# Patient Record
Sex: Female | Born: 1945
Health system: Southern US, Community
[De-identification: ages and names within clinical notes are randomized; demographics above are authoritative.]

## PROBLEM LIST (undated history)

## (undated) DIAGNOSIS — E785 Hyperlipidemia, unspecified: Secondary | ICD-10-CM

## (undated) DIAGNOSIS — Z972 Presence of dental prosthetic device (complete) (partial): Secondary | ICD-10-CM

## (undated) DIAGNOSIS — J449 Chronic obstructive pulmonary disease, unspecified: Secondary | ICD-10-CM

## (undated) DIAGNOSIS — S12400A Unspecified displaced fracture of fifth cervical vertebra, initial encounter for closed fracture: Secondary | ICD-10-CM

## (undated) DIAGNOSIS — F419 Anxiety disorder, unspecified: Secondary | ICD-10-CM

## (undated) DIAGNOSIS — Z9289 Personal history of other medical treatment: Secondary | ICD-10-CM

## (undated) DIAGNOSIS — S22000A Wedge compression fracture of unspecified thoracic vertebra, initial encounter for closed fracture: Secondary | ICD-10-CM

## (undated) DIAGNOSIS — Z789 Other specified health status: Secondary | ICD-10-CM

## (undated) DIAGNOSIS — Z8619 Personal history of other infectious and parasitic diseases: Secondary | ICD-10-CM

## (undated) DIAGNOSIS — W19XXXA Unspecified fall, initial encounter: Secondary | ICD-10-CM

## (undated) DIAGNOSIS — F329 Major depressive disorder, single episode, unspecified: Secondary | ICD-10-CM

## (undated) DIAGNOSIS — I639 Cerebral infarction, unspecified: Secondary | ICD-10-CM

## (undated) DIAGNOSIS — F17200 Nicotine dependence, unspecified, uncomplicated: Secondary | ICD-10-CM

## (undated) DIAGNOSIS — M545 Low back pain, unspecified: Secondary | ICD-10-CM

## (undated) DIAGNOSIS — I729 Aneurysm of unspecified site: Secondary | ICD-10-CM

## (undated) DIAGNOSIS — I5030 Unspecified diastolic (congestive) heart failure: Secondary | ICD-10-CM

## (undated) DIAGNOSIS — I6529 Occlusion and stenosis of unspecified carotid artery: Secondary | ICD-10-CM

## (undated) DIAGNOSIS — G629 Polyneuropathy, unspecified: Secondary | ICD-10-CM

## (undated) DIAGNOSIS — M810 Age-related osteoporosis without current pathological fracture: Secondary | ICD-10-CM

## (undated) DIAGNOSIS — F32A Depression, unspecified: Secondary | ICD-10-CM

## (undated) DIAGNOSIS — I509 Heart failure, unspecified: Secondary | ICD-10-CM

## (undated) DIAGNOSIS — M1990 Unspecified osteoarthritis, unspecified site: Secondary | ICD-10-CM

## (undated) DIAGNOSIS — I350 Nonrheumatic aortic (valve) stenosis: Secondary | ICD-10-CM

## (undated) DIAGNOSIS — I10 Essential (primary) hypertension: Secondary | ICD-10-CM

## (undated) DIAGNOSIS — E039 Hypothyroidism, unspecified: Secondary | ICD-10-CM

## (undated) DIAGNOSIS — S060X9A Concussion with loss of consciousness of unspecified duration, initial encounter: Secondary | ICD-10-CM

## (undated) DIAGNOSIS — I739 Peripheral vascular disease, unspecified: Secondary | ICD-10-CM

## (undated) HISTORY — DX: Anxiety disorder, unspecified: F41.9

## (undated) HISTORY — DX: Age-related osteoporosis without current pathological fracture: M81.0

## (undated) HISTORY — DX: Depression, unspecified: F32.A

## (undated) HISTORY — DX: Occlusion and stenosis of unspecified carotid artery: I65.29

## (undated) HISTORY — DX: Nicotine dependence, unspecified, uncomplicated: F17.200

## (undated) HISTORY — DX: Concussion with loss of consciousness of unspecified duration, initial encounter: S06.0X9A

## (undated) HISTORY — DX: Personal history of other infectious and parasitic diseases: Z86.19

## (undated) HISTORY — PX: CATARACT EXTRACTION: SUR2

## (undated) HISTORY — DX: Unspecified diastolic (congestive) heart failure: I50.30

## (undated) HISTORY — DX: Low back pain: M54.5

## (undated) HISTORY — DX: Low back pain, unspecified: M54.50

## (undated) HISTORY — DX: Personal history of other medical treatment: Z92.89

## (undated) HISTORY — DX: Unspecified displaced fracture of fifth cervical vertebra, initial encounter for closed fracture: S12.400A

## (undated) HISTORY — DX: Major depressive disorder, single episode, unspecified: F32.9

## (undated) HISTORY — DX: Unspecified fall, initial encounter: W19.XXXA

## (undated) HISTORY — DX: Unspecified osteoarthritis, unspecified site: M19.90

---
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
## 1968-09-26 HISTORY — PX: CHOLECYSTECTOMY: SHX55

## 2001-03-08 ENCOUNTER — Encounter: Payer: Self-pay | Admitting: Emergency Medicine

## 2001-03-08 ENCOUNTER — Inpatient Hospital Stay (HOSPITAL_COMMUNITY): Admission: EM | Admit: 2001-03-08 | Discharge: 2001-03-10 | Payer: Self-pay | Admitting: Emergency Medicine

## 2001-04-16 ENCOUNTER — Emergency Department (HOSPITAL_COMMUNITY): Admission: EM | Admit: 2001-04-16 | Discharge: 2001-04-16 | Payer: Self-pay | Admitting: Emergency Medicine

## 2001-04-16 ENCOUNTER — Encounter: Payer: Self-pay | Admitting: Emergency Medicine

## 2001-12-02 ENCOUNTER — Emergency Department (HOSPITAL_COMMUNITY): Admission: EM | Admit: 2001-12-02 | Discharge: 2001-12-02 | Payer: Self-pay | Admitting: Emergency Medicine

## 2001-12-02 ENCOUNTER — Encounter: Payer: Self-pay | Admitting: Emergency Medicine

## 2001-12-18 ENCOUNTER — Encounter: Admission: RE | Admit: 2001-12-18 | Discharge: 2002-03-18 | Payer: Self-pay | Admitting: Orthopedic Surgery

## 2002-09-26 HISTORY — PX: COLONOSCOPY: SHX174

## 2002-12-18 ENCOUNTER — Encounter: Admission: RE | Admit: 2002-12-18 | Discharge: 2002-12-18 | Payer: Self-pay | Admitting: Family Medicine

## 2002-12-18 ENCOUNTER — Encounter: Payer: Self-pay | Admitting: Family Medicine

## 2004-02-18 ENCOUNTER — Emergency Department (HOSPITAL_COMMUNITY): Admission: EM | Admit: 2004-02-18 | Discharge: 2004-02-18 | Payer: Self-pay | Admitting: Emergency Medicine

## 2004-06-16 ENCOUNTER — Emergency Department (HOSPITAL_COMMUNITY): Admission: EM | Admit: 2004-06-16 | Discharge: 2004-06-16 | Payer: Self-pay | Admitting: *Deleted

## 2004-06-30 ENCOUNTER — Encounter: Admission: RE | Admit: 2004-06-30 | Discharge: 2004-06-30 | Payer: Self-pay | Admitting: Family Medicine

## 2004-09-26 HISTORY — PX: HIP SURGERY: SHX245

## 2005-08-08 ENCOUNTER — Encounter: Admission: RE | Admit: 2005-08-08 | Discharge: 2005-08-08 | Payer: Self-pay | Admitting: Orthopedic Surgery

## 2005-08-23 ENCOUNTER — Ambulatory Visit (HOSPITAL_COMMUNITY): Admission: RE | Admit: 2005-08-23 | Discharge: 2005-08-24 | Payer: Self-pay | Admitting: Orthopedic Surgery

## 2005-12-14 ENCOUNTER — Encounter: Admission: RE | Admit: 2005-12-14 | Discharge: 2005-12-14 | Payer: Self-pay | Admitting: Orthopedic Surgery

## 2005-12-29 ENCOUNTER — Encounter: Admission: RE | Admit: 2005-12-29 | Discharge: 2005-12-29 | Payer: Self-pay | Admitting: Orthopedic Surgery

## 2006-01-13 ENCOUNTER — Encounter: Admission: RE | Admit: 2006-01-13 | Discharge: 2006-01-13 | Payer: Self-pay | Admitting: Orthopedic Surgery

## 2006-02-18 ENCOUNTER — Ambulatory Visit: Payer: Self-pay | Admitting: Family Medicine

## 2006-02-18 ENCOUNTER — Inpatient Hospital Stay (HOSPITAL_COMMUNITY): Admission: EM | Admit: 2006-02-18 | Discharge: 2006-02-21 | Payer: Self-pay | Admitting: Emergency Medicine

## 2006-02-19 ENCOUNTER — Encounter (INDEPENDENT_AMBULATORY_CARE_PROVIDER_SITE_OTHER): Payer: Self-pay | Admitting: Cardiology

## 2006-02-24 ENCOUNTER — Ambulatory Visit (HOSPITAL_COMMUNITY): Admission: RE | Admit: 2006-02-24 | Discharge: 2006-02-24 | Payer: Self-pay | Admitting: Cardiology

## 2006-02-24 ENCOUNTER — Ambulatory Visit: Payer: Self-pay | Admitting: Cardiology

## 2006-02-24 ENCOUNTER — Encounter: Payer: Self-pay | Admitting: Cardiology

## 2007-02-25 ENCOUNTER — Inpatient Hospital Stay (HOSPITAL_COMMUNITY): Admission: EM | Admit: 2007-02-25 | Discharge: 2007-03-01 | Payer: Self-pay | Admitting: Emergency Medicine

## 2007-02-25 ENCOUNTER — Ambulatory Visit: Payer: Self-pay | Admitting: Cardiology

## 2007-02-27 ENCOUNTER — Encounter (INDEPENDENT_AMBULATORY_CARE_PROVIDER_SITE_OTHER): Payer: Self-pay | Admitting: *Deleted

## 2007-02-27 ENCOUNTER — Ambulatory Visit: Payer: Self-pay | Admitting: Vascular Surgery

## 2007-11-24 ENCOUNTER — Emergency Department (HOSPITAL_COMMUNITY): Admission: EM | Admit: 2007-11-24 | Discharge: 2007-11-24 | Payer: Self-pay | Admitting: Family Medicine

## 2008-01-29 ENCOUNTER — Emergency Department (HOSPITAL_COMMUNITY): Admission: EM | Admit: 2008-01-29 | Discharge: 2008-01-29 | Payer: Self-pay | Admitting: Family Medicine

## 2008-09-26 DIAGNOSIS — I639 Cerebral infarction, unspecified: Secondary | ICD-10-CM

## 2008-09-26 HISTORY — DX: Cerebral infarction, unspecified: I63.9

## 2008-09-26 LAB — HM COLONOSCOPY: HM Colonoscopy: NORMAL

## 2008-09-30 ENCOUNTER — Ambulatory Visit: Payer: Self-pay | Admitting: Cardiovascular Disease

## 2008-09-30 ENCOUNTER — Inpatient Hospital Stay (HOSPITAL_COMMUNITY): Admission: EM | Admit: 2008-09-30 | Discharge: 2008-10-03 | Payer: Self-pay | Admitting: Emergency Medicine

## 2008-10-01 ENCOUNTER — Encounter (INDEPENDENT_AMBULATORY_CARE_PROVIDER_SITE_OTHER): Payer: Self-pay | Admitting: Internal Medicine

## 2008-10-03 ENCOUNTER — Ambulatory Visit: Payer: Self-pay | Admitting: Vascular Surgery

## 2008-10-09 ENCOUNTER — Encounter: Payer: Self-pay | Admitting: Interventional Radiology

## 2008-10-15 ENCOUNTER — Ambulatory Visit: Payer: Self-pay | Admitting: Cardiology

## 2008-10-16 ENCOUNTER — Ambulatory Visit: Payer: Self-pay

## 2008-11-05 ENCOUNTER — Inpatient Hospital Stay (HOSPITAL_COMMUNITY): Admission: RE | Admit: 2008-11-05 | Discharge: 2008-11-06 | Payer: Self-pay | Admitting: Interventional Radiology

## 2008-11-25 ENCOUNTER — Encounter: Payer: Self-pay | Admitting: Interventional Radiology

## 2009-05-07 ENCOUNTER — Ambulatory Visit (HOSPITAL_COMMUNITY): Admission: RE | Admit: 2009-05-07 | Discharge: 2009-05-07 | Payer: Self-pay | Admitting: Interventional Radiology

## 2009-06-05 ENCOUNTER — Emergency Department (HOSPITAL_COMMUNITY): Admission: EM | Admit: 2009-06-05 | Discharge: 2009-06-05 | Payer: Self-pay | Admitting: Emergency Medicine

## 2009-07-28 ENCOUNTER — Encounter (INDEPENDENT_AMBULATORY_CARE_PROVIDER_SITE_OTHER): Payer: Self-pay | Admitting: *Deleted

## 2009-07-29 ENCOUNTER — Emergency Department (HOSPITAL_COMMUNITY): Admission: EM | Admit: 2009-07-29 | Discharge: 2009-07-29 | Payer: Self-pay | Admitting: Emergency Medicine

## 2009-12-23 ENCOUNTER — Ambulatory Visit (HOSPITAL_COMMUNITY): Admission: RE | Admit: 2009-12-23 | Discharge: 2009-12-23 | Payer: Self-pay | Admitting: Interventional Radiology

## 2010-10-18 ENCOUNTER — Encounter: Payer: Self-pay | Admitting: Interventional Radiology

## 2010-11-25 DIAGNOSIS — M810 Age-related osteoporosis without current pathological fracture: Secondary | ICD-10-CM

## 2010-11-25 HISTORY — DX: Age-related osteoporosis without current pathological fracture: M81.0

## 2010-11-25 HISTORY — PX: OTHER SURGICAL HISTORY: SHX169

## 2010-12-17 LAB — BASIC METABOLIC PANEL
BUN: 18 mg/dL (ref 6–23)
CO2: 28 mEq/L (ref 19–32)
Calcium: 9.3 mg/dL (ref 8.4–10.5)
Chloride: 109 mEq/L (ref 96–112)
Creatinine, Ser: 0.88 mg/dL (ref 0.4–1.2)
GFR calc Af Amer: >60 mL/min (ref >60)
GFR calc non Af Amer: >60 mL/min (ref >60)
Glucose, Bld: 112 mg/dL — ABNORMAL HIGH (ref 70–99)
Potassium: 4.6 mEq/L (ref 3.5–5.1)
Sodium: 140 mEq/L (ref 135–145)

## 2010-12-17 LAB — CBC
HCT: 43.9 % (ref 36.0–46.0)
Hemoglobin: 14.9 g/dL (ref 12.0–15.0)
MCHC: 34 g/dL (ref 30.0–36.0)
MCV: 89.7 fL (ref 78.0–100.0)
Platelets: 159 10*3/uL (ref 150–400)
RBC: 4.89 MIL/uL (ref 3.87–5.11)
RDW: 13.4 % (ref 11.5–15.5)
WBC: 9.2 10*3/uL (ref 4.0–10.5)

## 2010-12-17 LAB — APTT: aPTT: 28 seconds (ref 24–37)

## 2010-12-17 LAB — PROTIME-INR
INR: 0.93 (ref 0.00–1.49)
Prothrombin Time: 12.4 seconds (ref 11.6–15.2)

## 2010-12-31 LAB — DIFFERENTIAL
Basophils Absolute: 0 10*3/uL (ref 0.0–0.1)
Basophils Relative: 1 % (ref 0–1)
Eosinophils Absolute: 0.2 10*3/uL (ref 0.0–0.7)
Eosinophils Relative: 2 % (ref 0–5)
Lymphocytes Relative: 33 % (ref 12–46)
Lymphs Abs: 2.5 10*3/uL (ref 0.7–4.0)
Monocytes Absolute: 0.6 10*3/uL (ref 0.1–1.0)
Monocytes Relative: 9 % (ref 3–12)
Neutro Abs: 4.2 10*3/uL (ref 1.7–7.7)
Neutrophils Relative %: 56 % (ref 43–77)

## 2010-12-31 LAB — CBC
HCT: 39.3 % (ref 36.0–46.0)
Hemoglobin: 13.4 g/dL (ref 12.0–15.0)
MCHC: 34.2 g/dL (ref 30.0–36.0)
MCV: 90.3 fL (ref 78.0–100.0)
Platelets: 130 10*3/uL — ABNORMAL LOW (ref 150–400)
RBC: 4.36 MIL/uL (ref 3.87–5.11)
RDW: 13.4 % (ref 11.5–15.5)
WBC: 7.5 10*3/uL (ref 4.0–10.5)

## 2010-12-31 LAB — BASIC METABOLIC PANEL
BUN: 15 mg/dL (ref 6–23)
CO2: 27 mEq/L (ref 19–32)
Calcium: 9.5 mg/dL (ref 8.4–10.5)
Chloride: 107 mEq/L (ref 96–112)
Creatinine, Ser: 0.85 mg/dL (ref 0.4–1.2)
GFR calc Af Amer: >60 mL/min (ref >60)
GFR calc non Af Amer: >60 mL/min (ref >60)
Glucose, Bld: 99 mg/dL (ref 70–99)
Potassium: 3.9 mEq/L (ref 3.5–5.1)
Sodium: 141 mEq/L (ref 135–145)

## 2011-01-01 LAB — BASIC METABOLIC PANEL
BUN: 22 mg/dL (ref 6–23)
CO2: 27 mEq/L (ref 19–32)
Calcium: 9 mg/dL (ref 8.4–10.5)
Chloride: 107 mEq/L (ref 96–112)
Creatinine, Ser: 0.89 mg/dL (ref 0.4–1.2)
GFR calc Af Amer: >60 mL/min (ref >60)
GFR calc non Af Amer: >60 mL/min (ref >60)
Glucose, Bld: 95 mg/dL (ref 70–99)
Potassium: 4.7 mEq/L (ref 3.5–5.1)
Sodium: 139 mEq/L (ref 135–145)

## 2011-01-01 LAB — CBC
HCT: 40.5 % (ref 36.0–46.0)
Hemoglobin: 14.1 g/dL (ref 12.0–15.0)
MCHC: 34.9 g/dL (ref 30.0–36.0)
MCV: 88 fL (ref 78.0–100.0)
Platelets: DECREASED 10*3/uL (ref 150–400)
RBC: 4.6 MIL/uL (ref 3.87–5.11)
RDW: 13 % (ref 11.5–15.5)
WBC: 8.9 10*3/uL (ref 4.0–10.5)

## 2011-01-01 LAB — APTT: aPTT: 22 seconds — ABNORMAL LOW (ref 24–37)

## 2011-01-01 LAB — PROTIME-INR
INR: 0.9 (ref 0.00–1.49)
Prothrombin Time: 12.4 seconds (ref 11.6–15.2)

## 2011-01-10 LAB — TROPONIN I
Troponin I: 0.01 ng/mL (ref 0.00–0.06)
Troponin I: 0.01 ng/mL (ref 0.00–0.06)
Troponin I: 0.01 ng/mL (ref 0.00–0.06)
Troponin I: 0.01 ng/mL (ref 0.00–0.06)

## 2011-01-10 LAB — PROTIME-INR
INR: 1 (ref 0.00–1.49)
Prothrombin Time: 13.8 seconds (ref 11.6–15.2)

## 2011-01-10 LAB — CK TOTAL AND CKMB (NOT AT ARMC)
CK, MB: 0.7 ng/mL (ref 0.3–4.0)
CK, MB: 0.9 ng/mL (ref 0.3–4.0)
CK, MB: 1 ng/mL (ref 0.3–4.0)
CK, MB: 1.1 ng/mL (ref 0.3–4.0)
Relative Index: INVALID (ref 0.0–2.5)
Relative Index: INVALID (ref 0.0–2.5)
Relative Index: INVALID (ref 0.0–2.5)
Relative Index: INVALID (ref 0.0–2.5)
Total CK: 39 U/L (ref 7–177)
Total CK: 47 U/L (ref 7–177)
Total CK: 48 U/L (ref 7–177)
Total CK: 78 U/L (ref 7–177)

## 2011-01-10 LAB — BASIC METABOLIC PANEL
BUN: 14 mg/dL (ref 6–23)
BUN: 22 mg/dL (ref 6–23)
CO2: 25 mEq/L (ref 19–32)
CO2: 28 mEq/L (ref 19–32)
Calcium: 8.6 mg/dL (ref 8.4–10.5)
Calcium: 9 mg/dL (ref 8.4–10.5)
Chloride: 103 mEq/L (ref 96–112)
Chloride: 107 mEq/L (ref 96–112)
Creatinine, Ser: 0.87 mg/dL (ref 0.4–1.2)
Creatinine, Ser: 0.88 mg/dL (ref 0.4–1.2)
GFR calc Af Amer: >60 mL/min (ref >60)
GFR calc Af Amer: >60 mL/min (ref >60)
GFR calc non Af Amer: >60 mL/min (ref >60)
GFR calc non Af Amer: >60 mL/min (ref >60)
Glucose, Bld: 115 mg/dL — ABNORMAL HIGH (ref 70–99)
Glucose, Bld: 67 mg/dL — ABNORMAL LOW (ref 70–99)
Potassium: 4.1 mEq/L (ref 3.5–5.1)
Potassium: 6.1 mEq/L — ABNORMAL HIGH (ref 3.5–5.1)
Sodium: 137 mEq/L (ref 135–145)
Sodium: 138 mEq/L (ref 135–145)

## 2011-01-10 LAB — VITAMIN B12: Vitamin B-12: 733 pg/mL (ref 211–911)

## 2011-01-10 LAB — DIFFERENTIAL
Band Neutrophils: 0 % (ref 0–10)
Basophils Absolute: 0.1 10*3/uL (ref 0.0–0.1)
Basophils Relative: 1 % (ref 0–1)
Blasts: 0 %
Eosinophils Absolute: 1.1 10*3/uL — ABNORMAL HIGH (ref 0.0–0.7)
Eosinophils Relative: 8 % — ABNORMAL HIGH (ref 0–5)
Lymphocytes Relative: 40 % (ref 12–46)
Lymphs Abs: 5.5 10*3/uL — ABNORMAL HIGH (ref 0.7–4.0)
Metamyelocytes Relative: 0 %
Monocytes Absolute: 0.3 10*3/uL (ref 0.1–1.0)
Monocytes Relative: 2 % — ABNORMAL LOW (ref 3–12)
Myelocytes: 0 %
Neutro Abs: 6.8 10*3/uL (ref 1.7–7.7)
Neutrophils Relative %: 49 % (ref 43–77)
Promyelocytes Absolute: 0 %
nRBC: 0 /100 WBC

## 2011-01-10 LAB — BRAIN NATRIURETIC PEPTIDE: Pro B Natriuretic peptide (BNP): 35 pg/mL (ref 0.0–100.0)

## 2011-01-10 LAB — HOMOCYSTEINE: Homocysteine: 11 umol/L (ref 4.0–15.4)

## 2011-01-10 LAB — TSH: TSH: 5.945 u[IU]/mL — ABNORMAL HIGH (ref 0.350–4.500)

## 2011-01-10 LAB — CBC
HCT: 45 % (ref 36.0–46.0)
Hemoglobin: 15.1 g/dL — ABNORMAL HIGH (ref 12.0–15.0)
MCHC: 33.5 g/dL (ref 30.0–36.0)
MCV: 90.5 fL (ref 78.0–100.0)
Platelets: 153 10*3/uL (ref 150–400)
RBC: 4.98 MIL/uL (ref 3.87–5.11)
RDW: 13.2 % (ref 11.5–15.5)
WBC: 13.8 10*3/uL — ABNORMAL HIGH (ref 4.0–10.5)

## 2011-01-10 LAB — LIPID PANEL
Cholesterol: 234 mg/dL — ABNORMAL HIGH (ref 0–200)
HDL: 41 mg/dL (ref >39)
LDL Cholesterol: 174 mg/dL — ABNORMAL HIGH (ref 0–99)
Total CHOL/HDL Ratio: 5.7 RATIO
Triglycerides: 94 mg/dL (ref <150)
VLDL: 19 mg/dL (ref 0–40)

## 2011-01-10 LAB — T3, FREE: T3, Free: 2.2 pg/mL — ABNORMAL LOW (ref 2.3–4.2)

## 2011-01-10 LAB — HEMOGLOBIN A1C
Hgb A1c MFr Bld: 5.6 % (ref 4.6–6.1)
Mean Plasma Glucose: 114 mg/dL

## 2011-01-10 LAB — APTT: aPTT: 32 seconds (ref 24–37)

## 2011-01-10 LAB — RPR: RPR Ser Ql: NONREACTIVE

## 2011-01-10 LAB — POTASSIUM: Potassium: 4 mEq/L (ref 3.5–5.1)

## 2011-01-11 LAB — HEPARIN LEVEL (UNFRACTIONATED)
Heparin Unfractionated: 0.37 IU/mL (ref 0.30–0.70)
Heparin Unfractionated: <0.1 IU/mL — ABNORMAL LOW (ref 0.30–0.70)

## 2011-01-11 LAB — BASIC METABOLIC PANEL
BUN: 10 mg/dL (ref 6–23)
BUN: 18 mg/dL (ref 6–23)
BUN: 20 mg/dL (ref 6–23)
CO2: 25 mEq/L (ref 19–32)
CO2: 26 mEq/L (ref 19–32)
CO2: 27 mEq/L (ref 19–32)
Calcium: 8.3 mg/dL — ABNORMAL LOW (ref 8.4–10.5)
Calcium: 8.8 mg/dL (ref 8.4–10.5)
Calcium: 9.2 mg/dL (ref 8.4–10.5)
Chloride: 105 mEq/L (ref 96–112)
Chloride: 107 mEq/L (ref 96–112)
Chloride: 107 mEq/L (ref 96–112)
Creatinine, Ser: 0.58 mg/dL (ref 0.4–1.2)
Creatinine, Ser: 0.77 mg/dL (ref 0.4–1.2)
Creatinine, Ser: 0.81 mg/dL (ref 0.4–1.2)
GFR calc Af Amer: >60 mL/min (ref >60)
GFR calc Af Amer: >60 mL/min (ref >60)
GFR calc Af Amer: >60 mL/min (ref >60)
GFR calc non Af Amer: >60 mL/min (ref >60)
GFR calc non Af Amer: >60 mL/min (ref >60)
GFR calc non Af Amer: >60 mL/min (ref >60)
Glucose, Bld: 118 mg/dL — ABNORMAL HIGH (ref 70–99)
Glucose, Bld: 97 mg/dL (ref 70–99)
Glucose, Bld: 98 mg/dL (ref 70–99)
Potassium: 3.6 mEq/L (ref 3.5–5.1)
Potassium: 4.2 mEq/L (ref 3.5–5.1)
Potassium: 5.2 mEq/L — ABNORMAL HIGH (ref 3.5–5.1)
Sodium: 136 mEq/L (ref 135–145)
Sodium: 137 mEq/L (ref 135–145)
Sodium: 138 mEq/L (ref 135–145)

## 2011-01-11 LAB — CBC
HCT: 33 % — ABNORMAL LOW (ref 36.0–46.0)
HCT: 39.7 % (ref 36.0–46.0)
Hemoglobin: 11.6 g/dL — ABNORMAL LOW (ref 12.0–15.0)
Hemoglobin: 13.6 g/dL (ref 12.0–15.0)
MCHC: 34.3 g/dL (ref 30.0–36.0)
MCHC: 35.2 g/dL (ref 30.0–36.0)
MCV: 90.7 fL (ref 78.0–100.0)
MCV: 90.8 fL (ref 78.0–100.0)
Platelets: 133 10*3/uL — ABNORMAL LOW (ref 150–400)
Platelets: 137 10*3/uL — ABNORMAL LOW (ref 150–400)
RBC: 3.64 MIL/uL — ABNORMAL LOW (ref 3.87–5.11)
RBC: 4.37 MIL/uL (ref 3.87–5.11)
RDW: 13.6 % (ref 11.5–15.5)
RDW: 14 % (ref 11.5–15.5)
WBC: 7.8 10*3/uL (ref 4.0–10.5)
WBC: 7.9 10*3/uL (ref 4.0–10.5)

## 2011-01-11 LAB — DIFFERENTIAL
Basophils Absolute: 0.1 10*3/uL (ref 0.0–0.1)
Basophils Relative: 2 % — ABNORMAL HIGH (ref 0–1)
Eosinophils Absolute: 0.3 10*3/uL (ref 0.0–0.7)
Eosinophils Relative: 4 % (ref 0–5)
Lymphocytes Relative: 30 % (ref 12–46)
Lymphs Abs: 2.3 10*3/uL (ref 0.7–4.0)
Monocytes Absolute: 0.6 10*3/uL (ref 0.1–1.0)
Monocytes Relative: 7 % (ref 3–12)
Neutro Abs: 4.4 10*3/uL (ref 1.7–7.7)
Neutrophils Relative %: 57 % (ref 43–77)

## 2011-01-11 LAB — APTT
aPTT: 122 seconds — ABNORMAL HIGH (ref 24–37)
aPTT: 36 seconds (ref 24–37)
aPTT: 46 seconds — ABNORMAL HIGH (ref 24–37)

## 2011-01-11 LAB — PROTIME-INR
INR: 1 (ref 0.00–1.49)
INR: 1.1 (ref 0.00–1.49)
Prothrombin Time: 13.2 seconds (ref 11.6–15.2)
Prothrombin Time: 14.3 seconds (ref 11.6–15.2)

## 2011-02-08 NOTE — Assessment & Plan Note (Signed)
[facility]                            CARDIOLOGY OFFICE NOTE   NAME:[name], [name] ZAUCHA                    MRN:          [id number]  DATE:10/15/2008                            DOB:          [birth year]    [name] [name] comes in today in consultation from Dr. Corliss Skains of  Interventional Radiology for preoperative clearance for a stent to be  placed in the right middle cerebral artery.   HISTORY OF PRESENT ILLNESS:  She is 65 years of age and has had a series  of strokes.  Her most recent stroke involved right upper extremity  weakness.  She had cerebral angiogram on October 02, 2008, which showed a  90-95% stenosis of the right middle cerebral artery.  She also had a 65%  stenosis of left internal carotid artery.  There was a 50% right  vertebral artery stenosis.   She was evaluated by Dr. Cari Caraway who felt she was not a surgical  candidate because of no significant extracranial stenosis.  She saw Dr.  Julieanne Cotton on October 09, 2008.  She has totally agreed to a  stent to the right middle cerebral artery, but she needs preoperative  clearance.   She denies any symptoms of angina, though she is fairly inactive.  She  has had no major problems of orthopnea, PND, or peripheral edema.  She  gives no previous cardiac history.  A 2-D echocardiogram performed in  the hospital showed EF of 60-65% with no significant wall motion  abnormality.  She also had a TEE, which showed no intracardiac source of  clot or shunting.  She does have mild aortic valve stenosis.  There was  no obvious vegetation.   PAST MEDICAL HISTORY:  Positive for hyperlipidemia, COPD, tobacco use,  which she does quit and medical noncompliance.   She has also had a history of anxiety and depression.  She had a stroke  in 2008 and also in 2007.   SURGICAL HISTORY:  Cholecystectomy, appendectomy, C-section, and left  hip surgery.   ALLERGIES:  No known drug allergies.   CURRENT MEDICATIONS:  1. Aggrenox 1 twice a day.  2. Nicotine patch.  3. Albuterol inhaler.  4. Prilosec p.r.n.  5. Aspirin 325 mg per day.  6. Simvastatin 80 mg per day.   SOCIAL HISTORY:  She is divorced.  She has 3 children.  She lives with  her son in [place].  She smoked 1-pack of cigarettes since she was  age 26.  She does not use alcohol.  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   FAMILY HISTORY:  Positive for coronary disease in the sister at a  premature age.  Her sister died following coronary artery bypass graft  surgery.   REVIEW OF SYSTEMS:  Other than HPI is negative.  She denies any bleeding  such as hematemesis, melena, or hematochezia.   PHYSICAL EXAMINATION:  GENERAL:  She is pleasant but somewhat drawn.  She is oriented x3.  VITAL SIGNS:  She is 170 pounds, her blood pressure is 156/87, pulse is  84 and regular.  HEENT:  Normocephalic, atraumatic.  PERRL.  Extraocular is intact.  Sclerae are clear.  Facial symmetry is normal.  Dentition need a repair.  NECK:  Supple.  Carotid upstrokes were equal bilaterally with a soft  left carotid bruit.  Thyroid is not enlarged.  Trachea is midline.  LUNGS:  Clear to auscultation and percussion.  HEART:  Nondisplaced PMI.  Normal S1 and S2.  No gallop.  ABDOMEN:  Soft, good bowel sounds.  No midline bruit.  No hepatomegaly.  EXTREMITIES:  No cyanosis, clubbing, or edema.  Pulses are intact, but  reduced.  NEURO:  Remarkable for some right-sided weakness.  Her gait is somewhat  impaired.  SKIN:  Few ecchymoses, otherwise benign.   EKG shows sinus rhythm with possible left atrial enlargement.  She has  some left ventricular hypertrophy.  Her rate is 85 beats per minute.   ASSESSMENT:  I suspect that [name] [name] has some degree of coronary  atherosclerosis.  Because of her limitations, it is difficult to assess  her for any symptomatic ischemia.   With her multiple risk factors and the need for an  intercerebral stent  procedure requiring general anesthesia, I think, it is wise to rule out  obstructive coronary artery disease.  In addition, her resting heart  rate is somewhat fast and I think she needs to be on beta blocker.   PLAN:  1. Begin metoprolol 25 mg per day as a start.  This may need to be up      titrated to a resting rate of 60 beats per minute.  2. Aggressive treatment of her other risk factors.  3. No smoking.  4. Adenosine rest stress Myoview.  If this is negative for ischemia,      we will clear for surgery.     Thomas C. Daleen Squibb, MD, [facility]  Electronically Signed   TCW/MedQ  DD: 10/15/2008  DT: 10/16/2008  Job #: [id number]   cc:   Sanjeev K. Corliss Skains, M.D.  Pramod P. Pearlean Brownie, MD

## 2011-02-08 NOTE — Consult Note (Signed)
NAME:  [name] [name], [name] [name]             ACCOUNT NO.:  [id number]   MEDICAL RECORD NO.:  [id number]          PATIENT TYPE:  OUT   LOCATION:  XRAY                         FACILITY:  [facility]   PHYSICIAN:  Sanjeev K. Deveshwar, M.D.DATE OF BIRTH:  [birth year]   DATE OF CONSULTATION:  DATE OF DISCHARGE:  10/09/2008                                 CONSULTATION   DATE OF EVALUATION:  October 09, 2008.   CHIEF COMPLAINT:  Left parietal cerebrovascular accident with  cerebrovascular disease.   HISTORY OF PRESENT ILLNESS:  This is a very pleasant 65 year old female  who was admitted to [facility] from September 30, 2008, to October 03, 2008, with a left CVA and associated right upper extremity weakness.  The patient had a cerebral angiogram performed on October 02, 2008, that  revealed a 90-95% stenosis of the right middle cerebral artery.  She had  a 65% stenosis of the left internal carotid artery at the cavernous  segment.  There was also a 50% right vertebral artery stenosis.  The  patient was evaluated for the SAMMPRIS trial; however, she was not felt  to be a candidate due to the degree of stenosis.  She was also evaluated  by Dr. Cari Caraway and was not felt to be a surgical candidate, as she  had no significant extracranial stenosis.  The patient presents today  accompanied by her granddaughter to be evaluated by Dr. Julieanne Cotton and to discuss possible treatment options.   Past medical history is significant for:  1. Hyperlipidemia.  2. Previous CVAs.  3. A history of COPD.  4. She has a history of medical noncompliance.  5. She has a history of tobacco use.  She did quit smoking at the time      of her stroke.  6. She had a 2-D echo performed recently that revealed an ejection      fraction of 60-65%.  7. She has a history of mild aortic valve stenosis.  8. Apparently, she has had a previous TEE.  We do not have the results      of that study.  9. She had a CVA in June  2008 as well as a CVA in 2007.  10.She has a history of anxiety and depression.  11.Fortunately, the patient has a little residual weakness from her      previous strokes.   Surgical history is significant for:  1. Cholecystectomy.  2. Appendectomy.  3. C-section.  4. Left hip surgery.  She denies any previous problems with anesthesia.   ALLERGIES:  She has no known drug allergies.   Medications include Aggrenox 1 twice daily, nicotine patches, albuterol  metered-dose inhaler, Prilosec, Zocor, and aspirin 81 mg daily.  The  patient had been on benazepril in the past; however, she is no longer on  this medication.   SOCIAL HISTORY:  The patient is divorced.  She has 3 children.  She  lives in [place] with her son.  She smoked 1 pack of cigarettes per  day since age 32.  She quit at the time of her recent  stroke.  She does  not use alcohol.  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   FAMILY HISTORY:  Her mother is alive at age 19.  She has heart disease,  COPD, and dementia.  Her father died at age 58 from COPD.  She has a  brother who died in a motor vehicle accident.  She has a sister who died  following coronary artery bypass graft surgery.   IMPRESSION AND PLAN:  As noted, this patient recently was admitted to  [facility] on September 30, 2008, to October 03, 2008, with a left  cerebrovascular accident and right upper extremity weakness.  She had a  cerebral angiogram revealing diffuse cerebrovascular disease as  documented above.  She was not a candidate for a SAMMPRIS trial.  She  was not felt to be a candidate for carotid endarterectomy.   The patient has been treated with Aggrenox; however, she did initially  have nausea and vomiting as well as a severe headache possibly  associated with this medication.  She does, however, continue with the  Aggrenox and aspirin at this time.  She states that her headaches have  been improving.   Dr.  Corliss Skains reviewed the results of her recent cerebral angiogram with  the patient and her granddaughter.  He pointed out the areas of  stenosis.  Stent-assisted angioplasty was discussed in detail including  the risks and benefits of the procedure.  The patient is very interested  in proceeding in order to prevent future strokes.  It was emphasized  that this intervention will not improve any deficits which the patient  has already experienced.   Due to the patient's cerebrovascular disease as well as her COPD, it was  recommended that she undergo cardiac clearance for general anesthesia  prior to proceeding with endovascular stent-assisted angioplasty.  The  patient has previously been seen by the [facility], and we  will make arrangements for a followup visit.  The patient was also given  a prescription for Plavix.  She was instructed to stop her Aggrenox 3  days prior to the intervention and to start Plavix in its place.  She  was to continue her aspirin on a daily basis.  We will try to schedule  her intervention sometime in February provided we can obtain cardiology  clearance.   Greater than 45 minutes was spent on this evaluation.      Delton See, P.A.    ______________________________  Grandville Silos. Corliss Skains, M.D.    DR/MEDQ  D:  10/10/2008  T:  10/11/2008  Job:  [id number]   cc:   Pramod P. Pearlean Brownie, MD  Di Kindle. Edilia Bo, M.D.  [facility]

## 2011-02-08 NOTE — Discharge Summary (Signed)
NAME:  [name] [name], [name] [name]             ACCOUNT NO.:  [id number]   MEDICAL RECORD NO.:  [id number]          PATIENT TYPE:  OIB   LOCATION:  3105                         FACILITY:  [facility]   PHYSICIAN:  Sanjeev K. Deveshwar, M.D.DATE OF BIRTH:  30-Jan-1946   DATE OF ADMISSION:  11/05/2008  DATE OF DISCHARGE:  11/06/2008                               DISCHARGE SUMMARY   CHIEF COMPLAINT:  Cerebrovascular disease.   HISTORY OF PRESENT ILLNESS:  This is a pleasant 65 year old female, who  was admitted to [facility] from September 30, 2008 to October 03, 2008, with a left CVA and associated right-sided weakness.  The patient  had a cerebral angiogram performed by Dr. Corliss Skains on October 02, 2008  that showed a 90-95% stenosis of the right middle cerebral artery.  She  also had a 65% stenosis of the left internal carotid artery at the  cavernous segment.  There was a 50% right vertebral artery stenosis as  well.   The patient was evaluated for the SAMMPRIS trial, however, she was not  felt to be a candidate due to the degree of stenosis.  She was also  evaluated by Dr. Waverly Ferrari, and was not felt to be a surgical  candidate as she had no significant extracranial stenosis.  The patient  was seen in consultation by Dr. Corliss Skains on October 09, 2008, stent-  assisted angioplasty was discussed along with the option of further  monitoring and a decision was made to proceed with intervention.  The  patient was admitted to [facility] on November 05, 2008, for  intervention.   PAST MEDICAL HISTORY:  The patient has no known coronary artery disease,  although Dr. Corliss Skains did recommend a surgical clearance from  Cardiology prior to intervening.  The patient was seen recently by Dr.  Daleen Squibb from [facility] for that clearance.  I believe she had a  stress test and she was started on a beta-blocker secondary to an  elevated heart rate.   The patient also has a history of  hyperlipidemia.  She has had previous  CVAs.  She has COPD.  She has a history of medical noncompliance.  There  was a previous history of tobacco use, although she did quit smoking at  the time of her stroke.  She had a 2-D echo performed during her stroke  admission that showed an ejection fraction of 60-65%.  She was noted to  have mild aortic valve stenosis.  I believe she also had a TEE, although  we do not have results of that study.  She had a CVA in June 2008, as  well as a CVA in 2007.  She has a history of anxiety and depression.  The patient has very little residual weakness from her previous strokes.   SURGICAL HISTORY:  Significant for cholecystectomy, appendectomy, C-  section, and left hip surgery.  The patient denies any previous problems  with anesthesia.   ALLERGIES:  The patient has no known drug allergies.   MEDICATIONS AT THE TIME OF ADMISSION:  1. Aspirin 325 mg daily.  2. Plavix  75 mg daily, which was started in anticipation of stent      placement.  3. The patient had previously been on Aggrenox.  4. She is also on Ventolin.  5. Toprol-XL 25 mg daily.  6. Zocor 80 mg daily.  7. She had been on Lotensin in the past, but was not on this      medication at the time of admission to the hospital.   SOCIAL HISTORY:  The patient is divorced.  She has 3 children.  She  lives in [place] with her son.  She smoked a pack of cigarettes per  day since age 89.  She quit at the time of her recent stroke.  She does  not use alcohol.  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   FAMILY HISTORY:  Her mother is alive at age 67.  She has heart disease,  COPD, and dementia.  Her father died at age 62 from COPD.  She has a  brother who died in a motor vehicle accident.  She has a sister who died  following coronary artery bypass graft surgery.   HOSPITAL COURSE:  The patient was admitted to [facility] on  November 05, 2008, for further treatment  of cerebrovascular disease  following a recent left CVA.  The patient did undergo angioplasty of the  left internal carotid artery in the cavernous segment under general  anesthesia on the day of admission performed by Dr. Corliss Skains.  There  were no known or immediate complications.   Following the procedure, the patient was admitted to the Neuro Intensive  Care Unit per protocol.  She was maintained on IV heparin overnight.  The heparin was discontinued the following day and the right femoral  groin sheath was removed.  The patient had some bradycardia and her  metoprolol was held.  She also had some elevated blood pressures and  received Apresoline for her blood pressure.  Her pressure continued to  be elevated.  She had been on Lotensin in the past and she was given  Lotensin 10 mg p.o. on the day of discharge with a prescription for  Lotensin as well.   The patient is currently on bedrest following removal of her right  femoral groin sheath.  At the end of 6 hours of bedrest, she will be  ambulated if she remains stable, she will be discharged in improved  condition.   LABORATORY DATA ON THE DAY OF DISCHARGE:  A basic metabolic panel  revealed a BUN of 10, creatinine 0.58, her GFR was greater than 60,  potassium was 3.6, and her glucose was 118.  A CBC on the day of  discharge revealed hemoglobin 11.6, hematocrit 33, WBCs 7900, and  platelets of 133,000.  On October 31, 2008, BUN had been 18, creatinine  0.81, GFR was greater than 60, and potassium was 5.2.  A repeat  potassium level on November 05, 2008, was 4.2.  A CBC on October 31, 2008, revealed hemoglobin 13.6, hematocrit 39.7, WBCs 7800, and  platelets 137,000.   DISCHARGE INSTRUCTIONS:  The patient was told to resume her previous  medications.  She was to remain on Plavix 75 mg daily.  She was to take  aspirin 81 mg daily.  She was told not to restart her Aggrenox.  She was  to decrease her metoprolol dose to one-half  tablet daily.  She was to  resume Lotensin 10 mg daily.   The  patient was told to stay on a low-fat, low-cholesterol diet.  She  was given instructions regarding wound care.  She was told not to do  anything strenuous including driving for at least 2 weeks.   A followup angiogram was recommended in 3 months.  The patient will see  Dr. Corliss Skains on November 25, 2008, at 2 p.m. for a followup visit.  She was  also instructed to follow up with the [facility] in 1-2 weeks for further evaluation of her hypertension, heart  rate, and to have a basic metabolic panel after resuming Lotensin.  She  had previously had some hyperkalemia, which may or may not have been  related to the Lotensin.  The patient was told to see Dr. Pearlean Brownie as  scheduled or as instructed.   DISCHARGE DIAGNOSES:  1. Status post angioplasty of the left internal carotid artery      cavernous portion performed on November 05, 2008, under general      anesthesia by Dr. Corliss Skains.  2. History of multiple cerebrovascular accidents with a left      cerebrovascular accident in early January 2010.  3. Mild anemia following her intervention.  4. Residual cerebrovascular disease as documented by a cerebral      angiogram performed on October 02, 2008.  5. Hyperlipidemia.  6. History of chronic obstructive pulmonary disease.  7. History of medical noncompliance.  8. History of tobacco use.  9. A 2-D echo revealing an ejection fraction of 60-65% with mild      aortic stenosis.  10.History of anxiety and depression.  11.Status post multiple surgeries.  12.Mild bradycardia, which was asymptomatic during this admission, but      prompted a decrease in her metoprolol dose.  13.Mild hypertension this admission with resumption of Lotensin with a      followup basic metabolic panel and blood pressure check recommended      in 1-2 weeks.  14.Mild anemia postprocedurally felt to be related to fluid  hydration.      Delton See, P.A.    ______________________________  Grandville Silos. Corliss Skains, M.D.    DR/MEDQ  D:  11/06/2008  T:  11/07/2008  Job:  [id number]   cc:   Pramod P. Pearlean Brownie, MD  [facility]  Jesse Sans. Daleen Squibb, MD, [facility]  Di Kindle. Edilia Bo, M.D.

## 2011-02-08 NOTE — H&P (Signed)
NAME:  [name] [name], [name] [name]             ACCOUNT NO.:  [id number]   MEDICAL RECORD NO.:  [id number]          PATIENT TYPE:  EMS   LOCATION:  MAJO                         FACILITY:  [facility]   PHYSICIAN:  Hettie Holstein, D.O.    DATE OF BIRTH:  1946-09-08   DATE OF ADMISSION:  02/25/2007  DATE OF DISCHARGE:                              HISTORY & PHYSICAL   PRIMARY CARE PHYSICIAN:  [employer].   CHIEF COMPLAINT:  Numbness and dizziness.   HISTORY OF PRESENT ILLNESS:  [name] [name] is a pleasant 65 year old  female with a known history of cerebrovascular disease, sustaining a  stroke back in Jan 31, 2006 when she was seen in consultation by Dr.  Porfirio Mylar Dohmeier.  This stroke involved her left side as well and by MRI  at that time it appeared to be multifocal and this appeared to be  multifocal with areas of cortical and some cortical involvement with  there largest area of involvement in the right posterior frontal Gyrus  and adjacent subcortical white matter. At that time she had some speech  deficit that she completely has rehabilitated and carries no residual  deficits.  She had undergone transesophageal echocardiogram that did not  reveal an embolic source at that time.  She had been placed on aspirin,  underwent physical therapy and has at baseline been ambulatory without a  walker and no residual deficits.   Yesterday around 2 p.m. she was standing in the kitchen speaking with  her son when she developed some left arm numbness and as well facial  numbness.  She described a para oral numbness.  This was not confined to  only one side. She rested on the couch.  This  persisted. She notified  her niece that she was having hot flash and did not seek medical help.  In any event she presented to the emergency department today at the  prompting of her daughter at which time she continued to complain of  slight numbness.  There was no weakness.  She said that at one point  she  did have some heaviness but this has resolved per Dr. Alden Server exam  as well as my exam.  She exhibited no focal neurologic deficits in the  emergency department and was alert and oriented.  Her initial CT scan  was unremarkable and her rhythm on telemetry was sinus.  She is being  admitted for further evaluation.   PAST MEDICAL HISTORY:  Significant for:  1. Coronary artery disease.  She states that she underwent cardiac      catheterization approximately 6 years ago by Dr. Willa Rough.  The      transcription on E-chart is not completely clear at it has Dr.      Caryl Never as performing a cardiac catheterization.  In any event,      this report reveals only noncritical disease, 20% in the main as      well as other segments and in this regard she has been      asymptomatic. Her echocardiogram revealed preserved LV function as  well.  2. She is status post a hip pinning in November 2006 by Dr. Chaney Malling      due to hip fracture.  3. She is status post cholecystectomy and appendectomy.  4. History of hyperlipidemia.  5. Tobacco abuse which at some point she was abstinent.  However, she      has resumed.  6. History of depression.  7. Chronic headache.   MEDICATIONS:  Medications that she can recall is provided in the  emergency department:  1. Zocor 40 mg daily.  2. Restoril 15 mg at bedtime p.r.n.  3. Benazepril 20 mg p.o. daily.  4. Baby aspirin 81 mg daily.  5. Lexapro.  She does not recall the dose.   She uses [facility] in [place] who can be contacted for further  medical reconciliation in the a.m.   ALLERGIES:  No known drug allergies.   REVIEW OF SYSTEMS:  She has been under a great deal of stress due to  financial issues.  She has chronic shortness of breath.  She states that  she ascends stairs with difficulty.  However, this has been for quite  sometime.  She has no swelling of her lower extremities.  No GI  symptomatology including nausea,  vomiting, diarrhea, hematemesis, or  hematochezia.  No coffee-ground emesis.  No vaginal discharge. Full  review of systems is unremarkable otherwise.,   PHYSICAL EXAMINATION:  VITAL SIGNS:  Blood pressure 167/93, heart rate  87, respirations 18, O2 saturation 96%.  GENERAL:  In the emergency department she was alert and oriented, in no  acute distress, quite pleasant, following commands.  HEENT:  Revealed  head to be normocephalic, atraumatic.  Extraocular muscles were intact.  Her peripheral vision was fully intact on  gross examination of her  visual fields.  NECK:  Supple and nontender.  CARDIOVASCULAR:  Exam revealed normal S1 and  S2 without appreciable  murmur.  LUNGS:  Clear.  She exhibited normal effort.  No dullness to percussion.  ABDOMEN:  Soft and nontender without rebound or guarding.  EXTREMITIES:  no edema.  NEUROLOGIC:  Her strength proximally and distally was 5/5 and cerebellar  examination revealed no deficit.  She only had a minor difficulty with  tracking of her left.  Otherwise her neurologic exam was intact.   LABORATORY DATA:  Her INR was 1.  WBC 9, hemoglobin 49.7, platelet count  143.  Sodium 139, potassium 42, BUN 80, creatinine 0.9, glucose 97.  EKG  revealed normal sinus rhythm.  INR is 1.   ASSESSMENT:  1. Stroke versus transient ischemic attack.  2. Coronary artery disease stable.  3. Hypercholesterolemia.  4. Tobacco abuse.  5. History of cerebrovascular disease.   PLAN:  At this time we will watch her on telemetry, rule out acute event  via MRI and follow clinical course.  Implement stroke workup.      Hettie Holstein, D.O.  Electronically Signed     ESS/MEDQ  D:  02/25/2007  T:  02/26/2007  Job:  [id number]   cc:   Olena Leatherwood [facility]

## 2011-02-08 NOTE — Discharge Summary (Signed)
NAME:  [name] [name], [name] [name]             ACCOUNT NO.:  [id number]   MEDICAL RECORD NO.:  [id number]          PATIENT TYPE:  INP   LOCATION:  3705                         FACILITY:  [facility]   PHYSICIAN:  Lonia Blood, M.D.      DATE OF BIRTH:  10/20/1945   DATE OF ADMISSION:  02/25/2007  DATE OF DISCHARGE:  03/01/2007                               DISCHARGE SUMMARY   PRIMARY CARE PHYSICIAN:  [employer].   DISCHARGE DIAGNOSES:  1. Cerebrovascular accident involving the right side of the brain,      probably embolic, mainly distal.  Also, possibly distal internal      carotid artery stenosis.  2. Hypertension.  3  Coronary artery disease.  1. Dyslipidemia.  2. Tobacco abuse.   DISCHARGE MEDICATIONS:  1. Aggrenox 1 capsule twice a day.  2. Lotensin 20 mg daily.  3. Lopressor 25 mg p.o. b.i.d.  4. Zocor 80 mg p.o. nightly.  5. Baby aspirin 81 mg daily for 2 weeks.  6. Lexapro as previously taken at home.   DISPOSITION:  Patient will be discharged home.  She will need a bubble  study at [facility].  She has been given the number  to call and that is [phone].  She will call that number for  appointment.   PROCEDURES PERFORMED:  1. Head CT without contrast on June 1 showed stable appearance of the      brain with subcortical white matter disease bilaterally, but no CT      evidence of acute stroke or hemorrhage.  Acute stroke may be occult      during the first 24 hours obviously.  So,  MRI of the brain was      performed on June 2 that showed 2 small subcentimeter focal areas      of acute ischemia involving the left posterior temporoparietal      subcortical white matter and the left parieto-occipital subcortical      white matter.  Also, simple-appearing small vessel-type changes,      diffuse atrophy, minimal probable inflammatory mucosal thickening      in the ethmoids, frontal sinuses and maxillary sinuses; possible      inflammatory  thickening versus retained secretions in the mastoids      bilaterally.  MRA on June 3 showed focal area of caliber      irregularity with significant decreased caliber along the left      internal carotid artery  in the cavernous and distal segments.      Possible scattered arteriosclerotic changes involving the anterior      circulation.  2. Carotid Doppler performed on June 3 showed no ICA stenosis.  3. TTE performed on June 1 showed normal left ventricular function.      No visible atheroma.  Mild aortic valvular regurg.  4. A TEE performed on June 3 showed no obvious thrombus with an EF 60-      65%, mildly calcified aortic valve.  Moderately extensive, moderate      sessile atheroma of the ascending aorta with no ulceration, no  associated thrombosis, extensive moderate sessile atheroma of the      descending aorta, also with no ulceration and no associated      thrombosis.  No intracardiac shunt was also detected.   CONSULTATIONS:  [facility] and stroke  center.   BRIEF HISTORY AND PHYSICAL:  Please refer to dictated history and  physical by Dr. Hannah Beat.  In short, however, this is a 65 year old  female that presented with left-sided numbness and dizziness on the day  of admission.  She has had been previous history of cerebrovascular  accident and was being seen by Dr. Vickey Huger.  But, on the day of  admission, she experienced symptoms while speaking and standing in the  kitchen.  She also had perioral numbness associated with that.  But,  most of the symptoms have resolved by the time she was seen in the  emergency room.  She was subsequently admitted with a diagnosis of  possible TIA.   HOSPITAL COURSE:  1. CVA:  As indicated above, patient's workup later turned out to be      an acute stroke.  She was followed by neurology.  She was initially      on aspirin and heparin.  Currently, she has been switched to oral      Aggrenox.  So far,  preliminary studies did not indicate any source      of emboli, but her MRI is consistent with what appears to be shower      emboli.  She will have outpatient bubble study to further      characterize her symptoms and see if she has any patent foramen      ovale.  Patient has, also, PT/OT and we will continue with that as      an outpatient.  She will follow with Dr.  Pearlean Brownie in his office.  2. Hypertension:  Blood pressure is controlled on her current      medications.  3. Coronary artery disease:  This is also stable.  No chest pain and      no elevated cardiac enzymes.  4. Dyslipidemia:  Patient was maintained on statin, but the dose of      her Zocor was increased during this admission.  5. Tobacco abuse:  Again, patient has been counseled and she plans to      quit.  Support was given to patient in this regard.      Lonia Blood, M.D.  Electronically Signed     LG/MEDQ  D:  03/01/2007  T:  03/01/2007  Job:  [id number]

## 2011-02-08 NOTE — H&P (Signed)
NAME:  [name] [name], [name] [name]             ACCOUNT NO.:  [id number]   MEDICAL RECORD NO.:  [id number]          PATIENT TYPE:  OIB   LOCATION:  3105                         FACILITY:  [facility]   PHYSICIAN:  Sanjeev K. Deveshwar, M.D.DATE OF BIRTH:  [birth year]   DATE OF ADMISSION:  11/05/2008  DATE OF DISCHARGE:                              HISTORY & PHYSICAL   [name] is a 65 year old female who has had a recent left CVA.  She  had a consultation with Dr. Corliss Skains on October 09, 2008 and an  angiogram on October 02, 2008 which showed a left internal carotid artery  stenosis.  The CVA has left the patient with right extremity weakness,  although this has been resolving at this point.  She is scheduled today  for an angiogram and probable angioplasty and/or stent placement with  Dr. Corliss Skains.  The patient and daughter understand the procedure,  benefits, and risks and are agreeable to proceed.  Her labs have been  checked.  She has no symptoms today that she is concerned about nor  complaining of.  She is a nonsmoker and she quit 1 month ago.   ALLERGIES:  None.   MEDICATIONS:  1. Plavix.  2. Simvastatin.  3. Aspirin.  4. Metoprolol.  5. Pro-Air.   PAST MEDICAL HISTORY:  1. High lipids.  2. CVA x3.  3. COPD.  4. Aortic valve stenosis.   SURGICAL HISTORY:  1. Cholecystectomy.  2. Appendectomy.  3. C-section.  4. Left hip replacement.   OBJECTIVE:  GENERAL:  The patient has EOMI and alert and oriented.  Her  face is symmetrical.  She puffed her cheeks equally bilaterally.  EXTREMITIES:  Full range of motion and her gait is steady but slow.  Her  right side is still slightly weak on both upper and lower extremities,  but very slight.  HEART:  Regular rate and rhythm without murmur.  LUNGS:  Clear to auscultation.   ASSESSMENT:  Left internal carotid artery stenosis.   PLAN:  Arteriogram with probable angioplasty and/or stent placement with  Dr. Corliss Skains as it is scheduled  for today.      [place] Cellar Carleene Mains, P.A.    ______________________________  [place] Silos Corliss Skains, M.D.    PAT/MEDQ  D:  11/05/2008  T:  11/05/2008  Job:  [id number]

## 2011-02-08 NOTE — Consult Note (Signed)
NAME:  MAKAIAH, TERWILLIGER             ACCOUNT NO.:  [id number]   MEDICAL RECORD NO.:  [id number]          PATIENT TYPE:  INP   LOCATION:  3736                         FACILITY:  [facility]   PHYSICIAN:  Marlan Palau, M.D.  DATE OF BIRTH:  June 19, 1946   DATE OF CONSULTATION:  09/30/2008  DATE OF DISCHARGE:                                 CONSULTATION   HISTORY OF PRESENT ILLNESS:  [name] [name] is a 65 year old right-  handed white female born 01-12-1946 with a history of  hypertension, cerebrovascular disease, coronary artery disease and  ongoing tobacco abuse.  This patient has been seen previously in June  2008 with stroke event was placed on Aggrenox.  The patient claims that  she has stopped the Aggrenox at some point but does not remember exactly  when but knows has been more than a month ago.  This patient continues  to smoke a pack of cigarettes daily.  Two days prior to this admission,  the patient woke up with some problems with clumsiness of the right  hand.  Upon description, it appears that the patient had apraxia with  use of the right hand, could not figure out how to use scissors or tie  her shoes with right hand.  The patient did note some mild gait  instability with no falls, some mild nausea, headache, dizziness and  blurred vision.  The patient has had some gradual worsening of the  deficit since its onset and comes to the emergency room for evaluation.  CT scan of the brain done shows evidence of a subacute infarct to the  left frontal area.  Neurology was asked to see this patient for further  evaluation.  NIH Stroke Scale score is 2.   PAST MEDICAL HISTORY:  Significant for:  1. New onset of left frontal stroke 2 days prior to admission, onset      last seen normal the evening 3 days prior to admission.  2. Medical noncompliance.  The patient has stopped her Aggrenox.  3. Hypertension.  4. Dyslipidemia.  5. Left hip fracture with pinning procedure in the  past.  6. Gallbladder surgery.  7. Appendectomy.  8. Stroke in June 2008.  9. Coronary artery disease.  10.History of depression.  11.COPD.   MEDICATIONS:  1. Benazepril 20 mg daily.  2. Simvastatin 80 mg daily.  3. Aggrenox 1 twice daily.  4. The patient was not taking metoprolol 25 mg twice daily.   The patient smokes a pack of cigarettes daily.  Does not drink alcohol.   Has no known allergies.   SOCIAL HISTORY:  This patient is divorced, [place], has four children, one son died with an MI in his  late 65s, at least one daughter has significant obesity issues.  The  patient is on disability is not working.   FAMILY MEDICAL HISTORY:  Notable that father died with an MI at young  age.  Mother is still living has heart disease MI in her 25s.  The  patient has one brother died following motor vehicle accident, one  sister died with heart disease.   REVIEW OF SYSTEMS:  Notable for no recent fevers or chills.  The patient  does note headache, does note some left-sided neck pain, does note some  chronic shortness of breath, bronchitis.  Denies chest pains, has had  some nausea for 2 days.  Denies any problems controlling the bowels or  bladder, has not had any blackout episodes.  Does complain of some  dizziness.   PHYSICAL EXAMINATION:  VITALS:  Blood pressure is 142/82, heart rate 81,  respiratory rate 16, temperature afebrile.  GENERAL:  This patient is a fairly well-developed white female who is  alert and cooperative at the time of examination.  HEENT:  Head is atraumatic.  Eyes:  Pupils are equal, round and reactive  to light.  Disks are flat bilaterally.  NECK:  Supple.  No carotid bruits noted.  RESPIRATORY:  Clear.  CARDIOVASCULAR:  Distant heart sounds with no obvious murmurs, rubs  noted.  EXTREMITIES:  Without significant edema.  NEUROLOGIC:  Cranial nerves as above.  Facial symmetry is present.  The  patient has a good  sensation of face to pinprick, soft touch  bilaterally.  Has good strength of facial muscles and the muscles of  head turn and shoulder shrug bilaterally.  Speech is well enunciated not  aphasic.  Extraocular movements are full.  Visual fields are full.  Motor testing reveals good strength direct testing in all four  extremities, good symmetric motor tone is noted throughout.  Mild drift  is noted with the right lower extremity not present on the left.  No  drift seen in the upper extremities.  The patient has good finger-nose-  finger, heel-to-shin.  Gait was not tested.  Deep tendon reflexes  symmetric, normal toes downgoing bilaterally.  There is some extinction  with double simultaneous stimulation with the left leg as compared to  the right, decreased pinprick sensation on the left arm and legs.  The  right vibratory sensation is relatively intact on all fours.   Once again NIH Stroke Scale score is 2.   LABORATORY VALUES:  Notable for white count of 13.8, hemoglobin 15.1,  hematocrit 45.0, MCV of 90.5, platelets of 153.  The patient has 8%  eosinophils.  Sodium 138, potassium initially was 6.1, repeat was 4.0,  chloride of 103, CO2 28, glucose of 67, BUN of 22, creatinine 0.8,  calcium 9.0.   CT of the head is as above.   IMPRESSION:  1. History of cerebrovascular disease with recurrent stroke involving      the left frontal area by CT.  2. Medical noncompliance.  The patient has stopped Aggrenox.  3. Tobacco abuse.  4. Hypertension.  5. Dyslipidemia.   This patient has had modifiable risk factors for stroke.  The patient  has gone off for Aggrenox has continued to smoke.  The patient will need  education regarding these factors.  We will pursue a bit further workup  at this point. Patient is not a TPA candidate due to minimal deficit and  duration of symptoms.   PLAN:  1. MRI scan of the brain.  2. MRI angiogram of the intracranial vessels.  3. Carotid Doppler study.   4. Two-D echocardiogram.  5. Restart Aggrenox 1 tablet twice daily.  6. Smoking cessation.  We will follow the patient's clinical course      while in-house.   Thank you very much.      Marlan Palau, M.D.  Electronically Signed  CKW/MEDQ  D:  09/30/2008  T:  10/01/2008  Job:  [id number]   cc:   [facility]  Lonia Blood, M.D.

## 2011-02-08 NOTE — Consult Note (Signed)
NAME:  [name] [name], [name] [name]             ACCOUNT NO.:  [id number]   MEDICAL RECORD NO.:  [id number]          [name] [name] TYPE:  INP   LOCATION:  3705                         FACILITY:  [facility]   PHYSICIAN:  Bevelyn Buckles. Champey, M.D.DATE OF BIRTH:  10-May-1946   DATE OF CONSULTATION:  DATE OF DISCHARGE:                                 CONSULTATION   .   REASON FOR CONSULTATION:  Stroke.   HISTORY OF PRESENT ILLNESS:  [name] [name] is a 65 year old Caucasian  female with multiple medical problems who presented yesterday after one  day history of left face greater than the arm, greater than leg  numbness, and her left side feeling heavy.  The [name] [name] states her  symptoms have gradually improved and resolved.  However, she is still  having some slight left facial numbness.  She denies any other symptoms  of headaches, vision changes, speech changes, swallowing problems,  tremor problems, vertigo, falls or loss of consciousness.  The [name] [name]  did state she felt slightly lightheaded yesterday and faint on Saturday  with her symptoms.  She is prior history of stroke in 2007 with left-  sided symptoms and acute right infarct on MRI for which she was started  on aspirin.   PAST MEDICAL HISTORY:  1. CAD.  2. Stroke.  3. Hyperlipidemia.  4. Depression.  5. Headache.   CURRENT MEDICATIONS:  1. Lovenox.  2. Aspirin.  3. Zocor.  4. Lotensin.  5. Pressor.   ALLERGIES:  The [name] [name] has no known drug allergies   SOCIAL HISTORY:  The [name] [name] smokes a half-pack to one pack per day.  Denies alcohol use.  Lives with her mother.   FAMILY HISTORY:  Positive for heart disease.   REVIEW OF SYSTEMS:  Positive as per HPI.  Review of systems negative as  per HPI and greater than seven other organ systems.   PHYSICAL EXAMINATION:  VITALS:  Temperature is 97.3, pulse 63,  respirations 20, blood pressure 130/78, O2 sat 97%.  HEENT:  Normocephalic, atraumatic.  Extraocular muscles intact.  Visual  fields are  intact.  NECK:  Supple.  HEART:  Regular.  LUNGS:  Clear.  ABDOMEN:  Soft.  EXTREMITIES:  Show good pulses.  NEUROLOGICAL:  The [name] [name] is awake, alert.  Language is fluent.  The  [name] [name] is following commands appropriately.  Cranial nerves:  The  [name] [name] has decreased sensation in the left side of her face.  The rest  of her cranial nerves II-XII grossly intact.  Motor examination shows  good strength in all four extremities.  No drift is noted.  Sensory  examination:  The [name] [name] has decreased sensation on the left to light  touch when compared to the right.  Reflexes are 1-2+ throughout and  symmetric.  Toes are neutral to downgoing bilaterally.  Cerebellar  function is within normal limits finger-to-nose.  Gait was not assessed  secondary to safety.   LABORATORY DATA:  CBC and basic metabolic panel were normal.  Coags were  normal.  Cholesterol 273, LDL of 193, hemoglobin A1c is 97.  TSH  13.4145.  CT head showed stable small  vessel disease, nothing acute.  MRI of the brain showed small left parietal and left temporal infarcts.  TEE done on February 24, 2006, showed mild systolic function and mild aortic  atheroma.  In the descending aorta there is no left atrial thrombus  noted.   IMPRESSION:  This is a 65 year old Caucasian female with multiple  medical problems who presents with left-sided numbness and new infarcts  on her left.  I have reviewed the past and recent MRI scan.  The new  infarct does not explain the [name] [name]'s current symptoms.  [name] [name] is  with history of bilateral infarcts.  Concerned with bilateral infarcts  and TEE one year ago with mild to moderate atheroma, that could be a  possible embolic source.  I will start the [name] [name] on IV heparin stroke  protocol.  Include stroke workup with an MRA of the brain, carotid  Dopplers, have cold cardiology to repeat her TEE, and also check  homocysteine level.  If her workup is negative for an embolic source  would  recommend changing her aspirin to Plavix.  I will discontinue her  aspirin for now secondary to being on heparin.  Will get PT/OT consults  as well.  Will follow the [name] [name] while she is in the hospital.      Bevelyn Buckles. Nash Shearer, M.D.  Electronically Signed     DRC/MEDQ  D:  02/26/2007  T:  02/26/2007  Job:  [id number]

## 2011-02-08 NOTE — Consult Note (Signed)
NAME:  [name] [name], [name] [name]             ACCOUNT NO.:  [id number]   MEDICAL RECORD NO.:  [id number]          PATIENT TYPE:  OUT   LOCATION:  XRAY                         FACILITY:  [facility]   PHYSICIAN:  Sanjeev K. Deveshwar, M.D.DATE OF BIRTH:  December 31, 1945   DATE OF CONSULTATION:  DATE OF DISCHARGE:                                 CONSULTATION   CHIEF COMPLAINT:  Cerebrovascular disease.   HISTORY OF PRESENT ILLNESS:  This is a pleasant 65 year old female who  was admitted to [facility] on September 30, 2008, with a left CVA  associated with right-sided weakness.  The patient had a cerebral  angiogram, performed by Dr. Corliss Skains on October 02, 2008, that showed a  90-95% stenosis of the right middle cerebral artery.  She also had a 65%  stenosis of the left internal carotid artery at the cavernous segment.  There was a 50% right vertebral artery stenosis as well.   The patient was evaluated for the Sampras trial; however, she was not  felt to be a candidate due to the degree of stenosis.  She was evaluated  by Dr. Cari Caraway who did not feel that the patient was a surgical  candidate as she had no significant extracranial stenosis.  The patient  was also seen in consultation by Dr. Corliss Skains on October 09, 2008.  Stent-assisted angioplasty was discussed at that time including the  risks and benefits of the procedure, other treatment options, such as,  continued medical therapy was also discussed.  The patient decided to  proceed with endovascular treatment and was admitted to [facility] on November 05, 2008.  On that day, she underwent angioplasty  of the left internal carotid artery stenosis, performed under general  anesthesia by Dr. Corliss Skains.  There were no immediate or known  complications associated with the procedure.  A stent was not placed.   During the patient's hospital stay, she had some bradycardia and her  Toprol dose was decreased.  She also had some high  blood pressure, and  she was placed on Lotensin, which she had taken in the past.  The  patient was to follow up with her primary care physicians for blood  pressure and heart rate monitoring, as well as for a basic metabolic  panel to monitor her renal status on the Lotensin.  Apparently, she has  not yet followed up with them.  She presents today to be seen in  followup by Dr. Corliss Skains.   PAST MEDICAL HISTORY:  Significant for the above-noted CVA.  She has  COPD with a previous history of tobacco use.  She quit at the time of  her stroke.  She has hyperlipidemia.  She has anxiety and depression.  She had a 2-D echo that revealed an ejection fraction of 60-65%.  She  has a history of hyperkalemia.  She has mild aortic stenosis.  She was  cleared for stenting and general anesthesia by Dr. Valera Castle.   SURGICAL HISTORY:  Significant for an appendectomy, hip surgery,  cholecystectomy, and a C-section.  She denies any previous problems with  anesthesia.  ALLERGIES:  No known drug allergies.   CURRENT MEDICATIONS:  1. Aspirin 81 mg daily.  2. Plavix 75 mg daily.  3. Ventolin p.r.n.  4. Toprol-XL 25 mg one-half tablet daily.  5. Zocor daily.  6. Lotensin 10 mg daily.   SOCIAL HISTORY:  The patient lives with her son in [place].  She  previously smoked a pack of cigarettes per day since age 61.  She quit  at the time of her stroke.  She does not use alcohol.  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   FAMILY HISTORY:  Her mother is alive at age 70.  She has heart disease,  COPD, and dementia.  Her father died at age 19 from complications of  COPD.  She had a brother who died in a motor vehicle accident.  She had  a sister who died following coronary artery bypass graft surgery.   IMPRESSION AND PLAN:  As noted, the patient presents today following  angioplasty of the left internal carotid artery stenosis, which led to a  stroke in January 2010.  The  angioplasty was performed on November 05, 2008.  She also has a residual 90-95% right middle cerebral artery  stenosis.   The patient reports that she has been feeling well since her admission.  She feels that she is thinking more clearly.  She has more energy, and  her memory seems to be improved.  As noted, she has not followed up with  her primary care physicians, although she has checked her blood pressure  and pulse at home and states that it is within a normal range.  She does  have occasional headaches; however, they are not severe.  We recommended  that she try Tylenol for relief of her headache pain.   Dr. Corliss Skains reviewed the before and after images of her angioplasty.  He also pointed out the stenosis of the right middle cerebral artery.  He felt that this might require treatment at some point in the future;  however, she has been asymptomatic from this lesion.  The patient was  told to contact us immediately if she had any stroke-like symptoms or  call 911 and come to the emergency department.   Dr. Corliss Skains recommended a repeat cerebral angiogram in 3-4 months.  In  the meantime, she is to continue on her aspirin and Plavix, as well as  her other medications.  It has been recommended that she follow up with  her primary care physicians, as well as Dr. Pearlean Brownie.   Greater than 10 minutes was spent on this visit.      Delton See, P.A.    ______________________________  Grand[place] Silos. Corliss Skains, M.D.    DR/MEDQ  D:  11/25/2008  T:  11/26/2008  Job:  [id number]   cc:   Pramod P. Pearlean Brownie, MD  Jesse Sans Wall, MD, [facility]  [facility]

## 2011-02-08 NOTE — Discharge Summary (Signed)
NAME:  [name] [name], [name] [name]             ACCOUNT NO.:  [id number]   MEDICAL RECORD NO.:  [id number]          PATIENT TYPE:  INP   LOCATION:  3736                         FACILITY:  [facility]   PHYSICIAN:  [name] Overlie, MD       DATE OF BIRTH:  [birth year]   DATE OF ADMISSION:  09/30/2008  DATE OF DISCHARGE:  10/03/2008                               DISCHARGE SUMMARY   DISCHARGE DIAGNOSIS:  1. Cerebrovascular infarct with recurrent stroke involving the left      frontal area.  2. Medical noncompliance.  3. Hypertension.  4. Dyslipidemia.  5. Significant cerebrovascular disease.   PROCEDURES DONE:  MRI, MRA of the brain that showed 50-70% stenosis of  the right middle cerebral artery of the M1 segment and significantly  decreased caliber of the cavernous portion of the left internal carotid  artery.   CT scan of the head without contrast that showed hypotension in the  subcortical white matter, extending into the cortex of the posterior  left frontal lobe.  Findings are worrisome for acute subacute infarct.   CONSULTATIONS:  Dr. Delia Heady, [facility].  Dr. Durwin Nora.   SUBJECTIVE:  This is a 65 year old female with a history of  hypertension, cerebrovascular disease, coronary artery disease and  ongoing tobacco abuse.  The patient had a stroke in June 2008 and was  placed on Aggrenox.  However, the patient had to stop Aggrenox because  of being unable to afford her medications, but continues to smoke a pack  of cigarettes a day.  Two days prior to admission she noticed numbness  of the right hand and a mild gait instability, associated with headache  and dizziness and blurry vision.  However, the patient had an NIH stroke  scale score of 2.  She was found to have a CT scan that showed a  subacute CVA.  EKG at the time of admission showed normal sinus rhythm.   She was placed in a telemetry bed.  Serial troponins were negative and  she was ruled out for acute coronary syndrome.   She was evaluated by  [facility], Dr. Lesia Sago, who  recommended an MRI scan of  the brain and MRA, carotid duplex ultrasound, and 2-D echo.  The  patient's cardiac echo showed an ejection fraction of 60-65%, mild  aortic valve stenosis, no obvious vegetation.  She has had previous  transesophageal echocardiograms without any evidence of any intracardiac  shunting.  Therefore, the study was not repeated.   The patient had a cerebral angiogram done, based on neurology  recommendations.  Risks and benefits of the stent procedure were  discussed with the patient.  The patient was not considered to be a  candidate for the Sampras trial as her stenosis was 67% and not greater  than 70%.  The patient was treated aggressively for risk factor  modification.  Her cholesterol panel showed a total cholesterol of 234,  LDL of 174 and HDL of 41.  Her Zocor was increased to 80 mg p.o. daily.  The patient was also assessed by PT, OT and was found to be  stable for  discharge with home health and home PT, which is being arranged at this  time.  The patient was not found to have any significant extracranial  carotid disease and follow-up with interventional radiology for possible  stenting was recommended, based on the patient's decision.  The patient  still has not made up her mind and therefore she will discuss this  further with Dr. Pearlean Brownie.  I called Dr. Marlis Edelson office to set her up an  appointment and I was notified that the patient needs to call.  Phone  number [phone], extension [phone], to set up an appointment for herself.  The patient should also follow up with the [facility]  in 5-7 days.   DISCHARGE MEDICATIONS:  1. Aggrenox 1 tablet p.o. q. 12.  2. Nicotine patch 21 mg per day x6 weeks and 40 mg per day x2 weeks      and 10 mg a day x2 weeks.  3. Albuterol MDI 2 puffs q.4 h p.r.n.  4. Prilosec 20 mg p.o. daily.  5. Z-Pak for 5 days.  6. Zocor 80 mg p.o. daily.   7. Benazepril 20 mg p.o. daily.  8. Aspirin 81 mg p.o. daily.      [name] Overlie, MD  Electronically Signed     NA/MEDQ  D:  10/03/2008  T:  10/03/2008  Job:  [id number]

## 2011-02-08 NOTE — Consult Note (Signed)
NAME:  [name] [name], [name] [name]             ACCOUNT NO.:  [id number]   MEDICAL RECORD NO.:  [id number]          PATIENT TYPE:  INP   LOCATION:  3736                         FACILITY:  [facility]   PHYSICIAN:  Di Kindle. Edilia Bo, M.D.DATE OF BIRTH:  [birth year]   DATE OF CONSULTATION:  10/03/2008  DATE OF DISCHARGE:                                 CONSULTATION   REASON FOR CONSULTATION:  Left brain stroke with bilateral intracranial  disease.   HISTORY:  This is a pleasant 65 year old right-handed woman who was  admitted on September 30, 2008, with right-sided weakness.  Approximately 4  days prior to admission, she had noted clumsiness in her right upper  extremity.  These symptoms persisted.  She failed to mention that to her  family for several days and then once the family became aware, she was  brought to the emergency department.  Her symptoms have gradually  improved.  She denies any significant weakness or paresthesias in the  right lower extremity.  She had paresthesias and clumsiness in the right  upper extremity over several days.  She states that she has had 2  previous strokes, one in 2007 and one in 2008 and both of these were  associated with left-sided weakness according to the patient.  She also  had some history of amaurosis fugax in the left eye in the remote past  but none recently.  She did not have any slurred speech associated with  the current episode, she had had slurred speech in the past.   PAST MEDICAL HISTORY:  1. Hypertension.  2. Dyslipidemia.  3. Coronary artery disease.  4. Depression.  5. Anxiety.   She denies any history of diabetes, history of congestive heart failure.  She does according to her daughter have COPD.   ALLERGIES:  No known drug allergies.   MEDICATIONS ON ADMISSION:  Benazepril, simvastatin, aspirin, Aggrenox,  which she had not been taking but is currently receiving.   SOCIAL HISTORY:  She is divorced.  She has 3 children.  She lives  in  [place] with her daughter currently.  She tells me that she had  smoked a pack per day of cigarettes since she was 65 years old.  She  states that she has quit this admission.   REVIEW OF SYSTEMS:  GENERAL:  She has had no recent weight loss, weight  gain, or fever.  CARDIAC:  She has had no chest pain, chest pressure,  palpitations, or arrhythmias.  She does note that when she takes her  blood pressure, she sometimes is tachycardic.  PULMONARY:  She does have  a history of bronchitis.  She has had no asthma or wheezing recently.  GI:  She has had no recent change in her bowel habits and has no history  of peptic ulcer disease.  GU:  She has had no dysuria or frequency.  NEUROLOGIC:  She has an occasional headache.  She has had no seizures in  the past.  She has had the previous strokes as described above.  VASCULAR:  She does describe some pain in her legs associated with  ambulation.  She has had no significant rest pain, nonhealing ulcers, or  history of DVT.  HEMATOLOGIC:  She has had no bleeding problems or  clotting disorders.  ENT:  She has had no recent change in her eyesight  or hearing.   PHYSICAL EXAMINATION:  GENERAL:  This is a pleasant 65 year old woman  who appears her stated age.  VITAL SIGNS:  Her temperature is 98.1, heart rate is 82, and blood  pressure 101/69.  HEENT:  Unremarkable.  NECK:  Supple.  There is no cervical lymphadenopathy.  I do not detect  any carotid bruits.  LUNGS:  Clear bilaterally to auscultation.  CARDIAC:  She has a regular rate and rhythm.  She has a soft systolic  murmur.  ABDOMEN:  Soft and nontender.  I cannot palpate any aneurysm.  She has  normal pitched bowel sounds.  She has palpable femoral, popliteal, and  dorsalis pedis pulses bilaterally.  She has no significant lower  extremity swelling.  NEUROLOGIC:  Good strength in her upper extremities and lower  extremities bilaterally except for some very minimal grip weakness on   the right side.  Sensory function is intact.   LABORATORY DATA:  Her carotid duplex scan showed a 40-60% right internal  carotid artery stenosis by velocity criteria and there is a less than  39% left internal carotid artery stenosis.  Her 2-D echo showed mild  aortic stenosis.  Initial CT of the head showed hypoattenuation in the  subcortical white matter extending to the cortex of the posterior left  frontal lobe.  Subsequent MR of the brain showed a moderate-sized left  parietal cortical-subcortical acute ischemia.  She also had a cerebral  arteriogram and this showed a 90-95% right middle cerebral artery  stenosis in the M1 segment.  There was a 70-75% left internal carotid  artery stenosis in the cavernous segment.  There was no significant  carotid bifurcation disease on either side seen on her cerebral  arteriogram.   IMPRESSION:  This patient presents with a left parietal stroke which  could potentially be related to her intracranial disease.  She has no  significant extracranial disease and for this reason there is really  nothing to add further from a vascular standpoint.  She could probably  benefit from yearly duplex scan of the carotids given her risk factors.  With respect to her intracranial disease, Dr. Pearlean Brownie has reviewed her  study and discussed this with the patient.  It was felt that the  stenosis was not tight enough to meet the criteria to proceed with  stenting at this time, but consideration could be given to this in the  future.  She is on Aggrenox currently.  We have had a long discussion  about the points on tobacco cessation.  I will be happy to see her back  at any time, but from my standpoint she could be discharged.      Di Kindle. Edilia Bo, M.D.  Electronically Signed     CSD/MEDQ  D:  10/03/2008  T:  10/04/2008  Job:  [id number]   cc:   Pramod P. Pearlean Brownie, MD  [facility]

## 2011-02-08 NOTE — H&P (Signed)
NAME:  [name] [name], [name] [name]             ACCOUNT NO.:  [id number]   MEDICAL RECORD NO.:  [id number]          PATIENT TYPE:  INP   LOCATION:  1828                         FACILITY:  [facility]   PHYSICIAN:  Lonia Blood, M.D.      DATE OF BIRTH:  27-Dec-1945   DATE OF ADMISSION:  09/30/2008  DATE OF DISCHARGE:                              HISTORY & PHYSICAL   PRIMARY CARE PHYSICIAN:  Previously Production assistant, radio, but the  patient is currently unassigned.  She was scheduled with Dr. Jacky Kindle.   PRESENTING COMPLAINT:  Clumsiness and right-sided weakness.   HISTORY OF PRESENT ILLNESS:  The patient is a 65 year old white female  with prior history of CVA in 2008 who apparently has been doing alright  since discharge.  The patient was discharged then on Aggrenox among  other things.  Per patient it was expensive and she has not been taking  it consistently.  She has therefore not been protected in the last year  or so.  She stopped taking Aggrenox because of the headaches.  She came  in now having disorientation initially, but then weakness and clumsiness  of the right side for the past 4 days.  She denied any falls.  Denied  any sensory losses.  She is, however, noted that her gait is weak.  Her  previous CVA was on the left side and now she is getting weak also on  the right side.  Denied any choking sensation.  No problem with  swallowing.  No nausea, vomiting, diarrhea, or constipation.   PAST MEDICAL HISTORY:  Significant for:  1. Prior CVA involving the left frontal lobe.  2. Hypertension.  3. Dyslipidemia.  4. Coronary artery disease.  5. Depression.  6. Anxiety.  7. Tobacco use.   ALLERGIES:  She has no known drug allergies.   MEDICATIONS:  1. Benazepril 20 mg daily.  2. Simvastatin 10 mg daily.  3. Aspirin 81 mg daily.  4. Aggrenox that she is not taking.   SOCIAL HISTORY:  The patient lives in [place] with her daughter.  She  smokes about one to two packs per day.   Denied any alcohol or IV drug  use.   FAMILY HISTORY:  Significant for coronary artery disease.   REVIEW OF SYSTEMS:  A 14-point review of systems is negative except for  HPI.   PHYSICAL EXAMINATION:  VITAL SIGNS:  Temperature 98, blood pressure  140/80, pulse 100, respiratory rate 16, sats 96% on room air.  GENERAL:  She is awake, alert, oriented, in no acute distress,  communicating well.  No slurred speech.  HEENT:  PERRL.  EOMI.  NECK:  Supple.  No JVD.  No lymphadenopathy.  RESPIRATORY:  She has good air entry bilaterally.  No wheezes or rales.  CARDIOVASCULAR:  The patient has S1 and S2.  No murmurs.  ABDOMEN:  Soft and nontender with positive bowel sounds.  EXTREMITIES:  No edema, cyanosis, or clubbing.  NEUROLOGIC:  Power is 5/5 upper and lower extremities respectively.  She  has mild coordination problem and some pronator drift on the right.  Reflexes are  2+ upper and lower extremities respectively.   LABORATORY DATA:  Sodium 138, potassium 6.1, chloride 103, CO2 of 28,  glucose 67, BUN 22, creatinine 0.88.  Her white count is 13.8,  hemoglobin 15.1, and platelet count 153.  Repeat potassium was 4.0  indicating that the first sample was hemolyzed.  Head CT without  contrast showed new acute to subacute CVA involving the subcortical  white matter extending into the cortex of the posterior left frontal  lobe.  Findings were consistent with new CVA.   ASSESSMENT:  Therefore, this is a 65 year old female with prior history  of cerebrovascular accident who was been on Aggrenox, but has not been  taking her medications consistently presenting with a new left frontal  lobe CVA.   PLAN:  Therefore will be:  1. Acute CVA.  We will admit the patient.  Restart her on Aggrenox      once she passes swallow eval.  Give an aspirin at this point.  Do      more workup including MRI and MRA.  Check 2D echo.  Carotid      Dopplers as well as B12 folate and homocysteine levels.  The       patient has been counseled extensively regarding medication use.      We will make sure she has been plugged not into the followup system      after discharge.  2. Hypertension.  Again, the patient's medication will be streamlined.      Her blood pressure is acceptable at this point.  We will continue      to monitor it closely.  3. Dyslipidemia.  I will check fasting lipid panel and the patient is      continued on simvastatin once she is able to take orally.  4. Coronary artery disease.  The patient has no chest pain.  We will      continue to cycle her enzymes and continue current medications.  5. Tobacco use disorder.  I will put the patient on nicotine patch and      I have counseled her extensively.  6. Hypoglycemia.  This is transient, but the patient is not a diabetic      and no history of taking insulin.  Otherwise, we will get Neurology      consult and further treatment will depend on how the patient does      in the hospital.      Lonia Blood, M.D.  Electronically Signed     LG/MEDQ  D:  09/30/2008  T:  10/01/2008  Job:  [id number]

## 2011-02-11 NOTE — Op Note (Signed)
NAME:  [name] [name], [name] [name]             ACCOUNT NO.:  [id number]   MEDICAL RECORD NO.:  [id number]          PATIENT TYPE:  AMB   LOCATION:  DAY                          [facility]ACILITY[facility]   PHYSICIAN:  Rodney A. Mortenson, M.D.DATE OF BIRTH:  1945/11/25   DATE OF PROCEDURE:  08/23/2005  DATE OF DISCHARGE:                                 OPERATIVE REPORT   PREOPERATIVE DIAGNOSIS:  Presumptive diagnosis, stress fracture, left  femoral neck.   POSTOPERATIVE DIAGNOSIS:  Presumptive diagnosis, stress fracture, left  femoral neck.   OPERATION:  Three cannulated 6.5 mm screws inserted into the left hip under  fluoroscopic control, good.   SURGEON:  Lenard Galloway. Chaney Malling, M.D.   Threasa HeadsChestine Spore.   ANESTHESIA:  Spinal.   PROCEDURE:  After satisfactory spinal anesthesia, the patient was placed on  the fracture table in the supine position.  The left hip and thigh was then  prepped with DuraPrep and draped in the usual manner.  A Berry drape was  then applied to the thigh and placed over with curtain holders.  The C-arm  was then put in position.  A guidepin is then placed over the anterior  aspect of the thigh showing appropriate angle and site of incision about the  lateral portion of the thigh.  A 2 inch incision was made laterally.  A  guidepin is then passed over the femoral neck and positioned for AP at the  appropriate angle.  A second guidepin is then placed over the lateral cortex  of the femur and drilled up through the femoral neck into the head under  power.  The C-arm is used throughout.  The first pin was put in excellent  position in both planes.  A second pin was inserted in a similar manner  using the C-arm for guidance, and a third pin was placed in a similar manner  using C-arm for guidance.  Each pin was then measured for the appropriate  length and appropriately 6.5 mm cannulated screws were passed over the  guidepins.  The cannulated screws were passed out the end of the  femoral  head.  The position of the pins was confirmed with C-arm in two planes.  The  guidepins were then removed.  A stainless steel staple was then used to  close the skin, and sterile dressings were applied.  The patient was then  returned to the recovery room in excellent condition.  The technical aspect  of this procedure went extremely well.   DRAINS:  None.   COMPLICATIONS:  None.           ______________________________  Lenard Galloway. Chaney Malling, M.D.     RAM/MEDQ  D:  08/23/2005  T:  08/23/2005  Job:  [id number]

## 2011-02-11 NOTE — H&P (Signed)
[facility]  Patient:    [name] [name], [name] [name]                      MRN: [id number] Adm. Date:  [id number] Attending:  Trinna Post                         History and Physical  No dictation. DD:  03/09/01 TD:  03/09/01 Job: [id number]

## 2011-02-11 NOTE — H&P (Signed)
[place]  Patient:    [name] [name], [name] [name]                      MRN: [id number] Adm. Date:  [id number] Attending:  Lorre Nick                         History and Physical  CHIEF COMPLAINT:  "Chest pain and shortness of breath."  HISTORY OF PRESENT ILLNESS:  This is a 65 year old white female with a history of hypertension, now presenting to the [facility] Emergency Department with onset of intermittent chest pain starting around 5 p.m. today while driving.  She relates substernal chest pressure radiating to the left shoulder and occasionally left neck with associated symptoms of diaphoresis, dyspnea and occasional nausea.  Her episodes have lasted generally about 5 to 15 minutes. She has not had any recent exertional chest pains.  She initially presented here with chest pain that was 8/10 and this promptly went to 0/10 with two sublingual nitroglycerin.  Her EKG here shows no acute changes.  She has had no prior history of cardiac workup or cardiac problems.  She has multiple risk factors for coronary disease including smoking history, positive family history of premature coronary artery disease, hypertension and possible hyperlipidemia.  PAST MEDICAL HISTORY:  Chronic problems with hypertension.  MEDICATIONS: 1. Lotensin 10 mg a day. 2. Aspirin one a day.  SURGERIES:  Appendectomy and cholecystectomy.  ALLERGIES:  No known drug allergies.  FAMILY HISTORY:  Father died at age 46 -- alcoholic cirrhosis.  Mother is alive at age 64 and no significant medical problems.  She had a sister who died at age 37 of MI.  Uncle died at age 30 of MI.  SOCIAL HISTORY:  She has been divorced x 3.  She had three children; one died at age 75 possibly of hydrocephalus complications.  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  Smokes one-half pack of cigarettes per day.  No alcohol use.  REVIEW OF SYSTEMS:  As per HPI.  She denies any recent headaches,  abdominal pain, dysuria, stool changes or weight changes.  PHYSICAL EXAMINATION:  VITAL SIGNS:  She is afebrile.  Blood pressure 118/70.  Pulse 70s to 80s and regular.  Respirations 20.  O2 saturations 98% on room air.  HEENT:  Pupils are equal, round and reactive to light.  Oropharynx:  No lesions.  NECK:  No adenopathy, JVD or bruits.  CHEST:  Clear to auscultation throughout.  Chest wall is nontender.  HEART:  Regular rhythm and rate with no murmur.  ABDOMEN:  Normal bowel sounds.  Soft and nontender, without hepatosplenomegaly.  BREASTS:  Deferred at this time.  PELVIC:  Deferred at this time.  EXTREMITIES:  Trace edema in both feet and lower legs with 2+ dorsalis pedis pulses bilaterally.  LABORATORY DATA:  Chest x-ray shows no apparent disease.  EKG:  Normal sinus rhythm with no acute ST-T segment changes.  IMPRESSION:  This is a 65 year old white female with no prior history of coronary artery disease, now presenting with chest pain worrisome for unstable angina.  She has no acute electrocardiographic changes but her symptoms are compatible with angina and she has multiple risk factors.  She has also had total relief of her chest pain with nitroglycerin and oxygen.  PLAN:  Will admit and rule out for MI.  Will continue with O2, aspirin and start nitroglycerin drip at three drops  per minute.  Will also start low-dose beta blocker as tolerated by her heart rate and blood pressure.  Cardiology is to see in the morning and I have already spoken with Dr. Clovis Pu. Brackbill by phone and they will see her sooner as needed if her condition should worsen.  She will be admitted to a monitored bed. DD:  03/08/01 TD:  03/09/01 Job: [id number]

## 2011-02-11 NOTE — Consult Note (Signed)
[place]  Patient:    [name] [name], [name] [name]                      MRN: [id number] Adm. Date:  [id number] Attending:  Trinna Post Dictator:   [id number] CC:         Kristian Covey, M.D.  Gloriajean Dell. Andrey Campanile, M.D.   Consultation Report  CHIEF COMPLAINT:  Chest pain.  HISTORY OF PRESENT ILLNESS:  This is a 65 year old woman admitted with recurrent left chest and left arm pain which occurred while driving home from work today.  Patient came to the emergency room where her chest pain was relieved with two nitroglycerin.  She has no history of diaphoresis.  The patient was dyspneic, however.  She does not give any history to suggest typical exertional angina.  She does have multiple risk factors for coronary disease including smoking a pack a day or more and a strong family history of coronary artery disease.  Her sister died at age 24 after having bypass surgery.  The patient herself has hypertension.  She does not know her cholesterol status.  PRESENT MEDICATIONS: 1. Lotensin 10 mg daily. 2. Aspirin once a day.  SOCIAL HISTORY:  She does not drink alcohol but does smoke cigarettes as noted.  She works for an [employer].  REVIEW OF SYSTEMS:  Unremarkable except for the present illness.  She denies any cough or sputum production.  She has had no change in bowel habits. Remainder of review of systems unremarkable.  PAST MEDICAL HISTORY:  History of cholecystectomy and appendectomy.  ALLERGIES:  No known drug allergies.  PHYSICAL EXAMINATION:  VITAL SIGNS:  Blood pressure is 132/76, pulse 80 and regular, respirations normal.  HEAD/NECK:  Exam reveals that the pupils are equal.  The sclerae are clear. Carotids with no bruits.  Jugular venous pressure is normal.  CHEST:  Clear.  The patient does have some tenderness along the left costochondral margin but states that that pain is different from the pain that brought her to the  hospital.  HEART:  No murmur, gallop, or pericardial rub.  ABDOMEN:  Soft and nontender.  EXTREMITIES:  Good peripheral pulses.  No phlebitis or edema.  LABORATORY DATA:  Electrocardiogram shows no acute change.  Chest x-ray is normal.  IMPRESSION: 1. Possible unstable angina, rule out myocardial infarction. 2. Cigarette abuse. 3. Hypertensive cardiovascular disease. 4. Unknown cholesterol status.  DISPOSITION:  Agree with Dr. Mar Daring admission orders.  Will get serial enzymes.  She will be treated with intravenous nitroglycerin, aspirin, and beta blocker.  I will check her lipids on the morning.  Will hold her breakfast Friday morning and try to get her added on schedule for cardiac catheterization. DD:  03/10/99 TD:  03/09/01 Job: [id number]

## 2011-02-11 NOTE — H&P (Signed)
NAME:  [name] [name], [name] [name]             ACCOUNT NO.:  [id number]   MEDICAL RECORD NO.:  [id number]          PATIENT TYPE:  INP   LOCATION:  3008                         FACILITY:  [facility]   PHYSICIAN:  Adrian Blackwater, MDDATE OF BIRTH:  1946/09/23   DATE OF ADMISSION:  02/18/2006  DATE OF DISCHARGE:  02/21/2006                                HISTORY & PHYSICAL   CHIEF COMPLAINT:  Face numbness and lip deviation.   HISTORY OF PRESENT ILLNESS:  A 65 year old white female patient brought by  family from [place], [place], with complaint of left face  numbness, lip deviation to the right.  Episode started around 7:30 p.m.  while patient was sitting prone at the computer.  Had headache for several  minutes, then experienced numbness of the left lower face, unable to  pronounce any sounds, speech recovered slowly back to normal when evaluated  at 10:30 p.m.  Similar episode two weeks ago that resolved in one hour.  Patient did not tell family about it.   MEDICATIONS:  1.  Lopressor 25 mg quarter pill daily.  2.  Lipitor 40 mg daily.  3.  Advil three tablets b.i.d.  4.  Multivitamins with B12.  5.  Xanax 0.5 mg p.o. nightly.  6.  Lotensin 10 mg but was discontinued in the past.   ALLERGIES:  NO KNOWN DRUG ALLERGIES.   REVIEW OF SYSTEMS:  CARDIORESPIRATORY:  Dyspnea on exertion, severe.  No  palpitations, no chest pain.  History of frequent bronchitis.  No COPD  diagnosis.  Pneumonia November 2006.  GENITOURINARY:  Menopause.  GASTROINTESTINAL:  Bowel movements every other day.  Good voiding, no  dysuria.  NEUROLOGIC:  __________, paresthesia relieved by ibuprofen and  rest.  Frequent dizziness.  MUSCULOSKELETAL:  Osteoarthritis.   PAST MEDICAL HISTORY:  1.  Hypertension.  2.  Coronary artery disease, status post catheterization by Evelena Peat,      M.D., on March 09, 2001, with left ventricular ejection fraction of 55 to      60% and diffuse mild coronary arterial  sclerosis.  3.  Hyperlipidemia.  4.  Osteoarthritis at the level of L4-L5 questionable.  5.  Depression.  6.  Patient had ER visit September 2005 with left leg weakness.  MRI showed      atrophy and small vessel disease.   PAST SURGICAL HISTORY:  1.  Left hip pinning (femoral neck fracture August 15, 2005, by Dr.      Chaney Malling).  2.  Cholecystectomy and appendectomy in 1976.   SOCIAL HISTORY:  Living in [place], [place], with daughter, son-in-  law and grandkids.  Smokes x21 years of one and a half pack per day, quit  once after cardiac catheterization, __________No alcohol, no drugs.   FAMILY HISTORY:  Father alcoholic, died at 59 years old with diabetes  mellitus.  Sister 32 years old, had MI and CABG.  Mother 73 years old, has  MI in her 66s.   LABORATORY DATA:  Calcium 9.1, sodium 145, potassium 4, chloride 111, CO2  31, BUN 19, creatinine 0.9, glucose 102, AST 24, ALT 22, alkaline  phosphatase 95, bilirubin 0.9, total protein 6.5, albumin 3.7.  White blood  cells 9.4, hemoglobin 13.8, hematocrit  40.4, platelets 152.  PT 12.5, INR  0.9, PTT 32.   CT scan of the head shows atrophy with chronic small vessel ischemic  changes, no acute intracranial findings.  Chest x-ray pending.  MRI/MRA of  the brain pending.   PHYSICAL EXAMINATION:  GENERAL APPEARANCE:  No acute distress,  VITAL SIGNS:  Temperature 97.8, heart rate 90, respiratory rate 16, blood  pressure 118/61, O2 95% on room air.  HEENT:  Normocephalic and atraumatic.  Extraocular movements intact.  Pupils  are equal, round and reactive to light.  Fundus  normal.  Oropharynx normal.  Upper denture.  NECK:  Supple.  No adenopathy, no JVD, carotid bruits right more than left.  LUNGS:  Pleurally decreased breath sounds.  No wheezing, rhonchi or rales.  CARDIOVASCULAR:  S1 and S2 normal.  A 1 to 2/6 systolic ejection murmur  right upper sternal border.  Peripheral pulses decreased bilaterally.  ABDOMEN:   Positive bowel sounds, nontender, nondistended, negative  hepatosplenomegaly, right femoral bruit.  EXTREMITIES:  No edema, no cyanosis.  Left hip limited range of motion  secondary to surgery.  NEUROLOGIC:  No signs of paralysis.  Appearance of nasal labial fold with  mild down to the right, mild right deviation of protruding tongue, forehead  muscle fair, no deficits.  Muscle tone 5/5 all extremities.  Deep tendon  reflexes 2+ except left femoral, left patellar. Patient alert and oriented  x3.  Normal gait.  No tremors. No plantar drift.  Romberg negative.   ASSESSMENT:  Patient 65 year old white female with transient ischemic attack  episode, recurrent, hypertension, hyperlipidemia, coronary artery disease.  1.  Transient ischemic attack:  Patient with increased risk, age, coronary      artery disease, previous transient ischemic attack, smoker, possible      peripheral artery disease, recurrent cerebrovascular accident ischemic.      We ordered MRI/MRA of the brain for __________ evaluation, Dopplers and      2-D echo as work-up.  Continue aspirin as secondary prevention.  2.  Differential diagnosis would be Bell's Palsy.  Peripheral sclerosis      onset with resolution, also seizure, __________  infection, syncope,      migraines, intracoronary hemorrhage.  Will also order PT and OT.  3.  Headache, tension versus migraine.  Trial of __________.  Recommend he      has outpatient.  Patient asymptomatic now.  4.  Coronary artery disease, mild.  Repeat echo.  5.  __________ .  No claudication but decreased peripheral pulses with      possible femoral bruits.  Defer evaluation as outpatient.  6.  Shortness of breath.  Report of possible chronic obstructive pulmonary      disease on diagnosis.  __________  Patient has performed no therapy      __________  p.r.n. Advair 25/50.  7.  Hypertension.  Well controlled.  8.  Hyperlipidemia.  Will check lipid profile in the morning.  Continue       Lipitor. 9.  Deep venous thrombosis prophylaxis.  STDs bilaterally.  10. Nicotine abuse.  Not ready to quit, will address tomorrow.  11. Physical therapy and occupational therapy for rehab.      Adrian Blackwater, MD     IM/MEDQ  D:  02/23/2006  T:  02/23/2006  Job:  [id number]

## 2011-02-11 NOTE — Consult Note (Signed)
NAME:  [name] [name], [name] [name]             ACCOUNT NO.:  [id number]   MEDICAL RECORD NO.:  [id number]          PATIENT TYPE:  INP   LOCATION:  3008                         FACILITY:  [facility]   PHYSICIAN:  Melvyn Novas, M.D.  DATE OF BIRTH:  1946-02-15   DATE OF CONSULTATION:  02/20/2006  DATE OF DISCHARGE:                                   CONSULTATION   We are consulted today on Feb 20, 2006 to evaluate this patient for the  family practice teaching service under Dr. Pearlean Brownie.  The patient is presenting with a left-sided facial droop corresponding with  right-sided patchy ischemic MRI lesions that are consistent with an acute  stroke event.   The patient presented on Saturday night to the ER at [facility]  with a feeling of left-sided facialpuffiness and ness but the ER physician  did not document a facial droop in his note.    There was no extremity abnormalities seen and aspirin was given.  The  patient had an NIH stroke scale deficit less than 3 and was therefore not  involved in any clot buster therapy or studies.   PAST MEDICAL HISTORY:  The patient has hypertension, cholesterol elevation,  possible COPD, coronary artery disease, peripheral vascular disease, TIA in  the past at least one time.  She is postmenopausal and she is status post pin repair of a left-sided hip  fracture.   MEDICATIONS:  Lipitor, aspirin nicotine patch, oxycodone.   SOCIAL HISTORY:  She is a one and a half pack per day smoker.    Lives in [place] with her daughter and her son-in-law as well as  watching the grandkids.  She denies any alcohol or drug use.   FAMILY HISTORY:  Positive for father who had complications of alcoholism.  A  sister has coronary artery disease, MI, and CABG.   The patient's CT of the head showed atrophy and chronic small vessel  disease.  The patient's MRI of the head shows right frontal multifocal  patchy lesions that are acute in nature and could  demonstrate embolic  disease.   The patient states that in her review of systems that she still feels  clumsy.    Her left face appears to her puffy and she has headaches at the very  nape of the neck radiating outwards.  Her vital signs are stable.  She is afebrile.  Heart rate is 62, temperature 97, blood pressure 120/65  bilaterally.  The patient is right-handed and can use knife and fork here in an unimpaired  fashion.  She stated, that her handwriting had not changed and she .[name]  ROS:  gave me a trial signature. Denies any vertigo, tremor, febrile preceding  illness, and had no diarrhea, pain or mental status changes   Mental status:  Alert and oriented x3.  There is mild dysarthria with a left  facial droop.  Cranial nerve examination further shows tingling/numbness  over the left face.  No forehead involvement , no myersons sign.  Neck is  supple.  The patient has occipital headaches.  She has equal pupils, intact  visual fields, and extraocular movements.  Tongue movement is intact.  Motor  examination shows increased tone in the right arm with a tremor when  extended.  Deep tendon reflexes were equally brisk bilaterally.  No pronator  drift on finger-nose test.  Finger-nose test itself was intact.  Lower  extremities show equal deep tendon reflexes and downgoing toes.  Sensory:  Except for facial numbness, no deficit on the body.   ASSESSMENT:  Embolic strokes, ?MRI supports basoembolic versus cardioembolic  in nature.  Patchy multifocal lesion definitely speaks more for an embolic event.  Unfortunately, due to the holiday weekend a 2-D echocardiogram has not been  read yet and I suggest that if the 2-D echocardiogram is not clear at about  the possibility of a cardiac embolic source to consider then a  transesophageal echocardiogram.  The patient started a daily aspirin here.  I would also add to check a homocysteine, continue the cholesterol treatment  as already  initiated by the primary service and if a cardiac thrombus is  seen the patient needs to be coumadinized.      Melvyn Novas, M.D.  Electronically Signed     CD/MEDQ  D:  02/20/2006  T:  02/20/2006  Job:  [id number]   cc:   Pramod P. Pearlean Brownie, MD  Fax: [phone]

## 2011-02-11 NOTE — Cardiovascular Report (Signed)
[place]  Patient:    [name] [name], [name] [name]                      MRN: [id number] Proc. Date: 03/09/01 Adm. Date:  [id number] Attending:  Trinna Post CC:         Kristian Covey, M.D.   Cardiac Catheterization  PROCEDURE:  Left heart catheterization with selective coronary angiography, left ventricular angiography.  TYPE AND SITE OF ENTRY:  Percutaneous right femoral artery with Perclose.  CATHETERS:  A 6 French 4 curved Judkins right and left coronary catheters, 6 French pigtail ventriculographic catheter.  CONTRAST MATERIAL:  Omnipaque.  MEDICATIONS GIVEN PRIOR TO THE PROCEDURE:  Valium 10 mg p.o.  MEDICATIONS GIVEN DURING THE PROCEDURE:  Versed 2 mg IV.  COMMENTS:  The patient tolerated the procedure well.  HEMODYNAMIC DATA:  The aortic pressure was 129/88, LV was 130/15.  There was no aortic valve gradient noted on pullback.  ANGIOGRAPHIC DATA: 1. Left main coronary artery:  The left main coronary artery had 20%    distal narrowing. 2. Left circumflex:  The left circumflex had a mild 20% narrowing at the    level of the first obtuse marginal.  It was certainly not an obstructive    lesion. 3. Left anterior descending:  The left anterior descending had irregularities.    Approximately two thirds the way down the vessel there is a 20-30%    narrowing and then just prior to crossing the apex another 20-30%    narrowing. 4. Right coronary artery:  The right coronary artery is a large dominant    vessel.  There is scattered diffuse irregularities of approximately 20-30%    narrowing.  There is a 30% narrowing at the origin of the posterior    descending coronary.  There is no significant focal obstructive disease.  LEFT VENTRICULOGRAPHY:  Left ventricular angiogram was performed in the RAO position.  Overall cardiac size and silhouette are normal.  Left ventricular function is normal.  Global ejection fraction is  55-60%.  OVERALL IMPRESSION: 1. Normal left ventricular function. 2. Mild diffuse coronary atherosclerosis.  DISCUSSION:  It is felt that the patient should be stable at this point in time.  She will need to modify cardiovascular risk factors. DD:  03/09/01 TD:  03/09/01 Job: [id number]

## 2011-02-11 NOTE — Discharge Summary (Signed)
NAME:  [name] [name], [name] [name]             ACCOUNT NO.:  [id number]   MEDICAL RECORD NO.:  [id number]          PATIENT TYPE:  INP   LOCATION:  3008                         FACILITY:  [facility]   PHYSICIAN:  Pearlean Brownie, M.D.DATE OF BIRTH:  1946/03/25   DATE OF ADMISSION:  02/18/2006  DATE OF DISCHARGE:  02/21/2006                                 DISCHARGE SUMMARY   DISCHARGE DIAGNOSES:  1.  Transient ischemic attack verus subacute stroke.  2.  Hyperlipidemia.  3.  Hypertension.  4.  Coronary artery disease.  5.  Tobacco abuse.  6.  Depression.  7.  Chronic headache.  8.  Chronic deconditioning.   DISCHARGE MEDICATIONS:  1.  Aspirin 325 mg daily.  2.  Lipitor 80 mg daily.  3.  Nicotine patch 21 mg daily.   PRIMARY CARE PHYSICIAN:  [facility].   CONSULTATIONS:  Neurology, Melvyn Novas, M.D.   CARDIOLOGIST:  [facility], new patient.   HOSPITAL COURSE:  PROBLEM #1 -  TRANSIENT ISCHEMIC ATTACK:  The patient is a  65 year old Caucasian lady who presented with left-sided facial droop  corresponding with a right-sided patchy ischemic MRI lesion that is  consistent with a subacute stroke event.  The patient's CT of the head was  also significant for atrophy and chronic small vessel disease with a right  frontal multi-focal patchy lesion that is suspected to be secondary to an  embolic event.  Per neurology, MRI supported basoembolic versus  cardioembolic stroke etiology.  A 2-D echocardiogram was performed with  unclear etiology for stroke, specifically, normal LV function, LV ejection  fraction 60%, no regional wall motion abnormalities, and no notable embolic  source.  Recommendation was made for transesophageal echocardiogram.  However, first available date for this test was several days past day of  discharge.  Given no clear embolic focus identified, it was explained to the  patient that the risk of secondary side effects from anticoagulation versus  risk of re-bleed  in the absence of significant clot-binding, made the risk  of anticoagulation too great.  The patient was discharged with aspirin  alone.  The patient was scheduled for TEE two days after discharge with  followup with [facility].  The patient was also scheduled for close  followup with [facility] with the instruction that should the TEE find an  embolic source that anticoagulation be started at that time.  Homocystine  level was pending at time of discharge.  For overall risk stratification,  the patient had lipid panel performed during her hospital stay.  Cholesterol  was 212, triglycerides 97, HDL 41, and LDL 152.  The patient was started on  Lipitor 80 mg daily.   PROBLEM #2 -  HYPERLIPIDEMIA:  As above, in #1.   PROBLEM #3 -  HYPERTENSION:  The patient's anti-hypertensive medications  were held on admission.  The patient had blood pressures that ranged in the  low 100s/50s to 80s during her hospital stay.  Anti-hypertensive medications  were held at discharge.   PROBLEM #4 -  CORONARY ARTERY DISEASE:  The patient on aspirin and statin.  Echo as noted  above.   PROBLEM #5 -  TOBACCO ABUSE:  The patient was placed on continuous nicotine  patch.  The patient was instructed as to the risks and benefits of smoking  and scheduled for smoking cessation outpatient followup.   PROBLEM #6 -  DEPRESSION:  The patient admitted to being depressed during  hospital stay.  Would recommend that primary MD follow up on this complaint  as an outpatient.  The patient was not actively suicidal nor was she in  danger to herself or others.  This was not an acute event.   PROBLEM #7 -  HEADACHE:  The patient was admitted with a prior history of  chronic headaches.  The exact etiology of the headaches was unclear, but  most likely tension headaches.  The patient was headache-free during her  hospital stay and recommendation was made for outpatient followup should the  headaches recur.    PROBLEM #8 -  DECONDITIONING:  The patient was physically deconditioned on  admission.  PT and OT saw the patient during stay, and the patient was  scheduled for followup with rehab.   FOLLOWUP APPOINTMENTS:  1.  [facility] on Thursday, Feb 23, 2006, at 11 a.m.  Telephone number [phone].  2.  [facility].  Arrangements were made for them      to call the patient at discharge.  3.  [facility], TEE, for February 24, 2006, at 12:30.      Towana Badger, M.D.    ______________________________  Pearlean Brownie, M.D.    JP/MEDQ  D:  02/21/2006  T:  02/21/2006  Job:  [id number]

## 2011-06-17 LAB — POCT URINALYSIS DIP (DEVICE)
Bilirubin Urine: NEGATIVE
Glucose, UA: NEGATIVE
Ketones, ur: NEGATIVE
Nitrite: NEGATIVE
Operator id: 239701
Protein, ur: 30 — AB
Specific Gravity, Urine: 1.02
Urobilinogen, UA: 0.2
pH: 5.5

## 2011-07-14 LAB — CBC
HCT: 37
HCT: 38.3
HCT: 39.8
HCT: 44.1
Hemoglobin: 12.7
Hemoglobin: 13.1
Hemoglobin: 13.5
Hemoglobin: 14.9
MCHC: 33.8
MCHC: 33.9
MCHC: 34.2
MCHC: 34.3
MCV: 88.7
MCV: 89.1
MCV: 89.3
MCV: 90.5
Platelets: 112 — ABNORMAL LOW
Platelets: 123 — ABNORMAL LOW
Platelets: 143 — ABNORMAL LOW
Platelets: 99 — ABNORMAL LOW
RBC: 4.15
RBC: 4.32
RBC: 4.4
RBC: 4.94
RDW: 13.4
RDW: 13.5
RDW: 13.6
RDW: 13.7
WBC: 6.1
WBC: 6.9
WBC: 7.5
WBC: 9

## 2011-07-14 LAB — HEPARIN LEVEL (UNFRACTIONATED)
Heparin Unfractionated: 0.23 — ABNORMAL LOW
Heparin Unfractionated: 0.37
Heparin Unfractionated: 0.44
Heparin Unfractionated: 0.44
Heparin Unfractionated: 0.63
Heparin Unfractionated: <0.1 — ABNORMAL LOW

## 2011-07-14 LAB — I-STAT 8, (EC8 V) (CONVERTED LAB)
BUN: 18
Bicarbonate: 25.7 — ABNORMAL HIGH
Chloride: 109
Glucose, Bld: 97
HCT: 46
Hemoglobin: 15.6 — ABNORMAL HIGH
Operator id: 277751
Potassium: 4.2
Sodium: 139
TCO2: 27
pCO2, Ven: 43.2 — ABNORMAL LOW
pH, Ven: 7.383 — ABNORMAL HIGH

## 2011-07-14 LAB — LIPID PANEL
Cholesterol: 273 — ABNORMAL HIGH
HDL: 48
LDL Cholesterol: 197 — ABNORMAL HIGH
Total CHOL/HDL Ratio: 5.7
Triglycerides: 142
VLDL: 28

## 2011-07-14 LAB — BASIC METABOLIC PANEL
BUN: 14
CO2: 28
Calcium: 8.9
Chloride: 110
Creatinine, Ser: 0.75
GFR calc Af Amer: >60
GFR calc non Af Amer: >60
Glucose, Bld: 86
Potassium: 4.2
Sodium: 142

## 2011-07-14 LAB — VITAMIN B12: Vitamin B-12: 812 (ref 211–911)

## 2011-07-14 LAB — DIFFERENTIAL
Basophils Absolute: 0.1
Basophils Relative: 1
Eosinophils Absolute: 0.2
Eosinophils Relative: 3
Lymphocytes Relative: 33
Lymphs Abs: 3
Monocytes Absolute: 0.6
Monocytes Relative: 7
Neutro Abs: 5.1
Neutrophils Relative %: 57

## 2011-07-14 LAB — POCT I-STAT CREATININE
Creatinine, Ser: 0.9
Operator id: 277751

## 2011-07-14 LAB — HOMOCYSTEINE
Homocysteine: 11.6
Homocysteine: 9.6

## 2011-07-14 LAB — PROTIME-INR
INR: 1
Prothrombin Time: 12.9

## 2011-07-14 LAB — TSH: TSH: 13.145 — ABNORMAL HIGH

## 2011-07-14 LAB — HEMOGLOBIN A1C: Hgb A1c MFr Bld: 5.7

## 2011-07-14 LAB — APTT: aPTT: 31

## 2011-07-14 LAB — FOLATE: Folate: 17.2

## 2012-06-13 ENCOUNTER — Telehealth (HOSPITAL_COMMUNITY): Payer: Self-pay

## 2012-06-13 NOTE — Telephone Encounter (Signed)
I returned [name] with [facility] call but was placed on hold.  I held until my other line started ringing.  I will call back.

## 2012-06-15 ENCOUNTER — Other Ambulatory Visit (HOSPITAL_COMMUNITY): Payer: Self-pay | Admitting: Nurse Practitioner

## 2012-06-15 DIAGNOSIS — I639 Cerebral infarction, unspecified: Secondary | ICD-10-CM

## 2012-06-15 DIAGNOSIS — Z09 Encounter for follow-up examination after completed treatment for conditions other than malignant neoplasm: Secondary | ICD-10-CM

## 2012-06-21 ENCOUNTER — Other Ambulatory Visit: Payer: Self-pay | Admitting: Physician Assistant

## 2012-06-21 ENCOUNTER — Other Ambulatory Visit: Payer: Self-pay | Admitting: Radiology

## 2012-06-21 ENCOUNTER — Encounter (HOSPITAL_COMMUNITY): Payer: Self-pay | Admitting: Pharmacy Technician

## 2012-06-26 ENCOUNTER — Other Ambulatory Visit (HOSPITAL_COMMUNITY): Payer: Self-pay | Admitting: Nurse Practitioner

## 2012-06-26 ENCOUNTER — Encounter (HOSPITAL_COMMUNITY): Payer: Self-pay

## 2012-06-26 ENCOUNTER — Ambulatory Visit (HOSPITAL_COMMUNITY)
Admission: RE | Admit: 2012-06-26 | Discharge: 2012-06-26 | Disposition: A | Discharge status: Home / Self Care | Payer: MEDICARE | Source: Ambulatory Visit | Attending: Nurse Practitioner | Admitting: Nurse Practitioner

## 2012-06-26 DIAGNOSIS — I639 Cerebral infarction, unspecified: Secondary | ICD-10-CM

## 2012-06-26 DIAGNOSIS — I10 Essential (primary) hypertension: Secondary | ICD-10-CM | POA: Insufficient documentation

## 2012-06-26 DIAGNOSIS — Z09 Encounter for follow-up examination after completed treatment for conditions other than malignant neoplasm: Secondary | ICD-10-CM

## 2012-06-26 DIAGNOSIS — I658 Occlusion and stenosis of other precerebral arteries: Secondary | ICD-10-CM | POA: Insufficient documentation

## 2012-06-26 DIAGNOSIS — I6529 Occlusion and stenosis of unspecified carotid artery: Secondary | ICD-10-CM | POA: Insufficient documentation

## 2012-06-26 DIAGNOSIS — I671 Cerebral aneurysm, nonruptured: Secondary | ICD-10-CM | POA: Insufficient documentation

## 2012-06-26 DIAGNOSIS — J449 Chronic obstructive pulmonary disease, unspecified: Secondary | ICD-10-CM | POA: Insufficient documentation

## 2012-06-26 DIAGNOSIS — J4489 Other specified chronic obstructive pulmonary disease: Secondary | ICD-10-CM | POA: Insufficient documentation

## 2012-06-26 HISTORY — DX: Peripheral vascular disease, unspecified: I73.9

## 2012-06-26 HISTORY — DX: Cerebral infarction, unspecified: I63.9

## 2012-06-26 HISTORY — DX: Chronic obstructive pulmonary disease, unspecified: J44.9

## 2012-06-26 HISTORY — DX: Hyperlipidemia, unspecified: E78.5

## 2012-06-26 HISTORY — DX: Essential (primary) hypertension: I10

## 2012-06-26 LAB — BASIC METABOLIC PANEL
BUN: 18 mg/dL (ref 6–23)
CO2: 24 mEq/L (ref 19–32)
Calcium: 9.5 mg/dL (ref 8.4–10.5)
Chloride: 102 mEq/L (ref 96–112)
Creatinine, Ser: 0.77 mg/dL (ref 0.50–1.10)
GFR calc Af Amer: >90 mL/min (ref >90)
GFR calc non Af Amer: 86 mL/min — ABNORMAL LOW (ref >90)
Glucose, Bld: 118 mg/dL — ABNORMAL HIGH (ref 70–99)
Potassium: 4 mEq/L (ref 3.5–5.1)
Sodium: 136 mEq/L (ref 135–145)

## 2012-06-26 LAB — PROTIME-INR
INR: 0.95 (ref 0.00–1.49)
Prothrombin Time: 12.6 seconds (ref 11.6–15.2)

## 2012-06-26 LAB — CBC WITH DIFFERENTIAL/PLATELET
Basophils Absolute: 0.1 10*3/uL (ref 0.0–0.1)
Basophils Relative: 2 % — ABNORMAL HIGH (ref 0–1)
Eosinophils Absolute: 0.1 10*3/uL (ref 0.0–0.7)
Eosinophils Relative: 2 % (ref 0–5)
HCT: 43.4 % (ref 36.0–46.0)
Hemoglobin: 14.6 g/dL (ref 12.0–15.0)
Lymphocytes Relative: 30 % (ref 12–46)
Lymphs Abs: 2 10*3/uL (ref 0.7–4.0)
MCH: 29.9 pg (ref 26.0–34.0)
MCHC: 33.6 g/dL (ref 30.0–36.0)
MCV: 88.9 fL (ref 78.0–100.0)
Monocytes Absolute: 0.5 10*3/uL (ref 0.1–1.0)
Monocytes Relative: 8 % (ref 3–12)
Neutro Abs: 3.8 10*3/uL (ref 1.7–7.7)
Neutrophils Relative %: 58 % (ref 43–77)
Platelets: 150 10*3/uL (ref 150–400)
RBC: 4.88 MIL/uL (ref 3.87–5.11)
RDW: 13.1 % (ref 11.5–15.5)
WBC: 6.5 10*3/uL (ref 4.0–10.5)

## 2012-06-26 LAB — APTT: aPTT: 35 seconds (ref 24–37)

## 2012-06-26 MED ORDER — FENTANYL CITRATE 0.05 MG/ML IJ SOLN
INTRAMUSCULAR | Status: AC | PRN
  Administered 2012-06-26: 25 ug via INTRAVENOUS
  Administered 2012-06-26: 12.5 ug via INTRAVENOUS

## 2012-06-26 MED ORDER — HYDRALAZINE HCL 20 MG/ML IJ SOLN
INTRAMUSCULAR | Status: AC
  Filled 2012-06-26: qty 1

## 2012-06-26 MED ORDER — IOHEXOL 300 MG/ML  SOLN
150.0000 mL | Freq: Once | INTRAMUSCULAR | Status: AC | PRN
  Administered 2012-06-26: 70 mL via INTRA_ARTERIAL

## 2012-06-26 MED ORDER — FENTANYL CITRATE 0.05 MG/ML IJ SOLN
INTRAMUSCULAR | Status: AC
  Filled 2012-06-26: qty 4

## 2012-06-26 MED ORDER — HEPARIN SODIUM (PORCINE) 1000 UNIT/ML IJ SOLN
INTRAMUSCULAR | Status: AC | PRN
  Administered 2012-06-26 (×2): 500 [IU] via INTRAVENOUS

## 2012-06-26 MED ORDER — MIDAZOLAM HCL 5 MG/5ML IJ SOLN
INTRAMUSCULAR | Status: AC | PRN
  Administered 2012-06-26: 0.5 mg via INTRAVENOUS
  Administered 2012-06-26: 1 mg via INTRAVENOUS

## 2012-06-26 MED ORDER — MIDAZOLAM HCL 2 MG/2ML IJ SOLN
INTRAMUSCULAR | Status: AC
  Filled 2012-06-26: qty 4

## 2012-06-26 MED ORDER — SODIUM CHLORIDE 0.9 % IV SOLN: INTRAVENOUS | Status: AC

## 2012-06-26 MED ORDER — SODIUM CHLORIDE 0.9 % IV SOLN: Freq: Once | INTRAVENOUS | Status: DC

## 2012-06-26 NOTE — Progress Notes (Signed)
UP AND WALKED AND TOLERATED WELL; RIGHT GROIN STABLE;NO BLEEDING OR HEMATOMA 

## 2012-06-26 NOTE — Procedures (Signed)
S/P bilateral common carotid arteriogram and lt vertebral arteriogram . Rt CFA approach  Preliminary findings .1.60 -70 % stenosis Lt CCA mid seg. 2.60 - 65 % stenosis Lt ICA cavernous seg.. 3. 4.14mmx 3mm RT ICA petrous cav junctn aneurysm

## 2012-06-26 NOTE — H&P (Signed)
Chief Complaint: Hx CVA HPI: [name] [name] is an 66 y.o. female with hx of prior CVA and has had multiple cerebral angiograms. This began in 2010 with an angiogram and Rt MCA balloon angioplasty and she has had follow up angiograms since then. She denies any new medical hx. Denies any new CVA,TIA sxs. She continues to take her ASA and Plavix daily. She also does continue to smoke. She denies any recent illnesses, fevers, diarrhea, dysuria  Past Medical History:  Past Medical History  Diagnosis Date  . Hypertension   . Stroke   . Peripheral vascular disease   . COPD (chronic obstructive pulmonary disease)   . Hyperlipidemia     Past Surgical History:  Past Surgical History  Procedure Date  . Cholecystectomy   . Appendectomy   . Cesarean section     Family History: History reviewed. No pertinent family history.  Social History:  reports that she has been smoking.  She does not have any smokeless tobacco history on file. She reports that she does not drink alcohol or use illicit drugs.  Allergies: No Known Allergies  Medications: aspirin EC 81 MG tablet (Taking) Sig - Route: Take 81 mg by mouth daily. - Oral Class: Historical Med Number of times this order has been changed since signing: 1 Order [address] benazepril (LOTENSIN) 10 MG tablet (Taking) Sig - Route: Take 10 mg by mouth daily. - Oral Class: Historical Med Number of times this order has been changed since signing: 1 Order [address] clopidogrel (PLAVIX) 75 MG tablet (Taking) Sig - Route: Take 75 mg by mouth daily. - Oral Class: Historical Med Number of times this order has been changed since signing: 1 Order [address] cyclobenzaprine (FLEXERIL) 5 MG tablet (Taking) Sig - Route: Take 5 mg by mouth at bedtime as needed. For muscle spasms - Oral Class: Historical Med Number of times this order has been changed since signing: 2 Order [address] HYDROcodone-acetaminophen (NORCO/VICODIN) 5-325 MG per tablet (Taking) Sig -  Route: Take 1 tablet by mouth every 8 (eight) hours as needed. For pain - Oral Class: Historical Med  Please HPI for pertinent positives, otherwise complete 10 system ROS negative.  Physical Exam: Blood pressure 168/93, pulse 94, temperature 97.9 F (36.6 C), temperature source Oral, resp. rate 18, height 5' (1.524 m), weight 160 lb (72.576 kg), SpO2 98.00%. Body mass index is 31.25 kg/(m^2).   General Appearance:  Alert, cooperative, no distress, appears stated age  Head:  Normocephalic, without obvious abnormality, atraumatic  ENT: Unremarkable  Neck: Supple, symmetrical, trachea midline, no adenopathy, thyroid: not enlarged, symmetric, no tenderness/mass/nodules  Lungs:   Clear to auscultation bilaterally, no w/r/r, respirations unlabored without use of accessory muscles.  Heart:  Regular rate and rhythm, S1, S2 normal, no murmur, rub or gallop. Carotids 2+ without bruit.  Abdomen:   Soft, non-tender, non distended. Bowel sounds active all four quadrants,  no masses, no organomegaly.  Extremities: Extremities normal, atraumatic, no cyanosis or edema  Pulses: 2+ femoral pulses, pedal pulses faint but palpable.  Neurologic: Normal affect, no gross deficits.   No results found for this or any previous visit (from the past 48 hour(s)). No results found.  Assessment/Plan HX CVA and known (L)ICA stenosis and Rt MCA stenosis For follow up angiogram today Reviewed procedure and risks. Labs pending Consent signed in chart. Advised pt to STOP SMOKING  Brayton El PA-C 06/26/2012, 8:47 AM

## 2012-06-27 ENCOUNTER — Telehealth (HOSPITAL_COMMUNITY): Payer: Self-pay | Admitting: *Deleted

## 2012-07-23 ENCOUNTER — Other Ambulatory Visit (HOSPITAL_COMMUNITY): Payer: Self-pay | Admitting: Nurse Practitioner

## 2012-07-23 DIAGNOSIS — Z09 Encounter for follow-up examination after completed treatment for conditions other than malignant neoplasm: Secondary | ICD-10-CM

## 2012-07-23 DIAGNOSIS — I639 Cerebral infarction, unspecified: Secondary | ICD-10-CM

## 2012-07-23 NOTE — Addendum Note (Signed)
Addended by: Scharlene Corn B on: 07/23/2012 09:34 AM   Modules accepted: Orders

## 2012-11-13 ENCOUNTER — Encounter: Payer: Self-pay | Admitting: Family Medicine

## 2012-11-13 ENCOUNTER — Ambulatory Visit (INDEPENDENT_AMBULATORY_CARE_PROVIDER_SITE_OTHER): Payer: Medicare Other | Admitting: Family Medicine

## 2012-11-13 VITALS — BP 142/94 | HR 88 | Temp 98.4°F | Ht 61.0 in | Wt 162.5 lb

## 2012-11-13 DIAGNOSIS — F3289 Other specified depressive episodes: Secondary | ICD-10-CM

## 2012-11-13 DIAGNOSIS — I639 Cerebral infarction, unspecified: Secondary | ICD-10-CM

## 2012-11-13 DIAGNOSIS — I10 Essential (primary) hypertension: Secondary | ICD-10-CM

## 2012-11-13 DIAGNOSIS — I739 Peripheral vascular disease, unspecified: Secondary | ICD-10-CM

## 2012-11-13 DIAGNOSIS — I6350 Cerebral infarction due to unspecified occlusion or stenosis of unspecified cerebral artery: Secondary | ICD-10-CM

## 2012-11-13 DIAGNOSIS — E785 Hyperlipidemia, unspecified: Secondary | ICD-10-CM

## 2012-11-13 DIAGNOSIS — F329 Major depressive disorder, single episode, unspecified: Secondary | ICD-10-CM

## 2012-11-13 DIAGNOSIS — M1990 Unspecified osteoarthritis, unspecified site: Secondary | ICD-10-CM

## 2012-11-13 DIAGNOSIS — F32A Depression, unspecified: Secondary | ICD-10-CM

## 2012-11-13 DIAGNOSIS — F17200 Nicotine dependence, unspecified, uncomplicated: Secondary | ICD-10-CM

## 2012-11-13 DIAGNOSIS — J449 Chronic obstructive pulmonary disease, unspecified: Secondary | ICD-10-CM

## 2012-11-13 DIAGNOSIS — J4489 Other specified chronic obstructive pulmonary disease: Secondary | ICD-10-CM

## 2012-11-13 MED ORDER — ATORVASTATIN CALCIUM 40 MG PO TABS: 40.0000 mg | ORAL_TABLET | Freq: Every day | ORAL | Status: DC

## 2012-11-13 NOTE — Progress Notes (Signed)
Subjective:    Patient ID: [name] [name], female    DOB: [birth year], 67 y.o.   MRN: [id number]  HPI CC: new pt to establish  [place] [place]  H/o CVA x3.  Trouble doing this she used to be able to do.  Endorses residual R weakness and tremors worse since stroke.  Has seen Dr. Corliss Skains IR with cerebral angioplasty?  Requests handicap placard for recent stroke. Not on statin - was removed by prior PCP.  Some concern for elevated liver functions.  Has seen Dr. Daleen Squibb in past (cards).  Unsure why she saw him.  COPD - longstanding dyspnea and cough/wheezing.   Smoker - currently about 1 pack per week.  Planning on quitting.  Bought patches today.    H/o broken hip 2006, residual unsteady gait.  Does not use cane regularly. + falls recently.  Preventative:  Last CPE >1 yr ago Well woman exam - prior PCP. Pap smear 2013 - normal.  Always normal. Mammogram 2013 - normal. Colonoscopy 2010 - normal per pt ([place])  Lives with granddaughter Occupation: retired, was CNA  Medications and allergies reviewed and updated in chart.  Past histories reviewed and updated if relevant as below. There is no problem list on file for this patient.  Past Medical History  Diagnosis Date  . Hypertension   . Stroke     x3 residual R hemiparesis  . Peripheral vascular disease   . COPD (chronic obstructive pulmonary disease)   . Hyperlipidemia   . Osteoarthritis     h/o ruptured disc s/p ESI  . Depression   . Smoker   . History of chicken pox    Past Surgical History  Procedure Laterality Date  . Cholecystectomy  1970's  . Appendectomy  1960's  . Cesarean section    . Cataract extraction      bilaterl   History  Substance Use Topics  . Smoking status: Current Every Day Smoker -- 0.25 packs/day    Types: Cigarettes  . Smokeless tobacco: Never Used  . Alcohol Use: No   Family History  Problem Relation Age of Onset  . CAD Mother     MI  . CAD Maternal Aunt      MI  . CAD Sister     MI  . Sudden death Father 63    died in his sleep, chain smoker  . Cirrhosis Father   . Cancer Mother     cervical  . Cancer Mother     cervical  . Hypertension Daughter   . Diabetes Maternal Aunt    No Known Allergies Current Outpatient Prescriptions on File Prior to Visit  Medication Sig Dispense Refill  . aspirin EC 81 MG tablet Take 81 mg by mouth daily.      . benazepril (LOTENSIN) 10 MG tablet Take 10 mg by mouth daily.      . clopidogrel (PLAVIX) 75 MG tablet Take 75 mg by mouth daily.       No current facility-administered medications on file prior to visit.      Review of Systems  Constitutional: Negative for fever, chills, activity change, appetite change, fatigue and unexpected weight change.  HENT: Negative for hearing loss and neck pain.   Eyes: Negative for visual disturbance.  Respiratory: Positive for cough, shortness of breath and wheezing. Negative for chest tightness.   Cardiovascular: Negative for chest pain, palpitations and leg swelling.  Gastrointestinal: Negative for nausea, vomiting, abdominal pain, diarrhea, constipation,  blood in stool and abdominal distention.  Genitourinary: Negative for hematuria and difficulty urinating.  Musculoskeletal: Positive for myalgias. Negative for arthralgias.  Skin: Negative for rash.  Neurological: Negative for dizziness, seizures, syncope and headaches.  Psychiatric/Behavioral: Positive for dysphoric mood. The patient is not nervous/anxious.        Objective:   Physical Exam  Nursing note and vitals reviewed. Constitutional: She is oriented to person, place, and time. She appears well-developed and well-nourished. No distress.  HENT:  Head: Normocephalic and atraumatic.  Right Ear: Hearing, tympanic membrane, external ear and ear canal normal.  Left Ear: Hearing, tympanic membrane, external ear and ear canal normal.  Nose: Nose normal.  Mouth/Throat: Oropharynx is clear and moist. No  oropharyngeal exudate.  Eyes: Conjunctivae and EOM are normal. Pupils are equal, round, and reactive to light. No scleral icterus.  Neck: Normal range of motion. Neck supple. Carotid bruit is present (L sided).  Cardiovascular: Normal rate, regular rhythm and intact distal pulses.   Murmur (faint systolic) heard. Pulses:      Radial pulses are 2+ on the right side, and 2+ on the left side.  Pulmonary/Chest: Effort normal and breath sounds normal. No respiratory distress. She has no wheezes. She has no rales.  Abdominal: Soft. Bowel sounds are normal. She exhibits no distension and no mass. There is no tenderness. There is no rebound and no guarding.  Musculoskeletal: Normal range of motion. She exhibits no edema.  Lymphadenopathy:    She has no cervical adenopathy.  Neurological: She is alert and oriented to person, place, and time.  CN grossly intact, station and gait intact  Skin: Skin is warm and dry. No rash noted.  Psychiatric: She has a normal mood and affect. Her behavior is normal. Judgment and thought content normal.       Assessment & Plan:

## 2012-11-13 NOTE — Patient Instructions (Signed)
Let's restart lipitor (atorvastatin) at 40mg  daily.  I've sent this into pharmacy. I've requested records from prior PCP urgent care. Return at your convenience fasting for blood work - we will check cholesterol levels then.  Afterwards return for medicare wellness visit Also return at your convenience for spirometry to check on COPD.

## 2012-11-14 ENCOUNTER — Encounter: Payer: Self-pay | Admitting: Family Medicine

## 2012-11-14 DIAGNOSIS — Z87891 Personal history of nicotine dependence: Secondary | ICD-10-CM | POA: Insufficient documentation

## 2012-11-14 DIAGNOSIS — I739 Peripheral vascular disease, unspecified: Secondary | ICD-10-CM | POA: Insufficient documentation

## 2012-11-14 DIAGNOSIS — J449 Chronic obstructive pulmonary disease, unspecified: Secondary | ICD-10-CM | POA: Insufficient documentation

## 2012-11-14 DIAGNOSIS — E785 Hyperlipidemia, unspecified: Secondary | ICD-10-CM | POA: Insufficient documentation

## 2012-11-14 DIAGNOSIS — I10 Essential (primary) hypertension: Secondary | ICD-10-CM | POA: Insufficient documentation

## 2012-11-14 DIAGNOSIS — F331 Major depressive disorder, recurrent, moderate: Secondary | ICD-10-CM | POA: Insufficient documentation

## 2012-11-14 DIAGNOSIS — Z8673 Personal history of transient ischemic attack (TIA), and cerebral infarction without residual deficits: Secondary | ICD-10-CM | POA: Insufficient documentation

## 2012-11-14 DIAGNOSIS — M1990 Unspecified osteoarthritis, unspecified site: Secondary | ICD-10-CM | POA: Insufficient documentation

## 2012-11-14 NOTE — Assessment & Plan Note (Signed)
Continue to encourage cessation. Action phase. Has bought nicotine patches and planning on starting to use. Currently 1 pack per week.

## 2012-11-14 NOTE — Assessment & Plan Note (Signed)
Recommended restart lipitor 40mg  daily (prior on 80mg  daily) given h/o CVA. Question of transaminitis - will check when returns fasting. We have requested recent blood work and records from [facility].

## 2012-11-14 NOTE — Assessment & Plan Note (Addendum)
?   Of L carotid bruit today - states she recently had imaging done showing carotid blockages (by IR in hospital) -  Latest angiogram 06/2012: IMPRESSION  1. Interval progression of left common carotid artery stenosis proximally to 67-70% from the previously less than 20%.  2. Approximately 65% stenosis of the left internal carotid artery in the caval segment, and of 50% in the left ICA supraclinoid segment. These remain stable.  3. Stable right middle cerebral artery stenosis of 70%.  4. Approximately 4.5 mm x 3.5 mm saccular aneurysm arising in the right internal carotid artery petrous cavernous segment.  5. 50% stenosis of the right internal carotid artery at the bulb.  Rec: rpt [place] 6 mo and angio 1 yr

## 2012-11-14 NOTE — Assessment & Plan Note (Signed)
Situational after CVA.  Prior on antidepressant, no longer.

## 2012-11-14 NOTE — Assessment & Plan Note (Signed)
Not currently on medications. Has not had spirometry recently. I recommended she return for in office spirometry for severity of COPD. Continued to encourage smoking cessation.

## 2012-11-14 NOTE — Assessment & Plan Note (Signed)
Chronic.  Slightly elevated today, likely attributable to new office and doctor.  No changes - recheck next visit. BP Readings from Last 3 Encounters:  11/13/12 142/94  06/26/12 164/94

## 2012-11-18 ENCOUNTER — Other Ambulatory Visit: Payer: Self-pay | Admitting: Family Medicine

## 2012-11-18 DIAGNOSIS — I10 Essential (primary) hypertension: Secondary | ICD-10-CM

## 2012-11-18 DIAGNOSIS — E785 Hyperlipidemia, unspecified: Secondary | ICD-10-CM

## 2012-11-19 ENCOUNTER — Other Ambulatory Visit (INDEPENDENT_AMBULATORY_CARE_PROVIDER_SITE_OTHER): Payer: Medicare Other

## 2012-11-19 DIAGNOSIS — I10 Essential (primary) hypertension: Secondary | ICD-10-CM

## 2012-11-19 DIAGNOSIS — E785 Hyperlipidemia, unspecified: Secondary | ICD-10-CM

## 2012-11-19 LAB — COMPREHENSIVE METABOLIC PANEL
ALT: 13 U/L (ref 0–35)
AST: 17 U/L (ref 0–37)
Albumin: 3.9 g/dL (ref 3.5–5.2)
Alkaline Phosphatase: 82 U/L (ref 39–117)
BUN: 24 mg/dL — ABNORMAL HIGH (ref 6–23)
CO2: 27 mEq/L (ref 19–32)
Calcium: 9.4 mg/dL (ref 8.4–10.5)
Chloride: 104 mEq/L (ref 96–112)
Creatinine, Ser: 0.9 mg/dL (ref 0.4–1.2)
GFR: 64.88 mL/min (ref >60.00)
Glucose, Bld: 95 mg/dL (ref 70–99)
Potassium: 4.7 mEq/L (ref 3.5–5.1)
Sodium: 138 mEq/L (ref 135–145)
Total Bilirubin: 0.4 mg/dL (ref 0.3–1.2)
Total Protein: 7.5 g/dL (ref 6.0–8.3)

## 2012-11-19 LAB — LIPID PANEL
Cholesterol: 253 mg/dL — ABNORMAL HIGH (ref 0–200)
HDL: 48.1 mg/dL (ref >39.00)
Total CHOL/HDL Ratio: 5
Triglycerides: 131 mg/dL (ref 0.0–149.0)
VLDL: 26.2 mg/dL (ref 0.0–40.0)

## 2012-11-19 LAB — TSH: TSH: 2.16 u[IU]/mL (ref 0.35–5.50)

## 2012-11-19 LAB — LDL CHOLESTEROL, DIRECT: Direct LDL: 163.9 mg/dL

## 2012-11-25 ENCOUNTER — Encounter: Payer: Self-pay | Admitting: Family Medicine

## 2012-11-26 ENCOUNTER — Ambulatory Visit (INDEPENDENT_AMBULATORY_CARE_PROVIDER_SITE_OTHER): Payer: Medicare Other | Admitting: Family Medicine

## 2012-11-26 ENCOUNTER — Encounter: Payer: Self-pay | Admitting: Family Medicine

## 2012-11-26 ENCOUNTER — Other Ambulatory Visit (HOSPITAL_COMMUNITY)
Admission: RE | Admit: 2012-11-26 | Discharge: 2012-11-26 | Disposition: A | Discharge status: Home / Self Care | Payer: Medicare Other | Source: Ambulatory Visit | Attending: Family Medicine | Admitting: Family Medicine

## 2012-11-26 VITALS — BP 136/84 | HR 78 | Temp 98.1°F | Ht 61.0 in | Wt 164.5 lb

## 2012-11-26 DIAGNOSIS — N889 Noninflammatory disorder of cervix uteri, unspecified: Secondary | ICD-10-CM

## 2012-11-26 DIAGNOSIS — Z0000 Encounter for general adult medical examination without abnormal findings: Secondary | ICD-10-CM

## 2012-11-26 DIAGNOSIS — J449 Chronic obstructive pulmonary disease, unspecified: Secondary | ICD-10-CM

## 2012-11-26 DIAGNOSIS — Z1151 Encounter for screening for human papillomavirus (HPV): Secondary | ICD-10-CM | POA: Insufficient documentation

## 2012-11-26 DIAGNOSIS — Z23 Encounter for immunization: Secondary | ICD-10-CM

## 2012-11-26 DIAGNOSIS — F329 Major depressive disorder, single episode, unspecified: Secondary | ICD-10-CM

## 2012-11-26 DIAGNOSIS — F32A Depression, unspecified: Secondary | ICD-10-CM

## 2012-11-26 DIAGNOSIS — Z87891 Personal history of nicotine dependence: Secondary | ICD-10-CM

## 2012-11-26 DIAGNOSIS — Z01419 Encounter for gynecological examination (general) (routine) without abnormal findings: Secondary | ICD-10-CM | POA: Insufficient documentation

## 2012-11-26 DIAGNOSIS — E785 Hyperlipidemia, unspecified: Secondary | ICD-10-CM

## 2012-11-26 DIAGNOSIS — I10 Essential (primary) hypertension: Secondary | ICD-10-CM

## 2012-11-26 MED ORDER — TRAZODONE HCL 50 MG PO TABS: 25.0000 mg | ORAL_TABLET | Freq: Every evening | ORAL | Status: DC | PRN

## 2012-11-26 NOTE — Assessment & Plan Note (Addendum)
I have personally reviewed the Medicare Annual Wellness questionnaire and have noted 1. The patient's medical and social history 2. Their use of alcohol, tobacco or illicit drugs 3. Their current medications and supplements 4. The patient's functional ability including ADL's, fall risks, home safety risks and hearing or visual impairment. 5. Diet and physical activity 6. Evidence for depression or mood disorders The patients weight, height, BMI have been recorded in the chart.  Hearing and vision has been addressed. I have made referrals, counseling and provided education to the patient based review of the above and I have provided the pt with a written personalized care plan for preventive services. See scanned questionairre. Advanced directives discussed: packet provided.  Pt interested in setting this up.  Would want daughter Alexia Freestone to be HCPOA.  Reviewed preventative protocols and updated unless pt declined. PHQ9 = 15/27.  See below. UTD colonoscopy, to schedule mammogram this year at [facility] center as due (last 2012). Pap/breast exam today. At risk of falls 2/2 h/o CVA with residual hemiparesis

## 2012-11-26 NOTE — Progress Notes (Signed)
Subjective:    Patient ID: [name] [name], female    DOB: November 12, 1945, 67 y.o.   MRN: [id number]  HPI CC: medicare wellness visit, initial  Presents with daughter today.  H/o mult CVAs, compliant with plavix and aspirin.  Quit smoking about 2 wks ago and on patch currently.  Has quit once in past.  Preventative:  Well woman exam - prior PCP.  Pap smear 2013 - normal. Abnormal once - saw GYN (10+ yrs ago).  Strong fmhx cervical cancer. Mammogram 2013 - normal. Colonoscopy 2010 - normal per pt ([place]) dexa - last done 11/2010 - received letter saying due for repeat from [facility].  Unsure what last study showed.  Will request today. Pneumovax - last done >5 yrs ago.  Due will receive today. Flu shot - declines. Tetanus - 2006 zostavax - interested. Advanced directives discussed - interested in setting up.  Provided with packet.  Would want daughter Alexia Freestone to be HCPOA.  Hearing screen passed Vision screen failed.  S/p bilat cataract surgery.  Due for recheck, will schedule.  2+ falls in last year.  Loses balance.  Trouble with steps without rails.  R residual hemiparesis.  Tends to drag R foot Denies depression/anxiethy, anhedonia.  Medications and allergies reviewed and updated in chart.  Past histories reviewed and updated if relevant as below. Patient Active Problem List  Diagnosis  . Hypertension  . Stroke  . Peripheral vascular disease  . COPD (chronic obstructive pulmonary disease)  . Hyperlipidemia  . Osteoarthritis  . Depression  . Smoker  . Medicare annual wellness visit, initial   Past Medical History  Diagnosis Date  . Hypertension   . Stroke 2010    x3 with residual R hemiparesis, s/p R MCA balloon angioplasty (2010)  . Peripheral vascular disease   . COPD (chronic obstructive pulmonary disease)   . Hyperlipidemia   . Osteoarthritis     h/o ruptured disc s/p ESI  . Depression   . Smoker     quit 10/2012  . History of chicken pox   . Left carotid  stenosis   . Lower back pain     h/o HNP s/p surgery   Past Surgical History  Procedure Laterality Date  . Cholecystectomy  1970's  . Appendectomy  1960's  . Cesarean section    . Cataract extraction      bilateral  . L hip surgery  2006    fractured - screws placed   History  Substance Use Topics  . Smoking status: Current Every Day Smoker -- 0.25 packs/day    Types: Cigarettes  . Smokeless tobacco: Never Used  . Alcohol Use: No   Family History  Problem Relation Age of Onset  . CAD Mother     MI  . CAD Maternal Aunt     MI  . CAD Sister     MI  . Sudden death Father 48    died in his sleep, chain smoker  . Cirrhosis Father   . Cancer Mother     cervical  . Cancer Mother     cervical  . Hypertension Daughter   . Diabetes Maternal Aunt    No Known Allergies Current Outpatient Prescriptions on File Prior to Visit  Medication Sig Dispense Refill  . albuterol (PROVENTIL HFA;VENTOLIN HFA) 108 (90 BASE) MCG/ACT inhaler Inhale 2 puffs into the lungs every 6 (six) hours as needed for wheezing.      [name] aspirin EC 81 MG tablet Take 81 mg  by mouth daily.      [name] atorvastatin (LIPITOR) 40 MG tablet Take 1 tablet (40 mg total) by mouth daily.  90 tablet  3  . benazepril (LOTENSIN) 10 MG tablet Take 10 mg by mouth daily.      . clopidogrel (PLAVIX) 75 MG tablet Take 75 mg by mouth daily.       No current facility-administered medications on file prior to visit.     Review of Systems  Constitutional: Negative for fever, chills, activity change, appetite change, fatigue and unexpected weight change.  HENT: Negative for hearing loss and neck pain.   Eyes: Negative for visual disturbance.  Respiratory: Positive for shortness of breath and wheezing. Negative for cough (improved - quit smoking 2 wks ago!) and chest tightness.   Cardiovascular: Negative for chest pain, palpitations and leg swelling.  Gastrointestinal: Negative for nausea, vomiting, abdominal pain, diarrhea,  constipation, blood in stool and abdominal distention.  Genitourinary: Negative for hematuria and difficulty urinating.  Musculoskeletal: Positive for myalgias. Negative for arthralgias.  Skin: Negative for rash.  Neurological: Negative for dizziness, seizures, syncope and headaches.  Hematological: Does not bruise/bleed easily.  Psychiatric/Behavioral: Positive for dysphoric mood. The patient is not nervous/anxious.        Objective:   Physical Exam  Nursing note and vitals reviewed. Constitutional: She is oriented to person, place, and time. She appears well-developed and well-nourished. No distress.  HENT:  Head: Normocephalic and atraumatic.  Right Ear: External ear normal.  Left Ear: External ear normal.  Nose: Nose normal.  Mouth/Throat: Oropharynx is clear and moist. No oropharyngeal exudate.  Eyes: Conjunctivae and EOM are normal. Pupils are equal, round, and reactive to light. No scleral icterus.  Neck: Normal range of motion. Neck supple. Carotid bruit is present (known left). No thyromegaly present.  Cardiovascular: Normal rate, regular rhythm, normal heart sounds and intact distal pulses.   No murmur heard. Pulses:      Radial pulses are 2+ on the right side, and 2+ on the left side.  Pulmonary/Chest: Effort normal and breath sounds normal. No respiratory distress. She has no wheezes. She has no rales. Right breast exhibits no inverted nipple, no mass, no nipple discharge, no skin change and no tenderness. Left breast exhibits no inverted nipple, no mass, no nipple discharge, no skin change and no tenderness. Breasts are symmetrical.  Abdominal: Soft. Bowel sounds are normal. She exhibits no distension and no mass. There is no tenderness. There is no rebound and no guarding.  Genitourinary: Vagina normal and uterus normal. Pelvic exam was performed with patient supine. No labial fusion. There is no rash, tenderness, lesion or injury on the right labia. There is no rash,  tenderness, lesion or injury on the left labia. Cervix exhibits no motion tenderness and no friability. Right adnexum displays no mass and no tenderness. Left adnexum displays no mass and no tenderness. No erythema, tenderness or bleeding around the vagina. No foreign body around the vagina. No signs of injury around the vagina. No vaginal discharge found.  Erythematous patches on cervix but not friable  Musculoskeletal: Normal range of motion. She exhibits no edema.  Lymphadenopathy:    She has no cervical adenopathy.    She has no axillary adenopathy.       Right axillary: No lateral adenopathy present.       Left axillary: No lateral adenopathy present.      Right: No supraclavicular adenopathy present.       Left: No supraclavicular adenopathy  present.  Neurological: She is alert and oriented to person, place, and time.  CN grossly intact, station and gait intact  Skin: Skin is warm and dry. No rash noted.  Psychiatric: She has a normal mood and affect. Her behavior is normal. Judgment and thought content normal.       Assessment & Plan:

## 2012-11-26 NOTE — Patient Instructions (Addendum)
Have [facility] fax over last bone density scan - our fax number is [phone]. Call your insurance about the shingles shot to see if it is covered or how much it would cost and where is cheaper (here or pharmacy).  If you want to receive here, call for nurse visit.  Pneumonia shot today. Schedule eye exam. We will call you with results of pap smear - if you haven't heard from [place] within 2 weeks then give [place] a call. Schedule mammogram when next due. May do trial of trazodone for both sleep and mood at night. 1/2 to 1 pill nightly.

## 2012-11-27 ENCOUNTER — Encounter: Payer: Self-pay | Admitting: Family Medicine

## 2012-11-27 DIAGNOSIS — N889 Noninflammatory disorder of cervix uteri, unspecified: Secondary | ICD-10-CM | POA: Insufficient documentation

## 2012-11-27 NOTE — Assessment & Plan Note (Signed)
On lipitor 40mg  daily. Reviewed #s.  No h/o transaminitis per prior PCP records.

## 2012-11-27 NOTE — Assessment & Plan Note (Signed)
Chronic, stable. BP Readings from Last 3 Encounters:  11/26/12 136/84  11/13/12 142/94  06/26/12 164/94

## 2012-11-27 NOTE — Assessment & Plan Note (Signed)
Await pap smear results.

## 2012-11-27 NOTE — Assessment & Plan Note (Signed)
Planning to return for spirometry.

## 2012-11-27 NOTE — Assessment & Plan Note (Signed)
PHQ9 = 15. Also endorsed trouble with insomnia. Interested in antidepressant. Will start trazodone low dose at night.

## 2012-11-27 NOTE — Assessment & Plan Note (Signed)
Congratulated and encouraged continued abstinence. Quit 10/2012

## 2012-11-29 ENCOUNTER — Encounter: Payer: Self-pay | Admitting: Family Medicine

## 2012-11-29 ENCOUNTER — Other Ambulatory Visit: Payer: Self-pay | Admitting: Family Medicine

## 2012-11-29 MED ORDER — DOXYCYCLINE HYCLATE 100 MG PO CAPS: 100.0000 mg | ORAL_CAPSULE | Freq: Two times a day (BID) | ORAL | Status: DC

## 2012-12-04 ENCOUNTER — Ambulatory Visit (INDEPENDENT_AMBULATORY_CARE_PROVIDER_SITE_OTHER): Payer: Medicare Other | Admitting: Family Medicine

## 2012-12-04 ENCOUNTER — Encounter: Payer: Self-pay | Admitting: Family Medicine

## 2012-12-04 ENCOUNTER — Telehealth: Payer: Self-pay | Admitting: Family Medicine

## 2012-12-04 VITALS — BP 104/82 | HR 68 | Temp 98.4°F

## 2012-12-04 DIAGNOSIS — F3289 Other specified depressive episodes: Secondary | ICD-10-CM

## 2012-12-04 DIAGNOSIS — F32A Depression, unspecified: Secondary | ICD-10-CM

## 2012-12-04 DIAGNOSIS — R197 Diarrhea, unspecified: Secondary | ICD-10-CM

## 2012-12-04 DIAGNOSIS — F329 Major depressive disorder, single episode, unspecified: Secondary | ICD-10-CM

## 2012-12-04 MED ORDER — AZITHROMYCIN 250 MG PO TABS: ORAL_TABLET | ORAL | Status: DC

## 2012-12-04 NOTE — Patient Instructions (Addendum)
Stop the doxycycline.  Don't take it again.  The abdominal pain should gradually improve.  If it doesn't improve or if it gets worse over the next few days, then notify us.  When you are feeling better, start the azithromycin.  Take care.  Get some rest and drink plenty of fluids.

## 2012-12-04 NOTE — Telephone Encounter (Signed)
Patient Information:  Caller Name: [name] [name]  Phone: [phone]  Patient: [name] [name], [name] [name]  Gender: Female  DOB: Apr 10, 1946  Age: 67 Years  PCP: [name] [facility])  Office Follow Up:  Does the office need to follow up with this patient?: No  Instructions For The Office: N/A  RN Note:  Continues to have diarrhea on the antibiotic.  Wonders if should continue to take the antibiotic.  States she is taking doxycycline 100mg  BID since 11/29/12.  c/o constant upper abdominal pain > 2 hours 12/04/12.  Per protocol, advised appt within 4 hours; appt scheduled first patient availability 1400 12/04/12 with Dr. Para March.  krs/can  Symptoms  Reason For Call & Symptoms: Rx for antibiotic given after PAP smear came back "bad."  States having severe diarrhea on this antibiotic, having accidents.  Reviewed Health History In EMR: Yes  Reviewed Medications In EMR: Yes  Reviewed Allergies In EMR: Yes  Reviewed Surgeries / Procedures: Yes  Date of Onset of Symptoms: 12/01/2012  Guideline(s) Used:  Diarrhea  Disposition Per Guideline:   Go to Office Now  Reason For Disposition Reached:   Constant abdominal pain lasting > 2 hours  Advice Given:  N/A  Appointment Scheduled:  12/04/2012 14:00:00 Appointment Scheduled Provider:  Crawford Givens Clelia Croft) [facility])

## 2012-12-04 NOTE — Telephone Encounter (Signed)
Noted. Appreciated Dr. Algis Downs seeing today.  Spoke with him re pt

## 2012-12-04 NOTE — Progress Notes (Signed)
Prev documented with likely actinomyces on pap.  Started on doxy.  Abd pain and watery diarrhea started about 1 day after starting doxycycline (diarrhea started on 11/30/12).  Some L sided abd pain.  Present all the time.  Last dose of doxy was yesterday AM. Diarrhea stopped in meantime.  No fevers.  Pain is noticeable but not severe.  Vomiting prev, day before yesterday, but has been able to eat in meantime.  She is SOB at baseline.  She is tired, has been for the last few days.  No blood in stool.  Dark stools happened only after taking pepto.    Meds, vitals, and allergies reviewed.   ROS: See HPI.  Otherwise, noncontributory.  nad ncat Mmm rrr ctab abd soft, minimally sore on L abd but not worse with palpation No rebound, normal BS

## 2012-12-04 NOTE — Assessment & Plan Note (Signed)
She looks well.  Mild abd pain.  Would follow clinically.  If resolved over the next few days, would attempt trial of azithro.  Stop doxy now.  She agrees. Will route to PCP as FYI.

## 2012-12-20 ENCOUNTER — Other Ambulatory Visit: Payer: Self-pay | Admitting: Radiology

## 2012-12-20 DIAGNOSIS — I6529 Occlusion and stenosis of unspecified carotid artery: Secondary | ICD-10-CM

## 2012-12-25 DIAGNOSIS — J449 Chronic obstructive pulmonary disease, unspecified: Secondary | ICD-10-CM

## 2012-12-25 HISTORY — DX: Chronic obstructive pulmonary disease, unspecified: J44.9

## 2012-12-27 ENCOUNTER — Ambulatory Visit (HOSPITAL_COMMUNITY)
Admission: RE | Admit: 2012-12-27 | Discharge: 2012-12-27 | Disposition: A | Discharge status: Home / Self Care | Payer: Medicare Other | Source: Ambulatory Visit | Attending: Interventional Radiology | Admitting: Interventional Radiology

## 2012-12-27 DIAGNOSIS — I6529 Occlusion and stenosis of unspecified carotid artery: Secondary | ICD-10-CM

## 2012-12-27 NOTE — Progress Notes (Signed)
VASCULAR LAB PRELIMINARY  PRELIMINARY  PRELIMINARY  PRELIMINARY  Carotid Doppler completed.    Preliminary report:  40-59% Right ICA stenosis.  No significant left ICA stenosis.  Bilateral vertebral artery flow is antegrade.  Keirstan Iannello, RVT 12/27/2012, 11:24 AM

## 2013-01-01 ENCOUNTER — Telehealth (HOSPITAL_COMMUNITY): Payer: Self-pay | Admitting: Interventional Radiology

## 2013-01-03 ENCOUNTER — Other Ambulatory Visit: Payer: Self-pay | Admitting: *Deleted

## 2013-01-03 MED ORDER — BENAZEPRIL HCL 10 MG PO TABS: 10.0000 mg | ORAL_TABLET | Freq: Every day | ORAL | Status: DC

## 2013-01-10 ENCOUNTER — Ambulatory Visit (INDEPENDENT_AMBULATORY_CARE_PROVIDER_SITE_OTHER): Payer: Medicare Other | Admitting: Family Medicine

## 2013-01-10 ENCOUNTER — Encounter: Payer: Self-pay | Admitting: Family Medicine

## 2013-01-10 VITALS — BP 124/74 | HR 84 | Temp 98.0°F | Ht 61.5 in | Wt 165.5 lb

## 2013-01-10 DIAGNOSIS — J449 Chronic obstructive pulmonary disease, unspecified: Secondary | ICD-10-CM

## 2013-01-10 DIAGNOSIS — J441 Chronic obstructive pulmonary disease with (acute) exacerbation: Secondary | ICD-10-CM

## 2013-01-10 DIAGNOSIS — G4700 Insomnia, unspecified: Secondary | ICD-10-CM

## 2013-01-10 DIAGNOSIS — Z87891 Personal history of nicotine dependence: Secondary | ICD-10-CM

## 2013-01-10 MED ORDER — FORMOTEROL FUMARATE 12 MCG IN CAPS: 12.0000 ug | ORAL_CAPSULE | Freq: Two times a day (BID) | RESPIRATORY_TRACT | Status: DC

## 2013-01-10 NOTE — Assessment & Plan Note (Addendum)
Spirometry today: Pre: FVC 84%, FEV1 69%, ratio 0.64 consistent with moderate obstruction. Post: FVC 88%, FEV1 71%, ratio 0.63, overall unchanged I think best thing she did was quit smoking However, pt notices dyspnea at baseline and endorses frequent respiratory infections. Will start foradil - sent to pharmacy.  Discussed use.

## 2013-01-10 NOTE — Assessment & Plan Note (Signed)
congratulated on smoking cessation! Encouraged perseverence.

## 2013-01-10 NOTE — Patient Instructions (Addendum)
Ok to increase trazodone to 100mg  nightly. Spirometry today - testing consistent with moderate COPD.  Smoking cessation is best thing for this. As noticing worsening shortness of breath, would recommend starting long acting bronchodilator (see below).   Formoterol inhalation powder What is this medicine? FORMOTEROL (for MOH te rol) is a slow-acting bronchodilator. It helps to open up the airways of your lungs. This medicine is used to treat COPD and to prevent exercise-induced bronchospasm. It is also used to treat asthma in patients taking other asthma control medicines. This medicine should not be used alone for asthma. Do NOT use for an acute asthma attack. Do NOT use for a COPD attack. This medicine may be used for other purposes; ask your health care provider or pharmacist if you have questions. What should I tell my health care provider before I take this medicine? They need to know if you have any of the following conditions: -diabetes -have asthma and are not taking any other asthma medicine -heart disease or irregular heartbeat -high blood pressure -pheochromocytoma -seizures -thyroid disease -worsening asthma -an unusual or allergic reaction to formoterol, milk, other medicines, food, dyes, or preservatives -pregnant or trying to get pregnant -breast-feeding How should I use this medicine? The capsules are only for inhalation through an inhaler device. Do NOT swallow the capsules. Follow the directions on your prescription label. Do not use a spacer device. Do not use more often than directed. Make sure that you are using your inhaler correctly. Ask you doctor or health care provider if you have any questions. A special MedGuide will be given to you by the pharmacist with each prescription and refill. Be sure to read this information carefully each time. Talk to your pediatrician regarding the use of this medicine in children. While this drug may be prescribed for children as young  as 64 years old for selected conditions, precautions do apply. Overdosage: If you think you have taken too much of this medicine contact a poison control center or emergency room at once. NOTE: This medicine is only for you. Do not share this medicine with others. What if I miss a dose? If you miss a dose, use it as soon as you can. If it is almost time for your next dose, use only that dose. Do not use double or extra doses. What may interact with this medicine? Do not take this medicine with any of the following medications: -MAOIs like Carbex, Eldepryl, Marplan, Nardil, and Parnate This medicine may also interact with the following medications: -caffeine -cisapride -diuretics -furazolidone -medicines for blood pressure -medicines for depression, anxiety, or psychotic disturbances -other medicines for breathing problems -pimozide -procarbazine -risperidone -sertindole -some antibiotics like clarithromycin, erythromycin, levofloxacin, and linezolid -some heart medicines -steroid hormones like dexamethasone, cortisone, hydrocortisone -stimulant medicines for attention disorders, weight loss, or to stay awake This list may not describe all possible interactions. Give your health care provider a list of all the medicines, herbs, non-prescription drugs, or dietary supplements you use. Also tell them if you smoke, drink alcohol, or use illegal drugs. Some items may interact with your medicine. What should I watch for while using this medicine? Visit your doctor for regular check ups. Tell your doctor or health care professional if your symptoms do not get better. If your symptoms get worse or if you need your short-acting inhalers more often, call your doctor right away. Do not use this medicine more than every 12 hours. If you have asthma, be aware that using this medicine  may increase your risk of dying from asthma related problems. Talk to your doctor about the risks and benefits of taking  this medicine. NEVER use this medicine for an acute asthma attack. If you are going to have surgery tell your doctor or health care professional that you are using this medicine. What side effects may I notice from receiving this medicine? Side effects that you should report to your doctor or health care professional as soon as possible: -allergic reactions such as skin rash or itching, hives, swelling of the face, lips or tongue -chest pain -difficulty breathing or wheezing that increases or does not go away -dizziness or fainting -fever -irregular heartbeat -nervousness -tremors Side effects that usually do not require medical attention (report to your doctor or health care professional if they continue or are bothersome): -cough -headache -sore throat -stuffy nose -stomach upset This list may not describe all possible side effects. Call your doctor for medical advice about side effects. You may report side effects to FDA at 1-800-FDA-1088. Where should I keep my medicine? Keep out of the reach of children. Store at room temperature between 20 to 25 degrees C (68 and 77 degrees F). Throw away any unused medicine after the expiration date. Protect from heat and moisture. Remove capsules from the blister pack immediately before using them. Special rules describing how to store and how long you should keep your medicine may apply. Be sure to read the MedGuide that came with your prescription carefully and follow the directions for storing and using your medicine. NOTE: This sheet is a summary. It may not cover all possible information. If you have questions about this medicine, talk to your doctor, pharmacist, or health care provider.  2013, [employer]vier/Gold Standard. (04/16/2009 9:57:23 PM)

## 2013-01-10 NOTE — Progress Notes (Signed)
  Subjective:    Patient ID: [name] [name], female    DOB: April 21, 1946, 67 y.o.   MRN: [id number]  HPI CC: 3 mo f/u  Had 6 mo f/u carotid imaging by Dr. Corliss Skains - rec continued plavix and aspirin.  Insomnia - tried trazodone 75mg  nightly which has helped.  Noticing effect running out. Asks about increasing to 100mg  nightly.  Quit smoking about 1.5 mo ago.  Off patches.  Using lozenges prn nicotine craving.  H/o COPD - no recent spirometry.  Will recheck today.  Noticing staying somewhat dyspneic but overall improved after smoking cessation.  Occasional wheezing.  No longer coughing.  Rarely uses albuterol.  Past Medical History  Diagnosis Date  . Hypertension   . Stroke 2010    x3 with residual R hemiparesis, s/p R MCA balloon angioplasty (2010)  . Peripheral vascular disease   . COPD (chronic obstructive pulmonary disease)   . Hyperlipidemia   . Osteoarthritis     h/o ruptured disc s/p ESI  . Depression   . Smoker     quit 10/2012  . History of chicken pox   . Left carotid stenosis   . Lower back pain     h/o HNP s/p surgery  . Osteoporosis 11/2010    DEXA -2.7 spine     Review of Systems Per HPI    Objective:   Physical Exam  Nursing note and vitals reviewed. Constitutional: She appears well-developed and well-nourished. No distress.  HENT:  Head: Normocephalic and atraumatic.  Mouth/Throat: Oropharynx is clear and moist. No oropharyngeal exudate.  Eyes: Conjunctivae are normal. Pupils are equal, round, and reactive to light. No scleral icterus.  Cardiovascular: Normal rate, regular rhythm, normal heart sounds and intact distal pulses.   No murmur heard. Pulmonary/Chest: Effort normal and breath sounds normal. No respiratory distress. She has no wheezes. She has no rales.  Musculoskeletal: She exhibits no edema.  Skin: Skin is warm and dry. No rash noted.       Assessment & Plan:

## 2013-03-04 ENCOUNTER — Other Ambulatory Visit: Payer: Self-pay | Admitting: Family Medicine

## 2013-03-04 NOTE — Telephone Encounter (Signed)
Ok to refill 

## 2013-04-17 ENCOUNTER — Emergency Department (HOSPITAL_COMMUNITY)
Admission: EM | Admit: 2013-04-17 | Discharge: 2013-04-17 | Disposition: A | Discharge status: Home / Self Care | Payer: Medicare Other | Attending: Emergency Medicine | Admitting: Emergency Medicine

## 2013-04-17 ENCOUNTER — Encounter (HOSPITAL_COMMUNITY): Payer: Self-pay | Admitting: Emergency Medicine

## 2013-04-17 DIAGNOSIS — Z8673 Personal history of transient ischemic attack (TIA), and cerebral infarction without residual deficits: Secondary | ICD-10-CM | POA: Insufficient documentation

## 2013-04-17 DIAGNOSIS — Z8619 Personal history of other infectious and parasitic diseases: Secondary | ICD-10-CM | POA: Insufficient documentation

## 2013-04-17 DIAGNOSIS — X58XXXA Exposure to other specified factors, initial encounter: Secondary | ICD-10-CM

## 2013-04-17 DIAGNOSIS — Z7982 Long term (current) use of aspirin: Secondary | ICD-10-CM | POA: Insufficient documentation

## 2013-04-17 DIAGNOSIS — I10 Essential (primary) hypertension: Secondary | ICD-10-CM | POA: Insufficient documentation

## 2013-04-17 DIAGNOSIS — J449 Chronic obstructive pulmonary disease, unspecified: Secondary | ICD-10-CM | POA: Insufficient documentation

## 2013-04-17 DIAGNOSIS — M1990 Unspecified osteoarthritis, unspecified site: Secondary | ICD-10-CM | POA: Insufficient documentation

## 2013-04-17 DIAGNOSIS — Z7721 Contact with and (suspected) exposure to potentially hazardous body fluids: Secondary | ICD-10-CM | POA: Insufficient documentation

## 2013-04-17 DIAGNOSIS — Z87891 Personal history of nicotine dependence: Secondary | ICD-10-CM | POA: Insufficient documentation

## 2013-04-17 DIAGNOSIS — I739 Peripheral vascular disease, unspecified: Secondary | ICD-10-CM | POA: Insufficient documentation

## 2013-04-17 DIAGNOSIS — F3289 Other specified depressive episodes: Secondary | ICD-10-CM | POA: Insufficient documentation

## 2013-04-17 DIAGNOSIS — F329 Major depressive disorder, single episode, unspecified: Secondary | ICD-10-CM | POA: Insufficient documentation

## 2013-04-17 DIAGNOSIS — J4489 Other specified chronic obstructive pulmonary disease: Secondary | ICD-10-CM | POA: Insufficient documentation

## 2013-04-17 DIAGNOSIS — Z79899 Other long term (current) drug therapy: Secondary | ICD-10-CM | POA: Insufficient documentation

## 2013-04-17 DIAGNOSIS — Z8679 Personal history of other diseases of the circulatory system: Secondary | ICD-10-CM | POA: Insufficient documentation

## 2013-04-17 DIAGNOSIS — Z7902 Long term (current) use of antithrombotics/antiplatelets: Secondary | ICD-10-CM | POA: Insufficient documentation

## 2013-04-17 DIAGNOSIS — E785 Hyperlipidemia, unspecified: Secondary | ICD-10-CM | POA: Insufficient documentation

## 2013-04-17 DIAGNOSIS — Z8739 Personal history of other diseases of the musculoskeletal system and connective tissue: Secondary | ICD-10-CM | POA: Insufficient documentation

## 2013-04-17 NOTE — ED Provider Notes (Signed)
History    This chart was scribed for a non-physician practitioner working with [name], MD by Jiles Prows, ED scribe. This patient was seen in room TR05C/TR05C and the patient's care was started at 10:59 PM.  CSN: [id number] Arrival date & time 04/17/13  2238   No chief complaint on file.  The history is provided by the patient and medical records. No language interpreter was used.   HPI Comments: [name] [name] is a 67 y.o. female who presents to the Emergency Department complaining of her son in law  finding a capped needle.  Pt reports that a needle was found in a couch that they just got from a cousin.  Son in law reached into the couch to grab his phone and found a capped needle.  He reports that he put on rubber gloves and also found a bloody cotton ball.  Pt never touched capped needle nor was poked.  Daughter was concerned and insisted the whole family come into ED and get checked.  Pt denies headache, diaphoresis, fever, chills, nausea, vomiting, diarrhea, weakness, cough, SOB and any other pain.   Past Medical History  Diagnosis Date  . Hypertension   . Stroke 2010    x3 with residual R hemiparesis, s/p R MCA balloon angioplasty (2010)  . Peripheral vascular disease   . COPD (chronic obstructive pulmonary disease)   . Hyperlipidemia   . Osteoarthritis     h/o ruptured disc s/p ESI  . Depression   . Smoker     quit 10/2012  . History of chicken pox   . Left carotid stenosis   . Lower back pain     h/o HNP s/p surgery  . Osteoporosis 11/2010    DEXA -2.7 spine   Past Surgical History  Procedure Laterality Date  . Cholecystectomy  1970's  . Appendectomy  1960's  . Cesarean section    . Cataract extraction      bilateral  . L hip surgery  2006    fractured - screws placed  . Dexa  11/2010    T -2.7 spine, -1.9 hip   Family History  Problem Relation Age of Onset  . CAD Mother     MI  . CAD Maternal Aunt     MI  . CAD Sister     MI  . Sudden death  Father 57    died in his sleep, chain smoker  . Cirrhosis Father   . Cancer Mother     cervical  . Hypertension Daughter   . Diabetes Maternal Aunt   . Cancer Maternal Grandmother     cervical   History  Substance Use Topics  . Smoking status: Former Smoker -- 1.00 packs/day for 45 years    Types: Cigarettes  . Smokeless tobacco: Never Used  . Alcohol Use: No   OB History   Grav Para Term Preterm Abortions TAB SAB Ect Mult Living                 Review of Systems  All other systems reviewed and are negative.    Allergies  Doxycycline  Home Medications   Current Outpatient Rx  Name  Route  Sig  Dispense  Refill  . albuterol (PROVENTIL HFA;VENTOLIN HFA) 108 (90 BASE) MCG/ACT inhaler   Inhalation   Inhale 2 puffs into the lungs every 6 (six) hours as needed for wheezing or shortness of breath.          [name] aspirin  EC 81 MG tablet   Oral   Take 81 mg by mouth daily.         [name] atorvastatin (LIPITOR) 40 MG tablet   Oral   Take 1 tablet (40 mg total) by mouth daily.   90 tablet   3   . benazepril (LOTENSIN) 10 MG tablet   Oral   Take 1 tablet (10 mg total) by mouth daily.   90 tablet   2   . clopidogrel (PLAVIX) 75 MG tablet   Oral   Take 75 mg by mouth daily.         . formoterol (FORADIL) 12 MCG capsule for inhaler   Inhalation   Place 1 capsule (12 mcg total) into inhaler and inhale 2 (two) times daily.   60 capsule   12   . traZODone (DESYREL) 50 MG tablet   Oral   Take 50 mg by mouth at bedtime as needed for sleep.          BP 164/88  Pulse 90  Temp(Src) 98.3 F (36.8 C) (Oral)  Resp 14  SpO2 98% Physical Exam  Nursing note and vitals reviewed. Constitutional: She is oriented to person, place, and time. She appears well-developed and well-nourished. No distress.  HENT:  Head: Normocephalic and atraumatic.  Eyes: EOM are normal.  Neck: Neck supple. No tracheal deviation present.  Cardiovascular: Normal rate.   Pulmonary/Chest:  Effort normal. No respiratory distress.  Musculoskeletal: Normal range of motion.  Neurological: She is alert and oriented to person, place, and time.  Skin: Skin is warm and dry.  Psychiatric: She has a normal mood and affect. Her behavior is normal.    ED Course  Procedures (including critical care time) DIAGNOSTIC STUDIES: Filed Vitals:   04/17/13 2248  BP: 164/88  Pulse: 90  Temp: 98.3 F (36.8 C)  TempSrc: Oral  Resp: 14  SpO2: 98%   COORDINATION OF CARE: 11:02 PM - Discussed ED treatment with pt at bedside and pt agrees.   Labs Reviewed - No data to display No results found. 1. Exposure to needle, initial encounter     MDM  No needle poke or contact with syringe. Labs not needed.  67 y.o.[name] [name]'s evaluation in the Emergency Department is complete. It has been determined that no acute conditions requiring further emergency intervention are present at this time. The patient/guardian have been advised of the diagnosis and plan. We have discussed signs and symptoms that warrant return to the ED, such as changes or worsening in symptoms.  Vital signs are stable at discharge. Filed Vitals:   04/17/13 2248  BP: 164/88  Pulse: 90  Temp: 98.3 F (36.8 C)  Resp: 14    Patient/guardian has voiced understanding and agreed to follow-up with the PCP or specialist.  I personally performed the services described in this documentation, which was scribed in my presence. The recorded information has been reviewed and is accurate.   Dorthula Matas, PA-C 04/17/13 2308

## 2013-04-17 NOTE — ED Notes (Signed)
No changes from triage assessment 

## 2013-04-17 NOTE — ED Notes (Signed)
PT. REPORTED THAT THEY FOUND A SYRINGE NEEDLE AT THEIR COUCH THIS EVENING , PT. DENIES BEING STUCK BY NEEDLE , PT. STATED SHE JUST WANT TO BE SEEN BY AN MD .

## 2013-04-18 ENCOUNTER — Encounter: Payer: Self-pay | Admitting: Family Medicine

## 2013-04-18 NOTE — ED Provider Notes (Signed)
Medical screening examination/treatment/procedure(s) were performed by non-physician practitioner and as supervising physician I was immediately available for consultation/collaboration.   [name], MD 04/18/13 1230

## 2013-05-09 ENCOUNTER — Other Ambulatory Visit: Payer: Self-pay

## 2013-05-09 MED ORDER — CLOPIDOGREL BISULFATE 75 MG PO TABS: 75.0000 mg | ORAL_TABLET | Freq: Every day | ORAL | Status: DC

## 2013-05-09 MED ORDER — TRAZODONE HCL 50 MG PO TABS: 100.0000 mg | ORAL_TABLET | Freq: Every evening | ORAL | Status: DC | PRN

## 2013-05-09 NOTE — Telephone Encounter (Signed)
Patient notified

## 2013-05-09 NOTE — Telephone Encounter (Signed)
Pt takes Plavix 75 mg daily and needs refill to [facility] [address]; Dr Reece Agar has never filled before. Pt thought at last OV Dr Reece Agar told her to increase Trazodone 50 mg to 2 tablets at hs. Pt has been taking 2 tabs and is sleeping well. Pt request refill with change of instructions to 2 tabs at hs. (med list still has one at hs prn.)Please advise.

## 2013-05-09 NOTE — Telephone Encounter (Signed)
Will send in refill plavix and higher dose trazodone. plz ntoify pt.

## 2013-07-04 ENCOUNTER — Ambulatory Visit (INDEPENDENT_AMBULATORY_CARE_PROVIDER_SITE_OTHER): Payer: Medicare Other | Admitting: Family Medicine

## 2013-07-04 ENCOUNTER — Ambulatory Visit: Payer: Medicare Other | Admitting: Family Medicine

## 2013-07-04 ENCOUNTER — Encounter: Payer: Self-pay | Admitting: Family Medicine

## 2013-07-04 VITALS — BP 118/78 | HR 72 | Temp 98.2°F | Wt 170.8 lb

## 2013-07-04 DIAGNOSIS — I10 Essential (primary) hypertension: Secondary | ICD-10-CM

## 2013-07-04 DIAGNOSIS — E785 Hyperlipidemia, unspecified: Secondary | ICD-10-CM

## 2013-07-04 DIAGNOSIS — Z87891 Personal history of nicotine dependence: Secondary | ICD-10-CM

## 2013-07-04 DIAGNOSIS — Z23 Encounter for immunization: Secondary | ICD-10-CM

## 2013-07-04 DIAGNOSIS — M810 Age-related osteoporosis without current pathological fracture: Secondary | ICD-10-CM

## 2013-07-04 DIAGNOSIS — I639 Cerebral infarction, unspecified: Secondary | ICD-10-CM

## 2013-07-04 DIAGNOSIS — I6350 Cerebral infarction due to unspecified occlusion or stenosis of unspecified cerebral artery: Secondary | ICD-10-CM

## 2013-07-04 DIAGNOSIS — G4700 Insomnia, unspecified: Secondary | ICD-10-CM

## 2013-07-04 MED ORDER — TRAZODONE HCL 100 MG PO TABS: 100.0000 mg | ORAL_TABLET | Freq: Every evening | ORAL | Status: DC | PRN

## 2013-07-04 NOTE — Assessment & Plan Note (Signed)
Chronic, stable. Continue meds. 

## 2013-07-04 NOTE — Assessment & Plan Note (Signed)
Continue aspirin/plavix.  

## 2013-07-04 NOTE — Addendum Note (Signed)
Addended by: Josph Macho A on: 07/04/2013 12:51 PM   Modules accepted: Orders

## 2013-07-04 NOTE — Assessment & Plan Note (Signed)
Reviewed treatment options. Pt declines bisphosphonate for now. Discussed recommendation for daily calcium/vit D amount. Info provided in writing as well. Reassess bisphosphonate use at wellness exam.

## 2013-07-04 NOTE — Progress Notes (Signed)
  Subjective:    Patient ID: [name] [name], female    DOB: 11-17-1945, 67 y.o.   MRN: [id number]  HPI CC: 6 mo f/u  Insomnia - sleep initiation.  Trazodone only occaisonally helps.  Currently with runny nose - great grandchildren also sick.  COPD - doing well with albuterol as needed.  HTN - compliant with 2 meds.  No HA, vision changes, CP/tightness, SOB, leg swelling. Some leg cramping improved with OTC potassium pill. HLD - compliant with lipitor. H/o CVA - Taking aspirin and plavix. carotid stenosis - last ultrasound 12/2012.  ~50% on right  Osteoporosis - dexa 2012 -2.7 in spine.  Has never been on OP meds. Body mass index is 31.74 kg/(m^2).  Past Medical History  Diagnosis Date  . Hypertension   . Stroke 2010    x3 with residual R hemiparesis, s/p R MCA balloon angioplasty (2010)  . Peripheral vascular disease   . COPD (chronic obstructive pulmonary disease) 12/2012    spirometry: Pre: FVC 84%, FEV1 69%, ratio 0.64 consistent with moderate obstruction.  . Hyperlipidemia   . Osteoarthritis     h/o ruptured disc s/p ESI  . Depression   . Smoker     quit 10/2012  . History of chicken pox   . Carotid stenosis     R 50% (12/2012)  . Lower back pain     h/o HNP s/p surgery  . Osteoporosis 11/2010    DEXA -2.7 spine    Past Surgical History  Procedure Laterality Date  . Cholecystectomy  1970's  . Appendectomy  1960's  . Cesarean section    . Cataract extraction      bilateral  . L hip surgery  2006    fractured - screws placed  . Dexa  11/2010    T -2.7 spine, -1.9 hip    Review of Systems Per HPI    Objective:   Physical Exam  Nursing note and vitals reviewed. Constitutional: She appears well-developed and well-nourished. No distress.  HENT:  Head: Normocephalic and atraumatic.  Mouth/Throat: Oropharynx is clear and moist. No oropharyngeal exudate.  Eyes: Conjunctivae and EOM are normal. Pupils are equal, round, and reactive to light. No scleral icterus.   Cardiovascular: Normal rate, regular rhythm, normal heart sounds and intact distal pulses.   No murmur heard. Pulmonary/Chest: Effort normal and breath sounds normal. No respiratory distress. She has no wheezes. She has no rales.  Musculoskeletal: She exhibits no edema.  Skin: Skin is warm and dry. No rash noted.  Psychiatric: She has a normal mood and affect.       Assessment & Plan:

## 2013-07-04 NOTE — Assessment & Plan Note (Signed)
Continue lipitor. Will need FLP next visit to ensure LDL <100.

## 2013-07-04 NOTE — Assessment & Plan Note (Signed)
Continue trazodone 100mg  for now. Provided with handout.

## 2013-07-04 NOTE — Assessment & Plan Note (Signed)
Congratulated ,encouraged continued abstinence. 

## 2013-07-04 NOTE — Patient Instructions (Addendum)
I've sent in 100mg  dose of trazodone - take only one nightly.  Look below for strategies to help sleep at night. Flu shot today. Call your insurance about the shingles shot to see if it is covered or how much it would cost and where is cheaper (here or pharmacy).  If you want to receive here, call for nurse visit.  Try to get most or all of your calcium from your food-- 1200 mg/day for women over 50 and men over 70. To figure out dietary calcium: 300 mg/day from all non dairy foods plus 300 mg per cup of milk, other dairy, or fortified juice. Non dairy foods that contain calcium: Kale, oranges, sardines, oatmeal, soy milk/soybeans, salmon, white beans, dried figs, turnip greens, almonds, broccoli, tofu.  Increase dietary vitamin D intake.  That means more fish - mackerel, tuna, salmon, herring, sardines, catfish, cod liver oil, and eggs.  And small doses of sunshine throughout the day.  If you're not getting recommended amount in diet, I recommend starting calcium and vit D 600mg /400unit supplement one to two daily (depending on how much you get from your diet). Also start plain vitamin D 1000 units daily.  Insomnia Insomnia is frequent trouble falling and/or staying asleep. Insomnia can be a long term problem or a short term problem. Both are common. Insomnia can be a short term problem when the wakefulness is related to a certain stress or worry. Long term insomnia is often related to ongoing stress during waking hours and/or poor sleeping habits. Overtime, sleep deprivation itself can make the problem worse. Every little thing feels more severe because you are overtired and your ability to cope is decreased. CAUSES   Stress, anxiety, and depression.  Poor sleeping habits.  Distractions such as TV in the bedroom.  Naps close to bedtime.  Engaging in emotionally charged conversations before bed.  Technical reading before sleep.  Alcohol and other sedatives. They may make the problem  worse. They can hurt normal sleep patterns and normal dream activity.  Stimulants such as caffeine for several hours prior to bedtime.  Pain syndromes and shortness of breath can cause insomnia.  Exercise late at night.  Changing time zones may cause sleeping problems (jet lag). It is sometimes helpful to have someone observe your sleeping patterns. They should look for periods of not breathing during the night (sleep apnea). They should also look to see how long those periods last. If you live alone or observers are uncertain, you can also be observed at a sleep clinic where your sleep patterns will be professionally monitored. Sleep apnea requires a checkup and treatment. Give your caregivers your medical history. Give your caregivers observations your family has made about your sleep.  SYMPTOMS   Not feeling rested in the morning.  Anxiety and restlessness at bedtime.  Difficulty falling and staying asleep. TREATMENT   Your caregiver may prescribe treatment for an underlying medical disorders. Your caregiver can give advice or help if you are using alcohol or other drugs for self-medication. Treatment of underlying problems will usually eliminate insomnia problems.  Medications can be prescribed for short time use. They are generally not recommended for lengthy use.  Over-the-counter sleep medicines are not recommended for lengthy use. They can be habit forming.  You can promote easier sleeping by making lifestyle changes such as:  Using relaxation techniques that help with breathing and reduce muscle tension.  Exercising earlier in the day.  Changing your diet and the time of your last meal.  No night time snacks.  Establish a regular time to go to bed.  Counseling can help with stressful problems and worry.  Soothing music and white noise may be helpful if there are background noises you cannot remove.  Stop tedious detailed work at least one hour before bedtime. HOME CARE  INSTRUCTIONS   Keep a diary. Inform your caregiver about your progress. This includes any medication side effects. See your caregiver regularly. Take note of:  Times when you are asleep.  Times when you are awake during the night.  The quality of your sleep.  How you feel the next day. This information will help your caregiver care for you.  Get out of bed if you are still awake after 15 minutes. Read or do some quiet activity. Keep the lights down. Wait until you feel sleepy and go back to bed.  Keep regular sleeping and waking hours. Avoid naps.  Exercise regularly.  Avoid distractions at bedtime. Distractions include watching television or engaging in any intense or detailed activity like attempting to balance the household checkbook.  Develop a bedtime ritual. Keep a familiar routine of bathing, brushing your teeth, climbing into bed at the same time each night, listening to soothing music. Routines increase the success of falling to sleep faster.  Use relaxation techniques. This can be using breathing and muscle tension release routines. It can also include visualizing peaceful scenes. You can also help control troubling or intruding thoughts by keeping your mind occupied with boring or repetitive thoughts like the old concept of counting sheep. You can make it more creative like imagining planting one beautiful flower after another in your backyard garden.  During your day, work to eliminate stress. When this is not possible use some of the previous suggestions to help reduce the anxiety that accompanies stressful situations. MAKE SURE YOU:   Understand these instructions.  Will watch your condition.  Will get help right away if you are not doing well or get worse. Document Released: 09/09/2000 Document Revised: 12/05/2011 Document Reviewed: 10/10/2007 [facility] Patient Information 2014 [place], [place].

## 2013-07-24 ENCOUNTER — Other Ambulatory Visit (HOSPITAL_COMMUNITY): Payer: Self-pay | Admitting: Interventional Radiology

## 2013-07-24 ENCOUNTER — Telehealth (HOSPITAL_COMMUNITY): Payer: Self-pay | Admitting: Interventional Radiology

## 2013-07-24 DIAGNOSIS — I639 Cerebral infarction, unspecified: Secondary | ICD-10-CM

## 2013-07-24 NOTE — Telephone Encounter (Signed)
Called pt left VM for her to call to schedule 1 yr f/u appt JM

## 2013-08-02 ENCOUNTER — Other Ambulatory Visit: Payer: Self-pay | Admitting: Radiology

## 2013-08-05 ENCOUNTER — Encounter (HOSPITAL_COMMUNITY): Payer: Self-pay | Admitting: Pharmacy Technician

## 2013-08-06 ENCOUNTER — Encounter (HOSPITAL_COMMUNITY): Payer: Self-pay

## 2013-08-06 ENCOUNTER — Other Ambulatory Visit (HOSPITAL_COMMUNITY): Payer: Self-pay | Admitting: Interventional Radiology

## 2013-08-06 ENCOUNTER — Ambulatory Visit (HOSPITAL_COMMUNITY)
Admission: RE | Admit: 2013-08-06 | Discharge: 2013-08-06 | Disposition: A | Discharge status: Home / Self Care | Payer: Medicare Other | Source: Ambulatory Visit | Attending: Interventional Radiology | Admitting: Interventional Radiology

## 2013-08-06 DIAGNOSIS — I639 Cerebral infarction, unspecified: Secondary | ICD-10-CM

## 2013-08-06 DIAGNOSIS — Z8673 Personal history of transient ischemic attack (TIA), and cerebral infarction without residual deficits: Secondary | ICD-10-CM | POA: Insufficient documentation

## 2013-08-06 DIAGNOSIS — R209 Unspecified disturbances of skin sensation: Secondary | ICD-10-CM | POA: Diagnosis present

## 2013-08-06 DIAGNOSIS — I6509 Occlusion and stenosis of unspecified vertebral artery: Secondary | ICD-10-CM | POA: Diagnosis not present

## 2013-08-06 DIAGNOSIS — Z7902 Long term (current) use of antithrombotics/antiplatelets: Secondary | ICD-10-CM | POA: Insufficient documentation

## 2013-08-06 DIAGNOSIS — Z7982 Long term (current) use of aspirin: Secondary | ICD-10-CM | POA: Diagnosis not present

## 2013-08-06 DIAGNOSIS — I6529 Occlusion and stenosis of unspecified carotid artery: Secondary | ICD-10-CM | POA: Insufficient documentation

## 2013-08-06 DIAGNOSIS — I672 Cerebral atherosclerosis: Secondary | ICD-10-CM | POA: Diagnosis not present

## 2013-08-06 DIAGNOSIS — I658 Occlusion and stenosis of other precerebral arteries: Secondary | ICD-10-CM | POA: Diagnosis not present

## 2013-08-06 DIAGNOSIS — I671 Cerebral aneurysm, nonruptured: Secondary | ICD-10-CM | POA: Diagnosis not present

## 2013-08-06 LAB — CBC WITH DIFFERENTIAL/PLATELET
Basophils Absolute: 0.1 10*3/uL (ref 0.0–0.1)
Basophils Relative: 2 % — ABNORMAL HIGH (ref 0–1)
Eosinophils Absolute: 0.3 10*3/uL (ref 0.0–0.7)
Eosinophils Relative: 4 % (ref 0–5)
HCT: 40.2 % (ref 36.0–46.0)
Hemoglobin: 13.5 g/dL (ref 12.0–15.0)
Lymphocytes Relative: 31 % (ref 12–46)
Lymphs Abs: 1.9 10*3/uL (ref 0.7–4.0)
MCH: 30.8 pg (ref 26.0–34.0)
MCHC: 33.6 g/dL (ref 30.0–36.0)
MCV: 91.8 fL (ref 78.0–100.0)
Monocytes Absolute: 0.5 10*3/uL (ref 0.1–1.0)
Monocytes Relative: 8 % (ref 3–12)
Neutro Abs: 3.4 10*3/uL (ref 1.7–7.7)
Neutrophils Relative %: 55 % (ref 43–77)
Platelets: 134 10*3/uL — ABNORMAL LOW (ref 150–400)
RBC: 4.38 MIL/uL (ref 3.87–5.11)
RDW: 12.6 % (ref 11.5–15.5)
WBC: 6.1 10*3/uL (ref 4.0–10.5)

## 2013-08-06 LAB — BASIC METABOLIC PANEL
BUN: 25 mg/dL — ABNORMAL HIGH (ref 6–23)
CO2: 28 mEq/L (ref 19–32)
Calcium: 9.5 mg/dL (ref 8.4–10.5)
Chloride: 103 mEq/L (ref 96–112)
Creatinine, Ser: 1.01 mg/dL (ref 0.50–1.10)
GFR calc Af Amer: 66 mL/min — ABNORMAL LOW (ref >90)
GFR calc non Af Amer: 57 mL/min — ABNORMAL LOW (ref >90)
Glucose, Bld: 92 mg/dL (ref 70–99)
Potassium: 4.2 mEq/L (ref 3.5–5.1)
Sodium: 139 mEq/L (ref 135–145)

## 2013-08-06 LAB — APTT: aPTT: 32 seconds (ref 24–37)

## 2013-08-06 LAB — PROTIME-INR
INR: 0.95 (ref 0.00–1.49)
Prothrombin Time: 12.5 seconds (ref 11.6–15.2)

## 2013-08-06 MED ORDER — SODIUM CHLORIDE 0.9 % IV SOLN
INTRAVENOUS | Status: AC | PRN
  Administered 2013-08-06: 75 mL/h via INTRAVENOUS

## 2013-08-06 MED ORDER — MIDAZOLAM HCL 2 MG/2ML IJ SOLN
INTRAMUSCULAR | Status: AC
  Filled 2013-08-06: qty 2

## 2013-08-06 MED ORDER — HEPARIN SOD (PORK) LOCK FLUSH 100 UNIT/ML IV SOLN
INTRAVENOUS | Status: AC | PRN
  Administered 2013-08-06: 500 [IU] via INTRAVENOUS

## 2013-08-06 MED ORDER — SODIUM CHLORIDE 0.9 % IV SOLN
Freq: Once | INTRAVENOUS | Status: AC
  Administered 2013-08-06: 07:00:00 via INTRAVENOUS

## 2013-08-06 MED ORDER — SODIUM CHLORIDE 0.9 % IV SOLN: INTRAVENOUS | Status: AC

## 2013-08-06 MED ORDER — MIDAZOLAM HCL 2 MG/2ML IJ SOLN
INTRAMUSCULAR | Status: AC | PRN
  Administered 2013-08-06: 1 mg via INTRAVENOUS

## 2013-08-06 MED ORDER — FENTANYL CITRATE 0.05 MG/ML IJ SOLN
INTRAMUSCULAR | Status: AC | PRN
  Administered 2013-08-06: 25 ug via INTRAVENOUS

## 2013-08-06 MED ORDER — IOHEXOL 300 MG/ML  SOLN
150.0000 mL | Freq: Once | INTRAMUSCULAR | Status: AC | PRN
  Administered 2013-08-06: 80 mL via INTRA_ARTERIAL

## 2013-08-06 MED ORDER — FENTANYL CITRATE 0.05 MG/ML IJ SOLN
INTRAMUSCULAR | Status: AC
  Filled 2013-08-06: qty 2

## 2013-08-06 NOTE — ED Notes (Signed)
Posterior tib by doppler.  Very faint by palpation.

## 2013-08-06 NOTE — Procedures (Signed)
S/P 4 vessel cerebral arteriogram. RT CFA approach. Findings.. Distal RT CCA 50 % stenosis. RT MCA  75 % stenosis. 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 Rt VA 60 %stenosis at origin. LT CCA prox 60 % stenosis. LT CCA distally approx 70 % stenosis . LT ICA 50 % stenosis cav seg

## 2013-08-06 NOTE — H&P (Signed)
Chief Complaint: "I'm here for an angiogram" HPI: [name] [name] is an 67 y.o. female with hx of prior CVA. She is here for follow up cerebral angiogram for known carotid and MCA stenosis as well as aneurysm. She has quit smoking for the past 8-9 months. She continues to take ASA 81mg  and Plavix 75mg  daily. She denies recent illness, fevers, chills. PMHx and meds reviewed.  Past Medical History:  Past Medical History  Diagnosis Date  . Hypertension   . Stroke 2010    x3 with residual R hemiparesis, s/p R MCA balloon angioplasty (2010)  . Peripheral vascular disease   . COPD (chronic obstructive pulmonary disease) 12/2012    spirometry: Pre: FVC 84%, FEV1 69%, ratio 0.64 consistent with moderate obstruction.  . Hyperlipidemia   . Osteoarthritis     h/o ruptured disc s/p ESI  . Depression   . Smoker     quit 10/2012  . History of chicken pox   . Carotid stenosis     R 50% (12/2012)  . Lower back pain     h/o HNP s/p surgery  . Osteoporosis 11/2010    DEXA -2.7 spine    Past Surgical History:  Past Surgical History  Procedure Laterality Date  . Cholecystectomy  1970's  . Appendectomy  1960's  . Cesarean section    . Cataract extraction      bilateral  . L hip surgery  2006    fractured - screws placed  . Dexa  11/2010    T -2.7 spine, -1.9 hip    Family History:  Family History  Problem Relation Age of Onset  . CAD Mother     MI  . CAD Maternal Aunt     MI  . CAD Sister     MI  . Sudden death Father 12    died in his sleep, chain smoker  . Cirrhosis Father   . Cancer Mother     cervical  . Hypertension Daughter   . Diabetes Maternal Aunt   . Cancer Maternal Grandmother     cervical    Social History:  reports that she has quit smoking. Her smoking use included Cigarettes. She has a 45 pack-year smoking history. She has never used smokeless tobacco. She reports that she does not drink alcohol or use illicit drugs.  Allergies:  Allergies  Allergen  Reactions  . Doxycycline     Sig GI upset    Medications:   Medication List    ASK your doctor about these medications       albuterol 108 (90 BASE) MCG/ACT inhaler  Commonly known as:  PROVENTIL HFA;VENTOLIN HFA  Inhale 2 puffs into the lungs every 6 (six) hours as needed for wheezing or shortness of breath.     aspirin EC 81 MG tablet  Take 81 mg by mouth daily.     atorvastatin 40 MG tablet  Commonly known as:  LIPITOR  Take 1 tablet (40 mg total) by mouth daily.     benazepril 10 MG tablet  Commonly known as:  LOTENSIN  Take 1 tablet (10 mg total) by mouth daily.     Calcium-Vitamin D 600-400 MG-UNIT Tabs  Take 1 tablet by mouth daily.     cholecalciferol 1000 UNITS tablet  Commonly known as:  VITAMIN D  Take 1,000 Units by mouth daily.     clopidogrel 75 MG tablet  Commonly known as:  PLAVIX  Take 1 tablet (75 mg total) by mouth  daily.     traZODone 100 MG tablet  Commonly known as:  DESYREL  Take 1 tablet (100 mg total) by mouth at bedtime as needed for sleep.        Please HPI for pertinent positives, otherwise complete 10 system ROS negative.  Physical Exam: BP 145/71  Pulse 62  Temp(Src) 97.8 F (36.6 C) (Oral)  Resp 18  Ht 5' (1.524 m)  Wt 165 lb (74.844 kg)  BMI 32.22 kg/m2  SpO2 100% Body mass index is 32.22 kg/(m^2).   General Appearance:  Alert, cooperative, no distress, appears stated age  Head:  Normocephalic, without obvious abnormality, atraumatic  ENT: Unremarkable  Neck: Supple, symmetrical, trachea midline  Lungs:   Clear to auscultation bilaterally, no w/r/r.  Chest Wall:  No tenderness or deformity  Heart:  Regular rate and rhythm, S1, S2 normal, no murmur, rub or gallop.  Abdomen:   Soft, non-tender, non distended.  Extremities: Extremities normal, atraumatic, no cyanosis or edema  Pulses: 2+ and symmetric  Neurologic: Normal affect, no gross deficits.   Results for orders placed during the hospital encounter of 08/06/13  (from the past 48 hour(s))  APTT     Status: None   Collection Time    08/06/13  6:50 AM      Result Value Range   aPTT 32  24 - 37 seconds  BASIC METABOLIC PANEL     Status: Abnormal   Collection Time    08/06/13  6:50 AM      Result Value Range   Sodium 139  135 - 145 mEq/L   Potassium 4.2  3.5 - 5.1 mEq/L   Chloride 103  96 - 112 mEq/L   CO2 28  19 - 32 mEq/L   Glucose, Bld 92  70 - 99 mg/dL   BUN 25 (*) 6 - 23 mg/dL   Creatinine, Ser 1.61  0.50 - 1.10 mg/dL   Calcium 9.5  8.4 - 09.6 mg/dL   GFR calc non Af Amer 57 (*) >90 mL/min   GFR calc Af Amer 66 (*) >90 mL/min   Comment: (NOTE)     The eGFR has been calculated using the CKD EPI equation.     This calculation has not been validated in all clinical situations.     eGFR's persistently <90 mL/min signify possible Chronic Kidney     Disease.  CBC WITH DIFFERENTIAL     Status: Abnormal   Collection Time    08/06/13  6:50 AM      Result Value Range   WBC 6.1  4.0 - 10.5 K/uL   RBC 4.38  3.87 - 5.11 MIL/uL   Hemoglobin 13.5  12.0 - 15.0 g/dL   HCT 04.5  40.9 - 81.1 %   MCV 91.8  78.0 - 100.0 fL   MCH 30.8  26.0 - 34.0 pg   MCHC 33.6  30.0 - 36.0 g/dL   RDW 91.4  78.2 - 95.6 %   Platelets 134 (*) 150 - 400 K/uL   Neutrophils Relative % 55  43 - 77 %   Neutro Abs 3.4  1.7 - 7.7 K/uL   Lymphocytes Relative 31  12 - 46 %   Lymphs Abs 1.9  0.7 - 4.0 K/uL   Monocytes Relative 8  3 - 12 %   Monocytes Absolute 0.5  0.1 - 1.0 K/uL   Eosinophils Relative 4  0 - 5 %   Eosinophils Absolute 0.3  0.0 - 0.7 K/uL  Basophils Relative 2 (*) 0 - 1 %   Basophils Absolute 0.1  0.0 - 0.1 K/uL  PROTIME-INR     Status: None   Collection Time    08/06/13  6:50 AM      Result Value Range   Prothrombin Time 12.5  11.6 - 15.2 seconds   INR 0.95  0.00 - 1.49   No results found.  Assessment/Plan HX of prior CVA Known Carotid, MCA stenosis and intracranial aneurysm. For Cerebral arteriogram Discussed procedure, risks, complications,  use of sedation. Labs reviewed, ok Consent signed in chart  Brayton El PA-C 08/06/2013, 7:47 AM

## 2013-08-06 NOTE — Progress Notes (Signed)
UP AND WALKED AND TOL WELL; RIGHT GROIN STABLE; NO BLEEDING OR HEMATOMA 

## 2013-08-06 NOTE — ED Notes (Signed)
+  1 pedal pulses per Leandra Kern, RT.

## 2013-08-06 NOTE — ED Notes (Signed)
O2 d/c'd.  Exoseal closure by [facility], RT.

## 2013-08-19 ENCOUNTER — Encounter: Payer: Self-pay | Admitting: Family Medicine

## 2013-08-19 LAB — HM MAMMOGRAPHY: HM Mammogram: NEGATIVE

## 2013-08-20 ENCOUNTER — Encounter: Payer: Self-pay | Admitting: *Deleted

## 2013-09-11 ENCOUNTER — Other Ambulatory Visit: Payer: Self-pay | Admitting: Family Medicine

## 2013-10-14 ENCOUNTER — Other Ambulatory Visit: Payer: Self-pay | Admitting: *Deleted

## 2013-10-14 MED ORDER — TRAZODONE HCL 100 MG PO TABS: 100.0000 mg | ORAL_TABLET | Freq: Every evening | ORAL | Status: DC | PRN

## 2013-10-14 MED ORDER — CLOPIDOGREL BISULFATE 75 MG PO TABS: 75.0000 mg | ORAL_TABLET | Freq: Every day | ORAL | Status: DC

## 2013-10-14 MED ORDER — ATORVASTATIN CALCIUM 40 MG PO TABS: 40.0000 mg | ORAL_TABLET | Freq: Every day | ORAL | Status: DC

## 2013-10-24 ENCOUNTER — Encounter: Payer: Self-pay | Admitting: Internal Medicine

## 2013-10-24 ENCOUNTER — Ambulatory Visit (INDEPENDENT_AMBULATORY_CARE_PROVIDER_SITE_OTHER): Payer: Medicare HMO | Admitting: Internal Medicine

## 2013-10-24 ENCOUNTER — Ambulatory Visit (INDEPENDENT_AMBULATORY_CARE_PROVIDER_SITE_OTHER)
Admission: RE | Admit: 2013-10-24 | Discharge: 2013-10-24 | Disposition: A | Discharge status: Home / Self Care | Payer: Medicare HMO | Source: Ambulatory Visit | Attending: Internal Medicine | Admitting: Internal Medicine

## 2013-10-24 VITALS — BP 136/94 | HR 86 | Temp 98.4°F | Wt 168.8 lb

## 2013-10-24 DIAGNOSIS — R05 Cough: Secondary | ICD-10-CM

## 2013-10-24 DIAGNOSIS — R531 Weakness: Secondary | ICD-10-CM

## 2013-10-24 DIAGNOSIS — R059 Cough, unspecified: Secondary | ICD-10-CM

## 2013-10-24 DIAGNOSIS — R6883 Chills (without fever): Secondary | ICD-10-CM

## 2013-10-24 DIAGNOSIS — Z20828 Contact with and (suspected) exposure to other viral communicable diseases: Secondary | ICD-10-CM

## 2013-10-24 DIAGNOSIS — R5381 Other malaise: Secondary | ICD-10-CM

## 2013-10-24 DIAGNOSIS — R5383 Other fatigue: Secondary | ICD-10-CM

## 2013-10-24 LAB — POCT INFLUENZA A/B
Influenza A, POC: NEGATIVE
Influenza B, POC: NEGATIVE

## 2013-10-24 MED ORDER — OSELTAMIVIR PHOSPHATE 75 MG PO CAPS: 75.0000 mg | ORAL_CAPSULE | Freq: Two times a day (BID) | ORAL | Status: DC

## 2013-10-24 NOTE — Addendum Note (Signed)
Addended by: Roena MaladyEVONTENNO, Lamichael Youkhana Y on: 10/24/2013 12:55 PM   Modules accepted: Orders

## 2013-10-24 NOTE — Progress Notes (Signed)
HPI  Pt presents to the clinic today with c/o cough, chills and body aches. This started yesterday. She does reports that she has had a runny nose for a few days. The cough is unproductive. She is unsure if she is running fevers. She does have a history of COPD. She has been exposed to the flu-her granddaughter was diagnosed yesterday. She did get her flu shot.  Review of Systems      Past Medical History  Diagnosis Date  . Hypertension   . Stroke 2010    x3 with residual R hemiparesis, s/p R MCA balloon angioplasty (2010)  . Peripheral vascular disease   . COPD (chronic obstructive pulmonary disease) 12/2012    spirometry: Pre: FVC 84%, FEV1 69%, ratio 0.64 consistent with moderate obstruction.  . Hyperlipidemia   . Osteoarthritis     h/o ruptured disc s/p ESI  . Depression   . Smoker     quit 10/2012  . History of chicken pox   . Carotid stenosis     R 50% (12/2012)  . Lower back pain     h/o HNP s/p surgery  . Osteoporosis 11/2010    DEXA -2.7 spine    Family History  Problem Relation Age of Onset  . CAD Mother     MI  . CAD Maternal Aunt     MI  . CAD Sister     MI  . Sudden death Father [id number]    died in his sleep, chain smoker  . Cirrhosis Father   . Cancer Mother     cervical  . Hypertension Daughter   . Diabetes Maternal Aunt   . Cancer Maternal Grandmother     cervical    History   Social History  . Marital Status: Divorced    Spouse Name: N/A    Number of Children: N/A  . Years of Education: N/A   Occupational History  . Not on file.   Social History Main Topics  . Smoking status: Former Smoker -- 1.00 packs/day for 45 years    Types: Cigarettes  . Smokeless tobacco: Never Used  . Alcohol Use: No  . Drug Use: No  . Sexual Activity: Not Currently   Other Topics Concern  . Not on file   Social History Narrative   Lives with granddaughter   Occupation: retired, was CNA    Allergies  Allergen Reactions  . Doxycycline     Sig GI upset      Constitutional: Positive fatigue. Denies fever, headache or abrupt weight changes.  HEENT: Positive runny nose. Denies eye redness, eye pain, pressure behind the eyes, facial pain, nasal congestion, ear pain, ringing in the ears, wax buildup, or bloody nose. Respiratory: Positive cough. Denies difficulty breathing or shortness of breath.  Cardiovascular: Denies chest pain, chest tightness, palpitations or swelling in the hands or feet.   No other specific complaints in a complete review of systems (except as listed in HPI above).  Objective:   BP 136/94  Pulse 86  Temp(Src) 98.4 F (36.9 C) (Oral)  Wt 168 lb 12 oz (76.544 kg) Wt Readings from Last 3 Encounters:  10/24/13 168 lb 12 oz (76.544 kg)  08/06/13 165 lb (74.844 kg)  07/04/13 170 lb 12 oz (77.452 kg)     General: Appears her stated age, ill appearing in NAD. HEENT: Head: normal shape and size; Eyes: sclera white, no icterus, conjunctiva pink, PERRLA and EOMs intact; Ears: Tm's gray and intact, normal light reflex; Nose:  mucosa pink and moist, septum midline; Throat/Mouth: + PND. Teeth present, mucosa pink and moist, no exudate noted, no lesions or ulcerations noted.  Neck: Mild cervical lymphadenopathy. Neck supple, trachea midline. No massses, lumps or thyromegaly present.  Cardiovascular: Normal rate and rhythm. S1,S2 noted.  No murmur, rubs or gallops noted. No JVD or BLE edema. No carotid bruits noted. Pulmonary/Chest: Normal effort and decreased in the LUL. No respiratory distress. No wheezes, rales or ronchi noted.      Assessment & Plan:   Cough, chills, weakness:  Rapid Flu: negative Get some rest and drink plenty of water She appears ill, with diminished lung sounds, will check chest xray to r/o pneumonia Will go ahead and call in tamiflu due to exposure May need abx if chest xray positive  If any worse, go to the ER immediately  RTC as needed or if symptoms persist.

## 2013-10-24 NOTE — Patient Instructions (Signed)
Influenza Tests These are tests ordered by your caregiver to determine if you likely have an infection caused by an influenza virus. Influenza is also called "the flu". Test results may help your caregiver make rapid treatment decisions. The test also helps them determine whether or not the flu has come to your community. Influenza testing relies on detecting a virus that is shed in the respiratory secretions of the person infected. Detectable flu virus is usually only shed for the first few days that a person is ill. Therefore, testing must be done during this early time period.  The sample to be tested depends on the testing method being used and the age of the patient. The methods used are usually a:   Nasopharyngeal (NP) swab.  Nasal aspirate.  Nasal wash.  Throat swab under approved circumstances. The flu is a common respiratory illness that typically affects many people each winter season. There are two types of flu virus: A and B. Usually, a single strain of flu virus A will predominate during a particular flu season. However, there may be a mixture of A and B strains causing outbreaks in the community at the same time. The usual symptoms are:  Headache.  Exhaustion.  Fever.  Stuffy nose.  Chills.  Sore throat.  Muscle pains.  Cough. Anti-viral medications have been developed to treat either flu A alone, or both A and B. These medications, if given within 48 hours of the onset of symptoms, can reduce the severity of symptoms. The medications can also reduce the time that a patient is sick by about a day and can be helpful in limiting infection spread to other family or household members. The medications will not help if given after 48 hours of the onset of symptoms. They also do not work against infections caused by other viruses or infections caused by any bacteria.  Rapid flu tests are available and have become the most frequently used test for flu.   Depending on the method,  it may be completed in the caregiver's office in less than 30 minutes. It can also be sent to a lab, with the results available the same day.  Depending on the particular type of test used, it can identify flu A, a mixture of A and B, or differentiate between A and B.  Rapid flu testing can be used to help diagnose this infection, determine treatment options for a patient, and if negative can suggest that an alternative illness may be present. These are not perfect tests because up to 30% of flu infected persons will test negative. Test results will also occasionally be positive when the patient does not have a flu infection. Still, it is a quick way to determine whether someone is more likely than not to have flu. Another test that your caregiver may order is a viral culture.   In this test, the flu virus is actually grown and identified in the lab.  It has the advantage of identifying which viruses (A, B, or even some other virus) and which strains of flu virus are present. It is useful for documenting that flu (A and/or B) has reached a community. It is also useful for identifying outbreaks in particular populations. These populations could be nursing homes, schools, or neighborhoods. Identifying these outbreaks can assist healthcare workers in the prevention and treatment of the flu throughout a community.  Its main disadvantage is that it takes one to several days to get a result. This is less than ideal   for the caregiver who would like to prescribe treatment while the patient is still in the office. Rapid flu tests are typically used within the first 48 hours of symptoms. This helps diagnose flu and determine if anti-viral medications may be of benefit. They can also be ordered within the first week to help identify outbreaks. Viral cultures are usually used to track flu outbreaks. They also identify the particular strain that is causing them. Sometimes an unexpected flu strain will show up. Viral  cultures are also used to identify other viral infections that cause clinical symptoms similar to flu. MEANING OF TEST  Your caregiver will go over your test results with you. They will also discuss the importance of this test. In general, a positive rapid flu test means you most likely have the flu. It does not tell your caregiver how severe your symptoms are likely to be or whether or not you may experience any secondary complications from this infection.  Positive flu cultures can give your caregiver additional information about the strain infecting you. This is often not in time to benefit your recovery. It is, however, useful information for your caregivers to share with other caregivers as they treat your family, friends, and neighbors, and to share with public health agencies in your community who help establish preventive interventions in your community.  Negative flu tests may mean that you have something other than the flu. It could also mean that there is not sufficient virus in the specimen to allow it to be detected.  OBTAINING THE TEST RESULTS  Make sure you receive the test results. Ask as to how these results are to be obtained if you have not been informed. It is your responsibility to obtain your test results.  Your caregiver will provide further instructions or treatment options if necessary. Document Released: 06/22/2005 Document Revised: 12/05/2011 Document Reviewed: 06/18/2009 [employer] Patient Information 2014 [employer].  

## 2013-10-24 NOTE — Progress Notes (Signed)
Pre-visit discussion using our clinic review tool. No additional management support is needed unless otherwise documented below in the visit note.  

## 2013-10-28 ENCOUNTER — Ambulatory Visit (INDEPENDENT_AMBULATORY_CARE_PROVIDER_SITE_OTHER): Payer: Medicare HMO | Admitting: Family Medicine

## 2013-10-28 ENCOUNTER — Encounter: Payer: Self-pay | Admitting: Family Medicine

## 2013-10-28 VITALS — BP 130/80 | HR 72 | Temp 98.3°F | Wt 170.8 lb

## 2013-10-28 DIAGNOSIS — M810 Age-related osteoporosis without current pathological fracture: Secondary | ICD-10-CM

## 2013-10-28 MED ORDER — ALENDRONATE SODIUM 70 MG PO TABS: 70.0000 mg | ORAL_TABLET | ORAL | Status: DC

## 2013-10-28 MED ORDER — TRAMADOL HCL 50 MG PO TABS: 50.0000 mg | ORAL_TABLET | Freq: Two times a day (BID) | ORAL | Status: DC | PRN

## 2013-10-28 NOTE — Assessment & Plan Note (Signed)
With compression fracture of lower thoracic spine. Again reviewed options - want to avoid hormonal 2/2 h/o CVA. Again reviewed recommended cal/vit D daily. Check vit D level today. Will start fosamax today - reviewed common side effects, adverse effects, and how to administer med. A total of 20 minutes were spent face-to-face with the patient during this encounter and over half of that time was spent on counseling and coordination of care

## 2013-10-28 NOTE — Progress Notes (Signed)
Pre-visit discussion using our clinic review tool. No additional management support is needed unless otherwise documented below in the visit note.  

## 2013-10-28 NOTE — Progress Notes (Signed)
   Subjective:    Patient ID: [name] [name], female    DOB: 02-Mar-1946, 68 y.o.   MRN: [id number]  HPI CC: discuss compression fracture  Recently seen by Rene Kocheregina last week with concern for flu exposure, started on tamiflu.  Since then, cough has developed.  CXR obtained given diminished breath sounds and dyspnea, CXR clear but lower thoracic compression fracture was found (new since 2010).  Pt states she's been told had slipped disc of lumbar spine.  DEXA 2012 - osteoporosis.  Taking cal/vit D 600/400 units once daily.  Taking calcium fortified OJ once daily and some milk.  Describes L flank pain that travels to lumbar spine midline and worsens with standing.  No pain with sitting or laying down.  Worse with prolonged standing.  Takes advil for pain, which doesn't always help. Lab Results  Component Value Date   CREATININE 1.01 08/06/2013    Ex smoker - quit 10/2012 (for 1 year). H/o stroke x 3 with residual aneurysm on left. Strong fmhx CAD.  Past Medical History  Diagnosis Date  . Hypertension   . Stroke 2010    x3 with residual R hemiparesis, s/p R MCA balloon angioplasty (2010)  . Peripheral vascular disease   . COPD (chronic obstructive pulmonary disease) 12/2012    spirometry: Pre: FVC 84%, FEV1 69%, ratio 0.64 consistent with moderate obstruction.  . Hyperlipidemia   . Osteoarthritis     h/o ruptured disc s/p ESI  . Depression   . Smoker     quit 10/2012  . History of chicken pox   . Carotid stenosis     R 50% (12/2012)  . Lower back pain     h/o HNP s/p surgery  . Osteoporosis 11/2010    DEXA -2.7 spine, thoracic compression fracture    Past Surgical History  Procedure Laterality Date  . Cholecystectomy  1970's  . Appendectomy  1960's  . Cesarean section    . Cataract extraction      bilateral  . L hip surgery  2006    fractured - screws placed  . Dexa  11/2010    T -2.7 spine, -1.9 hip   Family History  Problem Relation Age of Onset  . CAD Mother     MI   . CAD Maternal Aunt     MI  . CAD Sister     MI  . Sudden death Father [id number]    died in his sleep, chain smoker  . Cirrhosis Father   . Cancer Mother     cervical  . Hypertension Daughter   . Diabetes Maternal Aunt   . Cancer Maternal Grandmother     cervical    Review of Systems Per HPI    Objective:   Physical Exam  Nursing note and vitals reviewed. Constitutional: She appears well-developed and well-nourished. No distress.  Musculoskeletal: She exhibits no edema.  Midline spine tenderness from lower thoracic to mid lumbar spine No paraspinous mm tenderness.       Assessment & Plan:

## 2013-10-28 NOTE — Patient Instructions (Addendum)
Vitamin D level today. Start fosamax for osteoporosis and history of compression fracture. Let me know how back pain is doing. Start with advil or tylenol regularly for pain control, may add tramadol if pain uncontrolled.  Back, Compression Fracture A compression fracture happens when a force is put upon the length of your spine. Slipping and falling on your bottom are examples of such a force. When this happens, sometimes the force is great enough to compress the building blocks (vertebral bodies) of your spine. Although this causes a lot of pain, this can usually be treated at home, unless your caregiver feels hospitalization is needed for pain control. Your backbone (spinal column) is made up of 24 main vertebral bodies in addition to the sacrum and coccyx (see illustration). These are held together by tough fibrous tissues (ligaments) and by support of your muscles. Nerve roots pass through the openings between the vertebrae. A sudden wrenching move, injury, or a fall may cause a compression fracture of one of the vertebral bodies. This may result in back pain or spread of pain into the belly (abdomen), the buttocks, and down the leg into the foot. Pain may also be created by muscle spasm alone. Large studies have been undertaken to determine the best possible course of action to help your back following injury and also to prevent future problems. The recommendations are as follows. FOLLOWING A COMPRESSION FRACTURE: Do the following only if advised by your caregiver.   If a back brace has been suggested or provided, wear it as directed.  DO NOT stop wearing the back brace unless instructed by your caregiver.  When allowed to return to regular activities, avoid a sedentary life style. Actively exercise. Sporadic weekend binges of tennis, racquetball, water skiing, may actually aggravate or create problems, especially if you are not in condition for that activity.  Avoid sports requiring sudden  body movements until you are in condition for them. Swimming and walking are safer activities.  Maintain good posture.  Avoid obesity.  If not already done, you should have a DEXA scan. Based on the results, be treated for osteoporosis. FOLLOWING ACUTE (SUDDEN) INJURY:  Only take over-the-counter or prescription medicines for pain, discomfort, or fever as directed by your caregiver.  Use bed rest for only the most extreme acute episode. Prolonged bed rest may aggravate your condition. Ice used for acute conditions is effective. Use a large plastic bag filled with ice. Wrap it in a towel. This also provides excellent pain relief. This may be continuous. Or use it for 30 minutes every 2 hours during acute phase, then as needed. Heat for 30 minutes prior to activities is helpful.  As soon as the acute phase (the time when your back is too painful for you to do normal activities) is over, it is important to resume normal activities and work Arboriculturisthardening programs. Back injuries can cause potentially marked changes in lifestyle. So it is important to attack these problems aggressively.  See your caregiver for continued problems. He or she can help or refer you for appropriate exercises, physical therapy and work hardening if needed.  If you are given narcotic medications for your condition, for the next 24 hours DO NOT:  Drive  Operate machinery or power tools.  Sign legal documents.  DO NOT drink alcohol, take sleeping pills or other medications that may interfere with treatment. If your caregiver has given you a follow-up appointment, it is very important to keep that appointment. Not keeping the appointment could  result in a chronic or permanent injury, pain, and disability. If there is any problem keeping the appointment, you must call back to this facility for assistance.  SEEK IMMEDIATE MEDICAL CARE IF:  You develop numbness, tingling, weakness, or problems with the use of your arms or  legs.  You develop severe back pain not relieved with medications.  You have changes in bowel or bladder control.  You have increasing pain in any areas of the body. Document Released: 09/12/2005 Document Revised: 12/05/2011 Document Reviewed: 04/16/2008 [facility] Patient Information 2014 [place], [place].

## 2013-10-29 ENCOUNTER — Encounter: Payer: Self-pay | Admitting: *Deleted

## 2013-10-29 LAB — VITAMIN D 25 HYDROXY (VIT D DEFICIENCY, FRACTURES): Vit D, 25-Hydroxy: 30 ng/mL (ref 30–89)

## 2013-11-29 ENCOUNTER — Other Ambulatory Visit: Payer: Self-pay | Admitting: Family Medicine

## 2013-11-29 NOTE — Telephone Encounter (Signed)
plz phone in. 

## 2013-11-29 NOTE — Telephone Encounter (Signed)
Rx called in as directed.   

## 2013-12-06 ENCOUNTER — Other Ambulatory Visit: Payer: Self-pay | Admitting: *Deleted

## 2013-12-06 MED ORDER — ALENDRONATE SODIUM 70 MG PO TABS: 70.0000 mg | ORAL_TABLET | ORAL | Status: DC

## 2013-12-16 ENCOUNTER — Other Ambulatory Visit: Payer: Self-pay | Admitting: Family Medicine

## 2013-12-16 NOTE — Telephone Encounter (Signed)
Pt requesting medication refill. Last ov 06/2013 with f/u appt sched for 12/2013. Pls advise

## 2013-12-24 ENCOUNTER — Other Ambulatory Visit: Payer: Self-pay | Admitting: Family Medicine

## 2013-12-24 DIAGNOSIS — I10 Essential (primary) hypertension: Secondary | ICD-10-CM

## 2013-12-24 DIAGNOSIS — E785 Hyperlipidemia, unspecified: Secondary | ICD-10-CM

## 2013-12-26 ENCOUNTER — Other Ambulatory Visit (INDEPENDENT_AMBULATORY_CARE_PROVIDER_SITE_OTHER): Payer: Medicare HMO

## 2013-12-26 DIAGNOSIS — I10 Essential (primary) hypertension: Secondary | ICD-10-CM

## 2013-12-26 DIAGNOSIS — E785 Hyperlipidemia, unspecified: Secondary | ICD-10-CM

## 2013-12-26 LAB — BASIC METABOLIC PANEL
BUN: 17 mg/dL (ref 6–23)
CO2: 28 mEq/L (ref 19–32)
Calcium: 9.2 mg/dL (ref 8.4–10.5)
Chloride: 102 mEq/L (ref 96–112)
Creatinine, Ser: 1 mg/dL (ref 0.4–1.2)
GFR: 58.73 mL/min — ABNORMAL LOW (ref >60.00)
Glucose, Bld: 98 mg/dL (ref 70–99)
Potassium: 4.5 mEq/L (ref 3.5–5.1)
Sodium: 137 mEq/L (ref 135–145)

## 2013-12-26 LAB — LIPID PANEL
Cholesterol: 205 mg/dL — ABNORMAL HIGH (ref 0–200)
HDL: 59.9 mg/dL (ref >39.00)
LDL Cholesterol: 120 mg/dL — ABNORMAL HIGH (ref 0–99)
Total CHOL/HDL Ratio: 3
Triglycerides: 124 mg/dL (ref 0.0–149.0)
VLDL: 24.8 mg/dL (ref 0.0–40.0)

## 2013-12-30 ENCOUNTER — Other Ambulatory Visit: Payer: Self-pay

## 2013-12-30 MED ORDER — BENAZEPRIL HCL 10 MG PO TABS: ORAL_TABLET | ORAL | Status: DC

## 2013-12-30 NOTE — Telephone Encounter (Signed)
Pt request refill benazepril to [facility]. Pt has cpx on 01/02/14. Advised pt done.

## 2014-01-02 ENCOUNTER — Encounter: Payer: Self-pay | Admitting: Family Medicine

## 2014-01-02 ENCOUNTER — Telehealth: Payer: Self-pay | Admitting: Family Medicine

## 2014-01-02 ENCOUNTER — Ambulatory Visit (INDEPENDENT_AMBULATORY_CARE_PROVIDER_SITE_OTHER): Payer: Medicare HMO | Admitting: Family Medicine

## 2014-01-02 ENCOUNTER — Telehealth: Payer: Self-pay | Admitting: *Deleted

## 2014-01-02 VITALS — BP 128/70 | HR 76 | Temp 98.0°F | Ht 60.5 in | Wt 171.2 lb

## 2014-01-02 DIAGNOSIS — E785 Hyperlipidemia, unspecified: Secondary | ICD-10-CM

## 2014-01-02 DIAGNOSIS — F3289 Other specified depressive episodes: Secondary | ICD-10-CM

## 2014-01-02 DIAGNOSIS — Z23 Encounter for immunization: Secondary | ICD-10-CM

## 2014-01-02 DIAGNOSIS — I69959 Hemiplegia and hemiparesis following unspecified cerebrovascular disease affecting unspecified side: Secondary | ICD-10-CM

## 2014-01-02 DIAGNOSIS — F32A Depression, unspecified: Secondary | ICD-10-CM

## 2014-01-02 DIAGNOSIS — R296 Repeated falls: Secondary | ICD-10-CM

## 2014-01-02 DIAGNOSIS — Z87891 Personal history of nicotine dependence: Secondary | ICD-10-CM

## 2014-01-02 DIAGNOSIS — M810 Age-related osteoporosis without current pathological fracture: Secondary | ICD-10-CM

## 2014-01-02 DIAGNOSIS — I69351 Hemiplegia and hemiparesis following cerebral infarction affecting right dominant side: Secondary | ICD-10-CM

## 2014-01-02 DIAGNOSIS — F329 Major depressive disorder, single episode, unspecified: Secondary | ICD-10-CM

## 2014-01-02 DIAGNOSIS — Z0000 Encounter for general adult medical examination without abnormal findings: Secondary | ICD-10-CM

## 2014-01-02 DIAGNOSIS — I10 Essential (primary) hypertension: Secondary | ICD-10-CM

## 2014-01-02 DIAGNOSIS — Z8673 Personal history of transient ischemic attack (TIA), and cerebral infarction without residual deficits: Secondary | ICD-10-CM

## 2014-01-02 DIAGNOSIS — Z9181 History of falling: Secondary | ICD-10-CM

## 2014-01-02 MED ORDER — SERTRALINE HCL 25 MG PO TABS: 25.0000 mg | ORAL_TABLET | Freq: Every day | ORAL | Status: DC

## 2014-01-02 MED ORDER — ZOSTER VACCINE LIVE 19400 UNT/0.65ML ~~LOC~~ SOLR: 0.6500 mL | Freq: Once | SUBCUTANEOUS | Status: DC

## 2014-01-02 NOTE — Assessment & Plan Note (Signed)
Chronic, stable. Continue benazepril 10mg  daily.

## 2014-01-02 NOTE — Assessment & Plan Note (Signed)
PHQ9 from 15 to 9 on latest check.  Trazodone has helped, but pt continues to experience depressed mood. ?pseudodementia with calculation questions although pt endorses difficulty since after stroke. Discussed treatment options - and will start zoloft 25mg  daily along with trazodone 100mg  nightly. Pt agrees with plan.

## 2014-01-02 NOTE — Telephone Encounter (Signed)
Relevant patient education assigned to patient using Emmi. ° °

## 2014-01-02 NOTE — Assessment & Plan Note (Signed)
I have personally reviewed the Medicare Annual Wellness questionnaire and have noted 1. The patient's medical and social history 2. Their use of alcohol, tobacco or illicit drugs 3. Their current medications and supplements 4. The patient's functional ability including ADL's, fall risks, home safety risks and hearing or visual impairment. 5. Diet and physical activity 6. Evidence for depression or mood disorders The patients weight, height, BMI have been recorded in the chart.  Hearing and vision has been addressed. I have made referrals, counseling and provided education to the patient based review of the above and I have provided the pt with a written personalized care plan for preventive services. See scanned questionairre. Advanced directives discussed: pt has not set up.  Requests new packet.  Would want daughter Alexia Freestoneatty to be HCPOA.  Reviewed preventative protocols and updated unless pt declined. prevnar today. Consider pap in 2017

## 2014-01-02 NOTE — Patient Instructions (Signed)
Start zoloft for the mood - 25mg  daily. prevnar today (2nd pneumonia shot). Pass by Marion's office to schedule appointment with physical therapy for fall prevention and balance training. Call your insurance about the shingles shot to see if it is covered or how much it would cost and where is cheaper (here or pharmacy).  If you want to receive here, call for nurse visit. Check into setting up living will.  Packet provided today. [place] - call [facility] for copy of last colonoscopy ~2010.

## 2014-01-02 NOTE — Assessment & Plan Note (Signed)
Continue aspirin/plavix.  

## 2014-01-02 NOTE — Progress Notes (Signed)
Pre visit review using our clinic review tool, if applicable. No additional management support is needed unless otherwise documented below in the visit note. 

## 2014-01-02 NOTE — Assessment & Plan Note (Signed)
Continue lipitor 40mg  daily.  Marked improvement noted in LDL but still not at goal of <100.  Consider increase to 80mg  daily at next office visit. Lab Results  Component Value Date   LDLCALC 120* 12/26/2013

## 2014-01-02 NOTE — Telephone Encounter (Signed)
Will need to discuss at next office visit.  ?[facility] vs Dr. Loreta AveMann

## 2014-01-02 NOTE — Assessment & Plan Note (Signed)
Compliant with fosamax and calcium/vit D.

## 2014-01-02 NOTE — Telephone Encounter (Signed)
Pt was seen today in office and Dr. Sharen HonesGutierrez wanted a copy of her last colonoscopy. Pt thought she went to [facility]. Called [facility] per Dr. Timoteo ExposeG's request and pt has never been seen there, and has not had a colonoscopy there.

## 2014-01-02 NOTE — Progress Notes (Addendum)
BP 128/70  Pulse 76  Temp(Src) 98 F (36.7 C) (Oral)  Ht 5' 0.5" (1.537 m)  Wt 171 lb 4 oz (77.678 kg)  BMI 32.88 kg/m2  SpO2 97%   CC: medicare wellness visit  Subjective:    Patient ID: [name] [name], female    DOB: [birth year], 68 y.o.   MRN: [id number]  HPI: [name] [name] is a 68 y.o. female presenting on 01/02/2014 for Annual Exam   Presents with daughter today. h/o mult CVAs, compliant with plavix and aspirin.  Sees Dr. Corliss Skains regularly - told has residual aneurysm.  Sees every 6 months. Quit smoking 2014 - remains abstinent.  Has quit once in past.   Hearing screen failed on left, passed on right.  Denies trouble with hearing. Vision screen failed on right.  S/p bilat cataract surgery.  No recent vision screen 2+ falls in last year. Latest last week - loses balance.  Fell outside on porch bending over to pick up charcoal.  Had trips in past as well.  Lives with granddaughter.  Denies dizziness, vertigo, presyncope.  Trouble with steps without rails. R residual hemiparesis from remote CVA. Tends to drag R foot. PHQ9 = 9. No SI/HI. Endorses depressed mood. No anhedonia. No anhedonia. Energy level down, appetite fair, sleeping fair, concentration down.  Does snore.  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 Pap smear normal 2014, actinomyces species treated with doxy course. Abnormal once - saw GYN (10+ yrs ago). Strong fmhx cervical cancer.  Mammogram normal 07/2013 Colonoscopy 2010 - normal per pt ([place])  DEXA Date: 11/2010 T -2.7 spine, -1.9 hip Osteoporosis Pneumovax - 11/2012. Flu shot - 06/2013 Tetanus - 2006  zostavax - interested - provided with script to receive shot at pharmacy but >1 mo after today's prevnar.  Advanced directives discussed - interested in setting up. Provided with  packet. Would want daughter [name] [name] to be HCPOA.    Relevant past medical, surgical, family and social history reviewed and updated as indicated.  Allergies and medications reviewed and updated. Current Outpatient Prescriptions on File Prior to Visit  Medication Sig  . albuterol (PROVENTIL HFA;VENTOLIN HFA) 108 (90 BASE) MCG/ACT inhaler Inhale 2 puffs into the lungs every 6 (six) hours as needed for wheezing or shortness of breath.   [name] alendronate (FOSAMAX) 70 MG tablet Take 1 tablet (70 mg total) by mouth once a week. Take with a full glass of water on an empty stomach.  [name] aspirin EC 81 MG tablet Take 81 mg by mouth daily.  . benazepril (LOTENSIN) 10 MG tablet TAKE ONE TABLET BY MOUTH ONCE DAILY  . Calcium Carb-Cholecalciferol (CALCIUM-VITAMIN D) 600-400 MG-UNIT TABS Take 1 tablet by mouth daily.  . cholecalciferol (VITAMIN D) 1000 UNITS tablet Take 1,000 Units by mouth daily.  . traMADol (ULTRAM) 50 MG tablet TAKE ONE TABLET BY MOUTH TWICE DAILY AS NEEDED FOR  MODERATE  PAIN  . traZODone (DESYREL) 100 MG tablet TAKE 1 TABLET AT BEDTIME AS NEEDED  FOR  SLEEP   No current facility-administered medications on file prior to visit.    Review of Systems Per HPI unless specifically indicated above    Objective:    BP 128/70  Pulse 76  Temp(Src) 98 F (36.7 C) (Oral)  Ht 5' 0.5" (1.537 m)  Wt 171 lb 4 oz (77.678 kg)  BMI 32.88 kg/m2  SpO2 97%  Physical Exam  Nursing note and vitals reviewed. Constitutional: She is oriented to person, place, and time. She appears well-developed and well-nourished. No distress.  HENT:  Head: Normocephalic and atraumatic.  Right Ear: Hearing, tympanic membrane, external ear and ear canal normal.  Left Ear: Hearing, external ear and ear canal normal.  Nose: Nose normal.  Mouth/Throat: Uvula is midline, oropharynx is clear and moist and mucous membranes are normal. No oropharyngeal exudate, posterior oropharyngeal edema or posterior oropharyngeal erythema.   Cerumen impaction on left  Eyes: Conjunctivae and EOM are normal. Pupils are equal, round, and reactive to light. No scleral icterus.  Neck: Normal range of motion. Neck supple. Carotid bruit is present (bilat). No thyromegaly present.  Cardiovascular: Normal rate, regular rhythm, normal heart sounds and intact distal pulses.   No murmur heard. Pulses:      Radial pulses are 2+ on the right side, and 2+ on the left side.  Pulmonary/Chest: Effort normal and breath sounds normal. No respiratory distress. She has no wheezes. She has no rales.  Abdominal: Soft. Bowel sounds are normal. She exhibits no distension and no mass. There is no tenderness. There is no rebound and no guarding.  Musculoskeletal: Normal range of motion. She exhibits no edema.  Lymphadenopathy:    She has no cervical adenopathy.  Neurological: She is alert and oriented to person, place, and time.  CN grossly intact, station intact slowed gait, no foot drop noted. 5-/5 strength RUE and R grip strength compared to 5/5 LUE  3/3 recall 0/5 calculation (serial 3s and WORLD - no effort). Trouble with calculation since stroke 2010 8th grade education  Skin: Skin is warm and dry. No rash noted.  Psychiatric: She has a normal mood and affect. Her behavior is normal. Judgment and thought content normal.   Results for orders placed in visit on 12/26/13  LIPID PANEL      Result Value Ref Range   Cholesterol 205 (*) 0 - 200 mg/dL   Triglycerides 846.9124.0  0.0 - 149.0 mg/dL   HDL 62.9559.90  >28.41>39.00 mg/dL   VLDL 32.424.8  0.0 - 40.140.0 mg/dL   LDL Cholesterol 027120 (*) 0 - 99 mg/dL   Total CHOL/HDL Ratio 3    BASIC METABOLIC PANEL      Result Value Ref Range   Sodium 137  135 - 145 mEq/L   Potassium 4.5  3.5 - 5.1 mEq/L   Chloride 102  96 - 112 mEq/L   CO2 28  19 - 32 mEq/L   Glucose, Bld 98  70 - 99 mg/dL   BUN 17  6 - 23 mg/dL   Creatinine, Ser 1.0  0.4 - 1.2 mg/dL   Calcium 9.2  8.4 - 25.310.5 mg/dL   GFR 66.4458.73 (*) >03.47>60.00 mL/min        Assessment & Plan:   Problem List Items Addressed This Visit   Hypertension     Chronic, stable. Continue benazepril 10mg  daily.    History of stroke     Continue aspirin/plavix.    Hyperlipidemia      Continue lipitor 40mg  daily.  Marked improvement noted in LDL but still not at goal of <100.  Consider increase to 80mg  daily at next office visit. Lab Results  Component Value Date   LDLCALC 120* 12/26/2013      Depression     PHQ9 from 15 to 9 on latest check.  Trazodone has helped, but pt continues to experience depressed mood. ?pseudodementia with  calculation questions although pt endorses difficulty since after stroke. Discussed treatment options - and will start zoloft 25mg  daily along with trazodone 100mg  nightly. Pt agrees with plan.    Relevant Medications      sertraline (ZOLOFT) tablet   Ex-smoker     Congratulated on continued abstinence.    Medicare annual wellness visit, subsequent - Primary     I have personally reviewed the Medicare Annual Wellness questionnaire and have noted 1. The patient's medical and social history 2. Their use of alcohol, tobacco or illicit drugs 3. Their current medications and supplements 4. The patient's functional ability including ADL's, fall risks, home safety risks and hearing or visual impairment. 5. Diet and physical activity 6. Evidence for depression or mood disorders The patients weight, height, BMI have been recorded in the chart.  Hearing and vision has been addressed. I have made referrals, counseling and provided education to the patient based review of the above and I have provided the pt with a written personalized care plan for preventive services. See scanned questionairre. Advanced directives discussed: pt has not set up.  Requests new packet.  Would want daughter [name] [name] to be HCPOA.  Reviewed preventative protocols and updated unless pt declined. prevnar today. Consider pap in 2017    Osteoporosis     Compliant  with fosamax and calcium/vit D.    Recurrent falls     Anticipate due to multifactorial dysequilibrium - including known R hemiparesis and possible mild instability post CVA.  Slowed gait today but no foot drop appreciated, doubt R foot brace would decrease fall risk. I did recommend referral to PT for fall prevention program and balance training and will appreciate their assistance.  ADDENDUM==> when we called to schedule PT referral, pt declined stating she didn't think she needed this.    Relevant Orders      Ambulatory referral to Physical Therapy   Hemiparesis affecting right side as late effect of stroke   Relevant Orders      Ambulatory referral to Physical Therapy    Other Visit Diagnoses   Need for vaccination with 13-polyvalent pneumococcal conjugate vaccine        Relevant Orders       Pneumococcal conjugate vaccine 13-valent (Completed)        Follow up plan: Return in about 3 months (around 04/03/2014), or as needed, for follow up mood.

## 2014-01-02 NOTE — Assessment & Plan Note (Signed)
Congratulated on continued abstinence 

## 2014-01-02 NOTE — Assessment & Plan Note (Addendum)
Anticipate due to multifactorial dysequilibrium - including known R hemiparesis and possible mild instability post CVA.  Slowed gait today but no foot drop appreciated, doubt R foot brace would decrease fall risk. I did recommend referral to PT for fall prevention program and balance training and will appreciate their assistance.  ADDENDUM==> when we called to schedule PT referral, pt declined stating she didn't think she needed this.

## 2014-01-07 ENCOUNTER — Other Ambulatory Visit: Payer: Self-pay | Admitting: Family Medicine

## 2014-02-16 ENCOUNTER — Other Ambulatory Visit: Payer: Self-pay | Admitting: Family Medicine

## 2014-02-17 NOTE — Telephone Encounter (Signed)
plz phone in. 

## 2014-02-18 NOTE — Telephone Encounter (Signed)
Rx called to pharmacy as instructed. 

## 2014-02-28 ENCOUNTER — Other Ambulatory Visit (HOSPITAL_COMMUNITY): Payer: Self-pay | Admitting: Interventional Radiology

## 2014-02-28 DIAGNOSIS — I639 Cerebral infarction, unspecified: Secondary | ICD-10-CM

## 2014-03-18 ENCOUNTER — Ambulatory Visit (HOSPITAL_COMMUNITY)
Admission: RE | Admit: 2014-03-18 | Discharge: 2014-03-18 | Disposition: A | Discharge status: Home / Self Care | Payer: Medicare HMO | Source: Ambulatory Visit | Attending: Interventional Radiology | Admitting: Interventional Radiology

## 2014-03-18 DIAGNOSIS — Z09 Encounter for follow-up examination after completed treatment for conditions other than malignant neoplasm: Secondary | ICD-10-CM | POA: Insufficient documentation

## 2014-03-18 DIAGNOSIS — Z8673 Personal history of transient ischemic attack (TIA), and cerebral infarction without residual deficits: Secondary | ICD-10-CM | POA: Insufficient documentation

## 2014-03-18 DIAGNOSIS — R5381 Other malaise: Secondary | ICD-10-CM | POA: Insufficient documentation

## 2014-03-18 DIAGNOSIS — R269 Unspecified abnormalities of gait and mobility: Secondary | ICD-10-CM | POA: Insufficient documentation

## 2014-03-18 DIAGNOSIS — F29 Unspecified psychosis not due to a substance or known physiological condition: Secondary | ICD-10-CM | POA: Insufficient documentation

## 2014-03-18 DIAGNOSIS — I639 Cerebral infarction, unspecified: Secondary | ICD-10-CM

## 2014-03-18 DIAGNOSIS — R5383 Other fatigue: Secondary | ICD-10-CM

## 2014-03-18 LAB — CREATININE, SERUM
Creatinine, Ser: 0.89 mg/dL (ref 0.50–1.10)
GFR calc Af Amer: 76 mL/min — ABNORMAL LOW (ref >90)
GFR calc non Af Amer: 66 mL/min — ABNORMAL LOW (ref >90)

## 2014-03-18 MED ORDER — GADOBENATE DIMEGLUMINE 529 MG/ML IV SOLN
15.0000 mL | Freq: Once | INTRAVENOUS | Status: AC
  Administered 2014-03-18: 15 mL via INTRAVENOUS

## 2014-03-25 ENCOUNTER — Telehealth (HOSPITAL_COMMUNITY): Payer: Self-pay | Admitting: Interventional Radiology

## 2014-03-25 NOTE — Telephone Encounter (Signed)
Called pt, told her that Deveshwar recommends a follow-up CT perfusion study in 3 month's time. He also wanted to verify that she is taking her Aspirin 81mg  1 qd and Plavix 75mg  1 qd, which she is taking. She states understanding and is in agreement with this plan. JMichaux

## 2014-03-27 ENCOUNTER — Other Ambulatory Visit: Payer: Self-pay | Admitting: Family Medicine

## 2014-04-03 ENCOUNTER — Encounter: Payer: Self-pay | Admitting: Family Medicine

## 2014-04-03 ENCOUNTER — Ambulatory Visit (INDEPENDENT_AMBULATORY_CARE_PROVIDER_SITE_OTHER): Payer: Commercial Managed Care - HMO | Admitting: Family Medicine

## 2014-04-03 VITALS — BP 130/74 | HR 77 | Temp 98.1°F | Wt 171.2 lb

## 2014-04-03 DIAGNOSIS — I10 Essential (primary) hypertension: Secondary | ICD-10-CM

## 2014-04-03 DIAGNOSIS — Z87891 Personal history of nicotine dependence: Secondary | ICD-10-CM

## 2014-04-03 DIAGNOSIS — I6523 Occlusion and stenosis of bilateral carotid arteries: Secondary | ICD-10-CM

## 2014-04-03 DIAGNOSIS — F3289 Other specified depressive episodes: Secondary | ICD-10-CM

## 2014-04-03 DIAGNOSIS — Z9181 History of falling: Secondary | ICD-10-CM

## 2014-04-03 DIAGNOSIS — I720 Aneurysm of carotid artery: Secondary | ICD-10-CM | POA: Insufficient documentation

## 2014-04-03 DIAGNOSIS — F329 Major depressive disorder, single episode, unspecified: Secondary | ICD-10-CM

## 2014-04-03 DIAGNOSIS — F32A Depression, unspecified: Secondary | ICD-10-CM

## 2014-04-03 DIAGNOSIS — I6529 Occlusion and stenosis of unspecified carotid artery: Secondary | ICD-10-CM | POA: Insufficient documentation

## 2014-04-03 DIAGNOSIS — R296 Repeated falls: Secondary | ICD-10-CM

## 2014-04-03 DIAGNOSIS — I658 Occlusion and stenosis of other precerebral arteries: Secondary | ICD-10-CM

## 2014-04-03 MED ORDER — SERTRALINE HCL 50 MG PO TABS: 50.0000 mg | ORAL_TABLET | Freq: Every day | ORAL | Status: DC

## 2014-04-03 NOTE — Assessment & Plan Note (Signed)
Chronic, stable 

## 2014-04-03 NOTE — Assessment & Plan Note (Signed)
Feels sertraline somewhat helpful - will increase to 50mg  daily. Pt agrees with plan.

## 2014-04-03 NOTE — Assessment & Plan Note (Signed)
Encouraged continued abstinence. 

## 2014-04-03 NOTE — Assessment & Plan Note (Signed)
Followed by IR.  

## 2014-04-03 NOTE — Patient Instructions (Addendum)
I encourage you to regularly use your cane to help prevent falls. Let's increase sertraline to 2 pills until you run out then new dose will be 50mg  daily (at the pharmacy). [facility] to ask who did your colonoscopy then we can request records from that doctor Good to see you today, call us with questions. Return to see me in 4-5 months for follow up , sooner if needed.

## 2014-04-03 NOTE — Progress Notes (Signed)
Pre visit review using our clinic review tool, if applicable. No additional management support is needed unless otherwise documented below in the visit note. 

## 2014-04-03 NOTE — Progress Notes (Signed)
BP 130/74  Pulse 77  Temp(Src) 98.1 F (36.7 C) (Oral)  Wt 171 lb 4 oz (77.678 kg)   CC: 3 mo f/u  Subjective:    Patient ID: [name] [name], female    DOB: 12-Dec-1945, 68 y.o.   MRN: [id number]  HPI: [name] McgregorHattie S Gadsden is a 68 y.o. female presenting on 04/03/2014 for Follow-up   Last visit found to have depression - possibly contributing to pseudodementia although pat also endorsed memory troubles since strokes. Zoloft 25mg  daily was started, trazodone 100mg  nightly was continued. Thinks it may have helped - better able to deal with stressors, less irritable.  Fall risk - remains unsteady with gait. I recommended PT referral but when we called to schedule pt declined. Pt continues to decline. Denies recent falls. Doesn't use ambulatory assistive device "I just don't".   H/o mult CVAs, compliant with plavix and aspirin. Sees Dr. Corliss Skainseveshwar regularly - told has residual aneurysm. Sees every 6 months. R residual hemiparesis from remote CVA. Tends to drag R foot.  Quit smoking 2014 - remains abstinent. Has quit once in past.   Colonoscopy - unsure where, but no records at [facility].  Has not received shingles shot yet.  MRI head MRA brain: IMPRESSION:  1. No evidence of acute intracranial abnormality. Remote bilateral parietal infarcts.  2. Intracranial atherosclerosis with moderate stenosis of the left cavernous carotid and proximal right MCA, likely not significantly changed.  3. Unchanged 5 mm aneurysm at the right petrous-cavernous carotid junction. Planned f/u in 3 months (Dr. Corliss Skainseveshwar)  Relevant past medical, surgical, family and social history reviewed and updated as indicated.  Allergies and medications reviewed and updated. Current Outpatient Prescriptions on File Prior to Visit  Medication Sig  . albuterol (PROVENTIL HFA;VENTOLIN HFA) 108 (90 BASE) MCG/ACT inhaler Inhale 2 puffs into the lungs every 6 (six) hours as needed for wheezing or shortness of breath.   [name].  alendronate (FOSAMAX) 70 MG tablet Take 1 tablet (70 mg total) by mouth once a week. Take with a full glass of water on an empty stomach.  [name]. aspirin EC 81 MG tablet Take 81 mg by mouth daily.  [name]. atorvastatin (LIPITOR) 40 MG tablet Take 1 tablet (40 mg total) by mouth daily.  . benazepril (LOTENSIN) 10 MG tablet Take 1 tablet (10 mg total) by mouth daily.  . Calcium Carb-Cholecalciferol (CALCIUM-VITAMIN D) 600-400 MG-UNIT TABS Take 1 tablet by mouth daily.  . cholecalciferol (VITAMIN D) 1000 UNITS tablet Take 1,000 Units by mouth daily.  . clopidogrel (PLAVIX) 75 MG tablet Take 1 tablet (75 mg total) by mouth daily.  . traMADol (ULTRAM) 50 MG tablet TAKE ONE TABLET BY MOUTH TWICE DAILY AS NEEDED FOR  MODERATE  PAIN  . traZODone (DESYREL) 100 MG tablet TAKE 1 TABLET AT BEDTIME AS NEEDED  FOR  SLEEP  . zoster vaccine live, PF, (ZOSTAVAX) [id number] UNT/0.65ML injection Inject 19,400 Units into the skin once.   No current facility-administered medications on file prior to visit.    Review of Systems Per HPI unless specifically indicated above    Objective:    BP 130/74  Pulse 77  Temp(Src) 98.1 F (36.7 C) (Oral)  Wt 171 lb 4 oz (77.678 kg)  Physical Exam  Nursing note and vitals reviewed. Constitutional: She appears well-developed and well-nourished. No distress.  HENT:  Mouth/Throat: Oropharynx is clear and moist. No oropharyngeal exudate.  Neck: Carotid bruit is present (bilateral). No thyromegaly present.  Cardiovascular: Normal rate, regular rhythm, normal  heart sounds and intact distal pulses.   No murmur heard. Pulmonary/Chest: Effort normal and breath sounds normal. No respiratory distress. She has no wheezes. She has no rales.  Musculoskeletal: She exhibits no edema.   Results for orders placed in visit on 04/03/14  HM COLONOSCOPY      Result Value Ref Range   [facility] Colonoscopy per pt recollection normal        Assessment & Plan:   Problem List Items Addressed This Visit    Recurrent falls     Declined PT for fall prevention and balance training. Multifactorial - known R residual hemiparesis. Encouraged regular cane use.    Hypertension     Chronic, stable.    Ex-smoker     Encouraged continued abstinence    Depression - Primary     Feels sertraline somewhat helpful - will increase to 50mg  daily. Pt agrees with plan.    Relevant Medications      sertraline (ZOLOFT) tablet   Carotid stenosis   Carotid artery aneurysm     Followed by IR.        Follow up plan: Return in about 5 months (around 09/03/2014), or if symptoms worsen or fail to improve, for follow up visit.

## 2014-04-03 NOTE — Assessment & Plan Note (Signed)
Declined PT for fall prevention and balance training. Multifactorial - known R residual hemiparesis. Encouraged regular cane use.

## 2014-04-15 ENCOUNTER — Other Ambulatory Visit: Payer: Self-pay | Admitting: Family Medicine

## 2014-07-08 ENCOUNTER — Other Ambulatory Visit: Payer: Self-pay | Admitting: Family Medicine

## 2014-07-08 NOTE — Telephone Encounter (Signed)
Ok to refill 

## 2014-07-09 NOTE — Telephone Encounter (Signed)
Rx called in as directed.   

## 2014-07-09 NOTE — Telephone Encounter (Signed)
plz phone in. 

## 2014-08-08 ENCOUNTER — Other Ambulatory Visit: Payer: Self-pay | Admitting: Family Medicine

## 2014-08-13 ENCOUNTER — Other Ambulatory Visit: Payer: Self-pay | Admitting: Family Medicine

## 2014-09-13 ENCOUNTER — Other Ambulatory Visit: Payer: Self-pay | Admitting: Family Medicine

## 2014-09-14 NOTE — Telephone Encounter (Signed)
plz phone in. 

## 2014-09-15 NOTE — Telephone Encounter (Signed)
Rx called in as directed.   

## 2014-09-28 ENCOUNTER — Other Ambulatory Visit: Payer: Self-pay | Admitting: Family Medicine

## 2014-10-02 ENCOUNTER — Other Ambulatory Visit: Payer: Self-pay | Admitting: Family Medicine

## 2014-10-02 NOTE — Telephone Encounter (Signed)
Ok to refill 

## 2014-11-17 ENCOUNTER — Other Ambulatory Visit: Payer: Self-pay | Admitting: Family Medicine

## 2014-12-09 DIAGNOSIS — Z1231 Encounter for screening mammogram for malignant neoplasm of breast: Secondary | ICD-10-CM | POA: Diagnosis not present

## 2014-12-10 ENCOUNTER — Encounter: Payer: Self-pay | Admitting: Family Medicine

## 2014-12-10 LAB — HM MAMMOGRAPHY: HM Mammogram: NORMAL

## 2014-12-11 ENCOUNTER — Encounter: Payer: Self-pay | Admitting: *Deleted

## 2014-12-12 ENCOUNTER — Other Ambulatory Visit: Payer: Self-pay | Admitting: Family Medicine

## 2014-12-29 ENCOUNTER — Encounter (HOSPITAL_COMMUNITY): Payer: Self-pay | Admitting: Emergency Medicine

## 2014-12-29 ENCOUNTER — Emergency Department (HOSPITAL_COMMUNITY)
Admission: EM | Admit: 2014-12-29 | Discharge: 2014-12-30 | Disposition: A | Discharge status: Home / Self Care | Payer: Commercial Managed Care - HMO | Attending: Emergency Medicine | Admitting: Emergency Medicine

## 2014-12-29 DIAGNOSIS — N3090 Cystitis, unspecified without hematuria: Secondary | ICD-10-CM | POA: Insufficient documentation

## 2014-12-29 DIAGNOSIS — Z7901 Long term (current) use of anticoagulants: Secondary | ICD-10-CM | POA: Diagnosis not present

## 2014-12-29 DIAGNOSIS — R519 Headache, unspecified: Secondary | ICD-10-CM

## 2014-12-29 DIAGNOSIS — Z8719 Personal history of other diseases of the digestive system: Secondary | ICD-10-CM | POA: Diagnosis not present

## 2014-12-29 DIAGNOSIS — Z79899 Other long term (current) drug therapy: Secondary | ICD-10-CM | POA: Insufficient documentation

## 2014-12-29 DIAGNOSIS — J449 Chronic obstructive pulmonary disease, unspecified: Secondary | ICD-10-CM | POA: Insufficient documentation

## 2014-12-29 DIAGNOSIS — M1990 Unspecified osteoarthritis, unspecified site: Secondary | ICD-10-CM | POA: Insufficient documentation

## 2014-12-29 DIAGNOSIS — R51 Headache: Secondary | ICD-10-CM | POA: Insufficient documentation

## 2014-12-29 DIAGNOSIS — I739 Peripheral vascular disease, unspecified: Secondary | ICD-10-CM | POA: Diagnosis not present

## 2014-12-29 DIAGNOSIS — Z9889 Other specified postprocedural states: Secondary | ICD-10-CM | POA: Insufficient documentation

## 2014-12-29 DIAGNOSIS — F329 Major depressive disorder, single episode, unspecified: Secondary | ICD-10-CM | POA: Diagnosis not present

## 2014-12-29 DIAGNOSIS — N3000 Acute cystitis without hematuria: Secondary | ICD-10-CM | POA: Diagnosis not present

## 2014-12-29 DIAGNOSIS — R531 Weakness: Secondary | ICD-10-CM | POA: Diagnosis not present

## 2014-12-29 DIAGNOSIS — E785 Hyperlipidemia, unspecified: Secondary | ICD-10-CM | POA: Insufficient documentation

## 2014-12-29 DIAGNOSIS — Z9049 Acquired absence of other specified parts of digestive tract: Secondary | ICD-10-CM | POA: Insufficient documentation

## 2014-12-29 DIAGNOSIS — Z87891 Personal history of nicotine dependence: Secondary | ICD-10-CM | POA: Diagnosis not present

## 2014-12-29 DIAGNOSIS — I10 Essential (primary) hypertension: Secondary | ICD-10-CM | POA: Diagnosis not present

## 2014-12-29 DIAGNOSIS — Z792 Long term (current) use of antibiotics: Secondary | ICD-10-CM | POA: Insufficient documentation

## 2014-12-29 DIAGNOSIS — Z8673 Personal history of transient ischemic attack (TIA), and cerebral infarction without residual deficits: Secondary | ICD-10-CM | POA: Diagnosis not present

## 2014-12-29 DIAGNOSIS — M810 Age-related osteoporosis without current pathological fracture: Secondary | ICD-10-CM | POA: Insufficient documentation

## 2014-12-29 DIAGNOSIS — Z7982 Long term (current) use of aspirin: Secondary | ICD-10-CM | POA: Insufficient documentation

## 2014-12-29 HISTORY — DX: Aneurysm of unspecified site: I72.9

## 2014-12-29 LAB — I-STAT CHEM 8, ED
BUN: 31 mg/dL — ABNORMAL HIGH (ref 6–23)
Calcium, Ion: 1.12 mmol/L — ABNORMAL LOW (ref 1.13–1.30)
Chloride: 105 mmol/L (ref 96–112)
Creatinine, Ser: 1.1 mg/dL (ref 0.50–1.10)
Glucose, Bld: 102 mg/dL — ABNORMAL HIGH (ref 70–99)
HCT: 43 % (ref 36.0–46.0)
Hemoglobin: 14.6 g/dL (ref 12.0–15.0)
Potassium: 4.6 mmol/L (ref 3.5–5.1)
Sodium: 139 mmol/L (ref 135–145)
TCO2: 22 mmol/L (ref 0–100)

## 2014-12-29 LAB — CBC WITH DIFFERENTIAL/PLATELET
Basophils Absolute: 0.1 10*3/uL (ref 0.0–0.1)
Basophils Relative: 1 % (ref 0–1)
Eosinophils Absolute: 0.1 10*3/uL (ref 0.0–0.7)
Eosinophils Relative: 2 % (ref 0–5)
HCT: 40.8 % (ref 36.0–46.0)
Hemoglobin: 13.2 g/dL (ref 12.0–15.0)
Lymphocytes Relative: 27 % (ref 12–46)
Lymphs Abs: 2 10*3/uL (ref 0.7–4.0)
MCH: 29.9 pg (ref 26.0–34.0)
MCHC: 32.4 g/dL (ref 30.0–36.0)
MCV: 92.3 fL (ref 78.0–100.0)
Monocytes Absolute: 0.5 10*3/uL (ref 0.1–1.0)
Monocytes Relative: 7 % (ref 3–12)
Neutro Abs: 4.6 10*3/uL (ref 1.7–7.7)
Neutrophils Relative %: 63 % (ref 43–77)
Platelets: 154 10*3/uL (ref 150–400)
RBC: 4.42 MIL/uL (ref 3.87–5.11)
RDW: 12.9 % (ref 11.5–15.5)
WBC: 7.3 10*3/uL (ref 4.0–10.5)

## 2014-12-29 LAB — URINALYSIS, ROUTINE W REFLEX MICROSCOPIC
Bilirubin Urine: NEGATIVE
Glucose, UA: NEGATIVE mg/dL
Hgb urine dipstick: NEGATIVE
Ketones, ur: NEGATIVE mg/dL
Nitrite: POSITIVE — AB
Protein, ur: NEGATIVE mg/dL
Specific Gravity, Urine: 1.022 (ref 1.005–1.030)
Urobilinogen, UA: 1 mg/dL (ref 0.0–1.0)
pH: 6 (ref 5.0–8.0)

## 2014-12-29 LAB — URINE MICROSCOPIC-ADD ON

## 2014-12-29 MED ORDER — SODIUM CHLORIDE 0.9 % IV BOLUS (SEPSIS)
1000.0000 mL | Freq: Once | INTRAVENOUS | Status: AC
  Administered 2014-12-29: 1000 mL via INTRAVENOUS

## 2014-12-29 MED ORDER — METOCLOPRAMIDE HCL 5 MG/ML IJ SOLN
5.0000 mg | Freq: Once | INTRAMUSCULAR | Status: AC
  Administered 2014-12-29: 5 mg via INTRAVENOUS
  Filled 2014-12-29: qty 2

## 2014-12-29 MED ORDER — DEXTROSE 5 % IV SOLN
1.0000 g | Freq: Once | INTRAVENOUS | Status: AC
  Administered 2014-12-29: 1 g via INTRAVENOUS
  Filled 2014-12-29: qty 10

## 2014-12-29 MED ORDER — ACETAMINOPHEN 500 MG PO TABS
1000.0000 mg | ORAL_TABLET | Freq: Once | ORAL | Status: AC
  Administered 2014-12-29: 1000 mg via ORAL
  Filled 2014-12-29: qty 2

## 2014-12-29 NOTE — ED Notes (Signed)
Dorna LeitzAlex Nickle, MD at bedside

## 2014-12-29 NOTE — ED Notes (Signed)
Pt has hx of brain aneurysm.  St's for past week she has been unsteady on her feet.  Also c/o pressure type pain in her head.  Was told by her MD to come to ED if she had these type symptoms

## 2014-12-29 NOTE — ED Provider Notes (Signed)
CSN: [id number]     Arrival date & time 12/29/14  1755 History   First MD Initiated Contact with Patient 12/29/14 2132     Chief Complaint  Patient presents with  . Headache     (Consider location/radiation/quality/duration/timing/severity/associated sxs/prior Treatment) Patient is a 69 y.o. female presenting with weakness. The history is provided by the patient.  Weakness This is a new problem. The current episode started 1 to 4 weeks ago. The problem occurs constantly. The problem has been gradually worsening. Associated symptoms include headaches and weakness. Pertinent negatives include no fever, nausea or visual change. Nothing aggravates the symptoms. She has tried nothing for the symptoms. The treatment provided no relief.    Past Medical History  Diagnosis Date  . Hypertension   . Stroke 2010    x3 with residual R hemiparesis, s/p R MCA balloon angioplasty (2010)  . Peripheral vascular disease   . COPD (chronic obstructive pulmonary disease) 12/2012    spirometry: Pre: FVC 84%, FEV1 69%, ratio 0.64 consistent with moderate obstruction.  . Hyperlipidemia   . Osteoarthritis     h/o ruptured disc s/p ESI  . Depression   . Smoker     quit 10/2012  . History of chicken pox   . Carotid stenosis     R 50% (12/2012)  . Lower back pain     h/o HNP s/p surgery  . Osteoporosis 11/2010    DEXA -2.7 spine, thoracic compression fracture  . Aneurysm    Past Surgical History  Procedure Laterality Date  . Cholecystectomy  1970's  . Appendectomy  1960's  . Cesarean section    . Cataract extraction      bilateral  . L hip surgery  2006    fractured - screws placed  . Dexa  11/2010    T -2.7 spine, -1.9 hip   Family History  Problem Relation Age of Onset  . CAD Mother     MI  . CAD Maternal Aunt     MI  . CAD Sister     MI  . Sudden death Father 4    died in his sleep, chain smoker  . Cirrhosis Father   . Cancer Mother     cervical  . Hypertension Daughter   . Diabetes  Maternal Aunt   . Cancer Maternal Grandmother     cervical   History  Substance Use Topics  . Smoking status: Former Smoker -- 1.00 packs/day for 45 years    Types: Cigarettes  . Smokeless tobacco: Never Used  . Alcohol Use: No   OB History    No data available     Review of Systems  Constitutional: Negative for fever.  Gastrointestinal: Negative for nausea.  Neurological: Positive for weakness and headaches.  All other systems reviewed and are negative.     Allergies  Doxycycline  Home Medications   Prior to Admission medications   Medication Sig Start Date End Date Taking? Authorizing Provider  alendronate (FOSAMAX) 70 MG tablet TAKE 1 TABLET WEEKLY. TAKE WITH A FULL GLASS OF WATER ON AN EMPTY STOMACH. 12/12/14  Yes [name], MD  aspirin EC 81 MG tablet Take 81 mg by mouth daily.   Yes Historical Provider, MD  atorvastatin (LIPITOR) 40 MG tablet TAKE 1 TABLET EVERY DAY 08/14/14  Yes [name], MD  benazepril (LOTENSIN) 10 MG tablet TAKE ONE TABLET BY MOUTH ONCE DAILY 09/29/14  Yes [name], MD  Calcium Carb-Cholecalciferol (CALCIUM-VITAMIN D) 600-400 MG-UNIT TABS Take  1 tablet by mouth daily.   Yes Historical Provider, MD  clopidogrel (PLAVIX) 75 MG tablet TAKE 1 TABLET EVERY DAY 08/14/14  Yes Eustaquio BoydenJavier Gutierrez, MD  sertraline (ZOLOFT) 50 MG tablet TAKE ONE TABLET BY MOUTH ONCE DAILY 11/17/14  Yes Eustaquio BoydenJavier Gutierrez, MD  traMADol (ULTRAM) 50 MG tablet TAKE ONE TABLET BY MOUTH TWICE DAILY AS NEEDED 09/14/14  Yes Eustaquio BoydenJavier Gutierrez, MD  traZODone (DESYREL) 100 MG tablet TAKE 1 TABLET AT BEDTIME AS NEEDED FOR SLEEP 10/03/14  Yes Eustaquio BoydenJavier Gutierrez, MD  albuterol (PROVENTIL HFA;VENTOLIN HFA) 108 (90 BASE) MCG/ACT inhaler Inhale 2 puffs into the lungs every 6 (six) hours as needed for wheezing or shortness of breath.     Historical Provider, MD  cephALEXin (KEFLEX) 500 MG capsule Take 1 capsule (500 mg total) by mouth 2 (two) times daily. 12/30/14 01/05/15  Dorna LeitzAlex Capricia Serda,  MD  zoster vaccine live, PF, (ZOSTAVAX) [id number] UNT/0.65ML injection Inject 19,400 Units into the skin once. 01/02/14   Eustaquio BoydenJavier Gutierrez, MD   BP 107/49 mmHg  Pulse 73  Temp(Src) 97.9 F (36.6 C)  Resp 17  Ht 5' (1.524 m)  Wt 170 lb (77.111 kg)  BMI 33.20 kg/m2  SpO2 95% Physical Exam  Constitutional: She is oriented to person, place, and time. She appears well-developed and well-nourished. No distress.  HENT:  Head: Normocephalic and atraumatic.  Mouth/Throat: No oropharyngeal exudate.  Mucous membranes dry  Eyes: Conjunctivae are normal. Pupils are equal, round, and reactive to light.  Cardiovascular: Normal rate, regular rhythm and normal heart sounds.  Exam reveals no gallop and no friction rub.   No murmur heard. Pulmonary/Chest: Effort normal. No respiratory distress. She has no wheezes.  Abdominal: Soft. She exhibits no distension. There is no tenderness.  Musculoskeletal: Normal range of motion. She exhibits no edema.  Neurological: She is alert and oriented to person, place, and time. She has normal strength. No cranial nerve deficit or sensory deficit. Coordination normal.  5/5 strength in BUE and BLE and symmetric. Mild generalized weakness without focality. No dysmetria on finger-nose-finger.  Skin: She is not diaphoretic.  Nursing note and vitals reviewed.   ED Course  Procedures (including critical care time) Labs Review Labs Reviewed  URINALYSIS, ROUTINE W REFLEX MICROSCOPIC - Abnormal; Notable for the following:    APPearance CLOUDY (*)    Nitrite POSITIVE (*)    Leukocytes, UA LARGE (*)    All other components within normal limits  URINE MICROSCOPIC-ADD ON - Abnormal; Notable for the following:    Bacteria, UA MANY (*)    All other components within normal limits  I-STAT CHEM 8, ED - Abnormal; Notable for the following:    BUN 31 (*)    Glucose, Bld 102 (*)    Calcium, Ion 1.12 (*)    All other components within normal limits  URINE CULTURE  CBC WITH  DIFFERENTIAL/PLATELET    Imaging Review No results found.   EKG Interpretation None      MDM   Final diagnoses:  Acute cystitis without hematuria  Acute nonintractable headache, unspecified headache type    69 year old female with history of  5 mm aneurysm as well as 2 previous strokes with residual right-sided deficits and balance issues presents with generalized weakness, headache. Symptoms worsening over the last couple of weeks with a weakness but headache present just today.  Afebrile and vital signs stable. Blood pressure 110s systolic.  She does appear slightly dehydrated with dry mucous membranes on exam. Otherwise, nonfocal neurologic exam. Able  to ambulate with assistance. Given history of aneurysm with headache with pressure as well as weakness, CTA ordered for evaluation. Laboratory workup including urinalysis also sent. Fluid hydration.   Urinalysis returns with UTI. Laboratory workup reassuring with normal renal function. CTA pending. Headache is improved after Reglan and Tylenol and the patient is requesting to eat. Rocephin given for UTI. Headache is likely benign and possibly related to UTI.  If CTA is negative and the patient is tolerating by mouth (which she is eating a sandwich on my repeat exam) and remains at baseline and she has now, she should be stable for outpatient treatment.  Dorna Leitz, MD 12/30/14 1610  Tilden Fossa, MD 12/30/14 [phone]

## 2014-12-30 ENCOUNTER — Emergency Department (HOSPITAL_COMMUNITY): Payer: Commercial Managed Care - HMO

## 2014-12-30 ENCOUNTER — Encounter (HOSPITAL_COMMUNITY): Payer: Self-pay | Admitting: Radiology

## 2014-12-30 DIAGNOSIS — J449 Chronic obstructive pulmonary disease, unspecified: Secondary | ICD-10-CM | POA: Diagnosis not present

## 2014-12-30 DIAGNOSIS — I10 Essential (primary) hypertension: Secondary | ICD-10-CM | POA: Diagnosis not present

## 2014-12-30 DIAGNOSIS — I6601 Occlusion and stenosis of right middle cerebral artery: Secondary | ICD-10-CM | POA: Diagnosis not present

## 2014-12-30 DIAGNOSIS — M1990 Unspecified osteoarthritis, unspecified site: Secondary | ICD-10-CM | POA: Diagnosis not present

## 2014-12-30 DIAGNOSIS — R531 Weakness: Secondary | ICD-10-CM | POA: Diagnosis not present

## 2014-12-30 DIAGNOSIS — N3090 Cystitis, unspecified without hematuria: Secondary | ICD-10-CM | POA: Diagnosis not present

## 2014-12-30 DIAGNOSIS — E785 Hyperlipidemia, unspecified: Secondary | ICD-10-CM | POA: Diagnosis not present

## 2014-12-30 DIAGNOSIS — I739 Peripheral vascular disease, unspecified: Secondary | ICD-10-CM | POA: Diagnosis not present

## 2014-12-30 DIAGNOSIS — M810 Age-related osteoporosis without current pathological fracture: Secondary | ICD-10-CM | POA: Diagnosis not present

## 2014-12-30 DIAGNOSIS — R51 Headache: Secondary | ICD-10-CM | POA: Diagnosis not present

## 2014-12-30 MED ORDER — SODIUM CHLORIDE 0.9 % IV BOLUS (SEPSIS)
1000.0000 mL | Freq: Once | INTRAVENOUS | Status: AC
  Administered 2014-12-30: 1000 mL via INTRAVENOUS

## 2014-12-30 MED ORDER — IOHEXOL 350 MG/ML SOLN
80.0000 mL | Freq: Once | INTRAVENOUS | Status: AC | PRN
  Administered 2014-12-30: 80 mL via INTRAVENOUS

## 2014-12-30 MED ORDER — CEPHALEXIN 500 MG PO CAPS: 500.0000 mg | ORAL_CAPSULE | Freq: Two times a day (BID) | ORAL | Status: AC

## 2014-12-30 NOTE — Discharge Instructions (Signed)

## 2014-12-30 NOTE — ED Notes (Signed)
Patient returned from CT

## 2014-12-30 NOTE — ED Notes (Signed)
Patient transported to CT 

## 2014-12-30 NOTE — ED Provider Notes (Signed)
Patient initially seen and evaluated by Dr. Durward FortesNickle/ Dr. Madilyn Hookees with headache and incidental finding of urinary tract infection. Headache has improved. She has a history of cerebral aneurysm so she was sent for CT angiogram which she shows no evidence of aneurysm. She is discharged. Prescription had already been written for cephalexin and she is to follow-up with her PCP to ensure clearing of her urinary tract infection.  [name] Boozeavid Perel Hauschild, MD 12/30/14 [id number]

## 2014-12-31 ENCOUNTER — Other Ambulatory Visit: Payer: Self-pay | Admitting: Family Medicine

## 2015-01-01 LAB — URINE CULTURE: Colony Count: >=100000

## 2015-01-02 ENCOUNTER — Telehealth (HOSPITAL_BASED_OUTPATIENT_CLINIC_OR_DEPARTMENT_OTHER): Payer: Self-pay | Admitting: Emergency Medicine

## 2015-01-02 ENCOUNTER — Encounter: Payer: Self-pay | Admitting: Family Medicine

## 2015-01-02 ENCOUNTER — Ambulatory Visit (INDEPENDENT_AMBULATORY_CARE_PROVIDER_SITE_OTHER): Payer: Commercial Managed Care - HMO | Admitting: Family Medicine

## 2015-01-02 VITALS — BP 118/68 | HR 84 | Temp 98.4°F | Wt 172.8 lb

## 2015-01-02 DIAGNOSIS — I69851 Hemiplegia and hemiparesis following other cerebrovascular disease affecting right dominant side: Secondary | ICD-10-CM

## 2015-01-02 DIAGNOSIS — R51 Headache: Secondary | ICD-10-CM

## 2015-01-02 DIAGNOSIS — F32A Depression, unspecified: Secondary | ICD-10-CM

## 2015-01-02 DIAGNOSIS — R519 Headache, unspecified: Secondary | ICD-10-CM

## 2015-01-02 DIAGNOSIS — R296 Repeated falls: Secondary | ICD-10-CM | POA: Diagnosis not present

## 2015-01-02 DIAGNOSIS — I6523 Occlusion and stenosis of bilateral carotid arteries: Secondary | ICD-10-CM

## 2015-01-02 DIAGNOSIS — B9620 Unspecified Escherichia coli [E. coli] as the cause of diseases classified elsewhere: Secondary | ICD-10-CM

## 2015-01-02 DIAGNOSIS — I69351 Hemiplegia and hemiparesis following cerebral infarction affecting right dominant side: Secondary | ICD-10-CM

## 2015-01-02 DIAGNOSIS — I10 Essential (primary) hypertension: Secondary | ICD-10-CM

## 2015-01-02 DIAGNOSIS — F329 Major depressive disorder, single episode, unspecified: Secondary | ICD-10-CM

## 2015-01-02 DIAGNOSIS — N390 Urinary tract infection, site not specified: Secondary | ICD-10-CM | POA: Insufficient documentation

## 2015-01-02 MED ORDER — BENAZEPRIL HCL 10 MG PO TABS: 10.0000 mg | ORAL_TABLET | Freq: Every day | ORAL | Status: DC

## 2015-01-02 MED ORDER — SERTRALINE HCL 50 MG PO TABS: 75.0000 mg | ORAL_TABLET | Freq: Every day | ORAL | Status: DC

## 2015-01-02 MED ORDER — TRAMADOL HCL 50 MG PO TABS: 50.0000 mg | ORAL_TABLET | Freq: Two times a day (BID) | ORAL | Status: DC | PRN

## 2015-01-02 NOTE — Assessment & Plan Note (Signed)
CT angio unrevealing at ER recently. ?sinus congestion/allergies contributing. rec start allegra 180mg  daily. Update with effect.

## 2015-01-02 NOTE — Assessment & Plan Note (Signed)
Deterioration in mood endorsed - increase sertraline to 75mg  daily as well as continue trazodone 100mg  nightly.

## 2015-01-02 NOTE — Assessment & Plan Note (Signed)
Finishing keflex course. asxs.

## 2015-01-02 NOTE — Assessment & Plan Note (Signed)
bp stable. Continue current regimen. 

## 2015-01-02 NOTE — Progress Notes (Addendum)
BP 118/68 mmHg  Pulse 84  Temp(Src) 98.4 F (36.9 C) (Oral)  Wt 172 lb 12 oz (78.359 kg)   CC: ER f/u visit  Subjective:    Patient ID: [name] [name], female    DOB: [birth year], 69 y.o.   MRN: [id number]  HPI: [name] [name] is a 69 y.o. female presenting on 01/02/2015 for Follow-up   Recent ER visit for headache and Ecoli UTI s/p treatment with keflex. CT angiogram showed no evidence of aneurysm. Describes several week h/o "excruciating" headache frontal head and pressure behind eyes. No nausea. + photo/phonophobia. Remote h/o migraines.   Never with urinary sxs except for mild dysuria for 1 day.   Last visit found to have depression possibly contributing to pseudodementia although pat also endorsed memory troubles since strokes. Zoloft  was increased to  daily, trazodone  nightly was continued. Feels mood worsening. Requests increased dose.   H/o mult CVAs, compliant with plavix and aspirin. Sees Dr. Corliss Skains regularly Q6 mo - overdue. R residual hemiparesis from remote CVA. Tends to drag R foot.   Relevant past medical, surgical, family and social history reviewed and updated as indicated. Interim medical history since our last visit reviewed. Allergies and medications reviewed and updated. Current Outpatient Prescriptions on File Prior to Visit  Medication Sig  . albuterol (PROVENTIL HFA;VENTOLIN HFA) 108 (90 BASE) MCG/ACT inhaler Inhale 2 puffs into the lungs every 6 (six) hours as needed for wheezing or shortness of breath.   [name] alendronate (FOSAMAX) 70 MG tablet TAKE 1 TABLET WEEKLY. TAKE WITH A FULL GLASS OF WATER ON AN EMPTY STOMACH.  [name] aspirin EC 81 MG tablet Take 81 mg by mouth daily.  [name] atorvastatin (LIPITOR) 40 MG tablet TAKE 1 TABLET EVERY DAY  . Calcium Carb-Cholecalciferol (CALCIUM-VITAMIN D) 600-400 MG-UNIT TABS Take 1 tablet by mouth daily.  . cephALEXin (KEFLEX) 500 MG capsule Take 1 capsule (500 mg total) by mouth 2 (two) times daily.  .  clopidogrel (PLAVIX) 75 MG tablet TAKE 1 TABLET EVERY DAY  . traZODone (DESYREL) 100 MG tablet TAKE 1 TABLET AT BEDTIME AS NEEDED FOR SLEEP  . zoster vaccine live, PF, (ZOSTAVAX) 40981 UNT/0.65ML injection Inject 19,400 Units into the skin once. (Patient not taking: Reported on 01/02/2015)   No current facility-administered medications on file prior to visit.   Past Medical History  Diagnosis Date  . Hypertension   . Stroke 2010    x3 with residual R hemiparesis, s/p R MCA balloon angioplasty (2010)  . Peripheral vascular disease   . COPD (chronic obstructive pulmonary disease) 12/2012    spirometry: Pre: FVC 84%, FEV1 69%, ratio 0.64 consistent with moderate obstruction.  . Hyperlipidemia   . Osteoarthritis     h/o ruptured disc s/p ESI  . Depression   . Smoker     quit 10/2012  . History of chicken pox   . Carotid stenosis     R 50% (12/2012)  . Lower back pain     h/o HNP s/p surgery  . Osteoporosis 11/2010    DEXA -2.7 spine, thoracic compression fracture  . Aneurysm     Past Surgical History  Procedure Laterality Date  . Cholecystectomy  1970's  . Appendectomy  1960's  . Cesarean section    . Cataract extraction      bilateral  . L hip surgery  2006    fractured - screws placed  . Dexa  11/2010    T -2.7 spine, -1.9 hip  Review of Systems Per HPI unless specifically indicated above     Objective:    BP 118/68 mmHg  Pulse 84  Temp(Src) 98.4 F (36.9 C) (Oral)  Wt 172 lb 12 oz (78.359 kg)  Wt Readings from Last 3 Encounters:  01/02/15 172 lb 12 oz (78.359 kg)  12/29/14 170 lb (77.111 kg)  04/03/14 171 lb 4 oz (77.678 kg)    Physical Exam  Constitutional: She appears well-developed and well-nourished. No distress.  HENT:  Mouth/Throat: Oropharynx is clear and moist. No oropharyngeal exudate.  Neck: Carotid bruit is present (L>R). No thyromegaly present.  Cardiovascular: Normal rate, regular rhythm, normal heart sounds and intact distal pulses.   No murmur  heard. Pulmonary/Chest: Effort normal and breath sounds normal. No respiratory distress. She has no wheezes. She has no rales.  Abdominal: Soft. Normal appearance and bowel sounds are normal. She exhibits no distension and no mass. There is no hepatosplenomegaly. There is no tenderness. There is no rigidity, no rebound, no guarding, no CVA tenderness and negative Murphy's sign.  Musculoskeletal: She exhibits no edema.  Neurological:  Needs assistance getting on exam table.  Skin: Skin is warm and dry. No rash noted.  Nursing note and vitals reviewed.  Results for orders placed or performed during the hospital encounter of 12/29/14  Urine culture  Result Value Ref Range   Specimen Description URINE, CLEAN CATCH    Special Requests NONE    Colony Count      >=100,000 COLONIES/ML Performed at [facility]    Culture      ESCHERICHIA COLI Performed at [facility]    Report Status 01/01/2015 FINAL    Organism ID, Bacteria ESCHERICHIA COLI       Susceptibility   Escherichia coli - MIC*    AMPICILLIN RESISTANT      CEFAZOLIN <=4 SENSITIVE Sensitive     CEFTRIAXONE <=1 SENSITIVE Sensitive     CIPROFLOXACIN >=4 RESISTANT Resistant     GENTAMICIN <=1 SENSITIVE Sensitive     LEVOFLOXACIN >=8 RESISTANT Resistant     NITROFURANTOIN <=16 SENSITIVE Sensitive     TOBRAMYCIN <=1 SENSITIVE Sensitive     TRIMETH/SULFA <=20 SENSITIVE Sensitive     PIP/TAZO <=4 SENSITIVE Sensitive     * ESCHERICHIA COLI  CBC with Differential  Result Value Ref Range   WBC 7.3 4.0 - 10.5 K/uL   RBC 4.42 3.87 - 5.11 MIL/uL   Hemoglobin 13.2 12.0 - 15.0 g/dL   HCT 16.1 09.6 - 04.5 %   MCV 92.3 78.0 - 100.0 fL   MCH 29.9 26.0 - 34.0 pg   MCHC 32.4 30.0 - 36.0 g/dL   RDW 40.9 81.1 - 91.4 %   Platelets 154 150 - 400 K/uL   Neutrophils Relative % 63 43 - 77 %   Neutro Abs 4.6 1.7 - 7.7 K/uL   Lymphocytes Relative 27 12 - 46 %   Lymphs Abs 2.0 0.7 - 4.0 K/uL   Monocytes Relative 7 3 - 12 %    Monocytes Absolute 0.5 0.1 - 1.0 K/uL   Eosinophils Relative 2 0 - 5 %   Eosinophils Absolute 0.1 0.0 - 0.7 K/uL   Basophils Relative 1 0 - 1 %   Basophils Absolute 0.1 0.0 - 0.1 K/uL  Urinalysis, Routine w reflex microscopic  Result Value Ref Range   Color, Urine YELLOW YELLOW   APPearance CLOUDY (A) CLEAR   Specific Gravity, Urine 1.022 1.005 - 1.030   pH 6.0  5.0 - 8.0   Glucose, UA NEGATIVE NEGATIVE mg/dL   Hgb urine dipstick NEGATIVE NEGATIVE   Bilirubin Urine NEGATIVE NEGATIVE   Ketones, ur NEGATIVE NEGATIVE mg/dL   Protein, ur NEGATIVE NEGATIVE mg/dL   Urobilinogen, UA 1.0 0.0 - 1.0 mg/dL   Nitrite POSITIVE (A) NEGATIVE   Leukocytes, UA LARGE (A) NEGATIVE  Urine microscopic-add on  Result Value Ref Range   Squamous Epithelial / LPF RARE RARE   WBC, UA 21-50 <3 WBC/hpf   RBC / HPF 0-2 <3 RBC/hpf   Bacteria, UA MANY (A) RARE  I-Stat Chem 8, ED  Result Value Ref Range   Sodium 139 135 - 145 mmol/L   Potassium 4.6 3.5 - 5.1 mmol/L   Chloride 105 96 - 112 mmol/L   BUN 31 (H) 6 - 23 mg/dL   Creatinine, Ser 1.611.10 0.50 - 1.10 mg/dL   Glucose, Bld 096102 (H) 70 - 99 mg/dL   Calcium, Ion 0.451.12 (L) 1.13 - 1.30 mmol/L   TCO2 22 0 - 100 mmol/L   Hemoglobin 14.6 12.0 - 15.0 g/dL   HCT 40.943.0 81.136.0 - 91.446.0 %   MRI head MRA brain (02/2014): IMPRESSION:  1. No evidence of acute intracranial abnormality. Remote bilateral parietal infarcts.  2. Intracranial atherosclerosis with moderate stenosis of the left cavernous carotid and proximal right MCA, likely not significantly changed.  3. Unchanged 5 mm aneurysm at the right petrous-cavernous carotid junction.  CT angio head (12/2014): IMPRESSION: 1. No acute intracranial process. 2. Moderate long segment stenosis of the right M1 segment, similar relative to previous studies. 3. Multi focal atheromatous irregularity within the cavernous ICAs bilaterally, right greater than left. There is superimposed moderate stenosis within the proximal  cavernous right ICA. Additional diffuse atherosclerotic irregularity. 4. Nonvisualization of previously identified aneurysm at the junction of the cavernous and petrous right ICA. No aneurysm identified. Electronically Signed  By: Rise MuBenjamin McClintock M.D.  On: 12/30/2014 02:09    Assessment & Plan:   Problem List Items Addressed This Visit    Recurrent falls    Encouraged PT referral - pt continues to decline. Encouraged aquatic exercise - daughter thinks pt will agree to this.      Hypertension    bp stable. Continue current regimen.      Relevant Medications   benazepril (LOTENSIN) tablet   Hemiparesis affecting right side as late effect of stroke    Pt declines PT. May agree to aquatic exercising at local gym.      Headache    CT angio unrevealing at ER recently. ?sinus congestion/allergies contributing. rec start allegra 180mg  daily. Update with effect.      Relevant Medications   traMADol (ULTRAM) 50 MG tablet   sertraline (ZOLOFT) tablet   E. coli UTI    Finishing keflex course. asxs.      Depression    Deterioration in mood endorsed - increase sertraline to 75mg  daily as well as continue trazodone 100mg  nightly.      Relevant Medications   sertraline (ZOLOFT) tablet   Carotid stenosis - Primary   Relevant Medications   benazepril (LOTENSIN) tablet       Follow up plan: Return if symptoms worsen or fail to improve.

## 2015-01-02 NOTE — Progress Notes (Signed)
Pre visit review using our clinic review tool, if applicable. No additional management support is needed unless otherwise documented below in the visit note. 

## 2015-01-02 NOTE — Assessment & Plan Note (Signed)
Pt declines PT. May agree to aquatic exercising at local gym.

## 2015-01-02 NOTE — Patient Instructions (Addendum)
Call to schedule appointment with Dr Corliss Skainseveshwar.  Increase sertraline to 75mg  daily (1.5 tablets daily).  Trial allegra for allergies - and runny nose. Update me with how you're doing.  Keep next appointment I think pool exercise is a great idea!

## 2015-01-02 NOTE — Assessment & Plan Note (Signed)
Encouraged PT referral - pt continues to decline. Encouraged aquatic exercise - daughter thinks pt will agree to this.

## 2015-01-02 NOTE — Telephone Encounter (Signed)
Post ED Visit - Positive Culture Follow-up  Culture report reviewed by antimicrobial stewardship pharmacist: []  Wes Dulaney, Pharm.D., BCPS [x]  Celedonio MiyamotoJeremy Frens, Pharm.D., BCPS []  Georgina PillionElizabeth Martin, Pharm.D., BCPS []  [place] City[place]h Pham, [address]Ph[address]D., BCPS, AAHIVP []  Estella HuskMichelle Turner, Pharm.D., BCPS, AAHIVP []  Elder CyphersLorie Poole, [address]Pharm[address]D., BCPS  Positive urine culture E. Coli Treated with cephalexin, organism sensitive to the same and no further patient follow-up is required at this time.  Berle MullMiller, Hailey Miles 01/02/2015, 10:33 AM

## 2015-01-06 ENCOUNTER — Emergency Department (HOSPITAL_COMMUNITY)
Admission: EM | Admit: 2015-01-06 | Discharge: 2015-01-06 | Disposition: A | Discharge status: Home / Self Care | Payer: Commercial Managed Care - HMO | Attending: Emergency Medicine | Admitting: Emergency Medicine

## 2015-01-06 ENCOUNTER — Telehealth: Payer: Self-pay | Admitting: Family Medicine

## 2015-01-06 ENCOUNTER — Emergency Department (HOSPITAL_COMMUNITY): Payer: Commercial Managed Care - HMO

## 2015-01-06 ENCOUNTER — Encounter (HOSPITAL_COMMUNITY): Payer: Self-pay

## 2015-01-06 ENCOUNTER — Telehealth: Payer: Self-pay | Admitting: *Deleted

## 2015-01-06 DIAGNOSIS — F329 Major depressive disorder, single episode, unspecified: Secondary | ICD-10-CM | POA: Insufficient documentation

## 2015-01-06 DIAGNOSIS — I10 Essential (primary) hypertension: Secondary | ICD-10-CM | POA: Diagnosis not present

## 2015-01-06 DIAGNOSIS — E785 Hyperlipidemia, unspecified: Secondary | ICD-10-CM | POA: Diagnosis not present

## 2015-01-06 DIAGNOSIS — R519 Headache, unspecified: Secondary | ICD-10-CM

## 2015-01-06 DIAGNOSIS — Z79899 Other long term (current) drug therapy: Secondary | ICD-10-CM | POA: Diagnosis not present

## 2015-01-06 DIAGNOSIS — M810 Age-related osteoporosis without current pathological fracture: Secondary | ICD-10-CM | POA: Insufficient documentation

## 2015-01-06 DIAGNOSIS — R51 Headache: Secondary | ICD-10-CM | POA: Diagnosis not present

## 2015-01-06 DIAGNOSIS — I6523 Occlusion and stenosis of bilateral carotid arteries: Secondary | ICD-10-CM

## 2015-01-06 DIAGNOSIS — I729 Aneurysm of unspecified site: Secondary | ICD-10-CM | POA: Diagnosis not present

## 2015-01-06 DIAGNOSIS — Z87891 Personal history of nicotine dependence: Secondary | ICD-10-CM | POA: Diagnosis not present

## 2015-01-06 DIAGNOSIS — J449 Chronic obstructive pulmonary disease, unspecified: Secondary | ICD-10-CM | POA: Diagnosis not present

## 2015-01-06 DIAGNOSIS — Z7982 Long term (current) use of aspirin: Secondary | ICD-10-CM | POA: Diagnosis not present

## 2015-01-06 DIAGNOSIS — Z7902 Long term (current) use of antithrombotics/antiplatelets: Secondary | ICD-10-CM | POA: Diagnosis not present

## 2015-01-06 DIAGNOSIS — Z8673 Personal history of transient ischemic attack (TIA), and cerebral infarction without residual deficits: Secondary | ICD-10-CM | POA: Diagnosis not present

## 2015-01-06 DIAGNOSIS — I720 Aneurysm of carotid artery: Secondary | ICD-10-CM

## 2015-01-06 DIAGNOSIS — H538 Other visual disturbances: Secondary | ICD-10-CM | POA: Diagnosis not present

## 2015-01-06 DIAGNOSIS — M1990 Unspecified osteoarthritis, unspecified site: Secondary | ICD-10-CM | POA: Diagnosis not present

## 2015-01-06 DIAGNOSIS — Z8619 Personal history of other infectious and parasitic diseases: Secondary | ICD-10-CM | POA: Insufficient documentation

## 2015-01-06 LAB — CBC WITH DIFFERENTIAL/PLATELET
Basophils Absolute: 0.1 10*3/uL (ref 0.0–0.1)
Basophils Relative: 1 % (ref 0–1)
Eosinophils Absolute: 0.2 10*3/uL (ref 0.0–0.7)
Eosinophils Relative: 3 % (ref 0–5)
HCT: 41.4 % (ref 36.0–46.0)
Hemoglobin: 13.3 g/dL (ref 12.0–15.0)
Lymphocytes Relative: 23 % (ref 12–46)
Lymphs Abs: 1.7 10*3/uL (ref 0.7–4.0)
MCH: 29.7 pg (ref 26.0–34.0)
MCHC: 32.1 g/dL (ref 30.0–36.0)
MCV: 92.4 fL (ref 78.0–100.0)
Monocytes Absolute: 0.6 10*3/uL (ref 0.1–1.0)
Monocytes Relative: 8 % (ref 3–12)
Neutro Abs: 4.7 10*3/uL (ref 1.7–7.7)
Neutrophils Relative %: 65 % (ref 43–77)
Platelets: 148 10*3/uL — ABNORMAL LOW (ref 150–400)
RBC: 4.48 MIL/uL (ref 3.87–5.11)
RDW: 13.2 % (ref 11.5–15.5)
WBC: 7.2 10*3/uL (ref 4.0–10.5)

## 2015-01-06 LAB — COMPREHENSIVE METABOLIC PANEL
ALT: 27 U/L (ref 0–35)
AST: 26 U/L (ref 0–37)
Albumin: 3.9 g/dL (ref 3.5–5.2)
Alkaline Phosphatase: 74 U/L (ref 39–117)
Anion gap: 9 (ref 5–15)
BUN: 19 mg/dL (ref 6–23)
CO2: 27 mmol/L (ref 19–32)
Calcium: 9.8 mg/dL (ref 8.4–10.5)
Chloride: 103 mmol/L (ref 96–112)
Creatinine, Ser: 1.05 mg/dL (ref 0.50–1.10)
GFR calc Af Amer: 62 mL/min — ABNORMAL LOW (ref >90)
GFR calc non Af Amer: 53 mL/min — ABNORMAL LOW (ref >90)
Glucose, Bld: 86 mg/dL (ref 70–99)
Potassium: 4.3 mmol/L (ref 3.5–5.1)
Sodium: 139 mmol/L (ref 135–145)
Total Bilirubin: 0.5 mg/dL (ref 0.3–1.2)
Total Protein: 7.1 g/dL (ref 6.0–8.3)

## 2015-01-06 LAB — URINALYSIS, ROUTINE W REFLEX MICROSCOPIC
Bilirubin Urine: NEGATIVE
Glucose, UA: NEGATIVE mg/dL
Hgb urine dipstick: NEGATIVE
Ketones, ur: NEGATIVE mg/dL
Leukocytes, UA: NEGATIVE
Nitrite: NEGATIVE
Protein, ur: NEGATIVE mg/dL
Specific Gravity, Urine: 1.022 (ref 1.005–1.030)
Urobilinogen, UA: 0.2 mg/dL (ref 0.0–1.0)
pH: 5 (ref 5.0–8.0)

## 2015-01-06 MED ORDER — HYDROCODONE-ACETAMINOPHEN 5-325 MG PO TABS: 1.0000 | ORAL_TABLET | Freq: Four times a day (QID) | ORAL | Status: DC | PRN

## 2015-01-06 MED ORDER — ONDANSETRON HCL 4 MG/2ML IJ SOLN
4.0000 mg | Freq: Once | INTRAMUSCULAR | Status: AC
  Administered 2015-01-06: 4 mg via INTRAVENOUS
  Filled 2015-01-06: qty 2

## 2015-01-06 MED ORDER — MORPHINE SULFATE 4 MG/ML IJ SOLN
4.0000 mg | Freq: Once | INTRAMUSCULAR | Status: AC
  Administered 2015-01-06: 4 mg via INTRAVENOUS
  Filled 2015-01-06: qty 1

## 2015-01-06 NOTE — ED Notes (Signed)
Patient in MRI 

## 2015-01-06 NOTE — Telephone Encounter (Signed)
Attempted to call pts daughter.  Left message on Vm to return call.

## 2015-01-06 NOTE — ED Notes (Signed)
EDP talking with patient's family. Patient in MRI. Family did want to have urine tested.

## 2015-01-06 NOTE — Telephone Encounter (Signed)
Pts daughter is calling to ask someone to give her a call back. Her mother is still having a lot of pressure in her head and is still off balance. Pts daughter has called Dr Corliss Skainseveshwar Friday and yesterday to try to get the dye test done but she is not getting a return call from them and is really worried about her mother. Pt is requesting a call back as soon as possible.

## 2015-01-06 NOTE — ED Notes (Signed)
MRI called patient stated she is nauseated. Provider notified.

## 2015-01-06 NOTE — Telephone Encounter (Signed)
Order placed for IR referral to Dr Loni Beckwitheveswhar. Pt having difficulty getting through.

## 2015-01-06 NOTE — Discharge Instructions (Signed)
Hydrocodone as prescribed as needed for pain.  Follow-up with your primary Dr. if not improving in the next few days.  Return to the ER if your symptoms significantly worsen or change.   General Headache Without Cause A general headache is pain or discomfort felt around the head or neck area. The cause may not be found.  HOME CARE   Keep all doctor visits.  Only take medicines as told by your doctor.  Lie down in a dark, quiet room when you have a headache.  Keep a journal to find out if certain things bring on headaches. For example, write down:  What you eat and drink.  How much sleep you get.  Any change to your diet or medicines.  Relax by getting a massage or doing other relaxing activities.  Put ice or heat packs on the head and neck area as told by your doctor.  Lessen stress.  Sit up straight. Do not tighten (tense) your muscles.  Quit smoking if you smoke.  Lessen how much alcohol you drink.  Lessen how much caffeine you drink, or stop drinking caffeine.  Eat and sleep on a regular schedule.  Get 7 to 9 hours of sleep, or as told by your doctor.  Keep lights dim if bright lights bother you or make your headaches worse. GET HELP RIGHT AWAY IF:   Your headache becomes really bad.  You have a fever.  You have a stiff neck.  You have trouble seeing.  Your muscles are weak, or you lose muscle control.  You lose your balance or have trouble walking.  You feel like you will pass out (faint), or you pass out.  You have really bad symptoms that are different than your first symptoms.  You have problems with the medicines given to you by your doctor.  Your medicines do not work.  Your headache feels different than the other headaches.  You feel sick to your stomach (nauseous) or throw up (vomit). MAKE SURE YOU:   Understand these instructions.  Will watch your condition.  Will get help right away if you are not doing well or get  worse. Document Released: 06/21/2008 Document Revised: 12/05/2011 Document Reviewed: 09/02/2011 [facility] Patient Information 2015 [employer] [place], [place]. This information is not intended to replace advice given to you by your health care provider. Make sure you discuss any questions you have with your health care provider.

## 2015-01-06 NOTE — Telephone Encounter (Signed)
Can we schedule patient at 4pm today for re evaluation?

## 2015-01-06 NOTE — Telephone Encounter (Signed)
[name]  Pt daughter is asking if we can order a dye test while she is there? Pt says she can barely see and daughter says it seems like there is a film on her eyes.  Please return call to [phone]. Pt says that she may not get service while in the hospital.

## 2015-01-06 NOTE — Telephone Encounter (Signed)
Pts daughter called in to give update, pt was just taken back for MRI. She will call back once she knows more.

## 2015-01-06 NOTE — Telephone Encounter (Signed)
pts daughter called states mother was seen in the ER, had the MRI and it was negative.  Pt is still complaining about her head.  Archie Pattenonya is requesting [facility] to call and schedule the dye test.  Please advise

## 2015-01-06 NOTE — ED Notes (Signed)
Zofran given at MRI states developed some nausea.

## 2015-01-06 NOTE — ED Provider Notes (Signed)
CSN: [id number]     Arrival date & time 01/06/15  1157 History   First MD Initiated Contact with Patient 01/06/15 1315     Chief Complaint  Patient presents with  . Headache     (Consider location/radiation/quality/duration/timing/severity/associated sxs/prior Treatment) HPI Comments: Patient is a 69 year old female with past medical history of hypertension, CVA, COPD. She presents for evaluation of headache for the past 2 weeks. She describes it as a pressure throughout her head and reports blurry vision and nausea. She was seen here last week for similar complaints and had a normal head CT and urinalysis. She is not feeling any better and returns to be reevaluated.  Patient is a 69 y.o. female presenting with headaches. The history is provided by the patient.  Headache Pain location:  Generalized Quality:  Dull Onset quality:  Gradual Duration:  2 weeks Timing:  Constant Progression:  Worsening Chronicity:  New Similar to prior headaches: no   Context: activity   Relieved by:  Nothing Worsened by:  Activity and light Ineffective treatments:  None tried Associated symptoms: blurred vision   Associated symptoms: no fever, no neck pain and no URI     Past Medical History  Diagnosis Date  . Hypertension   . Stroke 2010    x3 with residual R hemiparesis, s/p R MCA balloon angioplasty (2010)  . Peripheral vascular disease   . COPD (chronic obstructive pulmonary disease) 12/2012    spirometry: Pre: FVC 84%, FEV1 69%, ratio 0.64 consistent with moderate obstruction.  . Hyperlipidemia   . Osteoarthritis     h/o ruptured disc s/p ESI  . Depression   . Smoker     quit 10/2012  . History of chicken pox   . Carotid stenosis     R 50% (12/2012)  . Lower back pain     h/o HNP s/p surgery  . Osteoporosis 11/2010    DEXA -2.7 spine, thoracic compression fracture  . Aneurysm    Past Surgical History  Procedure Laterality Date  . Cholecystectomy  1970's  . Appendectomy  1960's  .  Cesarean section    . Cataract extraction      bilateral  . L hip surgery  2006    fractured - screws placed  . Dexa  11/2010    T -2.7 spine, -1.9 hip   Family History  Problem Relation Age of Onset  . CAD Mother     MI  . CAD Maternal Aunt     MI  . CAD Sister     MI  . Sudden death Father 4    died in his sleep, chain smoker  . Cirrhosis Father   . Cancer Mother     cervical  . Hypertension Daughter   . Diabetes Maternal Aunt   . Cancer Maternal Grandmother     cervical   History  Substance Use Topics  . Smoking status: Former Smoker -- 1.00 packs/day for 45 years    Types: Cigarettes  . Smokeless tobacco: Never Used  . Alcohol Use: No   OB History    No data available     Review of Systems  Constitutional: Negative for fever.  Eyes: Positive for blurred vision.  Musculoskeletal: Negative for neck pain.  Neurological: Positive for headaches.  All other systems reviewed and are negative.     Allergies  Doxycycline  Home Medications   Prior to Admission medications   Medication Sig Start Date End Date Taking? Authorizing Provider  albuterol (PROVENTIL  HFA;VENTOLIN HFA) 108 (90 BASE) MCG/ACT inhaler Inhale 2 puffs into the lungs every 6 (six) hours as needed for wheezing or shortness of breath.     Historical Provider, MD  alendronate (FOSAMAX) 70 MG tablet TAKE 1 TABLET WEEKLY. TAKE WITH A FULL GLASS OF WATER ON AN EMPTY STOMACH. 12/12/14   Eustaquio BoydenJavier Gutierrez, MD  aspirin EC 81 MG tablet Take 81 mg by mouth daily.    Historical Provider, MD  atorvastatin (LIPITOR) 40 MG tablet TAKE 1 TABLET EVERY DAY 01/01/15   Eustaquio BoydenJavier Gutierrez, MD  benazepril (LOTENSIN) 10 MG tablet Take 1 tablet (10 mg total) by mouth daily. 01/02/15   Eustaquio BoydenJavier Gutierrez, MD  Calcium Carb-Cholecalciferol (CALCIUM-VITAMIN D) 600-400 MG-UNIT TABS Take 1 tablet by mouth daily.    Historical Provider, MD  clopidogrel (PLAVIX) 75 MG tablet TAKE 1 TABLET EVERY DAY 01/01/15   Eustaquio BoydenJavier Gutierrez, MD   fexofenadine (ALLEGRA) 180 MG tablet Take 180 mg by mouth daily.    Historical Provider, MD  sertraline (ZOLOFT) 50 MG tablet Take 1.5 tablets (75 mg total) by mouth daily. 01/02/15   Eustaquio BoydenJavier Gutierrez, MD  traMADol (ULTRAM) 50 MG tablet Take 1 tablet (50 mg total) by mouth 2 (two) times daily as needed. 01/02/15   Eustaquio BoydenJavier Gutierrez, MD  traZODone (DESYREL) 100 MG tablet TAKE 1 TABLET AT BEDTIME AS NEEDED FOR SLEEP 10/03/14   Eustaquio BoydenJavier Gutierrez, MD  zoster vaccine live, PF, (ZOSTAVAX) [id number] UNT/0.65ML injection Inject 19,400 Units into the skin once. Patient not taking: Reported on 01/02/2015 01/02/14   Eustaquio BoydenJavier Gutierrez, MD   BP 118/58 mmHg  Pulse 79  Temp(Src) 98.7 F (37.1 C) (Oral)  Resp 11  Ht 5' (1.524 m)  Wt 170 lb (77.111 kg)  BMI 33.20 kg/m2  SpO2 97% Physical Exam  Constitutional: She is oriented to person, place, and time. She appears well-developed and well-nourished. No distress.  HENT:  Head: Normocephalic and atraumatic.  Eyes: EOM are normal. Pupils are equal, round, and reactive to light.  There is no papilledema  Neck: Normal range of motion. Neck supple.  Cardiovascular: Normal rate and regular rhythm.  Exam reveals no gallop and no friction rub.   No murmur heard. Pulmonary/Chest: Effort normal and breath sounds normal. No respiratory distress. She has no wheezes.  Abdominal: Soft. Bowel sounds are normal. She exhibits no distension. There is no tenderness.  Musculoskeletal: Normal range of motion.  Neurological: She is alert and oriented to person, place, and time. No cranial nerve deficit. She exhibits normal muscle tone. Coordination normal.  Skin: Skin is warm and dry. She is not diaphoretic.  Nursing note and vitals reviewed.   ED Course  Procedures (including critical care time) Labs Review Labs Reviewed  CBC WITH DIFFERENTIAL/PLATELET - Abnormal; Notable for the following:    Platelets 148 (*)    All other components within normal limits  COMPREHENSIVE METABOLIC  PANEL - Abnormal; Notable for the following:    GFR calc non Af Amer 53 (*)    GFR calc Af Amer 62 (*)    All other components within normal limits  URINALYSIS, ROUTINE W REFLEX MICROSCOPIC    Imaging Review No results found.   EKG Interpretation None      MDM   Final diagnoses:  None    Patient is a 69 year old female with history of CVA who presents with complaints of headache. She has a known aneurysm. She had a CT scan done last week which was unremarkable and she now returns with ongoing  discomfort. An MRI with and without contrast was obtained of the brain which revealed no evidence for acute process. There was no bleeding and the patient is neurologically intact. She is feeling somewhat better after pain medication in the ER. I found nothing emergent in the workup and see no indication for hospitalization or further testing at this time. I will prescribe pain medication and feel as though she is appropriate for discharge.  Patient had a urinary tract infection diagnosed last week and the family is requesting a repeat urinalysis to be sure that this has cleared up. This will be obtained and if negative the patient will be discharged.    Geoffery Lyons, MD 01/06/15 [phone]

## 2015-01-06 NOTE — ED Notes (Signed)
Pt. Has head pressure since March 28, th. Was seen by us last week, but pt. Is not feeling any better.  Daughter reports eye sight has a film over the eyes.  G[facility] 14,  Pt. Having nausea

## 2015-01-09 ENCOUNTER — Other Ambulatory Visit (HOSPITAL_COMMUNITY): Payer: Self-pay | Admitting: Interventional Radiology

## 2015-01-09 DIAGNOSIS — I771 Stricture of artery: Secondary | ICD-10-CM

## 2015-01-09 DIAGNOSIS — I671 Cerebral aneurysm, nonruptured: Secondary | ICD-10-CM

## 2015-01-09 NOTE — Telephone Encounter (Signed)
Called IR Dept and spoke with tech about the patient, she asked that [facility] Dr Corliss Skainse[facility]hwar. Faxed referral and [facility] Waiting to hear back and also called daughter to let her know we were working on it.

## 2015-01-12 DIAGNOSIS — H5213 Myopia, bilateral: Secondary | ICD-10-CM | POA: Diagnosis not present

## 2015-01-12 DIAGNOSIS — H40013 Open angle with borderline findings, low risk, bilateral: Secondary | ICD-10-CM | POA: Diagnosis not present

## 2015-01-12 DIAGNOSIS — Z961 Presence of intraocular lens: Secondary | ICD-10-CM | POA: Diagnosis not present

## 2015-01-12 DIAGNOSIS — H26492 Other secondary cataract, left eye: Secondary | ICD-10-CM | POA: Diagnosis not present

## 2015-01-12 DIAGNOSIS — H35033 Hypertensive retinopathy, bilateral: Secondary | ICD-10-CM | POA: Diagnosis not present

## 2015-01-14 ENCOUNTER — Other Ambulatory Visit: Payer: Self-pay | Admitting: Radiology

## 2015-01-14 ENCOUNTER — Encounter: Payer: Medicare HMO | Admitting: Family Medicine

## 2015-01-16 ENCOUNTER — Ambulatory Visit (HOSPITAL_COMMUNITY): Admission: RE | Admit: 2015-01-16 | Payer: Commercial Managed Care - HMO | Source: Ambulatory Visit

## 2015-01-19 DIAGNOSIS — H26491 Other secondary cataract, right eye: Secondary | ICD-10-CM | POA: Diagnosis not present

## 2015-01-29 ENCOUNTER — Ambulatory Visit (INDEPENDENT_AMBULATORY_CARE_PROVIDER_SITE_OTHER): Payer: Commercial Managed Care - HMO | Admitting: Family Medicine

## 2015-01-29 ENCOUNTER — Encounter: Payer: Self-pay | Admitting: Family Medicine

## 2015-01-29 VITALS — BP 134/82 | HR 64 | Temp 98.3°F | Ht 60.5 in | Wt 174.8 lb

## 2015-01-29 DIAGNOSIS — M810 Age-related osteoporosis without current pathological fracture: Secondary | ICD-10-CM

## 2015-01-29 DIAGNOSIS — Z0000 Encounter for general adult medical examination without abnormal findings: Secondary | ICD-10-CM

## 2015-01-29 DIAGNOSIS — I6523 Occlusion and stenosis of bilateral carotid arteries: Secondary | ICD-10-CM

## 2015-01-29 DIAGNOSIS — F331 Major depressive disorder, recurrent, moderate: Secondary | ICD-10-CM | POA: Diagnosis not present

## 2015-01-29 DIAGNOSIS — R296 Repeated falls: Secondary | ICD-10-CM

## 2015-01-29 DIAGNOSIS — E785 Hyperlipidemia, unspecified: Secondary | ICD-10-CM | POA: Diagnosis not present

## 2015-01-29 DIAGNOSIS — Z1211 Encounter for screening for malignant neoplasm of colon: Secondary | ICD-10-CM

## 2015-01-29 DIAGNOSIS — I69351 Hemiplegia and hemiparesis following cerebral infarction affecting right dominant side: Secondary | ICD-10-CM

## 2015-01-29 DIAGNOSIS — Z0001 Encounter for general adult medical examination with abnormal findings: Secondary | ICD-10-CM | POA: Insufficient documentation

## 2015-01-29 DIAGNOSIS — I10 Essential (primary) hypertension: Secondary | ICD-10-CM

## 2015-01-29 DIAGNOSIS — Z7189 Other specified counseling: Secondary | ICD-10-CM | POA: Insufficient documentation

## 2015-01-29 LAB — LIPID PANEL
Cholesterol: 177 mg/dL (ref 0–200)
HDL: 58.7 mg/dL (ref >39.00)
LDL Cholesterol: 96 mg/dL (ref 0–99)
NonHDL: 118.3
Total CHOL/HDL Ratio: 3
Triglycerides: 110 mg/dL (ref 0.0–149.0)
VLDL: 22 mg/dL (ref 0.0–40.0)

## 2015-01-29 MED ORDER — SERTRALINE HCL 100 MG PO TABS: 100.0000 mg | ORAL_TABLET | Freq: Every day | ORAL | Status: DC

## 2015-01-29 MED ORDER — ZOSTER VACCINE LIVE 19400 UNT/0.65ML ~~LOC~~ SOLR: 0.6500 mL | Freq: Once | SUBCUTANEOUS | Status: DC

## 2015-01-29 NOTE — Assessment & Plan Note (Signed)
Persistent depression with evidence of pseudodementia on testing today and PHQ9 = 16 today. Increase sertraline to 100mg  daily. Continue trazodone 100mg  nightly.

## 2015-01-29 NOTE — Assessment & Plan Note (Signed)
Declines PT referral. Encouraged aquatic exercise.

## 2015-01-29 NOTE — Progress Notes (Signed)
Pre visit review using our clinic review tool, if applicable. No additional management support is needed unless otherwise documented below in the visit note. 

## 2015-01-29 NOTE — Assessment & Plan Note (Signed)
Overdue for FLP - check today.

## 2015-01-29 NOTE — Assessment & Plan Note (Signed)
Advanced directives discussed - interested in setting up. Provided with packet. Would want daughter Alexia Freestoneatty to be HCPOA.

## 2015-01-29 NOTE — Assessment & Plan Note (Signed)

## 2015-01-29 NOTE — Assessment & Plan Note (Signed)
Continue fosamax, cal/vit D. Consider rpt dexa.

## 2015-01-29 NOTE — Assessment & Plan Note (Addendum)
Continue f/u with IR

## 2015-01-29 NOTE — Assessment & Plan Note (Signed)
Preventative protocols reviewed and updated unless pt declined. Discussed healthy diet and lifestyle.  

## 2015-01-29 NOTE — Patient Instructions (Addendum)
Let's increase sertraline to 116m daily. New dose sent to pharmacy. Sign release for records from last colonoscopy.  Pass by lab to pick up stool kit. And for blood work Advanced directive packet provided today. I [facility] with [facility]  Good to see you today, return as needed or in 6 months for follow up.

## 2015-01-29 NOTE — Assessment & Plan Note (Signed)
Stable. Continue aspirin/plavix.

## 2015-01-29 NOTE — Progress Notes (Signed)
BP 134/82 mmHg  Pulse 64  Temp(Src) 98.3 F (36.8 C) (Oral)  Ht 5' 0.5" (1.537 m)  Wt 174 lb 12 oz (79.266 kg)  BMI 33.55 kg/m2   CC: medicare wellness visit  Subjective:    Patient ID: [name] [name], female    DOB: 01/09/1946, 69 y.o.   MRN: [id number]  HPI: [name] [name] is a 69 y.o. female presenting on 01/29/2015 for Annual Exam   Recent ER visit with HA and Ecoli UTI s/p keflex treatment. Followed by Dr Corliss Skainseveshwar for   ?pseudodementia - zoloft 75mg  daily, trazodone 100mg  nightly.   Carotid stesnosis - followed by Dr Corliss Skainseveshwar.  Urinary incontinence - wears pads for years. Urge incontinence, no stress sxs. Denies UTI sxs currently.  h/o mult CVAs, compliant with plavix and aspirin. Sees Dr. Corliss Skainseveshwar regularly - told has residual aneurysm. Sees every 6 months. R residual hemiparesis from remote CVA. Tends to drag R foot. Quit smoking 2014 - remains abstinent.   Hearing screen passed Vision screen per eye doctor - saw ophtho recently s/p R laser surgery, pending L side treatment.  1 fall in last year without injury. H/o recurrent falls. Declined PT, considering aquatic exercise. PHQ9 = 16. No SI/HI. Recent increase in sertraline to 75mg  daily.  Preventative: Pap smear normal 11/2012 - strong fmhx cervical cancer. Will continue screening Q631yrs (2017 will be due). Mammogram normal 11/2014 Colonoscopy 2010 - normal per pt ([place])  DEXA Date: 11/2010 T -2.7 spine, -1.9 hip Osteoporosis Pneumovax - 11/2012. prevnar 12/2013. Flu shot - 06/2013 Tetanus - 2006  zostavax - interested - has not received yet.  Advanced directives discussed - interested in setting up. Provided with packet. Would want daughter Alexia Freestoneatty to be HCPOA.  Seat belt use discussed Sunscreen use discussed. No suspicious moles.  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 Interim medical history since our last visit reviewed. Allergies and medications reviewed and updated. Current Outpatient Prescriptions on File Prior to Visit  Medication Sig  . albuterol (PROVENTIL HFA;VENTOLIN HFA) 108 (90 BASE) MCG/ACT inhaler Inhale 2 puffs into the lungs every 6 (six) hours as needed for wheezing or shortness of breath.   [name]. alendronate (FOSAMAX) 70 MG tablet TAKE 1 TABLET WEEKLY. TAKE WITH A FULL GLASS OF WATER ON AN EMPTY STOMACH.  [name]. aspirin EC 81 MG tablet Take 81 mg by mouth daily.  [name]. atorvastatin (LIPITOR) 40 MG tablet TAKE 1 TABLET EVERY DAY  . benazepril (LOTENSIN) 10 MG tablet Take 1 tablet (10 mg total) by mouth daily.  . Calcium Carb-Cholecalciferol (CALCIUM-VITAMIN D) 600-400 MG-UNIT TABS Take 1 tablet by mouth daily.  . clopidogrel (PLAVIX) 75 MG tablet TAKE 1 TABLET EVERY DAY  . fexofenadine (ALLEGRA) 180 MG tablet Take 180 mg by mouth daily.  [name]. HYDROcodone-acetaminophen (NORCO) 5-325 MG per tablet Take 1-2 tablets by mouth every 6 (six) hours as needed.  . traMADol (ULTRAM) 50 MG tablet Take 1 tablet (50 mg total) by mouth 2 (two) times daily as needed.  . traZODone (DESYREL) 100 MG tablet TAKE 1 TABLET AT BEDTIME AS NEEDED FOR SLEEP   No current facility-administered medications on file prior to visit.    Review of Systems  Constitutional: Negative for fever, chills, activity change, appetite change, fatigue and unexpected weight change.  HENT: Negative for hearing loss.  Eyes: Negative for visual disturbance.  Respiratory: Positive for shortness of breath. Negative for cough, chest tightness and wheezing.   Cardiovascular: Negative for chest pain, palpitations and leg swelling.  Gastrointestinal: Negative for nausea, vomiting, abdominal pain, diarrhea, constipation, blood in stool and abdominal distention.  Genitourinary: Negative for hematuria and  difficulty urinating.  Musculoskeletal: Negative for myalgias, arthralgias and neck pain.  Skin: Negative for rash.  Neurological: Positive for headaches. Negative for dizziness, seizures and syncope.  Hematological: Negative for adenopathy. Bruises/bleeds easily.  Psychiatric/Behavioral: Positive for dysphoric mood. The patient is nervous/anxious.    Per HPI unless specifically indicated above     Objective:    BP 134/82 mmHg  Pulse 64  Temp(Src) 98.3 F (36.8 C) (Oral)  Ht 5' 0.5" (1.537 m)  Wt 174 lb 12 oz (79.266 kg)  BMI 33.55 kg/m2  Wt Readings from Last 3 Encounters:  01/29/15 174 lb 12 oz (79.266 kg)  01/06/15 170 lb (77.111 kg)  01/02/15 172 lb 12 oz (78.359 kg)    Physical Exam  Constitutional: She is oriented to person, place, and time. She appears well-developed and well-nourished. No distress.  HENT:  Head: Normocephalic and atraumatic.  Right Ear: Hearing, tympanic membrane, external ear and ear canal normal.  Left Ear: Hearing, tympanic membrane, external ear and ear canal normal.  Nose: Nose normal.  Mouth/Throat: Uvula is midline, oropharynx is clear and moist and mucous membranes are normal. No oropharyngeal exudate, posterior oropharyngeal edema or posterior oropharyngeal erythema.  Eyes: Conjunctivae and EOM are normal. Pupils are equal, round, and reactive to light. No scleral icterus.  Neck: Normal range of motion. Neck supple. Carotid bruit is present (bilateral). No thyromegaly present.  Cardiovascular: Normal rate, regular rhythm, normal heart sounds and intact distal pulses.   No murmur heard. Pulses:      Radial pulses are 2+ on the right side, and 2+ on the left side.  Pulmonary/Chest: Effort normal and breath sounds normal. No respiratory distress. She has no wheezes. She has no rales.  Abdominal: Soft. Bowel sounds are normal. She exhibits no distension and no mass. There is no tenderness. There is no rebound and no guarding.  Genitourinary:  GYN  - deferred until next year  Musculoskeletal: Normal range of motion. She exhibits no edema.  Lymphadenopathy:    She has no cervical adenopathy.  Neurological: She is alert and oriented to person, place, and time.  CN grossly intact, station and gait intact Recall 2/3, 2/3 with cue Calculation - does not want to attempt math or spelling  Skin: Skin is warm and dry. No rash noted.  Psychiatric: She has a normal mood and affect. Her behavior is normal. Judgment and thought content normal.  Nursing note and vitals reviewed.  Results for orders placed or performed during the hospital encounter of 01/06/15  CBC with Differential  Result Value Ref Range   WBC 7.2 4.0 - 10.5 K/uL   RBC 4.48 3.87 - 5.11 MIL/uL   Hemoglobin 13.3 12.0 - 15.0 g/dL   HCT 16.141.4 09.636.0 - 04.546.0 %   MCV 92.4 78.0 - 100.0 fL   MCH 29.7 26.0 - 34.0 pg   MCHC 32.1 30.0 - 36.0 g/dL   RDW 40.913.2 81.111.5 - 91.415.5 %   Platelets 148 (L) 150 - 400 K/uL   Neutrophils Relative % 65 43 - 77 %   Neutro Abs 4.7 1.7 - 7.7 K/uL   Lymphocytes Relative 23 12 - 46 %   Lymphs Abs 1.7 0.7 -  4.0 K/uL   Monocytes Relative 8 3 - 12 %   Monocytes Absolute 0.6 0.1 - 1.0 K/uL   Eosinophils Relative 3 0 - 5 %   Eosinophils Absolute 0.2 0.0 - 0.7 K/uL   Basophils Relative 1 0 - 1 %   Basophils Absolute 0.1 0.0 - 0.1 K/uL  Comprehensive metabolic panel  Result Value Ref Range   Sodium 139 135 - 145 mmol/L   Potassium 4.3 3.5 - 5.1 mmol/L   Chloride 103 96 - 112 mmol/L   CO2 27 19 - 32 mmol/L   Glucose, Bld 86 70 - 99 mg/dL   BUN 19 6 - 23 mg/dL   Creatinine, Ser 4.09 0.50 - 1.10 mg/dL   Calcium 9.8 8.4 - 81.1 mg/dL   Total Protein 7.1 6.0 - 8.3 g/dL   Albumin 3.9 3.5 - 5.2 g/dL   AST 26 0 - 37 U/L   ALT 27 0 - 35 U/L   Alkaline Phosphatase 74 39 - 117 U/L   Total Bilirubin 0.5 0.3 - 1.2 mg/dL   GFR calc non Af Amer 53 (L) >90 mL/min   GFR calc Af Amer 62 (L) >90 mL/min   Anion gap 9 5 - 15  Urinalysis, Routine w reflex microscopic   Result Value Ref Range   Color, Urine YELLOW YELLOW   APPearance CLOUDY (A) CLEAR   Specific Gravity, Urine 1.022 1.005 - 1.030   pH 5.0 5.0 - 8.0   Glucose, UA NEGATIVE NEGATIVE mg/dL   Hgb urine dipstick NEGATIVE NEGATIVE   Bilirubin Urine NEGATIVE NEGATIVE   Ketones, ur NEGATIVE NEGATIVE mg/dL   Protein, ur NEGATIVE NEGATIVE mg/dL   Urobilinogen, UA 0.2 0.0 - 1.0 mg/dL   Nitrite NEGATIVE NEGATIVE   Leukocytes, UA NEGATIVE NEGATIVE      Assessment & Plan:   Problem List Items Addressed This Visit    Recurrent falls    Declines PT referral. Encouraged aquatic exercise.      Osteoporosis    Continue fosamax, cal/vit D. Consider rpt dexa.      Medicare annual wellness visit, subsequent - Primary    I have personally reviewed the Medicare Annual Wellness questionnaire and have noted 1. The patient's medical and social history 2. Their use of alcohol, tobacco or illicit drugs 3. Their current medications and supplements 4. The patient's functional ability including ADL's, fall risks, home safety risks and hearing or visual impairment. 5. Diet and physical activity 6. Evidence for depression or mood disorders The patients weight, height, BMI have been recorded in the chart.  Hearing and vision has been addressed. I have made referrals, counseling and provided education to the patient based review of the above and I have provided the pt with a written personalized care plan for preventive services. Provider list updated - see scanned questionairre. Reviewed preventative protocols and updated unless pt declined.       MDD (major depressive disorder), recurrent episode, moderate    Persistent depression with evidence of pseudodementia on testing today and PHQ9 = 16 today. Increase sertraline to  daily. Continue trazodone  nightly.      Relevant Medications   sertraline (ZOLOFT) 100 MG tablet   Hypertension    Chronic, stable. Continue regimen.       Hyperlipidemia    Overdue for FLP - check today.      Relevant Orders   Lipid panel   Hemiparesis affecting right side as late effect of stroke    Stable. Continue aspirin/plavix.  Health maintenance examination    Preventative protocols reviewed and updated unless pt declined. Discussed healthy diet and lifestyle.       Carotid stenosis    Continue f/u with IR      Advanced care planning/counseling discussion    Advanced directives discussed - interested in setting up. Provided with packet. Would want daughter Alexia Freestone to be HCPOA.        Other Visit Diagnoses    Special screening for malignant neoplasms, colon        Relevant Orders    Fecal occult blood, imunochemical        Follow up plan: Return in about 6 months (around 08/01/2015), or as needed, for follow up visit.

## 2015-01-29 NOTE — Assessment & Plan Note (Signed)
Chronic, stable. Continue regimen. 

## 2015-01-30 ENCOUNTER — Encounter: Payer: Self-pay | Admitting: Family Medicine

## 2015-01-30 ENCOUNTER — Encounter: Payer: Self-pay | Admitting: Gastroenterology

## 2015-02-04 ENCOUNTER — Other Ambulatory Visit: Payer: Commercial Managed Care - HMO

## 2015-02-04 DIAGNOSIS — Z1211 Encounter for screening for malignant neoplasm of colon: Secondary | ICD-10-CM

## 2015-02-04 LAB — FECAL OCCULT BLOOD, IMMUNOCHEMICAL: Fecal Occult Bld: NEGATIVE

## 2015-02-04 LAB — FECAL OCCULT BLOOD, GUAIAC: Fecal Occult Blood: NEGATIVE

## 2015-02-05 ENCOUNTER — Encounter: Payer: Self-pay | Admitting: *Deleted

## 2015-02-05 ENCOUNTER — Other Ambulatory Visit: Payer: Self-pay | Admitting: Radiology

## 2015-02-06 ENCOUNTER — Other Ambulatory Visit (HOSPITAL_COMMUNITY): Payer: Self-pay | Admitting: Interventional Radiology

## 2015-02-06 ENCOUNTER — Ambulatory Visit (HOSPITAL_COMMUNITY)
Admission: RE | Admit: 2015-02-06 | Discharge: 2015-02-06 | Disposition: A | Discharge status: Home / Self Care | Payer: Commercial Managed Care - HMO | Source: Ambulatory Visit | Attending: Interventional Radiology | Admitting: Interventional Radiology

## 2015-02-06 ENCOUNTER — Encounter (HOSPITAL_COMMUNITY): Payer: Self-pay

## 2015-02-06 DIAGNOSIS — Z87891 Personal history of nicotine dependence: Secondary | ICD-10-CM | POA: Insufficient documentation

## 2015-02-06 DIAGNOSIS — M810 Age-related osteoporosis without current pathological fracture: Secondary | ICD-10-CM | POA: Insufficient documentation

## 2015-02-06 DIAGNOSIS — M1990 Unspecified osteoarthritis, unspecified site: Secondary | ICD-10-CM | POA: Diagnosis not present

## 2015-02-06 DIAGNOSIS — I6523 Occlusion and stenosis of bilateral carotid arteries: Secondary | ICD-10-CM | POA: Insufficient documentation

## 2015-02-06 DIAGNOSIS — Z79899 Other long term (current) drug therapy: Secondary | ICD-10-CM | POA: Diagnosis not present

## 2015-02-06 DIAGNOSIS — Z79891 Long term (current) use of opiate analgesic: Secondary | ICD-10-CM | POA: Insufficient documentation

## 2015-02-06 DIAGNOSIS — Z7982 Long term (current) use of aspirin: Secondary | ICD-10-CM | POA: Insufficient documentation

## 2015-02-06 DIAGNOSIS — I739 Peripheral vascular disease, unspecified: Secondary | ICD-10-CM | POA: Insufficient documentation

## 2015-02-06 DIAGNOSIS — I6601 Occlusion and stenosis of right middle cerebral artery: Secondary | ICD-10-CM | POA: Insufficient documentation

## 2015-02-06 DIAGNOSIS — I10 Essential (primary) hypertension: Secondary | ICD-10-CM | POA: Insufficient documentation

## 2015-02-06 DIAGNOSIS — E785 Hyperlipidemia, unspecified: Secondary | ICD-10-CM | POA: Diagnosis not present

## 2015-02-06 DIAGNOSIS — I771 Stricture of artery: Secondary | ICD-10-CM

## 2015-02-06 DIAGNOSIS — Z7902 Long term (current) use of antithrombotics/antiplatelets: Secondary | ICD-10-CM | POA: Diagnosis not present

## 2015-02-06 DIAGNOSIS — I671 Cerebral aneurysm, nonruptured: Secondary | ICD-10-CM

## 2015-02-06 DIAGNOSIS — I663 Occlusion and stenosis of cerebellar arteries: Secondary | ICD-10-CM | POA: Diagnosis not present

## 2015-02-06 DIAGNOSIS — I69351 Hemiplegia and hemiparesis following cerebral infarction affecting right dominant side: Secondary | ICD-10-CM | POA: Diagnosis not present

## 2015-02-06 DIAGNOSIS — I6501 Occlusion and stenosis of right vertebral artery: Secondary | ICD-10-CM | POA: Insufficient documentation

## 2015-02-06 DIAGNOSIS — J449 Chronic obstructive pulmonary disease, unspecified: Secondary | ICD-10-CM | POA: Diagnosis not present

## 2015-02-06 DIAGNOSIS — R42 Dizziness and giddiness: Secondary | ICD-10-CM | POA: Diagnosis not present

## 2015-02-06 DIAGNOSIS — F329 Major depressive disorder, single episode, unspecified: Secondary | ICD-10-CM | POA: Insufficient documentation

## 2015-02-06 LAB — PROTIME-INR
INR: 1.02 (ref 0.00–1.49)
Prothrombin Time: 13.5 seconds (ref 11.6–15.2)

## 2015-02-06 LAB — BASIC METABOLIC PANEL
Anion gap: 8 (ref 5–15)
BUN: 25 mg/dL — ABNORMAL HIGH (ref 6–20)
CO2: 27 mmol/L (ref 22–32)
Calcium: 9.3 mg/dL (ref 8.9–10.3)
Chloride: 105 mmol/L (ref 101–111)
Creatinine, Ser: 0.91 mg/dL (ref 0.44–1.00)
GFR calc Af Amer: >60 mL/min (ref >60)
GFR calc non Af Amer: >60 mL/min (ref >60)
Glucose, Bld: 100 mg/dL — ABNORMAL HIGH (ref 65–99)
Potassium: 4.4 mmol/L (ref 3.5–5.1)
Sodium: 140 mmol/L (ref 135–145)

## 2015-02-06 LAB — CBC WITH DIFFERENTIAL/PLATELET
Basophils Absolute: 0.1 10*3/uL (ref 0.0–0.1)
Basophils Relative: 1 % (ref 0–1)
Eosinophils Absolute: 0.2 10*3/uL (ref 0.0–0.7)
Eosinophils Relative: 3 % (ref 0–5)
HCT: 39.7 % (ref 36.0–46.0)
Hemoglobin: 12.8 g/dL (ref 12.0–15.0)
Lymphocytes Relative: 24 % (ref 12–46)
Lymphs Abs: 1.5 10*3/uL (ref 0.7–4.0)
MCH: 29.8 pg (ref 26.0–34.0)
MCHC: 32.2 g/dL (ref 30.0–36.0)
MCV: 92.5 fL (ref 78.0–100.0)
Monocytes Absolute: 0.7 10*3/uL (ref 0.1–1.0)
Monocytes Relative: 11 % (ref 3–12)
Neutro Abs: 3.7 10*3/uL (ref 1.7–7.7)
Neutrophils Relative %: 61 % (ref 43–77)
Platelets: 143 10*3/uL — ABNORMAL LOW (ref 150–400)
RBC: 4.29 MIL/uL (ref 3.87–5.11)
RDW: 13.4 % (ref 11.5–15.5)
WBC: 6.1 10*3/uL (ref 4.0–10.5)

## 2015-02-06 LAB — APTT: aPTT: 30 seconds (ref 24–37)

## 2015-02-06 MED ORDER — SODIUM CHLORIDE 0.9 % IV SOLN
INTRAVENOUS | Status: AC | PRN
  Administered 2015-02-06: 75 mL/h via INTRAVENOUS

## 2015-02-06 MED ORDER — SODIUM CHLORIDE 0.9 % IV SOLN: INTRAVENOUS | Status: AC

## 2015-02-06 MED ORDER — HEPARIN SODIUM (PORCINE) 1000 UNIT/ML IJ SOLN
INTRAMUSCULAR | Status: AC | PRN
  Administered 2015-02-06: 1000 [IU] via INTRAVENOUS

## 2015-02-06 MED ORDER — FENTANYL CITRATE (PF) 100 MCG/2ML IJ SOLN
INTRAMUSCULAR | Status: AC
  Filled 2015-02-06: qty 2

## 2015-02-06 MED ORDER — SODIUM CHLORIDE 0.9 % IV SOLN
INTRAVENOUS | Status: DC
  Administered 2015-02-06: 08:00:00 via INTRAVENOUS

## 2015-02-06 MED ORDER — IOHEXOL 300 MG/ML  SOLN
200.0000 mL | Freq: Once | INTRAMUSCULAR | Status: AC | PRN
  Administered 2015-02-06: 80 mL via INTRAVENOUS

## 2015-02-06 MED ORDER — HEPARIN SOD (PORK) LOCK FLUSH 100 UNIT/ML IV SOLN
INTRAVENOUS | Status: AC
  Filled 2015-02-06: qty 20

## 2015-02-06 MED ORDER — LIDOCAINE HCL 1 % IJ SOLN
INTRAMUSCULAR | Status: AC
  Filled 2015-02-06: qty 20

## 2015-02-06 MED ORDER — MIDAZOLAM HCL 2 MG/2ML IJ SOLN
INTRAMUSCULAR | Status: AC | PRN
  Administered 2015-02-06: 1 mg via INTRAVENOUS

## 2015-02-06 MED ORDER — MIDAZOLAM HCL 2 MG/2ML IJ SOLN
INTRAMUSCULAR | Status: AC
  Filled 2015-02-06: qty 2

## 2015-02-06 MED ORDER — FENTANYL CITRATE (PF) 100 MCG/2ML IJ SOLN
INTRAMUSCULAR | Status: AC | PRN
  Administered 2015-02-06: 25 ug via INTRAVENOUS

## 2015-02-06 MED ORDER — HYDRALAZINE HCL 20 MG/ML IJ SOLN
INTRAMUSCULAR | Status: AC
  Filled 2015-02-06: qty 1

## 2015-02-06 MED ORDER — HYDRALAZINE HCL 20 MG/ML IJ SOLN
INTRAMUSCULAR | Status: AC | PRN
  Administered 2015-02-06: 5 mg via INTRAVENOUS

## 2015-02-06 NOTE — Discharge Instructions (Signed)

## 2015-02-06 NOTE — H&P (Signed)
Chief Complaint: Cerebral artery stenosis Headaches   Referring Physician(s): Dr Denton MeekJ Gutierrez  History of Present Illness: [name] McgregorHattie S [name] is a 69 y.o. female  Known to NIR L Internal carotid artery angioplasty 10/2008 Most recent follow up angio 07/2013 reveals R Middle cerebral artery stenosis; R common carotid artery stenosis; L common carotid artery stenosis and mild L internal carotid artery stenosis; small L ICA saccular aneurysm Pt asymptomatic and on Plavix and ASA No intervention needed  Pt has had daily headache top of head x 2 months Denies N/V or visual changes Taking Tylenol which does help Does say she has had eye examination and did have "film" over implants in eyes Was examined in ED 12/2014 - MRI and CT head all neg Also complains that she feels gait is slightly unsteady Denies falls  Pt scheduled for follow up cerebral arteriogram  Past Medical History  Diagnosis Date  . Hypertension   . Stroke 2010    x3 with residual R hemiparesis, s/p R MCA balloon angioplasty (2010)  . Peripheral vascular disease   . COPD (chronic obstructive pulmonary disease) 12/2012    spirometry: Pre: FVC 84%, FEV1 69%, ratio 0.64 consistent with moderate obstruction.  . Hyperlipidemia   . Osteoarthritis     h/o ruptured disc s/p ESI  . Depression   . Smoker     quit 10/2012  . History of chicken pox   . Carotid stenosis     R 50% (12/2012)  . Lower back pain     h/o HNP s/p surgery  . Osteoporosis 11/2010    DEXA -2.7 spine, thoracic compression fracture  . Aneurysm     Past Surgical History  Procedure Laterality Date  . Cholecystectomy  1970's  . Appendectomy  1960's  . Cesarean section    . Cataract extraction      bilateral  . L hip surgery  2006    fractured - screws placed  . Dexa  11/2010    T -2.7 spine, -1.9 hip    Allergies: Doxycycline  Medications: Prior to Admission medications   Medication Sig Start Date End Date Taking? Authorizing Provider    alendronate (FOSAMAX) 70 MG tablet TAKE 1 TABLET WEEKLY. TAKE WITH A FULL GLASS OF WATER ON AN EMPTY STOMACH. 12/12/14  Yes Eustaquio BoydenJavier Gutierrez, MD  aspirin EC 81 MG tablet Take 81 mg by mouth daily.   Yes Historical Provider, MD  atorvastatin (LIPITOR) 40 MG tablet TAKE 1 TABLET EVERY DAY 01/01/15  Yes Eustaquio BoydenJavier Gutierrez, MD  benazepril (LOTENSIN) 10 MG tablet Take 1 tablet (10 mg total) by mouth daily. 01/02/15  Yes Eustaquio BoydenJavier Gutierrez, MD  Calcium Carb-Cholecalciferol (CALCIUM-VITAMIN D) 600-400 MG-UNIT TABS Take 1 tablet by mouth daily.   Yes Historical Provider, MD  clopidogrel (PLAVIX) 75 MG tablet TAKE 1 TABLET EVERY DAY 01/01/15  Yes Eustaquio BoydenJavier Gutierrez, MD  fexofenadine (ALLEGRA) 180 MG tablet Take 180 mg by mouth daily.   Yes Historical Provider, MD  sertraline (ZOLOFT) 100 MG tablet Take 1 tablet (100 mg total) by mouth daily. 01/29/15  Yes Eustaquio BoydenJavier Gutierrez, MD  traMADol (ULTRAM) 50 MG tablet Take 1 tablet (50 mg total) by mouth 2 (two) times daily as needed. 01/02/15  Yes Eustaquio BoydenJavier Gutierrez, MD  traZODone (DESYREL) 100 MG tablet TAKE 1 TABLET AT BEDTIME AS NEEDED FOR SLEEP 10/03/14  Yes Eustaquio BoydenJavier Gutierrez, MD  zoster vaccine live, PF, (ZOSTAVAX) [id number] UNT/0.65ML injection Inject 19,400 Units into the skin once. 01/29/15  Yes Eustaquio BoydenJavier Gutierrez, MD  albuterol (PROVENTIL HFA;VENTOLIN HFA) 108 (90 BASE) MCG/ACT inhaler Inhale 2 puffs into the lungs every 6 (six) hours as needed for wheezing or shortness of breath.     Historical Provider, MD  HYDROcodone-acetaminophen (NORCO) 5-325 MG per tablet Take 1-2 tablets by mouth every 6 (six) hours as needed. Patient not taking: Reported on 02/02/2015 01/06/15   Geoffery Lyons, MD     Family History  Problem Relation Age of Onset  . CAD Mother     MI  . CAD Maternal Aunt     MI  . CAD Sister     MI  . Sudden death Father 81    died in his sleep, chain smoker  . Cirrhosis Father   . Cancer Mother     cervical  . Hypertension Daughter   . Diabetes Maternal Aunt   .  Cancer Maternal Grandmother     cervical    History   Social History  . Marital Status: Divorced    Spouse Name: N/A  . Number of Children: N/A  . Years of Education: N/A   Social History Main Topics  . Smoking status: Former Smoker -- 1.00 packs/day for 45 years    Types: Cigarettes  . Smokeless tobacco: Never Used  . Alcohol Use: No  . Drug Use: No  . Sexual Activity: Not Currently   Other Topics Concern  . None   Social History Narrative   Lives with granddaughter and great grandchildren   Occupation: retired, was Lawyer   Edu: 8th grade   Activity: no regular activity:   Diet: some water, fruits/vegetables daily     Review of Systems: A 12 point ROS discussed and pertinent positives are indicated in the HPI above.  All other systems are negative.  Review of Systems  Constitutional: Negative for fever, activity change, appetite change and unexpected weight change.  Eyes: Positive for visual disturbance. Negative for photophobia and redness.  Respiratory: Negative for shortness of breath.   Cardiovascular: Negative for chest pain.  Gastrointestinal: Positive for abdominal pain.  Musculoskeletal: Negative for back pain.  Neurological: Positive for weakness and headaches. Negative for dizziness, tremors, seizures, syncope, facial asymmetry, speech difficulty, light-headedness and numbness.  Psychiatric/Behavioral: Negative for behavioral problems and confusion.    Vital Signs: BP 142/60 mmHg  Pulse 75  Temp(Src) 97.9 F (36.6 C) (Oral)  Resp 18  Ht 5' (1.524 m)  Wt 77.111 kg (170 lb)  BMI 33.20 kg/m2  SpO2 97%  Physical Exam  Constitutional: She is oriented to person, place, and time. She appears well-nourished.  Eyes: EOM are normal.  Neck: Neck supple.  Cardiovascular: Normal rate, regular rhythm and normal heart sounds.   No murmur heard. Pulmonary/Chest: Effort normal and breath sounds normal. She has no wheezes.  Abdominal: Soft. Bowel sounds are  normal. There is no tenderness.  Musculoskeletal: Normal range of motion.  Neurological: She is alert and oriented to person, place, and time.  Skin: Skin is warm and dry.  Psychiatric: She has a normal mood and affect. Her behavior is normal. Judgment and thought content normal.  Nursing note and vitals reviewed.   Mallampati Score:  MD Evaluation Airway: WNL Heart: WNL Abdomen: WNL Chest/ Lungs: WNL ASA  Classification: 3 Mallampati/Airway Score: One  Imaging: No results found.  Labs:  CBC:  Recent Labs  12/29/14 1807 12/29/14 1821 01/06/15 1222 02/06/15 0510  WBC 7.3  --  7.2 6.1  HGB 13.2 14.6 13.3 12.8  HCT 40.8 43.0 41.4 39.7  PLT 154  --  148* 143*    COAGS: No results for input(s): INR, APTT in the last 8760 hours.  BMP:  Recent Labs  03/18/14 1110 12/29/14 1821 01/06/15 1222  NA  --  139 139  K  --  4.6 4.3  CL  --  105 103  CO2  --   --  27  GLUCOSE  --  102* 86  BUN  --  31* 19  CALCIUM  --   --  9.8  CREATININE 0.89 1.10 1.05  GFRNONAA 66*  --  53*  GFRAA 76*  --  62*    LIVER FUNCTION TESTS:  Recent Labs  01/06/15 1222  BILITOT 0.5  AST 26  ALT 27  ALKPHOS 74  PROT 7.1  ALBUMIN 3.9    TUMOR MARKERS: No results for input(s): AFPTM, CEA, CA199, CHROMGRNA in the last 8760 hours.  Assessment and Plan:  Hx L ICA angioplasty 10/2008 RMCA; RCCA; LCCA;LICA stenosis Small saccular aneurysm L ICA--seen angio 07/2013 Complains of symptoms of headaches and mild gait unsteadiness Scheduled for cerebral arteriogram Risks and Benefits discussed with the patient including, but not limited to bleeding, infection, vascular injury, contrast induced renal failure, stroke or even death. All of the patient's questions were answered, patient is agreeable to proceed. Consent signed and in chart.  Thank you for this interesting consult.  I greatly enjoyed meeting [name] [name] and look forward to participating in their  care.  Signed: Cheyne Bungert A 02/06/2015, 8:34 AM   I spent a total of  20 Minutes   in face to face in clinical consultation, greater than 50% of which was counseling/coordinating care for cerebral arteriogram

## 2015-02-06 NOTE — Procedures (Signed)
S/P 4 vessle cerebrakl arteriograms. Rt CFa approach. Findings. 1.Stable 70 to 75% stenosis of RT MCA prox 2.approx 50 % stenosis of RT CCA . 3.Approx 6.704mm x 3.3 mm RT ICA pet cav aneurysm 4.aprrox 50 to 60 % stenosis of RT VA origin 5.. 50 % stenosis of prox Lt CCA and 70 % stenosis of LT CCA distally. 6.Approx 70 % stenosis of LT ICA cav seg. .[name]

## 2015-02-09 ENCOUNTER — Other Ambulatory Visit: Payer: Self-pay | Admitting: Family Medicine

## 2015-02-09 NOTE — Telephone Encounter (Signed)
plz phone in. 

## 2015-02-09 NOTE — Telephone Encounter (Signed)
Requesting Tramadol 50mg -Take 1 tablet by mouth twice daily as needed. Last refill:01/02/15-#40,0 Last OV:01/29/15 Please advise.//AB/CMA

## 2015-02-10 ENCOUNTER — Other Ambulatory Visit: Payer: Self-pay | Admitting: Family Medicine

## 2015-02-10 NOTE — Telephone Encounter (Signed)
Rx called to pharmacy as instructed. 

## 2015-03-18 ENCOUNTER — Telehealth: Payer: Self-pay | Admitting: Family Medicine

## 2015-03-18 NOTE — Telephone Encounter (Signed)
LVM for [name] [name] to call back with more details

## 2015-03-18 NOTE — Telephone Encounter (Signed)
[name] [name] has been trying to get a hold of Dr [name] [name] to make an appointment with him to discuss test results.  She cannot get anyone to call her back  Can you call to get an appointment for her.  She said she had to do this the last time she made an appointment

## 2015-03-18 NOTE — Telephone Encounter (Signed)
Spoke to [place]. Her mom needs an appt with Dr. Kerby Nora in interventional radiology.  I sent Victorino Dike at Dr. Fatima Sanger office a staff message to make her aware that [name] had called several times and if she could look into patients case.  LVM for Tonya that I have sent message to [facility] and if she had any additional questions she can call me back.

## 2015-03-19 ENCOUNTER — Encounter: Payer: Self-pay | Admitting: Family Medicine

## 2015-03-19 ENCOUNTER — Ambulatory Visit (INDEPENDENT_AMBULATORY_CARE_PROVIDER_SITE_OTHER): Payer: Commercial Managed Care - HMO | Admitting: Family Medicine

## 2015-03-19 VITALS — BP 140/74 | HR 72 | Temp 98.4°F | Wt 173.2 lb

## 2015-03-19 DIAGNOSIS — F331 Major depressive disorder, recurrent, moderate: Secondary | ICD-10-CM | POA: Diagnosis not present

## 2015-03-19 DIAGNOSIS — R1310 Dysphagia, unspecified: Secondary | ICD-10-CM

## 2015-03-19 NOTE — Progress Notes (Signed)
BP 140/74 mmHg  Pulse 72  Temp(Src) 98.4 F (36.9 C) (Oral)  Wt 173 lb 4 oz (78.586 kg)   CC: "i got choked twice last week"  Subjective:    Patient ID: [name] [name], female    DOB: Oct 21, 1945, 69 y.o.   MRN: [id number]  HPI: [name] [name] is a 69 y.o. female presenting on 03/19/2015 for Choking and Memory Loss   Presents with daughter today.   2 episodes of choking, needed to have heimlich done. Once to watermelon and again to chicken liver yesterday. Both times couldn't breathe, talk, cough. No trouble with liquids. No pain. No odynophagia. No recent fevers/chills, coughing or wheezing. No recent CVA sxs. She does occasionally get GERD sxs to spaghetti and spicy foods.   She does have history of stroke 2010 with residual R hemiparesis.   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 At that time, PHQ9 = 16. Daughter thinks more anxiety than depression but pt thinks more depression.  Relevant past medical, surgical, family and social history reviewed and updated as indicated. Interim medical history since our last visit reviewed. Allergies and medications reviewed and updated. Current Outpatient Prescriptions on File Prior to Visit  Medication Sig  . albuterol (PROVENTIL HFA;VENTOLIN HFA) 108 (90 BASE) MCG/ACT inhaler Inhale 2 puffs into the lungs every 6 (six) hours as needed for wheezing or shortness of breath.   [name] alendronate (FOSAMAX) 70 MG tablet TAKE 1 TABLET WEEKLY. TAKE WITH A FULL GLASS OF WATER ON AN EMPTY STOMACH.  [name] aspirin EC 81 MG tablet Take 81 mg by mouth daily.  [name] atorvastatin (LIPITOR) 40 MG tablet TAKE 1 TABLET EVERY DAY  . benazepril (LOTENSIN) 10 MG tablet Take 1 tablet (10 mg total) by mouth daily.  . Calcium Carb-Cholecalciferol (CALCIUM-VITAMIN D) 600-400 MG-UNIT TABS Take 1 tablet by mouth daily.  . clopidogrel (PLAVIX) 75 MG tablet  TAKE 1 TABLET EVERY DAY  . fexofenadine (ALLEGRA) 180 MG tablet Take 180 mg by mouth daily.  . sertraline (ZOLOFT) 100 MG tablet Take 1 tablet (100 mg total) by mouth daily.  . traMADol (ULTRAM) 50 MG tablet TAKE ONE TABLET BY MOUTH TWICE DAILY AS NEEDED  . traZODone (DESYREL) 100 MG tablet TAKE 1 TABLET AT BEDTIME AS NEEDED FOR SLEEP   No current facility-administered medications on file prior to visit.    Review of Systems Per HPI unless specifically indicated above     Objective:    BP 140/74 mmHg  Pulse 72  Temp(Src) 98.4 F (36.9 C) (Oral)  Wt 173 lb 4 oz (78.586 kg)  Wt Readings from Last 3 Encounters:  03/19/15 173 lb 4 oz (78.586 kg)  02/06/15 187 lb (84.823 kg)  01/29/15 174 lb 12 oz (79.266 kg)    Physical Exam  Constitutional: She appears well-developed and well-nourished. No distress.  HENT:  Mouth/Throat: Oropharynx is clear and moist. No oropharyngeal exudate.  Eyes: Conjunctivae and EOM are normal. Pupils are equal, round, and reactive to light. No scleral icterus.  Neck: Normal range of motion. Neck supple. No thyromegaly present.  Cardiovascular: Normal rate, regular rhythm, normal heart sounds and intact distal pulses.   No murmur heard. Pulmonary/Chest: Effort normal and breath sounds normal. No respiratory distress. She has no wheezes. She has no rales.  Abdominal: Soft. Normal appearance and bowel sounds are normal. She exhibits no distension and no mass. There is no  hepatosplenomegaly. There is no tenderness. There is no rigidity, no rebound, no guarding, no CVA tenderness and negative Murphy's sign.  Musculoskeletal: She exhibits no edema.  Lymphadenopathy:    She has no cervical adenopathy.  Skin: Skin is warm and dry. No erythema.  Psychiatric: Her speech is normal. Thought content normal. Her affect is blunt. She is slowed.  Nursing note and vitals reviewed.      Assessment & Plan:   Problem List Items Addressed This Visit    Dysphagia -  Primary    2 episodes this past week of choking on food. Difficult to make distinction between esophageal and oropharyngeal phase. Start with barium swallow to r/o anatomic esophageal issue. If unrevealing, would consider MBS. Discussed with pt and family who agree with plan. H/o stroke increases risk of oropharyngeal dysphagia. Denies new stroke sxs.      Relevant Orders   DG Esophagus   MDD (major depressive disorder), recurrent episode, moderate    Pt feels she is doing better with increased sertraline dose. Continue to monitor. PHQ9 = 16 --> 21 GAD7 = 12. Actually questionairre shows worsening depression. Pt denies this. Discussed options. Daughter thinks situational from inactivity and not enough socialization. Discussed day care for elderly. Discussed counseling. Pt will consider this but currently not interested in further treatment. Declines change in med.          Follow up plan: Return if symptoms worsen or fail to improve.

## 2015-03-19 NOTE — Progress Notes (Signed)
Pre visit review using our clinic review tool, if applicable. No additional management support is needed unless otherwise documented below in the visit note. 

## 2015-03-19 NOTE — Assessment & Plan Note (Signed)
Pt feels she is doing better with increased sertraline dose. Continue to monitor. PHQ9 = 16 --> 21 GAD7 = 12. Actually questionairre shows worsening depression. Pt denies this. Discussed options. Daughter thinks situational from inactivity and not enough socialization. Discussed day care for elderly. Discussed counseling. Pt will consider this but currently not interested in further treatment. Declines change in med.

## 2015-03-19 NOTE — Patient Instructions (Signed)
Let's start with barium swallow - looks at ability of esophagus to carry food down to stomach. If normal, we may send you to speech therapist to evaluate your ability to initiate swallowing. Let me know if any recurrent swallowing troubles in the meantime.  Good to see you today, call us with questions.

## 2015-03-19 NOTE — Assessment & Plan Note (Signed)
2 episodes this past week of choking on food. Difficult to make distinction between esophageal and oropharyngeal phase. Start with barium swallow to r/o anatomic esophageal issue. If unrevealing, would consider MBS. Discussed with pt and family who agree with plan. H/o stroke increases risk of oropharyngeal dysphagia. Denies new stroke sxs.

## 2015-03-25 ENCOUNTER — Other Ambulatory Visit (HOSPITAL_COMMUNITY): Payer: Self-pay | Admitting: Interventional Radiology

## 2015-03-25 DIAGNOSIS — I671 Cerebral aneurysm, nonruptured: Secondary | ICD-10-CM

## 2015-03-25 DIAGNOSIS — I6529 Occlusion and stenosis of unspecified carotid artery: Secondary | ICD-10-CM

## 2015-03-26 ENCOUNTER — Ambulatory Visit (HOSPITAL_COMMUNITY)
Admission: RE | Admit: 2015-03-26 | Discharge: 2015-03-26 | Disposition: A | Discharge status: Home / Self Care | Payer: Commercial Managed Care - HMO | Source: Ambulatory Visit | Attending: Interventional Radiology | Admitting: Interventional Radiology

## 2015-03-26 ENCOUNTER — Ambulatory Visit (HOSPITAL_COMMUNITY)
Admission: RE | Admit: 2015-03-26 | Discharge: 2015-03-26 | Disposition: A | Discharge status: Home / Self Care | Payer: Commercial Managed Care - HMO | Source: Ambulatory Visit | Attending: Family Medicine | Admitting: Family Medicine

## 2015-03-26 DIAGNOSIS — R1310 Dysphagia, unspecified: Secondary | ICD-10-CM

## 2015-03-26 DIAGNOSIS — K228 Other specified diseases of esophagus: Secondary | ICD-10-CM | POA: Diagnosis not present

## 2015-03-26 DIAGNOSIS — I6529 Occlusion and stenosis of unspecified carotid artery: Secondary | ICD-10-CM

## 2015-03-26 DIAGNOSIS — F328 Other depressive episodes: Secondary | ICD-10-CM | POA: Diagnosis not present

## 2015-03-26 DIAGNOSIS — I663 Occlusion and stenosis of cerebellar arteries: Secondary | ICD-10-CM | POA: Diagnosis not present

## 2015-03-26 DIAGNOSIS — I671 Cerebral aneurysm, nonruptured: Secondary | ICD-10-CM

## 2015-03-26 DIAGNOSIS — K449 Diaphragmatic hernia without obstruction or gangrene: Secondary | ICD-10-CM | POA: Insufficient documentation

## 2015-03-26 DIAGNOSIS — I6523 Occlusion and stenosis of bilateral carotid arteries: Secondary | ICD-10-CM | POA: Diagnosis not present

## 2015-03-26 DIAGNOSIS — R2681 Unsteadiness on feet: Secondary | ICD-10-CM | POA: Diagnosis not present

## 2015-04-01 ENCOUNTER — Encounter: Payer: Self-pay | Admitting: *Deleted

## 2015-05-06 ENCOUNTER — Other Ambulatory Visit: Payer: Self-pay | Admitting: Family Medicine

## 2015-07-31 ENCOUNTER — Emergency Department (HOSPITAL_COMMUNITY): Payer: Commercial Managed Care - HMO

## 2015-07-31 ENCOUNTER — Encounter (HOSPITAL_COMMUNITY): Payer: Self-pay | Admitting: Family Medicine

## 2015-07-31 ENCOUNTER — Emergency Department (HOSPITAL_COMMUNITY)
Admission: EM | Admit: 2015-07-31 | Discharge: 2015-07-31 | Disposition: A | Discharge status: Home / Self Care | Payer: Commercial Managed Care - HMO | Source: Home / Self Care | Attending: Emergency Medicine | Admitting: Emergency Medicine

## 2015-07-31 DIAGNOSIS — I69351 Hemiplegia and hemiparesis following cerebral infarction affecting right dominant side: Secondary | ICD-10-CM | POA: Diagnosis not present

## 2015-07-31 DIAGNOSIS — Z9841 Cataract extraction status, right eye: Secondary | ICD-10-CM | POA: Diagnosis not present

## 2015-07-31 DIAGNOSIS — M1990 Unspecified osteoarthritis, unspecified site: Secondary | ICD-10-CM | POA: Diagnosis present

## 2015-07-31 DIAGNOSIS — S12400A Unspecified displaced fracture of fifth cervical vertebra, initial encounter for closed fracture: Secondary | ICD-10-CM | POA: Diagnosis not present

## 2015-07-31 DIAGNOSIS — I10 Essential (primary) hypertension: Secondary | ICD-10-CM | POA: Diagnosis not present

## 2015-07-31 DIAGNOSIS — I739 Peripheral vascular disease, unspecified: Secondary | ICD-10-CM | POA: Diagnosis present

## 2015-07-31 DIAGNOSIS — G44309 Post-traumatic headache, unspecified, not intractable: Secondary | ICD-10-CM | POA: Diagnosis present

## 2015-07-31 DIAGNOSIS — W19XXXA Unspecified fall, initial encounter: Secondary | ICD-10-CM

## 2015-07-31 DIAGNOSIS — R221 Localized swelling, mass and lump, neck: Secondary | ICD-10-CM | POA: Diagnosis not present

## 2015-07-31 DIAGNOSIS — S0003XA Contusion of scalp, initial encounter: Secondary | ICD-10-CM | POA: Diagnosis not present

## 2015-07-31 DIAGNOSIS — R4182 Altered mental status, unspecified: Secondary | ICD-10-CM | POA: Diagnosis not present

## 2015-07-31 DIAGNOSIS — S3991XA Unspecified injury of abdomen, initial encounter: Secondary | ICD-10-CM | POA: Diagnosis not present

## 2015-07-31 DIAGNOSIS — Y93K1 Activity, walking an animal: Secondary | ICD-10-CM | POA: Diagnosis not present

## 2015-07-31 DIAGNOSIS — M5022 Other cervical disc displacement, mid-cervical region, unspecified level: Secondary | ICD-10-CM | POA: Diagnosis not present

## 2015-07-31 DIAGNOSIS — M4312 Spondylolisthesis, cervical region: Secondary | ICD-10-CM | POA: Diagnosis not present

## 2015-07-31 DIAGNOSIS — S060X9A Concussion with loss of consciousness of unspecified duration, initial encounter: Secondary | ICD-10-CM | POA: Diagnosis not present

## 2015-07-31 DIAGNOSIS — R51 Headache: Secondary | ICD-10-CM | POA: Diagnosis not present

## 2015-07-31 DIAGNOSIS — S0990XA Unspecified injury of head, initial encounter: Secondary | ICD-10-CM | POA: Diagnosis not present

## 2015-07-31 DIAGNOSIS — S0083XA Contusion of other part of head, initial encounter: Secondary | ICD-10-CM | POA: Diagnosis not present

## 2015-07-31 DIAGNOSIS — E785 Hyperlipidemia, unspecified: Secondary | ICD-10-CM | POA: Diagnosis not present

## 2015-07-31 DIAGNOSIS — W1830XA Fall on same level, unspecified, initial encounter: Secondary | ICD-10-CM | POA: Diagnosis present

## 2015-07-31 DIAGNOSIS — Z9842 Cataract extraction status, left eye: Secondary | ICD-10-CM | POA: Diagnosis not present

## 2015-07-31 DIAGNOSIS — S41111A Laceration without foreign body of right upper arm, initial encounter: Secondary | ICD-10-CM | POA: Diagnosis not present

## 2015-07-31 DIAGNOSIS — Z8249 Family history of ischemic heart disease and other diseases of the circulatory system: Secondary | ICD-10-CM | POA: Diagnosis not present

## 2015-07-31 DIAGNOSIS — M7989 Other specified soft tissue disorders: Secondary | ICD-10-CM | POA: Diagnosis not present

## 2015-07-31 DIAGNOSIS — J449 Chronic obstructive pulmonary disease, unspecified: Secondary | ICD-10-CM | POA: Diagnosis present

## 2015-07-31 DIAGNOSIS — R220 Localized swelling, mass and lump, head: Secondary | ICD-10-CM | POA: Diagnosis not present

## 2015-07-31 DIAGNOSIS — M7981 Nontraumatic hematoma of soft tissue: Secondary | ICD-10-CM | POA: Diagnosis not present

## 2015-07-31 DIAGNOSIS — S06899A Other specified intracranial injury with loss of consciousness of unspecified duration, initial encounter: Secondary | ICD-10-CM | POA: Diagnosis not present

## 2015-07-31 DIAGNOSIS — Z79899 Other long term (current) drug therapy: Secondary | ICD-10-CM | POA: Diagnosis not present

## 2015-07-31 DIAGNOSIS — Z7983 Long term (current) use of bisphosphonates: Secondary | ICD-10-CM | POA: Diagnosis not present

## 2015-07-31 DIAGNOSIS — Z7982 Long term (current) use of aspirin: Secondary | ICD-10-CM | POA: Diagnosis not present

## 2015-07-31 DIAGNOSIS — S298XXA Other specified injuries of thorax, initial encounter: Secondary | ICD-10-CM | POA: Diagnosis not present

## 2015-07-31 DIAGNOSIS — M50321 Other cervical disc degeneration at C4-C5 level: Secondary | ICD-10-CM | POA: Diagnosis not present

## 2015-07-31 DIAGNOSIS — R030 Elevated blood-pressure reading, without diagnosis of hypertension: Secondary | ICD-10-CM | POA: Diagnosis not present

## 2015-07-31 DIAGNOSIS — R627 Adult failure to thrive: Secondary | ICD-10-CM | POA: Diagnosis not present

## 2015-07-31 DIAGNOSIS — T148 Other injury of unspecified body region: Secondary | ICD-10-CM | POA: Diagnosis not present

## 2015-07-31 DIAGNOSIS — S14109A Unspecified injury at unspecified level of cervical spinal cord, initial encounter: Secondary | ICD-10-CM | POA: Diagnosis not present

## 2015-07-31 DIAGNOSIS — M542 Cervicalgia: Secondary | ICD-10-CM | POA: Diagnosis not present

## 2015-07-31 DIAGNOSIS — M810 Age-related osteoporosis without current pathological fracture: Secondary | ICD-10-CM | POA: Diagnosis present

## 2015-07-31 DIAGNOSIS — S12490A Other displaced fracture of fifth cervical vertebra, initial encounter for closed fracture: Secondary | ICD-10-CM | POA: Diagnosis not present

## 2015-07-31 DIAGNOSIS — D62 Acute posthemorrhagic anemia: Secondary | ICD-10-CM | POA: Diagnosis not present

## 2015-07-31 DIAGNOSIS — Z87891 Personal history of nicotine dependence: Secondary | ICD-10-CM | POA: Diagnosis not present

## 2015-07-31 DIAGNOSIS — N390 Urinary tract infection, site not specified: Secondary | ICD-10-CM | POA: Diagnosis not present

## 2015-07-31 DIAGNOSIS — Z7902 Long term (current) use of antithrombotics/antiplatelets: Secondary | ICD-10-CM | POA: Diagnosis not present

## 2015-07-31 DIAGNOSIS — S0093XA Contusion of unspecified part of head, initial encounter: Secondary | ICD-10-CM | POA: Diagnosis not present

## 2015-07-31 DIAGNOSIS — R109 Unspecified abdominal pain: Secondary | ICD-10-CM | POA: Diagnosis not present

## 2015-07-31 DIAGNOSIS — Z881 Allergy status to other antibiotic agents status: Secondary | ICD-10-CM | POA: Diagnosis not present

## 2015-07-31 DIAGNOSIS — S61412A Laceration without foreign body of left hand, initial encounter: Secondary | ICD-10-CM | POA: Diagnosis not present

## 2015-07-31 LAB — COMPREHENSIVE METABOLIC PANEL
ALT: 17 U/L (ref 14–54)
AST: 24 U/L (ref 15–41)
Albumin: 3.9 g/dL (ref 3.5–5.0)
Alkaline Phosphatase: 69 U/L (ref 38–126)
Anion gap: 10 (ref 5–15)
BUN: 20 mg/dL (ref 6–20)
CO2: 21 mmol/L — ABNORMAL LOW (ref 22–32)
Calcium: 9.2 mg/dL (ref 8.9–10.3)
Chloride: 105 mmol/L (ref 101–111)
Creatinine, Ser: 1.08 mg/dL — ABNORMAL HIGH (ref 0.44–1.00)
GFR calc Af Amer: 60 mL/min — ABNORMAL LOW (ref >60)
GFR calc non Af Amer: 52 mL/min — ABNORMAL LOW (ref >60)
Glucose, Bld: 165 mg/dL — ABNORMAL HIGH (ref 65–99)
Potassium: 4.2 mmol/L (ref 3.5–5.1)
Sodium: 136 mmol/L (ref 135–145)
Total Bilirubin: 0.5 mg/dL (ref 0.3–1.2)
Total Protein: 7.1 g/dL (ref 6.5–8.1)

## 2015-07-31 LAB — CBC WITH DIFFERENTIAL/PLATELET
Basophils Absolute: 0.1 10*3/uL (ref 0.0–0.1)
Basophils Relative: 1 %
Eosinophils Absolute: 0.2 10*3/uL (ref 0.0–0.7)
Eosinophils Relative: 2 %
HCT: 40.7 % (ref 36.0–46.0)
Hemoglobin: 13.7 g/dL (ref 12.0–15.0)
Lymphocytes Relative: 21 %
Lymphs Abs: 2.1 10*3/uL (ref 0.7–4.0)
MCH: 30.5 pg (ref 26.0–34.0)
MCHC: 33.7 g/dL (ref 30.0–36.0)
MCV: 90.6 fL (ref 78.0–100.0)
Monocytes Absolute: 0.7 10*3/uL (ref 0.1–1.0)
Monocytes Relative: 7 %
Neutro Abs: 6.8 10*3/uL (ref 1.7–7.7)
Neutrophils Relative %: 69 %
Platelets: 154 10*3/uL (ref 150–400)
RBC: 4.49 MIL/uL (ref 3.87–5.11)
RDW: 13.1 % (ref 11.5–15.5)
WBC: 9.8 10*3/uL (ref 4.0–10.5)

## 2015-07-31 LAB — I-STAT TROPONIN, ED: Troponin i, poc: 0 ng/mL (ref 0.00–0.08)

## 2015-07-31 MED ORDER — FENTANYL CITRATE (PF) 100 MCG/2ML IJ SOLN
50.0000 ug | Freq: Once | INTRAMUSCULAR | Status: AC
  Administered 2015-07-31: 50 ug via INTRAVENOUS
  Filled 2015-07-31: qty 2

## 2015-07-31 MED ORDER — SODIUM CHLORIDE 0.9 % IV BOLUS (SEPSIS)
500.0000 mL | Freq: Once | INTRAVENOUS | Status: AC
  Administered 2015-07-31: 500 mL via INTRAVENOUS

## 2015-07-31 MED ORDER — ONDANSETRON HCL 4 MG/2ML IJ SOLN
INTRAMUSCULAR | Status: AC
Start: 2015-07-31 — End: 2015-07-31
  Administered 2015-07-31: 4 mg
  Filled 2015-07-31: qty 2

## 2015-07-31 MED ORDER — ONDANSETRON HCL 4 MG/2ML IJ SOLN
4.0000 mg | Freq: Once | INTRAMUSCULAR | Status: AC
  Administered 2015-07-31: 4 mg via INTRAVENOUS
  Filled 2015-07-31: qty 2

## 2015-07-31 MED ORDER — HYDROCODONE-ACETAMINOPHEN 5-325 MG PO TABS
1.0000 | ORAL_TABLET | Freq: Once | ORAL | Status: AC
  Administered 2015-07-31: 1 via ORAL
  Filled 2015-07-31: qty 1

## 2015-07-31 MED ORDER — OXYCODONE-ACETAMINOPHEN 5-325 MG PO TABS: 1.0000 | ORAL_TABLET | ORAL | Status: DC | PRN

## 2015-07-31 NOTE — ED Notes (Signed)
Pt and family aware of urine sample needed, pt unable to go at this time. 

## 2015-07-31 NOTE — ED Notes (Signed)
Patient transported to X-ray 

## 2015-07-31 NOTE — ED Provider Notes (Signed)
CSN: [id number]     Arrival date & time 07/31/15  6222 History   First MD Initiated Contact with Patient 07/31/15 [phone]     Chief Complaint  Patient presents with  . Fall    HPI   MICHAELANN GUNNOE is a 69 yo F PMH HTN, CVA x 3, brain aneurysm pw face, neck, arm pain s/p fall this morning. She states she landed on her face, feels that this is a mechanical fall because she is usually unstable on her feet. Ms. Wenner called her daughter around 9:15 am stating she fell and she is unsure of how long she was down. She denies AMS, dizziness, CP, SOB, abdominal pain, N/V/D, hematochezia, loss of bowel/bladder control. She takes Plavix.   Past Medical History  Diagnosis Date  . Hypertension   . Stroke [facility]) 2010    x3 with residual R hemiparesis, s/p R MCA balloon angioplasty (2010)  . Peripheral vascular disease ([place])   . COPD (chronic obstructive pulmonary disease) ([place]) 12/2012    spirometry: Pre: FVC 84%, FEV1 69%, ratio 0.64 consistent with moderate obstruction.  . Hyperlipidemia   . Osteoarthritis     h/o ruptured disc s/p ESI  . Depression   . Smoker     quit 10/2012  . History of chicken pox   . Carotid stenosis     R 50% (12/2012)  . Lower back pain     h/o HNP s/p surgery  . Osteoporosis 11/2010    DEXA -2.7 spine, thoracic compression fracture  . Aneurysm [facility])    Past Surgical History  Procedure Laterality Date  . Cholecystectomy  1970's  . Appendectomy  1960's  . Cesarean section    . Cataract extraction      bilateral  . L hip surgery  2006    fractured - screws placed  . Dexa  11/2010    T -2.7 spine, -1.9 hip   Family History  Problem Relation Age of Onset  . CAD Mother     MI  . CAD Maternal Aunt     MI  . CAD Sister     MI  . Sudden death Father 52    died in his sleep, chain smoker  . Cirrhosis Father   . Cancer Mother     cervical  . Hypertension Daughter   . Diabetes Maternal Aunt   . Cancer Maternal Grandmother     cervical   Social History    Substance Use Topics  . Smoking status: Former Smoker -- 1.00 packs/day for 45 years    Types: Cigarettes  . Smokeless tobacco: Never Used  . Alcohol Use: No   OB History    No data available     Review of Systems  Ten systems are reviewed and are negative for acute change except as noted in the HPI   Allergies  Doxycycline  Home Medications   Prior to Admission medications   Medication Sig Start Date End Date Taking? Authorizing Provider  albuterol (PROVENTIL HFA;VENTOLIN HFA) 108 (90 BASE) MCG/ACT inhaler Inhale 2 puffs into the lungs every 6 (six) hours as needed for wheezing or shortness of breath.     Historical Provider, MD  alendronate (FOSAMAX) 70 MG tablet TAKE 1 TABLET WEEKLY. TAKE WITH A FULL GLASS OF WATER ON AN EMPTY STOMACH. 12/12/14   Ria Bush, MD  aspirin EC 81 MG tablet Take 81 mg by mouth daily.    Historical Provider, MD  atorvastatin (LIPITOR) 40 MG tablet  TAKE 1 TABLET EVERY DAY 05/06/15   Ria Bush, MD  benazepril (LOTENSIN) 10 MG tablet Take 1 tablet (10 mg total) by mouth daily. 01/02/15   Ria Bush, MD  Calcium Carb-Cholecalciferol (CALCIUM-VITAMIN D) 600-400 MG-UNIT TABS Take 1 tablet by mouth daily.    Historical Provider, MD  clopidogrel (PLAVIX) 75 MG tablet TAKE 1 TABLET EVERY DAY 05/06/15   Ria Bush, MD  fexofenadine (ALLEGRA) 180 MG tablet Take 180 mg by mouth daily.    Historical Provider, MD  sertraline (ZOLOFT) 100 MG tablet Take 1 tablet (100 mg total) by mouth daily. 01/29/15   Ria Bush, MD  traMADol (ULTRAM) 50 MG tablet TAKE ONE TABLET BY MOUTH TWICE DAILY AS NEEDED 02/09/15   Ria Bush, MD  traZODone (DESYREL) 100 MG tablet TAKE 1 TABLET AT BEDTIME AS NEEDED FOR SLEEP 10/03/14   Ria Bush, MD   ED Triage Vitals  Enc Vitals Group     BP 07/31/15 1015 158/58 mmHg     Pulse Rate 07/31/15 1015 73     Resp 07/31/15 1101 18     Temp --      Temp src --      SpO2 07/31/15 1015 91 %     Weight --       Height --      Head Cir --      Peak Flow --      Pain Score --      Pain Loc --      Pain Edu? --      Excl. in [place]? --     Physical Exam  Constitutional: She is oriented to person, place, and time. She appears well-developed and well-nourished. No distress.  HENT:  Head: Normocephalic.  Right Ear: External ear normal.  Left Ear: External ear normal.  Nose: Nose normal.  Mouth/Throat: Oropharynx is clear and moist. No oropharyngeal exudate.  Hematoma and ecchymosis right periorbital area.   Eyes: Conjunctivae are normal. Pupils are equal, round, and reactive to light. Right eye exhibits no discharge. Left eye exhibits no discharge. No scleral icterus.  Neck: No tracheal deviation present.  Neck tenderness around C4- patient unsure if more midline or lateral.   Cardiovascular: Normal rate, regular rhythm, normal heart sounds and intact distal pulses.  Exam reveals no gallop and no friction rub.   No murmur heard. Pulmonary/Chest: Effort normal and breath sounds normal. No respiratory distress. She has no wheezes. She has no rales. She exhibits no tenderness.  Abdominal: Soft. Bowel sounds are normal. She exhibits no distension and no mass. There is no tenderness. There is no rebound and no guarding.  Musculoskeletal: Normal range of motion. She exhibits no edema or tenderness.  Lymphadenopathy:    She has no cervical adenopathy.  Neurological: She is alert and oriented to person, place, and time. No cranial nerve deficit. Coordination normal.  Skin: Skin is warm and dry. No rash noted. She is not diaphoretic. No erythema.  Skin tearing at right upper extremity and left hand (posterior)  Psychiatric: She has a normal mood and affect. Her behavior is normal.  Nursing note and vitals reviewed.   ED Course  Procedures (including critical care time) Labs Review Lab Results       I-Stat Troponin, ED (not at [facility]) (Final result) Component (Lab Inquiry)    Collection Time Result  Time Troponin i, poc Comment 3   07/31/15 10:27:00 07/31/15 10:36:34 0.00      (Comment)    Due  to the release kinetics of cTnI,  a negative result within the first hours  of the onset of symptoms does not rule out  myocardial infarction with certainty.  If myocardial infarction is still suspected,  repeat the test at appropriate intervals.                   CBC with Differential (Final result) Component (Lab Inquiry)    Collection Time Result Time WBC RBC HGB HCT MCV   07/31/15 10:13:00 07/31/15 10:50:49 9.8 4.49 13.7 40.7 90.6      Collection Time Result Time MCH MCHC RDW PLT NEUTRO PCT   07/31/15 10:13:00 07/31/15 10:50:49 30.5 33.7 13.1 154 69      Collection Time Result Time AB NEUTRO LYMPHO PCT AB LYM MONO PCT MONO ABS   07/31/15 10:13:00 07/31/15 10:50:49 6.8 21 2.1 7 0.7      Collection Time Result Time EOS PCT EOSINO ABS BASOS PCT BASOS ABS   07/31/15 10:13:00 07/31/15 10:50:49 2 0.2 1 0.1             Comprehensive metabolic panel (Final result)Abnormal Component (Lab Inquiry)    Collection Time Result Time NA K CL CO2 GLUCOSE   07/31/15 10:13:00 07/31/15 11:04:42 136 4.2 HEMOLYSIS AT THIS LEVEL MAY AFFECT RESULT 105 21 (L) 165 (H)      Collection Time Result Time BUN Creatinine, Ser CALCIUM PROTEIN Albumin   07/31/15 10:13:00 07/31/15 11:04:42 20 1.08 (H) 9.2 7.1 3.9      Collection Time Result Time AST ALT ALK PHOS BILI TOTL GFR calc non Af Amer   07/31/15 10:13:00 07/31/15 11:04:42 24 17 69 0.5 52 (L)      Collection Time Result Time GFR calc Af Amer Anion gap   07/31/15 10:13:00 07/31/15 11:04:42  60 (L)    (Comment)    (NOTE)  The eGFR has been calculated using the CKD EPI equation.  This calculation has not been validated in all clinical situations.  eGFR's persistently <60 mL/min signify possible Chronic Kidney  Disease.       10      Imaging Review CT Head Wo Contrast (Edited Result -  FINAL) Result time: 07/31/15 11:50:02  Addendum 1 of 1 by Rad Results In Interface (07/31/15 11:50:02)  ADDENDUM REPORT: 07/31/2015 11:50 ADDENDUM: Upon further review, there is noted a mildly displaced small fracture involving the anterior and inferior corner of C5 vertebral body, which may be acute or chronic. Critical Value/emergent results were called by telephone at the time of interpretation on 07/31/2015 at 11:49 am to [facility] , who verbally acknowledged these results. Electronically Signed  By: Marijo Conception, M.D.  On: 07/31/2015 11:50    Final result by Rad Results In Interface (07/31/15 11:07:44)  Narrative:  CLINICAL DATA: Posterior neck pain, right eye swelling and bruising and posttraumatic headache after fall walking dog this morning. Probable loss of consciousness.  EXAM: CT HEAD WITHOUT CONTRAST  CT MAXILLOFACIAL WITHOUT CONTRAST  CT CERVICAL SPINE WITHOUT CONTRAST  TECHNIQUE: Multidetector CT imaging of the head, cervical spine, and maxillofacial structures were performed using the standard protocol without intravenous contrast. Multiplanar CT image reconstructions of the cervical spine and maxillofacial structures were also generated.  COMPARISON: CT scan of head of December 30, 2014.  FINDINGS: CT HEAD FINDINGS  Bony calvarium appears intact. Large right frontal scalp hematoma is noted. Moderate diffuse cortical atrophy is noted. Mild chronic ischemic white matter disease is noted. No mass effect or midline shift is  noted. Ventricular size is within normal limits. There is no evidence of mass lesion, hemorrhage or acute infarction.  CT MAXILLOFACIAL FINDINGS  No fracture or other bony abnormality is noted. Paranasal sinuses appear normal. Pterygoid plates appear normal. Soft tissue hematoma is seen overlying the right orbit which extends into right maxillary soft tissues. Globes and posterior orbits appear normal.  CT CERVICAL SPINE  FINDINGS  No fracture or spondylolisthesis is noted. Moderate degenerative disc disease is noted at C5-6 and C6-7 with anterior and posterior osteophyte formation. Posterior facet joints appear normal. Visualized lung apices appear normal.  IMPRESSION: Large soft tissue hematoma that extends from the right frontal scalp region, over the right orbital region and into right maxillary soft tissues anteriorly.  Moderate diffuse cortical atrophy is noted. Mild chronic ischemic white matter disease is noted. No acute intracranial abnormality seen.  No evidence of fracture or other bony abnormality seen involving the maxillofacial region.  Moderate degenerative disc disease is noted at C5-6 and C6-7. No acute abnormality seen in the cervical spine.   Electronically Signed By: Marijo Conception, M.D. On: 07/31/2015 11:07  I have personally reviewed and evaluated these images and lab results as part of my medical decision-making.  MDM   Final diagnoses:  Fall   Due to possible syncopal event, will work-up for syncope and trauma.  Significant ecchymosis and edema right orbit. RUE skin tears. Cervical tenderness- pt unsure if midline or lateral. Patient denies pain elsewhere. Neuro exam unremarkable.   C-collar.   Ordered CT head, c-spine, CT face.  Family concerned about possible right arm fracture. Dr. Venora Maples saw patient as well, and feels additional imaging is not needed at this time.  Spoke with Dr. Nyoka Cowden regarding cortical defect at C5 on sagittal view. He states there is a fracture at anterior aspect of C5, unsure if acute vs. Chronic.   Will discuss with Dr. Venora Maples regarding plan. Dr. Venora Maples advised ambulating patient and patient following up with neuro as OP for C5 fracture.   Patient placed in [place] collar.   Irrigated skin tears, applied bacitracin ointment and steri-strips, then gauze.  Ambulated patient. She has no gait changes from baseline. Gave percocet for home.  Discussed reasons for return with family and patient. Advised neuro and PCP follow-up. Patient can be safely discharged home.       Porum Lions, PA-C 08/03/15 [place], MD 08/06/15 [phone]

## 2015-07-31 NOTE — ED Notes (Signed)
PA at bedside cleaning skin tears and steri stripping wound.

## 2015-07-31 NOTE — ED Notes (Signed)
Patient transported to CT 

## 2015-07-31 NOTE — Discharge Instructions (Signed)
[name] [name],  Nice meeting you! You have a fracture on your 5th cervical vertebrae. Please follow-up with neurosurgery regarding this. Call and make an appointment when you leave (information is attached for [facility]). Please follow-up with your primary care provider within one week. Return to the emergency department if you develop fevers, wound drainage, worsening pain, loss of bowel/bladder control. Take care.   [name] KickS. Nicole Kemani Heidel, PA-C   Fall Prevention in Hospitals, Adult As a hospital patient, your condition and the treatments you receive can increase your risk for falls. Some additional risk factors for falls in a hospital include:  Being in an unfamiliar environment.  Being on bed rest.  Your surgery.  Taking certain medicines.  Your tubing requirements, such as intravenous (IV) therapy or catheters. It is important that you learn how to decrease fall risks while at the hospital. Below are important tips that can help prevent falls. SAFETY TIPS FOR PREVENTING FALLS Talk about your risk of falling.  Ask your health care provider why you are at risk for falling. Is it your medicine, illness, tubing placement, or something else?  Make a plan with your health care provider to keep you safe from falls.  Ask your health care provider or pharmacist about side effects of your medicines. Some medicines can make you dizzy or affect your coordination. Ask for help.  Ask for help before getting out of bed. You may need to press your call button.  Ask for assistance in getting safely to the toilet.  Ask for a walker or cane to be put at your bedside. Ask that most of the side rails on your bed be placed up before your health care provider leaves the room.  Ask family or friends to sit with you.  Ask for things that are out of your reach, such as your glasses, hearing aids, telephone, bedside table, or call button. Follow these tips to avoid falling:  Stay lying or seated,  rather than standing, while waiting for help.  Wear rubber-soled slippers or shoes whenever you walk in the hospital.  Avoid quick, sudden movements.  Change positions slowly.  Sit on the side of your bed before standing.  Stand up slowly and wait before you start to walk.  Let your health care provider know if there is a spill on the floor.  Pay careful attention to the medical equipment, electrical cords, and tubes around you.  When you need help, use your call button by your bed or in the bathroom. Wait for one of your health care providers to help you.  If you feel dizzy or unsure of your footing, return to bed and wait for assistance.  Avoid being distracted by the TV, telephone, or another person in your room.  Do not lean or support yourself on rolling objects, such as IV poles or bedside tables.   This information is not intended to replace advice given to you by your health care provider. Make sure you discuss any questions you have with your health care provider.   Document Released: 09/09/2000 Document Revised: 10/03/2014 Document Reviewed: 05/20/2012 Elsevier Interactive Patient Education [employer]2016 [employer]

## 2015-07-31 NOTE — ED Notes (Signed)
Pt ambulated in the hall with assistance, denied dizziness.

## 2015-07-31 NOTE — ED Notes (Signed)
Pt here for fall. sts this morning she was letting her dog out and she pulled the chair to help hold her up and she fell. Pt struck face on the concrete. Pt sig swelling to right eye and skin tears on right arm.

## 2015-08-01 ENCOUNTER — Inpatient Hospital Stay (HOSPITAL_COMMUNITY): Payer: Commercial Managed Care - HMO

## 2015-08-01 ENCOUNTER — Encounter (HOSPITAL_COMMUNITY): Payer: Self-pay

## 2015-08-01 ENCOUNTER — Emergency Department (HOSPITAL_COMMUNITY): Payer: Commercial Managed Care - HMO

## 2015-08-01 ENCOUNTER — Inpatient Hospital Stay (HOSPITAL_COMMUNITY)
Admission: EM | Admit: 2015-08-01 | Discharge: 2015-08-04 | DRG: 089 | Disposition: A | Discharge status: Home Health | Payer: Commercial Managed Care - HMO | Attending: Orthopedic Surgery | Admitting: Orthopedic Surgery

## 2015-08-01 DIAGNOSIS — Z881 Allergy status to other antibiotic agents status: Secondary | ICD-10-CM

## 2015-08-01 DIAGNOSIS — Z7902 Long term (current) use of antithrombotics/antiplatelets: Secondary | ICD-10-CM | POA: Diagnosis not present

## 2015-08-01 DIAGNOSIS — E785 Hyperlipidemia, unspecified: Secondary | ICD-10-CM | POA: Diagnosis present

## 2015-08-01 DIAGNOSIS — D62 Acute posthemorrhagic anemia: Secondary | ICD-10-CM | POA: Diagnosis not present

## 2015-08-01 DIAGNOSIS — I69351 Hemiplegia and hemiparesis following cerebral infarction affecting right dominant side: Secondary | ICD-10-CM | POA: Diagnosis not present

## 2015-08-01 DIAGNOSIS — S12490A Other displaced fracture of fifth cervical vertebra, initial encounter for closed fracture: Secondary | ICD-10-CM | POA: Diagnosis not present

## 2015-08-01 DIAGNOSIS — T148 Other injury of unspecified body region: Secondary | ICD-10-CM | POA: Diagnosis not present

## 2015-08-01 DIAGNOSIS — R220 Localized swelling, mass and lump, head: Secondary | ICD-10-CM | POA: Diagnosis not present

## 2015-08-01 DIAGNOSIS — Z8249 Family history of ischemic heart disease and other diseases of the circulatory system: Secondary | ICD-10-CM | POA: Diagnosis not present

## 2015-08-01 DIAGNOSIS — Z9841 Cataract extraction status, right eye: Secondary | ICD-10-CM

## 2015-08-01 DIAGNOSIS — M810 Age-related osteoporosis without current pathological fracture: Secondary | ICD-10-CM | POA: Diagnosis present

## 2015-08-01 DIAGNOSIS — S0083XA Contusion of other part of head, initial encounter: Secondary | ICD-10-CM | POA: Diagnosis not present

## 2015-08-01 DIAGNOSIS — M1990 Unspecified osteoarthritis, unspecified site: Secondary | ICD-10-CM | POA: Diagnosis not present

## 2015-08-01 DIAGNOSIS — S060XAA Concussion with loss of consciousness status unknown, initial encounter: Secondary | ICD-10-CM | POA: Diagnosis present

## 2015-08-01 DIAGNOSIS — S0093XA Contusion of unspecified part of head, initial encounter: Secondary | ICD-10-CM | POA: Diagnosis not present

## 2015-08-01 DIAGNOSIS — M5022 Other cervical disc displacement, mid-cervical region, unspecified level: Secondary | ICD-10-CM | POA: Diagnosis not present

## 2015-08-01 DIAGNOSIS — M542 Cervicalgia: Secondary | ICD-10-CM | POA: Diagnosis not present

## 2015-08-01 DIAGNOSIS — N390 Urinary tract infection, site not specified: Secondary | ICD-10-CM | POA: Diagnosis not present

## 2015-08-01 DIAGNOSIS — R51 Headache: Secondary | ICD-10-CM | POA: Diagnosis not present

## 2015-08-01 DIAGNOSIS — S3991XA Unspecified injury of abdomen, initial encounter: Secondary | ICD-10-CM | POA: Diagnosis not present

## 2015-08-01 DIAGNOSIS — R030 Elevated blood-pressure reading, without diagnosis of hypertension: Secondary | ICD-10-CM | POA: Diagnosis not present

## 2015-08-01 DIAGNOSIS — Z7982 Long term (current) use of aspirin: Secondary | ICD-10-CM | POA: Diagnosis not present

## 2015-08-01 DIAGNOSIS — R221 Localized swelling, mass and lump, neck: Secondary | ICD-10-CM | POA: Diagnosis not present

## 2015-08-01 DIAGNOSIS — R109 Unspecified abdominal pain: Secondary | ICD-10-CM | POA: Diagnosis not present

## 2015-08-01 DIAGNOSIS — R627 Adult failure to thrive: Secondary | ICD-10-CM | POA: Diagnosis not present

## 2015-08-01 DIAGNOSIS — S298XXA Other specified injuries of thorax, initial encounter: Secondary | ICD-10-CM | POA: Diagnosis not present

## 2015-08-01 DIAGNOSIS — J449 Chronic obstructive pulmonary disease, unspecified: Secondary | ICD-10-CM | POA: Diagnosis present

## 2015-08-01 DIAGNOSIS — M50321 Other cervical disc degeneration at C4-C5 level: Secondary | ICD-10-CM | POA: Diagnosis not present

## 2015-08-01 DIAGNOSIS — Y93K1 Activity, walking an animal: Secondary | ICD-10-CM | POA: Diagnosis not present

## 2015-08-01 DIAGNOSIS — S060X9A Concussion with loss of consciousness of unspecified duration, initial encounter: Secondary | ICD-10-CM | POA: Diagnosis not present

## 2015-08-01 DIAGNOSIS — Z87891 Personal history of nicotine dependence: Secondary | ICD-10-CM | POA: Diagnosis not present

## 2015-08-01 DIAGNOSIS — G44309 Post-traumatic headache, unspecified, not intractable: Secondary | ICD-10-CM | POA: Diagnosis present

## 2015-08-01 DIAGNOSIS — R4182 Altered mental status, unspecified: Secondary | ICD-10-CM | POA: Diagnosis present

## 2015-08-01 DIAGNOSIS — W19XXXA Unspecified fall, initial encounter: Secondary | ICD-10-CM | POA: Diagnosis present

## 2015-08-01 DIAGNOSIS — Z79899 Other long term (current) drug therapy: Secondary | ICD-10-CM | POA: Diagnosis not present

## 2015-08-01 DIAGNOSIS — S12400A Unspecified displaced fracture of fifth cervical vertebra, initial encounter for closed fracture: Secondary | ICD-10-CM | POA: Diagnosis not present

## 2015-08-01 DIAGNOSIS — S14109A Unspecified injury at unspecified level of cervical spinal cord, initial encounter: Secondary | ICD-10-CM | POA: Diagnosis not present

## 2015-08-01 DIAGNOSIS — M4312 Spondylolisthesis, cervical region: Secondary | ICD-10-CM | POA: Diagnosis not present

## 2015-08-01 DIAGNOSIS — Z9842 Cataract extraction status, left eye: Secondary | ICD-10-CM

## 2015-08-01 DIAGNOSIS — S0003XA Contusion of scalp, initial encounter: Secondary | ICD-10-CM | POA: Diagnosis present

## 2015-08-01 DIAGNOSIS — I739 Peripheral vascular disease, unspecified: Secondary | ICD-10-CM | POA: Diagnosis present

## 2015-08-01 DIAGNOSIS — S06899A Other specified intracranial injury with loss of consciousness of unspecified duration, initial encounter: Secondary | ICD-10-CM | POA: Diagnosis not present

## 2015-08-01 DIAGNOSIS — I10 Essential (primary) hypertension: Secondary | ICD-10-CM | POA: Diagnosis present

## 2015-08-01 DIAGNOSIS — W1830XA Fall on same level, unspecified, initial encounter: Secondary | ICD-10-CM | POA: Diagnosis present

## 2015-08-01 DIAGNOSIS — Z7983 Long term (current) use of bisphosphonates: Secondary | ICD-10-CM | POA: Diagnosis not present

## 2015-08-01 DIAGNOSIS — S0990XA Unspecified injury of head, initial encounter: Secondary | ICD-10-CM | POA: Diagnosis not present

## 2015-08-01 LAB — COMPREHENSIVE METABOLIC PANEL
ALT: 180 U/L — ABNORMAL HIGH (ref 14–54)
AST: 171 U/L — ABNORMAL HIGH (ref 15–41)
Albumin: 3.5 g/dL (ref 3.5–5.0)
Alkaline Phosphatase: 80 U/L (ref 38–126)
Anion gap: 4 — ABNORMAL LOW (ref 5–15)
BUN: 19 mg/dL (ref 6–20)
CO2: 28 mmol/L (ref 22–32)
Calcium: 8.8 mg/dL — ABNORMAL LOW (ref 8.9–10.3)
Chloride: 102 mmol/L (ref 101–111)
Creatinine, Ser: 1.25 mg/dL — ABNORMAL HIGH (ref 0.44–1.00)
GFR calc Af Amer: 50 mL/min — ABNORMAL LOW (ref >60)
GFR calc non Af Amer: 43 mL/min — ABNORMAL LOW (ref >60)
Glucose, Bld: 136 mg/dL — ABNORMAL HIGH (ref 65–99)
Potassium: 5 mmol/L (ref 3.5–5.1)
Sodium: 134 mmol/L — ABNORMAL LOW (ref 135–145)
Total Bilirubin: 0.4 mg/dL (ref 0.3–1.2)
Total Protein: 6.5 g/dL (ref 6.5–8.1)

## 2015-08-01 LAB — CBC
HCT: 39.1 % (ref 36.0–46.0)
Hemoglobin: 12.3 g/dL (ref 12.0–15.0)
MCH: 29.4 pg (ref 26.0–34.0)
MCHC: 31.5 g/dL (ref 30.0–36.0)
MCV: 93.3 fL (ref 78.0–100.0)
Platelets: 169 10*3/uL (ref 150–400)
RBC: 4.19 MIL/uL (ref 3.87–5.11)
RDW: 13.4 % (ref 11.5–15.5)
WBC: 8.2 10*3/uL (ref 4.0–10.5)

## 2015-08-01 LAB — TYPE AND SCREEN
ABO/RH(D): B POS
Antibody Screen: NEGATIVE

## 2015-08-01 LAB — PROTIME-INR
INR: 1.04 (ref 0.00–1.49)
Prothrombin Time: 13.8 seconds (ref 11.6–15.2)

## 2015-08-01 LAB — TROPONIN I: Troponin I: 0.15 ng/mL — ABNORMAL HIGH (ref <0.031)

## 2015-08-01 LAB — ABO/RH: ABO/RH(D): B POS

## 2015-08-01 MED ORDER — ALBUTEROL SULFATE (2.5 MG/3ML) 0.083% IN NEBU: 2.5000 mg | INHALATION_SOLUTION | Freq: Four times a day (QID) | RESPIRATORY_TRACT | Status: DC | PRN

## 2015-08-01 MED ORDER — OXYCODONE-ACETAMINOPHEN 5-325 MG PO TABS
1.0000 | ORAL_TABLET | ORAL | Status: DC | PRN
  Administered 2015-08-01 – 2015-08-02 (×2): 1 via ORAL
  Filled 2015-08-01 (×2): qty 1

## 2015-08-01 MED ORDER — ATORVASTATIN CALCIUM 40 MG PO TABS
40.0000 mg | ORAL_TABLET | Freq: Every day | ORAL | Status: DC
  Administered 2015-08-01 – 2015-08-04 (×4): 40 mg via ORAL
  Filled 2015-08-01 (×4): qty 1

## 2015-08-01 MED ORDER — SODIUM CHLORIDE 0.9 % IV BOLUS (SEPSIS)
500.0000 mL | Freq: Once | INTRAVENOUS | Status: AC
  Administered 2015-08-02: 500 mL via INTRAVENOUS

## 2015-08-01 MED ORDER — DEXTROSE-NACL 5-0.9 % IV SOLN
INTRAVENOUS | Status: DC
  Administered 2015-08-01: 18:00:00 via INTRAVENOUS
  Administered 2015-08-03: 50 mL/h via INTRAVENOUS
  Administered 2015-08-03: 03:00:00 via INTRAVENOUS

## 2015-08-01 MED ORDER — ALUM & MAG HYDROXIDE-SIMETH 200-200-20 MG/5ML PO SUSP
30.0000 mL | ORAL | Status: DC | PRN
  Administered 2015-08-01 – 2015-08-02 (×2): 30 mL via ORAL
  Filled 2015-08-01 (×2): qty 30

## 2015-08-01 MED ORDER — ONDANSETRON HCL 4 MG PO TABS: 4.0000 mg | ORAL_TABLET | Freq: Four times a day (QID) | ORAL | Status: DC | PRN

## 2015-08-01 MED ORDER — TRAZODONE HCL 100 MG PO TABS
100.0000 mg | ORAL_TABLET | Freq: Every evening | ORAL | Status: DC | PRN
  Filled 2015-08-01: qty 1

## 2015-08-01 MED ORDER — IOHEXOL 300 MG/ML  SOLN
100.0000 mL | Freq: Once | INTRAMUSCULAR | Status: AC | PRN
  Administered 2015-08-01: 80 mL via INTRAVENOUS

## 2015-08-01 MED ORDER — ALBUTEROL SULFATE HFA 108 (90 BASE) MCG/ACT IN AERS: 2.0000 | INHALATION_SPRAY | Freq: Four times a day (QID) | RESPIRATORY_TRACT | Status: DC | PRN

## 2015-08-01 MED ORDER — SODIUM CHLORIDE 0.9 % IV SOLN
INTRAVENOUS | Status: DC
  Administered 2015-08-01: 17:00:00 via INTRAVENOUS

## 2015-08-01 MED ORDER — LORATADINE 10 MG PO TABS
10.0000 mg | ORAL_TABLET | Freq: Every day | ORAL | Status: DC
  Administered 2015-08-02 – 2015-08-04 (×3): 10 mg via ORAL
  Filled 2015-08-01 (×3): qty 1

## 2015-08-01 MED ORDER — HYDROMORPHONE HCL 1 MG/ML IJ SOLN
1.0000 mg | INTRAMUSCULAR | Status: DC | PRN
  Administered 2015-08-01: 1 mg via INTRAVENOUS
  Filled 2015-08-01: qty 1

## 2015-08-01 MED ORDER — SODIUM CHLORIDE 0.9 % IV BOLUS (SEPSIS)
500.0000 mL | Freq: Once | INTRAVENOUS | Status: AC
  Administered 2015-08-01: 500 mL via INTRAVENOUS

## 2015-08-01 MED ORDER — ONDANSETRON HCL 4 MG/2ML IJ SOLN
4.0000 mg | Freq: Four times a day (QID) | INTRAMUSCULAR | Status: DC | PRN
  Administered 2015-08-01: 4 mg via INTRAVENOUS
  Filled 2015-08-01 (×2): qty 2

## 2015-08-01 MED ORDER — BENAZEPRIL HCL 20 MG PO TABS
10.0000 mg | ORAL_TABLET | Freq: Every day | ORAL | Status: DC
  Administered 2015-08-02 – 2015-08-04 (×3): 10 mg via ORAL
  Filled 2015-08-01 (×3): qty 1

## 2015-08-01 MED ORDER — SERTRALINE HCL 100 MG PO TABS
100.0000 mg | ORAL_TABLET | Freq: Every day | ORAL | Status: DC
  Administered 2015-08-02 – 2015-08-04 (×3): 100 mg via ORAL
  Filled 2015-08-01 (×3): qty 1

## 2015-08-01 NOTE — ED Notes (Signed)
Contacted CT about wait time. [facility]

## 2015-08-01 NOTE — ED Notes (Signed)
Per family, pt lethargic this AM. Pt had fall yesterday, seen here for same. Found to have C5 fx. Currently takes Plavix.  Initial BP 70/palp. Pt now AO x4. Reports 8/10 posterior cervical pain, Aspen collar in place.

## 2015-08-01 NOTE — H&P (Signed)
[name] [name] is an 69 y.o. female.   Chief Complaint: fall/  Failure to thrive HPI: Asked to see patient at the request of the emergency room physician due to mental status changes after a fall yesterday. The patient was walking her dog and fell on her right side of her face. She is on Plavix. She was able to get up and call her daughters regarding the hospital. It took her in our though to get back in the house according to her daughter. The patient was evaluated with this head CT, face CT and a CT of her cervical spine since she was complaining of neck pain. Initially the cervical CT was read as normal but then the radiologist created an addendum and stated the patient had a C5 fracture. The patient was placed in a hard collar. She was angulated was able to walk around and sent home according to her daughters. Her head CT was normal. The patient's daughter stated that her mother has been more somnolent today. She states she's not been able to chew her food very easily and is more sluggish than her baseline. Walking is been more difficult as well today and yesterday. She does have some mild baseline right-sided hemiparesis from previous stroke. She has no nausea vomiting. She is also complaining of abdominal pain left greater than right which is new today. She had some transient mild hypotension in the emergency room that responded to normal saline bolus. Her hemoglobin is 12.  Past Medical History  Diagnosis Date  . Hypertension   . Stroke [facility]) 2010    x3 with residual R hemiparesis, s/p R MCA balloon angioplasty (2010)  . Peripheral vascular disease ([place])   . COPD (chronic obstructive pulmonary disease) ([place]) 12/2012    spirometry: Pre: FVC 84%, FEV1 69%, ratio 0.64 consistent with moderate obstruction.  . Hyperlipidemia   . Osteoarthritis     h/o ruptured disc s/p ESI  . Depression   . Smoker     quit 10/2012  . History of chicken pox   . Carotid stenosis     R 50% (12/2012)  . Lower back  pain     h/o HNP s/p surgery  . Osteoporosis 11/2010    DEXA -2.7 spine, thoracic compression fracture  . Aneurysm [facility])     Past Surgical History  Procedure Laterality Date  . Cholecystectomy  1970's  . Appendectomy  1960's  . Cesarean section    . Cataract extraction      bilateral  . L hip surgery  2006    fractured - screws placed  . Dexa  11/2010    T -2.7 spine, -1.9 hip    Family History  Problem Relation Age of Onset  . CAD Mother     MI  . CAD Maternal Aunt     MI  . CAD Sister     MI  . Sudden death Father 35    died in his sleep, chain smoker  . Cirrhosis Father   . Cancer Mother     cervical  . Hypertension Daughter   . Diabetes Maternal Aunt   . Cancer Maternal Grandmother     cervical   Social History:  reports that she has quit smoking. Her smoking use included Cigarettes. She has a 45 pack-year smoking history. She has never used smokeless tobacco. She reports that she does not drink alcohol or use illicit drugs.  Allergies:  Allergies  Allergen Reactions  . Doxycycline Other (See Comments)  Sig GI upset     (Not in a hospital admission)  Results for orders placed or performed during the hospital encounter of 08/01/15 (from the past 48 hour(s))  Comprehensive metabolic panel     Status: Abnormal   Collection Time: 08/01/15  3:26 PM  Result Value Ref Range   Sodium 134 (L) 135 - 145 mmol/L   Potassium 5.0 3.5 - 5.1 mmol/L   Chloride 102 101 - 111 mmol/L   CO2 28 22 - 32 mmol/L   Glucose, Bld 136 (H) 65 - 99 mg/dL   BUN 19 6 - 20 mg/dL   Creatinine, Ser 1.25 (H) 0.44 - 1.00 mg/dL   Calcium 8.8 (L) 8.9 - 10.3 mg/dL   Total Protein 6.5 6.5 - 8.1 g/dL   Albumin 3.5 3.5 - 5.0 g/dL   AST 171 (H) 15 - 41 U/L   ALT 180 (H) 14 - 54 U/L   Alkaline Phosphatase 80 38 - 126 U/L   Total Bilirubin 0.4 0.3 - 1.2 mg/dL   GFR calc non Af Amer 43 (L) >60 mL/min   GFR calc Af Amer 50 (L) >60 mL/min    Comment: (NOTE) The eGFR has been calculated using  the CKD EPI equation. This calculation has not been validated in all clinical situations. eGFR's persistently <60 mL/min signify possible Chronic Kidney Disease.    Anion gap 4 (L) 5 - 15  CBC     Status: None   Collection Time: 08/01/15  3:26 PM  Result Value Ref Range   WBC 8.2 4.0 - 10.5 K/uL   RBC 4.19 3.87 - 5.11 MIL/uL   Hemoglobin 12.3 12.0 - 15.0 g/dL   HCT 39.1 36.0 - 46.0 %   MCV 93.3 78.0 - 100.0 fL   MCH 29.4 26.0 - 34.0 pg   MCHC 31.5 30.0 - 36.0 g/dL   RDW 13.4 11.5 - 15.5 %   Platelets 169 150 - 400 K/uL  Troponin I     Status: Abnormal   Collection Time: 08/01/15  3:26 PM  Result Value Ref Range   Troponin I 0.15 (H) <0.031 ng/mL    Comment:        PERSISTENTLY INCREASED TROPONIN VALUES IN THE RANGE OF 0.04-0.49 ng/mL CAN BE SEEN IN:       -UNSTABLE ANGINA       -CONGESTIVE HEART FAILURE       -MYOCARDITIS       -CHEST TRAUMA       -ARRYHTHMIAS       -LATE PRESENTING MYOCARDIAL INFARCTION       -COPD   CLINICAL FOLLOW-UP RECOMMENDED.   Protime-INR     Status: None   Collection Time: 08/01/15  3:26 PM  Result Value Ref Range   Prothrombin Time 13.8 11.6 - 15.2 seconds   INR 1.04 0.00 - 1.49   Ct Head Wo Contrast  07/31/2015  ADDENDUM REPORT: 07/31/2015 11:50 ADDENDUM: Upon further review, there is noted a mildly displaced small fracture involving the anterior and inferior corner of C5 vertebral body, which may be acute or chronic. Critical Value/emergent results were called by telephone at the time of interpretation on 07/31/2015 at 11:49 am to [facility] , who verbally acknowledged these results. Electronically Signed   By: Marijo Conception, M.D.   On: 07/31/2015 11:50  07/31/2015  CLINICAL DATA:  Posterior neck pain, right eye swelling and bruising and posttraumatic headache after fall walking dog this morning. Probable loss of consciousness. EXAM: CT  HEAD WITHOUT CONTRAST CT MAXILLOFACIAL WITHOUT CONTRAST CT CERVICAL SPINE WITHOUT CONTRAST TECHNIQUE:  Multidetector CT imaging of the head, cervical spine, and maxillofacial structures were performed using the standard protocol without intravenous contrast. Multiplanar CT image reconstructions of the cervical spine and maxillofacial structures were also generated. COMPARISON:  CT scan of head of December 30, 2014. FINDINGS: CT HEAD FINDINGS Bony calvarium appears intact. Large right frontal scalp hematoma is noted. Moderate diffuse cortical atrophy is noted. Mild chronic ischemic white matter disease is noted. No mass effect or midline shift is noted. Ventricular size is within normal limits. There is no evidence of mass lesion, hemorrhage or acute infarction. CT MAXILLOFACIAL FINDINGS No fracture or other bony abnormality is noted. Paranasal sinuses appear normal. Pterygoid plates appear normal. Soft tissue hematoma is seen overlying the right orbit which extends into right maxillary soft tissues. Globes and posterior orbits appear normal. CT CERVICAL SPINE FINDINGS No fracture or spondylolisthesis is noted. Moderate degenerative disc disease is noted at C5-6 and C6-7 with anterior and posterior osteophyte formation. Posterior facet joints appear normal. Visualized lung apices appear normal. IMPRESSION: Large soft tissue hematoma that extends from the right frontal scalp region, over the right orbital region and into right maxillary soft tissues anteriorly. Moderate diffuse cortical atrophy is noted. Mild chronic ischemic white matter disease is noted. No acute intracranial abnormality seen. No evidence of fracture or other bony abnormality seen involving the maxillofacial region. Moderate degenerative disc disease is noted at C5-6 and C6-7. No acute abnormality seen in the cervical spine. Electronically Signed: By: Marijo Conception, M.D. On: 07/31/2015 11:07   Ct Cervical Spine Wo Contrast  07/31/2015  ADDENDUM REPORT: 07/31/2015 11:50 ADDENDUM: Upon further review, there is noted a mildly displaced small fracture  involving the anterior and inferior corner of C5 vertebral body, which may be acute or chronic. Critical Value/emergent results were called by telephone at the time of interpretation on 07/31/2015 at 11:49 am to [facility]  , who verbally acknowledged these results. Electronically Signed   By: Marijo Conception, M.D.   On: 07/31/2015 11:50  07/31/2015  CLINICAL DATA:  Posterior neck pain, right eye swelling and bruising and posttraumatic headache after fall walking dog this morning. Probable loss of consciousness. EXAM: CT HEAD WITHOUT CONTRAST CT MAXILLOFACIAL WITHOUT CONTRAST CT CERVICAL SPINE WITHOUT CONTRAST TECHNIQUE: Multidetector CT imaging of the head, cervical spine, and maxillofacial structures were performed using the standard protocol without intravenous contrast. Multiplanar CT image reconstructions of the cervical spine and maxillofacial structures were also generated. COMPARISON:  CT scan of head of December 30, 2014. FINDINGS: CT HEAD FINDINGS Bony calvarium appears intact. Large right frontal scalp hematoma is noted. Moderate diffuse cortical atrophy is noted. Mild chronic ischemic white matter disease is noted. No mass effect or midline shift is noted. Ventricular size is within normal limits. There is no evidence of mass lesion, hemorrhage or acute infarction. CT MAXILLOFACIAL FINDINGS No fracture or other bony abnormality is noted. Paranasal sinuses appear normal. Pterygoid plates appear normal. Soft tissue hematoma is seen overlying the right orbit which extends into right maxillary soft tissues. Globes and posterior orbits appear normal. CT CERVICAL SPINE FINDINGS No fracture or spondylolisthesis is noted. Moderate degenerative disc disease is noted at C5-6 and C6-7 with anterior and posterior osteophyte formation. Posterior facet joints appear normal. Visualized lung apices appear normal. IMPRESSION: Large soft tissue hematoma that extends from the right frontal scalp region, over the right  orbital region and into right maxillary soft  tissues anteriorly. Moderate diffuse cortical atrophy is noted. Mild chronic ischemic white matter disease is noted. No acute intracranial abnormality seen. No evidence of fracture or other bony abnormality seen involving the maxillofacial region. Moderate degenerative disc disease is noted at C5-6 and C6-7. No acute abnormality seen in the cervical spine. Electronically Signed: By: Lupita Raider, M.D. On: 07/31/2015 11:07   Dg Chest Port 1 View  08/01/2015  CLINICAL DATA:  Pt reports falling yesterday and was seen here for the same; family brought pt back in today because she seemed lethargic and blood pressure abnormal; Pt has h/o HTN and 3 strokes per pt; former smoker EXAM: PORTABLE CHEST 1 VIEW COMPARISON:  10/24/2013 FINDINGS: The heart size and mediastinal contours are within normal limits. Both lungs are clear. The visualized skeletal structures are unremarkable. IMPRESSION: No active disease. Electronically Signed   By: Elige Ko   On: 08/01/2015 17:09   Ct Maxillofacial Wo Cm  07/31/2015  ADDENDUM REPORT: 07/31/2015 11:50 ADDENDUM: Upon further review, there is noted a mildly displaced small fracture involving the anterior and inferior corner of C5 vertebral body, which may be acute or chronic. Critical Value/emergent results were called by telephone at the time of interpretation on 07/31/2015 at 11:49 am to [facility] , who verbally acknowledged these results. Electronically Signed   By: Lupita Raider, M.D.   On: 07/31/2015 11:50  07/31/2015  CLINICAL DATA:  Posterior neck pain, right eye swelling and bruising and posttraumatic headache after fall walking dog this morning. Probable loss of consciousness. EXAM: CT HEAD WITHOUT CONTRAST CT MAXILLOFACIAL WITHOUT CONTRAST CT CERVICAL SPINE WITHOUT CONTRAST TECHNIQUE: Multidetector CT imaging of the head, cervical spine, and maxillofacial structures were performed using the standard protocol without  intravenous contrast. Multiplanar CT image reconstructions of the cervical spine and maxillofacial structures were also generated. COMPARISON:  CT scan of head of December 30, 2014. FINDINGS: CT HEAD FINDINGS Bony calvarium appears intact. Large right frontal scalp hematoma is noted. Moderate diffuse cortical atrophy is noted. Mild chronic ischemic white matter disease is noted. No mass effect or midline shift is noted. Ventricular size is within normal limits. There is no evidence of mass lesion, hemorrhage or acute infarction. CT MAXILLOFACIAL FINDINGS No fracture or other bony abnormality is noted. Paranasal sinuses appear normal. Pterygoid plates appear normal. Soft tissue hematoma is seen overlying the right orbit which extends into right maxillary soft tissues. Globes and posterior orbits appear normal. CT CERVICAL SPINE FINDINGS No fracture or spondylolisthesis is noted. Moderate degenerative disc disease is noted at C5-6 and C6-7 with anterior and posterior osteophyte formation. Posterior facet joints appear normal. Visualized lung apices appear normal. IMPRESSION: Large soft tissue hematoma that extends from the right frontal scalp region, over the right orbital region and into right maxillary soft tissues anteriorly. Moderate diffuse cortical atrophy is noted. Mild chronic ischemic white matter disease is noted. No acute intracranial abnormality seen. No evidence of fracture or other bony abnormality seen involving the maxillofacial region. Moderate degenerative disc disease is noted at C5-6 and C6-7. No acute abnormality seen in the cervical spine. Electronically Signed: By: Lupita Raider, M.D. On: 07/31/2015 11:07    Review of Systems  Constitutional: Positive for malaise/fatigue.  Eyes: Negative for blurred vision.  Respiratory: Negative for shortness of breath.   Cardiovascular: Negative for chest pain.  Gastrointestinal: Positive for abdominal pain.  Genitourinary: Negative.   Musculoskeletal:  Positive for myalgias, falls and neck pain.  Skin: Negative.  Neurological: Positive for dizziness, weakness and headaches. Negative for tingling, tremors, speech change, focal weakness, seizures and loss of consciousness.  Endo/Heme/Allergies: Bruises/bleeds easily.  Psychiatric/Behavioral: Positive for depression and memory loss.    Blood pressure 100/44, pulse 83, temperature 98.7 F (37.1 C), temperature source Oral, resp. rate 12, SpO2 96 %. Physical Exam  Constitutional: She appears lethargic. No distress.  HENT:  Head: Head is with contusion.    Neck: Spinous process tenderness present.    Cardiovascular: Normal rate and regular rhythm.   Respiratory: Effort normal and breath sounds normal.  GI: Soft. Bowel sounds are normal. She exhibits no mass. There is tenderness. There is no rebound and no guarding.  Musculoskeletal: Normal range of motion.  Neurological: She appears lethargic. No cranial nerve deficit or sensory deficit. GCS eye subscore is 4. GCS verbal subscore is 5. GCS motor subscore is 6.  Mild right-sided hemiparesis with strength a 4 out of 5 on the right inner upper and lower extremities and a 5 out of 5 on her left inner upper and lower extremities. This is her baseline according to her family.  Skin: Skin is warm and dry.  Psychiatric: Her affect is blunt. Her speech is delayed. She is slowed and withdrawn. She exhibits abnormal recent memory.     Assessment/Plan Status post fall on 07/31/2015  Right scalp hematoma  Mental status changes  Abdominal pain  C5 fracture currently in hard collar  History of CVA with mild right sided hemiparesis  We'll repeat head CT, cervical spine CT and obtain an abdomen and pelvis CT since she is on Plavix. The patient is awake and alert and is moving all 4 tremors. He does have some mild right-sided weakness from her stroke but her motor and sensory function appear intact. I've asked the emergency room physician to  contact neurosurgery to see if any further imaging of her cervical spine is necessary specifically MRI. We'll repeat her head CT since she's had mental status changes and cervical spine CT. She has been more somnolent today according to her daughters and she was walking better yesterday and today she feels more globally weak. She does have amnesia to the event. She was seen yesterday in the emergency room evaluated in discharge. Will admit to stepdown given mental status changes, 2 neuro checks for the next 12 hours and obtained the above tension studies. Will hold Plavix for now.  Jedaiah Rathbun A. 08/01/2015, 5:44 PM

## 2015-08-01 NOTE — ED Notes (Signed)
Md Pfeiffer at bedside.  

## 2015-08-01 NOTE — ED Provider Notes (Signed)
CSN: [id number]     Arrival date & time 08/01/15  1514 History   First MD Initiated Contact with Patient 08/01/15 1559     Chief Complaint  Patient presents with  . Altered Mental Status  . Hypotension     (Consider location/radiation/quality/duration/timing/severity/associated sxs/prior Treatment) HPI Patient fell in her home yesterday. She was on a second step and leaned forward to pick up her dog's leash. She fell forward onto the concrete floor. She reports she hit her face first. No one was home. She is not sure if she had temporary loss of consciousness or not. She estimates over the period of an hour, she was able to crawl and get herself back up off of the ground and then call her daughter. She reports most of her pain is in her neck. She was identified to have a stable cervical fracture yesterday and has been placed in an Aspen collar. She does have extensive facial swelling and pain as well. The patient and her daughters report she has gotten increasingly weak since yesterday evening. At time of discharge her daughters report that she was ambulatory and continued to be so at home up until after dinner time. After dinner she seemed to be increasingly weak and had difficulty getting out of bed. They do not advise that she is specifically confused as to person or place, however today as they were trying to have her stand to walk, she seemed to stand in one place and try to move her feet but be unable to move forward. Her daughter was trying to assist her in the hallway and the patient reported she had to sit down and sank to the floor. The patient is now identifying some discomfort in her left lower chest or upper abdomen. She does have multiple sites of discomfort but none is as pronounced as her neck. She denies she's had one extremity that she's been unable to move, however her legs feel weak to her. She has not developed vomiting, photophobia or focal numbness. The patient had been on Plavix,  she has not taken her regular medications today due to the symptoms. Past Medical History  Diagnosis Date  . Hypertension   . Stroke [facility]) 2010    x3 with residual R hemiparesis, s/p R MCA balloon angioplasty (2010)  . Peripheral vascular disease (HCC)   . COPD (chronic obstructive pulmonary disease) (HCC) 12/2012    spirometry: Pre: FVC 84%, FEV1 69%, ratio 0.64 consistent with moderate obstruction.  . Hyperlipidemia   . Osteoarthritis     h/o ruptured disc s/p ESI  . Depression   . Smoker     quit 10/2012  . History of chicken pox   . Carotid stenosis     R 50% (12/2012)  . Lower back pain     h/o HNP s/p surgery  . Osteoporosis 11/2010    DEXA -2.7 spine, thoracic compression fracture  . Aneurysm [facility])    Past Surgical History  Procedure Laterality Date  . Cholecystectomy  1970's  . Appendectomy  1960's  . Cesarean section    . Cataract extraction      bilateral  . L hip surgery  2006    fractured - screws placed  . Dexa  11/2010    T -2.7 spine, -1.9 hip   Family History  Problem Relation Age of Onset  . CAD Mother     MI  . CAD Maternal Aunt     MI  . CAD Sister  MI  . Sudden death Father 33    died in his sleep, chain smoker  . Cirrhosis Father   . Cancer Mother     cervical  . Hypertension Daughter   . Diabetes Maternal Aunt   . Cancer Maternal Grandmother     cervical   Social History  Substance Use Topics  . Smoking status: Former Smoker -- 1.00 packs/day for 45 years    Types: Cigarettes  . Smokeless tobacco: Never Used  . Alcohol Use: No   OB History    No data available     Review of Systems 10 Systems reviewed and are negative for acute change except as noted in the HPI.    Allergies  Doxycycline  Home Medications   Prior to Admission medications   Medication Sig Start Date End Date Taking? Authorizing Provider  albuterol (PROVENTIL HFA;VENTOLIN HFA) 108 (90 BASE) MCG/ACT inhaler Inhale 2 puffs into the lungs every 6 (six) hours  as needed for wheezing or shortness of breath.    Yes Historical Provider, MD  alendronate (FOSAMAX) 70 MG tablet TAKE 1 TABLET WEEKLY. TAKE WITH A FULL GLASS OF WATER ON AN EMPTY STOMACH. Patient taking differently: TAKE 1 TABLET WEEKLY ON TUESDAYS -TAKE WITH A FULL GLASS OF WATER ON AN EMPTY STOMACH. 12/12/14  Yes [name], MD  aspirin EC 81 MG tablet Take 81 mg by mouth daily.   Yes Historical Provider, MD  atorvastatin (LIPITOR) 40 MG tablet TAKE 1 TABLET EVERY DAY 05/06/15  Yes [name], MD  benazepril (LOTENSIN) 10 MG tablet Take 1 tablet (10 mg total) by mouth daily. 01/02/15  Yes [name], MD  Calcium Carb-Cholecalciferol (CALCIUM-VITAMIN D) 600-400 MG-UNIT TABS Take 1 tablet by mouth daily.   Yes Historical Provider, MD  clopidogrel (PLAVIX) 75 MG tablet TAKE 1 TABLET EVERY DAY 05/06/15  Yes [name], MD  fexofenadine (ALLEGRA) 180 MG tablet Take 180 mg by mouth daily.   Yes Historical Provider, MD  oxyCODONE-acetaminophen (PERCOCET/ROXICET) 5-325 MG tablet Take 1-2 tablets by mouth every 4 (four) hours as needed for severe pain. 07/31/15  Yes Melton Krebs, PA-C  sertraline (ZOLOFT) 100 MG tablet Take 1 tablet (100 mg total) by mouth daily. 01/29/15  Yes [name], MD  traZODone (DESYREL) 100 MG tablet TAKE 1 TABLET AT BEDTIME AS NEEDED FOR SLEEP 10/03/14  Yes [name], MD  traMADol (ULTRAM) 50 MG tablet TAKE ONE TABLET BY MOUTH TWICE DAILY AS NEEDED Patient not taking: Reported on 07/31/2015 02/09/15   [name], MD   BP 89/47 mmHg  Pulse 88  Temp(Src) 98.7 F (37.1 C) (Oral)  Resp 13  SpO2 96% Physical Exam  Constitutional: She is oriented to person, place, and time.  Patient is in an Aspen collar and has significant facial swelling. She is alert and nontoxic. She does not have acute respiratory distress. She does appear somewhat fatigued.  HENT:  Extensive facial ecchymosis and swelling on the right side of the face. Oral cavity  is patent. Posterior oropharynx is patent without difficulty handling secretions.  Eyes: EOM are normal. Pupils are equal, round, and reactive to light.  Extraocular motions are intact. Patient does have a conjunctival hematoma on the right.  Neck:  Cervical collar is maintained in position.  Cardiovascular: Normal rate, regular rhythm, normal heart sounds and intact distal pulses.   Pulmonary/Chest: Effort normal and breath sounds normal.  No significant chest wall contusion or abrasion is identified. Patient does not have crepitus. She does  endorse some tenderness to palpation of the left lower rib cage.  Abdominal: Soft. Bowel sounds are normal. She exhibits no distension.  Patient endorsing mild discomfort in the upper abdomen, there is no guarding. There is no ecchymosis or hematoma on the abdominal wall or the flanks.  Musculoskeletal:  Visual examination of the back does not show contusion or abrasion. She does not localize vertebral body pain to the thoracic or lumbar spine, she does however endorse discomfort with palpation at multiple levels. Patient did have significant discomfort with rolling on her side.  Right upper extremity has a dressing in place on the forearm. Patient has abrasions and skin tears. The hand is neurovascularly intact. Lower extremities have minor contusions to the knees. There is no joint effusion or deformity. Patient can elevate each extremity independently off of the bed. She does not have ankle or foot deformity. Patient is not endorsing significant pain with pelvic compression or palpation over the trochanters.  Neurological: She is alert and oriented to person, place, and time. No cranial nerve deficit. She exhibits normal muscle tone. Coordination normal.  Skin: Skin is warm and dry. There is pallor.  Psychiatric: She has a normal mood and affect.    ED Course  Procedures (including critical care time) Labs Review Labs Reviewed  CBC  COMPREHENSIVE  METABOLIC PANEL  TROPONIN I  PROTIME-INR  TYPE AND SCREEN    Imaging Review Ct Head Wo Contrast  07/31/2015  ADDENDUM REPORT: 07/31/2015 11:50 ADDENDUM: Upon further review, there is noted a mildly displaced small fracture involving the anterior and inferior corner of C5 vertebral body, which may be acute or chronic. Critical Value/emergent results were called by telephone at the time of interpretation on 07/31/2015 at 11:49 am to [facility] , who verbally acknowledged these results. Electronically Signed   By: Lupita RaiderJames  Green Jr, M.D.   On: 07/31/2015 11:50  07/31/2015  CLINICAL DATA:  Posterior neck pain, right eye swelling and bruising and posttraumatic headache after fall walking dog this morning. Probable loss of consciousness. EXAM: CT HEAD WITHOUT CONTRAST CT MAXILLOFACIAL WITHOUT CONTRAST CT CERVICAL SPINE WITHOUT CONTRAST TECHNIQUE: Multidetector CT imaging of the head, cervical spine, and maxillofacial structures were performed using the standard protocol without intravenous contrast. Multiplanar CT image reconstructions of the cervical spine and maxillofacial structures were also generated. COMPARISON:  CT scan of head of December 30, 2014. FINDINGS: CT HEAD FINDINGS Bony calvarium appears intact. Large right frontal scalp hematoma is noted. Moderate diffuse cortical atrophy is noted. Mild chronic ischemic white matter disease is noted. No mass effect or midline shift is noted. Ventricular size is within normal limits. There is no evidence of mass lesion, hemorrhage or acute infarction. CT MAXILLOFACIAL FINDINGS No fracture or other bony abnormality is noted. Paranasal sinuses appear normal. Pterygoid plates appear normal. Soft tissue hematoma is seen overlying the right orbit which extends into right maxillary soft tissues. Globes and posterior orbits appear normal. CT CERVICAL SPINE FINDINGS No fracture or spondylolisthesis is noted. Moderate degenerative disc disease is noted at C5-6 and C6-7 with  anterior and posterior osteophyte formation. Posterior facet joints appear normal. Visualized lung apices appear normal. IMPRESSION: Large soft tissue hematoma that extends from the right frontal scalp region, over the right orbital region and into right maxillary soft tissues anteriorly. Moderate diffuse cortical atrophy is noted. Mild chronic ischemic white matter disease is noted. No acute intracranial abnormality seen. No evidence of fracture or other bony abnormality seen involving the maxillofacial region. Moderate degenerative  disc disease is noted at C5-6 and C6-7. No acute abnormality seen in the cervical spine. Electronically Signed: By: Lupita Raider, M.D. On: 07/31/2015 11:07   Ct Cervical Spine Wo Contrast  07/31/2015  ADDENDUM REPORT: 07/31/2015 11:50 ADDENDUM: Upon further review, there is noted a mildly displaced small fracture involving the anterior and inferior corner of C5 vertebral body, which may be acute or chronic. Critical Value/emergent results were called by telephone at the time of interpretation on 07/31/2015 at 11:49 am to [facility] , who verbally acknowledged these results. Electronically Signed   By: Lupita Raider, M.D.   On: 07/31/2015 11:50  07/31/2015  CLINICAL DATA:  Posterior neck pain, right eye swelling and bruising and posttraumatic headache after fall walking dog this morning. Probable loss of consciousness. EXAM: CT HEAD WITHOUT CONTRAST CT MAXILLOFACIAL WITHOUT CONTRAST CT CERVICAL SPINE WITHOUT CONTRAST TECHNIQUE: Multidetector CT imaging of the head, cervical spine, and maxillofacial structures were performed using the standard protocol without intravenous contrast. Multiplanar CT image reconstructions of the cervical spine and maxillofacial structures were also generated. COMPARISON:  CT scan of head of December 30, 2014. FINDINGS: CT HEAD FINDINGS Bony calvarium appears intact. Large right frontal scalp hematoma is noted. Moderate diffuse cortical atrophy is  noted. Mild chronic ischemic white matter disease is noted. No mass effect or midline shift is noted. Ventricular size is within normal limits. There is no evidence of mass lesion, hemorrhage or acute infarction. CT MAXILLOFACIAL FINDINGS No fracture or other bony abnormality is noted. Paranasal sinuses appear normal. Pterygoid plates appear normal. Soft tissue hematoma is seen overlying the right orbit which extends into right maxillary soft tissues. Globes and posterior orbits appear normal. CT CERVICAL SPINE FINDINGS No fracture or spondylolisthesis is noted. Moderate degenerative disc disease is noted at C5-6 and C6-7 with anterior and posterior osteophyte formation. Posterior facet joints appear normal. Visualized lung apices appear normal. IMPRESSION: Large soft tissue hematoma that extends from the right frontal scalp region, over the right orbital region and into right maxillary soft tissues anteriorly. Moderate diffuse cortical atrophy is noted. Mild chronic ischemic white matter disease is noted. No acute intracranial abnormality seen. No evidence of fracture or other bony abnormality seen involving the maxillofacial region. Moderate degenerative disc disease is noted at C5-6 and C6-7. No acute abnormality seen in the cervical spine. Electronically Signed: By: Lupita Raider, M.D. On: 07/31/2015 11:07   Ct Maxillofacial Wo Cm  07/31/2015  ADDENDUM REPORT: 07/31/2015 11:50 ADDENDUM: Upon further review, there is noted a mildly displaced small fracture involving the anterior and inferior corner of C5 vertebral body, which may be acute or chronic. Critical Value/emergent results were called by telephone at the time of interpretation on 07/31/2015 at 11:49 am to [facility] , who verbally acknowledged these results. Electronically Signed   By: Lupita Raider, M.D.   On: 07/31/2015 11:50  07/31/2015  CLINICAL DATA:  Posterior neck pain, right eye swelling and bruising and posttraumatic headache after  fall walking dog this morning. Probable loss of consciousness. EXAM: CT HEAD WITHOUT CONTRAST CT MAXILLOFACIAL WITHOUT CONTRAST CT CERVICAL SPINE WITHOUT CONTRAST TECHNIQUE: Multidetector CT imaging of the head, cervical spine, and maxillofacial structures were performed using the standard protocol without intravenous contrast. Multiplanar CT image reconstructions of the cervical spine and maxillofacial structures were also generated. COMPARISON:  CT scan of head of December 30, 2014. FINDINGS: CT HEAD FINDINGS Bony calvarium appears intact. Large right frontal scalp hematoma is noted. Moderate diffuse  cortical atrophy is noted. Mild chronic ischemic white matter disease is noted. No mass effect or midline shift is noted. Ventricular size is within normal limits. There is no evidence of mass lesion, hemorrhage or acute infarction. CT MAXILLOFACIAL FINDINGS No fracture or other bony abnormality is noted. Paranasal sinuses appear normal. Pterygoid plates appear normal. Soft tissue hematoma is seen overlying the right orbit which extends into right maxillary soft tissues. Globes and posterior orbits appear normal. CT CERVICAL SPINE FINDINGS No fracture or spondylolisthesis is noted. Moderate degenerative disc disease is noted at C5-6 and C6-7 with anterior and posterior osteophyte formation. Posterior facet joints appear normal. Visualized lung apices appear normal. IMPRESSION: Large soft tissue hematoma that extends from the right frontal scalp region, over the right orbital region and into right maxillary soft tissues anteriorly. Moderate diffuse cortical atrophy is noted. Mild chronic ischemic white matter disease is noted. No acute intracranial abnormality seen. No evidence of fracture or other bony abnormality seen involving the maxillofacial region. Moderate degenerative disc disease is noted at C5-6 and C6-7. No acute abnormality seen in the cervical spine. Electronically Signed: By: Lupita Raider, M.D. On:  07/31/2015 11:07   I have personally reviewed and evaluated these images and lab results as part of my medical decision-making.   EKG Interpretation None     Trauma surgery consult (17:00) advises to contact neurosurgery regarding further diagnostic imaging of the patient's neck. They do advise for CT abdomen and will evaluate the patient in the emergency department.  Reviewed with neurosurgery, Dr. Nita Sells (17:30) advises patient should have MRI of the cervical spine however this can wait until other diagnostic CT scans are done as needed. MDM   Final diagnoses:  None   Patient is admitted after increasing weakness and pain after a fall yesterday. She did present with some hypotension however at this time there does not appear to be an acute bleeding source. At this time there is no local focus of infection to suggest initiation of antibiotics. The patient will be admitted to trauma service for further diagnostic evaluation and monitoring.    Arby Barrette, MD 08/06/15 [phone]

## 2015-08-01 NOTE — ED Notes (Signed)
Attempted report x1. 

## 2015-08-02 ENCOUNTER — Inpatient Hospital Stay (HOSPITAL_COMMUNITY): Payer: Commercial Managed Care - HMO

## 2015-08-02 ENCOUNTER — Encounter (HOSPITAL_COMMUNITY): Payer: Self-pay | Admitting: Family Medicine

## 2015-08-02 LAB — CBC
HCT: 33.8 % — ABNORMAL LOW (ref 36.0–46.0)
Hemoglobin: 10.4 g/dL — ABNORMAL LOW (ref 12.0–15.0)
MCH: 29.2 pg (ref 26.0–34.0)
MCHC: 30.8 g/dL (ref 30.0–36.0)
MCV: 94.9 fL (ref 78.0–100.0)
Platelets: 123 10*3/uL — ABNORMAL LOW (ref 150–400)
RBC: 3.56 MIL/uL — ABNORMAL LOW (ref 3.87–5.11)
RDW: 13.5 % (ref 11.5–15.5)
WBC: 7.1 10*3/uL (ref 4.0–10.5)

## 2015-08-02 LAB — URINALYSIS, ROUTINE W REFLEX MICROSCOPIC
Bilirubin Urine: NEGATIVE
Glucose, UA: NEGATIVE mg/dL
Ketones, ur: NEGATIVE mg/dL
Nitrite: NEGATIVE
Protein, ur: NEGATIVE mg/dL
Specific Gravity, Urine: 1.013 (ref 1.005–1.030)
Urobilinogen, UA: 0.2 mg/dL (ref 0.0–1.0)
pH: 5.5 (ref 5.0–8.0)

## 2015-08-02 LAB — COMPREHENSIVE METABOLIC PANEL
ALT: 115 U/L — ABNORMAL HIGH (ref 14–54)
AST: 92 U/L — ABNORMAL HIGH (ref 15–41)
Albumin: 2.8 g/dL — ABNORMAL LOW (ref 3.5–5.0)
Alkaline Phosphatase: 59 U/L (ref 38–126)
Anion gap: 5 (ref 5–15)
BUN: 12 mg/dL (ref 6–20)
CO2: 26 mmol/L (ref 22–32)
Calcium: 7.7 mg/dL — ABNORMAL LOW (ref 8.9–10.3)
Chloride: 103 mmol/L (ref 101–111)
Creatinine, Ser: 0.82 mg/dL (ref 0.44–1.00)
GFR calc Af Amer: >60 mL/min (ref >60)
GFR calc non Af Amer: >60 mL/min (ref >60)
Glucose, Bld: 125 mg/dL — ABNORMAL HIGH (ref 65–99)
Potassium: 4.3 mmol/L (ref 3.5–5.1)
Sodium: 134 mmol/L — ABNORMAL LOW (ref 135–145)
Total Bilirubin: 0.5 mg/dL (ref 0.3–1.2)
Total Protein: 5.3 g/dL — ABNORMAL LOW (ref 6.5–8.1)

## 2015-08-02 LAB — URINE MICROSCOPIC-ADD ON

## 2015-08-02 LAB — MRSA PCR SCREENING: MRSA by PCR: NEGATIVE

## 2015-08-02 MED ORDER — HYDROMORPHONE HCL 1 MG/ML IJ SOLN
0.5000 mg | INTRAMUSCULAR | Status: DC | PRN
Start: 2015-08-02 — End: 2015-08-04
  Administered 2015-08-02: 0.5 mg via INTRAVENOUS
  Filled 2015-08-02: qty 1

## 2015-08-02 MED ORDER — ENOXAPARIN SODIUM 40 MG/0.4ML ~~LOC~~ SOLN
40.0000 mg | SUBCUTANEOUS | Status: DC
  Administered 2015-08-02 – 2015-08-04 (×3): 40 mg via SUBCUTANEOUS
  Filled 2015-08-02 (×3): qty 0.4

## 2015-08-02 MED ORDER — TRAMADOL HCL 50 MG PO TABS
50.0000 mg | ORAL_TABLET | Freq: Four times a day (QID) | ORAL | Status: DC | PRN
  Administered 2015-08-02: 100 mg via ORAL
  Administered 2015-08-03 (×2): 50 mg via ORAL
  Filled 2015-08-02 (×2): qty 1
  Filled 2015-08-02: qty 2

## 2015-08-02 MED ORDER — POLYETHYLENE GLYCOL 3350 17 G PO PACK
17.0000 g | PACK | Freq: Every day | ORAL | Status: DC
  Administered 2015-08-02 – 2015-08-04 (×3): 17 g via ORAL
  Filled 2015-08-02 (×3): qty 1

## 2015-08-02 MED ORDER — SULFAMETHOXAZOLE-TRIMETHOPRIM 800-160 MG PO TABS
1.0000 | ORAL_TABLET | Freq: Two times a day (BID) | ORAL | Status: DC
  Administered 2015-08-02 – 2015-08-04 (×4): 1 via ORAL
  Filled 2015-08-02 (×4): qty 1

## 2015-08-02 MED ORDER — DOCUSATE SODIUM 100 MG PO CAPS
100.0000 mg | ORAL_CAPSULE | Freq: Two times a day (BID) | ORAL | Status: DC
  Administered 2015-08-02 – 2015-08-04 (×5): 100 mg via ORAL
  Filled 2015-08-02 (×5): qty 1

## 2015-08-02 MED ORDER — INFLUENZA VAC SPLIT QUAD 0.5 ML IM SUSY
0.5000 mL | PREFILLED_SYRINGE | INTRAMUSCULAR | Status: AC
  Administered 2015-08-03: 0.5 mL via INTRAMUSCULAR
  Filled 2015-08-02: qty 0.5

## 2015-08-02 MED ORDER — NAPROXEN 250 MG PO TABS
500.0000 mg | ORAL_TABLET | Freq: Two times a day (BID) | ORAL | Status: DC
  Administered 2015-08-02 – 2015-08-04 (×5): 500 mg via ORAL
  Filled 2015-08-02 (×5): qty 2

## 2015-08-02 NOTE — Progress Notes (Signed)
Patient ID: Elray McgregorHattie S Rinkenberger, female   DOB: [birth year], 69 y.o.   MRN: [id number]   LOS: 1 day   Subjective: Better this morning but still c/o face and neck pain. Confusion yesterday and last night but no insight into that.   Objective: Vital signs in last 24 hours: Temp:  [97.1 F (36.2 C)-98.7 F (37.1 C)] 98.2 F (36.8 C) (11/06 0734) Pulse Rate:  [81-97] 82 (11/06 0600) Resp:  [11-28] 11 (11/06 0600) BP: (76-116)/(38-61) 112/55 mmHg (11/06 0734) SpO2:  [86 %-100 %] 95 % (11/06 0600) FiO2 (%):  [31 %] 31 % (11/06 0734) Weight:  [79.969 kg (176 lb 4.8 oz)] 79.969 kg (176 lb 4.8 oz) (11/05 1900)    Laboratory  CBC  Recent Labs  08/01/15 1526 08/02/15 0140  WBC 8.2 7.1  HGB 12.3 10.4*  HCT 39.1 33.8*  PLT 169 123*   BMET  Recent Labs  08/01/15 1526 08/02/15 0140  NA 134* 134*  K 5.0 4.3  CL 102 103  CO2 28 26  GLUCOSE 136* 125*  BUN 19 12  CREATININE 1.25* 0.82  CALCIUM 8.8* 7.7*    Physical Exam General appearance: alert and no distress Resp: clear to auscultation bilaterally Cardio: regular rate and rhythm GI: normal findings: bowel sounds normal and soft, non-tender   Assessment/Plan: Fall Questionable C5 fx -- MR of c-spine per Dr. Bevely Palmeritty AMS -- Will check MRI of brain ABL anemia -- Mild, monitor Mutliple medical problems -- Home meds FEN -- Advance diet, try tramadol for pain as narcotics caused some BP issues VTE -- SCD's, start Lovenox Dispo -- TBI team, transfer to floor    Freeman CaldronMichael J. Donnesha Karg, PA-C Pager: [phone] General Trauma PA Pager: [phone]  08/02/2015

## 2015-08-02 NOTE — Consult Note (Signed)
CC:  Chief Complaint  Patient presents with  . Altered Mental Status  . Hypotension    HPI: [name] [name] is a 69 y.o. female returned to the ER with somnolence after a fall day before yesterday.  She was found to have a C5 fracture and placed in a collar.  She was complaining of weakness and due to her mental status it's unclear if she has focal weakness or if this is more generalized.  An MRI of the cervical spine has been ordered to eval.  PMH: Past Medical History  Diagnosis Date  . Hypertension   . Stroke [facility](HCC) 2010    x3 with residual R hemiparesis, s/p R MCA balloon angioplasty (2010)  . Peripheral vascular disease (HCC)   . COPD (chronic obstructive pulmonary disease) (HCC) 12/2012    spirometry: Pre: FVC 84%, FEV1 69%, ratio 0.64 consistent with moderate obstruction.  . Hyperlipidemia   . Osteoarthritis     h/o ruptured disc s/p ESI  . Depression   . Smoker     quit 10/2012  . History of chicken pox   . Carotid stenosis     R 50% (12/2012)  . Lower back pain     h/o HNP s/p surgery  . Osteoporosis 11/2010    DEXA -2.7 spine, thoracic compression fracture  . Aneurysm (HCC)     PSH: Past Surgical History  Procedure Laterality Date  . Cholecystectomy  1970's  . Appendectomy  1960's  . Cesarean section    . Cataract extraction      bilateral  . L hip surgery  2006    fractured - screws placed  . Dexa  11/2010    T -2.7 spine, -1.9 hip    SH: Social History  Substance Use Topics  . Smoking status: Former Smoker -- 1.00 packs/day for 45 years    Types: Cigarettes  . Smokeless tobacco: Never Used  . Alcohol Use: No    MEDS: Prior to Admission medications   Medication Sig Start Date End Date Taking? Authorizing Provider  albuterol (PROVENTIL HFA;VENTOLIN HFA) 108 (90 BASE) MCG/ACT inhaler Inhale 2 puffs into the lungs every 6 (six) hours as needed for wheezing or shortness of breath.    Yes Historical Provider, MD  alendronate (FOSAMAX) 70 MG tablet TAKE  1 TABLET WEEKLY. TAKE WITH A FULL GLASS OF WATER ON AN EMPTY STOMACH. Patient taking differently: TAKE 1 TABLET WEEKLY ON TUESDAYS -TAKE WITH A FULL GLASS OF WATER ON AN EMPTY STOMACH. 12/12/14  Yes Eustaquio BoydenJavier Gutierrez, MD  aspirin EC 81 MG tablet Take 81 mg by mouth daily.   Yes Historical Provider, MD  atorvastatin (LIPITOR) 40 MG tablet TAKE 1 TABLET EVERY DAY 05/06/15  Yes Eustaquio BoydenJavier Gutierrez, MD  benazepril (LOTENSIN) 10 MG tablet Take 1 tablet (10 mg total) by mouth daily. 01/02/15  Yes Eustaquio BoydenJavier Gutierrez, MD  Calcium Carb-Cholecalciferol (CALCIUM-VITAMIN D) 600-400 MG-UNIT TABS Take 1 tablet by mouth daily.   Yes Historical Provider, MD  clopidogrel (PLAVIX) 75 MG tablet TAKE 1 TABLET EVERY DAY 05/06/15  Yes Eustaquio BoydenJavier Gutierrez, MD  fexofenadine (ALLEGRA) 180 MG tablet Take 180 mg by mouth daily.   Yes Historical Provider, MD  oxyCODONE-acetaminophen (PERCOCET/ROXICET) 5-325 MG tablet Take 1-2 tablets by mouth every 4 (four) hours as needed for severe pain. 07/31/15  Yes Melton KrebsSamantha Nicole Riley, PA-C  sertraline (ZOLOFT) 100 MG tablet Take 1 tablet (100 mg total) by mouth daily. 01/29/15  Yes Eustaquio BoydenJavier Gutierrez, MD  traZODone (DESYREL) 100 MG tablet  TAKE 1 TABLET AT BEDTIME AS NEEDED FOR SLEEP 10/03/14  Yes [name], MD  traMADol (ULTRAM) 50 MG tablet TAKE ONE TABLET BY MOUTH TWICE DAILY AS NEEDED Patient not taking: Reported on 07/31/2015 02/09/15   [name], MD    ALLERGY: Allergies  Allergen Reactions  . Doxycycline Other (See Comments)    Sig GI upset    ROS: ROS  NEUROLOGIC EXAM: Somnolent, blunt affect, confusion about recent events Speech slow, appropriate CN grossly intact Subtle right hemiparesis (baseline per family), left side full strength Sensation grossly intact to LT                      IMGAING: Ct Cervical spine: Multilevel spondylosis, no significant stenosis.  Anterior inferior endplate fracture of C5.  No subluxation. CT Head: No acute injury MRI brain: Atrophy  with ex vacuo hydrocephalus.  No masses or hemorrhage.  No acute infarcts.  Remote infarcts. MRI cervical spine: Spondylosis, no meaningful stenosis.  No hematoma.  IMPRESSION: - 69 y.o. female with post-concussive syndrome and stable C5 anterior inferior endplate fracture.  No obvious focal deficit.  MRI unrevealing.  Stable fracture, collar for comfort only.  PLAN: - Aspen collar for comfort only Will sign off at this time

## 2015-08-02 NOTE — Progress Notes (Signed)
Utilization Review Completed.  

## 2015-08-02 NOTE — Progress Notes (Signed)
Pt arrived to 5M05 @ 1210, Pt A&Ox 4, c/o pain 0/10. Pt VS taken. Pt SL.  Pt without distress. Family at the bedside. Diet ordered, will monitor.

## 2015-08-02 NOTE — Progress Notes (Signed)
Report called to 25M-RN. Pt in MRI currently will transfer once back to unit.

## 2015-08-02 NOTE — Progress Notes (Signed)
Pt SBP 85/43 (53). MD made aware and gave orders for a 500cc bolus. Will continue to monitor pt.

## 2015-08-03 ENCOUNTER — Ambulatory Visit: Payer: Commercial Managed Care - HMO | Admitting: Family Medicine

## 2015-08-03 DIAGNOSIS — N390 Urinary tract infection, site not specified: Secondary | ICD-10-CM | POA: Diagnosis present

## 2015-08-03 DIAGNOSIS — S060X9A Concussion with loss of consciousness of unspecified duration, initial encounter: Secondary | ICD-10-CM | POA: Diagnosis present

## 2015-08-03 DIAGNOSIS — E785 Hyperlipidemia, unspecified: Secondary | ICD-10-CM | POA: Diagnosis not present

## 2015-08-03 DIAGNOSIS — S0083XA Contusion of other part of head, initial encounter: Secondary | ICD-10-CM | POA: Diagnosis present

## 2015-08-03 DIAGNOSIS — S12400A Unspecified displaced fracture of fifth cervical vertebra, initial encounter for closed fracture: Secondary | ICD-10-CM | POA: Diagnosis not present

## 2015-08-03 DIAGNOSIS — S060XAA Concussion with loss of consciousness status unknown, initial encounter: Secondary | ICD-10-CM

## 2015-08-03 DIAGNOSIS — D62 Acute posthemorrhagic anemia: Secondary | ICD-10-CM | POA: Diagnosis present

## 2015-08-03 DIAGNOSIS — M542 Cervicalgia: Secondary | ICD-10-CM | POA: Diagnosis not present

## 2015-08-03 DIAGNOSIS — W19XXXA Unspecified fall, initial encounter: Secondary | ICD-10-CM

## 2015-08-03 DIAGNOSIS — R627 Adult failure to thrive: Secondary | ICD-10-CM | POA: Diagnosis not present

## 2015-08-03 DIAGNOSIS — I69351 Hemiplegia and hemiparesis following cerebral infarction affecting right dominant side: Secondary | ICD-10-CM | POA: Diagnosis not present

## 2015-08-03 DIAGNOSIS — I10 Essential (primary) hypertension: Secondary | ICD-10-CM | POA: Diagnosis not present

## 2015-08-03 HISTORY — DX: Unspecified fall, initial encounter: W19.XXXA

## 2015-08-03 HISTORY — DX: Concussion with loss of consciousness status unknown, initial encounter: S06.0XAA

## 2015-08-03 HISTORY — DX: Concussion with loss of consciousness of unspecified duration, initial encounter: S06.0X9A

## 2015-08-03 LAB — CBC
HCT: 33.7 % — ABNORMAL LOW (ref 36.0–46.0)
Hemoglobin: 10.3 g/dL — ABNORMAL LOW (ref 12.0–15.0)
MCH: 29.2 pg (ref 26.0–34.0)
MCHC: 30.6 g/dL (ref 30.0–36.0)
MCV: 95.5 fL (ref 78.0–100.0)
Platelets: DECREASED 10*3/uL (ref 150–400)
RBC: 3.53 MIL/uL — ABNORMAL LOW (ref 3.87–5.11)
RDW: 13.2 % (ref 11.5–15.5)
WBC: 6.2 10*3/uL (ref 4.0–10.5)

## 2015-08-03 NOTE — Progress Notes (Signed)
Patient ID: [name] [name], female   DOB: 10/12/1945, 69 y.o.   MRN: [name] [id number]   LOS: 2 days   Subjective: Doing better. Some c/o blurry vision. A&Ox3.   Objective: Vital signs in last 24 hours: Temp:  [97.5 F (36.4 C)-98.4 F (36.9 C)] 97.5 F (36.4 C) (11/07 0603) Pulse Rate:  [69-98] 75 (11/07 0603) Resp:  [16] 16 (11/07 0603) BP: (97-138)/(44-56) 127/44 mmHg (11/07 0603) SpO2:  [90 %-99 %] 98 % (11/07 0603)    Laboratory  CBC  Recent Labs  08/02/15 0140 08/03/15 0540  WBC 7.1 6.2  HGB 10.4* 10.3*  HCT 33.8* 33.7*  PLT 123* PLATELET CLUMPS NOTED ON SMEAR, COUNT APPEARS DECREASED    Physical Exam General appearance: alert and no distress Eyes: Blurred vision in right visual field OD Resp: clear to auscultation bilaterally Cardio: regular rate and rhythm GI: normal findings: bowel sounds normal and soft, non-tender   Assessment/Plan: Fall -- Blurred vision likely 2/2 facial swelling, will refer to OP ophthalmology if it doesn't resolve by discharge C5 fx -- Collar for comfort per Dr. Bevely Palmeritty AMS -- MRI of brain stable, MS 2/2 concussion vs UTI ABL anemia -- Mild, stable ID -- UA c/w UTI, on Septra Mutliple medical problems -- Home meds FEN -- No issues VTE -- SCD's, Lovenox Dispo -- TBI team eval    Freeman CaldronMichael J. Jamareon Shimel, PA-C Pager: [phone] General Trauma PA Pager: [phone]  08/03/2015

## 2015-08-03 NOTE — Evaluation (Addendum)
Physical Therapy Evaluation Patient Details Name: [name] [name] MRN: [id number] DOB: 08/07/1946 Today's Date: 08/03/2015   History of Present Illness  Adm 11/5 s/p fall (bending over to pick up dog's leash she had dropped); developed AMS and brought to ED; +C5 endplate fx (c-collar for comfort only); MRI brain negative acute changes; +UTI  PMHx-CVAs with residual Rt weakness, COPD, OA, back surgery    Clinical Impression  Pt admitted with above diagnosis. Will have friend to stay with her 24/7 on discharge and several family members live very close. Pt currently with functional limitations due to the deficits listed below (see PT Problem List).  Pt will benefit from skilled PT to increase their independence and safety with mobility to allow discharge to the venue listed below.       Follow Up Recommendations Home health PT    Equipment Recommendations  None recommended by PT    Recommendations for Other Services OT consult;Speech consult     Precautions / Restrictions Precautions Precautions: Fall Precaution Comments: pt/daughter report all falls have occurred when bending over to reach low item Restrictions Weight Bearing Restrictions: No      Mobility  Bed Mobility                  Transfers Overall transfer level: Needs assistance Equipment used: 1 person hand held assist Transfers: Sit to/from Stand Sit to Stand: Min assist;Min guard         General transfer comment: min assist from regular height toilet; minguard from EOB x 2  Ambulation/Gait Ambulation/Gait assistance: Min guard Ambulation Distance (Feet): 25 Feet (rest; 80) Assistive device: None Gait Pattern/deviations: Step-through pattern;Decreased stride length;Wide base of support Gait velocity: very slow, could incr when requesting   General Gait Details: very slow with no UE swing or trunk rotation  Stairs            Wheelchair Mobility    Modified Rankin (Stroke Patients  Only)       Balance Overall balance assessment: History of Falls                                           Pertinent Vitals/Pain On RA at rest 95% On RA after ambulation 87% Resumed 3L O2 with incr to 90% in 60 seconds  Pain Assessment: Faces Faces Pain Scale: Hurts little more Pain Location: back Pain Descriptors / Indicators: Aching Pain Intervention(s): Limited activity within patient's tolerance;Monitored during session;Repositioned    Home Living Family/patient expects to be discharged to:: Private residence Living Arrangements: Alone Available Help at Discharge: Family;Friend(s);Available 24 hours/day (daughter lives next door; friend coming to stay with her) Type of Home: Mobile home Home Access: Stairs to enter (dtr reports working on ramp) Entrance Stairs-Rails: None Secretary/administrator of Steps: 2 Home Layout: One level Home Equipment: Other (comment) (life alert( but doesn't always wear it)) Additional Comments: daughter present to provide some details    Prior Function Level of Independence: Needs assistance   Gait / Transfers Assistance Needed: used cane at times for balance (thinks it's lost--recently moved)  ADL's / Homemaking Assistance Needed: can't tie shoes, opening cans, jars, etc, sometimes assist for bra, sometimes assist for shower        Hand Dominance   Dominant Hand: Right    Extremity/Trunk Assessment   Upper Extremity Assessment: Defer to OT evaluation  Lower Extremity Assessment: Generalized weakness (bil knee bruises)      Cervical / Trunk Assessment: Normal  Communication   Communication: No difficulties  Cognition Arousal/Alertness: Awake/alert Behavior During Therapy: Flat affect Overall Cognitive Status: Within Functional Limits for tasks assessed (oriented x 4)                      General Comments General comments (skin integrity, edema, etc.): Reinforced need to wear life  alert button; discussed obtaining reacher (pt had one previously which broke)    Exercises General Exercises - Lower Extremity Ankle Circles/Pumps: AROM;Both;10 reps Long Arc Quad: AROM;Both;10 reps      Assessment/Plan    PT Assessment Patient needs continued PT services  PT Diagnosis Difficulty walking   PT Problem List Decreased activity tolerance;Decreased balance;Decreased mobility;Decreased knowledge of use of DME;Pain;Obesity  PT Treatment Interventions DME instruction;Gait training;Stair training;Functional mobility training;Therapeutic activities;Therapeutic exercise;Balance training;Patient/family education   PT Goals (Current goals can be found in the Care Plan section) Acute Rehab PT Goals Patient Stated Goal: return home and be as independent as able  PT Goal Formulation: With patient Time For Goal Achievement: 08/07/15 Potential to Achieve Goals: Good    Frequency Min 3X/week   Barriers to discharge        Co-evaluation               End of Session Equipment Utilized During Treatment: Gait belt Activity Tolerance: Patient tolerated treatment well Patient left: in chair;with call bell/phone within reach;with chair alarm set;with family/visitor present Nurse Communication: Mobility status         Time: 1610-9604 PT Time Calculation (min) (ACUTE ONLY): 41 min   Charges:   PT Evaluation $Initial PT Evaluation Tier I: 1 Procedure PT Treatments $Gait Training: 23-37 mins   PT G Codes:        Artez Regis 2015/08/25, 9:28 AM Pager [phone]

## 2015-08-03 NOTE — Evaluation (Addendum)
Occupational Therapy Evaluation Patient Details Name: [name] [name] MRN: [id number] DOB: 1946-05-07 Today's Date: 08/03/2015    History of Present Illness Adm 11/5 s/p fall (bending over to pick up dog's leash she had dropped); developed AMS and brought to ED; +C5 endplate fx (c-collar for comfort only); MRI brain negative acute changes; +UTI  PMHx-CVAs with residual Rt weakness, COPD, OA, back surgery   Clinical Impression   Patient presenting with decreased independence with functional mobility and ADLs. Patient independent PTA. Patient currently functioning at an overall supervision to min assist level (without use of AD). Patient will benefit from acute OT to increase overall independence in the areas of ADLs, functional mobility, and overall safety in order to safely discharge home with 24/7 supervision/assistance. Pt reports that she has a family friend who can come stay with her    Follow Up Recommendations  No OT follow up;Supervision/Assistance - 24 hour    Equipment Recommendations  3 in 1 bedside comode    Recommendations for Other Services  None at this time  Precautions / Restrictions Precautions Precautions: Fall Precaution Comments: pt/daughter report all falls have occurred when bending over to reach low item Restrictions Weight Bearing Restrictions: No    Mobility Bed Mobility General bed mobility comments: Pt found seated in recliner upon entering/exiting room  Transfers Overall transfer level: Needs assistance Equipment used: 1 person hand held assist Transfers: Sit to/from Stand Sit to Stand: Min guard General transfer comment: Supervision to min guard. Pt took extra time to complete tasks and transfers. Pt had difficult time powering up from elevated toilet seat, needing to use grab bars and min guard assist from therapist for safety.     Balance Overall balance assessment: Needs assistance;History of Falls Sitting-balance support: No upper extremity  supported;Feet supported Sitting balance-Leahy Scale: Good     Standing balance support: No upper extremity supported;During functional activity Standing balance-Leahy Scale: Fair    ADL Overall ADL's : Needs assistance/impaired Eating/Feeding: Set up;Sitting   Grooming: Supervision/safety;Standing   Upper Body Bathing: Set up;Sitting   Lower Body Bathing: Min guard;Sit to/from stand Lower Body Bathing Details (indicate cue type and reason): pt may benefit from use of AE for overall safety with LB bathing Upper Body Dressing : Sitting;Set up   Lower Body Dressing: Min guard;Sit to/from stand Lower Body Dressing Details (indicate cue type and reason): pt may benefit from use of AE for overall safety with LB dressing Toilet Transfer: Min guard;Grab bars;BSC   Toileting- Clothing Manipulation and Hygiene: Supervision/safety;Sit to/from stand       Functional mobility during ADLs: Min guard;Cueing for safety General ADL Comments: Pt takes increased time to complete tasks. Pt used no AD for functional ambulation. Pt able to cross BLEs for LB ADLs. Pt with history of falls and reports most falls occur when she is bending over to do something (how this most recent fall occured). Encouraged pt to use reacher to pick items up off floor and encouraged pt to wear life alert all the time.      Vision Additional Comments: Pt reports blurring of vision when looking to right visiual field. Will continue to assess this in functional context/setting. May bring in eye patch if needed.           Pertinent Vitals/Pain Pain Assessment: No/denies pain Faces Pain Scale: Hurts little more Pain Location: back Pain Descriptors / Indicators: Aching Pain Intervention(s): Limited activity within patient's tolerance;Monitored during session;Repositioned    Hand Dominance Right   Extremity/Trunk  Assessment Upper Extremity Assessment Upper Extremity Assessment: Overall WFL for tasks assessed   Lower  Extremity Assessment Lower Extremity Assessment: Defer to PT evaluation   Cervical / Trunk Assessment Cervical / Trunk Assessment: Normal   Communication Communication Communication: No difficulties   Cognition Arousal/Alertness: Awake/alert Behavior During Therapy: Flat affect Overall Cognitive Status: Within Functional Limits for tasks assessed (oriented X4, however some higher level cognition deficts noted)      Exercises Exercises: General Lower Extremity          Home Living Family/patient expects to be discharged to:: Private residence Living Arrangements: Alone Available Help at Discharge: Family;Friend(s);Available 24 hours/day (daughter lives next door, friend coming over to stay with her "for as long as needed") Type of Home: Mobile home Home Access: Stairs to enter (according to PT note, daughter reports they are working on ramp) Secretary/administrator of Steps: 2 Entrance Stairs-Rails: None Home Layout: One level     Bathroom Shower/Tub: Walk-in Pensions consultant: Pharmacist, community: Yes   Home Equipment: Other (comment) (life alert, but doesn't always wear it)   Additional Comments: daughter present to provide some details      Prior Functioning/Environment Level of Independence: Needs assistance  Gait / Transfers Assistance Needed: used cane at times for balance (thinks it's lost--recently moved) ADL's / Homemaking Assistance Needed: can't tie shoes, opening cans, jars, etc, sometimes assist for bra, sometimes assist for shower    OT Diagnosis: Generalized weakness;Disturbance of vision   OT Problem List: Decreased strength;Decreased activity tolerance;Impaired balance (sitting and/or standing);Impaired vision/perception;Decreased safety awareness;Decreased knowledge of use of DME or AE   OT Treatment/Interventions: Self-care/ADL training;Therapeutic exercise;Energy conservation;DME and/or AE instruction;Therapeutic  activities;Patient/family education;Balance training    OT Goals(Current goals can be found in the care plan section) Acute Rehab OT Goals Patient Stated Goal: return home and be as independent as able  OT Goal Formulation: With patient Time For Goal Achievement: 08/17/15 Potential to Achieve Goals: Good ADL Goals Pt Will Perform Grooming: Independently;standing Pt Will Perform Lower Body Bathing: with modified independence;sit to/from stand (using AE prn for safety) Pt Will Perform Lower Body Dressing: with modified independence;sit to/from stand (using AE prn for safety) Pt Will Perform Tub/Shower Transfer: Shower transfer;3 in 1;with modified independence;ambulating Additional ADL Goal #1: Pt will be educated on vision exercises or use of patch prn to prevent/decrease blurring of vision during functional tasks  OT Frequency: Min 2X/week   Barriers to D/C: None known at this time   End of Session Equipment Utilized During Treatment: Oxygen (at beginning and end of session)  Activity Tolerance: Patient tolerated treatment well Patient left: in chair;with call bell/phone within reach;with chair alarm set   Time: 1610-9604 OT Time Calculation (min): 18 min Charges:  OT General Charges $OT Visit: 1 Procedure OT Evaluation $Initial OT Evaluation Tier I: 1 Procedure  Acy Orsak , MS, OTR/L, CLT 08/03/2015, 11:08 AM

## 2015-08-03 NOTE — Evaluation (Signed)
Speech Language Pathology Evaluation Patient Details Name: [name] [name] MRN: [id number] DOB: 1946-04-03 Today's Date: 08/03/2015 Time: 2841-3244 SLP Time Calculation (min) (ACUTE ONLY): 20 min  Problem List:  Patient Active Problem List   Diagnosis Date Noted  . Fall 08/03/2015  . Concussion 08/03/2015  . Facial contusion 08/03/2015  . UTI (urinary tract infection) 08/03/2015  . Acute blood loss anemia 08/03/2015  . Mental status change 08/01/2015  . Dysphagia 03/19/2015  . Advanced care planning/counseling discussion 01/29/2015  . Health maintenance examination 01/29/2015  . Headache 01/02/2015  . Carotid stenosis 04/03/2014  . Carotid artery aneurysm (HCC) 04/03/2014  . Recurrent falls 01/02/2014  . Hemiparesis affecting right side as late effect of stroke (HCC) 01/02/2014  . Insomnia 01/10/2013  . Medicare annual wellness visit, subsequent 11/26/2012  . Hypertension   . History of stroke   . Peripheral vascular disease (HCC)   . COPD (chronic obstructive pulmonary disease) (HCC)   . Hyperlipidemia   . Osteoarthritis   . MDD (major depressive disorder), recurrent episode, moderate (HCC)   . Ex-smoker   . Osteoporosis 11/25/2010   Past Medical History:  Past Medical History  Diagnosis Date  . Hypertension   . Stroke [facility]) 2010    x3 with residual R hemiparesis, s/p R MCA balloon angioplasty (2010)  . Peripheral vascular disease (HCC)   . COPD (chronic obstructive pulmonary disease) (HCC) 12/2012    spirometry: Pre: FVC 84%, FEV1 69%, ratio 0.64 consistent with moderate obstruction.  . Hyperlipidemia   . Osteoarthritis     h/o ruptured disc s/p ESI  . Depression   . Smoker     quit 10/2012  . History of chicken pox   . Carotid stenosis     R 50% (12/2012)  . Lower back pain     h/o HNP s/p surgery  . Osteoporosis 11/2010    DEXA -2.7 spine, thoracic compression fracture  . Aneurysm [facility])    Past Surgical History:  Past Surgical History  Procedure  Laterality Date  . Cholecystectomy  1970's  . Appendectomy  1960's  . Cesarean section    . Cataract extraction      bilateral  . L hip surgery  2006    fractured - screws placed  . Dexa  11/2010    T -2.7 spine, -1.9 hip   HPI:  Patient admitted 11/5 s/p fall due to bending over to pick up dog's leash that she had dropped. She developed AMS and was brought to the ED; +C5 endplate fx (c-collar for comfort only); MRI brain negative acute changes; +UTI  PMHx if CVAs with residual Rt weakness, COPD, OA, and back surgery.     Assessment / Plan / Recommendation Clinical Impression  Cognitive-linguistic evaluation complete. Patient reports some memory slips at baseline and she had decided not to drive anymore; however, she was managing medications and finances independently.  Patient demonstrates mild higher level cognitive deficits that are characterized by difficulty with selective attention, storage and retrieval of new information, reasoning and math calculations.  Given that there was no family present to confirm baseline abilities, patient would benefit from ongoing diagnostic treatment while here to rule out new deficits and ensure safety upon discharge home with 24/7 supervision; patient in agreement.     SLP Assessment  Patient needs continued Speech Lanaguage Pathology Services    Follow Up Recommendations  24 hour supervision/assistance    Frequency and Duration min 2x/week  1 week   Pertinent Vitals/Pain Pain Assessment:  No/denies pain Faces Pain Scale: Hurts little more Pain Location: back Pain Descriptors / Indicators: Aching Pain Intervention(s): Limited activity within patient's tolerance;Monitored during session;Repositioned   SLP Goals  Progression toward goals:  (Eval ) Patient/Family Stated Goal: none stated Potential to Achieve Goals (ACUTE ONLY): Good Potential Considerations (ACUTE ONLY): Ability to learn/carryover information  SLP Evaluation Prior  Functioning  Cognitive/Linguistic Baseline: Baseline deficits Baseline deficit details: reports some memory slips and stated that she no longer drove Type of Home: Mobile home  Lives With: Alone Available Help at Discharge: Family;Friend(s);Available 24 hours/day Vocation: Retired   IT consultant  Overall Cognitive Status: No family/caregiver present to determine baseline cognitive functioning Arousal/Alertness: Awake/alert Orientation Level: Oriented X4 (with extra time) Attention: Selective;Sustained Sustained Attention: Appears intact Selective Attention: Impaired Selective Attention Impairment: Verbal basic;Functional basic Memory: Impaired Memory Impairment: Storage deficit;Retrieval deficit;Decreased recall of new information Awareness: Appears intact Problem Solving: Impaired Problem Solving Impairment: Verbal complex;Functional complex Executive Function: Reasoning;Self Monitoring;Self Correcting Reasoning: Impaired Reasoning Impairment: Verbal complex;Functional complex Self Monitoring: Impaired Self Monitoring Impairment: Functional complex Self Correcting: Impaired Self Correcting Impairment: Functional complex Safety/Judgment: Appears intact [employer] Scales of Cognitive Functioning:  (will continue to monitor )    Comprehension  Auditory Comprehension Overall Auditory Comprehension: Impaired Yes/No Questions: Within Functional Limits Commands: Impaired Multistep Basic Commands: 50-74% accurate Conversation: Simple Interfering Components: Working Radio broadcast assistant: Technical brewer Discrimination: Within Function Limits Reading Comprehension Reading Status: Within funtional limits (with basic info)    Expression Expression Primary Mode of Expression: Verbal Verbal Expression Overall Verbal Expression: Appears within functional limits for tasks assessed Written  Expression Dominant Hand: Right Written Expression: Not tested   Oral / Motor Oral Motor/Sensory Function Overall Oral Motor/Sensory Function: Appears within functional limits for tasks assessed Motor Speech Overall Motor Speech: Appears within functional limits for tasks assessed   GO    Charlane Ferretti., CCC-SLP [phone]  Cameo Schmiesing 08/03/2015, 11:21 AM

## 2015-08-04 LAB — URINE CULTURE: Culture: >=100000

## 2015-08-04 MED ORDER — NAPROXEN 500 MG PO TABS
500.0000 mg | ORAL_TABLET | Freq: Two times a day (BID) | ORAL | Status: DC
Start: 2015-08-04 — End: 2015-08-06

## 2015-08-04 MED ORDER — TRAMADOL HCL 50 MG PO TABS: 50.0000 mg | ORAL_TABLET | Freq: Four times a day (QID) | ORAL | Status: DC | PRN

## 2015-08-04 MED ORDER — SULFAMETHOXAZOLE-TRIMETHOPRIM 800-160 MG PO TABS: 1.0000 | ORAL_TABLET | Freq: Two times a day (BID) | ORAL | Status: DC

## 2015-08-04 NOTE — Care Management Important Message (Signed)
Important Message  Patient Details  Name: [name] McgregorHattie S Krizan MRN: [id number] Date of Birth: 1946-07-05   Medicare Important Message Given:  Yes-second notification given    Orson AloeMegan P Nashla Althoff 08/04/2015, 1:41 PM

## 2015-08-04 NOTE — Progress Notes (Signed)
Pt discharged home with daughter and home health. IV removed and discharge instructions given. Left via wheelchair with volunteer services at 1215. Lawson RadarHeather M Jacquette Canales

## 2015-08-04 NOTE — Care Management Note (Signed)
Case Management Note  Patient Details  Name: Elray McgregorHattie S Maggio MRN: [id number] Date of Birth: August 05, 1946  Subjective/Objective:    Pt admitted on 08/01/15 s/p fall with concussion, C5 fx, facial bruising.  PTA, pt independent, lives alone.  Daughter lives next door to her.  PT recommending HH follow up with 24h supervision initially.                 Action/Plan: Pt has arranged for friend to stay with her at dc.  Arranged for HHPT follow up at home with [facility], per pt choice.  Pt requests rollator, BSC, and shower seat for home.  [facility]    Expected Discharge Date:   08/04/15               Expected Discharge Plan:  Home w Home Health Services  In-House Referral:     Discharge planning Services  CM Consult  Post Acute Care Choice:    Choice offered to:  Patient  DME Arranged:  3-N-1, Walker rolling with seat, Shower stool DME Agency:  [facility]  HH Arranged:  PT HH Agency:  [facility]  Status of Service:  Completed, signed off  Medicare Important Message Given:    Date Medicare IM Given:    Medicare IM give by:    Date Additional Medicare IM Given:    Additional Medicare Important Message give by:     If discussed at Long Length of Stay Meetings, dates discussed:    Additional Comments:  Quintella BatonJulie W. Jonavon Trieu, RN, BSN  Trauma/Neuro ICU Case Manager [phone]

## 2015-08-04 NOTE — Progress Notes (Signed)
Patient ID: [name] McgregorHattie S [name], female   DOB: 05/05/1946, 69 y.o.   MRN: [id number]   LOS: 3 days   Subjective: Doing well, ready to go home. Vision improved.   Objective: Vital signs in last 24 hours: Temp:  [97.3 F (36.3 C)-98.7 F (37.1 C)] 98.4 F (36.9 C) (11/08 0954) Pulse Rate:  [64-78] 78 (11/08 0954) Resp:  [15-18] 15 (11/08 0954) BP: (109-155)/(46-66) 136/66 mmHg (11/08 0954) SpO2:  [94 %-99 %] 99 % (11/08 0954)    Physical Exam General appearance: alert and no distress Resp: clear to auscultation bilaterally Cardio: regular rate and rhythm GI: normal findings: bowel sounds normal and soft, non-tender   Assessment/Plan: Fall -- Blurred vision likely 2/2 facial swelling, will see ophthalmology if it doesn't resolve completely C5 fx -- Collar for comfort per Dr. Bevely Palmeritty AMS -- MRI of brain stable, MS 2/2 concussion vs UTI ABL anemia -- Mild, stable ID -- UA c/w UTI, on Septra Mutliple medical problems -- Home meds Dispo -- D/C home    Freeman CaldronMichael J. Shanyiah Conde, PA-C Pager: [phone] General Trauma PA Pager: [phone]  08/04/2015

## 2015-08-04 NOTE — Discharge Summary (Signed)
Physician Discharge Summary  Patient ID: [name] [name] MRN: [id number] DOB/AGE: 1946-07-12 69 y.o.  Admit date: 08/01/2015 Discharge date: 08/04/2015  Discharge Diagnoses Patient Active Problem List   Diagnosis Date Noted  . Fall 08/03/2015  . Concussion 08/03/2015  . Facial contusion 08/03/2015  . UTI (urinary tract infection) 08/03/2015  . Acute blood loss anemia 08/03/2015  . Mental status change 08/01/2015  . Dysphagia 03/19/2015  . Advanced care planning/counseling discussion 01/29/2015  . Health maintenance examination 01/29/2015  . Headache 01/02/2015  . Carotid stenosis 04/03/2014  . Carotid artery aneurysm (HCC) 04/03/2014  . Recurrent falls 01/02/2014  . Hemiparesis affecting right side as late effect of stroke (HCC) 01/02/2014  . Insomnia 01/10/2013  . Medicare annual wellness visit, subsequent 11/26/2012  . Hypertension   . History of stroke   . Peripheral vascular disease (HCC)   . COPD (chronic obstructive pulmonary disease) (HCC)   . Hyperlipidemia   . Osteoarthritis   . MDD (major depressive disorder), recurrent episode, moderate (HCC)   . Ex-smoker   . Osteoporosis 11/25/2010    Consultants Dr. Romeo AppleBen Ditty for neurosurgery   Procedures None   HPI: Trauma was asked to see [name] [name] at the request of the emergency room physician due to mental status changes after a fall the day prior. The patient was walking her dog and fell on her right side of her face. She was on Plavix. She was able to get up and call her daughters to take her to the hospital. It took over an hour for her to get back in the house according to her daughter. The patient was evaluated with a head CT, face CT and a CT of her cervical spine since she was complaining of neck pain. Initially the cervical CT was read as normal but then the radiologist created an addendum and stated the patient had a C5 fracture. The patient was placed in a hard collar. She was able to walk around and was sent  home from the ED. Her head CT was normal. The patient's daughter stated that her mother had been more somnolent. She stated that she had not been able to chew her food very easily and was more sluggish than her baseline. Walking had been more difficult as well. She did have some mild baseline right-sided hemiparesis from previous stroke. She was also complaining of abdominal pain left greater than right which was new today. She had some transient mild hypotension in the emergency room that responded to normal saline bolus. Repeat CT scans were still negative including a new abdominal and pelvic CT. She was admitted to the trauma service.   Hospital Course: A urinalysis was suggestive of a UTI and she was treated empirically for that. She was evaluated by the TBI therapy team who recommended home with 24-hour assistance. She did better over her short stay here. It was thought that her failure to thrive was likely secondary to her concussion or the UTI or some combination of the two. She was discharged home in good condition.     Medication List    STOP taking these medications        oxyCODONE-acetaminophen 5-325 MG tablet  Commonly known as:  PERCOCET/ROXICET      TAKE these medications        albuterol 108 (90 BASE) MCG/ACT inhaler  Commonly known as:  PROVENTIL HFA;VENTOLIN HFA  Inhale 2 puffs into the lungs every 6 (six) hours as needed for wheezing or shortness of breath.  alendronate 70 MG tablet  Commonly known as:  FOSAMAX  TAKE 1 TABLET WEEKLY. TAKE WITH A FULL GLASS OF WATER ON AN EMPTY STOMACH.     aspirin EC 81 MG tablet  Take 81 mg by mouth daily.     atorvastatin 40 MG tablet  Commonly known as:  LIPITOR  TAKE 1 TABLET EVERY DAY     benazepril 10 MG tablet  Commonly known as:  LOTENSIN  Take 1 tablet (10 mg total) by mouth daily.     Calcium-Vitamin D 600-400 MG-UNIT Tabs  Take 1 tablet by mouth daily.     clopidogrel 75 MG tablet  Commonly known as:  PLAVIX   TAKE 1 TABLET EVERY DAY     fexofenadine 180 MG tablet  Commonly known as:  ALLEGRA  Take 180 mg by mouth daily.     naproxen 500 MG tablet  Commonly known as:  NAPROSYN  Take 1 tablet (500 mg total) by mouth 2 (two) times daily with a meal.     sertraline 100 MG tablet  Commonly known as:  ZOLOFT  Take 1 tablet (100 mg total) by mouth daily.     sulfamethoxazole-trimethoprim 800-160 MG tablet  Commonly known as:  BACTRIM DS,SEPTRA DS  Take 1 tablet by mouth every 12 (twelve) hours.     traMADol 50 MG tablet  Commonly known as:  ULTRAM  Take 1-2 tablets (50-100 mg total) by mouth every 6 (six) hours as needed (Pain).     traZODone 100 MG tablet  Commonly known as:  DESYREL  TAKE 1 TABLET AT BEDTIME AS NEEDED FOR SLEEP            Follow-up Information    Schedule an appointment as soon as possible for a visit with [name], MD.   Specialty:  Family Medicine   Contact information:   [address] [place] [address] [phone]       Call [facility] TRAUMA SERVICE.   Why:  As needed   Contact information:   [address] [id number] mc [place] [address] [phone]       Signed: Freeman Caldron, PA-C Pager: [phone] General Trauma PA Pager: [phone] 08/04/2015, 10:20 AM

## 2015-08-05 ENCOUNTER — Other Ambulatory Visit: Payer: Self-pay | Admitting: *Deleted

## 2015-08-05 ENCOUNTER — Telehealth: Payer: Self-pay | Admitting: *Deleted

## 2015-08-05 DIAGNOSIS — N390 Urinary tract infection, site not specified: Secondary | ICD-10-CM | POA: Diagnosis not present

## 2015-08-05 DIAGNOSIS — F329 Major depressive disorder, single episode, unspecified: Secondary | ICD-10-CM | POA: Diagnosis not present

## 2015-08-05 DIAGNOSIS — S32059D Unspecified fracture of fifth lumbar vertebra, subsequent encounter for fracture with routine healing: Secondary | ICD-10-CM | POA: Diagnosis not present

## 2015-08-05 DIAGNOSIS — J449 Chronic obstructive pulmonary disease, unspecified: Secondary | ICD-10-CM | POA: Diagnosis not present

## 2015-08-05 DIAGNOSIS — M810 Age-related osteoporosis without current pathological fracture: Secondary | ICD-10-CM | POA: Diagnosis not present

## 2015-08-05 DIAGNOSIS — S060X0D Concussion without loss of consciousness, subsequent encounter: Secondary | ICD-10-CM | POA: Diagnosis not present

## 2015-08-05 DIAGNOSIS — I739 Peripheral vascular disease, unspecified: Secondary | ICD-10-CM | POA: Diagnosis not present

## 2015-08-05 DIAGNOSIS — I10 Essential (primary) hypertension: Secondary | ICD-10-CM | POA: Diagnosis not present

## 2015-08-05 DIAGNOSIS — S0083XD Contusion of other part of head, subsequent encounter: Secondary | ICD-10-CM | POA: Diagnosis not present

## 2015-08-05 MED ORDER — SULFAMETHOXAZOLE-TRIMETHOPRIM 800-160 MG PO TABS: 1.0000 | ORAL_TABLET | Freq: Two times a day (BID) | ORAL | Status: DC

## 2015-08-05 NOTE — Telephone Encounter (Signed)
Transition Care Management Follow-up Telephone Call   Date discharged? 08/04/15   How have you been since you were released from the hospital? Slight improvement, feeling well   Do you understand why you were in the hospital? yes   Do you understand the discharge instructions? yes   Where were you discharged to? Home   Items Reviewed:  Medications reviewed: yes  Allergies reviewed: yes  Dietary changes reviewed: no  Referrals reviewed: yes, in home PT   Functional Questionnaire:   Activities of Daily Living (ADLs):   She states they are independent in the following: ambulation, feeding, continence, grooming, toileting and dressing States they require assistance with the following: bathing and hygiene   Any transportation issues/concerns?: no   Any patient concerns? no   Confirmed importance and date/time of follow-up visits scheduled yes, 08/06/15 @ 1215   Provider Appointment booked with Eustaquio BoydenJavier Gutierrez, MD  Confirmed with patient if condition begins to worsen call PCP or go to the ER.  Patient was given the office number and encouraged to call back with question or concerns.  : yes

## 2015-08-05 NOTE — Telephone Encounter (Signed)
Patient's daughter called requesting antibiotic that was prescribed in the hospital [facility] [address]  This was sent to mail order in error.

## 2015-08-06 ENCOUNTER — Encounter: Payer: Self-pay | Admitting: Family Medicine

## 2015-08-06 ENCOUNTER — Ambulatory Visit (INDEPENDENT_AMBULATORY_CARE_PROVIDER_SITE_OTHER): Payer: Commercial Managed Care - HMO | Admitting: Family Medicine

## 2015-08-06 VITALS — BP 112/70 | HR 87 | Temp 98.4°F | Wt 172.8 lb

## 2015-08-06 DIAGNOSIS — Z23 Encounter for immunization: Secondary | ICD-10-CM

## 2015-08-06 DIAGNOSIS — S12401D Unspecified nondisplaced fracture of fifth cervical vertebra, subsequent encounter for fracture with routine healing: Secondary | ICD-10-CM | POA: Diagnosis not present

## 2015-08-06 DIAGNOSIS — S12400A Unspecified displaced fracture of fifth cervical vertebra, initial encounter for closed fracture: Secondary | ICD-10-CM | POA: Insufficient documentation

## 2015-08-06 DIAGNOSIS — S0083XD Contusion of other part of head, subsequent encounter: Secondary | ICD-10-CM

## 2015-08-06 DIAGNOSIS — S060X0D Concussion without loss of consciousness, subsequent encounter: Secondary | ICD-10-CM

## 2015-08-06 DIAGNOSIS — N3000 Acute cystitis without hematuria: Secondary | ICD-10-CM

## 2015-08-06 DIAGNOSIS — D62 Acute posthemorrhagic anemia: Secondary | ICD-10-CM

## 2015-08-06 DIAGNOSIS — R7989 Other specified abnormal findings of blood chemistry: Secondary | ICD-10-CM

## 2015-08-06 DIAGNOSIS — N179 Acute kidney failure, unspecified: Secondary | ICD-10-CM

## 2015-08-06 DIAGNOSIS — R110 Nausea: Secondary | ICD-10-CM | POA: Insufficient documentation

## 2015-08-06 DIAGNOSIS — W19XXXD Unspecified fall, subsequent encounter: Secondary | ICD-10-CM

## 2015-08-06 DIAGNOSIS — R112 Nausea with vomiting, unspecified: Secondary | ICD-10-CM

## 2015-08-06 DIAGNOSIS — R778 Other specified abnormalities of plasma proteins: Secondary | ICD-10-CM

## 2015-08-06 HISTORY — DX: Unspecified displaced fracture of fifth cervical vertebra, initial encounter for closed fracture: S12.400A

## 2015-08-06 NOTE — Assessment & Plan Note (Signed)
No significant headache or persistent altered cognition.

## 2015-08-06 NOTE — Patient Instructions (Addendum)
Tdap today (tetanus and pertussis). Hold fosamax for next several weeks. Return in 2 weeks for repeat labwork. Let us know if any chest pain or worsening shortness of breath develops. For pain/soreness - start tylenol 500mg  twice daily with meals as needed. May use tramadol for breakthrough pain. Look into taking 1/2 - 1 can ensure or boost daily.  Return to see me in 1-2 months for follow up

## 2015-08-06 NOTE — Assessment & Plan Note (Addendum)
Suffered fall with C5 fracture, facial contusion without fracture, R arm abrasions, found to have ARF (now resolved), transaminitis, anemia, hypocalcemia and hypoalbuminemia. Discussed importance of good protein intake - rec ensure daily. Recheck labs in 2 wks. Bump in TnI attributed to ARF - recheck at f/u labs.

## 2015-08-06 NOTE — Progress Notes (Signed)
BP 112/70 mmHg  Pulse 87  Temp(Src) 98.4 F (36.9 C) (Oral)  Wt 172 lb 12 oz (78.359 kg)  SpO2 98%   CC: TCM visit Subjective:    Patient ID: [name] [name], female    DOB: 1946/03/15, 69 y.o.   MRN: [id number]  HPI: [name] [name] is a 69 y.o. female presenting on 08/06/2015 for Hospitalization Follow-up   Presents with daughter who is neighbor.  Recent hospitalization after fall suffered at home with C5 vertebral fracture, concussion and facial bruising. Fell down 2 steps on front porch when putting leash on puppy. Hit forehead. Discharged after initial ER evaluation, returned and admitted next day due to weakness, AMS, somnolence, confusion, hypotension. ?percocet vs dehydration related. Known baseline R sided hemiparesis from prior CVA. Found to have >100k pansens Ecoli UTI - treated with bactrim. rec cervical collar for comfort, not needing currently. Now mainly staying sore throughout her body but no localizing pain. Not really using tramadol.   Also found to have anemia, transaminitis, mild ARF which resolved prior to discharge, hypocalcemia and hypoalbuminemia, and TnI elevation to 0.15 (thought to be related to ARF). Pt denies chest pain, dyspnea. Staying nauseated with meals. Heartburn in hospital treated with GI cocktail.   Discharged with [facility] services (PT with [facility])and recommendation for 24 hr supervision. Has daughter, niece, and friend who stay with her.   Some R blurry vision initially. This is improving per patient.   F/u phone call: 08/05/2015 Admit date: 08/01/2015 Discharge date: 08/04/2015  Discharge Diagnoses Patient Active Problem List   Diagnosis Date Noted  . Fall 08/03/2015  . Concussion 08/03/2015  . Facial contusion 08/03/2015  . UTI (urinary tract infection) 08/03/2015  . Acute blood loss anemia 08/03/2015  . Mental status change 08/01/2015  . Dysphagia 03/19/2015  . Advanced care planning/counseling  discussion 01/29/2015  . Health maintenance examination 01/29/2015  . Headache 01/02/2015  . Carotid stenosis 04/03/2014  . Carotid artery aneurysm (HCC) 04/03/2014  . Recurrent falls 01/02/2014  . Hemiparesis affecting right side as late effect of stroke (HCC) 01/02/2014  . Insomnia 01/10/2013  . Medicare annual wellness visit, subsequent 11/26/2012  . Hypertension   . History of stroke   . Peripheral vascular disease (HCC)   . COPD (chronic obstructive pulmonary disease) (HCC)   . Hyperlipidemia   . Osteoarthritis   . MDD (major depressive disorder), recurrent episode, moderate (HCC)   . Ex-smoker   . Osteoporosis 11/25/2010    Consultants Dr. Romeo Apple Ditty for neurosurgery     Relevant past medical, surgical, family and social history reviewed and updated as indicated. Interim medical history since our last visit reviewed. Allergies and medications reviewed and updated. Current Outpatient Prescriptions on File Prior to Visit  Medication Sig  . albuterol (PROVENTIL HFA;VENTOLIN HFA) 108 (90 BASE) MCG/ACT inhaler Inhale 2 puffs into the lungs every 6 (six) hours as needed for wheezing or shortness of breath.   [name] alendronate (FOSAMAX) 70 MG tablet TAKE 1 TABLET WEEKLY. TAKE WITH A FULL GLASS OF WATER ON AN EMPTY STOMACH. (Patient taking differently: TAKE 1 TABLET WEEKLY ON TUESDAYS -TAKE WITH A FULL GLASS OF WATER ON AN EMPTY STOMACH.)  . aspirin EC 81 MG tablet Take 81 mg by mouth daily.  [name] atorvastatin (LIPITOR) 40 MG tablet TAKE 1 TABLET EVERY DAY  . benazepril (LOTENSIN) 10 MG tablet Take 1 tablet (10 mg total) by mouth daily.  . Calcium Carb-Cholecalciferol (CALCIUM-VITAMIN D) 600-400 MG-UNIT TABS Take 1 tablet  by mouth daily.  . clopidogrel (PLAVIX) 75 MG tablet TAKE 1 TABLET EVERY DAY  . fexofenadine (ALLEGRA) 180 MG tablet Take 180 mg by mouth daily.  . sertraline (ZOLOFT) 100 MG tablet Take 1 tablet (100 mg total) by mouth  daily.  [name]. sulfamethoxazole-trimethoprim (BACTRIM DS,SEPTRA DS) 800-160 MG tablet Take 1 tablet by mouth every 12 (twelve) hours.  . traMADol (ULTRAM) 50 MG tablet Take 1-2 tablets (50-100 mg total) by mouth every 6 (six) hours as needed (Pain). (Patient taking differently: Take 50 mg by mouth 3 (three) times daily as needed (Pain). )  . traZODone (DESYREL) 100 MG tablet TAKE 1 TABLET AT BEDTIME AS NEEDED FOR SLEEP   No current facility-administered medications on file prior to visit.    Review of Systems Per HPI unless specifically indicated in ROS section     Objective:    BP 112/70 mmHg  Pulse 87  Temp(Src) 98.4 F (36.9 C) (Oral)  Wt 172 lb 12 oz (78.359 kg)  SpO2 98%  Wt Readings from Last 3 Encounters:  08/06/15 172 lb 12 oz (78.359 kg)  08/01/15 176 lb 4.8 oz (79.969 kg)  03/19/15 173 lb 4 oz (78.586 kg)    Physical Exam  Constitutional: She appears well-developed and well-nourished. No distress.  HENT:  Head: Head is with contusion.    Mouth/Throat: Oropharynx is clear and moist. No oropharyngeal exudate.  Bruising R side of face. No point tenderness  Neck:  Tender to palpation midline spine at C5  Cardiovascular: Normal rate, regular rhythm, normal heart sounds and intact distal pulses.   No murmur heard. Pulmonary/Chest: Effort normal and breath sounds normal. No respiratory distress. She has no wheezes. She has no rales.  Musculoskeletal: She exhibits no edema.  Lymphadenopathy:    She has no cervical adenopathy.  Skin: Skin is warm and dry. Abrasion, bruising and ecchymosis noted.     Bruising throughout all extremities Several abrasions/wounds R forearm and upper arm - dressed with abx ointment and nonstick gauze and kerlex. Wound care discussed.  Psychiatric:  Flattened affect  Nursing note and vitals reviewed.       Assessment & Plan:   Problem List Items Addressed This Visit    UTI (urinary tract infection)    Finishing bactrim abx. Asxs.         Nausea without vomiting    Now with some nausea - likely due to combination of bactrim and tramadol. Monitor for improvement as she completes bactrim course. Update if persistent for further evaluation.      Relevant Orders   Comprehensive metabolic panel   Fracture of cervical vertebra, C5 (HCC) - Primary    Stable C5 anterior inferior endplate fracture, evaluated by neurosurgery, rec cervical collar for comfort. Pt no longer wearing. Discussed start with tylenol analgesia and use tramadol for breakthrough pain only. Update if not improving with treatment.      Fall    Suffered fall with C5 fracture, facial contusion without fracture, R arm abrasions, found to have ARF (now resolved), transaminitis, anemia, hypocalcemia and hypoalbuminemia. Discussed importance of good protein intake - rec ensure daily. Recheck labs in 2 wks. Bump in TnI attributed to ARF - recheck at f/u labs.      Facial contusion    On blood thinner, no persistent bruising/bleeding apparent.      Concussion    No significant headache or persistent altered cognition.      Acute blood loss anemia    Recheck CBC in  2 wks.       Relevant Orders   CBC with Differential/Platelet    Other Visit Diagnoses    Acute renal failure, unspecified acute renal failure type [facility])        Relevant Orders    Comprehensive metabolic panel    Elevated troponin        Relevant Orders    Troponin I        Follow up plan: Return in about 2 months (around 10/06/2015), or as needed, for follow up visit.

## 2015-08-06 NOTE — Assessment & Plan Note (Signed)
Recheck CBC in 2 wks.

## 2015-08-06 NOTE — Assessment & Plan Note (Signed)
Stable C5 anterior inferior endplate fracture, evaluated by neurosurgery, rec cervical collar for comfort. Pt no longer wearing. Discussed start with tylenol analgesia and use tramadol for breakthrough pain only. Update if not improving with treatment.

## 2015-08-06 NOTE — Addendum Note (Signed)
Addended by: Shon MilletWATLINGTON, Gautham Hewins M on: 08/06/2015 01:18 PM   Modules accepted: Orders

## 2015-08-06 NOTE — Progress Notes (Signed)
Pre visit review using our clinic review tool, if applicable. No additional management support is needed unless otherwise documented below in the visit note. 

## 2015-08-06 NOTE — Assessment & Plan Note (Addendum)
Now with some nausea - likely due to combination of bactrim and tramadol. Monitor for improvement as she completes bactrim course. Update if persistent for further evaluation.

## 2015-08-06 NOTE — Assessment & Plan Note (Signed)
On blood thinner, no persistent bruising/bleeding apparent.

## 2015-08-06 NOTE — Assessment & Plan Note (Signed)
Finishing bactrim abx. Asxs.

## 2015-08-10 DIAGNOSIS — R269 Unspecified abnormalities of gait and mobility: Secondary | ICD-10-CM | POA: Diagnosis not present

## 2015-08-11 DIAGNOSIS — M810 Age-related osteoporosis without current pathological fracture: Secondary | ICD-10-CM | POA: Diagnosis not present

## 2015-08-11 DIAGNOSIS — S060X0D Concussion without loss of consciousness, subsequent encounter: Secondary | ICD-10-CM | POA: Diagnosis not present

## 2015-08-11 DIAGNOSIS — I739 Peripheral vascular disease, unspecified: Secondary | ICD-10-CM | POA: Diagnosis not present

## 2015-08-11 DIAGNOSIS — I10 Essential (primary) hypertension: Secondary | ICD-10-CM | POA: Diagnosis not present

## 2015-08-11 DIAGNOSIS — J449 Chronic obstructive pulmonary disease, unspecified: Secondary | ICD-10-CM | POA: Diagnosis not present

## 2015-08-11 DIAGNOSIS — S32059D Unspecified fracture of fifth lumbar vertebra, subsequent encounter for fracture with routine healing: Secondary | ICD-10-CM | POA: Diagnosis not present

## 2015-08-11 DIAGNOSIS — S0083XD Contusion of other part of head, subsequent encounter: Secondary | ICD-10-CM | POA: Diagnosis not present

## 2015-08-11 DIAGNOSIS — N390 Urinary tract infection, site not specified: Secondary | ICD-10-CM | POA: Diagnosis not present

## 2015-08-11 DIAGNOSIS — F329 Major depressive disorder, single episode, unspecified: Secondary | ICD-10-CM | POA: Diagnosis not present

## 2015-08-13 DIAGNOSIS — N390 Urinary tract infection, site not specified: Secondary | ICD-10-CM | POA: Diagnosis not present

## 2015-08-13 DIAGNOSIS — S0083XD Contusion of other part of head, subsequent encounter: Secondary | ICD-10-CM | POA: Diagnosis not present

## 2015-08-13 DIAGNOSIS — S060X0D Concussion without loss of consciousness, subsequent encounter: Secondary | ICD-10-CM | POA: Diagnosis not present

## 2015-08-13 DIAGNOSIS — I10 Essential (primary) hypertension: Secondary | ICD-10-CM | POA: Diagnosis not present

## 2015-08-13 DIAGNOSIS — I739 Peripheral vascular disease, unspecified: Secondary | ICD-10-CM | POA: Diagnosis not present

## 2015-08-13 DIAGNOSIS — M810 Age-related osteoporosis without current pathological fracture: Secondary | ICD-10-CM | POA: Diagnosis not present

## 2015-08-13 DIAGNOSIS — S32059D Unspecified fracture of fifth lumbar vertebra, subsequent encounter for fracture with routine healing: Secondary | ICD-10-CM | POA: Diagnosis not present

## 2015-08-13 DIAGNOSIS — F329 Major depressive disorder, single episode, unspecified: Secondary | ICD-10-CM | POA: Diagnosis not present

## 2015-08-13 DIAGNOSIS — J449 Chronic obstructive pulmonary disease, unspecified: Secondary | ICD-10-CM | POA: Diagnosis not present

## 2015-08-17 ENCOUNTER — Other Ambulatory Visit: Payer: Self-pay | Admitting: Family Medicine

## 2015-08-17 NOTE — Telephone Encounter (Signed)
Ok to refill 

## 2015-08-18 DIAGNOSIS — S060X0D Concussion without loss of consciousness, subsequent encounter: Secondary | ICD-10-CM | POA: Diagnosis not present

## 2015-08-18 DIAGNOSIS — J449 Chronic obstructive pulmonary disease, unspecified: Secondary | ICD-10-CM | POA: Diagnosis not present

## 2015-08-18 DIAGNOSIS — I10 Essential (primary) hypertension: Secondary | ICD-10-CM | POA: Diagnosis not present

## 2015-08-18 DIAGNOSIS — N390 Urinary tract infection, site not specified: Secondary | ICD-10-CM | POA: Diagnosis not present

## 2015-08-18 DIAGNOSIS — I739 Peripheral vascular disease, unspecified: Secondary | ICD-10-CM | POA: Diagnosis not present

## 2015-08-18 DIAGNOSIS — M810 Age-related osteoporosis without current pathological fracture: Secondary | ICD-10-CM | POA: Diagnosis not present

## 2015-08-18 DIAGNOSIS — F329 Major depressive disorder, single episode, unspecified: Secondary | ICD-10-CM | POA: Diagnosis not present

## 2015-08-18 DIAGNOSIS — S0083XD Contusion of other part of head, subsequent encounter: Secondary | ICD-10-CM | POA: Diagnosis not present

## 2015-08-18 DIAGNOSIS — S32059D Unspecified fracture of fifth lumbar vertebra, subsequent encounter for fracture with routine healing: Secondary | ICD-10-CM | POA: Diagnosis not present

## 2015-08-19 DIAGNOSIS — S060X0D Concussion without loss of consciousness, subsequent encounter: Secondary | ICD-10-CM | POA: Diagnosis not present

## 2015-08-19 DIAGNOSIS — J449 Chronic obstructive pulmonary disease, unspecified: Secondary | ICD-10-CM | POA: Diagnosis not present

## 2015-08-19 DIAGNOSIS — S0083XD Contusion of other part of head, subsequent encounter: Secondary | ICD-10-CM | POA: Diagnosis not present

## 2015-08-19 DIAGNOSIS — F329 Major depressive disorder, single episode, unspecified: Secondary | ICD-10-CM | POA: Diagnosis not present

## 2015-08-19 DIAGNOSIS — S32059D Unspecified fracture of fifth lumbar vertebra, subsequent encounter for fracture with routine healing: Secondary | ICD-10-CM | POA: Diagnosis not present

## 2015-08-19 DIAGNOSIS — I10 Essential (primary) hypertension: Secondary | ICD-10-CM | POA: Diagnosis not present

## 2015-08-19 DIAGNOSIS — M810 Age-related osteoporosis without current pathological fracture: Secondary | ICD-10-CM | POA: Diagnosis not present

## 2015-08-19 DIAGNOSIS — N390 Urinary tract infection, site not specified: Secondary | ICD-10-CM | POA: Diagnosis not present

## 2015-08-19 DIAGNOSIS — I739 Peripheral vascular disease, unspecified: Secondary | ICD-10-CM | POA: Diagnosis not present

## 2015-08-24 ENCOUNTER — Other Ambulatory Visit (INDEPENDENT_AMBULATORY_CARE_PROVIDER_SITE_OTHER): Payer: Commercial Managed Care - HMO

## 2015-08-24 DIAGNOSIS — R778 Other specified abnormalities of plasma proteins: Secondary | ICD-10-CM

## 2015-08-24 DIAGNOSIS — R110 Nausea: Secondary | ICD-10-CM

## 2015-08-24 DIAGNOSIS — D62 Acute posthemorrhagic anemia: Secondary | ICD-10-CM | POA: Diagnosis not present

## 2015-08-24 DIAGNOSIS — R7989 Other specified abnormal findings of blood chemistry: Secondary | ICD-10-CM

## 2015-08-24 DIAGNOSIS — N179 Acute kidney failure, unspecified: Secondary | ICD-10-CM | POA: Diagnosis not present

## 2015-08-24 LAB — COMPREHENSIVE METABOLIC PANEL
ALT: 11 U/L (ref 0–35)
AST: 15 U/L (ref 0–37)
Albumin: 3.9 g/dL (ref 3.5–5.2)
Alkaline Phosphatase: 105 U/L (ref 39–117)
BUN: 23 mg/dL (ref 6–23)
CO2: 29 mEq/L (ref 19–32)
Calcium: 9.2 mg/dL (ref 8.4–10.5)
Chloride: 107 mEq/L (ref 96–112)
Creatinine, Ser: 1.09 mg/dL (ref 0.40–1.20)
GFR: 52.91 mL/min — ABNORMAL LOW (ref >60.00)
Glucose, Bld: 106 mg/dL — ABNORMAL HIGH (ref 70–99)
Potassium: 4.9 mEq/L (ref 3.5–5.1)
Sodium: 140 mEq/L (ref 135–145)
Total Bilirubin: 0.3 mg/dL (ref 0.2–1.2)
Total Protein: 6.9 g/dL (ref 6.0–8.3)

## 2015-08-24 LAB — CBC WITH DIFFERENTIAL/PLATELET
Basophils Absolute: 0.1 10*3/uL (ref 0.0–0.1)
Basophils Relative: 0.8 % (ref 0.0–3.0)
Eosinophils Absolute: 0.2 10*3/uL (ref 0.0–0.7)
Eosinophils Relative: 3.1 % (ref 0.0–5.0)
HCT: 37.8 % (ref 36.0–46.0)
Hemoglobin: 12.5 g/dL (ref 12.0–15.0)
Lymphocytes Relative: 29.9 % (ref 12.0–46.0)
Lymphs Abs: 2.2 10*3/uL (ref 0.7–4.0)
MCHC: 33 g/dL (ref 30.0–36.0)
MCV: 90 fl (ref 78.0–100.0)
Monocytes Absolute: 0.6 10*3/uL (ref 0.1–1.0)
Monocytes Relative: 8.2 % (ref 3.0–12.0)
Neutro Abs: 4.3 10*3/uL (ref 1.4–7.7)
Neutrophils Relative %: 58 % (ref 43.0–77.0)
Platelets: 185 10*3/uL (ref 150.0–400.0)
RBC: 4.21 Mil/uL (ref 3.87–5.11)
RDW: 13.9 % (ref 11.5–15.5)
WBC: 7.4 10*3/uL (ref 4.0–10.5)

## 2015-08-24 LAB — TROPONIN I: TNIDX: 0.01 ug/l (ref 0.00–0.06)

## 2015-08-25 DIAGNOSIS — I10 Essential (primary) hypertension: Secondary | ICD-10-CM | POA: Diagnosis not present

## 2015-08-25 DIAGNOSIS — M810 Age-related osteoporosis without current pathological fracture: Secondary | ICD-10-CM | POA: Diagnosis not present

## 2015-08-25 DIAGNOSIS — N390 Urinary tract infection, site not specified: Secondary | ICD-10-CM | POA: Diagnosis not present

## 2015-08-25 DIAGNOSIS — S32059D Unspecified fracture of fifth lumbar vertebra, subsequent encounter for fracture with routine healing: Secondary | ICD-10-CM | POA: Diagnosis not present

## 2015-08-25 DIAGNOSIS — I739 Peripheral vascular disease, unspecified: Secondary | ICD-10-CM | POA: Diagnosis not present

## 2015-08-25 DIAGNOSIS — F329 Major depressive disorder, single episode, unspecified: Secondary | ICD-10-CM | POA: Diagnosis not present

## 2015-08-25 DIAGNOSIS — S0083XD Contusion of other part of head, subsequent encounter: Secondary | ICD-10-CM | POA: Diagnosis not present

## 2015-08-25 DIAGNOSIS — S060X0D Concussion without loss of consciousness, subsequent encounter: Secondary | ICD-10-CM | POA: Diagnosis not present

## 2015-08-25 DIAGNOSIS — J449 Chronic obstructive pulmonary disease, unspecified: Secondary | ICD-10-CM | POA: Diagnosis not present

## 2015-08-26 DIAGNOSIS — I739 Peripheral vascular disease, unspecified: Secondary | ICD-10-CM | POA: Diagnosis not present

## 2015-08-26 DIAGNOSIS — M810 Age-related osteoporosis without current pathological fracture: Secondary | ICD-10-CM | POA: Diagnosis not present

## 2015-08-26 DIAGNOSIS — S060X0D Concussion without loss of consciousness, subsequent encounter: Secondary | ICD-10-CM | POA: Diagnosis not present

## 2015-08-26 DIAGNOSIS — J449 Chronic obstructive pulmonary disease, unspecified: Secondary | ICD-10-CM | POA: Diagnosis not present

## 2015-08-26 DIAGNOSIS — F329 Major depressive disorder, single episode, unspecified: Secondary | ICD-10-CM | POA: Diagnosis not present

## 2015-08-26 DIAGNOSIS — S0083XD Contusion of other part of head, subsequent encounter: Secondary | ICD-10-CM | POA: Diagnosis not present

## 2015-08-26 DIAGNOSIS — I10 Essential (primary) hypertension: Secondary | ICD-10-CM | POA: Diagnosis not present

## 2015-08-26 DIAGNOSIS — N390 Urinary tract infection, site not specified: Secondary | ICD-10-CM | POA: Diagnosis not present

## 2015-08-26 DIAGNOSIS — S32059D Unspecified fracture of fifth lumbar vertebra, subsequent encounter for fracture with routine healing: Secondary | ICD-10-CM | POA: Diagnosis not present

## 2015-08-31 DIAGNOSIS — S0083XD Contusion of other part of head, subsequent encounter: Secondary | ICD-10-CM | POA: Diagnosis not present

## 2015-08-31 DIAGNOSIS — J449 Chronic obstructive pulmonary disease, unspecified: Secondary | ICD-10-CM | POA: Diagnosis not present

## 2015-08-31 DIAGNOSIS — I739 Peripheral vascular disease, unspecified: Secondary | ICD-10-CM | POA: Diagnosis not present

## 2015-08-31 DIAGNOSIS — F329 Major depressive disorder, single episode, unspecified: Secondary | ICD-10-CM | POA: Diagnosis not present

## 2015-08-31 DIAGNOSIS — S060X0D Concussion without loss of consciousness, subsequent encounter: Secondary | ICD-10-CM | POA: Diagnosis not present

## 2015-08-31 DIAGNOSIS — I10 Essential (primary) hypertension: Secondary | ICD-10-CM | POA: Diagnosis not present

## 2015-08-31 DIAGNOSIS — N390 Urinary tract infection, site not specified: Secondary | ICD-10-CM | POA: Diagnosis not present

## 2015-08-31 DIAGNOSIS — M810 Age-related osteoporosis without current pathological fracture: Secondary | ICD-10-CM | POA: Diagnosis not present

## 2015-08-31 DIAGNOSIS — S32059D Unspecified fracture of fifth lumbar vertebra, subsequent encounter for fracture with routine healing: Secondary | ICD-10-CM | POA: Diagnosis not present

## 2015-09-02 DIAGNOSIS — S0083XD Contusion of other part of head, subsequent encounter: Secondary | ICD-10-CM | POA: Diagnosis not present

## 2015-09-02 DIAGNOSIS — S060X0D Concussion without loss of consciousness, subsequent encounter: Secondary | ICD-10-CM | POA: Diagnosis not present

## 2015-09-02 DIAGNOSIS — I739 Peripheral vascular disease, unspecified: Secondary | ICD-10-CM | POA: Diagnosis not present

## 2015-09-02 DIAGNOSIS — F329 Major depressive disorder, single episode, unspecified: Secondary | ICD-10-CM | POA: Diagnosis not present

## 2015-09-02 DIAGNOSIS — N390 Urinary tract infection, site not specified: Secondary | ICD-10-CM | POA: Diagnosis not present

## 2015-09-02 DIAGNOSIS — M810 Age-related osteoporosis without current pathological fracture: Secondary | ICD-10-CM | POA: Diagnosis not present

## 2015-09-02 DIAGNOSIS — S32059D Unspecified fracture of fifth lumbar vertebra, subsequent encounter for fracture with routine healing: Secondary | ICD-10-CM | POA: Diagnosis not present

## 2015-09-02 DIAGNOSIS — J449 Chronic obstructive pulmonary disease, unspecified: Secondary | ICD-10-CM | POA: Diagnosis not present

## 2015-09-02 DIAGNOSIS — I10 Essential (primary) hypertension: Secondary | ICD-10-CM | POA: Diagnosis not present

## 2015-09-11 ENCOUNTER — Encounter: Payer: Self-pay | Admitting: Family Medicine

## 2015-09-11 ENCOUNTER — Ambulatory Visit (INDEPENDENT_AMBULATORY_CARE_PROVIDER_SITE_OTHER): Payer: Commercial Managed Care - HMO | Admitting: Family Medicine

## 2015-09-11 VITALS — BP 122/82 | HR 72 | Temp 98.0°F | Wt 174.5 lb

## 2015-09-11 DIAGNOSIS — R110 Nausea: Secondary | ICD-10-CM

## 2015-09-11 DIAGNOSIS — M1990 Unspecified osteoarthritis, unspecified site: Secondary | ICD-10-CM | POA: Diagnosis not present

## 2015-09-11 DIAGNOSIS — I10 Essential (primary) hypertension: Secondary | ICD-10-CM

## 2015-09-11 DIAGNOSIS — W19XXXD Unspecified fall, subsequent encounter: Secondary | ICD-10-CM

## 2015-09-11 DIAGNOSIS — M810 Age-related osteoporosis without current pathological fracture: Secondary | ICD-10-CM | POA: Diagnosis not present

## 2015-09-11 DIAGNOSIS — S12401D Unspecified nondisplaced fracture of fifth cervical vertebra, subsequent encounter for fracture with routine healing: Secondary | ICD-10-CM

## 2015-09-11 NOTE — Assessment & Plan Note (Signed)
Improved. Restart fosamax.

## 2015-09-11 NOTE — Progress Notes (Signed)
BP 122/82 mmHg  Pulse 72  Temp(Src) 98 F (36.7 C) (Oral)  Wt 174 lb 8 oz (79.153 kg)   CC: f/u visit  Subjective:    Patient ID: [name] [name], female    DOB: 1945/12/10, 69 y.o.   MRN: [id number]  HPI: [name] [name] is a 69 y.o. female presenting on 09/11/2015 for Follow-up   Hospitalized last month with C5 vertebral fracture, concussion and facial bruising after fall down front porch. Baseline R sided hemiparesis from prior CVA. Completed HH therapy. Has quad cane at home but not really using.   naproxyn working better than tylenol - requests refill. Using intermittently for aches mainly R temple and R jaw (after fall).   Osteoporosis - off fosamax for last few months due to nausea. Compliant with cal/vit D. Will restart.  Relevant past medical, surgical, family and social history reviewed and updated as indicated. Interim medical history since our last visit reviewed. Allergies and medications reviewed and updated. Current Outpatient Prescriptions on File Prior to Visit  Medication Sig  . acetaminophen (TYLENOL) 500 MG tablet Take 500 mg by mouth 2 (two) times daily as needed.  [name] albuterol (PROVENTIL HFA;VENTOLIN HFA) 108 (90 BASE) MCG/ACT inhaler Inhale 2 puffs into the lungs every 6 (six) hours as needed for wheezing or shortness of breath.   [name] aspirin EC 81 MG tablet Take 81 mg by mouth daily.  [name] atorvastatin (LIPITOR) 40 MG tablet TAKE 1 TABLET EVERY DAY  . benazepril (LOTENSIN) 10 MG tablet Take 1 tablet (10 mg total) by mouth daily.  . Calcium Carb-Cholecalciferol (CALCIUM-VITAMIN D) 600-400 MG-UNIT TABS Take 1 tablet by mouth daily.  . clopidogrel (PLAVIX) 75 MG tablet TAKE 1 TABLET EVERY DAY  . fexofenadine (ALLEGRA) 180 MG tablet Take 180 mg by mouth daily.  . sertraline (ZOLOFT) 100 MG tablet Take 1 tablet (100 mg total) by mouth daily.  . traMADol (ULTRAM) 50 MG tablet Take 1-2 tablets (50-100 mg total) by mouth every 6 (six) hours as needed (Pain).  (Patient taking differently: Take 50 mg by mouth 3 (three) times daily as needed (Pain). )  . alendronate (FOSAMAX) 70 MG tablet TAKE 1 TABLET WEEKLY. TAKE WITH A FULL GLASS OF WATER ON AN EMPTY STOMACH. (Patient not taking: Reported on 09/11/2015)   No current facility-administered medications on file prior to visit.    Review of Systems Per HPI unless specifically indicated in ROS section     Objective:    BP 122/82 mmHg  Pulse 72  Temp(Src) 98 F (36.7 C) (Oral)  Wt 174 lb 8 oz (79.153 kg)  Wt Readings from Last 3 Encounters:  09/11/15 174 lb 8 oz (79.153 kg)  08/06/15 172 lb 12 oz (78.359 kg)  08/01/15 176 lb 4.8 oz (79.969 kg)    Physical Exam  Constitutional: She appears well-developed and well-nourished. No distress.  HENT:  Head: Normocephalic and atraumatic.  Mouth/Throat: Oropharynx is clear and moist. No oropharyngeal exudate.  Eyes: Conjunctivae and EOM are normal. Pupils are equal, round, and reactive to light. No scleral icterus.  Cardiovascular: Normal rate, regular rhythm, normal heart sounds and intact distal pulses.   No murmur heard. Pulmonary/Chest: Effort normal and breath sounds normal. No respiratory distress. She has no wheezes. She has no rales.  Musculoskeletal: She exhibits no edema.  Skin: Skin is warm and dry. No rash noted.  Psychiatric: She has a normal mood and affect.  Nursing note and vitals reviewed.  Results for orders placed or  performed in visit on 08/24/15  Comprehensive metabolic panel  Result Value Ref Range   Sodium 140 135 - 145 mEq/L   Potassium 4.9 3.5 - 5.1 mEq/L   Chloride 107 96 - 112 mEq/L   CO2 29 19 - 32 mEq/L   Glucose, Bld 106 (H) 70 - 99 mg/dL   BUN 23 6 - 23 mg/dL   Creatinine, Ser 1.611.09 0.40 - 1.20 mg/dL   Total Bilirubin 0.3 0.2 - 1.2 mg/dL   Alkaline Phosphatase 105 39 - 117 U/L   AST 15 0 - 37 U/L   ALT 11 0 - 35 U/L   Total Protein 6.9 6.0 - 8.3 g/dL   Albumin 3.9 3.5 - 5.2 g/dL   Calcium 9.2 8.4 - 09.610.5  mg/dL   GFR 04.5452.91 (L) >09.81>60.00 mL/min  CBC with Differential/Platelet  Result Value Ref Range   WBC 7.4 4.0 - 10.5 K/uL   RBC 4.21 3.87 - 5.11 Mil/uL   Hemoglobin 12.5 12.0 - 15.0 g/dL   HCT 19.137.8 47.836.0 - 29.546.0 %   MCV 90.0 78.0 - 100.0 fl   MCHC 33.0 30.0 - 36.0 g/dL   RDW 62.113.9 30.811.5 - 65.715.5 %   Platelets 185.0 150.0 - 400.0 K/uL   Neutrophils Relative % 58.0 43.0 - 77.0 %   Lymphocytes Relative 29.9 12.0 - 46.0 %   Monocytes Relative 8.2 3.0 - 12.0 %   Eosinophils Relative 3.1 0.0 - 5.0 %   Basophils Relative 0.8 0.0 - 3.0 %   Neutro Abs 4.3 1.4 - 7.7 K/uL   Lymphs Abs 2.2 0.7 - 4.0 K/uL   Monocytes Absolute 0.6 0.1 - 1.0 K/uL   Eosinophils Absolute 0.2 0.0 - 0.7 K/uL   Basophils Absolute 0.1 0.0 - 0.1 K/uL  Troponin I  Result Value Ref Range   TNIDX 0.01 0.00 - 0.06 ug/l      Assessment & Plan:   Problem List Items Addressed This Visit    Osteoporosis - Primary    Fosamax held due to nausea. Will restart. If recurrent intolerance, low threshold to change - consider IV bisphosphonate.      Osteoarthritis    rec against NSAID given plavix/aspirin use. rec tylenol with tramadol for breakthrough pain      RESOLVED: Nausea without vomiting    Improved. Restart fosamax.      Hypertension    Chronic, stable. Continue current regimen.      Fracture of cervical vertebra, C5 (HCC)    Has healed well.      Fall    Has completed HH PT.           Follow up plan: Return in about 6 months (around 03/11/2016), or as needed, for medicare wellness visit.

## 2015-09-11 NOTE — Assessment & Plan Note (Signed)
rec against NSAID given plavix/aspirin use. rec tylenol with tramadol for breakthrough pain

## 2015-09-11 NOTE — Assessment & Plan Note (Signed)
Has completed HH PT.

## 2015-09-11 NOTE — Assessment & Plan Note (Signed)
Has healed well. 

## 2015-09-11 NOTE — Patient Instructions (Addendum)
Lets stop naproxyn. Ok to continue tylenol and tramadol Let's restart fosamax - watch for GI upset or nausea and if that happens again let us know for a different medication for osteoporosis. Return as needed or in 6 months for medicare wellness visit.

## 2015-09-11 NOTE — Assessment & Plan Note (Signed)
Chronic, stable. Continue current regimen. 

## 2015-09-11 NOTE — Assessment & Plan Note (Signed)
Fosamax held due to nausea. Will restart. If recurrent intolerance, low threshold to change - consider IV bisphosphonate.

## 2015-09-11 NOTE — Progress Notes (Signed)
Pre visit review using our clinic review tool, if applicable. No additional management support is needed unless otherwise documented below in the visit note. 

## 2015-09-14 ENCOUNTER — Encounter: Payer: Self-pay | Admitting: Family Medicine

## 2015-09-24 ENCOUNTER — Other Ambulatory Visit (HOSPITAL_COMMUNITY): Payer: Self-pay | Admitting: Interventional Radiology

## 2015-09-24 DIAGNOSIS — I729 Aneurysm of unspecified site: Secondary | ICD-10-CM

## 2015-09-24 DIAGNOSIS — I639 Cerebral infarction, unspecified: Secondary | ICD-10-CM

## 2015-09-24 DIAGNOSIS — I771 Stricture of artery: Secondary | ICD-10-CM

## 2015-10-13 ENCOUNTER — Ambulatory Visit (HOSPITAL_COMMUNITY): Payer: Commercial Managed Care - HMO

## 2015-10-13 ENCOUNTER — Ambulatory Visit (HOSPITAL_COMMUNITY)
Admission: RE | Admit: 2015-10-13 | Discharge: 2015-10-13 | Disposition: A | Discharge status: Home / Self Care | Payer: Commercial Managed Care - HMO | Source: Ambulatory Visit | Attending: Interventional Radiology | Admitting: Interventional Radiology

## 2015-10-13 DIAGNOSIS — I729 Aneurysm of unspecified site: Secondary | ICD-10-CM

## 2015-10-13 DIAGNOSIS — I639 Cerebral infarction, unspecified: Secondary | ICD-10-CM

## 2015-10-13 DIAGNOSIS — I6609 Occlusion and stenosis of unspecified middle cerebral artery: Secondary | ICD-10-CM | POA: Insufficient documentation

## 2015-10-13 DIAGNOSIS — Z8673 Personal history of transient ischemic attack (TIA), and cerebral infarction without residual deficits: Secondary | ICD-10-CM | POA: Diagnosis not present

## 2015-10-13 DIAGNOSIS — I771 Stricture of artery: Secondary | ICD-10-CM

## 2015-10-13 DIAGNOSIS — I671 Cerebral aneurysm, nonruptured: Secondary | ICD-10-CM | POA: Diagnosis not present

## 2015-10-13 DIAGNOSIS — I669 Occlusion and stenosis of unspecified cerebral artery: Secondary | ICD-10-CM | POA: Diagnosis not present

## 2015-10-13 MED ORDER — GADOBENATE DIMEGLUMINE 529 MG/ML IV SOLN
15.0000 mL | Freq: Once | INTRAVENOUS | Status: AC
  Administered 2015-10-13: 15 mL via INTRAVENOUS

## 2015-10-14 LAB — POCT I-STAT CREATININE: Creatinine, Ser: 1 mg/dL (ref 0.44–1.00)

## 2015-10-20 ENCOUNTER — Other Ambulatory Visit (HOSPITAL_COMMUNITY): Payer: Self-pay | Admitting: Interventional Radiology

## 2015-10-20 DIAGNOSIS — I771 Stricture of artery: Secondary | ICD-10-CM

## 2015-10-20 DIAGNOSIS — I729 Aneurysm of unspecified site: Secondary | ICD-10-CM

## 2015-10-29 ENCOUNTER — Ambulatory Visit (HOSPITAL_COMMUNITY)
Admission: RE | Admit: 2015-10-29 | Discharge: 2015-10-29 | Disposition: A | Discharge status: Home / Self Care | Payer: Commercial Managed Care - HMO | Source: Ambulatory Visit | Attending: Interventional Radiology | Admitting: Interventional Radiology

## 2015-10-29 DIAGNOSIS — I771 Stricture of artery: Secondary | ICD-10-CM

## 2015-10-29 DIAGNOSIS — I6522 Occlusion and stenosis of left carotid artery: Secondary | ICD-10-CM | POA: Diagnosis not present

## 2015-10-29 DIAGNOSIS — I729 Aneurysm of unspecified site: Secondary | ICD-10-CM

## 2015-11-03 ENCOUNTER — Other Ambulatory Visit (HOSPITAL_COMMUNITY): Payer: Self-pay | Admitting: Interventional Radiology

## 2015-11-03 DIAGNOSIS — I6389 Other cerebral infarction: Secondary | ICD-10-CM

## 2015-11-03 DIAGNOSIS — I771 Stricture of artery: Secondary | ICD-10-CM

## 2015-11-04 ENCOUNTER — Other Ambulatory Visit: Payer: Self-pay | Admitting: Radiology

## 2015-11-04 LAB — PLATELET INHIBITION P2Y12: Platelet Function  P2Y12: 186 [PRU] — ABNORMAL LOW (ref 194–418)

## 2015-11-05 ENCOUNTER — Other Ambulatory Visit: Payer: Self-pay | Admitting: Radiology

## 2015-11-09 ENCOUNTER — Encounter (HOSPITAL_COMMUNITY): Payer: Self-pay | Admitting: *Deleted

## 2015-11-09 ENCOUNTER — Inpatient Hospital Stay (HOSPITAL_COMMUNITY)
Admission: RE | Admit: 2015-11-09 | Discharge: 2015-11-09 | Disposition: A | Discharge status: Home / Self Care | Payer: Commercial Managed Care - HMO | Source: Ambulatory Visit

## 2015-11-09 NOTE — Pre-Procedure Instructions (Addendum)
[name] [name]  11/09/2015      [facility] - [place], [place] - [address] [place] [address] Phone: [phone] Fax: [phone]  [facility] - [address] [place]), [place] - [address] [place] ([place]) [place] [address] Phone: [phone] Fax: [phone]    Your procedure is scheduled on 11/11/15.  Report to [facility] Admitting at 700 A.M.  Call this number if you have problems the morning of surgery:  [phone]   Remember:  Do not eat food or drink liquids after midnight.  Take these medicines the morning of surgery with A SIP OF WATER inhaler if needed, zoloft,plavix, pain med if needed  STOP all herbel meds, nsaids (aleve,naproxen,advil,ibuprofen) starting TODAY including vitamins   Do not wear jewelry, make-up or nail polish.  Do not wear lotions, powders, or perfumes.  You may wear deodorant.  Do not shave 48 hours prior to surgery.  Men may shave face and neck.  Do not bring valuables to the hospital.  [facility] is not responsible for any belongings or valuables.  Contacts, dentures or bridgework may not be worn into surgery.  Leave your suitcase in the car.  After surgery it may be brought to your room.  For patients admitted to the hospital, discharge time will be determined by your treatment team.  Patients discharged the day of surgery will not be allowed to drive home.   Name and phone number of your driver:    Special instructions:  Special Instructions: [place] - Preparing for Surgery  Before surgery, you can play an important role.  Because skin is not sterile, your skin needs to be as free of germs as possible.  You can reduce the number of germs on you skin by washing with CHG (chlorahexidine gluconate) soap before surgery.  CHG is an antiseptic cleaner which kills germs and bonds with the skin to continue killing germs even after washing.  Please DO NOT  use if you have an allergy to CHG or antibacterial soaps.  If your skin becomes reddened/irritated stop using the CHG and inform your nurse when you arrive at Short Stay.  Do not shave (including legs and underarms) for at least 48 hours prior to the first CHG shower.  You may shave your face.  Please follow these instructions carefully:   1.  Shower with CHG Soap the night before surgery and the morning of Surgery.  2.  If you choose to wash your hair, wash your hair first as usual with your normal shampoo.  3.  After you shampoo, rinse your hair and body thoroughly to remove the Shampoo.  4.  Use CHG as you would any other liquid soap.  You can apply chg directly  to the skin and wash gently with scrungie or a clean washcloth.  5.  Apply the CHG Soap to your body ONLY FROM THE NECK DOWN.  Do not use on open wounds or open sores.  Avoid contact with your eyes ears, mouth and genitals (private parts).  Wash genitals (private parts)       with your normal soap.  6.  Wash thoroughly, paying special attention to the area where your surgery will be performed.  7.  Thoroughly rinse your body with warm water from the neck down.  8.  DO NOT shower/wash with your normal soap after using and rinsing off the CHG Soap.  9.  Pat yourself dry with a clean towel.            10.  Wear clean pajamas.            11.  Place clean sheets on your bed the night of your first shower and do not sleep with pets.  Day of Surgery  Do not apply any lotions/deodorants the morning of surgery.  Please wear clean clothes to the hospital/surgery center.  Please read over the following fact sheets that you were given. Pain Booklet, Coughing and Deep Breathing and Surgical Site Infection Prevention

## 2015-11-09 NOTE — Pre-Procedure Instructions (Signed)
JENISA MONTY  11/09/2015      [facility] - [place], [place] - [address] [place] [address] Phone: [phone] Fax: [phone]  [facility] - [address] [place]), [place] - [address] [place] ([place]) [place] [address] Phone: [phone] Fax: [phone]    Your procedure is scheduled on 11/11/15.  Report to [facility] Admitting at [phone].M. Labs to be done 11/10/15 at 1000am  Call this number if you have problems the morning of surgery:  [phone]   Remember:  Do not eat food or drink liquids after midnight.  Take these medicines the morning of surgery with A SIP OF WATER inhaler if needed, zoloft,plavix, pain med if needed  STOP all herbel meds, nsaids (aleve,naproxen,advil,ibuprofen) starting TODAY including vitamins   Do not wear jewelry, make-up or nail polish.  Do not wear lotions, powders, or perfumes.  You may wear deodorant.  Do not shave 48 hours prior to surgery.  Men may shave face and neck.  Do not bring valuables to the hospital.  [facility] is not responsible for any belongings or valuables.  Contacts, dentures or bridgework may not be worn into surgery.  Leave your suitcase in the car.  After surgery it may be brought to your room.  For patients admitted to the hospital, discharge time will be determined by your treatment team.  Patients discharged the day of surgery will not be allowed to drive home.   Name and phone number of your driver:    Special instructions:  Special Instructions: [place] - Preparing for Surgery  Before surgery, you can play an important role.  Because skin is not sterile, your skin needs to be as free of germs as possible.  You can reduce the number of germs on you skin by washing with CHG (chlorahexidine gluconate) soap before surgery.  CHG is an antiseptic cleaner which kills germs and bonds with the skin to continue killing germs even  after washing.  Please DO NOT use if you have an allergy to CHG or antibacterial soaps.  If your skin becomes reddened/irritated stop using the CHG and inform your nurse when you arrive at Short Stay.  Do not shave (including legs and underarms) for at least 48 hours prior to the first CHG shower.  You may shave your face.  Please follow these instructions carefully:   1.  Shower with CHG Soap the night before surgery and the morning of Surgery.  2.  If you choose to wash your hair, wash your hair first as usual with your normal shampoo.  3.  After you shampoo, rinse your hair and body thoroughly to remove the Shampoo.  4.  Use CHG as you would any other liquid soap.  You can apply chg directly  to the skin and wash gently with scrungie or a clean washcloth.  5.  Apply the CHG Soap to your body ONLY FROM THE NECK DOWN.  Do not use on open wounds or open sores.  Avoid contact with your eyes ears, mouth and genitals (private parts).  Wash genitals (private parts)       with your normal soap.  6.  Wash thoroughly, paying special attention to the area where your surgery will be performed.  7.  Thoroughly rinse your body with warm water from the neck down.  8.  DO NOT shower/wash with your normal soap after using and rinsing off  the CHG Soap.  9.  Pat yourself dry with a clean towel.            10.  Wear clean pajamas.            11.  Place clean sheets on your bed the night of your first shower and do not sleep with pets.  Day of Surgery  Do not apply any lotions/deodorants the morning of surgery.  Please wear clean clothes to the hospital/surgery center.  Please read over the following fact sheets that you were given. Pain Booklet, Coughing and Deep Breathing and Surgical Site Infection Prevention

## 2015-11-10 ENCOUNTER — Encounter (HOSPITAL_COMMUNITY)
Admission: RE | Admit: 2015-11-10 | Discharge: 2015-11-10 | Disposition: A | Discharge status: Home / Self Care | Payer: Commercial Managed Care - HMO | Source: Ambulatory Visit | Attending: Interventional Radiology | Admitting: Interventional Radiology

## 2015-11-10 DIAGNOSIS — J449 Chronic obstructive pulmonary disease, unspecified: Secondary | ICD-10-CM | POA: Diagnosis not present

## 2015-11-10 DIAGNOSIS — E669 Obesity, unspecified: Secondary | ICD-10-CM | POA: Diagnosis not present

## 2015-11-10 DIAGNOSIS — I10 Essential (primary) hypertension: Secondary | ICD-10-CM | POA: Diagnosis not present

## 2015-11-10 DIAGNOSIS — I6522 Occlusion and stenosis of left carotid artery: Secondary | ICD-10-CM | POA: Diagnosis not present

## 2015-11-10 DIAGNOSIS — Z87891 Personal history of nicotine dependence: Secondary | ICD-10-CM | POA: Diagnosis not present

## 2015-11-10 LAB — PLATELET INHIBITION P2Y12: Platelet Function  P2Y12: 198 [PRU] (ref 194–418)

## 2015-11-10 LAB — COMPREHENSIVE METABOLIC PANEL
ALT: 16 U/L (ref 14–54)
AST: 20 U/L (ref 15–41)
Albumin: 4.2 g/dL (ref 3.5–5.0)
Alkaline Phosphatase: 75 U/L (ref 38–126)
Anion gap: 12 (ref 5–15)
BUN: 21 mg/dL — ABNORMAL HIGH (ref 6–20)
CO2: 23 mmol/L (ref 22–32)
Calcium: 10.3 mg/dL (ref 8.9–10.3)
Chloride: 103 mmol/L (ref 101–111)
Creatinine, Ser: 1.03 mg/dL — ABNORMAL HIGH (ref 0.44–1.00)
GFR calc Af Amer: >60 mL/min (ref >60)
GFR calc non Af Amer: 54 mL/min — ABNORMAL LOW (ref >60)
Glucose, Bld: 102 mg/dL — ABNORMAL HIGH (ref 65–99)
Potassium: 4.3 mmol/L (ref 3.5–5.1)
Sodium: 138 mmol/L (ref 135–145)
Total Bilirubin: 0.6 mg/dL (ref 0.3–1.2)
Total Protein: 7.6 g/dL (ref 6.5–8.1)

## 2015-11-10 LAB — CBC WITH DIFFERENTIAL/PLATELET
Basophils Absolute: 0.1 10*3/uL (ref 0.0–0.1)
Basophils Relative: 1 %
Eosinophils Absolute: 0.1 10*3/uL (ref 0.0–0.7)
Eosinophils Relative: 2 %
HCT: 42.7 % (ref 36.0–46.0)
Hemoglobin: 13.7 g/dL (ref 12.0–15.0)
Lymphocytes Relative: 16 %
Lymphs Abs: 1.2 10*3/uL (ref 0.7–4.0)
MCH: 29.1 pg (ref 26.0–34.0)
MCHC: 32.1 g/dL (ref 30.0–36.0)
MCV: 90.7 fL (ref 78.0–100.0)
Monocytes Absolute: 0.6 10*3/uL (ref 0.1–1.0)
Monocytes Relative: 8 %
Neutro Abs: 5.4 10*3/uL (ref 1.7–7.7)
Neutrophils Relative %: 73 %
Platelets: 160 10*3/uL (ref 150–400)
RBC: 4.71 MIL/uL (ref 3.87–5.11)
RDW: 12.9 % (ref 11.5–15.5)
WBC: 7.3 10*3/uL (ref 4.0–10.5)

## 2015-11-10 LAB — APTT: aPTT: 30 seconds (ref 24–37)

## 2015-11-10 LAB — PROTIME-INR
INR: 1.06 (ref 0.00–1.49)
Prothrombin Time: 14 seconds (ref 11.6–15.2)

## 2015-11-11 ENCOUNTER — Ambulatory Visit (HOSPITAL_COMMUNITY): Payer: Commercial Managed Care - HMO | Admitting: Anesthesiology

## 2015-11-11 ENCOUNTER — Encounter (HOSPITAL_COMMUNITY): Payer: Self-pay | Admitting: *Deleted

## 2015-11-11 ENCOUNTER — Encounter (HOSPITAL_COMMUNITY)
Admission: RE | Disposition: A | Discharge status: Home / Self Care | Payer: Self-pay | Source: Ambulatory Visit | Attending: Interventional Radiology

## 2015-11-11 ENCOUNTER — Encounter (HOSPITAL_COMMUNITY): Payer: Self-pay

## 2015-11-11 ENCOUNTER — Ambulatory Visit (HOSPITAL_COMMUNITY)
Admission: RE | Admit: 2015-11-11 | Discharge: 2015-11-11 | Disposition: A | Discharge status: Home / Self Care | Payer: Commercial Managed Care - HMO | Source: Ambulatory Visit | Attending: Interventional Radiology | Admitting: Interventional Radiology

## 2015-11-11 DIAGNOSIS — I6523 Occlusion and stenosis of bilateral carotid arteries: Secondary | ICD-10-CM | POA: Diagnosis not present

## 2015-11-11 DIAGNOSIS — Z7982 Long term (current) use of aspirin: Secondary | ICD-10-CM | POA: Insufficient documentation

## 2015-11-11 DIAGNOSIS — I110 Hypertensive heart disease with heart failure: Secondary | ICD-10-CM | POA: Insufficient documentation

## 2015-11-11 DIAGNOSIS — E785 Hyperlipidemia, unspecified: Secondary | ICD-10-CM | POA: Diagnosis not present

## 2015-11-11 DIAGNOSIS — Z87891 Personal history of nicotine dependence: Secondary | ICD-10-CM | POA: Insufficient documentation

## 2015-11-11 DIAGNOSIS — Z8249 Family history of ischemic heart disease and other diseases of the circulatory system: Secondary | ICD-10-CM | POA: Insufficient documentation

## 2015-11-11 DIAGNOSIS — J449 Chronic obstructive pulmonary disease, unspecified: Secondary | ICD-10-CM | POA: Insufficient documentation

## 2015-11-11 DIAGNOSIS — I671 Cerebral aneurysm, nonruptured: Secondary | ICD-10-CM | POA: Diagnosis not present

## 2015-11-11 DIAGNOSIS — I10 Essential (primary) hypertension: Secondary | ICD-10-CM | POA: Diagnosis not present

## 2015-11-11 DIAGNOSIS — I6522 Occlusion and stenosis of left carotid artery: Secondary | ICD-10-CM | POA: Diagnosis not present

## 2015-11-11 DIAGNOSIS — Z7902 Long term (current) use of antithrombotics/antiplatelets: Secondary | ICD-10-CM | POA: Diagnosis not present

## 2015-11-11 DIAGNOSIS — I739 Peripheral vascular disease, unspecified: Secondary | ICD-10-CM | POA: Diagnosis not present

## 2015-11-11 DIAGNOSIS — F329 Major depressive disorder, single episode, unspecified: Secondary | ICD-10-CM | POA: Insufficient documentation

## 2015-11-11 DIAGNOSIS — I69351 Hemiplegia and hemiparesis following cerebral infarction affecting right dominant side: Secondary | ICD-10-CM | POA: Diagnosis not present

## 2015-11-11 DIAGNOSIS — E669 Obesity, unspecified: Secondary | ICD-10-CM | POA: Diagnosis not present

## 2015-11-11 DIAGNOSIS — M810 Age-related osteoporosis without current pathological fracture: Secondary | ICD-10-CM | POA: Diagnosis not present

## 2015-11-11 DIAGNOSIS — I771 Stricture of artery: Secondary | ICD-10-CM | POA: Diagnosis not present

## 2015-11-11 DIAGNOSIS — I6389 Other cerebral infarction: Secondary | ICD-10-CM

## 2015-11-11 HISTORY — PX: RADIOLOGY WITH ANESTHESIA: SHX6223

## 2015-11-11 SURGERY — RADIOLOGY WITH ANESTHESIA
Anesthesia: Monitor Anesthesia Care

## 2015-11-11 MED ORDER — LACTATED RINGERS IV SOLN
INTRAVENOUS | Status: DC
  Administered 2015-11-11: 08:00:00 via INTRAVENOUS

## 2015-11-11 MED ORDER — ASPIRIN EC 325 MG PO TBEC
325.0000 mg | DELAYED_RELEASE_TABLET | ORAL | Status: AC
  Administered 2015-11-11: 325 mg via ORAL
  Filled 2015-11-11: qty 1

## 2015-11-11 MED ORDER — SODIUM CHLORIDE 0.9 % IV SOLN: Freq: Once | INTRAVENOUS | Status: DC

## 2015-11-11 MED ORDER — LIDOCAINE HCL 1 % IJ SOLN
INTRAMUSCULAR | Status: AC
  Filled 2015-11-11: qty 20

## 2015-11-11 MED ORDER — FENTANYL CITRATE (PF) 100 MCG/2ML IJ SOLN
INTRAMUSCULAR | Status: DC | PRN
  Administered 2015-11-11: 50 ug via INTRAVENOUS

## 2015-11-11 MED ORDER — MIDAZOLAM HCL 5 MG/5ML IJ SOLN
INTRAMUSCULAR | Status: DC | PRN
  Administered 2015-11-11: 0.5 mg via INTRAVENOUS

## 2015-11-11 MED ORDER — SODIUM CHLORIDE 0.9 % IV SOLN: INTRAVENOUS | Status: DC

## 2015-11-11 MED ORDER — CEFAZOLIN SODIUM-DEXTROSE 2-3 GM-% IV SOLR
2.0000 g | INTRAVENOUS | Status: DC
  Filled 2015-11-11: qty 50

## 2015-11-11 MED ORDER — CLOPIDOGREL BISULFATE 75 MG PO TABS: 75.0000 mg | ORAL_TABLET | ORAL | Status: DC

## 2015-11-11 MED ORDER — IOHEXOL 300 MG/ML  SOLN
150.0000 mL | Freq: Once | INTRAMUSCULAR | Status: AC | PRN
  Administered 2015-11-11: 60 mL via INTRA_ARTERIAL

## 2015-11-11 MED ORDER — HEPARIN SODIUM (PORCINE) 1000 UNIT/ML IJ SOLN
INTRAMUSCULAR | Status: DC | PRN
  Administered 2015-11-11: 1000 [IU] via INTRAVENOUS

## 2015-11-11 MED ORDER — PHENYLEPHRINE HCL 10 MG/ML IJ SOLN
INTRAMUSCULAR | Status: DC | PRN
  Administered 2015-11-11: 40 ug via INTRAVENOUS

## 2015-11-11 MED ORDER — NIMODIPINE 30 MG PO CAPS: 0.0000 mg | ORAL_CAPSULE | ORAL | Status: DC

## 2015-11-11 NOTE — Progress Notes (Signed)
Plavix and nimotop not given in preop per Beckey Downing, PA. Will be given after arteriogram if they proceed. Okay to give  PO aspirin.

## 2015-11-11 NOTE — H&P (Signed)
Chief Complaint: Patient was seen in consultation today for cerebral arteriogram with possible Left internal carotid stenosis angioplasty/stent placement at the request of Dr [name]  Referring Physician(s): Dr Sharen Hones  History of Present Illness: [name] [name] is a 70 y.o. female   Pt known to [facility] Hx L Internal carotid artery pta/stent 09/2008 Has been followed with imaging with Dr Corliss Skains No new symptoms of complaint Family relates pt has had frontal headache off and on x weeks (Tylenol helps)  MR Brain 10/13/15:  IMPRESSION: 1. No acute intracranial abnormality. 2. New, chronic microhemorrhage in the right frontal lobe. 3. Moderate chronic small vessel ischemic disease. Chronic bilateral parietal infarcts. 4. High-grade stenosis of the left cavernous ICA, slightly progressed from the prior MRA. 5. Unchanged, moderate right M1 MCA stenosis. 6. Unchanged 5 mm proximal right cavernous ICA aneurysm.  Now scheduled for cerebral arteriogram with possible angioplasty/stent of L ICA stenosis   Past Medical History  Diagnosis Date  . Hypertension   . Stroke [facility]) 2010    x3 with residual R hemiparesis, s/p R MCA balloon angioplasty (2010)  . Peripheral vascular disease (HCC)   . COPD (chronic obstructive pulmonary disease) (HCC) 12/2012    spirometry: Pre: FVC 84%, FEV1 69%, ratio 0.64 consistent with moderate obstruction.  . Hyperlipidemia   . Osteoarthritis     h/o ruptured disc s/p ESI  . Depression   . Smoker     quit 10/2012  . History of chicken pox   . Carotid stenosis     R 50% (12/2012)  . Lower back pain     h/o HNP s/p surgery  . Osteoporosis 11/2010    DEXA -2.7 spine, thoracic compression fracture  . Aneurysm (HCC)   . Fracture of cervical vertebra, C5 (HCC) 08/06/2015  . Concussion 08/03/2015  . Fall 08/03/2015    d/c home health 08/2015    Past Surgical History  Procedure Laterality Date  . Cholecystectomy  1970's  . Appendectomy   1960's  . Cesarean section    . Cataract extraction      bilateral  . L hip surgery  2006    fractured - screws placed  . Dexa  11/2010    T -2.7 spine, -1.9 hip    Allergies: Doxycycline  Medications: Prior to Admission medications   Medication Sig Start Date End Date Taking? Authorizing Provider  acetaminophen (TYLENOL) 500 MG tablet Take 500 mg by mouth 2 (two) times daily as needed for mild pain.     Historical Provider, MD  albuterol (PROVENTIL HFA;VENTOLIN HFA) 108 (90 BASE) MCG/ACT inhaler Inhale 2 puffs into the lungs every 6 (six) hours as needed for wheezing or shortness of breath.     Historical Provider, MD  alendronate (FOSAMAX) 70 MG tablet TAKE 1 TABLET WEEKLY. TAKE WITH A FULL GLASS OF WATER ON AN EMPTY STOMACH. 12/12/14   [name], MD  aspirin EC 81 MG tablet Take 81 mg by mouth daily.    Historical Provider, MD  atorvastatin (LIPITOR) 40 MG tablet TAKE 1 TABLET EVERY DAY 05/06/15   [name], MD  benazepril (LOTENSIN) 10 MG tablet Take 1 tablet (10 mg total) by mouth daily. 01/02/15   [name], MD  Calcium Carb-Cholecalciferol (CALCIUM-VITAMIN D) 600-400 MG-UNIT TABS Take 1 tablet by mouth daily.    Historical Provider, MD  clopidogrel (PLAVIX) 75 MG tablet TAKE 1 TABLET EVERY DAY 05/06/15   [name], MD  fexofenadine (ALLEGRA) 180 MG tablet Take 180  mg by mouth daily.    Historical Provider, MD  sertraline (ZOLOFT) 100 MG tablet Take 1 tablet (100 mg total) by mouth daily. 01/29/15   [name], MD  traMADol (ULTRAM) 50 MG tablet Take 1-2 tablets (50-100 mg total) by mouth every 6 (six) hours as needed (Pain). Patient taking differently: Take 50 mg by mouth 3 (three) times daily as needed (Pain).  08/04/15   Freeman Caldron, PA-C  traZODone (DESYREL) 100 MG tablet Take 150 mg by mouth at bedtime as needed for sleep. for sleep 09/11/15   [name], MD     Family History  Problem Relation Age of Onset  . CAD Mother     MI  . CAD  Maternal Aunt     MI  . CAD Sister     MI  . Sudden death Father 79    died in his sleep, chain smoker  . Cirrhosis Father   . Cancer Mother     cervical  . Hypertension Daughter   . Diabetes Maternal Aunt   . Cancer Maternal Grandmother     cervical    Social History   Social History  . Marital Status: Divorced    Spouse Name: N/A  . Number of Children: N/A  . Years of Education: N/A   Social History Main Topics  . Smoking status: Former Smoker -- 1.00 packs/day for 45 years    Types: Cigarettes    Quit date: 11/08/2012  . Smokeless tobacco: Never Used  . Alcohol Use: No  . Drug Use: No  . Sexual Activity: Not Currently   Other Topics Concern  . None   Social History Narrative   Lives with granddaughter and great grandchildren   Occupation: retired, was Lawyer   Edu: 8th grade   Activity: no regular activity:   Diet: some water, fruits/vegetables daily     Review of Systems: A 12 point ROS discussed and pertinent positives are indicated in the HPI above.  All other systems are negative.  Review of Systems  Constitutional: Positive for activity change and fatigue. Negative for fever, appetite change and unexpected weight change.  HENT: Negative for tinnitus and trouble swallowing.   Eyes: Negative for visual disturbance.  Respiratory: Positive for shortness of breath. Negative for cough.   Gastrointestinal: Negative for nausea, vomiting and abdominal pain.  Musculoskeletal: Negative for back pain.       Cannot walk too far secondary "tired feeling in legs" Occasional short of breath  Neurological: Positive for weakness and headaches. Negative for dizziness, tremors, seizures, syncope, facial asymmetry, speech difficulty, light-headedness and numbness.  Psychiatric/Behavioral: Negative for behavioral problems, confusion and decreased concentration.    Vital Signs: There were no vitals taken for this visit.  Physical Exam  Constitutional: She is oriented to  person, place, and time.  HENT:  Head: Atraumatic.  Eyes: EOM are normal.  Neck: Neck supple.  Cardiovascular: Normal rate, regular rhythm and normal heart sounds.   No murmur heard. Pulmonary/Chest: Effort normal and breath sounds normal. She has no wheezes.  Abdominal: Soft. Bowel sounds are normal. There is no tenderness.  Musculoskeletal: Normal range of motion. She exhibits no tenderness.  Neurological: She is alert and oriented to person, place, and time.  Skin: Skin is warm and dry.  Psychiatric: She has a normal mood and affect. Her behavior is normal. Judgment and thought content normal.  Nursing note and vitals reviewed.   Mallampati Score:  MD Evaluation Airway: WNL Heart: WNL Abdomen:  WNL Chest/ Lungs: WNL ASA  Classification: 3 Mallampati/Airway Score: One  Imaging: Mr Shirlee Latch Wo Contrast  10/13/2015  CLINICAL DATA:  Intracranial arterial stenosis. Prior strokes. Aneurysm. Follow-up. No new symptoms. EXAM: MRI HEAD WITHOUT AND WITH CONTRAST MRA HEAD WITHOUT CONTRAST TECHNIQUE: Multiplanar, multiecho pulse sequences of the brain and surrounding structures were obtained without and with intravenous contrast. Angiographic images of the head were obtained using MRA technique without contrast. CONTRAST:  15mL MULTIHANCE GADOBENATE DIMEGLUMINE 529 MG/ML IV SOLN COMPARISON:  Brain MRI 08/02/2015. Head CTA 12/30/2014. Head MRA 03/18/2014. Cerebral angiogram 02/06/2015. FINDINGS: MRI HEAD FINDINGS There is no evidence of acute infarct, mass, midline shift, or extra-axial fluid collection. There is a new chronic microhemorrhage in the posterior right frontal lobe near the vertex age advanced cerebral atrophy is unchanged. Chronic bilateral parietal infarcts are again noted as well as moderate chronic small vessel ischemic disease in the cerebral white matter. No abnormal enhancement is identified. Prior bilateral cataract extraction is noted. There is prominent mucosal thickening in  the left maxillary sinus with a small amount of secretions noted. Small bilateral mastoid effusions are present. Major intracranial vascular flow voids are preserved. MRA HEAD FINDINGS The visualized distal vertebral arteries are patent with the left being dominant. The distal right vertebral artery is hypoplastic and mildly irregular. Left PICA origin is patent. Bilateral AICA and SCA origins are patent. Basilar artery is patent without stenosis. PCAs are patent with moderate distal branch vessel irregularity bilaterally. Irregularity and mild narrowing of the mid left P2 segment is similar to the prior CTA. Internal carotid arteries are patent from skullbase to carotid termini. 5 mm aneurysm of the proximal right cavernous ICA is unchanged. Mild narrowing is again noted of the distal right petrous segment just proximal to this aneurysm. High-grade stenosis of the left cavernous ICA appear slightly greater compared to the prior MRA, estimated at greater than 75% stenosis. Moderate stenosis of the right M1 segment does not appear significantly changed from the prior MRA. Anterior communicating artery is patent. A1 segments are patent without stenosis, with the right being mildly dominant. Moderate anterior circulation branch vessel irregularity is noted. IMPRESSION: 1. No acute intracranial abnormality. 2. New, chronic microhemorrhage in the right frontal lobe. 3. Moderate chronic small vessel ischemic disease. Chronic bilateral parietal infarcts. 4. High-grade stenosis of the left cavernous ICA, slightly progressed from the prior MRA. 5. Unchanged, moderate right M1 MCA stenosis. 6. Unchanged 5 mm proximal right cavernous ICA aneurysm. Electronically Signed   By: Sebastian Ache M.D.   On: 10/13/2015 17:37   Mr Laqueta Jean ZO Contrast  10/13/2015  CLINICAL DATA:  Intracranial arterial stenosis. Prior strokes. Aneurysm. Follow-up. No new symptoms. EXAM: MRI HEAD WITHOUT AND WITH CONTRAST MRA HEAD WITHOUT CONTRAST  TECHNIQUE: Multiplanar, multiecho pulse sequences of the brain and surrounding structures were obtained without and with intravenous contrast. Angiographic images of the head were obtained using MRA technique without contrast. CONTRAST:  15mL MULTIHANCE GADOBENATE DIMEGLUMINE 529 MG/ML IV SOLN COMPARISON:  Brain MRI 08/02/2015. Head CTA 12/30/2014. Head MRA 03/18/2014. Cerebral angiogram 02/06/2015. FINDINGS: MRI HEAD FINDINGS There is no evidence of acute infarct, mass, midline shift, or extra-axial fluid collection. There is a new chronic microhemorrhage in the posterior right frontal lobe near the vertex age advanced cerebral atrophy is unchanged. Chronic bilateral parietal infarcts are again noted as well as moderate chronic small vessel ischemic disease in the cerebral white matter. No abnormal enhancement is identified. Prior bilateral cataract extraction is noted. There  is prominent mucosal thickening in the left maxillary sinus with a small amount of secretions noted. Small bilateral mastoid effusions are present. Major intracranial vascular flow voids are preserved. MRA HEAD FINDINGS The visualized distal vertebral arteries are patent with the left being dominant. The distal right vertebral artery is hypoplastic and mildly irregular. Left PICA origin is patent. Bilateral AICA and SCA origins are patent. Basilar artery is patent without stenosis. PCAs are patent with moderate distal branch vessel irregularity bilaterally. Irregularity and mild narrowing of the mid left P2 segment is similar to the prior CTA. Internal carotid arteries are patent from skullbase to carotid termini. 5 mm aneurysm of the proximal right cavernous ICA is unchanged. Mild narrowing is again noted of the distal right petrous segment just proximal to this aneurysm. High-grade stenosis of the left cavernous ICA appear slightly greater compared to the prior MRA, estimated at greater than 75% stenosis. Moderate stenosis of the right M1  segment does not appear significantly changed from the prior MRA. Anterior communicating artery is patent. A1 segments are patent without stenosis, with the right being mildly dominant. Moderate anterior circulation branch vessel irregularity is noted. IMPRESSION: 1. No acute intracranial abnormality. 2. New, chronic microhemorrhage in the right frontal lobe. 3. Moderate chronic small vessel ischemic disease. Chronic bilateral parietal infarcts. 4. High-grade stenosis of the left cavernous ICA, slightly progressed from the prior MRA. 5. Unchanged, moderate right M1 MCA stenosis. 6. Unchanged 5 mm proximal right cavernous ICA aneurysm. Electronically Signed   By: Sebastian Ache M.D.   On: 10/13/2015 17:37   Ir Radiologist Eval & Mgmt  11/02/2015  EXAM: ESTABLISHED PATIENT OFFICE VISIT CHIEF COMPLAINT: Occasional dizziness.  Forgetfulness.  Slow gait. Current Pain Level: 1-10 HISTORY OF PRESENT ILLNESS: The patient is a 70 year old, right-handed lady who presents in follow-up accompanied by her daughter. Patient had a recent MRI MRA of the brain. They are both here to discuss the findings of the MRI/MRA. From a clinical standpoint the patient reports no significant change in her neurological symptoms. According to her and her daughter, the patient continues to have occasional dizziness with forgetfulness and tends to be slow in terms of walking. These symptoms have remained stable over the past year. She denies any visual blindness, diplopia or loss of vision, like a curtain in the left eye. No history of either limp, weakness, paresthesias, incoordination, seizures, loss of consciousness. Patient reports no recent symptoms of autonomic dysfunction of her bowel or bladder. She does have a mild intention tremor in both her hands, right greater than left. These apparently are also unchanged though become more prominent with excitement or nervousness. Past medical history, past surgical history as per previously. The  patient at the present time takes Tylenol, albuterol inhaler, alendronate, aspirin, atorvastatin, benazepril, calcium cholecalciferol tablets, clopidogrel, Allegra, Zoloft, tramadol and trazodone. Patient is allergic to doxycycline. Social History: Single has 3 children alive and well. She does not smoke at this time previously used to. Consumes one cup of coffee every day. Denies any alcohol or other recreational drugs. Family history of asthma, aneurysm, cancer, seizures, headaches, heart problems, high blood pressure, psychiatric disease and migraine. Review of systems is essentially negative for pathologic symptomatology more specifically, patient denies any chest pain, shortness of breath, coughing with productive cough. No recent chills or fever or rigors. PHYSICAL EXAMINATION: In no acute distress.  Affect normal. Neurologically grossly intact. ASSESSMENT AND PLAN: Patient's recent MRI scan and MRA of the brain from 10/13/2015 was reviewed with the  previous MRI scan also catheter angiograms in the past. The most recent MRA examination appears to suggest worsening of the left internal carotid artery cavernous region severe stenosis. However the distal vasculature appears unchanged. Also no new parenchymal signal abnormalities are identified on the current MRI scan of the brain compared to MRI scan of 08/12/2015. Options discussed with the patient were those of continued medical management with her antiplatelets and antihypertensive with a repeat MRI/MRA of the brain in six months time versus consideration of a catheter arteriogram with an intent to treat endovascularly the tight severe stenoses in the cavernous segment of the left internal carotid artery. The procedure, the risks, the benefits were all reviewed with the patient and her daughter. Patient is to call should she have new signs or symptoms especially due to severe left internal carotid artery cavernous region stenoses, right-sided weakness  right-sided facial droop, numbness, difficulty swallowing, visual blurring, or right-sided weakness or right-sided incoordination or sensory abnormalities. Also the patient and her daughter were notified to be aware of speech difficulties such as word-finding difficulties or comprehension difficulties. At this time they would like to think over the 2 options. In the meantime she has been asked to continue taking her medications. Also she has been asked to continue to maintain mobility with a walker and without a walker as much as possible. They will let us know in the next few days regarding the option. Electronically Signed   By: Julieanne Cotton M.D.   On: 10/30/2015 11:15    Labs:  CBC:  Recent Labs  08/02/15 0140 08/03/15 0540 08/24/15 1159 11/10/15 1021  WBC 7.1 6.2 7.4 7.3  HGB 10.4* 10.3* 12.5 13.7  HCT 33.8* 33.7* 37.8 42.7  PLT 123* PLATELET CLUMPS NOTED ON SMEAR, COUNT APPEARS DECREASED 185.0 160    COAGS:  Recent Labs  02/06/15 0510 08/01/15 1526 11/10/15 1021  INR 1.02 1.04 1.06  APTT 30  --  30    BMP:  Recent Labs  07/31/15 1013 08/01/15 1526 08/02/15 0140 08/24/15 1159 10/13/15 1408 11/10/15 1021  NA 136 134* 134* 140  --  138  K 4.2 5.0 4.3 4.9  --  4.3  CL 105 102 103 107  --  103  CO2 21* 28 26 29   --  23  GLUCOSE 165* 136* 125* 106*  --  102*  BUN 20 19 12 23   --  21*  CALCIUM 9.2 8.8* 7.7* 9.2  --  10.3  CREATININE 1.08* 1.25* 0.82 1.09 1.00 1.03*  GFRNONAA 52* 43* >60  --   --  54*  GFRAA 60* 50* >60  --   --  >60    LIVER FUNCTION TESTS:  Recent Labs  08/01/15 1526 08/02/15 0140 08/24/15 1159 11/10/15 1021  BILITOT 0.4 0.5 0.3 0.6  AST 171* 92* 15 20  ALT 180* 115* 11 16  ALKPHOS 80 59 105 75  PROT 6.5 5.3* 6.9 7.6  ALBUMIN 3.5 2.8* 3.9 4.2    TUMOR MARKERS: No results for input(s): AFPTM, CEA, CA199, CHROMGRNA in the last 8760 hours.  Assessment and Plan:  Hx L ICA pta/stent 09/2008 Followed with imaging and Dr  Corliss Skains Most recent MR Brain does reveal new stenosis L ICA Now scheduled for cerebral arteriogram with possible L ICA angioplasty/stent placement Risks and Benefits discussed with the patient including, but not limited to bleeding, infection, vascular injury, contrast induced renal failure, stroke or even death. All of the patient's questions were answered, patient is agreeable  to proceed. Consent signed and in chart.  Pt and family aware if intervention is performed she will be admitted overnight to Neuro ICU. Plan for discharge in am They are agreeable to proceed  Thank you for this interesting consult.  I greatly enjoyed meeting Darlis S Faught and look forward to participating in their care.  A copy of this report was sent to the requesting provider on this date.  Electronically Signed: Maily Debarge A 11/11/2015, 7:37 AM   I spent a total of  30 Minutes   in face to face in clinical consultation, greater than 50% of which was counseling/coordinating care for L ICA pta/stent

## 2015-11-11 NOTE — Discharge Instructions (Signed)
Angiogram, Care After °Refer to this sheet in the next few weeks. These instructions provide you with information about caring for yourself after your procedure. Your health care provider may also give you more specific instructions. Your treatment has been planned according to current medical practices, but problems sometimes occur. Call your health care provider if you have any problems or questions after your procedure. °WHAT TO EXPECT AFTER THE PROCEDURE °After your procedure, it is typical to have the following: °· Bruising at the catheter insertion site that usually fades within 1-2 weeks. °· Blood collecting in the tissue (hematoma) that may be painful to the touch. It should usually decrease in size and tenderness within 1-2 weeks. °HOME CARE INSTRUCTIONS °· Take medicines only as directed by your health care provider. °· You may shower 24-48 hours after the procedure or as directed by your health care provider. Remove the bandage (dressing) and gently wash the site with plain soap and water. Pat the area dry with a clean towel. Do not rub the site, because this may cause bleeding. °· Do not take baths, swim, or use a hot tub until your health care provider approves. °· Check your insertion site every day for redness, swelling, or drainage. °· Do not apply powder or lotion to the site. °· Do not lift over 10 lb (4.5 kg) for 5 days after your procedure or as directed by your health care provider. °· Ask your health care provider when it is okay to: °¨ Return to work or school. °¨ Resume usual physical activities or sports. °¨ Resume sexual activity. °· Do not drive home if you are discharged the same day as the procedure. Have someone else drive you. °· You may drive 24 hours after the procedure unless otherwise instructed by your health care provider. °· Do not operate machinery or power tools for 24 hours after the procedure or as directed by your health care provider. °· If your procedure was done as an  outpatient procedure, which means that you went home the same day as your procedure, a responsible adult should be with you for the first 24 hours after you arrive home. °· Keep all follow-up visits as directed by your health care provider. This is important. °SEEK MEDICAL CARE IF: °· You have a fever. °· You have chills. °· You have increased bleeding from the catheter insertion site. Hold pressure on the site.  CALL 911 °SEEK IMMEDIATE MEDICAL CARE IF: °· You have unusual pain at the catheter insertion site. °· You have redness, warmth, or swelling at the catheter insertion site. °· You have drainage (other than a small amount of blood on the dressing) from the catheter insertion site. °· The catheter insertion site is bleeding, and the bleeding does not stop after 30 minutes of holding steady pressure on the site. °· The area near or just beyond the catheter insertion site becomes pale, cool, tingly, or numb. °  °This information is not intended to replace advice given to you by your health care provider. Make sure you discuss any questions you have with your health care provider. °  °Document Released: 03/31/2005 Document Revised: 10/03/2014 Document Reviewed: 02/13/2013 °Elsevier Interactive Patient Education ©2016 [employer] ° °

## 2015-11-11 NOTE — Anesthesia Postprocedure Evaluation (Signed)
Anesthesia Post Note  Patient: [name] [name]  Procedure(s) Performed: Procedure(s) (LRB): RADIOLOGY WITH ANESTHESIA (N/A)  Patient location during evaluation: PACU Anesthesia Type: MAC Level of consciousness: awake and alert Pain management: pain level controlled Vital Signs Assessment: post-procedure vital signs reviewed and stable Respiratory status: spontaneous breathing, nonlabored ventilation, respiratory function stable and patient connected to nasal cannula oxygen Cardiovascular status: stable and blood pressure returned to baseline Anesthetic complications: no    Last Vitals:  Filed Vitals:   11/11/15 1139 11/11/15 1209  BP: 111/64 116/46  Pulse:  71  Temp:    Resp:      Last Pain: There were no vitals filed for this visit.               Cecile Hearing

## 2015-11-11 NOTE — Procedures (Signed)
S/P bilateral common carotid arteriograms and RT Vert artery angiogram. RT CFA apptoach . 1.Approx 60 to 70 % stenosis of t lt CCA mid seg and Approx 60 % stenosis of LT ICA cavernous seg.[name] 2.Approx 70 % stenosis of RT MCA M1 seg. 3.Approx  50 to 60 % stenosis of RT ICA prox. 4.Sable  Rt ICA petrous cav aneurysm

## 2015-11-11 NOTE — Transfer of Care (Signed)
Immediate Anesthesia Transfer of Care Note  Patient: [name] [name]  Procedure(s) Performed: Procedure(s): RADIOLOGY WITH ANESTHESIA (N/A)  Patient Location: PACU and Short Stay  Anesthesia Type:MAC  Level of Consciousness: awake  Airway & Oxygen Therapy: Patient Spontanous Breathing and Patient connected to nasal cannula oxygen  Post-op Assessment: Report given to RN and Post -op Vital signs reviewed and stable  Post vital signs: Reviewed and stable  Last Vitals:  Filed Vitals:   11/11/15 0719  BP: 106/58  Pulse: 73  Temp: 36.7 C  Resp: 20    Complications: No apparent anesthesia complications

## 2015-11-11 NOTE — Anesthesia Preprocedure Evaluation (Addendum)
Anesthesia Evaluation  Patient identified by MRN, date of birth, ID band Patient awake    Reviewed: Allergy & Precautions, NPO status , Patient's Chart, lab work & pertinent test results  History of Anesthesia Complications Negative for: history of anesthetic complications  Airway Mallampati: II  TM Distance: >3 FB Neck ROM: Full    Dental  (+) Dental Advisory Given, Edentulous Upper, Upper Dentures   Pulmonary COPD,  COPD inhaler, former smoker,    Pulmonary exam normal breath sounds clear to auscultation       Cardiovascular hypertension, Pt. on medications + Peripheral Vascular Disease  Normal cardiovascular exam Rhythm:Regular Rate:Normal     Neuro/Psych  Headaches, PSYCHIATRIC DISORDERS Depression 10/13/15 MRI Head: IMPRESSION: 1. No acute intracranial abnormality. 2. New, chronic microhemorrhage in the right frontal lobe. 3. Moderate chronic small vessel ischemic disease. Chronic bilateral parietal infarcts. 4. High-grade stenosis of the left cavernous ICA, slightly progressed from the prior MRA. 5. Unchanged, moderate right M1 MCA stenosis. 6. Unchanged 5 mm proximal right cavernous ICA aneurysm. CVA, Residual Symptoms    GI/Hepatic negative GI ROS, Neg liver ROS,   Endo/Other  Obesity   Renal/GU negative Renal ROS     Musculoskeletal  (+) Arthritis , Osteoarthritis,    Abdominal   Peds  Hematology negative hematology ROS (+)   Anesthesia Other Findings Day of surgery medications reviewed with the patient.  Reproductive/Obstetrics                          Anesthesia Physical Anesthesia Plan  ASA: IV  Anesthesia Plan: MAC   Post-op Pain Management:    Induction: Intravenous  Airway Management Planned: Nasal Cannula  Additional Equipment:   Intra-op Plan:   Post-operative Plan:   Informed Consent: I have reviewed the patients History and Physical, chart, labs and  discussed the procedure including the risks, benefits and alternatives for the proposed anesthesia with the patient or authorized representative who has indicated his/her understanding and acceptance.   Dental advisory given  Plan Discussed with: CRNA  Anesthesia Plan Comments: (Start out MAC sedation, may convert to GA depending on angiography findings.  Both types of anesthesia discussed with patient.)       Anesthesia Quick Evaluation

## 2015-11-11 NOTE — Progress Notes (Signed)
5 Fr. Exoseal to right groin 

## 2015-11-12 ENCOUNTER — Encounter (HOSPITAL_COMMUNITY): Payer: Self-pay | Admitting: Interventional Radiology

## 2016-01-04 ENCOUNTER — Other Ambulatory Visit: Payer: Self-pay | Admitting: Family Medicine

## 2016-01-04 MED ORDER — CLOPIDOGREL BISULFATE 75 MG PO TABS: 75.0000 mg | ORAL_TABLET | Freq: Every day | ORAL | Status: DC

## 2016-01-04 NOTE — Telephone Encounter (Signed)
[name] [name] pts granddaughter request refill # 10 of plavix to [facility] until mail order med comes. Advised done.

## 2016-01-23 ENCOUNTER — Other Ambulatory Visit: Payer: Self-pay | Admitting: Family Medicine

## 2016-01-23 DIAGNOSIS — I10 Essential (primary) hypertension: Secondary | ICD-10-CM

## 2016-01-23 DIAGNOSIS — E785 Hyperlipidemia, unspecified: Secondary | ICD-10-CM

## 2016-01-23 DIAGNOSIS — M810 Age-related osteoporosis without current pathological fracture: Secondary | ICD-10-CM

## 2016-01-23 DIAGNOSIS — I69351 Hemiplegia and hemiparesis following cerebral infarction affecting right dominant side: Secondary | ICD-10-CM

## 2016-01-23 DIAGNOSIS — Z1159 Encounter for screening for other viral diseases: Secondary | ICD-10-CM

## 2016-01-25 ENCOUNTER — Other Ambulatory Visit (INDEPENDENT_AMBULATORY_CARE_PROVIDER_SITE_OTHER): Payer: Commercial Managed Care - HMO

## 2016-01-25 ENCOUNTER — Other Ambulatory Visit: Payer: Self-pay | Admitting: Family Medicine

## 2016-01-25 DIAGNOSIS — M810 Age-related osteoporosis without current pathological fracture: Secondary | ICD-10-CM | POA: Diagnosis not present

## 2016-01-25 DIAGNOSIS — I10 Essential (primary) hypertension: Secondary | ICD-10-CM | POA: Diagnosis not present

## 2016-01-25 DIAGNOSIS — E785 Hyperlipidemia, unspecified: Secondary | ICD-10-CM

## 2016-01-25 DIAGNOSIS — Z1159 Encounter for screening for other viral diseases: Secondary | ICD-10-CM | POA: Diagnosis not present

## 2016-01-25 LAB — BASIC METABOLIC PANEL
BUN: 15 mg/dL (ref 6–23)
CO2: 28 mEq/L (ref 19–32)
Calcium: 9.5 mg/dL (ref 8.4–10.5)
Chloride: 105 mEq/L (ref 96–112)
Creatinine, Ser: 0.98 mg/dL (ref 0.40–1.20)
GFR: 59.75 mL/min — ABNORMAL LOW (ref >60.00)
Glucose, Bld: 99 mg/dL (ref 70–99)
Potassium: 4.7 mEq/L (ref 3.5–5.1)
Sodium: 140 mEq/L (ref 135–145)

## 2016-01-25 LAB — VITAMIN D 25 HYDROXY (VIT D DEFICIENCY, FRACTURES): VITD: 19.8 ng/mL — ABNORMAL LOW (ref 30.00–100.00)

## 2016-01-25 LAB — MICROALBUMIN / CREATININE URINE RATIO
Creatinine,U: 98.5 mg/dL
Microalb Creat Ratio: 1.8 mg/g (ref 0.0–30.0)
Microalb, Ur: 1.8 mg/dL (ref 0.0–1.9)

## 2016-01-25 LAB — LIPID PANEL
Cholesterol: 167 mg/dL (ref 0–200)
HDL: 58.4 mg/dL (ref >39.00)
LDL Cholesterol: 91 mg/dL (ref 0–99)
NonHDL: 108.18
Total CHOL/HDL Ratio: 3
Triglycerides: 86 mg/dL (ref 0.0–149.0)
VLDL: 17.2 mg/dL (ref 0.0–40.0)

## 2016-01-25 LAB — HEPATITIS C ANTIBODY: HCV Ab: NEGATIVE

## 2016-02-01 ENCOUNTER — Encounter: Payer: Self-pay | Admitting: Family Medicine

## 2016-02-01 ENCOUNTER — Ambulatory Visit (INDEPENDENT_AMBULATORY_CARE_PROVIDER_SITE_OTHER): Payer: Commercial Managed Care - HMO | Admitting: Family Medicine

## 2016-02-01 VITALS — BP 132/78 | HR 60 | Temp 97.9°F | Ht 60.5 in | Wt 169.5 lb

## 2016-02-01 DIAGNOSIS — I69351 Hemiplegia and hemiparesis following cerebral infarction affecting right dominant side: Secondary | ICD-10-CM

## 2016-02-01 DIAGNOSIS — F331 Major depressive disorder, recurrent, moderate: Secondary | ICD-10-CM

## 2016-02-01 DIAGNOSIS — R296 Repeated falls: Secondary | ICD-10-CM

## 2016-02-01 DIAGNOSIS — Z1211 Encounter for screening for malignant neoplasm of colon: Secondary | ICD-10-CM | POA: Diagnosis not present

## 2016-02-01 DIAGNOSIS — Z7189 Other specified counseling: Secondary | ICD-10-CM

## 2016-02-01 DIAGNOSIS — Z0000 Encounter for general adult medical examination without abnormal findings: Secondary | ICD-10-CM | POA: Diagnosis not present

## 2016-02-01 DIAGNOSIS — I739 Peripheral vascular disease, unspecified: Secondary | ICD-10-CM

## 2016-02-01 DIAGNOSIS — I10 Essential (primary) hypertension: Secondary | ICD-10-CM

## 2016-02-01 DIAGNOSIS — Z87891 Personal history of nicotine dependence: Secondary | ICD-10-CM

## 2016-02-01 DIAGNOSIS — S12401D Unspecified nondisplaced fracture of fifth cervical vertebra, subsequent encounter for fracture with routine healing: Secondary | ICD-10-CM

## 2016-02-01 DIAGNOSIS — G4700 Insomnia, unspecified: Secondary | ICD-10-CM

## 2016-02-01 DIAGNOSIS — M810 Age-related osteoporosis without current pathological fracture: Secondary | ICD-10-CM

## 2016-02-01 DIAGNOSIS — I720 Aneurysm of carotid artery: Secondary | ICD-10-CM

## 2016-02-01 DIAGNOSIS — E785 Hyperlipidemia, unspecified: Secondary | ICD-10-CM

## 2016-02-01 NOTE — Assessment & Plan Note (Signed)
Chronic, stable. Continue sertraline + trazodone.

## 2016-02-01 NOTE — Assessment & Plan Note (Signed)
Seems fully healed. 

## 2016-02-01 NOTE — Assessment & Plan Note (Signed)
Advanced directives discussed - working on this - needs to get notarized. Would want daughter Alexia Freestoneatty to be HCPOA.

## 2016-02-01 NOTE — Progress Notes (Signed)
BP 132/78 mmHg  Pulse 60  Temp(Src) 97.9 F (36.6 C) (Oral)  Ht 5' 0.5" (1.537 m)  Wt 169 lb 8 oz (76.885 kg)  BMI 32.55 kg/m2   CC: medicare wellness visit  Subjective:    Patient ID: [name] [name], female    DOB: 10-23-1945, 70 y.o.   MRN: [id number]  HPI: [name] [name] is a 70 y.o. female presenting on 02/01/2016 for Annual Exam   Presents with caregiver today. Has life alert system.   Bad cough present for last month. Clear mucous and drainage down throat. No fevers, sore throat. Not improving with allegra.   Feels mood doing well on current regimen of sertraline 169m + trazodone 1580mnightly.  h/o mult CVAs, compliant with plavix and aspirin. Sees Dr. DeEstanislado Pandyegularly - told has residual aneurysm. Sees every 6 months. R residual hemiparesis from remote CVA. Tends to drag R foot. Known carotid stenosis.  Quit smoking 2014 - remains abstinent.   Hearing screen passed Vision screen per eye doctor - 09/2015 1 fall in last year without injury. H/o recurrent falls. Declined PT, considering aquatic exercise. Depression screen - passed  Preventative: Colonoscopy 2004 - WNL (PSharlett Iles Requests stool kit. Pap smear normal 11/2012 - strong fmhx cervical cancer. Had desired continued screening - due today 2017. Declines rpt today. Denies pelvic pressure/pain/bleed.  Mammogram normal 11/2014  Lung cancer screen - will discuss next visit DEXA Date: 11/2010 T -2.7 spine, -1.9 hip Osteoporosis. Compliant with cal/vit D and fosamax weekly (started 2013). Declines rpt  Flu shot yearly Pneumovax - 11/2012. Prevnar 12/2013. Tdap 2016 zostavax - interested - has not received yet.  Advanced directives discussed - working on this - needs to get notarized. Would want daughter [name] [name] be HCPOA.  Seat belt use discussed Sunscreen use discussed. No suspicious moles.  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 Interim medical history since our last visit reviewed. Allergies and medications reviewed and updated. Current Outpatient Prescriptions on File Prior to Visit  Medication Sig  . acetaminophen (TYLENOL) 500 MG tablet Take 500 mg by mouth 2 (two) times daily as needed for mild pain.   . Marland Kitchenlbuterol (PROVENTIL HFA;VENTOLIN HFA) 108 (90 BASE) MCG/ACT inhaler Inhale 2 puffs into the lungs every 6 (six) hours as needed for wheezing or shortness of breath.   . Marland Kitchenlendronate (FOSAMAX) 70 MG tablet TAKE 1 TABLET WEEKLY. TAKE WITH A FULL GLASS OF WATER ON AN EMPTY STOMACH.  . Marland Kitchenspirin EC 81 MG tablet Take 81 mg by mouth daily.  . Marland Kitchentorvastatin (LIPITOR) 40 MG tablet TAKE 1 TABLET EVERY DAY  . benazepril (LOTENSIN) 10 MG tablet Take 1 tablet (10 mg total) by mouth daily.  . Calcium Carb-Cholecalciferol (CALCIUM-VITAMIN D) 600-400 MG-UNIT TABS Take 1 tablet by mouth daily.  . clopidogrel (PLAVIX) 75 MG tablet Take 1 tablet (75 mg total) by mouth daily.  . fexofenadine (ALLEGRA) 180 MG tablet Take 180 mg by mouth daily.  . sertraline (ZOLOFT) 100 MG tablet Take 1 tablet (100 mg total) by mouth daily.  . traMADol (ULTRAM) 50 MG tablet Take 1-2 tablets (50-100 mg total) by mouth every 6 (six) hours as needed (Pain). (Patient taking differently: Take 50 mg by mouth 3 (three) times daily as needed (Pain). )  . traZODone (DESYREL) 100 MG tablet Take 150 mg  by mouth at bedtime as needed for sleep. for sleep   No current facility-administered medications on file prior to visit.    Review of Systems  Constitutional: Negative for fever, chills, activity change, appetite change, fatigue and unexpected weight change.  HENT: Negative for hearing loss.   Eyes: Negative for visual disturbance.  Respiratory: Positive for cough. Negative for chest tightness, shortness of  breath and wheezing.   Cardiovascular: Positive for leg swelling (mild). Negative for chest pain and palpitations.  Gastrointestinal: Negative for nausea, vomiting, abdominal pain, diarrhea, constipation, blood in stool and abdominal distention.  Genitourinary: Negative for hematuria and difficulty urinating.  Musculoskeletal: Negative for myalgias, arthralgias and neck pain.  Skin: Negative for rash.  Neurological: Positive for headaches. Negative for dizziness, seizures and syncope.  Hematological: Negative for adenopathy. Bruises/bleeds easily.  Psychiatric/Behavioral: Negative for dysphoric mood. The patient is not nervous/anxious.    Per HPI unless specifically indicated in ROS section     Objective:    BP 132/78 mmHg  Pulse 60  Temp(Src) 97.9 F (36.6 C) (Oral)  Ht 5' 0.5" (1.537 m)  Wt 169 lb 8 oz (76.885 kg)  BMI 32.55 kg/m2  Wt Readings from Last 3 Encounters:  02/01/16 169 lb 8 oz (76.885 kg)  11/11/15 171 lb (77.565 kg)  11/10/15 171 lb 3.2 oz (77.656 kg)    Physical Exam  Constitutional: She is oriented to person, place, and time. She appears well-developed and well-nourished. No distress.  HENT:  Head: Normocephalic and atraumatic.  Right Ear: Hearing, tympanic membrane, external ear and ear canal normal.  Left Ear: Hearing, tympanic membrane, external ear and ear canal normal.  Nose: Nose normal.  Mouth/Throat: Uvula is midline, oropharynx is clear and moist and mucous membranes are normal. No oropharyngeal exudate, posterior oropharyngeal edema or posterior oropharyngeal erythema.  Eyes: Conjunctivae and EOM are normal. Pupils are equal, round, and reactive to light. No scleral icterus.  Neck: Normal range of motion. Neck supple. Carotid bruit is present (bilat). No thyromegaly present.  Cardiovascular: Normal rate, regular rhythm, normal heart sounds and intact distal pulses.   No murmur heard. Pulses:      Radial pulses are 2+ on the right side, and 2+ on the  left side.  Pulmonary/Chest: Effort normal and breath sounds normal. No respiratory distress. She has no wheezes. She has no rales.  Abdominal: Soft. Bowel sounds are normal. She exhibits no distension and no mass. There is no tenderness. There is no rebound and no guarding.  Musculoskeletal: Normal range of motion. She exhibits no edema.  Lymphadenopathy:    She has no cervical adenopathy.  Neurological: She is alert and oriented to person, place, and time.  CN grossly intact, station and gait intact Recall 3/3 Calculation - unable to do, does not attempt  Skin: Skin is warm and dry. No rash noted.  Psychiatric: She has a normal mood and affect. Her behavior is normal. Judgment and thought content normal.  Nursing note and vitals reviewed.  Results for orders placed or performed in visit on 01/25/16  Hepatitis C antibody  Result Value Ref Range   HCV Ab NEGATIVE NEGATIVE      Assessment & Plan:   Problem List Items Addressed This Visit    Hypertension    Chronic, stable. Continue current regimen.      Peripheral vascular disease ([place])    Known carotid disease followed by IR      Hyperlipidemia    Chronic, stable. Continue lipitor.  MDD (major depressive disorder), recurrent episode, moderate (HCC)    Chronic, stable. Continue sertraline + trazodone.      Ex-smoker    Consider lung cancer screening CT      Medicare annual wellness visit, subsequent - Primary    I have personally reviewed the Medicare Annual Wellness questionnaire and have noted 1. The patient's medical and social history 2. Their use of alcohol, tobacco or illicit drugs 3. Their current medications and supplements 4. The patient's functional ability including ADL's, fall risks, home safety risks and hearing or visual impairment. Cognitive function has been assessed and addressed as indicated.  5. Diet and physical activity 6. Evidence for depression or mood disorders The patients weight, height,  BMI have been recorded in the chart. I have made referrals, counseling and provided education to the patient based on review of the above and I have provided the pt with a written personalized care plan for preventive services. Provider list updated.. See scanned questionairre as needed for further documentation. Reviewed preventative protocols and updated unless pt declined.       Osteoporosis    Last DEXA 2012, declines repeat at this time. On fosamax for last 2-3 yrs.       Insomnia    Continue trazodone 142m nightly      Recurrent falls   Hemiparesis affecting right side as late effect of stroke (HCC)    Improving strength.       [place]otid artery aneurysm ([place]    Continue f/u with IR      Advanced care planning/counseling discussion    Advanced directives discussed - working on this - needs to get notarized. Would want daughter PChong Sicilianto be HCPOA.       Health maintenance examination    Preventative protocols reviewed and updated unless pt declined. Discussed healthy diet and lifestyle.       RESOLVED: Fracture of cervical vertebra, C5 (HCC)    Seems fully healed       Other Visit Diagnoses    Special screening for malignant neoplasms, colon        Relevant Orders    Fecal occult blood, imunochemical        Follow up plan: Return in about 6 months (around 08/03/2016), or as needed, for follow up visit.  JRia Bush MD

## 2016-02-01 NOTE — Progress Notes (Signed)
Pre visit review using our clinic review tool, if applicable. No additional management support is needed unless otherwise documented below in the visit note. 

## 2016-02-01 NOTE — Assessment & Plan Note (Signed)
Continue f/u with IR 

## 2016-02-01 NOTE — Patient Instructions (Addendum)
Call your insurance about the shingles shot to see if it is covered or how much it would cost and where is cheaper (here or pharmacy).  If you want to receive here, call for nurse visit.  Pass by lab to pick up stool kit.  Good to see you today, call us with questions Return as needed or in 6 months for follow up visit.  Menopause is a normal process in which your reproductive ability comes to an end. This process happens gradually over a span of months to years, usually between the ages of 68 and 20. Menopause is complete when you have missed 12 consecutive menstrual periods. It is important to talk with your health care provider about some of the most common conditions that affect postmenopausal women, such as heart disease, cancer, and bone loss (osteoporosis). Adopting a healthy lifestyle and getting preventive care can help to promote your health and wellness. Those actions can also lower your chances of developing some of these common conditions. WHAT SHOULD I KNOW ABOUT MENOPAUSE? During menopause, you may experience a number of symptoms, such as:  Moderate-to-severe hot flashes.  Night sweats.  Decrease in sex drive.  Mood swings.  Headaches.  Tiredness.  Irritability.  Memory problems.  Insomnia. Choosing to treat or not to treat menopausal changes is an individual decision that you make with your health care provider. WHAT SHOULD I KNOW ABOUT HORMONE REPLACEMENT THERAPY AND SUPPLEMENTS? Hormone therapy products are effective for treating symptoms that are associated with menopause, such as hot flashes and night sweats. Hormone replacement carries certain risks, especially as you become older. If you are thinking about using estrogen or estrogen with progestin treatments, discuss the benefits and risks with your health care provider. WHAT SHOULD I KNOW ABOUT HEART DISEASE AND STROKE? Heart disease, heart attack, and stroke become more likely as you age. This may be due, in  part, to the hormonal changes that your body experiences during menopause. These can affect how your body processes dietary fats, triglycerides, and cholesterol. Heart attack and stroke are both medical emergencies. There are many things that you can do to help prevent heart disease and stroke:  Have your blood pressure checked at least every 1-2 years. High blood pressure causes heart disease and increases the risk of stroke.  If you are 19-84 years old, ask your health care provider if you should take aspirin to prevent a heart attack or a stroke.  Do not use any tobacco products, including cigarettes, chewing tobacco, or electronic cigarettes. If you need help quitting, ask your health care provider.  It is important to eat a healthy diet and maintain a healthy weight.  Be sure to include plenty of vegetables, fruits, low-fat dairy products, and lean protein.  Avoid eating foods that are high in solid fats, added sugars, or salt (sodium).  Get regular exercise. This is one of the most important things that you can do for your health.  Try to exercise for at least 150 minutes each week. The type of exercise that you do should increase your heart rate and make you sweat. This is known as moderate-intensity exercise.  Try to do strengthening exercises at least twice each week. Do these in addition to the moderate-intensity exercise.  Know your numbers.Ask your health care provider to check your cholesterol and your blood glucose. Continue to have your blood tested as directed by your health care provider. WHAT SHOULD I KNOW ABOUT CANCER SCREENING? There are several types of cancer.  Take the following steps to reduce your risk and to catch any cancer development as early as possible. Breast Cancer  Practice breast self-awareness.  This means understanding how your breasts normally appear and feel.  It also means doing regular breast self-exams. Let your health care provider know about  any changes, no matter how small.  If you are 57 or older, have a clinician do a breast exam (clinical breast exam or CBE) every year. Depending on your age, family history, and medical history, it may be recommended that you also have a yearly breast X-ray (mammogram).  If you have a family history of breast cancer, talk with your health care provider about genetic screening.  If you are at high risk for breast cancer, talk with your health care provider about having an MRI and a mammogram every year.  Breast cancer (BRCA) gene test is recommended for women who have family members with BRCA-related cancers. Results of the assessment will determine the need for genetic counseling and BRCA1 and for BRCA2 testing. BRCA-related cancers include these types:  Breast. This occurs in males or females.  Ovarian.  Tubal. This may also be called fallopian tube cancer.  Cancer of the abdominal or pelvic lining (peritoneal cancer).  Prostate.  Pancreatic. Cervical, Uterine, and Ovarian Cancer Your health care provider may recommend that you be screened regularly for cancer of the pelvic organs. These include your ovaries, uterus, and vagina. This screening involves a pelvic exam, which includes checking for microscopic changes to the surface of your cervix (Pap test).  For women ages 21-65, health care providers may recommend a pelvic exam and a Pap test every three years. For women ages 72-65, they may recommend the Pap test and pelvic exam, combined with testing for human papilloma virus (HPV), every five years. Some types of HPV increase your risk of cervical cancer. Testing for HPV may also be done on women of any age who have unclear Pap test results.  Other health care providers may not recommend any screening for nonpregnant women who are considered low risk for pelvic cancer and have no symptoms. Ask your health care provider if a screening pelvic exam is right for you.  If you have had past  treatment for cervical cancer or a condition that could lead to cancer, you need Pap tests and screening for cancer for at least 20 years after your treatment. If Pap tests have been discontinued for you, your risk factors (such as having a new sexual partner) need to be reassessed to determine if you should start having screenings again. Some women have medical problems that increase the chance of getting cervical cancer. In these cases, your health care provider may recommend that you have screening and Pap tests more often.  If you have a family history of uterine cancer or ovarian cancer, talk with your health care provider about genetic screening.  If you have vaginal bleeding after reaching menopause, tell your health care provider.  There are currently no reliable tests available to screen for ovarian cancer. Lung Cancer Lung cancer screening is recommended for adults 92-28 years old who are at high risk for lung cancer because of a history of smoking. A yearly low-dose CT scan of the lungs is recommended if you:  Currently smoke.  Have a history of at least 30 pack-years of smoking and you currently smoke or have quit within the past 15 years. A pack-year is smoking an average of one pack of cigarettes per day  for one year. Yearly screening should:  Continue until it has been 15 years since you quit.  Stop if you develop a health problem that would prevent you from having lung cancer treatment. Colorectal Cancer  This type of cancer can be detected and can often be prevented.  Routine colorectal cancer screening usually begins at age 99 and continues through age 1.  If you have risk factors for colon cancer, your health care provider may recommend that you be screened at an earlier age.  If you have a family history of colorectal cancer, talk with your health care provider about genetic screening.  Your health care provider may also recommend using home test kits to check for  hidden blood in your stool.  A small camera at the end of a tube can be used to examine your colon directly (sigmoidoscopy or colonoscopy). This is done to check for the earliest forms of colorectal cancer.  Direct examination of the colon should be repeated every 5-10 years until age 86. However, if early forms of precancerous polyps or small growths are found or if you have a family history or genetic risk for colorectal cancer, you may need to be screened more often. Skin Cancer  Check your skin from head to toe regularly.  Monitor any moles. Be sure to tell your health care provider:  About any new moles or changes in moles, especially if there is a change in a mole's shape or color.  If you have a mole that is larger than the size of a pencil eraser.  If any of your family members has a history of skin cancer, especially at a young age, talk with your health care provider about genetic screening.  Always use sunscreen. Apply sunscreen liberally and repeatedly throughout the day.  Whenever you are outside, protect yourself by wearing long sleeves, pants, a wide-brimmed hat, and sunglasses. WHAT SHOULD I KNOW ABOUT OSTEOPOROSIS? Osteoporosis is a condition in which bone destruction happens more quickly than new bone creation. After menopause, you may be at an increased risk for osteoporosis. To help prevent osteoporosis or the bone fractures that can happen because of osteoporosis, the following is recommended:  If you are 55-37 years old, get at least 1,000 mg of calcium and at least 600 mg of vitamin D per day.  If you are older than age 59 but younger than age 70, get at least 1,200 mg of calcium and at least 600 mg of vitamin D per day.  If you are older than age 50, get at least 1,200 mg of calcium and at least 800 mg of vitamin D per day. Smoking and excessive alcohol intake increase the risk of osteoporosis. Eat foods that are rich in calcium and vitamin D, and do weight-bearing  exercises several times each week as directed by your health care provider. WHAT SHOULD I KNOW ABOUT HOW MENOPAUSE AFFECTS [place]? Depression may occur at any age, but it is more common as you become older. Common symptoms of depression include:  Low or sad mood.  Changes in sleep patterns.  Changes in appetite or eating patterns.  Feeling an overall lack of motivation or enjoyment of activities that you previously enjoyed.  Frequent crying spells. Talk with your health care provider if you think that you are experiencing depression. WHAT SHOULD I KNOW ABOUT IMMUNIZATIONS? It is important that you get and maintain your immunizations. These include:  Tetanus, diphtheria, and pertussis (Tdap) booster vaccine.  Influenza every year  before the flu season begins.  Pneumonia vaccine.  Shingles vaccine. Your health care provider may also recommend other immunizations.   This information is not intended to replace advice given to you by your health care provider. Make sure you discuss any questions you have with your health care provider.   Document Released: 11/04/2005 Document Revised: 10/03/2014 Document Reviewed: 05/15/2014 Elsevier Interactive Patient Education [employer].

## 2016-02-01 NOTE — Assessment & Plan Note (Signed)
Preventative protocols reviewed and updated unless pt declined. Discussed healthy diet and lifestyle.  

## 2016-02-01 NOTE — Assessment & Plan Note (Signed)

## 2016-02-01 NOTE — Assessment & Plan Note (Signed)
Continue trazodone 150 mg nightly

## 2016-02-01 NOTE — Assessment & Plan Note (Signed)
Chronic, stable. Continue lipitor.  

## 2016-02-01 NOTE — Assessment & Plan Note (Signed)
Consider lung cancer screening CT

## 2016-02-01 NOTE — Assessment & Plan Note (Signed)
Chronic, stable. Continue current regimen. 

## 2016-02-01 NOTE — Assessment & Plan Note (Signed)
Improving strength.

## 2016-02-01 NOTE — Assessment & Plan Note (Signed)
Last DEXA 2012, declines repeat at this time. On fosamax for last 2-3 yrs.

## 2016-02-01 NOTE — Assessment & Plan Note (Signed)
Known carotid disease followed by IR

## 2016-02-05 ENCOUNTER — Other Ambulatory Visit: Payer: Self-pay | Admitting: Family Medicine

## 2016-02-09 ENCOUNTER — Other Ambulatory Visit: Payer: Self-pay | Admitting: Family Medicine

## 2016-02-10 ENCOUNTER — Other Ambulatory Visit: Payer: Self-pay | Admitting: Family Medicine

## 2016-02-19 NOTE — Telephone Encounter (Signed)
Archie Pattenonya said pharmacy notified pt that her meds had been denied; Archie Pattenonya does not know names of meds; Tonya to ck for names of meds in question and will cb or have pharmacy contact our office.

## 2016-02-23 ENCOUNTER — Other Ambulatory Visit: Payer: Self-pay | Admitting: Family Medicine

## 2016-02-23 ENCOUNTER — Other Ambulatory Visit: Payer: Commercial Managed Care - HMO

## 2016-02-23 DIAGNOSIS — Z1211 Encounter for screening for malignant neoplasm of colon: Secondary | ICD-10-CM

## 2016-02-23 LAB — FECAL OCCULT BLOOD, IMMUNOCHEMICAL: Fecal Occult Bld: POSITIVE — AB

## 2016-02-24 ENCOUNTER — Other Ambulatory Visit: Payer: Self-pay | Admitting: Family Medicine

## 2016-03-06 DIAGNOSIS — Z1211 Encounter for screening for malignant neoplasm of colon: Secondary | ICD-10-CM | POA: Diagnosis not present

## 2016-03-06 DIAGNOSIS — Z1212 Encounter for screening for malignant neoplasm of rectum: Secondary | ICD-10-CM | POA: Diagnosis not present

## 2016-03-20 LAB — COLOGUARD: Cologuard: POSITIVE

## 2016-03-22 ENCOUNTER — Telehealth: Payer: Self-pay | Admitting: Family Medicine

## 2016-03-22 DIAGNOSIS — R195 Other fecal abnormalities: Secondary | ICD-10-CM

## 2016-03-22 NOTE — Telephone Encounter (Signed)
Danella DeisShay called to make sure Dr.Gutierrez received stool sample results for patient.  It was positive.  If results weren't received,please call Shay back.

## 2016-03-24 NOTE — Telephone Encounter (Signed)
Pt was notified she would like to proceed with GI. Pt aware that referral has been placed and some will call her with that appt.

## 2016-03-24 NOTE — Telephone Encounter (Signed)
Noted. plz notify patient cologuard returned positive - would offer referral to GI to discuss colonoscopy. Referral placed.

## 2016-03-24 NOTE — Addendum Note (Signed)
Addended by: Eustaquio BoydenGUTIERREZ, Rolland Steinert on: 03/24/2016 08:53 AM   Modules accepted: Orders

## 2016-03-25 ENCOUNTER — Encounter: Payer: Self-pay | Admitting: Gastroenterology

## 2016-03-25 ENCOUNTER — Telehealth: Payer: Self-pay | Admitting: Gastroenterology

## 2016-03-25 NOTE — Telephone Encounter (Signed)
Pt states she does not wish to be seen sooner.  She says she was constipated when she completed the test and will keep the 05/31/16 appt.  She will call if she sees any bleeding or develops any further symptoms.

## 2016-03-28 ENCOUNTER — Other Ambulatory Visit: Payer: Self-pay | Admitting: Family Medicine

## 2016-03-30 ENCOUNTER — Encounter: Payer: Self-pay | Admitting: Family Medicine

## 2016-04-04 ENCOUNTER — Other Ambulatory Visit: Payer: Self-pay | Admitting: Family Medicine

## 2016-04-04 ENCOUNTER — Other Ambulatory Visit: Payer: Self-pay | Admitting: *Deleted

## 2016-04-04 MED ORDER — ATORVASTATIN CALCIUM 40 MG PO TABS: 40.0000 mg | ORAL_TABLET | Freq: Every day | ORAL | Status: DC

## 2016-04-08 ENCOUNTER — Other Ambulatory Visit: Payer: Self-pay | Admitting: Family Medicine

## 2016-04-18 ENCOUNTER — Other Ambulatory Visit: Payer: Self-pay | Admitting: Family Medicine

## 2016-04-18 NOTE — Telephone Encounter (Signed)
Ok to refill 

## 2016-05-03 ENCOUNTER — Other Ambulatory Visit (HOSPITAL_COMMUNITY): Payer: Self-pay | Admitting: Interventional Radiology

## 2016-05-03 DIAGNOSIS — I771 Stricture of artery: Secondary | ICD-10-CM

## 2016-05-11 ENCOUNTER — Ambulatory Visit (HOSPITAL_COMMUNITY): Payer: Commercial Managed Care - HMO

## 2016-05-11 ENCOUNTER — Ambulatory Visit (HOSPITAL_COMMUNITY)
Admission: RE | Admit: 2016-05-11 | Discharge: 2016-05-11 | Disposition: A | Discharge status: Home / Self Care | Payer: Commercial Managed Care - HMO | Source: Ambulatory Visit | Attending: Interventional Radiology | Admitting: Interventional Radiology

## 2016-05-11 DIAGNOSIS — I771 Stricture of artery: Secondary | ICD-10-CM | POA: Diagnosis not present

## 2016-05-11 DIAGNOSIS — I671 Cerebral aneurysm, nonruptured: Secondary | ICD-10-CM | POA: Insufficient documentation

## 2016-05-11 DIAGNOSIS — I672 Cerebral atherosclerosis: Secondary | ICD-10-CM | POA: Insufficient documentation

## 2016-05-11 DIAGNOSIS — I708 Atherosclerosis of other arteries: Secondary | ICD-10-CM | POA: Diagnosis not present

## 2016-05-11 DIAGNOSIS — I669 Occlusion and stenosis of unspecified cerebral artery: Secondary | ICD-10-CM | POA: Diagnosis not present

## 2016-05-11 LAB — CREATININE, SERUM
Creatinine, Ser: 1.05 mg/dL — ABNORMAL HIGH (ref 0.44–1.00)
GFR calc Af Amer: >60 mL/min (ref >60)
GFR calc non Af Amer: 53 mL/min — ABNORMAL LOW (ref >60)

## 2016-05-11 MED ORDER — GADOBENATE DIMEGLUMINE 529 MG/ML IV SOLN
15.0000 mL | Freq: Once | INTRAVENOUS | Status: AC | PRN
  Administered 2016-05-11: 15 mL via INTRAVENOUS

## 2016-05-26 ENCOUNTER — Telehealth (HOSPITAL_COMMUNITY): Payer: Self-pay

## 2016-05-26 NOTE — Telephone Encounter (Signed)
Pt agreed to f/u in 6 months with MRI. AW 

## 2016-05-27 HISTORY — PX: COLONOSCOPY: SHX174

## 2016-05-31 ENCOUNTER — Encounter: Payer: Self-pay | Admitting: Gastroenterology

## 2016-05-31 ENCOUNTER — Ambulatory Visit (INDEPENDENT_AMBULATORY_CARE_PROVIDER_SITE_OTHER): Payer: Commercial Managed Care - HMO | Admitting: Gastroenterology

## 2016-05-31 VITALS — BP 110/70 | HR 76 | Ht 65.0 in | Wt 165.2 lb

## 2016-05-31 DIAGNOSIS — R195 Other fecal abnormalities: Secondary | ICD-10-CM | POA: Diagnosis not present

## 2016-05-31 DIAGNOSIS — K5901 Slow transit constipation: Secondary | ICD-10-CM

## 2016-05-31 DIAGNOSIS — J432 Centrilobular emphysema: Secondary | ICD-10-CM | POA: Diagnosis not present

## 2016-05-31 DIAGNOSIS — I6523 Occlusion and stenosis of bilateral carotid arteries: Secondary | ICD-10-CM

## 2016-05-31 DIAGNOSIS — K5730 Diverticulosis of large intestine without perforation or abscess without bleeding: Secondary | ICD-10-CM | POA: Diagnosis not present

## 2016-05-31 MED ORDER — NA SULFATE-K SULFATE-MG SULF 17.5-3.13-1.6 GM/177ML PO SOLN: 1.0000 | Freq: Once | ORAL | 0 refills | Status: AC

## 2016-05-31 NOTE — Patient Instructions (Signed)
If you are age 70 or older, your body mass index should be between 23-30. Your Body mass index is 27.49 kg/m. If this is out of the aforementioned range listed, please consider follow up with your Primary Care Provider.  If you are age 70 or younger, your body mass index should be between 19-25. Your Body mass index is 27.49 kg/m. If this is out of the aformentioned range listed, please consider follow up with your Primary Care Provider.   You have been scheduled for a colonoscopy. Please follow written instructions given to you at your visit today.  Please pick up your prep supplies at the pharmacy within the next 1-3 days. If you use inhalers (even only as needed), please bring them with you on the day of your procedure. Your physician has requested that you go to www.startemmi.com and enter the access code given to you at your visit today. This web site gives a general overview about your procedure. However, you should still follow specific instructions given to you by our office regarding your preparation for the procedure.  Thank you for choosing [facility]  Dr Amada JupiterHenry Danis III

## 2016-05-31 NOTE — Progress Notes (Signed)
[facility] Consult Note:  History: [name] [name] 05/31/2016  Referring physician: Ria Bush, MD  Reason for consult/chief complaint: Blood In Stools (Positive hemocults) and Constipation (chronic, bloating and gas)   Subjective  HPI:  [name] [name] was referred by primary care for heme positive stool. Her last colonoscopy with Dr. Sharlett Iles in 2004 revealed left-sided diverticulosis and internal hemorrhoids. She tends toward constipation with bloating and gas and a BM every other day and sometimes the need to strain. She has noticed no frank rectal bleeding, and she does check every day. Stool cards were recently positive. She denies any upper GI symptoms.  ROS:  Review of Systems  Constitutional: Positive for fatigue. Negative for appetite change and unexpected weight change.  HENT: Negative for mouth sores and voice change.   Eyes: Negative for pain and redness.  Respiratory: Positive for shortness of breath. Negative for cough.        Dyspnea with exertion, chronic and unchanged  Cardiovascular: Negative for chest pain and palpitations.  Genitourinary: Negative for dysuria and hematuria.  Musculoskeletal: Negative for arthralgias and myalgias.  Skin: Negative for pallor and rash.  Neurological: Positive for tremors and weakness. Negative for headaches.       Left hand and leg weakness after prior CVA  Hematological: Negative for adenopathy.     Past Medical History: Past Medical History:  Diagnosis Date  . Aneurysm ([place])   . Anxiety and depression   . Carotid stenosis    R 50% (12/2012)  . Concussion 08/03/2015  . COPD (chronic obstructive pulmonary disease) ([place]) 12/2012   spirometry: Pre: FVC 84%, FEV1 69%, ratio 0.64 consistent with moderate obstruction.  . Depression   . Fall 08/03/2015   d/c home health 08/2015  . Fracture of cervical vertebra, C5 ([facility]) 08/06/2015  . History of chicken pox   . Hyperlipidemia   . Hypertension   . Lower back pain     h/o HNP s/p surgery  . Osteoarthritis    h/o ruptured disc s/p ESI  . Osteoporosis 11/2010   DEXA -2.7 spine, thoracic compression fracture  . Peripheral vascular disease ([place])   . Smoker    quit 10/2012  . Stroke [facility]) 2010   x3 with residual R hemiparesis, s/p R MCA balloon angioplasty (2010)     Past Surgical History: Past Surgical History:  Procedure Laterality Date  . APPENDECTOMY  1960's  . CATARACT EXTRACTION     bilateral  . CESAREAN SECTION    . CHOLECYSTECTOMY  1970's  . COLONOSCOPY  2004   diverticulosis, no polyps Sharlett Iles)  . DEXA  11/2010   T -2.7 spine, -1.9 hip  . L hip surgery  2006   fractured - screws placed  . RADIOLOGY WITH ANESTHESIA N/A 11/11/2015   Procedure: RADIOLOGY WITH ANESTHESIA;  Surgeon: Luanne Bras, MD;  Location: [facility];  Service: Radiology;  Laterality: N/A;     Family History: Family History  Problem Relation Age of Onset  . CAD Mother     MI  . Cancer Mother     cervical  . CAD Maternal Aunt     MI  . CAD Sister     MI  . Sudden death Father 33    died in his sleep, chain smoker  . Cirrhosis Father   . Hypertension Daughter   . Diabetes Maternal Aunt   . Cancer Maternal Grandmother     cervical    Social History: Social History   Social History  . Marital  status: Divorced    Spouse name: N/A  . Number of children: N/A  . Years of education: N/A   Social History Main Topics  . Smoking status: Former Smoker    Packs/day: 1.00    Years: 45.00    Types: Cigarettes    Quit date: 11/08/2012  . Smokeless tobacco: Never Used  . Alcohol use No  . Drug use: No  . Sexual activity: Not Currently   Other Topics Concern  . None   Social History Narrative   Lives with granddaughter and great grandchildren   Occupation: retired, was Quarry manager   Edu: 8th grade   Activity: no regular activity:   Diet: some water, fruits/vegetables daily   Her daughter lives in the house behind her, and accompanies her to the visit  today.  Allergies: Allergies  Allergen Reactions  . Doxycycline Other (See Comments)    Sig GI upset    Outpatient Meds: Current Outpatient Prescriptions  Medication Sig Dispense Refill  . acetaminophen (TYLENOL) 500 MG tablet Take 500 mg by mouth 2 (two) times daily as needed for mild pain.     [name] albuterol (PROVENTIL HFA;VENTOLIN HFA) 108 (90 BASE) MCG/ACT inhaler Inhale 2 puffs into the lungs every 6 (six) hours as needed for wheezing or shortness of breath.     [name] alendronate (FOSAMAX) 70 MG tablet TAKE 1 TABLET WEEKLY. TAKE WITH A FULL GLASS OF WATER ON AN EMPTY STOMACH. 12 tablet 3  . aspirin EC 81 MG tablet Take 81 mg by mouth daily.    [name] atorvastatin (LIPITOR) 40 MG tablet Take 1 tablet (40 mg total) by mouth daily. 90 tablet 2  . benazepril (LOTENSIN) 10 MG tablet TAKE ONE TABLET BY MOUTH ONCE DAILY 90 tablet 3  . Calcium Carb-Cholecalciferol (CALCIUM-VITAMIN D) 600-400 MG-UNIT TABS Take 1 tablet by mouth daily.    . clopidogrel (PLAVIX) 75 MG tablet TAKE 1 TABLET EVERY DAY 90 tablet 1  . fexofenadine (ALLEGRA) 180 MG tablet Take 180 mg by mouth daily.    . sertraline (ZOLOFT) 100 MG tablet TAKE 1 TABLET EVERY DAY 90 tablet 3  . traMADol (ULTRAM) 50 MG tablet Take 1-2 tablets (50-100 mg total) by mouth every 6 (six) hours as needed (Pain). (Patient taking differently: Take 50 mg by mouth 3 (three) times daily as needed (Pain). ) 50 tablet 0  . traZODone (DESYREL) 100 MG tablet TAKE 1 TABLET AT BEDTIME AS NEEDED FOR SLEEP 90 tablet 3  . Na Sulfate-K Sulfate-Mg Sulf 17.5-3.13-1.6 GM/180ML SOLN Take 1 kit by mouth once. 354 mL 0   No current facility-administered medications for this visit.       ___________________________________________________________________ Objective   Exam:  BP 110/70 (BP Location: Left Arm, Patient Position: Sitting, Cuff Size: Normal)   Pulse 76   Ht 5' 5" (1.651 m)   Wt 165 lb 3.2 oz (74.9 kg)   BMI 27.49 kg/m    General: this is a(n) Elderly  woman with good muscle mass   Eyes: sclera anicteric, no redness  ENT: oral mucosa moist without lesions, no cervical or supraclavicular lymphadenopathy, good dentition  CV: RRR without murmur, S1/S2, no JVD, no peripheral edema  Resp: clear to auscultation bilaterally, normal RR and effort noted  GI: soft, no tenderness, with active bowel sounds. No guarding or palpable organomegaly noted.  Skin; warm and dry, no rash or jaundice noted  Neuro: awake, alert and oriented x 3. Slow gait, gets on the exam table with a little assistance.  Fine resting tremor both hands mildly decreased muscle strength left hand and arm  Labs:  CBC    Component Value Date/Time   WBC 7.3 11/10/2015 1021   RBC 4.71 11/10/2015 1021   HGB 13.7 11/10/2015 1021   HCT 42.7 11/10/2015 1021   PLT 160 11/10/2015 1021   MCV 90.7 11/10/2015 1021   MCH 29.1 11/10/2015 1021   MCHC 32.1 11/10/2015 1021   RDW 12.9 11/10/2015 1021   LYMPHSABS 1.2 11/10/2015 1021   MONOABS 0.6 11/10/2015 1021   EOSABS 0.1 11/10/2015 1021   BASOSABS 0.1 11/10/2015 1021   Stool cards positive 3   Assessment: Encounter Diagnoses  Name Primary?  . Heme positive stool Yes  . Slow transit constipation   . Diverticulosis of colon without hemorrhage   . Centrilobular emphysema ([employer])   . Carotid stenosis, bilateral     Significance of heme-positive stool uncertain, only symptom is chronic constipation. I advised her to have a colonoscopy to rule out neoplasia. She and her daughter feel that it would be difficult for her to undergo the preparation, but with the daughter's assistance to believe it is possible.  Plan:  Colonoscopy Stop Plavix 5 days prior, stay on aspirin  The benefits and risks of the planned procedure were described in detail with the patient or (when appropriate) their health care proxy.  Risks were outlined as including, but not limited to, bleeding, infection, perforation, adverse medication reaction  leading to cardiac or pulmonary decompensation, or pancreatitis (if ERCP).  The limitation of incomplete mucosal visualization was also discussed.  No guarantees or warranties were given.  Continue current bowel regimen  Thank you for the courtesy of this consult.  Please call me with any questions or concerns.  Nelida Meuse III  CC: Ria Bush, MD

## 2016-06-23 ENCOUNTER — Encounter: Payer: Self-pay | Admitting: Gastroenterology

## 2016-06-23 ENCOUNTER — Ambulatory Visit (AMBULATORY_SURGERY_CENTER): Payer: Commercial Managed Care - HMO | Admitting: Gastroenterology

## 2016-06-23 VITALS — BP 112/52 | HR 78 | Temp 97.3°F | Resp 16 | Ht 65.0 in | Wt 165.0 lb

## 2016-06-23 DIAGNOSIS — I6529 Occlusion and stenosis of unspecified carotid artery: Secondary | ICD-10-CM | POA: Diagnosis not present

## 2016-06-23 DIAGNOSIS — I739 Peripheral vascular disease, unspecified: Secondary | ICD-10-CM | POA: Diagnosis not present

## 2016-06-23 DIAGNOSIS — K5900 Constipation, unspecified: Secondary | ICD-10-CM | POA: Diagnosis not present

## 2016-06-23 DIAGNOSIS — R195 Other fecal abnormalities: Secondary | ICD-10-CM

## 2016-06-23 DIAGNOSIS — J449 Chronic obstructive pulmonary disease, unspecified: Secondary | ICD-10-CM | POA: Diagnosis not present

## 2016-06-23 DIAGNOSIS — Z8673 Personal history of transient ischemic attack (TIA), and cerebral infarction without residual deficits: Secondary | ICD-10-CM | POA: Diagnosis not present

## 2016-06-23 DIAGNOSIS — F341 Dysthymic disorder: Secondary | ICD-10-CM | POA: Diagnosis not present

## 2016-06-23 DIAGNOSIS — I10 Essential (primary) hypertension: Secondary | ICD-10-CM | POA: Diagnosis not present

## 2016-06-23 MED ORDER — SODIUM CHLORIDE 0.9 % IV SOLN: 500.0000 mL | INTRAVENOUS | Status: DC

## 2016-06-23 NOTE — Op Note (Signed)
[facility] Patient Name: [name] [name] Procedure Date: 06/23/2016 1:13 PM MRN: [id number] Endoscopist: Sherilyn Cooter L. Myrtie Neither , MD Age: 70 Referring MD:  Date of Birth: 1945-12-16 Gender: Female Account #: [id number] Procedure:                Colonoscopy Indications:              Heme positive stool with normal hemoglobin Medicines:                Monitored Anesthesia Care Procedure:                Pre-Anesthesia Assessment:                           - Prior to the procedure, a History and Physical                            was performed, and patient medications and                            allergies were reviewed. The patient's tolerance of                            previous anesthesia was also reviewed. The risks                            and benefits of the procedure and the sedation                            options and risks were discussed with the patient.                            All questions were answered, and informed consent                            was obtained. Prior Anticoagulants: The patient has                            taken Plavix (clopidogrel), last dose was 5 days                            prior to procedure. ASA Grade Assessment: III - A                            patient with severe systemic disease. After                            reviewing the risks and benefits, the patient was                            deemed in satisfactory condition to undergo the                            procedure.  After obtaining informed consent, the colonoscope                            was passed under direct vision. Throughout the                            procedure, the patient's blood pressure, pulse, and                            oxygen saturations were monitored continuously. The                            Model CF-HQ190L [id number]) scope was introduced                            through the anus and advanced to the the cecum,                             identified by appendiceal orifice and ileocecal                            valve. The colonoscopy was performed without                            difficulty. The patient tolerated the procedure                            well. The quality of the bowel preparation was                            good. The ileocecal valve, appendiceal orifice, and                            rectum were photographed. The quality of the bowel                            preparation was evaluated using the BBPS [facility]                            Bowel Preparation Scale) with scores of: Right                            Colon = 2, Transverse Colon = 2 and Left Colon = 2.                            The total BBPS score equals 6. The bowel                            preparation used was SUPREP. Scope In: 2:10:20 PM Scope Out: 2:23:19 PM Scope Withdrawal Time: 0 hours 8 minutes 40 seconds  Total Procedure Duration: 0 hours 12 minutes 59 seconds  Findings:                 The digital  rectal exam findings include decreased                            sphincter tone.                           Many large-mouthed diverticula were found in the                            left colon.                           The exam was otherwise without abnormality on                            direct and retroflexion views. Complications:            No immediate complications. Estimated Blood Loss:     Estimated blood loss: none. Impression:               - Decreased sphincter tone found on digital rectal                            exam.                           - Diverticulosis in the left colon.                           - The examination was otherwise normal on direct                            and retroflexion views.                           - No specimens collected. Recommendation:           - Patient has a contact number available for                            emergencies. The signs and symptoms of  potential                            delayed complications were discussed with the                            patient. Return to normal activities tomorrow.                            Written discharge instructions were provided to the                            patient.                           - Resume previous diet.                           -  Resume aspirin today and Plavix (clopidogrel)                            today at prior doses.                           - No recommendation at this time regarding repeat                            colonoscopy due to age.                           - Return to primary care physician as previously                            scheduled.                           If patient develops iron deficiency anemia, please                            refer back to GI. Lashane Whelpley L. Myrtie Neither, MD 06/23/2016 2:28:10 PM This report has been signed electronically.

## 2016-06-23 NOTE — Progress Notes (Signed)
Pt. Noted  Abrasions on left forearm,she stated that her dog scratched her arm.

## 2016-06-23 NOTE — Progress Notes (Signed)
To PACU  Pt awakea and alert.  Report to RN

## 2016-06-23 NOTE — Patient Instructions (Signed)
YOU HAD AN ENDOSCOPIC PROCEDURE TODAY AT THE [facility]:   Refer to the procedure report that was given to you for any specific questions about what was found during the examination.  If the procedure report does not answer your questions, please call your gastroenterologist to clarify.  If you requested that your care partner not be given the details of your procedure findings, then the procedure report has been included in a sealed envelope for you to review at your convenience later.  YOU SHOULD EXPECT: Some feelings of bloating in the abdomen. Passage of more gas than usual.  Walking can help get rid of the air that was put into your GI tract during the procedure and reduce the bloating. If you had a lower endoscopy (such as a colonoscopy or flexible sigmoidoscopy) you may notice spotting of blood in your stool or on the toilet paper. If you underwent a bowel prep for your procedure, you may not have a normal bowel movement for a few days.  Please Note:  You might notice some irritation and congestion in your nose or some drainage.  This is from the oxygen used during your procedure.  There is no need for concern and it should clear up in a day or so.  SYMPTOMS TO REPORT IMMEDIATELY:   Following lower endoscopy (colonoscopy or flexible sigmoidoscopy):  Excessive amounts of blood in the stool  Significant tenderness or worsening of abdominal pains  Swelling of the abdomen that is new, acute  Fever of 100F or higher  For urgent or emergent issues, a gastroenterologist can be reached at any hour by calling [phone].   DIET:  We do recommend a small meal at first, but then you may proceed to your regular diet.  Drink plenty of fluids but you should avoid alcoholic beverages for 24 hours.  May resume ASA, Plavix and ibuprofen today.  ACTIVITY:  You should plan to take it easy for the rest of today and you should NOT DRIVE or use heavy machinery until tomorrow (because of the  sedation medicines used during the test).    FOLLOW UP: Our staff will call the number listed on your records the next business day following your procedure to check on you and address any questions or concerns that you may have regarding the information given to you following your procedure. If we do not reach you, we will leave a message.  However, if you are feeling well and you are not experiencing any problems, there is no need to return our call.  We will assume that you have returned to your regular daily activities without incident.  If any biopsies were taken you will be contacted by phone or by letter within the next 1-3 weeks.  Please call us at [phone] if you have not heard about the biopsies in 3 weeks.    SIGNATURES/CONFIDENTIALITY: You and/or your care partner have signed paperwork which will be entered into your electronic medical record.  These signatures attest to the fact that that the information above on your After Visit Summary has been reviewed and is understood.  Full responsibility of the confidentiality of this discharge information lies with you and/or your care-partner.  Thank you for letting us take care of your healthcare needs.

## 2016-06-24 ENCOUNTER — Telehealth: Payer: Self-pay | Admitting: *Deleted

## 2016-06-24 ENCOUNTER — Encounter: Payer: Self-pay | Admitting: Family Medicine

## 2016-06-24 NOTE — Telephone Encounter (Signed)
  Follow up Call-  Call back number 06/23/2016  Post procedure Call Back phone  # [phone]  Permission to leave phone message Yes  Some recent data might be hidden     Patient questions:  Do you have a fever, pain , or abdominal swelling? No. Pain Score  0 *  Have you tolerated food without any problems? Yes.    Have you been able to return to your normal activities? Yes.    Do you have any questions about your discharge instructions: Diet   No. Medications  No. Follow up visit  No.  Do you have questions or concerns about your Care? No.  Actions: * If pain score is 4 or above: No action needed, pain <4.

## 2016-07-06 ENCOUNTER — Encounter: Payer: Self-pay | Admitting: Family Medicine

## 2016-10-11 ENCOUNTER — Other Ambulatory Visit: Payer: Self-pay | Admitting: Family Medicine

## 2016-12-19 ENCOUNTER — Other Ambulatory Visit: Payer: Self-pay | Admitting: Family Medicine

## 2016-12-19 NOTE — Telephone Encounter (Deleted)
Ok to refill? Last filled 04/18/16 #90 3RF

## 2017-01-10 ENCOUNTER — Other Ambulatory Visit: Payer: Self-pay | Admitting: *Deleted

## 2017-01-10 MED ORDER — TRAZODONE HCL 100 MG PO TABS: 100.0000 mg | ORAL_TABLET | Freq: Every evening | ORAL | 3 refills | Status: DC | PRN

## 2017-01-10 NOTE — Telephone Encounter (Signed)
Ok to refill? Last filled 04/18/16 #90 3RF

## 2017-02-22 ENCOUNTER — Other Ambulatory Visit: Payer: Self-pay | Admitting: Family Medicine

## 2017-03-01 ENCOUNTER — Other Ambulatory Visit: Payer: Self-pay | Admitting: Family Medicine

## 2017-04-15 ENCOUNTER — Other Ambulatory Visit: Payer: Self-pay | Admitting: Family Medicine

## 2017-06-20 ENCOUNTER — Other Ambulatory Visit: Payer: Self-pay | Admitting: Internal Medicine

## 2017-06-20 ENCOUNTER — Other Ambulatory Visit: Payer: Self-pay | Admitting: Family Medicine

## 2017-06-25 ENCOUNTER — Other Ambulatory Visit: Payer: Self-pay | Admitting: Family Medicine

## 2017-06-25 DIAGNOSIS — E559 Vitamin D deficiency, unspecified: Secondary | ICD-10-CM

## 2017-06-25 DIAGNOSIS — E785 Hyperlipidemia, unspecified: Secondary | ICD-10-CM

## 2017-06-25 DIAGNOSIS — I10 Essential (primary) hypertension: Secondary | ICD-10-CM

## 2017-06-26 ENCOUNTER — Ambulatory Visit (INDEPENDENT_AMBULATORY_CARE_PROVIDER_SITE_OTHER): Payer: Commercial Managed Care - HMO

## 2017-06-26 VITALS — BP 124/70 | HR 66 | Temp 97.5°F | Ht 60.0 in | Wt 159.0 lb

## 2017-06-26 DIAGNOSIS — I10 Essential (primary) hypertension: Secondary | ICD-10-CM

## 2017-06-26 DIAGNOSIS — Z23 Encounter for immunization: Secondary | ICD-10-CM | POA: Diagnosis not present

## 2017-06-26 DIAGNOSIS — Z0000 Encounter for general adult medical examination without abnormal findings: Secondary | ICD-10-CM | POA: Diagnosis not present

## 2017-06-26 DIAGNOSIS — E559 Vitamin D deficiency, unspecified: Secondary | ICD-10-CM

## 2017-06-26 DIAGNOSIS — E785 Hyperlipidemia, unspecified: Secondary | ICD-10-CM

## 2017-06-26 LAB — BASIC METABOLIC PANEL
BUN: 21 mg/dL (ref 6–23)
CO2: 30 mEq/L (ref 19–32)
Calcium: 9.9 mg/dL (ref 8.4–10.5)
Chloride: 102 mEq/L (ref 96–112)
Creatinine, Ser: 0.84 mg/dL (ref 0.40–1.20)
GFR: 71.09 mL/min (ref >60.00)
Glucose, Bld: 90 mg/dL (ref 70–99)
Potassium: 4.5 mEq/L (ref 3.5–5.1)
Sodium: 139 mEq/L (ref 135–145)

## 2017-06-26 LAB — LIPID PANEL
Cholesterol: 188 mg/dL (ref 0–200)
HDL: 65.8 mg/dL (ref >39.00)
LDL Cholesterol: 107 mg/dL — ABNORMAL HIGH (ref 0–99)
NonHDL: 122.46
Total CHOL/HDL Ratio: 3
Triglycerides: 78 mg/dL (ref 0.0–149.0)
VLDL: 15.6 mg/dL (ref 0.0–40.0)

## 2017-06-26 LAB — VITAMIN D 25 HYDROXY (VIT D DEFICIENCY, FRACTURES): VITD: 25.88 ng/mL — ABNORMAL LOW (ref 30.00–100.00)

## 2017-06-26 LAB — TSH: TSH: 6.93 u[IU]/mL — ABNORMAL HIGH (ref 0.35–4.50)

## 2017-06-26 NOTE — Progress Notes (Signed)
Pre visit review using our clinic review tool, if applicable. No additional management support is needed unless otherwise documented below in the visit note. 

## 2017-06-26 NOTE — Progress Notes (Signed)
Subjective:   [name] [name] is a 71 y.o. female who presents for Medicare Annual (Subsequent) preventive examination.  Review of Systems:  N/A Cardiac Risk Factors include: advanced age (>12men, >41 women);obesity (BMI >30kg/m2);dyslipidemia;hypertension     Objective:     Vitals: BP 124/70 (BP Location: Right Arm, Patient Position: Sitting, Cuff Size: Normal)   Pulse 66   Temp (!) 97.5 F (36.4 C) (Oral)   Ht 5' (1.524 m) Comment: no shoes  Wt 159 lb (72.1 kg)   SpO2 97%   BMI 31.05 kg/m   Body mass index is 31.05 kg/m.   Tobacco History  Smoking Status  . Former Smoker  . Packs/day: 1.00  . Years: 45.00  . Types: Cigarettes  . Quit date: 11/08/2012  Smokeless Tobacco  . Never Used     Counseling given: No   Past Medical History:  Diagnosis Date  . Aneurysm (HCC)   . Anxiety   . Anxiety and depression   . Carotid stenosis    R 50% (12/2012)  . Concussion 08/03/2015  . COPD (chronic obstructive pulmonary disease) (HCC) 12/2012   spirometry: Pre: FVC 84%, FEV1 69%, ratio 0.64 consistent with moderate obstruction.  . Depression   . Fall 08/03/2015   d/c home health 08/2015  . Fracture of cervical vertebra, C5 (HCC) 08/06/2015  . History of chicken pox   . Hyperlipidemia   . Hypertension   . Lower back pain    h/o HNP s/p surgery  . Osteoarthritis    h/o ruptured disc s/p ESI  . Osteoporosis 11/2010   DEXA -2.7 spine, thoracic compression fracture  . Peripheral vascular disease (HCC)   . Smoker    quit 10/2012  . Stroke [facility]) 2010   x3 with residual R hemiparesis, s/p R MCA balloon angioplasty (2010)   Past Surgical History:  Procedure Laterality Date  . APPENDECTOMY  1960  . CATARACT EXTRACTION     bilateral  . CESAREAN SECTION    . CHOLECYSTECTOMY  1970  . COLONOSCOPY  2004   diverticulosis, no polyps Jarold Motto)  . COLONOSCOPY  05/2016   decreased sphincter tone, diverticulosis, no f/u recommended ([facility])  . DEXA  11/2010   T -2.7 spine,  -1.9 hip  . HIP SURGERY Left 2006   fractured - screws placed  . RADIOLOGY WITH ANESTHESIA N/A 11/11/2015   Procedure: RADIOLOGY WITH ANESTHESIA;  Surgeon: Julieanne Cotton, MD;  Location: [facility] OR;  Service: Radiology;  Laterality: N/A;   Family History  Problem Relation Age of Onset  . CAD Mother        MI  . Cancer Mother        cervical  . CAD Maternal Aunt        MI  . CAD Sister        MI  . Sudden death Father 62       died in his sleep, chain smoker  . Cirrhosis Father   . Hypertension Daughter   . Diabetes Maternal Aunt   . Cancer Maternal Grandmother        cervical   History  Sexual Activity  . Sexual activity: Not Currently    Outpatient Encounter Prescriptions as of 06/26/2017  Medication Sig  . acetaminophen (TYLENOL) 500 MG tablet Take 500 mg by mouth 2 (two) times daily as needed for mild pain.   [name] albuterol (PROVENTIL HFA;VENTOLIN HFA) 108 (90 BASE) MCG/ACT inhaler Inhale 2 puffs into the lungs every 6 (six) hours as needed  for wheezing or shortness of breath.   [name] alendronate (FOSAMAX) 70 MG tablet TAKE 1 TABLET WEEKLY  WITH A FULL GLASS OF WATER ON AN EMPTY STOMACH  . aspirin EC 81 MG tablet Take 81 mg by mouth daily.  [name] atorvastatin (LIPITOR) 40 MG tablet Take 1 tablet (40 mg total) by mouth daily.  . benazepril (LOTENSIN) 10 MG tablet Take 1 tablet (10 mg total) by mouth daily. PLEASE SCHEDULE MEDICARE WELLNESS EXAM  . Calcium Carb-Cholecalciferol (CALCIUM-VITAMIN D) 600-400 MG-UNIT TABS Take 1 tablet by mouth daily.  . clopidogrel (PLAVIX) 75 MG tablet TAKE 1 TABLET EVERY DAY  . fexofenadine (ALLEGRA) 180 MG tablet Take 180 mg by mouth daily.  . sertraline (ZOLOFT) 100 MG tablet Take 1 tablet (100 mg total) by mouth daily. MUST SCHEDULE OFFICE VISIT FOR MORE REFILLS  . traMADol (ULTRAM) 50 MG tablet Take 1-2 tablets (50-100 mg total) by mouth every 6 (six) hours as needed (Pain). (Patient taking differently: Take 50 mg by mouth 3 (three) times daily as needed  (Pain). )  . traZODone (DESYREL) 100 MG tablet Take 1 tablet (100 mg total) by mouth at bedtime as needed. for sleep   Facility-Administered Encounter Medications as of 06/26/2017  Medication  . 0.9 %  sodium chloride infusion    Activities of Daily Living In your present state of health, do you have any difficulty performing the following activities: 06/26/2017  Hearing? N  Vision? N  Difficulty concentrating or making decisions? N  Walking or climbing stairs? Y  Dressing or bathing? N  Doing errands, shopping? Y  Preparing Food and eating ? N  Using the Toilet? N  In the past six months, have you accidently leaked urine? Y  Do you have problems with loss of bowel control? N  Managing your Medications? N  Managing your Finances? N  Housekeeping or managing your Housekeeping? N  Some recent data might be hidden    Patient Care Team: [name], MD as PCP - General (Family Medicine)    Assessment:     Hearing Screening             Right ear:   0 40 40  0    Left ear:   40 0 40  0    Vision Screening Comments: Last vision exam in Oct 2017    Exercise Activities and Dietary recommendations Current Exercise Habits: The patient does not participate in regular exercise at present, Exercise limited by: None identified  Goals    . health management          Starting 06/26/2017, I will continue to take medications as prescribed.       Fall Risk Fall Risk  06/26/2017 02/01/2016 01/29/2015 01/02/2014 01/02/2014  Falls in the past year? No Yes Yes Yes Yes  Number falls in past yr: - or more 2 or more  Injury with Fall? - Yes No - -  Risk Factor Category  - - - High Fall Risk High Fall Risk  Risk for fall due to : - - - Impaired balance/gait Impaired vision   Depression Screen PHQ 2/9 Scores 06/26/2017 02/01/2016 01/29/2015 01/02/2014  PHQ - 2 Score 1 0 3 2  PHQ- 9 Score 2 - 16 9     Cognitive Function MMSE - Mini Mental  State Exam 06/26/2017  Orientation to time 5  Orientation to Place 5  Registration 3  Attention/ Calculation 0  Recall 2  Recall-comments pt was unable  to recall 1 of 3 words  Language- name 2 objects 0  Language- repeat 1  Language- follow 3 step command 0  Language- follow 3 step command-comments pt was unable to follow 3 steps of 3 steps command  Language- read & follow direction 0  Write a sentence 0  Copy design 0  Total score 16     PLEASE NOTE: A Mini-Cog screen was completed. Maximum score is 20. A value of 0 denotes this part of Folstein MMSE was not completed or the patient failed this part of the Mini-Cog screening.   Mini-Cog Screening Orientation to Time - Max 5 pts Orientation to Place - Max 5 pts Registration - Max 3 pts Recall - Max 3 pts Language Repeat - Max 1 pts Language Follow 3 Step Command - Max 3 pts     Immunization History  Administered Date(s) Administered  . Influenza,inj,Quad PF,6+ Mos 07/04/2013, 08/03/2015, 06/26/2017  . Pneumococcal Conjugate-13 01/02/2014  . Pneumococcal Polysaccharide-23 11/26/2012  . Td 09/26/2004  . Tdap 08/06/2015   Screening Tests Health Maintenance  Topic Date Due  . MAMMOGRAM  12/25/2018  . DTaP/Tdap/Td (2 - Td) 08/05/2025  . TETANUS/TDAP  08/05/2025  . INFLUENZA VACCINE  Completed  . DEXA SCAN  Completed  . Hepatitis C Screening  Completed  . PNA vac Low Risk Adult  Completed      Plan:     I have personally reviewed and addressed the Medicare Annual Wellness questionnaire and have noted the following in the patient's chart:  A. Medical and social history B. Use of alcohol, tobacco or illicit drugs  C. Current medications and supplements D. Functional ability and status E.  Nutritional status F.  Physical activity G. Advance directives H. List of other physicians I.  Hospitalizations, surgeries, and ER visits in previous 12 months J.  Vitals K. Screenings to include hearing, vision, cognitive,  depression L. Referrals and appointments - none  In addition, I have reviewed and discussed with patient certain preventive protocols, quality metrics, and best practice recommendations. A written personalized care plan for preventive services as well as general preventive health recommendations were provided to patient.  See attached scanned questionnaire for additional information.   Signed,   Randa Evens, MHA, BS, LPN Health Coach

## 2017-06-26 NOTE — Progress Notes (Signed)
I reviewed health advisor's note, was available for consultation, and agree with documentation and plan.  

## 2017-06-26 NOTE — Patient Instructions (Signed)
Preventive Care for Adults  A healthy lifestyle and preventive care can promote health and wellness. Preventive health guidelines for adults include the following key practices.  . A routine yearly physical is a good way to check with your health care provider about your health and preventive screening. It is a chance to share any concerns and updates on your health and to receive a thorough exam.  . Visit your dentist for a routine exam and preventive care every 6 months. Brush your teeth twice a day and floss once a day. Good oral hygiene prevents tooth decay and gum disease.  . The frequency of eye exams is based on your age, health, family medical history, use  of contact lenses, and other factors. Follow your health care provider's ecommendations for frequency of eye exams.  . Eat a healthy diet. Foods like vegetables, fruits, whole grains, low-fat dairy products, and lean protein foods contain the nutrients you need without too many calories. Decrease your intake of foods high in solid fats, added sugars, and salt. Eat the right amount of calories for you. Get information about a proper diet from your health care provider, if necessary.  . Regular physical exercise is one of the most important things you can do for your health. Most adults should get at least 150 minutes of moderate-intensity exercise (any activity that increases your heart rate and causes you to sweat) each week. In addition, most adults need muscle-strengthening exercises on 2 or more days a week.  Silver Sneakers may be a benefit available to you. To determine eligibility, you may visit the website: www.silversneakers.com or contact program at 1-866-584-7389 Mon-Fri between 8AM-8PM.   . Maintain a healthy weight. The body mass index (BMI) is a screening tool to identify possible weight problems. It provides an estimate of body fat based on height and weight. Your health care provider can find your BMI and can help you  achieve or maintain a healthy weight.   For adults 20 years and older: ? A BMI below 18.5 is considered underweight. ? A BMI of 18.5 to 24.9 is normal. ? A BMI of 25 to 29.9 is considered overweight. ? A BMI of 30 and above is considered obese.   . Maintain normal blood lipids and cholesterol levels by exercising and minimizing your intake of saturated fat. Eat a balanced diet with plenty of fruit and vegetables. Blood tests for lipids and cholesterol should begin at age 20 and be repeated every 5 years. If your lipid or cholesterol levels are high, you are over 50, or you are at high risk for heart disease, you may need your cholesterol levels checked more frequently. Ongoing high lipid and cholesterol levels should be treated with medicines if diet and exercise are not working.  . If you smoke, find out from your health care provider how to quit. If you do not use tobacco, please do not start.  . If you choose to drink alcohol, please do not consume more than 2 drinks per day. One drink is considered to be 12 ounces (355 mL) of beer, 5 ounces (148 mL) of wine, or 1.5 ounces (44 mL) of liquor.  . If you are 55-79 years old, ask your health care provider if you should take aspirin to prevent strokes.  . Use sunscreen. Apply sunscreen liberally and repeatedly throughout the day. You should seek shade when your shadow is shorter than you. Protect yourself by wearing long sleeves, pants, a wide-brimmed hat, and sunglasses year   round, whenever you are outdoors.  . Once a month, do a whole body skin exam, using a mirror to look at the skin on your back. Tell your health care provider of new moles, moles that have irregular borders, moles that are larger than a pencil eraser, or moles that have changed in shape or color.     

## 2017-06-26 NOTE — Progress Notes (Signed)
PCP notes:   Health maintenance:  Mammogram - per pt, screening at [facility] in March 2018 Flu vaccine - administered  Abnormal screenings:   Mini-Cog score: 16 Depression score: 2 Hearing - failed  Hearing Screening             Right ear:   0 40 40  0    Left ear:   40 0 40  0     Patient concerns:   None  Nurse concerns:  None  Next PCP appt:   06/28/17 @ 1230

## 2017-06-27 ENCOUNTER — Other Ambulatory Visit (INDEPENDENT_AMBULATORY_CARE_PROVIDER_SITE_OTHER): Payer: Commercial Managed Care - HMO

## 2017-06-27 DIAGNOSIS — R7989 Other specified abnormal findings of blood chemistry: Secondary | ICD-10-CM

## 2017-06-27 LAB — T4, FREE: Free T4: 0.87 ng/dL (ref 0.60–1.60)

## 2017-06-28 ENCOUNTER — Encounter: Payer: Self-pay | Admitting: Family Medicine

## 2017-06-28 ENCOUNTER — Telehealth: Payer: Self-pay | Admitting: Family Medicine

## 2017-06-28 ENCOUNTER — Ambulatory Visit (INDEPENDENT_AMBULATORY_CARE_PROVIDER_SITE_OTHER): Payer: Medicare HMO | Admitting: Family Medicine

## 2017-06-28 VITALS — BP 118/60 | HR 70 | Temp 98.2°F | Ht 60.5 in | Wt 158.5 lb

## 2017-06-28 DIAGNOSIS — I10 Essential (primary) hypertension: Secondary | ICD-10-CM

## 2017-06-28 DIAGNOSIS — I720 Aneurysm of carotid artery: Secondary | ICD-10-CM

## 2017-06-28 DIAGNOSIS — I739 Peripheral vascular disease, unspecified: Secondary | ICD-10-CM | POA: Diagnosis not present

## 2017-06-28 DIAGNOSIS — E039 Hypothyroidism, unspecified: Secondary | ICD-10-CM

## 2017-06-28 DIAGNOSIS — F331 Major depressive disorder, recurrent, moderate: Secondary | ICD-10-CM

## 2017-06-28 DIAGNOSIS — G4700 Insomnia, unspecified: Secondary | ICD-10-CM

## 2017-06-28 DIAGNOSIS — I6523 Occlusion and stenosis of bilateral carotid arteries: Secondary | ICD-10-CM

## 2017-06-28 DIAGNOSIS — J432 Centrilobular emphysema: Secondary | ICD-10-CM | POA: Diagnosis not present

## 2017-06-28 DIAGNOSIS — I69351 Hemiplegia and hemiparesis following cerebral infarction affecting right dominant side: Secondary | ICD-10-CM

## 2017-06-28 DIAGNOSIS — M1990 Unspecified osteoarthritis, unspecified site: Secondary | ICD-10-CM

## 2017-06-28 DIAGNOSIS — Z8673 Personal history of transient ischemic attack (TIA), and cerebral infarction without residual deficits: Secondary | ICD-10-CM | POA: Diagnosis not present

## 2017-06-28 DIAGNOSIS — Z87891 Personal history of nicotine dependence: Secondary | ICD-10-CM | POA: Diagnosis not present

## 2017-06-28 DIAGNOSIS — Z7189 Other specified counseling: Secondary | ICD-10-CM

## 2017-06-28 DIAGNOSIS — Z0001 Encounter for general adult medical examination with abnormal findings: Secondary | ICD-10-CM | POA: Diagnosis not present

## 2017-06-28 DIAGNOSIS — M810 Age-related osteoporosis without current pathological fracture: Secondary | ICD-10-CM

## 2017-06-28 DIAGNOSIS — Z0000 Encounter for general adult medical examination without abnormal findings: Secondary | ICD-10-CM

## 2017-06-28 DIAGNOSIS — E785 Hyperlipidemia, unspecified: Secondary | ICD-10-CM | POA: Diagnosis not present

## 2017-06-28 MED ORDER — SERTRALINE HCL 100 MG PO TABS: 100.0000 mg | ORAL_TABLET | Freq: Every day | ORAL | 3 refills | Status: DC

## 2017-06-28 MED ORDER — SERTRALINE HCL 100 MG PO TABS: 150.0000 mg | ORAL_TABLET | Freq: Every day | ORAL | 3 refills | Status: DC

## 2017-06-28 MED ORDER — BENAZEPRIL HCL 10 MG PO TABS: 10.0000 mg | ORAL_TABLET | Freq: Every day | ORAL | 3 refills | Status: DC

## 2017-06-28 MED ORDER — TRAZODONE HCL 100 MG PO TABS: 150.0000 mg | ORAL_TABLET | Freq: Every day | ORAL | 3 refills | Status: DC

## 2017-06-28 MED ORDER — LEVOTHYROXINE SODIUM 25 MCG PO TABS: 25.0000 ug | ORAL_TABLET | Freq: Every day | ORAL | 3 refills | Status: DC

## 2017-06-28 MED ORDER — TRAMADOL HCL 50 MG PO TABS: 50.0000 mg | ORAL_TABLET | Freq: Two times a day (BID) | ORAL | 0 refills | Status: DC | PRN

## 2017-06-28 NOTE — Assessment & Plan Note (Signed)
Ongoing

## 2017-06-28 NOTE — Assessment & Plan Note (Signed)
Interested in lung cancer screening - will refer.

## 2017-06-28 NOTE — Patient Instructions (Addendum)
Call [facility] to schedule mammogram.  We will schedule repeat bone density scan.  We will refer you to lung cancer screening nurse to discuss pros/cons [facility]). We have been on fosamax for 5 years - we will check into prolia insurance coverage for you and let you know.  If interested, check with pharmacy about new 2 shot shingles series (shingrix).  Continue current medicine doses.  Add on low dose thyroid medicine - take first thing in the morning with glass of water, separate from calcium and other medicines by at least 1 hour. This was sent in to [facility] locally.  Call Dr Corliss Skains for follow up visit.  Return in 2-3 months for labs and office visit.   Health Maintenance, Female Adopting a healthy lifestyle and getting preventive care can go a long way to promote health and wellness. Talk with your health care provider about what schedule of regular examinations is right for you. This is a good chance for you to check in with your provider about disease prevention and staying healthy. In between checkups, there are plenty of things you can do on your own. Experts have done a lot of research about which lifestyle changes and preventive measures are most likely to keep you healthy. Ask your health care provider for more information. Weight and diet Eat a healthy diet  Be sure to include plenty of vegetables, fruits, low-fat dairy products, and lean protein.  Do not eat a lot of foods high in solid fats, added sugars, or salt.  Get regular exercise. This is one of the most important things you can do for your health. ? Most adults should exercise for at least 150 minutes each week. The exercise should increase your heart rate and make you sweat (moderate-intensity exercise). ? Most adults should also do strengthening exercises at least twice a week. This is in addition to the moderate-intensity exercise.  Maintain a healthy weight  Body mass index (BMI) is a measurement that can be used  to identify possible weight problems. It estimates body fat based on height and weight. Your health care provider can help determine your BMI and help you achieve or maintain a healthy weight.  For females 76 years of age and older: ? A BMI below 18.5 is considered underweight. ? A BMI of 18.5 to 24.9 is normal. ? A BMI of 25 to 29.9 is considered overweight. ? A BMI of 30 and above is considered obese.  Watch levels of cholesterol and blood lipids  You should start having your blood tested for lipids and cholesterol at 71 years of age, then have this test every 5 years.  You may need to have your cholesterol levels checked more often if: ? Your lipid or cholesterol levels are high. ? You are older than 71 years of age. ? You are at high risk for heart disease.  Cancer screening Lung Cancer  Lung cancer screening is recommended for adults 57-39 years old who are at high risk for lung cancer because of a history of smoking.  A yearly low-dose CT scan of the lungs is recommended for people who: ? Currently smoke. ? Have quit within the past 15 years. ? Have at least a 30-pack-year history of smoking. A pack year is smoking an average of one pack of cigarettes a day for 1 year.  Yearly screening should continue until it has been 15 years since you quit.  Yearly screening should stop if you develop a health problem that would prevent  you from having lung cancer treatment.  Breast Cancer  Practice breast self-awareness. This means understanding how your breasts normally appear and feel.  It also means doing regular breast self-exams. Let your health care provider know about any changes, no matter how small.  If you are in your 20s or 30s, you should have a clinical breast exam (CBE) by a health care provider every 1-3 years as part of a regular health exam.  If you are 62 or older, have a CBE every year. Also consider having a breast X-ray (mammogram) every year.  If you have a  family history of breast cancer, talk to your health care provider about genetic screening.  If you are at high risk for breast cancer, talk to your health care provider about having an MRI and a mammogram every year.  Breast cancer gene (BRCA) assessment is recommended for women who have family members with BRCA-related cancers. BRCA-related cancers include: ? Breast. ? Ovarian. ? Tubal. ? Peritoneal cancers.  Results of the assessment will determine the need for genetic counseling and BRCA1 and BRCA2 testing.  Cervical Cancer Your health care provider may recommend that you be screened regularly for cancer of the pelvic organs (ovaries, uterus, and vagina). This screening involves a pelvic examination, including checking for microscopic changes to the surface of your cervix (Pap test). You may be encouraged to have this screening done every 3 years, beginning at age 41.  For women ages 75-65, health care providers may recommend pelvic exams and Pap testing every 3 years, or they may recommend the Pap and pelvic exam, combined with testing for human papilloma virus (HPV), every 5 years. Some types of HPV increase your risk of cervical cancer. Testing for HPV may also be done on women of any age with unclear Pap test results.  Other health care providers may not recommend any screening for nonpregnant women who are considered low risk for pelvic cancer and who do not have symptoms. Ask your health care provider if a screening pelvic exam is right for you.  If you have had past treatment for cervical cancer or a condition that could lead to cancer, you need Pap tests and screening for cancer for at least 20 years after your treatment. If Pap tests have been discontinued, your risk factors (such as having a new sexual partner) need to be reassessed to determine if screening should resume. Some women have medical problems that increase the chance of getting cervical cancer. In these cases, your  health care provider may recommend more frequent screening and Pap tests.  Colorectal Cancer  This type of cancer can be detected and often prevented.  Routine colorectal cancer screening usually begins at 71 years of age and continues through 71 years of age.  Your health care provider may recommend screening at an earlier age if you have risk factors for colon cancer.  Your health care provider may also recommend using home test kits to check for hidden blood in the stool.  A small camera at the end of a tube can be used to examine your colon directly (sigmoidoscopy or colonoscopy). This is done to check for the earliest forms of colorectal cancer.  Routine screening usually begins at age 29.  Direct examination of the colon should be repeated every 5-10 years through 71 years of age. However, you may need to be screened more often if early forms of precancerous polyps or small growths are found.  Skin Cancer  Check your skin  from head to toe regularly.  Tell your health care provider about any new moles or changes in moles, especially if there is a change in a mole's shape or color.  Also tell your health care provider if you have a mole that is larger than the size of a pencil eraser.  Always use sunscreen. Apply sunscreen liberally and repeatedly throughout the day.  Protect yourself by wearing long sleeves, pants, a wide-brimmed hat, and sunglasses whenever you are outside.  Heart disease, diabetes, and high blood pressure  High blood pressure causes heart disease and increases the risk of stroke. High blood pressure is more likely to develop in: ? People who have blood pressure in the high end of the normal range (130-139/85-89 mm Hg). ? People who are overweight or obese. ? People who are African American.  If you are 87-58 years of age, have your blood pressure checked every 3-5 years. If you are 68 years of age or older, have your blood pressure checked every year. You  should have your blood pressure measured twice-once when you are at a hospital or clinic, and once when you are not at a hospital or clinic. Record the average of the two measurements. To check your blood pressure when you are not at a hospital or clinic, you can use: ? An automated blood pressure machine at a pharmacy. ? A home blood pressure monitor.  If you are between 30 years and 95 years old, ask your health care provider if you should take aspirin to prevent strokes.  Have regular diabetes screenings. This involves taking a blood sample to check your fasting blood sugar level. ? If you are at a normal weight and have a low risk for diabetes, have this test once every three years after 71 years of age. ? If you are overweight and have a high risk for diabetes, consider being tested at a younger age or more often. Preventing infection Hepatitis B  If you have a higher risk for hepatitis B, you should be screened for this virus. You are considered at high risk for hepatitis B if: ? You were born in a country where hepatitis B is common. Ask your health care provider which countries are considered high risk. ? Your parents were born in a high-risk country, and you have not been immunized against hepatitis B (hepatitis B vaccine). ? You have HIV or AIDS. ? You use needles to inject street drugs. ? You live with someone who has hepatitis B. ? You have had sex with someone who has hepatitis B. ? You get hemodialysis treatment. ? You take certain medicines for conditions, including cancer, organ transplantation, and autoimmune conditions.  Hepatitis C  Blood testing is recommended for: ? Everyone born from [birth year] through 1965. ? Anyone with known risk factors for hepatitis C.  Sexually transmitted infections (STIs)  You should be screened for sexually transmitted infections (STIs) including gonorrhea and chlamydia if: ? You are sexually active and are younger than 71 years of age. ? You  are older than 71 years of age and your health care provider tells you that you are at risk for this type of infection. ? Your sexual activity has changed since you were last screened and you are at an increased risk for chlamydia or gonorrhea. Ask your health care provider if you are at risk.  If you do not have HIV, but are at risk, it may be recommended that you take a prescription medicine daily to  prevent HIV infection. This is called pre-exposure prophylaxis (PrEP). You are considered at risk if: ? You are sexually active and do not regularly use condoms or know the HIV status of your partner(s). ? You take drugs by injection. ? You are sexually active with a partner who has HIV.  Talk with your health care provider about whether you are at high risk of being infected with HIV. If you choose to begin PrEP, you should first be tested for HIV. You should then be tested every 3 months for as long as you are taking PrEP. Pregnancy  If you are premenopausal and you may become pregnant, ask your health care provider about preconception counseling.  If you may become pregnant, take 400 to 800 micrograms (mcg) of folic acid every day.  If you want to prevent pregnancy, talk to your health care provider about birth control (contraception). Osteoporosis and menopause  Osteoporosis is a disease in which the bones lose minerals and strength with aging. This can result in serious bone fractures. Your risk for osteoporosis can be identified using a bone density scan.  If you are 23 years of age or older, or if you are at risk for osteoporosis and fractures, ask your health care provider if you should be screened.  Ask your health care provider whether you should take a calcium or vitamin D supplement to lower your risk for osteoporosis.  Menopause may have certain physical symptoms and risks.  Hormone replacement therapy may reduce some of these symptoms and risks. Talk to your health care  provider about whether hormone replacement therapy is right for you. Follow these instructions at home:  Schedule regular health, dental, and eye exams.  Stay current with your immunizations.  Do not use any tobacco products including cigarettes, chewing tobacco, or electronic cigarettes.  If you are pregnant, do not drink alcohol.  If you are breastfeeding, limit how much and how often you drink alcohol.  Limit alcohol intake to no more than 1 drink per day for nonpregnant women. One drink equals 12 ounces of beer, 5 ounces of wine, or 1 ounces of hard liquor.  Do not use street drugs.  Do not share needles.  Ask your health care provider for help if you need support or information about quitting drugs.  Tell your health care provider if you often feel depressed.  Tell your health care provider if you have ever been abused or do not feel safe at home. This information is not intended to replace advice given to you by your health care provider. Make sure you discuss any questions you have with your health care provider. Document Released: 03/28/2011 Document Revised: 02/18/2016 Document Reviewed: 06/16/2015 Elsevier Interactive Patient Education  [employer].

## 2017-06-28 NOTE — Assessment & Plan Note (Addendum)
Refilled tramadol to use PRN Reviewed possible interactions with SSRI and trazodone, encouraged sparing use.

## 2017-06-28 NOTE — Progress Notes (Signed)
BP 118/60 (BP Location: Left Arm, Patient Position: Sitting, Cuff Size: Normal)   Pulse 70   Temp 98.2 F (36.8 C) (Oral)   Ht 5' 0.5" (1.537 m)   Wt 158 lb 8 oz (71.9 kg)   BMI 30.45 kg/m    CC: CPE Subjective:    Patient ID: [name] [name], female    DOB: Dec 22, 1945, 71 y.o.   MRN: [id number]  HPI: [name] [name] is a 71 y.o. female presenting on 06/28/2017 for Annual Exam (Pt 2. Wants to discuss increasing Zoloft)   Here with daughter. Has life alert necklace on.  Not using ambulatory assistive device today.  Saw Lesia Monday for medicare wellness visit. Note reviewed. Minicog 16. Interested in higher zoloft dose. She has received full custody of 2 great grandchildren (18 and 62 yo). Increased stress level with this. Asks about higher zoloft dose. Regularly taking trazodone  nightly.   H/o multiple CVAs followed by Dr Corliss Skains IR, told has residual aneurysm. Compliant with plavix and aspirin. R residual hemiparesis. Known carotid stenosis. Has not seen IR recently.   Preventative: Colonoscopy 05/2016 for abnormal cologuard - decreased sphincter tone, diverticulosis, no f/u recommended (Danis).  Pap smear normal 11/2012 - strong fmhx cervical cancer. Had desired continued screening - due today 2017. Decided to age out last year.  Mammogram normal 11/2014 [facility]. Overdue for f/u. She does check breasts at home.  Lung cancer screen - 334 PY hx. Eligible for this. Interested.  DEXA Date: 11/2010 T -2.7 spine, -1.9 hip Osteoporosis. Compliant with cal/vit D and fosamax weekly (started 2013). Agrees to rpt DEXA.  Flu shot yearly Pneumovax - 11/2012. Prevnar 12/2013. Tdap 2016 shingrix - discussed Advanced directives discussed - working on this - needs to get notarized. Would want daughter Alexia Freestone to be HCPOA.  Seat belt use discussed Sunscreen use discussed. No suspicious moles.  Ex smoker quit 2014.  Alcohol - none  Lives with granddaughter and great  grandchildren Occupation: retired, was Lawyer Edu: 8th grade Activity: no regular activity: Diet: some water, fruits/vegetables daily  Relevant past medical, surgical, family and social history reviewed and updated as indicated. Interim medical history since our last visit reviewed. Allergies and medications reviewed and updated. Outpatient Medications Prior to Visit  Medication Sig Dispense Refill  . acetaminophen (TYLENOL) 500 MG tablet Take 500 mg by mouth 2 (two) times daily as needed for mild pain.     [name] albuterol (PROVENTIL HFA;VENTOLIN HFA) 108 (90 BASE) MCG/ACT inhaler Inhale 2 puffs into the lungs every 6 (six) hours as needed for wheezing or shortness of breath.     [name] alendronate (FOSAMAX) 70 MG tablet TAKE 1 TABLET WEEKLY  WITH A FULL GLASS OF WATER ON AN EMPTY STOMACH 12 tablet 3  . aspirin EC 81 MG tablet Take 81 mg by mouth daily.    [name] atorvastatin (LIPITOR) 40 MG tablet Take 1 tablet (40 mg total) by mouth daily. 90 tablet 0  . Calcium Carb-Cholecalciferol (CALCIUM-VITAMIN D) 600-400 MG-UNIT TABS Take 1 tablet by mouth daily.    . clopidogrel (PLAVIX) 75 MG tablet TAKE 1 TABLET EVERY DAY 90 tablet 0  . fexofenadine (ALLEGRA) 180 MG tablet Take 180 mg by mouth daily.    . benazepril (LOTENSIN) 10 MG tablet Take 1 tablet (10 mg total) by mouth daily. PLEASE SCHEDULE MEDICARE WELLNESS EXAM 90 tablet 0  . sertraline (ZOLOFT) 100 MG tablet Take 1 tablet (100 mg total) by mouth daily. MUST SCHEDULE OFFICE VISIT FOR MORE  REFILLS 90 tablet 0  . traMADol (ULTRAM) 50 MG tablet Take 1-2 tablets (50-100 mg total) by mouth every 6 (six) hours as needed (Pain). (Patient taking differently: Take 50 mg by mouth 3 (three) times daily as needed (Pain). ) 50 tablet 0  . traZODone (DESYREL) 100 MG tablet Take 1 tablet (100 mg total) by mouth at bedtime as needed. for sleep 90 tablet 3   Facility-Administered Medications Prior to Visit  Medication Dose Route Frequency Provider Last Rate Last Dose  .  0.9 %  sodium chloride infusion  500 mL Intravenous Continuous Danis, Starr Lake III, MD         Per HPI unless specifically indicated in ROS section below Review of Systems  Constitutional: Negative for activity change, appetite change, chills, fatigue, fever and unexpected weight change.  HENT: Negative for hearing loss.   Eyes: Negative for visual disturbance.  Respiratory: Positive for shortness of breath (with exertion). Negative for cough, chest tightness and wheezing.   Cardiovascular: Positive for leg swelling (occasional ankle swelling). Negative for chest pain and palpitations.  Gastrointestinal: Positive for constipation (occasional). Negative for abdominal distention, abdominal pain, blood in stool, diarrhea, nausea and vomiting.  Endocrine: Positive for cold intolerance.       Dry skin  Genitourinary: Negative for difficulty urinating and hematuria.  Musculoskeletal: Negative for arthralgias, myalgias and neck pain.  Skin: Negative for rash.  Neurological: Negative for dizziness, seizures, syncope and headaches.  Hematological: Negative for adenopathy. Does not bruise/bleed easily.  Psychiatric/Behavioral: Positive for dysphoric mood. The patient is not nervous/anxious.        Objective:    BP 118/60 (BP Location: Left Arm, Patient Position: Sitting, Cuff Size: Normal)   Pulse 70   Temp 98.2 F (36.8 C) (Oral)   Ht 5' 0.5" (1.537 m)   Wt 158 lb 8 oz (71.9 kg)   BMI 30.45 kg/m   Wt Readings from Last 3 Encounters:  06/28/17 158 lb 8 oz (71.9 kg)  06/26/17 159 lb (72.1 kg)  06/23/16 165 lb (74.8 kg)    Physical Exam  Constitutional: She is oriented to person, place, and time. She appears well-developed and well-nourished. No distress.  HENT:  Head: Normocephalic and atraumatic.  Right Ear: Hearing, tympanic membrane, external ear and ear canal normal.  Left Ear: Hearing, tympanic membrane, external ear and ear canal normal.  Nose: Nose normal.  Mouth/Throat: Uvula  is midline, oropharynx is clear and moist and mucous membranes are normal. No oropharyngeal exudate, posterior oropharyngeal edema or posterior oropharyngeal erythema.  Eyes: Pupils are equal, round, and reactive to light. Conjunctivae and EOM are normal. No scleral icterus.  Neck: Normal range of motion. Neck supple. Carotid bruit is present (faint). No thyromegaly present.  Cardiovascular: Normal rate, regular rhythm, normal heart sounds and intact distal pulses.   No murmur heard. Pulses:      Radial pulses are 2+ on the right side, and 2+ on the left side.  Pulmonary/Chest: Effort normal and breath sounds normal. No respiratory distress. She has no wheezes. She has no rales.  Abdominal: Soft. Bowel sounds are normal. She exhibits no distension and no mass. There is no tenderness. There is no rebound and no guarding.  Musculoskeletal: Normal range of motion. She exhibits no edema.  Thoracic kyphosis  Lymphadenopathy:    She has no cervical adenopathy.  Neurological: She is alert and oriented to person, place, and time.  CN grossly intact, station and gait intact  Skin: Skin is warm  and dry. No rash noted.  Psychiatric: She has a normal mood and affect. Her behavior is normal. Judgment and thought content normal.  Nursing note and vitals reviewed.  Results for orders placed or performed in visit on 06/27/17  T4, free  Result Value Ref Range   Free T4 0.87 0.60 - 1.60 ng/dL      Assessment & Plan:   Problem List Items Addressed This Visit    Advanced care planning/counseling discussion    Advanced directives discussed - working on this - needs to get notarized. Would want daughter Alexia Freestone to be HCPOA.       Carotid artery aneurysm (HCC)    rec schedule f/u with IR.       Relevant Medications   benazepril (LOTENSIN) 10 MG tablet   Carotid stenosis   Relevant Medications   benazepril (LOTENSIN) 10 MG tablet   COPD (chronic obstructive pulmonary disease) (HCC)    Denies sxs. On  PRN albuterol. Did not start foradil.       Ex-smoker    Interested in lung cancer screening - will refer.       Relevant Orders   Ambulatory Referral for Lung Cancer Scre   Health maintenance examination - Primary    Preventative protocols reviewed and updated unless pt declined. Discussed healthy diet and lifestyle.       Hemiparesis affecting right side as late effect of stroke (HCC)    Ongoing.       History of stroke    Followed by IR. Encouraged she schedule f/u as due.  Continue aspirin, plavix, statin.       Hyperlipidemia    Chronic, deteriorated with LDL >100. Continues atorvastatin  daily. Reviewed diet changes to improve LDL cholesterol levels. Discussed if remaining elevated next FLP, will increase lipitor dosing       Relevant Medications   benazepril (LOTENSIN) 10 MG tablet   Hypertension    Chronic, stable. Continue current regimen.       Relevant Medications   benazepril (LOTENSIN) 10 MG tablet   Hypothyroidism (acquired)    TSH elevated, free T4 normal however endorsing hypothyroid symptoms.  Will trial low dose levothyroxine daily.  RTC 2 mo f/u OV and labs.       Relevant Medications   levothyroxine (SYNTHROID, LEVOTHROID) 25 MCG tablet   Insomnia    Continue trazodone  nightly.       MDD (major depressive disorder), recurrent episode, moderate (HCC)    Chronic. Compliant with sertraline and trazodone. Continue. Increased stress at home caring for great grandchildren (full custody)  She asks about increasing zoloft dose. Found to have hypothyroidism - will first treat this and monitor effect on depression. Pt agrees with plan.       Relevant Medications   traZODone (DESYREL) 100 MG tablet   sertraline (ZOLOFT) 100 MG tablet   Osteoarthritis    Refilled tramadol to use PRN Reviewed possible interactions with SSRI and trazodone, encouraged sparing use.       Relevant Medications   traMADol (ULTRAM) 50 MG tablet    Osteoporosis    Update DEXA. She has completed 5 yrs bisphosphonate treatment.  Will see if we can get her set up for prolia injections.       Relevant Orders   DG Bone Density   Peripheral vascular disease (HCC)    Known carotid disease.       Relevant Medications   benazepril (LOTENSIN) 10 MG tablet  Follow up plan: Return in about 3 months (around 09/28/2017) for follow up visit.  [name], MD

## 2017-06-28 NOTE — Assessment & Plan Note (Addendum)
Denies sxs. On PRN albuterol. Did not start foradil.

## 2017-06-28 NOTE — Assessment & Plan Note (Signed)
Chronic, deteriorated with LDL >100. Continues atorvastatin  daily. Reviewed diet changes to improve LDL cholesterol levels. Discussed if remaining elevated next FLP, will increase lipitor dosing

## 2017-06-28 NOTE — Telephone Encounter (Signed)
Can we set patient up for prolia injections for osteoporosis? She has completed 5 years of oral bisphosphonate treatment (fosamax). Thanks. Pending update DEXA.

## 2017-06-28 NOTE — Assessment & Plan Note (Signed)
Chronic, stable. Continue current regimen. 

## 2017-06-28 NOTE — Assessment & Plan Note (Signed)
Continue trazodone 150 mg nightly

## 2017-06-28 NOTE — Assessment & Plan Note (Signed)
Chronic. Compliant with sertraline and trazodone. Continue. Increased stress at home caring for great grandchildren (full custody)  She asks about increasing zoloft dose. Found to have hypothyroidism - will first treat this and monitor effect on depression. Pt agrees with plan.

## 2017-06-28 NOTE — Assessment & Plan Note (Signed)
Followed by IR. Encouraged she schedule f/u as due.  Continue aspirin, plavix, statin.

## 2017-06-28 NOTE — Assessment & Plan Note (Signed)
Advanced directives discussed - working on this - needs to get notarized. Would want daughter Alexia Freestone to be HCPOA.

## 2017-06-28 NOTE — Assessment & Plan Note (Signed)
Known carotid disease.

## 2017-06-28 NOTE — Assessment & Plan Note (Signed)
TSH elevated, free T4 normal however endorsing hypothyroid symptoms.  Will trial low dose levothyroxine daily.  RTC 2 mo f/u OV and labs.

## 2017-06-28 NOTE — Assessment & Plan Note (Signed)
Preventative protocols reviewed and updated unless pt declined. Discussed healthy diet and lifestyle.  

## 2017-06-28 NOTE — Assessment & Plan Note (Signed)
Update DEXA. She has completed 5 yrs bisphosphonate treatment.  Will see if we can get her set up for prolia injections.

## 2017-06-28 NOTE — Assessment & Plan Note (Signed)
rec schedule f/u with IR.

## 2017-06-30 ENCOUNTER — Other Ambulatory Visit: Payer: Self-pay | Admitting: Acute Care

## 2017-06-30 DIAGNOSIS — Z87891 Personal history of nicotine dependence: Secondary | ICD-10-CM

## 2017-06-30 DIAGNOSIS — Z122 Encounter for screening for malignant neoplasm of respiratory organs: Secondary | ICD-10-CM

## 2017-07-03 ENCOUNTER — Encounter: Payer: Commercial Managed Care - HMO | Admitting: Family Medicine

## 2017-07-05 NOTE — Telephone Encounter (Signed)
Information has been submitted to pts insurance for verification of benefits. Awaiting response for coverage  

## 2017-07-19 ENCOUNTER — Encounter: Payer: Self-pay | Admitting: Acute Care

## 2017-07-19 ENCOUNTER — Ambulatory Visit (INDEPENDENT_AMBULATORY_CARE_PROVIDER_SITE_OTHER)
Admission: RE | Admit: 2017-07-19 | Discharge: 2017-07-19 | Disposition: A | Discharge status: Home / Self Care | Payer: Medicare HMO | Source: Ambulatory Visit | Attending: Acute Care | Admitting: Acute Care

## 2017-07-19 ENCOUNTER — Ambulatory Visit (INDEPENDENT_AMBULATORY_CARE_PROVIDER_SITE_OTHER): Payer: Medicare HMO | Admitting: Acute Care

## 2017-07-19 DIAGNOSIS — Z87891 Personal history of nicotine dependence: Secondary | ICD-10-CM

## 2017-07-19 DIAGNOSIS — Z122 Encounter for screening for malignant neoplasm of respiratory organs: Secondary | ICD-10-CM

## 2017-07-19 NOTE — Progress Notes (Signed)
Shared Decision Making Visit Lung Cancer Screening Program [phone]([id number])   Eligibility:  Age 71 y.o.  Pack Years Smoking History Calculation 46-pack-year smoking history (# packs/per year x # years smoked)  Recent History of coughing up blood  no  Unexplained weight loss? no ( >Than 15 pounds within the last 6 months )  Prior History Lung / other cancer no (Diagnosis within the last 5 years already requiring surveillance chest CT Scans).  Smoking Status Former Smoker  Former Smokers: Years since quit: 4 years  Quit Date:11/08/2012   Visit Components:  Discussion included one or more decision making aids. yes  Discussion included risk/benefits of screening. yes  Discussion included potential follow up diagnostic testing for abnormal scans. yes  Discussion included meaning and risk of over diagnosis. yes  Discussion included meaning and risk of False Positives. yes  Discussion included meaning of total radiation exposure. yes  Counseling Included:  Importance of adherence to annual lung cancer LDCT screening. yes  Impact of comorbidities on ability to participate in the program. yes  Ability and willingness to under diagnostic treatment. yes  Smoking Cessation Counseling:  Current Smokers:   Discussed importance of smoking cessation. Not applicable, former smoker  Information about tobacco cessation classes and interventions provided to patient. yes  Patient provided with "ticket" for LDCT Scan. yes  Symptomatic Patient. no  Counseling  Diagnosis Code: Tobacco Use Z72.0  Asymptomatic Patient yes  Counseling (Intermediate counseling: > three minutes counseling) [id number]  Former Smokers:   Discussed the importance of maintaining cigarette abstinence. yes  Diagnosis Code: Personal History of Nicotine Dependence. U98.119Z87.891  Information about tobacco cessation classes and interventions provided to patient. Yes  Patient provided with "ticket" for LDCT Scan.  yes  Written Order for Lung Cancer Screening with LDCT placed in Epic. Yes (CT Chest Lung Cancer Screening Low Dose W/O CM) [id number] Z12.2-Screening of respiratory organs Z87.891-Personal history of nicotine dependence  I spent 25 minutes of face to face time with [name] [name] and her granddaughter discussing the risks and benefits of lung cancer screening. We viewed a power point together that explained in detail the above noted topics. We took the time to pause the power point at intervals to allow for questions to be asked and answered to ensure understanding. We discussed that she had taken the single most powerful action possible to decrease her risk of developing lung cancer when she quit smoking 11/08/2012. I counseled her to remain smoke free, and to contact me if she ever had the desire to smoke again so that I can provide resources and tools to help support the effort to remain smoke free. We discussed the time and location of the scan, and that either  Abigail Miyamotoenise Phelps RN or I will call with the results within  24-48 hours of receiving them. [name] [name] has my card and contact information in the event she needs to speak with me, in addition to a copy of the power point we reviewed as a resource. She verbalized understanding of all of the above and had no further questions upon leaving the office.     I explained to the patient that there has been a high incidence of coronary artery disease noted on these exams. I explained that this is a non-gated exam therefore degree or severity cannot be determined. This patient is currently on statin therapy. I have asked the patient to follow-up with their PCP regarding any incidental finding of coronary artery disease and management with diet or  medication as they feel is clinically indicated. The patient verbalized understanding of the above and had no further questions.     Bevelyn Ngo, NP 07/19/2017

## 2017-07-21 DIAGNOSIS — M810 Age-related osteoporosis without current pathological fracture: Secondary | ICD-10-CM | POA: Diagnosis not present

## 2017-07-21 DIAGNOSIS — M8589 Other specified disorders of bone density and structure, multiple sites: Secondary | ICD-10-CM | POA: Diagnosis not present

## 2017-07-21 DIAGNOSIS — Z1231 Encounter for screening mammogram for malignant neoplasm of breast: Secondary | ICD-10-CM | POA: Diagnosis not present

## 2017-07-21 LAB — HM MAMMOGRAPHY

## 2017-07-22 NOTE — Telephone Encounter (Signed)
[facility].

## 2017-07-26 NOTE — Telephone Encounter (Signed)
PA form completed and awaiting response

## 2017-07-28 ENCOUNTER — Encounter: Payer: Self-pay | Admitting: Family Medicine

## 2017-08-01 ENCOUNTER — Other Ambulatory Visit: Payer: Self-pay | Admitting: Acute Care

## 2017-08-01 DIAGNOSIS — Z122 Encounter for screening for malignant neoplasm of respiratory organs: Secondary | ICD-10-CM

## 2017-08-01 DIAGNOSIS — Z87891 Personal history of nicotine dependence: Secondary | ICD-10-CM

## 2017-08-08 NOTE — Telephone Encounter (Signed)
Verification of benefits have been processed and an approval has been received for pts prolia injection. Pts estimated cost are appx $220. This is only an estimate and cannot be confirmed until benefits are paid. Please advise pt and schedule if needed. If scheduled, once the injection is received, pls contact me back with the date it was received so that I am able to update prolia folder. thanks  

## 2017-08-08 NOTE — Telephone Encounter (Signed)
Spoke to pt and advised. States she is unable to afford $220 and is wanting to know if there is an alternate for a more effective cost.

## 2017-08-08 NOTE — Telephone Encounter (Signed)
PA completed and per Sherilyn CooterHenry, as of Sep 19,2018 a PA is no longer required for medicare. Ref# [id number]

## 2017-08-11 NOTE — Telephone Encounter (Signed)
Unfortunately no good alternatives.  Just encourage regular weight bearing exercises and compliance with calcium and vitamin D.

## 2017-08-11 NOTE — Telephone Encounter (Signed)
Spoke with pt relaying message per Dr. G.  Says ok. 

## 2017-08-23 ENCOUNTER — Other Ambulatory Visit: Payer: Self-pay | Admitting: Family Medicine

## 2017-09-11 ENCOUNTER — Other Ambulatory Visit: Payer: Self-pay | Admitting: Family Medicine

## 2017-09-11 ENCOUNTER — Other Ambulatory Visit (INDEPENDENT_AMBULATORY_CARE_PROVIDER_SITE_OTHER): Payer: Medicare HMO

## 2017-09-11 DIAGNOSIS — E559 Vitamin D deficiency, unspecified: Secondary | ICD-10-CM

## 2017-09-11 DIAGNOSIS — E785 Hyperlipidemia, unspecified: Secondary | ICD-10-CM | POA: Diagnosis not present

## 2017-09-11 DIAGNOSIS — E039 Hypothyroidism, unspecified: Secondary | ICD-10-CM

## 2017-09-11 LAB — T4, FREE: Free T4: 0.72 ng/dL (ref 0.60–1.60)

## 2017-09-11 LAB — VITAMIN D 25 HYDROXY (VIT D DEFICIENCY, FRACTURES): VITD: 26.9 ng/mL — ABNORMAL LOW (ref 30.00–100.00)

## 2017-09-11 LAB — TSH: TSH: 3.91 u[IU]/mL (ref 0.35–4.50)

## 2017-09-11 LAB — LDL CHOLESTEROL, DIRECT: Direct LDL: 84 mg/dL

## 2017-09-12 ENCOUNTER — Other Ambulatory Visit: Payer: Medicare HMO

## 2017-09-14 ENCOUNTER — Ambulatory Visit: Payer: Medicare HMO | Admitting: Family Medicine

## 2017-09-22 ENCOUNTER — Encounter: Payer: Self-pay | Admitting: Nurse Practitioner

## 2017-09-22 ENCOUNTER — Ambulatory Visit (INDEPENDENT_AMBULATORY_CARE_PROVIDER_SITE_OTHER): Payer: Medicare HMO | Admitting: Nurse Practitioner

## 2017-09-22 ENCOUNTER — Ambulatory Visit (INDEPENDENT_AMBULATORY_CARE_PROVIDER_SITE_OTHER): Payer: Medicare HMO

## 2017-09-22 VITALS — BP 122/70 | HR 80 | Temp 98.2°F | Ht 60.5 in | Wt 168.0 lb

## 2017-09-22 DIAGNOSIS — M5441 Lumbago with sciatica, right side: Secondary | ICD-10-CM | POA: Diagnosis not present

## 2017-09-22 DIAGNOSIS — W19XXXA Unspecified fall, initial encounter: Secondary | ICD-10-CM

## 2017-09-22 DIAGNOSIS — S79911A Unspecified injury of right hip, initial encounter: Secondary | ICD-10-CM | POA: Diagnosis not present

## 2017-09-22 DIAGNOSIS — M25551 Pain in right hip: Secondary | ICD-10-CM | POA: Diagnosis not present

## 2017-09-22 DIAGNOSIS — G8929 Other chronic pain: Secondary | ICD-10-CM

## 2017-09-22 NOTE — Patient Instructions (Addendum)
No fracture  Advised to use tramadol as prescribed. May alternate with tylenol 500mg  as directed on package. Use warm compress as needed. Go to ED if pain worsens

## 2017-09-22 NOTE — Progress Notes (Signed)
Subjective:  Patient ID: [name] [name], female    DOB: 03-05-1946  Age: 71 y.o. MRN: [id number]  CC: Back Pain (back pain going down toward legs, hard time moving her legs/ sled down her bed last night/ hx of fracture before due to osteroprorosis. fall alot)   Back Pain  This is a recurrent problem. The problem occurs constantly. The problem has been gradually worsening since onset. The pain is present in the lumbar spine. The quality of the pain is described as aching and cramping. The pain radiates to the right thigh. The pain is severe. The symptoms are aggravated by lying down, twisting and bending. Associated symptoms include pelvic pain. Pertinent negatives include no bladder incontinence, bowel incontinence, dysuria, numbness, paresthesias, perianal numbness or weakness. Risk factors include recent trauma, poor posture, lack of exercise, menopause, obesity, history of osteoporosis and sedentary lifestyle. She has tried analgesics for the symptoms. The treatment provided mild relief.  worsening back pain after fall at home. Fall on buttocks while trying to get out of bed.  Outpatient Medications Prior to Visit  Medication Sig Dispense Refill  . acetaminophen (TYLENOL) 500 MG tablet Take 500 mg by mouth 2 (two) times daily as needed for mild pain.     [name] albuterol (PROVENTIL HFA;VENTOLIN HFA) 108 (90 BASE) MCG/ACT inhaler Inhale 2 puffs into the lungs every 6 (six) hours as needed for wheezing or shortness of breath.     [name] alendronate (FOSAMAX) 70 MG tablet TAKE 1 TABLET WEEKLY  WITH A FULL GLASS OF WATER ON AN EMPTY STOMACH 12 tablet 3  . aspirin EC 81 MG tablet Take 81 mg by mouth daily.    [name] atorvastatin (LIPITOR) 40 MG tablet TAKE 1 TABLET (40 MG TOTAL) BY MOUTH DAILY. 90 tablet 0  . benazepril (LOTENSIN) 10 MG tablet Take 1 tablet (10 mg total) by mouth daily. 90 tablet 3  . Calcium Carb-Cholecalciferol (CALCIUM-VITAMIN D) 600-400 MG-UNIT TABS Take 1 tablet by mouth daily.    .  clopidogrel (PLAVIX) 75 MG tablet TAKE 1 TABLET EVERY DAY 90 tablet 0  . fexofenadine (ALLEGRA) 180 MG tablet Take 180 mg by mouth daily.    [name] levothyroxine (SYNTHROID, LEVOTHROID) 25 MCG tablet Take 1 tablet (25 mcg total) by mouth daily before breakfast. 30 tablet 3  . sertraline (ZOLOFT) 100 MG tablet Take 1 tablet (100 mg total) by mouth daily. 90 tablet 3  . traMADol (ULTRAM) 50 MG tablet Take 1 tablet (50 mg total) by mouth 2 (two) times daily as needed (Pain). 50 tablet 0  . traZODone (DESYREL) 100 MG tablet Take 1.5 tablets (150 mg total) by mouth at bedtime. for sleep 135 tablet 3   Facility-Administered Medications Prior to Visit  Medication Dose Route Frequency Provider Last Rate Last Dose  . 0.9 %  sodium chloride infusion  500 mL Intravenous Continuous Charlie Pitter III, MD        ROS See HPI  Objective:  BP 122/70   Pulse 80   Temp 98.2 F (36.8 C)   Ht 5' 0.5" (1.537 m)   Wt 168 lb (76.2 kg)   SpO2 97%   BMI 32.27 kg/m   BP Readings from Last 3 Encounters:  09/22/17 122/70  06/28/17 118/60  06/26/17 124/70    Wt Readings from Last 3 Encounters:  09/22/17 168 lb (76.2 kg)  06/28/17 158 lb 8 oz (71.9 kg)  06/26/17 159 lb (72.1 kg)    Physical Exam  Musculoskeletal:  Right hip: She exhibits tenderness. She exhibits normal range of motion and no bony tenderness.       Left hip: She exhibits tenderness. She exhibits normal range of motion and no bony tenderness.       Lumbar back: She exhibits tenderness and pain. She exhibits normal range of motion, no bony tenderness and no swelling.  Lumbar paraspinal muscle tenderness. No coccyx tenderness. Ambulates with cane    Lab Results  Component Value Date   WBC 7.3 11/10/2015   HGB 13.7 11/10/2015   HCT 42.7 11/10/2015   PLT 160 11/10/2015   GLUCOSE 90 06/26/2017   CHOL 188 06/26/2017   TRIG 78.0 06/26/2017   HDL 65.80 06/26/2017   LDLDIRECT 84.0 09/11/2017   LDLCALC 107 (H) 06/26/2017   ALT 16  11/10/2015   AST 20 11/10/2015   NA 139 06/26/2017   K 4.5 06/26/2017   CL 102 06/26/2017   CREATININE 0.84 06/26/2017   BUN 21 06/26/2017   CO2 30 06/26/2017   TSH 3.91 09/11/2017   INR 1.06 11/10/2015   HGBA1C  09/30/2008    5.6 (NOTE)   The ADA recommends the following therapeutic goal for glycemic   control related to Hgb A1C measurement:   Goal of Therapy:   < 7.0% Hgb A1C   Reference: American Diabetes Association: Clinical Practice   Recommendations 2008, Diabetes Care,  2008, 31:(Suppl 1).   MICROALBUR 1.8 01/25/2016    Ct Chest Lung Ca Screen Low Dose W/o Cm  Result Date: 07/19/2017 CLINICAL DATA:  Lung cancer screening. Former smoker. Asymptomatic. Forty-six pack-year history. EXAM: CT CHEST WITHOUT CONTRAST LOW-DOSE FOR LUNG CANCER SCREENING TECHNIQUE: Multidetector CT imaging of the chest was performed following the standard protocol without IV contrast. COMPARISON:  None FINDINGS: Cardiovascular: The heart size is normal. There is aortic atherosclerosis. Calcifications within the left main coronary artery, LAD, left circumflex and RCA noted. No pericardial effusion. Mediastinum/Nodes: No enlarged mediastinal, hilar, or axillary lymph nodes. Thyroid gland, trachea, and esophagus demonstrate no significant findings. Lungs/Pleura: No pleural effusion. Moderate changes of centrilobular emphysema. No pulmonary nodules identified. Upper Abdomen: Previous cholecystectomy. Calcifications within the upper pole of right kidney noted. No acute abnormality visualized within the upper abdomen. Musculoskeletal: No chest wall mass or suspicious bone lesions identified. Chronic superior endplate compression deformity involving the T11 vertebra is again noted. Unchanged from previous exam. IMPRESSION: 1. Lung-RADS 1, negative. Continue annual screening with low-dose chest CT without contrast in 12 months. 2. Aortic Atherosclerosis (ICD10-I70.0) and Emphysema (ICD10-J43.9). 3. Three vessel coronary  artery calcifications and left main coronary artery calcification. Electronically Signed   By: Signa Kell M.D.   On: 07/19/2017 13:53    Assessment & Plan:   Alanee was seen today for back pain.  Diagnoses and all orders for this visit:  Fall, initial encounter -     DG HIP UNILAT WITH PELVIS 2-3 VIEWS RIGHT  Hip pain, acute, right -     DG HIP UNILAT WITH PELVIS 2-3 VIEWS RIGHT  Chronic midline low back pain with right-sided sciatica -     DG HIP UNILAT WITH PELVIS 2-3 VIEWS RIGHT   I am having Zannah S. Weinberg maintain her aspirin EC, albuterol, Calcium-Vitamin D, fexofenadine, acetaminophen, alendronate, benazepril, traZODone, traMADol, sertraline, levothyroxine, atorvastatin, and clopidogrel. We will continue to administer sodium chloride.  No orders of the defined types were placed in this encounter.   Follow-up: Return if symptoms worsen or fail to improve.  Alysia Penna, NP

## 2017-09-25 ENCOUNTER — Encounter: Payer: Self-pay | Admitting: Nurse Practitioner

## 2017-09-25 DIAGNOSIS — M25551 Pain in right hip: Secondary | ICD-10-CM | POA: Diagnosis not present

## 2017-09-25 DIAGNOSIS — M4808 Spinal stenosis, sacral and sacrococcygeal region: Secondary | ICD-10-CM | POA: Diagnosis not present

## 2017-09-25 DIAGNOSIS — M545 Low back pain: Secondary | ICD-10-CM | POA: Diagnosis not present

## 2017-09-29 ENCOUNTER — Other Ambulatory Visit: Payer: Self-pay | Admitting: Family Medicine

## 2017-09-29 NOTE — Telephone Encounter (Signed)
Last filled:  07/01/17, #50 Last OV:  09/22/17 Next OV:  07/02/18

## 2017-09-29 NOTE — Telephone Encounter (Signed)
Sent electronically 

## 2017-09-30 ENCOUNTER — Observation Stay (HOSPITAL_COMMUNITY)
Admission: EM | Admit: 2017-09-30 | Discharge: 2017-10-04 | Disposition: A | Discharge status: Home / Self Care | Payer: Medicare HMO | Attending: Family Medicine | Admitting: Family Medicine

## 2017-09-30 ENCOUNTER — Other Ambulatory Visit: Payer: Self-pay

## 2017-09-30 ENCOUNTER — Emergency Department (HOSPITAL_COMMUNITY): Payer: Medicare HMO

## 2017-09-30 ENCOUNTER — Encounter (HOSPITAL_COMMUNITY): Payer: Self-pay | Admitting: *Deleted

## 2017-09-30 DIAGNOSIS — R262 Difficulty in walking, not elsewhere classified: Secondary | ICD-10-CM | POA: Insufficient documentation

## 2017-09-30 DIAGNOSIS — Z79890 Hormone replacement therapy (postmenopausal): Secondary | ICD-10-CM | POA: Insufficient documentation

## 2017-09-30 DIAGNOSIS — M48061 Spinal stenosis, lumbar region without neurogenic claudication: Secondary | ICD-10-CM | POA: Insufficient documentation

## 2017-09-30 DIAGNOSIS — Z8673 Personal history of transient ischemic attack (TIA), and cerebral infarction without residual deficits: Secondary | ICD-10-CM | POA: Insufficient documentation

## 2017-09-30 DIAGNOSIS — Z7902 Long term (current) use of antithrombotics/antiplatelets: Secondary | ICD-10-CM | POA: Diagnosis not present

## 2017-09-30 DIAGNOSIS — M5125 Other intervertebral disc displacement, thoracolumbar region: Secondary | ICD-10-CM | POA: Diagnosis not present

## 2017-09-30 DIAGNOSIS — Z419 Encounter for procedure for purposes other than remedying health state, unspecified: Secondary | ICD-10-CM

## 2017-09-30 DIAGNOSIS — I10 Essential (primary) hypertension: Secondary | ICD-10-CM | POA: Insufficient documentation

## 2017-09-30 DIAGNOSIS — R1310 Dysphagia, unspecified: Secondary | ICD-10-CM | POA: Diagnosis not present

## 2017-09-30 DIAGNOSIS — M5124 Other intervertebral disc displacement, thoracic region: Secondary | ICD-10-CM | POA: Insufficient documentation

## 2017-09-30 DIAGNOSIS — M5126 Other intervertebral disc displacement, lumbar region: Secondary | ICD-10-CM | POA: Insufficient documentation

## 2017-09-30 DIAGNOSIS — Z87891 Personal history of nicotine dependence: Secondary | ICD-10-CM | POA: Diagnosis not present

## 2017-09-30 DIAGNOSIS — J449 Chronic obstructive pulmonary disease, unspecified: Secondary | ICD-10-CM | POA: Diagnosis not present

## 2017-09-30 DIAGNOSIS — S22000A Wedge compression fracture of unspecified thoracic vertebra, initial encounter for closed fracture: Secondary | ICD-10-CM | POA: Diagnosis present

## 2017-09-30 DIAGNOSIS — G8191 Hemiplegia, unspecified affecting right dominant side: Secondary | ICD-10-CM | POA: Insufficient documentation

## 2017-09-30 DIAGNOSIS — I739 Peripheral vascular disease, unspecified: Secondary | ICD-10-CM | POA: Diagnosis not present

## 2017-09-30 DIAGNOSIS — Z8249 Family history of ischemic heart disease and other diseases of the circulatory system: Secondary | ICD-10-CM | POA: Insufficient documentation

## 2017-09-30 DIAGNOSIS — M545 Low back pain: Secondary | ICD-10-CM | POA: Diagnosis not present

## 2017-09-30 DIAGNOSIS — E559 Vitamin D deficiency, unspecified: Secondary | ICD-10-CM | POA: Insufficient documentation

## 2017-09-30 DIAGNOSIS — W06XXXA Fall from bed, initial encounter: Secondary | ICD-10-CM | POA: Diagnosis not present

## 2017-09-30 DIAGNOSIS — M5136 Other intervertebral disc degeneration, lumbar region: Secondary | ICD-10-CM | POA: Diagnosis not present

## 2017-09-30 DIAGNOSIS — E039 Hypothyroidism, unspecified: Secondary | ICD-10-CM | POA: Insufficient documentation

## 2017-09-30 DIAGNOSIS — S32049A Unspecified fracture of fourth lumbar vertebra, initial encounter for closed fracture: Secondary | ICD-10-CM | POA: Diagnosis not present

## 2017-09-30 DIAGNOSIS — Z8049 Family history of malignant neoplasm of other genital organs: Secondary | ICD-10-CM | POA: Insufficient documentation

## 2017-09-30 DIAGNOSIS — F419 Anxiety disorder, unspecified: Secondary | ICD-10-CM | POA: Diagnosis not present

## 2017-09-30 DIAGNOSIS — M1990 Unspecified osteoarthritis, unspecified site: Secondary | ICD-10-CM | POA: Diagnosis not present

## 2017-09-30 DIAGNOSIS — Z7982 Long term (current) use of aspirin: Secondary | ICD-10-CM | POA: Diagnosis not present

## 2017-09-30 DIAGNOSIS — Z79899 Other long term (current) drug therapy: Secondary | ICD-10-CM | POA: Diagnosis not present

## 2017-09-30 DIAGNOSIS — M810 Age-related osteoporosis without current pathological fracture: Secondary | ICD-10-CM | POA: Insufficient documentation

## 2017-09-30 DIAGNOSIS — R531 Weakness: Secondary | ICD-10-CM | POA: Diagnosis not present

## 2017-09-30 DIAGNOSIS — S3992XA Unspecified injury of lower back, initial encounter: Secondary | ICD-10-CM | POA: Diagnosis not present

## 2017-09-30 DIAGNOSIS — S32040A Wedge compression fracture of fourth lumbar vertebra, initial encounter for closed fracture: Principal | ICD-10-CM | POA: Insufficient documentation

## 2017-09-30 DIAGNOSIS — E785 Hyperlipidemia, unspecified: Secondary | ICD-10-CM | POA: Diagnosis not present

## 2017-09-30 DIAGNOSIS — Z881 Allergy status to other antibiotic agents status: Secondary | ICD-10-CM | POA: Insufficient documentation

## 2017-09-30 DIAGNOSIS — Z8241 Family history of sudden cardiac death: Secondary | ICD-10-CM | POA: Insufficient documentation

## 2017-09-30 DIAGNOSIS — F329 Major depressive disorder, single episode, unspecified: Secondary | ICD-10-CM | POA: Diagnosis not present

## 2017-09-30 DIAGNOSIS — Z8379 Family history of other diseases of the digestive system: Secondary | ICD-10-CM | POA: Insufficient documentation

## 2017-09-30 LAB — URINALYSIS, ROUTINE W REFLEX MICROSCOPIC
Bilirubin Urine: NEGATIVE
Glucose, UA: NEGATIVE mg/dL
Hgb urine dipstick: NEGATIVE
Ketones, ur: NEGATIVE mg/dL
Nitrite: NEGATIVE
Protein, ur: NEGATIVE mg/dL
Specific Gravity, Urine: 1.012 (ref 1.005–1.030)
pH: 6 (ref 5.0–8.0)

## 2017-09-30 LAB — CBC
HCT: 39.8 % (ref 36.0–46.0)
HCT: 39.5 % (ref 36.0–46.0)
Hemoglobin: 12.6 g/dL (ref 12.0–15.0)
Hemoglobin: 12.4 g/dL (ref 12.0–15.0)
MCH: 29.4 pg (ref 26.0–34.0)
MCH: 29.1 pg (ref 26.0–34.0)
MCHC: 31.7 g/dL (ref 30.0–36.0)
MCHC: 31.4 g/dL (ref 30.0–36.0)
MCV: 92.8 fL (ref 78.0–100.0)
MCV: 92.7 fL (ref 78.0–100.0)
Platelets: 161 10*3/uL (ref 150–400)
Platelets: 151 10*3/uL (ref 150–400)
RBC: 4.29 MIL/uL (ref 3.87–5.11)
RBC: 4.26 MIL/uL (ref 3.87–5.11)
RDW: 12.9 % (ref 11.5–15.5)
RDW: 12.8 % (ref 11.5–15.5)
WBC: 5.8 10*3/uL (ref 4.0–10.5)
WBC: 4.9 10*3/uL (ref 4.0–10.5)

## 2017-09-30 LAB — CREATININE, SERUM
Creatinine, Ser: 0.94 mg/dL (ref 0.44–1.00)
GFR calc Af Amer: >60 mL/min (ref >60)
GFR calc non Af Amer: 60 mL/min — ABNORMAL LOW (ref >60)

## 2017-09-30 LAB — BASIC METABOLIC PANEL
Anion gap: 11 (ref 5–15)
BUN: 14 mg/dL (ref 6–20)
CO2: 27 mmol/L (ref 22–32)
Calcium: 9.3 mg/dL (ref 8.9–10.3)
Chloride: 101 mmol/L (ref 101–111)
Creatinine, Ser: 0.99 mg/dL (ref 0.44–1.00)
GFR calc Af Amer: >60 mL/min (ref >60)
GFR calc non Af Amer: 56 mL/min — ABNORMAL LOW (ref >60)
Glucose, Bld: 91 mg/dL (ref 65–99)
Potassium: 4.5 mmol/L (ref 3.5–5.1)
Sodium: 139 mmol/L (ref 135–145)

## 2017-09-30 MED ORDER — MORPHINE SULFATE (PF) 4 MG/ML IV SOLN
4.0000 mg | Freq: Once | INTRAVENOUS | Status: AC
  Administered 2017-09-30: 4 mg via INTRAVENOUS
  Filled 2017-09-30: qty 1

## 2017-09-30 MED ORDER — ATORVASTATIN CALCIUM 40 MG PO TABS
40.0000 mg | ORAL_TABLET | Freq: Every day | ORAL | Status: DC
  Administered 2017-10-01 – 2017-10-04 (×4): 40 mg via ORAL
  Filled 2017-09-30 (×4): qty 1

## 2017-09-30 MED ORDER — ALBUTEROL SULFATE (2.5 MG/3ML) 0.083% IN NEBU
3.0000 mL | INHALATION_SOLUTION | Freq: Four times a day (QID) | RESPIRATORY_TRACT | Status: DC | PRN
Start: 2017-09-30 — End: 2017-10-04

## 2017-09-30 MED ORDER — BENAZEPRIL HCL 5 MG PO TABS
10.0000 mg | ORAL_TABLET | Freq: Every day | ORAL | Status: DC
  Administered 2017-10-01 – 2017-10-04 (×4): 10 mg via ORAL
  Filled 2017-09-30 (×4): qty 2

## 2017-09-30 MED ORDER — SERTRALINE HCL 100 MG PO TABS
100.0000 mg | ORAL_TABLET | Freq: Every day | ORAL | Status: DC
  Administered 2017-10-01 – 2017-10-04 (×4): 100 mg via ORAL
  Filled 2017-09-30 (×4): qty 1

## 2017-09-30 MED ORDER — CALCIUM CARBONATE-VITAMIN D 500-200 MG-UNIT PO TABS
1.0000 | ORAL_TABLET | Freq: Every day | ORAL | Status: DC
  Administered 2017-10-01 – 2017-10-04 (×4): 1 via ORAL
  Filled 2017-09-30 (×5): qty 1

## 2017-09-30 MED ORDER — LEVOTHYROXINE SODIUM 25 MCG PO TABS
25.0000 ug | ORAL_TABLET | Freq: Every day | ORAL | Status: DC
  Administered 2017-10-01 – 2017-10-04 (×4): 25 ug via ORAL
  Filled 2017-09-30 (×4): qty 1

## 2017-09-30 MED ORDER — KETOROLAC TROMETHAMINE 15 MG/ML IJ SOLN: 15.0000 mg | Freq: Once | INTRAMUSCULAR | Status: DC

## 2017-09-30 MED ORDER — KETOROLAC TROMETHAMINE 15 MG/ML IJ SOLN
15.0000 mg | Freq: Four times a day (QID) | INTRAMUSCULAR | Status: DC | PRN
  Administered 2017-10-04: 15 mg via INTRAVENOUS
  Filled 2017-09-30: qty 1

## 2017-09-30 MED ORDER — TRAZODONE HCL 50 MG PO TABS
150.0000 mg | ORAL_TABLET | Freq: Every day | ORAL | Status: DC
  Administered 2017-09-30 – 2017-10-03 (×4): 150 mg via ORAL
  Filled 2017-09-30 (×4): qty 1

## 2017-09-30 MED ORDER — ASPIRIN EC 81 MG PO TBEC
81.0000 mg | DELAYED_RELEASE_TABLET | Freq: Every day | ORAL | Status: DC
  Administered 2017-10-01 – 2017-10-04 (×4): 81 mg via ORAL
  Filled 2017-09-30 (×4): qty 1

## 2017-09-30 MED ORDER — SODIUM CHLORIDE 0.9% FLUSH
3.0000 mL | Freq: Two times a day (BID) | INTRAVENOUS | Status: DC
  Administered 2017-09-30 – 2017-10-04 (×6): 3 mL via INTRAVENOUS

## 2017-09-30 MED ORDER — SODIUM CHLORIDE 0.9% FLUSH: 3.0000 mL | INTRAVENOUS | Status: DC | PRN

## 2017-09-30 MED ORDER — DEXAMETHASONE SODIUM PHOSPHATE 10 MG/ML IJ SOLN
10.0000 mg | Freq: Once | INTRAMUSCULAR | Status: AC
  Administered 2017-09-30: 10 mg via INTRAVENOUS
  Filled 2017-09-30: qty 1

## 2017-09-30 MED ORDER — SODIUM CHLORIDE 0.9 % IV SOLN: 250.0000 mL | INTRAVENOUS | Status: DC | PRN

## 2017-09-30 MED ORDER — HEPARIN SODIUM (PORCINE) 5000 UNIT/ML IJ SOLN
5000.0000 [IU] | Freq: Three times a day (TID) | INTRAMUSCULAR | Status: DC
  Administered 2017-09-30 – 2017-10-03 (×9): 5000 [IU] via SUBCUTANEOUS
  Filled 2017-09-30 (×10): qty 1

## 2017-09-30 MED ORDER — MORPHINE SULFATE (PF) 2 MG/ML IV SOLN
1.0000 mg | INTRAVENOUS | Status: DC | PRN
  Administered 2017-10-04: 1 mg via INTRAVENOUS
  Filled 2017-09-30: qty 1

## 2017-09-30 MED ORDER — OXYCODONE HCL 5 MG PO TABS
5.0000 mg | ORAL_TABLET | ORAL | Status: DC | PRN
  Administered 2017-10-01 – 2017-10-04 (×11): 5 mg via ORAL
  Filled 2017-09-30 (×12): qty 1

## 2017-09-30 NOTE — ED Notes (Signed)
ED Provider at bedside. 

## 2017-09-30 NOTE — ED Notes (Signed)
Patient transported to MRI 

## 2017-09-30 NOTE — ED Provider Notes (Signed)
[facility] EMERGENCY DEPARTMENT Provider Note   CSN: [id number] Arrival date & time: 09/30/17  4098     History   Chief Complaint Chief Complaint  Patient presents with  . Fall  . Weakness    HPI [name] [name] is a 72 y.o. female.  HPI   71 year old female with past medical history as below who presents with ongoing back pain.  The patient states that approximately 1 week ago, she developed progressively worsening aching, gnawing, throbbing lower back pain.  The pain is worse with any kind of movement or palpation.  Over the last several days, she has had 2-3 falls due to this back pain in her legs giving out.  She is now having difficulty moving her right leg.  She denies any loss of bowel or bladder function.  She was seen by her PCP and was sent for an MRI today, which reportedly was denied due to her insurance.  However, she was unable to walk or get out of the car so she was brought here by EMS.  Currently, the patient complains of 10 out of 10 aching, throbbing lower back pain.  The pain is exquisite and worse with any movement.  Of note, the family also notes that the patient has had slightly more slurred speech throughout the day today.  The patient denies any difficulty speaking.  Denies of difficulty swallowing.  She has not taken any new pain medications.  No other medical complaints.  Past Medical History:  Diagnosis Date  . Aneurysm (HCC)   . Anxiety   . Anxiety and depression   . Carotid stenosis    R 50% (12/2012)  . Concussion 08/03/2015  . COPD (chronic obstructive pulmonary disease) (HCC) 12/2012   spirometry: Pre: FVC 84%, FEV1 69%, ratio 0.64 consistent with moderate obstruction.  . Depression   . Fall 08/03/2015   d/c home health 08/2015  . Fracture of cervical vertebra, C5 (HCC) 08/06/2015  . History of chicken pox   . Hyperlipidemia   . Hypertension   . Lower back pain    h/o HNP s/p surgery  . Osteoarthritis    h/o ruptured disc  s/p ESI  . Osteoporosis 11/2010   DEXA -2.7 spine, thoracic compression fracture  . Peripheral vascular disease (HCC)   . Smoker    quit 10/2012  . Stroke [facility]) 2010   x3 with residual R hemiparesis, s/p R MCA balloon angioplasty (2010)    Patient Active Problem List   Diagnosis Date Noted  . Hypothyroidism (acquired) 06/28/2017  . Vitamin D deficiency 06/25/2017  . Dysphagia 03/19/2015  . Advanced care planning/counseling discussion 01/29/2015  . Health maintenance examination 01/29/2015  . Carotid stenosis 04/03/2014  . Carotid artery aneurysm (HCC) 04/03/2014  . Recurrent falls 01/02/2014  . Hemiparesis affecting right side as late effect of stroke (HCC) 01/02/2014  . Insomnia 01/10/2013  . Medicare annual wellness visit, subsequent 11/26/2012  . Hypertension   . History of stroke   . Peripheral vascular disease (HCC)   . COPD (chronic obstructive pulmonary disease) (HCC)   . Hyperlipidemia   . Osteoarthritis   . MDD (major depressive disorder), recurrent episode, moderate (HCC)   . Ex-smoker   . Osteoporosis 11/25/2010    Past Surgical History:  Procedure Laterality Date  . APPENDECTOMY  1960  . CATARACT EXTRACTION     bilateral  . CESAREAN SECTION    . CHOLECYSTECTOMY  1970  . COLONOSCOPY  2004   diverticulosis, no  polyps Jarold Motto)  . COLONOSCOPY  05/2016   decreased sphincter tone, diverticulosis, no f/u recommended (Danis)  . DEXA  11/2010   T -2.7 spine, -1.9 hip  . HIP SURGERY Left 2006   fractured - screws placed  . RADIOLOGY WITH ANESTHESIA N/A 11/11/2015   Procedure: RADIOLOGY WITH ANESTHESIA;  Surgeon: Julieanne Cotton, MD;  Location: [facility] OR;  Service: Radiology;  Laterality: N/A;    OB History    No data available       Home Medications    Prior to Admission medications   Medication Sig Start Date End Date Taking? Authorizing Provider  acetaminophen (TYLENOL) 500 MG tablet Take 500 mg by mouth 2 (two) times daily as needed for mild pain.     Yes [provider]  albuterol (PROVENTIL HFA;VENTOLIN HFA) 108 (90 BASE) MCG/ACT inhaler Inhale 2 puffs into the lungs every 6 (six) hours as needed for wheezing or shortness of breath.    Yes [provider]  alendronate (FOSAMAX) 70 MG tablet TAKE 1 TABLET WEEKLY  WITH A FULL GLASS OF WATER ON AN EMPTY STOMACH 10/11/16  Yes [name], MD  aspirin EC 81 MG tablet Take 81 mg by mouth daily.   Yes [provider]  atorvastatin (LIPITOR) 40 MG tablet TAKE 1 TABLET (40 MG TOTAL) BY MOUTH DAILY. 08/23/17  Yes [name], MD  benazepril (LOTENSIN) 10 MG tablet Take 1 tablet (10 mg total) by mouth daily. 06/28/17  Yes [name], MD  Calcium Carb-Cholecalciferol (CALCIUM-VITAMIN D) 600-400 MG-UNIT TABS Take 1 tablet by mouth daily.   Yes [provider]  Cholecalciferol (VITAMIN D PO) Take 1 capsule by mouth daily.   Yes [provider]  clopidogrel (PLAVIX) 75 MG tablet TAKE 1 TABLET EVERY DAY 08/23/17  Yes [name], MD  diazepam (VALIUM) 5 MG tablet Take 5 mg by mouth once as needed. In preparation for MRI as directed 09/28/17  Yes [provider]  fexofenadine (ALLEGRA) 180 MG tablet Take 180 mg by mouth daily as needed for allergies.    Yes [provider]  levothyroxine (SYNTHROID, LEVOTHROID) 25 MCG tablet Take 1 tablet (25 mcg total) by mouth daily before breakfast. 06/28/17  Yes [name], MD  sertraline (ZOLOFT) 100 MG tablet Take 1 tablet (100 mg total) by mouth daily. 06/28/17  Yes [name], MD  traMADol (ULTRAM) 50 MG tablet TAKE 1 TABLET BY MOUTH TWICE DAILY AS NEEDED FOR PAIN 09/29/17  Yes [name], MD  traZODone (DESYREL) 100 MG tablet Take 1.5 tablets (150 mg total) by mouth at bedtime. for sleep 06/28/17  Yes [name], MD    Family History Family History  Problem Relation Age of Onset  . CAD Mother        MI  . Cancer Mother        cervical  . CAD Maternal Aunt          MI  . CAD Sister        MI  . Sudden death Father 74       died in his sleep, chain smoker  . Cirrhosis Father   . Hypertension Daughter   . Diabetes Maternal Aunt   . Cancer Maternal Grandmother        cervical    Social History Social History   Tobacco Use  . Smoking status: Former Smoker    Packs/day: 1.00    Years: 46.00    Pack years: 46.00    Types: Cigarettes  Last attempt to quit: 11/08/2012    Years since quitting: 4.8  . Smokeless tobacco: Never Used  Substance Use Topics  . Alcohol use: No    Alcohol/week: 0.0 oz  . Drug use: No     Allergies   Doxycycline   Review of Systems Review of Systems  Musculoskeletal: Positive for arthralgias, back pain and gait problem.  All other systems reviewed and are negative.    Physical Exam Updated Vital Signs BP (!) 145/83   Pulse 67   Temp 99.2 F (37.3 C) (Oral)   Resp 13   SpO2 92%   Physical Exam  Constitutional: She is oriented to person, place, and time. She appears well-developed and well-nourished. She appears distressed (appears in pain).  HENT:  Head: Normocephalic and atraumatic.  Eyes: Conjunctivae are normal.  Neck: Neck supple.  Cardiovascular: Normal rate, regular rhythm and normal heart sounds. Exam reveals no friction rub.  No murmur heard. Pulmonary/Chest: Effort normal and breath sounds normal. No respiratory distress. She has no wheezes. She has no rales.  Abdominal: She exhibits no distension.  Musculoskeletal: She exhibits no edema.  Neurological: She is alert and oriented to person, place, and time. She exhibits normal muscle tone.  Skin: Skin is warm. Capillary refill takes less than 2 seconds.  Psychiatric: She has a normal mood and affect.  Nursing note and vitals reviewed.   Spine Exam: Inspection/Palpation: Exquisite tenderness throughout lower midline lumbar spine.  No bruising or deformity.  No step-offs. Strength: Strength is at least 4+ out of 5 in bilateral  upper extremities.  Flexion and extension at the right hip is markedly limited due to severe pain.  Extension and flexion at the knee is also limited due to pain. Sensation: Intact to light touch in proximal and distal LE bilaterally Reflexes: 1+ quadriceps and achilles reflexes  ED Treatments / Results  Labs (all labs ordered are listed, but only abnormal results are displayed) Labs Reviewed  BASIC METABOLIC PANEL - Abnormal; Notable for the following components:      Result Value   GFR calc non Af Amer 56 (*)    All other components within normal limits  URINALYSIS, ROUTINE W REFLEX MICROSCOPIC - Abnormal; Notable for the following components:   Leukocytes, UA SMALL (*)    Bacteria, UA RARE (*)    Squamous Epithelial / LPF 6-30 (*)    All other components within normal limits  CBC    EKG  EKG Interpretation  Date/Time:  Saturday September 30 2017 09:07:27 EST Ventricular Rate:  78 PR Interval:  176 QRS Duration: 70 QT Interval:  368 QTC Calculation: 419 R Axis:   -8 Text Interpretation:  Normal sinus rhythm Normal ECG No significant change since last tracing Confirmed by Shaune Pollack [phone]) on 09/30/2017 11:58:30 AM       Radiology Dg Lumbar Spine Complete  Result Date: 09/30/2017 CLINICAL DATA:  Severe back pain, history of fractured EXAM: LUMBAR SPINE - COMPLETE 4+ VIEW COMPARISON:  CT abdomen and pelvis 08/01/2015 FINDINGS: Diffuse osseous demineralization. Six non-rib-bearing lumbar type vertebra, though prior CT exam is labeled with 5 lumbar vertebra, current exam labeled to match for consistency. Compression fractures are identified at superior endplate L4, inferior endplate L1, and superior endplate T11, appear grossly stable. Mild endplate concavity at superior L5 and inferior L4 noted. Facet degenerative changes lower lumbar spine with mild disc space narrowing at L1-L2. No new fracture, subluxation or bone destruction. No spondylolysis. SI joints preserved. Scattered  atherosclerotic  calcifications aorta and iliac arteries. IMPRESSION: Osseous demineralization with mild scattered degenerative disc and facet disease changes. Chronic compression fractures of T11, L1, and L4. No acute abnormalities. Electronically Signed   By: Ulyses SouthwardMark  Boles M.D.   On: 09/30/2017 12:47   Mr Brain Wo Contrast  Result Date: 09/30/2017 CLINICAL DATA:  Back and RIGHT leg pain since before Christmas. Now unable to walk due to weakness, and frequent falls. Family reports slurred speech. Possible stroke. EXAM: MRI HEAD WITHOUT CONTRAST TECHNIQUE: Multiplanar, multiecho pulse sequences of the brain and surrounding structures were obtained without intravenous contrast. COMPARISON:  MRI brain 05/11/2016. MRI lumbar spine reported separately. FINDINGS: Brain: No evidence for acute infarction, acute hemorrhage, mass lesion, hydrocephalus, or extra-axial fluid. Generalized atrophy. Chronic microvascular ischemic change. Partial empty sella. Vascular: Flow voids are maintained. Small cavernous carotid aneurysm on the RIGHT better seen on MRA. Tiny microbleeds in the cerebral hemispheres, RIGHT greater than LEFT stable from priors, likely sequelae of hypertensive cerebrovascular disease. Skull and upper cervical spine: Normal marrow signal. Sinuses/Orbits: Negative. Other: None. IMPRESSION: No acute intracranial findings. Atrophy and small vessel disease. Likely sequelae of hypertensive cerebrovascular disease, stable from priors. Lumbar spine MRI reported separately. Electronically Signed   By: Elsie StainJohn T Curnes M.D.   On: 09/30/2017 15:47   Mr Lumbar Spine Wo Contrast  Result Date: 09/30/2017 CLINICAL DATA:  Low back pain radiating into the right leg since 09/18/2017. Inability to walk due to weakness. Frequent falls. EXAM: MRI LUMBAR SPINE WITHOUT CONTRAST TECHNIQUE: Multiplanar, multisequence MR imaging of the lumbar spine was performed. No intravenous contrast was administered. COMPARISON:  Lumbar spine  radiographs 09/30/2017. CT abdomen and pelvis 08/01/2015. Lumbar spine MRI 03/31/2005. Chest CT 07/19/2017. FINDINGS: Segmentation: Comparison chest CT demonstrates absent ribs at T12. The lowest fully formed intervertebral disc space is L5-S1, and the right L5 transverse process is enlarged and articulates with the sacrum. Alignment:  Trace retrolisthesis of L1 on L2. Vertebrae: Chronic T11 superior and L1 inferior endplate compression fractures with mild-to-moderate vertebral body height loss. An L4 compression fracture has progressed from the prior abdominal CT and now involves the inferior endplate as well. There is moderate marrow edema in the L4 vertebral body, and there is 75% vertebral body height loss centrally. A fluid-filled cleft is noted along the L4 inferior endplate. There is no L4 retropulsion. A left-sided L5 superior endplate compression deformity is unchanged from the prior abdominal CT. No suspicious osseous lesion is identified. Conus medullaris and cauda equina: Conus extends to the T12-L1 level. Conus and cauda equina appear normal. Paraspinal and other soft tissues: T2 hyperintense lesions in both kidneys measuring up to 2.6 cm in size, likely cysts but incompletely evaluated. Abdominal aortic atherosclerosis with similar appearance of slight infrarenal ectasia. Disc levels: Disc desiccation throughout the lumbar and included lower thoracic spine. Mild disc space narrowing at T11-12. Preserved lumbar disc space heights. T11-12: Small central disc protrusion is stable to minimally larger than on the prior MRI. No stenosis or spinal cord mass effect. T12-L1: Mild disc bulging and new or larger shallow central disc protrusion without stenosis. L1-2:  New mild disc bulging without stenosis. L2-3:  Negative. L3-4:  New minimal disc bulging without stenosis. L4-5: New minimal disc bulging and mild mildly progressive facet hypertrophy without stenosis. L5-S1:  Mild facet arthrosis without disc  herniation or stenosis. IMPRESSION: 1. L4 compression fracture with progressive, now severe central height loss since 08/01/2015 including new inferior endplate involvement. Moderate marrow edema. No retropulsion. 2. Multiple chronic  lumbar and lower thoracic compression fractures. 3. Mild lumbar spondylosis and facet arthrosis without stenosis. Electronically Signed   By: Sebastian Ache M.D.   On: 09/30/2017 15:59    Procedures Procedures (including critical care time)  Medications Ordered in ED Medications  morphine 4 MG/ML injection 4 mg (not administered)  morphine 4 MG/ML injection 4 mg (4 mg Intravenous Given 09/30/17 1312)  dexamethasone (DECADRON) injection 10 mg (10 mg Intravenous Given 09/30/17 1344)     Initial Impression / Assessment and Plan / ED Course  I have reviewed the triage vital signs and the nursing notes.  Pertinent labs & imaging results that were available during my care of the patient were reviewed by me and considered in my medical decision making (see chart for details).    72 year old female here with worsening lower back pain and difficulty ambulating in the setting of multiple recent falls.  Plain films show multiple old compression fractures.  However, given her degree of pain and reported radicular symptoms, MRI obtained which shows no acute on chronic severe lumbar compression fracture with marrow edema.  She has no signs of cauda equina.  Discussed with Dr. Franky Macho who will see the patient.  Will order a brace and obtain CT scan per his discussion.  Will admit to medicine for pain control, PT OT, and further management.  Final Clinical Impressions(s) / ED Diagnoses   Final diagnoses:  Closed compression fracture of fourth lumbar vertebra, initial encounter [facility])    ED Discharge Orders    None       Shaune Pollack, MD 09/30/17 1646

## 2017-09-30 NOTE — ED Triage Notes (Signed)
Pt and family reports pt having back pain into right leg since 12/24. Had appt today for MRI but it was canceled. Family reports pt unable to walk due to weakness, having frequent falls. Today noticed that speech is slurred, unsure of onset.

## 2017-09-30 NOTE — ED Notes (Signed)
Attempted report x 2 

## 2017-09-30 NOTE — H&P (Signed)
History and Physical    [name] McgregorHattie S [name] ION:[id number] DOB: 11-Nov-1945 DOA: 09/30/2017  PCP: Eustaquio BoydenGutierrez, Javier, MD  Patient coming from: home  I have personally briefly reviewed patient's [facility] medical [facility]  Chief Complaint: pain and difficulty walking  HPI: [name] [name] is a 72 y.o. female with medical history significant of presented with 2 or 3 days and worsening discomfort back pain and inability to walk secondary to pain and some reported weakness. This is been persistent. Bearing weight on her legs makes it worse. Patient reports that roughly a week ago she fell getting out of bed.  ED Course: MRI which showed compression fracture and neurosurgery and internal medicine were consulted for further evaluation recommendations MRI of lumbar spine reports: L4 compression fracture with progressive, now severe central height loss since 08/01/2015 including new inferior endplate involvement. Moderate marrow edema. No retropulsion.  Review of Systems: As per HPI otherwise 10 point review of systems negative.   Past Medical History:  Diagnosis Date  . Aneurysm (HCC)   . Anxiety   . Anxiety and depression   . Carotid stenosis    R 50% (12/2012)  . Concussion 08/03/2015  . COPD (chronic obstructive pulmonary disease) (HCC) 12/2012   spirometry: Pre: FVC 84%, FEV1 69%, ratio 0.64 consistent with moderate obstruction.  . Depression   . Fall 08/03/2015   d/c home health 08/2015  . Fracture of cervical vertebra, C5 (HCC) 08/06/2015  . History of chicken pox   . Hyperlipidemia   . Hypertension   . Lower back pain    h/o HNP s/p surgery  . Osteoarthritis    h/o ruptured disc s/p ESI  . Osteoporosis 11/2010   DEXA -2.7 spine, thoracic compression fracture  . Peripheral vascular disease (HCC)   . Smoker    quit 10/2012  . Stroke [facility](HCC) 2010   x3 with residual R hemiparesis, s/p R MCA balloon angioplasty (2010)    Past Surgical History:  Procedure Laterality  Date  . APPENDECTOMY  1960  . CATARACT EXTRACTION     bilateral  . CESAREAN SECTION    . CHOLECYSTECTOMY  1970  . COLONOSCOPY  2004   diverticulosis, no polyps Jarold Motto([place])  . COLONOSCOPY  05/2016   decreased sphincter tone, diverticulosis, no f/u recommended ([place])  . DEXA  11/2010   T -2.7 spine, -1.9 hip  . HIP SURGERY Left 2006   fractured - screws placed  . RADIOLOGY WITH ANESTHESIA N/A 11/11/2015   Procedure: RADIOLOGY WITH ANESTHESIA;  Surgeon: Julieanne CottonSanjeev Deveshwar, MD;  Location: [facility] OR;  Service: Radiology;  Laterality: N/A;     reports that she quit smoking about 4 years ago. Her smoking use included cigarettes. She has a 46.00 pack-year smoking history. she has never used smokeless tobacco. She reports that she does not drink alcohol or use drugs.  Allergies  Allergen Reactions  . Doxycycline Other (See Comments)    Sig GI upset    Family History  Problem Relation Age of Onset  . CAD Mother        MI  . Cancer Mother        cervical  . CAD Maternal Aunt        MI  . CAD Sister        MI  . Sudden death Father [id number]       died in his sleep, chain smoker  . Cirrhosis Father   . Hypertension Daughter   . Diabetes Maternal Aunt   .  Cancer Maternal Grandmother        cervical    Prior to Admission medications   Medication Sig Start Date End Date Taking? Authorizing Provider  acetaminophen (TYLENOL) 500 MG tablet Take 500 mg by mouth 2 (two) times daily as needed for mild pain.    Yes [provider]  albuterol (PROVENTIL HFA;VENTOLIN HFA) 108 (90 BASE) MCG/ACT inhaler Inhale 2 puffs into the lungs every 6 (six) hours as needed for wheezing or shortness of breath.    Yes [provider]  alendronate (FOSAMAX) 70 MG tablet TAKE 1 TABLET WEEKLY  WITH A FULL GLASS OF WATER ON AN EMPTY STOMACH 10/11/16  Yes [name], MD  aspirin EC 81 MG tablet Take 81 mg by mouth daily.   Yes [provider]  atorvastatin (LIPITOR) 40 MG tablet TAKE 1  TABLET (40 MG TOTAL) BY MOUTH DAILY. 08/23/17  Yes [name], MD  benazepril (LOTENSIN) 10 MG tablet Take 1 tablet (10 mg total) by mouth daily. 06/28/17  Yes [name], MD  Calcium Carb-Cholecalciferol (CALCIUM-VITAMIN D) 600-400 MG-UNIT TABS Take 1 tablet by mouth daily.   Yes [provider]  Cholecalciferol (VITAMIN D PO) Take 1 capsule by mouth daily.   Yes [provider]  clopidogrel (PLAVIX) 75 MG tablet TAKE 1 TABLET EVERY DAY 08/23/17  Yes [name], MD  diazepam (VALIUM) 5 MG tablet Take 5 mg by mouth once as needed. In preparation for MRI as directed 09/28/17  Yes [provider]  fexofenadine (ALLEGRA) 180 MG tablet Take 180 mg by mouth daily as needed for allergies.    Yes [provider]  levothyroxine (SYNTHROID, LEVOTHROID) 25 MCG tablet Take 1 tablet (25 mcg total) by mouth daily before breakfast. 06/28/17  Yes [name], MD  sertraline (ZOLOFT) 100 MG tablet Take 1 tablet (100 mg total) by mouth daily. 06/28/17  Yes [name], MD  traMADol (ULTRAM) 50 MG tablet TAKE 1 TABLET BY MOUTH TWICE DAILY AS NEEDED FOR PAIN 09/29/17  Yes [name], MD  traZODone (DESYREL) 100 MG tablet Take 1.5 tablets (150 mg total) by mouth at bedtime. for sleep 06/28/17  Yes [name], MD    Physical Exam: Vitals:   09/30/17 1715 09/30/17 1730 09/30/17 1745 09/30/17 1800  BP: (!) 111/58 (!) 130/50 (!) 134/57 (!) 133/57  Pulse: 73 (!) 56 71 68  Resp: 14  18 10   Temp:      TempSrc:      SpO2: 93% 97% 98% 99%    Constitutional: NAD, calm, comfortable Vitals:   09/30/17 1715 09/30/17 1730 09/30/17 1745 09/30/17 1800  BP: (!) 111/58 (!) 130/50 (!) 134/57 (!) 133/57  Pulse: 73 (!) 56 71 68  Resp: 14  18 10   Temp:      TempSrc:      SpO2: 93% 97% 98% 99%   Eyes: PERRL, lids and conjunctivae normal ENMT: Mucous membranes are moist. Posterior pharynx clear of any exudate or lesions. Normal dentition.  Neck:  normal, supple, no masses, no thyromegaly Respiratory: clear to auscultation bilaterally, no wheezing, no crackles. Normal respiratory effort. No accessory muscle use.  Cardiovascular: Regular rate and rhythm, no murmurs / rubs / gallops.   Abdomen: no tenderness, no masses palpated. No hepatosplenomegaly. Bowel sounds positive.  Musculoskeletal: no clubbing / cyanosis. No joint deformity upper and lower extremities. Good ROM, no contractures. Normal muscle tone.  Skin: no rashes, lesions, ulcers. No induration, on limited exam. Neurologic: No facial asymmetry, moves extremities,  sensation to light touch intact  Psychiatry: Normal judgment and insight. Alert and oriented x 3.   Labs on Admission: I have personally reviewed following labs and imaging studies  CBC: Recent Labs  Lab 09/30/17 0901  WBC 5.8  HGB 12.6  HCT 39.8  MCV 92.8  PLT 161   Basic Metabolic Panel: Recent Labs  Lab 09/30/17 0901  NA 139  K 4.5  CL 101  CO2 27  GLUCOSE 91  BUN 14  CREATININE 0.99  CALCIUM 9.3   GFR: Estimated Creatinine Clearance: 48.1 mL/min (by C-G formula based on SCr of 0.99 mg/dL). Liver Function Tests: No results for input(s): AST, ALT, ALKPHOS, BILITOT, PROT, ALBUMIN in the last 168 hours. No results for input(s): LIPASE, AMYLASE in the last 168 hours. No results for input(s): AMMONIA in the last 168 hours. Coagulation Profile: No results for input(s): INR, PROTIME in the last 168 hours. Cardiac Enzymes: No results for input(s): CKTOTAL, CKMB, CKMBINDEX, TROPONINI in the last 168 hours. BNP (last 3 results) No results for input(s): PROBNP in the last 8760 hours. HbA1C: No results for input(s): HGBA1C in the last 72 hours. CBG: No results for input(s): GLUCAP in the last 168 hours. Lipid Profile: No results for input(s): CHOL, HDL, LDLCALC, TRIG, CHOLHDL, LDLDIRECT in the last 72 hours. Thyroid Function Tests: No results for input(s): TSH, T4TOTAL, FREET4, T3FREE, THYROIDAB  in the last 72 hours. Anemia Panel: No results for input(s): VITAMINB12, FOLATE, FERRITIN, TIBC, IRON, RETICCTPCT in the last 72 hours. Urine analysis:    Component Value Date/Time   COLORURINE YELLOW 09/30/2017 1616   APPEARANCEUR CLEAR 09/30/2017 1616   LABSPEC 1.012 09/30/2017 1616   PHURINE 6.0 09/30/2017 1616   GLUCOSEU NEGATIVE 09/30/2017 1616   HGBUR NEGATIVE 09/30/2017 1616   BILIRUBINUR NEGATIVE 09/30/2017 1616   KETONESUR NEGATIVE 09/30/2017 1616   PROTEINUR NEGATIVE 09/30/2017 1616   UROBILINOGEN 0.2 08/02/2015 1200   NITRITE NEGATIVE 09/30/2017 1616   LEUKOCYTESUR SMALL (A) 09/30/2017 1616    Radiological Exams on Admission: Dg Lumbar Spine Complete  Result Date: 09/30/2017 CLINICAL DATA:  Severe back pain, history of fractured EXAM: LUMBAR SPINE - COMPLETE 4+ VIEW COMPARISON:  CT abdomen and pelvis 08/01/2015 FINDINGS: Diffuse osseous demineralization. Six non-rib-bearing lumbar type vertebra, though prior CT exam is labeled with 5 lumbar vertebra, current exam labeled to match for consistency. Compression fractures are identified at superior endplate L4, inferior endplate L1, and superior endplate T11, appear grossly stable. Mild endplate concavity at superior L5 and inferior L4 noted. Facet degenerative changes lower lumbar spine with mild disc space narrowing at L1-L2. No new fracture, subluxation or bone destruction. No spondylolysis. SI joints preserved. Scattered atherosclerotic calcifications aorta and iliac arteries. IMPRESSION: Osseous demineralization with mild scattered degenerative disc and facet disease changes. Chronic compression fractures of T11, L1, and L4. No acute abnormalities. Electronically Signed   By: Ulyses Southward M.D.   On: 09/30/2017 12:47   Ct Lumbar Spine Wo Contrast  Result Date: 09/30/2017 CLINICAL DATA:  Status post fall. Has low back pain with RLE radiculopathy. Patient was not getting better so came to ED. EXAM: CT LUMBAR SPINE WITHOUT CONTRAST  TECHNIQUE: Multidetector CT imaging of the lumbar spine was performed without intravenous contrast administration. Multiplanar CT image reconstructions were also generated. COMPARISON:  MRI lumbar spine performed same day CT abdomen 08/01/2015 FINDINGS: Segmentation: 5 lumbar type vertebrae. Alignment: Normal. Vertebrae: L4 vertebral body compression fracture with approximately 60% central height loss and cortical discontinuity along the inferior  endplate. Chronic L1 vertebral body compression fracture with 3 mm of retropulsion of the posterior inferior margin of the L1 vertebral body. Mild chronic L5 vertebral body compression fracture with approximately 10% height loss. Chronic partially visualized T11 vertebral body compression fracture with approximately 50% height loss. Generalized osteopenia. Mild osteoarthritis of bilateral sacroiliac joints. Remainder the vertebral body heights are maintained. No aggressive osseous lesion. No discitis or osteomyelitis. Paraspinal and other soft tissues: No acute paraspinal abnormality. Abdominal aortic atherosclerosis. Infrarenal abdominal aortic ectasia. Bilateral renovascular atherosclerosis. Disc levels: Disc spaces: Disc spaces are maintained. T12-L1: Mild broad-based disc bulge with a small shallow central disc protrusion. No foraminal or central canal stenosis. L1-L2: Mild broad-based disc bulge. No foraminal or central canal stenosis. L2-L3: No significant disc protrusion, foraminal stenosis or central canal stenosis. Mild bilateral facet arthropathy. L3-L4: Mild broad-based disc bulge. Mild bilateral facet arthropathy. No foraminal or central canal stenosis. L4-L5: Mild broad-based disc bulge. Moderate left and mild right facet arthropathy. No foraminal stenosis. L5-S1: No significant disc protrusion. Moderate bilateral facet arthropathy. No significant foraminal stenosis. IMPRESSION: 1. L4 vertebral body compression fracture with approximately 60% central height  loss which has progressed compared with prior CT abdomen dated 08/01/2015. This likely reflects acute fracture superimposed on a chronic compression fracture. 2. Chronic thoracolumbar compression fractures as described above. 3. Mild lumbar spine spondylosis as described above. 4.  Aortic Atherosclerosis (ICD10-I70.0). Electronically Signed   By: Elige Ko   On: 09/30/2017 17:30   Mr Brain Wo Contrast  Result Date: 09/30/2017 CLINICAL DATA:  Back and RIGHT leg pain since before Christmas. Now unable to walk due to weakness, and frequent falls. Family reports slurred speech. Possible stroke. EXAM: MRI HEAD WITHOUT CONTRAST TECHNIQUE: Multiplanar, multiecho pulse sequences of the brain and surrounding structures were obtained without intravenous contrast. COMPARISON:  MRI brain 05/11/2016. MRI lumbar spine reported separately. FINDINGS: Brain: No evidence for acute infarction, acute hemorrhage, mass lesion, hydrocephalus, or extra-axial fluid. Generalized atrophy. Chronic microvascular ischemic change. Partial empty sella. Vascular: Flow voids are maintained. Small cavernous carotid aneurysm on the RIGHT better seen on MRA. Tiny microbleeds in the cerebral hemispheres, RIGHT greater than LEFT stable from priors, likely sequelae of hypertensive cerebrovascular disease. Skull and upper cervical spine: Normal marrow signal. Sinuses/Orbits: Negative. Other: None. IMPRESSION: No acute intracranial findings. Atrophy and small vessel disease. Likely sequelae of hypertensive cerebrovascular disease, stable from priors. Lumbar spine MRI reported separately. Electronically Signed   By: Elsie Stain M.D.   On: 09/30/2017 15:47   Mr Lumbar Spine Wo Contrast  Result Date: 09/30/2017 CLINICAL DATA:  Low back pain radiating into the right leg since 09/18/2017. Inability to walk due to weakness. Frequent falls. EXAM: MRI LUMBAR SPINE WITHOUT CONTRAST TECHNIQUE: Multiplanar, multisequence MR imaging of the lumbar spine was  performed. No intravenous contrast was administered. COMPARISON:  Lumbar spine radiographs 09/30/2017. CT abdomen and pelvis 08/01/2015. Lumbar spine MRI 03/31/2005. Chest CT 07/19/2017. FINDINGS: Segmentation: Comparison chest CT demonstrates absent ribs at T12. The lowest fully formed intervertebral disc space is L5-S1, and the right L5 transverse process is enlarged and articulates with the sacrum. Alignment:  Trace retrolisthesis of L1 on L2. Vertebrae: Chronic T11 superior and L1 inferior endplate compression fractures with mild-to-moderate vertebral body height loss. An L4 compression fracture has progressed from the prior abdominal CT and now involves the inferior endplate as well. There is moderate marrow edema in the L4 vertebral body, and there is 75% vertebral body height loss centrally. A fluid-filled cleft  is noted along the L4 inferior endplate. There is no L4 retropulsion. A left-sided L5 superior endplate compression deformity is unchanged from the prior abdominal CT. No suspicious osseous lesion is identified. Conus medullaris and cauda equina: Conus extends to the T12-L1 level. Conus and cauda equina appear normal. Paraspinal and other soft tissues: T2 hyperintense lesions in both kidneys measuring up to 2.6 cm in size, likely cysts but incompletely evaluated. Abdominal aortic atherosclerosis with similar appearance of slight infrarenal ectasia. Disc levels: Disc desiccation throughout the lumbar and included lower thoracic spine. Mild disc space narrowing at T11-12. Preserved lumbar disc space heights. T11-12: Small central disc protrusion is stable to minimally larger than on the prior MRI. No stenosis or spinal cord mass effect. T12-L1: Mild disc bulging and new or larger shallow central disc protrusion without stenosis. L1-2:  New mild disc bulging without stenosis. L2-3:  Negative. L3-4:  New minimal disc bulging without stenosis. L4-5: New minimal disc bulging and mild mildly progressive  facet hypertrophy without stenosis. L5-S1:  Mild facet arthrosis without disc herniation or stenosis. IMPRESSION: 1. L4 compression fracture with progressive, now severe central height loss since 08/01/2015 including new inferior endplate involvement. Moderate marrow edema. No retropulsion. 2. Multiple chronic lumbar and lower thoracic compression fractures. 3. Mild lumbar spondylosis and facet arthrosis without stenosis. Electronically Signed   By: Sebastian Ache M.D.   On: 09/30/2017 15:59    EKG: Independently reviewed. Sinus rhythm with no St elevations or depressions  Assessment/Plan    Compression fracture of body of thoracic vertebra [facility]) - Neurosurgery consulted and will evaluate - continue supportive therapy  Active Problems:   Hypertension - Continue prior to admission medications regimen    Peripheral vascular disease (HCC) - pt on statin    COPD (chronic obstructive pulmonary disease) (HCC) - compensated currently - continue albuterol    Hypothyroidism (acquired) - continue pt on synthroid    DVT prophylaxis: heparin Code Status: full Family Communication: d/c patient and family member at bedside Disposition Plan: med surg Consults called: Neurosurgeon: [place] Admission status: observation   Penny Pia MD [facility] Pager [phone]  If 7PM-7AM, please contact night-coverage [email or web address] Password [email or web address]  09/30/2017, 6:19 PM

## 2017-09-30 NOTE — ED Notes (Signed)
Attempted report x1. 

## 2017-09-30 NOTE — Progress Notes (Signed)
Patient arrived in room from ED.  NAD.  Instructed patient on how to order dinner.  Stated she has no pain at this time.

## 2017-09-30 NOTE — Progress Notes (Signed)
Orthopedic Tech Progress Note Patient Details:  [name] McgregorHattie S [name] May 27, 1946 [id number] Called [employer] for brace order. Patient ID: [name] McgregorHattie S [name], female   DOB: May 27, 1946, 72 y.o.   MRN: [id number]   [name] [name], [name] [name] 09/30/2017, 7:31 PM

## 2017-09-30 NOTE — Consult Note (Signed)
Reason for Consult:Back pain, L4 compression fracture Referring Physician: [name] is an 71 y.o. female.  HPI: whom fell on Christmas eve and again yesterday. [name] [name] states [name] [name] back pain has been fairly severe since Christmas morning 2018, and has progressed. In the [facility] ED today an MRI revealed an acute on chronic compression fracture at L4. [name] [name] has no new neurologic deficits. I was asked to see [name] [name] for the compression fracture.   Past Medical History:  Diagnosis Date  . Aneurysm ([place])   . Anxiety   . Anxiety and depression   . Carotid stenosis    R 50% (12/2012)  . Concussion 08/03/2015  . COPD (chronic obstructive pulmonary disease) ([place]) 12/2012   spirometry: Pre: FVC 84%, FEV1 69%, ratio 0.64 consistent with moderate obstruction.  . Depression   . Fall 08/03/2015   d/c home health 08/2015  . Fracture of cervical vertebra, C5 ([facility]) 08/06/2015  . History of chicken pox   . Hyperlipidemia   . Hypertension   . Lower back pain    h/o HNP s/p surgery  . Osteoarthritis    h/o ruptured disc s/p ESI  . Osteoporosis 11/2010   DEXA -2.7 spine, thoracic compression fracture  . Peripheral vascular disease ([place])   . Smoker    quit 10/2012  . Stroke [facility]) 2010   x3 with residual R hemiparesis, s/p R MCA balloon angioplasty (2010)    Past Surgical History:  Procedure Laterality Date  . APPENDECTOMY  1960  . CATARACT EXTRACTION     bilateral  . CESAREAN SECTION    . CHOLECYSTECTOMY  1970  . COLONOSCOPY  2004   diverticulosis, no polyps [name] [name])  . COLONOSCOPY  05/2016   decreased sphincter tone, diverticulosis, no f/u recommended ([name] [name])  . DEXA  11/2010   T -2.7 spine, -1.9 hip  . HIP SURGERY Left 2006   fractured - screws placed  . RADIOLOGY WITH ANESTHESIA N/A 11/11/2015   Procedure: RADIOLOGY WITH ANESTHESIA;  Surgeon: [name] Bras, MD;  Location: [place];  Service: Radiology;  Laterality: N/A;    Family History  Problem Relation Age of Onset  .  CAD Mother        MI  . Cancer Mother        cervical  . CAD Maternal Aunt        MI  . CAD Sister        MI  . Sudden death Father 45       died in his sleep, chain smoker  . Cirrhosis Father   . Hypertension Daughter   . Diabetes Maternal Aunt   . Cancer Maternal Grandmother        cervical    Social History:  reports that [name] [name] quit smoking about 4 years ago. [name] [name] smoking use included cigarettes. [name] [name] has a 46.00 pack-year smoking history. [name] [name] has never used smokeless tobacco. [name] [name] reports that [name] [name] does not drink alcohol or use drugs.  Allergies:  Allergies  Allergen Reactions  . Doxycycline Other (See Comments)    Sig GI upset    Medications: I have reviewed the patient's current medications.  Results for orders placed or performed during the hospital encounter of 09/30/17 (from the past 48 hour(s))  Basic metabolic panel     Status: Abnormal   Collection Time: 09/30/17  9:01 AM  Result Value Ref Range   Sodium 139 135 - 145 mmol/L   Potassium 4.5 3.5 - 5.1 mmol/L   Chloride 101  101 - 111 mmol/L   CO2 27 22 - 32 mmol/L   Glucose, Bld 91 65 - 99 mg/dL   BUN 14 6 - 20 mg/dL   Creatinine, Ser 0.99 0.44 - 1.00 mg/dL   Calcium 9.3 8.9 - 10.3 mg/dL   GFR calc non Af Amer 56 (L) >60 mL/min   GFR calc Af Amer >60 >60 mL/min    Comment: (NOTE) The eGFR has been calculated using the CKD EPI equation. This calculation has not been validated in all clinical situations. eGFR's persistently <60 mL/min signify possible Chronic Kidney Disease.    Anion gap 11 5 - 15  CBC     Status: None   Collection Time: 09/30/17  9:01 AM  Result Value Ref Range   WBC 5.8 4.0 - 10.5 K/uL   RBC 4.29 3.87 - 5.11 MIL/uL   Hemoglobin 12.6 12.0 - 15.0 g/dL   HCT 39.8 36.0 - 46.0 %   MCV 92.8 78.0 - 100.0 fL   MCH 29.4 26.0 - 34.0 pg   MCHC 31.7 30.0 - 36.0 g/dL   RDW 12.9 11.5 - 15.5 %   Platelets 161 150 - 400 K/uL  Urinalysis, Routine w reflex microscopic     Status: Abnormal   Collection  Time: 09/30/17  4:16 PM  Result Value Ref Range   Color, Urine YELLOW YELLOW   APPearance CLEAR CLEAR   Specific Gravity, Urine 1.012 1.005 - 1.030   pH 6.0 5.0 - 8.0   Glucose, UA NEGATIVE NEGATIVE mg/dL   Hgb urine dipstick NEGATIVE NEGATIVE   Bilirubin Urine NEGATIVE NEGATIVE   Ketones, ur NEGATIVE NEGATIVE mg/dL   Protein, ur NEGATIVE NEGATIVE mg/dL   Nitrite NEGATIVE NEGATIVE   Leukocytes, UA SMALL (A) NEGATIVE   RBC / HPF 0-5 0 - 5 RBC/hpf   WBC, UA 6-30 0 - 5 WBC/hpf   Bacteria, UA RARE (A) NONE SEEN   Squamous Epithelial / LPF 6-30 (A) NONE SEEN  CBC     Status: None   Collection Time: 09/30/17  6:59 PM  Result Value Ref Range   WBC 4.9 4.0 - 10.5 K/uL   RBC 4.26 3.87 - 5.11 MIL/uL   Hemoglobin 12.4 12.0 - 15.0 g/dL   HCT 39.5 36.0 - 46.0 %   MCV 92.7 78.0 - 100.0 fL   MCH 29.1 26.0 - 34.0 pg   MCHC 31.4 30.0 - 36.0 g/dL   RDW 12.8 11.5 - 15.5 %   Platelets 151 150 - 400 K/uL    Dg Lumbar Spine Complete  Result Date: 09/30/2017 CLINICAL DATA:  Severe back pain, history of fractured EXAM: LUMBAR SPINE - COMPLETE 4+ VIEW COMPARISON:  CT abdomen and pelvis 08/01/2015 FINDINGS: Diffuse osseous demineralization. Six non-rib-bearing lumbar type vertebra, though prior CT exam is labeled with 5 lumbar vertebra, current exam labeled to match for consistency. Compression fractures are identified at superior endplate L4, inferior endplate L1, and superior endplate T11, appear grossly stable. Mild endplate concavity at superior L5 and inferior L4 noted. Facet degenerative changes lower lumbar spine with mild disc space narrowing at L1-L2. No new fracture, subluxation or bone destruction. No spondylolysis. SI joints preserved. Scattered atherosclerotic calcifications aorta and iliac arteries. IMPRESSION: Osseous demineralization with mild scattered degenerative disc and facet disease changes. Chronic compression fractures of T11, L1, and L4. No acute abnormalities. Electronically Signed    By: [name] [name] M.D.   On: 09/30/2017 12:47   Ct Lumbar Spine Wo Contrast  Result Date: 09/30/2017  CLINICAL DATA:  Status post fall. Has low back pain with RLE radiculopathy. Patient was not getting better so came to ED. EXAM: CT LUMBAR SPINE WITHOUT CONTRAST TECHNIQUE: Multidetector CT imaging of the lumbar spine was performed without intravenous contrast administration. Multiplanar CT image reconstructions were also generated. COMPARISON:  MRI lumbar spine performed same day CT abdomen 08/01/2015 FINDINGS: Segmentation: 5 lumbar type vertebrae. Alignment: Normal. Vertebrae: L4 vertebral body compression fracture with approximately 60% central height loss and cortical discontinuity along the inferior endplate. Chronic L1 vertebral body compression fracture with 3 mm of retropulsion of the posterior inferior margin of the L1 vertebral body. Mild chronic L5 vertebral body compression fracture with approximately 10% height loss. Chronic partially visualized T11 vertebral body compression fracture with approximately 50% height loss. Generalized osteopenia. Mild osteoarthritis of bilateral sacroiliac joints. Remainder the vertebral body heights are maintained. No aggressive osseous lesion. No discitis or osteomyelitis. Paraspinal and other soft tissues: No acute paraspinal abnormality. Abdominal aortic atherosclerosis. Infrarenal abdominal aortic ectasia. Bilateral renovascular atherosclerosis. Disc levels: Disc spaces: Disc spaces are maintained. T12-L1: Mild broad-based disc bulge with a small shallow central disc protrusion. No foraminal or central canal stenosis. L1-L2: Mild broad-based disc bulge. No foraminal or central canal stenosis. L2-L3: No significant disc protrusion, foraminal stenosis or central canal stenosis. Mild bilateral facet arthropathy. L3-L4: Mild broad-based disc bulge. Mild bilateral facet arthropathy. No foraminal or central canal stenosis. L4-L5: Mild broad-based disc bulge. Moderate left  and mild right facet arthropathy. No foraminal stenosis. L5-S1: No significant disc protrusion. Moderate bilateral facet arthropathy. No significant foraminal stenosis. IMPRESSION: 1. L4 vertebral body compression fracture with approximately 60% central height loss which has progressed compared with prior CT abdomen dated 08/01/2015. This likely reflects acute fracture superimposed on a chronic compression fracture. 2. Chronic thoracolumbar compression fractures as described above. 3. Mild lumbar spine spondylosis as described above. 4.  Aortic Atherosclerosis (ICD10-I70.0). Electronically Signed   By: Kathreen Devoid   On: 09/30/2017 17:30   Mr Brain Wo Contrast  Result Date: 09/30/2017 CLINICAL DATA:  Back and RIGHT leg pain since before Christmas. Now unable to walk due to weakness, and frequent falls. Family reports slurred speech. Possible stroke. EXAM: MRI HEAD WITHOUT CONTRAST TECHNIQUE: Multiplanar, multiecho pulse sequences of the brain and surrounding structures were obtained without intravenous contrast. COMPARISON:  MRI brain 05/11/2016. MRI lumbar spine reported separately. FINDINGS: Brain: No evidence for acute infarction, acute hemorrhage, mass lesion, hydrocephalus, or extra-axial fluid. Generalized atrophy. Chronic microvascular ischemic change. Partial empty sella. Vascular: Flow voids are maintained. Small cavernous carotid aneurysm on the RIGHT better seen on MRA. Tiny microbleeds in the cerebral hemispheres, RIGHT greater than LEFT stable from priors, likely sequelae of hypertensive cerebrovascular disease. Skull and upper cervical spine: Normal marrow signal. Sinuses/Orbits: Negative. Other: None. IMPRESSION: No acute intracranial findings. Atrophy and small vessel disease. Likely sequelae of hypertensive cerebrovascular disease, stable from priors. Lumbar spine MRI reported separately. Electronically Signed   By: Staci Righter M.D.   On: 09/30/2017 15:47   Mr Lumbar Spine Wo  Contrast  Result Date: 09/30/2017 CLINICAL DATA:  Low back pain radiating into the right leg since 09/18/2017. Inability to walk due to weakness. Frequent falls. EXAM: MRI LUMBAR SPINE WITHOUT CONTRAST TECHNIQUE: Multiplanar, multisequence MR imaging of the lumbar spine was performed. No intravenous contrast was administered. COMPARISON:  Lumbar spine radiographs 09/30/2017. CT abdomen and pelvis 08/01/2015. Lumbar spine MRI 03/31/2005. Chest CT 07/19/2017. FINDINGS: Segmentation: Comparison chest CT demonstrates absent ribs at T12. The  lowest fully formed intervertebral disc space is L5-S1, and the right L5 transverse process is enlarged and articulates with the sacrum. Alignment:  Trace retrolisthesis of L1 on L2. Vertebrae: Chronic T11 superior and L1 inferior endplate compression fractures with mild-to-moderate vertebral body height loss. An L4 compression fracture has progressed from the prior abdominal CT and now involves the inferior endplate as well. There is moderate marrow edema in the L4 vertebral body, and there is 75% vertebral body height loss centrally. A fluid-filled cleft is noted along the L4 inferior endplate. There is no L4 retropulsion. A left-sided L5 superior endplate compression deformity is unchanged from the prior abdominal CT. No suspicious osseous lesion is identified. Conus medullaris and cauda equina: Conus extends to the T12-L1 level. Conus and cauda equina appear normal. Paraspinal and other soft tissues: T2 hyperintense lesions in both kidneys measuring up to 2.6 cm in size, likely cysts but incompletely evaluated. Abdominal aortic atherosclerosis with similar appearance of slight infrarenal ectasia. Disc levels: Disc desiccation throughout the lumbar and included lower thoracic spine. Mild disc space narrowing at T11-12. Preserved lumbar disc space heights. T11-12: Small central disc protrusion is stable to minimally larger than on the prior MRI. No stenosis or spinal cord mass  effect. T12-L1: Mild disc bulging and new or larger shallow central disc protrusion without stenosis. L1-2:  New mild disc bulging without stenosis. L2-3:  Negative. L3-4:  New minimal disc bulging without stenosis. L4-5: New minimal disc bulging and mild mildly progressive facet hypertrophy without stenosis. L5-S1:  Mild facet arthrosis without disc herniation or stenosis. IMPRESSION: 1. L4 compression fracture with progressive, now severe central height loss since 08/01/2015 including new inferior endplate involvement. Moderate marrow edema. No retropulsion. 2. Multiple chronic lumbar and lower thoracic compression fractures. 3. Mild lumbar spondylosis and facet arthrosis without stenosis. Electronically Signed   By: Logan Bores M.D.   On: 09/30/2017 15:59    Review of Systems  HENT: Negative.   Eyes: Negative.   Respiratory: Negative.   Cardiovascular: Negative.   Gastrointestinal: Negative.   Genitourinary: Negative.   Musculoskeletal: Positive for back pain and falls.  Skin: Negative.   Neurological: Positive for weakness.  Endo/Heme/Allergies: Negative.   Psychiatric/Behavioral: Negative.    Blood pressure (!) 132/49, pulse 70, temperature 98.3 F (36.8 C), temperature source Oral, resp. rate 14, SpO2 97 %. Physical Exam  Constitutional: [name] [name] is oriented to person, place, and time. [name] [name] appears well-developed and well-nourished. [name] [name] appears distressed.  HENT:  Head: Normocephalic and atraumatic.  Right Ear: External ear normal.  Left Ear: External ear normal.  Nose: Nose normal.  Mouth/Throat: Oropharynx is clear and moist.  Eyes: Conjunctivae and EOM are normal. Pupils are equal, round, and reactive to light.  Neck: Normal range of motion. Neck supple.  Cardiovascular: Normal rate and regular rhythm.  Respiratory: Effort normal and breath sounds normal.  GI: Soft.  Neurological: [name] [name] is alert and oriented to person, place, and time. [name] [name] has normal strength and normal reflexes. [name] [name]  displays normal reflexes. No cranial nerve deficit or sensory deficit. [name] [name] exhibits normal muscle tone. Coordination normal. [name] [name] displays no Babinski's sign on the right side. [name] [name] displays no Babinski's sign on the left side.  Reflex Scores:      Tricep reflexes are 2+ on the right side and 2+ on the left side.      Bicep reflexes are 2+ on the right side and 2+ on the left side.      Brachioradialis reflexes are 2+  on the right side and 2+ on the left side.      Patellar reflexes are 2+ on the right side and 2+ on the left side.      Achilles reflexes are 2+ on the right side and 2+ on the left side. Proprioception is intact.    Assessment/Plan: [name] [name] with an L4 compression fracture, with no canal compromise. I do believe [name] [name] is a good candidate for a Kyphoplasty at L4. I discussed this with [name] [name] and [name] [name] daughter. Risks and benefits including but not limited to bleeding, infection, cement emboli, nerve damage, and no pain relief were discussed. They understand and wish to proceed. I will try to schedule early in the next week, hoping for Monday. Would leave on bedrest, may use heparin, scd's would refrain from lovenox.  Jakyra Kenealy L 09/30/2017, 7:46 PM

## 2017-10-01 DIAGNOSIS — S32040A Wedge compression fracture of fourth lumbar vertebra, initial encounter for closed fracture: Secondary | ICD-10-CM | POA: Diagnosis not present

## 2017-10-01 LAB — BASIC METABOLIC PANEL
Anion gap: 6 (ref 5–15)
BUN: 21 mg/dL — ABNORMAL HIGH (ref 6–20)
CO2: 25 mmol/L (ref 22–32)
Calcium: 8.7 mg/dL — ABNORMAL LOW (ref 8.9–10.3)
Chloride: 103 mmol/L (ref 101–111)
Creatinine, Ser: 1 mg/dL (ref 0.44–1.00)
GFR calc Af Amer: >60 mL/min (ref >60)
GFR calc non Af Amer: 55 mL/min — ABNORMAL LOW (ref >60)
Glucose, Bld: 122 mg/dL — ABNORMAL HIGH (ref 65–99)
Potassium: 4.3 mmol/L (ref 3.5–5.1)
Sodium: 134 mmol/L — ABNORMAL LOW (ref 135–145)

## 2017-10-01 LAB — CBC
HCT: 36.7 % (ref 36.0–46.0)
Hemoglobin: 11.5 g/dL — ABNORMAL LOW (ref 12.0–15.0)
MCH: 28.8 pg (ref 26.0–34.0)
MCHC: 31.3 g/dL (ref 30.0–36.0)
MCV: 91.8 fL (ref 78.0–100.0)
Platelets: 174 10*3/uL (ref 150–400)
RBC: 4 MIL/uL (ref 3.87–5.11)
RDW: 12.7 % (ref 11.5–15.5)
WBC: 5.9 10*3/uL (ref 4.0–10.5)

## 2017-10-01 NOTE — Progress Notes (Signed)
PROGRESS NOTE    FRIDAY BEGNOCHE  ZOX:[id number] DOB: August 12, 1946 DOA: 09/30/2017 PCP: [name], MD    Brief Narrative:   72 y.o. female with medical history significant of presented with 2 or 3 days and worsening discomfort back pain and inability to walk secondary to pain and some reported weakness  Assessment & Plan:      Compression fracture of body of thoracic vertebra [facility]) - Neurosurgery consulted and plan is for kyphoplasty - continue supportive therapy - brace recommended by neurosurgery  Active Problems:   Hypertension - Continue prior to admission medications regimen    Peripheral vascular disease (HCC) - pt on statin    COPD (chronic obstructive pulmonary disease) (HCC) - compensated currently - continue albuterol    Hypothyroidism (acquired) - continue pt on synthroid    DVT prophylaxis: Heparin Code Status: Full Family Communication: None at bedside. Disposition Plan: pending improvement in condition and plans by neurosurgery   Consultants:   Neurosurgery   Procedures: None   Antimicrobials: None   Subjective: The patient reports no new problems. Still having discomfort at back  Objective: Vitals:   09/30/17 1800 09/30/17 1838 10/01/17 0549 10/01/17 1110  BP: (!) 133/57 (!) 132/49 (!) 115/50 138/65  Pulse: 68 70 64 64  Resp: 10 14 16 16   Temp:  98.3 F (36.8 C) 98 F (36.7 C)   TempSrc:  Oral Oral   SpO2: 99% 97% 98% 97%   No intake or output data in the 24 hours ending 10/01/17 1528 There were no vitals filed for this visit.  Examination:  General exam: Appears calm and comfortable, in nad. Respiratory system: Clear to auscultation. Respiratory effort normal. Equal chest rise.  Cardiovascular system: S1 & S2 heard, RRR. No JVD, murmurs, rubs, gallops or clicks. Gastrointestinal system: Abdomen is nondistended, soft and nontender. No organomegaly or masses felt. Normal bowel sounds heard. Central nervous system:  Alert and oriented. No facial asymmetry Extremities: warm, no deformities. Skin: No rashes, lesions or ulcers, on limited exam. Psychiatry:  Mood & affect appropriate.     Data Reviewed: I have personally reviewed following labs and imaging studies  CBC: Recent Labs  Lab 09/30/17 0901 09/30/17 1859 10/01/17 0411  WBC 5.8 4.9 5.9  HGB 12.6 12.4 11.5*  HCT 39.8 39.5 36.7  MCV 92.8 92.7 91.8  PLT 161 151 174   Basic Metabolic Panel: Recent Labs  Lab 09/30/17 0901 09/30/17 1859 10/01/17 0411  NA 139  --  134*  K 4.5  --  4.3  CL 101  --  103  CO2 27  --  25  GLUCOSE 91  --  122*  BUN 14  --  21*  CREATININE 0.99 0.94 1.00  CALCIUM 9.3  --  8.7*   GFR: Estimated Creatinine Clearance: 47.7 mL/min (by C-G formula based on SCr of 1 mg/dL). Liver Function Tests: No results for input(s): AST, ALT, ALKPHOS, BILITOT, PROT, ALBUMIN in the last 168 hours. No results for input(s): LIPASE, AMYLASE in the last 168 hours. No results for input(s): AMMONIA in the last 168 hours. Coagulation Profile: No results for input(s): INR, PROTIME in the last 168 hours. Cardiac Enzymes: No results for input(s): CKTOTAL, CKMB, CKMBINDEX, TROPONINI in the last 168 hours. BNP (last 3 results) No results for input(s): PROBNP in the last 8760 hours. HbA1C: No results for input(s): HGBA1C in the last 72 hours. CBG: No results for input(s): GLUCAP in the last 168 hours. Lipid Profile: No results for input(s):  CHOL, HDL, LDLCALC, TRIG, CHOLHDL, LDLDIRECT in the last 72 hours. Thyroid Function Tests: No results for input(s): TSH, T4TOTAL, FREET4, T3FREE, THYROIDAB in the last 72 hours. Anemia Panel: No results for input(s): VITAMINB12, FOLATE, FERRITIN, TIBC, IRON, RETICCTPCT in the last 72 hours. Sepsis Labs: No results for input(s): PROCALCITON, LATICACIDVEN in the last 168 hours.  No results found for this or any previous visit (from the past 240 hour(s)).       Radiology Studies: Dg  Lumbar Spine Complete  Result Date: 09/30/2017 CLINICAL DATA:  Severe back pain, history of fractured EXAM: LUMBAR SPINE - COMPLETE 4+ VIEW COMPARISON:  CT abdomen and pelvis 08/01/2015 FINDINGS: Diffuse osseous demineralization. Six non-rib-bearing lumbar type vertebra, though prior CT exam is labeled with 5 lumbar vertebra, current exam labeled to match for consistency. Compression fractures are identified at superior endplate L4, inferior endplate L1, and superior endplate T11, appear grossly stable. Mild endplate concavity at superior L5 and inferior L4 noted. Facet degenerative changes lower lumbar spine with mild disc space narrowing at L1-L2. No new fracture, subluxation or bone destruction. No spondylolysis. SI joints preserved. Scattered atherosclerotic calcifications aorta and iliac arteries. IMPRESSION: Osseous demineralization with mild scattered degenerative disc and facet disease changes. Chronic compression fractures of T11, L1, and L4. No acute abnormalities. Electronically Signed   By: Ulyses Southward M.D.   On: 09/30/2017 12:47   Ct Lumbar Spine Wo Contrast  Result Date: 09/30/2017 CLINICAL DATA:  Status post fall. Has low back pain with RLE radiculopathy. Patient was not getting better so came to ED. EXAM: CT LUMBAR SPINE WITHOUT CONTRAST TECHNIQUE: Multidetector CT imaging of the lumbar spine was performed without intravenous contrast administration. Multiplanar CT image reconstructions were also generated. COMPARISON:  MRI lumbar spine performed same day CT abdomen 08/01/2015 FINDINGS: Segmentation: 5 lumbar type vertebrae. Alignment: Normal. Vertebrae: L4 vertebral body compression fracture with approximately 60% central height loss and cortical discontinuity along the inferior endplate. Chronic L1 vertebral body compression fracture with 3 mm of retropulsion of the posterior inferior margin of the L1 vertebral body. Mild chronic L5 vertebral body compression fracture with approximately 10%  height loss. Chronic partially visualized T11 vertebral body compression fracture with approximately 50% height loss. Generalized osteopenia. Mild osteoarthritis of bilateral sacroiliac joints. Remainder the vertebral body heights are maintained. No aggressive osseous lesion. No discitis or osteomyelitis. Paraspinal and other soft tissues: No acute paraspinal abnormality. Abdominal aortic atherosclerosis. Infrarenal abdominal aortic ectasia. Bilateral renovascular atherosclerosis. Disc levels: Disc spaces: Disc spaces are maintained. T12-L1: Mild broad-based disc bulge with a small shallow central disc protrusion. No foraminal or central canal stenosis. L1-L2: Mild broad-based disc bulge. No foraminal or central canal stenosis. L2-L3: No significant disc protrusion, foraminal stenosis or central canal stenosis. Mild bilateral facet arthropathy. L3-L4: Mild broad-based disc bulge. Mild bilateral facet arthropathy. No foraminal or central canal stenosis. L4-L5: Mild broad-based disc bulge. Moderate left and mild right facet arthropathy. No foraminal stenosis. L5-S1: No significant disc protrusion. Moderate bilateral facet arthropathy. No significant foraminal stenosis. IMPRESSION: 1. L4 vertebral body compression fracture with approximately 60% central height loss which has progressed compared with prior CT abdomen dated 08/01/2015. This likely reflects acute fracture superimposed on a chronic compression fracture. 2. Chronic thoracolumbar compression fractures as described above. 3. Mild lumbar spine spondylosis as described above. 4.  Aortic Atherosclerosis (ICD10-I70.0). Electronically Signed   By: Elige Ko   On: 09/30/2017 17:30   Mr Brain Wo Contrast  Result Date: 09/30/2017 CLINICAL DATA:  Back  and RIGHT leg pain since before Christmas. Now unable to walk due to weakness, and frequent falls. Family reports slurred speech. Possible stroke. EXAM: MRI HEAD WITHOUT CONTRAST TECHNIQUE: Multiplanar, multiecho  pulse sequences of the brain and surrounding structures were obtained without intravenous contrast. COMPARISON:  MRI brain 05/11/2016. MRI lumbar spine reported separately. FINDINGS: Brain: No evidence for acute infarction, acute hemorrhage, mass lesion, hydrocephalus, or extra-axial fluid. Generalized atrophy. Chronic microvascular ischemic change. Partial empty sella. Vascular: Flow voids are maintained. Small cavernous carotid aneurysm on the RIGHT better seen on MRA. Tiny microbleeds in the cerebral hemispheres, RIGHT greater than LEFT stable from priors, likely sequelae of hypertensive cerebrovascular disease. Skull and upper cervical spine: Normal marrow signal. Sinuses/Orbits: Negative. Other: None. IMPRESSION: No acute intracranial findings. Atrophy and small vessel disease. Likely sequelae of hypertensive cerebrovascular disease, stable from priors. Lumbar spine MRI reported separately. Electronically Signed   By: Elsie Stain M.D.   On: 09/30/2017 15:47   Mr Lumbar Spine Wo Contrast  Result Date: 09/30/2017 CLINICAL DATA:  Low back pain radiating into the right leg since 09/18/2017. Inability to walk due to weakness. Frequent falls. EXAM: MRI LUMBAR SPINE WITHOUT CONTRAST TECHNIQUE: Multiplanar, multisequence MR imaging of the lumbar spine was performed. No intravenous contrast was administered. COMPARISON:  Lumbar spine radiographs 09/30/2017. CT abdomen and pelvis 08/01/2015. Lumbar spine MRI 03/31/2005. Chest CT 07/19/2017. FINDINGS: Segmentation: Comparison chest CT demonstrates absent ribs at T12. The lowest fully formed intervertebral disc space is L5-S1, and the right L5 transverse process is enlarged and articulates with the sacrum. Alignment:  Trace retrolisthesis of L1 on L2. Vertebrae: Chronic T11 superior and L1 inferior endplate compression fractures with mild-to-moderate vertebral body height loss. An L4 compression fracture has progressed from the prior abdominal CT and now involves the  inferior endplate as well. There is moderate marrow edema in the L4 vertebral body, and there is 75% vertebral body height loss centrally. A fluid-filled cleft is noted along the L4 inferior endplate. There is no L4 retropulsion. A left-sided L5 superior endplate compression deformity is unchanged from the prior abdominal CT. No suspicious osseous lesion is identified. Conus medullaris and cauda equina: Conus extends to the T12-L1 level. Conus and cauda equina appear normal. Paraspinal and other soft tissues: T2 hyperintense lesions in both kidneys measuring up to 2.6 cm in size, likely cysts but incompletely evaluated. Abdominal aortic atherosclerosis with similar appearance of slight infrarenal ectasia. Disc levels: Disc desiccation throughout the lumbar and included lower thoracic spine. Mild disc space narrowing at T11-12. Preserved lumbar disc space heights. T11-12: Small central disc protrusion is stable to minimally larger than on the prior MRI. No stenosis or spinal cord mass effect. T12-L1: Mild disc bulging and new or larger shallow central disc protrusion without stenosis. L1-2:  New mild disc bulging without stenosis. L2-3:  Negative. L3-4:  New minimal disc bulging without stenosis. L4-5: New minimal disc bulging and mild mildly progressive facet hypertrophy without stenosis. L5-S1:  Mild facet arthrosis without disc herniation or stenosis. IMPRESSION: 1. L4 compression fracture with progressive, now severe central height loss since 08/01/2015 including new inferior endplate involvement. Moderate marrow edema. No retropulsion. 2. Multiple chronic lumbar and lower thoracic compression fractures. 3. Mild lumbar spondylosis and facet arthrosis without stenosis. Electronically Signed   By: Sebastian Ache M.D.   On: 09/30/2017 15:59    Scheduled Meds: . aspirin EC  81 mg Oral Daily  . atorvastatin  40 mg Oral Daily  . benazepril  10 mg  Oral Daily  . calcium-vitamin D  1 tablet Oral Daily  . heparin   5,000 Units Subcutaneous Q8H  . levothyroxine  25 mcg Oral QAC breakfast  . sertraline  100 mg Oral Daily  . sodium chloride flush  3 mL Intravenous Q12H  . traZODone  150 mg Oral QHS   Continuous Infusions: . sodium chloride       LOS: 0 days    Time spent: > 35 minutes  Penny Pia, MD [facility] Pager [phone]  If 7PM-7AM, please contact night-coverage [email or web address] Password [email or web address] 10/01/2017, 3:28 PM

## 2017-10-02 ENCOUNTER — Observation Stay (HOSPITAL_COMMUNITY): Payer: Medicare HMO | Admitting: Certified Registered"

## 2017-10-02 ENCOUNTER — Encounter (HOSPITAL_COMMUNITY)
Admission: EM | Disposition: A | Discharge status: Home / Self Care | Payer: Self-pay | Source: Home / Self Care | Attending: Emergency Medicine

## 2017-10-02 ENCOUNTER — Observation Stay (HOSPITAL_COMMUNITY): Payer: Medicare HMO

## 2017-10-02 ENCOUNTER — Other Ambulatory Visit: Payer: Self-pay | Admitting: Neurosurgery

## 2017-10-02 DIAGNOSIS — Z981 Arthrodesis status: Secondary | ICD-10-CM | POA: Diagnosis not present

## 2017-10-02 DIAGNOSIS — I739 Peripheral vascular disease, unspecified: Secondary | ICD-10-CM | POA: Diagnosis not present

## 2017-10-02 DIAGNOSIS — M5126 Other intervertebral disc displacement, lumbar region: Secondary | ICD-10-CM | POA: Diagnosis not present

## 2017-10-02 DIAGNOSIS — I10 Essential (primary) hypertension: Secondary | ICD-10-CM | POA: Diagnosis not present

## 2017-10-02 DIAGNOSIS — M5124 Other intervertebral disc displacement, thoracic region: Secondary | ICD-10-CM | POA: Diagnosis not present

## 2017-10-02 DIAGNOSIS — J449 Chronic obstructive pulmonary disease, unspecified: Secondary | ICD-10-CM | POA: Diagnosis not present

## 2017-10-02 DIAGNOSIS — R262 Difficulty in walking, not elsewhere classified: Secondary | ICD-10-CM | POA: Diagnosis not present

## 2017-10-02 DIAGNOSIS — M5125 Other intervertebral disc displacement, thoracolumbar region: Secondary | ICD-10-CM | POA: Diagnosis not present

## 2017-10-02 DIAGNOSIS — M5136 Other intervertebral disc degeneration, lumbar region: Secondary | ICD-10-CM | POA: Diagnosis not present

## 2017-10-02 DIAGNOSIS — M48061 Spinal stenosis, lumbar region without neurogenic claudication: Secondary | ICD-10-CM | POA: Diagnosis not present

## 2017-10-02 DIAGNOSIS — S32040A Wedge compression fracture of fourth lumbar vertebra, initial encounter for closed fracture: Secondary | ICD-10-CM | POA: Diagnosis present

## 2017-10-02 HISTORY — PX: KYPHOPLASTY: SHX5884

## 2017-10-02 SURGERY — KYPHOPLASTY
Anesthesia: General | Site: Spine Lumbar

## 2017-10-02 MED ORDER — ONDANSETRON HCL 4 MG/2ML IJ SOLN
INTRAMUSCULAR | Status: DC | PRN
  Administered 2017-10-02: 4 mg via INTRAVENOUS

## 2017-10-02 MED ORDER — ROCURONIUM BROMIDE 10 MG/ML (PF) SYRINGE
PREFILLED_SYRINGE | INTRAVENOUS | Status: AC
  Filled 2017-10-02: qty 5

## 2017-10-02 MED ORDER — DEXAMETHASONE SODIUM PHOSPHATE 10 MG/ML IJ SOLN
INTRAMUSCULAR | Status: DC | PRN
  Administered 2017-10-02: 5 mg via INTRAVENOUS

## 2017-10-02 MED ORDER — LACTATED RINGERS IV SOLN
INTRAVENOUS | Status: DC
  Administered 2017-10-02: 19:00:00 via INTRAVENOUS

## 2017-10-02 MED ORDER — MEPERIDINE HCL 25 MG/ML IJ SOLN: 6.2500 mg | INTRAMUSCULAR | Status: DC | PRN

## 2017-10-02 MED ORDER — PROPOFOL 10 MG/ML IV BOLUS
INTRAVENOUS | Status: AC
  Filled 2017-10-02: qty 20

## 2017-10-02 MED ORDER — FENTANYL CITRATE (PF) 100 MCG/2ML IJ SOLN
INTRAMUSCULAR | Status: DC | PRN
  Administered 2017-10-02: 100 ug via INTRAVENOUS

## 2017-10-02 MED ORDER — HYDROMORPHONE HCL 1 MG/ML IJ SOLN
INTRAMUSCULAR | Status: AC
  Filled 2017-10-02: qty 1

## 2017-10-02 MED ORDER — CEFAZOLIN SODIUM-DEXTROSE 2-4 GM/100ML-% IV SOLN
2.0000 g | INTRAVENOUS | Status: AC
  Administered 2017-10-02: 2 g via INTRAVENOUS

## 2017-10-02 MED ORDER — CHLORHEXIDINE GLUCONATE CLOTH 2 % EX PADS: 6.0000 | MEDICATED_PAD | Freq: Once | CUTANEOUS | Status: DC

## 2017-10-02 MED ORDER — ONDANSETRON HCL 4 MG/2ML IJ SOLN: 4.0000 mg | Freq: Once | INTRAMUSCULAR | Status: DC | PRN

## 2017-10-02 MED ORDER — LIDOCAINE-EPINEPHRINE 0.5 %-1:200000 IJ SOLN
INTRAMUSCULAR | Status: AC
  Filled 2017-10-02: qty 1

## 2017-10-02 MED ORDER — EPHEDRINE SULFATE 50 MG/ML IJ SOLN
INTRAMUSCULAR | Status: DC | PRN
  Administered 2017-10-02: 10 mg via INTRAVENOUS

## 2017-10-02 MED ORDER — FENTANYL CITRATE (PF) 250 MCG/5ML IJ SOLN
INTRAMUSCULAR | Status: AC
  Filled 2017-10-02: qty 5

## 2017-10-02 MED ORDER — PROPOFOL 10 MG/ML IV BOLUS
INTRAVENOUS | Status: DC | PRN
  Administered 2017-10-02: 120 mg via INTRAVENOUS

## 2017-10-02 MED ORDER — ONDANSETRON HCL 4 MG/2ML IJ SOLN
INTRAMUSCULAR | Status: AC
  Filled 2017-10-02: qty 2

## 2017-10-02 MED ORDER — CEFAZOLIN SODIUM-DEXTROSE 2-4 GM/100ML-% IV SOLN
INTRAVENOUS | Status: AC
  Filled 2017-10-02: qty 100

## 2017-10-02 MED ORDER — SUGAMMADEX SODIUM 200 MG/2ML IV SOLN
INTRAVENOUS | Status: DC | PRN
  Administered 2017-10-02: 150 mg via INTRAVENOUS

## 2017-10-02 MED ORDER — 0.9 % SODIUM CHLORIDE (POUR BTL) OPTIME
TOPICAL | Status: DC | PRN
  Administered 2017-10-02: 1000 mL

## 2017-10-02 MED ORDER — PHENYLEPHRINE HCL 10 MG/ML IJ SOLN
INTRAVENOUS | Status: DC | PRN
  Administered 2017-10-02: 25 ug/min via INTRAVENOUS

## 2017-10-02 MED ORDER — LIDOCAINE 2% (20 MG/ML) 5 ML SYRINGE
INTRAMUSCULAR | Status: DC | PRN
  Administered 2017-10-02: 100 mg via INTRAVENOUS

## 2017-10-02 MED ORDER — IOPAMIDOL (ISOVUE-300) INJECTION 61%
INTRAVENOUS | Status: AC
  Filled 2017-10-02: qty 50

## 2017-10-02 MED ORDER — IOPAMIDOL (ISOVUE-300) INJECTION 61%
INTRAVENOUS | Status: DC | PRN
  Administered 2017-10-02: 50 mL

## 2017-10-02 MED ORDER — ROCURONIUM BROMIDE 10 MG/ML (PF) SYRINGE
PREFILLED_SYRINGE | INTRAVENOUS | Status: DC | PRN
  Administered 2017-10-02: 50 mg via INTRAVENOUS

## 2017-10-02 MED ORDER — LIDOCAINE-EPINEPHRINE 0.5 %-1:200000 IJ SOLN
INTRAMUSCULAR | Status: DC | PRN
  Administered 2017-10-02: 6 mL

## 2017-10-02 MED ORDER — PHENYLEPHRINE 40 MCG/ML (10ML) SYRINGE FOR IV PUSH (FOR BLOOD PRESSURE SUPPORT)
PREFILLED_SYRINGE | INTRAVENOUS | Status: DC | PRN
  Administered 2017-10-02: 40 ug via INTRAVENOUS

## 2017-10-02 MED ORDER — HYDROMORPHONE HCL 1 MG/ML IJ SOLN
0.2500 mg | INTRAMUSCULAR | Status: DC | PRN
  Administered 2017-10-02 (×2): 0.5 mg via INTRAVENOUS

## 2017-10-02 MED ORDER — LIDOCAINE 2% (20 MG/ML) 5 ML SYRINGE
INTRAMUSCULAR | Status: AC
  Filled 2017-10-02: qty 5

## 2017-10-02 MED ORDER — SODIUM CHLORIDE 0.9 % IV SOLN
Freq: Once | INTRAVENOUS | Status: AC
  Administered 2017-10-02: 250 mL via INTRAVENOUS

## 2017-10-02 MED ORDER — POTASSIUM CHLORIDE IN NACL 20-0.9 MEQ/L-% IV SOLN
INTRAVENOUS | Status: DC
  Administered 2017-10-02: 23:00:00 via INTRAVENOUS
  Filled 2017-10-02: qty 1000

## 2017-10-02 SURGICAL SUPPLY — 43 items
ADH SKN CLS APL DERMABOND .7 (GAUZE/BANDAGES/DRESSINGS) ×1
BLADE CLIPPER SURG (BLADE) IMPLANT
BLADE SURG 15 STRL LF DISP TIS (BLADE) ×1 IMPLANT
BLADE SURG 15 STRL SS (BLADE) ×3
CARTRIDGE OIL MAESTRO DRILL (MISCELLANEOUS) ×1 IMPLANT
CEMENT KYPHON C01A KIT/MIXER (Cement) ×2 IMPLANT
DERMABOND ADVANCED (GAUZE/BANDAGES/DRESSINGS) ×2
DERMABOND ADVANCED .7 DNX12 (GAUZE/BANDAGES/DRESSINGS) ×1 IMPLANT
DIFFUSER DRILL AIR PNEUMATIC (MISCELLANEOUS) ×3 IMPLANT
DRAPE C-ARM 42X72 X-RAY (DRAPES) ×3 IMPLANT
DRAPE HALF SHEET 40X57 (DRAPES) ×3 IMPLANT
DRAPE LAPAROTOMY 100X72X124 (DRAPES) ×3 IMPLANT
DRAPE SURG 17X23 STRL (DRAPES) ×3 IMPLANT
DRAPE WARM FLUID 44X44 (DRAPE) ×3 IMPLANT
DURAPREP 26ML APPLICATOR (WOUND CARE) ×3 IMPLANT
GAUZE SPONGE 4X4 16PLY XRAY LF (GAUZE/BANDAGES/DRESSINGS) ×3 IMPLANT
GLOVE BIOGEL PI IND STRL 7.5 (GLOVE) IMPLANT
GLOVE BIOGEL PI INDICATOR 7.5 (GLOVE) ×2
GLOVE ECLIPSE 6.5 STRL STRAW (GLOVE) ×3 IMPLANT
GLOVE EXAM NITRILE LRG STRL (GLOVE) IMPLANT
GLOVE EXAM NITRILE XL STR (GLOVE) IMPLANT
GLOVE EXAM NITRILE XS STR PU (GLOVE) IMPLANT
GLOVE SURG SS PI 7.5 STRL IVOR (GLOVE) ×2 IMPLANT
GOWN STRL REUS W/ TWL LRG LVL3 (GOWN DISPOSABLE) ×2 IMPLANT
GOWN STRL REUS W/ TWL XL LVL3 (GOWN DISPOSABLE) IMPLANT
GOWN STRL REUS W/TWL 2XL LVL3 (GOWN DISPOSABLE) IMPLANT
GOWN STRL REUS W/TWL LRG LVL3 (GOWN DISPOSABLE) ×6
GOWN STRL REUS W/TWL XL LVL3 (GOWN DISPOSABLE)
KIT BASIN OR (CUSTOM PROCEDURE TRAY) ×3 IMPLANT
KIT ROOM TURNOVER OR (KITS) ×3 IMPLANT
NDL HYPO 25X1 1.5 SAFETY (NEEDLE) ×1 IMPLANT
NEEDLE HYPO 25X1 1.5 SAFETY (NEEDLE) ×3 IMPLANT
NS IRRIG 1000ML POUR BTL (IV SOLUTION) ×3 IMPLANT
OIL CARTRIDGE MAESTRO DRILL (MISCELLANEOUS) ×3
PACK SURGICAL SETUP 50X90 (CUSTOM PROCEDURE TRAY) ×3 IMPLANT
PAD ARMBOARD 7.5X6 YLW CONV (MISCELLANEOUS) ×9 IMPLANT
SPECIMEN JAR SMALL (MISCELLANEOUS) ×2 IMPLANT
STAPLER SKIN PROX WIDE 3.9 (STAPLE) ×3 IMPLANT
SUT VIC AB 3-0 SH 8-18 (SUTURE) ×3 IMPLANT
SYR CONTROL 10ML LL (SYRINGE) ×6 IMPLANT
TOWEL GREEN STERILE (TOWEL DISPOSABLE) ×3 IMPLANT
TOWEL GREEN STERILE FF (TOWEL DISPOSABLE) ×3 IMPLANT
TRAY KYPHOPAK 20/3 ONESTEP 1ST (MISCELLANEOUS) ×2 IMPLANT

## 2017-10-02 NOTE — Anesthesia Procedure Notes (Signed)
Procedure Name: Intubation Date/Time: 10/02/2017 7:34 PM Performed by: Oletta Lamas, CRNA Pre-anesthesia Checklist: Patient identified, Emergency Drugs available, Suction available and Patient being monitored Patient Re-evaluated:Patient Re-evaluated prior to induction Oxygen Delivery Method: Circle System Utilized Preoxygenation: Pre-oxygenation with 100% oxygen Induction Type: IV induction Ventilation: Mask ventilation without difficulty Laryngoscope Size: Mac and 3 Grade View: Grade I Tube type: Oral Number of attempts: 1 Airway Equipment and Method: Stylet Placement Confirmation: ETT inserted through vocal cords under direct vision,  positive ETCO2 and breath sounds checked- equal and bilateral Secured at: 22 cm Tube secured with: Tape Dental Injury: Teeth and Oropharynx as per pre-operative assessment

## 2017-10-02 NOTE — Progress Notes (Signed)
Pt transported to OR via bed by OR staff.  Report called to Sam, RN in holding area. Pt's VSS stable.  No increase in pain.  NPO since 1pm except for sip of water with meds.

## 2017-10-02 NOTE — Anesthesia Preprocedure Evaluation (Addendum)
Anesthesia Evaluation  Patient identified by MRN, date of birth, ID band Patient awake    Reviewed: Allergy & Precautions, NPO status , Patient's Chart, lab work & pertinent test results  History of Anesthesia Complications Negative for: history of anesthetic complications  Airway Mallampati: II  TM Distance: >3 FB Neck ROM: Full    Dental  (+) Dental Advisory Given, Edentulous Upper, Upper Dentures   Pulmonary COPD,  COPD inhaler, former smoker,    Pulmonary exam normal breath sounds clear to auscultation       Cardiovascular hypertension, Pt. on medications + Peripheral Vascular Disease  Normal cardiovascular exam Rhythm:Regular Rate:Normal     Neuro/Psych  Headaches, PSYCHIATRIC DISORDERS Depression 10/13/15 MRI Head: IMPRESSION: 1. No acute intracranial abnormality. 2. New, chronic microhemorrhage in the right frontal lobe. 3. Moderate chronic small vessel ischemic disease. Chronic bilateral parietal infarcts. 4. High-grade stenosis of the left cavernous ICA, slightly progressed from the prior MRA. 5. Unchanged, moderate right M1 MCA stenosis. 6. Unchanged 5 mm proximal right cavernous ICA aneurysm. CVA, Residual Symptoms    GI/Hepatic negative GI ROS, Neg liver ROS,   Endo/Other  Obesity   Renal/GU negative Renal ROS     Musculoskeletal  (+) Arthritis , Osteoarthritis,    Abdominal   Peds  Hematology negative hematology ROS (+)   Anesthesia Other Findings Day of surgery medications reviewed with the patient.  Reproductive/Obstetrics                             Anesthesia Physical  Anesthesia Plan  ASA: III  Anesthesia Plan: General   Post-op Pain Management:    Induction: Intravenous, Rapid sequence and Cricoid pressure planned  PONV Risk Score and Plan: 3 and Ondansetron and Treatment may vary due to age or medical condition  Airway Management Planned: Oral  ETT  Additional Equipment:   Intra-op Plan:   Post-operative Plan: Extubation in OR  Informed Consent: I have reviewed the patients History and Physical, chart, labs and discussed the procedure including the risks, benefits and alternatives for the proposed anesthesia with the patient or authorized representative who has indicated his/her understanding and acceptance.   Dental advisory given  Plan Discussed with: CRNA  Anesthesia Plan Comments:        Anesthesia Quick Evaluation

## 2017-10-02 NOTE — Op Note (Signed)
10/02/2017  8:36 PM  PATIENT:  [name] [name]  72 y.o. female with severe back pain and an L4 compression fracture  PRE-OPERATIVE DIAGNOSIS:  LUMBAR 4 COMPRESSION FRACTURE  POST-OPERATIVE DIAGNOSIS:  LUMBAR 4 COMPRESSION FRACTURE  PROCEDURE:  Procedure(s): LUMBAR FOUR KYPHOPLASTY  SURGEON:  Surgeon(s): Coletta Memosabbell, Dahlton Hinde, MD  ANESTHESIA:   general  EBL:  No intake/output data recorded.  BLOOD ADMINISTERED:none  COUNT:per nursing  SPECIMEN:  No Specimen  DICTATION: [name] was taken to the operating room, intubated and placed under a general anesthetic without difficulty. She was positioned prone on the operating room table with all pressure points properly padded. Her back was prepped and draped in a sterile manner. With fluoroscopy I localized the L4 pedicles bilaterally. I injected lidocaine into the entry sites on both the left and right sides. I started by making a stab incision on the Left, then the right side and entering the L4 pedicles with fluoroscopic guidance. Once good position was obtained, I drilled into the  L4 vertebral body. I then placed the kyphoplasty balloon into the L4 vertebra and inflated the balloons. I then inserted 9cc of methylmethacrylate into the vertebral body under fluoroscopic guidance. I achieved a very good fill of the cavities which did communicate, staying within the confines of the vertebral body. I removed the instrumentation from the vertebral body, and the final films looked good. I closed the stab incision with vicryl suture and used Dermabond for a sterile dressing. [name].    PLAN OF CARE: Admit to inpatient   PATIENT DISPOSITION:  PACU - hemodynamically stable.   Delay start of Pharmacological VTE agent (>24hrs) due to surgical blood loss or risk of bleeding:  yes

## 2017-10-02 NOTE — Progress Notes (Signed)
PROGRESS NOTE    [name] [name]  YHC:[id number] DOB: [birth year] DOA: 09/30/2017 PCP: [name], MD    Brief Narrative:   72 y.o. female with medical history significant of presented with 2 or 3 days and worsening discomfort back pain and inability to walk secondary to pain and some reported weakness  Assessment & Plan:      Compression fracture of body of thoracic vertebra [facility]) - Neurosurgery consulted and plan is for kyphoplasty - At this point focusing on pain control - Continue brace recommended by neurosurgery  Active Problems:   Hypertension -  stable continue current regimen    Peripheral vascular disease (HCC) - pt on statin    COPD (chronic obstructive pulmonary disease) (HCC) - compensated currently - continue albuterol    Hypothyroidism (acquired) - Stable continue patient on Synthroid    DVT prophylaxis: Heparin Code Status: Full Family Communication: Discussed directly with patient. Disposition Plan: pending improvement in condition and plans by neurosurgery   Consultants:   Neurosurgery   Procedures: None   Antimicrobials: None   Subjective: No new complaints reported. Still having discomfort but pain medication and ameliorating discomfort  Objective: Vitals:   10/01/17 1731 10/01/17 2017 10/02/17 0448 10/02/17 1300  BP:  (!) 136/52 126/62 (!) 126/58  Pulse:  71 69 68  Resp:  14 16 16   Temp:  99.3 F (37.4 C) 99 F (37.2 C) 98.9 F (37.2 C)  TempSrc:  Oral Oral Oral  SpO2:  96% 99% 98%  Weight: 76 kg (167 lb 8.8 oz)     Height: 5' (1.524 m)       Intake/Output Summary (Last 24 hours) at 10/02/2017 1646 Last data filed at 10/02/2017 1300 Gross per 24 hour  Intake 360 ml  Output 750 ml  Net -390 ml   Filed Weights   10/01/17 1731  Weight: 76 kg (167 lb 8.8 oz)    Examination:  General exam: Appears calm and comfortable, in nad. Respiratory system: Clear to auscultation. Respiratory effort normal. Equal chest  rise.  Cardiovascular system: S1 & S2 heard, RRR. No JVD, murmurs, rubs, gallops or clicks. Gastrointestinal system: Abdomen is nondistended, soft and nontender. No organomegaly or masses felt. Normal bowel sounds heard. Central nervous system: Alert and oriented. No facial asymmetry Extremities: warm, no deformities. Skin: No rashes, lesions or ulcers, on limited exam. Psychiatry:  Mood & affect appropriate.   Data Reviewed: I have personally reviewed following labs and imaging studies  CBC: Recent Labs  Lab 09/30/17 0901 09/30/17 1859 10/01/17 0411  WBC 5.8 4.9 5.9  HGB 12.6 12.4 11.5*  HCT 39.8 39.5 36.7  MCV 92.8 92.7 91.8  PLT 161 151 174   Basic Metabolic Panel: Recent Labs  Lab 09/30/17 0901 09/30/17 1859 10/01/17 0411  NA 139  --  134*  K 4.5  --  4.3  CL 101  --  103  CO2 27  --  25  GLUCOSE 91  --  122*  BUN 14  --  21*  CREATININE 0.99 0.94 1.00  CALCIUM 9.3  --  8.7*   GFR: Estimated Creatinine Clearance: 47 mL/min (by C-G formula based on SCr of 1 mg/dL). Liver Function Tests: No results for input(s): AST, ALT, ALKPHOS, BILITOT, PROT, ALBUMIN in the last 168 hours. No results for input(s): LIPASE, AMYLASE in the last 168 hours. No results for input(s): AMMONIA in the last 168 hours. Coagulation Profile: No results for input(s): INR, PROTIME in the last 168 hours. Cardiac  Enzymes: No results for input(s): CKTOTAL, CKMB, CKMBINDEX, TROPONINI in the last 168 hours. BNP (last 3 results) No results for input(s): PROBNP in the last 8760 hours. HbA1C: No results for input(s): HGBA1C in the last 72 hours. CBG: No results for input(s): GLUCAP in the last 168 hours. Lipid Profile: No results for input(s): CHOL, HDL, LDLCALC, TRIG, CHOLHDL, LDLDIRECT in the last 72 hours. Thyroid Function Tests: No results for input(s): TSH, T4TOTAL, FREET4, T3FREE, THYROIDAB in the last 72 hours. Anemia Panel: No results for input(s): VITAMINB12, FOLATE, FERRITIN, TIBC,  IRON, RETICCTPCT in the last 72 hours. Sepsis Labs: No results for input(s): PROCALCITON, LATICACIDVEN in the last 168 hours.  No results found for this or any previous visit (from the past 240 hour(s)).       Radiology Studies: Ct Lumbar Spine Wo Contrast  Result Date: 09/30/2017 CLINICAL DATA:  Status post fall. Has low back pain with RLE radiculopathy. Patient was not getting better so came to ED. EXAM: CT LUMBAR SPINE WITHOUT CONTRAST TECHNIQUE: Multidetector CT imaging of the lumbar spine was performed without intravenous contrast administration. Multiplanar CT image reconstructions were also generated. COMPARISON:  MRI lumbar spine performed same day CT abdomen 08/01/2015 FINDINGS: Segmentation: 5 lumbar type vertebrae. Alignment: Normal. Vertebrae: L4 vertebral body compression fracture with approximately 60% central height loss and cortical discontinuity along the inferior endplate. Chronic L1 vertebral body compression fracture with 3 mm of retropulsion of the posterior inferior margin of the L1 vertebral body. Mild chronic L5 vertebral body compression fracture with approximately 10% height loss. Chronic partially visualized T11 vertebral body compression fracture with approximately 50% height loss. Generalized osteopenia. Mild osteoarthritis of bilateral sacroiliac joints. Remainder the vertebral body heights are maintained. No aggressive osseous lesion. No discitis or osteomyelitis. Paraspinal and other soft tissues: No acute paraspinal abnormality. Abdominal aortic atherosclerosis. Infrarenal abdominal aortic ectasia. Bilateral renovascular atherosclerosis. Disc levels: Disc spaces: Disc spaces are maintained. T12-L1: Mild broad-based disc bulge with a small shallow central disc protrusion. No foraminal or central canal stenosis. L1-L2: Mild broad-based disc bulge. No foraminal or central canal stenosis. L2-L3: No significant disc protrusion, foraminal stenosis or central canal stenosis.  Mild bilateral facet arthropathy. L3-L4: Mild broad-based disc bulge. Mild bilateral facet arthropathy. No foraminal or central canal stenosis. L4-L5: Mild broad-based disc bulge. Moderate left and mild right facet arthropathy. No foraminal stenosis. L5-S1: No significant disc protrusion. Moderate bilateral facet arthropathy. No significant foraminal stenosis. IMPRESSION: 1. L4 vertebral body compression fracture with approximately 60% central height loss which has progressed compared with prior CT abdomen dated 08/01/2015. This likely reflects acute fracture superimposed on a chronic compression fracture. 2. Chronic thoracolumbar compression fractures as described above. 3. Mild lumbar spine spondylosis as described above. 4.  Aortic Atherosclerosis (ICD10-I70.0). Electronically Signed   By: Elige Ko   On: 09/30/2017 17:30    Scheduled Meds: . aspirin EC  81 mg Oral Daily  . atorvastatin  40 mg Oral Daily  . benazepril  10 mg Oral Daily  . calcium-vitamin D  1 tablet Oral Daily  . heparin  5,000 Units Subcutaneous Q8H  . levothyroxine  25 mcg Oral QAC breakfast  . sertraline  100 mg Oral Daily  . sodium chloride flush  3 mL Intravenous Q12H  . traZODone  150 mg Oral QHS   Continuous Infusions: . sodium chloride       LOS: 0 days    Time spent: > 35 minutes  Penny Pia, MD [facility] Pager [phone]  If 7PM-7AM, please contact night-coverage [email or web address] Password [facility] 10/02/2017, 4:46 PM

## 2017-10-03 ENCOUNTER — Encounter (HOSPITAL_COMMUNITY): Payer: Self-pay | Admitting: Neurosurgery

## 2017-10-03 DIAGNOSIS — S32040A Wedge compression fracture of fourth lumbar vertebra, initial encounter for closed fracture: Secondary | ICD-10-CM | POA: Diagnosis not present

## 2017-10-03 MED ORDER — OXYCODONE HCL 5 MG PO TABS: ORAL_TABLET | ORAL | 0 refills | Status: DC

## 2017-10-03 NOTE — Progress Notes (Signed)
Spoke to Dr Franky Machoabbell, anticipates DC today after patient cleared by PT. CM changed PT order to imminent DC.

## 2017-10-03 NOTE — Care Management Note (Signed)
Case Management Note  Patient Details  Name: [name] McgregorHattie S [name] MRN: [id number] Date of Birth: 1945-11-26  Subjective/Objective:  72 yr old female admitted with a compression fracture, s/p Kyphoplasty.                 Action/Plan: Case manager spoke with patient concerning discharge plan and DME. Choice for Home Health agency was offered, referral called to Sh[facility] Milletan Phillips, [facility] Liaison. Patient says she has all necessary DME, will have family support at discharge.     Expected Discharge Date:   10/03/17               Expected Discharge Plan:  Home w Home Health Services  In-House Referral:     Discharge planning Services  CM Consult  Post Acute Care Choice:  Home Health Choice offered to:  Patient  DME Arranged:  N/A(Has RW,3in1 and a cane) DME Agency:  NA  HH Arranged:  PT HH Agency:  [facility]  Status of Service:  Completed, signed off  If discussed at Long Length of Stay Meetings, dates discussed:    Additional Comments:  Durenda GuthrieBrady, Kaylia Winborne Naomi, RN 10/03/2017, 2:58 PM

## 2017-10-03 NOTE — Progress Notes (Signed)
Patient ID: Elray McgregorHattie S [name], female   DOB: Nov 22, 1945, 72 y.o.   MRN: [id number] BP (!) 107/57 (BP Location: Right Arm)   Pulse 88   Temp 98.1 F (36.7 C) (Oral)   Resp 14   Ht 5' (1.524 m)   Wt 76 kg (167 lb 8.8 oz)   SpO2 97%   BMI 32.72 kg/m  Alert and oriented x4 speech is clear and fluent Moving all extremities well Ok for discharge Pain prescription given to family

## 2017-10-03 NOTE — Telephone Encounter (Signed)
Left message on vm for pt to call back.  Need to notify her [name] [address]

## 2017-10-03 NOTE — Care Management Obs Status (Signed)
MEDICARE OBSERVATION STATUS NOTIFICATION   Patient Details  Name: [name] McgregorHattie S [name] MRN: [id number] Date of Birth: [birth year]   Medicare Observation Status Notification Given:  Yes    Lawerance Sabalebbie Lovis More, RN 10/03/2017, 10:39 AM

## 2017-10-03 NOTE — Transfer of Care (Addendum)
Immediate Anesthesia Transfer of Care Note  Patient: [name] [name]  Procedure(s) Performed: LUMBAR FOUR KYPHOPLASTY (Spine Lumbar)  Patient Location: PACU  Anesthesia Type:General  Level of Consciousness: awake  Airway & Oxygen Therapy: Patient Spontanous Breathing  Post-op Assessment: Report given to RN  Post vital signs: stable  Last Vitals:  Vitals:   10/02/17 2218 10/03/17 0101  BP: (!) 102/52 (!) 104/53  Pulse: 84 83  Resp: 13 14  Temp: (!) 36.3 C 36.5 C  SpO2: 97% 96%    Last Pain:  Vitals:   10/03/17 0101  TempSrc: Oral  PainSc:       Patients Stated Pain Goal: 2 (10/02/17 1137)  Complications: No apparent anesthesia complications

## 2017-10-03 NOTE — Progress Notes (Signed)
PROGRESS NOTE    [name] McgregorHattie S [name]  MVH:[id number]:[id number] DOB: 1945/10/28 DOA: 09/30/2017 PCP: Eustaquio BoydenGutierrez, Javier, MD    Brief Narrative:   72 y.o. female with medical history significant of presented with 2 or 3 days and worsening discomfort back pain and inability to walk secondary to pain and some reported weakness  Pt underwent Kyphoplasty by Neurosurgery  Assessment & Plan:      Compression fracture of body of thoracic vertebra [facility](HCC) - Neurosurgery consulted and pt is s/p kyphoplasty - Will continue pain control - Continue brace recommended by neurosurgery - Once cleared for d/c by neurosurgery will plan on starting discharge process  Active Problems:   Hypertension -  stable continue current regimen    Peripheral vascular disease (HCC) - pt on statin    COPD (chronic obstructive pulmonary disease) (HCC) - compensated currently - continue albuterol    Hypothyroidism (acquired) - Stable continue patient on Synthroid    DVT prophylaxis: Heparin Code Status: Full Family Communication: Discussed directly with patient. Disposition Plan: Once neurosurgery clears for discharge given patient had recent operation.   Consultants:   Neurosurgery   Procedures: None   Antimicrobials: None   Subjective: No complaints reported to me on visit.  Objective: Vitals:   10/02/17 2200 10/02/17 2218 10/03/17 0101 10/03/17 0649  BP: (!) 93/35 (!) 102/52 (!) 104/53 (!) 107/57  Pulse: 78 84 83 88  Resp: 13 13 14 14   Temp: 97.8 F (36.6 C) (!) 97.4 F (36.3 C) 97.7 F (36.5 C) 98.1 F (36.7 C)  TempSrc:  Oral Oral Oral  SpO2: 97% 97% 96% 97%  Weight:      Height:        Intake/Output Summary (Last 24 hours) at 10/03/2017 1818 Last data filed at 10/03/2017 1241 Gross per 24 hour  Intake 1240 ml  Output 3500 ml  Net -2260 ml   Filed Weights   10/01/17 1731  Weight: 76 kg (167 lb 8.8 oz)    Examination: Exam unchanged when compared to 10/02/2017  General exam:  Appears calm and comfortable, in nad. Respiratory system: Clear to auscultation. Respiratory effort normal. Equal chest rise.  Cardiovascular system: S1 & S2 heard, RRR. No JVD, murmurs, rubs, gallops or clicks. Gastrointestinal system: Abdomen is nondistended, soft and nontender. No organomegaly or masses felt. Normal bowel sounds heard. Central nervous system: Alert and oriented. No facial asymmetry Extremities: warm, no deformities. Skin: No rashes, lesions or ulcers, on limited exam. Psychiatry:  Mood & affect appropriate.   Data Reviewed: I have personally reviewed following labs and imaging studies  CBC: Recent Labs  Lab 09/30/17 0901 09/30/17 1859 10/01/17 0411  WBC 5.8 4.9 5.9  HGB 12.6 12.4 11.5*  HCT 39.8 39.5 36.7  MCV 92.8 92.7 91.8  PLT 161 151 174   Basic Metabolic Panel: Recent Labs  Lab 09/30/17 0901 09/30/17 1859 10/01/17 0411  NA 139  --  134*  K 4.5  --  4.3  CL 101  --  103  CO2 27  --  25  GLUCOSE 91  --  122*  BUN 14  --  21*  CREATININE 0.99 0.94 1.00  CALCIUM 9.3  --  8.7*   GFR: Estimated Creatinine Clearance: 47 mL/min (by C-G formula based on SCr of 1 mg/dL). Liver Function Tests: No results for input(s): AST, ALT, ALKPHOS, BILITOT, PROT, ALBUMIN in the last 168 hours. No results for input(s): LIPASE, AMYLASE in the last 168 hours. No results for input(s): AMMONIA in the  last 168 hours. Coagulation Profile: No results for input(s): INR, PROTIME in the last 168 hours. Cardiac Enzymes: No results for input(s): CKTOTAL, CKMB, CKMBINDEX, TROPONINI in the last 168 hours. BNP (last 3 results) No results for input(s): PROBNP in the last 8760 hours. HbA1C: No results for input(s): HGBA1C in the last 72 hours. CBG: No results for input(s): GLUCAP in the last 168 hours. Lipid Profile: No results for input(s): CHOL, HDL, LDLCALC, TRIG, CHOLHDL, LDLDIRECT in the last 72 hours. Thyroid Function Tests: No results for input(s): TSH, T4TOTAL,  FREET4, T3FREE, THYROIDAB in the last 72 hours. Anemia Panel: No results for input(s): VITAMINB12, FOLATE, FERRITIN, TIBC, IRON, RETICCTPCT in the last 72 hours. Sepsis Labs: No results for input(s): PROCALCITON, LATICACIDVEN in the last 168 hours.  No results found for this or any previous visit (from the past 240 hour(s)).       Radiology Studies: Dg Lumbar Spine 2-3 Views  Result Date: 10/02/2017 CLINICAL DATA:  L4 kyphoplasty EXAM: DG C-ARM 61-120 MIN; LUMBAR SPINE - 2-3 VIEW COMPARISON:  MRI from 2 days ago FINDINGS: AP and lateral fluoroscopic views of the compressed L4 vertebra showing bilateral cement. The lateral view is oblique, no visualized cement over the canal. IMPRESSION: Fluoroscopy for L4 kyphoplasty. Electronically Signed   By: Marnee Spring M.D.   On: 10/02/2017 22:35   Dg C-arm 1-60 Min  Result Date: 10/02/2017 CLINICAL DATA:  L4 kyphoplasty EXAM: DG C-ARM 61-120 MIN; LUMBAR SPINE - 2-3 VIEW COMPARISON:  MRI from 2 days ago FINDINGS: AP and lateral fluoroscopic views of the compressed L4 vertebra showing bilateral cement. The lateral view is oblique, no visualized cement over the canal. IMPRESSION: Fluoroscopy for L4 kyphoplasty. Electronically Signed   By: Marnee Spring M.D.   On: 10/02/2017 22:35    Scheduled Meds: . aspirin EC  81 mg Oral Daily  . atorvastatin  40 mg Oral Daily  . benazepril  10 mg Oral Daily  . calcium-vitamin D  1 tablet Oral Daily  . heparin  5,000 Units Subcutaneous Q8H  . levothyroxine  25 mcg Oral QAC breakfast  . sertraline  100 mg Oral Daily  . sodium chloride flush  3 mL Intravenous Q12H  . traZODone  150 mg Oral QHS   Continuous Infusions: . sodium chloride    . lactated ringers 10 mL/hr at 10/02/17 1832     LOS: 0 days    Time spent: > 35 minutes  Penny Pia, MD [facility] Pager [phone]  If 7PM-7AM, please contact night-coverage [email or web address] Password [email or web address] 10/03/2017, 6:18 PM

## 2017-10-03 NOTE — Anesthesia Postprocedure Evaluation (Signed)
Anesthesia Post Note  Patient: [name] [name]  Procedure(s) Performed: LUMBAR FOUR KYPHOPLASTY (Spine Lumbar)     Patient location during evaluation: PACU Anesthesia Type: General Level of consciousness: awake and alert Pain management: pain level controlled Vital Signs Assessment: post-procedure vital signs reviewed and stable Respiratory status: spontaneous breathing, nonlabored ventilation, respiratory function stable and patient connected to nasal cannula oxygen Cardiovascular status: blood pressure returned to baseline and stable Postop Assessment: no apparent nausea or vomiting Anesthetic complications: no    Last Vitals:  Vitals:   10/02/17 2218 10/03/17 0101  BP: (!) 102/52 (!) 104/53  Pulse: 84 83  Resp: 13 14  Temp: (!) 36.3 C 36.5 C  SpO2: 97% 96%    Last Pain:  Vitals:   10/03/17 0101  TempSrc: Oral  PainSc:                  [name] DAVID

## 2017-10-03 NOTE — Discharge Instructions (Signed)
Percutaneous Vertebroplasty, Care After These instructions give you information on caring for yourself after your procedure. Your doctor may also give you more specific instructions. Call your doctor if you have any problems or questions after your procedure. Follow these instructions at home:  Take medicine as told by your doctor.  Keep your wound dry and covered for 24 hours or as told by your doctor.  Ask your doctor when you can bathe or shower.  Put an ice pack on your wound. ? Put ice in a plastic bag. ? Place a towel between your skin and the bag. ? Leave the ice on for 15-20 minutes, 3-4 times a day.  Rest in your bed for 24 hours or as told by your doctor.  Return to normal activities as told by your doctor.  Ask your doctor what stretches and exercises you can do.  Do not bend or lift anything heavy as told by your doctor. Contact a doctor if:  Your wound becomes red, puffy (swollen), or tender to the touch.  You are bleeding or leaking fluid from the wound.  You are sick to your stomach (nauseous) or throw up (vomit) for more than 24 hours after the procedure.  Your back pain does not get better.  You have a fever. Get help right away if:  You have bad back pain that comes on suddenly.  You cannot control when you pee (urinate) or poop (bowel movement).  You lose feeling (numbness) or have tingling in your legs or feet, or they become weak.  You have sudden weakness in your arms or legs.  You have shooting pain down your legs.  You have chest pain or a hard time breathing.  You feel dizzy or pass out (faint).  Your vision changes or you cannot talk as you normally do. This information is not intended to replace advice given to you by your health care provider. Make sure you discuss any questions you have with your health care provider. Document Released: 12/07/2009 Document Revised: 02/18/2016 Document Reviewed: 05/21/2013 Elsevier Interactive Patient  Education  [employer]2018 [employer]

## 2017-10-03 NOTE — Evaluation (Signed)
Physical Therapy Evaluation Patient Details Name: [name] [name] MRN: [id number] DOB: 30-Oct-1945 Today's Date: 10/03/2017   History of Present Illness  Pt is a 72 y/o female s/p L4 kyphoplasty secondary to L4 compression fx. PMH includes anxiety, COPD, HTN, PVD, OA, osteoporosis, CVA with residual R hemi paresis.   Clinical Impression  Pt is s/p surgery above with deficits below. PTA, pt was using RW for ambulation and needed assist with ADLs. Upon eval, pt presenting with post op pain, weakness, and slightly decreased balance. Required min to min guard assist with functional mobility with RW, however, tolerated ambulation well. Reports she has assist at home and they normally assist with mobility and ADLs. Pt reports feeling comfortable with mobility for d/c home. Recommending HHOT and HHPT at d/c to increase independence and safety with functional mobility. Will continue to follow acutely to maximize functional mobility independence and safety.     Follow Up Recommendations Home health PT;Supervision/Assistance - 24 hour(HHOT )    Equipment Recommendations  None recommended by PT    Recommendations for Other Services OT consult     Precautions / Restrictions Precautions Precautions: Back Precaution Booklet Issued: Yes (comment) Precaution Comments: Reviewed back precautions  Required Braces or Orthoses: Spinal Brace Spinal Brace: Lumbar corset;Applied in sitting position Restrictions Weight Bearing Restrictions: No      Mobility  Bed Mobility Overal bed mobility: Needs Assistance Bed Mobility: Rolling;Sidelying to Sit;Sit to Sidelying Rolling: Min guard Sidelying to sit: Min guard     Sit to sidelying: Min guard General bed mobility comments: Min guard to ensure log roll technique, however, no external assist required. Required cues for sequencing. Also required extended time and cues for appropriate UE position to maintain precautions.   Transfers Overall transfer  level: Needs assistance Equipment used: Rolling walker (2 wheeled) Transfers: Sit to/from Stand Sit to Stand: Min assist         General transfer comment: Min A for lift assist and steadying assist. Verbal cues to power through LEs. Educated about assist that is needed at home and pt reports family can assist.   Ambulation/Gait Ambulation/Gait assistance: Min guard Ambulation Distance (Feet): 40 Feet Assistive device: Rolling walker (2 wheeled) Gait Pattern/deviations: Step-through pattern;Decreased stride length;Trunk flexed Gait velocity: Decreased Gait velocity interpretation: Below normal speed for age/gender General Gait Details: Slow, overall steady gait with use of RW. Min guard for safety. Educated about generalized walking program to perform at home and importance of having someone with her when she is moving. Pt reports family can assist. Verbal cues for upright posture. Pt reports she only walks household distances at baseline, so is likely close to baseline.   Stairs            Wheelchair Mobility    Modified Rankin (Stroke Patients Only)       Balance Overall balance assessment: Needs assistance Sitting-balance support: No upper extremity supported;Feet supported Sitting balance-Leahy Scale: Good Sitting balance - Comments: Reviewed how to apply brace in sitting.    Standing balance support: Bilateral upper extremity supported;During functional activity Standing balance-Leahy Scale: Poor Standing balance comment: Reliant on BUE support.                              Pertinent Vitals/Pain Pain Assessment: 0-10 Pain Score: 5  Pain Location: back  Pain Descriptors / Indicators: Aching;Operative site guarding Pain Intervention(s): Limited activity within patient's tolerance;Monitored during session;Repositioned    Home Living Family/patient  expects to be discharged to:: Private residence Living Arrangements: Other  relatives(grandchildren) Available Help at Discharge: Family;Available 24 hours/day Type of Home: House Home Access: Stairs to enter Entrance Stairs-Rails: Right Entrance Stairs-Number of Steps: 3 Home Layout: One level Home Equipment: Walker - 2 wheels;Bedside commode;Shower seat Additional Comments: Children will be able to help as well     Prior Function Level of Independence: Needs assistance   Gait / Transfers Assistance Needed: Used RW for mobility but reports she had supervision.   ADL's / Homemaking Assistance Needed: Needed assist with bathing and dressing        Hand Dominance   Dominant Hand: Right    Extremity/Trunk Assessment   Upper Extremity Assessment Upper Extremity Assessment: Generalized weakness    Lower Extremity Assessment Lower Extremity Assessment: Generalized weakness    Cervical / Trunk Assessment Cervical / Trunk Assessment: Other exceptions;Kyphotic Cervical / Trunk Exceptions: s/p kyphoplasty  Communication   Communication: No difficulties  Cognition Arousal/Alertness: Awake/alert Behavior During Therapy: WFL for tasks assessed/performed Overall Cognitive Status: Within Functional Limits for tasks assessed                                        General Comments General comments (skin integrity, edema, etc.): Verbally reviewed stair navigation. Reports family had to assist with stair navigation at baseline. Able to demonstrate good foot clearance when practicing marching.     Exercises     Assessment/Plan    PT Assessment Patient needs continued PT services  PT Problem List Decreased strength;Decreased mobility;Decreased balance;Decreased knowledge of use of DME;Decreased knowledge of precautions;Pain       PT Treatment Interventions DME instruction;Gait training;Stair training;Functional mobility training;Therapeutic activities;Therapeutic exercise;Balance training;Neuromuscular re-education;Patient/family education     PT Goals (Current goals can be found in the Care Plan section)  Acute Rehab PT Goals Patient Stated Goal: to go home today  PT Goal Formulation: With patient Time For Goal Achievement: 10/10/17 Potential to Achieve Goals: Good    Frequency 7X/week   Barriers to discharge        Co-evaluation               AM-PAC PT "6 Clicks" Daily Activity  Outcome Measure Difficulty turning over in bed (including adjusting bedclothes, sheets and blankets)?: A Little Difficulty moving from lying on back to sitting on the side of the bed? : A Little Difficulty sitting down on and standing up from a chair with arms (e.g., wheelchair, bedside commode, etc,.)?: Unable Help needed moving to and from a bed to chair (including a wheelchair)?: A Little Help needed walking in hospital room?: A Little Help needed climbing 3-5 steps with a railing? : A Lot 6 Click Score: 15    End of Session Equipment Utilized During Treatment: Gait belt;Back brace Activity Tolerance: Patient tolerated treatment well Patient left: in bed;with call bell/phone within reach Nurse Communication: Mobility status PT Visit Diagnosis: Other abnormalities of gait and mobility (R26.89);Muscle weakness (generalized) (M62.81);Pain Pain - part of body: (back )    Time: 5956-3875 PT Time Calculation (min) (ACUTE ONLY): 28 min   Charges:   PT Evaluation $PT Eval Low Complexity: 1 Low PT Treatments $Gait Training: 8-22 mins   PT G Codes:        Gladys Damme, PT, DPT  Acute Rehabilitation Services  Pager: [phone]   Lehman Prom 10/03/2017, 4:40 PM

## 2017-10-04 DIAGNOSIS — J432 Centrilobular emphysema: Secondary | ICD-10-CM | POA: Diagnosis not present

## 2017-10-04 DIAGNOSIS — S32040A Wedge compression fracture of fourth lumbar vertebra, initial encounter for closed fracture: Secondary | ICD-10-CM | POA: Diagnosis not present

## 2017-10-04 DIAGNOSIS — I10 Essential (primary) hypertension: Secondary | ICD-10-CM

## 2017-10-04 NOTE — Evaluation (Signed)
Occupational Therapy Evaluation Patient Details Name: [name] [name] MRN: [id number] DOB: January 07, 1946 Today's Date: 10/04/2017    History of Present Illness Pt is a 72 y/o female s/p L4 kyphoplasty secondary to L4 compression fx. PMH includes anxiety, COPD, HTN, PVD, OA, osteoporosis, CVA with residual R hemi paresis.    Clinical Impression   Pt is min A - min guard A with ADL mobility and mod - min A with ADLs. Pt will have assist from family at home and daughter purchased ADL A/E kit for home use. All education completed and no further acute OT is indicated at this time    Follow Up Recommendations  No OT follow up;Supervision - Intermittent    Equipment Recommendations  Other (comment);3 in 1 bedside commode(ADL A/E)    Recommendations for Other Services       Precautions / Restrictions Precautions Precautions: Back Precaution Comments: Reviewed back precautions  Required Braces or Orthoses: Spinal Brace Spinal Brace: Lumbar corset;Applied in sitting position Restrictions Weight Bearing Restrictions: No      Mobility Bed Mobility               General bed mobility comments: pt up in recliner upon arrival  Transfers Overall transfer level: Needs assistance Equipment used: Rolling walker (2 wheeled) Transfers: Sit to/from Stand Sit to Stand: Min assist;Min guard              Balance Overall balance assessment: Needs assistance Sitting-balance support: No upper extremity supported;Feet supported Sitting balance-Leahy Scale: Good     Standing balance support: Bilateral upper extremity supported;During functional activity Standing balance-Leahy Scale: Poor                             ADL either performed or assessed with clinical judgement   ADL Overall ADL's : Needs assistance/impaired Eating/Feeding: Independent;Sitting   Grooming: Wash/dry hands;Wash/dry face;Supervision/safety;Set up;Sitting   Upper Body Bathing: Supervision/  safety;Set up;Sitting;With caregiver independent assisting   Lower Body Bathing: Moderate assistance;With caregiver independent assisting;Sit to/from stand   Upper Body Dressing : Set up;Supervision/safety;With caregiver independent assisting;Sitting   Lower Body Dressing: Moderate assistance;Sit to/from stand;With caregiver independent assisting   Toilet Transfer: Minimal assistance;Min guard;Ambulation;RW;Comfort height toilet;With caregiver independent assisting   Toileting- Clothing Manipulation and Hygiene: Minimal assistance;With caregiver independent assisting;Sit to/from stand   Tub/ Shower Transfer: Minimal assistance;Min guard;Ambulation;Rolling walker;3 in 1;With caregiver independent assisting   Functional mobility during ADLs: Minimal assistance;Min guard;Rolling walker General ADL Comments: pt and her daughter edcuated on ADL A/E for home use with handout provided     Vision Baseline Vision/History: Wears glasses Wears Glasses: Reading only Patient Visual Report: No change from baseline       Perception     Praxis      Pertinent Vitals/Pain Pain Assessment: 0-10 Pain Score: 3  Pain Location: back  Pain Descriptors / Indicators: Aching;Operative site guarding Pain Intervention(s): Limited activity within patient's tolerance;Monitored during session;Premedicated before session;Repositioned     Hand Dominance Right   Extremity/Trunk Assessment Upper Extremity Assessment Upper Extremity Assessment: Generalized weakness   Lower Extremity Assessment Lower Extremity Assessment: Defer to PT evaluation   Cervical / Trunk Assessment Cervical / Trunk Assessment: Other exceptions;Kyphotic Cervical / Trunk Exceptions: s/p kyphoplasty   Communication Communication Communication: No difficulties   Cognition Arousal/Alertness: Awake/alert Behavior During Therapy: WFL for tasks assessed/performed Overall Cognitive Status: Within Functional Limits for tasks  assessed  General Comments       Exercises     Shoulder Instructions      Home Living Family/patient expects to be discharged to:: Private residence Living Arrangements: Other relatives Available Help at Discharge: Family;Available 24 hours/day Type of Home: House Home Access: Stairs to enter [employer] of Steps: 3 Entrance Stairs-Rails: Right Home Layout: One level     Bathroom Shower/Tub: Occupational psychologist: Standard     Home Equipment: Environmental consultant - 2 wheels;Bedside commode;Shower seat   Additional Comments: Children will be able to help as well       Prior Functioning/Environment Level of Independence: Needs assistance  Gait / Transfers Assistance Needed: Used RW for mobility but reports she had supervision.  ADL's / Homemaking Assistance Needed: Needed assist with bathing and dressing            OT Problem List: Decreased activity tolerance;Impaired balance (sitting and/or standing);Decreased knowledge of precautions;Pain;Decreased knowledge of use of DME or AE      OT Treatment/Interventions:      OT Goals(Current goals can be found in the care plan section) Acute Rehab OT Goals Patient Stated Goal: to go home today  OT Goal Formulation: With patient/family  OT Frequency:     Barriers to D/C:    no barriers       Co-evaluation              AM-PAC PT "6 Clicks" Daily Activity     Outcome Measure Help from another person eating meals?: None Help from another person taking care of personal grooming?: None Help from another person toileting, which includes using toliet, bedpan, or urinal?: A Little Help from another person bathing (including washing, rinsing, drying)?: A Lot Help from another person to put on and taking off regular upper body clothing?: None Help from another person to put on and taking off regular lower body clothing?: A Lot 6 Click Score: 19   End of  Session Equipment Utilized During Treatment: Gait belt;Rolling walker;Other (comment)(back brace)  Activity Tolerance: Patient tolerated treatment well Patient left: in chair;with call bell/phone within reach;with family/visitor present  OT Visit Diagnosis: Unsteadiness on feet (R26.81);Muscle weakness (generalized) (M62.81);Pain Pain - part of body: (back)                Time: 1594-7076 OT Time Calculation (min): 26 min Charges:  OT General Charges $OT Visit: 1 Visit OT Evaluation $OT Eval Low Complexity: 1 Low OT Treatments $Self Care/Home Management : 8-22 mins G-Codes: OT G-codes **NOT FOR INPATIENT CLASS** Functional Assessment Tool Used: AM-PAC 6 Clicks Daily Activity     Britt Bottom 10/04/2017, 1:32 PM

## 2017-10-04 NOTE — Discharge Summary (Signed)
Physician Discharge Summary  [name] [name]  WUJ:[id number]  DOB: December 13, 1945  DOA: 09/30/2017 PCP: [name], MD  Admit date: 09/30/2017 Discharge date: 10/04/2017  Admitted From: Home Disposition:  Home   Recommendations for Outpatient Follow-up:  1. Follow up with PCP in 1-2 weeks 2. Please obtain BMP/CBC in one week 3. Follow up with Neurosurgery in 3 weeks   Home Health: PT/ Aide   Discharge Condition: Stable  CODE STATUS: Full  Diet recommendation: Heart Healthy  Brief/Interim Summary: For full details see H&P/progress note but in brief, [name] [name] is a 72 year old female with significant history of hypertension, peripheral vascular disease and COPD who presented to the emergency department with severe back pain and inability to walk upon ED evaluation was found to have a compression fracture of the thoracic vertebra.  Patient underwent kyphoplasty by neurosurgery which alleviate pain.  Patient was evaluated by physical therapy recommended home health PT.  During hospital stay chronic issues remained stable.  Patient was deemed to be stable for discharge and follow-up as an outpatient.  Subjective: Patient seen and examined, report her pain is well controlled much better after surgery.  Denies chest pain, shortness of breath and weakness.  Patient remains afebrile, tolerating diet and ambulating with physical therapy without any issues.  Discharge Diagnoses/Hospital Course:  Active Problems:   Hypertension   Peripheral vascular disease (HCC)   COPD (chronic obstructive pulmonary disease) (HCC)   Dysphagia   Hypothyroidism (acquired)   Compression fracture of body of thoracic vertebra (HCC)   Closed compression fracture of fourth lumbar vertebra (HCC)  Compression fracture of thoracic vertebra Status post kyphoplasty on 10/02/2017  Ambulating with physical therapy, recommending home health PT.  Hypertension BP remained stable during hospital stay Continue  home medications without any changes  Peripheral vascular disease On statin Continue current medication  COPD No no wheezing or shortness of breath Continue home medications  Hypothyroidism Stable continue Synthroid  All other chronic medical condition were stable during the hospitalization.  Patient was seen by physical therapy, recommending home health PT On the day of the discharge the patient's vitals were stable, and no other acute medical condition were reported by patient. the patient was felt safe to be discharge to home  Discharge Instructions  You were cared for by a hospitalist during your hospital stay. If you have any questions about your discharge medications or the care you received while you were in the hospital after you are discharged, you can call the unit and asked to speak with the hospitalist on call if the hospitalist that took care of you is not available. Once you are discharged, your primary care physician will handle any further medical issues. Please note that NO REFILLS for any discharge medications will be authorized once you are discharged, as it is imperative that you return to your primary care physician (or establish a relationship with a primary care physician if you do not have one) for your aftercare needs so that they can reassess your need for medications and monitor your lab values.   Allergies as of 10/04/2017      Reactions   Doxycycline Other (See Comments)   Sig GI upset      Medication List    STOP taking these medications   traMADol 50 MG tablet Commonly known as:  ULTRAM     TAKE these medications   acetaminophen 500 MG tablet Commonly known as:  TYLENOL Take 500 mg by mouth 2 (two) times daily  as needed for mild pain.   albuterol 108 (90 Base) MCG/ACT inhaler Commonly known as:  PROVENTIL HFA;VENTOLIN HFA Inhale 2 puffs into the lungs every 6 (six) hours as needed for wheezing or shortness of breath.   alendronate 70 MG  tablet Commonly known as:  FOSAMAX TAKE 1 TABLET WEEKLY  WITH A FULL GLASS OF WATER ON AN EMPTY STOMACH   aspirin EC 81 MG tablet Take 81 mg by mouth daily.   atorvastatin 40 MG tablet Commonly known as:  LIPITOR TAKE 1 TABLET (40 MG TOTAL) BY MOUTH DAILY.   benazepril 10 MG tablet Commonly known as:  LOTENSIN Take 1 tablet (10 mg total) by mouth daily.   Calcium-Vitamin D 600-400 MG-UNIT Tabs Take 1 tablet by mouth daily.   clopidogrel 75 MG tablet Commonly known as:  PLAVIX TAKE 1 TABLET EVERY DAY   diazepam 5 MG tablet Commonly known as:  VALIUM Take 5 mg by mouth once as needed. In preparation for MRI as directed   fexofenadine 180 MG tablet Commonly known as:  ALLEGRA Take 180 mg by mouth daily as needed for allergies.   levothyroxine 25 MCG tablet Commonly known as:  SYNTHROID, LEVOTHROID Take 1 tablet (25 mcg total) by mouth daily before breakfast.   oxyCODONE 5 MG immediate release tablet Commonly known as:  Oxy IR/ROXICODONE Take one to two tablets as needed for pain control every 6 hours.   sertraline 100 MG tablet Commonly known as:  ZOLOFT Take 1 tablet (100 mg total) by mouth daily.   traZODone 100 MG tablet Commonly known as:  DESYREL Take 1.5 tablets (150 mg total) by mouth at bedtime. for sleep   VITAMIN D PO Take 1 capsule by mouth daily.      Follow-up Information    [facility]-Home Follow up.   Specialty:  Home Health Services Why:  A representative from [facility] will contact you to arrange start date and time for your therapy. Contact information: [address] [place] [address] [phone]        Coletta Memos, MD Follow up in 3 week(s).   Specialty:  Neurosurgery Why:  please call the office to make an appointment Contact information: [address] [place] [address] [phone]          Allergies  Allergen Reactions  . Doxycycline Other (See Comments)    Sig  GI upset    Consultations: Neurosurgery  Procedures/Studies: Dg Lumbar Spine 2-3 Views  Result Date: 10/02/2017 CLINICAL DATA:  L4 kyphoplasty EXAM: DG C-ARM 61-120 MIN; LUMBAR SPINE - 2-3 VIEW COMPARISON:  MRI from 2 days ago FINDINGS: AP and lateral fluoroscopic views of the compressed L4 vertebra showing bilateral cement. The lateral view is oblique, no visualized cement over the canal. IMPRESSION: Fluoroscopy for L4 kyphoplasty. Electronically Signed   By: Marnee Spring M.D.   On: 10/02/2017 22:35   Dg Lumbar Spine Complete  Result Date: 09/30/2017 CLINICAL DATA:  Severe back pain, history of fractured EXAM: LUMBAR SPINE - COMPLETE 4+ VIEW COMPARISON:  CT abdomen and pelvis 08/01/2015 FINDINGS: Diffuse osseous demineralization. Six non-rib-bearing lumbar type vertebra, though prior CT exam is labeled with 5 lumbar vertebra, current exam labeled to match for consistency. Compression fractures are identified at superior endplate L4, inferior endplate L1, and superior endplate T11, appear grossly stable. Mild endplate concavity at superior L5 and inferior L4 noted. Facet degenerative changes lower lumbar spine with mild disc space narrowing at L1-L2.  No new fracture, subluxation or bone destruction. No spondylolysis. SI joints preserved. Scattered atherosclerotic calcifications aorta and iliac arteries. IMPRESSION: Osseous demineralization with mild scattered degenerative disc and facet disease changes. Chronic compression fractures of T11, L1, and L4. No acute abnormalities. Electronically Signed   By: Ulyses Southward M.D.   On: 09/30/2017 12:47   Ct Lumbar Spine Wo Contrast  Result Date: 09/30/2017 CLINICAL DATA:  Status post fall. Has low back pain with RLE radiculopathy. Patient was not getting better so came to ED. EXAM: CT LUMBAR SPINE WITHOUT CONTRAST TECHNIQUE: Multidetector CT imaging of the lumbar spine was performed without intravenous contrast administration. Multiplanar CT image  reconstructions were also generated. COMPARISON:  MRI lumbar spine performed same day CT abdomen 08/01/2015 FINDINGS: Segmentation: 5 lumbar type vertebrae. Alignment: Normal. Vertebrae: L4 vertebral body compression fracture with approximately 60% central height loss and cortical discontinuity along the inferior endplate. Chronic L1 vertebral body compression fracture with 3 mm of retropulsion of the posterior inferior margin of the L1 vertebral body. Mild chronic L5 vertebral body compression fracture with approximately 10% height loss. Chronic partially visualized T11 vertebral body compression fracture with approximately 50% height loss. Generalized osteopenia. Mild osteoarthritis of bilateral sacroiliac joints. Remainder the vertebral body heights are maintained. No aggressive osseous lesion. No discitis or osteomyelitis. Paraspinal and other soft tissues: No acute paraspinal abnormality. Abdominal aortic atherosclerosis. Infrarenal abdominal aortic ectasia. Bilateral renovascular atherosclerosis. Disc levels: Disc spaces: Disc spaces are maintained. T12-L1: Mild broad-based disc bulge with a small shallow central disc protrusion. No foraminal or central canal stenosis. L1-L2: Mild broad-based disc bulge. No foraminal or central canal stenosis. L2-L3: No significant disc protrusion, foraminal stenosis or central canal stenosis. Mild bilateral facet arthropathy. L3-L4: Mild broad-based disc bulge. Mild bilateral facet arthropathy. No foraminal or central canal stenosis. L4-L5: Mild broad-based disc bulge. Moderate left and mild right facet arthropathy. No foraminal stenosis. L5-S1: No significant disc protrusion. Moderate bilateral facet arthropathy. No significant foraminal stenosis. IMPRESSION: 1. L4 vertebral body compression fracture with approximately 60% central height loss which has progressed compared with prior CT abdomen dated 08/01/2015. This likely reflects acute fracture superimposed on a chronic  compression fracture. 2. Chronic thoracolumbar compression fractures as described above. 3. Mild lumbar spine spondylosis as described above. 4.  Aortic Atherosclerosis (ICD10-I70.0). Electronically Signed   By: Elige Ko   On: 09/30/2017 17:30   Mr Brain Wo Contrast  Result Date: 09/30/2017 CLINICAL DATA:  Back and RIGHT leg pain since before Christmas. Now unable to walk due to weakness, and frequent falls. Family reports slurred speech. Possible stroke. EXAM: MRI HEAD WITHOUT CONTRAST TECHNIQUE: Multiplanar, multiecho pulse sequences of the brain and surrounding structures were obtained without intravenous contrast. COMPARISON:  MRI brain 05/11/2016. MRI lumbar spine reported separately. FINDINGS: Brain: No evidence for acute infarction, acute hemorrhage, mass lesion, hydrocephalus, or extra-axial fluid. Generalized atrophy. Chronic microvascular ischemic change. Partial empty sella. Vascular: Flow voids are maintained. Small cavernous carotid aneurysm on the RIGHT better seen on MRA. Tiny microbleeds in the cerebral hemispheres, RIGHT greater than LEFT stable from priors, likely sequelae of hypertensive cerebrovascular disease. Skull and upper cervical spine: Normal marrow signal. Sinuses/Orbits: Negative. Other: None. IMPRESSION: No acute intracranial findings. Atrophy and small vessel disease. Likely sequelae of hypertensive cerebrovascular disease, stable from priors. Lumbar spine MRI reported separately. Electronically Signed   By: Elsie Stain M.D.   On: 09/30/2017 15:47   Mr Lumbar Spine Wo Contrast  Result Date: 09/30/2017 CLINICAL DATA:  Low back  pain radiating into the right leg since 09/18/2017. Inability to walk due to weakness. Frequent falls. EXAM: MRI LUMBAR SPINE WITHOUT CONTRAST TECHNIQUE: Multiplanar, multisequence MR imaging of the lumbar spine was performed. No intravenous contrast was administered. COMPARISON:  Lumbar spine radiographs 09/30/2017. CT abdomen and pelvis 08/01/2015.  Lumbar spine MRI 03/31/2005. Chest CT 07/19/2017. FINDINGS: Segmentation: Comparison chest CT demonstrates absent ribs at T12. The lowest fully formed intervertebral disc space is L5-S1, and the right L5 transverse process is enlarged and articulates with the sacrum. Alignment:  Trace retrolisthesis of L1 on L2. Vertebrae: Chronic T11 superior and L1 inferior endplate compression fractures with mild-to-moderate vertebral body height loss. An L4 compression fracture has progressed from the prior abdominal CT and now involves the inferior endplate as well. There is moderate marrow edema in the L4 vertebral body, and there is 75% vertebral body height loss centrally. A fluid-filled cleft is noted along the L4 inferior endplate. There is no L4 retropulsion. A left-sided L5 superior endplate compression deformity is unchanged from the prior abdominal CT. No suspicious osseous lesion is identified. Conus medullaris and cauda equina: Conus extends to the T12-L1 level. Conus and cauda equina appear normal. Paraspinal and other soft tissues: T2 hyperintense lesions in both kidneys measuring up to 2.6 cm in size, likely cysts but incompletely evaluated. Abdominal aortic atherosclerosis with similar appearance of slight infrarenal ectasia. Disc levels: Disc desiccation throughout the lumbar and included lower thoracic spine. Mild disc space narrowing at T11-12. Preserved lumbar disc space heights. T11-12: Small central disc protrusion is stable to minimally larger than on the prior MRI. No stenosis or spinal cord mass effect. T12-L1: Mild disc bulging and new or larger shallow central disc protrusion without stenosis. L1-2:  New mild disc bulging without stenosis. L2-3:  Negative. L3-4:  New minimal disc bulging without stenosis. L4-5: New minimal disc bulging and mild mildly progressive facet hypertrophy without stenosis. L5-S1:  Mild facet arthrosis without disc herniation or stenosis. IMPRESSION: 1. L4 compression fracture  with progressive, now severe central height loss since 08/01/2015 including new inferior endplate involvement. Moderate marrow edema. No retropulsion. 2. Multiple chronic lumbar and lower thoracic compression fractures. 3. Mild lumbar spondylosis and facet arthrosis without stenosis. Electronically Signed   By: Sebastian AcheAllen  Grady M.D.   On: 09/30/2017 15:59   Dg C-arm 1-60 Min  Result Date: 10/02/2017 CLINICAL DATA:  L4 kyphoplasty EXAM: DG C-ARM 61-120 MIN; LUMBAR SPINE - 2-3 VIEW COMPARISON:  MRI from 2 days ago FINDINGS: AP and lateral fluoroscopic views of the compressed L4 vertebra showing bilateral cement. The lateral view is oblique, no visualized cement over the canal. IMPRESSION: Fluoroscopy for L4 kyphoplasty. Electronically Signed   By: Marnee SpringJonathon  Watts M.D.   On: 10/02/2017 22:35   Dg Hip Unilat With Pelvis 2-3 Views Right  Result Date: 09/22/2017 CLINICAL DATA:  Fall with pain EXAM: DG HIP (WITH OR WITHOUT PELVIS) 2-3V RIGHT COMPARISON:  None. FINDINGS: Pubic symphysis and rami appear intact. No fracture or dislocation. Fixating screws in the proximal left femur. SI joints arthritis. Vascular calcifications. IMPRESSION: 1. No acute osseous abnormality 2. Prior surgical fixation of the left femur. Electronically Signed   By: Jasmine PangKim  Fujinaga M.D.   On: 09/22/2017 17:20    Discharge Exam: Vitals:   10/04/17 0419 10/04/17 0820  BP: (!) 156/58 (!) 161/61  Pulse: 79   Resp:    Temp: 98.5 F (36.9 C)   SpO2: 98%    Vitals:   10/03/17 2303 10/04/17 0417 10/04/17  0419 10/04/17 0820  BP: (!) 96/58 (!) 155/61 (!) 156/58 (!) 161/61  Pulse:  75 79   Resp:      Temp:   98.5 F (36.9 C)   TempSrc:   Oral   SpO2:   98%   Weight:      Height:        General: Pt is alert, awake, not in acute distress Cardiovascular: RRR, S1/S2 +, no rubs, no gallops Respiratory: Anterior auscultation, CTA bilaterally, no wheezing, no rhonchi  The results of significant diagnostics from this hospitalization  (including imaging, microbiology, ancillary and laboratory) are listed below for reference.    Microbiology: No results found for this or any previous visit (from the past 240 hour(s)).   Labs: BNP (last 3 results) No results for input(s): BNP in the last 8760 hours. Basic Metabolic Panel: Recent Labs  Lab 09/30/17 0901 09/30/17 1859 10/01/17 0411  NA 139  --  134*  K 4.5  --  4.3  CL 101  --  103  CO2 27  --  25  GLUCOSE 91  --  122*  BUN 14  --  21*  CREATININE 0.99 0.94 1.00  CALCIUM 9.3  --  8.7*   Liver Function Tests: No results for input(s): AST, ALT, ALKPHOS, BILITOT, PROT, ALBUMIN in the last 168 hours. No results for input(s): LIPASE, AMYLASE in the last 168 hours. No results for input(s): AMMONIA in the last 168 hours. CBC: Recent Labs  Lab 09/30/17 0901 09/30/17 1859 10/01/17 0411  WBC 5.8 4.9 5.9  HGB 12.6 12.4 11.5*  HCT 39.8 39.5 36.7  MCV 92.8 92.7 91.8  PLT 161 151 174   Cardiac Enzymes: No results for input(s): CKTOTAL, CKMB, CKMBINDEX, TROPONINI in the last 168 hours. BNP: Invalid input(s): POCBNP CBG: No results for input(s): GLUCAP in the last 168 hours. D-Dimer No results for input(s): DDIMER in the last 72 hours. Hgb A1c No results for input(s): HGBA1C in the last 72 hours. Lipid Profile No results for input(s): CHOL, HDL, LDLCALC, TRIG, CHOLHDL, LDLDIRECT in the last 72 hours. Thyroid function studies No results for input(s): TSH, T4TOTAL, T3FREE, THYROIDAB in the last 72 hours.  Invalid input(s): FREET3 Anemia work up No results for input(s): VITAMINB12, FOLATE, FERRITIN, TIBC, IRON, RETICCTPCT in the last 72 hours. Urinalysis    Component Value Date/Time   COLORURINE YELLOW 09/30/2017 1616   APPEARANCEUR CLEAR 09/30/2017 1616   LABSPEC 1.012 09/30/2017 1616   PHURINE 6.0 09/30/2017 1616   GLUCOSEU NEGATIVE 09/30/2017 1616   HGBUR NEGATIVE 09/30/2017 1616   BILIRUBINUR NEGATIVE 09/30/2017 1616   KETONESUR NEGATIVE 09/30/2017  1616   PROTEINUR NEGATIVE 09/30/2017 1616   UROBILINOGEN 0.2 08/02/2015 1200   NITRITE NEGATIVE 09/30/2017 1616   LEUKOCYTESUR SMALL (A) 09/30/2017 1616   Sepsis Labs Invalid input(s): PROCALCITONIN,  WBC,  LACTICIDVEN Microbiology No results found for this or any previous visit (from the past 240 hour(s)).   Time coordinating discharge: 32 minutes  SIGNED:  Latrelle Dodrill, MD  [facility] 10/04/2017, 10:59 AM  Pager please text page via  [email or web address] Password [email or web address]

## 2017-10-04 NOTE — Progress Notes (Signed)
Physical Therapy Treatment Patient Details Name: [name] [name] MRN: [id number] DOB: Feb 23, 1946 Today's Date: 10/04/2017    History of Present Illness Pt is a 72 y/o female s/p L4 kyphoplasty secondary to L4 compression fx. PMH includes anxiety, COPD, HTN, PVD, OA, osteoporosis, CVA with residual R hemi paresis.     PT Comments    Pt is up to walk from bed with PT and noted her balance with RW is fairly good but clearly debilitated.  Her family lives with her and she is scheduled to go home and will need 24/7 care to succeed.  Her family may not be able to do this but it is the plan.  Continue acute care as pt is in hospital for strengthening and balance.   Follow Up Recommendations  Home health PT;Supervision/Assistance - 24 hour     Equipment Recommendations  None recommended by PT    Recommendations for Other Services OT consult     Precautions / Restrictions Precautions Precautions: Back Precaution Booklet Issued: Yes (comment) Precaution Comments: Reviewed back precautions  Required Braces or Orthoses: Spinal Brace Spinal Brace: Applied in sitting position;Lumbar corset Restrictions Weight Bearing Restrictions: No    Mobility  Bed Mobility Overal bed mobility: Needs Assistance Bed Mobility: Rolling;Sidelying to Sit Rolling: Min guard Sidelying to sit: Min assist       General bed mobility comments: assisted to sit and steady with no bedrail used  Transfers Overall transfer level: Needs assistance Equipment used: Rolling walker (2 wheeled) Transfers: Sit to/from Stand Sit to Stand: Min guard;Min assist         General transfer comment: minor help to power up and steady on walker  Ambulation/Gait Ambulation/Gait assistance: Min guard Ambulation Distance (Feet): 75 Feet Assistive device: Rolling walker (2 wheeled) Gait Pattern/deviations: Step-through pattern;Decreased stride length;Wide base of support Gait velocity: Decreased Gait velocity  interpretation: Below normal speed for age/gender General Gait Details: controlled pace and walked with cues for direction of walker, assisted with belt   Stairs            Wheelchair Mobility    Modified Rankin (Stroke Patients Only)       Balance Overall balance assessment: Needs assistance Sitting-balance support: Feet supported Sitting balance-Leahy Scale: Good Sitting balance - Comments: applied brace hand over hand but daughter was there   Standing balance support: Bilateral upper extremity supported;During functional activity Standing balance-Leahy Scale: Fair Standing balance comment: needs RW for mobility and very good upright standing                            Cognition Arousal/Alertness: Awake/alert Behavior During Therapy: WFL for tasks assessed/performed Overall Cognitive Status: Within Functional Limits for tasks assessed                                        Exercises      General Comments General comments (skin integrity, edema, etc.): reviewed back precautions and pt only knows bending      Pertinent Vitals/Pain Pain Assessment: Faces Pain Score: 3  Faces Pain Scale: Hurts little more Pain Location: back  Pain Descriptors / Indicators: Operative site guarding Pain Intervention(s): Limited activity within patient's tolerance;Monitored during session;Premedicated before session;Repositioned    Home Living Family/patient expects to be discharged to:: Private residence Living Arrangements: Other relatives Available Help at Discharge: Family;Available 24 hours/day Type of  Home: House Home Access: Stairs to enter Entrance Stairs-Rails: Right Home Layout: One level Home Equipment: Environmental consultant - 2 wheels;Bedside commode;Shower seat Additional Comments: Children will be able to help as well     Prior Function Level of Independence: Needs assistance  Gait / Transfers Assistance Needed: Used RW for mobility but reports she  had supervision.  ADL's / Homemaking Assistance Needed: Needed assist with bathing and dressing     PT Goals (current goals can now be found in the care plan section) Acute Rehab PT Goals Patient Stated Goal: home today PT Goal Formulation: With patient/family Progress towards PT goals: Progressing toward goals    Frequency    7X/week      PT Plan Current plan remains appropriate    Co-evaluation              AM-PAC PT "6 Clicks" Daily Activity  Outcome Measure  Difficulty turning over in bed (including adjusting bedclothes, sheets and blankets)?: Unable Difficulty moving from lying on back to sitting on the side of the bed? : Unable Difficulty sitting down on and standing up from a chair with arms (e.g., wheelchair, bedside commode, etc,.)?: Unable Help needed moving to and from a bed to chair (including a wheelchair)?: A Little Help needed walking in hospital room?: A Little Help needed climbing 3-5 steps with a railing? : A Little 6 Click Score: 12    End of Session Equipment Utilized During Treatment: Gait belt;Back brace Activity Tolerance: Patient tolerated treatment well Patient left: in chair;with call bell/phone within reach;with family/visitor present(pillow to maintain upright sitting posture) Nurse Communication: Mobility status PT Visit Diagnosis: Other abnormalities of gait and mobility (R26.89);Muscle weakness (generalized) (M62.81);Pain Pain - part of body: (back)     Time: 1029-1101 PT Time Calculation (min) (ACUTE ONLY): 32 min  Charges:  $Gait Training: 8-22 mins $Therapeutic Activity: 8-22 mins                    G Codes:  Functional Assessment Tool Used: AM-PAC 6 Clicks Basic Mobility     Ivar Drape 10/04/2017, 2:32 PM   Samul Dada, PT MS Acute Rehab Dept. Number: [facility] [id number] and [facility] [phone]

## 2017-10-04 NOTE — Progress Notes (Signed)
Removed IV, provided discharge education/instructions, all questions and concerns addressed, Pt not in distress, discharged home with belongings. 

## 2017-10-05 ENCOUNTER — Telehealth: Payer: Self-pay | Admitting: *Deleted

## 2017-10-05 DIAGNOSIS — J449 Chronic obstructive pulmonary disease, unspecified: Secondary | ICD-10-CM | POA: Diagnosis not present

## 2017-10-05 DIAGNOSIS — I10 Essential (primary) hypertension: Secondary | ICD-10-CM | POA: Diagnosis not present

## 2017-10-05 DIAGNOSIS — M1990 Unspecified osteoarthritis, unspecified site: Secondary | ICD-10-CM | POA: Diagnosis not present

## 2017-10-05 DIAGNOSIS — M810 Age-related osteoporosis without current pathological fracture: Secondary | ICD-10-CM | POA: Diagnosis not present

## 2017-10-05 DIAGNOSIS — S32040D Wedge compression fracture of fourth lumbar vertebra, subsequent encounter for fracture with routine healing: Secondary | ICD-10-CM | POA: Diagnosis not present

## 2017-10-05 DIAGNOSIS — S22000D Wedge compression fracture of unspecified thoracic vertebra, subsequent encounter for fracture with routine healing: Secondary | ICD-10-CM | POA: Diagnosis not present

## 2017-10-05 DIAGNOSIS — I69351 Hemiplegia and hemiparesis following cerebral infarction affecting right dominant side: Secondary | ICD-10-CM | POA: Diagnosis not present

## 2017-10-05 DIAGNOSIS — I739 Peripheral vascular disease, unspecified: Secondary | ICD-10-CM | POA: Diagnosis not present

## 2017-10-05 DIAGNOSIS — R1310 Dysphagia, unspecified: Secondary | ICD-10-CM | POA: Diagnosis not present

## 2017-10-05 NOTE — Telephone Encounter (Signed)
Transition Care Management Follow-up Telephone Call   Date discharged? 01/09/ 2019   How have you been since you were released from the hospital? "doing pretty good."   Do you understand why you were in the hospital? yes   Do you understand the discharge instructions? yes   Where were you discharged to? home   Items Reviewed:  Medications reviewed: yes  Allergies reviewed: yes  Dietary changes reviewed: yes  Referrals reviewed: yes   Functional Questionnaire:  Activities of Daily Living (ADLs):   She states they are independent in the following: pt is independent in all areas  Any transportation issues/concerns?: no; brought by granddaughter   Any patient concerns? no   Confirmed importance and date/time of follow-up visits scheduled yes Provider Appointment booked with Dr Sharen HonesGutierrez 10/11/2017 @ 1600  Confirmed with patient if condition begins to worsen call PCP or go to the ER.  Patient was given the office number and encouraged to call back with question or concerns.  : yes

## 2017-10-10 DIAGNOSIS — I69351 Hemiplegia and hemiparesis following cerebral infarction affecting right dominant side: Secondary | ICD-10-CM | POA: Diagnosis not present

## 2017-10-10 DIAGNOSIS — J449 Chronic obstructive pulmonary disease, unspecified: Secondary | ICD-10-CM | POA: Diagnosis not present

## 2017-10-10 DIAGNOSIS — M1990 Unspecified osteoarthritis, unspecified site: Secondary | ICD-10-CM | POA: Diagnosis not present

## 2017-10-10 DIAGNOSIS — R1310 Dysphagia, unspecified: Secondary | ICD-10-CM | POA: Diagnosis not present

## 2017-10-10 DIAGNOSIS — S22000D Wedge compression fracture of unspecified thoracic vertebra, subsequent encounter for fracture with routine healing: Secondary | ICD-10-CM | POA: Diagnosis not present

## 2017-10-10 DIAGNOSIS — S32040D Wedge compression fracture of fourth lumbar vertebra, subsequent encounter for fracture with routine healing: Secondary | ICD-10-CM | POA: Diagnosis not present

## 2017-10-10 DIAGNOSIS — I739 Peripheral vascular disease, unspecified: Secondary | ICD-10-CM | POA: Diagnosis not present

## 2017-10-10 DIAGNOSIS — M810 Age-related osteoporosis without current pathological fracture: Secondary | ICD-10-CM | POA: Diagnosis not present

## 2017-10-10 DIAGNOSIS — I10 Essential (primary) hypertension: Secondary | ICD-10-CM | POA: Diagnosis not present

## 2017-10-11 ENCOUNTER — Ambulatory Visit (INDEPENDENT_AMBULATORY_CARE_PROVIDER_SITE_OTHER): Payer: Medicare HMO | Admitting: Family Medicine

## 2017-10-11 ENCOUNTER — Encounter: Payer: Self-pay | Admitting: Family Medicine

## 2017-10-11 VITALS — BP 116/64 | HR 60 | Temp 98.3°F | Wt 161.0 lb

## 2017-10-11 DIAGNOSIS — F331 Major depressive disorder, recurrent, moderate: Secondary | ICD-10-CM

## 2017-10-11 DIAGNOSIS — S32040D Wedge compression fracture of fourth lumbar vertebra, subsequent encounter for fracture with routine healing: Secondary | ICD-10-CM

## 2017-10-11 DIAGNOSIS — E039 Hypothyroidism, unspecified: Secondary | ICD-10-CM

## 2017-10-11 DIAGNOSIS — S22000A Wedge compression fracture of unspecified thoracic vertebra, initial encounter for closed fracture: Secondary | ICD-10-CM

## 2017-10-11 DIAGNOSIS — I720 Aneurysm of carotid artery: Secondary | ICD-10-CM | POA: Diagnosis not present

## 2017-10-11 DIAGNOSIS — I10 Essential (primary) hypertension: Secondary | ICD-10-CM

## 2017-10-11 DIAGNOSIS — G4700 Insomnia, unspecified: Secondary | ICD-10-CM | POA: Diagnosis not present

## 2017-10-11 DIAGNOSIS — M8000XD Age-related osteoporosis with current pathological fracture, unspecified site, subsequent encounter for fracture with routine healing: Secondary | ICD-10-CM | POA: Diagnosis not present

## 2017-10-11 DIAGNOSIS — R296 Repeated falls: Secondary | ICD-10-CM | POA: Diagnosis not present

## 2017-10-11 DIAGNOSIS — I69351 Hemiplegia and hemiparesis following cerebral infarction affecting right dominant side: Secondary | ICD-10-CM

## 2017-10-11 MED ORDER — LEVOTHYROXINE SODIUM 25 MCG PO TABS: 25.0000 ug | ORAL_TABLET | Freq: Every day | ORAL | 1 refills | Status: DC

## 2017-10-11 MED ORDER — TRAZODONE HCL 100 MG PO TABS: 100.0000 mg | ORAL_TABLET | Freq: Every day | ORAL | 1 refills | Status: DC

## 2017-10-11 NOTE — Patient Instructions (Addendum)
Continue current medicines but decrease trazodone to 100mg  nightly.  Labs today. We will be in touch with results.   Sleep hygiene checklist: 1. Avoid naps during the day 2. Avoid stimulants such as caffeine and nicotine. Avoid bedtime alcohol (it can speed onset of sleep but the body's metabolism can cause awakenings). 3. All forms of exercise help ensure sound sleep - limit vigorous exercise to morning or late afternoon 4. Avoid food too close to bedtime including chocolate (which contains caffeine) 5. Soak up natural light 6. Establish regular bedtime routine. 7. Associate bed with sleep - avoid TV, computer or phone, reading while in bed. 8. Ensure pleasant, relaxing sleep environment - quiet, dark, cool room.

## 2017-10-11 NOTE — Progress Notes (Signed)
BP 116/64 (BP Location: Left Arm, Patient Position: Sitting, Cuff Size: Normal)   Pulse 60   Temp 98.3 F (36.8 C) (Oral)   Wt 161 lb (73 kg)   SpO2 95%   BMI 31.44 kg/m    CC: hosp f/u visit Subjective:    Patient ID: [name] [name], female    DOB: [birth year], 72 y.o.   MRN: [id number]  HPI: [name] [name] is a 72 y.o. female presenting on 10/11/2017 for Hospitalization Follow-up (Admitted to [facility] on 09/30/17 due to lumbar fx. States she has has problems with low O2 and BP sometimes low. Provided note from PT.)   Here with family member.  Has life alert - doesn't use it.  2 falls around Christmas with worsening back pain. First time was while getting out of bed - leg weakness. Seen at [facility] with pelvic xray - bad experience with this. Noticing progressive R sided weakness which is new.   Recent hospitalization 1/5 for worsening back pain - found to have new L4 compression fracture along with chronic older lumbar and thoracic fractures - s/p kyphoplasty by neurosurgery with good improvement. HHPT recommended - [facility] coming to house. Tramadol was changed to oxycodone for pain control - takes 1 a day, as well as tylenol. She continues alendronate 70mg  weekly.   PT has been worried about ability to collect reliable BP and pulse and O2 sat - low readings recently.  Noting increased leg swelling. No blood loss noted.   Takes trazodone 150mg  nightly for sleep - daughter worried this is overly sedating.  Admit date: 09/30/2017 Discharge date: 10/04/2017 TCM f/u phone call done 10/05/2017  Admitted From: Home Disposition:  Home   Recommendations for Outpatient Follow-up:  1. Follow up with PCP in 1-2 weeks 2. Please obtain BMP/CBC in one week 3. Follow up with Neurosurgery in 3 weeks   Home Health: PT/ Aide   Discharge Condition: Stable  CODE STATUS: Full  Diet recommendation: Heart Healthy  Discharge Diagnoses/Hospital Course:  Active Problems:    Hypertension   Peripheral vascular disease (HCC)   COPD (chronic obstructive pulmonary disease) (HCC)   Dysphagia   Hypothyroidism (acquired)   Compression fracture of body of thoracic vertebra (HCC)   Closed compression fracture of fourth lumbar vertebra (HCC)  Relevant past medical, surgical, family and social history reviewed and updated as indicated. Interim medical history since our last visit reviewed. Allergies and medications reviewed and updated. Outpatient Medications Prior to Visit  Medication Sig Dispense Refill  . acetaminophen (TYLENOL) 500 MG tablet Take 500 mg by mouth 2 (two) times daily as needed for mild pain.     [name] alendronate (FOSAMAX) 70 MG tablet TAKE 1 TABLET WEEKLY  WITH A FULL GLASS OF WATER ON AN EMPTY STOMACH 12 tablet 3  . aspirin EC 81 MG tablet Take 81 mg by mouth daily.    [name] atorvastatin (LIPITOR) 40 MG tablet TAKE 1 TABLET (40 MG TOTAL) BY MOUTH DAILY. 90 tablet 0  . benazepril (LOTENSIN) 10 MG tablet Take 1 tablet (10 mg total) by mouth daily. 90 tablet 3  . Calcium Carb-Cholecalciferol (CALCIUM-VITAMIN D) 600-400 MG-UNIT TABS Take 1 tablet by mouth daily.    . Cholecalciferol (VITAMIN D PO) Take 1 capsule by mouth daily.    . clopidogrel (PLAVIX) 75 MG tablet TAKE 1 TABLET EVERY DAY 90 tablet 0  . fexofenadine (ALLEGRA) 180 MG tablet Take 180 mg by mouth daily as needed for allergies.     [name]  oxyCODONE (OXY IR/ROXICODONE) 5 MG immediate release tablet Take one to two tablets as needed for pain control every 6 hours. 56 tablet 0  . sertraline (ZOLOFT) 100 MG tablet Take 1 tablet (100 mg total) by mouth daily. 90 tablet 3  . diazepam (VALIUM) 5 MG tablet Take 5 mg by mouth once as needed. In preparation for MRI as directed    . levothyroxine (SYNTHROID, LEVOTHROID) 25 MCG tablet Take 1 tablet (25 mcg total) by mouth daily before breakfast. 30 tablet 3  . traZODone (DESYREL) 100 MG tablet Take 1.5 tablets (150 mg total) by mouth at bedtime. for sleep 135 tablet  3  . albuterol (PROVENTIL HFA;VENTOLIN HFA) 108 (90 BASE) MCG/ACT inhaler Inhale 2 puffs into the lungs every 6 (six) hours as needed for wheezing or shortness of breath.      Facility-Administered Medications Prior to Visit  Medication Dose Route Frequency Provider Last Rate Last Dose  . 0.9 %  sodium chloride infusion  500 mL Intravenous Continuous Danis, Starr LakeHenry L III, MD         Per HPI unless specifically indicated in ROS section below Review of Systems     Objective:    BP 116/64 (BP Location: Left Arm, Patient Position: Sitting, Cuff Size: Normal)   Pulse 60   Temp 98.3 F (36.8 C) (Oral)   Wt 161 lb (73 kg)   SpO2 95%   BMI 31.44 kg/m   Wt Readings from Last 3 Encounters:  10/11/17 161 lb (73 kg)  10/01/17 167 lb 8.8 oz (76 kg)  09/22/17 168 lb (76.2 kg)    Physical Exam  Constitutional: She appears well-developed and well-nourished. No distress.  HENT:  Mouth/Throat: Oropharynx is clear and moist. No oropharyngeal exudate.  Eyes: Conjunctivae and EOM are normal. Pupils are equal, round, and reactive to light. No scleral icterus.  Conjunctival pallor  Neck: Normal range of motion. Neck supple.  Cardiovascular: Normal rate, regular rhythm, normal heart sounds and intact distal pulses.  No murmur heard. Pulmonary/Chest: Effort normal and breath sounds normal. No respiratory distress. She has no wheezes. She has no rales.  Musculoskeletal: She exhibits no edema.  Diminished but present pulses DP  Neurological: She is alert.  5/5 strength BLE x mild diminished strength testing hip flexors on right Grip strength intact  Skin: Skin is warm and dry. No rash noted. There is pallor.  Psychiatric: She has a normal mood and affect.  Nursing note and vitals reviewed.  Lab Results  Component Value Date   WBC 5.9 10/01/2017   HGB 11.5 (L) 10/01/2017   HCT 36.7 10/01/2017   MCV 91.8 10/01/2017   PLT 174 10/01/2017    Lab Results  Component Value Date   CREATININE 1.00  10/01/2017   BUN 21 (H) 10/01/2017   NA 134 (L) 10/01/2017   K 4.3 10/01/2017   CL 103 10/01/2017   CO2 25 10/01/2017    Lab Results  Component Value Date   TSH 3.91 09/11/2017       Assessment & Plan:   Problem List Items Addressed This Visit    Carotid artery aneurysm (HCC)    Will refer back to IR per pt request.       Relevant Orders   Ambulatory referral to Interventional Radiology   Closed compression fracture of fourth lumbar vertebra (HCC) - Primary    New - s/p kyphoplasty. Pain seems well managed.       Relevant Orders   CBC with Differential/Platelet  Basic metabolic panel   Compression fracture of body of thoracic vertebra [facility])    old      Hemiparesis affecting right side as late effect of stroke (HCC)    Known h/o this, no worsening deficit noted on clinical exam. Recent MRI reassuringly didn't show new stroke.       Relevant Orders   Ambulatory referral to Interventional Radiology   Hypertension   Relevant Orders   CBC with Differential/Platelet   Basic metabolic panel   Hypothyroidism (acquired)    TSH stable on recheck. Continue low dose levothyroxine.       Relevant Medications   levothyroxine (SYNTHROID, LEVOTHROID) 25 MCG tablet   Insomnia    Chronic, will decrease trazodone to 100mg  daily due to endorsed sedation by family members. Provided with sleep hygiene handout      MDD (major depressive disorder), recurrent episode, moderate (HCC)    Chronic, stable. Continue trazodone, sertraline.       Relevant Medications   traZODone (DESYREL) 100 MG tablet   Osteoporosis    Completed 5 yrs bisphosphonate therapy (2018). prolia was too expensive. bisphosphonate restarted. Notwithstanding, new L4 compression fracture s/p recent kyphoplasty.       Recurrent falls    Will decrease trazodone to 100mg  nightly - daughter endorses oversedation on higher dose.          Follow up plan: Return in about 2 months (around 12/09/2017) for follow  up visit.  [name], MD

## 2017-10-12 DIAGNOSIS — S22000D Wedge compression fracture of unspecified thoracic vertebra, subsequent encounter for fracture with routine healing: Secondary | ICD-10-CM | POA: Diagnosis not present

## 2017-10-12 DIAGNOSIS — J449 Chronic obstructive pulmonary disease, unspecified: Secondary | ICD-10-CM | POA: Diagnosis not present

## 2017-10-12 DIAGNOSIS — I10 Essential (primary) hypertension: Secondary | ICD-10-CM | POA: Diagnosis not present

## 2017-10-12 DIAGNOSIS — M1990 Unspecified osteoarthritis, unspecified site: Secondary | ICD-10-CM | POA: Diagnosis not present

## 2017-10-12 DIAGNOSIS — R1310 Dysphagia, unspecified: Secondary | ICD-10-CM | POA: Diagnosis not present

## 2017-10-12 DIAGNOSIS — M810 Age-related osteoporosis without current pathological fracture: Secondary | ICD-10-CM | POA: Diagnosis not present

## 2017-10-12 DIAGNOSIS — S32040D Wedge compression fracture of fourth lumbar vertebra, subsequent encounter for fracture with routine healing: Secondary | ICD-10-CM | POA: Diagnosis not present

## 2017-10-12 DIAGNOSIS — I739 Peripheral vascular disease, unspecified: Secondary | ICD-10-CM | POA: Diagnosis not present

## 2017-10-12 DIAGNOSIS — I69351 Hemiplegia and hemiparesis following cerebral infarction affecting right dominant side: Secondary | ICD-10-CM | POA: Diagnosis not present

## 2017-10-12 LAB — BASIC METABOLIC PANEL
BUN: 25 mg/dL — ABNORMAL HIGH (ref 6–23)
CO2: 27 mEq/L (ref 19–32)
Calcium: 9.2 mg/dL (ref 8.4–10.5)
Chloride: 101 mEq/L (ref 96–112)
Creatinine, Ser: 1.02 mg/dL (ref 0.40–1.20)
GFR: 56.77 mL/min — ABNORMAL LOW (ref >60.00)
Glucose, Bld: 91 mg/dL (ref 70–99)
Potassium: 4.8 mEq/L (ref 3.5–5.1)
Sodium: 135 mEq/L (ref 135–145)

## 2017-10-12 LAB — CBC WITH DIFFERENTIAL/PLATELET
Basophils Absolute: 0.2 10*3/uL — ABNORMAL HIGH (ref 0.0–0.1)
Basophils Relative: 2.4 % (ref 0.0–3.0)
Eosinophils Absolute: 0.3 10*3/uL (ref 0.0–0.7)
Eosinophils Relative: 3 % (ref 0.0–5.0)
HCT: 37.1 % (ref 36.0–46.0)
Hemoglobin: 12.2 g/dL (ref 12.0–15.0)
Lymphocytes Relative: 24 % (ref 12.0–46.0)
Lymphs Abs: 2.2 10*3/uL (ref 0.7–4.0)
MCHC: 32.8 g/dL (ref 30.0–36.0)
MCV: 91.7 fl (ref 78.0–100.0)
Monocytes Absolute: 1.1 10*3/uL — ABNORMAL HIGH (ref 0.1–1.0)
Monocytes Relative: 12 % (ref 3.0–12.0)
Neutro Abs: 5.4 10*3/uL (ref 1.4–7.7)
Neutrophils Relative %: 58.6 % (ref 43.0–77.0)
Platelets: 226 10*3/uL (ref 150.0–400.0)
RBC: 4.04 Mil/uL (ref 3.87–5.11)
RDW: 13.6 % (ref 11.5–15.5)
WBC: 9.3 10*3/uL (ref 4.0–10.5)

## 2017-10-12 NOTE — Assessment & Plan Note (Signed)
New - s/p kyphoplasty. Pain seems well managed.

## 2017-10-12 NOTE — Assessment & Plan Note (Signed)
Chronic, stable. Continue trazodone, sertraline.

## 2017-10-12 NOTE — Assessment & Plan Note (Signed)
Will refer back to IR per pt request.

## 2017-10-12 NOTE — Assessment & Plan Note (Addendum)
Known h/o this, no worsening deficit noted on clinical exam. Recent MRI reassuringly didn't show new stroke.

## 2017-10-12 NOTE — Assessment & Plan Note (Signed)
TSH stable on recheck. Continue low dose levothyroxine.

## 2017-10-12 NOTE — Assessment & Plan Note (Signed)
.  old

## 2017-10-12 NOTE — Assessment & Plan Note (Signed)
Will decrease trazodone to 100mg  nightly - daughter endorses oversedation on higher dose.

## 2017-10-12 NOTE — Assessment & Plan Note (Addendum)
Completed 5 yrs bisphosphonate therapy (2018). prolia was too expensive. bisphosphonate restarted. Notwithstanding, new L4 compression fracture s/p recent kyphoplasty.

## 2017-10-12 NOTE — Assessment & Plan Note (Signed)
Chronic, will decrease trazodone to 100mg  daily due to endorsed sedation by family members. Provided with sleep hygiene handout

## 2017-10-16 ENCOUNTER — Telehealth (HOSPITAL_COMMUNITY): Payer: Self-pay | Admitting: Radiology

## 2017-10-16 NOTE — Telephone Encounter (Signed)
Called pt, left VM for her to call to schedule appointment with Deveshwar for treatment of her carotid artery aneurysm. JM

## 2017-10-16 NOTE — Addendum Note (Signed)
Addended by: Eustaquio BoydenGUTIERREZ, Zander Ingham on: 10/16/2017 10:32 AM   Modules accepted: Orders

## 2017-10-17 DIAGNOSIS — I739 Peripheral vascular disease, unspecified: Secondary | ICD-10-CM | POA: Diagnosis not present

## 2017-10-17 DIAGNOSIS — I69351 Hemiplegia and hemiparesis following cerebral infarction affecting right dominant side: Secondary | ICD-10-CM | POA: Diagnosis not present

## 2017-10-17 DIAGNOSIS — I10 Essential (primary) hypertension: Secondary | ICD-10-CM | POA: Diagnosis not present

## 2017-10-17 DIAGNOSIS — S22000D Wedge compression fracture of unspecified thoracic vertebra, subsequent encounter for fracture with routine healing: Secondary | ICD-10-CM | POA: Diagnosis not present

## 2017-10-17 DIAGNOSIS — R1310 Dysphagia, unspecified: Secondary | ICD-10-CM | POA: Diagnosis not present

## 2017-10-17 DIAGNOSIS — S32040D Wedge compression fracture of fourth lumbar vertebra, subsequent encounter for fracture with routine healing: Secondary | ICD-10-CM | POA: Diagnosis not present

## 2017-10-17 DIAGNOSIS — M810 Age-related osteoporosis without current pathological fracture: Secondary | ICD-10-CM | POA: Diagnosis not present

## 2017-10-17 DIAGNOSIS — M1990 Unspecified osteoarthritis, unspecified site: Secondary | ICD-10-CM | POA: Diagnosis not present

## 2017-10-17 DIAGNOSIS — J449 Chronic obstructive pulmonary disease, unspecified: Secondary | ICD-10-CM | POA: Diagnosis not present

## 2017-10-19 DIAGNOSIS — I69351 Hemiplegia and hemiparesis following cerebral infarction affecting right dominant side: Secondary | ICD-10-CM | POA: Diagnosis not present

## 2017-10-19 DIAGNOSIS — R1310 Dysphagia, unspecified: Secondary | ICD-10-CM | POA: Diagnosis not present

## 2017-10-19 DIAGNOSIS — M1990 Unspecified osteoarthritis, unspecified site: Secondary | ICD-10-CM | POA: Diagnosis not present

## 2017-10-19 DIAGNOSIS — S22000D Wedge compression fracture of unspecified thoracic vertebra, subsequent encounter for fracture with routine healing: Secondary | ICD-10-CM | POA: Diagnosis not present

## 2017-10-19 DIAGNOSIS — I739 Peripheral vascular disease, unspecified: Secondary | ICD-10-CM | POA: Diagnosis not present

## 2017-10-19 DIAGNOSIS — S32040D Wedge compression fracture of fourth lumbar vertebra, subsequent encounter for fracture with routine healing: Secondary | ICD-10-CM | POA: Diagnosis not present

## 2017-10-19 DIAGNOSIS — J449 Chronic obstructive pulmonary disease, unspecified: Secondary | ICD-10-CM | POA: Diagnosis not present

## 2017-10-19 DIAGNOSIS — M810 Age-related osteoporosis without current pathological fracture: Secondary | ICD-10-CM | POA: Diagnosis not present

## 2017-10-19 DIAGNOSIS — I10 Essential (primary) hypertension: Secondary | ICD-10-CM | POA: Diagnosis not present

## 2017-10-23 DIAGNOSIS — J449 Chronic obstructive pulmonary disease, unspecified: Secondary | ICD-10-CM | POA: Diagnosis not present

## 2017-10-23 DIAGNOSIS — M1990 Unspecified osteoarthritis, unspecified site: Secondary | ICD-10-CM | POA: Diagnosis not present

## 2017-10-23 DIAGNOSIS — R1310 Dysphagia, unspecified: Secondary | ICD-10-CM | POA: Diagnosis not present

## 2017-10-23 DIAGNOSIS — M810 Age-related osteoporosis without current pathological fracture: Secondary | ICD-10-CM | POA: Diagnosis not present

## 2017-10-23 DIAGNOSIS — I739 Peripheral vascular disease, unspecified: Secondary | ICD-10-CM | POA: Diagnosis not present

## 2017-10-23 DIAGNOSIS — I10 Essential (primary) hypertension: Secondary | ICD-10-CM | POA: Diagnosis not present

## 2017-10-23 DIAGNOSIS — I69351 Hemiplegia and hemiparesis following cerebral infarction affecting right dominant side: Secondary | ICD-10-CM | POA: Diagnosis not present

## 2017-10-23 DIAGNOSIS — S22000D Wedge compression fracture of unspecified thoracic vertebra, subsequent encounter for fracture with routine healing: Secondary | ICD-10-CM | POA: Diagnosis not present

## 2017-10-23 DIAGNOSIS — S32040D Wedge compression fracture of fourth lumbar vertebra, subsequent encounter for fracture with routine healing: Secondary | ICD-10-CM | POA: Diagnosis not present

## 2017-10-25 ENCOUNTER — Other Ambulatory Visit: Payer: Self-pay | Admitting: Family Medicine

## 2017-10-25 DIAGNOSIS — I739 Peripheral vascular disease, unspecified: Secondary | ICD-10-CM | POA: Diagnosis not present

## 2017-10-25 DIAGNOSIS — I69351 Hemiplegia and hemiparesis following cerebral infarction affecting right dominant side: Secondary | ICD-10-CM | POA: Diagnosis not present

## 2017-10-25 DIAGNOSIS — R1310 Dysphagia, unspecified: Secondary | ICD-10-CM | POA: Diagnosis not present

## 2017-10-25 DIAGNOSIS — M1990 Unspecified osteoarthritis, unspecified site: Secondary | ICD-10-CM | POA: Diagnosis not present

## 2017-10-25 DIAGNOSIS — S22000D Wedge compression fracture of unspecified thoracic vertebra, subsequent encounter for fracture with routine healing: Secondary | ICD-10-CM | POA: Diagnosis not present

## 2017-10-25 DIAGNOSIS — J449 Chronic obstructive pulmonary disease, unspecified: Secondary | ICD-10-CM | POA: Diagnosis not present

## 2017-10-25 DIAGNOSIS — M810 Age-related osteoporosis without current pathological fracture: Secondary | ICD-10-CM | POA: Diagnosis not present

## 2017-10-25 DIAGNOSIS — I10 Essential (primary) hypertension: Secondary | ICD-10-CM | POA: Diagnosis not present

## 2017-10-25 DIAGNOSIS — S32040D Wedge compression fracture of fourth lumbar vertebra, subsequent encounter for fracture with routine healing: Secondary | ICD-10-CM | POA: Diagnosis not present

## 2017-10-30 DIAGNOSIS — S32040A Wedge compression fracture of fourth lumbar vertebra, initial encounter for closed fracture: Secondary | ICD-10-CM | POA: Diagnosis not present

## 2017-10-30 DIAGNOSIS — Z6830 Body mass index (BMI) 30.0-30.9, adult: Secondary | ICD-10-CM | POA: Diagnosis not present

## 2017-10-30 DIAGNOSIS — M549 Dorsalgia, unspecified: Secondary | ICD-10-CM | POA: Diagnosis not present

## 2017-10-31 DIAGNOSIS — M810 Age-related osteoporosis without current pathological fracture: Secondary | ICD-10-CM | POA: Diagnosis not present

## 2017-10-31 DIAGNOSIS — S22000D Wedge compression fracture of unspecified thoracic vertebra, subsequent encounter for fracture with routine healing: Secondary | ICD-10-CM | POA: Diagnosis not present

## 2017-10-31 DIAGNOSIS — I739 Peripheral vascular disease, unspecified: Secondary | ICD-10-CM | POA: Diagnosis not present

## 2017-10-31 DIAGNOSIS — R1310 Dysphagia, unspecified: Secondary | ICD-10-CM | POA: Diagnosis not present

## 2017-10-31 DIAGNOSIS — M1990 Unspecified osteoarthritis, unspecified site: Secondary | ICD-10-CM | POA: Diagnosis not present

## 2017-10-31 DIAGNOSIS — S32040D Wedge compression fracture of fourth lumbar vertebra, subsequent encounter for fracture with routine healing: Secondary | ICD-10-CM | POA: Diagnosis not present

## 2017-10-31 DIAGNOSIS — I69351 Hemiplegia and hemiparesis following cerebral infarction affecting right dominant side: Secondary | ICD-10-CM | POA: Diagnosis not present

## 2017-10-31 DIAGNOSIS — I10 Essential (primary) hypertension: Secondary | ICD-10-CM | POA: Diagnosis not present

## 2017-10-31 DIAGNOSIS — J449 Chronic obstructive pulmonary disease, unspecified: Secondary | ICD-10-CM | POA: Diagnosis not present

## 2017-11-02 DIAGNOSIS — I69351 Hemiplegia and hemiparesis following cerebral infarction affecting right dominant side: Secondary | ICD-10-CM | POA: Diagnosis not present

## 2017-11-02 DIAGNOSIS — S32040D Wedge compression fracture of fourth lumbar vertebra, subsequent encounter for fracture with routine healing: Secondary | ICD-10-CM | POA: Diagnosis not present

## 2017-11-02 DIAGNOSIS — J449 Chronic obstructive pulmonary disease, unspecified: Secondary | ICD-10-CM | POA: Diagnosis not present

## 2017-11-02 DIAGNOSIS — I739 Peripheral vascular disease, unspecified: Secondary | ICD-10-CM | POA: Diagnosis not present

## 2017-11-02 DIAGNOSIS — S22000D Wedge compression fracture of unspecified thoracic vertebra, subsequent encounter for fracture with routine healing: Secondary | ICD-10-CM | POA: Diagnosis not present

## 2017-11-02 DIAGNOSIS — M1990 Unspecified osteoarthritis, unspecified site: Secondary | ICD-10-CM | POA: Diagnosis not present

## 2017-11-02 DIAGNOSIS — R1310 Dysphagia, unspecified: Secondary | ICD-10-CM | POA: Diagnosis not present

## 2017-11-02 DIAGNOSIS — M810 Age-related osteoporosis without current pathological fracture: Secondary | ICD-10-CM | POA: Diagnosis not present

## 2017-11-02 DIAGNOSIS — I10 Essential (primary) hypertension: Secondary | ICD-10-CM | POA: Diagnosis not present

## 2017-11-07 ENCOUNTER — Telehealth (HOSPITAL_COMMUNITY): Payer: Self-pay | Admitting: Radiology

## 2017-11-07 NOTE — Telephone Encounter (Signed)
Called pt about recent referral back to Dr. Corliss Skainseveshwar. She stated that she got a new referral since she had not been seen since 2017. Will give info to Deveshwar and call pt back. She agrees with this plan. JM

## 2017-11-09 ENCOUNTER — Telehealth: Payer: Self-pay | Admitting: Family Medicine

## 2017-11-09 NOTE — Telephone Encounter (Signed)
Placed form in Dr. G's box.  

## 2017-11-09 NOTE — Telephone Encounter (Signed)
Daughter dropped off parking placard form to be filled out. Placed in RX tower.

## 2017-11-10 NOTE — Telephone Encounter (Signed)
Spoke with pt's daughter, Clayborne Danaatti (on dpr), notifying her pt's form is ready to pick up.    [Made copy of form to be scanned. Placed form at front office.]

## 2017-11-10 NOTE — Telephone Encounter (Signed)
Filled and in Lisa's box 

## 2017-11-13 ENCOUNTER — Other Ambulatory Visit: Payer: Self-pay

## 2017-11-13 ENCOUNTER — Emergency Department (HOSPITAL_COMMUNITY): Payer: Medicare HMO

## 2017-11-13 ENCOUNTER — Encounter (HOSPITAL_COMMUNITY): Payer: Self-pay | Admitting: Emergency Medicine

## 2017-11-13 DIAGNOSIS — E559 Vitamin D deficiency, unspecified: Secondary | ICD-10-CM | POA: Insufficient documentation

## 2017-11-13 DIAGNOSIS — Z8673 Personal history of transient ischemic attack (TIA), and cerebral infarction without residual deficits: Secondary | ICD-10-CM | POA: Diagnosis not present

## 2017-11-13 DIAGNOSIS — R2681 Unsteadiness on feet: Secondary | ICD-10-CM | POA: Diagnosis not present

## 2017-11-13 DIAGNOSIS — Z79899 Other long term (current) drug therapy: Secondary | ICD-10-CM | POA: Insufficient documentation

## 2017-11-13 DIAGNOSIS — I700 Atherosclerosis of aorta: Secondary | ICD-10-CM | POA: Diagnosis not present

## 2017-11-13 DIAGNOSIS — F329 Major depressive disorder, single episode, unspecified: Secondary | ICD-10-CM | POA: Insufficient documentation

## 2017-11-13 DIAGNOSIS — G8191 Hemiplegia, unspecified affecting right dominant side: Secondary | ICD-10-CM | POA: Insufficient documentation

## 2017-11-13 DIAGNOSIS — J9601 Acute respiratory failure with hypoxia: Secondary | ICD-10-CM | POA: Diagnosis not present

## 2017-11-13 DIAGNOSIS — Z87891 Personal history of nicotine dependence: Secondary | ICD-10-CM | POA: Insufficient documentation

## 2017-11-13 DIAGNOSIS — I10 Essential (primary) hypertension: Secondary | ICD-10-CM | POA: Diagnosis not present

## 2017-11-13 DIAGNOSIS — J449 Chronic obstructive pulmonary disease, unspecified: Secondary | ICD-10-CM | POA: Diagnosis not present

## 2017-11-13 DIAGNOSIS — E039 Hypothyroidism, unspecified: Secondary | ICD-10-CM | POA: Insufficient documentation

## 2017-11-13 DIAGNOSIS — Z881 Allergy status to other antibiotic agents status: Secondary | ICD-10-CM | POA: Insufficient documentation

## 2017-11-13 DIAGNOSIS — I739 Peripheral vascular disease, unspecified: Secondary | ICD-10-CM | POA: Diagnosis not present

## 2017-11-13 DIAGNOSIS — R0602 Shortness of breath: Secondary | ICD-10-CM | POA: Diagnosis not present

## 2017-11-13 DIAGNOSIS — R0902 Hypoxemia: Secondary | ICD-10-CM | POA: Diagnosis not present

## 2017-11-13 DIAGNOSIS — F419 Anxiety disorder, unspecified: Secondary | ICD-10-CM | POA: Insufficient documentation

## 2017-11-13 DIAGNOSIS — R0600 Dyspnea, unspecified: Secondary | ICD-10-CM | POA: Diagnosis not present

## 2017-11-13 DIAGNOSIS — E785 Hyperlipidemia, unspecified: Secondary | ICD-10-CM | POA: Diagnosis not present

## 2017-11-13 DIAGNOSIS — Z7982 Long term (current) use of aspirin: Secondary | ICD-10-CM | POA: Insufficient documentation

## 2017-11-13 LAB — BASIC METABOLIC PANEL
Anion gap: 11 (ref 5–15)
BUN: 20 mg/dL (ref 6–20)
CO2: 21 mmol/L — ABNORMAL LOW (ref 22–32)
Calcium: 9.5 mg/dL (ref 8.9–10.3)
Chloride: 104 mmol/L (ref 101–111)
Creatinine, Ser: 1.12 mg/dL — ABNORMAL HIGH (ref 0.44–1.00)
GFR calc Af Amer: 56 mL/min — ABNORMAL LOW (ref >60)
GFR calc non Af Amer: 48 mL/min — ABNORMAL LOW (ref >60)
Glucose, Bld: 100 mg/dL — ABNORMAL HIGH (ref 65–99)
Potassium: 4.1 mmol/L (ref 3.5–5.1)
Sodium: 136 mmol/L (ref 135–145)

## 2017-11-13 LAB — CBC
HCT: 40 % (ref 36.0–46.0)
Hemoglobin: 12.6 g/dL (ref 12.0–15.0)
MCH: 29.3 pg (ref 26.0–34.0)
MCHC: 31.5 g/dL (ref 30.0–36.0)
MCV: 93 fL (ref 78.0–100.0)
Platelets: 203 10*3/uL (ref 150–400)
RBC: 4.3 MIL/uL (ref 3.87–5.11)
RDW: 13.6 % (ref 11.5–15.5)
WBC: 6.9 10*3/uL (ref 4.0–10.5)

## 2017-11-13 LAB — I-STAT TROPONIN, ED: Troponin i, poc: 0 ng/mL (ref 0.00–0.08)

## 2017-11-13 NOTE — ED Notes (Signed)
Family asking about wait time. Explained acuity levels, apologized for delay.

## 2017-11-13 NOTE — ED Triage Notes (Signed)
Pt to ED c/o intermittent SOB and L sided neck pain "for a while" (months) but worse over the last 2 days. Patient has hx carotid stenosis and states this pain has been gradual, but lately she can't do things around the house before getting winded. Resp e/u at rest, skin warm/dry. Denies CP or dizziness.

## 2017-11-13 NOTE — ED Notes (Signed)
Pt. Put on 2 L. Of Oxygen in the lobby

## 2017-11-14 ENCOUNTER — Observation Stay (HOSPITAL_COMMUNITY)
Admission: EM | Admit: 2017-11-14 | Discharge: 2017-11-16 | Disposition: A | Discharge status: Home / Self Care | Payer: Medicare HMO | Attending: Internal Medicine | Admitting: Internal Medicine

## 2017-11-14 ENCOUNTER — Other Ambulatory Visit: Payer: Self-pay

## 2017-11-14 ENCOUNTER — Other Ambulatory Visit (HOSPITAL_COMMUNITY): Payer: Medicare HMO

## 2017-11-14 ENCOUNTER — Encounter (HOSPITAL_COMMUNITY): Payer: Self-pay | Admitting: Internal Medicine

## 2017-11-14 ENCOUNTER — Emergency Department (HOSPITAL_COMMUNITY): Payer: Medicare HMO

## 2017-11-14 ENCOUNTER — Observation Stay (HOSPITAL_BASED_OUTPATIENT_CLINIC_OR_DEPARTMENT_OTHER): Payer: Medicare HMO

## 2017-11-14 DIAGNOSIS — S32040A Wedge compression fracture of fourth lumbar vertebra, initial encounter for closed fracture: Secondary | ICD-10-CM | POA: Diagnosis present

## 2017-11-14 DIAGNOSIS — I10 Essential (primary) hypertension: Secondary | ICD-10-CM

## 2017-11-14 DIAGNOSIS — I739 Peripheral vascular disease, unspecified: Secondary | ICD-10-CM | POA: Diagnosis present

## 2017-11-14 DIAGNOSIS — J9601 Acute respiratory failure with hypoxia: Secondary | ICD-10-CM

## 2017-11-14 DIAGNOSIS — J9611 Chronic respiratory failure with hypoxia: Secondary | ICD-10-CM | POA: Diagnosis present

## 2017-11-14 DIAGNOSIS — I351 Nonrheumatic aortic (valve) insufficiency: Secondary | ICD-10-CM | POA: Diagnosis not present

## 2017-11-14 DIAGNOSIS — R0602 Shortness of breath: Secondary | ICD-10-CM

## 2017-11-14 DIAGNOSIS — R0902 Hypoxemia: Secondary | ICD-10-CM

## 2017-11-14 DIAGNOSIS — J449 Chronic obstructive pulmonary disease, unspecified: Secondary | ICD-10-CM | POA: Diagnosis present

## 2017-11-14 DIAGNOSIS — E039 Hypothyroidism, unspecified: Secondary | ICD-10-CM | POA: Diagnosis present

## 2017-11-14 DIAGNOSIS — I69351 Hemiplegia and hemiparesis following cerebral infarction affecting right dominant side: Secondary | ICD-10-CM

## 2017-11-14 LAB — CBC
HCT: 35.2 % — ABNORMAL LOW (ref 36.0–46.0)
Hemoglobin: 11.1 g/dL — ABNORMAL LOW (ref 12.0–15.0)
MCH: 29.2 pg (ref 26.0–34.0)
MCHC: 31.5 g/dL (ref 30.0–36.0)
MCV: 92.6 fL (ref 78.0–100.0)
Platelets: 172 10*3/uL (ref 150–400)
RBC: 3.8 MIL/uL — ABNORMAL LOW (ref 3.87–5.11)
RDW: 13.5 % (ref 11.5–15.5)
WBC: 6.2 10*3/uL (ref 4.0–10.5)

## 2017-11-14 LAB — ECHOCARDIOGRAM COMPLETE

## 2017-11-14 LAB — LACTIC ACID, PLASMA: Lactic Acid, Venous: 0.5 mmol/L (ref 0.5–1.9)

## 2017-11-14 LAB — TROPONIN I: Troponin I: <0.03 ng/mL (ref <0.03)

## 2017-11-14 LAB — CREATININE, SERUM
Creatinine, Ser: 1 mg/dL (ref 0.44–1.00)
GFR calc Af Amer: >60 mL/min (ref >60)
GFR calc non Af Amer: 55 mL/min — ABNORMAL LOW (ref >60)

## 2017-11-14 LAB — TSH: TSH: 6.191 u[IU]/mL — ABNORMAL HIGH (ref 0.350–4.500)

## 2017-11-14 LAB — T4, FREE: Free T4: 0.87 ng/dL (ref 0.61–1.12)

## 2017-11-14 MED ORDER — ACETAMINOPHEN 650 MG RE SUPP: 650.0000 mg | Freq: Four times a day (QID) | RECTAL | Status: DC | PRN

## 2017-11-14 MED ORDER — ALBUTEROL SULFATE HFA 108 (90 BASE) MCG/ACT IN AERS
2.0000 | INHALATION_SPRAY | Freq: Four times a day (QID) | RESPIRATORY_TRACT | Status: DC | PRN
  Administered 2017-11-14: 2 via RESPIRATORY_TRACT
  Filled 2017-11-14: qty 6.7

## 2017-11-14 MED ORDER — ENOXAPARIN SODIUM 40 MG/0.4ML ~~LOC~~ SOLN
40.0000 mg | Freq: Every day | SUBCUTANEOUS | Status: DC
  Administered 2017-11-14 – 2017-11-16 (×3): 40 mg via SUBCUTANEOUS
  Filled 2017-11-14 (×3): qty 0.4

## 2017-11-14 MED ORDER — TRAZODONE HCL 100 MG PO TABS
100.0000 mg | ORAL_TABLET | Freq: Every day | ORAL | Status: DC
  Administered 2017-11-14 – 2017-11-15 (×2): 100 mg via ORAL
  Filled 2017-11-14 (×2): qty 1

## 2017-11-14 MED ORDER — IOPAMIDOL (ISOVUE-370) INJECTION 76%
INTRAVENOUS | Status: AC
  Administered 2017-11-14: 100 mL
  Filled 2017-11-14: qty 100

## 2017-11-14 MED ORDER — ACETAMINOPHEN 325 MG PO TABS
650.0000 mg | ORAL_TABLET | Freq: Four times a day (QID) | ORAL | Status: DC | PRN
  Administered 2017-11-16: 650 mg via ORAL
  Filled 2017-11-14: qty 2

## 2017-11-14 MED ORDER — SERTRALINE HCL 100 MG PO TABS
100.0000 mg | ORAL_TABLET | Freq: Every day | ORAL | Status: DC
  Administered 2017-11-14 – 2017-11-16 (×3): 100 mg via ORAL
  Filled 2017-11-14 (×3): qty 1

## 2017-11-14 MED ORDER — ASPIRIN EC 81 MG PO TBEC
81.0000 mg | DELAYED_RELEASE_TABLET | Freq: Every day | ORAL | Status: DC
  Administered 2017-11-14 – 2017-11-16 (×3): 81 mg via ORAL
  Filled 2017-11-14 (×3): qty 1

## 2017-11-14 MED ORDER — ONDANSETRON HCL 4 MG/2ML IJ SOLN: 4.0000 mg | Freq: Four times a day (QID) | INTRAMUSCULAR | Status: DC | PRN

## 2017-11-14 MED ORDER — BENAZEPRIL HCL 10 MG PO TABS: 10.0000 mg | ORAL_TABLET | Freq: Every day | ORAL | Status: DC

## 2017-11-14 MED ORDER — HYDRALAZINE HCL 20 MG/ML IJ SOLN: 10.0000 mg | INTRAMUSCULAR | Status: DC | PRN

## 2017-11-14 MED ORDER — ATORVASTATIN CALCIUM 40 MG PO TABS
40.0000 mg | ORAL_TABLET | Freq: Every day | ORAL | Status: DC
  Administered 2017-11-14 – 2017-11-16 (×3): 40 mg via ORAL
  Filled 2017-11-14 (×3): qty 1

## 2017-11-14 MED ORDER — CLOPIDOGREL BISULFATE 75 MG PO TABS
75.0000 mg | ORAL_TABLET | Freq: Every day | ORAL | Status: DC
  Administered 2017-11-14 – 2017-11-16 (×3): 75 mg via ORAL
  Filled 2017-11-14 (×3): qty 1

## 2017-11-14 MED ORDER — AEROCHAMBER PLUS FLO-VU LARGE MISC
Freq: Once | Status: AC
  Administered 2017-11-14: 04:00:00

## 2017-11-14 MED ORDER — FUROSEMIDE 10 MG/ML IJ SOLN
20.0000 mg | Freq: Once | INTRAMUSCULAR | Status: AC
  Administered 2017-11-14: 20 mg via INTRAVENOUS
  Filled 2017-11-14: qty 2

## 2017-11-14 MED ORDER — ONDANSETRON HCL 4 MG PO TABS: 4.0000 mg | ORAL_TABLET | Freq: Four times a day (QID) | ORAL | Status: DC | PRN

## 2017-11-14 MED ORDER — OXYCODONE HCL 5 MG PO TABS
5.0000 mg | ORAL_TABLET | Freq: Four times a day (QID) | ORAL | Status: DC | PRN
  Administered 2017-11-14 – 2017-11-15 (×2): 5 mg via ORAL
  Filled 2017-11-14 (×2): qty 1

## 2017-11-14 MED ORDER — ENSURE ENLIVE PO LIQD
237.0000 mL | Freq: Two times a day (BID) | ORAL | Status: DC
  Administered 2017-11-14 – 2017-11-15 (×2): 237 mL via ORAL

## 2017-11-14 MED ORDER — LEVOTHYROXINE SODIUM 25 MCG PO TABS
25.0000 ug | ORAL_TABLET | Freq: Every day | ORAL | Status: DC
  Administered 2017-11-14 – 2017-11-16 (×3): 25 ug via ORAL
  Filled 2017-11-14 (×3): qty 1

## 2017-11-14 NOTE — H&P (Signed)
History and Physical    [name] [name] DOB: 1945-12-31 DOA: 11/14/2017  PCP: [name], MD  Patient coming from: Home.  Chief Complaint: Shortness of breath.  HPI: [name] [name] is a 72 y.o. female with history of COPD, hypertension, peripheral vascular disease, hypothyroidism and anxiety who had recent kyphoplasty presents to the ER with complaints of worsening shortness of breath on exertion.  Last evening patient also had a brief episode of left shoulder dull aching pain.  Which lasted for only 1 minute.  Patient states since her kyphoplasty patient has been having exertional shortness of breath.  Denies any productive cough fever or chills.  Patient states her blood pressure has been running low normal.  ED Course: In the ER patient was found to easily desaturate on walking.  CT angiogram of the chest shows features concerning for elevated right heart pressure and emphysema.  Given the patient easily desaturates patient has been admitted for further observation.  Review of Systems: As per HPI, rest all negative.   Past Medical History:  Diagnosis Date  . Aneurysm (HCC)   . Anxiety   . Anxiety and depression   . Carotid stenosis    R 50% (12/2012)  . Concussion 08/03/2015  . COPD (chronic obstructive pulmonary disease) (HCC) 12/2012   spirometry: Pre: FVC 84%, FEV1 69%, ratio 0.64 consistent with moderate obstruction.  . Depression   . Fall 08/03/2015   d/c home health 08/2015  . Fracture of cervical vertebra, C5 (HCC) 08/06/2015  . History of chicken pox   . Hyperlipidemia   . Hypertension   . Lower back pain    h/o HNP s/p surgery  . Osteoarthritis    h/o ruptured disc s/p ESI  . Osteoporosis 11/2010   DEXA -2.7 spine, thoracic compression fracture  . Peripheral vascular disease (HCC)   . Smoker    quit 10/2012  . Stroke [facility]) 2010   x3 with residual R hemiparesis, s/p R MCA balloon angioplasty (2010)    Past Surgical History:  Procedure  Laterality Date  . APPENDECTOMY  1960  . CATARACT EXTRACTION     bilateral  . CESAREAN SECTION    . CHOLECYSTECTOMY  1970  . COLONOSCOPY  2004   diverticulosis, no polyps Jarold Motto)  . COLONOSCOPY  05/2016   decreased sphincter tone, diverticulosis, no f/u recommended (Danis)  . DEXA  11/2010   T -2.7 spine, -1.9 hip  . HIP SURGERY Left 2006   fractured - screws placed  . KYPHOPLASTY  10/02/2017   Procedure: LUMBAR FOUR KYPHOPLASTY;  Surgeon: Coletta Memos, MD;  Location: [facility] OR;  Service: Neurosurgery;;  . RADIOLOGY WITH ANESTHESIA N/A 11/11/2015   Procedure: RADIOLOGY WITH ANESTHESIA;  Surgeon: Julieanne Cotton, MD;  Location: [facility] OR;  Service: Radiology;  Laterality: N/A;     reports that she quit smoking about 5 years ago. Her smoking use included cigarettes. She has a 46.00 pack-year smoking history. she has never used smokeless tobacco. She reports that she does not drink alcohol or use drugs.  Allergies  Allergen Reactions  . Doxycycline Other (See Comments)    Sig GI upset    Family History  Problem Relation Age of Onset  . CAD Mother        MI  . Cancer Mother        cervical  . CAD Maternal Aunt        MI  . CAD Sister        MI  .  Sudden death Father 36       died in his sleep, chain smoker  . Cirrhosis Father   . Hypertension Daughter   . Diabetes Maternal Aunt   . Cancer Maternal Grandmother        cervical    Prior to Admission medications   Medication Sig Start Date End Date Taking? Authorizing Provider  acetaminophen (TYLENOL) 500 MG tablet Take 500 mg by mouth 2 (two) times daily as needed for mild pain.     [provider]  alendronate (FOSAMAX) 70 MG tablet TAKE 1 TABLET WEEKLY  WITH A FULL GLASS OF WATER ON AN EMPTY STOMACH 10/11/16   [name], MD  aspirin EC 81 MG tablet Take 81 mg by mouth daily.    [provider]  atorvastatin (LIPITOR) 40 MG tablet TAKE 1 TABLET (40 MG TOTAL) BY MOUTH DAILY. 10/25/17   [name], MD  benazepril (LOTENSIN) 10 MG tablet Take 1 tablet (10 mg total) by mouth daily. 06/28/17   [name], MD  Calcium Carb-Cholecalciferol (CALCIUM-VITAMIN D) 600-400 MG-UNIT TABS Take 1 tablet by mouth daily.    [provider]  Cholecalciferol (VITAMIN D PO) Take 1 capsule by mouth daily.    [provider]  clopidogrel (PLAVIX) 75 MG tablet TAKE 1 TABLET EVERY DAY 10/25/17   [name], MD  fexofenadine (ALLEGRA) 180 MG tablet Take 180 mg by mouth daily as needed for allergies.     [provider]  levothyroxine (SYNTHROID, LEVOTHROID) 25 MCG tablet Take 1 tablet (25 mcg total) by mouth daily before breakfast. 10/11/17   [name], MD  oxyCODONE (OXY IR/ROXICODONE) 5 MG immediate release tablet Take one to two tablets as needed for pain control every 6 hours. 10/03/17   Coletta Memos, MD  sertraline (ZOLOFT) 100 MG tablet Take 1 tablet (100 mg total) by mouth daily. 06/28/17   [name], MD  traZODone (DESYREL) 100 MG tablet Take 1 tablet (100 mg total) by mouth at bedtime. for sleep 10/11/17   [name], MD    Physical Exam: Vitals:   11/13/17 2302 11/14/17 0330 11/14/17 0345 11/14/17 0351  BP: (!) 110/57 (!) 104/41 (!) 114/44   Pulse: 78 (!) 59 65   Resp: 18     Temp: 98.1 F (36.7 C)     TempSrc: Oral     SpO2: 97% 97% 98% 98%      Constitutional: Moderately built and nourished. Vitals:   11/13/17 2302 11/14/17 0330 11/14/17 0345 11/14/17 0351  BP: (!) 110/57 (!) 104/41 (!) 114/44   Pulse: 78 (!) 59 65   Resp: 18     Temp: 98.1 F (36.7 C)     TempSrc: Oral     SpO2: 97% 97% 98% 98%   Eyes: Anicteric no pallor. ENMT: No discharge from the ears eyes nose or mouth. Neck: No mass felt.  No neck rigidity. Respiratory: No rhonchi or crepitations. Cardiovascular: S1-S2 heard no murmurs appreciated. Abdomen: Soft nontender bowel sounds present. Musculoskeletal: No edema.  No joint effusion. Skin: No  rash. Neurologic: Alert awake oriented to time place and person.  Moves all extremities. Psychiatric: Appears normal.  Normal affect.   Labs on Admission: I have personally reviewed following labs and imaging studies  CBC: Recent Labs  Lab 11/13/17 1657  WBC 6.9  HGB 12.6  HCT 40.0  MCV 93.0  PLT 203   Basic Metabolic Panel: Recent Labs  Lab 11/13/17 1657  NA 136  K 4.1  CL 104  CO2 21*  GLUCOSE 100*  BUN 20  CREATININE 1.12*  CALCIUM 9.5   GFR: CrCl cannot be calculated (Unknown ideal weight.). Liver Function Tests: No results for input(s): AST, ALT, ALKPHOS, BILITOT, PROT, ALBUMIN in the last 168 hours. No results for input(s): LIPASE, AMYLASE in the last 168 hours. No results for input(s): AMMONIA in the last 168 hours. Coagulation Profile: No results for input(s): INR, PROTIME in the last 168 hours. Cardiac Enzymes: No results for input(s): CKTOTAL, CKMB, CKMBINDEX, TROPONINI in the last 168 hours. BNP (last 3 results) No results for input(s): PROBNP in the last 8760 hours. HbA1C: No results for input(s): HGBA1C in the last 72 hours. CBG: No results for input(s): GLUCAP in the last 168 hours. Lipid Profile: No results for input(s): CHOL, HDL, LDLCALC, TRIG, CHOLHDL, LDLDIRECT in the last 72 hours. Thyroid Function Tests: No results for input(s): TSH, T4TOTAL, FREET4, T3FREE, THYROIDAB in the last 72 hours. Anemia Panel: No results for input(s): VITAMINB12, FOLATE, FERRITIN, TIBC, IRON, RETICCTPCT in the last 72 hours. Urine analysis:    Component Value Date/Time   COLORURINE YELLOW 09/30/2017 1616   APPEARANCEUR CLEAR 09/30/2017 1616   LABSPEC 1.012 09/30/2017 1616   PHURINE 6.0 09/30/2017 1616   GLUCOSEU NEGATIVE 09/30/2017 1616   HGBUR NEGATIVE 09/30/2017 1616   BILIRUBINUR NEGATIVE 09/30/2017 1616   KETONESUR NEGATIVE 09/30/2017 1616   PROTEINUR NEGATIVE 09/30/2017 1616   UROBILINOGEN 0.2 08/02/2015 1200   NITRITE NEGATIVE 09/30/2017 1616    LEUKOCYTESUR SMALL (A) 09/30/2017 1616   Sepsis Labs: @LABRCNTIP (procalcitonin:4,lacticidven:4) )No results found for this or any previous visit (from the past 240 hour(s)).   Radiological Exams on Admission: Dg Chest 2 View  Result Date: 11/13/2017 CLINICAL DATA:  Intermittent dyspnea left-sided neck pain. EXAM: CHEST  2 VIEW COMPARISON:  Chest CT 07/19/2017, lumbar spine radiographs from 10/30/2017 FINDINGS: The heart size and mediastinal contours are within normal limits. Both lungs are clear. Bibasilar subsegmental atelectasis. No effusion or pneumothorax. Chronic right posterior rib fracture. No acute osseous abnormality. Chronic stable anterior compression of T11 IMPRESSION: No active cardiopulmonary disease. Chronic stable moderate compression of T11 without retropulsion. Electronically Signed   By: Tollie Ethavid  Kwon M.D.   On: 11/13/2017 18:00   Ct Angio Chest Pe W/cm &/or Wo Cm  Result Date: 11/14/2017 CLINICAL DATA:  Intermittent shortness of breath and left-sided neck pain EXAM: CT ANGIOGRAPHY CHEST WITH CONTRAST TECHNIQUE: Multidetector CT imaging of the chest was performed using the standard protocol during bolus administration of intravenous contrast. Multiplanar CT image reconstructions and MIPs were obtained to evaluate the vascular anatomy. CONTRAST:  60 mL Isovue 370 intravenous COMPARISON:  Radiograph 11/13/2017, CT chest 07/19/2017 FINDINGS: Cardiovascular: Satisfactory opacification of the pulmonary arteries to the segmental level. No evidence of pulmonary embolism. Nonaneurysmal aorta. Moderate aortic atherosclerosis. Normal heart size. Coronary artery calcification. No pericardial effusion. Reflux of contrast into the hepatic veins suggesting elevated right heart pressure Mediastinum/Nodes: Midline trachea. No thyroid mass. No significant adenopathy. Esophagus within normal limits. Lungs/Pleura: Mild emphysema. No acute consolidation, pleural effusion or pneumothorax. Upper Abdomen: No  acute abnormality Musculoskeletal: Chronic compression deformity at T11 Review of the MIP images confirms the above findings. IMPRESSION: 1. Negative for acute pulmonary embolus. 2. Minimal emphysema 3. Reflux of contrast into the hepatic veins suggesting elevated right heart pressure. Aortic Atherosclerosis (ICD10-I70.0) and Emphysema (ICD10-J43.9). Electronically Signed   By: Jasmine PangKim  Fujinaga M.D.   On: 11/14/2017 02:43    EKG: Independently reviewed.  Normal sinus rhythm.  Assessment/Plan Principal Problem:   Acute respiratory failure with hypoxia (HCC) Active Problems:   Hypertension   Peripheral vascular disease (HCC)   COPD (chronic obstructive pulmonary disease) (HCC)   Hemiparesis affecting right side as late effect of stroke (HCC)   Hypothyroidism (acquired)   Closed compression fracture of fourth lumbar vertebra (HCC)   SOB (shortness of breath)    1. Exertional dyspnea/acute respiratory failure with hypoxia -given the CAT scan shows elevated right heart pressure will check 2D echo.  Check troponins. 2. Hypertension -since patient's blood pressure is in the low normal and holding of patient's ACE inhibitor and keeping patient on PRN IV hydralazine for now.  Closely follow blood pressure trends. 3. COPD not actively wheezing continue inhalers. 4. Peripheral vascular disease on statins. 5. Hypothyroidism on Synthroid.  Check TSH. 6. History of stroke affecting the right sided hemiparesis. 7. Recent kyphoplasty.   DVT prophylaxis: Lovenox. Code Status: Full code. Family Communication: Discussed with patient. Disposition Plan: Home. Consults called: None. Admission status: Observation.   Eduard Clos MD [facility] Pager [phone].  If 7PM-7AM, please contact night-coverage [email or web address] Password [email or web address]  11/14/2017, 5:25 AM

## 2017-11-14 NOTE — Progress Notes (Signed)
  Echocardiogram 2D Echocardiogram has been performed.  [name] [name] [name] 11/14/2017, 1:50 PM

## 2017-11-14 NOTE — ED Notes (Signed)
Pt asleep and resting at this time. 

## 2017-11-14 NOTE — ED Provider Notes (Signed)
[facility] EMERGENCY DEPARTMENT Provider Note   CSN: [id number] Arrival date & time: 11/13/17  1629  Time seen 12:25 AM    History   Chief Complaint Chief Complaint  Patient presents with  . Shortness of Breath  . Neck Pain    HPI [name] [name] is a 72 y.o. female.  HPI patient is here with her daughter.  Patient was admitted on January 7 for acute compression fracture of L4 and underwent kyphoplasty by Dr. Franky Macho, neurosurgeon.  She they report while she was in the hospital she had low oxygen's, I do not see that commented on in her discharge summary.  She states that she had a follow-up about 2 weeks ago by her primary care doctor.  Evidently her physical therapist was concerned about her low oxygen and sent a letter to him.  However he does not documented in his note how low her oxygen was getting and he does not address it in his final plan.  He saw her on January 16 and documents low pulse ox readings but no number was documented.  Patient states she started getting short of breath yesterday and states she feels short of breath all the time.  She denies cough or fever.  She relates she did have swelling in her legs but that is better now.  The patient does not have a history of heart problems and she denies chest pain.  She complains of pain in her left neck off and on that lasts for a few minutes that she does not relate to any of her activity.  Patient has a history of stroke x3 and has seen Dr. Allayne Stack, interventional radiologist and has carotid blockages and a aneurysm in the brain that he is following.  She also reports strong family history of heart disease in her mother's family including the mother who had a heart attack, and her maternal uncle who had open heart surgery and a maternal aunt who had open heart surgery.  She has a daughter who is had 5 MIs.  Patient has a diagnosis of COPD in her chart and states she was smoking 2 packs a day but she  quit 4-5 years ago.  PCP [name], MD Neurosurgery Dr Franky Macho IR Dr Corliss Skains  Past Medical History:  Diagnosis Date  . Aneurysm (HCC)   . Anxiety   . Anxiety and depression   . Carotid stenosis    R 50% (12/2012)  . Concussion 08/03/2015  . COPD (chronic obstructive pulmonary disease) (HCC) 12/2012   spirometry: Pre: FVC 84%, FEV1 69%, ratio 0.64 consistent with moderate obstruction.  . Depression   . Fall 08/03/2015   d/c home health 08/2015  . Fracture of cervical vertebra, C5 (HCC) 08/06/2015  . History of chicken pox   . Hyperlipidemia   . Hypertension   . Lower back pain    h/o HNP s/p surgery  . Osteoarthritis    h/o ruptured disc s/p ESI  . Osteoporosis 11/2010   DEXA -2.7 spine, thoracic compression fracture  . Peripheral vascular disease (HCC)   . Smoker    quit 10/2012  . Stroke [facility]) 2010   x3 with residual R hemiparesis, s/p R MCA balloon angioplasty (2010)    Patient Active Problem List   Diagnosis Date Noted  . Closed compression fracture of fourth lumbar vertebra (HCC) 10/02/2017  . Compression fracture of body of thoracic vertebra (HCC) 09/30/2017  . Hypothyroidism (acquired) 06/28/2017  . Vitamin D deficiency  06/25/2017  . Dysphagia 03/19/2015  . Advanced care planning/counseling discussion 01/29/2015  . Health maintenance examination 01/29/2015  . Carotid stenosis 04/03/2014  . Carotid artery aneurysm (HCC) 04/03/2014  . Recurrent falls 01/02/2014  . Hemiparesis affecting right side as late effect of stroke (HCC) 01/02/2014  . Insomnia 01/10/2013  . Medicare annual wellness visit, subsequent 11/26/2012  . Hypertension   . History of stroke   . Peripheral vascular disease (HCC)   . COPD (chronic obstructive pulmonary disease) (HCC)   . Hyperlipidemia   . Osteoarthritis   . MDD (major depressive disorder), recurrent episode, moderate (HCC)   . Ex-smoker   . Osteoporosis 11/25/2010    Past Surgical History:  Procedure Laterality Date    . APPENDECTOMY  1960  . CATARACT EXTRACTION     bilateral  . CESAREAN SECTION    . CHOLECYSTECTOMY  1970  . COLONOSCOPY  2004   diverticulosis, no polyps Jarold Motto([place])  . COLONOSCOPY  05/2016   decreased sphincter tone, diverticulosis, no f/u recommended ([place])  . DEXA  11/2010   T -2.7 spine, -1.9 hip  . HIP SURGERY Left 2006   fractured - screws placed  . KYPHOPLASTY  10/02/2017   Procedure: LUMBAR FOUR KYPHOPLASTY;  Surgeon: Coletta Memosabbell, Kyle, MD;  [facility];  Service: Neurosurgery;;  . RADIOLOGY WITH ANESTHESIA N/A 11/11/2015   Procedure: RADIOLOGY WITH ANESTHESIA;  Surgeon: Julieanne CottonSanjeev Deveshwar, MD;  Location: [facility] OR;  Service: Radiology;  Laterality: N/A;    OB History    No data available       Home Medications    Prior to Admission medications   Medication Sig Start Date End Date Taking? Authorizing Provider  acetaminophen (TYLENOL) 500 MG tablet Take 500 mg by mouth 2 (two) times daily as needed for mild pain.     [provider]  alendronate (FOSAMAX) 70 MG tablet TAKE 1 TABLET WEEKLY  WITH A FULL GLASS OF WATER ON AN EMPTY STOMACH 10/11/16   Eustaquio BoydenGutierrez, Javier, MD  aspirin EC 81 MG tablet Take 81 mg by mouth daily.    [provider]  atorvastatin (LIPITOR) 40 MG tablet TAKE 1 TABLET (40 MG TOTAL) BY MOUTH DAILY. 10/25/17   Eustaquio BoydenGutierrez, Javier, MD  benazepril (LOTENSIN) 10 MG tablet Take 1 tablet (10 mg total) by mouth daily. 06/28/17   Eustaquio BoydenGutierrez, Javier, MD  Calcium Carb-Cholecalciferol (CALCIUM-VITAMIN D) 600-400 MG-UNIT TABS Take 1 tablet by mouth daily.    [provider]  Cholecalciferol (VITAMIN D PO) Take 1 capsule by mouth daily.    [provider]  clopidogrel (PLAVIX) 75 MG tablet TAKE 1 TABLET EVERY DAY 10/25/17   Eustaquio BoydenGutierrez, Javier, MD  fexofenadine (ALLEGRA) 180 MG tablet Take 180 mg by mouth daily as needed for allergies.     [provider]  levothyroxine (SYNTHROID, LEVOTHROID) 25 MCG tablet Take 1 tablet (25 mcg total)  by mouth daily before breakfast. 10/11/17   Eustaquio BoydenGutierrez, Javier, MD  oxyCODONE (OXY IR/ROXICODONE) 5 MG immediate release tablet Take one to two tablets as needed for pain control every 6 hours. 10/03/17   Coletta Memosabbell, Kyle, MD  sertraline (ZOLOFT) 100 MG tablet Take 1 tablet (100 mg total) by mouth daily. 06/28/17   Eustaquio BoydenGutierrez, Javier, MD  traZODone (DESYREL) 100 MG tablet Take 1 tablet (100 mg total) by mouth at bedtime. for sleep 10/11/17   Eustaquio BoydenGutierrez, Javier, MD    Family History Family History  Problem Relation Age of Onset  . CAD Mother  MI  . Cancer Mother        cervical  . CAD Maternal Aunt        MI  . CAD Sister        MI  . Sudden death Father 28       died in his sleep, chain smoker  . Cirrhosis Father   . Hypertension Daughter   . Diabetes Maternal Aunt   . Cancer Maternal Grandmother        cervical    Social History Social History   Tobacco Use  . Smoking status: Former Smoker    Packs/day: 1.00    Years: 46.00    Pack years: 46.00    Types: Cigarettes    Last attempt to quit: 11/08/2012    Years since quitting: 5.0  . Smokeless tobacco: Never Used  Substance Use Topics  . Alcohol use: No    Alcohol/week: 0.0 oz  . Drug use: No  lives at home Lies with great great grand children Was smoking 2 ppd, quit 4-5 years ago   Allergies   Doxycycline   Review of Systems Review of Systems  All other systems reviewed and are negative.    Physical Exam Updated Vital Signs BP (!) 110/57 (BP Location: Left Arm)   Pulse 78   Temp 98.1 F (36.7 C) (Oral)   Resp 18   SpO2 97% Comment: 1L [place]  Vital signs normal   Patient was placed in oxygen in triage but no low oxygen reading is documented.  It appears they did it for patient comfort.   Physical Exam  Constitutional: She is oriented to person, place, and time. She appears well-developed and well-nourished.  Non-toxic appearance. She does not appear ill. No distress.  HENT:  Head: Normocephalic and  atraumatic.  Right Ear: External ear normal.  Left Ear: External ear normal.  Nose: Nose normal. No mucosal edema or rhinorrhea.  Mouth/Throat: Oropharynx is clear and moist and mucous membranes are normal. No dental abscesses or uvula swelling.  Eyes: Conjunctivae and EOM are normal. Pupils are equal, round, and reactive to light.  Neck: Normal range of motion and full passive range of motion without pain. Neck supple.  Cardiovascular: Normal rate, regular rhythm and normal heart sounds. Exam reveals no gallop and no friction rub.  No murmur heard. Pulmonary/Chest: Effort normal. No respiratory distress. She has decreased breath sounds. She has no wheezes. She has no rhonchi. She has no rales. She exhibits no tenderness and no crepitus.  Patient does not appear to be short of breath, she is able to speak to me clearly.  Abdominal: Soft. Normal appearance and bowel sounds are normal. She exhibits no distension. There is no tenderness. There is no rebound and no guarding.  Musculoskeletal: Normal range of motion. She exhibits no edema or tenderness.  Neurological: She is alert and oriented to person, place, and time. She has normal strength. No cranial nerve deficit.  Patient has right-sided weakness from prior strokes  Skin: Skin is warm, dry and intact. No rash noted. No erythema. No pallor.  Psychiatric: She has a normal mood and affect. Her speech is normal and behavior is normal. Her mood appears not anxious.  Nursing note and vitals reviewed.    ED Treatments / Results  Labs (all labs ordered are listed, but only abnormal results are displayed) Results for orders placed or performed during the hospital encounter of 11/14/17  Basic metabolic panel  Result Value Ref Range   Sodium  136 135 - 145 mmol/L   Potassium 4.1 3.5 - 5.1 mmol/L   Chloride 104 101 - 111 mmol/L   CO2 21 (L) 22 - 32 mmol/L   Glucose, Bld 100 (H) 65 - 99 mg/dL   BUN 20 6 - 20 mg/dL   Creatinine, Ser 1.61 (H)  0.44 - 1.00 mg/dL   Calcium 9.5 8.9 - 09.6 mg/dL   GFR calc non Af Amer 48 (L) >60 mL/min   GFR calc Af Amer 56 (L) >60 mL/min   Anion gap 11 5 - 15  CBC  Result Value Ref Range   WBC 6.9 4.0 - 10.5 K/uL   RBC 4.30 3.87 - 5.11 MIL/uL   Hemoglobin 12.6 12.0 - 15.0 g/dL   HCT 04.5 40.9 - 81.1 %   MCV 93.0 78.0 - 100.0 fL   MCH 29.3 26.0 - 34.0 pg   MCHC 31.5 30.0 - 36.0 g/dL   RDW 91.4 78.2 - 95.6 %   Platelets 203 150 - 400 K/uL  I-stat troponin, ED  Result Value Ref Range   Troponin i, poc 0.00 0.00 - 0.08 ng/mL   Comment 3           Laboratory interpretation all normal except mild renal insufficiency, family states she had a GI bug last week.    EKG  EKG Interpretation  Date/Time:  Monday November 13 2017 16:44:25 EST Ventricular Rate:  83 PR Interval:  172 QRS Duration: 68 QT Interval:  356 QTC Calculation: 418 R Axis:   24 Text Interpretation:  Normal sinus rhythm Possible Left atrial enlargement No significant change since 30 Sep 2017 Confirmed by Devoria Albe ([id number]) on 11/14/2017 12:52:33 AM       Radiology Dg Chest 2 View  Result Date: 11/13/2017 CLINICAL DATA:  Intermittent dyspnea left-sided neck pain. EXAM: CHEST  2 VIEW COMPARISON:  Chest CT 07/19/2017, lumbar spine radiographs from 10/30/2017 FINDINGS: The heart size and mediastinal contours are within normal limits. Both lungs are clear. Bibasilar subsegmental atelectasis. No effusion or pneumothorax. Chronic right posterior rib fracture. No acute osseous abnormality. Chronic stable anterior compression of T11 IMPRESSION: No active cardiopulmonary disease. Chronic stable moderate compression of T11 without retropulsion. Electronically Signed   By: Tollie Eth M.D.   On: 11/13/2017 18:00   Ct Angio Chest Pe W/cm &/or Wo Cm  Result Date: 11/14/2017 CLINICAL DATA:  Intermittent shortness of breath and left-sided neck pain EXAM: CT ANGIOGRAPHY CHEST WITH CONTRAST TECHNIQUE: Multidetector CT imaging of the chest  was performed using the standard protocol during bolus administration of intravenous contrast. Multiplanar CT image reconstructions and MIPs were obtained to evaluate the vascular anatomy. CONTRAST:  60 mL Isovue 370 intravenous COMPARISON:  Radiograph 11/13/2017, CT chest 07/19/2017 FINDINGS: Cardiovascular: Satisfactory opacification of the pulmonary arteries to the segmental level. No evidence of pulmonary embolism. Nonaneurysmal aorta. Moderate aortic atherosclerosis. Normal heart size. Coronary artery calcification. No pericardial effusion. Reflux of contrast into the hepatic veins suggesting elevated right heart pressure Mediastinum/Nodes: Midline trachea. No thyroid mass. No significant adenopathy. Esophagus within normal limits. Lungs/Pleura: Mild emphysema. No acute consolidation, pleural effusion or pneumothorax. Upper Abdomen: No acute abnormality Musculoskeletal: Chronic compression deformity at T11 Review of the MIP images confirms the above findings. IMPRESSION: 1. Negative for acute pulmonary embolus. 2. Minimal emphysema 3. Reflux of contrast into the hepatic veins suggesting elevated right heart pressure. Aortic Atherosclerosis (ICD10-I70.0) and Emphysema (ICD10-J43.9). Electronically Signed   By: Jasmine Pang M.D.   On: 11/14/2017 02:43  Procedures Procedures (including critical care time)  Medications Ordered in ED Medications  albuterol (PROVENTIL HFA;VENTOLIN HFA) 108 (90 Base) MCG/ACT inhaler 2 puff (not administered)  AEROCHAMBER PLUS FLO-VU LARGE MISC (not administered)  iopamidol (ISOVUE-370) 76 % injection (100 mLs  Contrast Given 11/14/17 0202)     Initial Impression / Assessment and Plan / ED Course  I have reviewed the triage vital signs and the nursing notes.  Pertinent labs & imaging results that were available during my care of the patient were reviewed by me and considered in my medical decision making (see chart for details).     Family seems most concerned  about this pain in her neck, explained to her that that would not make her short of breath or have low oxygen levels.  CT angiogram of the chest was done to look for underlying PE with her recent history of swelling of her legs, patient has had several strokes.  She is taking a baby aspirin a day.  Her troponin was reassuring.  Recheck at 2:50 AM patient states she does ambulate however since her recent kyphoplasty she is using a walker.  She states she does feel better on the oxygen although her oxygen level was normal when the nursing staff put it on.  We discussed her CT result.  The only thing it shows is mild emphysema.  She has been on her inhaler several years ago but not recently.  I am going to have respiratory show patient how to use an inhaler again with a spacer and have nursing staff ambulate patient to make sure she does not get hypoxic, and if not she will be discharged home to follow-up with her PCP.  They may need to see a pulmonologist to further evaluate her shortness of breath or even cardiology in follow-up for her aortic stenosis and the increased pressures in her right heart which was evident on CT today.  3:40 AM nursing staff reports her pulse ox dropped to 85% with ambulation and she got very short of breath.  She was placed back in her room and placed back on oxygen.  I am going to talk to the hospitalist about the patient, she may need a echocardiogram and pulmonary function test to try to clarify why she is dyspneic and getting hypoxic.  She will most likely also need to be on oxygen at home.  At this point the etiology of her hypoxia and shortness of breath is unclear with only mild emphysema seen on her CT of her chest.  03:52 AM Dr Toniann Fail, hospitalist, will admit.   Patient and family informed of the results of her walking. Respiratory therapy is showing patient how to use an inhaler.    Final Clinical Impressions(s) / ED Diagnoses   Final diagnoses:  Shortness of  breath  Hypoxia    Plan admission  Devoria Albe, MD, Concha Pyo, MD 11/14/17 [phone]

## 2017-11-14 NOTE — ED Notes (Signed)
Patient transported to CT 

## 2017-11-14 NOTE — Progress Notes (Addendum)
PROGRESS NOTE    EUA LUNT  XLK:[id number] DOB: May 29, 1946 DOA: 11/14/2017 PCP: [name], MD  Brief Narrative:[name] [name] is a 72 y.o. female with history of COPD, hypertension, peripheral vascular disease, hypothyroidism and anxiety who had recent kyphoplasty presents to the ER with complaints of worsening shortness of breath on exertion, patient reports that this has worsened in the last few weeks but ongoing for close to a year now. -CT angiogram of the chest showed emphysema and features for possible elevated right heart pressure  Assessment & Plan:   1. Acute respiratory failure with hypoxia (HCC) -O2 saturation dropped below 90s with activity yesterday in the emergency room -CT angiogram of the chest : Notes emphysema, no acute findings and possible elevated right heart pressures  -Check ambulatory oxygen saturation  -Check 2-D echocardiogram to evaluate PA pressures and EF -Suspect her underlying COPD is probably the primary culprit here  -She has trace edema will give her low-dose of IV Lasix 1 for now   2. Essential hypertension  -BP in low-normal range, hold ACE inhibitor   3. COPD  -Stable, no wheezing, continue nebs when necessary   4. Peripheral vascular disease  -Continue aspirin,plavix and statins   5. History of CVA with mild residual hemiparesis  -Continue aspirin,plavix, statin   6. Recent kyphoplasty  -Stable   7. Hypothyroidism - continue Synthroid   DVT prophylaxis: Lovenox Code Status: Full code Family Communication: No family at bedside Disposition Plan:  home pending above workup   Procedures:   Antimicrobials:    Subjective: -Reports ongoing dyspnea on exertion for weeks to months  Objective: Vitals:   11/14/17 1000 11/14/17 1030 11/14/17 1100 11/14/17 1130  BP: 102/77  (!) 106/45   Pulse:  66 65 65  Resp: 20 19 16 16   Temp:      TempSrc:      SpO2:  100% 99% 100%   No intake or output data in the 24 hours  ending 11/14/17 1238 There were no vitals filed for this visit.  Examination:  General exam: Appears calm and comfortable, no distress Respiratory system: Clear to auscultation. Respiratory effort normal. Cardiovascular system: S1 & S2 heard, RRR. Gastrointestinal system: Abdomen is nondistended, soft and nontender.Normal bowel sounds heard. Central nervous system: Alert and oriented. No focal neurological deficits. Extremities: Symmetric 5 x 5 power. Skin: No rashes, lesions or ulcers Psychiatry: Judgement and insight appear normal. Mood & affect appropriate.     Data Reviewed:   CBC: Recent Labs  Lab 11/13/17 1657 11/14/17 0532  WBC 6.9 6.2  HGB 12.6 11.1*  HCT 40.0 35.2*  MCV 93.0 92.6  PLT 203 172   Basic Metabolic Panel: Recent Labs  Lab 11/13/17 1657 11/14/17 0532  NA 136  --   K 4.1  --   CL 104  --   CO2 21*  --   GLUCOSE 100*  --   BUN 20  --   CREATININE 1.12* 1.00  CALCIUM 9.5  --    GFR: CrCl cannot be calculated (Unknown ideal weight.). Liver Function Tests: No results for input(s): AST, ALT, ALKPHOS, BILITOT, PROT, ALBUMIN in the last 168 hours. No results for input(s): LIPASE, AMYLASE in the last 168 hours. No results for input(s): AMMONIA in the last 168 hours. Coagulation Profile: No results for input(s): INR, PROTIME in the last 168 hours. Cardiac Enzymes: No results for input(s): CKTOTAL, CKMB, CKMBINDEX, TROPONINI in the last 168 hours. BNP (last 3 results) No results for input(s):  PROBNP in the last 8760 hours. HbA1C: No results for input(s): HGBA1C in the last 72 hours. CBG: No results for input(s): GLUCAP in the last 168 hours. Lipid Profile: No results for input(s): CHOL, HDL, LDLCALC, TRIG, CHOLHDL, LDLDIRECT in the last 72 hours. Thyroid Function Tests: Recent Labs    11/14/17 0532  TSH 6.191*   Anemia Panel: No results for input(s): VITAMINB12, FOLATE, FERRITIN, TIBC, IRON, RETICCTPCT in the last 72 hours. Urine  analysis:    Component Value Date/Time   COLORURINE YELLOW 09/30/2017 1616   APPEARANCEUR CLEAR 09/30/2017 1616   LABSPEC 1.012 09/30/2017 1616   PHURINE 6.0 09/30/2017 1616   GLUCOSEU NEGATIVE 09/30/2017 1616   HGBUR NEGATIVE 09/30/2017 1616   BILIRUBINUR NEGATIVE 09/30/2017 1616   KETONESUR NEGATIVE 09/30/2017 1616   PROTEINUR NEGATIVE 09/30/2017 1616   UROBILINOGEN 0.2 08/02/2015 1200   NITRITE NEGATIVE 09/30/2017 1616   LEUKOCYTESUR SMALL (A) 09/30/2017 1616   Sepsis Labs: @LABRCNTIP (procalcitonin:4,lacticidven:4)  )No results found for this or any previous visit (from the past 240 hour(s)).       Radiology Studies: Dg Chest 2 View  Result Date: 11/13/2017 CLINICAL DATA:  Intermittent dyspnea left-sided neck pain. EXAM: CHEST  2 VIEW COMPARISON:  Chest CT 07/19/2017, lumbar spine radiographs from 10/30/2017 FINDINGS: The heart size and mediastinal contours are within normal limits. Both lungs are clear. Bibasilar subsegmental atelectasis. No effusion or pneumothorax. Chronic right posterior rib fracture. No acute osseous abnormality. Chronic stable anterior compression of T11 IMPRESSION: No active cardiopulmonary disease. Chronic stable moderate compression of T11 without retropulsion. Electronically Signed   By: Tollie Eth M.D.   On: 11/13/2017 18:00   Ct Angio Chest Pe W/cm &/or Wo Cm  Result Date: 11/14/2017 CLINICAL DATA:  Intermittent shortness of breath and left-sided neck pain EXAM: CT ANGIOGRAPHY CHEST WITH CONTRAST TECHNIQUE: Multidetector CT imaging of the chest was performed using the standard protocol during bolus administration of intravenous contrast. Multiplanar CT image reconstructions and MIPs were obtained to evaluate the vascular anatomy. CONTRAST:  60 mL Isovue 370 intravenous COMPARISON:  Radiograph 11/13/2017, CT chest 07/19/2017 FINDINGS: Cardiovascular: Satisfactory opacification of the pulmonary arteries to the segmental level. No evidence of pulmonary  embolism. Nonaneurysmal aorta. Moderate aortic atherosclerosis. Normal heart size. Coronary artery calcification. No pericardial effusion. Reflux of contrast into the hepatic veins suggesting elevated right heart pressure Mediastinum/Nodes: Midline trachea. No thyroid mass. No significant adenopathy. Esophagus within normal limits. Lungs/Pleura: Mild emphysema. No acute consolidation, pleural effusion or pneumothorax. Upper Abdomen: No acute abnormality Musculoskeletal: Chronic compression deformity at T11 Review of the MIP images confirms the above findings. IMPRESSION: 1. Negative for acute pulmonary embolus. 2. Minimal emphysema 3. Reflux of contrast into the hepatic veins suggesting elevated right heart pressure. Aortic Atherosclerosis (ICD10-I70.0) and Emphysema (ICD10-J43.9). Electronically Signed   By: Jasmine Pang M.D.   On: 11/14/2017 02:43        Scheduled Meds: . aspirin EC  81 mg Oral Daily  . atorvastatin  40 mg Oral Daily  . clopidogrel  75 mg Oral Daily  . enoxaparin (LOVENOX) injection  40 mg Subcutaneous Daily  . levothyroxine  25 mcg Oral QAC breakfast  . sertraline  100 mg Oral Daily  . traZODone  100 mg Oral QHS   Continuous Infusions:   LOS: 0 days    Time spent:    Zannie Cove, MD [facility] Page via [email or web address], password [email or web address] After 7PM please contact night-coverage  11/14/2017, 12:38 PM

## 2017-11-14 NOTE — ED Notes (Signed)
Patient ambulated on room air. O2 sats dropped between 85%and 88% while walking. Patient returned to stretcher, placed on 2L [place]. SpO2 was 98% on 2 L. Patient resting in bed comfortably. NAD. VSS. Call light within reach.

## 2017-11-14 NOTE — ED Notes (Signed)
RT at bedside for spacer and MDI teaching

## 2017-11-14 NOTE — Progress Notes (Signed)
RT instructed pt. On how to use albuterol inhaler and aerochamber. Pt. Will need assistance for future use of the inhaler.

## 2017-11-14 NOTE — Evaluation (Signed)
Physical Therapy Evaluation Patient Details Name: [name] [name] MRN: [id number] DOB: 03-Jan-1946 Today's Date: 11/14/2017   History of Present Illness  72yo female with recent kyphoplasty presenting with increased SOB on exertion. Easily desaturates with walking, CT concern for emphysema and increased R heart pressure. Diagnosed with exertional dyspnea/acute respiratory failure with hypoxia. PMH aneurysm, anxiety and depression, hx concussion, COPD, depression, hx falls, hx C5 fracture, HTN, LBP, OA, osteoporosis, PVD, CVA, hx hip surgery, kyphoplasty 10/02/17  Clinical Impression   Patient received in bed, pleasant and willing to participate in PT this afternoon. She requires min guard for all functional mobility but does require cues for safety throughout today's evaluation; note that evaluation was limited as patient was about to leave for transfer to 3 [facility] unit. SpO2 did drop from 96% on 2LPM O2 to 83% then 75% during gait of 94ft, very rapid recovery to 98% with sitting rest and possibly due to poor signal however patient does have a history of rapid O2 desaturation per attending RN.  She was left up in the transport chair with RN present and attending, preparing to leave for new unit. Moving forward, she will continue to benefit from skilled PT services in the acute setting, and will likely benefit from resumption of skilled HHPT services moving forward.     Follow Up Recommendations Home health PT    Equipment Recommendations  Other (comment)(shower seat )    Recommendations for Other Services       Precautions / Restrictions Precautions Precautions: Fall;Other (comment) Precaution Comments: kyphoplasty 10/02/17, watch SpO2  Restrictions Weight Bearing Restrictions: No      Mobility  Bed Mobility Overal bed mobility: Needs Assistance Bed Mobility: Supine to Sit     Supine to sit: Min guard     General bed mobility comments: Min guard for safety, increased time and effort    Transfers Overall transfer level: Needs assistance Equipment used: Rolling walker (2 wheeled) Transfers: Sit to/from Stand Sit to Stand: Min guard         General transfer comment: cues for safety   Ambulation/Gait Ambulation/Gait assistance: Min guard Ambulation Distance (Feet): 6 Feet Assistive device: Rolling walker (2 wheeled) Gait Pattern/deviations: Step-through pattern;Decreased step length - right;Decreased step length - left;Trunk flexed     General Gait Details: SpO2 drop to 83% then 74% on 2LPM, rapid recovery with seated rest with pursed lip breathing; gait distance limited as patient about to be transported to 3 [employer]    Modified Rankin (Stroke Patients Only)       Balance Overall balance assessment: Mild deficits observed, not formally tested                                           Pertinent Vitals/Pain Pain Assessment: No/denies pain    Home Living Family/patient expects to be discharged to:: Private residence Living Arrangements: Other relatives Available Help at Discharge: Family;Available 24 hours/day Type of Home: House Home Access: Stairs to enter Entrance Stairs-Rails: Right Entrance Stairs-Number of Steps: 3 Home Layout: One level Home Equipment: Walker - 2 wheels;Bedside commode;Shower seat Additional Comments: Children will be able to help as well     Prior Function Level of Independence: Needs assistance   Gait / Transfers Assistance Needed: Used RW for mobility but reports she had  supervision.   ADL's / Homemaking Assistance Needed: Needed assist with bathing and dressing        Hand Dominance        Extremity/Trunk Assessment        Lower Extremity Assessment Lower Extremity Assessment: Generalized weakness    Cervical / Trunk Assessment Cervical / Trunk Assessment: Kyphotic  Communication   Communication: No difficulties  Cognition  Arousal/Alertness: Awake/alert Behavior During Therapy: WFL for tasks assessed/performed Overall Cognitive Status: Within Functional Limits for tasks assessed                                        General Comments      Exercises     Assessment/Plan    PT Assessment Patient needs continued PT services  PT Problem List Decreased strength;Decreased mobility;Decreased safety awareness;Decreased coordination;Decreased knowledge of precautions;Decreased activity tolerance;Cardiopulmonary status limiting activity;Decreased balance       PT Treatment Interventions DME instruction;Therapeutic activities;Gait training;Therapeutic exercise;Patient/family education;Stair training;Balance training;Functional mobility training;Neuromuscular re-education    PT Goals (Current goals can be found in the Care Plan section)  Acute Rehab PT Goals Patient Stated Goal: to go home  PT Goal Formulation: With patient Time For Goal Achievement: 11/28/17 Potential to Achieve Goals: Good    Frequency Min 3X/week   Barriers to discharge        Co-evaluation               AM-PAC PT "6 Clicks" Daily Activity  Outcome Measure Difficulty turning over in bed (including adjusting bedclothes, sheets and blankets)?: None Difficulty moving from lying on back to sitting on the side of the bed? : None Difficulty sitting down on and standing up from a chair with arms (e.g., wheelchair, bedside commode, etc,.)?: None Help needed moving to and from a bed to chair (including a wheelchair)?: A Little Help needed walking in hospital room?: A Little Help needed climbing 3-5 steps with a railing? : A Lot 6 Click Score: 20    End of Session Equipment Utilized During Treatment: Gait belt;Oxygen Activity Tolerance: Patient tolerated treatment well Patient left: Other (comment)(in transport chair with RN attending )   PT Visit Diagnosis: Unsteadiness on feet (R26.81);Muscle weakness  (generalized) (M62.81);History of falling (Z91.81)    Time: 1350-1407 PT Time Calculation (min) (ACUTE ONLY): 17 min   Charges:   PT Evaluation $PT Eval Low Complexity: 1 Low     PT G Codes:        Nedra Hai PT, DPT, CBIS  Supplemental Physical Therapist [facility]   Pager [phone]

## 2017-11-14 NOTE — ED Notes (Signed)
Pt ambulated to restroom with steady gait. Stand by assist for safety. Pt back in bed resting comfortably. Family at bedside.

## 2017-11-15 DIAGNOSIS — I69351 Hemiplegia and hemiparesis following cerebral infarction affecting right dominant side: Secondary | ICD-10-CM | POA: Diagnosis not present

## 2017-11-15 DIAGNOSIS — E039 Hypothyroidism, unspecified: Secondary | ICD-10-CM | POA: Diagnosis not present

## 2017-11-15 DIAGNOSIS — S32040D Wedge compression fracture of fourth lumbar vertebra, subsequent encounter for fracture with routine healing: Secondary | ICD-10-CM | POA: Diagnosis not present

## 2017-11-15 DIAGNOSIS — J432 Centrilobular emphysema: Secondary | ICD-10-CM

## 2017-11-15 DIAGNOSIS — J9601 Acute respiratory failure with hypoxia: Secondary | ICD-10-CM | POA: Diagnosis not present

## 2017-11-15 LAB — BASIC METABOLIC PANEL
Anion gap: 11 (ref 5–15)
BUN: 18 mg/dL (ref 6–20)
CO2: 26 mmol/L (ref 22–32)
Calcium: 9.2 mg/dL (ref 8.9–10.3)
Chloride: 102 mmol/L (ref 101–111)
Creatinine, Ser: 0.89 mg/dL (ref 0.44–1.00)
GFR calc Af Amer: >60 mL/min (ref >60)
GFR calc non Af Amer: >60 mL/min (ref >60)
Glucose, Bld: 110 mg/dL — ABNORMAL HIGH (ref 65–99)
Potassium: 4.3 mmol/L (ref 3.5–5.1)
Sodium: 139 mmol/L (ref 135–145)

## 2017-11-15 LAB — CBC
HCT: 38.6 % (ref 36.0–46.0)
Hemoglobin: 12.1 g/dL (ref 12.0–15.0)
MCH: 29.4 pg (ref 26.0–34.0)
MCHC: 31.3 g/dL (ref 30.0–36.0)
MCV: 93.9 fL (ref 78.0–100.0)
Platelets: 185 10*3/uL (ref 150–400)
RBC: 4.11 MIL/uL (ref 3.87–5.11)
RDW: 13.7 % (ref 11.5–15.5)
WBC: 5.7 10*3/uL (ref 4.0–10.5)

## 2017-11-15 MED ORDER — POTASSIUM CHLORIDE ER 20 MEQ PO TBCR: 40.0000 meq | EXTENDED_RELEASE_TABLET | ORAL | 0 refills | Status: DC | PRN

## 2017-11-15 MED ORDER — FUROSEMIDE 20 MG PO TABS: 20.0000 mg | ORAL_TABLET | ORAL | 0 refills | Status: DC | PRN

## 2017-11-15 NOTE — Plan of Care (Signed)
Pt. Continues to remain free from injury. Call light placed within reach. Pt. Calls for assistance when needed.

## 2017-11-15 NOTE — Progress Notes (Signed)
PROGRESS NOTE    [name] [name]  ONG:[id number] DOB: Mar 29, 1946 DOA: 11/14/2017 PCP: [name], MD  Brief Narrative:[name] [name] is a 72 y.o. female with history of COPD, hypertension, peripheral vascular disease, hypothyroidism and anxiety who had recent kyphoplasty presents to the ER with complaints of worsening shortness of breath on exertion, patient reports that this has worsened in the last few weeks but ongoing for close to a year now. -CT angiogram of the chest showed emphysema and features for possible elevated right heart pressure  Assessment & Plan:   1. Acute respiratory failure with hypoxia (HCC) -O2 saturation dropped below 90s with activity in the emergency room -CT angiogram of the chest : Notes emphysema, no acute findings and possible elevated right heart pressures  -Ambulatory oxygen saturation dropped to 77%off O2 with activity hence qualifies for Home O2 -2-D echocardiogram with normal EF, PA pressures and grade 1DD -Suspect her underlying COPD is probably the primary culprit here  -due to trace edema and dyspnea, was given 20mg  IV lasix 2/19, but today BP is 90s and she is dizzy, encouraged PO fluid intake, if no improvement will give fluid bolus  2. Essential hypertension now Hypotension -BP in low range, hold ACE inhibitor  -hold off on further diuresis -FLuid bolus if no improvement  3. COPD  -Stable, no wheezing, continue nebs when necessary   4. Peripheral vascular disease  -Continue aspirin,plavix and statins   5. History of CVA with mild residual hemiparesis  -Continue aspirin,plavix, statin   6. Recent kyphoplasty  -Stable   7. Hypothyroidism - continue Synthroid   DVT prophylaxis: Lovenox Code Status: Full code Family Communication: No family at bedside Disposition Plan:  home tomorrow if BP imporved, and symptoms stable   Procedures:   Antimicrobials:    Subjective: -breathing better, felt dizzy last  night  Objective: Vitals:   11/15/17 0850 11/15/17 0852 11/15/17 1100 11/15/17 1219  BP: (!) 89/50 (!) 90/50 (!) 124/54 123/61  Pulse: 92 90 89 81  Resp:  18  20  Temp:    98.6 F (37 C)  TempSrc:    Oral  SpO2:  99%  99%  Weight:      Height:        Intake/Output Summary (Last 24 hours) at 11/15/2017 1412 Last data filed at 11/15/2017 1100 Gross per 24 hour  Intake 480 ml  Output 600 ml  Net -120 ml   Filed Weights   11/14/17 1417 11/15/17 0603  Weight: 68.6 kg (151 lb 3.2 oz) 67.9 kg (149 lb 12.8 oz)    Examination:  Gen: Awake, Alert, Oriented X 3,  HEENT: PERRLA, Neck supple, no JVD Lungs: improved air movement , CTAB CVS: RRR,No Gallops,Rubs or new Murmurs Abd: soft, Non tender, non distended, BS present Extremities: trace edema Skin: no new rashes Psychiatry: Judgement and insight appear normal. Mood & affect appropriate.     Data Reviewed:   CBC: Recent Labs  Lab 11/13/17 1657 11/14/17 0532 11/15/17 0633  WBC 6.9 6.2 5.7  HGB 12.6 11.1* 12.1  HCT 40.0 35.2* 38.6  MCV 93.0 92.6 93.9  PLT 203 172 185   Basic Metabolic Panel: Recent Labs  Lab 11/13/17 1657 11/14/17 0532 11/15/17 0633  NA 136  --  139  K 4.1  --  4.3  CL 104  --  102  CO2 21*  --  26  GLUCOSE 100*  --  110*  BUN 20  --  18  CREATININE 1.12* 1.00  0.89  CALCIUM 9.5  --  9.2   GFR: Estimated Creatinine Clearance: 49.9 mL/min (by C-G formula based on SCr of 0.89 mg/dL). Liver Function Tests: No results for input(s): AST, ALT, ALKPHOS, BILITOT, PROT, ALBUMIN in the last 168 hours. No results for input(s): LIPASE, AMYLASE in the last 168 hours. No results for input(s): AMMONIA in the last 168 hours. Coagulation Profile: No results for input(s): INR, PROTIME in the last 168 hours. Cardiac Enzymes: Recent Labs  Lab 11/14/17 1339  TROPONINI <0.03   BNP (last 3 results) No results for input(s): PROBNP in the last 8760 hours. HbA1C: No results for input(s): HGBA1C in the  last 72 hours. CBG: No results for input(s): GLUCAP in the last 168 hours. Lipid Profile: No results for input(s): CHOL, HDL, LDLCALC, TRIG, CHOLHDL, LDLDIRECT in the last 72 hours. Thyroid Function Tests: Recent Labs    11/14/17 0532 11/14/17 1339  TSH 6.191*  --   FREET4  --  0.87   Anemia Panel: No results for input(s): VITAMINB12, FOLATE, FERRITIN, TIBC, IRON, RETICCTPCT in the last 72 hours. Urine analysis:    Component Value Date/Time   COLORURINE YELLOW 09/30/2017 1616   APPEARANCEUR CLEAR 09/30/2017 1616   LABSPEC 1.012 09/30/2017 1616   PHURINE 6.0 09/30/2017 1616   GLUCOSEU NEGATIVE 09/30/2017 1616   HGBUR NEGATIVE 09/30/2017 1616   BILIRUBINUR NEGATIVE 09/30/2017 1616   KETONESUR NEGATIVE 09/30/2017 1616   PROTEINUR NEGATIVE 09/30/2017 1616   UROBILINOGEN 0.2 08/02/2015 1200   NITRITE NEGATIVE 09/30/2017 1616   LEUKOCYTESUR SMALL (A) 09/30/2017 1616   Sepsis Labs: @LABRCNTIP (procalcitonin:4,lacticidven:4)  )No results found for this or any previous visit (from the past 240 hour(s)).       Radiology Studies: Dg Chest 2 View  Result Date: 11/13/2017 CLINICAL DATA:  Intermittent dyspnea left-sided neck pain. EXAM: CHEST  2 VIEW COMPARISON:  Chest CT 07/19/2017, lumbar spine radiographs from 10/30/2017 FINDINGS: The heart size and mediastinal contours are within normal limits. Both lungs are clear. Bibasilar subsegmental atelectasis. No effusion or pneumothorax. Chronic right posterior rib fracture. No acute osseous abnormality. Chronic stable anterior compression of T11 IMPRESSION: No active cardiopulmonary disease. Chronic stable moderate compression of T11 without retropulsion. Electronically Signed   By: Tollie Eth M.D.   On: 11/13/2017 18:00   Ct Angio Chest Pe W/cm &/or Wo Cm  Result Date: 11/14/2017 CLINICAL DATA:  Intermittent shortness of breath and left-sided neck pain EXAM: CT ANGIOGRAPHY CHEST WITH CONTRAST TECHNIQUE: Multidetector CT imaging of the  chest was performed using the standard protocol during bolus administration of intravenous contrast. Multiplanar CT image reconstructions and MIPs were obtained to evaluate the vascular anatomy. CONTRAST:  60 mL Isovue 370 intravenous COMPARISON:  Radiograph 11/13/2017, CT chest 07/19/2017 FINDINGS: Cardiovascular: Satisfactory opacification of the pulmonary arteries to the segmental level. No evidence of pulmonary embolism. Nonaneurysmal aorta. Moderate aortic atherosclerosis. Normal heart size. Coronary artery calcification. No pericardial effusion. Reflux of contrast into the hepatic veins suggesting elevated right heart pressure Mediastinum/Nodes: Midline trachea. No thyroid mass. No significant adenopathy. Esophagus within normal limits. Lungs/Pleura: Mild emphysema. No acute consolidation, pleural effusion or pneumothorax. Upper Abdomen: No acute abnormality Musculoskeletal: Chronic compression deformity at T11 Review of the MIP images confirms the above findings. IMPRESSION: 1. Negative for acute pulmonary embolus. 2. Minimal emphysema 3. Reflux of contrast into the hepatic veins suggesting elevated right heart pressure. Aortic Atherosclerosis (ICD10-I70.0) and Emphysema (ICD10-J43.9). Electronically Signed   By: Jasmine Pang M.D.   On: 11/14/2017 02:43  Scheduled Meds: . aspirin EC  81 mg Oral Daily  . atorvastatin  40 mg Oral Daily  . clopidogrel  75 mg Oral Daily  . enoxaparin (LOVENOX) injection  40 mg Subcutaneous Daily  . feeding supplement (ENSURE ENLIVE)  237 mL Oral BID BM  . levothyroxine  25 mcg Oral QAC breakfast  . sertraline  100 mg Oral Daily  . traZODone  100 mg Oral QHS   Continuous Infusions:   LOS: 0 days    Time spent:    Zannie Cove, MD [facility] Page via [email or web address], password [email or web address] After 7PM please contact night-coverage  11/15/2017, 2:12 PM

## 2017-11-15 NOTE — Progress Notes (Signed)
SATURATION QUALIFICATIONS: (This note is used to comply with regulatory documentation for home oxygen)  Patient Saturations on Room Air at Rest = 90%  Patient Saturations on Room Air while Ambulating = 77%  Patient Saturations on  3 Liters of oxygen while Ambulating = 90%  Please briefly explain why patient needs home oxygen:

## 2017-11-15 NOTE — Care Management Obs Status (Signed)
MEDICARE OBSERVATION STATUS NOTIFICATION   Patient Details  Name: [name] [name] MRN: [id number] Date of Birth: Feb 07, 1946   Medicare Observation Status Notification Given:  Yes    Lawerance Sabalebbie Jerre Diguglielmo, RN 11/15/2017, 10:09 AM

## 2017-11-16 DIAGNOSIS — I69351 Hemiplegia and hemiparesis following cerebral infarction affecting right dominant side: Secondary | ICD-10-CM | POA: Diagnosis not present

## 2017-11-16 DIAGNOSIS — E039 Hypothyroidism, unspecified: Secondary | ICD-10-CM | POA: Diagnosis not present

## 2017-11-16 DIAGNOSIS — J9601 Acute respiratory failure with hypoxia: Secondary | ICD-10-CM | POA: Diagnosis not present

## 2017-11-16 DIAGNOSIS — R269 Unspecified abnormalities of gait and mobility: Secondary | ICD-10-CM | POA: Diagnosis not present

## 2017-11-16 DIAGNOSIS — S32040D Wedge compression fracture of fourth lumbar vertebra, subsequent encounter for fracture with routine healing: Secondary | ICD-10-CM | POA: Diagnosis not present

## 2017-11-16 DIAGNOSIS — J449 Chronic obstructive pulmonary disease, unspecified: Secondary | ICD-10-CM | POA: Diagnosis not present

## 2017-11-16 DIAGNOSIS — J432 Centrilobular emphysema: Secondary | ICD-10-CM | POA: Diagnosis not present

## 2017-11-16 LAB — BASIC METABOLIC PANEL
Anion gap: 11 (ref 5–15)
BUN: 17 mg/dL (ref 6–20)
CO2: 24 mmol/L (ref 22–32)
Calcium: 9.1 mg/dL (ref 8.9–10.3)
Chloride: 103 mmol/L (ref 101–111)
Creatinine, Ser: 0.87 mg/dL (ref 0.44–1.00)
GFR calc Af Amer: >60 mL/min (ref >60)
GFR calc non Af Amer: >60 mL/min (ref >60)
Glucose, Bld: 103 mg/dL — ABNORMAL HIGH (ref 65–99)
Potassium: 4.3 mmol/L (ref 3.5–5.1)
Sodium: 138 mmol/L (ref 135–145)

## 2017-11-16 LAB — CBC
HCT: 38.3 % (ref 36.0–46.0)
Hemoglobin: 12.1 g/dL (ref 12.0–15.0)
MCH: 29.5 pg (ref 26.0–34.0)
MCHC: 31.6 g/dL (ref 30.0–36.0)
MCV: 93.4 fL (ref 78.0–100.0)
Platelets: 183 10*3/uL (ref 150–400)
RBC: 4.1 MIL/uL (ref 3.87–5.11)
RDW: 13.5 % (ref 11.5–15.5)
WBC: 5.8 10*3/uL (ref 4.0–10.5)

## 2017-11-16 NOTE — Plan of Care (Addendum)
Pt. Continues to remain free from injury. Call light within reach for safety. Pt. Encouraged to call for assistance when needed.

## 2017-11-16 NOTE — Progress Notes (Signed)
Discharge to home daughter at bedside. D/c instructions and follow up appointments discussed with patient verbalized understanding.  PIV removed no s/s of infiltration or swelling noted.Discharge on home O2.

## 2017-11-16 NOTE — Consult Note (Signed)
   [facility] CM Inpatient Consult   11/16/2017  [name] [name] 04/17/1946 [id number]  Patient screened for potential [employer] services for COPD exacerbation. Met with the patient and her daughter [name] [name] at the bedside regarding benefits of [facility] Care Management in disease and care management.  They are currently waiting on her home oxygen [new].  Patient and denies any issues for getting to medical appointments, medications, or needs. Patient endorses Dr. Ria Bush as her primary care provider.  Patient is in the [place] of the [employer] [facility] services under patient's [facility] plan.  Patient and daughter agreeable to COPD EMMI calls.  They accepted a brochure with 24 hour nurse advise line magnet with contact information.  Encouraged to call at anytime.   For questions contact:   Natividad Brood, RN BSN [facility] Liaison  [phone] business mobile phone Toll free office [phone]

## 2017-11-16 NOTE — Discharge Summary (Signed)
Physician Discharge Summary  QADRIYYAH BERGSMA GUY:[id number] DOB: 1946/02/21 DOA: 11/14/2017  PCP: [name], MD  Admit date: 11/14/2017 Discharge date: 11/16/2017  Time spent: 35 minutes  Recommendations for Outpatient Follow-up:  1. PCP in 1 week, set up with Home O2 for COPD   Discharge Diagnoses:  Principal Problem:   Acute respiratory failure with hypoxia (HCC) Active Problems:   Hypertension   Peripheral vascular disease (HCC)   COPD (chronic obstructive pulmonary disease) (HCC)   Hemiparesis affecting right side as late effect of stroke (HCC)   Hypothyroidism (acquired)   Closed compression fracture of fourth lumbar vertebra (HCC)   SOB (shortness of breath)   Discharge Condition: stable  Diet recommendation: low sodium  Filed Weights   11/14/17 1417 11/15/17 0603 11/16/17 0542  Weight: 68.6 kg (151 lb 3.2 oz) 67.9 kg (149 lb 12.8 oz) 68.1 kg (150 lb 1.6 oz)    History of present illness:  Candid S Hutchinsis a 72 y.o.femalewithhistory of COPD, hypertension, peripheral vascular disease, hypothyroidism and anxiety who had recent kyphoplasty presents to the ER with complaints of worsening shortness of breath on exertion, patient reports that this has worsened in the last few weeks but ongoing for close to a year now. -CT angiogram of the chest showed emphysema and features for possible elevated right heart pressure  Hospital Course:  1. Acute respiratory failure with hypoxia (HCC) -Pt reported ongoing Dyspnea with exertion for a year -O2 saturation dropped below 90s with activity in the emergency room -CT angiogram of the chest : Notes emphysema, no acute findings and possible elevated right heart pressures  -Ambulatory oxygen saturation dropped to 80% off O2 with activity hence qualifies for Home O2 -2-D echocardiogram noted normal EF, normal PA pressures and grade 1DD -Suspect her underlying COPD is probably the primary culprit here  -due to trace edema  and dyspnea, was given 20mg  IV lasix 2/19 Bp soft after that and hence do not think she needs a standing diuretic, given a script for lasix 20mg  with KCL to take as needed for edema/fluid weight gain  2. Essential hypertension  -BP in low normal range, stopped ACE inhibitor  -lasix PRN as needed for edema at discharge  3. COPD  -Stable, no wheezing, continue nebs when necessary  -discharged home on 2L Home O2  4. Peripheral vascular disease  -Continue aspirin,plavix and statins   5. History of CVA with mild residual hemiparesis  -Continue aspirin,plavix, statin   6. Recent kyphoplasty  -Stable   7. Hypothyroidism - continue Synthroid     Discharge Exam: Vitals:   11/15/17 2025 11/16/17 0542  BP: (!) 119/52 (!) 137/59  Pulse: 72 83  Resp: 20 18  Temp: 98.2 F (36.8 C) 98 F (36.7 C)  SpO2: 99% 99%    General: AAOx3 Cardiovascular: S1S2/RRR Respiratory: CTAB  Discharge Instructions   Discharge Instructions    Diet - low sodium heart healthy   Complete by:  As directed    Increase activity slowly   Complete by:  As directed      Allergies as of 11/16/2017      Reactions   Doxycycline Other (See Comments)   Sig GI upset      Medication List    TAKE these medications   acetaminophen 500 MG tablet Commonly known as:  TYLENOL Take 500 mg by mouth 2 (two) times daily as needed for mild pain.   alendronate 70 MG tablet Commonly known as:  FOSAMAX TAKE 1 TABLET WEEKLY  WITH A FULL GLASS OF WATER ON AN EMPTY STOMACH   aspirin EC 81 MG tablet Take 81 mg by mouth daily.   atorvastatin 40 MG tablet Commonly known as:  LIPITOR TAKE 1 TABLET (40 MG TOTAL) BY MOUTH DAILY.   benazepril 10 MG tablet Commonly known as:  LOTENSIN Take 1 tablet (10 mg total) by mouth daily.   Calcium-Vitamin D 600-400 MG-UNIT Tabs Take 1 tablet by mouth daily.   clopidogrel 75 MG tablet Commonly known as:  PLAVIX TAKE 1 TABLET EVERY DAY   fexofenadine 180 MG  tablet Commonly known as:  ALLEGRA Take 180 mg by mouth daily as needed for allergies.   furosemide 20 MG tablet Commonly known as:  LASIX Take 1 tablet (20 mg total) by mouth as needed for fluid or edema. May take once or twice a week for swelling, need to take Potassium whenever you take this   levothyroxine 25 MCG tablet Commonly known as:  SYNTHROID, LEVOTHROID Take 1 tablet (25 mcg total) by mouth daily before breakfast.   oxyCODONE 5 MG immediate release tablet Commonly known as:  Oxy IR/ROXICODONE Take one to two tablets as needed for pain control every 6 hours. What changed:    how much to take  how to take this  when to take this  reasons to take this  additional instructions   Potassium Chloride ER 20 MEQ Tbcr Take 40 mEq by mouth as needed (whenever you take lasix). To be taken with lasix   sertraline 100 MG tablet Commonly known as:  ZOLOFT Take 1 tablet (100 mg total) by mouth daily.   traMADol 50 MG tablet Commonly known as:  ULTRAM Take 50-100 mg by mouth every 6 (six) hours as needed for moderate pain.   traZODone 100 MG tablet Commonly known as:  DESYREL Take 1 tablet (100 mg total) by mouth at bedtime. for sleep   VITAMIN D PO Take 1 capsule by mouth daily.            Durable Medical Equipment  (From admission, onward)        Start     Ordered   11/16/17 1100  For home use only DME oxygen  Once    Question Answer Comment  Mode or (Route) Nasal cannula   Liters per Minute 2   Frequency Continuous (stationary and portable oxygen unit needed)   Oxygen conserving device Yes   Oxygen delivery system Gas      11/16/17 1100     Allergies  Allergen Reactions  . Doxycycline Other (See Comments)    Sig GI upset   Follow-up Information    [name], MD. Schedule an appointment as soon as possible for a visit on 11/20/2017.   Specialty:  Family Medicine Why:  At 11:30 AM. Contact information: [address] [place]  [place] [address] [phone]        [facility]-Home Follow up.   Specialty:  Home Health Services Why:  They will do your home health care at your home Contact information: [address] [place] [address] [phone]            The results of significant diagnostics from this hospitalization (including imaging, microbiology, ancillary and laboratory) are listed below for reference.    Significant Diagnostic Studies: Dg Chest 2 View  Result Date: 11/13/2017 CLINICAL DATA:  Intermittent dyspnea left-sided neck pain. EXAM: CHEST  2 VIEW COMPARISON:  Chest CT 07/19/2017, lumbar spine radiographs from  10/30/2017 FINDINGS: The heart size and mediastinal contours are within normal limits. Both lungs are clear. Bibasilar subsegmental atelectasis. No effusion or pneumothorax. Chronic right posterior rib fracture. No acute osseous abnormality. Chronic stable anterior compression of T11 IMPRESSION: No active cardiopulmonary disease. Chronic stable moderate compression of T11 without retropulsion. Electronically Signed   By: Tollie Eth M.D.   On: 11/13/2017 18:00   Ct Angio Chest Pe W/cm &/or Wo Cm  Result Date: 11/14/2017 CLINICAL DATA:  Intermittent shortness of breath and left-sided neck pain EXAM: CT ANGIOGRAPHY CHEST WITH CONTRAST TECHNIQUE: Multidetector CT imaging of the chest was performed using the standard protocol during bolus administration of intravenous contrast. Multiplanar CT image reconstructions and MIPs were obtained to evaluate the vascular anatomy. CONTRAST:  60 mL Isovue 370 intravenous COMPARISON:  Radiograph 11/13/2017, CT chest 07/19/2017 FINDINGS: Cardiovascular: Satisfactory opacification of the pulmonary arteries to the segmental level. No evidence of pulmonary embolism. Nonaneurysmal aorta. Moderate aortic atherosclerosis. Normal heart size. Coronary artery calcification. No pericardial effusion. Reflux of contrast into the hepatic veins suggesting  elevated right heart pressure Mediastinum/Nodes: Midline trachea. No thyroid mass. No significant adenopathy. Esophagus within normal limits. Lungs/Pleura: Mild emphysema. No acute consolidation, pleural effusion or pneumothorax. Upper Abdomen: No acute abnormality Musculoskeletal: Chronic compression deformity at T11 Review of the MIP images confirms the above findings. IMPRESSION: 1. Negative for acute pulmonary embolus. 2. Minimal emphysema 3. Reflux of contrast into the hepatic veins suggesting elevated right heart pressure. Aortic Atherosclerosis (ICD10-I70.0) and Emphysema (ICD10-J43.9). Electronically Signed   By: Jasmine Pang M.D.   On: 11/14/2017 02:43    Microbiology: No results found for this or any previous visit (from the past 240 hour(s)).   Labs: Basic Metabolic Panel: Recent Labs  Lab 11/13/17 1657 11/14/17 0532 11/15/17 6433 11/16/17 0450  NA 136  --  139 138  K 4.1  --  4.3 4.3  CL 104  --  102 103  CO2 21*  --  26 24  GLUCOSE 100*  --  110* 103*  BUN 20  --  18 17  CREATININE 1.12* 1.00 0.89 0.87  CALCIUM 9.5  --  9.2 9.1   Liver Function Tests: No results for input(s): AST, ALT, ALKPHOS, BILITOT, PROT, ALBUMIN in the last 168 hours. No results for input(s): LIPASE, AMYLASE in the last 168 hours. No results for input(s): AMMONIA in the last 168 hours. CBC: Recent Labs  Lab 11/13/17 1657 11/14/17 0532 11/15/17 0633 11/16/17 0450  WBC 6.9 6.2 5.7 5.8  HGB 12.6 11.1* 12.1 12.1  HCT 40.0 35.2* 38.6 38.3  MCV 93.0 92.6 93.9 93.4  PLT 203 172 185 183   Cardiac Enzymes: Recent Labs  Lab 11/14/17 1339  TROPONINI <0.03   BNP: BNP (last 3 results) No results for input(s): BNP in the last 8760 hours.  ProBNP (last 3 results) No results for input(s): PROBNP in the last 8760 hours.  CBG: No results for input(s): GLUCAP in the last 168 hours.     Signed:  Zannie Cove MD.  [facility] 11/16/2017, 1:36 PM

## 2017-11-16 NOTE — Care Management Note (Signed)
Case Management Note  Patient Details  Name: [name] [name] MRN: [id number] Date of Birth: [birth year]  Subjective/Objective:    Acute Resp Failure               Action/Plan: [facility] called for home oxygen and patient is active with [facility] as prior to admission for HHPT; [facility].  Expected Discharge Date:  11/16/17               Expected Discharge Plan:   Home with HHC  DME Arranged:   Home Oxygen DME Agency:   [facility]  HH Arr[facility]   [facility]   [facility]  Status of Service:   Completed  Reola MosherChandler, Amyr Sluder L, RN,MHA,BSN [phone] 11/16/2017, 10:13 AM

## 2017-11-16 NOTE — Progress Notes (Signed)
Physical Therapy Treatment Patient Details Name: [name] [name] MRN: [id number] DOB: September 09, 1946 Today's Date: 11/16/2017    History of Present Illness 72yo female with recent kyphoplasty presenting with increased SOB on exertion. Easily desaturates with walking, CT concern for emphysema and increased R heart pressure. Diagnosed with exertional dyspnea/acute respiratory failure with hypoxia. PMH aneurysm, anxiety and depression, hx concussion, COPD, depression, hx falls, hx C5 fracture, HTN, LBP, OA, osteoporosis, PVD, CVA, hx hip surgery, kyphoplasty 10/02/17    PT Comments    Pt resting in bed on arrival.  Instructed patient on plan to perform stair training to ensure safe entry into home.  Pt tolerated stair training well and remains appropriate to return home with support from her daughter.  Plan for d/c later this am.    Follow Up Recommendations  Home health PT     Equipment Recommendations  Other (comment)(shower seat)    Recommendations for Other Services       Precautions / Restrictions Precautions Precautions: Fall;Other (comment) Precaution Comments: kyphoplasty 10/02/17, watch SpO2  Restrictions Weight Bearing Restrictions: No    Mobility  Bed Mobility Overal bed mobility: Needs Assistance Bed Mobility: Supine to Sit     Supine to sit: Min guard     General bed mobility comments: Min guard for safety, increased time and effort   Transfers Overall transfer level: Needs assistance Equipment used: Rolling walker (2 wheeled) Transfers: Sit to/from Stand Sit to Stand: Min guard         General transfer comment: cues for safety and hand placement to and from seated surface.    Ambulation/Gait Ambulation/Gait assistance: Min guard Ambulation Distance (Feet): 80 Feet Assistive device: Rolling walker (2 wheeled) Gait Pattern/deviations: Step-through pattern;Decreased step length - right;Decreased step length - left;Trunk flexed   Gait velocity  interpretation: Below normal speed for age/gender General Gait Details: Pt required cues for RW safety and close proximity to RW.  SPO2 remains above 90% on 3LNC.  Pt 98% post gait training.  HR elevated to 124bpm.     Stairs Stairs: Yes   Stair Management: One rail Right;Forwards Number of Stairs: 2 General stair comments: Cues for sequencing and getting close to the stair before attempting to negotiate.  Pt tolerated well.  Min guard for safety.    Wheelchair Mobility    Modified Rankin (Stroke Patients Only)       Balance                                            Cognition Arousal/Alertness: Awake/alert Behavior During Therapy: WFL for tasks assessed/performed Overall Cognitive Status: Within Functional Limits for tasks assessed                                        Exercises      General Comments        Pertinent Vitals/Pain Pain Assessment: No/denies pain    Home Living                      Prior Function            PT Goals (current goals can now be found in the care plan section) Acute Rehab PT Goals Patient Stated Goal: to go home  Potential to Achieve Goals: Good  Progress towards PT goals: Progressing toward goals    Frequency    Min 3X/week      PT Plan Current plan remains appropriate    Co-evaluation              AM-PAC PT "6 Clicks" Daily Activity  Outcome Measure  Difficulty turning over in bed (including adjusting bedclothes, sheets and blankets)?: None Difficulty moving from lying on back to sitting on the side of the bed? : None Difficulty sitting down on and standing up from a chair with arms (e.g., wheelchair, bedside commode, etc,.)?: A Little Help needed moving to and from a bed to chair (including a wheelchair)?: A Little Help needed walking in hospital room?: A Little Help needed climbing 3-5 steps with a railing? : A Little 6 Click Score: 20    End of Session Equipment  Utilized During Treatment: Gait belt;Oxygen Activity Tolerance: Patient tolerated treatment well Patient left: (in bathroom to have BM, instructed patient to use call light in bathroom when finished.  ) Nurse Communication: Mobility status PT Visit Diagnosis: Unsteadiness on feet (R26.81);Muscle weakness (generalized) (M62.81);History of falling (Z91.81)     Time: 1610-9604 PT Time Calculation (min) (ACUTE ONLY): 23 min  Charges:  $Gait Training: 8-22 mins $Therapeutic Activity: 8-22 mins                    G Codes:       Joycelyn Rua, PTA pager [phone]    Florestine Avers 11/16/2017, 10:07 AM

## 2017-11-17 ENCOUNTER — Other Ambulatory Visit: Payer: Self-pay

## 2017-11-17 DIAGNOSIS — J441 Chronic obstructive pulmonary disease with (acute) exacerbation: Secondary | ICD-10-CM

## 2017-11-17 DIAGNOSIS — R269 Unspecified abnormalities of gait and mobility: Secondary | ICD-10-CM | POA: Diagnosis not present

## 2017-11-17 DIAGNOSIS — J449 Chronic obstructive pulmonary disease, unspecified: Secondary | ICD-10-CM | POA: Diagnosis not present

## 2017-11-18 NOTE — Progress Notes (Signed)
CM called to verify that home oxygen was delivered and to answer any questions that she might have; [place], oxygen was delivered yesterday, no questions at this time; Alexis GoodellB Raidyn Breiner RN,MHA,BSN [phone]

## 2017-11-20 ENCOUNTER — Encounter: Payer: Self-pay | Admitting: Family Medicine

## 2017-11-20 ENCOUNTER — Ambulatory Visit (INDEPENDENT_AMBULATORY_CARE_PROVIDER_SITE_OTHER): Payer: Medicare HMO | Admitting: Family Medicine

## 2017-11-20 VITALS — BP 116/70 | HR 69 | Temp 98.0°F | Wt 154.5 lb

## 2017-11-20 DIAGNOSIS — J432 Centrilobular emphysema: Secondary | ICD-10-CM | POA: Diagnosis not present

## 2017-11-20 DIAGNOSIS — R0602 Shortness of breath: Secondary | ICD-10-CM | POA: Diagnosis not present

## 2017-11-20 DIAGNOSIS — I10 Essential (primary) hypertension: Secondary | ICD-10-CM | POA: Diagnosis not present

## 2017-11-20 DIAGNOSIS — J9601 Acute respiratory failure with hypoxia: Secondary | ICD-10-CM | POA: Diagnosis not present

## 2017-11-20 DIAGNOSIS — G4700 Insomnia, unspecified: Secondary | ICD-10-CM | POA: Diagnosis not present

## 2017-11-20 MED ORDER — FUROSEMIDE 20 MG PO TABS: 20.0000 mg | ORAL_TABLET | ORAL | 0 refills | Status: DC | PRN

## 2017-11-20 MED ORDER — POTASSIUM CHLORIDE ER 20 MEQ PO TBCR: 40.0000 meq | EXTENDED_RELEASE_TABLET | ORAL | 0 refills | Status: DC | PRN

## 2017-11-20 MED ORDER — BENAZEPRIL HCL 5 MG PO TABS: 5.0000 mg | ORAL_TABLET | Freq: Every day | ORAL | 3 refills | Status: DC

## 2017-11-20 NOTE — Progress Notes (Signed)
BP 116/70 (BP Location: Right Arm, Patient Position: Sitting, Cuff Size: Normal)   Pulse 69   Temp 98 F (36.7 C) (Oral)   Wt 154 lb 8 oz (70.1 kg)   SpO2 (!) 89%   BMI 30.17 kg/m    CC: hosp f/u visit Subjective:    Patient ID: [name] [name], female    DOB: December 21, 1945, 72 y.o.   MRN: [id number]  HPI: [name] [name] is a 72 y.o. female presenting on 11/20/2017 for Hospitalization Follow-up ([facility], dx SOB. Needs 30 day rx for potassium and Lasix sent to [facility] [address] and [facility]  Pt accomapanied by daughter, Gerome Apleyattie.)   Recent hospitalization for acute respiratory failure with hypoxia in setting of COPD. Records reviewed. CT angio negative for PE but showed emphysema and possible elevated R heart pressures. Echocardiogram with normal EF (mod AR). Ambulatory pulse ox dropped to 80% so patient was set up with home oxygen at 2L. ACEI stopped due to low blood pressures (she now only takes PRN). ?CHF exacerbation. Discharged with lasix PRN. Describes progressive dyspnea since kyphoplasty.   Denies fevers/chills, cough, wheezing, chest pain. dyspnea has improved - as long as she uses her oxygen. Sleeps with 2 pillows. Leg swelling is improved.   Last seen here 1 month ago, trazodone dose was decreased due to ongoing sedation endorsed by family members. She is doing well on lower dose.   [facility]  PT for lower back pain ended last week.   Ex smoker - quit 5 yrs ago. 46 PY hx Daughter endorses chronic snoring but no witnessed apneic episodes. Pt has more restful sleep when sleeping in recliner, more difficulty with orthopnea.   Admit date: 11/14/2017 Discharge date: 11/16/2017 F/u phone call not performed, however seen within 2 business days of discharge  Recommendations for Outpatient Follow-up:  1. PCP in 1 week, set up with Home O2 for COPD  Discharge Diagnoses:  Principal Problem:   Acute  respiratory failure with hypoxia (HCC) Active Problems:   Hypertension   Peripheral vascular disease (HCC)   COPD (chronic obstructive pulmonary disease) (HCC)   Hemiparesis affecting right side as late effect of stroke (HCC)   Hypothyroidism (acquired)   Closed compression fracture of fourth lumbar vertebra (HCC)   SOB (shortness of breath)  Discharge Condition: stable Diet recommendation: low sodium  Relevant past medical, surgical, family and social history reviewed and updated as indicated. Interim medical history since our last visit reviewed. Allergies and medications reviewed and updated. Outpatient Medications Prior to Visit  Medication Sig Dispense Refill  . acetaminophen (TYLENOL) 500 MG tablet Take 500 mg by mouth 2 (two) times daily as needed for mild pain.     [name]. alendronate (FOSAMAX) 70 MG tablet TAKE 1 TABLET WEEKLY  WITH A FULL GLASS OF WATER ON AN EMPTY STOMACH 12 tablet 3  . aspirin EC 81 MG tablet Take 81 mg by mouth daily.    [name]. atorvastatin (LIPITOR) 40 MG tablet TAKE 1 TABLET (40 MG TOTAL) BY MOUTH DAILY. 90 tablet 0  . Calcium Carb-Cholecalciferol (CALCIUM-VITAMIN D) 600-400 MG-UNIT TABS Take 1 tablet by mouth daily.    . Cholecalciferol (VITAMIN D PO) Take 1 capsule by mouth daily.    . clopidogrel (PLAVIX) 75 MG tablet TAKE 1 TABLET EVERY DAY 90 tablet 0  . fexofenadine (ALLEGRA) 180 MG tablet Take 180 mg by mouth daily as needed for  allergies.     [name] levothyroxine (SYNTHROID, LEVOTHROID) 25 MCG tablet Take 1 tablet (25 mcg total) by mouth daily before breakfast. 90 tablet 1  . sertraline (ZOLOFT) 100 MG tablet Take 1 tablet (100 mg total) by mouth daily. 90 tablet 3  . traMADol (ULTRAM) 50 MG tablet Take 50-100 mg by mouth every 6 (six) hours as needed for moderate pain.     . traZODone (DESYREL) 100 MG tablet Take 1 tablet (100 mg total) by mouth at bedtime. for sleep 90 tablet 1  . benazepril (LOTENSIN) 10 MG tablet Take 1 tablet (10 mg total) by mouth daily.  (Patient taking differently: Take 10 mg by mouth daily. Takes as needed when BP is elevated) 90 tablet 3  . furosemide (LASIX) 20 MG tablet Take 1 tablet (20 mg total) by mouth as needed for fluid or edema. May take once or twice a week for swelling, need to take Potassium whenever you take this 20 tablet 0  . potassium chloride 20 MEQ TBCR Take 40 mEq by mouth as needed (whenever you take lasix). To be taken with lasix 40 tablet 0  . oxyCODONE (OXY IR/ROXICODONE) 5 MG immediate release tablet Take one to two tablets as needed for pain control every 6 hours. (Patient taking differently: Take 5-10 mg by mouth every 6 (six) hours as needed for moderate pain. ) 56 tablet 0   Facility-Administered Medications Prior to Visit  Medication Dose Route Frequency Provider Last Rate Last Dose  . 0.9 %  sodium chloride infusion  500 mL Intravenous Continuous Danis, Starr Lake III, MD         Per HPI unless specifically indicated in ROS section below Review of Systems     Objective:    BP 116/70 (BP Location: Right Arm, Patient Position: Sitting, Cuff Size: Normal)   Pulse 69   Temp 98 F (36.7 C) (Oral)   Wt 154 lb 8 oz (70.1 kg)   SpO2 (!) 89%   BMI 30.17 kg/m   Wt Readings from Last 3 Encounters:  11/20/17 154 lb 8 oz (70.1 kg)  11/16/17 150 lb 1.6 oz (68.1 kg)  10/11/17 161 lb (73 kg)    Physical Exam  Constitutional: She is oriented to person, place, and time. She appears well-developed and well-nourished. No distress.  HENT:  Mouth/Throat: Oropharynx is clear and moist. No oropharyngeal exudate.  Eyes: Conjunctivae and EOM are normal. Pupils are equal, round, and reactive to light.  Cardiovascular: Normal rate, regular rhythm, normal heart sounds and intact distal pulses.  No murmur heard. Pulmonary/Chest: Effort normal and breath sounds normal. No respiratory distress. She has no wheezes. She has no rales.  Musculoskeletal: She exhibits edema (tr).  Neurological: She is alert and oriented  to person, place, and time.  Skin: Skin is warm and dry. No rash noted.  Psychiatric: She has a normal mood and affect.  Nursing note and vitals reviewed.      Assessment & Plan:   Problem List Items Addressed This Visit    Acute respiratory failure with hypoxia (HCC)    New oxygen requirement. Reviewed recent hospitalization. Cardiac evaluation reassuring (cycled troponins, EKG, echocardiogram). CT showed possibly elevated right heart pressures. Presumed COPD related - will refer to pulm for further eval, consideration for PFTs.       Relevant Orders   Ambulatory referral to Pulmonology   COPD (chronic obstructive pulmonary disease) (HCC)    Spirometry from 2014 consistent with moderate obstruction, CT from 06/2017 showing  moderate centrilobular emphysema. Pt never started respiratory controller medication. Will refer to pulm for further evaluation of new onset O2 requirement.       Relevant Orders   Ambulatory referral to Pulmonology   Hypertension    Chronic. Low readings recently - will decrease benazepril to 5mg  daily, hold for hypotension. Continue lasix PRN.       Relevant Medications   furosemide (LASIX) 20 MG tablet   benazepril (LOTENSIN) 5 MG tablet   Insomnia    Doing well on lower trazodone dose - continue.       SOB (shortness of breath) - Primary    Ongoing - see above. Will refer to pulm. Not quite consistent with OSA.       Relevant Orders   Ambulatory referral to Pulmonology       Meds ordered this encounter  Medications  . furosemide (LASIX) 20 MG tablet    Sig: Take 1 tablet (20 mg total) by mouth as needed for fluid or edema. May take once or twice a week for swelling    Dispense:  40 tablet    Refill:  0  . Potassium Chloride ER 20 MEQ TBCR    Sig: Take 40 mEq by mouth as needed (whenever you take lasix). To be taken with lasix    Dispense:  40 tablet    Refill:  0  . benazepril (LOTENSIN) 5 MG tablet    Sig: Take 1 tablet (5 mg total) by  mouth daily.    Dispense:  30 tablet    Refill:  3   Orders Placed This Encounter  Procedures  . Ambulatory referral to Pulmonology    Referral Priority:   Routine    Referral Type:   Consultation    Referral Reason:   Specialty Services Required    Requested Specialty:   Pulmonary Disease    Number of Visits Requested:   1    Follow up plan: Return if symptoms worsen or fail to improve.  [name], MD

## 2017-11-20 NOTE — Assessment & Plan Note (Signed)
Doing well on lower trazodone dose - continue.

## 2017-11-20 NOTE — Assessment & Plan Note (Addendum)
Spirometry from 2014 consistent with moderate obstruction, CT from 06/2017 showing moderate centrilobular emphysema. Pt never started respiratory controller medication. Will refer to pulm for further evaluation of new onset O2 requirement.

## 2017-11-20 NOTE — Patient Instructions (Addendum)
Decrease benazepril to 5mg  daily - hold if low blood pressures. Continue lasix as prescribed - to take as needed for leg swelling.  We will refer you to pulmonologist for evaluation of new oxygen requirement and possible elevated right heart pressures in history of COPD - see our referral coordinator today.

## 2017-11-20 NOTE — Assessment & Plan Note (Signed)
New oxygen requirement. Reviewed recent hospitalization. Cardiac evaluation reassuring (cycled troponins, EKG, echocardiogram). CT showed possibly elevated right heart pressures. Presumed COPD related - will refer to pulm for further eval, consideration for PFTs.

## 2017-11-20 NOTE — Assessment & Plan Note (Signed)
Chronic. Low readings recently - will decrease benazepril to 5mg  daily, hold for hypotension. Continue lasix PRN.

## 2017-11-20 NOTE — Assessment & Plan Note (Addendum)
Ongoing - see above. Will refer to pulm. Not quite consistent with OSA.

## 2017-11-21 ENCOUNTER — Ambulatory Visit: Payer: Medicare HMO | Admitting: Internal Medicine

## 2017-11-21 ENCOUNTER — Encounter: Payer: Self-pay | Admitting: Internal Medicine

## 2017-11-21 VITALS — BP 112/68 | HR 90 | Resp 16 | Ht 60.0 in | Wt 155.0 lb

## 2017-11-21 DIAGNOSIS — J449 Chronic obstructive pulmonary disease, unspecified: Secondary | ICD-10-CM

## 2017-11-21 DIAGNOSIS — M549 Dorsalgia, unspecified: Secondary | ICD-10-CM

## 2017-11-21 DIAGNOSIS — G8929 Other chronic pain: Secondary | ICD-10-CM | POA: Insufficient documentation

## 2017-11-21 DIAGNOSIS — J439 Emphysema, unspecified: Secondary | ICD-10-CM

## 2017-11-21 MED ORDER — ALBUTEROL SULFATE HFA 108 (90 BASE) MCG/ACT IN AERS: 2.0000 | INHALATION_SPRAY | Freq: Four times a day (QID) | RESPIRATORY_TRACT | 2 refills | Status: DC | PRN

## 2017-11-21 MED ORDER — FLUTICASONE-SALMETEROL 115-21 MCG/ACT IN AERO
2.0000 | INHALATION_SPRAY | Freq: Two times a day (BID) | RESPIRATORY_TRACT | 12 refills | Status: DC
Start: 2017-11-21 — End: 2020-01-22

## 2017-11-21 NOTE — Progress Notes (Signed)
[facility] Consultation      Assessment and Plan:  COPD/emphysema-group D with recent exacerbation and dyspnea on exertion. -We will start Advair inhaler. -Encouraged increased physical activity.  Acute on chronic hypoxic respiratory failure, deconditioning - Patient was ambulated today in the clinic, oxygen saturations remained adequate on room air, therefore [name] [name] ambulatory oxygen was discontinued.  I will check an overnight oximetry on room air to see if she still requires nocturnal oxygen, I suspect that she will -We discussed the importance of increasing [name] [name] physical activity, I am referring [name] [name] to pulmonary rehab after performing a pulmonary function test.  Orders Placed This Encounter  Procedures  . AMB referral to pulmonary rehabilitation  . Pulse oximetry, overnight  . Pulmonary Function Test [facility] Only   Meds ordered this encounter  Medications  . fluticasone-salmeterol (ADVAIR HFA) 115-21 MCG/ACT inhaler    Sig: Inhale 2 puffs into the lungs 2 (two) times daily. Rinse mouth after use.    Dispense:  1 Inhaler    Refill:  12  . albuterol (PROVENTIL HFA;VENTOLIN HFA) 108 (90 Base) MCG/ACT inhaler    Sig: Inhale 2 puffs into the lungs every 6 (six) hours as needed for wheezing or shortness of breath.    Dispense:  1 Inhaler    Refill:  2   Return in about 3 months (around 02/18/2018).    Date: 11/21/2017  MRN# [id number] [name] [name] 1946-02-09    [name] [name] is a 72 y.o. old female seen in consultation for chief complaint of:    Chief Complaint  Patient presents with  . Advice Only    Referred by Dr. Karma Ganja for SOB  . Shortness of Breath    [facility]  and uses 2 L close to 24 hrs daily    HPI:   Patient is a 72 year old female smoker with COPD.  She was recently discharged from the hospital on 11/16/17 after 2-day admission for acute hypoxic respiratory failure, likely COPD exacerbation complicating [name] [name] recent  kyphoplasty.  She was ambulated in the hospital and found [name] [name] oxygen saturation dropped to 80% on room air, therefore discharged on ambulatory oxygen.   Since getting out she has continued to use [name] [name] oxygen at 2L at all times including sleep. She feels that [name] [name] breathing is doing ok. She feels that [name] [name] breathing is better since being on the oxygen. She stopped smoking 5 yrs ago, up to 2 ppd.  She is currently on no inhalers, she has not been on any recently. She used to work in a nursing home.  She has a dog all over house, sleeps in [name] [name] bed.  She has sinus drainage which has been worse with the oxygen.  Denies reflux.   **Desat walk at rest on RA, sat is 96% and HR 91walked 180 feet slow pace, mild dyspnea, sat was 89%, HR 111. After sitting for 30 sec, sat came back up to 94%.  **Imaging personally reviewed, CT chest 11/14/17; moderate emphysema strandy bibasilar atelectasis, obesity. **Enrolled in CT lung cancer screening at [facility].   PMHX:   Past Medical History:  Diagnosis Date  . Aneurysm (HCC)   . Anxiety   . Anxiety and depression   . Carotid stenosis    R 50% (12/2012)  . Concussion 08/03/2015  . COPD (chronic obstructive pulmonary disease) (HCC) 12/2012   spirometry: Pre: FVC 84%, FEV1 69%, ratio 0.64 consistent with moderate obstruction.  . Depression   . Fall 08/03/2015  d/c home health 08/2015  . Fracture of cervical vertebra, C5 (HCC) 08/06/2015  . History of chicken pox   . Hyperlipidemia   . Hypertension   . Lower back pain    h/o HNP s/p surgery  . Osteoarthritis    h/o ruptured disc s/p ESI  . Osteoporosis 11/2010   DEXA -2.7 spine, thoracic compression fracture  . Peripheral vascular disease (HCC)   . Smoker    quit 10/2012  . Stroke [facility](HCC) 2010   x3 with residual R hemiparesis, s/p R MCA balloon angioplasty (2010)   Surgical Hx:  Past Surgical History:  Procedure Laterality Date  . APPENDECTOMY  1960  . CATARACT EXTRACTION     bilateral  . CESAREAN SECTION      . CHOLECYSTECTOMY  1970  . COLONOSCOPY  2004   diverticulosis, no polyps Jarold Motto([place])  . COLONOSCOPY  05/2016   decreased sphincter tone, diverticulosis, no f/u recommended ([place])  . DEXA  11/2010   T -2.7 spine, -1.9 hip  . HIP SURGERY Left 2006   fractured - screws placed  . KYPHOPLASTY  10/02/2017   Procedure: LUMBAR FOUR KYPHOPLASTY;  Surgeon: Coletta Memosabbell, Kyle, MD;  [facility];  Service: Neurosurgery;;  . RADIOLOGY WITH ANESTHESIA N/A 11/11/2015   Procedure: RADIOLOGY WITH ANESTHESIA;  Surgeon: Julieanne CottonSanjeev Deveshwar, MD;  Location: [facility] OR;  Service: Radiology;  Laterality: N/A;   Family Hx:  Family History  Problem Relation Age of Onset  . CAD Mother        MI  . Cancer Mother        cervical  . CAD Maternal Aunt        MI  . CAD Sister        MI  . Sudden death Father [id number]       died in his sleep, chain smoker  . Cirrhosis Father   . Hypertension Daughter   . Diabetes Maternal Aunt   . Cancer Maternal Grandmother        cervical   Social Hx:   Social History   Tobacco Use  . Smoking status: Former Smoker    Packs/day: 1.00    Years: 46.00    Pack years: 46.00    Types: Cigarettes    Last attempt to quit: 11/08/2012    Years since quitting: 5.0  . Smokeless tobacco: Never Used  Substance Use Topics  . Alcohol use: No    Alcohol/week: 0.0 oz  . Drug use: No   Medication:    Current Outpatient Medications:  .  acetaminophen (TYLENOL) 500 MG tablet, Take 500 mg by mouth 2 (two) times daily as needed for mild pain. , Disp: , Rfl:  .  alendronate (FOSAMAX) 70 MG tablet, TAKE 1 TABLET WEEKLY  WITH A FULL GLASS OF WATER ON AN EMPTY STOMACH, Disp: 12 tablet, Rfl: 3 .  aspirin EC 81 MG tablet, Take 81 mg by mouth daily., Disp: , Rfl:  .  atorvastatin (LIPITOR) 40 MG tablet, TAKE 1 TABLET (40 MG TOTAL) BY MOUTH DAILY., Disp: 90 tablet, Rfl: 0 .  benazepril (LOTENSIN) 5 MG tablet, Take 1 tablet (5 mg total) by mouth daily., Disp: 30 tablet, Rfl: 3 .  Calcium  Carb-Cholecalciferol (CALCIUM-VITAMIN D) 600-400 MG-UNIT TABS, Take 1 tablet by mouth daily., Disp: , Rfl:  .  Cholecalciferol (VITAMIN D PO), Take 1 capsule by mouth daily., Disp: , Rfl:  .  clopidogrel (PLAVIX) 75 MG tablet, TAKE 1 TABLET EVERY DAY, Disp: 90 tablet, Rfl: 0 .  fexofenadine (ALLEGRA) 180 MG tablet, Take 180 mg by mouth daily as needed for allergies. , Disp: , Rfl:  .  furosemide (LASIX) 20 MG tablet, Take 1 tablet (20 mg total) by mouth as needed for fluid or edema. May take once or twice a week for swelling, Disp: 40 tablet, Rfl: 0 .  levothyroxine (SYNTHROID, LEVOTHROID) 25 MCG tablet, Take 1 tablet (25 mcg total) by mouth daily before breakfast., Disp: 90 tablet, Rfl: 1 .  Potassium Chloride ER 20 MEQ TBCR, Take 40 mEq by mouth as needed (whenever you take lasix). To be taken with lasix, Disp: 40 tablet, Rfl: 0 .  sertraline (ZOLOFT) 100 MG tablet, Take 1 tablet (100 mg total) by mouth daily., Disp: 90 tablet, Rfl: 3 .  traMADol (ULTRAM) 50 MG tablet, Take 50-100 mg by mouth every 6 (six) hours as needed for moderate pain. , Disp: , Rfl:  .  traZODone (DESYREL) 100 MG tablet, Take 1 tablet (100 mg total) by mouth at bedtime. for sleep, Disp: 90 tablet, Rfl: 1  Current Facility-Administered Medications:  .  0.9 %  sodium chloride infusion, 500 mL, Intravenous, Continuous, Danis, Starr Lake III, MD   Allergies:  Doxycycline  Review of Systems: Gen:  Denies  fever, sweats, chills HEENT: Denies blurred vision, double vision. bleeds, sore throat Cvc:  No dizziness, chest pain. Resp:   Denies cough or sputum production, shortness of breath Gi: Denies swallowing difficulty, stomach pain. Gu:  Denies bladder incontinence, burning urine Ext:   No Joint pain, stiffness. Skin: No skin rash,  hives  Endoc:  No polyuria, polydipsia. Psych: No depression, insomnia. Other:  All other systems were reviewed with the patient and were negative other that what is mentioned in the HPI.    Physical Examination:   VS: BP 112/68 (BP Location: Left Arm, Cuff Size: Normal)   Pulse 90   Resp 16   Ht 5' (1.524 m)   Wt 155 lb (70.3 kg)   SpO2 100%   BMI 30.27 kg/m   General Appearance: No distress  Neuro:without focal findings,  speech normal,  HEENT: PERRLA, EOM intact.   Pulmonary: normal breath sounds, No wheezing.  CardiovascularNormal S1,S2.  No m/r/g.   Abdomen: Benign, Soft, non-tender. Renal:  No costovertebral tenderness  GU:  No performed at this time. Endoc: No evident thyromegaly, no signs of acromegaly. Skin:   warm, no rashes, no ecchymosis  Extremities: normal, no cyanosis, clubbing.  Other findings:    LABORATORY PANEL:   CBC Recent Labs  Lab 11/16/17 0450  WBC 5.8  HGB 12.1  HCT 38.3  PLT 183   ------------------------------------------------------------------------------------------------------------------  Chemistries  Recent Labs  Lab 11/16/17 0450  NA 138  K 4.3  CL 103  CO2 24  GLUCOSE 103*  BUN 17  CREATININE 0.87  CALCIUM 9.1   ------------------------------------------------------------------------------------------------------------------  Cardiac Enzymes No results for input(s): TROPONINI in the last 168 hours. ------------------------------------------------------------  RADIOLOGY:  No results found.     Thank  you for the consultation and for allowing [facility] to assist in the care of your patient. Our recommendations are noted above.  Please contact us if we can be of further service.   Wells Guiles, MD.  Board Certified in Internal Medicine, Pulmonary Medicine, Critical Care Medicine, and Sleep Medicine.  [facility] Office Number: [phone]  Santiago Glad, M.D.  Billy Fischer, M.D  11/21/2017

## 2017-11-21 NOTE — Patient Instructions (Signed)
May stop daytime oxygen.  Continue oxygen at night for now, will check overnight oxygen test.  Will check a pulmonary function test, then refer to pulmonary rehab.

## 2017-11-23 ENCOUNTER — Telehealth: Payer: Self-pay | Admitting: Family Medicine

## 2017-11-23 NOTE — Telephone Encounter (Signed)
Copied from CRM [id number]. Topic: Quick Communication - See Telephone Encounter >> Nov 23, 2017 10:01 AM Raquel SarnaHayes, Teresa G wrote: [facility], PT [phone]- [phone]  Order for PT since she was atmitted last week in hospital COPD -  Pt had a PT order, but pt refused PT at home. Pt doesn't want Pt, but  pt is ok w/ skilled nursing to help her.  Needing an order for skilled nursing to see pt for care.  Please call [facility] to confirm order for pt asap.

## 2017-11-23 NOTE — Telephone Encounter (Signed)
Spoke with [name] [name] informing her Dr[facility]G [facility]  Says ok.

## 2017-11-23 NOTE — Telephone Encounter (Signed)
Agree with SN order. Thanks.

## 2017-11-24 ENCOUNTER — Telehealth: Payer: Self-pay

## 2017-11-24 NOTE — Telephone Encounter (Signed)
Copied from CRM [id number]. Topic: Inquiry >> Nov 24, 2017  8:46 AM Everardo PacificMoton, Kelly, NT wrote: Reason for CRM: Celine [facility]Sonja [facility] let Dr.Gutierrez know that the patient has refused skilled nursing, Pt, and home health care. If he has any further questions she can be reached at [phone]

## 2017-11-25 NOTE — Telephone Encounter (Signed)
Noted refusal.

## 2017-11-29 ENCOUNTER — Encounter: Payer: Self-pay | Admitting: Internal Medicine

## 2017-11-29 DIAGNOSIS — J449 Chronic obstructive pulmonary disease, unspecified: Secondary | ICD-10-CM | POA: Diagnosis not present

## 2017-11-29 DIAGNOSIS — J439 Emphysema, unspecified: Secondary | ICD-10-CM

## 2017-11-29 DIAGNOSIS — R0902 Hypoxemia: Secondary | ICD-10-CM | POA: Diagnosis not present

## 2017-11-30 ENCOUNTER — Ambulatory Visit: Payer: Medicare HMO | Attending: Internal Medicine

## 2017-12-01 ENCOUNTER — Telehealth: Payer: Self-pay | Admitting: *Deleted

## 2017-12-01 DIAGNOSIS — J449 Chronic obstructive pulmonary disease, unspecified: Secondary | ICD-10-CM

## 2017-12-01 NOTE — Telephone Encounter (Signed)
Patient aware of results. She is currently on 2L of 02 with sleep. She is aware only 1L needed with sleep. Orders placed Nothing further needed.

## 2017-12-05 ENCOUNTER — Other Ambulatory Visit: Payer: Self-pay

## 2017-12-05 ENCOUNTER — Encounter: Payer: Medicare HMO | Attending: Internal Medicine

## 2017-12-05 VITALS — Ht 61.0 in | Wt 153.8 lb

## 2017-12-05 DIAGNOSIS — J449 Chronic obstructive pulmonary disease, unspecified: Secondary | ICD-10-CM

## 2017-12-05 DIAGNOSIS — J439 Emphysema, unspecified: Secondary | ICD-10-CM | POA: Insufficient documentation

## 2017-12-05 NOTE — Progress Notes (Signed)
Pulmonary Individual Treatment Plan  Patient Details  Name: [name] [name] MRN: [id number] Date of Birth: 08-30-1946 Referring Provider:     Pulmonary Rehab from 12/05/2017 in [facility] Cardiac and Pulmonary Rehab  Referring Provider  Ramachandran      Initial Encounter Date:    Pulmonary Rehab from 12/05/2017 in [facility] Cardiac and Pulmonary Rehab  Date  12/05/17  Referring Provider  Ashby Dawes      Visit Diagnosis: Chronic obstructive pulmonary disease, unspecified COPD type ([place])  Patient's Home Medications on Admission:  Current Outpatient Medications:  .  acetaminophen (TYLENOL) 500 MG tablet, Take 500 mg by mouth 2 (two) times daily as needed for mild pain. , Disp: , Rfl:  .  albuterol (PROVENTIL HFA;VENTOLIN HFA) 108 (90 Base) MCG/ACT inhaler, Inhale 2 puffs into the lungs every 6 (six) hours as needed for wheezing or shortness of breath., Disp: 1 Inhaler, Rfl: 2 .  alendronate (FOSAMAX) 70 MG tablet, TAKE 1 TABLET WEEKLY  WITH A FULL GLASS OF WATER ON AN EMPTY STOMACH, Disp: 12 tablet, Rfl: 3 .  aspirin EC 81 MG tablet, Take 81 mg by mouth daily., Disp: , Rfl:  .  atorvastatin (LIPITOR) 40 MG tablet, TAKE 1 TABLET (40 MG TOTAL) BY MOUTH DAILY., Disp: 90 tablet, Rfl: 0 .  benazepril (LOTENSIN) 5 MG tablet, Take 1 tablet (5 mg total) by mouth daily., Disp: 30 tablet, Rfl: 3 .  Calcium Carb-Cholecalciferol (CALCIUM-VITAMIN D) 600-400 MG-UNIT TABS, Take 1 tablet by mouth daily., Disp: , Rfl:  .  Cholecalciferol (VITAMIN D PO), Take 1 capsule by mouth daily., Disp: , Rfl:  .  clopidogrel (PLAVIX) 75 MG tablet, TAKE 1 TABLET EVERY DAY, Disp: 90 tablet, Rfl: 0 .  fexofenadine (ALLEGRA) 180 MG tablet, Take 180 mg by mouth daily as needed for allergies. , Disp: , Rfl:  .  fluticasone-salmeterol (ADVAIR HFA) 115-21 MCG/ACT inhaler, Inhale 2 puffs into the lungs 2 (two) times daily. Rinse mouth after use., Disp: 1 Inhaler, Rfl: 12 .  furosemide (LASIX) 20 MG tablet, Take 1 tablet (20 mg  total) by mouth as needed for fluid or edema. May take once or twice a week for swelling, Disp: 40 tablet, Rfl: 0 .  levothyroxine (SYNTHROID, LEVOTHROID) 25 MCG tablet, Take 1 tablet (25 mcg total) by mouth daily before breakfast., Disp: 90 tablet, Rfl: 1 .  Potassium Chloride ER 20 MEQ TBCR, Take 40 mEq by mouth as needed (whenever you take lasix). To be taken with lasix, Disp: 40 tablet, Rfl: 0 .  sertraline (ZOLOFT) 100 MG tablet, Take 1 tablet (100 mg total) by mouth daily., Disp: 90 tablet, Rfl: 3 .  traMADol (ULTRAM) 50 MG tablet, Take 50-100 mg by mouth every 6 (six) hours as needed for moderate pain. , Disp: , Rfl:  .  traZODone (DESYREL) 100 MG tablet, Take 1 tablet (100 mg total) by mouth at bedtime. for sleep, Disp: 90 tablet, Rfl: 1  Current Facility-Administered Medications:  .  0.9 %  sodium chloride infusion, 500 mL, Intravenous, Continuous, Danis, Kirke Corin, MD  Past Medical History: Past Medical History:  Diagnosis Date  . Aneurysm ([place])   . Anxiety   . Anxiety and depression   . Carotid stenosis    R 50% (12/2012)  . Concussion 08/03/2015  . COPD (chronic obstructive pulmonary disease) ([place]) 12/2012   spirometry: Pre: FVC 84%, FEV1 69%, ratio 0.64 consistent with moderate obstruction.  . Depression   . Fall 08/03/2015   d/c home health 08/2015  .  Fracture of cervical vertebra, C5 ([facility]) 08/06/2015  . History of chicken pox   . Hyperlipidemia   . Hypertension   . Lower back pain    h/o HNP s/p surgery  . Osteoarthritis    h/o ruptured disc s/p ESI  . Osteoporosis 11/2010   DEXA -2.7 spine, thoracic compression fracture  . Peripheral vascular disease ([place])   . Smoker    quit 10/2012  . Stroke [facility]) 2010   x3 with residual R hemiparesis, s/p R MCA balloon angioplasty (2010)    Tobacco Use: Social History   Tobacco Use  Smoking Status Former Smoker  . Packs/day: 2.00  . Years: 46.00  . Pack years: 92.00  . Types: Cigarettes  . Last attempt to quit:  11/08/2012  . Years since quitting: 5.0  Smokeless Tobacco Never Used    Labs: Recent Review Flowsheet Data    Labs for ITP Cardiac and Pulmonary Rehab Latest Ref Rng & Units 12/29/2014 01/29/2015 01/25/2016 06/26/2017 09/11/2017   Cholestrol 0 - 200 mg/dL - 177 167 188 -   LDLCALC 0 - 99 mg/dL - 96 91 107(H) -   LDLDIRECT mg/dL - - - - 84.0   HDL >39.00 mg/dL - 58.70 58.40 65.80 -   Trlycerides 0.0 - 149.0 mg/dL - 110.0 86.0 78.0 -   Hemoglobin A1c 4.6 - 6.1 % - - - - -   TCO2 0 - 100 mmol/L 22 - - - -       Pulmonary Assessment Scores: Pulmonary Assessment Scores    Row Name 12/05/17 1112         ADL UCSD   ADL Phase  Entry     SOB Score total  99     Rest  0     Walk  3     Stairs  5     Bath  5     Dress  5     Shop  5       CAT Score   CAT Score  25       mMRC Score   mMRC Score  2        Pulmonary Function Assessment: Pulmonary Function Assessment - 12/05/17 1116      Initial Spirometry Results   FVC%  64 %    FEV1%  63 %    FEV1/FVC Ratio  73.77    Comments  good patient effort      Post Bronchodilator Spirometry Results   FVC%  62.08 %    FEV1%  66.15 %    FEV1/FVC Ratio  80.48    Comments  good patient effort      Breath   Bilateral Breath Sounds  Clear    Shortness of Breath  Limiting activity;Yes       Exercise Target Goals: Date: 12/05/17  Exercise Program Goal: Individual exercise prescription set using results from initial 6 min walk test and THRR while considering  patient's activity barriers and safety.    Exercise Prescription Goal: Initial exercise prescription builds to 30-45 minutes a day of aerobic activity, 2-3 days per week.  Home exercise guidelines will be given to patient during program as part of exercise prescription that the participant will acknowledge.  Activity Barriers & Risk Stratification:   6 Minute Walk: 6 Minute Walk    Row Name 12/05/17 1219         6 Minute Walk   Phase  Initial     Distance  765 feet  Walk Time  6 minutes     # of Rest Breaks  0     MPH  1.45     METS  1.97     RPE  13     Perceived Dyspnea   3     Symptoms  Yes (comment)     Comments  pain outside of R leg - pt states due to stroke     Resting HR  95 bpm     Resting BP  120/68     Resting Oxygen Saturation   93 %     Exercise Oxygen Saturation  during 6 min walk  98 %     Max Ex. HR  126 bpm     Max Ex. BP  132/84     2 Minute Post BP  106/70       Oxygen Initial Assessment: Oxygen Initial Assessment - 12/05/17 1114      Home Oxygen   Home Oxygen Device  Home Concentrator;E-Tanks    Sleep Oxygen Prescription  Continuous    Liters per minute  1    Home Exercise Oxygen Prescription  None    Home at Rest Exercise Oxygen Prescription  None    Compliance with Home Oxygen Use  Yes      Initial 6 min Walk   Oxygen Used  None      Program Oxygen Prescription   Program Oxygen Prescription  None      Intervention   Short Term Goals  To learn and exhibit compliance with exercise, home and travel O2 prescription;To learn and understand importance of maintaining oxygen saturations>88%;To learn and demonstrate proper use of respiratory medications;To learn and demonstrate proper pursed lip breathing techniques or other breathing techniques.;To learn and understand importance of monitoring SPO2 with pulse oximeter and demonstrate accurate use of the pulse oximeter.    Long  Term Goals  Exhibits compliance with exercise, home and travel O2 prescription;Verbalizes importance of monitoring SPO2 with pulse oximeter and return demonstration;Maintenance of O2 saturations>88%;Exhibits proper breathing techniques, such as pursed lip breathing or other method taught during program session;Compliance with respiratory medication;Demonstrates proper use of MDI's       Oxygen Re-Evaluation:   Oxygen Discharge (Final Oxygen Re-Evaluation):   Initial Exercise Prescription: Initial Exercise Prescription - 12/05/17 1200       Date of Initial Exercise RX and Referring Provider   Date  12/05/17    Referring Provider  Ashby Dawes      Treadmill   MPH  1    Grade  0    Minutes  15    METs  1.77      NuStep   Level  1    SPM  80    Minutes  15    METs  1.7      Biostep-RELP   Level  1    SPM  50    Minutes  15    METs  1.7      Track   Laps  17    Minutes  15    METs  1.7      Prescription Details   Frequency (times per week)  3    Duration  Progress to 45 minutes of aerobic exercise without signs/symptoms of physical distress      Intensity   THRR 40-80% of Max Heartrate  116-138    Ratings of Perceived Exertion  11-15    Perceived Dyspnea  0-4  Resistance Training   Training Prescription  Yes    Weight  2 lb    Reps  10-15       Perform Capillary Blood Glucose checks as needed.  Exercise Prescription Changes:   Exercise Comments:   Exercise Goals and Review:   Exercise Goals Re-Evaluation :   Discharge Exercise Prescription (Final Exercise Prescription Changes):   Nutrition:  Target Goals: Understanding of nutrition guidelines, daily intake of sodium '1500mg'$ , cholesterol '200mg'$ , calories 30% from fat and 7% or less from saturated fats, daily to have 5 or more servings of fruits and vegetables.  Biometrics: Pre Biometrics - 12/05/17 1217      Pre Biometrics   Height  '5\' 1"'$  (1.549 m)    Weight  153 lb 12.8 oz (69.8 kg)    Waist Circumference  35.5 inches    Hip Circumference  46.5 inches    Waist to Hip Ratio  0.76 %    BMI (Calculated)  29.08        Nutrition Therapy Plan and Nutrition Goals: Nutrition Therapy & Goals - 12/05/17 1112      Personal Nutrition Goals   Comments  Lose weight, Learn healthier eating habits.      Intervention Plan   Intervention  Prescribe, educate and counsel regarding individualized specific dietary modifications aiming towards targeted core components such as weight, hypertension, lipid management, diabetes, heart failure  and other comorbidities.;Nutrition handout(s) given to patient.    Expected Outcomes  Short Term Goal: Understand basic principles of dietary content, such as calories, fat, sodium, cholesterol and nutrients.;Long Term Goal: Adherence to prescribed nutrition plan.       Nutrition Assessments:   Nutrition Goals Re-Evaluation:   Nutrition Goals Discharge (Final Nutrition Goals Re-Evaluation):   Psychosocial: Target Goals: Acknowledge presence or absence of significant depression and/or stress, maximize coping skills, provide positive support system. Participant is able to verbalize types and ability to use techniques and skills needed for reducing stress and depression.   Initial Review & Psychosocial Screening: Initial Psych Review & Screening - 12/05/17 1110      Initial Review   Current issues with  Current Depression;History of Depression;Current Psychotropic Meds;Current Sleep Concerns;Current Stress Concerns    Source of Stress Concerns  Chronic Illness;Unable to perform yard/household activities;Unable to participate in former interests or hobbies    Comments  She takes Zoloft to help her depression.      Family Dynamics   Good Support System?  Yes    Comments  Looks to her daughters and grandaughters for support      Barriers   Psychosocial barriers to participate in program  The patient should benefit from training in stress management and relaxation.      Screening Interventions   Interventions  Encouraged to exercise;Provide feedback about the scores to participant;Program counselor consult;To provide support and resources with identified psychosocial needs    Expected Outcomes  Short Term goal: Utilizing psychosocial counselor, staff and physician to assist with identification of specific Stressors or current issues interfering with healing process. Setting desired goal for each stressor or current issue identified.;Long Term Goal: Stressors or current issues are  controlled or eliminated.;Short Term goal: Identification and review with participant of any Quality of Life or Depression concerns found by scoring the questionnaire.;Long Term goal: The participant improves quality of Life and PHQ9 Scores as seen by post scores and/or verbalization of changes       Quality of Life Scores:  Scores of 19 and below  usually indicate a poorer quality of life in these areas.  A difference of  2-3 points is a clinically meaningful difference.  A difference of 2-3 points in the total score of the Quality of Life Index has been associated with significant improvement in overall quality of life, self-image, physical symptoms, and general health in studies assessing change in quality of life.  PHQ-9: Recent Review Flowsheet Data    Depression screen [facility] 2/9 12/05/2017 06/26/2017 02/01/2016 01/29/2015 01/02/2014   Decreased Interest 1 0 0 0 1   Down, Depressed, Hopeless 2 1 0 3 1   PHQ - 2 Score 3 1 0 3 2   Altered sleeping 2 1 - 3  1   Tired, decreased energy 3 0 - 3 1   Change in appetite 2 0 - 0  1   Feeling bad or failure about yourself  2 0 - 3  0   Trouble concentrating 1 0 - 1 1   Moving slowly or fidgety/restless 2 0 - 3  0   Suicidal thoughts 0 0 - 0 3   PHQ-9 Score 15 2 - 16 9   Difficult doing work/chores Somewhat difficult Not difficult at all - Somewhat difficult -     Interpretation of Total Score  Total Score Depression Severity:  1-4 = Minimal depression, 5-9 = Mild depression, 10-14 = Moderate depression, 15-19 = Moderately severe depression, 20-27 = Severe depression   Psychosocial Evaluation and Intervention:   Psychosocial Re-Evaluation:   Psychosocial Discharge (Final Psychosocial Re-Evaluation):   Education: Education Goals: Education classes will be provided on a weekly basis, covering required topics. Participant will state understanding/return demonstration of topics presented.  Learning Barriers/Preferences: Learning  Barriers/Preferences - 12/05/17 1114      Learning Barriers/Preferences   Learning Barriers  Sight wears reading glasses    Learning Preferences  None       Education Topics:  Initial Evaluation Education: - Verbal, written and demonstration of respiratory meds, oximetry and breathing techniques. Instruction on use of nebulizers and MDIs and importance of monitoring MDI activations.   Pulmonary Rehab from 12/05/2017 in [facility] Cardiac and Pulmonary Rehab  Date  12/05/17  Educator  [facility]  Instruction Review Code  1- Verbalizes Understanding      General Nutrition Guidelines/Fats and Fiber: -Group instruction provided by verbal, written material, models and posters to present the general guidelines for heart healthy nutrition. Gives an explanation and review of dietary fats and fiber.   Controlling Sodium/Reading Food Labels: -Group verbal and written material supporting the discussion of sodium use in heart healthy nutrition. Review and explanation with models, verbal and written materials for utilization of the food label.   Exercise Physiology & General Exercise Guidelines: - Group verbal and written instruction with models to review the exercise physiology of the cardiovascular system and associated critical values. Provides general exercise guidelines with specific guidelines to those with heart or lung disease.    Aerobic Exercise & Resistance Training: - Gives group verbal and written instruction on the various components of exercise. Focuses on aerobic and resistive training programs and the benefits of this training and how to safely progress through these programs.   Flexibility, Balance, Mind/Body Relaxation: Provides group verbal/written instruction on the benefits of flexibility and balance training, including mind/body exercise modes such as yoga, pilates and tai chi.  Demonstration and skill practice provided.   Stress and Anxiety: - Provides group verbal and written  instruction about the health risks of elevated stress  and causes of high stress.  Discuss the correlation between heart/lung disease and anxiety and treatment options. Review healthy ways to manage with stress and anxiety.   Depression: - Provides group verbal and written instruction on the correlation between heart/lung disease and depressed mood, treatment options, and the stigmas associated with seeking treatment.   Exercise & Equipment Safety: - Individual verbal instruction and demonstration of equipment use and safety with use of the equipment.   Pulmonary Rehab from 12/05/2017 in [facility] Cardiac and Pulmonary Rehab  Date  12/05/17  Educator  [facility]  Instruction Review Code  1- Verbalizes Understanding      Infection Prevention: - Provides verbal and written material to individual with discussion of infection control including proper hand washing and proper equipment cleaning during exercise session.   Pulmonary Rehab from 12/05/2017 in [facility] Cardiac and Pulmonary Rehab  Date  12/05/17  Educator  [facility]  Instruction Review Code  1- Verbalizes Understanding      Falls Prevention: - Provides verbal and written material to individual with discussion of falls prevention and safety.   Pulmonary Rehab from 12/05/2017 in [facility] Cardiac and Pulmonary Rehab  Date  12/05/17  Educator  [facility]  Instruction Review Code  1- Verbalizes Understanding      Diabetes: - Individual verbal and written instruction to review signs/symptoms of diabetes, desired ranges of glucose level fasting, after meals and with exercise. Advice that pre and post exercise glucose checks will be done for 3 sessions at entry of program.   Chronic Lung Diseases: - Group verbal and written instruction to review updates, respiratory medications, advancements in procedures and treatments. Discuss use of supplemental oxygen including available portable oxygen systems, continuous and intermittent flow rates, concentrators, personal use  and safety guidelines. Review proper use of inhaler and spacers. Provide informative websites for self-education.    Energy Conservation: - Provide group verbal and written instruction for methods to conserve energy, plan and organize activities. Instruct on pacing techniques, use of adaptive equipment and posture/positioning to relieve shortness of breath.   Triggers and Exacerbations: - Group verbal and written instruction to review types of environmental triggers and ways to prevent exacerbations. Discuss weather changes, air quality and the benefits of nasal washing. Review warning signs and symptoms to help prevent infections. Discuss techniques for effective airway clearance, coughing, and vibrations.   AED/CPR: - Group verbal and written instruction with the use of models to demonstrate the basic use of the AED with the basic ABC's of resuscitation.   Anatomy and Physiology of the Lungs: - Group verbal and written instruction with the use of models to provide basic lung anatomy and physiology related to function, structure and complications of lung disease.   Anatomy & Physiology of the Heart: - Group verbal and written instruction and models provide basic cardiac anatomy and physiology, with the coronary electrical and arterial systems. Review of Valvular disease and Heart Failure   Cardiac Medications: - Group verbal and written instruction to review commonly prescribed medications for heart disease. Reviews the medication, class of the drug, and side effects.   Know Your Numbers and Risk Factors: -Group verbal and written instruction about important numbers in your health.  Discussion of what are risk factors and how they play a role in the disease process.  Review of Cholesterol, Blood Pressure, Diabetes, and BMI and the role they play in your overall health.   Sleep Hygiene: -Provides group verbal and written instruction about how sleep can affect your health.  Define  sleep hygiene, discuss sleep cycles and impact of sleep habits. Review good sleep hygiene tips.    Other: -Provides group and verbal instruction on various topics (see comments)    Knowledge Questionnaire Score: Knowledge Questionnaire Score - 12/05/17 1114      Knowledge Questionnaire Score   Pre Score  15/18 Reviewed with patient        Core Components/Risk Factors/Patient Goals at Admission: Personal Goals and Risk Factors at Admission - 12/05/17 1115      Core Components/Risk Factors/Patient Goals on Admission    Weight Management  Yes;Weight Maintenance;Weight Loss    Intervention  Weight Management: Develop a combined nutrition and exercise program designed to reach desired caloric intake, while maintaining appropriate intake of nutrient and fiber, sodium and fats, and appropriate energy expenditure required for the weight goal.;Weight Management: Provide education and appropriate resources to help participant work on and attain dietary goals.;Weight Management/Obesity: Establish reasonable short term and long term weight goals.    Admit Weight  153 lb 12.8 oz (69.8 kg)    Goal Weight: Short Term  148 lb (67.1 kg)    Goal Weight: Long Term  130 lb (59 kg)    Expected Outcomes  Short Term: Continue to assess and modify interventions until short term weight is achieved;Long Term: Adherence to nutrition and physical activity/exercise program aimed toward attainment of established weight goal;Weight Maintenance: Understanding of the daily nutrition guidelines, which includes 25-35% calories from fat, 7% or less cal from saturated fats, less than '200mg'$  cholesterol, less than 1.5gm of sodium, & 5 or more servings of fruits and vegetables daily;Weight Loss: Understanding of general recommendations for a balanced deficit meal plan, which promotes 1-2 lb weight loss per week and includes a negative energy balance of [phone] kcal/d;Understanding recommendations for meals to include 15-35%  energy as protein, 25-35% energy from fat, 35-60% energy from carbohydrates, less than '200mg'$  of dietary cholesterol, 20-35 gm of total fiber daily;Understanding of distribution of calorie intake throughout the day with the consumption of 4-5 meals/snacks    Improve shortness of breath with ADL's  Yes    Intervention  Provide education, individualized exercise plan and daily activity instruction to help decrease symptoms of SOB with activities of daily living.    Expected Outcomes  Short Term: Improve cardiorespiratory fitness to achieve a reduction of symptoms when performing ADLs;Long Term: Be able to perform more ADLs without symptoms or delay the onset of symptoms    Heart Failure  Yes    Intervention  Provide a combined exercise and nutrition program that is supplemented with education, support and counseling about heart failure. Directed toward relieving symptoms such as shortness of breath, decreased exercise tolerance, and extremity edema.    Expected Outcomes  Improve functional capacity of life;Short term: Attendance in program 2-3 days a week with increased exercise capacity. Reported lower sodium intake. Reported increased fruit and vegetable intake. Reports medication compliance.;Short term: Daily weights obtained and reported for increase. Utilizing diuretic protocols set by physician.;Long term: Adoption of self-care skills and reduction of barriers for early signs and symptoms recognition and intervention leading to self-care maintenance.    Hypertension  Yes    Intervention  Provide education on lifestyle modifcations including regular physical activity/exercise, weight management, moderate sodium restriction and increased consumption of fresh fruit, vegetables, and low fat dairy, alcohol moderation, and smoking cessation.;Monitor prescription use compliance.    Expected Outcomes  Short Term: Continued assessment and intervention until BP is < 140/42m HG in  hypertensive participants. <  130/15m HG in hypertensive participants with diabetes, heart failure or chronic kidney disease.;Long Term: Maintenance of blood pressure at goal levels.       Core Components/Risk Factors/Patient Goals Review:    Core Components/Risk Factors/Patient Goals at Discharge (Final Review):    ITP Comments: ITP Comments    Row Name 12/05/17 1045           ITP Comments  Medical Evaluation completed. Chart sent for review and changes to Dr. MEmily FilbertDirector of [facility]ake [facility] can be found in [facility]          Comments: Initial ITP

## 2017-12-05 NOTE — Patient Outreach (Signed)
[facility] Care Management  12/05/2017  [name] McgregorHattie S [name] 20-Apr-1946 [id number]   EMMI: COPD Red alert Referral date: 12/05/17 Referral reason: Questions/ problems with medications: YES Day # 11  Telephone call to patient regarding EMMI COPD red alert. HIPAA verified with patient. Discussed EMMI COPD program with patient.  Patient states she is not having any problems with her medications. Patient states she has all of her medications and is taking them as prescribed. Patient states she is using her oxygen only at night as recommended by her doctor at 1 L.  Patient reports she has seen her primary MD and pulmonologist.  Patient states she started pulmonary rehabilitation today.  Patient denies having any increase shortness of breath or congestion.  RNCM discussed COPD actions plan/ zones with patient.  Patient states her daughter provides her transportation to her doctors appointments.  Patient states she has good family support and has life line access if needed.  Patient denies having any needs or concerns at this time.  RNCM advised patient to notify MD of any changes in condition prior to scheduled appointment. RNCM provided contact name and number for 24 hour nurse advise line [phone].  RNCM verified patient aware of 911 services for urgent/ emergent needs.   PLAN:  RNCM will refer patient to care management assistant to close due to patient being assessed and having no further needs.   George InaDavina Jeromiah Ohalloran RN,BSN,CCM [facility] Telephonic  [phone]

## 2017-12-05 NOTE — Patient Outreach (Signed)
Patient triggered Red on EMMI COPD Dashboard, notification sent to George Inaavina Green, RN

## 2017-12-05 NOTE — Patient Instructions (Signed)
Patient Instructions  Patient Details  Name: [name] [name] MRN: [id number] Date of Birth: 1946/05/14 Referring Provider:  Shane Crutch, *  Below are your personal goals for exercise, nutrition, and risk factors. Our goal is to help you stay on track towards obtaining and maintaining these goals. We will be discussing your progress on these goals with you throughout the program.  Initial Exercise Prescription: Initial Exercise Prescription - 12/05/17 1200      Date of Initial Exercise RX and Referring Provider   Date  12/05/17    Referring Provider  Nicholos Johns      Treadmill   MPH  1    Grade  0    Minutes  15    METs  1.77      NuStep   Level  1    SPM  80    Minutes  15    METs  1.7      Biostep-RELP   Level  1    SPM  50    Minutes  15    METs  1.7      Track   Laps  17    Minutes  15    METs  1.7      Prescription Details   Frequency (times per week)  3    Duration  Progress to 45 minutes of aerobic exercise without signs/symptoms of physical distress      Intensity   THRR 40-80% of Max Heartrate  116-138    Ratings of Perceived Exertion  11-15    Perceived Dyspnea  0-4      Resistance Training   Training Prescription  Yes    Weight  2 lb    Reps  10-15       Exercise Goals: Frequency: Be able to perform aerobic exercise two to three times per week in program working toward 2-5 days per week of home exercise.  Intensity: Work with a perceived exertion of 11 (fairly light) - 15 (hard) while following your exercise prescription.  We will make changes to your prescription with you as you progress through the program.   Duration: Be able to do 30 to 45 minutes of continuous aerobic exercise in addition to a 5 minute warm-up and a 5 minute cool-down routine.   Nutrition Goals: Your personal nutrition goals will be established when you do your nutrition analysis with the dietician.  The following are general nutrition guidelines to  follow: Cholesterol < 200mg /day Sodium < 1500mg /day Fiber: Women over 50 yrs - 21 grams per day  Personal Goals: Personal Goals and Risk Factors at Admission - 12/05/17 1115      Core Components/Risk Factors/Patient Goals on Admission    Weight Management  Yes;Weight Maintenance;Weight Loss    Intervention  Weight Management: Develop a combined nutrition and exercise program designed to reach desired caloric intake, while maintaining appropriate intake of nutrient and fiber, sodium and fats, and appropriate energy expenditure required for the weight goal.;Weight Management: Provide education and appropriate resources to help participant work on and attain dietary goals.;Weight Management/Obesity: Establish reasonable short term and long term weight goals.    Admit Weight  153 lb 12.8 oz (69.8 kg)    Goal Weight: Short Term  148 lb (67.1 kg)    Goal Weight: Long Term  130 lb (59 kg)    Expected Outcomes  Short Term: Continue to assess and modify interventions until short term weight is achieved;Long Term: Adherence to nutrition and physical activity/exercise program  aimed toward attainment of established weight goal;Weight Maintenance: Understanding of the daily nutrition guidelines, which includes 25-35% calories from fat, 7% or less cal from saturated fats, less than 200mg  cholesterol, less than 1.5gm of sodium, & 5 or more servings of fruits and vegetables daily;Weight Loss: Understanding of general recommendations for a balanced deficit meal plan, which promotes 1-2 lb weight loss per week and includes a negative energy balance of [phone] kcal/d;Understanding recommendations for meals to include 15-35% energy as protein, 25-35% energy from fat, 35-60% energy from carbohydrates, less than 200mg  of dietary cholesterol, 20-35 gm of total fiber daily;Understanding of distribution of calorie intake throughout the day with the consumption of 4-5 meals/snacks    Improve shortness of breath with ADL's   Yes    Intervention  Provide education, individualized exercise plan and daily activity instruction to help decrease symptoms of SOB with activities of daily living.    Expected Outcomes  Short Term: Improve cardiorespiratory fitness to achieve a reduction of symptoms when performing ADLs;Long Term: Be able to perform more ADLs without symptoms or delay the onset of symptoms    Heart Failure  Yes    Intervention  Provide a combined exercise and nutrition program that is supplemented with education, support and counseling about heart failure. Directed toward relieving symptoms such as shortness of breath, decreased exercise tolerance, and extremity edema.    Expected Outcomes  Improve functional capacity of life;Short term: Attendance in program 2-3 days a week with increased exercise capacity. Reported lower sodium intake. Reported increased fruit and vegetable intake. Reports medication compliance.;Short term: Daily weights obtained and reported for increase. Utilizing diuretic protocols set by physician.;Long term: Adoption of self-care skills and reduction of barriers for early signs and symptoms recognition and intervention leading to self-care maintenance.    Hypertension  Yes    Intervention  Provide education on lifestyle modifcations including regular physical activity/exercise, weight management, moderate sodium restriction and increased consumption of fresh fruit, vegetables, and low fat dairy, alcohol moderation, and smoking cessation.;Monitor prescription use compliance.    Expected Outcomes  Short Term: Continued assessment and intervention until BP is < 140/8590mm HG in hypertensive participants. < 130/3280mm HG in hypertensive participants with diabetes, heart failure or chronic kidney disease.;Long Term: Maintenance of blood pressure at goal levels.       Tobacco Use Initial Evaluation: Social History   Tobacco Use  Smoking Status Former Smoker  . Packs/day: 2.00  . Years: 46.00  .  Pack years: 92.00  . Types: Cigarettes  . Last attempt to quit: 11/08/2012  . Years since quitting: 5.0  Smokeless Tobacco Never Used    Exercise Goals and Review:   Copy of goals given to participant.

## 2017-12-13 ENCOUNTER — Other Ambulatory Visit (HOSPITAL_COMMUNITY): Payer: Self-pay | Admitting: Interventional Radiology

## 2017-12-13 DIAGNOSIS — J439 Emphysema, unspecified: Secondary | ICD-10-CM | POA: Diagnosis not present

## 2017-12-13 DIAGNOSIS — J449 Chronic obstructive pulmonary disease, unspecified: Secondary | ICD-10-CM

## 2017-12-13 NOTE — Progress Notes (Signed)
Daily Session Note  Patient Details  Name: [name] [name] MRN: [id number] Date of Birth: January 23, 1946 Referring Provider:     Pulmonary Rehab from 12/05/2017 in [facility] Cardiac and Pulmonary Rehab  Referring Provider  Ramachandran      Encounter Date: 12/13/2017  Check In: Session Check In - 12/13/17 0945      Check-In   Location  [facility]-Cardiac & Pulmonary Rehab    Staff Present  Nada Maclachlan, BA, ACSM CEP, Exercise Physiologist;Jessica Luan Pulling, MA, RCEP, CCRP, Exercise Physiologist;Ellowyn Rieves Flavia Shipper    Supervising physician immediately available to respond to emergencies  [facility] immediately available ER MD    Physician(s)  Dr. Joni Fears and Jimmye Norman    Medication changes reported      No    Fall or balance concerns reported     No    Tobacco Cessation  No Change    Warm-up and Cool-down  Performed as group-led instruction    Resistance Training Performed  Yes    VAD Patient?  No      Pain Assessment   Currently in Pain?  No/denies          Social History   Tobacco Use  Smoking Status Former Smoker  . Packs/day: 2.00  . Years: 46.00  . Pack years: 92.00  . Types: Cigarettes  . Last attempt to quit: 11/08/2012  . Years since quitting: 5.0  Smokeless Tobacco Never Used    Goals Met:  Exercise tolerated well Personal goals reviewed Queuing for purse lip breathing No report of cardiac concerns or symptoms Strength training completed today  Goals Unmet:  Not Applicable  Comments: First full day of exercise!  Patient was oriented to gym and equipment including functions, settings, policies, and procedures.  Patient's individual exercise prescription and treatment plan were reviewed.  All starting workloads were established based on the results of the 6 minute walk test done at initial orientation visit.  The plan for exercise progression was also introduced and progression will be customized based on patient's performance and goals.   Dr. Emily Filbert is  Medical Director for [place] and [facility].

## 2017-12-15 ENCOUNTER — Encounter: Payer: Medicare HMO | Admitting: *Deleted

## 2017-12-15 DIAGNOSIS — R269 Unspecified abnormalities of gait and mobility: Secondary | ICD-10-CM | POA: Diagnosis not present

## 2017-12-15 DIAGNOSIS — J439 Emphysema, unspecified: Secondary | ICD-10-CM | POA: Diagnosis not present

## 2017-12-15 DIAGNOSIS — J449 Chronic obstructive pulmonary disease, unspecified: Secondary | ICD-10-CM

## 2017-12-15 NOTE — Progress Notes (Signed)
Daily Session Note  Patient Details  Name: [name] [name] MRN: [id number] Date of Birth: 15-Jan-1946 Referring Provider:     Pulmonary Rehab from 12/05/2017 in [facility] Cardiac and Pulmonary Rehab  Referring Provider  Ramachandran      Encounter Date: 12/15/2017  Check In: Session Check In - 12/15/17 1010      Check-In   Location  [facility]-Cardiac & Pulmonary Rehab    Staff Present  Renita Papa, RN Vickki Hearing, BA, ACSM CEP, Exercise Physiologist;Joseph Flavia Shipper    Supervising physician immediately available to respond to emergencies  [facility] immediately available ER MD    Physician(s)  Drs. Joni Fears and [place]    Medication changes reported      No    Fall or balance concerns reported     No    Warm-up and Cool-down  Performed as group-led Higher education careers adviser Performed  Yes    VAD Patient?  No      Pain Assessment   Currently in Pain?  No/denies          Social History   Tobacco Use  Smoking Status Former Smoker  . Packs/day: 2.00  . Years: 46.00  . Pack years: 92.00  . Types: Cigarettes  . Last attempt to quit: 11/08/2012  . Years since quitting: 5.1  Smokeless Tobacco Never Used    Goals Met:  Proper associated with RPD/PD & O2 Sat Independence with exercise equipment Using PLB without cueing & demonstrates good technique Exercise tolerated well No report of cardiac concerns or symptoms Strength training completed today  Goals Unmet:  Not Applicable  Comments: Pt able to follow exercise prescription today without complaint.  Will continue to monitor for progression.    Dr. Emily Filbert is Medical Director for [place] and [facility].

## 2017-12-18 ENCOUNTER — Other Ambulatory Visit (HOSPITAL_COMMUNITY): Payer: Self-pay | Admitting: Interventional Radiology

## 2017-12-18 DIAGNOSIS — R42 Dizziness and giddiness: Secondary | ICD-10-CM

## 2017-12-18 DIAGNOSIS — R531 Weakness: Secondary | ICD-10-CM

## 2017-12-18 DIAGNOSIS — R4781 Slurred speech: Secondary | ICD-10-CM

## 2017-12-18 DIAGNOSIS — I771 Stricture of artery: Secondary | ICD-10-CM

## 2017-12-18 DIAGNOSIS — I639 Cerebral infarction, unspecified: Secondary | ICD-10-CM

## 2017-12-20 DIAGNOSIS — J439 Emphysema, unspecified: Secondary | ICD-10-CM | POA: Diagnosis not present

## 2017-12-20 DIAGNOSIS — J449 Chronic obstructive pulmonary disease, unspecified: Secondary | ICD-10-CM

## 2017-12-20 NOTE — Progress Notes (Signed)
Daily Session Note  Patient Details  Name: [name] [name] MRN: [id number] Date of Birth: 04-09-1946 Referring Provider:     Pulmonary Rehab from 12/05/2017 in [facility] Cardiac and Pulmonary Rehab  Referring Provider  Ramachandran      Encounter Date: 12/20/2017  Check In: Session Check In - 12/20/17 1013      Check-In   Location  [facility]-Cardiac & Pulmonary Rehab    Staff Present  Justin Mend RCP,RRT,BSRT;Amanda Oletta Darter, BA, ACSM CEP, Exercise Physiologist;Jessica Luan Pulling, [place], RCEP, CCRP, Exercise Physiologist    Supervising physician immediately available to respond to emergencies  [facility] immediately available ER MD    Physician(s)  Dr. Mariea Clonts and Jimmye Norman    Medication changes reported      No    Fall or balance concerns reported     No    Tobacco Cessation  No Change    Warm-up and Cool-down  Performed as group-led instruction    Resistance Training Performed  Yes    VAD Patient?  No      Pain Assessment   Currently in Pain?  No/denies          Social History   Tobacco Use  Smoking Status Former Smoker  . Packs/day: 2.00  . Years: 46.00  . Pack years: 92.00  . Types: Cigarettes  . Last attempt to quit: 11/08/2012  . Years since quitting: 5.1  Smokeless Tobacco Never Used    Goals Met:  Independence with exercise equipment Exercise tolerated well No report of cardiac concerns or symptoms Strength training completed today  Goals Unmet:  Not Applicable  Comments: Pt able to follow exercise prescription today without complaint.  Will continue to monitor for progression.   Dr. Emily Filbert is Medical Director for [place] and [facility].

## 2017-12-21 ENCOUNTER — Ambulatory Visit (HOSPITAL_COMMUNITY): Payer: Medicare HMO

## 2017-12-22 ENCOUNTER — Encounter: Payer: Medicare HMO | Admitting: *Deleted

## 2017-12-22 DIAGNOSIS — J449 Chronic obstructive pulmonary disease, unspecified: Secondary | ICD-10-CM

## 2017-12-22 DIAGNOSIS — J439 Emphysema, unspecified: Secondary | ICD-10-CM | POA: Diagnosis not present

## 2017-12-22 NOTE — Progress Notes (Signed)
Daily Session Note  Patient Details  Name: [name] [name] MRN: [id number] Date of Birth: 04-Mar-1946 Referring Provider:     Pulmonary Rehab from 12/05/2017 in [facility] Cardiac and Pulmonary Rehab  Referring Provider  Ramachandran      Encounter Date: 12/22/2017  Check In: Session Check In - 12/22/17 1021      Check-In   Location  [facility]-Cardiac & Pulmonary Rehab    Staff Present  Nyoka Cowden, RN, BSN, MA;Makayla Lanter Sherryll Burger, RN Vickki Hearing, BA, ACSM CEP, Exercise Physiologist    Supervising physician immediately available to respond to emergencies  [facility] immediately available ER MD    Physician(s)  Dr. Jacqualine Code and Reita Cliche    Medication changes reported      No    Fall or balance concerns reported     No    Warm-up and Cool-down  Performed as group-led instruction    Resistance Training Performed  Yes    VAD Patient?  No          Social History   Tobacco Use  Smoking Status Former Smoker  . Packs/day: 2.00  . Years: 46.00  . Pack years: 92.00  . Types: Cigarettes  . Last attempt to quit: 11/08/2012  . Years since quitting: 5.1  Smokeless Tobacco Never Used    Goals Met:  Proper associated with RPD/PD & O2 Sat Independence with exercise equipment Using PLB without cueing & demonstrates good technique Exercise tolerated well Strength training completed today  Goals Unmet:  Not Applicable  Comments: Pt able to follow exercise prescription today without complaint.  Will continue to monitor for progression.    Dr. Emily Filbert is Medical Director for [place] and [facility].

## 2017-12-25 ENCOUNTER — Encounter: Payer: Medicare HMO | Attending: Internal Medicine | Admitting: *Deleted

## 2017-12-25 DIAGNOSIS — J439 Emphysema, unspecified: Secondary | ICD-10-CM | POA: Diagnosis not present

## 2017-12-25 DIAGNOSIS — J449 Chronic obstructive pulmonary disease, unspecified: Secondary | ICD-10-CM

## 2017-12-25 NOTE — Progress Notes (Signed)
Daily Session Note  Patient Details  Name: [name] [name] MRN: [id number] Date of Birth: 01-Apr-1946 Referring Provider:     Pulmonary Rehab from 12/05/2017 in [facility] Cardiac and Pulmonary Rehab  Referring Provider  Ramachandran      Encounter Date: 12/25/2017  Check In: Session Check In - 12/25/17 1021      Check-In   Location  [facility]-Cardiac & Pulmonary Rehab    Staff Present  Earlean Shawl, BS, ACSM CEP, Exercise Physiologist;Amanda Oletta Darter, BA, ACSM CEP, Exercise Physiologist;Joseph Flavia Shipper    Supervising physician immediately available to respond to emergencies  [facility] immediately available ER MD    Physician(s)  Drs. [place] and [place]     Medication changes reported      No    Fall or balance concerns reported     No    Warm-up and Cool-down  Performed as group-led Higher education careers adviser Performed  Yes    VAD Patient?  No      Pain Assessment   Currently in Pain?  No/denies    Multiple Pain Sites  No          Social History   Tobacco Use  Smoking Status Former Smoker  . Packs/day: 2.00  . Years: 46.00  . Pack years: 92.00  . Types: Cigarettes  . Last attempt to quit: 11/08/2012  . Years since quitting: 5.1  Smokeless Tobacco Never Used    Goals Met:  Proper associated with RPD/PD & O2 Sat Independence with exercise equipment Exercise tolerated well No report of cardiac concerns or symptoms Strength training completed today  Goals Unmet:  Not Applicable  Comments: Pt able to follow exercise prescription today without complaint.  Will continue to monitor for progression.    Dr. Emily Filbert is Medical Director for [place] and [facility].

## 2017-12-27 DIAGNOSIS — J439 Emphysema, unspecified: Secondary | ICD-10-CM | POA: Diagnosis not present

## 2017-12-27 DIAGNOSIS — J449 Chronic obstructive pulmonary disease, unspecified: Secondary | ICD-10-CM

## 2017-12-27 NOTE — Progress Notes (Signed)
Daily Session Note  Patient Details  Name: [name] [name] MRN: [id number] Date of Birth: 02/02/1946 Referring Provider:     Pulmonary Rehab from 12/05/2017 in [facility] Cardiac and Pulmonary Rehab  Referring Provider  Ramachandran      Encounter Date: 12/27/2017  Check In: Session Check In - 12/27/17 1020      Check-In   Location  [facility]-Cardiac & Pulmonary Rehab    Staff Present  Justin Mend RCP,RRT,BSRT;Amanda Oletta Darter, BA, ACSM CEP, Exercise Physiologist;Jessica Luan Pulling, [place], RCEP, CCRP, Exercise Physiologist    Supervising physician immediately available to respond to emergencies  [facility] immediately available ER MD    Physician(s)  Dr. Mable Paris and Jimmye Norman    Medication changes reported      No    Fall or balance concerns reported     No    Tobacco Cessation  No Change    Warm-up and Cool-down  Performed as group-led instruction    Resistance Training Performed  Yes    VAD Patient?  No      Pain Assessment   Currently in Pain?  No/denies          Social History   Tobacco Use  Smoking Status Former Smoker  . Packs/day: 2.00  . Years: 46.00  . Pack years: 92.00  . Types: Cigarettes  . Last attempt to quit: 11/08/2012  . Years since quitting: 5.1  Smokeless Tobacco Never Used    Goals Met:  Independence with exercise equipment Exercise tolerated well No report of cardiac concerns or symptoms Strength training completed today  Goals Unmet:  Not Applicable  Comments: Pt able to follow exercise prescription today without complaint.  Will continue to monitor for progression.   Dr. Emily Filbert is Medical Director for [place] and [facility].

## 2017-12-28 ENCOUNTER — Other Ambulatory Visit: Payer: Self-pay | Admitting: Family Medicine

## 2017-12-28 ENCOUNTER — Ambulatory Visit (HOSPITAL_COMMUNITY): Payer: Medicare HMO

## 2017-12-28 ENCOUNTER — Ambulatory Visit (HOSPITAL_COMMUNITY)
Admission: RE | Admit: 2017-12-28 | Discharge: 2017-12-28 | Disposition: A | Discharge status: Home / Self Care | Payer: Medicare HMO | Source: Ambulatory Visit | Attending: Interventional Radiology | Admitting: Interventional Radiology

## 2017-12-28 DIAGNOSIS — I6523 Occlusion and stenosis of bilateral carotid arteries: Secondary | ICD-10-CM | POA: Insufficient documentation

## 2017-12-28 DIAGNOSIS — J439 Emphysema, unspecified: Secondary | ICD-10-CM | POA: Diagnosis not present

## 2017-12-28 DIAGNOSIS — R531 Weakness: Secondary | ICD-10-CM | POA: Insufficient documentation

## 2017-12-28 DIAGNOSIS — R42 Dizziness and giddiness: Secondary | ICD-10-CM

## 2017-12-28 DIAGNOSIS — R4781 Slurred speech: Secondary | ICD-10-CM | POA: Diagnosis not present

## 2017-12-28 DIAGNOSIS — I771 Stricture of artery: Secondary | ICD-10-CM | POA: Diagnosis not present

## 2017-12-28 DIAGNOSIS — I639 Cerebral infarction, unspecified: Secondary | ICD-10-CM | POA: Diagnosis not present

## 2017-12-28 DIAGNOSIS — I700 Atherosclerosis of aorta: Secondary | ICD-10-CM | POA: Diagnosis not present

## 2017-12-28 MED ORDER — IOPAMIDOL (ISOVUE-370) INJECTION 76%
100.0000 mL | Freq: Once | INTRAVENOUS | Status: AC | PRN
  Administered 2017-12-28: 100 mL via INTRAVENOUS

## 2017-12-28 MED ORDER — IOPAMIDOL (ISOVUE-370) INJECTION 76%
INTRAVENOUS | Status: AC
  Filled 2017-12-28: qty 100

## 2017-12-28 NOTE — Telephone Encounter (Signed)
Last filled:  10/26/17, #90 Last OV:  11/20/17 Next (CPE):  07/02/18

## 2018-01-01 ENCOUNTER — Other Ambulatory Visit (HOSPITAL_COMMUNITY): Payer: Self-pay | Admitting: Interventional Radiology

## 2018-01-01 DIAGNOSIS — I771 Stricture of artery: Secondary | ICD-10-CM

## 2018-01-01 DIAGNOSIS — R531 Weakness: Secondary | ICD-10-CM

## 2018-01-01 DIAGNOSIS — J439 Emphysema, unspecified: Secondary | ICD-10-CM | POA: Diagnosis not present

## 2018-01-01 DIAGNOSIS — J449 Chronic obstructive pulmonary disease, unspecified: Secondary | ICD-10-CM

## 2018-01-01 DIAGNOSIS — R42 Dizziness and giddiness: Secondary | ICD-10-CM

## 2018-01-01 NOTE — Progress Notes (Signed)
Pulmonary Individual Treatment Plan  Patient Details  Name: [name] [name] MRN: [id number] Date of Birth: 08/22/1946 Referring Provider:     Pulmonary Rehab from 12/05/2017 in [facility] Cardiac and Pulmonary Rehab  Referring Provider  Ramachandran      Initial Encounter Date:    Pulmonary Rehab from 12/05/2017 in [facility] Cardiac and Pulmonary Rehab  Date  12/05/17  Referring Provider  Ashby Dawes      Visit Diagnosis: Chronic obstructive pulmonary disease, unspecified COPD type ([place])  Patient's Home Medications on Admission:  Current Outpatient Medications:  .  acetaminophen (TYLENOL) 500 MG tablet, Take 500 mg by mouth 2 (two) times daily as needed for mild pain. , Disp: , Rfl:  .  albuterol (PROVENTIL HFA;VENTOLIN HFA) 108 (90 Base) MCG/ACT inhaler, Inhale 2 puffs into the lungs every 6 (six) hours as needed for wheezing or shortness of breath., Disp: 1 Inhaler, Rfl: 2 .  alendronate (FOSAMAX) 70 MG tablet, TAKE 1 TABLET WEEKLY  WITH A FULL GLASS OF WATER ON AN EMPTY STOMACH, Disp: 12 tablet, Rfl: 3 .  aspirin EC 81 MG tablet, Take 81 mg by mouth daily., Disp: , Rfl:  .  atorvastatin (LIPITOR) 40 MG tablet, TAKE 1 TABLET (40 MG TOTAL) BY MOUTH DAILY., Disp: 90 tablet, Rfl: 2 .  benazepril (LOTENSIN) 5 MG tablet, Take 1 tablet (5 mg total) by mouth daily., Disp: 30 tablet, Rfl: 3 .  Calcium Carb-Cholecalciferol (CALCIUM-VITAMIN D) 600-400 MG-UNIT TABS, Take 1 tablet by mouth daily., Disp: , Rfl:  .  Cholecalciferol (VITAMIN D PO), Take 1 capsule by mouth daily., Disp: , Rfl:  .  clopidogrel (PLAVIX) 75 MG tablet, TAKE 1 TABLET EVERY DAY, Disp: 90 tablet, Rfl: 1 .  fexofenadine (ALLEGRA) 180 MG tablet, Take 180 mg by mouth daily as needed for allergies. , Disp: , Rfl:  .  fluticasone-salmeterol (ADVAIR HFA) 115-21 MCG/ACT inhaler, Inhale 2 puffs into the lungs 2 (two) times daily. Rinse mouth after use., Disp: 1 Inhaler, Rfl: 12 .  furosemide (LASIX) 20 MG tablet, Take 1 tablet (20 mg  total) by mouth as needed for fluid or edema. May take once or twice a week for swelling, Disp: 40 tablet, Rfl: 0 .  levothyroxine (SYNTHROID, LEVOTHROID) 25 MCG tablet, Take 1 tablet (25 mcg total) by mouth daily before breakfast., Disp: 90 tablet, Rfl: 1 .  Potassium Chloride ER 20 MEQ TBCR, Take 40 mEq by mouth as needed (whenever you take lasix). To be taken with lasix, Disp: 40 tablet, Rfl: 0 .  sertraline (ZOLOFT) 100 MG tablet, Take 1 tablet (100 mg total) by mouth daily., Disp: 90 tablet, Rfl: 3 .  traMADol (ULTRAM) 50 MG tablet, Take 50-100 mg by mouth every 6 (six) hours as needed for moderate pain. , Disp: , Rfl:  .  traZODone (DESYREL) 100 MG tablet, Take 1 tablet (100 mg total) by mouth at bedtime. for sleep, Disp: 90 tablet, Rfl: 1  Current Facility-Administered Medications:  .  0.9 %  sodium chloride infusion, 500 mL, Intravenous, Continuous, Danis, Kirke Corin, MD  Past Medical History: Past Medical History:  Diagnosis Date  . Aneurysm ([place])   . Anxiety   . Anxiety and depression   . Carotid stenosis    R 50% (12/2012)  . Concussion 08/03/2015  . COPD (chronic obstructive pulmonary disease) ([place]) 12/2012   spirometry: Pre: FVC 84%, FEV1 69%, ratio 0.64 consistent with moderate obstruction.  . Depression   . Fall 08/03/2015   d/c home health 08/2015  .  Fracture of cervical vertebra, C5 ([facility]) 08/06/2015  . History of chicken pox   . Hyperlipidemia   . Hypertension   . Lower back pain    h/o HNP s/p surgery  . Osteoarthritis    h/o ruptured disc s/p ESI  . Osteoporosis 11/2010   DEXA -2.7 spine, thoracic compression fracture  . Peripheral vascular disease ([place])   . Smoker    quit 10/2012  . Stroke [facility]) 2010   x3 with residual R hemiparesis, s/p R MCA balloon angioplasty (2010)    Tobacco Use: Social History   Tobacco Use  Smoking Status Former Smoker  . Packs/day: 2.00  . Years: 46.00  . Pack years: 92.00  . Types: Cigarettes  . Last attempt to quit:  11/08/2012  . Years since quitting: 5.1  Smokeless Tobacco Never Used    Labs: Recent Review Flowsheet Data    Labs for ITP Cardiac and Pulmonary Rehab Latest Ref Rng & Units 12/29/2014 01/29/2015 01/25/2016 06/26/2017 09/11/2017   Cholestrol 0 - 200 mg/dL - 177 167 188 -   LDLCALC 0 - 99 mg/dL - 96 91 107(H) -   LDLDIRECT mg/dL - - - - 84.0   HDL >39.00 mg/dL - 58.70 58.40 65.80 -   Trlycerides 0.0 - 149.0 mg/dL - 110.0 86.0 78.0 -   Hemoglobin A1c 4.6 - 6.1 % - - - - -   TCO2 0 - 100 mmol/L 22 - - - -       Pulmonary Assessment Scores: Pulmonary Assessment Scores    Row Name 12/05/17 1112         ADL UCSD   ADL Phase  Entry     SOB Score total  99     Rest  0     Walk  3     Stairs  5     [place]  5     Dress  5     Shop  5       CAT Score   CAT Score  25       mMRC Score   mMRC Score  2        Pulmonary Function Assessment: Pulmonary Function Assessment - 12/05/17 1116      Initial Spirometry Results   FVC%  64 %    FEV1%  63 %    FEV1/FVC Ratio  73.77    Comments  good patient effort      Post Bronchodilator Spirometry Results   FVC%  62.08 %    FEV1%  66.15 %    FEV1/FVC Ratio  80.48    Comments  good patient effort      Breath   Bilateral Breath Sounds  Clear    Shortness of Breath  Limiting activity;Yes       Exercise Target Goals:    Exercise Program Goal: Individual exercise prescription set using results from initial 6 min walk test and THRR while considering  patient's activity barriers and safety.    Exercise Prescription Goal: Initial exercise prescription builds to 30-45 minutes a day of aerobic activity, 2-3 days per week.  Home exercise guidelines will be given to patient during program as part of exercise prescription that the participant will acknowledge.  Activity Barriers & Risk Stratification:   6 Minute Walk: 6 Minute Walk    Row Name 12/05/17 1219         6 Minute Walk   Phase  Initial     Distance  765 feet  Walk  Time  6 minutes     # of Rest Breaks  0     MPH  1.45     METS  1.97     RPE  13     Perceived Dyspnea   3     Symptoms  Yes (comment)     Comments  pain outside of R leg - pt states due to stroke     Resting HR  95 bpm     Resting BP  120/68     Resting Oxygen Saturation   93 %     Exercise Oxygen Saturation  during 6 min walk  98 %     Max Ex. HR  126 bpm     Max Ex. BP  132/84     2 Minute Post BP  106/70       Oxygen Initial Assessment: Oxygen Initial Assessment - 12/05/17 1114      Home Oxygen   Home Oxygen Device  Home Concentrator;E-Tanks    Sleep Oxygen Prescription  Continuous    Liters per minute  1    Home Exercise Oxygen Prescription  None    Home at Rest Exercise Oxygen Prescription  None    Compliance with Home Oxygen Use  Yes      Initial 6 min Walk   Oxygen Used  None      Program Oxygen Prescription   Program Oxygen Prescription  None      Intervention   Short Term Goals  To learn and exhibit compliance with exercise, home and travel O2 prescription;To learn and understand importance of maintaining oxygen saturations>88%;To learn and demonstrate proper use of respiratory medications;To learn and demonstrate proper pursed lip breathing techniques or other breathing techniques.;To learn and understand importance of monitoring SPO2 with pulse oximeter and demonstrate accurate use of the pulse oximeter.    Long  Term Goals  Exhibits compliance with exercise, home and travel O2 prescription;Verbalizes importance of monitoring SPO2 with pulse oximeter and return demonstration;Maintenance of O2 saturations>88%;Exhibits proper breathing techniques, such as pursed lip breathing or other method taught during program session;Compliance with respiratory medication;Demonstrates proper use of MDI's       Oxygen Re-Evaluation: Oxygen Re-Evaluation    Row Name 12/13/17 0947             Program Oxygen Prescription   Program Oxygen Prescription  None         Home  Oxygen   Home Oxygen Device  Home Concentrator;E-Tanks       Sleep Oxygen Prescription  Continuous       Liters per minute  1       Home Exercise Oxygen Prescription  None       Home at Rest Exercise Oxygen Prescription  None       Compliance with Home Oxygen Use  Yes         Goals/Expected Outcomes   Short Term Goals  To learn and exhibit compliance with exercise, home and travel O2 prescription;To learn and understand importance of maintaining oxygen saturations>88%;To learn and demonstrate proper use of respiratory medications;To learn and demonstrate proper pursed lip breathing techniques or other breathing techniques.;To learn and understand importance of monitoring SPO2 with pulse oximeter and demonstrate accurate use of the pulse oximeter.       Long  Term Goals  Exhibits compliance with exercise, home and travel O2 prescription;Verbalizes importance of monitoring SPO2 with pulse oximeter and return demonstration;Maintenance of O2 saturations>88%;Exhibits proper breathing techniques,  such as pursed lip breathing or other method taught during program session;Compliance with respiratory medication;Demonstrates proper use of MDI's       Comments  Reviewed PLB technique with pt.  Talked about how it work and it's important to maintaining his exercise saturations.         Goals/Expected Outcomes  Short: Become more profiecient at using PLB.   Long: Become independent at using PLB.          Oxygen Discharge (Final Oxygen Re-Evaluation): Oxygen Re-Evaluation - 12/13/17 0947      Program Oxygen Prescription   Program Oxygen Prescription  None      Home Oxygen   Home Oxygen Device  Home Concentrator;E-Tanks    Sleep Oxygen Prescription  Continuous    Liters per minute  1    Home Exercise Oxygen Prescription  None    Home at Rest Exercise Oxygen Prescription  None    Compliance with Home Oxygen Use  Yes      Goals/Expected Outcomes   Short Term Goals  To learn and exhibit compliance  with exercise, home and travel O2 prescription;To learn and understand importance of maintaining oxygen saturations>88%;To learn and demonstrate proper use of respiratory medications;To learn and demonstrate proper pursed lip breathing techniques or other breathing techniques.;To learn and understand importance of monitoring SPO2 with pulse oximeter and demonstrate accurate use of the pulse oximeter.    Long  Term Goals  Exhibits compliance with exercise, home and travel O2 prescription;Verbalizes importance of monitoring SPO2 with pulse oximeter and return demonstration;Maintenance of O2 saturations>88%;Exhibits proper breathing techniques, such as pursed lip breathing or other method taught during program session;Compliance with respiratory medication;Demonstrates proper use of MDI's    Comments  Reviewed PLB technique with pt.  Talked about how it work and it's important to maintaining his exercise saturations.      Goals/Expected Outcomes  Short: Become more profiecient at using PLB.   Long: Become independent at using PLB.       Initial Exercise Prescription: Initial Exercise Prescription - 12/05/17 1200      Date of Initial Exercise RX and Referring Provider   Date  12/05/17    Referring Provider  Ashby Dawes      Treadmill   MPH  1    Grade  0    Minutes  15    METs  1.77      NuStep   Level  1    SPM  80    Minutes  15    METs  1.7      Biostep-RELP   Level  1    SPM  50    Minutes  15    METs  1.7      Track   Laps  17    Minutes  15    METs  1.7      Prescription Details   Frequency (times per week)  3    Duration  Progress to 45 minutes of aerobic exercise without signs/symptoms of physical distress      Intensity   THRR 40-80% of Max Heartrate  116-138    Ratings of Perceived Exertion  11-15    Perceived Dyspnea  0-4      Resistance Training   Training Prescription  Yes    Weight  2 lb    Reps  10-15       Perform Capillary Blood Glucose checks as  needed.  Exercise Prescription Changes: Exercise Prescription Changes  [place] Name 12/20/17 1100             Response to Exercise   Blood Pressure (Admit)  124/74       Blood Pressure (Exercise)  106/64       Blood Pressure (Exit)  126/76       Heart Rate (Admit)  82 bpm       Heart Rate (Exercise)  122 bpm       Heart Rate (Exit)  96 bpm       Oxygen Saturation (Admit)  96 %       Oxygen Saturation (Exercise)  95 %       Oxygen Saturation (Exit)  98 %       Rating of Perceived Exertion (Exercise)  11       Perceived Dyspnea (Exercise)  0       Symptoms  none       Duration  Continue with 45 min of aerobic exercise without signs/symptoms of physical distress.       Intensity  THRR unchanged         Progression   Progression  Continue to progress workloads to maintain intensity without signs/symptoms of physical distress.         Treadmill   MPH  0.8       Grade  0.5       Minutes  15         Biostep-RELP   Level  2       SPM  36       Minutes  15       METs  1          Exercise Comments: Exercise Comments    Row Name 12/13/17 0946           Exercise Comments  First full day of exercise!  Patient was oriented to gym and equipment including functions, settings, policies, and procedures.  Patient's individual exercise prescription and treatment plan were reviewed.  All starting workloads were established based on the results of the 6 minute walk test done at initial orientation visit.  The plan for exercise progression was also introduced and progression will be customized based on patient's performance and goals.          Exercise Goals and Review: Exercise Goals    Row Name 12/13/17 1018             Exercise Goals   Increase Physical Activity  Yes       Intervention  Provide advice, education, support and counseling about physical activity/exercise needs.;Develop an individualized exercise prescription for aerobic and resistive training based on initial  evaluation findings, risk stratification, comorbidities and participant's personal goals.       Expected Outcomes  Short Term: Attend rehab on a regular basis to increase amount of physical activity.;Long Term: Add in home exercise to make exercise part of routine and to increase amount of physical activity.;Long Term: Exercising regularly at least 3-5 days a week.       Increase Strength and Stamina  Yes       Intervention  Provide advice, education, support and counseling about physical activity/exercise needs.;Develop an individualized exercise prescription for aerobic and resistive training based on initial evaluation findings, risk stratification, comorbidities and participant's personal goals.       Expected Outcomes  Short Term: Increase workloads from initial exercise prescription for resistance, speed, and METs.;Short Term: Perform resistance training exercises routinely during rehab and add  in resistance training at home;Long Term: Improve cardiorespiratory fitness, muscular endurance and strength as measured by increased METs and functional capacity (6MWT)       Able to understand and use rate of perceived exertion (RPE) scale  Yes       Intervention  Provide education and explanation on how to use RPE scale       Expected Outcomes  Short Term: Able to use RPE daily in rehab to express subjective intensity level;Long Term:  Able to use RPE to guide intensity level when exercising independently       Able to understand and use Dyspnea scale  Yes       Intervention  Provide education and explanation on how to use Dyspnea scale       Expected Outcomes  Short Term: Able to use Dyspnea scale daily in rehab to express subjective sense of shortness of breath during exertion;Long Term: Able to use Dyspnea scale to guide intensity level when exercising independently       Knowledge and understanding of Target Heart Rate Range (THRR)  Yes       Intervention  Provide education and explanation of THRR  including how the numbers were predicted and where they are located for reference       Expected Outcomes  Short Term: Able to state/look up THRR;Long Term: Able to use THRR to govern intensity when exercising independently;Short Term: Able to use daily as guideline for intensity in rehab       Able to check pulse independently  Yes       Intervention  Provide education and demonstration on how to check pulse in carotid and radial arteries.;Review the importance of being able to check your own pulse for safety during independent exercise       Expected Outcomes  Short Term: Able to explain why pulse checking is important during independent exercise;Long Term: Able to check pulse independently and accurately       Understanding of Exercise Prescription  Yes       Intervention  Provide education, explanation, and written materials on patient's individual exercise prescription       Expected Outcomes  Short Term: Able to explain program exercise prescription;Long Term: Able to explain home exercise prescription to exercise independently          Exercise Goals Re-Evaluation : Exercise Goals Re-Evaluation    Row Name 12/13/17 0947 12/20/17 1202           Exercise Goal Re-Evaluation   Exercise Goals Review  Understanding of Exercise Prescription;Able to understand and use Dyspnea scale;Knowledge and understanding of Target Heart Rate Range (THRR);Able to understand and use rate of perceived exertion (RPE) scale  Increase Physical Activity;Able to understand and use rate of perceived exertion (RPE) scale;Increase Strength and Stamina;Able to understand and use Dyspnea scale      Comments  Reviewed RPE scale, THR and program prescription with pt today.  Pt voiced understanding and was given a copy of goals to take home.   Pt is tolerating exercise well.  Staff will continue to monitor progress.        Expected Outcomes  Short: Use RPE daily to regulate intensity.  Long: Follow program prescription in  THR.  Short - Pt will attend 3 days per week Long - Pt will complete LW and improve overall MET level         Discharge Exercise Prescription (Final Exercise Prescription Changes): Exercise Prescription Changes - 12/20/17 1100  Response to Exercise   Blood Pressure (Admit)  124/74    Blood Pressure (Exercise)  106/64    Blood Pressure (Exit)  126/76    Heart Rate (Admit)  82 bpm    Heart Rate (Exercise)  122 bpm    Heart Rate (Exit)  96 bpm    Oxygen Saturation (Admit)  96 %    Oxygen Saturation (Exercise)  95 %    Oxygen Saturation (Exit)  98 %    Rating of Perceived Exertion (Exercise)  11    Perceived Dyspnea (Exercise)  0    Symptoms  none    Duration  Continue with 45 min of aerobic exercise without signs/symptoms of physical distress.    Intensity  THRR unchanged      Progression   Progression  Continue to progress workloads to maintain intensity without signs/symptoms of physical distress.      Treadmill   MPH  0.8    Grade  0.5    Minutes  15      Biostep-RELP   Level  2    SPM  36    Minutes  15    METs  1       Nutrition:  Target Goals: Understanding of nutrition guidelines, daily intake of sodium '1500mg'$ , cholesterol '200mg'$ , calories 30% from fat and 7% or less from saturated fats, daily to have 5 or more servings of fruits and vegetables.  Biometrics: Pre Biometrics - 12/05/17 1217      Pre Biometrics   Height  '5\' 1"'$  (1.549 m)    Weight  153 lb 12.8 oz (69.8 kg)    Waist Circumference  35.5 inches    Hip Circumference  46.5 inches    Waist to Hip Ratio  0.76 %    BMI (Calculated)  29.08        Nutrition Therapy Plan and Nutrition Goals: Nutrition Therapy & Goals - 12/05/17 1112      Personal Nutrition Goals   Comments  Lose weight, Learn healthier eating habits.      Intervention Plan   Intervention  Prescribe, educate and counsel regarding individualized specific dietary modifications aiming towards targeted core components such as  weight, hypertension, lipid management, diabetes, heart failure and other comorbidities.;Nutrition handout(s) given to patient.    Expected Outcomes  Short Term Goal: Understand basic principles of dietary content, such as calories, fat, sodium, cholesterol and nutrients.;Long Term Goal: Adherence to prescribed nutrition plan.       Nutrition Assessments:   Nutrition Goals Re-Evaluation:   Nutrition Goals Discharge (Final Nutrition Goals Re-Evaluation):   Psychosocial: Target Goals: Acknowledge presence or absence of significant depression and/or stress, maximize coping skills, provide positive support system. Participant is able to verbalize types and ability to use techniques and skills needed for reducing stress and depression.   Initial Review & Psychosocial Screening: Initial Psych Review & Screening - 12/05/17 1110      Initial Review   Current issues with  Current Depression;History of Depression;Current Psychotropic Meds;Current Sleep Concerns;Current Stress Concerns    Source of Stress Concerns  Chronic Illness;Unable to perform yard/household activities;Unable to participate in former interests or hobbies    Comments  She takes Zoloft to help her depression.      Family Dynamics   Good Support System?  Yes    Comments  Looks to her daughters and grandaughters for support      Barriers   Psychosocial barriers to participate in program  The patient should benefit  from training in stress management and relaxation.      Screening Interventions   Interventions  Encouraged to exercise;Provide feedback about the scores to participant;Program counselor consult;To provide support and resources with identified psychosocial needs    Expected Outcomes  Short Term goal: Utilizing psychosocial counselor, staff and physician to assist with identification of specific Stressors or current issues interfering with healing process. Setting desired goal for each stressor or current issue  identified.;Long Term Goal: Stressors or current issues are controlled or eliminated.;Short Term goal: Identification and review with participant of any Quality of Life or Depression concerns found by scoring the questionnaire.;Long Term goal: The participant improves quality of Life and PHQ9 Scores as seen by post scores and/or verbalization of changes       Quality of Life Scores:  Scores of 19 and below usually indicate a poorer quality of life in these areas.  A difference of  2-3 points is a clinically meaningful difference.  A difference of 2-3 points in the total score of the Quality of Life Index has been associated with significant improvement in overall quality of life, self-image, physical symptoms, and general health in studies assessing change in quality of life.  PHQ-9: Recent Review Flowsheet Data    Depression screen [facility] 2/9 12/13/2017 12/05/2017 06/26/2017 02/01/2016 01/29/2015   Decreased Interest 1 1 0 0 0   Down, Depressed, Hopeless 0 2 1 0 3   PHQ - 2 Score '1 3 1 '$ 0 3   Altered sleeping 0 2 1 - 3    Tired, decreased energy 2 3 0 - 3   Change in appetite 2 2 0 - 0    Feeling bad or failure about yourself  1 2 0 - 3    Trouble concentrating 1 1 0 - 1   Moving slowly or fidgety/restless 2 2 0 - 3    Suicidal thoughts 0 0 0 - 0   PHQ-9 Score '9 15 2 '$ - 16   Difficult doing work/chores Somewhat difficult Somewhat difficult Not difficult at all - Somewhat difficult     Interpretation of Total Score  Total Score Depression Severity:  1-4 = Minimal depression, 5-9 = Mild depression, 10-14 = Moderate depression, 15-19 = Moderately severe depression, 20-27 = Severe depression   Psychosocial Evaluation and Intervention: Psychosocial Evaluation - 12/13/17 1115      Psychosocial Evaluation & Interventions   Interventions  Encouraged to exercise with the program and follow exercise prescription    Comments  Counselor met today with Ms. Beaird [facility]) for initial psychosocial  evaluation.  She is a 72 year old who was recently diagnosed with COPD.  Simora has a strong support system with two daughters and a niece who live close by and she is involved in her local church.  Tanazia describes herself as being in "good health" overall other than a recent back surgery and now COPD. She sleeps "better" lately and has a "fair" appetite.  She reports a history of depression over the past several years and has been on medications to help with her mood - which she states is generally positive lately.   Counselor mentioned her PHQ-9 scores of "15" indicating moderately severe symptoms of depression; and reviewed some of the questions with her reporting sleeping better and no longer depressed or hopeless.  She reported feeling better about herself and has more energy lately.  As a result her scores dropped to a "9" indicating some mild depressive symptoms are present.  Her  main stress is primarily her health currently.   Kafi would like to breathe and walk better while in this program.  She will be followed by staff throughout the course of this program.      Expected Outcomes  Marivel will benefit from consistent exercise to achieve her stated goals.  She will continue to take her medications and exercise for her mental health as well.  The educational and psychoeducational components of this program will be helpful in [place] understanding and coping more positively with her condition.      Continue Psychosocial Services   Follow up required by staff       Psychosocial Re-Evaluation:   Psychosocial Discharge (Final Psychosocial Re-Evaluation):   Education: Education Goals: Education classes will be provided on a weekly basis, covering required topics. Participant will state understanding/return demonstration of topics presented.  Learning Barriers/Preferences: Learning Barriers/Preferences - 12/05/17 1114      Learning Barriers/Preferences   Learning Barriers  Sight wears reading  glasses    Learning Preferences  None       Education Topics:  Initial Evaluation Education: - Verbal, written and demonstration of respiratory meds, oximetry and breathing techniques. Instruction on use of nebulizers and MDIs and importance of monitoring MDI activations.   Pulmonary Rehab from 12/25/2017 in [facility] Cardiac and Pulmonary Rehab  Date  12/05/17  Educator  [facility]  Instruction Review Code  1- Verbalizes Understanding      General Nutrition Guidelines/Fats and Fiber: -Group instruction provided by verbal, written material, models and posters to present the general guidelines for heart healthy nutrition. Gives an explanation and review of dietary fats and fiber.   Controlling Sodium/Reading Food Labels: -Group verbal and written material supporting the discussion of sodium use in heart healthy nutrition. Review and explanation with models, verbal and written materials for utilization of the food label.   Pulmonary Rehab from 12/25/2017 in [facility] Cardiac and Pulmonary Rehab  Date  12/25/17  Educator  [facility]  Instruction Review Code  1- Verbalizes Understanding      Exercise Physiology & General Exercise Guidelines: - Group verbal and written instruction with models to review the exercise physiology of the cardiovascular system and associated critical values. Provides general exercise guidelines with specific guidelines to those with heart or lung disease.    Aerobic Exercise & Resistance Training: - Gives group verbal and written instruction on the various components of exercise. Focuses on aerobic and resistive training programs and the benefits of this training and how to safely progress through these programs.   Flexibility, Balance, Mind/Body Relaxation: Provides group verbal/written instruction on the benefits of flexibility and balance training, including mind/body exercise modes such as yoga, pilates and tai chi.  Demonstration and skill practice provided.   Stress and  Anxiety: - Provides group verbal and written instruction about the health risks of elevated stress and causes of high stress.  Discuss the correlation between heart/lung disease and anxiety and treatment options. Review healthy ways to manage with stress and anxiety.   Depression: - Provides group verbal and written instruction on the correlation between heart/lung disease and depressed mood, treatment options, and the stigmas associated with seeking treatment.   Exercise & Equipment Safety: - Individual verbal instruction and demonstration of equipment use and safety with use of the equipment.   Pulmonary Rehab from 12/25/2017 in [facility] Cardiac and Pulmonary Rehab  Date  12/05/17  Educator  [facility]  Instruction Review Code  1- Verbalizes Understanding      Infection Prevention: -  Provides verbal and written material to individual with discussion of infection control including proper hand washing and proper equipment cleaning during exercise session.   Pulmonary Rehab from 12/25/2017 in [facility] Cardiac and Pulmonary Rehab  Date  12/05/17  Educator  [facility]  Instruction Review Code  1- Verbalizes Understanding      Falls Prevention: - Provides verbal and written material to individual with discussion of falls prevention and safety.   Pulmonary Rehab from 12/25/2017 in [facility] Cardiac and Pulmonary Rehab  Date  12/05/17  Educator  [facility]  Instruction Review Code  1- Verbalizes Understanding      Diabetes: - Individual verbal and written instruction to review signs/symptoms of diabetes, desired ranges of glucose level fasting, after meals and with exercise. Advice that pre and post exercise glucose checks will be done for 3 sessions at entry of program.   Chronic Lung Diseases: - Group verbal and written instruction to review updates, respiratory medications, advancements in procedures and treatments. Discuss use of supplemental oxygen including available portable oxygen systems, continuous and intermittent  flow rates, concentrators, personal use and safety guidelines. Review proper use of inhaler and spacers. Provide informative websites for self-education.    Pulmonary Rehab from 12/25/2017 in [facility] Cardiac and Pulmonary Rehab  Date  12/20/17  Educator  [facility]  Instruction Review Code  1- Verbalizes Understanding      Energy Conservation: - Provide group verbal and written instruction for methods to conserve energy, plan and organize activities. Instruct on pacing techniques, use of adaptive equipment and posture/positioning to relieve shortness of breath.   Triggers and Exacerbations: - Group verbal and written instruction to review types of environmental triggers and ways to prevent exacerbations. Discuss weather changes, air quality and the benefits of nasal washing. Review warning signs and symptoms to help prevent infections. Discuss techniques for effective airway clearance, coughing, and vibrations.   AED/CPR: - Group verbal and written instruction with the use of models to demonstrate the basic use of the AED with the basic ABC's of resuscitation.   Anatomy and Physiology of the Lungs: - Group verbal and written instruction with the use of models to provide basic lung anatomy and physiology related to function, structure and complications of lung disease.   Anatomy & Physiology of the Heart: - Group verbal and written instruction and models provide basic cardiac anatomy and physiology, with the coronary electrical and arterial systems. Review of Valvular disease and Heart Failure   Cardiac Medications: - Group verbal and written instruction to review commonly prescribed medications for heart disease. Reviews the medication, class of the drug, and side effects.   Know Your Numbers and Risk Factors: -Group verbal and written instruction about important numbers in your health.  Discussion of what are risk factors and how they play a role in the disease process.  Review of Cholesterol,  Blood Pressure, Diabetes, and BMI and the role they play in your overall health.   Sleep Hygiene: -Provides group verbal and written instruction about how sleep can affect your health.  Define sleep hygiene, discuss sleep cycles and impact of sleep habits. Review good sleep hygiene tips.    Other: -Provides group and verbal instruction on various topics (see comments)    Knowledge Questionnaire Score: Knowledge Questionnaire Score - 12/05/17 1114      Knowledge Questionnaire Score   Pre Score  15/18 Reviewed with patient        Core Components/Risk Factors/Patient Goals at Admission: Personal Goals and Risk Factors at Admission -  12/05/17 1115      Core Components/Risk Factors/Patient Goals on Admission    Weight Management  Yes;Weight Maintenance;Weight Loss    Intervention  Weight Management: Develop a combined nutrition and exercise program designed to reach desired caloric intake, while maintaining appropriate intake of nutrient and fiber, sodium and fats, and appropriate energy expenditure required for the weight goal.;Weight Management: Provide education and appropriate resources to help participant work on and attain dietary goals.;Weight Management/Obesity: Establish reasonable short term and long term weight goals.    Admit Weight  153 lb 12.8 oz (69.8 kg)    Goal Weight: Short Term  148 lb (67.1 kg)    Goal Weight: Long Term  130 lb (59 kg)    Expected Outcomes  Short Term: Continue to assess and modify interventions until short term weight is achieved;Long Term: Adherence to nutrition and physical activity/exercise program aimed toward attainment of established weight goal;Weight Maintenance: Understanding of the daily nutrition guidelines, which includes 25-35% calories from fat, 7% or less cal from saturated fats, less than '200mg'$  cholesterol, less than 1.5gm of sodium, & 5 or more servings of fruits and vegetables daily;Weight Loss: Understanding of general recommendations  for a balanced deficit meal plan, which promotes 1-2 lb weight loss per week and includes a negative energy balance of [phone] kcal/d;Understanding recommendations for meals to include 15-35% energy as protein, 25-35% energy from fat, 35-60% energy from carbohydrates, less than '200mg'$  of dietary cholesterol, 20-35 gm of total fiber daily;Understanding of distribution of calorie intake throughout the day with the consumption of 4-5 meals/snacks    Improve shortness of breath with ADL's  Yes    Intervention  Provide education, individualized exercise plan and daily activity instruction to help decrease symptoms of SOB with activities of daily living.    Expected Outcomes  Short Term: Improve cardiorespiratory fitness to achieve a reduction of symptoms when performing ADLs;Long Term: Be able to perform more ADLs without symptoms or delay the onset of symptoms    Heart Failure  Yes    Intervention  Provide a combined exercise and nutrition program that is supplemented with education, support and counseling about heart failure. Directed toward relieving symptoms such as shortness of breath, decreased exercise tolerance, and extremity edema.    Expected Outcomes  Improve functional capacity of life;Short term: Attendance in program 2-3 days a week with increased exercise capacity. Reported lower sodium intake. Reported increased fruit and vegetable intake. Reports medication compliance.;Short term: Daily weights obtained and reported for increase. Utilizing diuretic protocols set by physician.;Long term: Adoption of self-care skills and reduction of barriers for early signs and symptoms recognition and intervention leading to self-care maintenance.    Hypertension  Yes    Intervention  Provide education on lifestyle modifcations including regular physical activity/exercise, weight management, moderate sodium restriction and increased consumption of fresh fruit, vegetables, and low fat dairy, alcohol moderation,  and smoking cessation.;Monitor prescription use compliance.    Expected Outcomes  Short Term: Continued assessment and intervention until BP is < 140/70m HG in hypertensive participants. < 130/825mHG in hypertensive participants with diabetes, heart failure or chronic kidney disease.;Long Term: Maintenance of blood pressure at goal levels.       Core Components/Risk Factors/Patient Goals Review:    Core Components/Risk Factors/Patient Goals at Discharge (Final Review):    ITP Comments: ITP Comments    Row Name 12/05/17 1045 01/01/18 0828         ITP Comments  Medical Evaluation completed. Chart sent for review  and changes to Dr. Emily Filbert Director of [place]. Diagnosis can be found in [facility] encounter 11/21/17   30 day review completed. ITP sent to Dr. Emily Filbert Director of [place]. Continue with ITP unless changes are made by physician         Comments: 30 day review

## 2018-01-01 NOTE — Progress Notes (Signed)
Daily Session Note  Patient Details  Name: [name] [name] MRN: [id number] Date of Birth: 1946-02-02 Referring Provider:     Pulmonary Rehab from 12/05/2017 in [facility] Cardiac and Pulmonary Rehab  Referring Provider  Ramachandran      Encounter Date: 01/01/2018  Check In: Session Check In - 01/01/18 0951      Check-In   Location  [facility]-Cardiac & Pulmonary Rehab    Staff Present  Nada Maclachlan, BA, ACSM CEP, Exercise Physiologist;Kelly Amedeo Plenty, BS, ACSM CEP, Exercise Physiologist;Vickye Astorino Flavia Shipper    Supervising physician immediately available to respond to emergencies  [facility] immediately available ER MD    Physician(s)  Dr. Corky Downs and [facility]    Medication changes reported      No    Fall or balance concerns reported     No    Tobacco Cessation  No Change    Warm-up and Cool-down  Performed as group-led instruction    Resistance Training Performed  Yes    VAD Patient?  No      Pain Assessment   Currently in Pain?  No/denies          Social History   Tobacco Use  Smoking Status Former Smoker  . Packs/day: 2.00  . Years: 46.00  . Pack years: 92.00  . Types: Cigarettes  . Last attempt to quit: 11/08/2012  . Years since quitting: 5.1  Smokeless Tobacco Never Used    Goals Met:  Independence with exercise equipment Exercise tolerated well No report of cardiac concerns or symptoms Strength training completed today  Goals Unmet:  Not Applicable  Comments: Pt able to follow exercise prescription today without complaint.  Will continue to monitor for progression.   Dr. Emily Filbert is Medical Director for [place] and [facility].

## 2018-01-09 ENCOUNTER — Ambulatory Visit (HOSPITAL_COMMUNITY)
Admission: RE | Admit: 2018-01-09 | Discharge: 2018-01-09 | Disposition: A | Discharge status: Home / Self Care | Payer: Medicare HMO | Source: Ambulatory Visit | Attending: Interventional Radiology | Admitting: Interventional Radiology

## 2018-01-09 DIAGNOSIS — I771 Stricture of artery: Secondary | ICD-10-CM

## 2018-01-09 DIAGNOSIS — R202 Paresthesia of skin: Secondary | ICD-10-CM | POA: Diagnosis not present

## 2018-01-09 DIAGNOSIS — R531 Weakness: Secondary | ICD-10-CM

## 2018-01-09 DIAGNOSIS — R42 Dizziness and giddiness: Secondary | ICD-10-CM

## 2018-01-09 HISTORY — PX: IR RADIOLOGIST EVAL & MGMT: IMG5224

## 2018-01-11 ENCOUNTER — Encounter (HOSPITAL_COMMUNITY): Payer: Self-pay | Admitting: Interventional Radiology

## 2018-01-15 ENCOUNTER — Telehealth: Payer: Self-pay

## 2018-01-15 DIAGNOSIS — J449 Chronic obstructive pulmonary disease, unspecified: Secondary | ICD-10-CM | POA: Diagnosis not present

## 2018-01-15 DIAGNOSIS — R269 Unspecified abnormalities of gait and mobility: Secondary | ICD-10-CM | POA: Diagnosis not present

## 2018-01-15 NOTE — Telephone Encounter (Signed)
Called patient to see if she would be returning to [facility]. No answer/busy tone. Unable to leave message. Patient has not attended since 12/25/17.

## 2018-01-17 ENCOUNTER — Telehealth: Payer: Self-pay

## 2018-01-17 NOTE — Telephone Encounter (Signed)
[name] NuttingHattie has not been able to attend due to transportation issues - her daughter has been sick and not able to bring her to class.  She will return when she has a ride.

## 2018-01-24 ENCOUNTER — Encounter: Payer: Medicare HMO | Attending: Internal Medicine

## 2018-01-24 DIAGNOSIS — J439 Emphysema, unspecified: Secondary | ICD-10-CM | POA: Insufficient documentation

## 2018-01-29 ENCOUNTER — Ambulatory Visit: Payer: Self-pay | Admitting: *Deleted

## 2018-01-29 ENCOUNTER — Telehealth: Payer: Self-pay | Admitting: Family Medicine

## 2018-01-29 DIAGNOSIS — J449 Chronic obstructive pulmonary disease, unspecified: Secondary | ICD-10-CM

## 2018-01-29 NOTE — Progress Notes (Signed)
Pulmonary Individual Treatment Plan  Patient Details  Name: [name] [name] MRN: [id number] Date of Birth: 08/22/1946 Referring Provider:     Pulmonary Rehab from 12/05/2017 in [facility] Cardiac and Pulmonary Rehab  Referring Provider  Ramachandran      Initial Encounter Date:    Pulmonary Rehab from 12/05/2017 in [facility] Cardiac and Pulmonary Rehab  Date  12/05/17  Referring Provider  Ashby Dawes      Visit Diagnosis: Chronic obstructive pulmonary disease, unspecified COPD type ([place])  Patient's Home Medications on Admission:  Current Outpatient Medications:  .  acetaminophen (TYLENOL) 500 MG tablet, Take 500 mg by mouth 2 (two) times daily as needed for mild pain. , Disp: , Rfl:  .  albuterol (PROVENTIL HFA;VENTOLIN HFA) 108 (90 Base) MCG/ACT inhaler, Inhale 2 puffs into the lungs every 6 (six) hours as needed for wheezing or shortness of breath., Disp: 1 Inhaler, Rfl: 2 .  alendronate (FOSAMAX) 70 MG tablet, TAKE 1 TABLET WEEKLY  WITH A FULL GLASS OF WATER ON AN EMPTY STOMACH, Disp: 12 tablet, Rfl: 3 .  aspirin EC 81 MG tablet, Take 81 mg by mouth daily., Disp: , Rfl:  .  atorvastatin (LIPITOR) 40 MG tablet, TAKE 1 TABLET (40 MG TOTAL) BY MOUTH DAILY., Disp: 90 tablet, Rfl: 2 .  benazepril (LOTENSIN) 5 MG tablet, Take 1 tablet (5 mg total) by mouth daily., Disp: 30 tablet, Rfl: 3 .  Calcium Carb-Cholecalciferol (CALCIUM-VITAMIN D) 600-400 MG-UNIT TABS, Take 1 tablet by mouth daily., Disp: , Rfl:  .  Cholecalciferol (VITAMIN D PO), Take 1 capsule by mouth daily., Disp: , Rfl:  .  clopidogrel (PLAVIX) 75 MG tablet, TAKE 1 TABLET EVERY DAY, Disp: 90 tablet, Rfl: 1 .  fexofenadine (ALLEGRA) 180 MG tablet, Take 180 mg by mouth daily as needed for allergies. , Disp: , Rfl:  .  fluticasone-salmeterol (ADVAIR HFA) 115-21 MCG/ACT inhaler, Inhale 2 puffs into the lungs 2 (two) times daily. Rinse mouth after use., Disp: 1 Inhaler, Rfl: 12 .  furosemide (LASIX) 20 MG tablet, Take 1 tablet (20 mg  total) by mouth as needed for fluid or edema. May take once or twice a week for swelling, Disp: 40 tablet, Rfl: 0 .  levothyroxine (SYNTHROID, LEVOTHROID) 25 MCG tablet, Take 1 tablet (25 mcg total) by mouth daily before breakfast., Disp: 90 tablet, Rfl: 1 .  Potassium Chloride ER 20 MEQ TBCR, Take 40 mEq by mouth as needed (whenever you take lasix). To be taken with lasix, Disp: 40 tablet, Rfl: 0 .  sertraline (ZOLOFT) 100 MG tablet, Take 1 tablet (100 mg total) by mouth daily., Disp: 90 tablet, Rfl: 3 .  traMADol (ULTRAM) 50 MG tablet, Take 50-100 mg by mouth every 6 (six) hours as needed for moderate pain. , Disp: , Rfl:  .  traZODone (DESYREL) 100 MG tablet, Take 1 tablet (100 mg total) by mouth at bedtime. for sleep, Disp: 90 tablet, Rfl: 1  Current Facility-Administered Medications:  .  0.9 %  sodium chloride infusion, 500 mL, Intravenous, Continuous, Danis, Kirke Corin, MD  Past Medical History: Past Medical History:  Diagnosis Date  . Aneurysm ([place])   . Anxiety   . Anxiety and depression   . Carotid stenosis    R 50% (12/2012)  . Concussion 08/03/2015  . COPD (chronic obstructive pulmonary disease) ([place]) 12/2012   spirometry: Pre: FVC 84%, FEV1 69%, ratio 0.64 consistent with moderate obstruction.  . Depression   . Fall 08/03/2015   d/c home health 08/2015  .  Fracture of cervical vertebra, C5 (H[facility]) 08/06/2015  . History of chicken pox   . Hyperlipidemia   . Hypertension   . Lower back pain    h/o HNP s/p surgery  . Osteoarthritis    h/o ruptured disc s/p ESI  . Osteoporosis 11/2010   DEXA -2.7 spine, thoracic compression fracture  . Peripheral vascular disease ([place])   . Smoker    quit 10/2012  . Stroke [facility]) 2010   x3 with residual R hemiparesis, s/p R MCA balloon angioplasty (2010)    Tobacco Use: Social History   Tobacco Use  Smoking Status Former Smoker  . Packs/day: 2.00  . Years: 46.00  . Pack years: 92.00  . Types: Cigarettes  . Last attempt to quit:  11/08/2012  . Years since quitting: 5.2  Smokeless Tobacco Never Used    Labs: Recent Review Flowsheet Data    Labs for ITP Cardiac and Pulmonary Rehab Latest Ref Rng & Units 12/29/2014 01/29/2015 01/25/2016 06/26/2017 09/11/2017   Cholestrol 0 - 200 mg/dL - 177 167 188 -   LDLCALC 0 - 99 mg/dL - 96 91 107(H) -   LDLDIRECT mg/dL - - - - 84.0   HDL >39.00 mg/dL - 58.70 58.40 65.80 -   Trlycerides 0.0 - 149.0 mg/dL - 110.0 86.0 78.0 -   Hemoglobin A1c 4.6 - 6.1 % - - - - -   TCO2 0 - 100 mmol/L 22 - - - -       Pulmonary Assessment Scores: Pulmonary Assessment Scores    Row Name 12/05/17 1112         ADL UCSD   ADL Phase  Entry     SOB Score total  99     Rest  0     Walk  3     Stairs  5     [place]  5     Dress  5     Shop  5       CAT Score   CAT Score  25       mMRC Score   mMRC Score  2        Pulmonary Function Assessment: Pulmonary Function Assessment - 12/05/17 1116      Initial Spirometry Results   FVC%  64 %    FEV1%  63 %    FEV1/FVC Ratio  73.77    Comments  good patient effort      Post Bronchodilator Spirometry Results   FVC%  62.08 %    FEV1%  66.15 %    FEV1/FVC Ratio  80.48    Comments  good patient effort      Breath   Bilateral Breath Sounds  Clear    Shortness of Breath  Limiting activity;Yes       Exercise Target Goals:    Exercise Program Goal: Individual exercise prescription set using results from initial 6 min walk test and THRR while considering  patient's activity barriers and safety.    Exercise Prescription Goal: Initial exercise prescription builds to 30-45 minutes a day of aerobic activity, 2-3 days per week.  Home exercise guidelines will be given to patient during program as part of exercise prescription that the participant will acknowledge.  Activity Barriers & Risk Stratification:   6 Minute Walk: 6 Minute Walk    Row Name 12/05/17 1219         6 Minute Walk   Phase  Initial     Distance  765 feet  Walk  Time  6 minutes     # of Rest Breaks  0     MPH  1.45     METS  1.97     RPE  13     Perceived Dyspnea   3     Symptoms  Yes (comment)     Comments  pain outside of R leg - pt states due to stroke     Resting HR  95 bpm     Resting BP  120/68     Resting Oxygen Saturation   93 %     Exercise Oxygen Saturation  during 6 min walk  98 %     Max Ex. HR  126 bpm     Max Ex. BP  132/84     2 Minute Post BP  106/70       Oxygen Initial Assessment: Oxygen Initial Assessment - 12/05/17 1114      Home Oxygen   Home Oxygen Device  Home Concentrator;E-Tanks    Sleep Oxygen Prescription  Continuous    Liters per minute  1    Home Exercise Oxygen Prescription  None    Home at Rest Exercise Oxygen Prescription  None    Compliance with Home Oxygen Use  Yes      Initial 6 min Walk   Oxygen Used  None      Program Oxygen Prescription   Program Oxygen Prescription  None      Intervention   Short Term Goals  To learn and exhibit compliance with exercise, home and travel O2 prescription;To learn and understand importance of maintaining oxygen saturations>88%;To learn and demonstrate proper use of respiratory medications;To learn and demonstrate proper pursed lip breathing techniques or other breathing techniques.;To learn and understand importance of monitoring SPO2 with pulse oximeter and demonstrate accurate use of the pulse oximeter.    Long  Term Goals  Exhibits compliance with exercise, home and travel O2 prescription;Verbalizes importance of monitoring SPO2 with pulse oximeter and return demonstration;Maintenance of O2 saturations>88%;Exhibits proper breathing techniques, such as pursed lip breathing or other method taught during program session;Compliance with respiratory medication;Demonstrates proper use of MDI's       Oxygen Re-Evaluation: Oxygen Re-Evaluation    Row Name 12/13/17 0947             Program Oxygen Prescription   Program Oxygen Prescription  None         Home  Oxygen   Home Oxygen Device  Home Concentrator;E-Tanks       Sleep Oxygen Prescription  Continuous       Liters per minute  1       Home Exercise Oxygen Prescription  None       Home at Rest Exercise Oxygen Prescription  None       Compliance with Home Oxygen Use  Yes         Goals/Expected Outcomes   Short Term Goals  To learn and exhibit compliance with exercise, home and travel O2 prescription;To learn and understand importance of maintaining oxygen saturations>88%;To learn and demonstrate proper use of respiratory medications;To learn and demonstrate proper pursed lip breathing techniques or other breathing techniques.;To learn and understand importance of monitoring SPO2 with pulse oximeter and demonstrate accurate use of the pulse oximeter.       Long  Term Goals  Exhibits compliance with exercise, home and travel O2 prescription;Verbalizes importance of monitoring SPO2 with pulse oximeter and return demonstration;Maintenance of O2 saturations>88%;Exhibits proper breathing techniques,  such as pursed lip breathing or other method taught during program session;Compliance with respiratory medication;Demonstrates proper use of MDI's       Comments  Reviewed PLB technique with pt.  Talked about how it work and it's important to maintaining his exercise saturations.         Goals/Expected Outcomes  Short: Become more profiecient at using PLB.   Long: Become independent at using PLB.          Oxygen Discharge (Final Oxygen Re-Evaluation): Oxygen Re-Evaluation - 12/13/17 0947      Program Oxygen Prescription   Program Oxygen Prescription  None      Home Oxygen   Home Oxygen Device  Home Concentrator;E-Tanks    Sleep Oxygen Prescription  Continuous    Liters per minute  1    Home Exercise Oxygen Prescription  None    Home at Rest Exercise Oxygen Prescription  None    Compliance with Home Oxygen Use  Yes      Goals/Expected Outcomes   Short Term Goals  To learn and exhibit compliance  with exercise, home and travel O2 prescription;To learn and understand importance of maintaining oxygen saturations>88%;To learn and demonstrate proper use of respiratory medications;To learn and demonstrate proper pursed lip breathing techniques or other breathing techniques.;To learn and understand importance of monitoring SPO2 with pulse oximeter and demonstrate accurate use of the pulse oximeter.    Long  Term Goals  Exhibits compliance with exercise, home and travel O2 prescription;Verbalizes importance of monitoring SPO2 with pulse oximeter and return demonstration;Maintenance of O2 saturations>88%;Exhibits proper breathing techniques, such as pursed lip breathing or other method taught during program session;Compliance with respiratory medication;Demonstrates proper use of MDI's    Comments  Reviewed PLB technique with pt.  Talked about how it work and it's important to maintaining his exercise saturations.      Goals/Expected Outcomes  Short: Become more profiecient at using PLB.   Long: Become independent at using PLB.       Initial Exercise Prescription: Initial Exercise Prescription - 12/05/17 1200      Date of Initial Exercise RX and Referring Provider   Date  12/05/17    Referring Provider  Ashby Dawes      Treadmill   MPH  1    Grade  0    Minutes  15    METs  1.77      NuStep   Level  1    SPM  80    Minutes  15    METs  1.7      Biostep-RELP   Level  1    SPM  50    Minutes  15    METs  1.7      Track   Laps  17    Minutes  15    METs  1.7      Prescription Details   Frequency (times per week)  3    Duration  Progress to 45 minutes of aerobic exercise without signs/symptoms of physical distress      Intensity   THRR 40-80% of Max Heartrate  116-138    Ratings of Perceived Exertion  11-15    Perceived Dyspnea  0-4      Resistance Training   Training Prescription  Yes    Weight  2 lb    Reps  10-15       Perform Capillary Blood Glucose checks as  needed.  Exercise Prescription Changes: Exercise Prescription Changes  [place] Name 12/20/17 1100 01/03/18 1400           Response to Exercise   Blood Pressure (Admit)  124/74  124/72      Blood Pressure (Exercise)  106/64  -      Blood Pressure (Exit)  126/76  104/64      Heart Rate (Admit)  82 bpm  82 bpm      Heart Rate (Exercise)  122 bpm  101 bpm      Heart Rate (Exit)  96 bpm  100 bpm      Oxygen Saturation (Admit)  96 %  97 %      Oxygen Saturation (Exercise)  95 %  97 %      Oxygen Saturation (Exit)  98 %  96 %      Rating of Perceived Exertion (Exercise)  11  12      Perceived Dyspnea (Exercise)  0  0      Symptoms  none  none      Duration  Continue with 45 min of aerobic exercise without signs/symptoms of physical distress.  Continue with 45 min of aerobic exercise without signs/symptoms of physical distress.      Intensity  THRR unchanged  THRR unchanged        Progression   Progression  Continue to progress workloads to maintain intensity without signs/symptoms of physical distress.  -      Average METs  -  2        Resistance Training   Training Prescription  -  Yes      Weight  -  2 lb      Reps  -  10-15        Interval Training   Interval Training  -  No        Treadmill   MPH  0.8  1      Grade  0.5  0.5      Minutes  15  15      METs  -  1.83        NuStep   Level  -  2      SPM  -  73      Minutes  -  15      METs  -  2.3        Biostep-RELP   Level  2  2      SPM  36  34      Minutes  15  15      METs  1  2         Exercise Comments: Exercise Comments    Row Name 12/13/17 0946           Exercise Comments  First full day of exercise!  Patient was oriented to gym and equipment including functions, settings, policies, and procedures.  Patient's individual exercise prescription and treatment plan were reviewed.  All starting workloads were established based on the results of the 6 minute walk test done at initial orientation visit.  The  plan for exercise progression was also introduced and progression will be customized based on patient's performance and goals.          Exercise Goals and Review: Exercise Goals    Row Name 12/13/17 1018             Exercise Goals   Increase Physical Activity  Yes       Intervention  Provide advice, education,  support and counseling about physical activity/exercise needs.;Develop an individualized exercise prescription for aerobic and resistive training based on initial evaluation findings, risk stratification, comorbidities and participant's personal goals.       Expected Outcomes  Short Term: Attend rehab on a regular basis to increase amount of physical activity.;Long Term: Add in home exercise to make exercise part of routine and to increase amount of physical activity.;Long Term: Exercising regularly at least 3-5 days a week.       Increase Strength and Stamina  Yes       Intervention  Provide advice, education, support and counseling about physical activity/exercise needs.;Develop an individualized exercise prescription for aerobic and resistive training based on initial evaluation findings, risk stratification, comorbidities and participant's personal goals.       Expected Outcomes  Short Term: Increase workloads from initial exercise prescription for resistance, speed, and METs.;Short Term: Perform resistance training exercises routinely during rehab and add in resistance training at home;Long Term: Improve cardiorespiratory fitness, muscular endurance and strength as measured by increased METs and functional capacity (6MWT)       Able to understand and use rate of perceived exertion (RPE) scale  Yes       Intervention  Provide education and explanation on how to use RPE scale       Expected Outcomes  Short Term: Able to use RPE daily in rehab to express subjective intensity level;Long Term:  Able to use RPE to guide intensity level when exercising independently       Able to understand  and use Dyspnea scale  Yes       Intervention  Provide education and explanation on how to use Dyspnea scale       Expected Outcomes  Short Term: Able to use Dyspnea scale daily in rehab to express subjective sense of shortness of breath during exertion;Long Term: Able to use Dyspnea scale to guide intensity level when exercising independently       Knowledge and understanding of Target Heart Rate Range (THRR)  Yes       Intervention  Provide education and explanation of THRR including how the numbers were predicted and where they are located for reference       Expected Outcomes  Short Term: Able to state/look up THRR;Long Term: Able to use THRR to govern intensity when exercising independently;Short Term: Able to use daily as guideline for intensity in rehab       Able to check pulse independently  Yes       Intervention  Provide education and demonstration on how to check pulse in carotid and radial arteries.;Review the importance of being able to check your own pulse for safety during independent exercise       Expected Outcomes  Short Term: Able to explain why pulse checking is important during independent exercise;Long Term: Able to check pulse independently and accurately       Understanding of Exercise Prescription  Yes       Intervention  Provide education, explanation, and written materials on patient's individual exercise prescription       Expected Outcomes  Short Term: Able to explain program exercise prescription;Long Term: Able to explain home exercise prescription to exercise independently          Exercise Goals Re-Evaluation : Exercise Goals Re-Evaluation    Row Name 12/13/17 0932 12/20/17 1202 01/03/18 1437         Exercise Goal Re-Evaluation   Exercise Goals Review  Understanding of Exercise Prescription;Able  to understand and use Dyspnea scale;Knowledge and understanding of Target Heart Rate Range (THRR);Able to understand and use rate of perceived exertion (RPE) scale   Increase Physical Activity;Able to understand and use rate of perceived exertion (RPE) scale;Increase Strength and Stamina;Able to understand and use Dyspnea scale  Increase Physical Activity;Able to understand and use rate of perceived exertion (RPE) scale;Able to understand and use Dyspnea scale;Increase Strength and Stamina     Comments  Reviewed RPE scale, THR and program prescription with pt today.  Pt voiced understanding and was given a copy of goals to take home.   Pt is tolerating exercise well.  Staff will continue to monitor progress.    Greydis tolerates exercise well.  Based on HR and RPE, Candee can increase levels on machines.  Staff will monitor progress.      Expected Outcomes  Short: Use RPE daily to regulate intensity.  Long: Follow program prescription in THR.  Short - Pt will attend 3 days per week Long - Pt will complete LW and improve overall MET level  Short - Elmarie will increase overall MET level Long - Pt will exercise on her own        Discharge Exercise Prescription (Final Exercise Prescription Changes): Exercise Prescription Changes - 01/03/18 1400      Response to Exercise   Blood Pressure (Admit)  124/72    Blood Pressure (Exit)  104/64    Heart Rate (Admit)  82 bpm    Heart Rate (Exercise)  101 bpm    Heart Rate (Exit)  100 bpm    Oxygen Saturation (Admit)  97 %    Oxygen Saturation (Exercise)  97 %    Oxygen Saturation (Exit)  96 %    Rating of Perceived Exertion (Exercise)  12    Perceived Dyspnea (Exercise)  0    Symptoms  none    Duration  Continue with 45 min of aerobic exercise without signs/symptoms of physical distress.    Intensity  THRR unchanged      Progression   Average METs  2      Resistance Training   Training Prescription  Yes    Weight  2 lb    Reps  10-15      Interval Training   Interval Training  No      Treadmill   MPH  1    Grade  0.5    Minutes  15    METs  1.83      NuStep   Level  2    SPM  73    Minutes  15     METs  2.3      Biostep-RELP   Level  2    SPM  34    Minutes  15    METs  2       Nutrition:  Target Goals: Understanding of nutrition guidelines, daily intake of sodium <1581m, cholesterol <2087m calories 30% from fat and 7% or less from saturated fats, daily to have 5 or more servings of fruits and vegetables.  Biometrics: Pre Biometrics - 12/05/17 1217      Pre Biometrics   Height  _0  (1.549 m)    Weight  153 lb 12.8 oz (69.8 kg)    Waist Circumference  35.5 inches    Hip Circumference  46.5 inches    Waist to Hip Ratio  0.76 %    BMI (Calculated)  29.08        Nutrition  Therapy Plan and Nutrition Goals: Nutrition Therapy & Goals - 12/05/17 1112      Personal Nutrition Goals   Comments  Lose weight, Learn healthier eating habits.      Intervention Plan   Intervention  Prescribe, educate and counsel regarding individualized specific dietary modifications aiming towards targeted core components such as weight, hypertension, lipid management, diabetes, heart failure and other comorbidities.;Nutrition handout(s) given to patient.    Expected Outcomes  Short Term Goal: Understand basic principles of dietary content, such as calories, fat, sodium, cholesterol and nutrients.;Long Term Goal: Adherence to prescribed nutrition plan.       Nutrition Assessments:   Nutrition Goals Re-Evaluation:   Nutrition Goals Discharge (Final Nutrition Goals Re-Evaluation):   Psychosocial: Target Goals: Acknowledge presence or absence of significant depression and/or stress, maximize coping skills, provide positive support system. Participant is able to verbalize types and ability to use techniques and skills needed for reducing stress and depression.   Initial Review & Psychosocial Screening: Initial Psych Review & Screening - 12/05/17 1110      Initial Review   Current issues with  Current Depression;History of Depression;Current Psychotropic Meds;Current Sleep  Concerns;Current Stress Concerns    Source of Stress Concerns  Chronic Illness;Unable to perform yard/household activities;Unable to participate in former interests or hobbies    Comments  She takes Zoloft to help her depression.      Family Dynamics   Good Support System?  Yes    Comments  Looks to her daughters and grandaughters for support      Barriers   Psychosocial barriers to participate in program  The patient should benefit from training in stress management and relaxation.      Screening Interventions   Interventions  Encouraged to exercise;Provide feedback about the scores to participant;Program counselor consult;To provide support and resources with identified psychosocial needs    Expected Outcomes  Short Term goal: Utilizing psychosocial counselor, staff and physician to assist with identification of specific Stressors or current issues interfering with healing process. Setting desired goal for each stressor or current issue identified.;Long Term Goal: Stressors or current issues are controlled or eliminated.;Short Term goal: Identification and review with participant of any Quality of Life or Depression concerns found by scoring the questionnaire.;Long Term goal: The participant improves quality of Life and PHQ9 Scores as seen by post scores and/or verbalization of changes       Quality of Life Scores:  Scores of 19 and below usually indicate a poorer quality of life in these areas.  A difference of  2-3 points is a clinically meaningful difference.  A difference of 2-3 points in the total score of the Quality of Life Index has been associated with significant improvement in overall quality of life, self-image, physical symptoms, and general health in studies assessing change in quality of life.  PHQ-9: Recent Review Flowsheet Data    Depression screen [facility] 2/9 12/13/2017 12/05/2017 06/26/2017 02/01/2016 01/29/2015   Decreased Interest 1 1 0 0 0   Down, Depressed, Hopeless 0 2 1 0 3     PHQ - 2 Score _0 0 3   Altered sleeping 0 2 1 - 3    Tired, decreased energy 2 3 0 - 3   Change in appetite 2 2 0 - 0    Feeling bad or failure about yourself  1 2 0 - 3    Trouble concentrating 1 1 0 - 1   Moving slowly or fidgety/restless 2 2 0 -  3    Suicidal thoughts 0 0 0 - 0   PHQ-9 Score _0 - 16   Difficult doing work/chores Somewhat difficult Somewhat difficult Not difficult at all - Somewhat difficult     Interpretation of Total Score  Total Score Depression Severity:  1-4 = Minimal depression, 5-9 = Mild depression, 10-14 = Moderate depression, 15-19 = Moderately severe depression, 20-27 = Severe depression   Psychosocial Evaluation and Intervention: Psychosocial Evaluation - 12/13/17 1115      Psychosocial Evaluation & Interventions   Interventions  Encouraged to exercise with the program and follow exercise prescription    Comments  Counselor met today with Ms. Nobel [facility]) for initial psychosocial evaluation.  She is a 72 year old who was recently diagnosed with COPD.  Celestial has a strong support system with two daughters and a niece who live close by and she is involved in her local church.  Myla describes herself as being in "good health" overall other than a recent back surgery and now COPD. She sleeps "better" lately and has a "fair" appetite.  She reports a history of depression over the past several years and has been on medications to help with her mood - which she states is generally positive lately.   Counselor mentioned her PHQ-9 scores of "15" indicating moderately severe symptoms of depression; and reviewed some of the questions with her reporting sleeping better and no longer depressed or hopeless.  She reported feeling better about herself and has more energy lately.  As a result her scores dropped to a "9" indicating some mild depressive symptoms are present.  Her main stress is primarily her health currently.   Trenyce would like to breathe and walk  better while in this program.  She will be followed by staff throughout the course of this program.      Expected Outcomes  Jeanie will benefit from consistent exercise to achieve her stated goals.  She will continue to take her medications and exercise for her mental health as well.  The educational and psychoeducational components of this program will be helpful in Gunter understanding and coping more positively with her condition.      Continue Psychosocial Services   Follow up required by staff       Psychosocial Re-Evaluation:   Psychosocial Discharge (Final Psychosocial Re-Evaluation):   Education: Education Goals: Education classes will be provided on a weekly basis, covering required topics. Participant will state understanding/return demonstration of topics presented.  Learning Barriers/Preferences: Learning Barriers/Preferences - 12/05/17 1114      Learning Barriers/Preferences   Learning Barriers  Sight wears reading glasses    Learning Preferences  None       Education Topics:  Initial Evaluation Education: - Verbal, written and demonstration of respiratory meds, oximetry and breathing techniques. Instruction on use of nebulizers and MDIs and importance of monitoring MDI activations.   Pulmonary Rehab from 12/25/2017 in [facility] Cardiac and Pulmonary Rehab  Date  12/05/17  Educator  [facility]  Instruction Review Code  1- Verbalizes Understanding      General Nutrition Guidelines/Fats and Fiber: -Group instruction provided by verbal, written material, models and posters to present the general guidelines for heart healthy nutrition. Gives an explanation and review of dietary fats and fiber.   Controlling Sodium/Reading Food Labels: -Group verbal and written material supporting the discussion of sodium use in heart healthy nutrition. Review and explanation with models, verbal and written materials for utilization of the food label.  Pulmonary Rehab from 12/25/2017 in [facility] Cardiac  and Pulmonary Rehab  Date  12/25/17  Educator  [facility]  Instruction Review Code  1- Verbalizes Understanding      Exercise Physiology & General Exercise Guidelines: - Group verbal and written instruction with models to review the exercise physiology of the cardiovascular system and associated critical values. Provides general exercise guidelines with specific guidelines to those with heart or lung disease.    Aerobic Exercise & Resistance Training: - Gives group verbal and written instruction on the various components of exercise. Focuses on aerobic and resistive training programs and the benefits of this training and how to safely progress through these programs.   Flexibility, Balance, Mind/Body Relaxation: Provides group verbal/written instruction on the benefits of flexibility and balance training, including mind/body exercise modes such as yoga, pilates and tai chi.  Demonstration and skill practice provided.   Stress and Anxiety: - Provides group verbal and written instruction about the health risks of elevated stress and causes of high stress.  Discuss the correlation between heart/lung disease and anxiety and treatment options. Review healthy ways to manage with stress and anxiety.   Depression: - Provides group verbal and written instruction on the correlation between heart/lung disease and depressed mood, treatment options, and the stigmas associated with seeking treatment.   Exercise & Equipment Safety: - Individual verbal instruction and demonstration of equipment use and safety with use of the equipment.   Pulmonary Rehab from 12/25/2017 in [facility] Cardiac and Pulmonary Rehab  Date  12/05/17  Educator  [facility]  Instruction Review Code  1- Verbalizes Understanding      Infection Prevention: - Provides verbal and written material to individual with discussion of infection control including proper hand washing and proper equipment cleaning during exercise session.   Pulmonary Rehab  from 12/25/2017 in [facility] Cardiac and Pulmonary Rehab  Date  12/05/17  Educator  [facility]  Instruction Review Code  1- Verbalizes Understanding      Falls Prevention: - Provides verbal and written material to individual with discussion of falls prevention and safety.   Pulmonary Rehab from 12/25/2017 in [facility] Cardiac and Pulmonary Rehab  Date  12/05/17  Educator  [facility]  Instruction Review Code  1- Verbalizes Understanding      Diabetes: - Individual verbal and written instruction to review signs/symptoms of diabetes, desired ranges of glucose level fasting, after meals and with exercise. Advice that pre and post exercise glucose checks will be done for 3 sessions at entry of program.   Chronic Lung Diseases: - Group verbal and written instruction to review updates, respiratory medications, advancements in procedures and treatments. Discuss use of supplemental oxygen including available portable oxygen systems, continuous and intermittent flow rates, concentrators, personal use and safety guidelines. Review proper use of inhaler and spacers. Provide informative websites for self-education.    Pulmonary Rehab from 12/25/2017 in [facility] Cardiac and Pulmonary Rehab  Date  12/20/17  Educator  [facility]  Instruction Review Code  1- Verbalizes Understanding      Energy Conservation: - Provide group verbal and written instruction for methods to conserve energy, plan and organize activities. Instruct on pacing techniques, use of adaptive equipment and posture/positioning to relieve shortness of breath.   Triggers and Exacerbations: - Group verbal and written instruction to review types of environmental triggers and ways to prevent exacerbations. Discuss weather changes, air quality and the benefits of nasal washing. Review warning signs and symptoms to help prevent infections. Discuss techniques for effective airway clearance, coughing, and  vibrations.   AED/CPR: - Group verbal and written instruction with the use  of models to demonstrate the basic use of the AED with the basic ABC's of resuscitation.   Anatomy and Physiology of the Lungs: - Group verbal and written instruction with the use of models to provide basic lung anatomy and physiology related to function, structure and complications of lung disease.   Anatomy & Physiology of the Heart: - Group verbal and written instruction and models provide basic cardiac anatomy and physiology, with the coronary electrical and arterial systems. Review of Valvular disease and Heart Failure   Cardiac Medications: - Group verbal and written instruction to review commonly prescribed medications for heart disease. Reviews the medication, class of the drug, and side effects.   Know Your Numbers and Risk Factors: -Group verbal and written instruction about important numbers in your health.  Discussion of what are risk factors and how they play a role in the disease process.  Review of Cholesterol, Blood Pressure, Diabetes, and BMI and the role they play in your overall health.   Sleep Hygiene: -Provides group verbal and written instruction about how sleep can affect your health.  Define sleep hygiene, discuss sleep cycles and impact of sleep habits. Review good sleep hygiene tips.    Other: -Provides group and verbal instruction on various topics (see comments)    Knowledge Questionnaire Score: Knowledge Questionnaire Score - 12/05/17 1114      Knowledge Questionnaire Score   Pre Score  15/18 Reviewed with patient        Core Components/Risk Factors/Patient Goals at Admission: Personal Goals and Risk Factors at Admission - 12/05/17 1115      Core Components/Risk Factors/Patient Goals on Admission    Weight Management  Yes;Weight Maintenance;Weight Loss    Intervention  Weight Management: Develop a combined nutrition and exercise program designed to reach desired caloric intake, while maintaining appropriate intake of nutrient and fiber, sodium and  fats, and appropriate energy expenditure required for the weight goal.;Weight Management: Provide education and appropriate resources to help participant work on and attain dietary goals.;Weight Management/Obesity: Establish reasonable short term and long term weight goals.    Admit Weight  153 lb 12.8 oz (69.8 kg)    Goal Weight: Short Term  148 lb (67.1 kg)    Goal Weight: Long Term  130 lb (59 kg)    Expected Outcomes  Short Term: Continue to assess and modify interventions until short term weight is achieved;Long Term: Adherence to nutrition and physical activity/exercise program aimed toward attainment of established weight goal;Weight Maintenance: Understanding of the daily nutrition guidelines, which includes 25-35% calories from fat, 7% or less cal from saturated fats, less than 253m cholesterol, less than 1.5gm of sodium, & 5 or more servings of fruits and vegetables daily;Weight Loss: Understanding of general recommendations for a balanced deficit meal plan, which promotes 1-2 lb weight loss per week and includes a negative energy balance of [phone] kcal/d;Understanding recommendations for meals to include 15-35% energy as protein, 25-35% energy from fat, 35-60% energy from carbohydrates, less than 2027mof dietary cholesterol, 20-35 gm of total fiber daily;Understanding of distribution of calorie intake throughout the day with the consumption of 4-5 meals/snacks    Improve shortness of breath with ADL's  Yes    Intervention  Provide education, individualized exercise plan and daily activity instruction to help decrease symptoms of SOB with activities of daily living.    Expected Outcomes  Short Term: Improve cardiorespiratory fitness to achieve a  reduction of symptoms when performing ADLs;Long Term: Be able to perform more ADLs without symptoms or delay the onset of symptoms    Heart Failure  Yes    Intervention  Provide a combined exercise and nutrition program that is supplemented with  education, support and counseling about heart failure. Directed toward relieving symptoms such as shortness of breath, decreased exercise tolerance, and extremity edema.    Expected Outcomes  Improve functional capacity of life;Short term: Attendance in program 2-3 days a week with increased exercise capacity. Reported lower sodium intake. Reported increased fruit and vegetable intake. Reports medication compliance.;Short term: Daily weights obtained and reported for increase. Utilizing diuretic protocols set by physician.;Long term: Adoption of self-care skills and reduction of barriers for early signs and symptoms recognition and intervention leading to self-care maintenance.    Hypertension  Yes    Intervention  Provide education on lifestyle modifcations including regular physical activity/exercise, weight management, moderate sodium restriction and increased consumption of fresh fruit, vegetables, and low fat dairy, alcohol moderation, and smoking cessation.;Monitor prescription use compliance.    Expected Outcomes  Short Term: Continued assessment and intervention until BP is < 140/71m HG in hypertensive participants. < 130/871mHG in hypertensive participants with diabetes, heart failure or chronic kidney disease.;Long Term: Maintenance of blood pressure at goal levels.       Core Components/Risk Factors/Patient Goals Review:    Core Components/Risk Factors/Patient Goals at Discharge (Final Review):    ITP Comments: ITP Comments    Row Name 12/05/17 1045 01/01/18 0828 01/15/18 0820 01/29/18 0824     ITP Comments  Medical Evaluation completed. Chart sent for review and changes to Dr. MaEmily Filbertirector of LuFincastleDiagnosis can be found in [facility] encounter 11/21/17   30 day review completed. ITP sent to Dr. MaEmily Filbertirector of LuPine GroveContinue with ITP unless changes are made by physician  Called patient to see if she would be returning to Lu[place]anNo answer/busy tone. Unable to  leave message. Patient has not attended since 12/25/17.  patient has not returned to class since 01/01/18.  30 day review completed. ITP sent to Dr. MaEmily Filbertirector of LuPeoriaContinue with ITP unless changes are made by physician       Comments: 30 day review

## 2018-01-29 NOTE — Telephone Encounter (Signed)
Copied from CRM [id number]. Topic: Quick Communication - See Telephone Encounter >> Jan 29, 2018  2:19 PM Waymon Amato wrote: Pt is needing a refill on her lasix  [phone]  Best number [phone]

## 2018-01-29 NOTE — Telephone Encounter (Signed)
Patient's daughter is calling to report that her mother -patient has been taking her medications incorrectly. Patient had thought she had increased her Lasix- but she has been taking triple her dose of her potassium.  She has been doing this for at least 4-5 days. Patient seems to be loopy at this time. Her other daughter is with her now- and they have stopped her potasium. Patient does have some swelling on her ankles and legs. Daughter did not notice that she is having trouble breathing. Patient was sweating- but her house was 82 degrees. Her daughter turned on the air. Patient is actually out of lasix- she has called for refill. Appointment made for 8:30 in the morning for patient to be evaluated and for possible labs.

## 2018-01-30 ENCOUNTER — Other Ambulatory Visit (INDEPENDENT_AMBULATORY_CARE_PROVIDER_SITE_OTHER): Payer: Medicare HMO

## 2018-01-30 ENCOUNTER — Other Ambulatory Visit: Payer: Self-pay | Admitting: Family Medicine

## 2018-01-30 ENCOUNTER — Other Ambulatory Visit: Payer: Self-pay

## 2018-01-30 ENCOUNTER — Ambulatory Visit (INDEPENDENT_AMBULATORY_CARE_PROVIDER_SITE_OTHER): Payer: Medicare HMO | Admitting: Family Medicine

## 2018-01-30 ENCOUNTER — Encounter: Payer: Self-pay | Admitting: Family Medicine

## 2018-01-30 VITALS — BP 140/90 | HR 71 | Temp 98.3°F | Ht 60.5 in | Wt 156.8 lb

## 2018-01-30 DIAGNOSIS — L304 Erythema intertrigo: Secondary | ICD-10-CM | POA: Insufficient documentation

## 2018-01-30 DIAGNOSIS — E039 Hypothyroidism, unspecified: Secondary | ICD-10-CM | POA: Diagnosis not present

## 2018-01-30 DIAGNOSIS — E875 Hyperkalemia: Secondary | ICD-10-CM | POA: Insufficient documentation

## 2018-01-30 DIAGNOSIS — B372 Candidiasis of skin and nail: Secondary | ICD-10-CM | POA: Diagnosis not present

## 2018-01-30 DIAGNOSIS — R609 Edema, unspecified: Secondary | ICD-10-CM | POA: Diagnosis not present

## 2018-01-30 DIAGNOSIS — R413 Other amnesia: Secondary | ICD-10-CM | POA: Diagnosis not present

## 2018-01-30 DIAGNOSIS — I5032 Chronic diastolic (congestive) heart failure: Secondary | ICD-10-CM

## 2018-01-30 DIAGNOSIS — I519 Heart disease, unspecified: Secondary | ICD-10-CM | POA: Diagnosis not present

## 2018-01-30 LAB — CBC WITH DIFFERENTIAL/PLATELET
Basophils Absolute: 0.1 10*3/uL (ref 0.0–0.1)
Basophils Relative: 1.5 % (ref 0.0–3.0)
Eosinophils Absolute: 0.2 10*3/uL (ref 0.0–0.7)
Eosinophils Relative: 2.3 % (ref 0.0–5.0)
HCT: 37.7 % (ref 36.0–46.0)
Hemoglobin: 12.6 g/dL (ref 12.0–15.0)
Lymphocytes Relative: 18.7 % (ref 12.0–46.0)
Lymphs Abs: 1.4 10*3/uL (ref 0.7–4.0)
MCHC: 33.3 g/dL (ref 30.0–36.0)
MCV: 90 fl (ref 78.0–100.0)
Monocytes Absolute: 0.7 10*3/uL (ref 0.1–1.0)
Monocytes Relative: 8.7 % (ref 3.0–12.0)
Neutro Abs: 5.3 10*3/uL (ref 1.4–7.7)
Neutrophils Relative %: 68.8 % (ref 43.0–77.0)
Platelets: 161 10*3/uL (ref 150.0–400.0)
RBC: 4.19 Mil/uL (ref 3.87–5.11)
RDW: 14.7 % (ref 11.5–15.5)
WBC: 7.6 10*3/uL (ref 4.0–10.5)

## 2018-01-30 LAB — POC URINALSYSI DIPSTICK (AUTOMATED)
Bilirubin, UA: NEGATIVE
Glucose, UA: NEGATIVE
Ketones, UA: NEGATIVE
Nitrite, UA: POSITIVE
Protein, UA: NEGATIVE
Spec Grav, UA: 1.02 (ref 1.010–1.025)
Urobilinogen, UA: 0.2 E.U./dL
pH, UA: 6 (ref 5.0–8.0)

## 2018-01-30 LAB — COMPREHENSIVE METABOLIC PANEL
ALT: 11 U/L (ref 0–35)
AST: 21 U/L (ref 0–37)
Albumin: 4.1 g/dL (ref 3.5–5.2)
Alkaline Phosphatase: 100 U/L (ref 39–117)
BUN: 16 mg/dL (ref 6–23)
CO2: 29 mEq/L (ref 19–32)
Calcium: 10.4 mg/dL (ref 8.4–10.5)
Chloride: 102 mEq/L (ref 96–112)
Creatinine, Ser: 0.86 mg/dL (ref 0.40–1.20)
GFR: 69.06 mL/min (ref >60.00)
Glucose, Bld: 82 mg/dL (ref 70–99)
Potassium: 5.8 mEq/L — ABNORMAL HIGH (ref 3.5–5.1)
Sodium: 136 mEq/L (ref 135–145)
Total Bilirubin: 0.4 mg/dL (ref 0.2–1.2)
Total Protein: 7.6 g/dL (ref 6.0–8.3)

## 2018-01-30 LAB — VITAMIN B12: Vitamin B-12: 1351 pg/mL — ABNORMAL HIGH (ref 211–911)

## 2018-01-30 LAB — TSH: TSH: 5.51 u[IU]/mL — ABNORMAL HIGH (ref 0.35–4.50)

## 2018-01-30 LAB — T4, FREE: Free T4: 0.99 ng/dL (ref 0.60–1.60)

## 2018-01-30 LAB — VITAMIN D 25 HYDROXY (VIT D DEFICIENCY, FRACTURES): VITD: 37.51 ng/mL (ref 30.00–100.00)

## 2018-01-30 LAB — T3, FREE: T3, Free: 3.7 pg/mL (ref 2.3–4.2)

## 2018-01-30 MED ORDER — NYSTATIN 100000 UNIT/GM EX CREA: 1.0000 "application " | TOPICAL_CREAM | Freq: Two times a day (BID) | CUTANEOUS | 1 refills | Status: DC

## 2018-01-30 MED ORDER — FUROSEMIDE 20 MG PO TABS: 20.0000 mg | ORAL_TABLET | ORAL | 0 refills | Status: DC | PRN

## 2018-01-30 MED ORDER — POTASSIUM CHLORIDE ER 20 MEQ PO TBCR: 40.0000 meq | EXTENDED_RELEASE_TABLET | ORAL | 0 refills | Status: DC | PRN

## 2018-01-30 NOTE — Addendum Note (Signed)
Addended by: Damita Lack on: 01/30/2018 09:33 AM   Modules accepted: Orders

## 2018-01-30 NOTE — Assessment & Plan Note (Signed)
Pt denies  But daughter has noted gradual cahnge.  Will cahk UA, and labs for secondary cause. Pt will return for memory testing with Dr. Reece Agar in next few months.

## 2018-01-30 NOTE — Assessment & Plan Note (Signed)
Treat with topical nystatin. 

## 2018-01-30 NOTE — Telephone Encounter (Signed)
Dr Ermalene Searing said to refill Lasix # 40 [name] per DPR notified refill done and pt should take one dose of Lasix today per Dr Ermalene Searing instruction from office visit earlier today. [name] voiced understanding.

## 2018-01-30 NOTE — Telephone Encounter (Signed)
Seen in office today by Dr. Ermalene Searing; was to have labs drawn.  Reported out of Lasix; last filled 11/20/17; #40; no refills. PCP: Dr. Sharen Hones [facility]: [facility] [address]

## 2018-01-30 NOTE — Assessment & Plan Note (Signed)
Likely multifactorial from decreased mobility, varicose veins, diastolic heart failure etc.  Will eval for other causes with labs.

## 2018-01-30 NOTE — Patient Instructions (Signed)
Keep area between breasts dry.  Apply topical nystatin cream.   Please stop at the lab to have labs drawn.  Take one dose of lasix today when picked up from pharmacy. ( Do not take potassium with it this time)

## 2018-01-30 NOTE — Assessment & Plan Note (Signed)
Eval with labs and EKG.  No red flags.  Will likely treat with lasix.

## 2018-01-30 NOTE — Addendum Note (Signed)
Addended by: Damita Lack on: 01/30/2018 09:22 AM   Modules accepted: Orders

## 2018-01-30 NOTE — Progress Notes (Unsigned)
Potassium rx sent.  Thanks.

## 2018-01-30 NOTE — Progress Notes (Signed)
Subjective:    Patient ID: [name] [name], female    DOB: 12-24-1945, 72 y.o.   MRN: [id number]  HPI    72 year old female pt pf Dr. Timoteo Expose with COPD, MDD, hx CVA with hemiparesis, HTN,  Hypothyroid diastolic heart failure ( EF 55-60% in 10/2017) presents with recent confusion over medication.  She has been taking 2- 3  doses  of potassium ( 20 mEQ) for the last 4-5 days. She had thought she  was  Taking lasix given recent weight gain and swelling in ankles.  The potassium bottle said  "take with lasix" she thought it was lasix.  She is out of lasix.   No new chest pain, no new SOB.  No new neuro changes.  no dysuria, no  change in urination.  She presents today with her daughter who clarifies story.  Daughter report she has been more confused in last 3-4 months, gradually.  Pt does not some mild memoy loss but denies confusion.   She has a new rash  Between breasts x 2 weeks. Red,  Sore.  Applying Lotrimin and baby powder.  Blood pressure 140/90, pulse 71, temperature 98.3 F (36.8 C), temperature source Oral, height 5' 0.5" (1.537 m), weight 156 lb 12 oz (71.1 kg).  Review of Systems  Constitutional: Negative for fatigue and fever.  HENT: Negative for congestion.   Eyes: Negative for pain.  Respiratory: Negative for cough and shortness of breath.   Cardiovascular: Positive for leg swelling. Negative for chest pain and palpitations.  Gastrointestinal: Negative for abdominal pain.  Genitourinary: Negative for dysuria and vaginal bleeding.  Musculoskeletal: Negative for back pain.  Skin: Positive for rash.  Neurological: Negative for syncope, light-headedness and headaches.  Psychiatric/Behavioral: Negative for dysphoric mood.       Objective:   Physical Exam  Constitutional: She is oriented to person, place, and time. Vital signs are normal. She appears well-developed and well-nourished. She is cooperative.  Non-toxic appearance. She does not appear ill. No distress.    HENT:  Head: Normocephalic.  Right Ear: Hearing, tympanic membrane, external ear and ear canal normal. Tympanic membrane is not erythematous, not retracted and not bulging.  Left Ear: Hearing, tympanic membrane, external ear and ear canal normal. Tympanic membrane is not erythematous, not retracted and not bulging.  Nose: No mucosal edema or rhinorrhea. Right sinus exhibits no maxillary sinus tenderness and no frontal sinus tenderness. Left sinus exhibits no maxillary sinus tenderness and no frontal sinus tenderness.  Mouth/Throat: Uvula is midline, oropharynx is clear and moist and mucous membranes are normal.  Eyes: Pupils are equal, round, and reactive to light. Conjunctivae, EOM and lids are normal. Lids are everted and swept, no foreign bodies found.  Neck: Trachea normal and normal range of motion. Neck supple. Carotid bruit is not present. No thyroid mass and no thyromegaly present.  Cardiovascular: Normal rate, regular rhythm, S1 normal, S2 normal, normal heart sounds, intact distal pulses and normal pulses. Exam reveals no gallop and no friction rub.  No murmur heard. Pulmonary/Chest: Effort normal and breath sounds normal. No tachypnea. No respiratory distress. She has no decreased breath sounds. She has no wheezes. She has no rhonchi. She has no rales.  Abdominal: Soft. Normal appearance and bowel sounds are normal. There is no tenderness.  Neurological: She is alert and oriented to person, place, and time. She has normal strength. No sensory deficit.  Skin: Skin is warm, dry and intact. No rash noted.  Erythematous macerated skin  between breasts  Psychiatric: Her speech is normal and behavior is normal. Judgment and thought content normal. Her mood appears not anxious. Cognition and memory are normal. She does not exhibit a depressed mood.    Varicose veins bilaterally and 1 plus nonpitting edema bilaterally.      Assessment & Plan:

## 2018-02-01 ENCOUNTER — Telehealth: Payer: Self-pay

## 2018-02-01 NOTE — Telephone Encounter (Signed)
LMOM

## 2018-02-02 ENCOUNTER — Other Ambulatory Visit: Payer: Self-pay | Admitting: Family Medicine

## 2018-02-02 ENCOUNTER — Other Ambulatory Visit (INDEPENDENT_AMBULATORY_CARE_PROVIDER_SITE_OTHER): Payer: Medicare HMO

## 2018-02-02 DIAGNOSIS — E875 Hyperkalemia: Secondary | ICD-10-CM

## 2018-02-02 LAB — URINE CULTURE
MICRO NUMBER:: 90555643
SPECIMEN QUALITY:: ADEQUATE

## 2018-02-02 LAB — POTASSIUM: Potassium: 4.8 mEq/L (ref 3.5–5.1)

## 2018-02-02 MED ORDER — CEPHALEXIN 500 MG PO CAPS: 500.0000 mg | ORAL_CAPSULE | Freq: Three times a day (TID) | ORAL | 0 refills | Status: DC

## 2018-02-02 NOTE — Progress Notes (Signed)
Let pt know rx sent in. Start ASAP.

## 2018-02-02 NOTE — Addendum Note (Signed)
Addended by: Alvina Chou on: 02/02/2018 10:44 AM   Modules accepted: Orders

## 2018-02-08 ENCOUNTER — Telehealth: Payer: Self-pay | Admitting: *Deleted

## 2018-02-08 ENCOUNTER — Encounter: Payer: Self-pay | Admitting: *Deleted

## 2018-02-08 DIAGNOSIS — J449 Chronic obstructive pulmonary disease, unspecified: Secondary | ICD-10-CM

## 2018-02-08 NOTE — Progress Notes (Signed)
Pulmonary Individual Treatment Plan  Patient Details  Name: [name] [name] MRN: [id number] Date of Birth: 09/05/1946 Referring Provider:     Pulmonary Rehab from 12/05/2017 in [facility] Cardiac and Pulmonary Rehab  Referring Provider  Ramachandran      Initial Encounter Date:    Pulmonary Rehab from 12/05/2017 in [facility] Cardiac and Pulmonary Rehab  Date  12/05/17  Referring Provider  Ashby Dawes      Visit Diagnosis: Chronic obstructive pulmonary disease, unspecified COPD type ([place])  Patient's Home Medications on Admission:  Current Outpatient Medications:  .  acetaminophen (TYLENOL) 500 MG tablet, Take 500 mg by mouth 2 (two) times daily as needed for mild pain. , Disp: , Rfl:  .  albuterol (PROVENTIL HFA;VENTOLIN HFA) 108 (90 Base) MCG/ACT inhaler, Inhale 2 puffs into the lungs every 6 (six) hours as needed for wheezing or shortness of breath., Disp: 1 Inhaler, Rfl: 2 .  alendronate (FOSAMAX) 70 MG tablet, TAKE 1 TABLET WEEKLY  WITH A FULL GLASS OF WATER ON AN EMPTY STOMACH, Disp: 12 tablet, Rfl: 3 .  aspirin EC 81 MG tablet, Take 81 mg by mouth daily., Disp: , Rfl:  .  atorvastatin (LIPITOR) 40 MG tablet, TAKE 1 TABLET (40 MG TOTAL) BY MOUTH DAILY., Disp: 90 tablet, Rfl: 2 .  benazepril (LOTENSIN) 5 MG tablet, Take 1 tablet (5 mg total) by mouth daily., Disp: 30 tablet, Rfl: 3 .  Calcium Carb-Cholecalciferol (CALCIUM-VITAMIN D) 600-400 MG-UNIT TABS, Take 1 tablet by mouth daily., Disp: , Rfl:  .  cephALEXin (KEFLEX) 500 MG capsule, Take 1 capsule (500 mg total) by mouth 3 (three) times daily., Disp: 21 capsule, Rfl: 0 .  Cholecalciferol (VITAMIN D PO), Take 1 capsule by mouth daily., Disp: , Rfl:  .  clopidogrel (PLAVIX) 75 MG tablet, TAKE 1 TABLET EVERY DAY, Disp: 90 tablet, Rfl: 1 .  fexofenadine (ALLEGRA) 180 MG tablet, Take 180 mg by mouth daily as needed for allergies. , Disp: , Rfl:  .  fluticasone-salmeterol (ADVAIR HFA) 115-21 MCG/ACT inhaler, Inhale 2 puffs into the lungs 2  (two) times daily. Rinse mouth after use., Disp: 1 Inhaler, Rfl: 12 .  furosemide (LASIX) 20 MG tablet, Take 1 tablet (20 mg total) by mouth as needed for fluid or edema. May take once or twice a week for swelling, Disp: 40 tablet, Rfl: 0 .  levothyroxine (SYNTHROID, LEVOTHROID) 25 MCG tablet, Take 1 tablet (25 mcg total) by mouth daily before breakfast., Disp: 90 tablet, Rfl: 1 .  nystatin cream (MYCOSTATIN), Apply 1 application topically 2 (two) times daily., Disp: 30 g, Rfl: 1 .  Potassium Chloride ER 20 MEQ TBCR, Take 40 mEq by mouth as needed (whenever you take lasix)., Disp: 40 tablet, Rfl: 0 .  sertraline (ZOLOFT) 100 MG tablet, Take 1 tablet (100 mg total) by mouth daily., Disp: 90 tablet, Rfl: 3 .  traMADol (ULTRAM) 50 MG tablet, Take 50-100 mg by mouth every 6 (six) hours as needed for moderate pain. , Disp: , Rfl:  .  traZODone (DESYREL) 100 MG tablet, Take 1 tablet (100 mg total) by mouth at bedtime. for sleep, Disp: 90 tablet, Rfl: 1  Current Facility-Administered Medications:  .  0.9 %  sodium chloride infusion, 500 mL, Intravenous, Continuous, Danis, Kirke Corin, MD  Past Medical History: Past Medical History:  Diagnosis Date  . Aneurysm ([place])   . Anxiety   . Anxiety and depression   . Carotid stenosis    R 50% (12/2012)  . Concussion 08/03/2015  .  COPD (chronic obstructive pulmonary disease) ([place]) 12/2012   spirometry: Pre: FVC 84%, FEV1 69%, ratio 0.64 consistent with moderate obstruction.  . Depression   . Fall 08/03/2015   d/c home health 08/2015  . Fracture of cervical vertebra, C5 (HCC) 08/06/2015  . History of chicken pox   . Hyperlipidemia   . Hypertension   . Lower back pain    h/o HNP s/p surgery  . Osteoarthritis    h/o ruptured disc s/p ESI  . Osteoporosis 11/2010   DEXA -2.7 spine, thoracic compression fracture  . Peripheral vascular disease ([place])   . Smoker    quit 10/2012  . Stroke [facility]) 2010   x3 with residual R hemiparesis, s/p R MCA balloon  angioplasty (2010)    Tobacco Use: Social History   Tobacco Use  Smoking Status Former Smoker  . Packs/day: 2.00  . Years: 46.00  . Pack years: 92.00  . Types: Cigarettes  . Last attempt to quit: 11/08/2012  . Years since quitting: 5.2  Smokeless Tobacco Never Used    Labs: Recent Review Flowsheet Data    Labs for ITP Cardiac and Pulmonary Rehab Latest Ref Rng & Units 12/29/2014 01/29/2015 01/25/2016 06/26/2017 09/11/2017   Cholestrol 0 - 200 mg/dL - 177 167 188 -   LDLCALC 0 - 99 mg/dL - 96 91 107(H) -   LDLDIRECT mg/dL - - - - 84.0   HDL >39.00 mg/dL - 58.70 58.40 65.80 -   Trlycerides 0.0 - 149.0 mg/dL - 110.0 86.0 78.0 -   Hemoglobin A1c 4.6 - 6.1 % - - - - -   TCO2 0 - 100 mmol/L 22 - - - -       Pulmonary Assessment Scores: Pulmonary Assessment Scores    Row Name 12/05/17 1112         ADL UCSD   ADL Phase  Entry     SOB Score total  99     Rest  0     Walk  3     Stairs  5     Bath  5     Dress  5     Shop  5       CAT Score   CAT Score  25       mMRC Score   mMRC Score  2        Pulmonary Function Assessment: Pulmonary Function Assessment - 12/05/17 1116      Initial Spirometry Results   FVC%  64 %    FEV1%  63 %    FEV1/FVC Ratio  73.77    Comments  good patient effort      Post Bronchodilator Spirometry Results   FVC%  62.08 %    FEV1%  66.15 %    FEV1/FVC Ratio  80.48    Comments  good patient effort      Breath   Bilateral Breath Sounds  Clear    Shortness of Breath  Limiting activity;Yes       Exercise Target Goals:    Exercise Program Goal: Individual exercise prescription set using results from initial 6 min walk test and THRR while considering  patient's activity barriers and safety.    Exercise Prescription Goal: Initial exercise prescription builds to 30-45 minutes a day of aerobic activity, 2-3 days per week.  Home exercise guidelines will be given to patient during program as part of exercise prescription that the  participant will acknowledge.  Activity Barriers & Risk Stratification:  6 Minute Walk: 6 Minute Walk    Row Name 12/05/17 1219         6 Minute Walk   Phase  Initial     Distance  765 feet     Walk Time  6 minutes     # of Rest Breaks  0     MPH  1.45     METS  1.97     RPE  13     Perceived Dyspnea   3     Symptoms  Yes (comment)     Comments  pain outside of R leg - pt states due to stroke     Resting HR  95 bpm     Resting BP  120/68     Resting Oxygen Saturation   93 %     Exercise Oxygen Saturation  during 6 min walk  98 %     Max Ex. HR  126 bpm     Max Ex. BP  132/84     2 Minute Post BP  106/70       Oxygen Initial Assessment: Oxygen Initial Assessment - 12/05/17 1114      Home Oxygen   Home Oxygen Device  Home Concentrator;E-Tanks    Sleep Oxygen Prescription  Continuous    Liters per minute  1    Home Exercise Oxygen Prescription  None    Home at Rest Exercise Oxygen Prescription  None    Compliance with Home Oxygen Use  Yes      Initial 6 min Walk   Oxygen Used  None      Program Oxygen Prescription   Program Oxygen Prescription  None      Intervention   Short Term Goals  To learn and exhibit compliance with exercise, home and travel O2 prescription;To learn and understand importance of maintaining oxygen saturations>88%;To learn and demonstrate proper use of respiratory medications;To learn and demonstrate proper pursed lip breathing techniques or other breathing techniques.;To learn and understand importance of monitoring SPO2 with pulse oximeter and demonstrate accurate use of the pulse oximeter.    Long  Term Goals  Exhibits compliance with exercise, home and travel O2 prescription;Verbalizes importance of monitoring SPO2 with pulse oximeter and return demonstration;Maintenance of O2 saturations>88%;Exhibits proper breathing techniques, such as pursed lip breathing or other method taught during program session;Compliance with respiratory  medication;Demonstrates proper use of MDI's       Oxygen Re-Evaluation: Oxygen Re-Evaluation    Row Name 12/13/17 0947             Program Oxygen Prescription   Program Oxygen Prescription  None         Home Oxygen   Home Oxygen Device  Home Concentrator;E-Tanks       Sleep Oxygen Prescription  Continuous       Liters per minute  1       Home Exercise Oxygen Prescription  None       Home at Rest Exercise Oxygen Prescription  None       Compliance with Home Oxygen Use  Yes         Goals/Expected Outcomes   Short Term Goals  To learn and exhibit compliance with exercise, home and travel O2 prescription;To learn and understand importance of maintaining oxygen saturations>88%;To learn and demonstrate proper use of respiratory medications;To learn and demonstrate proper pursed lip breathing techniques or other breathing techniques.;To learn and understand importance of monitoring SPO2 with pulse oximeter and demonstrate accurate use  of the pulse oximeter.       Long  Term Goals  Exhibits compliance with exercise, home and travel O2 prescription;Verbalizes importance of monitoring SPO2 with pulse oximeter and return demonstration;Maintenance of O2 saturations>88%;Exhibits proper breathing techniques, such as pursed lip breathing or other method taught during program session;Compliance with respiratory medication;Demonstrates proper use of MDI's       Comments  Reviewed PLB technique with pt.  Talked about how it work and it's important to maintaining his exercise saturations.         Goals/Expected Outcomes  Short: Become more profiecient at using PLB.   Long: Become independent at using PLB.          Oxygen Discharge (Final Oxygen Re-Evaluation): Oxygen Re-Evaluation - 12/13/17 0947      Program Oxygen Prescription   Program Oxygen Prescription  None      Home Oxygen   Home Oxygen Device  Home Concentrator;E-Tanks    Sleep Oxygen Prescription  Continuous    Liters per minute  1      Home Exercise Oxygen Prescription  None    Home at Rest Exercise Oxygen Prescription  None    Compliance with Home Oxygen Use  Yes      Goals/Expected Outcomes   Short Term Goals  To learn and exhibit compliance with exercise, home and travel O2 prescription;To learn and understand importance of maintaining oxygen saturations>88%;To learn and demonstrate proper use of respiratory medications;To learn and demonstrate proper pursed lip breathing techniques or other breathing techniques.;To learn and understand importance of monitoring SPO2 with pulse oximeter and demonstrate accurate use of the pulse oximeter.    Long  Term Goals  Exhibits compliance with exercise, home and travel O2 prescription;Verbalizes importance of monitoring SPO2 with pulse oximeter and return demonstration;Maintenance of O2 saturations>88%;Exhibits proper breathing techniques, such as pursed lip breathing or other method taught during program session;Compliance with respiratory medication;Demonstrates proper use of MDI's    Comments  Reviewed PLB technique with pt.  Talked about how it work and it's important to maintaining his exercise saturations.      Goals/Expected Outcomes  Short: Become more profiecient at using PLB.   Long: Become independent at using PLB.       Initial Exercise Prescription: Initial Exercise Prescription - 12/05/17 1200      Date of Initial Exercise RX and Referring Provider   Date  12/05/17    Referring Provider  Ashby Dawes      Treadmill   MPH  1    Grade  0    Minutes  15    METs  1.77      NuStep   Level  1    SPM  80    Minutes  15    METs  1.7      Biostep-RELP   Level  1    SPM  50    Minutes  15    METs  1.7      Track   Laps  17    Minutes  15    METs  1.7      Prescription Details   Frequency (times per week)  3    Duration  Progress to 45 minutes of aerobic exercise without signs/symptoms of physical distress      Intensity   THRR 40-80% of Max Heartrate   116-138    Ratings of Perceived Exertion  11-15    Perceived Dyspnea  0-4      Resistance Training  Training Prescription  Yes    Weight  2 lb    Reps  10-15       Perform Capillary Blood Glucose checks as needed.  Exercise Prescription Changes: Exercise Prescription Changes    Row Name 12/20/17 1100 01/03/18 1400           Response to Exercise   Blood Pressure (Admit)  124/74  124/72      Blood Pressure (Exercise)  106/64  -      Blood Pressure (Exit)  126/76  104/64      Heart Rate (Admit)  82 bpm  82 bpm      Heart Rate (Exercise)  122 bpm  101 bpm      Heart Rate (Exit)  96 bpm  100 bpm      Oxygen Saturation (Admit)  96 %  97 %      Oxygen Saturation (Exercise)  95 %  97 %      Oxygen Saturation (Exit)  98 %  96 %      Rating of Perceived Exertion (Exercise)  11  12      Perceived Dyspnea (Exercise)  0  0      Symptoms  none  none      Duration  Continue with 45 min of aerobic exercise without signs/symptoms of physical distress.  Continue with 45 min of aerobic exercise without signs/symptoms of physical distress.      Intensity  THRR unchanged  THRR unchanged        Progression   Progression  Continue to progress workloads to maintain intensity without signs/symptoms of physical distress.  -      Average METs  -  2        Resistance Training   Training Prescription  -  Yes      Weight  -  2 lb      Reps  -  10-15        Interval Training   Interval Training  -  No        Treadmill   MPH  0.8  1      Grade  0.5  0.5      Minutes  15  15      METs  -  1.83        NuStep   Level  -  2      SPM  -  73      Minutes  -  15      METs  -  2.3        Biostep-RELP   Level  2  2      SPM  36  34      Minutes  15  15      METs  1  2         Exercise Comments: Exercise Comments    Row Name 12/13/17 0946           Exercise Comments  First full day of exercise!  Patient was oriented to gym and equipment including functions, settings, policies, and  procedures.  Patient's individual exercise prescription and treatment plan were reviewed.  All starting workloads were established based on the results of the 6 minute walk test done at initial orientation visit.  The plan for exercise progression was also introduced and progression will be customized based on patient's performance and goals.          Exercise Goals and Review: Exercise  Goals    Row Name 12/13/17 1018             Exercise Goals   Increase Physical Activity  Yes       Intervention  Provide advice, education, support and counseling about physical activity/exercise needs.;Develop an individualized exercise prescription for aerobic and resistive training based on initial evaluation findings, risk stratification, comorbidities and participant's personal goals.       Expected Outcomes  Short Term: Attend rehab on a regular basis to increase amount of physical activity.;Long Term: Add in home exercise to make exercise part of routine and to increase amount of physical activity.;Long Term: Exercising regularly at least 3-5 days a week.       Increase Strength and Stamina  Yes       Intervention  Provide advice, education, support and counseling about physical activity/exercise needs.;Develop an individualized exercise prescription for aerobic and resistive training based on initial evaluation findings, risk stratification, comorbidities and participant's personal goals.       Expected Outcomes  Short Term: Increase workloads from initial exercise prescription for resistance, speed, and METs.;Short Term: Perform resistance training exercises routinely during rehab and add in resistance training at home;Long Term: Improve cardiorespiratory fitness, muscular endurance and strength as measured by increased METs and functional capacity (6MWT)       Able to understand and use rate of perceived exertion (RPE) scale  Yes       Intervention  Provide education and explanation on how to use RPE  scale       Expected Outcomes  Short Term: Able to use RPE daily in rehab to express subjective intensity level;Long Term:  Able to use RPE to guide intensity level when exercising independently       Able to understand and use Dyspnea scale  Yes       Intervention  Provide education and explanation on how to use Dyspnea scale       Expected Outcomes  Short Term: Able to use Dyspnea scale daily in rehab to express subjective sense of shortness of breath during exertion;Long Term: Able to use Dyspnea scale to guide intensity level when exercising independently       Knowledge and understanding of Target Heart Rate Range (THRR)  Yes       Intervention  Provide education and explanation of THRR including how the numbers were predicted and where they are located for reference       Expected Outcomes  Short Term: Able to state/look up THRR;Long Term: Able to use THRR to govern intensity when exercising independently;Short Term: Able to use daily as guideline for intensity in rehab       Able to check pulse independently  Yes       Intervention  Provide education and demonstration on how to check pulse in carotid and radial arteries.;Review the importance of being able to check your own pulse for safety during independent exercise       Expected Outcomes  Short Term: Able to explain why pulse checking is important during independent exercise;Long Term: Able to check pulse independently and accurately       Understanding of Exercise Prescription  Yes       Intervention  Provide education, explanation, and written materials on patient's individual exercise prescription       Expected Outcomes  Short Term: Able to explain program exercise prescription;Long Term: Able to explain home exercise prescription to exercise independently  Exercise Goals Re-Evaluation : Exercise Goals Re-Evaluation    Row Name 12/13/17 4580 12/20/17 1202 01/03/18 1437         Exercise Goal Re-Evaluation   Exercise  Goals Review  Understanding of Exercise Prescription;Able to understand and use Dyspnea scale;Knowledge and understanding of Target Heart Rate Range (THRR);Able to understand and use rate of perceived exertion (RPE) scale  Increase Physical Activity;Able to understand and use rate of perceived exertion (RPE) scale;Increase Strength and Stamina;Able to understand and use Dyspnea scale  Increase Physical Activity;Able to understand and use rate of perceived exertion (RPE) scale;Able to understand and use Dyspnea scale;Increase Strength and Stamina     Comments  Reviewed RPE scale, THR and program prescription with pt today.  Pt voiced understanding and was given a copy of goals to take home.   Pt is tolerating exercise well.  Staff will continue to monitor progress.    Lou tolerates exercise well.  Based on HR and RPE, Britny can increase levels on machines.  Staff will monitor progress.      Expected Outcomes  Short: Use RPE daily to regulate intensity.  Long: Follow program prescription in THR.  Short - Pt will attend 3 days per week Long - Pt will complete LW and improve overall MET level  Short - R[place]ana will increase overall MET level Long - Pt will exercise on her own        Discharge Exercise Prescription (Final Exercise Prescription Changes): Exercise Prescription Changes - 01/03/18 1400      Response to Exercise   Blood Pressure (Admit)  124/72    Blood Pressure (Exit)  104/64    Heart Rate (Admit)  82 bpm    Heart Rate (Exercise)  101 bpm    Heart Rate (Exit)  100 bpm    Oxygen Saturation (Admit)  97 %    Oxygen Saturation (Exercise)  97 %    Oxygen Saturation (Exit)  96 %    Rating of Perceived Exertion (Exercise)  12    Perceived Dyspnea (Exercise)  0    Symptoms  none    Duration  Continue with 45 min of aerobic exercise without signs/symptoms of physical distress.    Intensity  THRR unchanged      Progression   Average METs  2      Resistance Training   Training Prescription   Yes    Weight  2 lb    Reps  10-15      Interval Training   Interval Training  No      Treadmill   MPH  1    Grade  0.5    Minutes  15    METs  1.83      NuStep   Level  2    SPM  73    Minutes  15    METs  2.3      Biostep-RELP   Level  2    SPM  34    Minutes  15    METs  2       Nutrition:  Target Goals: Understanding of nutrition guidelines, daily intake of sodium <1511m, cholesterol <2036m calories 30% from fat and 7% or less from saturated fats, daily to have 5 or more servings of fruits and vegetables.  Biometrics: Pre Biometrics - 12/05/17 1217      Pre Biometrics   Height  _0  (1.549 m)    Weight  153 lb 12.8 oz (69.8 kg)  Waist Circumference  35.5 inches    Hip Circumference  46.5 inches    Waist to Hip Ratio  0.76 %    BMI (Calculated)  29.08        Nutrition Therapy Plan and Nutrition Goals: Nutrition Therapy & Goals - 12/05/17 1112      Personal Nutrition Goals   Comments  Lose weight, Learn healthier eating habits.      Intervention Plan   Intervention  Prescribe, educate and counsel regarding individualized specific dietary modifications aiming towards targeted core components such as weight, hypertension, lipid management, diabetes, heart failure and other comorbidities.;Nutrition handout(s) given to patient.    Expected Outcomes  Short Term Goal: Understand basic principles of dietary content, such as calories, fat, sodium, cholesterol and nutrients.;Long Term Goal: Adherence to prescribed nutrition plan.       Nutrition Assessments:   Nutrition Goals Re-Evaluation:   Nutrition Goals Discharge (Final Nutrition Goals Re-Evaluation):   Psychosocial: Target Goals: Acknowledge presence or absence of significant depression and/or stress, maximize coping skills, provide positive support system. Participant is able to verbalize types and ability to use techniques and skills needed for reducing stress and depression.   Initial Review  & Psychosocial Screening: Initial Psych Review & Screening - 12/05/17 1110      Initial Review   Current issues with  Current Depression;History of Depression;Current Psychotropic Meds;Current Sleep Concerns;Current Stress Concerns    Source of Stress Concerns  Chronic Illness;Unable to perform yard/household activities;Unable to participate in former interests or hobbies    Comments  She takes Zoloft to help her depression.      Family Dynamics   Good Support System?  Yes    Comments  Looks to her daughters and grandaughters for support      Barriers   Psychosocial barriers to participate in program  The patient should benefit from training in stress management and relaxation.      Screening Interventions   Interventions  Encouraged to exercise;Provide feedback about the scores to participant;Program counselor consult;To provide support and resources with identified psychosocial needs    Expected Outcomes  Short Term goal: Utilizing psychosocial counselor, staff and physician to assist with identification of specific Stressors or current issues interfering with healing process. Setting desired goal for each stressor or current issue identified.;Long Term Goal: Stressors or current issues are controlled or eliminated.;Short Term goal: Identification and review with participant of any Quality of Life or Depression concerns found by scoring the questionnaire.;Long Term goal: The participant improves quality of Life and PHQ9 Scores as seen by post scores and/or verbalization of changes       Quality of Life Scores:  Scores of 19 and below usually indicate a poorer quality of life in these areas.  A difference of  2-3 points is a clinically meaningful difference.  A difference of 2-3 points in the total score of the Quality of Life Index has been associated with significant improvement in overall quality of life, self-image, physical symptoms, and general health in studies assessing change in  quality of life.  PHQ-9: Recent Review Flowsheet Data    Depression screen [facility] 2/9 12/13/2017 12/05/2017 06/26/2017 02/01/2016 01/29/2015   Decreased Interest 1 1 0 0 0   Down, Depressed, Hopeless 0 2 1 0 3   PHQ - 2 Score _0 0 3   Altered sleeping 0 2 1 - 3    Tired, decreased energy 2 3 0 - 3   Change in appetite 2 2  0 - 0    Feeling bad or failure about yourself  1 2 0 - 3    Trouble concentrating 1 1 0 - 1   Moving slowly or fidgety/restless 2 2 0 - 3    Suicidal thoughts 0 0 0 - 0   PHQ-9 Score _0 - 16   Difficult doing work/chores Somewhat difficult Somewhat difficult Not difficult at all - Somewhat difficult     Interpretation of Total Score  Total Score Depression Severity:  1-4 = Minimal depression, 5-9 = Mild depression, 10-14 = Moderate depression, 15-19 = Moderately severe depression, 20-27 = Severe depression   Psychosocial Evaluation and Intervention: Psychosocial Evaluation - 12/13/17 1115      Psychosocial Evaluation & Interventions   Interventions  Encouraged to exercise with the program and follow exercise prescription    Comments  Counselor met today with Ms. Riviello [facility]) for initial psychosocial evaluation.  She is a 72 year old who was recently diagnosed with COPD.  Denina has a strong support system with two daughters and a niece who live close by and she is involved in her local church.  Karrisa describes herself as being in "good health" overall other than a recent back surgery and now COPD. She sleeps "better" lately and has a "fair" appetite.  She reports a history of depression over the past several years and has been on medications to help with her mood - which she states is generally positive lately.   Counselor mentioned her PHQ-9 scores of "15" indicating moderately severe symptoms of depression; and reviewed some of the questions with her reporting sleeping better and no longer depressed or hopeless.  She reported feeling better about herself and has  more energy lately.  As a result her scores dropped to a "9" indicating some mild depressive symptoms are present.  Her main stress is primarily her health currently.   Milayah would like to breathe and walk better while in this program.  She will be followed by staff throughout the course of this program.      Expected Outcomes  Leaha will benefit from consistent exercise to achieve her stated goals.  She will continue to take her medications and exercise for her mental health as well.  The educational and psychoeducational components of this program will be helpful in [place] understanding and coping more positively with her condition.      Continue Psychosocial Services   Follow up required by staff       Psychosocial Re-Evaluation:   Psychosocial Discharge (Final Psychosocial Re-Evaluation):   Education: Education Goals: Education classes will be provided on a weekly basis, covering required topics. Participant will state understanding/return demonstration of topics presented.  Learning Barriers/Preferences: Learning Barriers/Preferences - 12/05/17 1114      Learning Barriers/Preferences   Learning Barriers  Sight wears reading glasses    Learning Preferences  None       Education Topics:  Initial Evaluation Education: - Verbal, written and demonstration of respiratory meds, oximetry and breathing techniques. Instruction on use of nebulizers and MDIs and importance of monitoring MDI activations.   Pulmonary Rehab from 12/25/2017 in [facility] Cardiac and Pulmonary Rehab  Date  12/05/17  Educator  [facility]  Instruction Review Code  1- Verbalizes Understanding      General Nutrition Guidelines/Fats and Fiber: -Group instruction provided by verbal, written material, models and posters to present the general guidelines for heart healthy nutrition. Gives an explanation and review of dietary fats and fiber.  Controlling Sodium/Reading Food Labels: -Group verbal and written material supporting  the discussion of sodium use in heart healthy nutrition. Review and explanation with models, verbal and written materials for utilization of the food label.   Pulmonary Rehab from 12/25/2017 in [facility] Cardiac and Pulmonary Rehab  Date  12/25/17  Educator  [facility]  Instruction Review Code  1- Verbalizes Understanding      Exercise Physiology & General Exercise Guidelines: - Group verbal and written instruction with models to review the exercise physiology of the cardiovascular system and associated critical values. Provides general exercise guidelines with specific guidelines to those with heart or lung disease.    Aerobic Exercise & Resistance Training: - Gives group verbal and written instruction on the various components of exercise. Focuses on aerobic and resistive training programs and the benefits of this training and how to safely progress through these programs.   Flexibility, Balance, Mind/Body Relaxation: Provides group verbal/written instruction on the benefits of flexibility and balance training, including mind/body exercise modes such as yoga, pilates and tai chi.  Demonstration and skill practice provided.   Stress and Anxiety: - Provides group verbal and written instruction about the health risks of elevated stress and causes of high stress.  Discuss the correlation between heart/lung disease and anxiety and treatment options. Review healthy ways to manage with stress and anxiety.   Depression: - Provides group verbal and written instruction on the correlation between heart/lung disease and depressed mood, treatment options, and the stigmas associated with seeking treatment.   Exercise & Equipment Safety: - Individual verbal instruction and demonstration of equipment use and safety with use of the equipment.   Pulmonary Rehab from 12/25/2017 in [facility] Cardiac and Pulmonary Rehab  Date  12/05/17  Educator  [facility]  Instruction Review Code  1- Verbalizes Understanding      Infection  Prevention: - Provides verbal and written material to individual with discussion of infection control including proper hand washing and proper equipment cleaning during exercise session.   Pulmonary Rehab from 12/25/2017 in [facility] Cardiac and Pulmonary Rehab  Date  12/05/17  Educator  [facility]  Instruction Review Code  1- Verbalizes Understanding      Falls Prevention: - Provides verbal and written material to individual with discussion of falls prevention and safety.   Pulmonary Rehab from 12/25/2017 in [facility] Cardiac and Pulmonary Rehab  Date  12/05/17  Educator  [facility]  Instruction Review Code  1- Verbalizes Understanding      Diabetes: - Individual verbal and written instruction to review signs/symptoms of diabetes, desired ranges of glucose level fasting, after meals and with exercise. Advice that pre and post exercise glucose checks will be done for 3 sessions at entry of program.   Chronic Lung Diseases: - Group verbal and written instruction to review updates, respiratory medications, advancements in procedures and treatments. Discuss use of supplemental oxygen including available portable oxygen systems, continuous and intermittent flow rates, concentrators, personal use and safety guidelines. Review proper use of inhaler and spacers. Provide informative websites for self-education.    Pulmonary Rehab from 12/25/2017 in [facility] Cardiac and Pulmonary Rehab  Date  12/20/17  Educator  [facility]  Instruction Review Code  1- Verbalizes Understanding      Energy Conservation: - Provide group verbal and written instruction for methods to conserve energy, plan and organize activities. Instruct on pacing techniques, use of adaptive equipment and posture/positioning to relieve shortness of breath.   Triggers and Exacerbations: - Group verbal and written instruction to review types  of environmental triggers and ways to prevent exacerbations. Discuss weather changes, air quality and the benefits of nasal washing.  Review warning signs and symptoms to help prevent infections. Discuss techniques for effective airway clearance, coughing, and vibrations.   AED/CPR: - Group verbal and written instruction with the use of models to demonstrate the basic use of the AED with the basic ABC's of resuscitation.   Anatomy and Physiology of the Lungs: - Group verbal and written instruction with the use of models to provide basic lung anatomy and physiology related to function, structure and complications of lung disease.   Anatomy & Physiology of the Heart: - Group verbal and written instruction and models provide basic cardiac anatomy and physiology, with the coronary electrical and arterial systems. Review of Valvular disease and Heart Failure   Cardiac Medications: - Group verbal and written instruction to review commonly prescribed medications for heart disease. Reviews the medication, class of the drug, and side effects.   Know Your Numbers and Risk Factors: -Group verbal and written instruction about important numbers in your health.  Discussion of what are risk factors and how they play a role in the disease process.  Review of Cholesterol, Blood Pressure, Diabetes, and BMI and the role they play in your overall health.   Sleep Hygiene: -Provides group verbal and written instruction about how sleep can affect your health.  Define sleep hygiene, discuss sleep cycles and impact of sleep habits. Review good sleep hygiene tips.    Other: -Provides group and verbal instruction on various topics (see comments)    Knowledge Questionnaire Score: Knowledge Questionnaire Score - 12/05/17 1114      Knowledge Questionnaire Score   Pre Score  15/18 Reviewed with patient        Core Components/Risk Factors/Patient Goals at Admission: Personal Goals and Risk Factors at Admission - 12/05/17 1115      Core Components/Risk Factors/Patient Goals on Admission    Weight Management  Yes;Weight  Maintenance;Weight Loss    Intervention  Weight Management: Develop a combined nutrition and exercise program designed to reach desired caloric intake, while maintaining appropriate intake of nutrient and fiber, sodium and fats, and appropriate energy expenditure required for the weight goal.;Weight Management: Provide education and appropriate resources to help participant work on and attain dietary goals.;Weight Management/Obesity: Establish reasonable short term and long term weight goals.    Admit Weight  153 lb 12.8 oz (69.8 kg)    Goal Weight: Short Term  148 lb (67.1 kg)    Goal Weight: Long Term  130 lb (59 kg)    Expected Outcomes  Short Term: Continue to assess and modify interventions until short term weight is achieved;Long Term: Adherence to nutrition and physical activity/exercise program aimed toward attainment of established weight goal;Weight Maintenance: Understanding of the daily nutrition guidelines, which includes 25-35% calories from fat, 7% or less cal from saturated fats, less than 275m cholesterol, less than 1.5gm of sodium, & 5 or more servings of fruits and vegetables daily;Weight Loss: Understanding of general recommendations for a balanced deficit meal plan, which promotes 1-2 lb weight loss per week and includes a negative energy balance of [phone] kcal/d;Understanding recommendations for meals to include 15-35% energy as protein, 25-35% energy from fat, 35-60% energy from carbohydrates, less than 20102mof dietary cholesterol, 20-35 gm of total fiber daily;Understanding of distribution of calorie intake throughout the day with the consumption of 4-5 meals/snacks    Improve shortness of breath with ADL's  Yes  Intervention  Provide education, individualized exercise plan and daily activity instruction to help decrease symptoms of SOB with activities of daily living.    Expected Outcomes  Short Term: Improve cardiorespiratory fitness to achieve a reduction of symptoms when  performing ADLs;Long Term: Be able to perform more ADLs without symptoms or delay the onset of symptoms    Heart Failure  Yes    Intervention  Provide a combined exercise and nutrition program that is supplemented with education, support and counseling about heart failure. Directed toward relieving symptoms such as shortness of breath, decreased exercise tolerance, and extremity edema.    Expected Outcomes  Improve functional capacity of life;Short term: Attendance in program 2-3 days a week with increased exercise capacity. Reported lower sodium intake. Reported increased fruit and vegetable intake. Reports medication compliance.;Short term: Daily weights obtained and reported for increase. Utilizing diuretic protocols set by physician.;Long term: Adoption of self-care skills and reduction of barriers for early signs and symptoms recognition and intervention leading to self-care maintenance.    Hypertension  Yes    Intervention  Provide education on lifestyle modifcations including regular physical activity/exercise, weight management, moderate sodium restriction and increased consumption of fresh fruit, vegetables, and low fat dairy, alcohol moderation, and smoking cessation.;Monitor prescription use compliance.    Expected Outcomes  Short Term: Continued assessment and intervention until BP is < 140/5m HG in hypertensive participants. < 130/841mHG in hypertensive participants with diabetes, heart failure or chronic kidney disease.;Long Term: Maintenance of blood pressure at goal levels.       Core Components/Risk Factors/Patient Goals Review:    Core Components/Risk Factors/Patient Goals at Discharge (Final Review):    ITP Comments: ITP Comments    Row Name 12/05/17 1045 01/01/18 0828 01/15/18 0820 01/29/18 0824 02/08/18 1347   ITP Comments  Medical Evaluation completed. Chart sent for review and changes to Dr. MaEmily Filbertirector of [place]osis can be found in [facility] encounter  11/21/17   30 day review completed. ITP sent to Dr. MaEmily Filbertirector of [place] unless changes are made by physician  Called patient to see if [place]. Unable to leave message. Patient has not attended since 12/25/17.  patient has not returned to class since 01/01/18.  30 day review completed. ITP sent to Dr. MaEmily Filbertirector of Lu[place]omeContinue with [place] unless changes are made by physician  Shelene called to let usKoreanow that she would like to discharge from the program.  She has a lot of other medical issues going on and does not feel that she can do rehab at this time. Discharge ITP sent and signed by Dr. MiSabra Heck Discharge Summary routed to PCP and pulmonologist.      Comments: Discharge ITP

## 2018-02-08 NOTE — Telephone Encounter (Signed)
[name] [name] called to let us know that she would like to discharge from the program.  She has a lot of other medical issues going on and does not feel that she can do rehab at this time.

## 2018-02-08 NOTE — Progress Notes (Signed)
Discharge Progress Report  Patient Details  Name: [name] [name] MRN: [id number] Date of Birth: 1946/07/10 Referring Provider:     Pulmonary Rehab from 12/05/2017 in [facility]  Referring Provider  Ramachandran       Number of Visits: 8/36  Reason for Discharge:  Early Exit:  Personal  Smoking History:  Social History   Tobacco Use  Smoking Status Former Smoker  . Packs/day: 2.00  . Years: 46.00  . Pack years: 92.00  . Types: Cigarettes  . Last attempt to quit: 11/08/2012  . Years since quitting: 5.2  Smokeless Tobacco Never Used    Diagnosis:  Chronic obstructive pulmonary disease, unspecified COPD type (HCC)  ADL UCSD: Pulmonary Assessment Scores    Row Name 12/05/17 1112         ADL UCSD   ADL Phase  Entry     SOB Score total  99     Rest  0     Walk  3     Stairs  5     Bath  5     Dress  5     Shop  5       CAT Score   CAT Score  25       mMRC Score   mMRC Score  2        Initial Exercise Prescription: Initial Exercise Prescription - 12/05/17 1200      Date of Initial Exercise RX and Referring Provider   Date  12/05/17    Referring Provider  Nicholos Johns      Treadmill   MPH  1    Grade  0    Minutes  15    METs  1.77      NuStep   Level  1    SPM  80    Minutes  15    METs  1.7      Biostep-RELP   Level  1    SPM  50    Minutes  15    METs  1.7      Track   Laps  17    Minutes  15    METs  1.7      Prescription Details   Frequency (times per week)  3    Duration  Progress to 45 minutes of aerobic exercise without signs/symptoms of physical distress      Intensity   THRR 40-80% of Max Heartrate  116-138    Ratings of Perceived Exertion  11-15    Perceived Dyspnea  0-4      Resistance Training   Training Prescription  Yes    Weight  2 lb    Reps  10-15       Discharge Exercise Prescription (Final Exercise Prescription Changes): Exercise Prescription Changes - 01/03/18 1400       Response to Exercise   Blood Pressure (Admit)  124/72    Blood Pressure (Exit)  104/64    Heart Rate (Admit)  82 bpm    Heart Rate (Exercise)  101 bpm    Heart Rate (Exit)  100 bpm    Oxygen Saturation (Admit)  97 %    Oxygen Saturation (Exercise)  97 %    Oxygen Saturation (Exit)  96 %    Rating of Perceived Exertion (Exercise)  12    Perceived Dyspnea (Exercise)  0    Symptoms  none    Duration  Continue with 45 min of aerobic  exercise without signs/symptoms of physical distress.    Intensity  THRR unchanged      Progression   Average METs  2      Resistance Training   Training Prescription  Yes    Weight  2 lb    Reps  10-15      Interval Training   Interval Training  No      Treadmill   MPH  1    Grade  0.5    Minutes  15    METs  1.83      NuStep   Level  2    SPM  73    Minutes  15    METs  2.3      Biostep-RELP   Level  2    SPM  34    Minutes  15    METs  2       Functional Capacity: 6 Minute Walk    Row Name 12/05/17 1219         6 Minute Walk   Phase  Initial     Distance  765 feet     Walk Time  6 minutes     # of Rest Breaks  0     MPH  1.45     METS  1.97     RPE  13     Perceived Dyspnea   3     Symptoms  Yes (comment)     Comments  pain outside of R leg - pt states due to stroke     Resting HR  95 bpm     Resting BP  120/68     Resting Oxygen Saturation   93 %     Exercise Oxygen Saturation  during 6 min walk  98 %     Max Ex. HR  126 bpm     Max Ex. BP  132/84     2 Minute Post BP  106/70        Psychological, QOL, Others - Outcomes: PHQ 2/9: Depression screen [facility] 2/9 12/13/2017 12/05/2017 06/26/2017 02/01/2016 01/29/2015  Decreased Interest 1 1 0 0 0  Down, Depressed, Hopeless 0 2 1 0 3  PHQ - 2 Score 0 3  Altered sleeping 0 2 1 - 3  Tired, decreased energy 2 3 0 - 3  Change in appetite 2 2 0 - 0  Feeling bad or failure about yourself  1 2 0 - 3  Trouble concentrating 1 1 0 - 1  Moving slowly or fidgety/restless 2 2 0  - 3  Suicidal thoughts 0 0 0 - 0  PHQ-9 Score - 16  Difficult doing work/chores Somewhat difficult Somewhat difficult Not difficult at all - Somewhat difficult    Quality of Life:   Personal Goals: Goals established at orientation with interventions provided to work toward goal. Personal Goals and Risk Factors at Admission - 12/05/17 1115      Core Components/Risk Factors/Patient Goals on Admission    Weight Management  Yes;Weight Maintenance;Weight Loss    Intervention  Weight Management: Develop a combined nutrition and exercise program designed to reach desired caloric intake, while maintaining appropriate intake of nutrient and fiber, sodium and fats, and appropriate energy expenditure required for the weight goal.;Weight Management: Provide education and appropriate resources to help participant work on and attain dietary goals.;Weight Management/Obesity: Establish reasonable short term and long term weight goals.    Admit Weight  153 lb 12.8 oz (69.8 kg)    Goal Weight: Short Term  148 lb (67.1 kg)    Goal Weight: Long Term  130 lb (59 kg)    Expected Outcomes  Short Term: Continue to assess and modify interventions until short term weight is achieved;Long Term: Adherence to nutrition and physical activity/exercise program aimed toward attainment of established weight goal;Weight Maintenance: Understanding of the daily nutrition guidelines, which includes 25-35% calories from fat, 7% or less cal from saturated fats, less than  cholesterol, less than 1.5gm of sodium, & 5 or more servings of fruits and vegetables daily;Weight Loss: Understanding of general recommendations for a balanced deficit meal plan, which promotes 1-2 lb weight loss per week and includes a negative energy balance of [phone] kcal/d;Understanding recommendations for meals to include 15-35% energy as protein, 25-35% energy from fat, 35-60% energy from carbohydrates, less than  of dietary cholesterol, 20-35  gm of total fiber daily;Understanding of distribution of calorie intake throughout the day with the consumption of 4-5 meals/snacks    Improve shortness of breath with ADL's  Yes    Intervention  Provide education, individualized exercise plan and daily activity instruction to help decrease symptoms of SOB with activities of daily living.    Expected Outcomes  Short Term: Improve cardiorespiratory fitness to achieve a reduction of symptoms when performing ADLs;Long Term: Be able to perform more ADLs without symptoms or delay the onset of symptoms    Heart Failure  Yes    Intervention  Provide a combined exercise and nutrition program that is supplemented with education, support and counseling about heart failure. Directed toward relieving symptoms such as shortness of breath, decreased exercise tolerance, and extremity edema.    Expected Outcomes  Improve functional capacity of life;Short term: Attendance in program 2-3 days a week with increased exercise capacity. Reported lower sodium intake. Reported increased fruit and vegetable intake. Reports medication compliance.;Short term: Daily weights obtained and reported for increase. Utilizing diuretic protocols set by physician.;Long term: Adoption of self-care skills and reduction of barriers for early signs and symptoms recognition and intervention leading to self-care maintenance.    Hypertension  Yes    Intervention  Provide education on lifestyle modifcations including regular physical activity/exercise, weight management, moderate sodium restriction and increased consumption of fresh fruit, vegetables, and low fat dairy, alcohol moderation, and smoking cessation.;Monitor prescription use compliance.    Expected Outcomes  Short Term: Continued assessment and intervention until BP is < 140/68mm HG in hypertensive participants. < 130/59mm HG in hypertensive participants with diabetes, heart failure or chronic kidney disease.;Long Term: Maintenance of  blood pressure at goal levels.        Personal Goals Discharge:   Exercise Goals and Review: Exercise Goals    Row Name 12/13/17 1018             Exercise Goals   Increase Physical Activity  Yes       Intervention  Provide advice, education, support and counseling about physical activity/exercise needs.;Develop an individualized exercise prescription for aerobic and resistive training based on initial evaluation findings, risk stratification, comorbidities and participant's personal goals.       Expected Outcomes  Short Term: Attend rehab on a regular basis to increase amount of physical activity.;Long Term: Add in home exercise to make exercise part of routine and to increase amount of physical activity.;Long Term: Exercising regularly at least 3-5 days a week.       Increase Strength and Stamina  Yes  Intervention  Provide advice, education, support and counseling about physical activity/exercise needs.;Develop an individualized exercise prescription for aerobic and resistive training based on initial evaluation findings, risk stratification, comorbidities and participant's personal goals.       Expected Outcomes  Short Term: Increase workloads from initial exercise prescription for resistance, speed, and METs.;Short Term: Perform resistance training exercises routinely during rehab and add in resistance training at home;Long Term: Improve cardiorespiratory fitness, muscular endurance and strength as measured by increased METs and functional capacity ( )       Able to understand and use rate of perceived exertion (RPE) scale  Yes       Intervention  Provide education and explanation on how to use RPE scale       Expected Outcomes  Short Term: Able to use RPE daily in rehab to express subjective intensity level;Long Term:  Able to use RPE to guide intensity level when exercising independently       Able to understand and use Dyspnea scale  Yes       Intervention  Provide education  and explanation on how to use Dyspnea scale       Expected Outcomes  Short Term: Able to use Dyspnea scale daily in rehab to express subjective sense of shortness of breath during exertion;Long Term: Able to use Dyspnea scale to guide intensity level when exercising independently       Knowledge and understanding of Target Heart Rate Range (THRR)  Yes       Intervention  Provide education and explanation of THRR including how the numbers were predicted and where they are located for reference       Expected Outcomes  Short Term: Able to state/look up THRR;Long Term: Able to use THRR to govern intensity when exercising independently;Short Term: Able to use daily as guideline for intensity in rehab       Able to check pulse independently  Yes       Intervention  Provide education and demonstration on how to check pulse in carotid and radial arteries.;Review the importance of being able to check your own pulse for safety during independent exercise       Expected Outcomes  Short Term: Able to explain why pulse checking is important during independent exercise;Long Term: Able to check pulse independently and accurately       Understanding of Exercise Prescription  Yes       Intervention  Provide education, explanation, and written materials on patient's individual exercise prescription       Expected Outcomes  Short Term: Able to explain program exercise prescription;Long Term: Able to explain home exercise prescription to exercise independently          Nutrition & Weight - Outcomes: Pre Biometrics - 12/05/17 1217      Pre Biometrics   Height   (1.549 m)    Weight  153 lb 12.8 oz (69.8 kg)    Waist Circumference  35.5 inches    Hip Circumference  46.5 inches    Waist to Hip Ratio  0.76 %    BMI (Calculated)  29.08        Nutrition: Nutrition Therapy & Goals - 12/05/17 1112      Personal Nutrition Goals   Comments  Lose weight, Learn healthier eating habits.      Intervention Plan    Intervention  Prescribe, educate and counsel regarding individualized specific dietary modifications aiming towards targeted core components such as weight, hypertension, lipid management, diabetes,  heart failure and other comorbidities.;Nutrition handout(s) given to patient.    Expected Outcomes  Short Term Goal: Understand basic principles of dietary content, such as calories, fat, sodium, cholesterol and nutrients.;Long Term Goal: Adherence to prescribed nutrition plan.       Nutrition Discharge:   Education Questionnaire Score: Knowledge Questionnaire Score - 12/05/17 1114      Knowledge Questionnaire Score   Pre Score  15/18 Reviewed with patient       Goals reviewed with patient; copy given to patient.

## 2018-02-14 DIAGNOSIS — J449 Chronic obstructive pulmonary disease, unspecified: Secondary | ICD-10-CM | POA: Diagnosis not present

## 2018-02-14 DIAGNOSIS — R269 Unspecified abnormalities of gait and mobility: Secondary | ICD-10-CM | POA: Diagnosis not present

## 2018-03-08 IMAGING — DX DG CHEST 2V
2 series · 2 of 2 positions shown · non-contrast
Comparison: Chest CT 07/19/2017, lumbar spine radiographs from
10/30/2017

CLINICAL DATA: Intermittent dyspnea left-sided neck pain.

EXAM:
CHEST  2 VIEW

[chest pa]
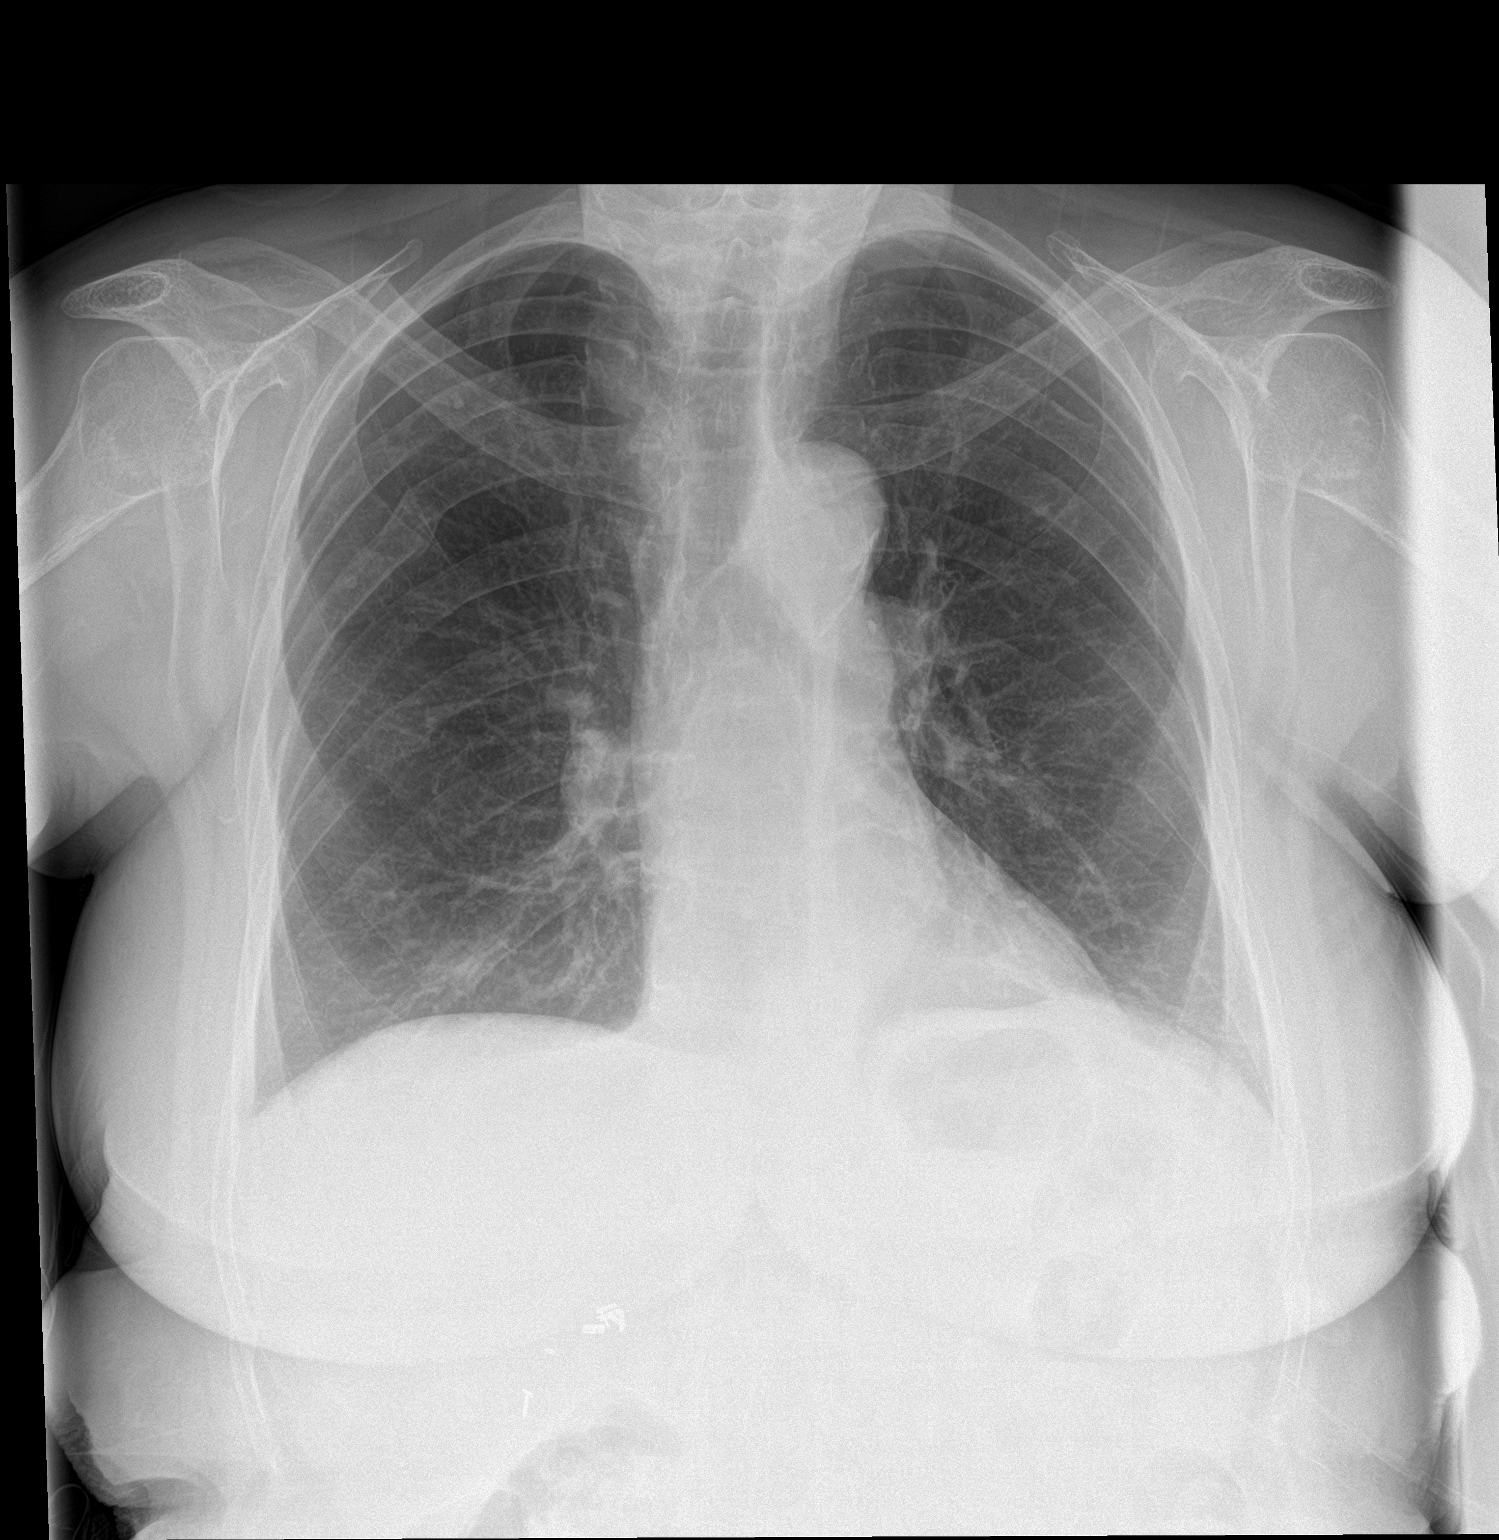

[chest lat]
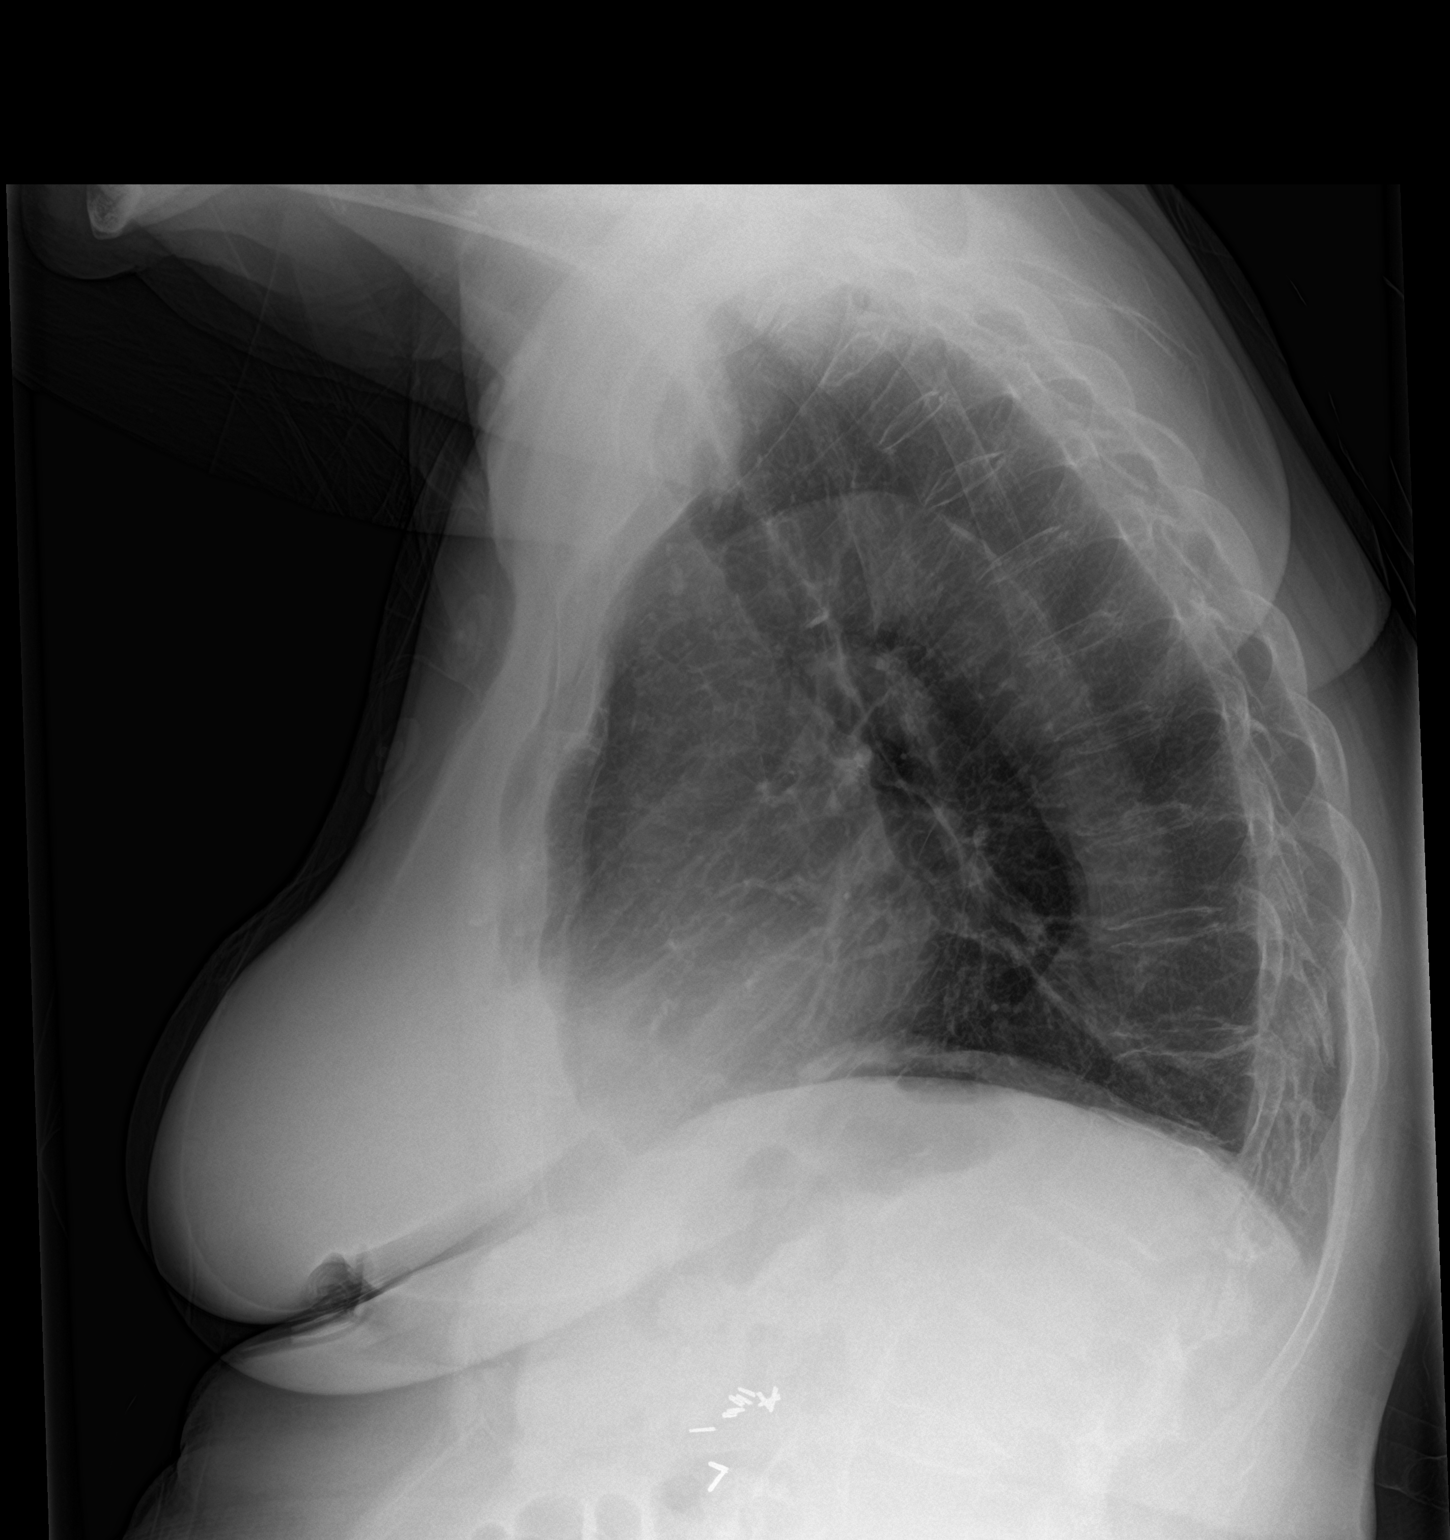

[2 of 2 positions shown; findings below may reference images not displayed]

FINDINGS: The heart size and mediastinal contours are within normal limits.
Both lungs are clear. Bibasilar subsegmental atelectasis. No
effusion or pneumothorax. Chronic right posterior rib fracture. No
acute osseous abnormality. Chronic stable anterior compression of
T11
IMPRESSION: No active cardiopulmonary disease. Chronic stable moderate
compression of T11 without retropulsion.

## 2018-03-17 DIAGNOSIS — J449 Chronic obstructive pulmonary disease, unspecified: Secondary | ICD-10-CM | POA: Diagnosis not present

## 2018-03-17 DIAGNOSIS — R269 Unspecified abnormalities of gait and mobility: Secondary | ICD-10-CM | POA: Diagnosis not present

## 2018-04-16 DIAGNOSIS — J449 Chronic obstructive pulmonary disease, unspecified: Secondary | ICD-10-CM | POA: Diagnosis not present

## 2018-04-16 DIAGNOSIS — R269 Unspecified abnormalities of gait and mobility: Secondary | ICD-10-CM | POA: Diagnosis not present

## 2018-04-23 ENCOUNTER — Other Ambulatory Visit: Payer: Self-pay | Admitting: Family Medicine

## 2018-05-07 ENCOUNTER — Ambulatory Visit (INDEPENDENT_AMBULATORY_CARE_PROVIDER_SITE_OTHER): Payer: Medicare HMO | Admitting: Family Medicine

## 2018-05-07 ENCOUNTER — Encounter: Payer: Self-pay | Admitting: Family Medicine

## 2018-05-07 VITALS — BP 114/60 | HR 107 | Temp 97.8°F | Ht 60.5 in | Wt 153.0 lb

## 2018-05-07 DIAGNOSIS — R531 Weakness: Secondary | ICD-10-CM

## 2018-05-07 DIAGNOSIS — B372 Candidiasis of skin and nail: Secondary | ICD-10-CM | POA: Diagnosis not present

## 2018-05-07 DIAGNOSIS — E039 Hypothyroidism, unspecified: Secondary | ICD-10-CM | POA: Diagnosis not present

## 2018-05-07 DIAGNOSIS — R5381 Other malaise: Secondary | ICD-10-CM | POA: Insufficient documentation

## 2018-05-07 LAB — BASIC METABOLIC PANEL
BUN: 26 mg/dL — ABNORMAL HIGH (ref 6–23)
CO2: 30 mEq/L (ref 19–32)
Calcium: 9.9 mg/dL (ref 8.4–10.5)
Chloride: 101 mEq/L (ref 96–112)
Creatinine, Ser: 1.06 mg/dL (ref 0.40–1.20)
GFR: 54.22 mL/min — ABNORMAL LOW (ref >60.00)
Glucose, Bld: 97 mg/dL (ref 70–99)
Potassium: 4.8 mEq/L (ref 3.5–5.1)
Sodium: 136 mEq/L (ref 135–145)

## 2018-05-07 LAB — CBC WITH DIFFERENTIAL/PLATELET
Basophils Absolute: 0.1 10*3/uL (ref 0.0–0.1)
Basophils Relative: 1.8 % (ref 0.0–3.0)
Eosinophils Absolute: 0.1 10*3/uL (ref 0.0–0.7)
Eosinophils Relative: 2.2 % (ref 0.0–5.0)
HCT: 38.2 % (ref 36.0–46.0)
Hemoglobin: 12.8 g/dL (ref 12.0–15.0)
Lymphocytes Relative: 27.9 % (ref 12.0–46.0)
Lymphs Abs: 1.6 10*3/uL (ref 0.7–4.0)
MCHC: 33.4 g/dL (ref 30.0–36.0)
MCV: 90.4 fl (ref 78.0–100.0)
Monocytes Absolute: 0.5 10*3/uL (ref 0.1–1.0)
Monocytes Relative: 9.1 % (ref 3.0–12.0)
Neutro Abs: 3.5 10*3/uL (ref 1.4–7.7)
Neutrophils Relative %: 59 % (ref 43.0–77.0)
Platelets: 171 10*3/uL (ref 150.0–400.0)
RBC: 4.23 Mil/uL (ref 3.87–5.11)
RDW: 13.5 % (ref 11.5–15.5)
WBC: 5.9 10*3/uL (ref 4.0–10.5)

## 2018-05-07 LAB — T4, FREE: Free T4: 0.8 ng/dL (ref 0.60–1.60)

## 2018-05-07 LAB — TSH: TSH: 5.06 u[IU]/mL — ABNORMAL HIGH (ref 0.35–4.50)

## 2018-05-07 MED ORDER — FUROSEMIDE 20 MG PO TABS: 20.0000 mg | ORAL_TABLET | ORAL | 1 refills | Status: DC | PRN

## 2018-05-07 MED ORDER — TRAMADOL HCL 50 MG PO TABS: 50.0000 mg | ORAL_TABLET | Freq: Three times a day (TID) | ORAL | 0 refills | Status: DC | PRN

## 2018-05-07 MED ORDER — FLUCONAZOLE 150 MG PO TABS: 150.0000 mg | ORAL_TABLET | ORAL | 0 refills | Status: DC

## 2018-05-07 MED ORDER — NYSTATIN 100000 UNIT/GM EX POWD: Freq: Three times a day (TID) | CUTANEOUS | 0 refills | Status: DC

## 2018-05-07 NOTE — Assessment & Plan Note (Signed)
Update TFTs, reports compliance with daily levothyroxine.

## 2018-05-07 NOTE — Progress Notes (Signed)
BP 114/60 (BP Location: Left Arm, Patient Position: Sitting, Cuff Size: Normal)   Pulse (!) 107   Temp 97.8 F (36.6 C) (Oral)   Ht 5' 0.5" (1.537 m)   Wt 153 lb (69.4 kg)   SpO2 95%   BMI 29.39 kg/m    CC: rash below breasts Subjective:    Patient ID: [name] [name], female    DOB: 1946/08/28, 72 y.o.   MRN: [id number]  HPI: [name] [name] is a 72 y.o. female presenting on 05/07/2018 for Rash (C/o rash under bilateral breasts. Was seen and treated on 01/30/18 with nystatin cream. States it is not helping. Pt accompained by daughter, [name] [name]. ) and Fatigue (States she has been feeling week for awhile causing her to stumble while walking. )   Saw Dr Ermalene Searing 3 months ago with dx candidal intertrigo, treated with nystatin cream BID - never really helped. She has been using baby powder. Describes rash as raw pain. Staying about the same, but may be spreading. Also seen for UTI treated with kefelx 500mg  tid x 7 days.   Also for the last week not feeling well "don't feel right". Feels weak or dizzy, worse with walking/standing, better when she sits down. She thinks she's staying well hydrated. Cold intolerance.   Denies fevers/chills, headaches, chest pain, dyspnea, abd pain, new back pain, diarrhea, vomiting/nausea.   Reports compliance with levothyroxine daily.  She takes lasix 20mg  PRN weight gain or leg swelling - averages about 1x/wk. Effective diuresis.   Relevant past medical, surgical, family and social history reviewed and updated as indicated. Interim medical history since our last visit reviewed. Allergies and medications reviewed and updated. Outpatient Medications Prior to Visit  Medication Sig Dispense Refill  . acetaminophen (TYLENOL) 500 MG tablet Take 500 mg by mouth 2 (two) times daily as needed for mild pain.     [name] albuterol (PROVENTIL HFA;VENTOLIN HFA) 108 (90 Base) MCG/ACT inhaler Inhale 2 puffs into the lungs every 6 (six) hours as needed for wheezing or  shortness of breath. 1 Inhaler 2  . alendronate (FOSAMAX) 70 MG tablet TAKE 1 TABLET WEEKLY  WITH A FULL GLASS OF WATER ON AN EMPTY STOMACH 12 tablet 3  . aspirin EC 81 MG tablet Take 81 mg by mouth daily.    [name] atorvastatin (LIPITOR) 40 MG tablet TAKE 1 TABLET (40 MG TOTAL) BY MOUTH DAILY. 90 tablet 2  . benazepril (LOTENSIN) 5 MG tablet Take 1 tablet (5 mg total) by mouth daily. 30 tablet 3  . Calcium Carb-Cholecalciferol (CALCIUM-VITAMIN D) 600-400 MG-UNIT TABS Take 1 tablet by mouth daily.    . Cholecalciferol (VITAMIN D PO) Take 1 capsule by mouth daily.    . clopidogrel (PLAVIX) 75 MG tablet TAKE 1 TABLET EVERY DAY 90 tablet 1  . fexofenadine (ALLEGRA) 180 MG tablet Take 180 mg by mouth daily as needed for allergies.     . fluticasone-salmeterol (ADVAIR HFA) 115-21 MCG/ACT inhaler Inhale 2 puffs into the lungs 2 (two) times daily. Rinse mouth after use. 1 Inhaler 12  . levothyroxine (SYNTHROID, LEVOTHROID) 25 MCG tablet Take 1 tablet (25 mcg total) by mouth daily before breakfast. 90 tablet 1  . Potassium Chloride ER 20 MEQ TBCR Take 40 mEq by mouth as needed (whenever you take lasix). 40 tablet 0  . sertraline (ZOLOFT) 100 MG tablet Take 1 tablet (100 mg total) by mouth daily. 90 tablet 3  . traZODone (DESYREL) 100 MG tablet TAKE 1 AND 1/2 TABLETS EVERY  NIGHT AT BEDTIME  FOR  SLEEP. 135 tablet 0  . furosemide (LASIX) 20 MG tablet Take 1 tablet (20 mg total) by mouth as needed for fluid or edema. May take once or twice a week for swelling 40 tablet 0  . traMADol (ULTRAM) 50 MG tablet Take 50-100 mg by mouth every 6 (six) hours as needed for moderate pain.     . benazepril (LOTENSIN) 10 MG tablet TAKE 1 TABLET EVERY DAY 90 tablet 0  . cephALEXin (KEFLEX) 500 MG capsule Take 1 capsule (500 mg total) by mouth 3 (three) times daily. 21 capsule 0  . nystatin cream (MYCOSTATIN) Apply 1 application topically 2 (two) times daily. 30 g 1   Facility-Administered Medications Prior to Visit  Medication  Dose Route Frequency Provider Last Rate Last Dose  . 0.9 %  sodium chloride infusion  500 mL Intravenous Continuous Danis, Starr Lake III, MD         Per HPI unless specifically indicated in ROS section below Review of Systems     Objective:    BP 114/60 (BP Location: Left Arm, Patient Position: Sitting, Cuff Size: Normal)   Pulse (!) 107   Temp 97.8 F (36.6 C) (Oral)   Ht 5' 0.5" (1.537 m)   Wt 153 lb (69.4 kg)   SpO2 95%   BMI 29.39 kg/m   Wt Readings from Last 3 Encounters:  05/07/18 153 lb (69.4 kg)  01/30/18 156 lb 12 oz (71.1 kg)  12/05/17 153 lb 12.8 oz (69.8 kg)    Physical Exam  Constitutional: She appears well-developed and well-nourished. No distress.  HENT:  Mouth/Throat: Oropharynx is clear and moist. No oropharyngeal exudate.  Cardiovascular: Normal rate, regular rhythm and normal heart sounds.  No murmur heard. Pulmonary/Chest: Effort normal and breath sounds normal. No respiratory distress. She has no wheezes. She has no rales.  Musculoskeletal: She exhibits no edema.  Skin: Rash noted. There is erythema. There is pallor.  Beefy red erythematous rash below bilateral breasts, satellite lesions present  Psychiatric: She has a normal mood and affect.  Nursing note and vitals reviewed.  Results for orders placed or performed in visit on 02/02/18  Potassium  Result Value Ref Range   Potassium 4.8 3.5 - 5.1 mEq/L      Assessment & Plan:   Problem List Items Addressed This Visit    Weakness    Of unclear etiology. Check orthostatics today (sitting to standing). Encouraged good hydration status. Check basic labwork today. She is taking potassium and lasix averaging about once a week.        Relevant Orders   Basic metabolic panel   CBC with Differential/Platelet   Hypothyroidism (acquired)    Update TFTs, reports compliance with daily levothyroxine.       Relevant Orders   TSH   T4, free   Candidal intertrigo - Primary    Persistent despite  nystatin course. Will Rx nystatin powder + diflucan oral 150mg  weekly x 2 wks. Update if not improving with this.       Relevant Medications   fluconazole (DIFLUCAN) 150 MG tablet   nystatin (MYCOSTATIN/NYSTOP) powder       Meds ordered this encounter  Medications  . furosemide (LASIX) 20 MG tablet    Sig: Take 1 tablet (20 mg total) by mouth as needed for fluid or edema. May take once or twice a week for swelling    Dispense:  40 tablet    Refill:  1  .  traMADol (ULTRAM) 50 MG tablet    Sig: Take 1-2 tablets (50-100 mg total) by mouth 3 (three) times daily as needed for moderate pain.    Dispense:  30 tablet    Refill:  0  . fluconazole (DIFLUCAN) 150 MG tablet    Sig: Take 1 tablet (150 mg total) by mouth once a week.    Dispense:  2 tablet    Refill:  0  . nystatin (MYCOSTATIN/NYSTOP) powder    Sig: Apply topically 3 (three) times daily.    Dispense:  30 g    Refill:  0   Orders Placed This Encounter  Procedures  . TSH  . Basic metabolic panel  . CBC with Differential/Platelet  . T4, free    Follow up plan: No follow-ups on file.  [name], MD

## 2018-05-07 NOTE — Assessment & Plan Note (Signed)
Of unclear etiology. Check orthostatics today (sitting to standing). Encouraged good hydration status. Check basic labwork today. She is taking potassium and lasix averaging about once a week.

## 2018-05-07 NOTE — Assessment & Plan Note (Signed)
Persistent despite nystatin course. Will Rx nystatin powder + diflucan oral 150mg  weekly x 2 wks. Update if not improving with this.

## 2018-05-07 NOTE — Patient Instructions (Addendum)
Labs today.  You have persistent yeast infection - treat with oral antifungal, and nystatin powder.  Push fluids and plenty of rest.

## 2018-05-17 DIAGNOSIS — R269 Unspecified abnormalities of gait and mobility: Secondary | ICD-10-CM | POA: Diagnosis not present

## 2018-05-17 DIAGNOSIS — J449 Chronic obstructive pulmonary disease, unspecified: Secondary | ICD-10-CM | POA: Diagnosis not present

## 2018-05-19 ENCOUNTER — Other Ambulatory Visit: Payer: Self-pay | Admitting: Family Medicine

## 2018-05-19 MED ORDER — LEVOTHYROXINE SODIUM 50 MCG PO TABS: 50.0000 ug | ORAL_TABLET | Freq: Every day | ORAL | 1 refills | Status: DC

## 2018-05-21 ENCOUNTER — Other Ambulatory Visit: Payer: Self-pay | Admitting: Family Medicine

## 2018-05-22 NOTE — Telephone Encounter (Signed)
Electronic refill request Last office visit 05/07/18 Last refill 12/29/17 #90/1

## 2018-06-17 DIAGNOSIS — J449 Chronic obstructive pulmonary disease, unspecified: Secondary | ICD-10-CM | POA: Diagnosis not present

## 2018-06-17 DIAGNOSIS — R269 Unspecified abnormalities of gait and mobility: Secondary | ICD-10-CM | POA: Diagnosis not present

## 2018-06-22 ENCOUNTER — Other Ambulatory Visit: Payer: Self-pay | Admitting: Family Medicine

## 2018-06-22 NOTE — Telephone Encounter (Signed)
Copied from CRM [id number]. Topic: Quick Communication - See Telephone Encounter >> Jun 22, 2018  9:30 AM Herby Abraham C wrote: CRM for notification. See Telephone encounter for: 06/22/18.  Pt's daughter [name] called in to request a refill on 2 medications. She says that she spoke with someone in the office previously and was told that Rx would be sent. Not showing that they have been. fluconazole (DIFLUCAN) and nystatin (MYCOSTATIN/NYSTOP) powder  Pharmacy: [facility] [address] ([address]),  - [address] [phone] (Phone) [phone] (Fax)  Please assist.

## 2018-06-22 NOTE — Telephone Encounter (Signed)
Diflucan 150 mg tablet and Nystatin powder refill Last Refill:05/07/18 for both medications # 2 tablets with no refills for Diflucan;  30 g with no refills for Nystatin. Last OV: 05/07/18 PCP: Sharen Hones Pharmacy:[facility] [phone]

## 2018-06-24 MED ORDER — FLUCONAZOLE 150 MG PO TABS: 150.0000 mg | ORAL_TABLET | ORAL | 0 refills | Status: DC

## 2018-06-24 MED ORDER — NYSTATIN 100000 UNIT/GM EX POWD: Freq: Three times a day (TID) | CUTANEOUS | 0 refills | Status: DC

## 2018-06-26 ENCOUNTER — Other Ambulatory Visit: Payer: Self-pay | Admitting: Family Medicine

## 2018-06-26 DIAGNOSIS — E039 Hypothyroidism, unspecified: Secondary | ICD-10-CM

## 2018-06-26 DIAGNOSIS — E785 Hyperlipidemia, unspecified: Secondary | ICD-10-CM

## 2018-06-26 DIAGNOSIS — E559 Vitamin D deficiency, unspecified: Secondary | ICD-10-CM

## 2018-06-27 ENCOUNTER — Ambulatory Visit (INDEPENDENT_AMBULATORY_CARE_PROVIDER_SITE_OTHER): Payer: Medicare HMO

## 2018-06-27 VITALS — BP 136/78 | HR 82 | Temp 99.0°F | Ht 61.5 in | Wt 152.8 lb

## 2018-06-27 DIAGNOSIS — E559 Vitamin D deficiency, unspecified: Secondary | ICD-10-CM

## 2018-06-27 DIAGNOSIS — E785 Hyperlipidemia, unspecified: Secondary | ICD-10-CM | POA: Diagnosis not present

## 2018-06-27 DIAGNOSIS — Z0000 Encounter for general adult medical examination without abnormal findings: Secondary | ICD-10-CM | POA: Diagnosis not present

## 2018-06-27 DIAGNOSIS — Z23 Encounter for immunization: Secondary | ICD-10-CM | POA: Diagnosis not present

## 2018-06-27 DIAGNOSIS — E039 Hypothyroidism, unspecified: Secondary | ICD-10-CM

## 2018-06-27 LAB — LIPID PANEL
Cholesterol: 186 mg/dL (ref 0–200)
HDL: 69.3 mg/dL (ref >39.00)
LDL Cholesterol: 101 mg/dL — ABNORMAL HIGH (ref 0–99)
NonHDL: 117.01
Total CHOL/HDL Ratio: 3
Triglycerides: 81 mg/dL (ref 0.0–149.0)
VLDL: 16.2 mg/dL (ref 0.0–40.0)

## 2018-06-27 LAB — BASIC METABOLIC PANEL
BUN: 21 mg/dL (ref 6–23)
CO2: 29 mEq/L (ref 19–32)
Calcium: 9.9 mg/dL (ref 8.4–10.5)
Chloride: 103 mEq/L (ref 96–112)
Creatinine, Ser: 1.09 mg/dL (ref 0.40–1.20)
GFR: 52.48 mL/min — ABNORMAL LOW (ref >60.00)
Glucose, Bld: 94 mg/dL (ref 70–99)
Potassium: 5.2 mEq/L — ABNORMAL HIGH (ref 3.5–5.1)
Sodium: 139 mEq/L (ref 135–145)

## 2018-06-27 LAB — TSH: TSH: 2.64 u[IU]/mL (ref 0.35–4.50)

## 2018-06-27 LAB — T4, FREE: Free T4: 0.91 ng/dL (ref 0.60–1.60)

## 2018-06-27 LAB — VITAMIN D 25 HYDROXY (VIT D DEFICIENCY, FRACTURES): VITD: 40.6 ng/mL (ref 30.00–100.00)

## 2018-06-27 NOTE — Patient Instructions (Signed)
Ms. Ault , Thank you for taking time to come for your Medicare Wellness Visit. I appreciate your ongoing commitment to your health goals. Please review the following plan we discussed and let me know if I can assist you in the future.   These are the goals we discussed: Goals    . health management     Starting 06/27/2018, I will continue to take medications as prescribed.        This is a list of the screening recommended for you and due dates:  Health Maintenance  Topic Date Due  . Mammogram  07/22/2019  . DTaP/Tdap/Td vaccine (2 - Td) 08/05/2025  . Tetanus Vaccine  08/05/2025  . Flu Shot  Completed  . DEXA scan (bone density measurement)  Completed  .  Hepatitis C: One time screening is recommended by Center for Disease Control  (CDC) for  adults born from [birth year] through 1965.   Completed  . Pneumonia vaccines  Completed   Preventive Care for Adults  A healthy lifestyle and preventive care can promote health and wellness. Preventive health guidelines for adults include the following key practices.  . A routine yearly physical is a good way to check with your health care provider about your health and preventive screening. It is a chance to share any concerns and updates on your health and to receive a thorough exam.  . Visit your dentist for a routine exam and preventive care every 6 months. Brush your teeth twice a day and floss once a day. Good oral hygiene prevents tooth decay and gum disease.  . The frequency of eye exams is based on your age, health, family medical history, use  of contact lenses, and other factors. Follow your health care provider's recommendations for frequency of eye exams.  . Eat a healthy diet. Foods like vegetables, fruits, whole grains, low-fat dairy products, and lean protein foods contain the nutrients you need without too many calories. Decrease your intake of foods high in solid fats, added sugars, and salt. Eat the right amount of calories for  you. Get information about a proper diet from your health care provider, if necessary.  . Regular physical exercise is one of the most important things you can do for your health. Most adults should get at least 150 minutes of moderate-intensity exercise (any activity that increases your heart rate and causes you to sweat) each week. In addition, most adults need muscle-strengthening exercises on 2 or more days a week.  Silver Sneakers may be a benefit available to you. To determine eligibility, you may visit the website: www.silversneakers.com or contact program at 614-741-4100 Mon-Fri between 8AM-8PM.   . Maintain a healthy weight. The body mass index (BMI) is a screening tool to identify possible weight problems. It provides an estimate of body fat based on height and weight. Your health care provider can find your BMI and can help you achieve or maintain a healthy weight.   For adults 20 years and older: ? A BMI below 18.5 is considered underweight. ? A BMI of 18.5 to 24.9 is normal. ? A BMI of 25 to 29.9 is considered overweight. ? A BMI of 30 and above is considered obese.   . Maintain normal blood lipids and cholesterol levels by exercising and minimizing your intake of saturated fat. Eat a balanced diet with plenty of fruit and vegetables. Blood tests for lipids and cholesterol should begin at age 65 and be repeated every 5 years. If your lipid or  cholesterol levels are high, you are over 50, or you are at high risk for heart disease, you may need your cholesterol levels checked more frequently. Ongoing high lipid and cholesterol levels should be treated with medicines if diet and exercise are not working.  . If you smoke, find out from your health care provider how to quit. If you do not use tobacco, please do not start.  . If you choose to drink alcohol, please do not consume more than 2 drinks per day. One drink is considered to be 12 ounces (355 mL) of beer, 5 ounces (148 mL) of  wine, or 1.5 ounces (44 mL) of liquor.  . If you are 27-17 years old, ask your health care provider if you should take aspirin to prevent strokes.  . Use sunscreen. Apply sunscreen liberally and repeatedly throughout the day. You should seek shade when your shadow is shorter than you. Protect yourself by wearing long sleeves, pants, a wide-brimmed hat, and sunglasses year round, whenever you are outdoors.  . Once a month, do a whole body skin exam, using a mirror to look at the skin on your back. Tell your health care provider of new moles, moles that have irregular borders, moles that are larger than a pencil eraser, or moles that have changed in shape or color.

## 2018-06-27 NOTE — Progress Notes (Signed)
PCP notes:   Health maintenance:  Flu vaccine - administered  Abnormal screenings:   Hearing - failed  Hearing Screening   125Hz  250Hz  500Hz  1000Hz  2000Hz  3000Hz  4000Hz  6000Hz  8000Hz   Right ear:   40 40 40  40    Left ear:   40 40 40  0     Mini-Cog score: 17/20 MMSE - Mini Mental State Exam 06/27/2018 06/26/2017  Orientation to time 5 5  Orientation to Place 5 5  Registration 3 3  Attention/ Calculation 0 0  Recall 3 2  Recall-comments - pt was unable to recall 1 of 3 words  Language- name 2 objects 0 0  Language- repeat 1 1  Language- follow 3 step command 0 0  Language- follow 3 step command-comments unable to follow 3 steps of 3 step command pt was unable to follow 3 steps of 3 steps command  Language- read & follow direction 0 0  Write a sentence 0 0  Copy design 0 0  Total score 17 16    Fall risk - hx of multiple falls Fall Risk  06/27/2018 12/05/2017 06/26/2017 02/01/2016 01/29/2015  Falls in the past year? Yes Yes No Yes Yes  Number falls in past yr: 2 or more 2 or more - 1 1  Injury with Fall? Yes Yes - Yes No  Risk Factor Category  - High Fall Risk - - -  Risk for fall due to : - History of fall(s);Impaired mobility - - -  Risk for fall due to: Comment - She broke her back when she fell out of bed - - -  Follow up - Falls evaluation completed;Education provided;Falls prevention discussed - - -   Depression score: 4 Depression screen [facility] 2/9 06/27/2018 12/13/2017 12/05/2017 06/26/2017 02/01/2016  Decreased Interest 0 1 1 0 0  Down, Depressed, Hopeless 2 0 2 1 0  PHQ - 2 Score 2 1 3 1  0  Altered sleeping 0 0 2 1 -  Tired, decreased energy 1 2 3  0 -  Change in appetite 0 2 2 0 -  Feeling bad or failure about yourself  1 1 2  0 -  Trouble concentrating 0 1 1 0 -  Moving slowly or fidgety/restless 0 2 2 0 -  Suicidal thoughts 0 0 0 0 -  PHQ-9 Score 4 9 15 2  -  Difficult doing work/chores Not difficult at all Somewhat difficult Somewhat difficult Not difficult at all -    Patient concerns:   None  Nurse concerns:  None  Next PCP appt:   07/02/18 @ 1130

## 2018-06-27 NOTE — Progress Notes (Signed)
Subjective:   [name] [name] is a 72 y.o. female who presents for Medicare Annual (Subsequent) preventive examination.  Review of Systems:  N/A Cardiac Risk Factors include: advanced age (>25men, >86 women);dyslipidemia;hypertension     Objective:     Vitals: BP 136/78 (BP Location: Left Arm, Patient Position: Sitting, Cuff Size: Normal)   Pulse 82   Temp 99 F (37.2 C) (Oral)   Ht 5' 1.5" (1.562 m) Comment: shoes  Wt 152 lb 12 oz (69.3 kg)   SpO2 95%   BMI 28.39 kg/m   Body mass index is 28.39 kg/m.  Advanced Directives 06/27/2018 12/05/2017 12/05/2017 11/13/2017 10/01/2017 06/26/2017 11/11/2015  Does Patient Have a Medical Advance Directive? No Yes Yes No No No No  Type of Advance Directive - Healthcare Power of [place];Living will Healthcare Power of [place];Living will - - - -  Does patient want to make changes to medical advance directive? - - No - Patient declined - - - -  Copy of Healthcare Power of Attorney in Chart? - No - copy requested - - - - -  Would patient like information on creating a medical advance directive? No - Patient declined - - No - Patient declined No - Patient declined Yes (MAU/Ambulatory/Procedural Areas - Information given) No - patient declined information    Tobacco Social History   Tobacco Use  Smoking Status Former Smoker  . Packs/day: 2.00  . Years: 46.00  . Pack years: 92.00  . Types: Cigarettes  . Last attempt to quit: 11/08/2012  . Years since quitting: 5.6  Smokeless Tobacco Never Used     Counseling given: No   Clinical Intake:  Pre-visit preparation completed: Yes  Pain : No/denies pain Pain Score: 0-No pain     Nutritional Status: BMI 25 -29 Overweight Nutritional Risks: None Diabetes: No           Past Medical History:  Diagnosis Date  . Aneurysm (HCC)   . Anxiety   . Anxiety and depression   . Carotid stenosis    R 50% (12/2012)  . Concussion 08/03/2015  . COPD (chronic obstructive pulmonary disease)  (HCC) 12/2012   spirometry: Pre: FVC 84%, FEV1 69%, ratio 0.64 consistent with moderate obstruction.  . Depression   . Fall 08/03/2015   d/c home health 08/2015  . Fracture of cervical vertebra, C5 (HCC) 08/06/2015  . History of chicken pox   . Hyperlipidemia   . Hypertension   . Lower back pain    h/o HNP s/p surgery  . Osteoarthritis    h/o ruptured disc s/p ESI  . Osteoporosis 11/2010   DEXA -2.7 spine, thoracic compression fracture  . Peripheral vascular disease (HCC)   . Smoker    quit 10/2012  . Stroke [facility]) 2010   x3 with residual R hemiparesis, s/p R MCA balloon angioplasty (2010)   Past Surgical History:  Procedure Laterality Date  . APPENDECTOMY  1960  . CATARACT EXTRACTION     bilateral  . CESAREAN SECTION    . CHOLECYSTECTOMY  1970  . COLONOSCOPY  2004   diverticulosis, no polyps Jarold Motto)  . COLONOSCOPY  05/2016   decreased sphincter tone, diverticulosis, no f/u recommended ([place])  . DEXA  11/2010   T -2.7 spine, -1.9 hip  . HIP SURGERY Left 2006   fractured - screws placed  . IR RADIOLOGIST EVAL & MGMT  01/09/2018  . KYPHOPLASTY  10/02/2017   Procedure: LUMBAR FOUR KYPHOPLASTY;  Surgeon: Coletta Memos, MD;  Location:  [facility] OR;  Service: Neurosurgery;;  . RADIOLOGY WITH ANESTHESIA N/A 11/11/2015   Procedure: RADIOLOGY WITH ANESTHESIA;  Surgeon: Julieanne Cotton, MD;  Location: [facility] OR;  Service: Radiology;  Laterality: N/A;   Family History  Problem Relation Age of Onset  . CAD Mother        MI  . Cancer Mother        cervical  . CAD Maternal Aunt        MI  . CAD Sister        MI  . Sudden death Father 50       died in his sleep, chain smoker  . Cirrhosis Father   . Hypertension Daughter   . Diabetes Maternal Aunt   . Cancer Maternal Grandmother        cervical   Social History   Socioeconomic History  . Marital status: Divorced    Spouse name: Not on file  . Number of children: Not on file  . Years of education: Not on file  . Highest education  level: Not on file  Occupational History  . Not on file  Social Needs  . Financial resource strain: Not on file  . Food insecurity:    Worry: Not on file    Inability: Not on file  . Transportation needs:    Medical: Not on file    Non-medical: Not on file  Tobacco Use  . Smoking status: Former Smoker    Packs/day: 2.00    Years: 46.00    Pack years: 92.00    Types: Cigarettes    Last attempt to quit: 11/08/2012    Years since quitting: 5.6  . Smokeless tobacco: Never Used  Substance and Sexual Activity  . Alcohol use: No    Alcohol/week: 0.0 standard drinks  . Drug use: No  . Sexual activity: Not Currently  Lifestyle  . Physical activity:    Days per week: Not on file    Minutes per session: Not on file  . Stress: Not on file  Relationships  . Social connections:    Talks on phone: Not on file    Gets together: Not on file    Attends religious service: Not on file    Active member of club or organization: Not on file    Attends meetings of clubs or organizations: Not on file    Relationship status: Not on file  Other Topics Concern  . Not on file  Social History Narrative   Lives with granddaughter and great grandchildren   Occupation: retired, was CNA   Edu: 8th grade   Activity: no regular activity:   Diet: some water, fruits/vegetables daily    Outpatient Encounter Medications as of 06/27/2018  Medication Sig  . acetaminophen (TYLENOL) 500 MG tablet Take 500 mg by mouth 2 (two) times daily as needed for mild pain.   [name] albuterol (PROVENTIL HFA;VENTOLIN HFA) 108 (90 Base) MCG/ACT inhaler Inhale 2 puffs into the lungs every 6 (six) hours as needed for wheezing or shortness of breath.  [name] alendronate (FOSAMAX) 70 MG tablet TAKE 1 TABLET WEEKLY  WITH A FULL GLASS OF WATER ON AN EMPTY STOMACH  . aspirin EC 81 MG tablet Take 81 mg by mouth daily.  [name] atorvastatin (LIPITOR) 40 MG tablet TAKE 1 TABLET (40 MG TOTAL) BY MOUTH DAILY.  . benazepril (LOTENSIN) 5 MG tablet  Take 1 tablet (5 mg total) by mouth daily.  . Calcium Carb-Cholecalciferol (CALCIUM-VITAMIN D) 600-400 MG-UNIT TABS Take 1  tablet by mouth daily.  . Cholecalciferol (VITAMIN D PO) Take 1 capsule by mouth daily.  . clopidogrel (PLAVIX) 75 MG tablet TAKE 1 TABLET EVERY DAY  . fexofenadine (ALLEGRA) 180 MG tablet Take 180 mg by mouth daily as needed for allergies.   . fluconazole (DIFLUCAN) 150 MG tablet Take 1 tablet (150 mg total) by mouth once a week.  . fluticasone-salmeterol (ADVAIR HFA) 115-21 MCG/ACT inhaler Inhale 2 puffs into the lungs 2 (two) times daily. Rinse mouth after use.  . furosemide (LASIX) 20 MG tablet Take 1 tablet (20 mg total) by mouth as needed for fluid or edema. May take once or twice a week for swelling  . levothyroxine (SYNTHROID, LEVOTHROID) 50 MCG tablet Take 1 tablet (50 mcg total) by mouth daily before breakfast.  . nystatin (MYCOSTATIN/NYSTOP) powder Apply topically 3 (three) times daily.  . Potassium Chloride ER 20 MEQ TBCR Take 40 mEq by mouth as needed (whenever you take lasix).  [name] sertraline (ZOLOFT) 100 MG tablet Take 1 tablet (100 mg total) by mouth daily.  . traMADol (ULTRAM) 50 MG tablet Take 1-2 tablets (50-100 mg total) by mouth 3 (three) times daily as needed for moderate pain.  . traZODone (DESYREL) 100 MG tablet TAKE 1 AND 1/2 TABLETS EVERY NIGHT AT BEDTIME  FOR  SLEEP.   Facility-Administered Encounter Medications as of 06/27/2018  Medication  . 0.9 %  sodium chloride infusion    Activities of Daily Living In your present state of health, do you have any difficulty performing the following activities: 06/27/2018 11/14/2017  Hearing? N N  Vision? N N  Difficulty concentrating or making decisions? Y N  Walking or climbing stairs? N Y  Dressing or bathing? N Y  Doing errands, shopping? Malvin Johns  Preparing Food and eating ? N -  Using the Toilet? N -  In the past six months, have you accidently leaked urine? Y -  Do you have problems with loss of bowel  control? N -  Managing your Medications? N -  Managing your Finances? N -  Housekeeping or managing your Housekeeping? N -  Some recent data might be hidden    Patient Care Team: [name], MD as PCP - General (Family Medicine)    Assessment:   This is a routine wellness examination for [name] [name].   Hearing Screening   125Hz  250Hz  500Hz  1000Hz  2000Hz  3000Hz  4000Hz  6000Hz  8000Hz   Right ear:   40 40 40  40    Left ear:   40 40 40  0      Exercise Activities and Dietary recommendations Current Exercise Habits: Home exercise routine, Type of exercise: walking, Time (Minutes): 20, Frequency (Times/Week): 7, Weekly Exercise (Minutes/Week): 140, Intensity: Mild, Exercise limited by: None identified  Goals    . health management     Starting 06/27/2018, I will continue to take medications as prescribed.        Fall Risk Fall Risk  06/27/2018 12/05/2017 06/26/2017 02/01/2016 01/29/2015  Falls in the past year? Yes Yes No Yes Yes  Number falls in past yr: 2 or more 2 or more - 1 1  Injury with Fall? Yes Yes - Yes No  Risk Factor Category  - High Fall Risk - - -  Risk for fall due to : - History of fall(s);Impaired mobility - - -  Risk for fall due to: Comment - She broke her back when she fell out of bed - - -  Follow up - Falls evaluation  completed;Education provided;Falls prevention discussed - - -   Depression Screen PHQ 2/9 Scores 06/27/2018 12/13/2017 12/05/2017 06/26/2017  PHQ - 2 Score 2 1 3 1   PHQ- 9 Score 4 9 15 2      Cognitive Function MMSE - Mini Mental State Exam 06/27/2018 06/26/2017  Orientation to time 5 5  Orientation to Place 5 5  Registration 3 3  Attention/ Calculation 0 0  Recall 3 2  Recall-comments - pt was unable to recall 1 of 3 words  Language- name 2 objects 0 0  Language- repeat 1 1  Language- follow 3 step command 0 0  Language- follow 3 step command-comments unable to follow 3 steps of 3 step command pt was unable to follow 3 steps of 3 steps command    Language- read & follow direction 0 0  Write a sentence 0 0  Copy design 0 0  Total score 17 16     PLEASE NOTE: A Mini-Cog screen was completed. Maximum score is 20. A value of 0 denotes this part of [place] MMSE was not completed or the patient failed this part of the Mini-Cog screening.   Mini-Cog Screening Orientation to Time - Max 5 pts Orientation to Place - Max 5 pts Registration - Max 3 pts Recall - Max 3 pts Language Repeat - Max 1 pts Language Follow 3 Step Command - Max 3 pts     Immunization History  Administered Date(s) Administered  . Influenza,inj,Quad PF,6+ Mos 07/04/2013, 08/03/2015, 06/26/2017, 06/27/2018  . Pneumococcal Conjugate-13 01/02/2014  . Pneumococcal Polysaccharide-23 11/26/2012  . Td 09/26/2004  . Tdap 08/06/2015    Screening Tests Health Maintenance  Topic Date Due  . MAMMOGRAM  07/22/2019  . DTaP/Tdap/Td (2 - Td) 08/05/2025  . TETANUS/TDAP  08/05/2025  . INFLUENZA VACCINE  Completed  . DEXA SCAN  Completed  . Hepatitis C Screening  Completed  . PNA vac Low Risk Adult  Completed      Plan:     I have personally reviewed, addressed, and noted the following in the patient's chart:  A. Medical and social history B. Use of alcohol, tobacco or illicit drugs  C. Current medications and supplements D. Functional ability and status E.  Nutritional status F.  Physical activity G. Advance directives H. List of other physicians I.  Hospitalizations, surgeries, and ER visits in previous 12 months J.  Vitals K. Screenings to include hearing, vision, cognitive, depression L. Referrals and appointments - none  In addition, I have reviewed and discussed with patient certain preventive protocols, quality metrics, and best practice recommendations. A written personalized care plan for preventive services as well as general preventive health recommendations were provided to patient.  See attached scanned questionnaire for additional information.    Signed,   Randa Evens, MHA, BS, LPN Health Coach

## 2018-07-02 ENCOUNTER — Ambulatory Visit (INDEPENDENT_AMBULATORY_CARE_PROVIDER_SITE_OTHER): Payer: Medicare HMO | Admitting: Family Medicine

## 2018-07-02 ENCOUNTER — Encounter: Payer: Self-pay | Admitting: Family Medicine

## 2018-07-02 VITALS — BP 124/62 | HR 79 | Temp 98.5°F | Ht 61.5 in | Wt 154.5 lb

## 2018-07-02 DIAGNOSIS — Z8673 Personal history of transient ischemic attack (TIA), and cerebral infarction without residual deficits: Secondary | ICD-10-CM

## 2018-07-02 DIAGNOSIS — R296 Repeated falls: Secondary | ICD-10-CM | POA: Diagnosis not present

## 2018-07-02 DIAGNOSIS — G4700 Insomnia, unspecified: Secondary | ICD-10-CM | POA: Diagnosis not present

## 2018-07-02 DIAGNOSIS — I69351 Hemiplegia and hemiparesis following cerebral infarction affecting right dominant side: Secondary | ICD-10-CM

## 2018-07-02 DIAGNOSIS — Z0001 Encounter for general adult medical examination with abnormal findings: Secondary | ICD-10-CM | POA: Diagnosis not present

## 2018-07-02 DIAGNOSIS — Z7189 Other specified counseling: Secondary | ICD-10-CM

## 2018-07-02 DIAGNOSIS — E039 Hypothyroidism, unspecified: Secondary | ICD-10-CM

## 2018-07-02 DIAGNOSIS — J432 Centrilobular emphysema: Secondary | ICD-10-CM

## 2018-07-02 DIAGNOSIS — M810 Age-related osteoporosis without current pathological fracture: Secondary | ICD-10-CM

## 2018-07-02 DIAGNOSIS — R251 Tremor, unspecified: Secondary | ICD-10-CM

## 2018-07-02 DIAGNOSIS — I739 Peripheral vascular disease, unspecified: Secondary | ICD-10-CM | POA: Diagnosis not present

## 2018-07-02 DIAGNOSIS — E785 Hyperlipidemia, unspecified: Secondary | ICD-10-CM

## 2018-07-02 DIAGNOSIS — I6523 Occlusion and stenosis of bilateral carotid arteries: Secondary | ICD-10-CM

## 2018-07-02 DIAGNOSIS — E875 Hyperkalemia: Secondary | ICD-10-CM | POA: Diagnosis not present

## 2018-07-02 DIAGNOSIS — R413 Other amnesia: Secondary | ICD-10-CM | POA: Diagnosis not present

## 2018-07-02 DIAGNOSIS — F331 Major depressive disorder, recurrent, moderate: Secondary | ICD-10-CM | POA: Diagnosis not present

## 2018-07-02 DIAGNOSIS — J9611 Chronic respiratory failure with hypoxia: Secondary | ICD-10-CM

## 2018-07-02 DIAGNOSIS — I10 Essential (primary) hypertension: Secondary | ICD-10-CM

## 2018-07-02 DIAGNOSIS — Z87891 Personal history of nicotine dependence: Secondary | ICD-10-CM

## 2018-07-02 DIAGNOSIS — L304 Erythema intertrigo: Secondary | ICD-10-CM

## 2018-07-02 DIAGNOSIS — I5032 Chronic diastolic (congestive) heart failure: Secondary | ICD-10-CM

## 2018-07-02 MED ORDER — TRAZODONE HCL 100 MG PO TABS: 100.0000 mg | ORAL_TABLET | Freq: Every day | ORAL | 3 refills | Status: DC

## 2018-07-02 MED ORDER — CLOTRIMAZOLE-BETAMETHASONE 1-0.05 % EX CREA: 1.0000 "application " | TOPICAL_CREAM | Freq: Two times a day (BID) | CUTANEOUS | 0 refills | Status: DC

## 2018-07-02 MED ORDER — SERTRALINE HCL 100 MG PO TABS: 100.0000 mg | ORAL_TABLET | Freq: Every day | ORAL | 3 refills | Status: DC

## 2018-07-02 NOTE — Assessment & Plan Note (Signed)
Preventative protocols reviewed and updated unless pt declined. Discussed healthy diet and lifestyle.  

## 2018-07-02 NOTE — Patient Instructions (Addendum)
Look into cost of prolia in place of fosamax for osteoporosis. Continue calcium and vitamin D.  If interested, check with pharmacy about new 2 shot shingles series (shingrix).  We will set you up for repeat overnight oxygen test.  Walk more regularly with cane.  We will get you set up with neurology evaluation for falls and tremor.  Good to see you today, call us with questions. Return as needed or in 6 months for follow up visit.

## 2018-07-02 NOTE — Assessment & Plan Note (Signed)
Advanced directives discussed - continues working on living will - needs to get notarized. Would want daughter Alexia Freestone to be HCPOA.

## 2018-07-02 NOTE — Progress Notes (Signed)
BP 124/62 (BP Location: Left Arm, Patient Position: Sitting, Cuff Size: Normal)   Pulse 79   Temp 98.5 F (36.9 C) (Oral)   Ht 5' 1.5" (1.562 m)   Wt 154 lb 8 oz (70.1 kg)   SpO2 95%   BMI 28.72 kg/m    CC: CPE Subjective:    Patient ID: TZIPORA MCINROY, female    DOB: 1946/01/15, 72 y.o.   MRN: [id number]  HPI: LENNOX LEIKAM is a 72 y.o. female presenting on 07/02/2018 for Annual Exam (Pt 2. Pt accompanied by her daughter, [name]. )   Sharyn Creamer last week for medicare wellness visit. Note reviewed. 4 falls recently - one out of bed, others while in the bathroom. Sleeps with bed on floor - was having trouble getting into bed.   Ongoing rash under breasts - no improvement despite medicated nystatin powder, most recently used weekly diflucan 4 pills in the past 1.5 months, no improvement. Itchy and tender. No other skin rash.   Recently moved in with daughter [name]. Improved diet with this move.  Using CBD oil for tremors. Denies rigidity, stiffness with ambulation. No anosmia. Daughter endorses occasional trouble with memory.  Urge incontinence - uses depends pads.  Denies trouble with constipation.   Preventative: Colonoscopy 05/2016 for abnormal cologuard - decreased sphincter tone, diverticulosis, no f/u recommended (Danis).  Pap smear normal 11/2012 - strong fmhx cervical cancer. Had desired continued screening - due 2017. Decided to age out last year.  Mammogram normal 06/2017 [facility].  Lung cancer screen - 33 PY hx. Undergoing this.  DEXA Date: 11/2010 T -2.7 spine, -1.9 hip Osteoporosis. Rpt DEXA 06/2017 - T score -2.2 spine, -2.4 hip. Compliant with cal/vit D and fosamax weekly (started 2013).  Flu shot yearly Pneumovax - 11/2012. Prevnar 12/2013. Tdap 2016 Shingrix - discussed Advanced directives discussed - continues working on living will - needs to get notarized. Would want daughter Alexia Freestone to be HCPOA.  Seat belt use discussed Sunscreen use discussed. No suspicious  moles.  Ex smoker quit 2014.  Alcohol - none Dentist - has dentures - due for check up Eye exam yearly  Lives with granddaughter and great grandchildren Occupation: retired, was CNA Edu: 8th grade Activity: no regular activity  Diet: some water, fruits/vegetables daily  Relevant past medical, surgical, family and social history reviewed and updated as indicated. Interim medical history since our last visit reviewed. Allergies and medications reviewed and updated. Outpatient Medications Prior to Visit  Medication Sig Dispense Refill  . acetaminophen (TYLENOL) 500 MG tablet Take 500 mg by mouth 2 (two) times daily as needed for mild pain.     [name] albuterol (PROVENTIL HFA;VENTOLIN HFA) 108 (90 Base) MCG/ACT inhaler Inhale 2 puffs into the lungs every 6 (six) hours as needed for wheezing or shortness of breath. 1 Inhaler 2  . alendronate (FOSAMAX) 70 MG tablet TAKE 1 TABLET WEEKLY  WITH A FULL GLASS OF WATER ON AN EMPTY STOMACH 12 tablet 3  . aspirin EC 81 MG tablet Take 81 mg by mouth daily.    [name] atorvastatin (LIPITOR) 40 MG tablet TAKE 1 TABLET (40 MG TOTAL) BY MOUTH DAILY. 90 tablet 2  . benazepril (LOTENSIN) 5 MG tablet Take 1 tablet (5 mg total) by mouth daily. 30 tablet 3  . Calcium Carb-Cholecalciferol (CALCIUM-VITAMIN D) 600-400 MG-UNIT TABS Take 1 tablet by mouth daily.    . Cholecalciferol (VITAMIN D PO) Take 1 capsule by mouth daily.    . clopidogrel (PLAVIX) 75  MG tablet TAKE 1 TABLET EVERY DAY 90 tablet 1  . fexofenadine (ALLEGRA) 180 MG tablet Take 180 mg by mouth daily as needed for allergies.     . fluconazole (DIFLUCAN) 150 MG tablet Take 1 tablet (150 mg total) by mouth once a week. 2 tablet 0  . fluticasone-salmeterol (ADVAIR HFA) 115-21 MCG/ACT inhaler Inhale 2 puffs into the lungs 2 (two) times daily. Rinse mouth after use. 1 Inhaler 12  . furosemide (LASIX) 20 MG tablet Take 1 tablet (20 mg total) by mouth as needed for fluid or edema. May take once or twice a week for  swelling 40 tablet 1  . levothyroxine (SYNTHROID, LEVOTHROID) 50 MCG tablet Take 1 tablet (50 mcg total) by mouth daily before breakfast. 90 tablet 1  . nystatin (MYCOSTATIN/NYSTOP) powder Apply topically 3 (three) times daily. 30 g 0  . Potassium Chloride ER 20 MEQ TBCR Take 20 mEq by mouth as needed (whenever you take lasix).    [name] traMADol (ULTRAM) 50 MG tablet Take 1-2 tablets (50-100 mg total) by mouth 3 (three) times daily as needed for moderate pain. 30 tablet 0  . Potassium Chloride ER 20 MEQ TBCR Take 40 mEq by mouth as needed (whenever you take lasix). 40 tablet 0  . sertraline (ZOLOFT) 100 MG tablet Take 1 tablet (100 mg total) by mouth daily. 90 tablet 3  . traZODone (DESYREL) 100 MG tablet TAKE 1 AND 1/2 TABLETS EVERY NIGHT AT BEDTIME  FOR  SLEEP. 135 tablet 0   Facility-Administered Medications Prior to Visit  Medication Dose Route Frequency Provider Last Rate Last Dose  . 0.9 %  sodium chloride infusion  500 mL Intravenous Continuous Danis, Starr Lake III, MD         Per HPI unless specifically indicated in ROS section below Review of Systems  Constitutional: Negative for activity change, appetite change, chills, fatigue, fever and unexpected weight change.  HENT: Negative for hearing loss.   Eyes: Negative for visual disturbance.  Respiratory: Positive for cough. Negative for chest tightness, shortness of breath and wheezing.   Cardiovascular: Negative for chest pain, palpitations and leg swelling.  Gastrointestinal: Negative for abdominal distention, abdominal pain, blood in stool, constipation, diarrhea, nausea and vomiting.  Genitourinary: Negative for difficulty urinating and hematuria.  Musculoskeletal: Negative for arthralgias, myalgias and neck pain.  Skin: Negative for rash.  Neurological: Positive for tremors (improving with CBD oil). Negative for dizziness, seizures, syncope and headaches.  Hematological: Negative for adenopathy. Bruises/bleeds easily.    Psychiatric/Behavioral: Negative for dysphoric mood. The patient is not nervous/anxious.        Objective:    BP 124/62 (BP Location: Left Arm, Patient Position: Sitting, Cuff Size: Normal)   Pulse 79   Temp 98.5 F (36.9 C) (Oral)   Ht 5' 1.5" (1.562 m)   Wt 154 lb 8 oz (70.1 kg)   SpO2 95%   BMI 28.72 kg/m   Wt Readings from Last 3 Encounters:  07/02/18 154 lb 8 oz (70.1 kg)  06/27/18 152 lb 12 oz (69.3 kg)  05/07/18 153 lb (69.4 kg)    Physical Exam  Constitutional: She is oriented to person, place, and time. She appears well-developed and well-nourished. No distress.  HENT:  Head: Normocephalic and atraumatic.  Right Ear: Hearing, tympanic membrane, external ear and ear canal normal.  Left Ear: Hearing, tympanic membrane, external ear and ear canal normal.  Nose: Nose normal.  Mouth/Throat: Uvula is midline, oropharynx is clear and moist and mucous membranes  are normal. No oropharyngeal exudate, posterior oropharyngeal edema or posterior oropharyngeal erythema.  Eyes: Pupils are equal, round, and reactive to light. Conjunctivae and EOM are normal. No scleral icterus.  Neck: Normal range of motion. Neck supple. Carotid bruit is not present. No thyromegaly present.  Cardiovascular: Normal rate, regular rhythm, normal heart sounds and intact distal pulses.  No murmur heard. Pulses:      Radial pulses are 2+ on the right side, and 2+ on the left side.  Pulmonary/Chest: Effort normal and breath sounds normal. No respiratory distress. She has no wheezes. She has no rales.  Abdominal: Soft. Bowel sounds are normal. She exhibits no distension and no mass. There is no tenderness. There is no rebound and no guarding.  Musculoskeletal: Normal range of motion. She exhibits no edema.  Lymphadenopathy:    She has no cervical adenopathy.  Neurological: She is alert and oriented to person, place, and time.  CN grossly intact Wide based unsteady gait, but not shuffling Mild cogwheeling  RUE Mild masked fascies  Skin: Skin is warm and dry. Ecchymosis (bilat forearms) and rash noted. There is erythema.     Macular erythematous tender rash below both breasts  Psychiatric: She has a normal mood and affect. Her behavior is normal. Judgment and thought content normal.  Nursing note and vitals reviewed.  Results for orders placed or performed in visit on 06/27/18  T4, free  Result Value Ref Range   Free T4 0.91 0.60 - 1.60 ng/dL  VITAMIN D 25 Hydroxy (Vit-D Deficiency, Fractures)  Result Value Ref Range   VITD 40.60 30.00 - 100.00 ng/mL  Basic metabolic panel  Result Value Ref Range   Sodium 139 135 - 145 mEq/L   Potassium 5.2 (H) 3.5 - 5.1 mEq/L   Chloride 103 96 - 112 mEq/L   CO2 29 19 - 32 mEq/L   Glucose, Bld 94 70 - 99 mg/dL   BUN 21 6 - 23 mg/dL   Creatinine, Ser 1.61 0.40 - 1.20 mg/dL   Calcium 9.9 8.4 - 09.6 mg/dL   GFR 04.54 (L) >09.81 mL/min  TSH  Result Value Ref Range   TSH 2.64 0.35 - 4.50 uIU/mL  Lipid panel  Result Value Ref Range   Cholesterol 186 0 - 200 mg/dL   Triglycerides 19.1 0.0 - 149.0 mg/dL   HDL 47.82 >95.62 mg/dL   VLDL 13.0 0.0 - 86.5 mg/dL   LDL Cholesterol 784 (H) 0 - 99 mg/dL   Total CHOL/HDL Ratio 3    NonHDL 117.01       Assessment & Plan:   Problem List Items Addressed This Visit    Tremor    Longstanding, family endorses resting component. Treating with CBD oil with benefit.  With unsteady gait, memory trouble but no significant rigidity endorsed, will refer to neuro for eval PD.       Relevant Orders   Ambulatory referral to Neurology   Recurrent falls    Has declined further physical therapy. Encouraged regular use of ambulatory assistive device.       Relevant Orders   Ambulatory referral to Neurology   Peripheral vascular disease (HCC)   Osteoporosis    Latest DEXA actually in osteopenia range (T score -2.4). However she did have recent L4 compression fracture s/p kyphoplasty.  She has completed 5 yrs  bisphosphonate. Will ask daughter to check on prolia cost with her insurance.       Memory loss    Endorsed by daughter, pt denies  significant trouble.  H/o prior strokes likely contributory.  Will refer to neurology for further evaluation. Daughter sees Lucia Gaskins       MDD (major depressive disorder), recurrent episode, moderate (HCC)    Stable period on sertraline 100mg  daily. Takes trazodone for insomnia.      Relevant Medications   traZODone (DESYREL) 100 MG tablet   sertraline (ZOLOFT) 100 MG tablet   Intertrigo    Presumed candidal - but has not responded to nystatin powder or oral diflucan x4 wks. Will Rx lotrisone. Consider treatment for erythrasma (topical clinda or erythromycin), consider derm referral.      Insomnia    Continue 100mg  trazodone nightly.       Hypothyroidism (acquired)    TSH, fT4 normal.      Hypertension    Chronic, stable. Continue current regimen.       Hyperlipidemia    Chronic, adequate. LDL not at goal. If remaining above goal, consider increased lipitor dosing.  The ASCVD Risk score Denman George DC Jr., et al., 2013) failed to calculate for the following reasons:   The patient has a prior MI or stroke diagnosis       Hyperkalemia    She has been taking potassium 40 mEq on days she takes lasix - will decrease this to PRN.       History of stroke   Hemiparesis affecting right side as late effect of stroke (HCC)    Stable period albeit several falls. Encouraged regular cane/walker use.      Ex-smoker    Continues lung cancer screening program.      Encounter for general adult medical examination with abnormal findings - Primary    Preventative protocols reviewed and updated unless pt declined. Discussed healthy diet and lifestyle.       COPD (chronic obstructive pulmonary disease) (HCC)    Stable period on advair daily and albuterol PRN.       Chronic respiratory failure with hypoxia (HCC)    She has not been using home nocturnal  oxygen. They request retesting with ONO to evaluate continued need for nocturnal oxygen. Will order.       Relevant Orders   Pulse oximetry, overnight   Chronic diastolic heart failure (HCC)    Appreciate cards care.       Carotid stenosis    Followed by IR      Advanced care planning/counseling discussion    Advanced directives discussed - continues working on living will - needs to get notarized. Would want daughter Alexia Freestone to be HCPOA.           Meds ordered this encounter  Medications  . traZODone (DESYREL) 100 MG tablet    Sig: Take 1 tablet (100 mg total) by mouth at bedtime.    Dispense:  90 tablet    Refill:  3  . sertraline (ZOLOFT) 100 MG tablet    Sig: Take 1 tablet (100 mg total) by mouth daily.    Dispense:  90 tablet    Refill:  3  . clotrimazole-betamethasone (LOTRISONE) cream    Sig: Apply 1 application topically 2 (two) times daily.    Dispense:  30 g    Refill:  0   Orders Placed This Encounter  Procedures  . Ambulatory referral to Neurology    Referral Priority:   Routine    Referral Type:   Consultation    Referral Reason:   Specialty Services Required    Requested Specialty:   Neurology  Number of Visits Requested:   1  . Pulse oximetry, overnight    Standing Status:   Future    Standing Expiration Date:   07/04/2019    Follow up plan: Return in about 6 months (around 01/01/2019) for follow up visit.  [name], MD

## 2018-07-03 DIAGNOSIS — R251 Tremor, unspecified: Secondary | ICD-10-CM | POA: Insufficient documentation

## 2018-07-03 NOTE — Assessment & Plan Note (Signed)
Stable period on sertraline 100mg  daily. Takes trazodone for insomnia.

## 2018-07-03 NOTE — Assessment & Plan Note (Signed)
Chronic, stable. Continue current regimen. 

## 2018-07-03 NOTE — Assessment & Plan Note (Signed)
Has declined further physical therapy. Encouraged regular use of ambulatory assistive device.

## 2018-07-03 NOTE — Assessment & Plan Note (Addendum)
Longstanding, family endorses resting component. Treating with CBD oil with benefit.  With unsteady gait, memory trouble but no significant rigidity endorsed, will refer to neuro for eval PD.

## 2018-07-03 NOTE — Assessment & Plan Note (Signed)
Presumed candidal - but has not responded to nystatin powder or oral diflucan x4 wks. Will Rx lotrisone. Consider treatment for erythrasma (topical clinda or erythromycin), consider derm referral.

## 2018-07-03 NOTE — Assessment & Plan Note (Signed)
Stable period on advair daily and albuterol PRN.

## 2018-07-03 NOTE — Assessment & Plan Note (Signed)
Appreciate cards care. 

## 2018-07-03 NOTE — Assessment & Plan Note (Signed)
Endorsed by daughter, pt denies significant trouble.  H/o prior strokes likely contributory.  Will refer to neurology for further evaluation. Daughter sees Lucia Gaskins

## 2018-07-03 NOTE — Assessment & Plan Note (Signed)
Chronic, adequate. LDL not at goal. If remaining above goal, consider increased lipitor dosing.  The ASCVD Risk score Denman George DC Jr., et al., 2013) failed to calculate for the following reasons:   The patient has a prior MI or stroke diagnosis

## 2018-07-03 NOTE — Assessment & Plan Note (Signed)
Latest DEXA actually in osteopenia range (T score -2.4). However she did have recent L4 compression fracture s/p kyphoplasty.  She has completed 5 yrs bisphosphonate. Will ask daughter to check on prolia cost with her insurance.

## 2018-07-03 NOTE — Assessment & Plan Note (Signed)
TSH, fT4 normal.  

## 2018-07-03 NOTE — Assessment & Plan Note (Addendum)
Continues lung cancer screening program.

## 2018-07-03 NOTE — Assessment & Plan Note (Addendum)
Stable period albeit several falls. Encouraged regular cane/walker use.

## 2018-07-03 NOTE — Assessment & Plan Note (Signed)
Continue 100mg  trazodone nightly.

## 2018-07-03 NOTE — Assessment & Plan Note (Signed)
She has been taking potassium 40 mEq on days she takes lasix - will decrease this to PRN.

## 2018-07-03 NOTE — Assessment & Plan Note (Signed)
She has not been using home nocturnal oxygen. They request retesting with ONO to evaluate continued need for nocturnal oxygen. Will order.

## 2018-07-03 NOTE — Assessment & Plan Note (Signed)
Followed by IR.  

## 2018-07-05 ENCOUNTER — Other Ambulatory Visit (HOSPITAL_COMMUNITY): Payer: Self-pay | Admitting: Interventional Radiology

## 2018-07-05 DIAGNOSIS — I771 Stricture of artery: Secondary | ICD-10-CM

## 2018-07-17 DIAGNOSIS — J449 Chronic obstructive pulmonary disease, unspecified: Secondary | ICD-10-CM | POA: Diagnosis not present

## 2018-07-17 DIAGNOSIS — R269 Unspecified abnormalities of gait and mobility: Secondary | ICD-10-CM | POA: Diagnosis not present

## 2018-07-18 ENCOUNTER — Ambulatory Visit (HOSPITAL_COMMUNITY)
Admission: RE | Admit: 2018-07-18 | Discharge: 2018-07-18 | Disposition: A | Discharge status: Home / Self Care | Payer: Medicare HMO | Source: Ambulatory Visit | Attending: Interventional Radiology | Admitting: Interventional Radiology

## 2018-07-18 ENCOUNTER — Ambulatory Visit (HOSPITAL_COMMUNITY): Payer: Medicare HMO

## 2018-07-18 DIAGNOSIS — J439 Emphysema, unspecified: Secondary | ICD-10-CM | POA: Insufficient documentation

## 2018-07-18 DIAGNOSIS — I672 Cerebral atherosclerosis: Secondary | ICD-10-CM | POA: Insufficient documentation

## 2018-07-18 DIAGNOSIS — I6523 Occlusion and stenosis of bilateral carotid arteries: Secondary | ICD-10-CM | POA: Insufficient documentation

## 2018-07-18 DIAGNOSIS — I771 Stricture of artery: Secondary | ICD-10-CM | POA: Diagnosis not present

## 2018-07-18 DIAGNOSIS — I700 Atherosclerosis of aorta: Secondary | ICD-10-CM | POA: Insufficient documentation

## 2018-07-18 MED ORDER — IOPAMIDOL (ISOVUE-370) INJECTION 76%
50.0000 mL | Freq: Once | INTRAVENOUS | Status: AC | PRN
  Administered 2018-07-18: 50 mL via INTRAVENOUS

## 2018-07-18 MED ORDER — IOPAMIDOL (ISOVUE-370) INJECTION 76%
INTRAVENOUS | Status: AC
  Filled 2018-07-18: qty 50

## 2018-07-20 ENCOUNTER — Other Ambulatory Visit (HOSPITAL_COMMUNITY): Payer: Self-pay | Admitting: Interventional Radiology

## 2018-07-20 DIAGNOSIS — I771 Stricture of artery: Secondary | ICD-10-CM

## 2018-07-24 NOTE — Progress Notes (Deleted)
[facility] Pulmonary Medicine Consultation      Assessment and Plan:  COPD/emphysema, group D.  -We will start Advair inhaler. -Encouraged increased physical activity.  Chronic hypoxic respiratory failure.  --Patient is being maintained on nocturnal oxygen at 1L. Daytime oxygen was discontinued at last visit.   No orders of the defined types were placed in this encounter.  No orders of the defined types were placed in this encounter.  No follow-ups on file.    Date: 07/24/2018  MRN# [id number] TAYDEN DURAN 09/25/1946    LAQUANNA VEAZEY is a 72 y.o. old female seen in consultation for chief complaint of:    No chief complaint on file.   HPI:   Patient is a 72 year old female smoker with COPD with history of hospitalization for AECOPD. At last visit she was asked to continue advair and increase her physical activity. She was referred to pulm rehab and a PFT.     She was recently discharged from the hospital on 11/16/17 after 2-day admission for acute hypoxic respiratory failure, likely COPD exacerbation complicating her recent kyphoplasty.  She was ambulated in the hospital and found her oxygen saturation dropped to 80% on room air, therefore discharged on ambulatory oxygen.   Since getting out she has continued to use her oxygen at 2L at all times including sleep. She feels that her breathing is doing ok. She feels that her breathing is better since being on the oxygen. She stopped smoking 5 yrs ago, up to 2 ppd.  She is currently on no inhalers, she has not been on any recently. She used to work in a nursing home.  She has a dog all over house, sleeps in her bed.  She has sinus drainage which has been worse with the oxygen.  Denies reflux.   **Desat walk at rest on RA, sat is 96% and HR 91walked 180 feet slow pace, mild dyspnea, sat was 89%, HR 111. After sitting for 30 sec, sat came back up to 94%.  **Imaging personally reviewed, CT chest 11/14/17; moderate  emphysema strandy bibasilar atelectasis, obesity. **Enrolled in [place] lung cancer screening at [facility].   Social Hx:   Social History   Tobacco Use  . Smoking status: Former Smoker    Packs/day: 2.00    Years: 46.00    Pack years: 92.00    Types: Cigarettes    Last attempt to quit: 11/08/2012    Years since quitting: 5.7  . Smokeless tobacco: Never Used  Substance Use Topics  . Alcohol use: No    Alcohol/week: 0.0 standard drinks  . Drug use: No   Medication:    Current Outpatient Medications:  .  acetaminophen (TYLENOL) 500 MG tablet, Take 500 mg by mouth 2 (two) times daily as needed for mild pain. , Disp: , Rfl:  .  albuterol (PROVENTIL HFA;VENTOLIN HFA) 108 (90 Base) MCG/ACT inhaler, Inhale 2 puffs into the lungs every 6 (six) hours as needed for wheezing or shortness of breath., Disp: 1 Inhaler, Rfl: 2 .  alendronate (FOSAMAX) 70 MG tablet, TAKE 1 TABLET WEEKLY  WITH A FULL GLASS OF WATER ON AN EMPTY STOMACH, Disp: 12 tablet, Rfl: 3 .  aspirin EC 81 MG tablet, Take 81 mg by mouth daily., Disp: , Rfl:  .  atorvastatin (LIPITOR) 40 MG tablet, TAKE 1 TABLET (40 MG TOTAL) BY MOUTH DAILY., Disp: 90 tablet, Rfl: 2 .  benazepril (LOTENSIN) 5 MG tablet, Take 1 tablet (5 mg total) by mouth daily., Disp:  30 tablet, Rfl: 3 .  Calcium Carb-Cholecalciferol (CALCIUM-VITAMIN D) 600-400 MG-UNIT TABS, Take 1 tablet by mouth daily., Disp: , Rfl:  .  Cholecalciferol (VITAMIN D PO), Take 1 capsule by mouth daily., Disp: , Rfl:  .  clopidogrel (PLAVIX) 75 MG tablet, TAKE 1 TABLET EVERY DAY, Disp: 90 tablet, Rfl: 1 .  clotrimazole-betamethasone (LOTRISONE) cream, Apply 1 application topically 2 (two) times daily., Disp: 30 g, Rfl: 0 .  fexofenadine (ALLEGRA) 180 MG tablet, Take 180 mg by mouth daily as needed for allergies. , Disp: , Rfl:  .  fluconazole (DIFLUCAN) 150 MG tablet, Take 1 tablet (150 mg total) by mouth once a week., Disp: 2 tablet, Rfl: 0 .  fluticasone-salmeterol (ADVAIR HFA) 115-21 MCG/ACT  inhaler, Inhale 2 puffs into the lungs 2 (two) times daily. Rinse mouth after use., Disp: 1 Inhaler, Rfl: 12 .  furosemide (LASIX) 20 MG tablet, Take 1 tablet (20 mg total) by mouth as needed for fluid or edema. May take once or twice a week for swelling, Disp: 40 tablet, Rfl: 1 .  levothyroxine (SYNTHROID, LEVOTHROID) 50 MCG tablet, Take 1 tablet (50 mcg total) by mouth daily before breakfast., Disp: 90 tablet, Rfl: 1 .  nystatin (MYCOSTATIN/NYSTOP) powder, Apply topically 3 (three) times daily., Disp: 30 g, Rfl: 0 .  Potassium Chloride ER 20 MEQ TBCR, Take 20 mEq by mouth as needed (whenever you take lasix)., Disp: , Rfl:  .  sertraline (ZOLOFT) 100 MG tablet, Take 1 tablet (100 mg total) by mouth daily., Disp: 90 tablet, Rfl: 3 .  traMADol (ULTRAM) 50 MG tablet, Take 1-2 tablets (50-100 mg total) by mouth 3 (three) times daily as needed for moderate pain., Disp: 30 tablet, Rfl: 0 .  traZODone (DESYREL) 100 MG tablet, Take 1 tablet (100 mg total) by mouth at bedtime., Disp: 90 tablet, Rfl: 3  Current Facility-Administered Medications:  .  0.9 %  sodium chloride infusion, 500 mL, Intravenous, Continuous, Danis, Starr Lake III, MD   Allergies:  Doxycycline      LABORATORY PANEL:   CBC No results for input(s): WBC, HGB, HCT, PLT in the last 168 hours. ------------------------------------------------------------------------------------------------------------------  Chemistries  No results for input(s): NA, K, CL, CO2, GLUCOSE, BUN, CREATININE, CALCIUM, MG, AST, ALT, ALKPHOS, BILITOT in the last 168 hours.  Invalid input(s): GFRCGP ------------------------------------------------------------------------------------------------------------------  Cardiac Enzymes No results for input(s): TROPONINI in the last 168 hours. ------------------------------------------------------------  RADIOLOGY:  No results found.     Thank  you for the consultation and for allowing [facility] [place]  Pulmonary, Critical Care to assist in the care of your patient. Our recommendations are noted above.  Please contact us if we can be of further service.  Wells Guiles, M.D., F.C.C.P.  Board Certified in Internal Medicine, Pulmonary Medicine, Critical Care Medicine, and Sleep Medicine.  [place] [facility] Office Number: [phone]   07/24/2018

## 2018-07-27 ENCOUNTER — Ambulatory Visit (HOSPITAL_COMMUNITY)
Admission: RE | Admit: 2018-07-27 | Discharge: 2018-07-27 | Disposition: A | Discharge status: Home / Self Care | Payer: Medicare HMO | Source: Ambulatory Visit | Attending: Interventional Radiology | Admitting: Interventional Radiology

## 2018-07-27 ENCOUNTER — Ambulatory Visit: Payer: Medicare HMO | Admitting: Internal Medicine

## 2018-07-27 DIAGNOSIS — I771 Stricture of artery: Secondary | ICD-10-CM

## 2018-07-27 DIAGNOSIS — I6522 Occlusion and stenosis of left carotid artery: Secondary | ICD-10-CM | POA: Diagnosis not present

## 2018-07-27 NOTE — Progress Notes (Signed)
Chief Complaint: The patient is seen in follow up today for high-grade stenosis of the left internal carotid artery cavernous segment  History of present illness: [name] [name] is a 72 year old female with past medical history of anxiety, depression, carotid stenosis, COPD, CHF, HTN, HLD, and stroke who is followed by Dr. Corliss Skains for high-grade stenosis of the left internal carotid artery cavernous segment. She returns to [facility] clinic today after repeat imaging with CTA Head and Neck to monitor her stenosis.  She presents to clinic today in her usual state of health.  She is accompanied by her daughter.  She now lives with her daughter due to limited ADLs and self care abilities.  Patient and daughter report increased confusion and forgetfulness at home.  She is being evaluated by Neurology for possible Parkinson's disease. Otherwise, she is stable from her remote stroke.  She remains asymptomatic of her stenosis.  Denies headaches, blurry vision, dilopia, or blindness, no weakness, swallowing difficulties, or numbness.   Past Medical History:  Diagnosis Date  . Aneurysm (HCC)   . Anxiety   . Anxiety and depression   . Carotid stenosis    R 50% (12/2012)  . Concussion 08/03/2015  . COPD (chronic obstructive pulmonary disease) (HCC) 12/2012   spirometry: Pre: FVC 84%, FEV1 69%, ratio 0.64 consistent with moderate obstruction.  . Depression   . Fall 08/03/2015   d/c home health 08/2015  . Fracture of cervical vertebra, C5 (HCC) 08/06/2015  . History of chicken pox   . Hyperlipidemia   . Hypertension   . Lower back pain    h/o HNP s/p surgery  . Osteoarthritis    h/o ruptured disc s/p ESI  . Osteoporosis 11/2010   DEXA -2.7 spine, thoracic compression fracture  . Peripheral vascular disease (HCC)   . Smoker    quit 10/2012  . Stroke [facility]) 2010   x3 with residual R hemiparesis, s/p R MCA balloon angioplasty (2010)    Past Surgical History:  Procedure Laterality Date  . APPENDECTOMY   1960  . CATARACT EXTRACTION     bilateral  . CESAREAN SECTION    . CHOLECYSTECTOMY  1970  . COLONOSCOPY  2004   diverticulosis, no polyps Jarold Motto)  . COLONOSCOPY  05/2016   decreased sphincter tone, diverticulosis, no f/u recommended (Danis)  . DEXA  11/2010   T -2.7 spine, -1.9 hip  . HIP SURGERY Left 2006   fractured - screws placed  . IR RADIOLOGIST EVAL & MGMT  01/09/2018  . KYPHOPLASTY  10/02/2017   Procedure: LUMBAR FOUR KYPHOPLASTY;  Surgeon: Coletta Memos, MD;  Location: [facility] OR;  Service: Neurosurgery;;  . RADIOLOGY WITH ANESTHESIA N/A 11/11/2015   Procedure: RADIOLOGY WITH ANESTHESIA;  Surgeon: Julieanne Cotton, MD;  Location: [facility] OR;  Service: Radiology;  Laterality: N/A;    Allergies: Doxycycline  Medications: Prior to Admission medications   Medication Sig Start Date End Date Taking? Authorizing Provider  acetaminophen (TYLENOL) 500 MG tablet Take 500 mg by mouth 2 (two) times daily as needed for mild pain.     [provider]  albuterol (PROVENTIL HFA;VENTOLIN HFA) 108 (90 Base) MCG/ACT inhaler Inhale 2 puffs into the lungs every 6 (six) hours as needed for wheezing or shortness of breath. 11/21/17   Shane Crutch, MD  alendronate (FOSAMAX) 70 MG tablet TAKE 1 TABLET WEEKLY  WITH A FULL GLASS OF WATER ON AN EMPTY STOMACH 10/11/16   [name], MD  aspirin EC 81 MG tablet Take 81  mg by mouth daily.    [provider]  atorvastatin (LIPITOR) 40 MG tablet TAKE 1 TABLET (40 MG TOTAL) BY MOUTH DAILY. 12/28/17   [name], MD  benazepril (LOTENSIN) 5 MG tablet Take 1 tablet (5 mg total) by mouth daily. 11/20/17   [name], MD  Calcium Carb-Cholecalciferol (CALCIUM-VITAMIN D) 600-400 MG-UNIT TABS Take 1 tablet by mouth daily.    [provider]  Cholecalciferol (VITAMIN D PO) Take 1 capsule by mouth daily.    [provider]  clopidogrel (PLAVIX) 75 MG tablet TAKE 1 TABLET EVERY DAY 05/24/18   [name], MD    clotrimazole-betamethasone (LOTRISONE) cream Apply 1 application topically 2 (two) times daily. 07/02/18   [name], MD  fexofenadine (ALLEGRA) 180 MG tablet Take 180 mg by mouth daily as needed for allergies.     [provider]  fluconazole (DIFLUCAN) 150 MG tablet Take 1 tablet (150 mg total) by mouth once a week. 06/24/18   [name], MD  fluticasone-salmeterol (ADVAIR HFA) [phone] MCG/ACT inhaler Inhale 2 puffs into the lungs 2 (two) times daily. Rinse mouth after use. 11/21/17   Shane Crutch, MD  furosemide (LASIX) 20 MG tablet Take 1 tablet (20 mg total) by mouth as needed for fluid or edema. May take once or twice a week for swelling 05/07/18   [name], MD  levothyroxine (SYNTHROID, LEVOTHROID) 50 MCG tablet Take 1 tablet (50 mcg total) by mouth daily before breakfast. 05/19/18   [name], MD  nystatin (MYCOSTATIN/NYSTOP) powder Apply topically 3 (three) times daily. 06/24/18   [name], MD  Potassium Chloride ER 20 MEQ TBCR Take 20 mEq by mouth as needed (whenever you take lasix). 07/02/18   [name], MD  sertraline (ZOLOFT) 100 MG tablet Take 1 tablet (100 mg total) by mouth daily. 07/02/18   [name], MD  traMADol (ULTRAM) 50 MG tablet Take 1-2 tablets (50-100 mg total) by mouth 3 (three) times daily as needed for moderate pain. 05/07/18   [name], MD  traZODone (DESYREL) 100 MG tablet Take 1 tablet (100 mg total) by mouth at bedtime. 07/02/18   [name], MD     Family History  Problem Relation Age of Onset  . CAD Mother        MI  . Cancer Mother        cervical  . CAD Maternal Aunt        MI  . CAD Sister        MI  . Sudden death Father 65       died in his sleep, chain smoker  . Cirrhosis Father   . Hypertension Daughter   . Diabetes Maternal Aunt   . Cancer Maternal Grandmother        cervical    Social History   Socioeconomic History  . Marital status: Divorced    Spouse  name: Not on file  . Number of children: Not on file  . Years of education: Not on file  . Highest education level: Not on file  Occupational History  . Not on file  Social Needs  . Financial resource strain: Not on file  . Food insecurity:    Worry: Not on file    Inability: Not on file  . Transportation needs:    Medical: Not on file    Non-medical: Not on file  Tobacco Use  . Smoking status: Former Smoker    Packs/day: 2.00    Years: 46.00  Pack years: 92.00    Types: Cigarettes    Last attempt to quit: 11/08/2012    Years since quitting: 5.7  . Smokeless tobacco: Never Used  Substance and Sexual Activity  . Alcohol use: No    Alcohol/week: 0.0 standard drinks  . Drug use: No  . Sexual activity: Not Currently  Lifestyle  . Physical activity:    Days per week: Not on file    Minutes per session: Not on file  . Stress: Not on file  Relationships  . Social connections:    Talks on phone: Not on file    Gets together: Not on file    Attends religious service: Not on file    Active member of club or organization: Not on file    Attends meetings of clubs or organizations: Not on file    Relationship status: Not on file  Other Topics Concern  . Not on file  Social History Narrative   Lives with granddaughter and great grandchildren   Occupation: retired, was CNA   Edu: 8th grade   Activity: no regular activity:   Diet: some water, fruits/vegetables daily     Vital Signs: There were no vitals taken for this visit.  Physical Exam  NAD, sitting in wheelchair Lungs/Chest: no distress, no increased work of breathing Neuro: No focal deficits noted, hand tremor at baseline  Imaging: No results found.  Labs:  CBC: Recent Labs    11/15/17 0633 11/16/17 0450 01/30/18 1006 05/07/18 1434  WBC 5.7 5.8 7.6 5.9  HGB 12.1 12.1 12.6 12.8  HCT 38.6 38.3 37.7 38.2  PLT 185 183 161.0 171.0    COAGS: No results for input(s): INR, APTT in the last 8760  hours.  BMP: Recent Labs    11/13/17 1657 11/14/17 0532 11/15/17 1610 11/16/17 0450 01/30/18 1006 02/02/18 1044 05/07/18 1434 06/27/18 1042  NA 136  --  139 138 136  --  136 139  K 4.1  --  4.3 4.3 5.8* 4.8 4.8 5.2*  CL 104  --  102 103 102  --  101 103  CO2 21*  --  26 24 29   --  30 29  GLUCOSE 100*  --  110* 103* 82  --  97 94  BUN 20  --  18 17 16   --  26* 21  CALCIUM 9.5  --  9.2 9.1 10.4  --  9.9 9.9  CREATININE 1.12* 1.00 0.89 0.87 0.86  --  1.06 1.09  GFRNONAA 48* 55* >60 >60  --   --   --   --   GFRAA 56* >60 >60 >60  --   --   --   --     LIVER FUNCTION TESTS: Recent Labs    01/30/18 1006  BILITOT 0.4  AST 21  ALT 11  ALKPHOS 100  PROT 7.6  ALBUMIN 4.1    Assessment: High-grade stenosis of the left internal carotid artery cavernous segment Patient and daughter present to [facility] clinic today for follow-up imaging and discussion regarding her ICA stenosis.  Imaging reviewed by Dr. Corliss Skains with patient and daughter.  Given the patient status, medical comorbidities, and largely stable neuro imaging findings, it was decided to continue with conservative management at this time.  She will return for repeat CTA in 6 months.   She understands to seek emergent treatment if she should develop acute onset of stroke-like symptoms.  Patient and daughter verbalize understanding and agreement of care plan.  Signed: Hoyt Koch, PA 07/27/2018, 4:10 PM   I spent a total of25 Minutesin face to face in clinical consultation, greater than 50% of which was counseling/coordinating care for ICA stenosis.

## 2018-07-29 NOTE — Progress Notes (Signed)
[name]      Assessment and Plan:  COPD/Emphysema group D.  -History of hospitalization for exacerbation. - Continue Advair. -Again encouraged increased physical activity. --PCV 13 01/01/14, PPSV 23 11/26/12. Flu shot this year.  --Is enrolled in lung cancer screening.   Chronic hypoxic respiratory failure.  --Patient is being maintained on nocturnal oxygen at 1 L, however she is not currently using it.  She is interested in discontinuing it if it is not needed. - We will order overnight oximetry, if negative can discontinue oxygen.   Orders Placed This Encounter  Procedures  . Pulse oximetry, overnight    Return in about 6 months (around 01/28/2019).    Date: 07/29/2018  MRN# [id number] [name] [name] [birth year]    [name] [name] is a 72 y.o. old female seen in consultation for chief complaint of:    Chief Complaint  Patient presents with  . Emphysema    pt states breathing doing well on Advair. She is not using resuce inhaler.    HPI:   Patient is a 72 year old female smoker with COPD with history of hospitalization for AECOPD. At last visit she was asked to continue advair and increase her physical activity. She was referred to pulm rehab and a PFT. She was  discharged from the hospital on 11/16/17 after 2-day admission for acute hypoxic respiratory failure, likely COPD exacerbation complicating her recent kyphoplasty.  She has been using advair twice daily, she did not have the lung function test. She did not go to pulmonary rehab.  She feels that her breathing has been doing well, she gets dyspnea occasionally when she exerts herself such as walking the dog.  She has oxygen at night at 1L but she has not been using it.      She was ambulated in the hospital and found her oxygen saturation dropped to 80% on room air, therefore discharged on ambulatory oxygen.   Since getting out she has continued to use her oxygen at 2L at all  times including sleep. She feels that her breathing is doing ok. She feels that her breathing is better since being on the oxygen. She stopped smoking 5 yrs ago, up to 2 ppd.  She is currently on no inhalers, she has not been on any recently. She used to work in a nursing home.  She has a dog all over house, sleeps in her bed.  She has sinus drainage which has been worse with the oxygen.  Denies reflux.   **Desat walk at rest on RA, sat is 96% and HR 91walked 180 feet slow pace, mild dyspnea, sat was 89%, HR 111. After sitting for 30 sec, sat came back up to 94%.  **Imaging personally reviewed, CT chest 11/14/17; moderate emphysema strandy bibasilar atelectasis, obesity. **Enrolled in [place] lung cancer screening at [facility].   Social Hx:   Social History   Tobacco Use  . Smoking status: Former Smoker    Packs/day: 2.00    Years: 46.00    Pack years: 92.00    Types: Cigarettes    Last attempt to quit: 11/08/2012    Years since quitting: 5.7  . Smokeless tobacco: Never Used  Substance Use Topics  . Alcohol use: No    Alcohol/week: 0.0 standard drinks  . Drug use: No   Medication:    Current Outpatient Medications:  .  acetaminophen (TYLENOL) 500 MG tablet, Take 500 mg by mouth 2 (two) times daily as needed for mild pain. ,  Disp: , Rfl:  .  albuterol (PROVENTIL HFA;VENTOLIN HFA) 108 (90 Base) MCG/ACT inhaler, Inhale 2 puffs into the lungs every 6 (six) hours as needed for wheezing or shortness of breath., Disp: 1 Inhaler, Rfl: 2 .  alendronate (FOSAMAX) 70 MG tablet, TAKE 1 TABLET WEEKLY  WITH A FULL GLASS OF WATER ON AN EMPTY STOMACH, Disp: 12 tablet, Rfl: 3 .  aspirin EC 81 MG tablet, Take 81 mg by mouth daily., Disp: , Rfl:  .  atorvastatin (LIPITOR) 40 MG tablet, TAKE 1 TABLET (40 MG TOTAL) BY MOUTH DAILY., Disp: 90 tablet, Rfl: 2 .  benazepril (LOTENSIN) 5 MG tablet, Take 1 tablet (5 mg total) by mouth daily., Disp: 30 tablet, Rfl: 3 .  Calcium Carb-Cholecalciferol (CALCIUM-VITAMIN D)  600-400 MG-UNIT TABS, Take 1 tablet by mouth daily., Disp: , Rfl:  .  Cholecalciferol (VITAMIN D PO), Take 1 capsule by mouth daily., Disp: , Rfl:  .  clopidogrel (PLAVIX) 75 MG tablet, TAKE 1 TABLET EVERY DAY, Disp: 90 tablet, Rfl: 1 .  clotrimazole-betamethasone (LOTRISONE) cream, Apply 1 application topically 2 (two) times daily., Disp: 30 g, Rfl: 0 .  fexofenadine (ALLEGRA) 180 MG tablet, Take 180 mg by mouth daily as needed for allergies. , Disp: , Rfl:  .  fluconazole (DIFLUCAN) 150 MG tablet, Take 1 tablet (150 mg total) by mouth once a week., Disp: 2 tablet, Rfl: 0 .  fluticasone-salmeterol (ADVAIR HFA) 115-21 MCG/ACT inhaler, Inhale 2 puffs into the lungs 2 (two) times daily. Rinse mouth after use., Disp: 1 Inhaler, Rfl: 12 .  furosemide (LASIX) 20 MG tablet, Take 1 tablet (20 mg total) by mouth as needed for fluid or edema. May take once or twice a week for swelling, Disp: 40 tablet, Rfl: 1 .  levothyroxine (SYNTHROID, LEVOTHROID) 50 MCG tablet, Take 1 tablet (50 mcg total) by mouth daily before breakfast., Disp: 90 tablet, Rfl: 1 .  nystatin (MYCOSTATIN/NYSTOP) powder, Apply topically 3 (three) times daily., Disp: 30 g, Rfl: 0 .  Potassium Chloride ER 20 MEQ TBCR, Take 20 mEq by mouth as needed (whenever you take lasix)., Disp: , Rfl:  .  sertraline (ZOLOFT) 100 MG tablet, Take 1 tablet (100 mg total) by mouth daily., Disp: 90 tablet, Rfl: 3 .  traMADol (ULTRAM) 50 MG tablet, Take 1-2 tablets (50-100 mg total) by mouth 3 (three) times daily as needed for moderate pain., Disp: 30 tablet, Rfl: 0 .  traZODone (DESYREL) 100 MG tablet, Take 1 tablet (100 mg total) by mouth at bedtime., Disp: 90 tablet, Rfl: 3  Current Facility-Administered Medications:  .  0.9 %  sodium chloride infusion, 500 mL, Intravenous, Continuous, Danis, Starr Lake III, MD   Allergies:  Doxycycline  Review of Systems:  Constitutional: Feels well. Cardiovascular: Denies chest pain, exertional chest pain.  Pulmonary:  Denies hemoptysis, pleuritic chest pain.   The remainder of systems were reviewed and were found to be negative other than what is documented in the HPI.    Physical Examination:   VS: BP 130/66 (BP Location: Left Arm, Cuff Size: Normal)   Pulse 88   Resp 16   Ht 5' 1.5" (1.562 m)   Wt 156 lb (70.8 kg)   SpO2 95%   BMI 29.00 kg/m   General Appearance: No distress  Neuro:without focal findings, mental status, speech normal, alert and oriented HEENT: PERRLA, EOM intact Pulmonary: No wheezing, No rales  CardiovascularNormal S1,S2.  No m/r/g.  Abdomen: Benign, Soft, non-tender, No masses Renal:  No costovertebral  tenderness  GU:  No performed at this time. Endoc: No evident thyromegaly, no signs of acromegaly or Cushing features Skin:   warm, no rashes, no ecchymosis  Extremities: normal, no cyanosis, clubbing.      LABORATORY PANEL:   CBC No results for input(s): WBC, HGB, HCT, PLT in the last 168 hours. ------------------------------------------------------------------------------------------------------------------  Chemistries  No results for input(s): NA, K, CL, CO2, GLUCOSE, BUN, CREATININE, CALCIUM, MG, AST, ALT, ALKPHOS, BILITOT in the last 168 hours.  Invalid input(s): GFRCGP ------------------------------------------------------------------------------------------------------------------  Cardiac Enzymes No results for input(s): TROPONINI in the last 168 hours. ------------------------------------------------------------  RADIOLOGY:  No results found.     Thank  you for the consultation and for allowing [facility] to assist in the care of your patient. Our recommendations are noted above.  Please contact us if we can be of further service.  Wells Guiles, M.D., F.C.C.P.  Board Certified in Internal Medicine, Pulmonary Medicine, Critical Care Medicine, and Sleep Medicine.  [place] [facility] Office Number:  [phone]   07/29/2018

## 2018-07-29 NOTE — Progress Notes (Signed)
I reviewed health advisor's note, was available for consultation, and agree with documentation and plan.  

## 2018-07-30 ENCOUNTER — Ambulatory Visit (INDEPENDENT_AMBULATORY_CARE_PROVIDER_SITE_OTHER): Payer: 59 | Admitting: Internal Medicine

## 2018-07-30 ENCOUNTER — Other Ambulatory Visit: Payer: Self-pay

## 2018-07-30 ENCOUNTER — Encounter: Payer: Self-pay | Admitting: Internal Medicine

## 2018-07-30 ENCOUNTER — Observation Stay (HOSPITAL_COMMUNITY)
Admission: EM | Admit: 2018-07-30 | Discharge: 2018-08-02 | Disposition: A | Discharge status: Home / Self Care | Payer: Medicare HMO | Attending: Internal Medicine | Admitting: Internal Medicine

## 2018-07-30 ENCOUNTER — Encounter (HOSPITAL_COMMUNITY): Payer: Self-pay | Admitting: Emergency Medicine

## 2018-07-30 ENCOUNTER — Emergency Department (HOSPITAL_COMMUNITY): Payer: Medicare HMO

## 2018-07-30 VITALS — BP 130/66 | HR 88 | Resp 16 | Ht 61.5 in | Wt 156.0 lb

## 2018-07-30 DIAGNOSIS — Z981 Arthrodesis status: Secondary | ICD-10-CM | POA: Insufficient documentation

## 2018-07-30 DIAGNOSIS — G9341 Metabolic encephalopathy: Secondary | ICD-10-CM | POA: Diagnosis not present

## 2018-07-30 DIAGNOSIS — Z87891 Personal history of nicotine dependence: Secondary | ICD-10-CM | POA: Insufficient documentation

## 2018-07-30 DIAGNOSIS — I110 Hypertensive heart disease with heart failure: Secondary | ICD-10-CM | POA: Insufficient documentation

## 2018-07-30 DIAGNOSIS — R29810 Facial weakness: Secondary | ICD-10-CM | POA: Insufficient documentation

## 2018-07-30 DIAGNOSIS — J449 Chronic obstructive pulmonary disease, unspecified: Secondary | ICD-10-CM | POA: Insufficient documentation

## 2018-07-30 DIAGNOSIS — H53132 Sudden visual loss, left eye: Secondary | ICD-10-CM | POA: Diagnosis not present

## 2018-07-30 DIAGNOSIS — R41841 Cognitive communication deficit: Secondary | ICD-10-CM | POA: Diagnosis not present

## 2018-07-30 DIAGNOSIS — I061 Rheumatic aortic insufficiency: Secondary | ICD-10-CM | POA: Insufficient documentation

## 2018-07-30 DIAGNOSIS — L304 Erythema intertrigo: Secondary | ICD-10-CM | POA: Diagnosis not present

## 2018-07-30 DIAGNOSIS — H1132 Conjunctival hemorrhage, left eye: Secondary | ICD-10-CM | POA: Insufficient documentation

## 2018-07-30 DIAGNOSIS — Z79890 Hormone replacement therapy (postmenopausal): Secondary | ICD-10-CM | POA: Insufficient documentation

## 2018-07-30 DIAGNOSIS — I639 Cerebral infarction, unspecified: Secondary | ICD-10-CM

## 2018-07-30 DIAGNOSIS — G319 Degenerative disease of nervous system, unspecified: Secondary | ICD-10-CM | POA: Diagnosis not present

## 2018-07-30 DIAGNOSIS — H5462 Unqualified visual loss, left eye, normal vision right eye: Secondary | ICD-10-CM

## 2018-07-30 DIAGNOSIS — I69351 Hemiplegia and hemiparesis following cerebral infarction affecting right dominant side: Secondary | ICD-10-CM | POA: Insufficient documentation

## 2018-07-30 DIAGNOSIS — D696 Thrombocytopenia, unspecified: Secondary | ICD-10-CM | POA: Diagnosis not present

## 2018-07-30 DIAGNOSIS — I5032 Chronic diastolic (congestive) heart failure: Secondary | ICD-10-CM | POA: Diagnosis not present

## 2018-07-30 DIAGNOSIS — M6281 Muscle weakness (generalized): Secondary | ICD-10-CM | POA: Diagnosis not present

## 2018-07-30 DIAGNOSIS — E039 Hypothyroidism, unspecified: Secondary | ICD-10-CM | POA: Diagnosis not present

## 2018-07-30 DIAGNOSIS — Z881 Allergy status to other antibiotic agents status: Secondary | ICD-10-CM | POA: Insufficient documentation

## 2018-07-30 DIAGNOSIS — E785 Hyperlipidemia, unspecified: Secondary | ICD-10-CM | POA: Insufficient documentation

## 2018-07-30 DIAGNOSIS — F331 Major depressive disorder, recurrent, moderate: Secondary | ICD-10-CM | POA: Diagnosis not present

## 2018-07-30 DIAGNOSIS — R200 Anesthesia of skin: Secondary | ICD-10-CM

## 2018-07-30 DIAGNOSIS — Z9841 Cataract extraction status, right eye: Secondary | ICD-10-CM | POA: Insufficient documentation

## 2018-07-30 DIAGNOSIS — I10 Essential (primary) hypertension: Secondary | ICD-10-CM

## 2018-07-30 DIAGNOSIS — I739 Peripheral vascular disease, unspecified: Secondary | ICD-10-CM | POA: Diagnosis not present

## 2018-07-30 DIAGNOSIS — Z8679 Personal history of other diseases of the circulatory system: Secondary | ICD-10-CM | POA: Insufficient documentation

## 2018-07-30 DIAGNOSIS — I6782 Cerebral ischemia: Secondary | ICD-10-CM | POA: Diagnosis not present

## 2018-07-30 DIAGNOSIS — Z8249 Family history of ischemic heart disease and other diseases of the circulatory system: Secondary | ICD-10-CM | POA: Insufficient documentation

## 2018-07-30 DIAGNOSIS — Z9842 Cataract extraction status, left eye: Secondary | ICD-10-CM | POA: Insufficient documentation

## 2018-07-30 DIAGNOSIS — R195 Other fecal abnormalities: Secondary | ICD-10-CM

## 2018-07-30 DIAGNOSIS — Z79899 Other long term (current) drug therapy: Secondary | ICD-10-CM | POA: Insufficient documentation

## 2018-07-30 DIAGNOSIS — I6523 Occlusion and stenosis of bilateral carotid arteries: Secondary | ICD-10-CM | POA: Diagnosis not present

## 2018-07-30 DIAGNOSIS — Z9981 Dependence on supplemental oxygen: Secondary | ICD-10-CM | POA: Insufficient documentation

## 2018-07-30 DIAGNOSIS — R2681 Unsteadiness on feet: Secondary | ICD-10-CM | POA: Insufficient documentation

## 2018-07-30 DIAGNOSIS — M1990 Unspecified osteoarthritis, unspecified site: Secondary | ICD-10-CM | POA: Insufficient documentation

## 2018-07-30 DIAGNOSIS — Z9049 Acquired absence of other specified parts of digestive tract: Secondary | ICD-10-CM | POA: Insufficient documentation

## 2018-07-30 DIAGNOSIS — Z9889 Other specified postprocedural states: Secondary | ICD-10-CM | POA: Insufficient documentation

## 2018-07-30 DIAGNOSIS — R29818 Other symptoms and signs involving the nervous system: Secondary | ICD-10-CM | POA: Diagnosis not present

## 2018-07-30 DIAGNOSIS — R2990 Unspecified symptoms and signs involving the nervous system: Secondary | ICD-10-CM | POA: Diagnosis present

## 2018-07-30 DIAGNOSIS — Z7902 Long term (current) use of antithrombotics/antiplatelets: Secondary | ICD-10-CM | POA: Insufficient documentation

## 2018-07-30 DIAGNOSIS — I420 Dilated cardiomyopathy: Secondary | ICD-10-CM | POA: Diagnosis not present

## 2018-07-30 DIAGNOSIS — Z8619 Personal history of other infectious and parasitic diseases: Secondary | ICD-10-CM | POA: Insufficient documentation

## 2018-07-30 LAB — I-STAT CHEM 8, ED
BUN: 21 mg/dL (ref 8–23)
Calcium, Ion: 1.11 mmol/L — ABNORMAL LOW (ref 1.15–1.40)
Chloride: 107 mmol/L (ref 98–111)
Creatinine, Ser: 1 mg/dL (ref 0.44–1.00)
Glucose, Bld: 136 mg/dL — ABNORMAL HIGH (ref 70–99)
HCT: 38 % (ref 36.0–46.0)
Hemoglobin: 12.9 g/dL (ref 12.0–15.0)
Potassium: 3.9 mmol/L (ref 3.5–5.1)
Sodium: 141 mmol/L (ref 135–145)
TCO2: 24 mmol/L (ref 22–32)

## 2018-07-30 LAB — DIFFERENTIAL
Abs Immature Granulocytes: 0.01 10*3/uL (ref 0.00–0.07)
Basophils Absolute: 0.1 10*3/uL (ref 0.0–0.1)
Basophils Relative: 1 %
Eosinophils Absolute: 0.2 10*3/uL (ref 0.0–0.5)
Eosinophils Relative: 3 %
Immature Granulocytes: 0 %
Lymphocytes Relative: 28 %
Lymphs Abs: 1.6 10*3/uL (ref 0.7–4.0)
Monocytes Absolute: 0.4 10*3/uL (ref 0.1–1.0)
Monocytes Relative: 7 %
Neutro Abs: 3.7 10*3/uL (ref 1.7–7.7)
Neutrophils Relative %: 61 %

## 2018-07-30 LAB — CBC
HCT: 42.1 % (ref 36.0–46.0)
Hemoglobin: 12.9 g/dL (ref 12.0–15.0)
MCH: 29.3 pg (ref 26.0–34.0)
MCHC: 30.6 g/dL (ref 30.0–36.0)
MCV: 95.5 fL (ref 80.0–100.0)
Platelets: 118 10*3/uL — ABNORMAL LOW (ref 150–400)
RBC: 4.41 MIL/uL (ref 3.87–5.11)
RDW: 12.5 % (ref 11.5–15.5)
WBC: 5.9 10*3/uL (ref 4.0–10.5)
nRBC: 0 % (ref 0.0–0.2)

## 2018-07-30 LAB — CBG MONITORING, ED: Glucose-Capillary: 134 mg/dL — ABNORMAL HIGH (ref 70–99)

## 2018-07-30 LAB — COMPREHENSIVE METABOLIC PANEL
ALT: 11 U/L (ref 0–44)
AST: 21 U/L (ref 15–41)
Albumin: 3.6 g/dL (ref 3.5–5.0)
Alkaline Phosphatase: 106 U/L (ref 38–126)
Anion gap: 7 (ref 5–15)
BUN: 17 mg/dL (ref 8–23)
CO2: 24 mmol/L (ref 22–32)
Calcium: 8.9 mg/dL (ref 8.9–10.3)
Chloride: 107 mmol/L (ref 98–111)
Creatinine, Ser: 1.02 mg/dL — ABNORMAL HIGH (ref 0.44–1.00)
GFR calc Af Amer: >60 mL/min (ref >60)
GFR calc non Af Amer: 54 mL/min — ABNORMAL LOW (ref >60)
Glucose, Bld: 137 mg/dL — ABNORMAL HIGH (ref 70–99)
Potassium: 3.9 mmol/L (ref 3.5–5.1)
Sodium: 138 mmol/L (ref 135–145)
Total Bilirubin: 0.6 mg/dL (ref 0.3–1.2)
Total Protein: 6.9 g/dL (ref 6.5–8.1)

## 2018-07-30 LAB — I-STAT TROPONIN, ED: Troponin i, poc: 0 ng/mL (ref 0.00–0.08)

## 2018-07-30 LAB — APTT: aPTT: 27 seconds (ref 24–36)

## 2018-07-30 LAB — PROTIME-INR
INR: 1.02
Prothrombin Time: 13.3 seconds (ref 11.4–15.2)

## 2018-07-30 NOTE — Patient Instructions (Signed)
Will check overnight oxymetry to see if you still need oxygen, if not it can be discontinued.   Continue advair.

## 2018-07-30 NOTE — ED Triage Notes (Signed)
Pt arrives with family stating that she lost the vision in her L eye and began having L sided facial numbness. States she also has blood in the sclera of her L eye. LSW 2145.

## 2018-07-30 NOTE — ED Notes (Signed)
Activated code stroke @ 2221

## 2018-07-30 NOTE — ED Provider Notes (Signed)
TIME SEEN: 11:15 PM  CHIEF COMPLAINT: Left-sided facial numbness, left eye vision loss  HPI: Patient is a 72 year old female with history of COPD on oxygen at night, hypertension, hyperlipidemia, carotid stenosis, previous stroke who presents to the emergency department with left eye vision loss that started tonight, left-sided facial numbness that happened just prior to arrival.  Vision loss she describes as her vision in the left eye going completely black.  Family also noted that she had a subconjunctival hemorrhage medially.  No injury to the eye.  No vision loss or vision changes currently.  No eye pain.  Also had some left-sided cheek numbness and complains of right upper arm numbness on my examination currently.  No focal weakness.  No headache or head injury.  ROS: See HPI Constitutional: no fever  Eyes: no drainage  ENT: no runny nose   Cardiovascular:  no chest pain  Resp: no SOB  GI: no vomiting GU: no dysuria Integumentary: no rash  Allergy: no hives  Musculoskeletal: no leg swelling  Neurological: no slurred speech ROS otherwise negative  PAST MEDICAL HISTORY/PAST SURGICAL HISTORY:  Past Medical History:  Diagnosis Date  . Aneurysm (HCC)   . Anxiety   . Anxiety and depression   . Carotid stenosis    R 50% (12/2012)  . Concussion 08/03/2015  . COPD (chronic obstructive pulmonary disease) (HCC) 12/2012   spirometry: Pre: FVC 84%, FEV1 69%, ratio 0.64 consistent with moderate obstruction.  . Depression   . Fall 08/03/2015   d/c home health 08/2015  . Fracture of cervical vertebra, C5 (HCC) 08/06/2015  . History of chicken pox   . Hyperlipidemia   . Hypertension   . Lower back pain    h/o HNP s/p surgery  . Osteoarthritis    h/o ruptured disc s/p ESI  . Osteoporosis 11/2010   DEXA -2.7 spine, thoracic compression fracture  . Peripheral vascular disease (HCC)   . Smoker    quit 10/2012  . Stroke [facility]) 2010   x3 with residual R hemiparesis, s/p R MCA balloon  angioplasty (2010)    MEDICATIONS:  Prior to Admission medications   Medication Sig Start Date End Date Taking? Authorizing Provider  acetaminophen (TYLENOL) 500 MG tablet Take 500 mg by mouth 2 (two) times daily as needed for mild pain.    Yes [provider]  albuterol (PROVENTIL HFA;VENTOLIN HFA) 108 (90 Base) MCG/ACT inhaler Inhale 2 puffs into the lungs every 6 (six) hours as needed for wheezing or shortness of breath. 11/21/17  Yes Shane Crutch, MD  alendronate (FOSAMAX) 70 MG tablet TAKE 1 TABLET WEEKLY  WITH A FULL GLASS OF WATER ON AN EMPTY STOMACH Patient taking differently: Take 70 mg by mouth once a week. On Tuesday 10/11/16  Yes [name], MD  aspirin EC 81 MG tablet Take 81 mg by mouth daily.   Yes [provider]  atorvastatin (LIPITOR) 40 MG tablet TAKE 1 TABLET (40 MG TOTAL) BY MOUTH DAILY. 12/28/17  Yes [name], MD  benazepril (LOTENSIN) 5 MG tablet Take 1 tablet (5 mg total) by mouth daily. 11/20/17  Yes [name], MD  Calcium Carb-Cholecalciferol (CALCIUM-VITAMIN D) 600-400 MG-UNIT TABS Take 1 tablet by mouth daily.   Yes [provider]  Cholecalciferol (VITAMIN D PO) Take 1 capsule by mouth daily.   Yes [provider]  clopidogrel (PLAVIX) 75 MG tablet TAKE 1 TABLET EVERY DAY Patient taking differently: Take 75 mg by mouth daily.  05/24/18  Yes [name],  MD  clotrimazole-betamethasone (LOTRISONE) cream Apply 1 application topically 2 (two) times daily. 07/02/18  Yes [name], MD  fexofenadine (ALLEGRA) 180 MG tablet Take 180 mg by mouth daily as needed for allergies.    Yes [provider]  fluconazole (DIFLUCAN) 150 MG tablet Take 1 tablet (150 mg total) by mouth once a week. 06/24/18  Yes [name], MD  fluticasone-salmeterol (ADVAIR HFA) [phone] MCG/ACT inhaler Inhale 2 puffs into the lungs 2 (two) times daily. Rinse mouth after use. 11/21/17  Yes Shane Crutch, MD   furosemide (LASIX) 20 MG tablet Take 1 tablet (20 mg total) by mouth as needed for fluid or edema. May take once or twice a week for swelling 05/07/18  Yes [name], MD  levothyroxine (SYNTHROID, LEVOTHROID) 50 MCG tablet Take 1 tablet (50 mcg total) by mouth daily before breakfast. 05/19/18  Yes [name], MD  nystatin (MYCOSTATIN/NYSTOP) powder Apply topically 3 (three) times daily. 06/24/18  Yes [name], MD  Potassium Chloride ER 20 MEQ TBCR Take 20 mEq by mouth as needed (whenever you take lasix). 07/02/18  Yes [name], MD  sertraline (ZOLOFT) 100 MG tablet Take 1 tablet (100 mg total) by mouth daily. 07/02/18  Yes [name], MD  traMADol (ULTRAM) 50 MG tablet Take 1-2 tablets (50-100 mg total) by mouth 3 (three) times daily as needed for moderate pain. 05/07/18  Yes [name], MD  traZODone (DESYREL) 100 MG tablet Take 1 tablet (100 mg total) by mouth at bedtime. 07/02/18  Yes [name], MD    ALLERGIES:  Allergies  Allergen Reactions  . Doxycycline Other (See Comments)    Sig GI upset    SOCIAL HISTORY:  Social History   Tobacco Use  . Smoking status: Former Smoker    Packs/day: 2.00    Years: 46.00    Pack years: 92.00    Types: Cigarettes    Last attempt to quit: 11/08/2012    Years since quitting: 5.7  . Smokeless tobacco: Never Used  Substance Use Topics  . Alcohol use: No    Alcohol/week: 0.0 standard drinks    FAMILY HISTORY: Family History  Problem Relation Age of Onset  . CAD Mother        MI  . Cancer Mother        cervical  . CAD Maternal Aunt        MI  . CAD Sister        MI  . Sudden death Father 21       died in his sleep, chain smoker  . Cirrhosis Father   . Hypertension Daughter   . Diabetes Maternal Aunt   . Cancer Maternal Grandmother        cervical    EXAM: BP (!) 177/7 (BP Location: Right Arm)   Pulse 74   Temp 98.5 F (36.9 C) (Oral)   SpO2 100%  CONSTITUTIONAL: Alert and  oriented and responds appropriately to questions. Well-appearing; well-nourished HEAD: Normocephalic EYES: Conjunctiva clear on the right, patient has a medial subconjunctival hemorrhage on the left, normal visual fields, extraocular movements intact, funduscopic exam limited, no foreign body appreciated ENT: normal nose; moist mucous membranes NECK: Supple, no meningismus, no nuchal rigidity, no LAD  CARD: RRR; S1 and S2 appreciated; no murmurs, no clicks, no rubs, no gallops RESP: Normal chest excursion without splinting or tachypnea; breath sounds clear and equal bilaterally; no wheezes, no rhonchi, no rales, no hypoxia or respiratory distress, speaking full sentences ABD/GI: Normal  bowel sounds; non-distended; soft, non-tender, no rebound, no guarding, no peritoneal signs, no hepatosplenomegaly BACK:  The back appears normal and is non-tender to palpation, there is no CVA tenderness EXT: Normal ROM in all joints; non-tender to palpation; no edema; normal capillary refill; no cyanosis, no calf tenderness or swelling    SKIN: Normal color for age and race; warm; no rash NEURO: Moves all extremities equally, strength 5/5 in all 4 extremities, cranial nerves II through XII intact, normal speech, reports decreased sensation in the left cheek and throughout the right arm PSYCH: The patient's mood and manner are appropriate. Grooming and personal hygiene are appropriate.  MEDICAL DECISION MAKING: Patient here with vision loss, left-sided facial numbness, right arm numbness.  Symptoms have improved and almost completely resolved.  I have an NIH stroke scale of 1 at this time.  Not a TPA candidate given symptoms improving.  CT head shows no acute abnormality.  Labs unremarkable.  EKG shows no arrhythmia.  Will discuss with neurology for recommendations.  ED PROGRESS:    11:55 PM D/w Dr. Otelia Limes with neurology.  Appreciate his help.  He recommends admission to medicine for TIA work-up and ophthalmology  consultation in the morning.   12:04 AM Discussed patient's case with hospitalist, Dr. Clyde Lundborg.  I have recommended admission and patient (and family if present) agree with this plan. Admitting physician will place admission orders.   I reviewed all nursing notes, vitals, pertinent previous records, EKGs, lab and urine results, imaging (as available).       EKG Interpretation  Date/Time:  Monday July 30 2018 22:22:10 EST Ventricular Rate:  74 PR Interval:  176 QRS Duration: 74 QT Interval:  398 QTC Calculation: 441 R Axis:   6 Text Interpretation:  Normal sinus rhythm Normal ECG No significant change since last tracing Confirmed by Kanyon Bunn, Baxter Hire [phone]) on 07/30/2018 11:15:42 PM          Farouk Vivero, Layla Maw, DO 07/31/18 [id number]

## 2018-07-30 NOTE — H&P (Signed)
History and Physical    [name] [name] BEM:[id number] DOB: 16-Jan-1946 DOA: 07/30/2018  Referring MD/NP/PA:   PCP: Ria Bush, MD   Patient coming from:  The patient is coming from home.  At baseline, pt is independent for most of ADL.        Chief Complaint: vision loss in left eye, left facial droop and left facial numbness  HPI: [name] [name] is a 72 y.o. female with medical history significant of hypertension, hyperlipidemia, COPD on 1 L nasal cannula oxygen at night, stroke with mild right sided weakness, hypothyroidism, depression, former smoker, PVD, CHF, carotid artery aneurysm, carotid artery stenosis, who presents with vision loss in left eye, left facial droop and left facial numbness.  Patient's daughter, patient suddenly lost vision in left eye at about 9:30 PM.  The symptoms lasted for about 1 hour, then resolved spontaneously.  She was noted to have left medial side eye conjunctiva bleeding.  At the same time, patient also started having left facial numbness and left facial droop, which has been persistent, no significant improvement now.  Patient states that she has minimal right sided weakness from previous stroke, which has not changed.  She also complains of left frontal headache.  No chest pain, shortness breath, cough, fever or chills.  Denies nausea, vomiting, diarrhea or abdominal pain pain no symptoms of UTI.  Patient is mildly confused, but still oriented x3 now.  Patient is currently being treated for intertrigo.  ED Course: pt was found to have WBC 5.9, INR 1.02, PTT 27, negative troponin, thrombocytopenia with platelets of 118, pending urinalysis, electrolytes renal function okay, temperature normal, no tachycardia, no tachypnea, oxygen saturation 95% on room air.  CT head is negative for acute intracranial abnormalities.  Patient is placed on telemetry bed for observation.  Neurology, Dr. Cheral Marker was consulted.  Review of Systems:   General: no fevers,  chills, no body weight gain, has fatigue HEENT: no hearing changes or sore throat. Has left eye vision loss and conjunctiva bleeding Respiratory: no dyspnea, coughing, wheezing CV: no chest pain, no palpitations GI: no nausea, vomiting, abdominal pain, diarrhea, constipation GU: no dysuria, burning on urination, increased urinary frequency, hematuria  Ext: no leg edema Neuro: no hearing loss.  Has minimal chronic right sided weakness.  Has new left facial numbness and facial droop Skin: No skin tear. MSK: No muscle spasm, no deformity, no limitation of range of movement in spin. Has mild confusion. Heme: No easy bruising.  Travel history: No recent long distant travel.  Allergy:  Allergies  Allergen Reactions  . Doxycycline Other (See Comments)    Sig GI upset    Past Medical History:  Diagnosis Date  . Aneurysm ([place])   . Anxiety   . Anxiety and depression   . Carotid stenosis    R 50% (12/2012)  . Concussion 08/03/2015  . COPD (chronic obstructive pulmonary disease) ([place]) 12/2012   spirometry: Pre: FVC 84%, FEV1 69%, ratio 0.64 consistent with moderate obstruction.  . Depression   . Fall 08/03/2015   d/c home health 08/2015  . Fracture of cervical vertebra, C5 (HCC) 08/06/2015  . History of chicken pox   . Hyperlipidemia   . Hypertension   . Lower back pain    h/o HNP s/p surgery  . Osteoarthritis    h/o ruptured disc s/p ESI  . Osteoporosis 11/2010   DEXA -2.7 spine, thoracic compression fracture  . Peripheral vascular disease ([place])   . Smoker  quit 10/2012  . Stroke [facility]) 2010   x3 with residual R hemiparesis, s/p R MCA balloon angioplasty (2010)    Past Surgical History:  Procedure Laterality Date  . APPENDECTOMY  1960  . CATARACT EXTRACTION     bilateral  . CESAREAN SECTION    . CHOLECYSTECTOMY  1970  . COLONOSCOPY  2004   diverticulosis, no polyps [facility])  . COLONOSCOPY  05/2016   decreased sphincter tone, diverticulosis, no f/u recommended ([place])    . DEXA  11/2010   T -2.7 spine, -1.9 hip  . HIP SURGERY Left 2006   fractured - screws placed  . IR RADIOLOGIST EVAL & MGMT  01/09/2018  . KYPHOPLASTY  10/02/2017   Procedure: LUMBAR FOUR KYPHOPLASTY;  Surgeon: Ashok Pall, MD;  Location: [facility];  Service: Neurosurgery;;  . RADIOLOGY WITH ANESTHESIA N/A 11/11/2015   Procedure: RADIOLOGY WITH ANESTHESIA;  Surgeon: Luanne Bras, MD;  Location: [facility];  Service: Radiology;  Laterality: N/A;    Social History:  reports that she quit smoking about 5 years ago. Her smoking use included cigarettes. She has a 92.00 pack-year smoking history. She has never used smokeless tobacco. She reports that she does not drink alcohol or use drugs.  Family History:  Family History  Problem Relation Age of Onset  . CAD Mother        MI  . Cancer Mother        cervical  . CAD Maternal Aunt        MI  . CAD Sister        MI  . Sudden death Father 73       died in his sleep, chain smoker  . Cirrhosis Father   . Hypertension Daughter   . Diabetes Maternal Aunt   . Cancer Maternal Grandmother        cervical     Prior to Admission medications   Medication Sig Start Date End Date Taking? Authorizing Provider  acetaminophen (TYLENOL) 500 MG tablet Take 500 mg by mouth 2 (two) times daily as needed for mild pain.    Yes [provider]  albuterol (PROVENTIL HFA;VENTOLIN HFA) 108 (90 Base) MCG/ACT inhaler Inhale 2 puffs into the lungs every 6 (six) hours as needed for wheezing or shortness of breath. 11/21/17  Yes Laverle Hobby, MD  alendronate (FOSAMAX) 70 MG tablet TAKE 1 TABLET WEEKLY  WITH A FULL GLASS OF WATER ON AN EMPTY STOMACH Patient taking differently: Take 70 mg by mouth once a week. On Tuesday 10/11/16  Yes Ria Bush, MD  aspirin EC 81 MG tablet Take 81 mg by mouth daily.   Yes [provider]  atorvastatin (LIPITOR) 40 MG tablet TAKE 1 TABLET (40 MG TOTAL) BY MOUTH DAILY. 12/28/17  Yes Ria Bush, MD   benazepril (LOTENSIN) 5 MG tablet Take 1 tablet (5 mg total) by mouth daily. 11/20/17  Yes Ria Bush, MD  Calcium Carb-Cholecalciferol (CALCIUM-VITAMIN D) 600-400 MG-UNIT TABS Take 1 tablet by mouth daily.   Yes [provider]  Cholecalciferol (VITAMIN D PO) Take 1 capsule by mouth daily.   Yes [provider]  clopidogrel (PLAVIX) 75 MG tablet TAKE 1 TABLET EVERY DAY Patient taking differently: Take 75 mg by mouth daily.  05/24/18  Yes Ria Bush, MD  clotrimazole-betamethasone (LOTRISONE) cream Apply 1 application topically 2 (two) times daily. 07/02/18  Yes Ria Bush, MD  fexofenadine (ALLEGRA) 180 MG tablet Take 180 mg by mouth daily as needed for allergies.  Yes [provider]  fluconazole (DIFLUCAN) 150 MG tablet Take 1 tablet (150 mg total) by mouth once a week. 06/24/18  Yes Ria Bush, MD  fluticasone-salmeterol (ADVAIR HFA) [phone] MCG/ACT inhaler Inhale 2 puffs into the lungs 2 (two) times daily. Rinse mouth after use. 11/21/17  Yes Laverle Hobby, MD  furosemide (LASIX) 20 MG tablet Take 1 tablet (20 mg total) by mouth as needed for fluid or edema. May take once or twice a week for swelling 05/07/18  Yes Ria Bush, MD  levothyroxine (SYNTHROID, LEVOTHROID) 50 MCG tablet Take 1 tablet (50 mcg total) by mouth daily before breakfast. 05/19/18  Yes Ria Bush, MD  nystatin (MYCOSTATIN/NYSTOP) powder Apply topically 3 (three) times daily. 06/24/18  Yes Ria Bush, MD  Potassium Chloride ER 20 MEQ TBCR Take 20 mEq by mouth as needed (whenever you take lasix). 07/02/18  Yes Ria Bush, MD  sertraline (ZOLOFT) 100 MG tablet Take 1 tablet (100 mg total) by mouth daily. 07/02/18  Yes Ria Bush, MD  traMADol (ULTRAM) 50 MG tablet Take 1-2 tablets (50-100 mg total) by mouth 3 (three) times daily as needed for moderate pain. 05/07/18  Yes Ria Bush, MD  traZODone (DESYREL) 100 MG tablet Take 1 tablet  (100 mg total) by mouth at bedtime. 07/02/18  Yes Ria Bush, MD    Physical Exam: Vitals:   07/30/18 2214 07/30/18 2315 07/31/18 0243  BP: (!) 177/7 (!) 151/65   Pulse: 74 66   Resp:  16   Temp: 98.5 F (36.9 C)  97.8 F (36.6 C)  TempSrc: Oral    SpO2: 100% 97%    General: Not in acute distress HEENT:       Eyes: PERRL, EOMI, no scleral icterus.       ENT: No discharge from the ears and nose, no pharynx injection, no tonsillar enlargement.        Neck: No JVD, no bruit, no mass felt. Heme: No neck lymph node enlargement. Cardiac: S1/S2, RRR, No murmurs, No gallops or rubs. Respiratory: No rales, wheezing, rhonchi or rubs. GI: Soft, nondistended, nontender, no rebound pain, no organomegaly, BS present. GU: No hematuria Ext: No pitting leg edema bilaterally. 2+DP/PT pulse bilaterally. Musculoskeletal: No joint deformities, No joint redness or warmth, no limitation of ROM in spin. Skin: No rashes.  Neuro: has mild confusion, but oriented X3, cranial nerves II-XII grossly intact except for CN-VII with left-sided facial droop and decreased sensation in left face.  Moves all extremities normally. Muscle strength 5/5 in all extremities, sensation to light touch intact. Brachial reflex 2+ bilaterally. Negative Babinski's sign.  Psych: Patient is not psychotic, no suicidal or hemocidal ideation.  Labs on Admission: I have personally reviewed following labs and imaging studies  CBC: Recent Labs  Lab 07/30/18 2223 07/30/18 2229  WBC 5.9  --   NEUTROABS 3.7  --   HGB 12.9 12.9  HCT 42.1 38.0  MCV 95.5  --   PLT 118*  --    Basic Metabolic Panel: Recent Labs  Lab 07/30/18 2223 07/30/18 2229  NA 138 141  K 3.9 3.9  CL 107 107  CO2 24  --   GLUCOSE 137* 136*  BUN 17 21  CREATININE 1.02* 1.00  CALCIUM 8.9  --    GFR: Estimated Creatinine Clearance: 47 mL/min (by C-G formula based on SCr of 1 mg/dL). Liver Function Tests: Recent Labs  Lab 07/30/18 2223  AST 21    ALT 11  ALKPHOS 106  BILITOT 0.6  PROT 6.9  ALBUMIN 3.6   No results for input(s): LIPASE, AMYLASE in the last 168 hours. No results for input(s): AMMONIA in the last 168 hours. Coagulation Profile: Recent Labs  Lab 07/30/18 2223  INR 1.02   Cardiac Enzymes: No results for input(s): CKTOTAL, CKMB, CKMBINDEX, TROPONINI in the last 168 hours. BNP (last 3 results) No results for input(s): PROBNP in the last 8760 hours. HbA1C: Recent Labs    07/31/18 0217  HGBA1C 5.1   CBG: Recent Labs  Lab 07/30/18 2223  GLUCAP 134*   Lipid Profile: Recent Labs    07/31/18 0217  CHOL 183  HDL 64  LDLCALC 103*  TRIG 79  CHOLHDL 2.9   Thyroid Function Tests: No results for input(s): TSH, T4TOTAL, FREET4, T3FREE, THYROIDAB in the last 72 hours. Anemia Panel: No results for input(s): VITAMINB12, FOLATE, FERRITIN, TIBC, IRON, RETICCTPCT in the last 72 hours. Urine analysis:    Component Value Date/Time   COLORURINE YELLOW 09/30/2017 1616   APPEARANCEUR CLEAR 09/30/2017 1616   LABSPEC 1.012 09/30/2017 1616   PHURINE 6.0 09/30/2017 1616   GLUCOSEU NEGATIVE 09/30/2017 1616   HGBUR NEGATIVE 09/30/2017 1616   BILIRUBINUR negative 01/30/2018 0921   KETONESUR NEGATIVE 09/30/2017 1616   PROTEINUR negative 01/30/2018 0921   PROTEINUR NEGATIVE 09/30/2017 1616   UROBILINOGEN 0.2 01/30/2018 0921   UROBILINOGEN 0.2 08/02/2015 1200   NITRITE positive 01/30/2018 0921   NITRITE NEGATIVE 09/30/2017 1616   LEUKOCYTESUR Small (1+) (A) 01/30/2018 0921   Sepsis Labs: '@LABRCNTIP'$ (procalcitonin:4,lacticidven:4) )No results found for this or any previous visit (from the past 240 hour(s)).   Radiological Exams on Admission: Ct Head Code Stroke Wo Contrast  Result Date: 07/30/2018 CLINICAL DATA:  Code stroke. Left eye vision loss and facial numbness. EXAM: CT HEAD WITHOUT CONTRAST TECHNIQUE: Contiguous axial images were obtained from the base of the skull through the vertex without intravenous  contrast. COMPARISON:  12/28/2017 head CT FINDINGS: Brain: There is no mass, hemorrhage or extra-axial collection. There is generalized brain atrophy greater than expected at this age. There is hypoattenuation of the periventricular white matter, most commonly indicating chronic ischemic microangiopathy. Vascular: Atherosclerotic calcification of the internal carotid arteries at the skull base. No abnormal hyperdensity of the major intracranial arteries or dural venous sinuses. Skull: The visualized skull base, calvarium and extracranial soft tissues are normal. Sinuses/Orbits: No fluid levels or advanced mucosal thickening of the visualized paranasal sinuses. No mastoid or middle ear effusion. The orbits are normal. ASPECTS [facility] Stroke Program Early CT Score) - Ganglionic level infarction (caudate, lentiform nuclei, internal capsule, insula, M1-M3 cortex): 7 - Supraganglionic infarction (M4-M6 cortex): 3 Total score (0-10 with 10 being normal): 10 IMPRESSION: 1. No acute hemorrhage. 2. Advanced atrophy and chronic microvascular ischemia. 3. ASPECTS is 10. These results were communicated to Dr. Kerney Elbe at 10:42 pm on 07/30/2018 by text page via the [facility] messaging system. Electronically Signed   By: Ulyses Jarred M.D.   On: 07/30/2018 22:43     EKG: Independently reviewed.  Sinus rhythm, QTC 441, T wave inversion only in lead III, low voltage.  Assessment/Plan Principal Problem:   Stroke [facility]) Active Problems:   Hyperlipidemia   MDD (major depressive disorder), recurrent episode, moderate (HCC)   Hypothyroidism (acquired)   HTN (hypertension)   Chronic diastolic heart failure (HCC)   Intertrigo   Vision loss of left eye   Acute metabolic encephalopathy   Thrombocytopenia ([place])   Stroke [facility]): Patient has transient vision loss, persistent left facial  droop and numbness, concerning for new stroke versus TIA.  CT head is negative for acute intracranial abnormalities.  Urology, Dr. Mendel Ryder  was consulted.  - will place on tele bed for obs - will follow up Neurology's Recs.  - Obtain MRI/MRA  - Check carotid dopplers  - Continue Plavix  - fasting lipid panel and HbA1c  - 2D transthoracic echocardiography  - swallowing screen. If fails, will get SLP - PT/OT consult  Left eye vision loss: most likely due to possible stroke. Other DD includes CRAO, BRAO,, BRVO, CRVO; and temporal artery arthritis -may need MRI orbits with and without contrast, but will f/u neurologist recommendation -Check ESR and CRP -need to call ophthalmologist in the morning  Carotid artery stenosis: History of high-grade stenosis of the left internal carotid artery cavernous segment. She recently saw Dr. Estanislado Pandy -f/u carotid artery Doppler  HLD: -lipitor  MDD (major depressive disorder), recurrent episode, moderate ([place]): -Continue Zoloft  Essential hypertension: -IV Hydralazine prn for SBP>220 and dBP>120 -hold blood pressure medications to allow permissive hypertension  Hypothyroidism: Last TSH was 2.64 on 06/27/2018 -Continue home Synthroid  Chronic diastolic heart failure ([place]): 2D echo on 11/14/2017 showed EF 55-60% with grade 1 diastolic dysfunction.  Patient does not have leg edema JVD.  No respiratory distress.  CHF is compensated. -Hold Lasix -Check BNP  Acute metabolic encephalopathy: Patient has some mild confusion, but is still oriented x3.  Most likely due to stroke.  -Frequent neuro check -Follow-up urinalysis  Thrombocytopenia: Platelets 118.  Etiology is not clear if no bleeding tendency P -Check LDH, peripheral smear, followed by CBC  Intertrigo: -On Diflucan, topical nystatin powder and cream of clotrimazole- betamethasone   DVT ppx: SCD Code Status: Partial code (I discussed with the patient in the presence of her daughter who is POA, and explained the meaning of CODE STATUS, patient wants to be partial code, OK for CPR, but no intubation). Family Communication: Yes,  patient's daughter and son-in-law at bed side Disposition Plan:  Anticipate discharge back to previous home environment Consults called: Neurology, Dr. Mendel Ryder Admission status: Obs / tele  Date of Service 07/31/2018    Ivor Costa [facility] Pager [phone]  If 7PM-7AM, please contact night-coverage [email or web address] Password [email or web address] 07/31/2018, 3:22 AM

## 2018-07-31 ENCOUNTER — Observation Stay (HOSPITAL_COMMUNITY): Payer: Medicare HMO

## 2018-07-31 ENCOUNTER — Encounter (HOSPITAL_COMMUNITY): Payer: Self-pay | Admitting: Internal Medicine

## 2018-07-31 ENCOUNTER — Ambulatory Visit (HOSPITAL_BASED_OUTPATIENT_CLINIC_OR_DEPARTMENT_OTHER): Payer: Medicare HMO

## 2018-07-31 DIAGNOSIS — I6602 Occlusion and stenosis of left middle cerebral artery: Secondary | ICD-10-CM | POA: Diagnosis not present

## 2018-07-31 DIAGNOSIS — I639 Cerebral infarction, unspecified: Secondary | ICD-10-CM | POA: Diagnosis not present

## 2018-07-31 DIAGNOSIS — I5030 Unspecified diastolic (congestive) heart failure: Secondary | ICD-10-CM

## 2018-07-31 DIAGNOSIS — R2990 Unspecified symptoms and signs involving the nervous system: Secondary | ICD-10-CM | POA: Diagnosis not present

## 2018-07-31 DIAGNOSIS — G9341 Metabolic encephalopathy: Secondary | ICD-10-CM | POA: Diagnosis present

## 2018-07-31 DIAGNOSIS — H5462 Unqualified visual loss, left eye, normal vision right eye: Secondary | ICD-10-CM | POA: Diagnosis present

## 2018-07-31 DIAGNOSIS — D696 Thrombocytopenia, unspecified: Secondary | ICD-10-CM | POA: Diagnosis not present

## 2018-07-31 DIAGNOSIS — I671 Cerebral aneurysm, nonruptured: Secondary | ICD-10-CM | POA: Diagnosis not present

## 2018-07-31 DIAGNOSIS — R200 Anesthesia of skin: Secondary | ICD-10-CM

## 2018-07-31 LAB — URINALYSIS, ROUTINE W REFLEX MICROSCOPIC
Bilirubin Urine: NEGATIVE
Glucose, UA: NEGATIVE mg/dL
Hgb urine dipstick: NEGATIVE
Ketones, ur: NEGATIVE mg/dL
Leukocytes, UA: NEGATIVE
Nitrite: NEGATIVE
Protein, ur: NEGATIVE mg/dL
Specific Gravity, Urine: 1.01 (ref 1.005–1.030)
pH: 5 (ref 5.0–8.0)

## 2018-07-31 LAB — LIPID PANEL
Cholesterol: 183 mg/dL (ref 0–200)
HDL: 64 mg/dL (ref >40)
LDL Cholesterol: 103 mg/dL — ABNORMAL HIGH (ref 0–99)
Total CHOL/HDL Ratio: 2.9 RATIO
Triglycerides: 79 mg/dL (ref <150)
VLDL: 16 mg/dL (ref 0–40)

## 2018-07-31 LAB — C-REACTIVE PROTEIN: CRP: <0.8 mg/dL (ref <1.0)

## 2018-07-31 LAB — RAPID URINE DRUG SCREEN, HOSP PERFORMED
Amphetamines: NOT DETECTED
Barbiturates: NOT DETECTED
Benzodiazepines: NOT DETECTED
Cocaine: NOT DETECTED
Opiates: NOT DETECTED
Tetrahydrocannabinol: NOT DETECTED

## 2018-07-31 LAB — ECHOCARDIOGRAM COMPLETE
Height: 61.5 in
Weight: 2496 [oz_av]

## 2018-07-31 LAB — HEMOGLOBIN A1C
Hgb A1c MFr Bld: 5.1 % (ref 4.8–5.6)
Mean Plasma Glucose: 99.67 mg/dL

## 2018-07-31 LAB — PLATELET INHIBITION P2Y12: Platelet Function  P2Y12: 111 [PRU] — ABNORMAL LOW (ref 194–418)

## 2018-07-31 LAB — SAVE SMEAR(SSMR), FOR PROVIDER SLIDE REVIEW

## 2018-07-31 LAB — SEDIMENTATION RATE: Sed Rate: 10 mm/hr (ref 0–22)

## 2018-07-31 LAB — LACTATE DEHYDROGENASE: LDH: 141 U/L (ref 98–192)

## 2018-07-31 LAB — BRAIN NATRIURETIC PEPTIDE: B Natriuretic Peptide: 105.5 pg/mL — ABNORMAL HIGH (ref 0.0–100.0)

## 2018-07-31 MED ORDER — ONDANSETRON HCL 4 MG/2ML IJ SOLN: 4.0000 mg | Freq: Three times a day (TID) | INTRAMUSCULAR | Status: DC | PRN

## 2018-07-31 MED ORDER — CLOPIDOGREL BISULFATE 75 MG PO TABS
75.0000 mg | ORAL_TABLET | Freq: Every day | ORAL | Status: DC
  Administered 2018-07-31 – 2018-08-02 (×3): 75 mg via ORAL
  Filled 2018-07-31 (×3): qty 1

## 2018-07-31 MED ORDER — ACETAMINOPHEN 500 MG PO TABS: 500.0000 mg | ORAL_TABLET | Freq: Four times a day (QID) | ORAL | Status: DC | PRN

## 2018-07-31 MED ORDER — SODIUM CHLORIDE 0.9 % IV SOLN
500.0000 mL | INTRAVENOUS | Status: DC
  Administered 2018-07-31: 500 mL via INTRAVENOUS

## 2018-07-31 MED ORDER — SERTRALINE HCL 100 MG PO TABS
100.0000 mg | ORAL_TABLET | Freq: Every day | ORAL | Status: DC
  Administered 2018-07-31 – 2018-08-02 (×3): 100 mg via ORAL
  Filled 2018-07-31 (×3): qty 1

## 2018-07-31 MED ORDER — HYDRALAZINE HCL 20 MG/ML IJ SOLN: 5.0000 mg | INTRAMUSCULAR | Status: DC | PRN

## 2018-07-31 MED ORDER — TRAMADOL HCL 50 MG PO TABS
50.0000 mg | ORAL_TABLET | Freq: Four times a day (QID) | ORAL | Status: DC | PRN
  Administered 2018-07-31 – 2018-08-01 (×3): 50 mg via ORAL
  Filled 2018-07-31 (×3): qty 1

## 2018-07-31 MED ORDER — NYSTATIN 100000 UNIT/GM EX POWD
Freq: Three times a day (TID) | CUTANEOUS | Status: DC
  Administered 2018-08-01 – 2018-08-02 (×2): via TOPICAL
  Filled 2018-07-31: qty 15

## 2018-07-31 MED ORDER — CALCIUM CARBONATE-VITAMIN D 500-200 MG-UNIT PO TABS
1.0000 | ORAL_TABLET | Freq: Every day | ORAL | Status: DC
  Administered 2018-08-01 – 2018-08-02 (×2): 1 via ORAL
  Filled 2018-07-31 (×3): qty 1

## 2018-07-31 MED ORDER — LORATADINE 10 MG PO TABS
10.0000 mg | ORAL_TABLET | Freq: Every day | ORAL | Status: DC
  Administered 2018-07-31 – 2018-08-02 (×3): 10 mg via ORAL
  Filled 2018-07-31 (×3): qty 1

## 2018-07-31 MED ORDER — MOMETASONE FURO-FORMOTEROL FUM 200-5 MCG/ACT IN AERO
2.0000 | INHALATION_SPRAY | Freq: Two times a day (BID) | RESPIRATORY_TRACT | Status: DC
  Administered 2018-08-01 – 2018-08-02 (×2): 2 via RESPIRATORY_TRACT
  Filled 2018-07-31: qty 8.8

## 2018-07-31 MED ORDER — CLOTRIMAZOLE-BETAMETHASONE 1-0.05 % EX CREA
1.0000 "application " | TOPICAL_CREAM | Freq: Two times a day (BID) | CUTANEOUS | Status: DC
  Administered 2018-08-02: 1 via TOPICAL
  Filled 2018-07-31: qty 15

## 2018-07-31 MED ORDER — LEVOTHYROXINE SODIUM 50 MCG PO TABS
50.0000 ug | ORAL_TABLET | Freq: Every day | ORAL | Status: DC
  Administered 2018-08-01 – 2018-08-02 (×2): 50 ug via ORAL
  Filled 2018-07-31 (×2): qty 1

## 2018-07-31 MED ORDER — FLUCONAZOLE 150 MG PO TABS
150.0000 mg | ORAL_TABLET | ORAL | Status: DC
  Administered 2018-07-31: 150 mg via ORAL
  Filled 2018-07-31: qty 1

## 2018-07-31 MED ORDER — VITAMIN D 1000 UNITS PO TABS
1000.0000 [IU] | ORAL_TABLET | Freq: Every day | ORAL | Status: DC
  Administered 2018-07-31 – 2018-08-02 (×3): 1000 [IU] via ORAL
  Filled 2018-07-31 (×2): qty 1

## 2018-07-31 MED ORDER — STROKE: EARLY STAGES OF RECOVERY BOOK
Freq: Once | Status: DC
  Filled 2018-07-31 (×2): qty 1

## 2018-07-31 MED ORDER — BUTALBITAL-APAP-CAFFEINE 50-325-40 MG PO TABS
1.0000 | ORAL_TABLET | Freq: Three times a day (TID) | ORAL | Status: DC | PRN
  Administered 2018-07-31: 1 via ORAL
  Filled 2018-07-31: qty 1

## 2018-07-31 MED ORDER — ATORVASTATIN CALCIUM 40 MG PO TABS
40.0000 mg | ORAL_TABLET | Freq: Every day | ORAL | Status: DC
  Administered 2018-07-31 – 2018-08-02 (×3): 40 mg via ORAL
  Filled 2018-07-31: qty 1
  Filled 2018-07-31: qty 4
  Filled 2018-07-31: qty 1

## 2018-07-31 MED ORDER — ASPIRIN EC 325 MG PO TBEC
325.0000 mg | DELAYED_RELEASE_TABLET | Freq: Every day | ORAL | Status: DC
  Administered 2018-08-01 – 2018-08-02 (×2): 325 mg via ORAL
  Filled 2018-07-31 (×2): qty 1

## 2018-07-31 MED ORDER — ZOLPIDEM TARTRATE 5 MG PO TABS: 5.0000 mg | ORAL_TABLET | Freq: Every evening | ORAL | Status: DC | PRN

## 2018-07-31 MED ORDER — TRAZODONE HCL 100 MG PO TABS
100.0000 mg | ORAL_TABLET | Freq: Every day | ORAL | Status: DC
  Administered 2018-07-31 – 2018-08-01 (×2): 100 mg via ORAL
  Filled 2018-07-31 (×2): qty 1

## 2018-07-31 MED ORDER — SENNOSIDES-DOCUSATE SODIUM 8.6-50 MG PO TABS: 1.0000 | ORAL_TABLET | Freq: Every evening | ORAL | Status: DC | PRN

## 2018-07-31 MED ORDER — ALBUTEROL SULFATE (2.5 MG/3ML) 0.083% IN NEBU: 2.5000 mg | INHALATION_SOLUTION | RESPIRATORY_TRACT | Status: DC | PRN

## 2018-07-31 MED ORDER — ASPIRIN EC 81 MG PO TBEC
81.0000 mg | DELAYED_RELEASE_TABLET | Freq: Every day | ORAL | Status: DC
  Administered 2018-07-31: 81 mg via ORAL
  Filled 2018-07-31: qty 1

## 2018-07-31 NOTE — Progress Notes (Signed)
Pt c/o of worsening pressure in left eye. MD notified, RN will continue to assess.

## 2018-07-31 NOTE — Progress Notes (Signed)
STROKE TEAM PROGRESS NOTE   SUBJECTIVE (INTERVAL HISTORY) Her daughters are at the bedside.  Overall she feels her condition is gradually improving. She was not able to see anything out of her left eye en route to [facility] but now she can see on the left eye but still very blurry. She still has left subconjunctival hemorrhage.    OBJECTIVE Temp:  [97.8 F (36.6 C)-98.5 F (36.9 C)] 97.8 F (36.6 C) (11/05 0243) Pulse Rate:  [57-117] 74 (11/05 1300) Resp:  [14-19] 16 (11/05 1300) BP: (110-177)/(7-65) 134/55 (11/05 0600) SpO2:  [81 %-100 %] 96 % (11/05 1300)  Recent Labs  Lab 07/30/18 2223  GLUCAP 134*   Recent Labs  Lab 07/30/18 2223 07/30/18 2229  NA 138 141  K 3.9 3.9  CL 107 107  CO2 24  --   GLUCOSE 137* 136*  BUN 17 21  CREATININE 1.02* 1.00  CALCIUM 8.9  --    Recent Labs  Lab 07/30/18 2223  AST 21  ALT 11  ALKPHOS 106  BILITOT 0.6  PROT 6.9  ALBUMIN 3.6   Recent Labs  Lab 07/30/18 2223 07/30/18 2229  WBC 5.9  --   NEUTROABS 3.7  --   HGB 12.9 12.9  HCT 42.1 38.0  MCV 95.5  --   PLT 118*  --    No results for input(s): CKTOTAL, CKMB, CKMBINDEX, TROPONINI in the last 168 hours. Recent Labs    07/30/18 2223  LABPROT 13.3  INR 1.02   Recent Labs    07/31/18 0817  COLORURINE YELLOW  LABSPEC 1.010  PHURINE 5.0  GLUCOSEU NEGATIVE  HGBUR NEGATIVE  BILIRUBINUR NEGATIVE  KETONESUR NEGATIVE  PROTEINUR NEGATIVE  NITRITE NEGATIVE  LEUKOCYTESUR NEGATIVE       Component Value Date/Time   CHOL 183 07/31/2018 0217   TRIG 79 07/31/2018 0217   HDL 64 07/31/2018 0217   CHOLHDL 2.9 07/31/2018 0217   VLDL 16 07/31/2018 0217   LDLCALC 103 (H) 07/31/2018 0217   Lab Results  Component Value Date   HGBA1C 5.1 07/31/2018      Component Value Date/Time   LABOPIA NONE DETECTED 07/31/2018 0817   COCAINSCRNUR NONE DETECTED 07/31/2018 0817   LABBENZ NONE DETECTED 07/31/2018 0817   AMPHETMU NONE DETECTED 07/31/2018 0817   THCU NONE DETECTED 07/31/2018  0817   LABBARB NONE DETECTED 07/31/2018 0817    No results for input(s): ETH in the last 168 hours.  I have personally reviewed the radiological images below and agree with the radiology interpretations.  Ct Angio Head W Or Wo Contrast  Result Date: 07/18/2018 CLINICAL DATA:  Followup intracranial and extracranial arterial stenoses. EXAM: CT ANGIOGRAPHY HEAD AND NECK TECHNIQUE: Multidetector CT imaging of the head and neck was performed using the standard protocol during bolus administration of intravenous contrast. Multiplanar CT image reconstructions and MIPs were obtained to evaluate the vascular anatomy. Carotid stenosis measurements (when applicable) are obtained utilizing NASCET criteria, using the distal internal carotid diameter as the denominator. CONTRAST:  42m ISOVUE-370 IOPAMIDOL (ISOVUE-370) INJECTION 76% COMPARISON:  12/28/2017 FINDINGS: CT HEAD FINDINGS Brain: There is no evidence of acute infarct, intracranial hemorrhage, mass, midline shift, or extra-axial fluid collection. Advanced atrophy is again noted in the parietal greater than frontal lobes bilaterally, unchanged. Patchy cerebral white matter hypodensities are unchanged and nonspecific but compatible with mild chronic small vessel ischemic disease. Vascular: Calcified atherosclerosis at the skull base. Skull: No fracture or focal osseous lesion. Sinuses: Clear paranasal sinuses and mastoid air cells.  Orbits: Bilateral cataract extraction. Review of the MIP images confirms the above findings CTA NECK FINDINGS Aortic arch: Standard 3 vessel aortic arch with moderate calcified and soft plaque. Calcified and soft plaque at the right subclavian artery origin with unchanged severe stenosis. Patent left subclavian artery with diffuse atherosclerosis but no significant proximal stenosis. Right carotid system: Patent common carotid artery with limited assessment of its origin due to motion artifact though with suspected at least mild  stenosis. Additional calcified and soft plaque in the mid and distal common carotid artery resulting in up to mild stenosis. Extensive calcified plaque at the carotid bifurcation results in 50% ICA origin stenosis, unchanged. Left carotid system: Patent common carotid artery with primarily soft plaque resulting in up to 55% stenosis, not significantly changed though better demonstrated on today's study. 50% stenosis of the mid common carotid artery associated with a short dissection flap/web or focus of plaque ulceration is unchanged. Predominantly calcified plaque in the carotid bulb results in less than 50% narrowing, unchanged. Vertebral arteries: The vertebral arteries are patent with the left being strongly dominant. Severe right and moderate left vertebral artery origin stenoses are unchanged. Skeleton: Mild-to-moderate lower cervical disc degeneration. Other neck: No evidence of acute abnormality or mass. Upper chest: Mild centrilobular emphysema. Small amount of material in the right aspect of the trachea near the thoracic inlet with a different configuration than on the prior study favored to represent secretions. Review of the MIP images confirms the above findings CTA HEAD FINDINGS Anterior circulation: The internal carotid arteries are patent from skull base to carotid termini with similar appearance of extensive atherosclerosis bilaterally including severe distal left cavernous and moderate right anterior genu stenoses. Focally ectasia of the right cavernous ICA is unchanged. ACAs and MCAs are patent with stable to mildly progressive severe tandem right M1 stenoses. Posterior circulation: The intracranial vertebral arteries are widely patent to the basilar. Patent left PICA, bilateral AICA, and bilateral SCA origins are visualized. The basilar artery is widely patent. Posterior communicating arteries are not identified. The PCAs are patent with atherosclerotic irregularity bilaterally and mild right and  severe left P2 stenoses, stable to slightly progressed. No aneurysm is identified. Venous sinuses: Patent. Anatomic variants: None. Delayed phase: No abnormal enhancement. Review of the MIP images confirms the above findings IMPRESSION: 1. Advanced intracranial atherosclerosis including unchanged severe left cavernous ICA stenosis and stable to slight progression of severe right M1 and left P2 stenoses. 2. Unchanged atherosclerosis in the neck including 50% right ICA origin stenosis, 50-55% left common carotid artery stenoses, severe right subclavian artery origin stenosis, and severe right and moderate left vertebral artery origin stenoses. 3. No evidence of acute intracranial abnormality. 4. Unchanged frontoparietal atrophy. 5. Aortic Atherosclerosis (ICD10-I70.0) and Emphysema (ICD10-J43.9). Electronically Signed   By: Logan Bores M.D.   On: 07/18/2018 18:14   Ct Angio Neck W Or Wo Contrast  Result Date: 07/18/2018 CLINICAL DATA:  Followup intracranial and extracranial arterial stenoses. EXAM: CT ANGIOGRAPHY HEAD AND NECK TECHNIQUE: Multidetector CT imaging of the head and neck was performed using the standard protocol during bolus administration of intravenous contrast. Multiplanar CT image reconstructions and MIPs were obtained to evaluate the vascular anatomy. Carotid stenosis measurements (when applicable) are obtained utilizing NASCET criteria, using the distal internal carotid diameter as the denominator. CONTRAST:  21m ISOVUE-370 IOPAMIDOL (ISOVUE-370) INJECTION 76% COMPARISON:  12/28/2017 FINDINGS: CT HEAD FINDINGS Brain: There is no evidence of acute infarct, intracranial hemorrhage, mass, midline shift, or extra-axial fluid collection. Advanced atrophy  is again noted in the parietal greater than frontal lobes bilaterally, unchanged. Patchy cerebral white matter hypodensities are unchanged and nonspecific but compatible with mild chronic small vessel ischemic disease. Vascular: Calcified  atherosclerosis at the skull base. Skull: No fracture or focal osseous lesion. Sinuses: Clear paranasal sinuses and mastoid air cells. Orbits: Bilateral cataract extraction. Review of the MIP images confirms the above findings CTA NECK FINDINGS Aortic arch: Standard 3 vessel aortic arch with moderate calcified and soft plaque. Calcified and soft plaque at the right subclavian artery origin with unchanged severe stenosis. Patent left subclavian artery with diffuse atherosclerosis but no significant proximal stenosis. Right carotid system: Patent common carotid artery with limited assessment of its origin due to motion artifact though with suspected at least mild stenosis. Additional calcified and soft plaque in the mid and distal common carotid artery resulting in up to mild stenosis. Extensive calcified plaque at the carotid bifurcation results in 50% ICA origin stenosis, unchanged. Left carotid system: Patent common carotid artery with primarily soft plaque resulting in up to 55% stenosis, not significantly changed though better demonstrated on today's study. 50% stenosis of the mid common carotid artery associated with a short dissection flap/web or focus of plaque ulceration is unchanged. Predominantly calcified plaque in the carotid bulb results in less than 50% narrowing, unchanged. Vertebral arteries: The vertebral arteries are patent with the left being strongly dominant. Severe right and moderate left vertebral artery origin stenoses are unchanged. Skeleton: Mild-to-moderate lower cervical disc degeneration. Other neck: No evidence of acute abnormality or mass. Upper chest: Mild centrilobular emphysema. Small amount of material in the right aspect of the trachea near the thoracic inlet with a different configuration than on the prior study favored to represent secretions. Review of the MIP images confirms the above findings CTA HEAD FINDINGS Anterior circulation: The internal carotid arteries are patent from  skull base to carotid termini with similar appearance of extensive atherosclerosis bilaterally including severe distal left cavernous and moderate right anterior genu stenoses. Focally ectasia of the right cavernous ICA is unchanged. ACAs and MCAs are patent with stable to mildly progressive severe tandem right M1 stenoses. Posterior circulation: The intracranial vertebral arteries are widely patent to the basilar. Patent left PICA, bilateral AICA, and bilateral SCA origins are visualized. The basilar artery is widely patent. Posterior communicating arteries are not identified. The PCAs are patent with atherosclerotic irregularity bilaterally and mild right and severe left P2 stenoses, stable to slightly progressed. No aneurysm is identified. Venous sinuses: Patent. Anatomic variants: None. Delayed phase: No abnormal enhancement. Review of the MIP images confirms the above findings IMPRESSION: 1. Advanced intracranial atherosclerosis including unchanged severe left cavernous ICA stenosis and stable to slight progression of severe right M1 and left P2 stenoses. 2. Unchanged atherosclerosis in the neck including 50% right ICA origin stenosis, 50-55% left common carotid artery stenoses, severe right subclavian artery origin stenosis, and severe right and moderate left vertebral artery origin stenoses. 3. No evidence of acute intracranial abnormality. 4. Unchanged frontoparietal atrophy. 5. Aortic Atherosclerosis (ICD10-I70.0) and Emphysema (ICD10-J43.9). Electronically Signed   By: Logan Bores M.D.   On: 07/18/2018 18:14   Mr Brain Wo Contrast  Result Date: 07/31/2018 CLINICAL DATA:  Left eye vision loss EXAM: MRI HEAD WITHOUT CONTRAST MRA HEAD WITHOUT CONTRAST TECHNIQUE: Multiplanar, multiecho pulse sequences of the brain and surrounding structures were obtained without intravenous contrast. Angiographic images of the head were obtained using MRA technique without contrast. COMPARISON:  Head CT 07/30/2018  FINDINGS: MRI HEAD  FINDINGS BRAIN: There is no acute infarct, acute hemorrhage or mass effect. The midline structures are normal. There are no old infarcts. Multifocal white matter hyperintensity, most commonly due to chronic ischemic microangiopathy. Advanced atrophy for age. Foci of chronic microhemorrhage in the right frontal and parietal lobes. SKULL AND UPPER CERVICAL SPINE: The visualized skull base, calvarium, upper cervical spine and extracranial soft tissues are normal. SINUSES/ORBITS: No fluid levels or advanced mucosal thickening. No mastoid or middle ear effusion. The orbits are normal. MRA HEAD FINDINGS Intracranial internal carotid arteries: Unchanged severe narrowing of the distal cavernous segment of the left ICA. Anterior cerebral arteries: Normal. Middle cerebral arteries: Unchanged moderate right M1 narrowing. Normal left MCA. Posterior communicating arteries: Present bilaterally. Posterior cerebral arteries: No proximal occlusion. Basilar artery: Normal. Vertebral arteries: Left dominant. Normal. Superior cerebellar arteries: Normal. Inferior cerebellar arteries: Right PICA not clearly seen. IMPRESSION: 1. No acute intracranial abnormality. 2. Severe atrophy with findings of chronic ischemic microangiopathy. 3. Unchanged severe stenosis of the distal cavernous segment of the left internal carotid artery. 4. Unchanged moderate right M1 MCA segment narrowing. Electronically Signed   By: Ulyses Jarred M.D.   On: 07/31/2018 05:24   Mr Jodene Nam Head Wo Contrast  Result Date: 07/31/2018 CLINICAL DATA:  Left eye vision loss EXAM: MRI HEAD WITHOUT CONTRAST MRA HEAD WITHOUT CONTRAST TECHNIQUE: Multiplanar, multiecho pulse sequences of the brain and surrounding structures were obtained without intravenous contrast. Angiographic images of the head were obtained using MRA technique without contrast. COMPARISON:  Head CT 07/30/2018 FINDINGS: MRI HEAD FINDINGS BRAIN: There is no acute infarct, acute hemorrhage  or mass effect. The midline structures are normal. There are no old infarcts. Multifocal white matter hyperintensity, most commonly due to chronic ischemic microangiopathy. Advanced atrophy for age. Foci of chronic microhemorrhage in the right frontal and parietal lobes. SKULL AND UPPER CERVICAL SPINE: The visualized skull base, calvarium, upper cervical spine and extracranial soft tissues are normal. SINUSES/ORBITS: No fluid levels or advanced mucosal thickening. No mastoid or middle ear effusion. The orbits are normal. MRA HEAD FINDINGS Intracranial internal carotid arteries: Unchanged severe narrowing of the distal cavernous segment of the left ICA. Anterior cerebral arteries: Normal. Middle cerebral arteries: Unchanged moderate right M1 narrowing. Normal left MCA. Posterior communicating arteries: Present bilaterally. Posterior cerebral arteries: No proximal occlusion. Basilar artery: Normal. Vertebral arteries: Left dominant. Normal. Superior cerebellar arteries: Normal. Inferior cerebellar arteries: Right PICA not clearly seen. IMPRESSION: 1. No acute intracranial abnormality. 2. Severe atrophy with findings of chronic ischemic microangiopathy. 3. Unchanged severe stenosis of the distal cavernous segment of the left internal carotid artery. 4. Unchanged moderate right M1 MCA segment narrowing. Electronically Signed   By: Ulyses Jarred M.D.   On: 07/31/2018 05:24   Ct Head Code Stroke Wo Contrast  Result Date: 07/30/2018 CLINICAL DATA:  Code stroke. Left eye vision loss and facial numbness. EXAM: CT HEAD WITHOUT CONTRAST TECHNIQUE: Contiguous axial images were obtained from the base of the skull through the vertex without intravenous contrast. COMPARISON:  12/28/2017 head CT FINDINGS: Brain: There is no mass, hemorrhage or extra-axial collection. There is generalized brain atrophy greater than expected at this age. There is hypoattenuation of the periventricular white matter, most commonly indicating  chronic ischemic microangiopathy. Vascular: Atherosclerotic calcification of the internal carotid arteries at the skull base. No abnormal hyperdensity of the major intracranial arteries or dural venous sinuses. Skull: The visualized skull base, calvarium and extracranial soft tissues are normal. Sinuses/Orbits: No fluid levels or advanced mucosal thickening  of the visualized paranasal sinuses. No mastoid or middle ear effusion. The orbits are normal. ASPECTS [facility] Stroke Program Early CT Score) - Ganglionic level infarction (caudate, lentiform nuclei, internal capsule, insula, M1-M3 cortex): 7 - Supraganglionic infarction (M4-M6 cortex): 3 Total score (0-10 with 10 being normal): 10 IMPRESSION: 1. No acute hemorrhage. 2. Advanced atrophy and chronic microvascular ischemia. 3. ASPECTS is 10. These results were communicated to Dr. Kerney Elbe at 10:42 pm on 07/30/2018 by text page via the [facility] messaging system. Electronically Signed   By: Ulyses Jarred M.D.   On: 07/30/2018 22:43    TTE  - Normal left ventricular function with EF 60-65%. Aortic valve   sclerosis with mild aortic stenosis, mean systolic gradient 9   mmHg. Calcified mitral annulus with trivial regurgitation. Normal   IVC size and collapsibility.   PHYSICAL EXAM  Temp:  [97.8 F (36.6 C)-98.5 F (36.9 C)] 97.8 F (36.6 C) (11/05 0243) Pulse Rate:  [57-117] 74 (11/05 1300) Resp:  [14-19] 16 (11/05 1300) BP: (110-177)/(7-65) 134/55 (11/05 0600) SpO2:  [81 %-100 %] 96 % (11/05 1300)  General - Well nourished, well developed, in no apparent distress.  Ophthalmologic - fundi not visualized due to noncooperation.  Cardiovascular - Regular rate and rhythm.  Mental Status -  Level of arousal and orientation to time, place, and person were intact. Language including expression, naming, repetition, comprehension was assessed and found intact. Fund of Knowledge was assessed and was intact.  Cranial Nerves II - XII - II - Visual  field intact OU. Bilateral visual acuity FC. Left subconjunctival hemorrhage III, IV, VI - Extraocular movements intact. V - Facial sensation intact bilaterally. VII - Facial movement intact bilaterally. VIII - Hearing & vestibular intact bilaterally. X - Palate elevates symmetrically. XI - Chin turning & shoulder shrug intact bilaterally. XII - Tongue protrusion intact.  Motor Strength - The patient's strength was symmetrical in all extremities and pronator drift was absent.  Bulk was normal and fasciculations were absent.   Motor Tone - Muscle tone was assessed at the neck and appendages and was normal.  Reflexes - The patient's reflexes were symmetrical in all extremities and she had no pathological reflexes.  Sensory - Light touch, temperature/pinprick were assessed and were symmetrical.    Coordination - The patient had normal movements in the hands with no ataxia or dysmetria. However, she failed to target on nose with left FTN but no ataxia. Resting and action tremor was present bilaterally.  Gait and Station - deferred.   ASSESSMENT/PLAN [name] [name] is a 72 y.o. female with history of intracranial stenosis s/p angioplasty at left ICA siphon, HTN, HLD, COPD, PVD, former smoker admitted for left eye transient blindness and now blurry vision. No tPA given due to OSW.    Left amaurosis fugax - likely due to chronic progressive left ICA cavernous high grade stenosis  Resultant left blurry vision  MRI  No acute infarct  MRA  Left ICA siphon and right M1 chronic severe stenosis  DSA pending  2D Echo  EF 60-65%  LDL 103  HgbA1c 5.1  CRP and ESR negative  SCDs for VTE prophylaxis  aspirin 81 mg daily and clopidogrel 75 mg daily prior to admission, now on aspirin 325 mg daily and clopidogrel 75 mg daily.   Patient counseled to be compliant with her antithrombotic medications  Ongoing aggressive stroke risk factor management  Therapy recommendations:  Pending    Disposition:  Pending   Severe intracranial  stenosis  Has been following with Dr. Estanislado Pandy over the years  02/2007 MRA left siphon stenosis  09/2008 MRI showed left siphon stenosis worsening and the right M1 stenosis 50 to 70%  10/2008 DSA status post angioplasty of left ICA siphon. Also showed right M1 90% stenosis  04/2009 left ICA siphons 30 to 50% stenosis and right M1 75% stenosis -put on aspirin and Plavix  11/2009 -left ICA siphon 50 to 65% stenosis in the right M1 75 to 80% stenosis  06/2012 left ICA siphon 50 to 65% stenosis and right M1 70% stenosis as well as left CCA 70% in the right ICA proximal 50% stenosis.  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  Right ICA 50% proximal, left CCA 50%, right subclavian artery, and the bilateral VA origin stenosis.  History of stroke  01/2006 right MCA and left subcortical multi-focal infarcts, MRA negative  09/2008 left MCA infarcts -MRA showed left siphon worsening stenosis and right M1 50 to 70% stenosis.  Underwent angioplasty of left ICA siphon in 10/2008  Hypertension . Stable  Long term BP goal 130-150 given severe intracranial stenosis  Hyperlipidemia  Home meds:  lipitor   LDL 103, goal < 70  Now on lipitor 40  Continue statin at discharge  Other Stroke Risk Factors  Advanced age  Former Cigarette smoker  PVD  Other Active Problems  COPD  Left eye subconjunctival hemorrahge  Hospital day # 0    Rosalin Hawking, MD PhD Stroke Neurology 07/31/2018 2:03 PM    To contact Stroke Continuity provider, please refer to [email or web address] After hours, contact General Neurology

## 2018-07-31 NOTE — Consult Note (Signed)
NEURO HOSPITALIST CONSULT NOTE   Requestig physician: Dr. Elesa Massed  Reason for Consult: Acute onset of left monocular vision loss in conjunction with subconjunctival hemorrhage of left eye  History obtained from:   Patient and Chart     HPI:                                                                                                                                          [name] [name] is an 72 y.o. female who presents to [facility] for evaluation of left monocular vision loss. She states it occurred suddenly with left eye going completely black. Right eye vision is normal. She was in [facility] when a family member noticed that there was new region of blood along the white of her left eye. There was some associated left eye discomfort per patient. For that reason they decided to drive her to the ED. On the way, a family member asked her to cover up her right eye to assess left eye vision, at which point the patient stated that she could not see anything out of her left eye. On arrival to the ED, a Code Stroke was called for possible amaurosis fugax secondary to embolic phenomenon. Of note, she also complained of left cheek and LUE numbness. This changed after CT exam to RUE numbness per EDP when she examined the patient.   Her PMHx includes  COPD on oxygen at night, HTN, HLD, carotid stenosis and previous strokes x 3 with residual right hemiparesis s/p right MCA balloon angioplasty. She also has a history of high-grade stenosis of the left internal carotid artery cavernous segment, for which she saw Dr. Corliss Skains on 07/27/18. At that time the neuroimaging findings were felt to be relatively stable per Dr. Corliss Skains and it was decided to continue with conservative management and repeat CTA in 6 months.   Past Medical History:  Diagnosis Date  . Aneurysm (HCC)   . Anxiety   . Anxiety and depression   . Carotid stenosis    R 50% (12/2012)  . Concussion 08/03/2015  . COPD (chronic  obstructive pulmonary disease) (HCC) 12/2012   spirometry: Pre: FVC 84%, FEV1 69%, ratio 0.64 consistent with moderate obstruction.  . Depression   . Fall 08/03/2015   d/c home health 08/2015  . Fracture of cervical vertebra, C5 (HCC) 08/06/2015  . History of chicken pox   . Hyperlipidemia   . Hypertension   . Lower back pain    h/o HNP s/p surgery  . Osteoarthritis    h/o ruptured disc s/p ESI  . Osteoporosis 11/2010   DEXA -2.7 spine, thoracic compression fracture  . Peripheral vascular disease (HCC)   . Smoker    quit 10/2012  . Stroke [facility]) 2010   x3 with residual  R hemiparesis, s/p R MCA balloon angioplasty (2010)    Past Surgical History:  Procedure Laterality Date  . APPENDECTOMY  1960  . CATARACT EXTRACTION     bilateral  . CESAREAN SECTION    . CHOLECYSTECTOMY  1970  . COLONOSCOPY  2004   diverticulosis, no polyps Jarold Motto)  . COLONOSCOPY  05/2016   decreased sphincter tone, diverticulosis, no f/u recommended ([place])  . DEXA  11/2010   T -2.7 spine, -1.9 hip  . HIP SURGERY Left 2006   fractured - screws placed  . IR RADIOLOGIST EVAL & MGMT  01/09/2018  . KYPHOPLASTY  10/02/2017   Procedure: LUMBAR FOUR KYPHOPLASTY;  Surgeon: Coletta Memos, MD;  Location: [facility] OR;  Service: Neurosurgery;;  . RADIOLOGY WITH ANESTHESIA N/A 11/11/2015   Procedure: RADIOLOGY WITH ANESTHESIA;  Surgeon: Julieanne Cotton, MD;  Location: [facility] OR;  Service: Radiology;  Laterality: N/A;    Family History  Problem Relation Age of Onset  . CAD Mother        MI  . Cancer Mother        cervical  . CAD Maternal Aunt        MI  . CAD Sister        MI  . Sudden death Father 75       died in his sleep, chain smoker  . Cirrhosis Father   . Hypertension Daughter   . Diabetes Maternal Aunt   . Cancer Maternal Grandmother        cervical              Social History:  reports that she quit smoking about 5 years ago. Her smoking use included cigarettes. She has a 92.00 pack-year smoking history.  She has never used smokeless tobacco. She reports that she does not drink alcohol or use drugs.  Allergies  Allergen Reactions  . Doxycycline Other (See Comments)    Sig GI upset    MEDICATIONS:                                                                                                                      Current Facility-Administered Medications for the 07/30/18 encounter [facility] Encounter)  Medication  . 0.9 %  sodium chloride infusion   Current Meds  Medication Sig  . acetaminophen (TYLENOL) 500 MG tablet Take 500 mg by mouth 2 (two) times daily as needed for mild pain.   [name] albuterol (PROVENTIL HFA;VENTOLIN HFA) 108 (90 Base) MCG/ACT inhaler Inhale 2 puffs into the lungs every 6 (six) hours as needed for wheezing or shortness of breath.  [name] alendronate (FOSAMAX) 70 MG tablet TAKE 1 TABLET WEEKLY  WITH A FULL GLASS OF WATER ON AN EMPTY STOMACH (Patient taking differently: Take 70 mg by mouth once a week. On Tuesday)  . aspirin EC 81 MG tablet Take 81 mg by mouth daily.  [name] atorvastatin (LIPITOR) 40 MG tablet TAKE 1 TABLET (40 MG TOTAL) BY MOUTH DAILY.  . benazepril (LOTENSIN) 5 MG  tablet Take 1 tablet (5 mg total) by mouth daily.  . Calcium Carb-Cholecalciferol (CALCIUM-VITAMIN D) 600-400 MG-UNIT TABS Take 1 tablet by mouth daily.  . Cholecalciferol (VITAMIN D PO) Take 1 capsule by mouth daily.  . clopidogrel (PLAVIX) 75 MG tablet TAKE 1 TABLET EVERY DAY (Patient taking differently: Take 75 mg by mouth daily. )  . clotrimazole-betamethasone (LOTRISONE) cream Apply 1 application topically 2 (two) times daily.  . fexofenadine (ALLEGRA) 180 MG tablet Take 180 mg by mouth daily as needed for allergies.   . fluconazole (DIFLUCAN) 150 MG tablet Take 1 tablet (150 mg total) by mouth once a week.  . fluticasone-salmeterol (ADVAIR HFA) 115-21 MCG/ACT inhaler Inhale 2 puffs into the lungs 2 (two) times daily. Rinse mouth after use.  . furosemide (LASIX) 20 MG tablet Take 1 tablet (20 mg  total) by mouth as needed for fluid or edema. May take once or twice a week for swelling  . levothyroxine (SYNTHROID, LEVOTHROID) 50 MCG tablet Take 1 tablet (50 mcg total) by mouth daily before breakfast.  . nystatin (MYCOSTATIN/NYSTOP) powder Apply topically 3 (three) times daily.  . Potassium Chloride ER 20 MEQ TBCR Take 20 mEq by mouth as needed (whenever you take lasix).  [name] sertraline (ZOLOFT) 100 MG tablet Take 1 tablet (100 mg total) by mouth daily.  . traMADol (ULTRAM) 50 MG tablet Take 1-2 tablets (50-100 mg total) by mouth 3 (three) times daily as needed for moderate pain.  . traZODone (DESYREL) 100 MG tablet Take 1 tablet (100 mg total) by mouth at bedtime.     ROS:                                                                                                                                       The patient denies headache, neck pain, CP, SOB or limb pain. Other ROS as per HPI.  Blood pressure (!) 151/65, pulse 66, temperature 98.5 F (36.9 C), temperature source Oral, resp. rate 16, SpO2 97 %.  General Examination:                                                                                                      Physical Exam  HEENT-  [place]/AT Lungs- Respirations unlabored Extremities- Warm and well perfused.  Neurological Examination Mental Status: Alert, fully oriented, thought content appropriate.  Speech fluent with intact comprehension, repetition and naming. Able to follow a 2-step directional command without difficulty. Cranial Nerves: II: Visual fields intact all 4 quadrants of  right eye. Mild constriction of peripheral visual fields of left eye with no field cut; intact central vision; able to count fingers, describe a picture and read large type on examiner's badge OS.   III,IV, VI: No ptosis. EOMI without nystagmus.  V,VII: No facial droop. Temp sensation equal bilaterally.  VIII: hearing intact to voice IX,X: Palate rises symmetrically XI: Symmetric shoulder  shrug XII: Midline tongue extension Motor: Right : Upper extremity   5/5    Left:     Upper extremity   4+/5  Lower extremity   5/5     Lower extremity   4+/5 Normal tone throughout; no atrophy noted No pronator drift.  Sensory: Temp and light touch intact throughout, bilaterally. No extinction to DSS.  Deep Tendon Reflexes: 2+ and symmetric throughout Plantars: Right: downgoing   Left: equivocal Cerebellar: No ataxia with FNF or H-S bilaterally Gait: Deferred   Lab Results: Basic Metabolic Panel: Recent Labs  Lab 07/30/18 2223 07/30/18 2229  NA 138 141  K 3.9 3.9  CL 107 107  CO2 24  --   GLUCOSE 137* 136*  BUN 17 21  CREATININE 1.02* 1.00  CALCIUM 8.9  --     CBC: Recent Labs  Lab 07/30/18 2223 07/30/18 2229  WBC 5.9  --   NEUTROABS 3.7  --   HGB 12.9 12.9  HCT 42.1 38.0  MCV 95.5  --   PLT 118*  --     Cardiac Enzymes: No results for input(s): CKTOTAL, CKMB, CKMBINDEX, TROPONINI in the last 168 hours.  Lipid Panel: No results for input(s): CHOL, TRIG, HDL, CHOLHDL, VLDL, LDLCALC in the last 168 hours.  Imaging: Ct Head Code Stroke Wo Contrast  Result Date: 07/30/2018 CLINICAL DATA:  Code stroke. Left eye vision loss and facial numbness. EXAM: CT HEAD WITHOUT CONTRAST TECHNIQUE: Contiguous axial images were obtained from the base of the skull through the vertex without intravenous contrast. COMPARISON:  12/28/2017 head CT FINDINGS: Brain: There is no mass, hemorrhage or extra-axial collection. There is generalized brain atrophy greater than expected at this age. There is hypoattenuation of the periventricular white matter, most commonly indicating chronic ischemic microangiopathy. Vascular: Atherosclerotic calcification of the internal carotid arteries at the skull base. No abnormal hyperdensity of the major intracranial arteries or dural venous sinuses. Skull: The visualized skull base, calvarium and extracranial soft tissues are normal. Sinuses/Orbits: No fluid  levels or advanced mucosal thickening of the visualized paranasal sinuses. No mastoid or middle ear effusion. The orbits are normal. ASPECTS [facility] Stroke Program Early CT Score) - Ganglionic level infarction (caudate, lentiform nuclei, internal capsule, insula, M1-M3 cortex): 7 - Supraganglionic infarction (M4-M6 cortex): 3 Total score (0-10 with 10 being normal): 10 IMPRESSION: 1. No acute hemorrhage. 2. Advanced atrophy and chronic microvascular ischemia. 3. ASPECTS is 10. These results were communicated to Dr. Caryl Pina at 10:42 pm on 07/30/2018 by text page via the [facility] messaging system. Electronically Signed   By: Deatra Robinson M.D.   On: 07/30/2018 22:43    Assessment: 72 year old female presenting with acute onset of left monocular vision loss and left face sensory numbness 1. Vision in left eye has improved. Able to read large type, count fingers and fixate normally OS. Pupils are equally reactive without RAPD. Also noted on exam is mild left sided weakness which patient states is chronic from old stroke.  2. Although a process intrinsic to the left eye is felt to be highest on the DDx, amaurosis fugax from  distal embolization with carotid artery or cardiac source is also on the DDx.  3. History of high-grade stenosis of the left internal carotid artery cavernous segment. She recently saw Dr. Corliss Skains for this.  4. History of three previous strokes with residual right hemiparesis, s/p right MCA balloon angioplasty.  Recommendations: 1. MRI brain, MRA head, carotid ultrasound and TTE.  2. Ophthalmology consult.   Electronically signed: Dr. Caryl Pina  07/31/2018, 12:01 AM

## 2018-07-31 NOTE — Consult Note (Signed)
Chief Complaint: Patient was seen in consultation today for CVA  Referring Physician(s): Dr. Otelia Limes  Supervising Physician: Julieanne Cotton  Patient Status: [facility] - In-pt  History of Present Illness: [name] [name] is a 72 y.o. female with past medical history of anxiety, depression, COPD, HTN, HLD, PVD who presented to [facility] ED with acute onset of transient vision loss, persistent left facial droop and numbness.   CT Head 07/30/18 showed: 1. No acute hemorrhage. 2. Advanced atrophy and chronic microvascular ischemia. 3. ASPECTS is 10.  MR Head 07/31/18 showed: 1. No acute intracranial abnormality. 2. Severe atrophy with findings of chronic ischemic microangiopathy. 3. Unchanged severe stenosis of the distal cavernous segment of the left internal carotid artery. 4. Unchanged moderate right M1 MCA segment narrowing.  Patient known to [facility] service due to severe stenosis of the left ICA.  She was recently seen in [facility] clinic for routine follow-up at which time conservative management aspirin and plavix was continued as patient had been stable and asymptomatic of her stenosis.  NIR now consulted due to acute symptoms.   Past Medical History:  Diagnosis Date  . Aneurysm (HCC)   . Anxiety   . Anxiety and depression   . Carotid stenosis    R 50% (12/2012)  . Concussion 08/03/2015  . COPD (chronic obstructive pulmonary disease) (HCC) 12/2012   spirometry: Pre: FVC 84%, FEV1 69%, ratio 0.64 consistent with moderate obstruction.  . Depression   . Fall 08/03/2015   d/c home health 08/2015  . Fracture of cervical vertebra, C5 (HCC) 08/06/2015  . History of chicken pox   . Hyperlipidemia   . Hypertension   . Lower back pain    h/o HNP s/p surgery  . Osteoarthritis    h/o ruptured disc s/p ESI  . Osteoporosis 11/2010   DEXA -2.7 spine, thoracic compression fracture  . Peripheral vascular disease (HCC)   . Smoker    quit 10/2012  . Stroke [facility]) 2010   x3 with residual R  hemiparesis, s/p R MCA balloon angioplasty (2010)    Past Surgical History:  Procedure Laterality Date  . APPENDECTOMY  1960  . CATARACT EXTRACTION     bilateral  . CESAREAN SECTION    . CHOLECYSTECTOMY  1970  . COLONOSCOPY  2004   diverticulosis, no polyps Jarold Motto)  . COLONOSCOPY  05/2016   decreased sphincter tone, diverticulosis, no f/u recommended (Danis)  . DEXA  11/2010   T -2.7 spine, -1.9 hip  . HIP SURGERY Left 2006   fractured - screws placed  . IR RADIOLOGIST EVAL & MGMT  01/09/2018  . KYPHOPLASTY  10/02/2017   Procedure: LUMBAR FOUR KYPHOPLASTY;  Surgeon: Coletta Memos, MD;  Location: [facility] OR;  Service: Neurosurgery;;  . RADIOLOGY WITH ANESTHESIA N/A 11/11/2015   Procedure: RADIOLOGY WITH ANESTHESIA;  Surgeon: Julieanne Cotton, MD;  Location: [facility] OR;  Service: Radiology;  Laterality: N/A;    Allergies: Doxycycline  Medications: Prior to Admission medications   Medication Sig Start Date End Date Taking? Authorizing Provider  acetaminophen (TYLENOL) 500 MG tablet Take 500 mg by mouth 2 (two) times daily as needed for mild pain.    Yes [provider]  albuterol (PROVENTIL HFA;VENTOLIN HFA) 108 (90 Base) MCG/ACT inhaler Inhale 2 puffs into the lungs every 6 (six) hours as needed for wheezing or shortness of breath. 11/21/17  Yes Shane Crutch, MD  alendronate (FOSAMAX) 70 MG tablet TAKE 1 TABLET WEEKLY  WITH A FULL GLASS OF WATER ON AN EMPTY  STOMACH Patient taking differently: Take 70 mg by mouth once a week. On Tuesday 10/11/16  Yes [name], MD  aspirin EC 81 MG tablet Take 81 mg by mouth daily.   Yes [provider]  atorvastatin (LIPITOR) 40 MG tablet TAKE 1 TABLET (40 MG TOTAL) BY MOUTH DAILY. 12/28/17  Yes [name], MD  benazepril (LOTENSIN) 5 MG tablet Take 1 tablet (5 mg total) by mouth daily. 11/20/17  Yes [name], MD  Calcium Carb-Cholecalciferol (CALCIUM-VITAMIN D) 600-400 MG-UNIT TABS Take 1 tablet by mouth daily.    Yes [provider]  Cholecalciferol (VITAMIN D PO) Take 1 capsule by mouth daily.   Yes [provider]  clopidogrel (PLAVIX) 75 MG tablet TAKE 1 TABLET EVERY DAY Patient taking differently: Take 75 mg by mouth daily.  05/24/18  Yes [name], MD  clotrimazole-betamethasone (LOTRISONE) cream Apply 1 application topically 2 (two) times daily. 07/02/18  Yes [name], MD  fexofenadine (ALLEGRA) 180 MG tablet Take 180 mg by mouth daily as needed for allergies.    Yes [provider]  fluconazole (DIFLUCAN) 150 MG tablet Take 1 tablet (150 mg total) by mouth once a week. 06/24/18  Yes [name], MD  fluticasone-salmeterol (ADVAIR HFA) [phone] MCG/ACT inhaler Inhale 2 puffs into the lungs 2 (two) times daily. Rinse mouth after use. 11/21/17  Yes Shane Crutch, MD  furosemide (LASIX) 20 MG tablet Take 1 tablet (20 mg total) by mouth as needed for fluid or edema. May take once or twice a week for swelling 05/07/18  Yes [name], MD  levothyroxine (SYNTHROID, LEVOTHROID) 50 MCG tablet Take 1 tablet (50 mcg total) by mouth daily before breakfast. 05/19/18  Yes [name], MD  nystatin (MYCOSTATIN/NYSTOP) powder Apply topically 3 (three) times daily. 06/24/18  Yes [name], MD  Potassium Chloride ER 20 MEQ TBCR Take 20 mEq by mouth as needed (whenever you take lasix). 07/02/18  Yes [name], MD  sertraline (ZOLOFT) 100 MG tablet Take 1 tablet (100 mg total) by mouth daily. 07/02/18  Yes [name], MD  traMADol (ULTRAM) 50 MG tablet Take 1-2 tablets (50-100 mg total) by mouth 3 (three) times daily as needed for moderate pain. 05/07/18  Yes [name], MD  traZODone (DESYREL) 100 MG tablet Take 1 tablet (100 mg total) by mouth at bedtime. 07/02/18  Yes [name], MD     Family History  Problem Relation Age of Onset  . CAD Mother        MI  . Cancer Mother        cervical  . CAD Maternal Aunt         MI  . CAD Sister        MI  . Sudden death Father 80       died in his sleep, chain smoker  . Cirrhosis Father   . Hypertension Daughter   . Diabetes Maternal Aunt   . Cancer Maternal Grandmother        cervical    Social History   Socioeconomic History  . Marital status: Divorced    Spouse name: Not on file  . Number of children: Not on file  . Years of education: Not on file  . Highest education level: Not on file  Occupational History  . Not on file  Social Needs  . Financial resource strain: Not on file  . Food insecurity:    Worry: Not on file    Inability: Not on file  .  Transportation needs:    Medical: Not on file    Non-medical: Not on file  Tobacco Use  . Smoking status: Former Smoker    Packs/day: 2.00    Years: 46.00    Pack years: 92.00    Types: Cigarettes    Last attempt to quit: 11/08/2012    Years since quitting: 5.7  . Smokeless tobacco: Never Used  Substance and Sexual Activity  . Alcohol use: No    Alcohol/week: 0.0 standard drinks  . Drug use: No  . Sexual activity: Not Currently  Lifestyle  . Physical activity:    Days per week: Not on file    Minutes per session: Not on file  . Stress: Not on file  Relationships  . Social connections:    Talks on phone: Not on file    Gets together: Not on file    Attends religious service: Not on file    Active member of club or organization: Not on file    Attends meetings of clubs or organizations: Not on file    Relationship status: Not on file  Other Topics Concern  . Not on file  Social History Narrative   Lives with granddaughter and great grandchildren   Occupation: retired, was Lawyer   Edu: 8th grade   Activity: no regular activity:   Diet: some water, fruits/vegetables daily     Review of Systems: A 12 point ROS discussed and pertinent positives are indicated in the HPI above.  All other systems are negative.  Review of Systems  Constitutional: Negative for fatigue and fever.  Eyes:  Positive for visual disturbance.  Respiratory: Negative for cough and shortness of breath.   Cardiovascular: Negative for chest pain.  Gastrointestinal: Negative for abdominal pain.  Musculoskeletal: Negative for back pain.  Neurological: Positive for numbness and headaches. Negative for dizziness, facial asymmetry, weakness and light-headedness.  Psychiatric/Behavioral: Negative for behavioral problems and confusion.    Vital Signs: BP (!) 134/55   Pulse 70   Temp 97.8 F (36.6 C)   Resp 15   SpO2 94%   Physical Exam  Constitutional: She is oriented to person, place, and time. She appears well-developed. No distress.  Cardiovascular: Normal rate, regular rhythm and normal heart sounds. Exam reveals no gallop and no friction rub.  No murmur heard. Pulmonary/Chest: Effort normal and breath sounds normal. No respiratory distress.  Abdominal: Soft.  Neurological: She is alert and oriented to person, place, and time.  Skin: Skin is warm and dry. She is not diaphoretic.  Psychiatric: She has a normal mood and affect. Her behavior is normal. Judgment and thought content normal.  Nursing note and vitals reviewed.        Imaging: Ct Angio Head W Or Wo Contrast  Result Date: 07/18/2018 CLINICAL DATA:  Followup intracranial and extracranial arterial stenoses. EXAM: CT ANGIOGRAPHY HEAD AND NECK TECHNIQUE: Multidetector CT imaging of the head and neck was performed using the standard protocol during bolus administration of intravenous contrast. Multiplanar CT image reconstructions and MIPs were obtained to evaluate the vascular anatomy. Carotid stenosis measurements (when applicable) are obtained utilizing NASCET criteria, using the distal internal carotid diameter as the denominator. CONTRAST:  50mL ISOVUE-370 IOPAMIDOL (ISOVUE-370) INJECTION 76% COMPARISON:  12/28/2017 FINDINGS: CT HEAD FINDINGS Brain: There is no evidence of acute infarct, intracranial hemorrhage, mass, midline shift, or  extra-axial fluid collection. Advanced atrophy is again noted in the parietal greater than frontal lobes bilaterally, unchanged. Patchy cerebral white matter hypodensities are unchanged and  nonspecific but compatible with mild chronic small vessel ischemic disease. Vascular: Calcified atherosclerosis at the skull base. Skull: No fracture or focal osseous lesion. Sinuses: Clear paranasal sinuses and mastoid air cells. Orbits: Bilateral cataract extraction. Review of the MIP images confirms the above findings CTA NECK FINDINGS Aortic arch: Standard 3 vessel aortic arch with moderate calcified and soft plaque. Calcified and soft plaque at the right subclavian artery origin with unchanged severe stenosis. Patent left subclavian artery with diffuse atherosclerosis but no significant proximal stenosis. Right carotid system: Patent common carotid artery with limited assessment of its origin due to motion artifact though with suspected at least mild stenosis. Additional calcified and soft plaque in the mid and distal common carotid artery resulting in up to mild stenosis. Extensive calcified plaque at the carotid bifurcation results in 50% ICA origin stenosis, unchanged. Left carotid system: Patent common carotid artery with primarily soft plaque resulting in up to 55% stenosis, not significantly changed though better demonstrated on today's study. 50% stenosis of the mid common carotid artery associated with a short dissection flap/web or focus of plaque ulceration is unchanged. Predominantly calcified plaque in the carotid bulb results in less than 50% narrowing, unchanged. Vertebral arteries: The vertebral arteries are patent with the left being strongly dominant. Severe right and moderate left vertebral artery origin stenoses are unchanged. Skeleton: Mild-to-moderate lower cervical disc degeneration. Other neck: No evidence of acute abnormality or mass. Upper chest: Mild centrilobular emphysema. Small amount of material  in the right aspect of the trachea near the thoracic inlet with a different configuration than on the prior study favored to represent secretions. Review of the MIP images confirms the above findings CTA HEAD FINDINGS Anterior circulation: The internal carotid arteries are patent from skull base to carotid termini with similar appearance of extensive atherosclerosis bilaterally including severe distal left cavernous and moderate right anterior genu stenoses. Focally ectasia of the right cavernous ICA is unchanged. ACAs and MCAs are patent with stable to mildly progressive severe tandem right M1 stenoses. Posterior circulation: The intracranial vertebral arteries are widely patent to the basilar. Patent left PICA, bilateral AICA, and bilateral SCA origins are visualized. The basilar artery is widely patent. Posterior communicating arteries are not identified. The PCAs are patent with atherosclerotic irregularity bilaterally and mild right and severe left P2 stenoses, stable to slightly progressed. No aneurysm is identified. Venous sinuses: Patent. Anatomic variants: None. Delayed phase: No abnormal enhancement. Review of the MIP images confirms the above findings IMPRESSION: 1. Advanced intracranial atherosclerosis including unchanged severe left cavernous ICA stenosis and stable to slight progression of severe right M1 and left P2 stenoses. 2. Unchanged atherosclerosis in the neck including 50% right ICA origin stenosis, 50-55% left common carotid artery stenoses, severe right subclavian artery origin stenosis, and severe right and moderate left vertebral artery origin stenoses. 3. No evidence of acute intracranial abnormality. 4. Unchanged frontoparietal atrophy. 5. Aortic Atherosclerosis (ICD10-I70.0) and Emphysema (ICD10-J43.9). Electronically Signed   By: Sebastian Ache M.D.   On: 07/18/2018 18:14   Ct Angio Neck W Or Wo Contrast  Result Date: 07/18/2018 CLINICAL DATA:  Followup intracranial and extracranial  arterial stenoses. EXAM: CT ANGIOGRAPHY HEAD AND NECK TECHNIQUE: Multidetector CT imaging of the head and neck was performed using the standard protocol during bolus administration of intravenous contrast. Multiplanar CT image reconstructions and MIPs were obtained to evaluate the vascular anatomy. Carotid stenosis measurements (when applicable) are obtained utilizing NASCET criteria, using the distal internal carotid diameter as the denominator. CONTRAST:  50mL ISOVUE-370 IOPAMIDOL (ISOVUE-370) INJECTION 76% COMPARISON:  12/28/2017 FINDINGS: CT HEAD FINDINGS Brain: There is no evidence of acute infarct, intracranial hemorrhage, mass, midline shift, or extra-axial fluid collection. Advanced atrophy is again noted in the parietal greater than frontal lobes bilaterally, unchanged. Patchy cerebral white matter hypodensities are unchanged and nonspecific but compatible with mild chronic small vessel ischemic disease. Vascular: Calcified atherosclerosis at the skull base. Skull: No fracture or focal osseous lesion. Sinuses: Clear paranasal sinuses and mastoid air cells. Orbits: Bilateral cataract extraction. Review of the MIP images confirms the above findings CTA NECK FINDINGS Aortic arch: Standard 3 vessel aortic arch with moderate calcified and soft plaque. Calcified and soft plaque at the right subclavian artery origin with unchanged severe stenosis. Patent left subclavian artery with diffuse atherosclerosis but no significant proximal stenosis. Right carotid system: Patent common carotid artery with limited assessment of its origin due to motion artifact though with suspected at least mild stenosis. Additional calcified and soft plaque in the mid and distal common carotid artery resulting in up to mild stenosis. Extensive calcified plaque at the carotid bifurcation results in 50% ICA origin stenosis, unchanged. Left carotid system: Patent common carotid artery with primarily soft plaque resulting in up to 55%  stenosis, not significantly changed though better demonstrated on today's study. 50% stenosis of the mid common carotid artery associated with a short dissection flap/web or focus of plaque ulceration is unchanged. Predominantly calcified plaque in the carotid bulb results in less than 50% narrowing, unchanged. Vertebral arteries: The vertebral arteries are patent with the left being strongly dominant. Severe right and moderate left vertebral artery origin stenoses are unchanged. Skeleton: Mild-to-moderate lower cervical disc degeneration. Other neck: No evidence of acute abnormality or mass. Upper chest: Mild centrilobular emphysema. Small amount of material in the right aspect of the trachea near the thoracic inlet with a different configuration than on the prior study favored to represent secretions. Review of the MIP images confirms the above findings CTA HEAD FINDINGS Anterior circulation: The internal carotid arteries are patent from skull base to carotid termini with similar appearance of extensive atherosclerosis bilaterally including severe distal left cavernous and moderate right anterior genu stenoses. Focally ectasia of the right cavernous ICA is unchanged. ACAs and MCAs are patent with stable to mildly progressive severe tandem right M1 stenoses. Posterior circulation: The intracranial vertebral arteries are widely patent to the basilar. Patent left PICA, bilateral AICA, and bilateral SCA origins are visualized. The basilar artery is widely patent. Posterior communicating arteries are not identified. The PCAs are patent with atherosclerotic irregularity bilaterally and mild right and severe left P2 stenoses, stable to slightly progressed. No aneurysm is identified. Venous sinuses: Patent. Anatomic variants: None. Delayed phase: No abnormal enhancement. Review of the MIP images confirms the above findings IMPRESSION: 1. Advanced intracranial atherosclerosis including unchanged severe left cavernous ICA  stenosis and stable to slight progression of severe right M1 and left P2 stenoses. 2. Unchanged atherosclerosis in the neck including 50% right ICA origin stenosis, 50-55% left common carotid artery stenoses, severe right subclavian artery origin stenosis, and severe right and moderate left vertebral artery origin stenoses. 3. No evidence of acute intracranial abnormality. 4. Unchanged frontoparietal atrophy. 5. Aortic Atherosclerosis (ICD10-I70.0) and Emphysema (ICD10-J43.9). Electronically Signed   By: Sebastian Ache M.D.   On: 07/18/2018 18:14   Mr Brain Wo Contrast  Result Date: 07/31/2018 CLINICAL DATA:  Left eye vision loss EXAM: MRI HEAD WITHOUT CONTRAST MRA HEAD WITHOUT CONTRAST TECHNIQUE: Multiplanar, multiecho pulse sequences  of the brain and surrounding structures were obtained without intravenous contrast. Angiographic images of the head were obtained using MRA technique without contrast. COMPARISON:  Head CT 07/30/2018 FINDINGS: MRI HEAD FINDINGS BRAIN: There is no acute infarct, acute hemorrhage or mass effect. The midline structures are normal. There are no old infarcts. Multifocal white matter hyperintensity, most commonly due to chronic ischemic microangiopathy. Advanced atrophy for age. Foci of chronic microhemorrhage in the right frontal and parietal lobes. SKULL AND UPPER CERVICAL SPINE: The visualized skull base, calvarium, upper cervical spine and extracranial soft tissues are normal. SINUSES/ORBITS: No fluid levels or advanced mucosal thickening. No mastoid or middle ear effusion. The orbits are normal. MRA HEAD FINDINGS Intracranial internal carotid arteries: Unchanged severe narrowing of the distal cavernous segment of the left ICA. Anterior cerebral arteries: Normal. Middle cerebral arteries: Unchanged moderate right M1 narrowing. Normal left MCA. Posterior communicating arteries: Present bilaterally. Posterior cerebral arteries: No proximal occlusion. Basilar artery: Normal. Vertebral  arteries: Left dominant. Normal. Superior cerebellar arteries: Normal. Inferior cerebellar arteries: Right PICA not clearly seen. IMPRESSION: 1. No acute intracranial abnormality. 2. Severe atrophy with findings of chronic ischemic microangiopathy. 3. Unchanged severe stenosis of the distal cavernous segment of the left internal carotid artery. 4. Unchanged moderate right M1 MCA segment narrowing. Electronically Signed   By: Deatra Robinson M.D.   On: 07/31/2018 05:24   Mr Maxine Glenn Head Wo Contrast  Result Date: 07/31/2018 CLINICAL DATA:  Left eye vision loss EXAM: MRI HEAD WITHOUT CONTRAST MRA HEAD WITHOUT CONTRAST TECHNIQUE: Multiplanar, multiecho pulse sequences of the brain and surrounding structures were obtained without intravenous contrast. Angiographic images of the head were obtained using MRA technique without contrast. COMPARISON:  Head CT 07/30/2018 FINDINGS: MRI HEAD FINDINGS BRAIN: There is no acute infarct, acute hemorrhage or mass effect. The midline structures are normal. There are no old infarcts. Multifocal white matter hyperintensity, most commonly due to chronic ischemic microangiopathy. Advanced atrophy for age. Foci of chronic microhemorrhage in the right frontal and parietal lobes. SKULL AND UPPER CERVICAL SPINE: The visualized skull base, calvarium, upper cervical spine and extracranial soft tissues are normal. SINUSES/ORBITS: No fluid levels or advanced mucosal thickening. No mastoid or middle ear effusion. The orbits are normal. MRA HEAD FINDINGS Intracranial internal carotid arteries: Unchanged severe narrowing of the distal cavernous segment of the left ICA. Anterior cerebral arteries: Normal. Middle cerebral arteries: Unchanged moderate right M1 narrowing. Normal left MCA. Posterior communicating arteries: Present bilaterally. Posterior cerebral arteries: No proximal occlusion. Basilar artery: Normal. Vertebral arteries: Left dominant. Normal. Superior cerebellar arteries: Normal. Inferior  cerebellar arteries: Right PICA not clearly seen. IMPRESSION: 1. No acute intracranial abnormality. 2. Severe atrophy with findings of chronic ischemic microangiopathy. 3. Unchanged severe stenosis of the distal cavernous segment of the left internal carotid artery. 4. Unchanged moderate right M1 MCA segment narrowing. Electronically Signed   By: Deatra Robinson M.D.   On: 07/31/2018 05:24   Ct Head Code Stroke Wo Contrast  Result Date: 07/30/2018 CLINICAL DATA:  Code stroke. Left eye vision loss and facial numbness. EXAM: CT HEAD WITHOUT CONTRAST TECHNIQUE: Contiguous axial images were obtained from the base of the skull through the vertex without intravenous contrast. COMPARISON:  12/28/2017 head CT FINDINGS: Brain: There is no mass, hemorrhage or extra-axial collection. There is generalized brain atrophy greater than expected at this age. There is hypoattenuation of the periventricular white matter, most commonly indicating chronic ischemic microangiopathy. Vascular: Atherosclerotic calcification of the internal carotid arteries at the skull base. No  abnormal hyperdensity of the major intracranial arteries or dural venous sinuses. Skull: The visualized skull base, calvarium and extracranial soft tissues are normal. Sinuses/Orbits: No fluid levels or advanced mucosal thickening of the visualized paranasal sinuses. No mastoid or middle ear effusion. The orbits are normal. ASPECTS [facility] Stroke Program Early CT Score) - Ganglionic level infarction (caudate, lentiform nuclei, internal capsule, insula, M1-M3 cortex): 7 - Supraganglionic infarction (M4-M6 cortex): 3 Total score (0-10 with 10 being normal): 10 IMPRESSION: 1. No acute hemorrhage. 2. Advanced atrophy and chronic microvascular ischemia. 3. ASPECTS is 10. These results were communicated to Dr. Caryl Pina at 10:42 pm on 07/30/2018 by text page via the [facility] messaging system. Electronically Signed   By: Deatra Robinson M.D.   On: 07/30/2018 22:43     Labs:  CBC: Recent Labs    11/16/17 0450 01/30/18 1006 05/07/18 1434 07/30/18 2223 07/30/18 2229  WBC 5.8 7.6 5.9 5.9  --   HGB 12.1 12.6 12.8 12.9 12.9  HCT 38.3 37.7 38.2 42.1 38.0  PLT 183 161.0 171.0 118*  --     COAGS: Recent Labs    07/30/18 2223  INR 1.02  APTT 27    BMP: Recent Labs    11/14/17 0532 11/15/17 0633 11/16/17 0450 01/30/18 1006  05/07/18 1434 06/27/18 1042 07/30/18 2223 07/30/18 2229  NA  --  139 138 136  --  136 139 138 141  K  --  4.3 4.3 5.8*   < > 4.8 5.2* 3.9 3.9  CL  --  102 103 102  --  101 103 107 107  CO2  --  26 24 29   --  30 29 24   --   GLUCOSE  --  110* 103* 82  --  97 94 137* 136*  BUN  --  18 17 16   --  26* 21 17 21   CALCIUM  --  9.2 9.1 10.4  --  9.9 9.9 8.9  --   CREATININE 1.00 0.89 0.87 0.86  --  1.06 1.09 1.02* 1.00  GFRNONAA 55* >60 >60  --   --   --   --  54*  --   GFRAA >60 >60 >60  --   --   --   --  >60  --    < > = values in this interval not displayed.    LIVER FUNCTION TESTS: Recent Labs    01/30/18 1006 07/30/18 2223  BILITOT 0.4 0.6  AST 21 21  ALT 11 11  ALKPHOS 100 106  PROT 7.6 6.9  ALBUMIN 4.1 3.6    TUMOR MARKERS: No results for input(s): AFPTM, CEA, CA199, CHROMGRNA in the last 8760 hours.  Assessment and Plan: Stroke vs. TIA Left-sided facial droop, facial numbness, vision loss Imaging has thus far been negative for acute intracranial abnormality. She does, however, have known stenosis.  Patient evaluated in the ED by Dr. Corliss Skains. Dr. Roda Shutters also present during visit.  Symptoms are improving.  She continues with blurry vision of the left eye.  Plan for diagnostic angiogram tomorrow.   Risks and benefits were discussed with the patient including, but not limited to bleeding, infection, vascular injury or contrast induced renal failure.  This interventional procedure involves the use of X-rays and because of the nature of the planned procedure, it is possible that we will have  prolonged use of X-ray fluoroscopy.  Potential radiation risks to you include (but are not limited to) the following: - A slightly elevated risk  for cancer  several years later in life. This risk is typically less than 0.5% percent. This risk is low in comparison to the normal incidence of human cancer, which is 33% for women and 50% for men according to the [employer]. - Radiation induced injury can include skin redness, resembling a rash, tissue breakdown / ulcers and hair loss (which can be temporary or permanent).   The likelihood of either of these occurring depends on the difficulty of the procedure and whether you are sensitive to radiation due to previous procedures, disease, or genetic conditions.   IF your procedure requires a prolonged use of radiation, you will be notified and given written instructions for further action.  It is your responsibility to monitor the irradiated area for the 2 weeks following the procedure and to notify your physician if you are concerned that you have suffered a radiation induced injury.    All of the patient's questions were answered, patient is agreeable to proceed.  Consent signed and in chart.  Thank you for this interesting consult.  I greatly enjoyed meeting [name] [name] and look forward to participating in their care.  A copy of this report was sent to the requesting provider on this date.  Electronically Signed: Hoyt Koch, PA 07/31/2018, 1:07 PM   I spent a total of 40 Minutes    in face to face in clinical consultation, greater than 50% of which was counseling/coordinating care for blurry vision, facial numbness.

## 2018-07-31 NOTE — Progress Notes (Signed)
  Echocardiogram 2D Echocardiogram has been performed.  [name] [name] [name] [name] 07/31/2018, 9:28 AM

## 2018-07-31 NOTE — Evaluation (Signed)
Physical Therapy Evaluation Patient Details Name: [name] [name] MRN: [id number] DOB: 1945/10/19 Today's Date: 07/31/2018   History of Present Illness  Pt is a 72 y/o female admitted secondary to L eye vision loss, and L facial numbness. Pt also with broken blood vessel in eye. Pt reports symptoms have since resolved. MRI negative for acute abnormality. PMH includes HTN, COPD, PVD, CVA with residual R weakness, and s/p kyphoplasty.   Clinical Impression  Pt admitted secondary to problem above with deficits below. Pt requiring min to min guard A for mobility this session. Mild unsteadiness noted and educated pt to use RW for mobility at home to increase safety. Anticipate pt will progress well and pt will have support from daughters upon d/c home. Will continue to follow acutely to maximize functional mobility independence and safety.     Follow Up Recommendations Home health PT;Supervision for mobility/OOB    Equipment Recommendations  None recommended by PT    Recommendations for Other Services       Precautions / Restrictions Precautions Precautions: Fall Restrictions Weight Bearing Restrictions: No      Mobility  Bed Mobility Overal bed mobility: Needs Assistance Bed Mobility: Supine to Sit;Sit to Supine     Supine to sit: Supervision Sit to supine: Supervision   General bed mobility comments: Supervision and increased time for safety.   Transfers Overall transfer level: Needs assistance Equipment used: None Transfers: Sit to/from Stand Sit to Stand: Min guard;Min assist         General transfer comment: Performed standing X2-3 throughout session for linen change and then to ambulate. Min A for lift assist and steadying initially, and then was able to progress to min guard for steadying assist.   Ambulation/Gait Ambulation/Gait assistance: Min guard Gait Distance (Feet): 10 Feet Assistive device: None Gait Pattern/deviations: Step-through pattern;Decreased  stride length Gait velocity: Decreased    General Gait Details: Slow, cautious, mildly unsteady gait. Min guard for steadying assist. Distance limited to within the room as pt in ED and connected to monitors. Educated to use RW at home to increase stability.   Stairs            Wheelchair Mobility    Modified Rankin (Stroke Patients Only)       Balance Overall balance assessment: Needs assistance Sitting-balance support: No upper extremity supported;Feet supported Sitting balance-Leahy Scale: Good     Standing balance support: No upper extremity supported;During functional activity Standing balance-Leahy Scale: Fair                               Pertinent Vitals/Pain Pain Assessment: No/denies pain    Home Living Family/patient expects to be discharged to:: Private residence Living Arrangements: Children Available Help at Discharge: Family;Available 24 hours/day Type of Home: House Home Access: Level entry     Home Layout: Two level Home Equipment: Walker - 2 wheels;Bedside commode;Shower seat      Prior Function Level of Independence: Needs assistance   Gait / Transfers Assistance Needed: Reports she did not use AD for ambulation, however, daughter reports she has been instructed to use RW.   ADL's / Homemaking Assistance Needed: Daughter assists with bathing.         Hand Dominance   Dominant Hand: Right    Extremity/Trunk Assessment   Upper Extremity Assessment Upper Extremity Assessment: Defer to OT evaluation    Lower Extremity Assessment Lower Extremity Assessment: RLE deficits/detail RLE Deficits /  Details: RLE grossly 3+/5 throughout. Residual R sided weakness from previous CVA.     Cervical / Trunk Assessment Cervical / Trunk Assessment: Kyphotic  Communication   Communication: No difficulties  Cognition Arousal/Alertness: Awake/alert Behavior During Therapy: WFL for tasks assessed/performed Overall Cognitive Status:  Within Functional Limits for tasks assessed                                        General Comments General comments (skin integrity, edema, etc.): Pt's daughters present during session. Pt did not seem to have any residual visual deficits. Pt could read letters off of PT's badge and stated they looked clear.     Exercises     Assessment/Plan    PT Assessment Patient needs continued PT services  PT Problem List Decreased strength;Decreased balance;Decreased mobility;Decreased knowledge of use of DME       PT Treatment Interventions DME instruction;Gait training;Functional mobility training;Therapeutic activities;Therapeutic exercise;Balance training;Patient/family education;Stair training    PT Goals (Current goals can be found in the Care Plan section)  Acute Rehab PT Goals Patient Stated Goal: to go home  PT Goal Formulation: With patient Time For Goal Achievement: 08/14/18 Potential to Achieve Goals: Good    Frequency Min 3X/week   Barriers to discharge        Co-evaluation               AM-PAC PT "6 Clicks" Daily Activity  Outcome Measure Difficulty turning over in bed (including adjusting bedclothes, sheets and blankets)?: A Little Difficulty moving from lying on back to sitting on the side of the bed? : A Little Difficulty sitting down on and standing up from a chair with arms (e.g., wheelchair, bedside commode, etc,.)?: Unable Help needed moving to and from a bed to chair (including a wheelchair)?: A Little Help needed walking in hospital room?: A Little Help needed climbing 3-5 steps with a railing? : A Lot 6 Click Score: 15    End of Session Equipment Utilized During Treatment: Gait belt Activity Tolerance: Patient tolerated treatment well Patient left: in bed;with call bell/phone within reach;with family/visitor present Nurse Communication: Mobility status PT Visit Diagnosis: Unsteadiness on feet (R26.81);Muscle weakness (generalized)  (M62.81)    Time: 5784-6962 PT Time Calculation (min) (ACUTE ONLY): 24 min   Charges:   PT Evaluation $PT Eval Low Complexity: 1 Low PT Treatments $Therapeutic Activity: 8-22 mins        Gladys Damme, PT, DPT  Acute Rehabilitation Services  Pager: [phone] Office: [phone]   Lehman Prom 07/31/2018, 1:18 PM

## 2018-07-31 NOTE — Progress Notes (Signed)
[facility] PROGRESS NOTE  MARAH PARK XLK:[id number] DOB: October 11, 1945 DOA: 07/30/2018 PCP: [name], MD  Assessment/Plan: Strokelike symptoms/facial  Droop/loss of vision left eye(HCC): Patient has transient vision loss, persistent left facial droop and numbness, concerning for new stroke versus TIA.  CT head is negative for acute intracranial abnormalities. MRI with no acute intracranial abnormality, severe atrophy, unchanged severe stenosis of distal cavernous segment of the left internal carotid artery. IR consult requested per neurology and diagnositic agniogram planned. No events on tele. Echo with EF 60% and grade 1 DD, HgA1c 5.1 lipid panel with LDL 103, LDH 141. - Continue Plavix  - fasting lipid panel and HbA1c  - 2D transthoracic echocardiography  -  PT/OT consult  Left eye vision loss: possibly due to stroke but  DD includes CRAO, BRAO,, BRVO, CRVO; and temporal artery arthritis. CRP and sed rate within limits of normal. Evaluated by neurology who opine process intrinsic to left eye highest on DDx, amaurosis fugax from distal embolization with carotid artery or cardiac source also on list. Patient reports vision back to baseline.  -I called and mad appointment for 11/8 at 10: 30 with Dr Jorje Guild who is dr Deirdre Evener partner as dr Elmer Picker not available that soon.  -see #1  Carotid artery stenosis: History ofhigh-grade stenosis of the left internal carotid artery cavernous segment. She recently saw Dr. Corliss Skains -followed closely by dr Marcheta Grammes -just had visit 11/1 -see #1  HLD: -lipitor  MDD (major depressive disorder), recurrent episode, moderate (HCC): -Continue Zoloft  Essential hypertension: -IV Hydralazine prn for SBP>220 and dBP>120 -hold blood pressure medications to allow permissive hypertension  Hypothyroidism: Last TSH was 2.64 on 06/27/2018 -Continue home Synthroid  Chronic diastolic heart failure (HCC): 2D echo on 11/14/2017 showed EF  55-60% with grade 1 diastolic dysfunction.  Patient does not have leg edema JVD.  No respiratory distress.  CHF is compensated. -Hold Lasix   Acute metabolic encephalopathy: resolved this am. Patient has some mild confusion, but is still oriented x3.  Most likely due to stroke. Nos/sx infections -Frequent neuro check   Thrombocytopenia: Platelets 118.  Etiology is not clear if no bleeding tendency P -see aboove  Intertrigo: -On Diflucan, topical nystatin powder and cream of clotrimazole- betamethasone  Code Status: limited Family Communication: daughter at bedside Disposition Plan: home when ready   Consultants:  [facility]  Procedures:  echo  Antibiotics:  none  HPI/Subjective: pmhx includes copd on oxygen, htn, hld, coratid stenosis followed closely by dr Corliss Skains, stroke with residual rt hemiparesis s/p righ mca balloon angioplasty presents with sudden loss of vision left eye. Initially code stroke called. MRI negative for acute infarct. IR consult and plan for diagnostic angiogram  Today vision restored. Neuro exam at baseline complains of headache  Objective: Vitals:   07/31/18 1045 07/31/18 1300  BP:    Pulse: 70 74  Resp: 15 16  Temp:    SpO2: 94% 96%    Intake/Output Summary (Last 24 hours) at 07/31/2018 1356 Last data filed at 07/31/2018 0816 Gross per 24 hour  Intake -  Output 300 ml  Net -300 ml   There were no vitals filed for this visit.  Exam:   General:  Well nourished complains headache but no acute distress  Cardiovascular: rrr no mgr no LE edema  Respiratory: normal effort BS clear bilaterally no wheeze  Abdomen: non-distended soft +BS no guarding or rebounding  Musculoskeletal: joints without swelling/erythema full rom  Neuro: alert and oriented. Speech clear facial symmetry  PERRL bilateral grip 4/5 LE strenght 4/5 bilaterally  ENT ears without drainage left eye bloody no scleral icterus  Data Reviewed: Basic  Metabolic Panel: Recent Labs  Lab 07/30/18 2223 07/30/18 2229  NA 138 141  K 3.9 3.9  CL 107 107  CO2 24  --   GLUCOSE 137* 136*  BUN 17 21  CREATININE 1.02* 1.00  CALCIUM 8.9  --    Liver Function Tests: Recent Labs  Lab 07/30/18 2223  AST 21  ALT 11  ALKPHOS 106  BILITOT 0.6  PROT 6.9  ALBUMIN 3.6   No results for input(s): LIPASE, AMYLASE in the last 168 hours. No results for input(s): AMMONIA in the last 168 hours. CBC: Recent Labs  Lab 07/30/18 2223 07/30/18 2229  WBC 5.9  --   NEUTROABS 3.7  --   HGB 12.9 12.9  HCT 42.1 38.0  MCV 95.5  --   PLT 118*  --    Cardiac Enzymes: No results for input(s): CKTOTAL, CKMB, CKMBINDEX, TROPONINI in the last 168 hours. BNP (last 3 results) Recent Labs    07/31/18 0105  BNP 105.5*    ProBNP (last 3 results) No results for input(s): PROBNP in the last 8760 hours.  CBG: Recent Labs  Lab 07/30/18 2223  GLUCAP 134*    No results found for this or any previous visit (from the past 240 hour(s)).   Studies: Mr Brain Wo Contrast  Result Date: 07/31/2018 CLINICAL DATA:  Left eye vision loss EXAM: MRI HEAD WITHOUT CONTRAST MRA HEAD WITHOUT CONTRAST TECHNIQUE: Multiplanar, multiecho pulse sequences of the brain and surrounding structures were obtained without intravenous contrast. Angiographic images of the head were obtained using MRA technique without contrast. COMPARISON:  Head CT 07/30/2018 FINDINGS: MRI HEAD FINDINGS BRAIN: There is no acute infarct, acute hemorrhage or mass effect. The midline structures are normal. There are no old infarcts. Multifocal white matter hyperintensity, most commonly due to chronic ischemic microangiopathy. Advanced atrophy for age. Foci of chronic microhemorrhage in the right frontal and parietal lobes. SKULL AND UPPER CERVICAL SPINE: The visualized skull base, calvarium, upper cervical spine and extracranial soft tissues are normal. SINUSES/ORBITS: No fluid levels or advanced mucosal  thickening. No mastoid or middle ear effusion. The orbits are normal. MRA HEAD FINDINGS Intracranial internal carotid arteries: Unchanged severe narrowing of the distal cavernous segment of the left ICA. Anterior cerebral arteries: Normal. Middle cerebral arteries: Unchanged moderate right M1 narrowing. Normal left MCA. Posterior communicating arteries: Present bilaterally. Posterior cerebral arteries: No proximal occlusion. Basilar artery: Normal. Vertebral arteries: Left dominant. Normal. Superior cerebellar arteries: Normal. Inferior cerebellar arteries: Right PICA not clearly seen. IMPRESSION: 1. No acute intracranial abnormality. 2. Severe atrophy with findings of chronic ischemic microangiopathy. 3. Unchanged severe stenosis of the distal cavernous segment of the left internal carotid artery. 4. Unchanged moderate right M1 MCA segment narrowing. Electronically Signed   By: Deatra Robinson M.D.   On: 07/31/2018 05:24   Mr Maxine Glenn Head Wo Contrast  Result Date: 07/31/2018 CLINICAL DATA:  Left eye vision loss EXAM: MRI HEAD WITHOUT CONTRAST MRA HEAD WITHOUT CONTRAST TECHNIQUE: Multiplanar, multiecho pulse sequences of the brain and surrounding structures were obtained without intravenous contrast. Angiographic images of the head were obtained using MRA technique without contrast. COMPARISON:  Head CT 07/30/2018 FINDINGS: MRI HEAD FINDINGS BRAIN: There is no acute infarct, acute hemorrhage or mass effect. The midline structures are normal. There are no old infarcts. Multifocal white matter hyperintensity, most commonly due to chronic ischemic  microangiopathy. Advanced atrophy for age. Foci of chronic microhemorrhage in the right frontal and parietal lobes. SKULL AND UPPER CERVICAL SPINE: The visualized skull base, calvarium, upper cervical spine and extracranial soft tissues are normal. SINUSES/ORBITS: No fluid levels or advanced mucosal thickening. No mastoid or middle ear effusion. The orbits are normal. MRA HEAD  FINDINGS Intracranial internal carotid arteries: Unchanged severe narrowing of the distal cavernous segment of the left ICA. Anterior cerebral arteries: Normal. Middle cerebral arteries: Unchanged moderate right M1 narrowing. Normal left MCA. Posterior communicating arteries: Present bilaterally. Posterior cerebral arteries: No proximal occlusion. Basilar artery: Normal. Vertebral arteries: Left dominant. Normal. Superior cerebellar arteries: Normal. Inferior cerebellar arteries: Right PICA not clearly seen. IMPRESSION: 1. No acute intracranial abnormality. 2. Severe atrophy with findings of chronic ischemic microangiopathy. 3. Unchanged severe stenosis of the distal cavernous segment of the left internal carotid artery. 4. Unchanged moderate right M1 MCA segment narrowing. Electronically Signed   By: Deatra Robinson M.D.   On: 07/31/2018 05:24   Ct Head Code Stroke Wo Contrast  Result Date: 07/30/2018 CLINICAL DATA:  Code stroke. Left eye vision loss and facial numbness. EXAM: CT HEAD WITHOUT CONTRAST TECHNIQUE: Contiguous axial images were obtained from the base of the skull through the vertex without intravenous contrast. COMPARISON:  12/28/2017 head CT FINDINGS: Brain: There is no mass, hemorrhage or extra-axial collection. There is generalized brain atrophy greater than expected at this age. There is hypoattenuation of the periventricular white matter, most commonly indicating chronic ischemic microangiopathy. Vascular: Atherosclerotic calcification of the internal carotid arteries at the skull base. No abnormal hyperdensity of the major intracranial arteries or dural venous sinuses. Skull: The visualized skull base, calvarium and extracranial soft tissues are normal. Sinuses/Orbits: No fluid levels or advanced mucosal thickening of the visualized paranasal sinuses. No mastoid or middle ear effusion. The orbits are normal. ASPECTS [facility] Stroke Program Early CT Score) - Ganglionic level infarction (caudate,  lentiform nuclei, internal capsule, insula, M1-M3 cortex): 7 - Supraganglionic infarction (M4-M6 cortex): 3 Total score (0-10 with 10 being normal): 10 IMPRESSION: 1. No acute hemorrhage. 2. Advanced atrophy and chronic microvascular ischemia. 3. ASPECTS is 10. These results were communicated to Dr. Caryl Pina at 10:42 pm on 07/30/2018 by text page via the [facility] messaging system. Electronically Signed   By: Deatra Robinson M.D.   On: 07/30/2018 22:43    Scheduled Meds: .  stroke: mapping our early stages of recovery book   Does not apply Once  . aspirin EC  81 mg Oral Daily  . atorvastatin  40 mg Oral Daily  . Calcium-Vitamin D  1 tablet Oral Daily  . cholecalciferol  1,000 Units Oral Daily  . clopidogrel  75 mg Oral Daily  . clotrimazole-betamethasone  1 application Topical BID  . fluconazole  150 mg Oral Q Tue  . levothyroxine  50 mcg Oral QAC breakfast  . loratadine  10 mg Oral Daily  . mometasone-formoterol  2 puff Inhalation BID  . nystatin   Topical TID  . sertraline  100 mg Oral Daily  . traZODone  100 mg Oral QHS   Continuous Infusions: . sodium chloride    . sodium chloride 10 mL/hr at 07/31/18 1121    Principal Problem:   Stroke-like symptoms Active Problems:   Vision loss of left eye   Acute metabolic encephalopathy   Hyperlipidemia   Hypothyroidism (acquired)   HTN (hypertension)   Chronic diastolic heart failure (HCC)   MDD (major depressive disorder), recurrent episode, moderate (HCC)  Thrombocytopenia (HCC)    Time spent: 40 minutes    Gwenyth Bender NP [facility]  If 7PM-7AM, please contact night-coverage at [email or web address], password [facility] 07/31/2018, 1:56 PM  LOS: 0 days

## 2018-07-31 NOTE — ED Notes (Signed)
Ordered breakfast tray  

## 2018-08-01 ENCOUNTER — Observation Stay (HOSPITAL_COMMUNITY): Payer: Medicare HMO

## 2018-08-01 DIAGNOSIS — R2990 Unspecified symptoms and signs involving the nervous system: Secondary | ICD-10-CM

## 2018-08-01 DIAGNOSIS — D696 Thrombocytopenia, unspecified: Secondary | ICD-10-CM

## 2018-08-01 DIAGNOSIS — E785 Hyperlipidemia, unspecified: Secondary | ICD-10-CM | POA: Diagnosis not present

## 2018-08-01 DIAGNOSIS — I6523 Occlusion and stenosis of bilateral carotid arteries: Secondary | ICD-10-CM | POA: Diagnosis not present

## 2018-08-01 DIAGNOSIS — I639 Cerebral infarction, unspecified: Secondary | ICD-10-CM | POA: Diagnosis not present

## 2018-08-01 DIAGNOSIS — R930 Abnormal findings on diagnostic imaging of skull and head, not elsewhere classified: Secondary | ICD-10-CM | POA: Diagnosis not present

## 2018-08-01 HISTORY — PX: IR ANGIO INTRA EXTRACRAN SEL COM CAROTID INNOMINATE BILAT MOD SED: IMG5360

## 2018-08-01 HISTORY — PX: IR ANGIO VERTEBRAL SEL SUBCLAVIAN INNOMINATE UNI R MOD SED: IMG5365

## 2018-08-01 LAB — PATHOLOGIST SMEAR REVIEW

## 2018-08-01 MED ORDER — MIDAZOLAM HCL 2 MG/2ML IJ SOLN
INTRAMUSCULAR | Status: AC
  Filled 2018-08-01: qty 2

## 2018-08-01 MED ORDER — FENTANYL CITRATE (PF) 100 MCG/2ML IJ SOLN
INTRAMUSCULAR | Status: AC | PRN
  Administered 2018-08-01: 25 ug via INTRAVENOUS

## 2018-08-01 MED ORDER — IOHEXOL 300 MG/ML  SOLN: 84.0000 mL | Freq: Once | INTRAMUSCULAR | Status: DC | PRN

## 2018-08-01 MED ORDER — SODIUM CHLORIDE 0.9 % IV SOLN
INTRAVENOUS | Status: AC | PRN
  Administered 2018-08-01: 250 mL via INTRAVENOUS

## 2018-08-01 MED ORDER — LIDOCAINE HCL 1 % IJ SOLN
INTRAMUSCULAR | Status: AC
  Filled 2018-08-01: qty 20

## 2018-08-01 MED ORDER — TRAMADOL HCL 50 MG PO TABS: 50.0000 mg | ORAL_TABLET | Freq: Four times a day (QID) | ORAL | Status: DC | PRN

## 2018-08-01 MED ORDER — SODIUM CHLORIDE 0.9 % IV SOLN
INTRAVENOUS | Status: AC
  Administered 2018-08-01: 17:00:00 via INTRAVENOUS

## 2018-08-01 MED ORDER — HEPARIN SODIUM (PORCINE) 1000 UNIT/ML IJ SOLN
INTRAMUSCULAR | Status: AC
  Filled 2018-08-01: qty 1

## 2018-08-01 MED ORDER — MIDAZOLAM HCL 2 MG/2ML IJ SOLN
INTRAMUSCULAR | Status: AC | PRN
  Administered 2018-08-01: 1 mg via INTRAVENOUS

## 2018-08-01 MED ORDER — HEPARIN SODIUM (PORCINE) 1000 UNIT/ML IJ SOLN
INTRAMUSCULAR | Status: AC | PRN
  Administered 2018-08-01: 1000 [IU] via INTRAVENOUS

## 2018-08-01 MED ORDER — LIDOCAINE HCL 1 % IJ SOLN
INTRAMUSCULAR | Status: AC | PRN
  Administered 2018-08-01: 10 mL

## 2018-08-01 MED ORDER — IOPAMIDOL (ISOVUE-300) INJECTION 61%
INTRAVENOUS | Status: AC
  Filled 2018-08-01: qty 50

## 2018-08-01 MED ORDER — FENTANYL CITRATE (PF) 100 MCG/2ML IJ SOLN
INTRAMUSCULAR | Status: AC
  Filled 2018-08-01: qty 2

## 2018-08-01 NOTE — Evaluation (Signed)
Occupational Therapy Evaluation Patient Details Name: [name] [name] MRN: [id number] DOB: 10-11-1945 Today's Date: 08/01/2018    History of Present Illness Pt is a 72 y/o female admitted secondary to L eye vision loss, and L facial numbness. Pt also with broken blood vessel in eye. Pt reports symptoms have since resolved. MRI negative for acute abnormality. PMH includes HTN, COPD, PVD, CVA with residual R weakness, and s/p kyphoplasty.    Clinical Impression   This 72 y/o female presents with the above. At baseline pt reports she ambulates without AD, daughter assists with ADL completion. Pt completing room level functional mobility this session without AD, requiring minA throughout as pt mildly unsteady with mobility. Pt currently requires minA for standing grooming and toileting ADL, minguard for seated UB ADL and modA for LB ADLs secondary to difficulty reaching LEs. Pt continues to have impaired vision in L eye, but does not appear to significantly impact her ability to perform functional tasks. Pt's daughter present and reports she is present at home majority of the time to assist with ADLs PRN. Pt will benefit from continued OT services in acute setting and recommend follow up HHOT services to maximize pt's overall safety and independence with ADLs and mobility after discharge home.     Follow Up Recommendations  Home health OT;Supervision/Assistance - 24 hour    Equipment Recommendations  None recommended by OT           Precautions / Restrictions Precautions Precautions: Fall Restrictions Weight Bearing Restrictions: No      Mobility Bed Mobility Overal bed mobility: Needs Assistance Bed Mobility: Supine to Sit;Sit to Supine     Supine to sit: Min assist Sit to supine: Min guard   General bed mobility comments: light minA to support trunk upright into sitting; increased effort to bring LEs into bed when returning to supine requiring close minguard for  safety  Transfers Overall transfer level: Needs assistance Equipment used: None Transfers: Sit to/from Stand Sit to Stand: Min guard;Min assist         General transfer comment: pt able to stand from EOB and BSC in front of sink with overall close minguard for safety and immediate standing balance; minA from lower toilet height in bathroom     Balance Overall balance assessment: Needs assistance Sitting-balance support: No upper extremity supported;Feet supported Sitting balance-Leahy Scale: Good     Standing balance support: No upper extremity supported;During functional activity Standing balance-Leahy Scale: Fair Standing balance comment: pt slightly unsteady with mobility without UE support requiring minA; able to static stand with minguard                           ADL either performed or assessed with clinical judgement   ADL Overall ADL's : Needs assistance/impaired Eating/Feeding: NPO   Grooming: Wash/dry face;Set up;Sitting   Upper Body Bathing: Min guard;Sitting;Minimal assistance;Standing Upper Body Bathing Details (indicate cue type and reason): minA standing balance as pt with one slight LOB to the R, minA to correct Lower Body Bathing: Minimal assistance;Sit to/from stand   Upper Body Dressing : Min guard;Minimal assistance;Sitting Upper Body Dressing Details (indicate cue type and reason): doffing/donning new gown Lower Body Dressing: Moderate assistance;Sit to/from stand Lower Body Dressing Details (indicate cue type and reason): pt requires assist to don socks, reports this is baseline which dtr assists with (daughter present and confirms) Toilet Transfer: Minimal assistance;Ambulation;Regular Toilet   Toileting- Clothing Manipulation and Hygiene: Minimal assistance;Sit  to/from stand Toileting - Architect Details (indicate cue type and reason): min steadying assist in standing while pt performs peri-care after voiding bladder      Functional mobility during ADLs: Minimal assistance General ADL Comments: pt with soiled bed linens start of session as pure wick not working, transitioned to sitting at sink to wash up while bed linens changed. pt with generalized weakness and requiring minA throughout mobility this session      Vision Baseline Vision/History: Wears glasses Wears Glasses: Reading only("supposed" to wear them, typically does not) Patient Visual Report: Blurring of vision(L eye) Vision Assessment?: Yes Eye Alignment: Within Functional Limits Ocular Range of Motion: Impaired-to be further tested in functional context Alignment/Gaze Preference: Within Defined Limits Tracking/Visual Pursuits: Decreased smoothness of horizontal tracking;Decreased smoothness of vertical tracking;Unable to hold eye position out of midline;Impaired - to be further tested in functional context Visual Fields: No apparent deficits(will continue to assess) Additional Comments: pt able to read medium-large font on therapist's name badge with R eye covered, could not read smallest font; with both eyes uncovered pt able to read smaller font, though effortful      Perception     Praxis      Pertinent Vitals/Pain Pain Assessment: Faces Faces Pain Scale: Hurts little more Pain Location: head Pain Descriptors / Indicators: Headache Pain Intervention(s): Monitored during session;RN gave pain meds during session     Hand Dominance Right   Extremity/Trunk Assessment Upper Extremity Assessment Upper Extremity Assessment: Generalized weakness;RUE deficits/detail;LUE deficits/detail RUE Deficits / Details: decreased fine motor at baseline from previous CVA, grossly 3-/5 RUE Coordination: decreased fine motor LUE Deficits / Details: decreased fine motor (L slightly worse than R), grossly 3/5 strength  LUE Coordination: decreased fine motor   Lower Extremity Assessment Lower Extremity Assessment: Defer to PT evaluation   Cervical /  Trunk Assessment Cervical / Trunk Assessment: Kyphotic   Communication Communication Communication: No difficulties   Cognition Arousal/Alertness: Awake/alert Behavior During Therapy: WFL for tasks assessed/performed Overall Cognitive Status: Within Functional Limits for tasks assessed                                     General Comments  Pt's daughter present during session     Exercises     Shoulder Instructions      Home Living Family/patient expects to be discharged to:: Private residence Living Arrangements: Children Available Help at Discharge: Family;Available 24 hours/day Type of Home: House Home Access: Level entry     Home Layout: Two level Alternate Level Stairs-Number of Steps: flight Alternate Level Stairs-Rails: Right;Left Bathroom Shower/Tub: Chief Strategy Officer: Standard     Home Equipment: Environmental consultant - 2 wheels;Bedside commode;Shower seat          Prior Functioning/Environment Level of Independence: Needs assistance  Gait / Transfers Assistance Needed: Reports she did not use AD for ambulation, however, daughter reports she has been instructed to use RW.  ADL's / Homemaking Assistance Needed: Daughter assists with bathing.             OT Problem List:        OT Treatment/Interventions: Self-care/ADL training;Therapeutic exercise;Therapeutic activities;Visual/perceptual remediation/compensation;Patient/family education;Balance training    OT Goals(Current goals can be found in the care plan section) Acute Rehab OT Goals Patient Stated Goal: to go home  OT Goal Formulation: With patient Time For Goal Achievement: 08/15/18 Potential to Achieve Goals: Good  OT  Frequency: Min 2X/week   Barriers to D/C:            Co-evaluation              AM-PAC PT "6 Clicks" Daily Activity     Outcome Measure Help from another person eating meals?: None Help from another person taking care of personal grooming?:  None Help from another person toileting, which includes using toliet, bedpan, or urinal?: A Little Help from another person bathing (including washing, rinsing, drying)?: A Little Help from another person to put on and taking off regular upper body clothing?: A Little Help from another person to put on and taking off regular lower body clothing?: A Lot 6 Click Score: 19   End of Session Equipment Utilized During Treatment: Gait belt Nurse Communication: Mobility status  Activity Tolerance: Patient tolerated treatment well Patient left: in bed;with call bell/phone within reach;with family/visitor present  OT Visit Diagnosis: Unsteadiness on feet (R26.81);Muscle weakness (generalized) (M62.81)                Time: 9604-5409 OT Time Calculation (min): 35 min Charges:  OT General Charges $OT Visit: 1 Visit OT Evaluation $OT Eval Low Complexity: 1 Low OT Treatments $Self Care/Home Management : 8-22 mins  Marcy Siren, OT [facility] Services Pager [phone] Office [phone]    Orlando Penner 08/01/2018, 9:46 AM

## 2018-08-01 NOTE — Procedures (Signed)
S/P bilateral common carotid arteriograms and Rt VA angiogram . RT CFA approach. Findings. 1.Approx 60 % stenosis of Lt ICA caval cav segment. 2.Approx 50 % stenosis RT CCA distally. 3.Approx 50 % stenosis RT MCA M1 seg 4.Approx 50 %  To 55 % stenosis Lt CCA mid seg

## 2018-08-01 NOTE — Progress Notes (Signed)
Progress Note    [name] [name]  VHQ:[id number] DOB: 1945/11/08  DOA: 07/30/2018 PCP: [name], MD      Assessment/Plan:   Principal Problem:   Stroke-like symptoms Active Problems:   Hyperlipidemia   MDD (major depressive disorder), recurrent episode, moderate (HCC)   Hypothyroidism (acquired)   HTN (hypertension)   Chronic diastolic heart failure (HCC)   Vision loss of left eye   Acute metabolic encephalopathy   Thrombocytopenia (HCC)  Strokelike symptoms/facial  Droop/loss of vision left eye(HCC):Patient has transient vision loss, persistent left facial droop and numbness, concerning for new stroke versus TIA.CT head is negative for acute intracranial abnormalities. MRI with no acute intracranial abnormality, severe atrophy, unchanged severe stenosis of distal cavernous segment of the left internal carotid artery. IR consult requested per neurology and diagnositic agniogram planned. No events on tele. Echo with EF 60% and grade 1 DD, HgA1c 5.1 lipid panel with LDL 103, LDH 141. - Continue Plavix  -s/p cerebral angiogram -MRI pending repeat  Left eye vision loss:  Patient reports vision back to baseline/slightly blurry.  -appointment for 11/8 at 10: 30 with Dr Jorje Guild who is dr Deirdre Evener partner as dr Elmer Picker not available that soon.   Carotid artery stenosis:History ofhigh-grade stenosis of the left internal carotid artery cavernous segment. She recently saw Dr. Corliss Skains -followed closely by dr Marcheta Grammes -cerebral angio done today: S/P bilateral common carotid arteriograms and Rt VA angiogram . RT CFA approach. Findings. 1.Approx 60 % stenosis of Lt ICA caval cav segment. 2.Approx 50 % stenosis RT CCA distally. 3.Approx 50 % stenosis RT MCA M1 seg 4.Approx 50 %  To 55 % stenosis Lt CCA mid seg -plan is to re-peat MRI and then stent if lesion  HLD: -lipitor  MDD (major depressive disorder), recurrent episode, moderate (HCC): -Continue  Zoloft  Essential hypertension: -goal BP 130-150 due to stenosis  Hypothyroidism: Last TSH was2.64 on 06/27/2018 -Continue home Synthroid  Chronic diastolic heart failure (HCC):2D echo on 11/14/2017 showed EF 55-60% with grade 1 diastolic dysfunction. Patient does not have leg edema JVD. No respiratory distress. CHF is compensated. -Hold Lasix for now  Acute metabolic encephalopathy:  -resolved  Thrombocytopenia: Platelets 118.  -trend with daily labs  Intertrigo: -On Diflucan, topical nystatin powder and cream of clotrimazole- betamethasone    Medical Consultants:    IR/neuro.     Subjective:   Vision slightly blurry, no pressure  Objective:    Vitals:   08/01/18 1354 08/01/18 1416 08/01/18 1425 08/01/18 1510  BP: 139/61 (!) 131/58 (!) 131/58 136/67  Pulse: 62 65 65 66  Resp: 10 12 12 18   Temp:    98.1 F (36.7 C)  TempSrc:    Oral  SpO2: 98% 98% 98% 93%    Intake/Output Summary (Last 24 hours) at 08/01/2018 1557 Last data filed at 07/31/2018 1630 Gross per 24 hour  Intake 128.19 ml  Output -  Net 128.19 ml   There were no vitals filed for this visit.  Exam: In bed, chronically ill appearing rrr No increased work of breathing Able to answer questions +BS, soft  Data Reviewed:   I have personally reviewed following labs and imaging studies:  Labs: Labs show the following:   Basic Metabolic Panel: Recent Labs  Lab 07/30/18 2223 07/30/18 2229  NA 138 141  K 3.9 3.9  CL 107 107  CO2 24  --   GLUCOSE 137* 136*  BUN 17 21  CREATININE 1.02* 1.00  CALCIUM 8.9  --  GFR Estimated Creatinine Clearance: 47 mL/min (by C-G formula based on SCr of 1 mg/dL). Liver Function Tests: Recent Labs  Lab 07/30/18 2223  AST 21  ALT 11  ALKPHOS 106  BILITOT 0.6  PROT 6.9  ALBUMIN 3.6   No results for input(s): LIPASE, AMYLASE in the last 168 hours. No results for input(s): AMMONIA in the last 168 hours. Coagulation profile Recent  Labs  Lab 07/30/18 2223  INR 1.02    CBC: Recent Labs  Lab 07/30/18 2223 07/30/18 2229  WBC 5.9  --   NEUTROABS 3.7  --   HGB 12.9 12.9  HCT 42.1 38.0  MCV 95.5  --   PLT 118*  --    Cardiac Enzymes: No results for input(s): CKTOTAL, CKMB, CKMBINDEX, TROPONINI in the last 168 hours. BNP (last 3 results) No results for input(s): PROBNP in the last 8760 hours. CBG: Recent Labs  Lab 07/30/18 2223  GLUCAP 134*   D-Dimer: No results for input(s): DDIMER in the last 72 hours. Hgb A1c: Recent Labs    07/31/18 0217  HGBA1C 5.1   Lipid Profile: Recent Labs    07/31/18 0217  CHOL 183  HDL 64  LDLCALC 103*  TRIG 79  CHOLHDL 2.9   Thyroid function studies: No results for input(s): TSH, T4TOTAL, T3FREE, THYROIDAB in the last 72 hours.  Invalid input(s): FREET3 Anemia work up: No results for input(s): VITAMINB12, FOLATE, FERRITIN, TIBC, IRON, RETICCTPCT in the last 72 hours. Sepsis Labs: Recent Labs  Lab 07/30/18 2223  WBC 5.9    Microbiology No results found for this or any previous visit (from the past 240 hour(s)).  Procedures and diagnostic studies:  Mr Brain Wo Contrast  Result Date: 07/31/2018 CLINICAL DATA:  Left eye vision loss EXAM: MRI HEAD WITHOUT CONTRAST MRA HEAD WITHOUT CONTRAST TECHNIQUE: Multiplanar, multiecho pulse sequences of the brain and surrounding structures were obtained without intravenous contrast. Angiographic images of the head were obtained using MRA technique without contrast. COMPARISON:  Head CT 07/30/2018 FINDINGS: MRI HEAD FINDINGS BRAIN: There is no acute infarct, acute hemorrhage or mass effect. The midline structures are normal. There are no old infarcts. Multifocal white matter hyperintensity, most commonly due to chronic ischemic microangiopathy. Advanced atrophy for age. Foci of chronic microhemorrhage in the right frontal and parietal lobes. SKULL AND UPPER CERVICAL SPINE: The visualized skull base, calvarium, upper cervical  spine and extracranial soft tissues are normal. SINUSES/ORBITS: No fluid levels or advanced mucosal thickening. No mastoid or middle ear effusion. The orbits are normal. MRA HEAD FINDINGS Intracranial internal carotid arteries: Unchanged severe narrowing of the distal cavernous segment of the left ICA. Anterior cerebral arteries: Normal. Middle cerebral arteries: Unchanged moderate right M1 narrowing. Normal left MCA. Posterior communicating arteries: Present bilaterally. Posterior cerebral arteries: No proximal occlusion. Basilar artery: Normal. Vertebral arteries: Left dominant. Normal. Superior cerebellar arteries: Normal. Inferior cerebellar arteries: Right PICA not clearly seen. IMPRESSION: 1. No acute intracranial abnormality. 2. Severe atrophy with findings of chronic ischemic microangiopathy. 3. Unchanged severe stenosis of the distal cavernous segment of the left internal carotid artery. 4. Unchanged moderate right M1 MCA segment narrowing. Electronically Signed   By: Deatra Robinson M.D.   On: 07/31/2018 05:24   Mr Maxine Glenn Head Wo Contrast  Result Date: 07/31/2018 CLINICAL DATA:  Left eye vision loss EXAM: MRI HEAD WITHOUT CONTRAST MRA HEAD WITHOUT CONTRAST TECHNIQUE: Multiplanar, multiecho pulse sequences of the brain and surrounding structures were obtained without intravenous contrast. Angiographic images of the head were  obtained using MRA technique without contrast. COMPARISON:  Head CT 07/30/2018 FINDINGS: MRI HEAD FINDINGS BRAIN: There is no acute infarct, acute hemorrhage or mass effect. The midline structures are normal. There are no old infarcts. Multifocal white matter hyperintensity, most commonly due to chronic ischemic microangiopathy. Advanced atrophy for age. Foci of chronic microhemorrhage in the right frontal and parietal lobes. SKULL AND UPPER CERVICAL SPINE: The visualized skull base, calvarium, upper cervical spine and extracranial soft tissues are normal. SINUSES/ORBITS: No fluid levels  or advanced mucosal thickening. No mastoid or middle ear effusion. The orbits are normal. MRA HEAD FINDINGS Intracranial internal carotid arteries: Unchanged severe narrowing of the distal cavernous segment of the left ICA. Anterior cerebral arteries: Normal. Middle cerebral arteries: Unchanged moderate right M1 narrowing. Normal left MCA. Posterior communicating arteries: Present bilaterally. Posterior cerebral arteries: No proximal occlusion. Basilar artery: Normal. Vertebral arteries: Left dominant. Normal. Superior cerebellar arteries: Normal. Inferior cerebellar arteries: Right PICA not clearly seen. IMPRESSION: 1. No acute intracranial abnormality. 2. Severe atrophy with findings of chronic ischemic microangiopathy. 3. Unchanged severe stenosis of the distal cavernous segment of the left internal carotid artery. 4. Unchanged moderate right M1 MCA segment narrowing. Electronically Signed   By: Deatra Robinson M.D.   On: 07/31/2018 05:24   Ct Head Code Stroke Wo Contrast  Result Date: 07/30/2018 CLINICAL DATA:  Code stroke. Left eye vision loss and facial numbness. EXAM: CT HEAD WITHOUT CONTRAST TECHNIQUE: Contiguous axial images were obtained from the base of the skull through the vertex without intravenous contrast. COMPARISON:  12/28/2017 head CT FINDINGS: Brain: There is no mass, hemorrhage or extra-axial collection. There is generalized brain atrophy greater than expected at this age. There is hypoattenuation of the periventricular white matter, most commonly indicating chronic ischemic microangiopathy. Vascular: Atherosclerotic calcification of the internal carotid arteries at the skull base. No abnormal hyperdensity of the major intracranial arteries or dural venous sinuses. Skull: The visualized skull base, calvarium and extracranial soft tissues are normal. Sinuses/Orbits: No fluid levels or advanced mucosal thickening of the visualized paranasal sinuses. No mastoid or middle ear effusion. The orbits  are normal. ASPECTS [facility] Stroke Program Early CT Score) - Ganglionic level infarction (caudate, lentiform nuclei, internal capsule, insula, M1-M3 cortex): 7 - Supraganglionic infarction (M4-M6 cortex): 3 Total score (0-10 with 10 being normal): 10 IMPRESSION: 1. No acute hemorrhage. 2. Advanced atrophy and chronic microvascular ischemia. 3. ASPECTS is 10. These results were communicated to Dr. Caryl Pina at 10:42 pm on 07/30/2018 by text page via the [facility] messaging system. Electronically Signed   By: Deatra Robinson M.D.   On: 07/30/2018 22:43    Medications:   .  stroke: mapping our early stages of recovery book   Does not apply Once  . aspirin EC  325 mg Oral Daily  . atorvastatin  40 mg Oral Daily  . calcium-vitamin D  1 tablet Oral QPC breakfast  . cholecalciferol  1,000 Units Oral Daily  . clopidogrel  75 mg Oral Daily  . clotrimazole-betamethasone  1 application Topical BID  . fentaNYL      . fluconazole  150 mg Oral Q Tue  . heparin      . levothyroxine  50 mcg Oral QAC breakfast  . lidocaine      . loratadine  10 mg Oral Daily  . midazolam      . mometasone-formoterol  2 puff Inhalation BID  . nystatin   Topical TID  . sertraline  100 mg Oral Daily  .  traZODone  100 mg Oral QHS   Continuous Infusions: . sodium chloride 10 mL/hr at 07/31/18 1121  . sodium chloride       LOS: 0 days   Joseph Art  [facility]   *Please refer to [email or web address], password [email or web address] to get updated schedule on who will round on this patient, as hospitalists switch teams weekly. If 7PM-7AM, please contact night-coverage at [email or web address], password [email or web address] for any overnight needs.  08/01/2018, 3:57 PM

## 2018-08-01 NOTE — Sedation Documentation (Signed)
ETC02 removed per Dr. Deveshwar  

## 2018-08-01 NOTE — Progress Notes (Signed)
STROKE TEAM PROGRESS NOTE   SUBJECTIVE (INTERVAL HISTORY) Her daughter is at the bedside. Pt lying in bed, no acute distress.  Left eye blurry vision slightly better, but largely unchanged.  She complained of last night intermittent right facial numbness, which has been resolved today.  Had DSA showed left ICA cavernous segment 60% stenosis, 50% stenosis right CCA and right M1 as well as left CCA mid segment.  Will repeat limited MRI to rule out stroke given the episode of right side numbness overnight.   OBJECTIVE Temp:  [97.4 F (36.3 C)-98.2 F (36.8 C)] 98.1 F (36.7 C) (11/06 1510) Pulse Rate:  [62-85] 68 (11/06 1645) Cardiac Rhythm: Normal sinus rhythm (11/06 1426) Resp:  [10-20] 20 (11/06 1600) BP: (74-152)/(37-69) 140/48 (11/06 1645) SpO2:  [93 %-100 %] 93 % (11/06 1510)  Recent Labs  Lab 07/30/18 2223  GLUCAP 134*   Recent Labs  Lab 07/30/18 2223 07/30/18 2229  NA 138 141  K 3.9 3.9  CL 107 107  CO2 24  --   GLUCOSE 137* 136*  BUN 17 21  CREATININE 1.02* 1.00  CALCIUM 8.9  --    Recent Labs  Lab 07/30/18 2223  AST 21  ALT 11  ALKPHOS 106  BILITOT 0.6  PROT 6.9  ALBUMIN 3.6   Recent Labs  Lab 07/30/18 2223 07/30/18 2229  WBC 5.9  --   NEUTROABS 3.7  --   HGB 12.9 12.9  HCT 42.1 38.0  MCV 95.5  --   PLT 118*  --    No results for input(s): CKTOTAL, CKMB, CKMBINDEX, TROPONINI in the last 168 hours. Recent Labs    07/30/18 2223  LABPROT 13.3  INR 1.02   Recent Labs    07/31/18 0817  COLORURINE YELLOW  LABSPEC 1.010  PHURINE 5.0  GLUCOSEU NEGATIVE  HGBUR NEGATIVE  BILIRUBINUR NEGATIVE  KETONESUR NEGATIVE  PROTEINUR NEGATIVE  NITRITE NEGATIVE  LEUKOCYTESUR NEGATIVE       Component Value Date/Time   CHOL 183 07/31/2018 0217   TRIG 79 07/31/2018 0217   HDL 64 07/31/2018 0217   CHOLHDL 2.9 07/31/2018 0217   VLDL 16 07/31/2018 0217   LDLCALC 103 (H) 07/31/2018 0217   Lab Results  Component Value Date   HGBA1C 5.1 07/31/2018       Component Value Date/Time   LABOPIA NONE DETECTED 07/31/2018 0817   COCAINSCRNUR NONE DETECTED 07/31/2018 0817   LABBENZ NONE DETECTED 07/31/2018 0817   AMPHETMU NONE DETECTED 07/31/2018 0817   THCU NONE DETECTED 07/31/2018 0817   LABBARB NONE DETECTED 07/31/2018 0817    No results for input(s): ETH in the last 168 hours.  I have personally reviewed the radiological images below and agree with the radiology interpretations.  Ct Angio Head W Or Wo Contrast  Result Date: 07/18/2018 CLINICAL DATA:  Followup intracranial and extracranial arterial stenoses. EXAM: CT ANGIOGRAPHY HEAD AND NECK TECHNIQUE: Multidetector CT imaging of the head and neck was performed using the standard protocol during bolus administration of intravenous contrast. Multiplanar CT image reconstructions and MIPs were obtained to evaluate the vascular anatomy. Carotid stenosis measurements (when applicable) are obtained utilizing NASCET criteria, using the distal internal carotid diameter as the denominator. CONTRAST:  70m ISOVUE-370 IOPAMIDOL (ISOVUE-370) INJECTION 76% COMPARISON:  12/28/2017 FINDINGS: CT HEAD FINDINGS Brain: There is no evidence of acute infarct, intracranial hemorrhage, mass, midline shift, or extra-axial fluid collection. Advanced atrophy is again noted in the parietal greater than frontal lobes bilaterally, unchanged. Patchy cerebral white matter hypodensities  are unchanged and nonspecific but compatible with mild chronic small vessel ischemic disease. Vascular: Calcified atherosclerosis at the skull base. Skull: No fracture or focal osseous lesion. Sinuses: Clear paranasal sinuses and mastoid air cells. Orbits: Bilateral cataract extraction. Review of the MIP images confirms the above findings CTA NECK FINDINGS Aortic arch: Standard 3 vessel aortic arch with moderate calcified and soft plaque. Calcified and soft plaque at the right subclavian artery origin with unchanged severe stenosis. Patent left  subclavian artery with diffuse atherosclerosis but no significant proximal stenosis. Right carotid system: Patent common carotid artery with limited assessment of its origin due to motion artifact though with suspected at least mild stenosis. Additional calcified and soft plaque in the mid and distal common carotid artery resulting in up to mild stenosis. Extensive calcified plaque at the carotid bifurcation results in 50% ICA origin stenosis, unchanged. Left carotid system: Patent common carotid artery with primarily soft plaque resulting in up to 55% stenosis, not significantly changed though better demonstrated on today's study. 50% stenosis of the mid common carotid artery associated with a short dissection flap/web or focus of plaque ulceration is unchanged. Predominantly calcified plaque in the carotid bulb results in less than 50% narrowing, unchanged. Vertebral arteries: The vertebral arteries are patent with the left being strongly dominant. Severe right and moderate left vertebral artery origin stenoses are unchanged. Skeleton: Mild-to-moderate lower cervical disc degeneration. Other neck: No evidence of acute abnormality or mass. Upper chest: Mild centrilobular emphysema. Small amount of material in the right aspect of the trachea near the thoracic inlet with a different configuration than on the prior study favored to represent secretions. Review of the MIP images confirms the above findings CTA HEAD FINDINGS Anterior circulation: The internal carotid arteries are patent from skull base to carotid termini with similar appearance of extensive atherosclerosis bilaterally including severe distal left cavernous and moderate right anterior genu stenoses. Focally ectasia of the right cavernous ICA is unchanged. ACAs and MCAs are patent with stable to mildly progressive severe tandem right M1 stenoses. Posterior circulation: The intracranial vertebral arteries are widely patent to the basilar. Patent left PICA,  bilateral AICA, and bilateral SCA origins are visualized. The basilar artery is widely patent. Posterior communicating arteries are not identified. The PCAs are patent with atherosclerotic irregularity bilaterally and mild right and severe left P2 stenoses, stable to slightly progressed. No aneurysm is identified. Venous sinuses: Patent. Anatomic variants: None. Delayed phase: No abnormal enhancement. Review of the MIP images confirms the above findings IMPRESSION: 1. Advanced intracranial atherosclerosis including unchanged severe left cavernous ICA stenosis and stable to slight progression of severe right M1 and left P2 stenoses. 2. Unchanged atherosclerosis in the neck including 50% right ICA origin stenosis, 50-55% left common carotid artery stenoses, severe right subclavian artery origin stenosis, and severe right and moderate left vertebral artery origin stenoses. 3. No evidence of acute intracranial abnormality. 4. Unchanged frontoparietal atrophy. 5. Aortic Atherosclerosis (ICD10-I70.0) and Emphysema (ICD10-J43.9). Electronically Signed   By: Logan Bores M.D.   On: 07/18/2018 18:14   Ct Angio Neck W Or Wo Contrast  Result Date: 07/18/2018 CLINICAL DATA:  Followup intracranial and extracranial arterial stenoses. EXAM: CT ANGIOGRAPHY HEAD AND NECK TECHNIQUE: Multidetector CT imaging of the head and neck was performed using the standard protocol during bolus administration of intravenous contrast. Multiplanar CT image reconstructions and MIPs were obtained to evaluate the vascular anatomy. Carotid stenosis measurements (when applicable) are obtained utilizing NASCET criteria, using the distal internal carotid diameter as the  denominator. CONTRAST:  42m ISOVUE-370 IOPAMIDOL (ISOVUE-370) INJECTION 76% COMPARISON:  12/28/2017 FINDINGS: CT HEAD FINDINGS Brain: There is no evidence of acute infarct, intracranial hemorrhage, mass, midline shift, or extra-axial fluid collection. Advanced atrophy is again noted  in the parietal greater than frontal lobes bilaterally, unchanged. Patchy cerebral white matter hypodensities are unchanged and nonspecific but compatible with mild chronic small vessel ischemic disease. Vascular: Calcified atherosclerosis at the skull base. Skull: No fracture or focal osseous lesion. Sinuses: Clear paranasal sinuses and mastoid air cells. Orbits: Bilateral cataract extraction. Review of the MIP images confirms the above findings CTA NECK FINDINGS Aortic arch: Standard 3 vessel aortic arch with moderate calcified and soft plaque. Calcified and soft plaque at the right subclavian artery origin with unchanged severe stenosis. Patent left subclavian artery with diffuse atherosclerosis but no significant proximal stenosis. Right carotid system: Patent common carotid artery with limited assessment of its origin due to motion artifact though with suspected at least mild stenosis. Additional calcified and soft plaque in the mid and distal common carotid artery resulting in up to mild stenosis. Extensive calcified plaque at the carotid bifurcation results in 50% ICA origin stenosis, unchanged. Left carotid system: Patent common carotid artery with primarily soft plaque resulting in up to 55% stenosis, not significantly changed though better demonstrated on today's study. 50% stenosis of the mid common carotid artery associated with a short dissection flap/web or focus of plaque ulceration is unchanged. Predominantly calcified plaque in the carotid bulb results in less than 50% narrowing, unchanged. Vertebral arteries: The vertebral arteries are patent with the left being strongly dominant. Severe right and moderate left vertebral artery origin stenoses are unchanged. Skeleton: Mild-to-moderate lower cervical disc degeneration. Other neck: No evidence of acute abnormality or mass. Upper chest: Mild centrilobular emphysema. Small amount of material in the right aspect of the trachea near the thoracic inlet  with a different configuration than on the prior study favored to represent secretions. Review of the MIP images confirms the above findings CTA HEAD FINDINGS Anterior circulation: The internal carotid arteries are patent from skull base to carotid termini with similar appearance of extensive atherosclerosis bilaterally including severe distal left cavernous and moderate right anterior genu stenoses. Focally ectasia of the right cavernous ICA is unchanged. ACAs and MCAs are patent with stable to mildly progressive severe tandem right M1 stenoses. Posterior circulation: The intracranial vertebral arteries are widely patent to the basilar. Patent left PICA, bilateral AICA, and bilateral SCA origins are visualized. The basilar artery is widely patent. Posterior communicating arteries are not identified. The PCAs are patent with atherosclerotic irregularity bilaterally and mild right and severe left P2 stenoses, stable to slightly progressed. No aneurysm is identified. Venous sinuses: Patent. Anatomic variants: None. Delayed phase: No abnormal enhancement. Review of the MIP images confirms the above findings IMPRESSION: 1. Advanced intracranial atherosclerosis including unchanged severe left cavernous ICA stenosis and stable to slight progression of severe right M1 and left P2 stenoses. 2. Unchanged atherosclerosis in the neck including 50% right ICA origin stenosis, 50-55% left common carotid artery stenoses, severe right subclavian artery origin stenosis, and severe right and moderate left vertebral artery origin stenoses. 3. No evidence of acute intracranial abnormality. 4. Unchanged frontoparietal atrophy. 5. Aortic Atherosclerosis (ICD10-I70.0) and Emphysema (ICD10-J43.9). Electronically Signed   By: ALogan BoresM.D.   On: 07/18/2018 18:14   Mr Brain Wo Contrast  Result Date: 07/31/2018 CLINICAL DATA:  Left eye vision loss EXAM: MRI HEAD WITHOUT CONTRAST MRA HEAD WITHOUT CONTRAST TECHNIQUE: Multiplanar,  multiecho pulse sequences of the brain and surrounding structures were obtained without intravenous contrast. Angiographic images of the head were obtained using MRA technique without contrast. COMPARISON:  Head CT 07/30/2018 FINDINGS: MRI HEAD FINDINGS BRAIN: There is no acute infarct, acute hemorrhage or mass effect. The midline structures are normal. There are no old infarcts. Multifocal white matter hyperintensity, most commonly due to chronic ischemic microangiopathy. Advanced atrophy for age. Foci of chronic microhemorrhage in the right frontal and parietal lobes. SKULL AND UPPER CERVICAL SPINE: The visualized skull base, calvarium, upper cervical spine and extracranial soft tissues are normal. SINUSES/ORBITS: No fluid levels or advanced mucosal thickening. No mastoid or middle ear effusion. The orbits are normal. MRA HEAD FINDINGS Intracranial internal carotid arteries: Unchanged severe narrowing of the distal cavernous segment of the left ICA. Anterior cerebral arteries: Normal. Middle cerebral arteries: Unchanged moderate right M1 narrowing. Normal left MCA. Posterior communicating arteries: Present bilaterally. Posterior cerebral arteries: No proximal occlusion. Basilar artery: Normal. Vertebral arteries: Left dominant. Normal. Superior cerebellar arteries: Normal. Inferior cerebellar arteries: Right PICA not clearly seen. IMPRESSION: 1. No acute intracranial abnormality. 2. Severe atrophy with findings of chronic ischemic microangiopathy. 3. Unchanged severe stenosis of the distal cavernous segment of the left internal carotid artery. 4. Unchanged moderate right M1 MCA segment narrowing. Electronically Signed   By: Ulyses Jarred M.D.   On: 07/31/2018 05:24   Mr Jodene Nam Head Wo Contrast  Result Date: 07/31/2018 CLINICAL DATA:  Left eye vision loss EXAM: MRI HEAD WITHOUT CONTRAST MRA HEAD WITHOUT CONTRAST TECHNIQUE: Multiplanar, multiecho pulse sequences of the brain and surrounding structures were obtained  without intravenous contrast. Angiographic images of the head were obtained using MRA technique without contrast. COMPARISON:  Head CT 07/30/2018 FINDINGS: MRI HEAD FINDINGS BRAIN: There is no acute infarct, acute hemorrhage or mass effect. The midline structures are normal. There are no old infarcts. Multifocal white matter hyperintensity, most commonly due to chronic ischemic microangiopathy. Advanced atrophy for age. Foci of chronic microhemorrhage in the right frontal and parietal lobes. SKULL AND UPPER CERVICAL SPINE: The visualized skull base, calvarium, upper cervical spine and extracranial soft tissues are normal. SINUSES/ORBITS: No fluid levels or advanced mucosal thickening. No mastoid or middle ear effusion. The orbits are normal. MRA HEAD FINDINGS Intracranial internal carotid arteries: Unchanged severe narrowing of the distal cavernous segment of the left ICA. Anterior cerebral arteries: Normal. Middle cerebral arteries: Unchanged moderate right M1 narrowing. Normal left MCA. Posterior communicating arteries: Present bilaterally. Posterior cerebral arteries: No proximal occlusion. Basilar artery: Normal. Vertebral arteries: Left dominant. Normal. Superior cerebellar arteries: Normal. Inferior cerebellar arteries: Right PICA not clearly seen. IMPRESSION: 1. No acute intracranial abnormality. 2. Severe atrophy with findings of chronic ischemic microangiopathy. 3. Unchanged severe stenosis of the distal cavernous segment of the left internal carotid artery. 4. Unchanged moderate right M1 MCA segment narrowing. Electronically Signed   By: Ulyses Jarred M.D.   On: 07/31/2018 05:24   Ct Head Code Stroke Wo Contrast  Result Date: 07/30/2018 CLINICAL DATA:  Code stroke. Left eye vision loss and facial numbness. EXAM: CT HEAD WITHOUT CONTRAST TECHNIQUE: Contiguous axial images were obtained from the base of the skull through the vertex without intravenous contrast. COMPARISON:  12/28/2017 head CT FINDINGS:  Brain: There is no mass, hemorrhage or extra-axial collection. There is generalized brain atrophy greater than expected at this age. There is hypoattenuation of the periventricular white matter, most commonly indicating chronic ischemic microangiopathy. Vascular: Atherosclerotic calcification of the internal carotid arteries at the  skull base. No abnormal hyperdensity of the major intracranial arteries or dural venous sinuses. Skull: The visualized skull base, calvarium and extracranial soft tissues are normal. Sinuses/Orbits: No fluid levels or advanced mucosal thickening of the visualized paranasal sinuses. No mastoid or middle ear effusion. The orbits are normal. ASPECTS [facility] Stroke Program Early CT Score) - Ganglionic level infarction (caudate, lentiform nuclei, internal capsule, insula, M1-M3 cortex): 7 - Supraganglionic infarction (M4-M6 cortex): 3 Total score (0-10 with 10 being normal): 10 IMPRESSION: 1. No acute hemorrhage. 2. Advanced atrophy and chronic microvascular ischemia. 3. ASPECTS is 10. These results were communicated to Dr. Kerney Elbe at 10:42 pm on 07/30/2018 by text page via the [facility] messaging system. Electronically Signed   By: Ulyses Jarred M.D.   On: 07/30/2018 22:43    TTE  - Normal left ventricular function with EF 60-65%. Aortic valve   sclerosis with mild aortic stenosis, mean systolic gradient 9   mmHg. Calcified mitral annulus with trivial regurgitation. Normal   IVC size and collapsibility.  DSA 1.Approx 60 % stenosis of Lt ICA caval cav segment. 2.Approx 50 % stenosis RT CCA distally. 3.Approx 50 % stenosis RT MCA M1 seg 4.Approx 50 %  To 55 % stenosis Lt CCA mid seg  PHYSICAL EXAM  Temp:  [97.4 F (36.3 C)-98.2 F (36.8 C)] 98.1 F (36.7 C) (11/06 1510) Pulse Rate:  [62-85] 68 (11/06 1645) Resp:  [10-20] 20 (11/06 1600) BP: (74-152)/(37-69) 140/48 (11/06 1645) SpO2:  [93 %-100 %] 93 % (11/06 1510)  General - Well nourished, well developed, in no  apparent distress.  Ophthalmologic - fundi not visualized due to noncooperation.  Cardiovascular - Regular rate and rhythm.  Mental Status -  Level of arousal and orientation to time, place, and person were intact. Language including expression, naming, repetition, comprehension was assessed and found intact. Fund of Knowledge was assessed and was intact.  Cranial Nerves II - XII - II - Visual field intact OU. Bilateral visual acuity FC. Left subconjunctival hemorrhage III, IV, VI - Extraocular movements intact. V - Facial sensation intact bilaterally. VII - Facial movement intact bilaterally. VIII - Hearing & vestibular intact bilaterally. X - Palate elevates symmetrically. XI - Chin turning & shoulder shrug intact bilaterally. XII - Tongue protrusion intact.  Motor Strength - The patient's strength was symmetrical in all extremities and pronator drift was absent.  Bulk was normal and fasciculations were absent.   Motor Tone - Muscle tone was assessed at the neck and appendages and was normal.  Reflexes - The patient's reflexes were symmetrical in all extremities and she had no pathological reflexes.  Sensory - Light touch, temperature/pinprick were assessed and were symmetrical.    Coordination - The patient had normal movements in the hands with no ataxia or dysmetria.   Gait and Station - deferred.   ASSESSMENT/PLAN [name] [name] is a 72 y.o. female with history of intracranial stenosis s/p angioplasty at left ICA siphon, HTN, HLD, COPD, PVD, former smoker admitted for left eye transient blindness and now blurry vision. No tPA given due to OSW.    Left amaurosis fugax - likely due to chronic progressive left ICA cavernous high grade stenosis  Resultant left blurry vision  MRI  No acute infarct  MRA  Left ICA siphon and right M1 chronic severe stenosis  DSA largely unchanged from prior, 60% left ICA cavernous segment stenosis, 50% stenosis of right CCA, right  MCA and left CCA.  2D Echo  EF 60-65%  LDL 103  HgbA1c 5.1  P2 Y 12 - 111  CRP and ESR negative  UDS negative  SCDs for VTE prophylaxis  aspirin 81 mg daily and clopidogrel 75 mg daily prior to admission, now on aspirin 325 mg daily and clopidogrel 75 mg daily.  Continue DAPT on discharge.  Patient counseled to be compliant with her antithrombotic medications  Ongoing aggressive stroke risk factor management  Therapy recommendations:  Pending   Disposition:  Pending   Intermittent right facial numbness  Occurred last night  Resolved today  Repeat MRI brain to rule out acute infarct  If recurrent left MCA infarct, may consider further intervention on left ICA cavernous stenosis.  Severe intracranial stenosis  Has been following with Dr. Estanislado [name] [name] over the years  02/2007 MRA left siphon stenosis  09/2008 MRI showed left siphon stenosis worsening and the right M1 stenosis 50 to 70%  10/2008 DSA status post angioplasty of left ICA siphon. Also showed right M1 90% stenosis  04/2009 left ICA siphons 30 to 50% stenosis and right M1 75% stenosis -put on aspirin and Plavix  11/2009 -left ICA siphon 50 to 65% stenosis in the right M1 75 to 80% stenosis  06/2012 left ICA siphon 50 to 65% stenosis and right M1 70% stenosis as well as left CCA 70% in the right ICA proximal 50% stenosis.  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  Right ICA 50% proximal, left CCA 50%, right subclavian artery, and the bilateral VA origin stenosis.  DSA 08/01/2018 - 60% left ICA cavernous segment stenosis, 50% stenosis of right CCA, right MCA and left CCA.  History of stroke  01/2006 right MCA and left subcortical multi-focal infarcts, MRA  negative  09/2008 left MCA infarcts -MRA showed left siphon worsening stenosis and right M1 50 to 70% stenosis.  Underwent angioplasty of left ICA siphon in 10/2008  Hypertension . Stable at goal . On no BP meds now  Long term BP goal 130-150 given severe intracranial stenosis  Hyperlipidemia  Home meds:  lipitor   LDL 103, goal < 70  Now on lipitor 40  Continue statin at discharge  Other Stroke Risk Factors  Advanced age  Former Cigarette smoker  PVD  Other Active Problems  COPD  Left eye subconjunctival hemorrahge  Hospital day # 0    [name] Hawking, [name] [name] Stroke Neurology 08/01/2018 6:43 PM    To contact Stroke Continuity provider, please refer to [email or web address] After hours, contact General Neurology

## 2018-08-01 NOTE — Progress Notes (Signed)
Await final recommendations from neurology s/p Cerebral angiogram. [name] Canary DO

## 2018-08-01 NOTE — Care Management Note (Signed)
Case Management Note  Patient Details  Name: [name] [name] MRN: [id number] Date of Birth: [birth year]  Subjective/Objective:     Pt in with stroke like symptoms. She is from home with her daughter, [name].  DME: walker, cane, 3 in 1, shower chair.  Pt denies issues obtaining her medications.  Pt denies issues with transportation.               Action/Plan: Pt with orders for [facility] services. CM met with the patient, provided her choice, and she selected [place]. Butch Penny with [facility] notified and accepted the referral.  CM following for further d/c needs.   Expected Discharge Date:                  Expected Discharge Plan:  [place]  In-House Referral:     Discharge planning Services  CM Consult  Post Acute Care Choice:  Home Health Choice offered to:  Patient  DME Arranged:    DME Agency:     HH Arranged:  PT, OT HH Agency:  [place]  Status of Service:  Completed, signed off  If discussed at [place] of Stay Meetings, dates discussed:    Additional Comments:  Pollie Friar, RN 08/01/2018, 3:24 PM

## 2018-08-01 NOTE — Progress Notes (Signed)
SLP Cancellation Note  Patient Details Name: [name] [name] MRN: [id number] DOB: [birth year]   Cancelled treatment:        Patient is at test/procedure. Will return at next available date.   Lindalou Hose Eural Holzschuh, MA, CCC-SLP 08/01/2018 1:52 PM

## 2018-08-01 NOTE — Progress Notes (Signed)
Pt returned from IR at this time.  Alert and oriented.  Daughter at bedside.  Pt denies any pain at present.  Verbalized understanding of having to lay flat x 3 hours beginning 1:45.  VPad in place and CDI;  No swelling or drainage noted.

## 2018-08-01 NOTE — Sedation Documentation (Signed)
Right groin sheath removed. V-Pad applied 

## 2018-08-01 NOTE — Progress Notes (Signed)
PT Cancellation Note  Patient Details Name: [name] [name] MRN: [id number] DOB: 08/10/1946   Cancelled Treatment:    Reason Eval/Treat Not Completed: Patient at procedure or test/unavailable. Pt off of the floor for IR. PT will continue to follow acutely as available.    Alessandra Bevels Tiarah Shisler 08/01/2018, 2:10 PM

## 2018-08-02 ENCOUNTER — Observation Stay (HOSPITAL_COMMUNITY): Payer: Medicare HMO

## 2018-08-02 ENCOUNTER — Other Ambulatory Visit: Payer: Self-pay | Admitting: Neurology

## 2018-08-02 ENCOUNTER — Other Ambulatory Visit: Payer: Self-pay

## 2018-08-02 DIAGNOSIS — D696 Thrombocytopenia, unspecified: Secondary | ICD-10-CM | POA: Diagnosis not present

## 2018-08-02 DIAGNOSIS — R2990 Unspecified symptoms and signs involving the nervous system: Secondary | ICD-10-CM | POA: Diagnosis not present

## 2018-08-02 DIAGNOSIS — E785 Hyperlipidemia, unspecified: Secondary | ICD-10-CM | POA: Diagnosis not present

## 2018-08-02 DIAGNOSIS — I679 Cerebrovascular disease, unspecified: Secondary | ICD-10-CM

## 2018-08-02 DIAGNOSIS — H5462 Unqualified visual loss, left eye, normal vision right eye: Secondary | ICD-10-CM | POA: Diagnosis not present

## 2018-08-02 DIAGNOSIS — I639 Cerebral infarction, unspecified: Secondary | ICD-10-CM | POA: Diagnosis not present

## 2018-08-02 LAB — CBC
HCT: 38 % (ref 36.0–46.0)
Hemoglobin: 11.7 g/dL — ABNORMAL LOW (ref 12.0–15.0)
MCH: 28.6 pg (ref 26.0–34.0)
MCHC: 30.8 g/dL (ref 30.0–36.0)
MCV: 92.9 fL (ref 80.0–100.0)
Platelets: 125 10*3/uL — ABNORMAL LOW (ref 150–400)
RBC: 4.09 MIL/uL (ref 3.87–5.11)
RDW: 12.6 % (ref 11.5–15.5)
WBC: 5.7 10*3/uL (ref 4.0–10.5)
nRBC: 0 % (ref 0.0–0.2)

## 2018-08-02 LAB — BASIC METABOLIC PANEL
Anion gap: 6 (ref 5–15)
BUN: 19 mg/dL (ref 8–23)
CO2: 25 mmol/L (ref 22–32)
Calcium: 8.6 mg/dL — ABNORMAL LOW (ref 8.9–10.3)
Chloride: 108 mmol/L (ref 98–111)
Creatinine, Ser: 1.1 mg/dL — ABNORMAL HIGH (ref 0.44–1.00)
GFR calc Af Amer: 57 mL/min — ABNORMAL LOW (ref >60)
GFR calc non Af Amer: 49 mL/min — ABNORMAL LOW (ref >60)
Glucose, Bld: 93 mg/dL (ref 70–99)
Potassium: 4.2 mmol/L (ref 3.5–5.1)
Sodium: 139 mmol/L (ref 135–145)

## 2018-08-02 MED ORDER — ASPIRIN 325 MG PO TBEC: 325.0000 mg | DELAYED_RELEASE_TABLET | Freq: Every day | ORAL | 0 refills | Status: DC

## 2018-08-02 MED ORDER — UNJURY CHOCOLATE CLASSIC POWDER: 2.0000 [oz_av] | Freq: Four times a day (QID) | ORAL | Status: DC

## 2018-08-02 NOTE — Discharge Summary (Signed)
Physician Discharge Summary  [name] [name] TZG:[id number] DOB: 25-Feb-1946 DOA: 07/30/2018  PCP: [name] Bush, MD  Admit date: 07/30/2018 Discharge date: 08/02/2018  Admitted From: home Discharge disposition: home with daughter   Recommendations for Outpatient Follow-Up:   Eye dr appointment made Long term BP goal 130-150 given severe intracranial stenosis-- may need to stop BP medications   Discharge Diagnosis:   Principal Problem:   Stroke-like symptoms Active Problems:   Hyperlipidemia   [name] [name] (major depressive disorder), recurrent episode, moderate ([name] [name])   Hypothyroidism (acquired)   HTN (hypertension)   Chronic diastolic heart failure ([name] [name])   Vision loss of left eye   Acute metabolic encephalopathy   Thrombocytopenia ([name] [name])    Discharge Condition: Improved.  Diet recommendation: Low sodium, heart healthy.  Wound care: None.  Code status: Full.   History of Present Illness:   [name] [name] is a 72 y.o. female with medical history significant of hypertension, hyperlipidemia, COPD on 1 L nasal cannula oxygen at night, stroke with mild right sided weakness, hypothyroidism, depression, former smoker, PVD, CHF, carotid artery aneurysm, carotid artery stenosis, who presents with vision loss in left eye, left facial droop and left facial numbness.  Patient's daughter, patient suddenly lost vision in left eye at about 9:30 PM.  The symptoms lasted for about 1 hour, then resolved spontaneously.  She was noted to have left medial side eye conjunctiva bleeding.  At the same time, patient also started having left facial numbness and left facial droop, which has been persistent, no significant improvement now.  Patient states that she has minimal right sided weakness from previous stroke, which has not changed.  She also complains of left frontal headache.  No chest pain, shortness breath, cough, fever or chills.  Denies nausea, vomiting, diarrhea or abdominal  pain pain no symptoms of UTI.  Patient is mildly confused, but still oriented x3 now.  Patient is currently being treated for intertrigo.   Hospital Course by Problem:   Left amaurosis fugax - likely due to chronic progressive left ICA cavernous high grade stenosis  MRI  No acute infarct  MRA  Left ICA siphon and right M1 chronic severe stenosis  DSA largely unchanged from prior, 60% left ICA cavernous segment stenosis, 50% stenosis of right CCA, right MCA and left CCA.  2D Echo  EF 60-65%  LDL 103  HgbA1c 5.1  P2 Y 12 - 111  CRP and ESR negative  UDS negative  now on aspirin 325 mg daily and clopidogrel 75 mg daily.  Continue DAPT on discharge. opthalmology follow up made  Intermittent right facial numbness  Occurred 08/01/18, resolved  Repeat MRI brain showed no acute infarct  No further intervention on left ICA cavernous stenosis needed  Severe intracranial stenosis  Has been following with [name] [name] over the years  02/2007 MRA left siphon stenosis  09/2008 MRI showed left siphon stenosis worsening and the right M1 stenosis 50 to 70%  10/2008 DSA status post angioplasty of left ICA siphon. Also showed right M1 90% stenosis  04/2009 left ICA siphons 30 to 50% stenosis and right M1 75% stenosis -put on aspirin and Plavix  11/2009 -left ICA siphon 50 to 65% stenosis in the right M1 75 to 80% stenosis  06/2012 left ICA siphon 50 to 65% stenosis and right M1 70% stenosis as well as left CCA 70% in the right ICA proximal 50% stenosis.  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  Right ICA 50% proximal, left CCA 50%, right subclavian artery, and the bilateral VA origin stenosis.  DSA 08/01/2018 - 60% left ICA cavernous  segment stenosis, 50% stenosis of right CCA, right MCA and left CCA.  History of stroke  01/2006 right MCA and left subcortical multi-focal infarcts, MRA negative  09/2008 left MCA infarcts -MRA showed left siphon worsening stenosis and right M1 50 to 70% stenosis.  Underwent angioplasty of left ICA siphon in 10/2008  Hypertension  Stable on the low side  On no BP meds now  Long term BP goal 130-150 given severe intracranial stenosis  Hyperlipidemia  Home meds:  lipitor   LDL 103, goal < 70  Now on lipitor 40  Continue statin at discharge     Medical Consultants:   neurology   Discharge Exam:    Vitals:   08/02/18 0200 08/02/18 0739 08/02/18 0826   BP: (!) 91/42 (!) 124/52    Pulse: 67 61 72   Resp:  18 18   Temp:  97.7 F (36.5 C)    TempSrc:  Oral    SpO2: 94% 95% 94%     General exam: Appears calm and comfortable.   The results of significant diagnostics from this hospitalization (including imaging, microbiology, ancillary and laboratory) are listed below for reference.     Procedures and Diagnostic Studies:   Mr Brain Wo Contrast  Result Date: 07/31/2018 CLINICAL DATA:  Left eye vision loss EXAM: MRI HEAD WITHOUT CONTRAST MRA HEAD WITHOUT CONTRAST TECHNIQUE: Multiplanar, multiecho pulse sequences of the brain and surrounding structures were obtained without intravenous contrast. Angiographic images of the head were obtained using MRA technique without contrast. COMPARISON:  Head CT 07/30/2018 FINDINGS: MRI HEAD FINDINGS BRAIN: There is no acute infarct, acute hemorrhage or mass effect. The midline structures are normal. There are no old infarcts. Multifocal white matter hyperintensity, most commonly due to chronic ischemic microangiopathy. Advanced atrophy for age. Foci of chronic microhemorrhage in the right frontal and parietal lobes. SKULL AND UPPER CERVICAL SPINE: The visualized skull base, calvarium, upper cervical spine and extracranial soft tissues  are normal. SINUSES/ORBITS: No fluid levels or advanced mucosal thickening. No mastoid or middle ear effusion. The orbits are normal. MRA HEAD FINDINGS Intracranial internal carotid arteries: Unchanged severe narrowing of the distal cavernous segment of the left ICA. Anterior cerebral arteries: Normal. Middle cerebral arteries: Unchanged moderate right M1 narrowing. Normal left MCA. Posterior communicating arteries: Present bilaterally. Posterior cerebral arteries: No proximal occlusion. Basilar artery: Normal. Vertebral arteries: Left dominant. Normal. Superior cerebellar arteries: Normal. Inferior cerebellar arteries: Right PICA not clearly seen. IMPRESSION: 1. No acute intracranial abnormality. 2. Severe atrophy with findings of chronic ischemic microangiopathy. 3. Unchanged severe stenosis of the distal cavernous segment of the left internal carotid artery. 4. Unchanged moderate right M1 MCA segment narrowing. Electronically Signed   By: Ulyses Jarred M.D.   On: 07/31/2018 05:24   Mr Jodene Nam Head Wo Contrast  Result Date: 07/31/2018 CLINICAL DATA:  Left eye vision loss EXAM: MRI HEAD WITHOUT CONTRAST MRA HEAD WITHOUT CONTRAST TECHNIQUE: Multiplanar, multiecho pulse sequences of the brain and surrounding structures were obtained without intravenous contrast. Angiographic images of the head were obtained using MRA technique without contrast. COMPARISON:  Head CT 07/30/2018 FINDINGS: MRI HEAD  FINDINGS BRAIN: There is no acute infarct, acute hemorrhage or mass effect. The midline structures are normal. There are no old infarcts. Multifocal white matter hyperintensity, most commonly due to chronic ischemic microangiopathy. Advanced atrophy for age. Foci of chronic microhemorrhage in the right frontal and parietal lobes. SKULL AND UPPER CERVICAL SPINE: The visualized skull base, calvarium, upper cervical spine and extracranial soft tissues are normal. SINUSES/ORBITS: No fluid levels or advanced mucosal thickening. No  mastoid or middle ear effusion. The orbits are normal. MRA HEAD FINDINGS Intracranial internal carotid arteries: Unchanged severe narrowing of the distal cavernous segment of the left ICA. Anterior cerebral arteries: Normal. Middle cerebral arteries: Unchanged moderate right M1 narrowing. Normal left MCA. Posterior communicating arteries: Present bilaterally. Posterior cerebral arteries: No proximal occlusion. Basilar artery: Normal. Vertebral arteries: Left dominant. Normal. Superior cerebellar arteries: Normal. Inferior cerebellar arteries: Right PICA not clearly seen. IMPRESSION: 1. No acute intracranial abnormality. 2. Severe atrophy with findings of chronic ischemic microangiopathy. 3. Unchanged severe stenosis of the distal cavernous segment of the left internal carotid artery. 4. Unchanged moderate right M1 MCA segment narrowing. Electronically Signed   By: Ulyses Jarred M.D.   On: 07/31/2018 05:24   Ct Head Code Stroke Wo Contrast  Result Date: 07/30/2018 CLINICAL DATA:  Code stroke. Left eye vision loss and facial numbness. EXAM: CT HEAD WITHOUT CONTRAST TECHNIQUE: Contiguous axial images were obtained from the base of the skull through the vertex without intravenous contrast. COMPARISON:  12/28/2017 head CT FINDINGS: Brain: There is no mass, hemorrhage or extra-axial collection. There is generalized brain atrophy greater than expected at this age. There is hypoattenuation of the periventricular white matter, most commonly indicating chronic ischemic microangiopathy. Vascular: Atherosclerotic calcification of the internal carotid arteries at the skull base. No abnormal hyperdensity of the major intracranial arteries or dural venous sinuses. Skull: The visualized skull base, calvarium and extracranial soft tissues are normal. Sinuses/Orbits: No fluid levels or advanced mucosal thickening of the visualized paranasal sinuses. No mastoid or middle ear effusion. The orbits are normal. ASPECTS [facility] Stroke  Program Early CT Score) - Ganglionic level infarction (caudate, lentiform nuclei, internal capsule, insula, M1-M3 cortex): 7 - Supraganglionic infarction (M4-M6 cortex): 3 Total score (0-10 with 10 being normal): 10 IMPRESSION: 1. No acute hemorrhage. 2. Advanced atrophy and chronic microvascular ischemia. 3. ASPECTS is 10. These results were communicated to Dr. Kerney Elbe at 10:42 pm on 07/30/2018 by text page via the [facility] messaging system. Electronically Signed   By: Ulyses Jarred M.D.   On: 07/30/2018 22:43     Labs:   Basic Metabolic Panel: Recent Labs  Lab 07/30/18 2223 07/30/18 2229 08/02/18 0321  NA 138 141 139  K 3.9 3.9 4.2  CL 107 107 108  CO2 24  --  25  GLUCOSE 137* 136* 93  BUN '17 21 19  '$ CREATININE 1.02* 1.00 1.10*  CALCIUM 8.9  --  8.6*   GFR Estimated Creatinine Clearance: 42.7 mL/min (A) (by C-G formula based on SCr of 1.1 mg/dL (H)). Liver Function Tests: Recent Labs  Lab 07/30/18 2223  AST 21  ALT 11  ALKPHOS 106  BILITOT 0.6  PROT 6.9  ALBUMIN 3.6   No results for input(s): LIPASE, AMYLASE in the last 168 hours. No results for input(s): AMMONIA in the last 168 hours. Coagulation profile Recent Labs  Lab 07/30/18 2223  INR 1.02    CBC: Recent Labs  Lab 07/30/18 2223 07/30/18 2229 08/02/18 0321  WBC 5.9  --  5.7  NEUTROABS 3.7  --   --   HGB 12.9 12.9 11.7*  HCT 42.1 38.0 38.0  MCV 95.5  --  92.9  PLT 118*  --  125*   Cardiac Enzymes: No results for input(s): CKTOTAL, CKMB, CKMBINDEX, TROPONINI in the last 168 hours. BNP: Invalid input(s): POCBNP CBG: Recent Labs  Lab 07/30/18 2223  GLUCAP 134*   D-Dimer No results for input(s): DDIMER in the last 72 hours. Hgb A1c Recent Labs    07/31/18 0217  HGBA1C 5.1   Lipid Profile Recent Labs    07/31/18 0217  CHOL 183  HDL 64  LDLCALC 103*  TRIG 79  CHOLHDL 2.9   Thyroid function studies No results for input(s): TSH, T4TOTAL, T3FREE, THYROIDAB in the last 72  hours.  Invalid input(s): FREET3 Anemia work up No results for input(s): VITAMINB12, FOLATE, FERRITIN, TIBC, IRON, RETICCTPCT in the last 72 hours. Microbiology No results found for this or any previous visit (from the past 240 hour(s)).   Discharge Instructions:   Discharge Instructions    Diet - low sodium heart healthy   Complete by:  As directed    Increase activity slowly   Complete by:  As directed      Allergies as of 08/02/2018      Reactions   Doxycycline Other (See Comments)   Sig GI upset      Medication List    TAKE these medications   acetaminophen 500 MG tablet Commonly known as:  TYLENOL Take 500 mg by mouth 2 (two) times daily as needed for mild pain.   albuterol 108 (90 Base) MCG/ACT inhaler Commonly known as:  PROVENTIL HFA;VENTOLIN HFA Inhale 2 puffs into the lungs every 6 (six) hours as needed for wheezing or shortness of breath.   alendronate 70 MG tablet Commonly known as:  FOSAMAX TAKE 1 TABLET WEEKLY  WITH A FULL GLASS OF WATER ON AN EMPTY STOMACH What changed:  See the new instructions.   aspirin 325 MG EC tablet Take 1 tablet (325 mg total) by mouth daily. Start taking on:  08/03/2018 What changed:    medication strength  how much to take   atorvastatin 40 MG tablet Commonly known as:  LIPITOR TAKE 1 TABLET (40 MG TOTAL) BY MOUTH DAILY.   benazepril 5 MG tablet Commonly known as:  LOTENSIN Take 1 tablet (5 mg total) by mouth daily.   Calcium-Vitamin D 600-400 MG-UNIT Tabs Take 1 tablet by mouth daily.   clopidogrel 75 MG tablet Commonly known as:  PLAVIX TAKE 1 TABLET EVERY DAY   clotrimazole-betamethasone cream Commonly known as:  LOTRISONE Apply 1 application topically 2 (two) times daily.   fexofenadine 180 MG tablet Commonly known as:  ALLEGRA Take 180 mg by mouth daily as needed for allergies.   fluconazole 150 MG tablet Commonly known as:  DIFLUCAN Take 1 tablet (150 mg total) by mouth once a week.    fluticasone-salmeterol 115-21 MCG/ACT inhaler Commonly known as:  ADVAIR HFA Inhale 2 puffs into the lungs 2 (two) times daily. Rinse mouth after use.   furosemide 20 MG tablet Commonly known as:  LASIX Take 1 tablet (20 mg total) by mouth as needed for fluid or edema. May take once or twice a week for swelling   levothyroxine 50 MCG tablet Commonly known as:  SYNTHROID, LEVOTHROID Take 1 tablet (50 mcg total) by mouth daily before breakfast.   nystatin powder Commonly known as:  MYCOSTATIN/NYSTOP Apply topically 3 (three) times daily.  Potassium Chloride ER 20 MEQ Tbcr Take 20 mEq by mouth as needed (whenever you take lasix).   sertraline 100 MG tablet Commonly known as:  ZOLOFT Take 1 tablet (100 mg total) by mouth daily.   traMADol 50 MG tablet Commonly known as:  ULTRAM Take 1-2 tablets (50-100 mg total) by mouth 3 (three) times daily as needed for moderate pain.   traZODone 100 MG tablet Commonly known as:  DESYREL Take 1 tablet (100 mg total) by mouth at bedtime.   VITAMIN D PO Take 1 capsule by mouth daily.      Follow-up Information    [name] Bush, MD Follow up in 1 week(s).   Specialty:  Family Medicine Contact information: [place] [address] [phone]            Time coordinating discharge: 25 min  Signed:  Geradine Girt DO  [facility] 08/02/2018, 2:54 PM

## 2018-08-02 NOTE — Plan of Care (Signed)
Adequate for discharge.

## 2018-08-02 NOTE — Care Management Note (Signed)
Case Management Note  Patient Details  Name: MARLY SCHULD MRN: [id number] Date of Birth: [birth year]  Subjective/Objective:                    Action/Plan: Pt discharging home today with her daughter. Daughter can provide supervision at home and transport to home.   Expected Discharge Date:  08/02/18               Expected Discharge Plan:  Home w Home Health Services  In-House Referral:     Discharge planning Services  CM Consult  Post Acute Care Choice:  Home Health Choice offered to:  Patient  DME Arranged:    DME Agency:     HH Arranged:  PT, OT HH Agency:  [facility]  Status of Service:  Completed, signed off  If discussed at Long Length of Stay Meetings, dates discussed:    Additional Comments:  Kermit Balo, RN 08/02/2018, 3:15 PM

## 2018-08-02 NOTE — Evaluation (Signed)
Speech Language Pathology Evaluation Patient Details Name: [name] [name] MRN: [id number] DOB: June 03, 1946 Today's Date: 08/02/2018 Time: 8657-8469 SLP Time Calculation (min) (ACUTE ONLY): 15 min  Problem List:  Patient Active Problem List   Diagnosis Date Noted  . Vision loss of left eye 07/31/2018  . Acute metabolic encephalopathy 07/31/2018  . Thrombocytopenia (HCC) 07/31/2018  . Stroke-like symptoms 07/31/2018  . Numbness   . Tremor 07/03/2018  . Weakness 05/07/2018  . Chronic diastolic heart failure (HCC) 01/30/2018  . Hyperkalemia 01/30/2018  . Memory loss 01/30/2018  . Peripheral edema 01/30/2018  . Intertrigo 01/30/2018  . Chronic back pain 11/21/2017  . Chronic respiratory failure with hypoxia (HCC) 11/14/2017  . SOB (shortness of breath) 11/14/2017  . Closed compression fracture of fourth lumbar vertebra (HCC) 10/02/2017  . Compression fracture of body of thoracic vertebra (HCC) 09/30/2017  . Hypothyroidism (acquired) 06/28/2017  . Vitamin D deficiency 06/25/2017  . Advanced care planning/counseling discussion 01/29/2015  . Encounter for general adult medical examination with abnormal findings 01/29/2015  . Carotid stenosis 04/03/2014  . Carotid artery aneurysm (HCC) 04/03/2014  . Recurrent falls 01/02/2014  . Hemiparesis affecting right side as late effect of stroke (HCC) 01/02/2014  . Insomnia 01/10/2013  . Medicare annual wellness visit, subsequent 11/26/2012  . Hypertension   . Stroke (HCC)   . Peripheral vascular disease (HCC)   . COPD (chronic obstructive pulmonary disease) (HCC)   . Hyperlipidemia   . Osteoarthritis   . MDD (major depressive disorder), recurrent episode, moderate (HCC)   . Ex-smoker   . HTN (hypertension) 02/15/2012  . Osteoporosis 11/25/2010   Past Medical History:  Past Medical History:  Diagnosis Date  . Aneurysm (HCC)   . Anxiety   . Anxiety and depression   . Carotid stenosis    R 50% (12/2012)  . Concussion 08/03/2015  .  COPD (chronic obstructive pulmonary disease) (HCC) 12/2012   spirometry: Pre: FVC 84%, FEV1 69%, ratio 0.64 consistent with moderate obstruction.  . Depression   . Fall 08/03/2015   d/c home health 08/2015  . Fracture of cervical vertebra, C5 (HCC) 08/06/2015  . History of chicken pox   . Hyperlipidemia   . Hypertension   . Lower back pain    h/o HNP s/p surgery  . Osteoarthritis    h/o ruptured disc s/p ESI  . Osteoporosis 11/2010   DEXA -2.7 spine, thoracic compression fracture  . Peripheral vascular disease (HCC)   . Smoker    quit 10/2012  . Stroke [facility]) 2010   x3 with residual R hemiparesis, s/p R MCA balloon angioplasty (2010)   Past Surgical History:  Past Surgical History:  Procedure Laterality Date  . APPENDECTOMY  1960  . CATARACT EXTRACTION     bilateral  . CESAREAN SECTION    . CHOLECYSTECTOMY  1970  . COLONOSCOPY  2004   diverticulosis, no polyps Jarold Motto)  . COLONOSCOPY  05/2016   decreased sphincter tone, diverticulosis, no f/u recommended (Danis)  . DEXA  11/2010   T -2.7 spine, -1.9 hip  . HIP SURGERY Left 2006   fractured - screws placed  . IR RADIOLOGIST EVAL & MGMT  01/09/2018  . KYPHOPLASTY  10/02/2017   Procedure: LUMBAR FOUR KYPHOPLASTY;  Surgeon: Coletta Memos, MD;  Location: [facility] OR;  Service: Neurosurgery;;  . RADIOLOGY WITH ANESTHESIA N/A 11/11/2015   Procedure: RADIOLOGY WITH ANESTHESIA;  Surgeon: Julieanne Cotton, MD;  Location: [facility] OR;  Service: Radiology;  Laterality: N/A;   HPI:  72 y.o. female with history of intracranial stenosis s/p angioplasty at left ICA siphon, HTN, HLD, COPD, PVD, former smoker admitted for left eye transient blindness and now blurry vision. No tPA given due to OSW.  Dx Left amaurosis fugax - likely due to chronic progressive left ICA cavernous high grade stenosis.  MRI negative for acute infarct.    Assessment / Plan / Recommendation Clinical Impression  Pt presents with baseline cognitive-linguistic function marked by  deficits in short-term memory and attention, confirmed by daughter who was present for exam.  Expression/comprehension are WNL; speech is clear and fluent.  CN asymmetry has resolved.  Pt lives sedentary lifestyle; watches tv most of the day.  No SLP needs are identified - our service will sign off.     SLP Assessment  SLP Recommendation/Assessment: Patient does not need any further Speech Lanaguage Pathology Services    Follow Up Recommendations  None    Frequency and Duration           SLP Evaluation Cognition  Overall Cognitive Status: History of cognitive impairments - at baseline Arousal/Alertness: Awake/alert Orientation Level: Oriented X4 Attention: Selective Selective Attention: Impaired Selective Attention Impairment: Verbal basic Memory: Impaired Memory Impairment: Retrieval deficit Awareness: Appears intact Problem Solving: Appears intact       Comprehension  Auditory Comprehension Overall Auditory Comprehension: Appears within functional limits for tasks assessed    Expression Expression Primary Mode of Expression: Verbal Verbal Expression Overall Verbal Expression: Appears within functional limits for tasks assessed Written Expression Dominant Hand: Right Written Expression: Not tested   Oral / Motor  Oral Motor/Sensory Function Overall Oral Motor/Sensory Function: Within functional limits Motor Speech Overall Motor Speech: Appears within functional limits for tasks assessed   GO                    Blenda Mounts Laurice 08/02/2018, 1:36 PM   Ebone Alcivar L. Samson Frederic, MA CCC/SLP Acute Rehabilitation Services Office number [phone] Pager [phone]

## 2018-08-02 NOTE — Progress Notes (Signed)
STROKE TEAM PROGRESS NOTE   SUBJECTIVE (INTERVAL HISTORY) No acute event overnight. Neuro stable. BP on the low side overnight, asymptomatic. This am BP stable. Had MRI overnight showed no acute infarct. No recommendation of any procedure at this time. Pt is happy to have no further procedures.    OBJECTIVE Temp:  [97.4 F (36.3 C)-98.1 F (36.7 C)] 97.7 F (36.5 C) (11/07 0739) Pulse Rate:  [61-77] 61 (11/07 0739) Cardiac Rhythm: Normal sinus rhythm (11/06 1426) Resp:  [10-20] 18 (11/07 0739) BP: (90-152)/(33-84) 124/52 (11/07 0739) SpO2:  [93 %-100 %] 95 % (11/07 0739)  Recent Labs  Lab 07/30/18 2223  GLUCAP 134*   Recent Labs  Lab 07/30/18 2223 07/30/18 2229 08/02/18 0321  NA 138 141 139  K 3.9 3.9 4.2  CL 107 107 108  CO2 24  --  25  GLUCOSE 137* 136* 93  BUN '17 21 19  '$ CREATININE 1.02* 1.00 1.10*  CALCIUM 8.9  --  8.6*   Recent Labs  Lab 07/30/18 2223  AST 21  ALT 11  ALKPHOS 106  BILITOT 0.6  PROT 6.9  ALBUMIN 3.6   Recent Labs  Lab 07/30/18 2223 07/30/18 2229 08/02/18 0321  WBC 5.9  --  5.7  NEUTROABS 3.7  --   --   HGB 12.9 12.9 11.7*  HCT 42.1 38.0 38.0  MCV 95.5  --  92.9  PLT 118*  --  125*   No results for input(s): CKTOTAL, CKMB, CKMBINDEX, TROPONINI in the last 168 hours. Recent Labs    07/30/18 2223  LABPROT 13.3  INR 1.02   Recent Labs    07/31/18 0817  COLORURINE YELLOW  LABSPEC 1.010  PHURINE 5.0  GLUCOSEU NEGATIVE  HGBUR NEGATIVE  BILIRUBINUR NEGATIVE  KETONESUR NEGATIVE  PROTEINUR NEGATIVE  NITRITE NEGATIVE  LEUKOCYTESUR NEGATIVE       Component Value Date/Time   CHOL 183 07/31/2018 0217   TRIG 79 07/31/2018 0217   HDL 64 07/31/2018 0217   CHOLHDL 2.9 07/31/2018 0217   VLDL 16 07/31/2018 0217   LDLCALC 103 (H) 07/31/2018 0217   Lab Results  Component Value Date   HGBA1C 5.1 07/31/2018      Component Value Date/Time   LABOPIA NONE DETECTED 07/31/2018 0817   COCAINSCRNUR NONE DETECTED 07/31/2018 0817    LABBENZ NONE DETECTED 07/31/2018 0817   AMPHETMU NONE DETECTED 07/31/2018 0817   THCU NONE DETECTED 07/31/2018 0817   LABBARB NONE DETECTED 07/31/2018 0817    No results for input(s): ETH in the last 168 hours.  I have personally reviewed the radiological images below and agree with the radiology interpretations.  Ct Angio Head W Or Wo Contrast  Result Date: 07/18/2018 CLINICAL DATA:  Followup intracranial and extracranial arterial stenoses. EXAM: CT ANGIOGRAPHY HEAD AND NECK TECHNIQUE: Multidetector CT imaging of the head and neck was performed using the standard protocol during bolus administration of intravenous contrast. Multiplanar CT image reconstructions and MIPs were obtained to evaluate the vascular anatomy. Carotid stenosis measurements (when applicable) are obtained utilizing NASCET criteria, using the distal internal carotid diameter as the denominator. CONTRAST:  66m ISOVUE-370 IOPAMIDOL (ISOVUE-370) INJECTION 76% COMPARISON:  12/28/2017 FINDINGS: CT HEAD FINDINGS Brain: There is no evidence of acute infarct, intracranial hemorrhage, mass, midline shift, or extra-axial fluid collection. Advanced atrophy is again noted in the parietal greater than frontal lobes bilaterally, unchanged. Patchy cerebral white matter hypodensities are unchanged and nonspecific but compatible with mild chronic small vessel ischemic disease. Vascular: Calcified atherosclerosis at the  skull base. Skull: No fracture or focal osseous lesion. Sinuses: Clear paranasal sinuses and mastoid air cells. Orbits: Bilateral cataract extraction. Review of the MIP images confirms the above findings CTA NECK FINDINGS Aortic arch: Standard 3 vessel aortic arch with moderate calcified and soft plaque. Calcified and soft plaque at the right subclavian artery origin with unchanged severe stenosis. Patent left subclavian artery with diffuse atherosclerosis but no significant proximal stenosis. Right carotid system: Patent common  carotid artery with limited assessment of its origin due to motion artifact though with suspected at least mild stenosis. Additional calcified and soft plaque in the mid and distal common carotid artery resulting in up to mild stenosis. Extensive calcified plaque at the carotid bifurcation results in 50% ICA origin stenosis, unchanged. Left carotid system: Patent common carotid artery with primarily soft plaque resulting in up to 55% stenosis, not significantly changed though better demonstrated on today's study. 50% stenosis of the mid common carotid artery associated with a short dissection flap/web or focus of plaque ulceration is unchanged. Predominantly calcified plaque in the carotid bulb results in less than 50% narrowing, unchanged. Vertebral arteries: The vertebral arteries are patent with the left being strongly dominant. Severe right and moderate left vertebral artery origin stenoses are unchanged. Skeleton: Mild-to-moderate lower cervical disc degeneration. Other neck: No evidence of acute abnormality or mass. Upper chest: Mild centrilobular emphysema. Small amount of material in the right aspect of the trachea near the thoracic inlet with a different configuration than on the prior study favored to represent secretions. Review of the MIP images confirms the above findings CTA HEAD FINDINGS Anterior circulation: The internal carotid arteries are patent from skull base to carotid termini with similar appearance of extensive atherosclerosis bilaterally including severe distal left cavernous and moderate right anterior genu stenoses. Focally ectasia of the right cavernous ICA is unchanged. ACAs and MCAs are patent with stable to mildly progressive severe tandem right M1 stenoses. Posterior circulation: The intracranial vertebral arteries are widely patent to the basilar. Patent left PICA, bilateral AICA, and bilateral SCA origins are visualized. The basilar artery is widely patent. Posterior communicating  arteries are not identified. The PCAs are patent with atherosclerotic irregularity bilaterally and mild right and severe left P2 stenoses, stable to slightly progressed. No aneurysm is identified. Venous sinuses: Patent. Anatomic variants: None. Delayed phase: No abnormal enhancement. Review of the MIP images confirms the above findings IMPRESSION: 1. Advanced intracranial atherosclerosis including unchanged severe left cavernous ICA stenosis and stable to slight progression of severe right M1 and left P2 stenoses. 2. Unchanged atherosclerosis in the neck including 50% right ICA origin stenosis, 50-55% left common carotid artery stenoses, severe right subclavian artery origin stenosis, and severe right and moderate left vertebral artery origin stenoses. 3. No evidence of acute intracranial abnormality. 4. Unchanged frontoparietal atrophy. 5. Aortic Atherosclerosis (ICD10-I70.0) and Emphysema (ICD10-J43.9). Electronically Signed   By: Logan Bores M.D.   On: 07/18/2018 18:14   Ct Angio Neck W Or Wo Contrast  Result Date: 07/18/2018 CLINICAL DATA:  Followup intracranial and extracranial arterial stenoses. EXAM: CT ANGIOGRAPHY HEAD AND NECK TECHNIQUE: Multidetector CT imaging of the head and neck was performed using the standard protocol during bolus administration of intravenous contrast. Multiplanar CT image reconstructions and MIPs were obtained to evaluate the vascular anatomy. Carotid stenosis measurements (when applicable) are obtained utilizing NASCET criteria, using the distal internal carotid diameter as the denominator. CONTRAST:  29m ISOVUE-370 IOPAMIDOL (ISOVUE-370) INJECTION 76% COMPARISON:  12/28/2017 FINDINGS: CT HEAD FINDINGS Brain: There  is no evidence of acute infarct, intracranial hemorrhage, mass, midline shift, or extra-axial fluid collection. Advanced atrophy is again noted in the parietal greater than frontal lobes bilaterally, unchanged. Patchy cerebral white matter hypodensities are  unchanged and nonspecific but compatible with mild chronic small vessel ischemic disease. Vascular: Calcified atherosclerosis at the skull base. Skull: No fracture or focal osseous lesion. Sinuses: Clear paranasal sinuses and mastoid air cells. Orbits: Bilateral cataract extraction. Review of the MIP images confirms the above findings CTA NECK FINDINGS Aortic arch: Standard 3 vessel aortic arch with moderate calcified and soft plaque. Calcified and soft plaque at the right subclavian artery origin with unchanged severe stenosis. Patent left subclavian artery with diffuse atherosclerosis but no significant proximal stenosis. Right carotid system: Patent common carotid artery with limited assessment of its origin due to motion artifact though with suspected at least mild stenosis. Additional calcified and soft plaque in the mid and distal common carotid artery resulting in up to mild stenosis. Extensive calcified plaque at the carotid bifurcation results in 50% ICA origin stenosis, unchanged. Left carotid system: Patent common carotid artery with primarily soft plaque resulting in up to 55% stenosis, not significantly changed though better demonstrated on today's study. 50% stenosis of the mid common carotid artery associated with a short dissection flap/web or focus of plaque ulceration is unchanged. Predominantly calcified plaque in the carotid bulb results in less than 50% narrowing, unchanged. Vertebral arteries: The vertebral arteries are patent with the left being strongly dominant. Severe right and moderate left vertebral artery origin stenoses are unchanged. Skeleton: Mild-to-moderate lower cervical disc degeneration. Other neck: No evidence of acute abnormality or mass. Upper chest: Mild centrilobular emphysema. Small amount of material in the right aspect of the trachea near the thoracic inlet with a different configuration than on the prior study favored to represent secretions. Review of the MIP images  confirms the above findings CTA HEAD FINDINGS Anterior circulation: The internal carotid arteries are patent from skull base to carotid termini with similar appearance of extensive atherosclerosis bilaterally including severe distal left cavernous and moderate right anterior genu stenoses. Focally ectasia of the right cavernous ICA is unchanged. ACAs and MCAs are patent with stable to mildly progressive severe tandem right M1 stenoses. Posterior circulation: The intracranial vertebral arteries are widely patent to the basilar. Patent left PICA, bilateral AICA, and bilateral SCA origins are visualized. The basilar artery is widely patent. Posterior communicating arteries are not identified. The PCAs are patent with atherosclerotic irregularity bilaterally and mild right and severe left P2 stenoses, stable to slightly progressed. No aneurysm is identified. Venous sinuses: Patent. Anatomic variants: None. Delayed phase: No abnormal enhancement. Review of the MIP images confirms the above findings IMPRESSION: 1. Advanced intracranial atherosclerosis including unchanged severe left cavernous ICA stenosis and stable to slight progression of severe right M1 and left P2 stenoses. 2. Unchanged atherosclerosis in the neck including 50% right ICA origin stenosis, 50-55% left common carotid artery stenoses, severe right subclavian artery origin stenosis, and severe right and moderate left vertebral artery origin stenoses. 3. No evidence of acute intracranial abnormality. 4. Unchanged frontoparietal atrophy. 5. Aortic Atherosclerosis (ICD10-I70.0) and Emphysema (ICD10-J43.9). Electronically Signed   By: Logan Bores M.D.   On: 07/18/2018 18:14   Mr Brain Wo Contrast  Result Date: 07/31/2018 CLINICAL DATA:  Left eye vision loss EXAM: MRI HEAD WITHOUT CONTRAST MRA HEAD WITHOUT CONTRAST TECHNIQUE: Multiplanar, multiecho pulse sequences of the brain and surrounding structures were obtained without intravenous contrast.  Angiographic images of  the head were obtained using MRA technique without contrast. COMPARISON:  Head CT 07/30/2018 FINDINGS: MRI HEAD FINDINGS BRAIN: There is no acute infarct, acute hemorrhage or mass effect. The midline structures are normal. There are no old infarcts. Multifocal white matter hyperintensity, most commonly due to chronic ischemic microangiopathy. Advanced atrophy for age. Foci of chronic microhemorrhage in the right frontal and parietal lobes. SKULL AND UPPER CERVICAL SPINE: The visualized skull base, calvarium, upper cervical spine and extracranial soft tissues are normal. SINUSES/ORBITS: No fluid levels or advanced mucosal thickening. No mastoid or middle ear effusion. The orbits are normal. MRA HEAD FINDINGS Intracranial internal carotid arteries: Unchanged severe narrowing of the distal cavernous segment of the left ICA. Anterior cerebral arteries: Normal. Middle cerebral arteries: Unchanged moderate right M1 narrowing. Normal left MCA. Posterior communicating arteries: Present bilaterally. Posterior cerebral arteries: No proximal occlusion. Basilar artery: Normal. Vertebral arteries: Left dominant. Normal. Superior cerebellar arteries: Normal. Inferior cerebellar arteries: Right PICA not clearly seen. IMPRESSION: 1. No acute intracranial abnormality. 2. Severe atrophy with findings of chronic ischemic microangiopathy. 3. Unchanged severe stenosis of the distal cavernous segment of the left internal carotid artery. 4. Unchanged moderate right M1 MCA segment narrowing. Electronically Signed   By: Ulyses Jarred M.D.   On: 07/31/2018 05:24   Mr Jodene Nam Head Wo Contrast  Result Date: 07/31/2018 CLINICAL DATA:  Left eye vision loss EXAM: MRI HEAD WITHOUT CONTRAST MRA HEAD WITHOUT CONTRAST TECHNIQUE: Multiplanar, multiecho pulse sequences of the brain and surrounding structures were obtained without intravenous contrast. Angiographic images of the head were obtained using MRA technique without  contrast. COMPARISON:  Head CT 07/30/2018 FINDINGS: MRI HEAD FINDINGS BRAIN: There is no acute infarct, acute hemorrhage or mass effect. The midline structures are normal. There are no old infarcts. Multifocal white matter hyperintensity, most commonly due to chronic ischemic microangiopathy. Advanced atrophy for age. Foci of chronic microhemorrhage in the right frontal and parietal lobes. SKULL AND UPPER CERVICAL SPINE: The visualized skull base, calvarium, upper cervical spine and extracranial soft tissues are normal. SINUSES/ORBITS: No fluid levels or advanced mucosal thickening. No mastoid or middle ear effusion. The orbits are normal. MRA HEAD FINDINGS Intracranial internal carotid arteries: Unchanged severe narrowing of the distal cavernous segment of the left ICA. Anterior cerebral arteries: Normal. Middle cerebral arteries: Unchanged moderate right M1 narrowing. Normal left MCA. Posterior communicating arteries: Present bilaterally. Posterior cerebral arteries: No proximal occlusion. Basilar artery: Normal. Vertebral arteries: Left dominant. Normal. Superior cerebellar arteries: Normal. Inferior cerebellar arteries: Right PICA not clearly seen. IMPRESSION: 1. No acute intracranial abnormality. 2. Severe atrophy with findings of chronic ischemic microangiopathy. 3. Unchanged severe stenosis of the distal cavernous segment of the left internal carotid artery. 4. Unchanged moderate right M1 MCA segment narrowing. Electronically Signed   By: Ulyses Jarred M.D.   On: 07/31/2018 05:24   Ct Head Code Stroke Wo Contrast  Result Date: 07/30/2018 CLINICAL DATA:  Code stroke. Left eye vision loss and facial numbness. EXAM: CT HEAD WITHOUT CONTRAST TECHNIQUE: Contiguous axial images were obtained from the base of the skull through the vertex without intravenous contrast. COMPARISON:  12/28/2017 head CT FINDINGS: Brain: There is no mass, hemorrhage or extra-axial collection. There is generalized brain atrophy greater  than expected at this age. There is hypoattenuation of the periventricular white matter, most commonly indicating chronic ischemic microangiopathy. Vascular: Atherosclerotic calcification of the internal carotid arteries at the skull base. No abnormal hyperdensity of the major intracranial arteries or dural venous sinuses. Skull: The visualized  skull base, calvarium and extracranial soft tissues are normal. Sinuses/Orbits: No fluid levels or advanced mucosal thickening of the visualized paranasal sinuses. No mastoid or middle ear effusion. The orbits are normal. ASPECTS [facility] Stroke Program Early CT Score) - Ganglionic level infarction (caudate, lentiform nuclei, internal capsule, insula, M1-M3 cortex): 7 - Supraganglionic infarction (M4-M6 cortex): 3 Total score (0-10 with 10 being normal): 10 IMPRESSION: 1. No acute hemorrhage. 2. Advanced atrophy and chronic microvascular ischemia. 3. ASPECTS is 10. These results were communicated to Dr. Kerney Elbe at 10:42 pm on 07/30/2018 by text page via the [facility] messaging system. Electronically Signed   By: Ulyses Jarred M.D.   On: 07/30/2018 22:43    TTE  - Normal left ventricular function with EF 60-65%. Aortic valve   sclerosis with mild aortic stenosis, mean systolic gradient 9   mmHg. Calcified mitral annulus with trivial regurgitation. Normal   IVC size and collapsibility.  DSA 1.Approx 60 % stenosis of Lt ICA caval cav segment. 2.Approx 50 % stenosis RT CCA distally. 3.Approx 50 % stenosis RT MCA M1 seg 4.Approx 50 %  To 55 % stenosis Lt CCA mid seg  Mr Brain Wo Contrast  Result Date: 08/02/2018 CLINICAL DATA:  72 year old female with left eye vision loss, episodes of right side numbness now. Diffusion weighted imaging only requested in follow-up to brain MRI and MRA 07/31/2018. EXAM: LIMITED MRI HEAD WITHOUT CONTRAST TECHNIQUE: MRI of the brain and surrounding structures limited to diffusion-weighted imaging without intravenous contrast.  COMPARISON:  Brain MRI and MRA 07/31/2018, and earlier. FINDINGS: No restricted diffusion. Axial and coronal diffusion weighted imaging appears stable since 07/31/2018. Stable cerebral volume and morphology.  No intracranial mass effect. IMPRESSION: Diffusion-weighted imaging of the brain is stable and negative for acute infarct. Electronically Signed   By: Genevie Ann M.D.   On: 08/02/2018 08:12     PHYSICAL EXAM  Temp:  [97.4 F (36.3 C)-98.1 F (36.7 C)] 97.7 F (36.5 C) (11/07 0739) Pulse Rate:  [61-77] 61 (11/07 0739) Resp:  [10-20] 18 (11/07 0739) BP: (90-152)/(33-84) 124/52 (11/07 0739) SpO2:  [93 %-100 %] 95 % (11/07 0739)  General - Well nourished, well developed, in no apparent distress.  Ophthalmologic - fundi not visualized due to noncooperation.  Cardiovascular - Regular rate and rhythm.  Mental Status -  Level of arousal and orientation to time, place, and person were intact. Language including expression, naming, repetition, comprehension was assessed and found intact. Fund of Knowledge was assessed and was intact.  Cranial Nerves II - XII - II - Visual field intact OU. Bilateral visual acuity FC. Left subconjunctival hemorrhage III, IV, VI - Extraocular movements intact. V - Facial sensation intact bilaterally. VII - Facial movement intact bilaterally. VIII - Hearing & vestibular intact bilaterally. X - Palate elevates symmetrically. XI - Chin turning & shoulder shrug intact bilaterally. XII - Tongue protrusion intact.  Motor Strength - The patient's strength was symmetrical in all extremities and pronator drift was absent.  Bulk was normal and fasciculations were absent.   Motor Tone - Muscle tone was assessed at the neck and appendages and was normal.  Reflexes - The patient's reflexes were symmetrical in all extremities and she had no pathological reflexes.  Sensory - Light touch, temperature/pinprick were assessed and were symmetrical.    Coordination - The  patient had normal movements in the hands with no ataxia or dysmetria.   Gait and Station - deferred.   ASSESSMENT/PLAN [name] [name] is a  72 y.o. female with history of intracranial stenosis s/p angioplasty at left ICA siphon, HTN, HLD, COPD, PVD, former smoker admitted for left eye transient blindness and now blurry vision. No tPA given due to OSW.    Left amaurosis fugax - likely due to chronic progressive left ICA cavernous high grade stenosis  Resultant left blurry vision  MRI  No acute infarct  MRA  Left ICA siphon and right M1 chronic severe stenosis  DSA largely unchanged from prior, 60% left ICA cavernous segment stenosis, 50% stenosis of right CCA, right MCA and left CCA.  2D Echo  EF 60-65%  LDL 103  HgbA1c 5.1  P2 Y 12 - 111  CRP and ESR negative  UDS negative  SCDs for VTE prophylaxis  aspirin 81 mg daily and clopidogrel 75 mg daily prior to admission, now on aspirin 325 mg daily and clopidogrel 75 mg daily.  Continue DAPT on discharge.  Patient counseled to be compliant with her antithrombotic medications  Ongoing aggressive stroke risk factor management  Therapy recommendations:  Pending   Disposition:  Pending   Intermittent right facial numbness  Occurred 08/01/18, resolved  Repeat MRI brain showed no acute infarct  No further intervention on left ICA cavernous stenosis needed  Severe intracranial stenosis  Has been following with Dr. Estanislado Pandy over the years  02/2007 MRA left siphon stenosis  09/2008 MRI showed left siphon stenosis worsening and the right M1 stenosis 50 to 70%  10/2008 DSA status post angioplasty of left ICA siphon. Also showed right M1 90% stenosis  04/2009 left ICA siphons 30 to 50% stenosis and right M1 75% stenosis -put on aspirin and Plavix  11/2009 -left ICA siphon 50 to 65% stenosis in the right M1 75 to 80% stenosis  06/2012 left ICA siphon 50 to 65% stenosis and right M1 70% stenosis as well as left CCA 70%  in the right ICA proximal 50% stenosis.  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  Right ICA 50% proximal, left CCA 50%, right subclavian artery, and the bilateral VA origin stenosis.  DSA 08/01/2018 - 60% left ICA cavernous segment stenosis, 50% stenosis of right CCA, right MCA and left CCA.  History of stroke  01/2006 right MCA and left subcortical multi-focal infarcts, MRA negative  09/2008 left MCA infarcts -MRA showed left siphon worsening stenosis and right M1 50 to 70% stenosis.  Underwent angioplasty of left ICA siphon in 10/2008  Hypertension . Stable on the low side . On no BP meds now  Long term BP goal 130-150 given severe intracranial stenosis  Hyperlipidemia  Home meds:  lipitor   LDL 103, goal < 70  Now on lipitor 40  Continue statin at discharge  Other Stroke Risk Factors  Advanced age  Former Cigarette smoker  PVD  Other Active Problems  COPD  Left eye subconjunctival hemorrahge  Hospital day # 0  Neurology will sign off. Please call with questions. Pt will follow up with Dr. Leonie Man at [facility] in about 4 weeks. Thanks for the consult.   Rosalin Hawking, MD PhD Stroke Neurology 08/02/2018 11:44 PM      To contact Stroke Continuity provider, please refer to [email or web address] After hours, contact General Neurology

## 2018-08-02 NOTE — Care Management Obs Status (Signed)
MEDICARE OBSERVATION STATUS NOTIFICATION   Patient Details  Name: [name] MRN: [id number] Date of Birth: [birth year]   Medicare Observation Status Notification Given:       Kermit Balo, RN 08/02/2018, 2:36 PM

## 2018-08-03 ENCOUNTER — Encounter (HOSPITAL_COMMUNITY): Payer: Self-pay | Admitting: Interventional Radiology

## 2018-08-03 ENCOUNTER — Telehealth: Payer: Self-pay | Admitting: *Deleted

## 2018-08-03 NOTE — Telephone Encounter (Signed)
Transition Care Management Follow-up Telephone Call   Date discharged? 08/02/2018   How have you been since you were released from the hospital? "Doing alright"   Do you understand why you were in the hospital? YES   Do you understand the discharge instructions? YES   Where were you discharged to? Home and lives with her daughter   Items Reviewed:  Medications reviewed: YES  Allergies reviewed: YES  Dietary changes reviewed: YES  Referrals reviewed: YES, seeing eye doctor next week   Functional Questionnaire:   Activities of Daily Living (ADLs):   She states they are independent in the following: grooming, using the bathroom, ambulation, feeding and continence  States they require assistance with the following: showering and dressing at times   Any transportation issues/concerns?: NO   Any patient concerns? NO   Confirmed importance and date/time of follow-up visits scheduled YES  Provider Appointment booked with Dr. Sharen Hones on 08/07/18 at 3:30  Confirmed with patient if condition begins to worsen call PCP or go to the ER.  Patient was given the office number and encouraged to call back with question or concerns.  : YES

## 2018-08-07 ENCOUNTER — Ambulatory Visit: Payer: 59 | Admitting: Family Medicine

## 2018-08-07 ENCOUNTER — Telehealth: Payer: Self-pay | Admitting: Family Medicine

## 2018-08-07 DIAGNOSIS — J9611 Chronic respiratory failure with hypoxia: Secondary | ICD-10-CM | POA: Diagnosis not present

## 2018-08-07 DIAGNOSIS — L304 Erythema intertrigo: Secondary | ICD-10-CM | POA: Diagnosis not present

## 2018-08-07 DIAGNOSIS — I6522 Occlusion and stenosis of left carotid artery: Secondary | ICD-10-CM | POA: Diagnosis not present

## 2018-08-07 DIAGNOSIS — I110 Hypertensive heart disease with heart failure: Secondary | ICD-10-CM | POA: Diagnosis not present

## 2018-08-07 DIAGNOSIS — I739 Peripheral vascular disease, unspecified: Secondary | ICD-10-CM | POA: Diagnosis not present

## 2018-08-07 DIAGNOSIS — I69351 Hemiplegia and hemiparesis following cerebral infarction affecting right dominant side: Secondary | ICD-10-CM | POA: Diagnosis not present

## 2018-08-07 DIAGNOSIS — I5032 Chronic diastolic (congestive) heart failure: Secondary | ICD-10-CM | POA: Diagnosis not present

## 2018-08-07 DIAGNOSIS — E785 Hyperlipidemia, unspecified: Secondary | ICD-10-CM | POA: Diagnosis not present

## 2018-08-07 DIAGNOSIS — J449 Chronic obstructive pulmonary disease, unspecified: Secondary | ICD-10-CM | POA: Diagnosis not present

## 2018-08-07 NOTE — Telephone Encounter (Signed)
Need verbal approval for Physical Therapy  For 2 times a week for 2 weeks  1 time a week for 3 weeks  Couldn't find anything that the pt had dementia or alzheimer   but pt seem out of it. Want to know what did the hospital find out was wrong with her.

## 2018-08-08 DIAGNOSIS — I5032 Chronic diastolic (congestive) heart failure: Secondary | ICD-10-CM | POA: Diagnosis not present

## 2018-08-08 DIAGNOSIS — L304 Erythema intertrigo: Secondary | ICD-10-CM | POA: Diagnosis not present

## 2018-08-08 DIAGNOSIS — I69351 Hemiplegia and hemiparesis following cerebral infarction affecting right dominant side: Secondary | ICD-10-CM | POA: Diagnosis not present

## 2018-08-08 DIAGNOSIS — I739 Peripheral vascular disease, unspecified: Secondary | ICD-10-CM | POA: Diagnosis not present

## 2018-08-08 DIAGNOSIS — J449 Chronic obstructive pulmonary disease, unspecified: Secondary | ICD-10-CM | POA: Diagnosis not present

## 2018-08-08 DIAGNOSIS — I6522 Occlusion and stenosis of left carotid artery: Secondary | ICD-10-CM | POA: Diagnosis not present

## 2018-08-08 DIAGNOSIS — E785 Hyperlipidemia, unspecified: Secondary | ICD-10-CM | POA: Diagnosis not present

## 2018-08-08 DIAGNOSIS — I110 Hypertensive heart disease with heart failure: Secondary | ICD-10-CM | POA: Diagnosis not present

## 2018-08-08 DIAGNOSIS — J9611 Chronic respiratory failure with hypoxia: Secondary | ICD-10-CM | POA: Diagnosis not present

## 2018-08-08 NOTE — Telephone Encounter (Signed)
Spoke with [name] [name] informing her [name] [name] gives verbal orders for PT.  Also, relayed [name] [name]'s message. Verbalizes understanding.   Spoke with pt relaying [name] [name]'s message.  Pt verbalizes understanding.  States she will be in to see [name] [name] next week and has f/u with [name] [name] for MRI, she thinks.

## 2018-08-08 NOTE — Telephone Encounter (Signed)
Agree with this. Looks like she postponed yesterday's hospital f/u appt to next week. Encourage she keep that appt. Would ensure she has f/u planned with neurology or Dr Loni Beckwitheveswhar interventional radiology.

## 2018-08-09 ENCOUNTER — Telehealth: Payer: Self-pay | Admitting: *Deleted

## 2018-08-09 DIAGNOSIS — J449 Chronic obstructive pulmonary disease, unspecified: Secondary | ICD-10-CM | POA: Diagnosis not present

## 2018-08-09 DIAGNOSIS — R0902 Hypoxemia: Secondary | ICD-10-CM | POA: Diagnosis not present

## 2018-08-09 NOTE — Telephone Encounter (Signed)
Spoke with [name] [name] with [facility]. She has completed OT eval and is requesting verbal orders for 2x/wk for 3wks for OT.

## 2018-08-10 ENCOUNTER — Encounter (HOSPITAL_COMMUNITY): Payer: Self-pay | Admitting: Emergency Medicine

## 2018-08-10 ENCOUNTER — Telehealth: Payer: Self-pay

## 2018-08-10 ENCOUNTER — Other Ambulatory Visit: Payer: Self-pay

## 2018-08-10 ENCOUNTER — Emergency Department (HOSPITAL_COMMUNITY): Payer: Medicare HMO

## 2018-08-10 ENCOUNTER — Emergency Department (HOSPITAL_COMMUNITY)
Admission: EM | Admit: 2018-08-10 | Discharge: 2018-08-10 | Disposition: A | Discharge status: Home / Self Care | Payer: Medicare HMO | Attending: Emergency Medicine | Admitting: Emergency Medicine

## 2018-08-10 DIAGNOSIS — Z79899 Other long term (current) drug therapy: Secondary | ICD-10-CM | POA: Insufficient documentation

## 2018-08-10 DIAGNOSIS — Z7902 Long term (current) use of antithrombotics/antiplatelets: Secondary | ICD-10-CM | POA: Insufficient documentation

## 2018-08-10 DIAGNOSIS — R519 Headache, unspecified: Secondary | ICD-10-CM

## 2018-08-10 DIAGNOSIS — G453 Amaurosis fugax: Secondary | ICD-10-CM | POA: Diagnosis not present

## 2018-08-10 DIAGNOSIS — I110 Hypertensive heart disease with heart failure: Secondary | ICD-10-CM | POA: Diagnosis not present

## 2018-08-10 DIAGNOSIS — I5032 Chronic diastolic (congestive) heart failure: Secondary | ICD-10-CM | POA: Diagnosis not present

## 2018-08-10 DIAGNOSIS — R51 Headache: Secondary | ICD-10-CM | POA: Insufficient documentation

## 2018-08-10 DIAGNOSIS — J449 Chronic obstructive pulmonary disease, unspecified: Secondary | ICD-10-CM | POA: Insufficient documentation

## 2018-08-10 DIAGNOSIS — Z7982 Long term (current) use of aspirin: Secondary | ICD-10-CM | POA: Insufficient documentation

## 2018-08-10 DIAGNOSIS — J9611 Chronic respiratory failure with hypoxia: Secondary | ICD-10-CM | POA: Diagnosis not present

## 2018-08-10 DIAGNOSIS — R202 Paresthesia of skin: Secondary | ICD-10-CM

## 2018-08-10 DIAGNOSIS — I739 Peripheral vascular disease, unspecified: Secondary | ICD-10-CM | POA: Diagnosis not present

## 2018-08-10 DIAGNOSIS — E039 Hypothyroidism, unspecified: Secondary | ICD-10-CM | POA: Diagnosis not present

## 2018-08-10 DIAGNOSIS — Z87891 Personal history of nicotine dependence: Secondary | ICD-10-CM | POA: Diagnosis not present

## 2018-08-10 DIAGNOSIS — I69351 Hemiplegia and hemiparesis following cerebral infarction affecting right dominant side: Secondary | ICD-10-CM | POA: Diagnosis not present

## 2018-08-10 DIAGNOSIS — R200 Anesthesia of skin: Secondary | ICD-10-CM | POA: Diagnosis not present

## 2018-08-10 DIAGNOSIS — E785 Hyperlipidemia, unspecified: Secondary | ICD-10-CM | POA: Diagnosis not present

## 2018-08-10 DIAGNOSIS — H40013 Open angle with borderline findings, low risk, bilateral: Secondary | ICD-10-CM | POA: Diagnosis not present

## 2018-08-10 DIAGNOSIS — I6522 Occlusion and stenosis of left carotid artery: Secondary | ICD-10-CM | POA: Diagnosis not present

## 2018-08-10 DIAGNOSIS — H26492 Other secondary cataract, left eye: Secondary | ICD-10-CM | POA: Diagnosis not present

## 2018-08-10 DIAGNOSIS — L304 Erythema intertrigo: Secondary | ICD-10-CM | POA: Diagnosis not present

## 2018-08-10 DIAGNOSIS — Z961 Presence of intraocular lens: Secondary | ICD-10-CM | POA: Diagnosis not present

## 2018-08-10 LAB — CBC
HCT: 39.7 % (ref 36.0–46.0)
Hemoglobin: 12.3 g/dL (ref 12.0–15.0)
MCH: 29.2 pg (ref 26.0–34.0)
MCHC: 31 g/dL (ref 30.0–36.0)
MCV: 94.3 fL (ref 80.0–100.0)
Platelets: 148 10*3/uL — ABNORMAL LOW (ref 150–400)
RBC: 4.21 MIL/uL (ref 3.87–5.11)
RDW: 13.1 % (ref 11.5–15.5)
WBC: 7.5 10*3/uL (ref 4.0–10.5)
nRBC: 0 % (ref 0.0–0.2)

## 2018-08-10 LAB — DIFFERENTIAL
Abs Immature Granulocytes: 0.03 10*3/uL (ref 0.00–0.07)
Basophils Absolute: 0.1 10*3/uL (ref 0.0–0.1)
Basophils Relative: 1 %
Eosinophils Absolute: 0.2 10*3/uL (ref 0.0–0.5)
Eosinophils Relative: 3 %
Immature Granulocytes: 0 %
Lymphocytes Relative: 23 %
Lymphs Abs: 1.7 10*3/uL (ref 0.7–4.0)
Monocytes Absolute: 0.6 10*3/uL (ref 0.1–1.0)
Monocytes Relative: 9 %
Neutro Abs: 4.8 10*3/uL (ref 1.7–7.7)
Neutrophils Relative %: 64 %

## 2018-08-10 LAB — I-STAT CHEM 8, ED
BUN: 20 mg/dL (ref 8–23)
Calcium, Ion: 1.17 mmol/L (ref 1.15–1.40)
Chloride: 107 mmol/L (ref 98–111)
Creatinine, Ser: 1.1 mg/dL — ABNORMAL HIGH (ref 0.44–1.00)
Glucose, Bld: 81 mg/dL (ref 70–99)
HCT: 38 % (ref 36.0–46.0)
Hemoglobin: 12.9 g/dL (ref 12.0–15.0)
Potassium: 5 mmol/L (ref 3.5–5.1)
Sodium: 140 mmol/L (ref 135–145)
TCO2: 25 mmol/L (ref 22–32)

## 2018-08-10 LAB — COMPREHENSIVE METABOLIC PANEL
ALT: 16 U/L (ref 0–44)
AST: 26 U/L (ref 15–41)
Albumin: 3.6 g/dL (ref 3.5–5.0)
Alkaline Phosphatase: 96 U/L (ref 38–126)
Anion gap: 8 (ref 5–15)
BUN: 16 mg/dL (ref 8–23)
CO2: 25 mmol/L (ref 22–32)
Calcium: 9 mg/dL (ref 8.9–10.3)
Chloride: 106 mmol/L (ref 98–111)
Creatinine, Ser: 1.06 mg/dL — ABNORMAL HIGH (ref 0.44–1.00)
GFR calc Af Amer: 60 mL/min — ABNORMAL LOW (ref >60)
GFR calc non Af Amer: 52 mL/min — ABNORMAL LOW (ref >60)
Glucose, Bld: 82 mg/dL (ref 70–99)
Potassium: 5 mmol/L (ref 3.5–5.1)
Sodium: 139 mmol/L (ref 135–145)
Total Bilirubin: 0.6 mg/dL (ref 0.3–1.2)
Total Protein: 6.8 g/dL (ref 6.5–8.1)

## 2018-08-10 LAB — I-STAT TROPONIN, ED: Troponin i, poc: 0 ng/mL (ref 0.00–0.08)

## 2018-08-10 LAB — APTT: aPTT: 32 seconds (ref 24–36)

## 2018-08-10 LAB — PROTIME-INR
INR: 1
Prothrombin Time: 13.1 seconds (ref 11.4–15.2)

## 2018-08-10 LAB — ETHANOL: Alcohol, Ethyl (B): <10 mg/dL (ref <10)

## 2018-08-10 MED ORDER — TRAMADOL-ACETAMINOPHEN 37.5-325 MG PO TABS
1.0000 | ORAL_TABLET | Freq: Once | ORAL | Status: AC
  Administered 2018-08-10: 1 via ORAL
  Filled 2018-08-10: qty 1

## 2018-08-10 NOTE — ED Provider Notes (Signed)
[facility] EMERGENCY DEPARTMENT Provider Note   CSN: [id number] Arrival date & time: 08/10/18  1716     History   Chief Complaint Chief Complaint  Patient presents with  . Facial numbness    "for several days"  . right sided weakness    "for about a week"  . Visual Field Change    right eye, "sometime earlier today"    HPI [name] [name] is a 72 y.o. female.  72 year old female brought in by family from home.  Per family patient was recently noted to the hospital for stroke versus TIA, discharged home and followed up today with ophthalmology as well as in-home PT and OT. PT/OT was concerned due to elevate BP today with room air O2 sat in the 60s, not normally on O2 during the day. Patient reports frontal pressure type headache today with pain in her right and left jaw, denies chest pain or SHOB. Also family noticed left side of face drooping more than previous, onset around 1PM today. Patient complains of right side face/arm/neck altered sensation compared to left (daughter states previously had left side numbness). Daughter also reports right arm/leg increase in tremor, no changes in speech or gait.     Past Medical History:  Diagnosis Date  . Aneurysm (HCC)   . Anxiety   . Anxiety and depression   . Carotid stenosis    R 50% (12/2012)  . Concussion 08/03/2015  . COPD (chronic obstructive pulmonary disease) (HCC) 12/2012   spirometry: Pre: FVC 84%, FEV1 69%, ratio 0.64 consistent with moderate obstruction.  . Depression   . Fall 08/03/2015   d/c home health 08/2015  . Fracture of cervical vertebra, C5 (HCC) 08/06/2015  . History of chicken pox   . Hyperlipidemia   . Hypertension   . Lower back pain    h/o HNP s/p surgery  . Osteoarthritis    h/o ruptured disc s/p ESI  . Osteoporosis 11/2010   DEXA -2.7 spine, thoracic compression fracture  . Peripheral vascular disease (HCC)   . Smoker    quit 10/2012  . Stroke [facility]) 2010   x3 with residual R  hemiparesis, s/p R MCA balloon angioplasty (2010)    Patient Active Problem List   Diagnosis Date Noted  . Vision loss of left eye 07/31/2018  . Acute metabolic encephalopathy 07/31/2018  . Thrombocytopenia (HCC) 07/31/2018  . Stroke-like symptoms 07/31/2018  . Numbness   . Tremor 07/03/2018  . Weakness 05/07/2018  . Chronic diastolic heart failure (HCC) 01/30/2018  . Hyperkalemia 01/30/2018  . Memory loss 01/30/2018  . Peripheral edema 01/30/2018  . Intertrigo 01/30/2018  . Chronic back pain 11/21/2017  . Chronic respiratory failure with hypoxia (HCC) 11/14/2017  . SOB (shortness of breath) 11/14/2017  . Closed compression fracture of fourth lumbar vertebra (HCC) 10/02/2017  . Compression fracture of body of thoracic vertebra (HCC) 09/30/2017  . Hypothyroidism (acquired) 06/28/2017  . Vitamin D deficiency 06/25/2017  . Advanced care planning/counseling discussion 01/29/2015  . Encounter for general adult medical examination with abnormal findings 01/29/2015  . Carotid stenosis 04/03/2014  . Carotid artery aneurysm (HCC) 04/03/2014  . Recurrent falls 01/02/2014  . Hemiparesis affecting right side as late effect of stroke (HCC) 01/02/2014  . Insomnia 01/10/2013  . Medicare annual wellness visit, subsequent 11/26/2012  . Hypertension   . Stroke (HCC)   . Peripheral vascular disease (HCC)   . COPD (chronic obstructive pulmonary disease) (HCC)   . Hyperlipidemia   . Osteoarthritis   .  MDD (major depressive disorder), recurrent episode, moderate (HCC)   . Ex-smoker   . HTN (hypertension) 02/15/2012  . Osteoporosis 11/25/2010    Past Surgical History:  Procedure Laterality Date  . APPENDECTOMY  1960  . CATARACT EXTRACTION     bilateral  . CESAREAN SECTION    . CHOLECYSTECTOMY  1970  . COLONOSCOPY  2004   diverticulosis, no polyps Jarold Motto)  . COLONOSCOPY  05/2016   decreased sphincter tone, diverticulosis, no f/u recommended (Danis)  . DEXA  11/2010   T -2.7 spine,  -1.9 hip  . HIP SURGERY Left 2006   fractured - screws placed  . IR ANGIO INTRA EXTRACRAN SEL COM CAROTID INNOMINATE BILAT MOD SED  08/01/2018  . IR ANGIO VERTEBRAL SEL SUBCLAVIAN INNOMINATE UNI R MOD SED  08/01/2018  . IR RADIOLOGIST EVAL & MGMT  01/09/2018  . KYPHOPLASTY  10/02/2017   Procedure: LUMBAR FOUR KYPHOPLASTY;  Surgeon: Coletta Memos, MD;  Location: [facility] OR;  Service: Neurosurgery;;  . RADIOLOGY WITH ANESTHESIA N/A 11/11/2015   Procedure: RADIOLOGY WITH ANESTHESIA;  Surgeon: Julieanne Cotton, MD;  Location: [facility] OR;  Service: Radiology;  Laterality: N/A;     OB History   None      Home Medications    Prior to Admission medications   Medication Sig Start Date End Date Taking? Authorizing Provider  acetaminophen (TYLENOL) 500 MG tablet Take 500 mg by mouth 2 (two) times daily as needed for mild pain.     [provider]  albuterol (PROVENTIL HFA;VENTOLIN HFA) 108 (90 Base) MCG/ACT inhaler Inhale 2 puffs into the lungs every 6 (six) hours as needed for wheezing or shortness of breath. 11/21/17   Shane Crutch, MD  alendronate (FOSAMAX) 70 MG tablet TAKE 1 TABLET WEEKLY  WITH A FULL GLASS OF WATER ON AN EMPTY STOMACH Patient taking differently: Take 70 mg by mouth once a week. On Tuesday 10/11/16   [name], MD  aspirin EC 325 MG EC tablet Take 1 tablet (325 mg total) by mouth daily. 08/03/18   Joseph Art, DO  atorvastatin (LIPITOR) 40 MG tablet TAKE 1 TABLET (40 MG TOTAL) BY MOUTH DAILY. 12/28/17   [name], MD  benazepril (LOTENSIN) 5 MG tablet Take 1 tablet (5 mg total) by mouth daily. 11/20/17   [name], MD  Calcium Carb-Cholecalciferol (CALCIUM-VITAMIN D) 600-400 MG-UNIT TABS Take 1 tablet by mouth daily.    [provider]  Cholecalciferol (VITAMIN D PO) Take 1 capsule by mouth daily.    [provider]  clopidogrel (PLAVIX) 75 MG tablet TAKE 1 TABLET EVERY DAY Patient taking differently: Take 75 mg by mouth daily.   05/24/18   [name], MD  clotrimazole-betamethasone (LOTRISONE) cream Apply 1 application topically 2 (two) times daily. 07/02/18   [name], MD  fexofenadine (ALLEGRA) 180 MG tablet Take 180 mg by mouth daily as needed for allergies.     [provider]  fluconazole (DIFLUCAN) 150 MG tablet Take 1 tablet (150 mg total) by mouth once a week. 06/24/18   [name], MD  fluticasone-salmeterol (ADVAIR HFA) [phone] MCG/ACT inhaler Inhale 2 puffs into the lungs 2 (two) times daily. Rinse mouth after use. 11/21/17   Shane Crutch, MD  furosemide (LASIX) 20 MG tablet Take 1 tablet (20 mg total) by mouth as needed for fluid or edema. May take once or twice a week for swelling 05/07/18   [name], MD  levothyroxine (SYNTHROID, LEVOTHROID) 50 MCG tablet Take 1 tablet (50 mcg  total) by mouth daily before breakfast. 05/19/18   Eustaquio BoydenGutierrez, Javier, MD  nystatin (MYCOSTATIN/NYSTOP) powder Apply topically 3 (three) times daily. 06/24/18   Eustaquio BoydenGutierrez, Javier, MD  Potassium Chloride ER 20 MEQ TBCR Take 20 mEq by mouth as needed (whenever you take lasix). 07/02/18   Eustaquio BoydenGutierrez, Javier, MD  sertraline (ZOLOFT) 100 MG tablet Take 1 tablet (100 mg total) by mouth daily. 07/02/18   Eustaquio BoydenGutierrez, Javier, MD  traMADol (ULTRAM) 50 MG tablet Take 1-2 tablets (50-100 mg total) by mouth 3 (three) times daily as needed for moderate pain. 05/07/18   Eustaquio BoydenGutierrez, Javier, MD  traZODone (DESYREL) 100 MG tablet Take 1 tablet (100 mg total) by mouth at bedtime. 07/02/18   Eustaquio BoydenGutierrez, Javier, MD    Family History Family History  Problem Relation Age of Onset  . CAD Mother        MI  . Cancer Mother        cervical  . CAD Maternal Aunt        MI  . CAD Sister        MI  . Sudden death Father [id number]       died in his sleep, chain smoker  . Cirrhosis Father   . Hypertension Daughter   . Diabetes Maternal Aunt   . Cancer Maternal Grandmother        cervical    Social History Social History    Tobacco Use  . Smoking status: Former Smoker    Packs/day: 2.00    Years: 46.00    Pack years: 92.00    Types: Cigarettes    Last attempt to quit: 11/08/2012    Years since quitting: 5.7  . Smokeless tobacco: Never Used  Substance Use Topics  . Alcohol use: No    Alcohol/week: 0.0 standard drinks  . Drug use: No     Allergies   Doxycycline   Review of Systems Review of Systems  Unable to perform ROS: Other (poor historian )  Constitutional: Negative for fever.  Respiratory: Negative for shortness of breath.   Cardiovascular: Negative for chest pain.  Gastrointestinal: Negative for abdominal pain, nausea and vomiting.  Genitourinary: Negative for dysuria and frequency.  Musculoskeletal: Negative for back pain.  Skin: Negative for rash and wound.  Allergic/Immunologic: Negative for immunocompromised state.  Neurological: Positive for weakness, numbness and headaches.  Psychiatric/Behavioral: Negative for confusion.     Physical Exam Updated Vital Signs BP (!) 184/67   Pulse 76   Temp 98.6 F (37 C)   Resp 16   Ht 5' 1.5" (1.562 m)   Wt 70.7 kg   SpO2 100%   BMI 28.97 kg/m   Physical Exam  Constitutional: She is oriented to person, place, and time. She appears well-developed and well-nourished. No distress.  HENT:  Head: Normocephalic and atraumatic.  Mouth/Throat: Oropharynx is clear and moist. No oropharyngeal exudate.  Eyes: Pupils are equal, round, and reactive to light. Conjunctivae and EOM are normal.  Neck: Neck supple.  Cardiovascular: Normal rate, regular rhythm, normal heart sounds and intact distal pulses.  No murmur heard. Pulmonary/Chest: Effort normal and breath sounds normal. No respiratory distress.  Abdominal: Soft. There is no tenderness.  Musculoskeletal: She exhibits no tenderness or deformity.  Neurological: She is alert and oriented to person, place, and time. She has normal strength. She displays tremor. A cranial nerve deficit and  sensory deficit is present. Coordination abnormal. GCS eye subscore is 4. GCS verbal subscore is 4. GCS motor subscore is 6.  Tremor right arm and leg. No pronator drift. Equal arm and leg strength push/pull. Left leg hit bed before count of 5. Finger to nose on right- patient touches lips instead of nose. Reports decrease in sensation on right side face, arm, leg. Left side mouth droop, tongue midline.   Skin: Skin is warm and dry. She is not diaphoretic.  Psychiatric: She has a normal mood and affect. Her behavior is normal.  Nursing note and vitals reviewed.    ED Treatments / Results  Labs (all labs ordered are listed, but only abnormal results are displayed) Labs Reviewed  CBC - Abnormal; Notable for the following components:      Result Value   Platelets 148 (*)    All other components within normal limits  COMPREHENSIVE METABOLIC PANEL - Abnormal; Notable for the following components:   Creatinine, Ser 1.06 (*)    GFR calc non Af Amer 52 (*)    GFR calc Af Amer 60 (*)    All other components within normal limits  I-STAT CHEM 8, ED - Abnormal; Notable for the following components:   Creatinine, Ser 1.10 (*)    All other components within normal limits  ETHANOL  PROTIME-INR  APTT  DIFFERENTIAL  I-STAT TROPONIN, ED    EKG EKG Interpretation  Date/Time:  Friday August 10 2018 17:35:19 EST Ventricular Rate:  68 PR Interval:    QRS Duration: 94 QT Interval:  404 QTC Calculation: 430 R Axis:   10 Text Interpretation:  Sinus rhythm Low voltage, precordial leads Abnormal R-wave progression, early transition No significant change since last tracing Confirmed by Vanetta Mulders [phone]) on 08/10/2018 6:10:26 PM   Radiology Mr Brain Wo Contrast  Result Date: 08/10/2018 CLINICAL DATA:  Initial evaluation for right facial numbness and tingling. EXAM: MRI HEAD WITHOUT CONTRAST TECHNIQUE: Multiplanar, multiecho pulse sequences of the brain and surrounding structures were  obtained without intravenous contrast. COMPARISON:  Comparison made with recent MRIs from 08/02/2018 and 07/31/2018 FINDINGS: Brain: Severe cerebral atrophy again noted, most prevalent within the frontal and parietal lobes. Chronic microvascular ischemic changes involving the periventricular and deep white matter both cerebral hemispheres is unchanged. Small remote lacunar infarct noted within the right thalamus. Few tiny remote bilateral cerebellar infarcts noted. No abnormal foci of restricted diffusion to suggest acute or subacute ischemia. Gray-white matter differentiation maintained. No evidence for acute intracranial hemorrhage. Few scattered chronic micro hemorrhages noted, stable. No mass lesion, midline shift or mass effect. Diffuse ventricular prominence related to global parenchymal volume loss of hydrocephalus, stable. No extra-axial fluid collection. Pituitary gland within normal limits. Vascular: Major intracranial vascular flow voids are maintained. Skull and upper cervical spine: Craniocervical junction within normal limits. Bone marrow signal intensity normal. Scalp soft tissues within normal limits. Sinuses/Orbits: Patient status post bilateral ocular lens replacement. Paranasal sinuses are clear. Small chronic appearing bilateral mastoid effusions noted, of doubtful significance. Inner ear structures grossly normal. Other: None. IMPRESSION: 1. No acute intracranial infarct or other abnormality identified. 2. Severe cerebral atrophy with underlying chronic microvascular ischemic disease, with small remote lacunar infarcts involving the right thalamus and cerebellum, stable. Electronically Signed   By: Rise Mu M.D.   On: 08/10/2018 20:36    Procedures Procedures (including critical care time)  Medications Ordered in ED Medications  traMADol-acetaminophen (ULTRACET) 37.5-325 MG per tablet 1 tablet (has no administration in time range)     Initial Impression / Assessment and  Plan / ED Course  I have reviewed the triage vital signs  and the nursing notes.  Pertinent labs & imaging results that were available during my care of the patient were reviewed by me and considered in my medical decision making (see chart for details).  Clinical Course as of Aug 10 2110  Fri Aug 10, 2018  1911 71yo female brought in by daughter for concern for left side face increase in droop as on 1PM today with complaint of frontal pressure type headache, numbness right side face/arm/leg. Also advised to come to the ER by PCP office due to reports on home PT/OT O2 reading in the 60s with elevated blood pressure. History provided by patient's daughter, states she was recently admitted to the hospital, unclear if CVA or TIA. I have reviewed recent admission, discussed with Dr. Deretha Emory, ER attending, consult to neurohospitalist Dr. Rozelle Logan who recommends MRI brain without contrast.    [LM]  2056 MRI brain without change from previous. Case discussed with Dr. Amada Jupiter, neuro hospitalist, has reviewed recent admission (extensive workup including formal angiogram, no further benefit in admission), recommends follow up with outpatient neuro Dr. Pearlean Brownie- call office on Monday with update.    [LM]  2110 Discussed results and plan of care with patient and family, patient requests ultram for her headache, is prescribed this for pain. Given Ultracet for her headache prior to discharge.    [LM]    Clinical Course User Index [LM] Jeannie Fend, PA-C   Final Clinical Impressions(s) / ED Diagnoses   Final diagnoses:  Nonintractable headache, unspecified chronicity pattern, unspecified headache type  Paresthesia    ED Discharge Orders    None       Alden Hipp 08/10/18 2111    Vanetta Mulders, MD 08/14/18 [phone]

## 2018-08-10 NOTE — Discharge Instructions (Addendum)
Call Dr. Marlis EdelsonSethi's office Monday to update and follow up. Return to the ER for any new or worsening symptoms.

## 2018-08-10 NOTE — ED Triage Notes (Signed)
Pt not clear with details, pt reports 1 week of right sided weakness, right sided facial numbness for several days, and right sided eye blurriness since earlier today, pt cannot give me a specific time. Did not wake up with the eye blurriness.

## 2018-08-10 NOTE — ED Triage Notes (Signed)
Mouth numbness today

## 2018-08-10 NOTE — Telephone Encounter (Signed)
Sp[facility] with [facility] informing her Dr. Reece AgarG is giving verbal orders for OT.  Verbalizes understanding.

## 2018-08-10 NOTE — ED Triage Notes (Signed)
BIB daughter to ED with multiple vague complaints, one of which is increased numbness on right side of face fro this am, hx of strokes and tia's - was here last week for similar symptoms.

## 2018-08-10 NOTE — Telephone Encounter (Signed)
Patients daughter called to report that during patient's PT session just now her BP spiked and oxygen dropped to 64% and she began having numbness and tingling to right side of face.  Therapist had them call in immediately.  Patient put back on her oxygen and turned up to 4L but still difficulty maintaining 02 status between 84 and 89% with progressing symptoms.   Given symptoms and ongoing desat with numbness/tingling, daughter instructed to hang up and IMMEDIATELY call 911 to have mother transported to ER for eval and CVA workup.    Daughter verbalizes understanding and will call right away.  FYI to Dr. Reece AgarG.

## 2018-08-10 NOTE — Telephone Encounter (Signed)
Agree with this. Thank you.  

## 2018-08-13 NOTE — Telephone Encounter (Signed)
Noted. Thanks.

## 2018-08-14 DIAGNOSIS — I6522 Occlusion and stenosis of left carotid artery: Secondary | ICD-10-CM | POA: Diagnosis not present

## 2018-08-14 DIAGNOSIS — E785 Hyperlipidemia, unspecified: Secondary | ICD-10-CM | POA: Diagnosis not present

## 2018-08-14 DIAGNOSIS — I739 Peripheral vascular disease, unspecified: Secondary | ICD-10-CM | POA: Diagnosis not present

## 2018-08-14 DIAGNOSIS — I5032 Chronic diastolic (congestive) heart failure: Secondary | ICD-10-CM | POA: Diagnosis not present

## 2018-08-14 DIAGNOSIS — J449 Chronic obstructive pulmonary disease, unspecified: Secondary | ICD-10-CM | POA: Diagnosis not present

## 2018-08-14 DIAGNOSIS — L304 Erythema intertrigo: Secondary | ICD-10-CM | POA: Diagnosis not present

## 2018-08-14 DIAGNOSIS — J9611 Chronic respiratory failure with hypoxia: Secondary | ICD-10-CM | POA: Diagnosis not present

## 2018-08-14 DIAGNOSIS — I69351 Hemiplegia and hemiparesis following cerebral infarction affecting right dominant side: Secondary | ICD-10-CM | POA: Diagnosis not present

## 2018-08-14 DIAGNOSIS — I110 Hypertensive heart disease with heart failure: Secondary | ICD-10-CM | POA: Diagnosis not present

## 2018-08-15 ENCOUNTER — Encounter: Payer: Self-pay | Admitting: Family Medicine

## 2018-08-15 ENCOUNTER — Ambulatory Visit (INDEPENDENT_AMBULATORY_CARE_PROVIDER_SITE_OTHER): Payer: Medicare HMO | Admitting: Family Medicine

## 2018-08-15 VITALS — BP 124/84 | HR 92 | Temp 98.2°F | Ht 61.5 in | Wt 154.5 lb

## 2018-08-15 DIAGNOSIS — I10 Essential (primary) hypertension: Secondary | ICD-10-CM

## 2018-08-15 DIAGNOSIS — E785 Hyperlipidemia, unspecified: Secondary | ICD-10-CM | POA: Diagnosis not present

## 2018-08-15 DIAGNOSIS — Z7189 Other specified counseling: Secondary | ICD-10-CM | POA: Diagnosis not present

## 2018-08-15 DIAGNOSIS — M810 Age-related osteoporosis without current pathological fracture: Secondary | ICD-10-CM

## 2018-08-15 DIAGNOSIS — I5032 Chronic diastolic (congestive) heart failure: Secondary | ICD-10-CM

## 2018-08-15 DIAGNOSIS — R2990 Unspecified symptoms and signs involving the nervous system: Secondary | ICD-10-CM | POA: Diagnosis not present

## 2018-08-15 DIAGNOSIS — I739 Peripheral vascular disease, unspecified: Secondary | ICD-10-CM

## 2018-08-15 NOTE — Assessment & Plan Note (Addendum)
Will stop supplemental calcium in h/o recurrent strokes and known cerebral atherosclerosis.  Continue vit D, fosamax.

## 2018-08-15 NOTE — Assessment & Plan Note (Signed)
Discussed new goal systolic BP 130-150.  Only on benazepril 5mg  daily.  They forgot BP log at home but pt states overall averaging 120-130s.

## 2018-08-15 NOTE — Assessment & Plan Note (Addendum)
Brings advanced directive which was reviewed and scanned today (07/2018). [name] Theola SequinSue Patterson then Jonette Pesaonya Derenzo are HCPOA. Does not want life prolonged if deemed terminal condition. Does want tube feeding however. Wants living will followed above HCPOA. Discussed code status - she would want trail at CPR, pressors, intubation, aware of risks associated with these conditions including but not limited to rib fractures, difficulty weaning off ventilator.

## 2018-08-15 NOTE — ACP (Advance Care Planning) (Signed)
Brings advanced directive which was reviewed and scanned (07/2018). [name] [name] then [name] [name] daughters are HCPOA. Does not want life prolonged if deemed terminal condition. Does want tube feeding however. Wants living will followed above HCPOA. Discussed code status - she would want trail at CPR, pressors, intubation, aware of risks associated with these conditions including but not limited to rib fractures, difficulty weaning off ventilator.

## 2018-08-15 NOTE — Patient Instructions (Addendum)
Stop calcium pill. Continue plain vitamin D and fosamax and good intake of foods high in dietary calcium.  Continue current medicines. Good to see you today.  Continue monitoring blood pressures and weight. Let me know if consistently too high or too low. Continue benazepril 5mg  daily for now.

## 2018-08-15 NOTE — Assessment & Plan Note (Signed)
Chronic, stable. Continue statin.  Lab Results  Component Value Date   LDLCALC 103 (H) 07/31/2018

## 2018-08-15 NOTE — Assessment & Plan Note (Signed)
She feels she's having ongoing mini strokes although hospital and ER evaluation were reassuring. She does have high degree of intracranial blockages on recent cerebral arteriogram. Discussed reasoning behind higher acceptable BP range and higher aspirin dose. She is having some nosebleeds on this - recommended vaseline, nasal saline spray.

## 2018-08-15 NOTE — Progress Notes (Signed)
BP 124/84 (BP Location: Left Arm, Patient Position: Sitting, Cuff Size: Normal)   Pulse 92   Temp 98.2 F (36.8 C) (Oral)   Ht 5' 1.5" (1.562 m)   Wt 154 lb 8 oz (70.1 kg)   SpO2 95%   BMI 28.72 kg/m    CC: hosp f/u visit Subjective:    Patient ID: [name] [name], female    DOB: May 21, 1946, 72 y.o.   MRN: [id number]  HPI: [name] [name] is a 72 y.o. female presenting on 08/15/2018 for Hospitalization Follow-up (Seen at [facility] ED on 08/10/18.  Pt is accompanied by her daughter, Alexia Freestone. ) and Epistaxis (C/o nosebleed that started this morning. )   Recent hospitalization for stroke like symptoms earlier this month - presented with L eye vision loss, left facial droop, and left facial numbness. Workup included normal MRI and MRA showed L ICA siphon and R M1 chronic severe stenosis (followed by interventional radiology). She had cerebral angiogram performed (Deveshwar). Planning to continue aspirin and plavix regularly. Recommended BP goal 130-150 systolic. Aspirin was increased to 325mg , plavix continued daily. She saw eye doctor with good report - no signs of stroke in eye exam. She states she feels she continues having mini strokes.   PT/OT HH involved.  States home BP ranging 130 systolic. She is taking benazepril 5mg  daily for blood pressure control. She takes lasix about once a week.   Was evaluated again last week 08/10/2018 for elevated blood pressures and worsening hypoxia. She also had headache and bilateral jaw pain. Repeat MRI was reassuring - discharged from ER with planned outpatient f/u.  IMPRESSION: 1. No acute intracranial infarct or other abnormality identified. 2. Severe cerebral atrophy with underlying chronic microvascular ischemic disease, with small remote lacunar infarcts involving the right thalamus and cerebellum, stable Electronically Signed By: Rise Mu M.D. On: 08/10/2018 20:36  Brings advanced directive which was reviewed and scanned today  (07/2018). [name] Theola Sequin then Jonette Pesa are HCPOA. Does not want life prolonged if deemed terminal condition. Does want tube feeding however. Wants living will followed above HCPOA. Discussed code status - she would want trail at CPR, pressors, intubation, aware of risks associated with these conditions including but not limited to rib fractures, difficulty weaning off ventilator.   Admit date: 07/30/2018 Discharge date: 08/02/2018 TCM hosp f/u phone call performed 08/03/2018 Admitted From: home Discharge disposition: home with daughter  Recommendations for Outpatient Follow-Up:  Eye dr appointment made Long term BP goal 130-150 given severe intracranial stenosis-- may need to stop BP medications  Discharge diagnosis: Principal Problem:   Stroke-like symptoms Active Problems:   Hyperlipidemia   MDD (major depressive disorder), recurrent episode, moderate (HCC)   Hypothyroidism (acquired)   HTN (hypertension)   Chronic diastolic heart failure (HCC)   Vision loss of left eye   Acute metabolic encephalopathy   Thrombocytopenia (HCC)  Discharge Condition: Improved. Diet recommendation: Low sodium, heart healthy. Wound care: None. Code status: Full.  Relevant past medical, surgical, family and social history reviewed and updated as indicated. Interim medical history since our last visit reviewed. Allergies and medications reviewed and updated. Discharge meds reconciled with current home medication list  Outpatient Medications Prior to Visit  Medication Sig Dispense Refill  . acetaminophen (TYLENOL) 500 MG tablet Take 500 mg by mouth 2 (two) times daily as needed for mild pain.     [name] albuterol (PROVENTIL HFA;VENTOLIN HFA) 108 (90 Base) MCG/ACT inhaler Inhale 2 puffs into the lungs every 6 (six) hours  as needed for wheezing or shortness of breath. 1 Inhaler 2  . alendronate (FOSAMAX) 70 MG tablet TAKE 1 TABLET WEEKLY  WITH A FULL GLASS OF WATER ON AN EMPTY STOMACH (Patient  taking differently: Take 70 mg by mouth once a week. On Tuesday) 12 tablet 3  . aspirin EC 325 MG EC tablet Take 1 tablet (325 mg total) by mouth daily. 30 tablet 0  . atorvastatin (LIPITOR) 40 MG tablet TAKE 1 TABLET (40 MG TOTAL) BY MOUTH DAILY. 90 tablet 2  . benazepril (LOTENSIN) 5 MG tablet Take 1 tablet (5 mg total) by mouth daily. 30 tablet 3  . Cholecalciferol (VITAMIN D PO) Take 1 capsule by mouth daily.    . clopidogrel (PLAVIX) 75 MG tablet TAKE 1 TABLET EVERY DAY (Patient taking differently: Take 75 mg by mouth daily. ) 90 tablet 1  . fluconazole (DIFLUCAN) 150 MG tablet Take 1 tablet (150 mg total) by mouth once a week. 2 tablet 0  . fluticasone-salmeterol (ADVAIR HFA) 115-21 MCG/ACT inhaler Inhale 2 puffs into the lungs 2 (two) times daily. Rinse mouth after use. 1 Inhaler 12  . furosemide (LASIX) 20 MG tablet Take 1 tablet (20 mg total) by mouth as needed for fluid or edema. May take once or twice a week for swelling 40 tablet 1  . levothyroxine (SYNTHROID, LEVOTHROID) 50 MCG tablet Take 1 tablet (50 mcg total) by mouth daily before breakfast. 90 tablet 1  . nystatin (MYCOSTATIN/NYSTOP) powder Apply topically 3 (three) times daily. 30 g 0  . Potassium Chloride ER 20 MEQ TBCR Take 20 mEq by mouth as needed (whenever you take lasix).    [name]. sertraline (ZOLOFT) 100 MG tablet Take 1 tablet (100 mg total) by mouth daily. 90 tablet 3  . traMADol (ULTRAM) 50 MG tablet Take 1-2 tablets (50-100 mg total) by mouth 3 (three) times daily as needed for moderate pain. 30 tablet 0  . traZODone (DESYREL) 100 MG tablet Take 1 tablet (100 mg total) by mouth at bedtime. 90 tablet 3  . Calcium Carb-Cholecalciferol (CALCIUM-VITAMIN D) 600-400 MG-UNIT TABS Take 1 tablet by mouth daily.    . clotrimazole-betamethasone (LOTRISONE) cream Apply 1 application topically 2 (two) times daily. 30 g 0  . fexofenadine (ALLEGRA) 180 MG tablet Take 180 mg by mouth daily as needed for allergies.      No  facility-administered medications prior to visit.      Per HPI unless specifically indicated in ROS section below Review of Systems     Objective:    BP 124/84 (BP Location: Left Arm, Patient Position: Sitting, Cuff Size: Normal)   Pulse 92   Temp 98.2 F (36.8 C) (Oral)   Ht 5' 1.5" (1.562 m)   Wt 154 lb 8 oz (70.1 kg)   SpO2 95%   BMI 28.72 kg/m   Wt Readings from Last 3 Encounters:  08/15/18 154 lb 8 oz (70.1 kg)  08/10/18 155 lb 13.8 oz (70.7 kg)  07/30/18 156 lb (70.8 kg)    Physical Exam  Constitutional: She appears well-developed and well-nourished. No distress.  HENT:  Head: Normocephalic and atraumatic.  Mouth/Throat: Oropharynx is clear and moist. No oropharyngeal exudate.  Cardiovascular: Normal rate, regular rhythm and normal heart sounds.  No murmur heard. Pulmonary/Chest: Effort normal and breath sounds normal. No respiratory distress. She has no wheezes. She has no rales.  Musculoskeletal: Normal range of motion. She exhibits no edema.  Neurological:  Mild head tremor present  Skin:  Large ecchymosis  R dorsal hand from blood draw  Psychiatric: She has a normal mood and affect.  Nursing note and vitals reviewed.      Assessment & Plan:   Problem List Items Addressed This Visit    Stroke-like symptoms - Primary    She feels she's having ongoing mini strokes although hospital and ER evaluation were reassuring. She does have high degree of intracranial blockages on recent cerebral arteriogram. Discussed reasoning behind higher acceptable BP range and higher aspirin dose. She is having some nosebleeds on this - recommended vaseline, nasal saline spray.       Peripheral vascular disease (HCC)   Osteoporosis    Will stop supplemental calcium in h/o recurrent strokes and known cerebral atherosclerosis.  Continue vit D, fosamax.       Hypertension    Discussed new goal systolic BP 130-150.  Only on benazepril 5mg  daily.  They forgot BP log at home but pt  states overall averaging 120-130s.       Hyperlipidemia    Chronic, stable. Continue statin.  Lab Results  Component Value Date   LDLCALC 103 (H) 07/31/2018        Chronic diastolic heart failure (HCC)   Advanced care planning/counseling discussion    Brings advanced directive which was reviewed and scanned today (07/2018). [name] Theola Sequin then Jonette Pesa are HCPOA. Does not want life prolonged if deemed terminal condition. Does want tube feeding however. Wants living will followed above HCPOA. Discussed code status - she would want trail at CPR, pressors, intubation, aware of risks associated with these conditions including but not limited to rib fractures, difficulty weaning off ventilator.           No orders of the defined types were placed in this encounter.  No orders of the defined types were placed in this encounter.   Follow up plan: Return if symptoms worsen or fail to improve.  [name], MD

## 2018-08-16 DIAGNOSIS — L304 Erythema intertrigo: Secondary | ICD-10-CM | POA: Diagnosis not present

## 2018-08-16 DIAGNOSIS — E785 Hyperlipidemia, unspecified: Secondary | ICD-10-CM | POA: Diagnosis not present

## 2018-08-16 DIAGNOSIS — I110 Hypertensive heart disease with heart failure: Secondary | ICD-10-CM | POA: Diagnosis not present

## 2018-08-16 DIAGNOSIS — J9611 Chronic respiratory failure with hypoxia: Secondary | ICD-10-CM | POA: Diagnosis not present

## 2018-08-16 DIAGNOSIS — J449 Chronic obstructive pulmonary disease, unspecified: Secondary | ICD-10-CM | POA: Diagnosis not present

## 2018-08-16 DIAGNOSIS — I69351 Hemiplegia and hemiparesis following cerebral infarction affecting right dominant side: Secondary | ICD-10-CM | POA: Diagnosis not present

## 2018-08-16 DIAGNOSIS — I739 Peripheral vascular disease, unspecified: Secondary | ICD-10-CM | POA: Diagnosis not present

## 2018-08-16 DIAGNOSIS — I5032 Chronic diastolic (congestive) heart failure: Secondary | ICD-10-CM | POA: Diagnosis not present

## 2018-08-16 DIAGNOSIS — I6522 Occlusion and stenosis of left carotid artery: Secondary | ICD-10-CM | POA: Diagnosis not present

## 2018-08-17 DIAGNOSIS — J449 Chronic obstructive pulmonary disease, unspecified: Secondary | ICD-10-CM | POA: Diagnosis not present

## 2018-08-17 DIAGNOSIS — R269 Unspecified abnormalities of gait and mobility: Secondary | ICD-10-CM | POA: Diagnosis not present

## 2018-08-20 ENCOUNTER — Telehealth: Payer: Self-pay

## 2018-08-20 ENCOUNTER — Telehealth: Payer: Self-pay | Admitting: *Deleted

## 2018-08-20 DIAGNOSIS — L304 Erythema intertrigo: Secondary | ICD-10-CM | POA: Diagnosis not present

## 2018-08-20 DIAGNOSIS — I5032 Chronic diastolic (congestive) heart failure: Secondary | ICD-10-CM | POA: Diagnosis not present

## 2018-08-20 DIAGNOSIS — J9611 Chronic respiratory failure with hypoxia: Secondary | ICD-10-CM | POA: Diagnosis not present

## 2018-08-20 DIAGNOSIS — J449 Chronic obstructive pulmonary disease, unspecified: Secondary | ICD-10-CM | POA: Diagnosis not present

## 2018-08-20 DIAGNOSIS — I69351 Hemiplegia and hemiparesis following cerebral infarction affecting right dominant side: Secondary | ICD-10-CM | POA: Diagnosis not present

## 2018-08-20 DIAGNOSIS — I6522 Occlusion and stenosis of left carotid artery: Secondary | ICD-10-CM | POA: Diagnosis not present

## 2018-08-20 DIAGNOSIS — E785 Hyperlipidemia, unspecified: Secondary | ICD-10-CM | POA: Diagnosis not present

## 2018-08-20 DIAGNOSIS — I110 Hypertensive heart disease with heart failure: Secondary | ICD-10-CM | POA: Diagnosis not present

## 2018-08-20 DIAGNOSIS — I739 Peripheral vascular disease, unspecified: Secondary | ICD-10-CM | POA: Diagnosis not present

## 2018-08-20 NOTE — Telephone Encounter (Signed)
Pt aware results of ONO. Orders placed to [facility] to increase 02 to 2 liters. Nothing further needed.

## 2018-08-20 NOTE — Telephone Encounter (Signed)
Esther OT with [facility] said pt missed OT visit on 08/17/18 due to pts daughter taking her mom out for the day. OT visits will resume this week. FYI to Dr Yolande JollyG. Esther does not need cb.

## 2018-08-20 NOTE — Telephone Encounter (Signed)
Noted  

## 2018-08-21 ENCOUNTER — Ambulatory Visit: Payer: Medicare HMO | Admitting: Neurology

## 2018-08-21 ENCOUNTER — Encounter: Payer: Self-pay | Admitting: Neurology

## 2018-08-21 VITALS — BP 177/82 | HR 75 | Ht 60.0 in | Wt 156.0 lb

## 2018-08-21 DIAGNOSIS — R2689 Other abnormalities of gait and mobility: Secondary | ICD-10-CM | POA: Diagnosis not present

## 2018-08-21 DIAGNOSIS — R9089 Other abnormal findings on diagnostic imaging of central nervous system: Secondary | ICD-10-CM

## 2018-08-21 DIAGNOSIS — Z9181 History of falling: Secondary | ICD-10-CM

## 2018-08-21 DIAGNOSIS — I679 Cerebrovascular disease, unspecified: Secondary | ICD-10-CM

## 2018-08-21 DIAGNOSIS — R296 Repeated falls: Secondary | ICD-10-CM | POA: Diagnosis not present

## 2018-08-21 DIAGNOSIS — R251 Tremor, unspecified: Secondary | ICD-10-CM

## 2018-08-21 DIAGNOSIS — Z8673 Personal history of transient ischemic attack (TIA), and cerebral infarction without residual deficits: Secondary | ICD-10-CM | POA: Diagnosis not present

## 2018-08-21 NOTE — Patient Instructions (Addendum)
You have a tremor, but no telltale signs of parkinson's.   We can consider a DaT scan: This is a specialized brain scan designed to help with diagnosis of tremor disorders. A radioactive marker gets injected and the uptake is measured in the brain and compared to normal controls and right side is compared to the left, a change in uptake can help with diagnosis of certain tremor disorders. A brain MRI on the other hand is a brain scan that helps look at the brain structure in more detail overall and look for age-related changes, blood vessel related changes and look for stroke and volume loss which we call atrophy.   Please call our office, if you decide to pursue this scan. I will see you back as needed.   I believe you have a multifactorial gait disorder, meaning, that it is [name] Roughikely is due to a combination of factors. These factors include: normal aging, obesity, degenerative arthritis of your back, taking sedating medications, such as the Trazodone, and severe vascular changes of the brain including prior strokes and atherosclerosis of the blood vessels in your brain as well as atrophy/shrinkage of the brain.  Please remember to stand up slowly and get your bearings first turn slowly, no bending down to pick anything, no heavy lifting, be extra careful at night and first thing in the morning. Also, be careful in the Bathroom and the kitchen.   Remember to drink plenty of fluid, eat healthy meals and do not skip any meals. Try to eat protein with a every meal and eat a healthy snack such as fruit or nuts or yogurt in between meals. Try to keep a regular sleep-wake schedule and try to exercise daily, particularly in the form of walking, 20-30 minutes a day, if you can. You should use a walker at all times, due to fall risk.   As far as your medications are concerned, I would like to suggest no new medications as tremor medication may cause you side effects.

## 2018-08-21 NOTE — Progress Notes (Signed)
Subjective:    Patient ID: [name] [name] is a 72 y.o. female.  HPI     Huston Foley, MD, PhD [facility] [address] [address] [place], [place] [address]  Dear Dr. Sharen Hones,  I saw your patient, [name] [name], upon your kind request in my neurologic clinic today for initial consultation of her tremor and recurrent falls. The patient is accompanied by her daughter today. As you know, [name] is a 72 year old right-handed woman with an underlying complex medical history of peripheral vascular disease, osteoporosis, depression, hypothyroidism, hypertension, hyperlipidemia, history of stroke, prior smoking, emphysema, on oxygen at night, chronic diastolic congestive heart failure, carotid artery stenosis, recurrent falls, memory loss, tremor, insomnia and mild obesity, who reports a long-standing history of hand tremors. Her daughter reports that she has had a head tremor as well, tremor duration is at least 10 years, it is a little bit worse in the right hand compared to the left. She recalls a maternal aunt with Parkinson's disease and her son has Parkinson's disease and has been on medication for 2 years, son is 75 years old. Patient lives with her other daughter and has a total of 3 living children, 2 passed away at age 82 and 48. Her tremor is worse on some days than others. She has had multiple falls. She was advised to use a cane or walker at all times but does not often do that. She did not bring a cane or walker to the appointment today.  I reviewed your office note from 07/02/2018. She is on multiple medications including potentially sedating medications including sertraline, trazodone, tramadol. She has been on trazodone for sleep for many years. She has been on sertraline for depression and takes tramadol only if needed, not daily. She tries to hydrate well with water. She quit smoking some 6 years ago, does not drink alcohol and drinks  caffeine. She has had multiple recent scans including a brain MRI without contrast on 08/10/2018 which I reviewed: IMPRESSION: 1. No acute intracranial infarct or other abnormality identified. 2. Severe cerebral atrophy with underlying chronic microvascular ischemic disease, with small remote lacunar infarcts involving the right thalamus and cerebellum, stable.  She had a recent hospitalization from 07/30/2018 through 08/02/2018 with extensive vascular workup. She presented with left amaurosis fugax and facial numbness. MRI was negative for an acute infarct, MRA head showed left ICA siphon and right M1 chronic severe intracranial stenosis. Echocardiogram was negative for blood clot and EF was 60-65%, LDL borderline at 103, A1c was less than 6. She was discharged on dual antiplatelet therapy with adult size aspirin and Plavix daily. She has a known history of severe intracranial stenosis. She has been followed by interventional radiology for this for years. CT head code stroke from 07/30/2018 showed:  IMPRESSION: 1. No acute hemorrhage. 2. Advanced atrophy and chronic microvascular ischemia. 3. ASPECTS is 10.   Her Past Medical History Is Significant For: Past Medical History:  Diagnosis Date  . Aneurysm (HCC)   . Anxiety   . Anxiety and depression   . Carotid stenosis    R 50% (12/2012)  . Concussion 08/03/2015  . COPD (chronic obstructive pulmonary disease) (HCC) 12/2012   spirometry: Pre: FVC 84%, FEV1 69%, ratio 0.64 consistent with moderate obstruction.  . Depression   . Fall 08/03/2015   d/c home health 08/2015  . Fracture of cervical vertebra, C5 (HCC) 08/06/2015  . History of chicken pox   . Hyperlipidemia   .  Hypertension   . Lower back pain    h/o HNP s/p surgery  . Osteoarthritis    h/o ruptured disc s/p ESI  . Osteoporosis 11/2010   DEXA -2.7 spine, thoracic compression fracture  . Peripheral vascular disease (HCC)   . Smoker    quit 10/2012  . Stroke [facility]) 2010   x3 with  residual R hemiparesis, s/p R MCA balloon angioplasty (2010)    Her Past Surgical History Is Significant For: Past Surgical History:  Procedure Laterality Date  . APPENDECTOMY  1960  . CATARACT EXTRACTION     bilateral  . CESAREAN SECTION    . CHOLECYSTECTOMY  1970  . COLONOSCOPY  2004   diverticulosis, no polyps Jarold Motto)  . COLONOSCOPY  05/2016   decreased sphincter tone, diverticulosis, no f/u recommended (Danis)  . DEXA  11/2010   T -2.7 spine, -1.9 hip  . HIP SURGERY Left 2006   fractured - screws placed  . IR ANGIO INTRA EXTRACRAN SEL COM CAROTID INNOMINATE BILAT MOD SED  08/01/2018  . IR ANGIO VERTEBRAL SEL SUBCLAVIAN INNOMINATE UNI R MOD SED  08/01/2018  . IR RADIOLOGIST EVAL & MGMT  01/09/2018  . KYPHOPLASTY  10/02/2017   Procedure: LUMBAR FOUR KYPHOPLASTY;  Surgeon: Coletta Memos, MD;  Location: [facility] OR;  Service: Neurosurgery;;  . RADIOLOGY WITH ANESTHESIA N/A 11/11/2015   Procedure: RADIOLOGY WITH ANESTHESIA;  Surgeon: Julieanne Cotton, MD;  Location: [facility] OR;  Service: Radiology;  Laterality: N/A;    Her Family History Is Significant For: Family History  Problem Relation Age of Onset  . CAD Mother        MI  . Cancer Mother        cervical  . CAD Maternal Aunt        MI  . CAD Sister        MI  . Sudden death Father 44       died in his sleep, chain smoker  . Cirrhosis Father   . Hypertension Daughter   . Diabetes Maternal Aunt   . Cancer Maternal Grandmother        cervical    Her Social History Is Significant For: Social History   Socioeconomic History  . Marital status: Divorced    Spouse name: Not on file  . Number of children: Not on file  . Years of education: Not on file  . Highest education level: Not on file  Occupational History  . Not on file  Social Needs  . Financial resource strain: Not on file  . Food insecurity:    Worry: Not on file    Inability: Not on file  . Transportation needs:    Medical: Not on file    Non-medical: Not on  file  Tobacco Use  . Smoking status: Former Smoker    Packs/day: 2.00    Years: 46.00    Pack years: 92.00    Types: Cigarettes    Last attempt to quit: 11/08/2012    Years since quitting: 5.7  . Smokeless tobacco: Never Used  Substance and Sexual Activity  . Alcohol use: No    Alcohol/week: 0.0 standard drinks  . Drug use: No  . Sexual activity: Not Currently  Lifestyle  . Physical activity:    Days per week: Not on file    Minutes per session: Not on file  . Stress: Not on file  Relationships  . Social connections:    Talks on phone: Not on file  Gets together: Not on file    Attends religious service: Not on file    Active member of club or organization: Not on file    Attends meetings of clubs or organizations: Not on file    Relationship status: Not on file  Other Topics Concern  . Not on file  Social History Narrative   Lives with granddaughter and great grandchildren   Occupation: retired, was CNA   Edu: 8th grade   Activity: no regular activity:   Diet: some water, fruits/vegetables daily    Her Allergies Are:  Allergies  Allergen Reactions  . Doxycycline Other (See Comments)    Sig GI upset  :   Her Current Medications Are:  Outpatient Encounter Medications as of 08/21/2018  Medication Sig  . acetaminophen (TYLENOL) 500 MG tablet Take 500 mg by mouth 2 (two) times daily as needed for mild pain.   [name] albuterol (PROVENTIL HFA;VENTOLIN HFA) 108 (90 Base) MCG/ACT inhaler Inhale 2 puffs into the lungs every 6 (six) hours as needed for wheezing or shortness of breath.  [name] aspirin EC 325 MG EC tablet Take 1 tablet (325 mg total) by mouth daily.  [name] atorvastatin (LIPITOR) 40 MG tablet TAKE 1 TABLET (40 MG TOTAL) BY MOUTH DAILY.  . benazepril (LOTENSIN) 5 MG tablet Take 1 tablet (5 mg total) by mouth daily.  . Cholecalciferol (VITAMIN D PO) Take 1 capsule by mouth daily.  . clopidogrel (PLAVIX) 75 MG tablet TAKE 1 TABLET EVERY DAY (Patient taking differently: Take  75 mg by mouth daily. )  . fluconazole (DIFLUCAN) 150 MG tablet Take 1 tablet (150 mg total) by mouth once a week.  . fluticasone-salmeterol (ADVAIR HFA) 115-21 MCG/ACT inhaler Inhale 2 puffs into the lungs 2 (two) times daily. Rinse mouth after use.  . furosemide (LASIX) 20 MG tablet Take 1 tablet (20 mg total) by mouth as needed for fluid or edema. May take once or twice a week for swelling  . levothyroxine (SYNTHROID, LEVOTHROID) 50 MCG tablet Take 1 tablet (50 mcg total) by mouth daily before breakfast.  . nystatin (MYCOSTATIN/NYSTOP) powder Apply topically 3 (three) times daily.  . Potassium Chloride ER 20 MEQ TBCR Take 20 mEq by mouth as needed (whenever you take lasix).  [name] sertraline (ZOLOFT) 100 MG tablet Take 1 tablet (100 mg total) by mouth daily.  . traMADol (ULTRAM) 50 MG tablet Take 1-2 tablets (50-100 mg total) by mouth 3 (three) times daily as needed for moderate pain.  . traZODone (DESYREL) 100 MG tablet Take 1 tablet (100 mg total) by mouth at bedtime.  . [DISCONTINUED] alendronate (FOSAMAX) 70 MG tablet TAKE 1 TABLET WEEKLY  WITH A FULL GLASS OF WATER ON AN EMPTY STOMACH (Patient taking differently: Take 70 mg by mouth once a week. On Tuesday)   No facility-administered encounter medications on file as of 08/21/2018.   :   Review of Systems:  Out of a complete 14 point review of systems, all are reviewed and negative with the exception of these symptoms as listed below:  Review of Systems  Neurological:       Pt presents today to discuss her falls and tremor. Pt notices a tremor in both hands but she is right handed. Pt has had multiple falls. Pt reports fluctuating BP.    Objective:  Neurological Exam  Physical Exam Physical Examination:   Vitals:   08/21/18 1429  BP: (!) 177/82  Pulse: 75   General Examination: The patient is a very pleasant  72 y.o. female in no acute distress. She appears frail, well groomed.   HEENT: Normocephalic, atraumatic, pupils are  equal, round and reactive to light and accommodation. She has mild difficulty tracking, slight facial masking is noted, slight decrease in eye blink rate. Hearing is grossly intact, airway examination reveals mild to moderate mouth dryness, no abnormal involuntary tongue or mouth movements, she has no lip or lower jaw tremor. She has no head tremor. Neck is supple with full range of motion. Tongue protrudes centrally and palate elevates symmetrically. She may have mild hypophonia, no voice tremor.  Chest: Clear to auscultation without wheezing, rhonchi or crackles noted.  Heart: S1+S2+0, regular and normal without murmurs, rubs or gallops noted.   Abdomen: Soft, non-tender and non-distended with normal bowel sounds appreciated on auscultation.  Extremities: There is trace pitting edema in the distal lower extremities bilaterally.  Skin: Warm and dry without trophic changes noted. Multiple bruises noted across forearms bilaterally. Of note, she is on adult size aspirin and Plavix.  Musculoskeletal: exam reveals no obvious joint deformities, tenderness or joint swelling or erythema.   Neurologically:  Mental status: The patient is awake, alert and oriented in all 4 spheres. There is some slowness in thinking and is noticeable in her responses. Short-term memory is mildly impaired. She has no dysarthria. Mood is normal, affect is normal. Cranial nerves II - XII are as described above under HEENT exam. In addition: shoulder shrug is normal with equal shoulder height noted. Motor exam: thin muscle bulk, global strength of 4+ out of 5 as noted, she has no significant resting tremor. She has a postural tremor in both upper extremities, more noticeable in the right hand. She has an action tremor, no significant intention tremor although she does have minimal past pointing on finger to nose testing. Reflexes are 1+ throughout. Fine motor skills and coordination showed global mild to moderate impairment of  fine motor skills, no lateralization. She has moderate to severe difficulty with foot taps bilaterally.    On 08/21/2018: on Archimedes spiral drawing, she has mild tremulousness with the right hand, moderate with the left hand, handwriting with the right hand is tremulous, difficult to read, not particularly micrographic but she did not finish a full sentence. Cerebellar testing: as above.  Sensory exam: intact to light touch in the upper and lower extremities.  Gait, station and balance: She stands with difficulty and has to push himself up. Posture is moderately stooped, also seems to have scoliosis. She walks wider-based, she is not able to stand narrow based and Romberg is not possible to be tested, balance is impaired, she walks slowly and cautiously, she does have preserved arm swing but has difficulty with turns.  Assessment and Plan:   In summary, Elray McgregorHattie S Briggs is a very pleasant 72 y.o.-year old female with an underlying complex medical history of peripheral vascular disease, osteoporosis, depression, hypothyroidism, hypertension, hyperlipidemia, history of stroke, prior smoking, emphysema, on oxygen at night, chronic diastolic congestive heart failure, carotid artery stenosis, recurrent falls, memory loss, tremor, insomnia and mild obesity, who presents for evaluation of her gait disorder and tremors. On examination, she has a nonspecific gait disorder, and given her recurrent falls she is at risk for further falls. Her gait disorder is likely multifactorial in nature secondary to prior falls, aging, severe intracranial stenosis with significant cerebrovascular disease, prior strokes, abnormal posture and potentially taking some sedating medications as well. As far as her tremor, her history is more suggestive of essential  tremor. She does not have telltale parkinsonian findings in keeping with Parkinson's disease, she may have some component of vascular parkinsonism. She is strongly advised  to use her walker at all times for gait safety. She is followed by interventional radiology for intracranial stenosis. She is on dual antiplatelet medication. She is advised to stay well-hydrated, change positions slowly and use her walker. As far as his tremor control, I would not recommend any additional medication for tremor reduction or symptomatic treatment for tremor for fear of side effects. I am not convinced it is worth trying her on Parkinson's medication to see if she improves with regards to her tremor and mobility as she may be at higher risk for side effects and the risk may outweigh the benefit. We did talk about the possibility of doing a DaT scan which can help narrowing down tremor disorders but she is advised that this is not a definitive test for Parkinson's disease. She would like to think about it, she was given information about this scan. Unfortunately, there is not a whole lot I can do for her. I suggested as needed follow-up. I answered all her questions today and the patient and her daughter were in agreement. Thank you very much for allowing me to participate in the care of this nice patient. If I can be of any further assistance to you please do not hesitate to call me at [phone].  Sincerely,   Huston Foley, MD, PhD

## 2018-08-22 ENCOUNTER — Telehealth: Payer: Self-pay | Admitting: *Deleted

## 2018-08-22 NOTE — Telephone Encounter (Signed)
Verbal order given  

## 2018-08-22 NOTE — Telephone Encounter (Signed)
[name] [name] PT called to let us know that due to holidays pt is missing her PT appt today. [name] just needs a verbal order to make up this visit for next week.   CB # [phone]

## 2018-08-22 NOTE — Telephone Encounter (Signed)
Ok to do

## 2018-08-24 DIAGNOSIS — I6522 Occlusion and stenosis of left carotid artery: Secondary | ICD-10-CM | POA: Diagnosis not present

## 2018-08-24 DIAGNOSIS — I5032 Chronic diastolic (congestive) heart failure: Secondary | ICD-10-CM | POA: Diagnosis not present

## 2018-08-24 DIAGNOSIS — I110 Hypertensive heart disease with heart failure: Secondary | ICD-10-CM | POA: Diagnosis not present

## 2018-08-24 DIAGNOSIS — J449 Chronic obstructive pulmonary disease, unspecified: Secondary | ICD-10-CM | POA: Diagnosis not present

## 2018-08-24 DIAGNOSIS — J9611 Chronic respiratory failure with hypoxia: Secondary | ICD-10-CM | POA: Diagnosis not present

## 2018-08-24 DIAGNOSIS — I739 Peripheral vascular disease, unspecified: Secondary | ICD-10-CM | POA: Diagnosis not present

## 2018-08-24 DIAGNOSIS — I69351 Hemiplegia and hemiparesis following cerebral infarction affecting right dominant side: Secondary | ICD-10-CM | POA: Diagnosis not present

## 2018-08-24 DIAGNOSIS — L304 Erythema intertrigo: Secondary | ICD-10-CM | POA: Diagnosis not present

## 2018-08-24 DIAGNOSIS — E785 Hyperlipidemia, unspecified: Secondary | ICD-10-CM | POA: Diagnosis not present

## 2018-08-26 DIAGNOSIS — Z9981 Dependence on supplemental oxygen: Secondary | ICD-10-CM

## 2018-08-26 DIAGNOSIS — L304 Erythema intertrigo: Secondary | ICD-10-CM | POA: Diagnosis not present

## 2018-08-26 DIAGNOSIS — Z79899 Other long term (current) drug therapy: Secondary | ICD-10-CM

## 2018-08-26 DIAGNOSIS — I110 Hypertensive heart disease with heart failure: Secondary | ICD-10-CM | POA: Diagnosis not present

## 2018-08-26 DIAGNOSIS — F331 Major depressive disorder, recurrent, moderate: Secondary | ICD-10-CM

## 2018-08-26 DIAGNOSIS — Z79891 Long term (current) use of opiate analgesic: Secondary | ICD-10-CM

## 2018-08-26 DIAGNOSIS — J449 Chronic obstructive pulmonary disease, unspecified: Secondary | ICD-10-CM | POA: Diagnosis not present

## 2018-08-26 DIAGNOSIS — Z95828 Presence of other vascular implants and grafts: Secondary | ICD-10-CM

## 2018-08-26 DIAGNOSIS — Z7951 Long term (current) use of inhaled steroids: Secondary | ICD-10-CM

## 2018-08-26 DIAGNOSIS — I6522 Occlusion and stenosis of left carotid artery: Secondary | ICD-10-CM | POA: Diagnosis not present

## 2018-08-26 DIAGNOSIS — Z9181 History of falling: Secondary | ICD-10-CM

## 2018-08-26 DIAGNOSIS — J9611 Chronic respiratory failure with hypoxia: Secondary | ICD-10-CM

## 2018-08-26 DIAGNOSIS — Z87891 Personal history of nicotine dependence: Secondary | ICD-10-CM

## 2018-08-26 DIAGNOSIS — Z7982 Long term (current) use of aspirin: Secondary | ICD-10-CM

## 2018-08-26 DIAGNOSIS — I739 Peripheral vascular disease, unspecified: Secondary | ICD-10-CM | POA: Diagnosis not present

## 2018-08-26 DIAGNOSIS — I5032 Chronic diastolic (congestive) heart failure: Secondary | ICD-10-CM | POA: Diagnosis not present

## 2018-08-26 DIAGNOSIS — I69351 Hemiplegia and hemiparesis following cerebral infarction affecting right dominant side: Secondary | ICD-10-CM | POA: Diagnosis not present

## 2018-08-26 DIAGNOSIS — E785 Hyperlipidemia, unspecified: Secondary | ICD-10-CM | POA: Diagnosis not present

## 2018-08-27 DIAGNOSIS — I110 Hypertensive heart disease with heart failure: Secondary | ICD-10-CM | POA: Diagnosis not present

## 2018-08-27 DIAGNOSIS — I739 Peripheral vascular disease, unspecified: Secondary | ICD-10-CM | POA: Diagnosis not present

## 2018-08-27 DIAGNOSIS — J449 Chronic obstructive pulmonary disease, unspecified: Secondary | ICD-10-CM | POA: Diagnosis not present

## 2018-08-27 DIAGNOSIS — E785 Hyperlipidemia, unspecified: Secondary | ICD-10-CM | POA: Diagnosis not present

## 2018-08-27 DIAGNOSIS — L304 Erythema intertrigo: Secondary | ICD-10-CM | POA: Diagnosis not present

## 2018-08-27 DIAGNOSIS — I6522 Occlusion and stenosis of left carotid artery: Secondary | ICD-10-CM | POA: Diagnosis not present

## 2018-08-27 DIAGNOSIS — J9611 Chronic respiratory failure with hypoxia: Secondary | ICD-10-CM | POA: Diagnosis not present

## 2018-08-27 DIAGNOSIS — I5032 Chronic diastolic (congestive) heart failure: Secondary | ICD-10-CM | POA: Diagnosis not present

## 2018-08-27 DIAGNOSIS — I69351 Hemiplegia and hemiparesis following cerebral infarction affecting right dominant side: Secondary | ICD-10-CM | POA: Diagnosis not present

## 2018-08-29 DIAGNOSIS — J9611 Chronic respiratory failure with hypoxia: Secondary | ICD-10-CM | POA: Diagnosis not present

## 2018-08-29 DIAGNOSIS — I5032 Chronic diastolic (congestive) heart failure: Secondary | ICD-10-CM | POA: Diagnosis not present

## 2018-08-29 DIAGNOSIS — E785 Hyperlipidemia, unspecified: Secondary | ICD-10-CM | POA: Diagnosis not present

## 2018-08-29 DIAGNOSIS — I6522 Occlusion and stenosis of left carotid artery: Secondary | ICD-10-CM | POA: Diagnosis not present

## 2018-08-29 DIAGNOSIS — J449 Chronic obstructive pulmonary disease, unspecified: Secondary | ICD-10-CM | POA: Diagnosis not present

## 2018-08-29 DIAGNOSIS — I739 Peripheral vascular disease, unspecified: Secondary | ICD-10-CM | POA: Diagnosis not present

## 2018-08-29 DIAGNOSIS — I110 Hypertensive heart disease with heart failure: Secondary | ICD-10-CM | POA: Diagnosis not present

## 2018-08-29 DIAGNOSIS — I69351 Hemiplegia and hemiparesis following cerebral infarction affecting right dominant side: Secondary | ICD-10-CM | POA: Diagnosis not present

## 2018-08-29 DIAGNOSIS — L304 Erythema intertrigo: Secondary | ICD-10-CM | POA: Diagnosis not present

## 2018-09-05 ENCOUNTER — Other Ambulatory Visit (HOSPITAL_COMMUNITY): Payer: Self-pay | Admitting: Interventional Radiology

## 2018-09-05 DIAGNOSIS — I771 Stricture of artery: Secondary | ICD-10-CM

## 2018-09-06 ENCOUNTER — Telehealth (HOSPITAL_COMMUNITY): Payer: Self-pay | Admitting: Radiology

## 2018-09-06 NOTE — Telephone Encounter (Signed)
Pt's daughter called and left a message to call them back. They had questions concerning the upcoming procedure for pt. I called both [name] [name] and her daughter and answered all of their questions to their satisfaction. Both are in agreement with plan of care and treatment. JM

## 2018-09-11 ENCOUNTER — Telehealth: Payer: Self-pay | Admitting: Student

## 2018-09-11 ENCOUNTER — Other Ambulatory Visit: Payer: Self-pay | Admitting: Radiology

## 2018-09-11 NOTE — Telephone Encounter (Signed)
Called patient to evaluate symptoms per Dr. Corliss Skainseveshwar.  Patient complains of daily headaches. States she takes Naproxen for headaches every day. Denies vision changes or N/V associated with headaches.  Patient complains of intermittent right hand numbness. States that it is not every day.  Patient complains of intermittent dull right neck pain.  Patient complains of intermittent dizziness.  Patient states that all symptoms have been present since "one of my recent hospitalizations". States that the symptoms are "slightly worse" than they were.  Informed Dr. Corliss Skainseveshwar of symptoms.  Waylan Bogalexandra M Louk, PA-C 09/11/2018, 12:44 PM

## 2018-09-11 NOTE — Pre-Procedure Instructions (Signed)
[name] McgregorHattie S [name]  09/11/2018      [facility] - [place] ([place]), [place] [address] [place] [address] Phone: [phone] Fax: [phone]    Your procedure is scheduled on December 19  [facility] at [phone] A.M.  Call this number if you have problems the morning of surgery:  [phone]   Remember:  Do not eat or drink after midnight.      Take these medicines the morning of surgery with A SIP OF WATER  albuterol (PROVENTIL HFA;VENTOLIN HFA bring inhaler with you the day of surgery aspirin EC clopidogrel (PLAVIX)  fexofenadine (ALLEGRA) levothyroxine (SYNTHROID, LEVOTHROID)  sertraline (ZOLOFT) traMADol (ULTRAM)   7 days prior to surgery STOP taking any  Aleve, Naproxen, Ibuprofen, Motrin, Advil, Goody's, BC's, all herbal medications, fish oil, and all vitamins     Do not wear jewelry, make-up or nail polish.  Do not wear lotions, powders, or perfumes, or deodorant.  Do not shave 48 hours prior to surgery.    Do not bring valuables to the hospital.  [facility] is not responsible for any belongings or valuables.  Contacts, dentures or bridgework may not be worn into surgery.  Leave your suitcase in the car.  After surgery it may be brought to your room.  For patients admitted to the hospital, discharge time will be determined by your treatment team.  Patients discharged the day of surgery will not be allowed to drive home.    Special instructions:   - Preparing For Surgery  Before surgery, you can play an important role. Because skin is not sterile, your skin needs to be as free of germs as possible. You can reduce the number of germs on your skin by washing with CHG (chlorahexidine gluconate) Soap before surgery.  CHG is an antiseptic cleaner which kills germs and bonds with the skin to continue killing germs even after washing.    Oral Hygiene is also important to reduce  your risk of infection.  Remember - BRUSH YOUR TEETH THE MORNING OF SURGERY WITH YOUR REGULAR TOOTHPASTE  Please do not use if you have an allergy to CHG or antibacterial soaps. If your skin becomes reddened/irritated stop using the CHG.  Do not shave (including legs and underarms) for at least 48 hours prior to first CHG shower. It is OK to shave your face.  Please follow these instructions carefully.   1. Shower the NIGHT BEFORE SURGERY and the MORNING OF SURGERY with CHG.   2. If you chose to wash your hair, wash your hair first as usual with your normal shampoo.  3. After you shampoo, rinse your hair and body thoroughly to remove the shampoo.  4. Use CHG as you would any other liquid soap. You can apply CHG directly to the skin and wash gently with a scrungie or a clean washcloth.   5. Apply the CHG Soap to your body ONLY FROM THE NECK DOWN.  Do not use on open wounds or open sores. Avoid contact with your eyes, ears, mouth and genitals (private parts). Wash Face and genitals (private parts)  with your normal soap.  6. Wash thoroughly, paying special attention to the area where your surgery will be performed.  7. Thoroughly rinse your body with warm water from the neck down.  8. DO NOT shower/wash with your normal soap after using and rinsing off the CHG Soap.  9. Pat yourself  dry with a CLEAN TOWEL.  10. Wear CLEAN PAJAMAS to bed the night before surgery, wear comfortable clothes the morning of surgery  11. Place CLEAN SHEETS on your bed the night of your first shower and DO NOT SLEEP WITH PETS.    Day of Surgery:  Do not apply any deodorants/lotions.  Please wear clean clothes to the hospital/surgery center.   Remember to brush your teeth WITH YOUR REGULAR TOOTHPASTE.    Please read over the following fact sheets that you were given.

## 2018-09-12 ENCOUNTER — Inpatient Hospital Stay (HOSPITAL_COMMUNITY)
Admission: RE | Admit: 2018-09-12 | Discharge: 2018-09-12 | Disposition: A | Discharge status: Home / Self Care | Payer: Medicare HMO | Source: Ambulatory Visit

## 2018-09-13 ENCOUNTER — Ambulatory Visit (HOSPITAL_COMMUNITY): Admission: RE | Admit: 2018-09-13 | Payer: Medicare HMO | Source: Home / Self Care | Admitting: Interventional Radiology

## 2018-09-13 ENCOUNTER — Encounter (HOSPITAL_COMMUNITY): Payer: Self-pay

## 2018-09-13 ENCOUNTER — Encounter (HOSPITAL_COMMUNITY): Admission: RE | Payer: Self-pay | Source: Home / Self Care

## 2018-09-13 ENCOUNTER — Ambulatory Visit (HOSPITAL_COMMUNITY): Admission: RE | Admit: 2018-09-13 | Payer: Medicare HMO | Source: Ambulatory Visit

## 2018-09-13 SURGERY — RADIOLOGY WITH ANESTHESIA
Anesthesia: General

## 2018-09-16 DIAGNOSIS — J449 Chronic obstructive pulmonary disease, unspecified: Secondary | ICD-10-CM | POA: Diagnosis not present

## 2018-09-16 DIAGNOSIS — R269 Unspecified abnormalities of gait and mobility: Secondary | ICD-10-CM | POA: Diagnosis not present

## 2018-10-17 DIAGNOSIS — R269 Unspecified abnormalities of gait and mobility: Secondary | ICD-10-CM | POA: Diagnosis not present

## 2018-10-17 DIAGNOSIS — J449 Chronic obstructive pulmonary disease, unspecified: Secondary | ICD-10-CM | POA: Diagnosis not present

## 2018-11-17 DIAGNOSIS — J449 Chronic obstructive pulmonary disease, unspecified: Secondary | ICD-10-CM | POA: Diagnosis not present

## 2018-11-17 DIAGNOSIS — R269 Unspecified abnormalities of gait and mobility: Secondary | ICD-10-CM | POA: Diagnosis not present

## 2018-11-19 ENCOUNTER — Encounter: Payer: Self-pay | Admitting: Family Medicine

## 2018-11-19 ENCOUNTER — Ambulatory Visit (INDEPENDENT_AMBULATORY_CARE_PROVIDER_SITE_OTHER): Payer: Medicare HMO | Admitting: Family Medicine

## 2018-11-19 VITALS — BP 160/80 | HR 86 | Temp 97.6°F | Ht 61.5 in | Wt 161.2 lb

## 2018-11-19 DIAGNOSIS — I69351 Hemiplegia and hemiparesis following cerebral infarction affecting right dominant side: Secondary | ICD-10-CM | POA: Diagnosis not present

## 2018-11-19 DIAGNOSIS — H9313 Tinnitus, bilateral: Secondary | ICD-10-CM | POA: Diagnosis not present

## 2018-11-19 DIAGNOSIS — R4182 Altered mental status, unspecified: Secondary | ICD-10-CM | POA: Diagnosis not present

## 2018-11-19 DIAGNOSIS — I6523 Occlusion and stenosis of bilateral carotid arteries: Secondary | ICD-10-CM | POA: Diagnosis not present

## 2018-11-19 DIAGNOSIS — I739 Peripheral vascular disease, unspecified: Secondary | ICD-10-CM

## 2018-11-19 DIAGNOSIS — I10 Essential (primary) hypertension: Secondary | ICD-10-CM

## 2018-11-19 DIAGNOSIS — Z8673 Personal history of transient ischemic attack (TIA), and cerebral infarction without residual deficits: Secondary | ICD-10-CM | POA: Diagnosis not present

## 2018-11-19 LAB — POCT URINALYSIS DIPSTICK
Bilirubin, UA: NEGATIVE
Blood, UA: NEGATIVE
Glucose, UA: NEGATIVE
Ketones, UA: NEGATIVE
Leukocytes, UA: NEGATIVE
Nitrite, UA: NEGATIVE
Protein, UA: NEGATIVE
Spec Grav, UA: 1.02 (ref 1.010–1.025)
Urobilinogen, UA: 0.2 E.U./dL
pH, UA: 6 (ref 5.0–8.0)

## 2018-11-19 MED ORDER — ALBUTEROL SULFATE HFA 108 (90 BASE) MCG/ACT IN AERS: 2.0000 | INHALATION_SPRAY | Freq: Four times a day (QID) | RESPIRATORY_TRACT | 2 refills | Status: DC | PRN

## 2018-11-19 NOTE — Progress Notes (Signed)
BP (!) 160/80 (BP Location: Right Arm, Cuff Size: Normal)   Pulse 86   Temp 97.6 F (36.4 C)   Ht 5' 1.5" (1.562 m)   Wt 161 lb 4 oz (73.1 kg)   SpO2 100%   BMI 29.97 kg/m    CC: tinnitus, AMS, off balance Subjective:    Patient ID: [name] [name], female    DOB: 1946/02/28, 73 y.o.   MRN: [id number]  HPI: [name] McgregorHattie S Rosano is a 73 y.o. female presenting on 11/19/2018 for Tinnitus (x 1 month. Balance is off. ) and Altered Mental Status (patient's daughter states she has noticed patient is more confused.)   See prior note for details. Hospitalized 07/2018 for stroke like symptoms (L vision loss, L facial droop and numbness) - reassuring workup including MRI and MRA. On plavix and aspirin 325mg  regularly, with goal BP 130-150 systolic.   1 mo h/o bilateral ringing in ears that's constant, not pulsatile. Denies noise exposure. Aspirin dose was increased to 325mg  07/2018. She also takes naprosyn twice a week PRN for headaches.   In the last 2 weeks noticing increasing imbalance and confusion.  Stronger odor to urine noted by daughter. Low grade fever last week.  Denies dysuria, frequency, hematuria, abd pain, flank pain, nausea.      Relevant past medical, surgical, family and social history reviewed and updated as indicated. Interim medical history since our last visit reviewed. Allergies and medications reviewed and updated. Outpatient Medications Prior to Visit  Medication Sig Dispense Refill  . aspirin EC 325 MG EC tablet Take 1 tablet (325 mg total) by mouth daily. 30 tablet 0  . atorvastatin (LIPITOR) 40 MG tablet TAKE 1 TABLET (40 MG TOTAL) BY MOUTH DAILY. 90 tablet 2  . benazepril (LOTENSIN) 5 MG tablet Take 1 tablet (5 mg total) by mouth daily. 30 tablet 3  . Cholecalciferol (VITAMIN D3) 25 MCG (1000 UT) CAPS Take 1,000 Units by mouth daily.    . clopidogrel (PLAVIX) 75 MG tablet TAKE 1 TABLET EVERY DAY (Patient taking differently: Take 75 mg by mouth daily. ) 90 tablet  1  . fexofenadine (ALLEGRA) 180 MG tablet Take 180 mg by mouth daily.    . fluticasone-salmeterol (ADVAIR HFA) 115-21 MCG/ACT inhaler Inhale 2 puffs into the lungs 2 (two) times daily. Rinse mouth after use. 1 Inhaler 12  . furosemide (LASIX) 20 MG tablet Take 1 tablet (20 mg total) by mouth as needed for fluid or edema. May take once or twice a week for swelling (Patient taking differently: Take 20 mg by mouth daily as needed for fluid or edema. ) 40 tablet 1  . levothyroxine (SYNTHROID, LEVOTHROID) 50 MCG tablet Take 1 tablet (50 mcg total) by mouth daily before breakfast. 90 tablet 1  . Multiple Vitamins-Minerals (MULTIVITAMIN PO) Take 1 tablet by mouth daily.    [name]. nystatin (MYCOSTATIN/NYSTOP) powder Apply topically 3 (three) times daily. 30 g 0  . OXYGEN Inhale 2 L into the lungs at bedtime.    . Potassium Chloride ER 20 MEQ TBCR Take 20 mEq by mouth daily as needed (with Lasix).     [name]. sertraline (ZOLOFT) 100 MG tablet Take 1 tablet (100 mg total) by mouth daily. 90 tablet 3  . traMADol (ULTRAM) 50 MG tablet Take 1-2 tablets (50-100 mg total) by mouth 3 (three) times daily as needed for moderate pain. 30 tablet 0  . traZODone (DESYREL) 100 MG tablet Take 1 tablet (100 mg total) by mouth at bedtime. 90  tablet 3  . albuterol (PROVENTIL HFA;VENTOLIN HFA) 108 (90 Base) MCG/ACT inhaler Inhale 2 puffs into the lungs every 6 (six) hours as needed for wheezing or shortness of breath. 1 Inhaler 2   No facility-administered medications prior to visit.      Per HPI unless specifically indicated in ROS section below Review of Systems Objective:    BP (!) 160/80 (BP Location: Right Arm, Cuff Size: Normal)   Pulse 86   Temp 97.6 F (36.4 C)   Ht 5' 1.5" (1.562 m)   Wt 161 lb 4 oz (73.1 kg)   SpO2 100%   BMI 29.97 kg/m   Wt Readings from Last 3 Encounters:  11/19/18 161 lb 4 oz (73.1 kg)  08/21/18 156 lb (70.8 kg)  08/15/18 154 lb 8 oz (70.1 kg)    Physical Exam Vitals signs and nursing note  reviewed.  Constitutional:      Appearance: Normal appearance. She is not ill-appearing.  HENT:     Right Ear: Ear canal normal.     Left Ear: Ear canal normal.     Mouth/Throat:     Mouth: Mucous membranes are moist.     Pharynx: Oropharynx is clear. No oropharyngeal exudate.  Eyes:     Extraocular Movements: Extraocular movements intact.     Conjunctiva/sclera: Conjunctivae normal.     Pupils: Pupils are equal, round, and reactive to light.  Cardiovascular:     Rate and Rhythm: Normal rate and regular rhythm.     Pulses: Normal pulses.     Heart sounds: Normal heart sounds. No murmur.  Pulmonary:     Effort: Pulmonary effort is normal. No respiratory distress.     Breath sounds: Normal breath sounds. No wheezing, rhonchi or rales.  Musculoskeletal:     Right lower leg: No edema.     Left lower leg: No edema.  Neurological:     General: No focal deficit present.     Mental Status: She is alert.     Coordination: Romberg sign negative (unsteady but able to maintain balance).     Gait: Gait is intact.     Comments: No pronator drift  Psychiatric:        Mood and Affect: Mood normal.        Behavior: Behavior normal.       Results for orders placed or performed in visit on 11/19/18  POCT urinalysis dipstick  Result Value Ref Range   Color, UA light yellow    Clarity, UA clear    Glucose, UA Negative Negative   Bilirubin, UA negative    Ketones, UA negative    Spec Grav, UA 1.020 1.010 - 1.025   Blood, UA negative    pH, UA 6.0 5.0 - 8.0   Protein, UA Negative Negative   Urobilinogen, UA 0.2 0.2 or 1.0 E.U./dL   Nitrite, UA negative    Leukocytes, UA Negative Negative   Appearance     Odor     Assessment & Plan:   Problem List Items Addressed This Visit    Tinnitus of both ears    Anticipate related to recently increased aspirin to 325mg  daily. She has also been taking NSAID - advised change to tylenol PRN.       Peripheral vascular disease (HCC)    Hypertension    Only on benazepril 5mg  daily. When she arrived, SBP 110s, however on recheck returned 160. No med changes made today, advised start monitoring more closely and update me  with readings. Low threshold to change antihypertensive regimen to maintain control at SBP 130-150.       History of lacunar cerebrovascular accident (CVA)   Hemiparesis affecting right side as late effect of stroke (HCC)   Carotid stenosis   Altered mental status - Primary    Increased confusion noted by daughter associated with increasing imbalance.  Check UA today - no evidence of infection.  Discussed possible causes - may be related to fluctuations in blood pressure. See above. Known LICA stenosis. Overdue for IR f/u - advised they call IR for f/u.       Relevant Orders   POCT urinalysis dipstick (Completed)       Meds ordered this encounter  Medications  . albuterol (PROVENTIL HFA;VENTOLIN HFA) 108 (90 Base) MCG/ACT inhaler    Sig: Inhale 2 puffs into the lungs every 6 (six) hours as needed for wheezing or shortness of breath.    Dispense:  1 Inhaler    Refill:  2   Orders Placed This Encounter  Procedures  . POCT urinalysis dipstick    Patient instructions: Aspirin may be contributing to ringing in ears  Take tylenol in place of naprosyn for headaches.  Blood pressure is a bit high today - start monitoring BP at home, let us know if consistently running too low (<120) or too high (>160).   Follow up plan: Return if symptoms worsen or fail to improve.  [name], MD

## 2018-11-19 NOTE — Patient Instructions (Addendum)
Aspirin may be contributing to ringing in ears  Take tylenol in place of naprosyn for headaches.  Blood pressure is a bit high today - start monitoring BP at home, let us know if consistently running too low (<120) or too high (>160).   Tinnitus Tinnitus refers to hearing a sound when there is no actual source for that sound. This is often described as ringing in the ears. However, people with this condition may hear a variety of noises, in one ear or in both ears. The sounds of tinnitus can be soft, loud, or somewhere in between. Tinnitus can last for a few seconds or can be constant for days. It may go away without treatment and come back at various times. When tinnitus is constant or happens often, it can lead to other problems, such as trouble sleeping and trouble concentrating. Almost everyone experiences tinnitus at some point. Tinnitus that is long-lasting (chronic) or comes back often (recurs) may require medical attention. What are the causes? The cause of tinnitus is often not known. In some cases, it can result from other problems or conditions, including:  Exposure to loud noises from machinery, music, or other sources.  Hearing loss.  Ear or sinus infections.  Earwax buildup.  An object (foreign body) stuck in the ear.  Taking certain medicines.  Drinking alcohol or caffeine.  High blood pressure.  Heart diseases.  Anemia.  Allergies.  Meniere's disease.  Thyroid problems.  Tumors.  A weak, bulging blood vessel (aneurysm) near the ear.  Depression or other mood disorders. What are the signs or symptoms? The main symptom of tinnitus is hearing a sound when there is no source for that sound. It may sound like:  Buzzing.  Roaring.  Ringing.  Blowing air, like the sound heard when you listen to a seashell.  Hissing.  Whistling.  Sizzling.  Humming.  Running water.  A musical note.  Tapping. Symptoms may affect only one ear (unilateral) or  both ears (bilateral). How is this diagnosed? Tinnitus is diagnosed based on your symptoms, your medical history, and a physical exam. Your health care provider may do a thorough hearing test (audiologic exam) if your tinnitus:  Is unilateral.  Causes hearing difficulties.  Lasts 6 months or longer. You may work with a health care provider who specializes in hearing disorders (audiologist). You may be asked questions about your symptoms and how they affect your daily life. You may have other tests done, such as:  CT scan.  MRI.  An imaging test of how blood flows through your blood vessels (angiogram). How is this treated? Treating an underlying medical condition can sometimes make tinnitus go away. If your tinnitus continues, other treatments may include:  Medicines, such as antidepressants or sleeping aids.  Sound generators to mask the tinnitus. These include: ? Tabletop sound machines that play relaxing sounds to help you fall asleep. ? Wearable devices that fit in your ear and play sounds or music. ? Acoustic neural stimulation. This involves using headphones to listen to music that contains an auditory signal. Over time, listening to this signal may change some pathways in your brain and make you less sensitive to tinnitus. This treatment is used for very severe cases when no other treatment is working.  Therapy and counseling to help you manage the stress of living with tinnitus.  Using hearing aids or cochlear implants if your tinnitus is related to hearing loss. Hearing aids are worn in the outer ear. Cochlear implants are surgically  placed in the inner ear. Follow these instructions at home: Managing symptoms      When possible, avoid being in loud places and being exposed to loud sounds.  Wear hearing protection, such as earplugs, when you are exposed to loud noises.  Use a white noise machine, a humidifier, or other devices to mask the sound of tinnitus.  Practice  techniques for reducing stress, such as meditation, yoga, or deep breathing. Work with your health care provider if you need help with managing stress.  Sleep with your head slightly raised. This may reduce the impact of tinnitus. General instructions  Do not use stimulants, such as nicotine, alcohol, or caffeine. Talk with your health care provider about other stimulants to avoid. Stimulants are substances that can make you feel alert and attentive by increasing certain activities in the body (such as heart rate and blood pressure). These substances may make tinnitus worse.  Take over-the-counter and prescription medicines only as told by your health care provider.  Try to get plenty of sleep each night.  Keep all follow-up visits as told by your health care provider. This is important. Contact a health care provider if:  Your tinnitus continues for 3 weeks or longer without stopping.  Your symptoms get worse or do not get better with home care.  You develop tinnitus after a head injury.  You have tinnitus along with any of the following: ? Dizziness. ? Loss of balance. ? Nausea and vomiting. Summary  Tinnitus refers to hearing a sound when there is no actual source for that sound. This is often described as ringing in the ears.  Symptoms may affect only one ear (unilateral) or both ears (bilateral).  Use a white noise machine, a humidifier, or other devices to mask the sound of tinnitus.  Do not use stimulants, such as nicotine, alcohol, or caffeine. Talk with your health care provider about other stimulants to avoid. These substances may make tinnitus worse. This information is not intended to replace advice given to you by your health care provider. Make sure you discuss any questions you have with your health care provider. Document Released: 09/12/2005 Document Revised: 06/22/2017 Document Reviewed: 06/22/2017 Elsevier Interactive Patient Education  2019 [employer].

## 2018-11-24 DIAGNOSIS — H9313 Tinnitus, bilateral: Secondary | ICD-10-CM | POA: Insufficient documentation

## 2018-11-24 NOTE — Assessment & Plan Note (Signed)
Anticipate related to recently increased aspirin to 325mg  daily. She has also been taking NSAID - advised change to tylenol PRN.

## 2018-11-24 NOTE — Assessment & Plan Note (Signed)
Only on benazepril 5mg  daily. When she arrived, SBP 110s, however on recheck returned 160. No med changes made today, advised start monitoring more closely and update me with readings. Low threshold to change antihypertensive regimen to maintain control at SBP 130-150.

## 2018-11-24 NOTE — Assessment & Plan Note (Addendum)
Increased confusion noted by daughter associated with increasing imbalance.  Check UA today - no evidence of infection.  Discussed possible causes - may be related to fluctuations in blood pressure. See above. Known LICA stenosis. Overdue for IR f/u - advised they call IR for f/u.

## 2018-12-01 ENCOUNTER — Other Ambulatory Visit: Payer: Self-pay | Admitting: Family Medicine

## 2018-12-04 ENCOUNTER — Other Ambulatory Visit: Payer: Self-pay

## 2018-12-04 ENCOUNTER — Encounter (HOSPITAL_COMMUNITY): Payer: Self-pay

## 2018-12-04 ENCOUNTER — Encounter (HOSPITAL_COMMUNITY)
Admission: RE | Admit: 2018-12-04 | Discharge: 2018-12-04 | Disposition: A | Discharge status: Home / Self Care | Payer: Medicare HMO | Source: Ambulatory Visit | Attending: Rheumatology | Admitting: Rheumatology

## 2018-12-04 DIAGNOSIS — Z01812 Encounter for preprocedural laboratory examination: Secondary | ICD-10-CM | POA: Diagnosis not present

## 2018-12-04 LAB — CBC WITH DIFFERENTIAL/PLATELET
Abs Immature Granulocytes: 0.02 10*3/uL (ref 0.00–0.07)
Basophils Absolute: 0.1 10*3/uL (ref 0.0–0.1)
Basophils Relative: 1 %
Eosinophils Absolute: 0.2 10*3/uL (ref 0.0–0.5)
Eosinophils Relative: 3 %
HCT: 39.5 % (ref 36.0–46.0)
Hemoglobin: 12.3 g/dL (ref 12.0–15.0)
Immature Granulocytes: 0 %
Lymphocytes Relative: 20 %
Lymphs Abs: 1.3 10*3/uL (ref 0.7–4.0)
MCH: 30.5 pg (ref 26.0–34.0)
MCHC: 31.1 g/dL (ref 30.0–36.0)
MCV: 98 fL (ref 80.0–100.0)
Monocytes Absolute: 0.5 10*3/uL (ref 0.1–1.0)
Monocytes Relative: 8 %
Neutro Abs: 4.4 10*3/uL (ref 1.7–7.7)
Neutrophils Relative %: 68 %
Platelets: 158 10*3/uL (ref 150–400)
RBC: 4.03 MIL/uL (ref 3.87–5.11)
RDW: 12.8 % (ref 11.5–15.5)
WBC: 6.5 10*3/uL (ref 4.0–10.5)
nRBC: 0 % (ref 0.0–0.2)

## 2018-12-04 LAB — BASIC METABOLIC PANEL
Anion gap: 10 (ref 5–15)
BUN: 24 mg/dL — ABNORMAL HIGH (ref 8–23)
CO2: 27 mmol/L (ref 22–32)
Calcium: 10.2 mg/dL (ref 8.9–10.3)
Chloride: 105 mmol/L (ref 98–111)
Creatinine, Ser: 1.04 mg/dL — ABNORMAL HIGH (ref 0.44–1.00)
GFR calc Af Amer: >60 mL/min (ref >60)
GFR calc non Af Amer: 54 mL/min — ABNORMAL LOW (ref >60)
Glucose, Bld: 96 mg/dL (ref 70–99)
Potassium: 4.2 mmol/L (ref 3.5–5.1)
Sodium: 142 mmol/L (ref 135–145)

## 2018-12-04 LAB — PLATELET INHIBITION P2Y12: Platelet Function  P2Y12: 164 [PRU] — ABNORMAL LOW (ref 182–335)

## 2018-12-04 LAB — PROTIME-INR
INR: 1 (ref 0.8–1.2)
Prothrombin Time: 13.4 seconds (ref 11.4–15.2)

## 2018-12-04 NOTE — Progress Notes (Addendum)
PCP - [name], MD Cardiologist - pt denies  Chest x-ray - pt denies EKG - 07/2018 in [facility]  Stress Test - pt denies ECHO - 07/2018 in [facility]  Cardiac Cath - pt denies  Sleep Study - pt denies CPAP - n/a  Fasting Blood Sugar - n/a Checks Blood Sugar _____ times a day-n/a  Blood Thinner Instructions: Plavix-continue per MD Aspirin Instructions: Aspirin 325 mg-continue per MD   Anesthesia review:  Yes- per order  Patient denies shortness of breath, fever, cough and chest pain at PAT appointment  Patient verbalized understanding of instructions that were given to them at the PAT appointment. Patient was also instructed that they will need to review over the PAT instructions again at home before surgery.

## 2018-12-04 NOTE — Pre-Procedure Instructions (Signed)
[address]  12/04/2018      [id number] - [place] ([place]), [place] - [address]  ([address]) [place] [address] Phone: [phone] Fax: [phone]  [facility] - [place], [place] - [address] [place] [address] Phone: [phone] Fax: [phone]    Your procedure is scheduled on December 10, 2018.  Report to [facility] Entrance "A" at 530 AM.  Call this number if you have problems the morning of surgery:  [phone]   Remember:  Do not eat or drink after midnight.     Take these medicines the morning of surgery with A SIP OF WATER  Plavix  Aspirin Advair inhaler Levothyroxine (synthroid) Sertraline (zoloft) Tramadol (ultram)-if needed IF NEEDED: Allegra Albuterol inhaler-bring inhaler with you  Continue Aspirin and Plavix and take day of procedure as instructed by Dr. Corliss Skains  Beginning now, STOP taking any NSAIDS: Aleve, Naproxen, Ibuprofen, Motrin, Advil, Goody's, BC's, all herbal medications, fish oil, and all vitamins    Do not wear jewelry, make-up or nail polish.  Do not wear lotions, powders, or perfumes, or deodorant.  Do not shave 48 hours prior to surgery.    Do not bring valuables to the hospital.  [facility] is not responsible for any belongings or valuables.  Contacts, dentures or bridgework may not be worn into surgery.  Leave your suitcase in the car.  After surgery it may be brought to your room.  For patients admitted to the hospital, discharge time will be determined by your treatment team.  Patients discharged the day of surgery will not be allowed to drive home.    [place]- Preparing For Surgery  Before surgery, you can play an important role. Because skin is not sterile, your skin needs to be as free of germs as possible. You can reduce the number of germs on your skin by washing with CHG (chlorahexidine gluconate) Soap before surgery.  CHG is an  antiseptic cleaner which kills germs and bonds with the skin to continue killing germs even after washing.    Oral Hygiene is also important to reduce your risk of infection.  Remember - BRUSH YOUR TEETH THE MORNING OF SURGERY WITH YOUR REGULAR TOOTHPASTE  Please do not use if you have an allergy to CHG or antibacterial soaps. If your skin becomes reddened/irritated stop using the CHG.  Do not shave (including legs and underarms) for at least 48 hours prior to first CHG shower. It is OK to shave your face.  Please follow these instructions carefully.   1. Shower the NIGHT BEFORE SURGERY and the MORNING OF SURGERY with CHG.   2. If you chose to wash your hair, wash your hair first as usual with your normal shampoo.  3. After you shampoo, rinse your hair and body thoroughly to remove the shampoo.  4. Use CHG as you would any other liquid soap. You can apply CHG directly to the skin and wash gently with a scrungie or a clean washcloth.   5. Apply the CHG Soap to your body ONLY FROM THE NECK DOWN.  Do not use on open wounds or open sores. Avoid contact with your eyes, ears, mouth and genitals (private parts). Wash Face and genitals (private parts)  with your normal soap.  6. Wash thoroughly, paying special attention to the area where your surgery will be performed.  7. Thoroughly rinse your body with warm water from the neck  down.  8. DO NOT shower/wash with your normal soap after using and rinsing off the CHG Soap.  9. Pat yourself dry with a CLEAN TOWEL.  10. Wear CLEAN PAJAMAS to bed the night before surgery, wear comfortable clothes the morning of surgery  11. Place CLEAN SHEETS on your bed the night of your first shower and DO NOT SLEEP WITH PETS.  Day of Surgery:  Do not apply any deodorants/lotions.  Please wear clean clothes to the hospital/surgery center.   Remember to brush your teeth WITH YOUR REGULAR TOOTHPASTE.   Please read over the following fact sheets that you were  given.

## 2018-12-05 NOTE — Anesthesia Preprocedure Evaluation (Addendum)
Anesthesia Evaluation  Patient identified by MRN, date of birth, ID band Patient awake    Reviewed: Allergy & Precautions, NPO status , Patient's Chart, lab work & pertinent test results  Airway Mallampati: II  TM Distance: >3 FB Neck ROM: Full    Dental no notable dental hx. (+) Edentulous Upper, Poor Dentition   Pulmonary COPD (on 2L O2 at night),  COPD inhaler and oxygen dependent, former smoker,    Pulmonary exam normal breath sounds clear to auscultation       Cardiovascular hypertension, + Peripheral Vascular Disease and +CHF  Normal cardiovascular exam Rhythm:Regular Rate:Normal  TTE 07/2018  EF 55-60%, mild AS, mild AI  MRA Head 07/2018 1. No acute intracranial abnormality. 2. Severe atrophy with findings of chronic ischemic microangiopathy. 3. Unchanged severe stenosis of the distal cavernous segment of the left internal carotid artery. 4. Unchanged moderate right M1 MCA segment narrowing.   Neuro/Psych PSYCHIATRIC DISORDERS Anxiety Depression CVA, Residual Symptoms    GI/Hepatic negative GI ROS, Neg liver ROS,   Endo/Other  Hypothyroidism   Renal/GU negative Renal ROS  negative genitourinary   Musculoskeletal  (+) Arthritis ,   Abdominal   Peds  Hematology negative hematology ROS (+)   Anesthesia Other Findings On plavix, no missed doses  Reproductive/Obstetrics                           Anesthesia Physical Anesthesia Plan  ASA: III  Anesthesia Plan: MAC   Post-op Pain Management:    Induction: Intravenous  PONV Risk Score and Plan: 2 and Treatment may vary due to age or medical condition  Airway Management Planned: Natural Airway  Additional Equipment:   Intra-op Plan:   Post-operative Plan:   Informed Consent: I have reviewed the patients History and Physical, chart, labs and discussed the procedure including the risks, benefits and alternatives for the proposed  anesthesia with the patient or authorized representative who has indicated his/her understanding and acceptance.     Dental advisory given  Plan Discussed with: CRNA  Anesthesia Plan Comments:       Anesthesia Quick Evaluation

## 2018-12-07 ENCOUNTER — Other Ambulatory Visit: Payer: Self-pay | Admitting: Radiology

## 2018-12-10 ENCOUNTER — Ambulatory Visit (HOSPITAL_COMMUNITY): Payer: Medicare HMO | Admitting: Anesthesiology

## 2018-12-10 ENCOUNTER — Encounter (HOSPITAL_COMMUNITY)
Admission: RE | Disposition: A | Discharge status: Home / Self Care | Payer: Self-pay | Source: Home / Self Care | Attending: Interventional Radiology

## 2018-12-10 ENCOUNTER — Telehealth: Payer: Self-pay | Admitting: *Deleted

## 2018-12-10 ENCOUNTER — Ambulatory Visit (HOSPITAL_COMMUNITY)
Admission: RE | Admit: 2018-12-10 | Discharge: 2018-12-10 | Disposition: A | Discharge status: Home / Self Care | Payer: Medicare HMO | Source: Ambulatory Visit | Attending: Interventional Radiology | Admitting: Interventional Radiology

## 2018-12-10 ENCOUNTER — Other Ambulatory Visit: Payer: Self-pay

## 2018-12-10 ENCOUNTER — Ambulatory Visit (HOSPITAL_COMMUNITY)
Admission: RE | Admit: 2018-12-10 | Discharge: 2018-12-10 | Disposition: A | Discharge status: Home / Self Care | Payer: Medicare HMO | Attending: Interventional Radiology | Admitting: Interventional Radiology

## 2018-12-10 ENCOUNTER — Ambulatory Visit (HOSPITAL_COMMUNITY): Payer: Medicare HMO | Admitting: Physician Assistant

## 2018-12-10 ENCOUNTER — Encounter (HOSPITAL_COMMUNITY): Payer: Self-pay

## 2018-12-10 DIAGNOSIS — I771 Stricture of artery: Secondary | ICD-10-CM

## 2018-12-10 DIAGNOSIS — I10 Essential (primary) hypertension: Secondary | ICD-10-CM | POA: Insufficient documentation

## 2018-12-10 DIAGNOSIS — M1990 Unspecified osteoarthritis, unspecified site: Secondary | ICD-10-CM | POA: Diagnosis not present

## 2018-12-10 DIAGNOSIS — Z79890 Hormone replacement therapy (postmenopausal): Secondary | ICD-10-CM | POA: Diagnosis not present

## 2018-12-10 DIAGNOSIS — Z8673 Personal history of transient ischemic attack (TIA), and cerebral infarction without residual deficits: Secondary | ICD-10-CM | POA: Diagnosis not present

## 2018-12-10 DIAGNOSIS — Z881 Allergy status to other antibiotic agents status: Secondary | ICD-10-CM | POA: Diagnosis not present

## 2018-12-10 DIAGNOSIS — Z955 Presence of coronary angioplasty implant and graft: Secondary | ICD-10-CM | POA: Diagnosis not present

## 2018-12-10 DIAGNOSIS — Z7902 Long term (current) use of antithrombotics/antiplatelets: Secondary | ICD-10-CM | POA: Diagnosis not present

## 2018-12-10 DIAGNOSIS — R8290 Unspecified abnormal findings in urine: Secondary | ICD-10-CM

## 2018-12-10 DIAGNOSIS — J449 Chronic obstructive pulmonary disease, unspecified: Secondary | ICD-10-CM | POA: Diagnosis not present

## 2018-12-10 DIAGNOSIS — R410 Disorientation, unspecified: Secondary | ICD-10-CM

## 2018-12-10 DIAGNOSIS — Z8679 Personal history of other diseases of the circulatory system: Secondary | ICD-10-CM | POA: Insufficient documentation

## 2018-12-10 DIAGNOSIS — Z79899 Other long term (current) drug therapy: Secondary | ICD-10-CM | POA: Insufficient documentation

## 2018-12-10 DIAGNOSIS — Z7982 Long term (current) use of aspirin: Secondary | ICD-10-CM | POA: Insufficient documentation

## 2018-12-10 DIAGNOSIS — Z8249 Family history of ischemic heart disease and other diseases of the circulatory system: Secondary | ICD-10-CM | POA: Diagnosis not present

## 2018-12-10 DIAGNOSIS — E785 Hyperlipidemia, unspecified: Secondary | ICD-10-CM | POA: Diagnosis not present

## 2018-12-10 DIAGNOSIS — M810 Age-related osteoporosis without current pathological fracture: Secondary | ICD-10-CM | POA: Diagnosis not present

## 2018-12-10 DIAGNOSIS — I6523 Occlusion and stenosis of bilateral carotid arteries: Secondary | ICD-10-CM | POA: Diagnosis not present

## 2018-12-10 DIAGNOSIS — I739 Peripheral vascular disease, unspecified: Secondary | ICD-10-CM | POA: Diagnosis not present

## 2018-12-10 DIAGNOSIS — Z87891 Personal history of nicotine dependence: Secondary | ICD-10-CM | POA: Insufficient documentation

## 2018-12-10 DIAGNOSIS — I6521 Occlusion and stenosis of right carotid artery: Secondary | ICD-10-CM | POA: Diagnosis not present

## 2018-12-10 DIAGNOSIS — I6601 Occlusion and stenosis of right middle cerebral artery: Secondary | ICD-10-CM | POA: Diagnosis not present

## 2018-12-10 DIAGNOSIS — I6522 Occlusion and stenosis of left carotid artery: Secondary | ICD-10-CM | POA: Diagnosis not present

## 2018-12-10 HISTORY — PX: RADIOLOGY WITH ANESTHESIA: SHX6223

## 2018-12-10 HISTORY — PX: IR ANGIO VERTEBRAL SEL VERTEBRAL UNI L MOD SED: IMG5367

## 2018-12-10 HISTORY — PX: IR ANGIO VERTEBRAL SEL SUBCLAVIAN INNOMINATE UNI R MOD SED: IMG5365

## 2018-12-10 HISTORY — PX: IR ANGIO INTRA EXTRACRAN SEL COM CAROTID INNOMINATE BILAT MOD SED: IMG5360

## 2018-12-10 LAB — URINALYSIS, ROUTINE W REFLEX MICROSCOPIC
Bilirubin Urine: NEGATIVE
Glucose, UA: NEGATIVE mg/dL
Hgb urine dipstick: NEGATIVE
Ketones, ur: NEGATIVE mg/dL
Nitrite: NEGATIVE
Protein, ur: NEGATIVE mg/dL
Specific Gravity, Urine: 1.023 (ref 1.005–1.030)
pH: 5 (ref 5.0–8.0)

## 2018-12-10 LAB — PLATELET INHIBITION P2Y12: Platelet Function  P2Y12: 186 [PRU] (ref 182–335)

## 2018-12-10 SURGERY — IR WITH ANESTHESIA
Anesthesia: Monitor Anesthesia Care

## 2018-12-10 MED ORDER — CEFAZOLIN SODIUM-DEXTROSE 2-4 GM/100ML-% IV SOLN
2.0000 g | INTRAVENOUS | Status: AC
  Administered 2018-12-10: 2 g via INTRAVENOUS
  Filled 2018-12-10: qty 100

## 2018-12-10 MED ORDER — LACTATED RINGERS IV SOLN
INTRAVENOUS | Status: DC | PRN
  Administered 2018-12-10: 09:00:00 via INTRAVENOUS

## 2018-12-10 MED ORDER — SODIUM CHLORIDE 0.9 % IV SOLN: INTRAVENOUS | Status: DC

## 2018-12-10 MED ORDER — LIDOCAINE 2% (20 MG/ML) 5 ML SYRINGE
INTRAMUSCULAR | Status: DC | PRN
  Administered 2018-12-10: 25 mg via INTRAVENOUS

## 2018-12-10 MED ORDER — IOPAMIDOL (ISOVUE-300) INJECTION 61%
INTRAVENOUS | Status: AC
  Filled 2018-12-10: qty 100

## 2018-12-10 MED ORDER — ACETAMINOPHEN 500 MG PO TABS
1000.0000 mg | ORAL_TABLET | Freq: Once | ORAL | Status: AC
  Administered 2018-12-10: 1000 mg via ORAL
  Filled 2018-12-10: qty 2

## 2018-12-10 MED ORDER — ASPIRIN EC 325 MG PO TBEC
325.0000 mg | DELAYED_RELEASE_TABLET | ORAL | Status: DC
  Filled 2018-12-10: qty 1

## 2018-12-10 MED ORDER — ONDANSETRON HCL 4 MG/2ML IJ SOLN
INTRAMUSCULAR | Status: DC | PRN
  Administered 2018-12-10: 4 mg via INTRAVENOUS

## 2018-12-10 MED ORDER — FENTANYL CITRATE (PF) 100 MCG/2ML IJ SOLN
INTRAMUSCULAR | Status: DC | PRN
  Administered 2018-12-10 (×2): 25 ug via INTRAVENOUS

## 2018-12-10 MED ORDER — LIDOCAINE HCL 1 % IJ SOLN
INTRAMUSCULAR | Status: AC
  Filled 2018-12-10: qty 20

## 2018-12-10 MED ORDER — CLOPIDOGREL BISULFATE 75 MG PO TABS
75.0000 mg | ORAL_TABLET | ORAL | Status: DC
  Filled 2018-12-10: qty 1

## 2018-12-10 MED ORDER — FENTANYL CITRATE (PF) 100 MCG/2ML IJ SOLN
INTRAMUSCULAR | Status: AC
  Filled 2018-12-10: qty 2

## 2018-12-10 MED ORDER — SODIUM CHLORIDE 0.9 % IV SOLN: INTRAVENOUS | Status: AC

## 2018-12-10 MED ORDER — LIDOCAINE HCL 1 % IJ SOLN
INTRAMUSCULAR | Status: DC | PRN
  Administered 2018-12-10: 10 mL

## 2018-12-10 MED ORDER — HEPARIN SODIUM (PORCINE) 1000 UNIT/ML IJ SOLN
INTRAMUSCULAR | Status: DC | PRN
  Administered 2018-12-10: 1000 [IU] via INTRAVENOUS

## 2018-12-10 MED ORDER — NIMODIPINE 30 MG PO CAPS
0.0000 mg | ORAL_CAPSULE | ORAL | Status: DC
  Filled 2018-12-10: qty 2

## 2018-12-10 MED ORDER — IOHEXOL 300 MG/ML  SOLN
150.0000 mL | Freq: Once | INTRAMUSCULAR | Status: AC | PRN
  Administered 2018-12-10: 85 mL via INTRA_ARTERIAL

## 2018-12-10 NOTE — Progress Notes (Signed)
No bleeding or hematoma noted after ambulation 

## 2018-12-10 NOTE — Telephone Encounter (Signed)
Left message on vm for pt's daughter, [name] (on dpr), to call back.

## 2018-12-10 NOTE — Sedation Documentation (Addendum)
Right groin arterial sheath removed. Manual pressure being held at right groin site. No closure device used.

## 2018-12-10 NOTE — H&P (Signed)
Chief Complaint: Patient was seen in consultation today for cerebral arteriogram with possible left internal carotic artery angioplasty/stnet at the request of Dr Jerel Shepherd   Supervising Physician: Julieanne Cotton  Patient Status: [facility] - Out-pt  History of Present Illness: [name] [name] is a 73 y.o. female   Pt with right sided numbness for months Worsening confusion Especially in last several weeks Forgetfulness. Known to NIR  L ICA angioplasty/stent 10/2008  Most recent imaging MR Brain and Cerebral angio 07/2018: IMPRESSION: Approximately 60-65% stenosis of the left internal carotid artery caval cavernous segment, with a 50% stenosis of the left internal carotid artery supraclinoid segment. Tandem stenosis of approximately 50% the left common carotid artery proximally and distally as described above. Approximately 50% stenosis of the right common carotid artery proximal to the right common carotid bifurcation. Approximately 50% diffuse stenosis of the right middle cerebral M1 segment.  Scheduled now for cerebral arteriogram with possible L ICA angioplasty/stent Plavix daily P2y12 164-- 186 today  Past Medical History:  Diagnosis Date  . Aneurysm (HCC)   . Anxiety   . Anxiety and depression   . Carotid stenosis    R 50% (12/2012)  . Concussion 08/03/2015  . COPD (chronic obstructive pulmonary disease) (HCC) 12/2012   spirometry: Pre: FVC 84%, FEV1 69%, ratio 0.64 consistent with moderate obstruction.  . Depression   . Fall 08/03/2015   d/c home health 08/2015  . Fracture of cervical vertebra, C5 (HCC) 08/06/2015  . History of chicken pox   . Hyperlipidemia   . Hypertension   . Lower back pain    h/o HNP s/p surgery  . Osteoarthritis    h/o ruptured disc s/p ESI  . Osteoporosis 11/2010   DEXA -2.7 spine, thoracic compression fracture  . Peripheral vascular disease (HCC)   . Smoker    quit 10/2012  . Stroke [facility]) 2010   x3 with residual R hemiparesis,  s/p R MCA balloon angioplasty (2010)    Past Surgical History:  Procedure Laterality Date  . APPENDECTOMY  1960  . CATARACT EXTRACTION     bilateral  . CESAREAN SECTION    . CHOLECYSTECTOMY  1970  . COLONOSCOPY  2004   diverticulosis, no polyps Jarold Motto)  . COLONOSCOPY  05/2016   decreased sphincter tone, diverticulosis, no f/u recommended (Danis)  . DEXA  11/2010   T -2.7 spine, -1.9 hip  . HIP SURGERY Left 2006   fractured - screws placed  . IR ANGIO INTRA EXTRACRAN SEL COM CAROTID INNOMINATE BILAT MOD SED  08/01/2018  . IR ANGIO VERTEBRAL SEL SUBCLAVIAN INNOMINATE UNI R MOD SED  08/01/2018  . IR RADIOLOGIST EVAL & MGMT  01/09/2018  . KYPHOPLASTY  10/02/2017   Procedure: LUMBAR FOUR KYPHOPLASTY;  Surgeon: Coletta Memos, MD;  Location: [facility] OR;  Service: Neurosurgery;;  . RADIOLOGY WITH ANESTHESIA N/A 11/11/2015   Procedure: RADIOLOGY WITH ANESTHESIA;  Surgeon: Julieanne Cotton, MD;  Location: [facility] OR;  Service: Radiology;  Laterality: N/A;    Allergies: Doxycycline  Medications: Prior to Admission medications   Medication Sig Start Date End Date Taking? Authorizing Provider  albuterol (PROVENTIL HFA;VENTOLIN HFA) 108 (90 Base) MCG/ACT inhaler Inhale 2 puffs into the lungs every 6 (six) hours as needed for wheezing or shortness of breath. 11/19/18  Yes [name], MD  aspirin EC 325 MG EC tablet Take 1 tablet (325 mg total) by mouth daily. 08/03/18  Yes Vann, Jessica U, DO  atorvastatin (LIPITOR) 40 MG tablet TAKE 1 TABLET (40  MG TOTAL) BY MOUTH DAILY. 12/28/17  Yes [name], MD  benazepril (LOTENSIN) 5 MG tablet Take 1 tablet (5 mg total) by mouth daily. 11/20/17  Yes [name], MD  CALCIUM-VITAMIN D PO Take 1 tablet by mouth daily.   Yes [provider]  Cholecalciferol (VITAMIN D3) 25 MCG (1000 UT) CAPS Take 1,000 Units by mouth daily.   Yes [provider]  clopidogrel (PLAVIX) 75 MG tablet TAKE 1 TABLET EVERY DAY Patient taking differently:  Take 75 mg by mouth daily.  05/24/18  Yes [name], MD  fexofenadine (ALLEGRA) 180 MG tablet Take 180 mg by mouth daily as needed for allergies.    Yes [provider]  fluticasone-salmeterol (ADVAIR HFA) 115-21 MCG/ACT inhaler Inhale 2 puffs into the lungs 2 (two) times daily. Rinse mouth after use. 11/21/17  Yes Shane Crutch, MD  furosemide (LASIX) 20 MG tablet Take 1 tablet (20 mg total) by mouth as needed for fluid or edema. May take once or twice a week for swelling Patient taking differently: Take 20 mg by mouth daily as needed for fluid or edema.  05/07/18  Yes [name], MD  levothyroxine (SYNTHROID, LEVOTHROID) 50 MCG tablet TAKE 1 TABLET  DAILY BEFORE BREAKFAST. 12/03/18  Yes [name], MD  Multiple Vitamins-Minerals (MULTIVITAMIN PO) Take 1 tablet by mouth daily.   Yes [provider]  OXYGEN Inhale 2 L into the lungs at bedtime.   Yes [provider]  Potassium Chloride ER 20 MEQ TBCR Take 20 mEq by mouth daily as needed (with Lasix).  07/02/18  Yes [name], MD  sertraline (ZOLOFT) 100 MG tablet Take 1 tablet (100 mg total) by mouth daily. 07/02/18  Yes [name], MD  traMADol (ULTRAM) 50 MG tablet Take 1-2 tablets (50-100 mg total) by mouth 3 (three) times daily as needed for moderate pain. 05/07/18  Yes [name], MD  traZODone (DESYREL) 100 MG tablet Take 1 tablet (100 mg total) by mouth at bedtime. Patient taking differently: Take 100 mg by mouth at bedtime as needed for sleep.  07/02/18  Yes [name], MD     Family History  Problem Relation Age of Onset  . CAD Mother        MI  . Cancer Mother        cervical  . CAD Maternal Aunt        MI  . CAD Sister        MI  . Sudden death Father 48       died in his sleep, chain smoker  . Cirrhosis Father   . Hypertension Daughter   . Diabetes Maternal Aunt   . Cancer Maternal Grandmother        cervical    Social History   Socioeconomic  History  . Marital status: Divorced    Spouse name: Not on file  . Number of children: Not on file  . Years of education: Not on file  . Highest education level: Not on file  Occupational History  . Not on file  Social Needs  . Financial resource strain: Not on file  . Food insecurity:    Worry: Not on file    Inability: Not on file  . Transportation needs:    Medical: Not on file    Non-medical: Not on file  Tobacco Use  . Smoking status: Former Smoker    Packs/day: 2.00    Years: 46.00    Pack years: 92.00  Types: Cigarettes    Last attempt to quit: 11/08/2012    Years since quitting: 6.0  . Smokeless tobacco: Never Used  Substance and Sexual Activity  . Alcohol use: No    Alcohol/week: 0.0 standard drinks  . Drug use: No  . Sexual activity: Not Currently  Lifestyle  . Physical activity:    Days per week: Not on file    Minutes per session: Not on file  . Stress: Not on file  Relationships  . Social connections:    Talks on phone: Not on file    Gets together: Not on file    Attends religious service: Not on file    Active member of club or organization: Not on file    Attends meetings of clubs or organizations: Not on file    Relationship status: Not on file  Other Topics Concern  . Not on file  Social History Narrative   Lives with granddaughter and great grandchildren   Occupation: retired, was Lawyer   Edu: 8th grade   Activity: no regular activity:   Diet: some water, fruits/vegetables daily     Review of Systems: A 12 point ROS discussed and pertinent positives are indicated in the HPI above.  All other systems are negative.  Review of Systems  Constitutional: Positive for activity change and fatigue. Negative for appetite change, fever and unexpected weight change.  HENT: Positive for tinnitus. Negative for sore throat and trouble swallowing.   Eyes: Negative for visual disturbance.  Respiratory: Negative for cough, shortness of breath and wheezing.    Cardiovascular: Negative for chest pain.  Gastrointestinal: Negative for abdominal pain.  Musculoskeletal: Positive for gait problem.  Neurological: Positive for dizziness, weakness, light-headedness, numbness and headaches. Negative for tremors and speech difficulty.  Psychiatric/Behavioral: Positive for confusion and decreased concentration. Negative for behavioral problems.    Vital Signs: BP (!) 112/46   Pulse 61   Temp 97.8 F (36.6 C) (Oral)   Resp 18   Ht 5' (1.524 m)   Wt 160 lb (72.6 kg)   SpO2 100%   BMI 31.25 kg/m   Physical Exam Vitals signs reviewed.  HENT:     Head: Atraumatic.     Mouth/Throat:     Mouth: Mucous membranes are moist.  Eyes:     Extraocular Movements: Extraocular movements intact.  Neck:     Musculoskeletal: Normal range of motion.  Cardiovascular:     Rate and Rhythm: Normal rate and regular rhythm.     Heart sounds: Normal heart sounds.  Pulmonary:     Effort: Pulmonary effort is normal. No respiratory distress.     Breath sounds: Normal breath sounds.  Abdominal:     Palpations: Abdomen is soft.     Tenderness: There is no abdominal tenderness.  Musculoskeletal: Normal range of motion.  Skin:    General: Skin is warm and dry.  Neurological:     Mental Status: She is alert and oriented to person, place, and time.  Psychiatric:        Mood and Affect: Mood normal.        Behavior: Behavior normal.     Comments: Consented with family at bedside     Imaging: No results found.  Labs:  CBC: Recent Labs    07/30/18 2223  08/02/18 0321 08/10/18 1933 08/10/18 1941 12/04/18 1520  WBC 5.9  --  5.7 7.5  --  6.5  HGB 12.9   < > 11.7* 12.3 12.9 12.3  HCT  42.1   < > 38.0 39.7 38.0 39.5  PLT 118*  --  125* 148*  --  158   < > = values in this interval not displayed.    COAGS: Recent Labs    07/30/18 2223 08/10/18 1933 12/04/18 1520  INR 1.02 1.00 1.0  APTT 27 32  --     BMP: Recent Labs    07/30/18 2223  08/02/18  0321 08/10/18 1933 08/10/18 1941 12/04/18 1520  NA 138   < > 139 139 140 142  K 3.9   < > 4.2 5.0 5.0 4.2  CL 107   < > 108 106 107 105  CO2 24  --  25 25  --  27  GLUCOSE 137*   < > 93 82 81 96  BUN 17   < > 24*  CALCIUM 8.9  --  8.6* 9.0  --  10.2  CREATININE 1.02*   < > 1.10* 1.06* 1.10* 1.04*  GFRNONAA 54*  --  49* 52*  --  54*  GFRAA >60  --  57* 60*  --  >60   < > = values in this interval not displayed.    LIVER FUNCTION TESTS: Recent Labs    01/30/18 1006 07/30/18 2223 08/10/18 1933  BILITOT 0.4 0.6 0.6  AST ALT ALKPHOS 100 106 96  PROT 7.6 6.9 6.8  ALBUMIN 4.1 3.6 3.6    TUMOR MARKERS: No results for input(s): AFPTM, CEA, CA199, CHROMGRNA in the last 8760 hours.  Assessment and Plan:  Known to NIR L ICA angioplasty stent placed 2010 Symptoms of Rt sided numbness; confusion B tinnitus Angio in 07/2018 reveals stenosis of L ICA; R CCA; and R MCA Plavix daily: 182 P2y12 today Dr Corliss Skains aware Scheduled for cerebral arteriogram with possible angioplasty stent placement Risks and benefits of cerebral angiogram with intervention were discussed with the patient including, but not limited to bleeding, infection, vascular injury, contrast induced renal failure, stroke or even death.  This interventional procedure involves the use of X-rays and because of the nature of the planned procedure, it is possible that we will have prolonged use of X-ray fluoroscopy.  Potential radiation risks to you include (but are not limited to) the following: - A slightly elevated risk for cancer  several years later in life. This risk is typically less than 0.5% percent. This risk is low in comparison to the normal incidence of human cancer, which is 33% for women and 50% for men according to the [employer] Society. - Radiation induced injury can include skin redness, resembling a rash, tissue breakdown / ulcers and hair loss (which can be  temporary or permanent).   The likelihood of either of these occurring depends on the difficulty of the procedure and whether you are sensitive to radiation due to previous procedures, disease, or genetic conditions.   IF your procedure requires a prolonged use of radiation, you will be notified and given written instructions for further action.  It is your responsibility to monitor the irradiated area for the 2 weeks following the procedure and to notify your physician if you are concerned that you have suffered a radiation induced injury.    All of the patient's and family's questions were answered, patient is agreeable to proceed. Consent signed and in chart.  Pt and family aware if intervention is performed- pt will be admitted overnight and planned for discharge in am. Agreeable  to proceed   Thank you for this interesting consult.  I greatly enjoyed meeting Haillie S Helt and look forward to participating in their care.  A copy of this report was sent to the requesting provider on this date.  Electronically Signed: Robet Leu, PA-C 12/10/2018, 8:06 AM   I spent a total of    40 Minutes in face to face in clinical consultation, greater than 50% of which was counseling/coordinating care for cerebral arteriogram with poss L ICA angioplasty stent placement

## 2018-12-10 NOTE — Procedures (Signed)
S/P 4 vessel cerebral arteriogram RT CFA approach. Findings. 1.Appprox 50 to 60 %stenosis of LT ICA caval cav seg. 2.Approx 70 % stenosis of RT MCA prox M 1 seg. 3.Approx 50 to 70 %  stenosis of RT ICA prox. S.Niambi Smoak MD

## 2018-12-10 NOTE — Discharge Instructions (Signed)
Femoral Site Care °This sheet gives you information about how to care for yourself after your procedure. Your health care provider may also give you more specific instructions. If you have problems or questions, contact your health care provider. °What can I expect after the procedure? °After the procedure, it is common to have: °· Bruising that usually fades within 1-2 weeks. °· Tenderness at the site. °Follow these instructions at home: °Wound care °· Follow instructions from your health care provider about how to take care of your insertion site. Make sure you: °? Wash your hands with soap and water before you change your bandage (dressing). If soap and water are not available, use hand sanitizer. °? Change your dressing as told by your health care provider. °? Leave stitches (sutures), skin glue, or adhesive strips in place. These skin closures may need to stay in place for 2 weeks or longer. If adhesive strip edges start to loosen and curl up, you may trim the loose edges. Do not remove adhesive strips completely unless your health care provider tells you to do that. °· Do not take baths, swim, or use a hot tub until your health care provider approves. °· You may shower 24-48 hours after the procedure or as told by your health care provider. °? Gently wash the site with plain soap and water. °? Pat the area dry with a clean towel. °? Do not rub the site. This may cause bleeding. °· Do not apply powder or lotion to the site. Keep the site clean and dry. °· Check your femoral site every day for signs of infection. Check for: °? Redness, swelling, or pain. °? Fluid or blood. °? Warmth. °? Pus or a bad smell. °Activity °· For the first 2-3 days after your procedure, or as long as directed: °? Avoid climbing stairs as much as possible. °? Do not squat. °· Do not lift anything that is heavier than 10 lb (4.5 kg), or the limit that you are told, until your health care provider says that it is safe. °· Rest as  directed. °? Avoid sitting for a long time without moving. Get up to take short walks every 1-2 hours. °· Do not drive for 24 hours if you were given a medicine to help you relax (sedative). °General instructions °· Take over-the-counter and prescription medicines only as told by your health care provider. °· Keep all follow-up visits as told by your health care provider. This is important. °Contact a health care provider if you have: °· A fever or chills. °· You have redness, swelling, or pain around your insertion site. °Get help right away if: °· The catheter insertion area swells very fast. °· You pass out. °· You suddenly start to sweat or your skin gets clammy. °· The catheter insertion area is bleeding, and the bleeding does not stop when you hold steady pressure on the area. °· The area near or just beyond the catheter insertion site becomes pale, cool, tingly, or numb. °These symptoms may represent a serious problem that is an emergency. Do not wait to see if the symptoms will go away. Get medical help right away. Call your local emergency services (911 in the [place] Do not drive yourself to the hospital. °Summary °· After the procedure, it is common to have bruising that usually fades within 1-2 weeks. °· Check your femoral site every day for signs of infection. °· Do not lift anything that is heavier than 10 lb (4.5 kg), or the   limit that you are told, until your health care provider says that it is safe. °This information is not intended to replace advice given to you by your health care provider. Make sure you discuss any questions you have with your health care provider. °Document Released: 05/16/2014 Document Revised: 09/25/2017 Document Reviewed: 09/25/2017 °Elsevier Interactive Patient Education © 2019 [employer] ° °

## 2018-12-10 NOTE — Transfer of Care (Signed)
Immediate Anesthesia Transfer of Care Note  Patient: [name] [name]  Procedure(s) Performed: STENTING (N/A )  Patient Location: PACU and Procedural SS 2  Anesthesia Type:MAC  Level of Consciousness: awake, alert , oriented, patient cooperative and responds to stimulation  Airway & Oxygen Therapy: Patient Spontanous Breathing  Post-op Assessment: Report given to RN and Post -op Vital signs reviewed and stable  Post vital signs: Reviewed and stable  Last Vitals:  Vitals Value Taken Time  BP    Temp    Pulse    Resp    SpO2      Last Pain:  Vitals:   12/10/18 0610  TempSrc:   PainSc: 0-No pain      Patients Stated Pain Goal: 4 (38/88/75 7972)  Complications: No apparent anesthesia complications

## 2018-12-10 NOTE — Telephone Encounter (Signed)
UA at hospital was more consistent with contamination than infection.  Is she still having confusion/imbalance issues? Any UTI symptoms?  If ongoing concern recommend she either come in to drop off urine or (preferabl) daughter pick up cup to take home for specimen collection then return for urine culture prior to treating.

## 2018-12-10 NOTE — Telephone Encounter (Signed)
Patient's daughter left a voicemail stating that her mom saw Dr. Corliss Skains at [facility] today and her urine showed trace for a UTI and they were told to call the office for her PCP to prescribe her medication. Patient's daughter stated that the doctor at [facility] said that Dr. Sharen Hones could see the results in the system. [facility]

## 2018-12-11 ENCOUNTER — Other Ambulatory Visit: Payer: Medicare HMO

## 2018-12-11 ENCOUNTER — Encounter (HOSPITAL_COMMUNITY): Payer: Self-pay | Admitting: Interventional Radiology

## 2018-12-11 DIAGNOSIS — R8290 Unspecified abnormal findings in urine: Secondary | ICD-10-CM | POA: Diagnosis not present

## 2018-12-11 DIAGNOSIS — R410 Disorientation, unspecified: Secondary | ICD-10-CM | POA: Diagnosis not present

## 2018-12-11 NOTE — Telephone Encounter (Signed)
Left message on vm for pt's daughter, Tonya (on dpr), to call back.  

## 2018-12-11 NOTE — Telephone Encounter (Signed)
Spoke with [name], patient's daughter. Patient has confusion and imbalance still. No burning or pain with urination. Some chills present and strong odor to the urine. No fever. Advised daughter of recommendations per Dr. Sharen Hones. Daughter will come by and pick up cup and supplies to collect urine. Order put in.

## 2018-12-11 NOTE — Addendum Note (Signed)
Addended by: Consuella Lose on: 12/11/2018 11:41 AM   Modules accepted: Orders

## 2018-12-11 NOTE — Anesthesia Postprocedure Evaluation (Signed)
Anesthesia Post Note  Patient: [name] [name]  Procedure(s) Performed: STENTING (N/A )     Patient location during evaluation: PACU Anesthesia Type: MAC Level of consciousness: awake and alert Pain management: pain level controlled Vital Signs Assessment: post-procedure vital signs reviewed and stable Respiratory status: spontaneous breathing, nonlabored ventilation, respiratory function stable and patient connected to nasal cannula oxygen Cardiovascular status: stable and blood pressure returned to baseline Postop Assessment: no apparent nausea or vomiting Anesthetic complications: no    Last Vitals:  Vitals:   12/10/18 0547  BP: (!) 112/46  Pulse: 61  Resp: 18  Temp: 36.6 C  SpO2: 100%    Last Pain:  Vitals:   12/10/18 0610  TempSrc:   PainSc: 0-No pain                 Sevan Mcbroom L Disha Cottam

## 2018-12-11 NOTE — Telephone Encounter (Signed)
Noted  

## 2018-12-12 LAB — URINE CULTURE
MICRO NUMBER:: 328438
Result:: NO GROWTH
SPECIMEN QUALITY:: ADEQUATE

## 2018-12-15 ENCOUNTER — Other Ambulatory Visit: Payer: Self-pay | Admitting: Family Medicine

## 2018-12-15 MED ORDER — ASPIRIN 81 MG PO TBEC: 81.0000 mg | DELAYED_RELEASE_TABLET | Freq: Every day | ORAL | Status: AC

## 2018-12-16 DIAGNOSIS — J449 Chronic obstructive pulmonary disease, unspecified: Secondary | ICD-10-CM | POA: Diagnosis not present

## 2018-12-16 DIAGNOSIS — R269 Unspecified abnormalities of gait and mobility: Secondary | ICD-10-CM | POA: Diagnosis not present

## 2018-12-21 ENCOUNTER — Telehealth: Payer: Self-pay

## 2018-12-21 NOTE — Telephone Encounter (Signed)
Left message on vm for pt's daughter, [name] [name] (on dpr), to call back.  Need to inform her Dr. G sent results to pt's MyChart on 12/13/18.  "[name] [name],  Your urine culture returned reassuringly normal without signs of infection at this time.  Dr Gutierrez"   

## 2018-12-21 NOTE — Telephone Encounter (Signed)
Received IBC from patient's daughter [name] inquiring about results of urine culture.   Please contact Tonya at [phone].

## 2018-12-24 NOTE — Telephone Encounter (Signed)
Left message on vm for pt's daughter, [name] (on dpr), to call back.  Need to inform her Dr. Reece Agar sent results to pt's MyChart on 12/13/18.  "Mrs Devaul,  Your urine culture returned reassuringly normal without signs of infection at this time.  Dr Sharen Hones"

## 2018-12-26 NOTE — Telephone Encounter (Signed)
Left message on vm for pt's daughter, [name] (on dpr), to call back. Mailing a letter.   Need to inform her Dr. Reece Agar sent results to pt's MyChart on 12/13/18.  "Mrs Kassis,  Your urine culture returned reassuringly normal without signs of infection at this time.  Dr Sharen Hones"

## 2018-12-28 ENCOUNTER — Other Ambulatory Visit: Payer: Self-pay

## 2018-12-28 NOTE — Telephone Encounter (Signed)
Plavix Last rx 05/24/18, #90 Last OV:  11/19/18, f/u Next OV:  01/01/19, ([facility]) 6 mo f/u

## 2018-12-31 MED ORDER — CLOPIDOGREL BISULFATE 75 MG PO TABS: 75.0000 mg | ORAL_TABLET | Freq: Every day | ORAL | 1 refills | Status: DC

## 2019-01-01 ENCOUNTER — Other Ambulatory Visit: Payer: Self-pay

## 2019-01-01 ENCOUNTER — Ambulatory Visit: Payer: Medicare HMO | Admitting: Family Medicine

## 2019-01-01 ENCOUNTER — Encounter: Payer: Self-pay | Admitting: Family Medicine

## 2019-01-01 ENCOUNTER — Ambulatory Visit (INDEPENDENT_AMBULATORY_CARE_PROVIDER_SITE_OTHER): Payer: Medicare HMO | Admitting: Family Medicine

## 2019-01-01 VITALS — BP 122/72 | HR 101 | Temp 97.5°F | Ht 61.5 in | Wt 161.0 lb

## 2019-01-01 DIAGNOSIS — H9313 Tinnitus, bilateral: Secondary | ICD-10-CM | POA: Diagnosis not present

## 2019-01-01 DIAGNOSIS — I10 Essential (primary) hypertension: Secondary | ICD-10-CM

## 2019-01-01 DIAGNOSIS — I6523 Occlusion and stenosis of bilateral carotid arteries: Secondary | ICD-10-CM

## 2019-01-01 DIAGNOSIS — G4700 Insomnia, unspecified: Secondary | ICD-10-CM

## 2019-01-01 DIAGNOSIS — F331 Major depressive disorder, recurrent, moderate: Secondary | ICD-10-CM

## 2019-01-01 MED ORDER — ACETAMINOPHEN 500 MG PO TABS: 500.0000 mg | ORAL_TABLET | Freq: Four times a day (QID) | ORAL | Status: AC | PRN

## 2019-01-01 NOTE — Assessment & Plan Note (Signed)
Chronic, stable on low dose benazepril 5mg .

## 2019-01-01 NOTE — Assessment & Plan Note (Addendum)
Feels trazodone 100mg  nightly is working ok. Not interested in decreasing dose.

## 2019-01-01 NOTE — Assessment & Plan Note (Signed)
Stable period on sertraline 100mg daily - continue.  

## 2019-01-01 NOTE — Assessment & Plan Note (Signed)
Medical management followed by IR. Latest imaging 11/2018 showing stable intracranial blockages.

## 2019-01-01 NOTE — Progress Notes (Signed)
Virtual visit attempted through WebEx. Able to connect video but audio did not work for her - so audio completed using telephone.  Patient location: home, also present is daughter [name] Provider location: Adult nurse at [facility], office  If any vitals were documented below, they were collected by patient at home unless specified below.   BP 122/72 (BP Location: Right Arm, Patient Position: Sitting, Cuff Size: Normal)   Pulse (!) 101   Temp (!) 97.5 F (36.4 C) (Oral)   Ht 5' 1.5" (1.562 m)   Wt 161 lb (73 kg)   BMI 29.93 kg/m    CC: 6 mo f/u visit Subjective:    Patient ID: [name] [name], female    DOB: 11/17/1945, 73 y.o.   MRN: [id number]  HPI: [name] [name] is a 73 y.o. female presenting on 01/01/2019 for Follow-up (6 mo f/u.)   Recent IR eval 3/17, note reviewed. Stable 70% stenosis of R MCA, 50-70% stenosis if RICA, 60-65% stenosis of LICA. Planned continued medical management with dual antiplatelet therapy (plavix and aspirin 81mg ).   Seen here 10/2018 with tinnitus, imbalance, confusion. UA/micro and UCx negative for infection twice in the past 2 months. Ringing is improving with lower aspirin dose.  Overall feeling well. She feels she is sleeping well. Tries to stay active around the house, but daughter doesn't feel she is as active as she could be.      Relevant past medical, surgical, family and social history reviewed and updated as indicated. Interim medical history since our last visit reviewed. Allergies and medications reviewed and updated. Outpatient Medications Prior to Visit  Medication Sig Dispense Refill  . albuterol (PROVENTIL HFA;VENTOLIN HFA) 108 (90 Base) MCG/ACT inhaler Inhale 2 puffs into the lungs every 6 (six) hours as needed for wheezing or shortness of breath. 1 Inhaler 2  . aspirin (ASPIRIN 81) 81 MG EC tablet Take 1 tablet (81 mg total) by mouth daily. Swallow whole.    [name] atorvastatin (LIPITOR) 40 MG tablet TAKE 1 TABLET (40 MG TOTAL) BY  MOUTH DAILY. 90 tablet 2  . benazepril (LOTENSIN) 5 MG tablet Take 1 tablet (5 mg total) by mouth daily. 30 tablet 3  . CALCIUM-VITAMIN D PO Take 1 tablet by mouth daily.    . Cholecalciferol (VITAMIN D3) 25 MCG (1000 UT) CAPS Take 1,000 Units by mouth daily.    . clopidogrel (PLAVIX) 75 MG tablet Take 1 tablet (75 mg total) by mouth daily. 90 tablet 1  . fexofenadine (ALLEGRA) 180 MG tablet Take 180 mg by mouth daily as needed for allergies.     . fluticasone-salmeterol (ADVAIR HFA) 115-21 MCG/ACT inhaler Inhale 2 puffs into the lungs 2 (two) times daily. Rinse mouth after use. 1 Inhaler 12  . furosemide (LASIX) 20 MG tablet Take 1 tablet (20 mg total) by mouth as needed for fluid or edema. May take once or twice a week for swelling (Patient taking differently: Take 20 mg by mouth daily as needed for fluid or edema. ) 40 tablet 1  . levothyroxine (SYNTHROID, LEVOTHROID) 50 MCG tablet TAKE 1 TABLET  DAILY BEFORE BREAKFAST. 90 tablet 0  . Multiple Vitamins-Minerals (MULTIVITAMIN PO) Take 1 tablet by mouth daily.    . OXYGEN Inhale 2 L into the lungs at bedtime.    . Potassium Chloride ER 20 MEQ TBCR Take 20 mEq by mouth daily as needed (with Lasix).     [name] sertraline (ZOLOFT) 100 MG tablet Take 1 tablet (100 mg total) by  mouth daily. 90 tablet 3  . traMADol (ULTRAM) 50 MG tablet Take 1-2 tablets (50-100 mg total) by mouth 3 (three) times daily as needed for moderate pain. 30 tablet 0  . traZODone (DESYREL) 100 MG tablet Take 1 tablet (100 mg total) by mouth at bedtime. (Patient taking differently: Take 100 mg by mouth at bedtime as needed for sleep. ) 90 tablet 3   No facility-administered medications prior to visit.      Per HPI unless specifically indicated in ROS section below Review of Systems Objective:    BP 122/72 (BP Location: Right Arm, Patient Position: Sitting, Cuff Size: Normal)   Pulse (!) 101   Temp (!) 97.5 F (36.4 C) (Oral)   Ht 5' 1.5" (1.562 m)   Wt 161 lb (73 kg)   BMI  29.93 kg/m   Wt Readings from Last 3 Encounters:  01/01/19 161 lb (73 kg)  12/10/18 160 lb (72.6 kg)  12/04/18 160 lb 11.2 oz (72.9 kg)     Physical exam: Gen: alert, NAD, not ill appearing Pulm: speaks in complete sentences without increased work of breathing Psych: normal mood, normal thought content      Results for orders placed or performed in visit on 12/11/18  Urine Culture  Result Value Ref Range   MICRO NUMBER: [id number]    SPECIMEN QUALITY: Adequate    Sample Source NOT GIVEN    STATUS: FINAL    Result: No Growth    Lab Results  Component Value Date   TSH 2.64 06/27/2018    Assessment & Plan:   Problem List Items Addressed This Visit    Tinnitus of both ears    Improving on lower dose aspirin.  She has also minimized NSAIDs.      MDD (major depressive disorder), recurrent episode, moderate (HCC)    Stable period on sertraline 100mg  daily - continue.       Insomnia    Feels trazodone 100mg  nightly is working ok. Not interested in decreasing dose.       Hypertension    Chronic, stable on low dose benazepril 5mg .      Carotid stenosis - Primary    Medical management followed by IR. Latest imaging 11/2018 showing stable intracranial blockages.           Meds ordered this encounter  Medications  . acetaminophen (TYLENOL) 500 MG tablet    Sig: Take 1 tablet (500 mg total) by mouth every 6 (six) hours as needed for headache.   No orders of the defined types were placed in this encounter.   Follow up plan: No follow-ups on file.  [name] Boyden, MD

## 2019-01-01 NOTE — Assessment & Plan Note (Addendum)
Improving on lower dose aspirin.  She has also minimized NSAIDs.

## 2019-01-16 DIAGNOSIS — J449 Chronic obstructive pulmonary disease, unspecified: Secondary | ICD-10-CM | POA: Diagnosis not present

## 2019-01-16 DIAGNOSIS — R269 Unspecified abnormalities of gait and mobility: Secondary | ICD-10-CM | POA: Diagnosis not present

## 2019-01-24 ENCOUNTER — Emergency Department (HOSPITAL_COMMUNITY): Payer: Medicare HMO

## 2019-01-24 ENCOUNTER — Other Ambulatory Visit: Payer: Self-pay

## 2019-01-24 ENCOUNTER — Observation Stay (HOSPITAL_COMMUNITY)
Admission: EM | Admit: 2019-01-24 | Discharge: 2019-01-25 | Disposition: A | Discharge status: Home / Self Care | Payer: Medicare HMO | Attending: Internal Medicine | Admitting: Internal Medicine

## 2019-01-24 ENCOUNTER — Telehealth: Payer: Self-pay

## 2019-01-24 ENCOUNTER — Encounter (HOSPITAL_COMMUNITY): Payer: Self-pay | Admitting: Emergency Medicine

## 2019-01-24 DIAGNOSIS — Z7902 Long term (current) use of antithrombotics/antiplatelets: Secondary | ICD-10-CM | POA: Insufficient documentation

## 2019-01-24 DIAGNOSIS — N183 Chronic kidney disease, stage 3 unspecified: Secondary | ICD-10-CM | POA: Diagnosis present

## 2019-01-24 DIAGNOSIS — Z79899 Other long term (current) drug therapy: Secondary | ICD-10-CM | POA: Insufficient documentation

## 2019-01-24 DIAGNOSIS — J449 Chronic obstructive pulmonary disease, unspecified: Secondary | ICD-10-CM | POA: Insufficient documentation

## 2019-01-24 DIAGNOSIS — R072 Precordial pain: Secondary | ICD-10-CM | POA: Diagnosis not present

## 2019-01-24 DIAGNOSIS — F331 Major depressive disorder, recurrent, moderate: Secondary | ICD-10-CM | POA: Insufficient documentation

## 2019-01-24 DIAGNOSIS — M549 Dorsalgia, unspecified: Secondary | ICD-10-CM

## 2019-01-24 DIAGNOSIS — M1990 Unspecified osteoarthritis, unspecified site: Secondary | ICD-10-CM | POA: Insufficient documentation

## 2019-01-24 DIAGNOSIS — Z8249 Family history of ischemic heart disease and other diseases of the circulatory system: Secondary | ICD-10-CM | POA: Diagnosis not present

## 2019-01-24 DIAGNOSIS — M954 Acquired deformity of chest and rib: Secondary | ICD-10-CM | POA: Insufficient documentation

## 2019-01-24 DIAGNOSIS — Z7982 Long term (current) use of aspirin: Secondary | ICD-10-CM | POA: Diagnosis not present

## 2019-01-24 DIAGNOSIS — Z79890 Hormone replacement therapy (postmenopausal): Secondary | ICD-10-CM | POA: Diagnosis not present

## 2019-01-24 DIAGNOSIS — F419 Anxiety disorder, unspecified: Secondary | ICD-10-CM | POA: Diagnosis not present

## 2019-01-24 DIAGNOSIS — Z87891 Personal history of nicotine dependence: Secondary | ICD-10-CM | POA: Insufficient documentation

## 2019-01-24 DIAGNOSIS — I69813 Psychomotor deficit following other cerebrovascular disease: Secondary | ICD-10-CM | POA: Diagnosis not present

## 2019-01-24 DIAGNOSIS — I6529 Occlusion and stenosis of unspecified carotid artery: Secondary | ICD-10-CM | POA: Insufficient documentation

## 2019-01-24 DIAGNOSIS — E559 Vitamin D deficiency, unspecified: Secondary | ICD-10-CM | POA: Insufficient documentation

## 2019-01-24 DIAGNOSIS — Z7951 Long term (current) use of inhaled steroids: Secondary | ICD-10-CM | POA: Insufficient documentation

## 2019-01-24 DIAGNOSIS — I69351 Hemiplegia and hemiparesis following cerebral infarction affecting right dominant side: Secondary | ICD-10-CM | POA: Diagnosis not present

## 2019-01-24 DIAGNOSIS — J432 Centrilobular emphysema: Secondary | ICD-10-CM

## 2019-01-24 DIAGNOSIS — M810 Age-related osteoporosis without current pathological fracture: Secondary | ICD-10-CM | POA: Insufficient documentation

## 2019-01-24 DIAGNOSIS — I739 Peripheral vascular disease, unspecified: Secondary | ICD-10-CM | POA: Diagnosis not present

## 2019-01-24 DIAGNOSIS — E039 Hypothyroidism, unspecified: Secondary | ICD-10-CM | POA: Insufficient documentation

## 2019-01-24 DIAGNOSIS — I130 Hypertensive heart and chronic kidney disease with heart failure and stage 1 through stage 4 chronic kidney disease, or unspecified chronic kidney disease: Secondary | ICD-10-CM | POA: Insufficient documentation

## 2019-01-24 DIAGNOSIS — R079 Chest pain, unspecified: Secondary | ICD-10-CM | POA: Diagnosis not present

## 2019-01-24 DIAGNOSIS — J9611 Chronic respiratory failure with hypoxia: Secondary | ICD-10-CM | POA: Insufficient documentation

## 2019-01-24 DIAGNOSIS — R51 Headache: Secondary | ICD-10-CM | POA: Diagnosis not present

## 2019-01-24 DIAGNOSIS — Z8673 Personal history of transient ischemic attack (TIA), and cerebral infarction without residual deficits: Secondary | ICD-10-CM | POA: Diagnosis not present

## 2019-01-24 DIAGNOSIS — A498 Other bacterial infections of unspecified site: Secondary | ICD-10-CM | POA: Insufficient documentation

## 2019-01-24 DIAGNOSIS — G8929 Other chronic pain: Secondary | ICD-10-CM

## 2019-01-24 DIAGNOSIS — I10 Essential (primary) hypertension: Secondary | ICD-10-CM | POA: Diagnosis present

## 2019-01-24 DIAGNOSIS — I5032 Chronic diastolic (congestive) heart failure: Secondary | ICD-10-CM | POA: Insufficient documentation

## 2019-01-24 DIAGNOSIS — N390 Urinary tract infection, site not specified: Secondary | ICD-10-CM

## 2019-01-24 DIAGNOSIS — E785 Hyperlipidemia, unspecified: Secondary | ICD-10-CM | POA: Diagnosis not present

## 2019-01-24 DIAGNOSIS — Z9981 Dependence on supplemental oxygen: Secondary | ICD-10-CM | POA: Insufficient documentation

## 2019-01-24 LAB — URINALYSIS, ROUTINE W REFLEX MICROSCOPIC
Bilirubin Urine: NEGATIVE
Glucose, UA: NEGATIVE mg/dL
Hgb urine dipstick: NEGATIVE
Ketones, ur: NEGATIVE mg/dL
Nitrite: NEGATIVE
Protein, ur: NEGATIVE mg/dL
Specific Gravity, Urine: 1.015 (ref 1.005–1.030)
pH: 5 (ref 5.0–8.0)

## 2019-01-24 LAB — CBC
HCT: 41.6 % (ref 36.0–46.0)
Hemoglobin: 13.3 g/dL (ref 12.0–15.0)
MCH: 30.8 pg (ref 26.0–34.0)
MCHC: 32 g/dL (ref 30.0–36.0)
MCV: 96.3 fL (ref 80.0–100.0)
Platelets: 171 10*3/uL (ref 150–400)
RBC: 4.32 MIL/uL (ref 3.87–5.11)
RDW: 12.7 % (ref 11.5–15.5)
WBC: 8.2 10*3/uL (ref 4.0–10.5)
nRBC: 0 % (ref 0.0–0.2)

## 2019-01-24 LAB — BASIC METABOLIC PANEL
Anion gap: 12 (ref 5–15)
BUN: 22 mg/dL (ref 8–23)
CO2: 25 mmol/L (ref 22–32)
Calcium: 10 mg/dL (ref 8.9–10.3)
Chloride: 103 mmol/L (ref 98–111)
Creatinine, Ser: 1.14 mg/dL — ABNORMAL HIGH (ref 0.44–1.00)
GFR calc Af Amer: 56 mL/min — ABNORMAL LOW (ref >60)
GFR calc non Af Amer: 48 mL/min — ABNORMAL LOW (ref >60)
Glucose, Bld: 117 mg/dL — ABNORMAL HIGH (ref 70–99)
Potassium: 4.3 mmol/L (ref 3.5–5.1)
Sodium: 140 mmol/L (ref 135–145)

## 2019-01-24 LAB — HEPATIC FUNCTION PANEL
ALT: 25 U/L (ref 0–44)
AST: 28 U/L (ref 15–41)
Albumin: 4 g/dL (ref 3.5–5.0)
Alkaline Phosphatase: 103 U/L (ref 38–126)
Bilirubin, Direct: <0.1 mg/dL (ref 0.0–0.2)
Total Bilirubin: 0.4 mg/dL (ref 0.3–1.2)
Total Protein: 7.7 g/dL (ref 6.5–8.1)

## 2019-01-24 LAB — TROPONIN I
Troponin I: <0.03 ng/mL (ref <0.03)
Troponin I: <0.03 ng/mL (ref <0.03)

## 2019-01-24 LAB — BRAIN NATRIURETIC PEPTIDE: B Natriuretic Peptide: 49.9 pg/mL (ref 0.0–100.0)

## 2019-01-24 MED ORDER — ENOXAPARIN SODIUM 40 MG/0.4ML ~~LOC~~ SOLN
40.0000 mg | SUBCUTANEOUS | Status: DC
  Administered 2019-01-24: 40 mg via SUBCUTANEOUS
  Filled 2019-01-24: qty 0.4

## 2019-01-24 MED ORDER — SERTRALINE HCL 100 MG PO TABS
100.0000 mg | ORAL_TABLET | Freq: Every day | ORAL | Status: DC
  Administered 2019-01-25: 100 mg via ORAL
  Filled 2019-01-24: qty 1

## 2019-01-24 MED ORDER — SODIUM CHLORIDE 0.9 % IV SOLN
1.0000 g | Freq: Once | INTRAVENOUS | Status: AC
  Administered 2019-01-24: 1 g via INTRAVENOUS
  Filled 2019-01-24: qty 10

## 2019-01-24 MED ORDER — ONDANSETRON HCL 4 MG/2ML IJ SOLN
4.0000 mg | Freq: Four times a day (QID) | INTRAMUSCULAR | Status: DC | PRN
  Administered 2019-01-24: 21:00:00 4 mg via INTRAVENOUS
  Filled 2019-01-24: qty 2

## 2019-01-24 MED ORDER — MOMETASONE FURO-FORMOTEROL FUM 200-5 MCG/ACT IN AERO
2.0000 | INHALATION_SPRAY | Freq: Two times a day (BID) | RESPIRATORY_TRACT | Status: DC
  Administered 2019-01-24 – 2019-01-25 (×2): 2 via RESPIRATORY_TRACT
  Filled 2019-01-24: qty 8.8

## 2019-01-24 MED ORDER — ALBUTEROL SULFATE HFA 108 (90 BASE) MCG/ACT IN AERS: 2.0000 | INHALATION_SPRAY | Freq: Four times a day (QID) | RESPIRATORY_TRACT | Status: DC | PRN

## 2019-01-24 MED ORDER — SODIUM CHLORIDE 0.9% FLUSH
3.0000 mL | Freq: Once | INTRAVENOUS | Status: AC
  Administered 2019-01-24: 3 mL via INTRAVENOUS

## 2019-01-24 MED ORDER — ACETAMINOPHEN 325 MG PO TABS
650.0000 mg | ORAL_TABLET | ORAL | Status: DC | PRN
  Administered 2019-01-25: 650 mg via ORAL
  Filled 2019-01-24: qty 2

## 2019-01-24 MED ORDER — ALBUTEROL SULFATE (2.5 MG/3ML) 0.083% IN NEBU: 2.5000 mg | INHALATION_SOLUTION | Freq: Four times a day (QID) | RESPIRATORY_TRACT | Status: DC | PRN

## 2019-01-24 MED ORDER — LEVOTHYROXINE SODIUM 50 MCG PO TABS
50.0000 ug | ORAL_TABLET | Freq: Every day | ORAL | Status: DC
  Administered 2019-01-25: 50 ug via ORAL
  Filled 2019-01-24: qty 1

## 2019-01-24 MED ORDER — ASPIRIN 81 MG PO CHEW
324.0000 mg | CHEWABLE_TABLET | Freq: Once | ORAL | Status: AC
  Administered 2019-01-24: 18:00:00 324 mg via ORAL
  Filled 2019-01-24: qty 4

## 2019-01-24 MED ORDER — SODIUM CHLORIDE 0.9% FLUSH: 3.0000 mL | INTRAVENOUS | Status: DC | PRN

## 2019-01-24 MED ORDER — BENAZEPRIL HCL 5 MG PO TABS
5.0000 mg | ORAL_TABLET | Freq: Every day | ORAL | Status: DC
  Administered 2019-01-25: 5 mg via ORAL
  Filled 2019-01-24: qty 1

## 2019-01-24 MED ORDER — SODIUM CHLORIDE 0.9 % IV SOLN: 250.0000 mL | INTRAVENOUS | Status: DC | PRN

## 2019-01-24 MED ORDER — SODIUM CHLORIDE 0.9% FLUSH
3.0000 mL | Freq: Two times a day (BID) | INTRAVENOUS | Status: DC
  Administered 2019-01-24 – 2019-01-25 (×2): 3 mL via INTRAVENOUS

## 2019-01-24 MED ORDER — ATORVASTATIN CALCIUM 40 MG PO TABS: 40.0000 mg | ORAL_TABLET | Freq: Every day | ORAL | Status: DC

## 2019-01-24 MED ORDER — ASPIRIN 81 MG PO CHEW
81.0000 mg | CHEWABLE_TABLET | Freq: Every day | ORAL | Status: DC
  Administered 2019-01-25: 81 mg via ORAL
  Filled 2019-01-24: qty 1

## 2019-01-24 MED ORDER — TRAMADOL HCL 50 MG PO TABS: 50.0000 mg | ORAL_TABLET | Freq: Three times a day (TID) | ORAL | Status: DC | PRN

## 2019-01-24 MED ORDER — TRAZODONE HCL 100 MG PO TABS: 100.0000 mg | ORAL_TABLET | Freq: Every evening | ORAL | Status: DC | PRN

## 2019-01-24 MED ORDER — NITROGLYCERIN 0.4 MG SL SUBL
0.4000 mg | SUBLINGUAL_TABLET | SUBLINGUAL | Status: DC | PRN
  Administered 2019-01-24: 0.4 mg via SUBLINGUAL
  Filled 2019-01-24: qty 1

## 2019-01-24 MED ORDER — CLOPIDOGREL BISULFATE 75 MG PO TABS
75.0000 mg | ORAL_TABLET | Freq: Every day | ORAL | Status: DC
  Administered 2019-01-25: 75 mg via ORAL
  Filled 2019-01-24: qty 1

## 2019-01-24 NOTE — Telephone Encounter (Signed)
Spoke with Dr. Para March as Dr. Reece Agar is out of the office this afternoon.  Dr. Para March agrees that given patient's symptoms with CP building and at an 8 this am, do recommend immediate eval at hospital.   Patient requests that I run this by Dr. Reece Agar given her nervous state over COVID and I was able to discuss with him over the phone.  Dr Reece Agar is in complete agreement given patient's current symptoms and medical history.  Patient agrees to go to [facility] ER for further eval and workup.

## 2019-01-24 NOTE — ED Notes (Signed)
Doneisha Montello (daughter)- [phone]

## 2019-01-24 NOTE — ED Provider Notes (Signed)
[facility] EMERGENCY DEPARTMENT Provider Note   CSN: [id number] Arrival date & time: 01/24/19  1502    History   Chief Complaint Chief Complaint  Patient presents with   Chest Pain    HPI ELESIA PEMBERTON is a 73 y.o. female.     HPI 73yo female with history of COPD, PVD, CVA, hyperlipidemia presents with concern for chest pain.  Nausea for 3 days  Chest pain Blood pressures high Daughter called her physician who recommended she come to the ED Headaches, began for 2 weeks, no falls or head trauma. No numbness/weakness/diff talking or walking.  Hx of strokes in the past  Chest pain for 3 day Hurts on the left side, like a hard pain, not a stabbing pain, is a pressure Sometimes radiates to left shoulder Feels associated nausea with CP and dyspnea Takes tylenol which helps CP some, sometimes it doesn't CP spontaneous, random Not really having cough. Occ will but not really.  No fevers, no sore throat or congestion  CP currently 7-8/10, when episodes pressure that come and go but then resolve No hx of CP Hx of CHF, has leg swelling, takes lasix No hx of known CAD, had seen Cardiologist at [facility] but was a long time ago, no recent stress tests.   Past Medical History:  Diagnosis Date   Aneurysm (HCC)    Anxiety    Anxiety and depression    Carotid stenosis    R 50% (12/2012)   Concussion 08/03/2015   COPD (chronic obstructive pulmonary disease) (HCC) 12/2012   spirometry: Pre: FVC 84%, FEV1 69%, ratio 0.64 consistent with moderate obstruction.   Depression    Fall 08/03/2015   d/c home health 08/2015   Fracture of cervical vertebra, C5 (HCC) 08/06/2015   History of chicken pox    Hyperlipidemia    Hypertension    Lower back pain    h/o HNP s/p surgery   Osteoarthritis    h/o ruptured disc s/p ESI   Osteoporosis 11/2010   DEXA -2.7 spine, thoracic compression fracture   Peripheral vascular disease (HCC)    Smoker    quit  10/2012   Stroke [facility]) 2010   x3 with residual R hemiparesis, s/p R MCA balloon angioplasty (2010)    Patient Active Problem List   Diagnosis Date Noted   Chest pain 01/24/2019   CKD (chronic kidney disease) stage 3, GFR 30-59 ml/min (HCC) 01/24/2019   Tinnitus of both ears 11/24/2018   Vision loss of left eye 07/31/2018   Thrombocytopenia (HCC) 07/31/2018   Stroke-like symptoms 07/31/2018   Numbness    Tremor 07/03/2018   Weakness 05/07/2018   Chronic diastolic heart failure (HCC) 01/30/2018   Hyperkalemia 01/30/2018   Memory loss 01/30/2018   Peripheral edema 01/30/2018   Intertrigo 01/30/2018   Chronic back pain 11/21/2017   Chronic respiratory failure with hypoxia (HCC) 11/14/2017   SOB (shortness of breath) 11/14/2017   Closed compression fracture of fourth lumbar vertebra (HCC) 10/02/2017   Compression fracture of body of thoracic vertebra (HCC) 09/30/2017   Hypothyroidism (acquired) 06/28/2017   Vitamin D deficiency 06/25/2017   Altered mental status 08/01/2015   Advanced care planning/counseling discussion 01/29/2015   Encounter for general adult medical examination with abnormal findings 01/29/2015   Carotid stenosis 04/03/2014   Carotid artery aneurysm (HCC) 04/03/2014   Recurrent falls 01/02/2014   Hemiparesis affecting right side as late effect of stroke (HCC) 01/02/2014   Insomnia 01/10/2013   Medicare annual  wellness visit, subsequent 11/26/2012   Hypertension    History of lacunar cerebrovascular accident (CVA)    Peripheral vascular disease (HCC)    COPD (chronic obstructive pulmonary disease) (HCC)    Hyperlipidemia    Osteoarthritis    MDD (major depressive disorder), recurrent episode, moderate (HCC)    Ex-smoker    Osteoporosis 11/25/2010    Past Surgical History:  Procedure Laterality Date   APPENDECTOMY  1960   CATARACT EXTRACTION     bilateral   CESAREAN SECTION     CHOLECYSTECTOMY  1970    COLONOSCOPY  2004   diverticulosis, no polyps Jarold Motto)   COLONOSCOPY  05/2016   decreased sphincter tone, diverticulosis, no f/u recommended (Danis)   DEXA  11/2010   T -2.7 spine, -1.9 hip   HIP SURGERY Left 2006   fractured - screws placed   IR ANGIO INTRA EXTRACRAN SEL COM CAROTID INNOMINATE BILAT MOD SED  08/01/2018   IR ANGIO INTRA EXTRACRAN SEL COM CAROTID INNOMINATE BILAT MOD SED  12/10/2018   IR ANGIO VERTEBRAL SEL SUBCLAVIAN INNOMINATE UNI R MOD SED  08/01/2018   IR ANGIO VERTEBRAL SEL SUBCLAVIAN INNOMINATE UNI R MOD SED  12/10/2018   IR ANGIO VERTEBRAL SEL VERTEBRAL UNI L MOD SED  12/10/2018   IR RADIOLOGIST EVAL & MGMT  01/09/2018   KYPHOPLASTY  10/02/2017   Procedure: LUMBAR FOUR KYPHOPLASTY;  Surgeon: Coletta Memos, MD;  Location: [facility] OR;  Service: Neurosurgery;;   RADIOLOGY WITH ANESTHESIA N/A 11/11/2015   Procedure: RADIOLOGY WITH ANESTHESIA;  Surgeon: Julieanne Cotton, MD;  Location: [facility] OR;  Service: Radiology;  Laterality: N/A;   RADIOLOGY WITH ANESTHESIA N/A 12/10/2018   Procedure: STENTING;  Surgeon: Julieanne Cotton, MD;  Location: [facility] OR;  Service: Radiology;  Laterality: N/A;     OB History   No obstetric history on file.      Home Medications    Prior to Admission medications   Medication Sig Start Date End Date Taking? Authorizing Provider  acetaminophen (TYLENOL) 500 MG tablet Take 1 tablet (500 mg total) by mouth every 6 (six) hours as needed for headache. 01/01/19  Yes [name], MD  albuterol (PROVENTIL HFA;VENTOLIN HFA) 108 (90 Base) MCG/ACT inhaler Inhale 2 puffs into the lungs every 6 (six) hours as needed for wheezing or shortness of breath. 11/19/18  Yes [name], MD  aspirin (ASPIRIN 81) 81 MG EC tablet Take 1 tablet (81 mg total) by mouth daily. Swallow whole. 12/15/18  Yes [name], MD  atorvastatin (LIPITOR) 40 MG tablet TAKE 1 TABLET (40 MG TOTAL) BY MOUTH DAILY. 12/28/17  Yes [name], MD  benazepril  (LOTENSIN) 5 MG tablet Take 1 tablet (5 mg total) by mouth daily. 11/20/17  Yes [name], MD  CALCIUM-VITAMIN D PO Take 1 tablet by mouth daily.   Yes [provider]  Cholecalciferol (VITAMIN D3) 25 MCG (1000 UT) CAPS Take 1,000 Units by mouth daily.   Yes [provider]  clopidogrel (PLAVIX) 75 MG tablet Take 1 tablet (75 mg total) by mouth daily. 12/31/18  Yes [name], MD  fexofenadine (ALLEGRA) 180 MG tablet Take 180 mg by mouth daily as needed for allergies.    Yes [provider]  fluticasone-salmeterol (ADVAIR HFA) 115-21 MCG/ACT inhaler Inhale 2 puffs into the lungs 2 (two) times daily. Rinse mouth after use. 11/21/17  Yes Shane Crutch, MD  furosemide (LASIX) 20 MG tablet Take 1 tablet (20 mg total) by mouth as needed for fluid or edema. May  take once or twice a week for swelling Patient taking differently: Take 20 mg by mouth daily as needed for fluid or edema.  05/07/18  Yes Eustaquio BoydenGutierrez, Javier, MD  levothyroxine (SYNTHROID, LEVOTHROID) 50 MCG tablet TAKE 1 TABLET  DAILY BEFORE BREAKFAST. Patient taking differently: Take 50 mcg by mouth daily before breakfast.  12/03/18  Yes Eustaquio BoydenGutierrez, Javier, MD  Multiple Vitamins-Minerals (MULTIVITAMIN PO) Take 1 tablet by mouth daily.   Yes [provider]  Potassium Chloride ER 20 MEQ TBCR Take 20 mEq by mouth daily as needed (with Lasix).  07/02/18  Yes Eustaquio BoydenGutierrez, Javier, MD  sertraline (ZOLOFT) 100 MG tablet Take 1 tablet (100 mg total) by mouth daily. 07/02/18  Yes Eustaquio BoydenGutierrez, Javier, MD  traMADol (ULTRAM) 50 MG tablet Take 1-2 tablets (50-100 mg total) by mouth 3 (three) times daily as needed for moderate pain. 05/07/18  Yes Eustaquio BoydenGutierrez, Javier, MD  traZODone (DESYREL) 100 MG tablet Take 1 tablet (100 mg total) by mouth at bedtime. Patient taking differently: Take 100 mg by mouth at bedtime as needed for sleep.  07/02/18  Yes Eustaquio BoydenGutierrez, Javier, MD  OXYGEN Inhale 2 L into the lungs at bedtime.     [provider]    Family History Family History  Problem Relation Age of Onset   CAD Mother        MI   Cancer Mother        cervical   CAD Maternal Aunt        MI   CAD Sister        MI   Sudden death Father [id number]       died in his sleep, chain smoker   Cirrhosis Father    Hypertension Daughter    Diabetes Maternal Aunt    Cancer Maternal Grandmother        cervical    Social History Social History   Tobacco Use   Smoking status: Former Smoker    Packs/day: 2.00    Years: 46.00    Pack years: 92.00    Types: Cigarettes    Last attempt to quit: 11/08/2012    Years since quitting: 6.2   Smokeless tobacco: Never Used  Substance Use Topics   Alcohol use: No    Alcohol/week: 0.0 standard drinks   Drug use: No     Allergies   Doxycycline   Review of Systems Review of Systems  Constitutional: Positive for fatigue. Negative for appetite change and fever.  HENT: Negative for sore throat.   Eyes: Negative for visual disturbance.  Respiratory: Positive for shortness of breath (comes and goes, dyspnea with laying flat is chronic). Negative for cough.   Cardiovascular: Positive for chest pain and leg swelling.  Gastrointestinal: Positive for constipation (hard stool yesterday), nausea and vomiting (one episode). Negative for abdominal pain and diarrhea.  Genitourinary: Negative for difficulty urinating and dysuria.  Musculoskeletal: Negative for back pain and neck pain.  Skin: Negative for rash.  Neurological: Positive for light-headedness and headaches. Negative for syncope, facial asymmetry, weakness and numbness.     Physical Exam Updated Vital Signs BP 133/72    Pulse 65    Temp 98.1 F (36.7 C) (Oral)    Resp 14    Ht 5' (1.524 m)    Wt 71.7 kg    SpO2 100%    BMI 30.86 kg/m   Physical Exam Vitals signs and nursing note reviewed.  Constitutional:      General: She is not in acute distress.  Appearance: She is well-developed. She is  not diaphoretic.  HENT:     Head: Normocephalic and atraumatic.  Eyes:     Conjunctiva/sclera: Conjunctivae normal.  Neck:     Musculoskeletal: Normal range of motion.  Cardiovascular:     Rate and Rhythm: Normal rate and regular rhythm.     Heart sounds: Normal heart sounds. No murmur. No friction rub. No gallop.   Pulmonary:     Effort: Pulmonary effort is normal. No respiratory distress.     Breath sounds: Normal breath sounds. No wheezing or rales.  Abdominal:     General: There is no distension.     Palpations: Abdomen is soft.     Tenderness: There is no abdominal tenderness. There is no guarding.  Musculoskeletal:        General: No tenderness.  Skin:    General: Skin is warm and dry.     Findings: No erythema or rash.  Neurological:     Mental Status: She is alert and oriented to person, place, and time.      ED Treatments / Results  Labs (all labs ordered are listed, but only abnormal results are displayed) Labs Reviewed  BASIC METABOLIC PANEL - Abnormal; Notable for the following components:      Result Value   Glucose, Bld 117 (*)    Creatinine, Ser 1.14 (*)    GFR calc non Af Amer 48 (*)    GFR calc Af Amer 56 (*)    All other components within normal limits  URINALYSIS, ROUTINE W REFLEX MICROSCOPIC - Abnormal; Notable for the following components:   APPearance HAZY (*)    Leukocytes,Ua MODERATE (*)    Bacteria, UA MANY (*)    All other components within normal limits  URINE CULTURE  CBC  TROPONIN I  BRAIN NATRIURETIC PEPTIDE  HEPATIC FUNCTION PANEL  TROPONIN I  TROPONIN I  TROPONIN I  BASIC METABOLIC PANEL  CBC    EKG EKG Interpretation  Date/Time:  Thursday January 24 2019 15:11:56 EDT Ventricular Rate:  84 PR Interval:  192 QRS Duration: 74 QT Interval:  368 QTC Calculation: 434 R Axis:   -7 Text Interpretation:  Normal sinus rhythm Possible Left atrial enlargement Borderline ECG No significant change since last tracing Confirmed by  Alvira Monday ([id number]) on 01/24/2019 4:37:24 PM   Radiology Dg Chest 2 View  Result Date: 01/24/2019 CLINICAL DATA:  73 year old female with chest pain for 2 weeks EXAM: CHEST - 2 VIEW COMPARISON:  None. FINDINGS: Cardiomediastinal silhouette unchanged in size and contour. No evidence of interlobular septal thickening. Chronic right posterior sixth rib deformity. No new acute fracture. Thoracic compression fractures unchanged from the comparison, resulting in kyphotic deformity. No pneumothorax pleural effusion or confluent airspace disease. Chronic interstitial opacities. IMPRESSION: Negative for acute cardiopulmonary disease Electronically Signed   By: Gilmer Mor D.O.   On: 01/24/2019 15:44   Ct Head Wo Contrast  Result Date: 01/24/2019 CLINICAL DATA:  Chronic headache, chest pain, nausea and vomiting for last week which has worsened, shortness of breath when lying down, normal neurologic exam; history stroke, COPD, hypertension, peripheral vascular disease EXAM: CT HEAD WITHOUT CONTRAST TECHNIQUE: Contiguous axial images were obtained from the base of the skull through the vertex without intravenous contrast. Sagittal and coronal MPR images reconstructed from axial data set. COMPARISON:  07/30/2018 FINDINGS: Brain: Marked generalized atrophy. Normal ventricular morphology. No midline shift or mass effect. Mild small vessel chronic ischemic changes of deep cerebral white matter. No  intracranial hemorrhage, mass lesion, or evidence of acute infarction. No extra-axial fluid collections. Vascular: Atherosclerotic calcifications of internal carotid arteries at skull base Skull: Demineralized but intact Sinuses/Orbits: Clear Other: N/A IMPRESSION: Pronounced cerebral atrophy with mild small vessel chronic ischemic changes of deep cerebral white matter. No acute intracranial abnormalities. Electronically Signed   By: Ulyses Southward M.D.   On: 01/24/2019 17:59    Procedures Procedures (including critical  care time)  Medications Ordered in ED Medications  aspirin EC tablet 81 mg (has no administration in time range)  traMADol (ULTRAM) tablet 50-100 mg (has no administration in time range)  atorvastatin (LIPITOR) tablet 40 mg (has no administration in time range)  benazepril (LOTENSIN) tablet 5 mg (has no administration in time range)  sertraline (ZOLOFT) tablet 100 mg (has no administration in time range)  traZODone (DESYREL) tablet 100 mg (has no administration in time range)  levothyroxine (SYNTHROID) tablet 50 mcg (has no administration in time range)  clopidogrel (PLAVIX) tablet 75 mg (has no administration in time range)  albuterol (VENTOLIN HFA) 108 (90 Base) MCG/ACT inhaler 2 puff (has no administration in time range)  mometasone-formoterol (DULERA) 200-5 MCG/ACT inhaler 2 puff (has no administration in time range)  nitroGLYCERIN (NITROSTAT) SL tablet 0.4 mg (has no administration in time range)  acetaminophen (TYLENOL) tablet 650 mg (has no administration in time range)  ondansetron (ZOFRAN) injection 4 mg (has no administration in time range)  enoxaparin (LOVENOX) injection 40 mg (has no administration in time range)  sodium chloride flush (NS) 0.9 % injection 3 mL (has no administration in time range)  sodium chloride flush (NS) 0.9 % injection 3 mL (has no administration in time range)  0.9 %  sodium chloride infusion (has no administration in time range)  cefTRIAXone (ROCEPHIN) 1 g in sodium chloride 0.9 % 100 mL IVPB (has no administration in time range)  sodium chloride flush (NS) 0.9 % injection 3 mL (3 mLs Intravenous Given 01/24/19 1833)  aspirin chewable tablet 324 mg (324 mg Oral Given 01/24/19 1827)     Initial Impression / Assessment and Plan / ED Course  I have reviewed the triage vital signs and the nursing notes.  Pertinent labs & imaging results that were available during my care of the patient were reviewed by me and considered in my medical decision making (see  chart for details).        73yo female with history of COPD, PVD, CVA, hyperlipidemia presents with concern for chest pain, nausea, headaches with her biggest concern being chest pain.   Headaches present for 2 weeks, severe. CT Head without acute findings. No neurologic symptoms, no hx to suggest CVA< hx not consistent with SAH.  Given nausea, UA ordered and shows possible UTI. Ordered Rocephin.  Abdominal exam benign, no pain.  Differential diagnosis for chest pain includes pulmonary embolus, dissection, pneumothorax, pneumonia, ACS, myocarditis, pericarditis.  EKG was done and evaluate by me and showed no acute ST changes and no signs of pericarditis. Chest x-ray was done and evaluated by me and radiology and showed no sign of pneumonia or pneumothorax.  Not having consistent dyspnea, low suspicion for PE or dissection by history and exam.  Does have some findings consistent with CHF on exam while CXR not showing edema.  She is high risk HEART score given multiple risk factors, pressure like pain. Will plan admission for further chest pain rule out.    Final Clinical Impressions(s) / ED Diagnoses   Final diagnoses:  Nonspecific chest  pain  Urinary tract infection without hematuria, site unspecified    ED Discharge Orders    None       Alvira Monday, MD 01/24/19 [id number]

## 2019-01-24 NOTE — Telephone Encounter (Signed)
Patient's daughter, [name], states her mother has been having nausea, intermittent headaches and (she thinks) chest pain X2-3 days and is very concerned.    She thinks her mother is not being honest with her about her symptoms.    I spoke directly with patient to triage and patient reports:  BP'S running 147-181/73-83, HR 70's Chest pain intermittently building X 2 weeks  *This am Chest Pain described as a pressure, rated as an "8" on a scale of 0-10.  Presently denies any chest pain.   Current sx's: Dizziness (new onset in the past week) SOB increased during episodes of chest pain HA - relieved with Tylenol but ongoing X 2-3 days Ongoing Nausea X 2-3 days.  She denies any fever, cough, chest congestion, sore throat,   No numbness, tingling, or visual changes.  Speech is clear and coherent at the present.  Denies any acute confusion.    She is fearful of going to hospital due to COVID outbreak but will defer to PCP's recommendations.

## 2019-01-24 NOTE — ED Triage Notes (Signed)
Pt reports having chest pain and nausea for the last week that has got worse. Pt was reports sob worse with lying night,

## 2019-01-24 NOTE — ED Notes (Signed)
Patient transported to X-ray 

## 2019-01-24 NOTE — H&P (Addendum)
History and Physical    [name] [name] JXB:[id number] DOB: [birth year] DOA: 01/24/2019  PCP: [name], MD   Patient coming from: Home   Chief Complaint: Chest pain, nausea, DOE   HPI: [name] [name] is a 73 y.o. 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  Patient's family with noted her to appear uncomfortable and short of breath with activity recently, and upon questioning, she reported intermittent chest pain for the past 2 weeks.  Chest pain is described as a pressure sensation, mainly localized to the left chest, with occasional radiation to the left arm, and associated with nausea and shortness of breath.  Symptoms seem to occur with activity, patient acknowledges associated nausea and dyspnea, but has not been coughing or febrile.  She reports history of bilateral lower extremity swelling, but none now.  Her daughter assists with the history and is unaware of any COVID-19 contacts.  Patient is also reporting some intermittent headaches over the past few days that are mild and relieved with Tylenol at home.  ED Course: Upon arrival to the ED, patient is found to be afebrile, saturating well on room air, and with normal heart rate and blood pressure.  EKG features a sinus rhythm and chest x-ray is negative for acute cardiopulmonary disease.  Noncontrast head CT is negative for acute intracranial abnormality.  Chemistry panel is notable for chronic mild renal insufficiency and CBC is unremarkable.  Troponin is undetectable and BNP is normal.  Patient was given 324 mg of aspirin in the ED, remains hemodynamically stable, denies any pain at this time, and will be observed for ongoing evaluation and  management.  Review of Systems:  All other systems reviewed and apart from HPI, are negative.  Past Medical History:  Diagnosis Date  . Aneurysm (HCC)   . Anxiety   . Anxiety and depression   . Carotid stenosis    R 50% (12/2012)  . Concussion 08/03/2015  . COPD (chronic obstructive pulmonary disease) (HCC) 12/2012   spirometry: Pre: FVC 84%, FEV1 69%, ratio 0.64 consistent with moderate obstruction.  . Depression   . Fall 08/03/2015   d/c home health 08/2015  . Fracture of cervical vertebra, C5 (HCC) 08/06/2015  . History of chicken pox   . Hyperlipidemia   . Hypertension   . Lower back pain    h/o HNP s/p surgery  . Osteoarthritis    h/o ruptured disc s/p ESI  . Osteoporosis 11/2010   DEXA -2.7 spine, thoracic compression fracture  . Peripheral vascular disease (HCC)   . Smoker    quit 10/2012  . Stroke [facility]) 2010   x3 with residual R hemiparesis, s/p R MCA balloon angioplasty (2010)    Past Surgical History:  Procedure Laterality Date  . APPENDECTOMY  1960  . CATARACT EXTRACTION     bilateral  . CESAREAN SECTION    . CHOLECYSTECTOMY  1970  . COLONOSCOPY  2004   diverticulosis, no polyps Jarold Motto)  . COLONOSCOPY  05/2016   decreased sphincter tone, diverticulosis, no f/u recommended ([facility])  . DEXA  11/2010   T -2.7 spine, -1.9 hip  . HIP SURGERY Left 2006   fractured - screws placed  . IR ANGIO INTRA EXTRACRAN SEL COM CAROTID INNOMINATE BILAT MOD SED  08/01/2018  . IR  ANGIO INTRA EXTRACRAN SEL COM CAROTID INNOMINATE BILAT MOD SED  12/10/2018  . IR ANGIO VERTEBRAL SEL SUBCLAVIAN INNOMINATE UNI R MOD SED  08/01/2018  . IR ANGIO VERTEBRAL SEL SUBCLAVIAN INNOMINATE UNI R MOD SED  12/10/2018  . IR ANGIO VERTEBRAL SEL VERTEBRAL UNI L MOD SED  12/10/2018  . IR RADIOLOGIST EVAL & MGMT  01/09/2018  . KYPHOPLASTY  10/02/2017   Procedure: LUMBAR FOUR KYPHOPLASTY;  Surgeon: Coletta Memos, MD;  Location: [facility] OR;  Service: Neurosurgery;;  . RADIOLOGY WITH ANESTHESIA N/A 11/11/2015    Procedure: RADIOLOGY WITH ANESTHESIA;  Surgeon: Julieanne Cotton, MD;  Location: [facility] OR;  Service: Radiology;  Laterality: N/A;  . RADIOLOGY WITH ANESTHESIA N/A 12/10/2018   Procedure: STENTING;  Surgeon: Julieanne Cotton, MD;  Location: [facility] OR;  Service: Radiology;  Laterality: N/A;     reports that she quit smoking about 6 years ago. Her smoking use included cigarettes. She has a 92.00 pack-year smoking history. She has never used smokeless tobacco. She reports that she does not drink alcohol or use drugs.  Allergies  Allergen Reactions  . Doxycycline Other (See Comments)    Sig GI upset    Family History  Problem Relation Age of Onset  . CAD Mother        MI  . Cancer Mother        cervical  . CAD Maternal Aunt        MI  . CAD Sister        MI  . Sudden death Father 76       died in his sleep, chain smoker  . Cirrhosis Father   . Hypertension Daughter   . Diabetes Maternal Aunt   . Cancer Maternal Grandmother        cervical     Prior to Admission medications   Medication Sig Start Date End Date Taking? Authorizing Provider  acetaminophen (TYLENOL) 500 MG tablet Take 1 tablet (500 mg total) by mouth every 6 (six) hours as needed for headache. 01/01/19  Yes [name], MD  albuterol (PROVENTIL HFA;VENTOLIN HFA) 108 (90 Base) MCG/ACT inhaler Inhale 2 puffs into the lungs every 6 (six) hours as needed for wheezing or shortness of breath. 11/19/18  Yes [name], MD  aspirin (ASPIRIN 81) 81 MG EC tablet Take 1 tablet (81 mg total) by mouth daily. Swallow whole. 12/15/18  Yes [name], MD  atorvastatin (LIPITOR) 40 MG tablet TAKE 1 TABLET (40 MG TOTAL) BY MOUTH DAILY. 12/28/17  Yes [name], MD  benazepril (LOTENSIN) 5 MG tablet Take 1 tablet (5 mg total) by mouth daily. 11/20/17  Yes [name], MD  CALCIUM-VITAMIN D PO Take 1 tablet by mouth daily.   Yes [provider]  Cholecalciferol (VITAMIN D3) 25 MCG (1000 UT) CAPS Take 1,000  Units by mouth daily.   Yes [provider]  clopidogrel (PLAVIX) 75 MG tablet Take 1 tablet (75 mg total) by mouth daily. 12/31/18  Yes [name], MD  fexofenadine (ALLEGRA) 180 MG tablet Take 180 mg by mouth daily as needed for allergies.    Yes [provider]  fluticasone-salmeterol (ADVAIR HFA) 115-21 MCG/ACT inhaler Inhale 2 puffs into the lungs 2 (two) times daily. Rinse mouth after use. 11/21/17  Yes Shane Crutch, MD  furosemide (LASIX) 20 MG tablet Take 1 tablet (20 mg total) by mouth as needed for fluid or edema. May take once or twice a week for swelling Patient taking differently: Take 20 mg by mouth  daily as needed for fluid or edema.  05/07/18  Yes [name], MD  levothyroxine (SYNTHROID, LEVOTHROID) 50 MCG tablet TAKE 1 TABLET  DAILY BEFORE BREAKFAST. Patient taking differently: Take 50 mcg by mouth daily before breakfast.  12/03/18  Yes [name], MD  Multiple Vitamins-Minerals (MULTIVITAMIN PO) Take 1 tablet by mouth daily.   Yes [provider]  Potassium Chloride ER 20 MEQ TBCR Take 20 mEq by mouth daily as needed (with Lasix).  07/02/18  Yes [name], MD  sertraline (ZOLOFT) 100 MG tablet Take 1 tablet (100 mg total) by mouth daily. 07/02/18  Yes [name], MD  traMADol (ULTRAM) 50 MG tablet Take 1-2 tablets (50-100 mg total) by mouth 3 (three) times daily as needed for moderate pain. 05/07/18  Yes [name], MD  traZODone (DESYREL) 100 MG tablet Take 1 tablet (100 mg total) by mouth at bedtime. Patient taking differently: Take 100 mg by mouth at bedtime as needed for sleep.  07/02/18  Yes [name], MD  OXYGEN Inhale 2 L into the lungs at bedtime.    [provider]    Physical Exam: Vitals:   01/24/19 1713 01/24/19 1714 01/24/19 1715 01/24/19 1716  BP:   133/72   Pulse:   65   Resp: Temp:      TempSrc:      SpO2:   100%   Weight:    71.7 kg  Height:    5' (1.524 m)     Constitutional: NAD, calm  Eyes: PERTLA, lids and conjunctivae normal ENMT: Mucous membranes are moist. Posterior pharynx clear of any exudate or lesions.   Neck: normal, supple, no masses, no thyromegaly Respiratory: no wheezing, no crackles. No accessory muscle use.  Cardiovascular: S1 & S2 heard, regular rate and rhythm. No extremity edema.  Abdomen: No distension, no tenderness, soft. Bowel sounds active.  Musculoskeletal: no clubbing / cyanosis. No joint deformity upper and lower extremities.   Skin: no significant rashes, lesions, ulcers. Warm, dry, well-perfused. Neurologic: PERRL, no dysarthria. Sensation intact. Moving all extremities. Left-sided weakness.   Psychiatric: Alert and oriented to person and place, not month or year. Very pleasant and cooperative.    Labs on Admission: I have personally reviewed following labs and imaging studies  CBC: Recent Labs  Lab 01/24/19 1509  WBC 8.2  HGB 13.3  HCT 41.6  MCV 96.3  PLT 171   Basic Metabolic Panel: Recent Labs  Lab 01/24/19 1509  NA 140  K 4.3  CL 103  CO2 25  GLUCOSE 117*  BUN 22  CREATININE 1.14*  CALCIUM 10.0   GFR: Estimated Creatinine Clearance: 39.4 mL/min (A) (by C-G formula based on SCr of 1.14 mg/dL (H)). Liver Function Tests: Recent Labs  Lab 01/24/19 1705  AST 28  ALT 25  ALKPHOS 103  BILITOT 0.4  PROT 7.7  ALBUMIN 4.0   No results for input(s): LIPASE, AMYLASE in the last 168 hours. No results for input(s): AMMONIA in the last 168 hours. Coagulation Profile: No results for input(s): INR, PROTIME in the last 168 hours. Cardiac Enzymes: Recent Labs  Lab 01/24/19 1509  TROPONINI <0.03   BNP (last 3 results) No results for input(s): PROBNP in the last 8760 hours. HbA1C: No results for input(s): HGBA1C in the last 72 hours. CBG: No results for input(s): GLUCAP in the last 168 hours. Lipid Profile: No results for input(s): CHOL, HDL, LDLCALC, TRIG, CHOLHDL, LDLDIRECT in the last  72 hours.  Thyroid Function Tests: No results for input(s): TSH, T4TOTAL, FREET4, T3FREE, THYROIDAB in the last 72 hours. Anemia Panel: No results for input(s): VITAMINB12, FOLATE, FERRITIN, TIBC, IRON, RETICCTPCT in the last 72 hours. Urine analysis:    Component Value Date/Time   COLORURINE YELLOW 01/24/2019 1723   APPEARANCEUR HAZY (A) 01/24/2019 1723   LABSPEC 1.015 01/24/2019 1723   PHURINE 5.0 01/24/2019 1723   GLUCOSEU NEGATIVE 01/24/2019 1723   HGBUR NEGATIVE 01/24/2019 1723   BILIRUBINUR NEGATIVE 01/24/2019 1723   BILIRUBINUR negative 11/19/2018 1639   KETONESUR NEGATIVE 01/24/2019 1723   PROTEINUR NEGATIVE 01/24/2019 1723   UROBILINOGEN 0.2 11/19/2018 1639   UROBILINOGEN 0.2 08/02/2015 1200   NITRITE NEGATIVE 01/24/2019 1723   LEUKOCYTESUR MODERATE (A) 01/24/2019 1723   Sepsis Labs: @LABRCNTIP (procalcitonin:4,lacticidven:4) )No results found for this or any previous visit (from the past 240 hour(s)).   Radiological Exams on Admission: Dg Chest 2 View  Result Date: 01/24/2019 CLINICAL DATA:  73 year old female with chest pain for 2 weeks EXAM: CHEST - 2 VIEW COMPARISON:  None. FINDINGS: Cardiomediastinal silhouette unchanged in size and contour. No evidence of interlobular septal thickening. Chronic right posterior sixth rib deformity. No new acute fracture. Thoracic compression fractures unchanged from the comparison, resulting in kyphotic deformity. No pneumothorax pleural effusion or confluent airspace disease. Chronic interstitial opacities. IMPRESSION: Negative for acute cardiopulmonary disease Electronically Signed   By: Gilmer MorJaime  Wagner D.O.   On: 01/24/2019 15:44   Ct Head Wo Contrast  Result Date: 01/24/2019 CLINICAL DATA:  Chronic headache, chest pain, nausea and vomiting for last week which has worsened, shortness of breath when lying down, normal neurologic exam; history stroke, COPD, hypertension, peripheral vascular disease EXAM: CT HEAD WITHOUT CONTRAST  TECHNIQUE: Contiguous axial images were obtained from the base of the skull through the vertex without intravenous contrast. Sagittal and coronal MPR images reconstructed from axial data set. COMPARISON:  07/30/2018 FINDINGS: Brain: Marked generalized atrophy. Normal ventricular morphology. No midline shift or mass effect. Mild small vessel chronic ischemic changes of deep cerebral white matter. No intracranial hemorrhage, mass lesion, or evidence of acute infarction. No extra-axial fluid collections. Vascular: Atherosclerotic calcifications of internal carotid arteries at skull base Skull: Demineralized but intact Sinuses/Orbits: Clear Other: N/A IMPRESSION: Pronounced cerebral atrophy with mild small vessel chronic ischemic changes of deep cerebral white matter. No acute intracranial abnormalities. Electronically Signed   By: Ulyses SouthwardMark  Boles M.D.   On: 01/24/2019 17:59    EKG: Independently reviewed. Sinus rhythm.   Assessment/Plan   1. Chest pain  - Presents with 2 weeks of intermittent chest pain  - Initial troponin is <0.03, EKG features a sinus rhythm without appreciable acute ischemic features, and CXR is unremarkable  - She has known vascular disease, has some difficulty providing history but sxs seem to occur with activity per report of patient's daughter  - ASA 324 mg was administered in ED  - Continue cardiac monitoring, obtain serial troponin measurements, repeat EKG, continue ASA, Plavix, statin, and ACE-I    2. COPD, chronic hypoxic respiratory failure  - No cough or wheezing on admission  - Continue ICS/LABA and as-needed albuterol    3. Chronic diastolic CHF  - Appears compensated  - Continue ACE-i, daily weights   4. History of CVA  - She has residual cognitive and left-sided motor deficits  - Continue ASA, Plavix, and statin    5. Hypothyroidism  - Continue Synthroid    6. CKD stage III  - SCr is 1.14 on admission, similar  to priors  - Renally-dose medications    7.  Depression  - Continue Zoloft and trazodone   8. Chronic back pain  - No pain complaints on admission  - Continue home regimen with as-needed Ultram    PPE: Mask, face shield DVT prophylaxis: Lovenox  Code Status: Full  Family Communication: Discussed with daughter by phone   Consults called: None  Admission status: Observation    Briscoe Deutscher, MD [facility] Pager [phone]  If 7PM-7AM, please contact night-coverage [email or web address] Password [facility]  01/24/2019, 6:39 PM

## 2019-01-24 NOTE — ED Notes (Signed)
ED TO INPATIENT HANDOFF REPORT  ED Nurse Name and Phone #: [phone]  S Name/Age/Gender [name] [name] 73 y.o. female Room/Bed: 021C/021C  Code Status   Code Status: Full Code  Home/SNF/Other Home Patient oriented to: self, place, time and situation Is this baseline? Yes   Triage Complete: Triage complete  Chief Complaint chest pain/n/stomach pain  Triage Note Pt reports having chest pain and nausea for the last week that has got worse. Pt was reports sob worse with lying night,    Allergies Allergies  Allergen Reactions  . Doxycycline Other (See Comments)    Sig GI upset    Level of Care/Admitting Diagnosis ED Disposition    ED Disposition Condition Comment   Admit  Hospital Area: [facility] [[id number]]  Level of Care: Telemetry Cardiac [103]  I expect the patient will be discharged within 24 hours: Yes  LOW acuity---Tx typically complete <24 hrs---ACUTE conditions typically can be evaluated <24 hours---LABS likely to return to acceptable levels <24 hours---IS near functional baseline---EXPECTED to return to current living arrangement---NOT newly hypoxic: Meets criteria for 5C-Observation unit  Covid Evaluation: N/A  Diagnosis: Chest pain at rest [527782]  Admitting Physician: Briscoe Deutscher [[id number]]  Attending Physician: Briscoe Deutscher [[id number]]  PT Class (Do Not Modify): Observation [104]  PT Acc Code (Do Not Modify): Observation [10022]       B Medical/Surgery History Past Medical History:  Diagnosis Date  . Aneurysm (HCC)   . Anxiety   . Anxiety and depression   . Carotid stenosis    R 50% (12/2012)  . Concussion 08/03/2015  . COPD (chronic obstructive pulmonary disease) (HCC) 12/2012   spirometry: Pre: FVC 84%, FEV1 69%, ratio 0.64 consistent with moderate obstruction.  . Depression   . Fall 08/03/2015   d/c home health 08/2015  . Fracture of cervical vertebra, C5 (HCC) 08/06/2015  . History of chicken pox   . Hyperlipidemia   .  Hypertension   . Lower back pain    h/o HNP s/p surgery  . Osteoarthritis    h/o ruptured disc s/p ESI  . Osteoporosis 11/2010   DEXA -2.7 spine, thoracic compression fracture  . Peripheral vascular disease (HCC)   . Smoker    quit 10/2012  . Stroke [facility]) 2010   x3 with residual R hemiparesis, s/p R MCA balloon angioplasty (2010)   Past Surgical History:  Procedure Laterality Date  . APPENDECTOMY  1960  . CATARACT EXTRACTION     bilateral  . CESAREAN SECTION    . CHOLECYSTECTOMY  1970  . COLONOSCOPY  2004   diverticulosis, no polyps Jarold Motto)  . COLONOSCOPY  05/2016   decreased sphincter tone, diverticulosis, no f/u recommended ([place])  . DEXA  11/2010   T -2.7 spine, -1.9 hip  . HIP SURGERY Left 2006   fractured - screws placed  . IR ANGIO INTRA EXTRACRAN SEL COM CAROTID INNOMINATE BILAT MOD SED  08/01/2018  . IR ANGIO INTRA EXTRACRAN SEL COM CAROTID INNOMINATE BILAT MOD SED  12/10/2018  . IR ANGIO VERTEBRAL SEL SUBCLAVIAN INNOMINATE UNI R MOD SED  08/01/2018  . IR ANGIO VERTEBRAL SEL SUBCLAVIAN INNOMINATE UNI R MOD SED  12/10/2018  . IR ANGIO VERTEBRAL SEL VERTEBRAL UNI L MOD SED  12/10/2018  . IR RADIOLOGIST EVAL & MGMT  01/09/2018  . KYPHOPLASTY  10/02/2017   Procedure: LUMBAR FOUR KYPHOPLASTY;  Surgeon: Coletta Memos, MD;  [facility];  Service: Neurosurgery;;  . RADIOLOGY WITH ANESTHESIA N/A 11/11/2015  Procedure: RADIOLOGY WITH ANESTHESIA;  Surgeon: Julieanne Cotton, MD;  Location: [facility] OR;  Service: Radiology;  Laterality: N/A;  . RADIOLOGY WITH ANESTHESIA N/A 12/10/2018   Procedure: STENTING;  Surgeon: Julieanne Cotton, MD;  Location: [facility] OR;  Service: Radiology;  Laterality: N/A;     A IV Location/Drains/Wounds Patient Lines/Drains/Airways Status   Active Line/Drains/Airways    Name:   Placement date:   Placement time:   Site:   Days:   Peripheral IV 01/24/19 Right Forearm   01/24/19    1832    Forearm   less than 1   Incision (Closed) 10/02/17 Back   10/02/17     2029     479   Incision (Closed) 08/01/18 Groin Right   08/01/18    1355     176   Incision (Closed) 12/10/18 Groin Right   12/10/18    1015     45   Pressure Injury 11/14/17 Stage I -  Intact skin with non-blanchable redness of a localized area usually over a bony prominence.   11/14/17    1617     436          Intake/Output Last 24 hours No intake or output data in the 24 hours ending 01/24/19 1857  Labs/Imaging Results for orders placed or performed during the hospital encounter of 01/24/19 (from the past 48 hour(s))  Basic metabolic panel     Status: Abnormal   Collection Time: 01/24/19  3:09 PM  Result Value Ref Range   Sodium 140 135 - 145 mmol/L   Potassium 4.3 3.5 - 5.1 mmol/L   Chloride 103 98 - 111 mmol/L   CO2 25 22 - 32 mmol/L   Glucose, Bld 117 (H) 70 - 99 mg/dL   BUN 22 8 - 23 mg/dL   Creatinine, Ser 0.98 (H) 0.44 - 1.00 mg/dL   Calcium 11.9 8.9 - 14.7 mg/dL   GFR calc non Af Amer 48 (L) >60 mL/min   GFR calc Af Amer 56 (L) >60 mL/min   Anion gap 12 5 - 15    Comment: Performed at [facility], [address], [place], [place] [address]  CBC     Status: None   Collection Time: 01/24/19  3:09 PM  Result Value Ref Range   WBC 8.2 4.0 - 10.5 K/uL   RBC 4.32 3.87 - 5.11 MIL/uL   Hemoglobin 13.3 12.0 - 15.0 g/dL   HCT 21.3 08.6 - 57.8 %   MCV 96.3 80.0 - 100.0 fL   MCH 30.8 26.0 - 34.0 pg   MCHC 32.0 30.0 - 36.0 g/dL   RDW 46.9 62.9 - 52.8 %   Platelets 171 150 - 400 K/uL   nRBC 0.0 0.0 - 0.2 %    Comment: Performed at [facility], [address], [place], [place] [address]  Troponin I - ONCE - STAT     Status: None   Collection Time: 01/24/19  3:09 PM  Result Value Ref Range   Troponin I <0.03 <0.03 ng/mL    Comment: Performed at [facility], [address], [place], [place] [address]  Brain natriuretic peptide     Status: None   Collection Time: 01/24/19  5:05 PM  Result Value Ref Range   B Natriuretic Peptide 49.9 0.0 - 100.0 pg/mL     Comment: Performed at [facility], [address], [place], [place] [address]  Hepatic function panel  Status: None   Collection Time: 01/24/19  5:05 PM  Result Value Ref Range   Total Protein 7.7 6.5 - 8.1 g/dL   Albumin 4.0 3.5 - 5.0 g/dL   AST 28 15 - 41 U/L   ALT 25 0 - 44 U/L   Alkaline Phosphatase 103 38 - 126 U/L   Total Bilirubin 0.4 0.3 - 1.2 mg/dL   Bilirubin, Direct <1.6 0.0 - 0.2 mg/dL   Indirect Bilirubin NOT CALCULATED 0.3 - 0.9 mg/dL    Comment: Performed at [facility], [address], [place], [place] [address]  Urinalysis, Routine w reflex microscopic     Status: Abnormal   Collection Time: 01/24/19  5:23 PM  Result Value Ref Range   Color, Urine YELLOW YELLOW   APPearance HAZY (A) CLEAR   Specific Gravity, Urine 1.015 1.005 - 1.030   pH 5.0 5.0 - 8.0   Glucose, UA NEGATIVE NEGATIVE mg/dL   Hgb urine dipstick NEGATIVE NEGATIVE   Bilirubin Urine NEGATIVE NEGATIVE   Ketones, ur NEGATIVE NEGATIVE mg/dL   Protein, ur NEGATIVE NEGATIVE mg/dL   Nitrite NEGATIVE NEGATIVE   Leukocytes,Ua MODERATE (A) NEGATIVE   RBC / HPF 0-5 0 - 5 RBC/hpf   WBC, UA 6-10 0 - 5 WBC/hpf   Bacteria, UA MANY (A) NONE SEEN   Squamous Epithelial / LPF 0-5 0 - 5   Mucus PRESENT     Comment: Performed at [facility], [address] [place], [place] [address]   Dg Chest 2 View  Result Date: 01/24/2019 CLINICAL DATA:  73 year old female with chest pain for 2 weeks EXAM: CHEST - 2 VIEW COMPARISON:  None. FINDINGS: Cardiomediastinal silhouette unchanged in size and contour. No evidence of interlobular septal thickening. Chronic right posterior sixth rib deformity. No new acute fracture. Thoracic compression fractures unchanged from the comparison, resulting in kyphotic deformity. No pneumothorax pleural effusion or confluent airspace disease. Chronic interstitial opacities. IMPRESSION: Negative for acute cardiopulmonary disease Electronically Signed   By: Gilmer Mor D.O.    On: 01/24/2019 15:44   Ct Head Wo Contrast  Result Date: 01/24/2019 CLINICAL DATA:  Chronic headache, chest pain, nausea and vomiting for last week which has worsened, shortness of breath when lying down, normal neurologic exam; history stroke, COPD, hypertension, peripheral vascular disease EXAM: CT HEAD WITHOUT CONTRAST TECHNIQUE: Contiguous axial images were obtained from the base of the skull through the vertex without intravenous contrast. Sagittal and coronal MPR images reconstructed from axial data set. COMPARISON:  07/30/2018 FINDINGS: Brain: Marked generalized atrophy. Normal ventricular morphology. No midline shift or mass effect. Mild small vessel chronic ischemic changes of deep cerebral white matter. No intracranial hemorrhage, mass lesion, or evidence of acute infarction. No extra-axial fluid collections. Vascular: Atherosclerotic calcifications of internal carotid arteries at skull base Skull: Demineralized but intact Sinuses/Orbits: Clear Other: N/A IMPRESSION: Pronounced cerebral atrophy with mild small vessel chronic ischemic changes of deep cerebral white matter. No acute intracranial abnormalities. Electronically Signed   By: Ulyses Southward M.D.   On: 01/24/2019 17:59    Pending Labs Unresulted Labs (From admission, onward)    Start     Ordered   01/31/19 0500  Creatinine, serum  (enoxaparin (LOVENOX)    CrCl >/= 30 ml/min)  Weekly,   R    Comments:  while on enoxaparin therapy    01/24/19 1837   01/25/19 0500  Basic metabolic panel  Tomorrow morning,   R     01/24/19 1838  01/25/19 0500  CBC  Tomorrow morning,   R     01/24/19 1838   01/24/19 1838  Troponin I - Now Then Q6H  Now then every 6 hours,   STAT     01/24/19 1838   01/24/19 1703  Urine culture  ONCE - STAT,   STAT     01/24/19 1703          Vitals/Pain Today's Vitals   01/24/19 1714 01/24/19 1715 01/24/19 1716 01/24/19 1809  BP:  133/72    Pulse:  65    Resp: 16 14    Temp:      TempSrc:      SpO2:   100%    Weight:   71.7 kg   Height:   5' (1.524 m)   PainSc:    2     Isolation Precautions No active isolations  Medications Medications  aspirin EC tablet 81 mg (has no administration in time range)  traMADol (ULTRAM) tablet 50-100 mg (has no administration in time range)  atorvastatin (LIPITOR) tablet 40 mg (has no administration in time range)  benazepril (LOTENSIN) tablet 5 mg (has no administration in time range)  sertraline (ZOLOFT) tablet 100 mg (has no administration in time range)  traZODone (DESYREL) tablet 100 mg (has no administration in time range)  levothyroxine (SYNTHROID) tablet 50 mcg (has no administration in time range)  clopidogrel (PLAVIX) tablet 75 mg (has no administration in time range)  albuterol (VENTOLIN HFA) 108 (90 Base) MCG/ACT inhaler 2 puff (has no administration in time range)  mometasone-formoterol (DULERA) 200-5 MCG/ACT inhaler 2 puff (has no administration in time range)  nitroGLYCERIN (NITROSTAT) SL tablet 0.4 mg (has no administration in time range)  acetaminophen (TYLENOL) tablet 650 mg (has no administration in time range)  ondansetron (ZOFRAN) injection 4 mg (has no administration in time range)  enoxaparin (LOVENOX) injection 40 mg (has no administration in time range)  sodium chloride flush (NS) 0.9 % injection 3 mL (has no administration in time range)  sodium chloride flush (NS) 0.9 % injection 3 mL (has no administration in time range)  0.9 %  sodium chloride infusion (has no administration in time range)  cefTRIAXone (ROCEPHIN) 1 g in sodium chloride 0.9 % 100 mL IVPB (has no administration in time range)  sodium chloride flush (NS) 0.9 % injection 3 mL (3 mLs Intravenous Given 01/24/19 1833)  aspirin chewable tablet 324 mg (324 mg Oral Given 01/24/19 1827)    Mobility walks Low fall risk   Focused Assessments Cardiac Assessment Handoff:  Cardiac Rhythm: Normal sinus rhythm Lab Results  Component Value Date   CKTOTAL 47  10/01/2008   CKMB 0.7 10/01/2008   TROPONINI <0.03 01/24/2019   No results found for: DDIMER Does the Patient currently have chest pain? Yes     R Recommendations: See Admitting Provider Note  Report given to:   Additional Notes:

## 2019-01-24 NOTE — ED Notes (Signed)
Nurse Navigator communication with updates for the patients admission at the request of her daughter [name]. Pt was given her daughters contact number. Information about room number and unit given. They were very appreciative for the contact.

## 2019-01-25 ENCOUNTER — Observation Stay (HOSPITAL_BASED_OUTPATIENT_CLINIC_OR_DEPARTMENT_OTHER): Payer: Medicare HMO

## 2019-01-25 DIAGNOSIS — R072 Precordial pain: Secondary | ICD-10-CM | POA: Diagnosis not present

## 2019-01-25 DIAGNOSIS — E039 Hypothyroidism, unspecified: Secondary | ICD-10-CM | POA: Diagnosis not present

## 2019-01-25 DIAGNOSIS — J449 Chronic obstructive pulmonary disease, unspecified: Secondary | ICD-10-CM | POA: Diagnosis not present

## 2019-01-25 DIAGNOSIS — Z7982 Long term (current) use of aspirin: Secondary | ICD-10-CM | POA: Diagnosis not present

## 2019-01-25 DIAGNOSIS — F331 Major depressive disorder, recurrent, moderate: Secondary | ICD-10-CM | POA: Diagnosis not present

## 2019-01-25 DIAGNOSIS — R079 Chest pain, unspecified: Secondary | ICD-10-CM

## 2019-01-25 DIAGNOSIS — Z7902 Long term (current) use of antithrombotics/antiplatelets: Secondary | ICD-10-CM | POA: Diagnosis not present

## 2019-01-25 DIAGNOSIS — I5032 Chronic diastolic (congestive) heart failure: Secondary | ICD-10-CM | POA: Diagnosis not present

## 2019-01-25 DIAGNOSIS — J9611 Chronic respiratory failure with hypoxia: Secondary | ICD-10-CM | POA: Diagnosis not present

## 2019-01-25 DIAGNOSIS — I208 Other forms of angina pectoris: Secondary | ICD-10-CM

## 2019-01-25 DIAGNOSIS — I130 Hypertensive heart and chronic kidney disease with heart failure and stage 1 through stage 4 chronic kidney disease, or unspecified chronic kidney disease: Secondary | ICD-10-CM | POA: Diagnosis not present

## 2019-01-25 LAB — TROPONIN I
Troponin I: <0.03 ng/mL (ref <0.03)
Troponin I: <0.03 ng/mL (ref <0.03)

## 2019-01-25 LAB — BASIC METABOLIC PANEL
Anion gap: 10 (ref 5–15)
BUN: 22 mg/dL (ref 8–23)
CO2: 23 mmol/L (ref 22–32)
Calcium: 8.9 mg/dL (ref 8.9–10.3)
Chloride: 105 mmol/L (ref 98–111)
Creatinine, Ser: 1.01 mg/dL — ABNORMAL HIGH (ref 0.44–1.00)
GFR calc Af Amer: >60 mL/min (ref >60)
GFR calc non Af Amer: 56 mL/min — ABNORMAL LOW (ref >60)
Glucose, Bld: 94 mg/dL (ref 70–99)
Potassium: 4.6 mmol/L (ref 3.5–5.1)
Sodium: 138 mmol/L (ref 135–145)

## 2019-01-25 LAB — NM MYOCAR MULTI W/SPECT W/WALL MOTION / EF
Estimated workload: 1 METS
Exercise duration (min): 0 min
Exercise duration (sec): 0 s
MPHR: 148 {beats}/min
Peak HR: 109 {beats}/min
Percent HR: 73 %
Rest HR: 60 {beats}/min

## 2019-01-25 LAB — CBC
HCT: 33.2 % — ABNORMAL LOW (ref 36.0–46.0)
Hemoglobin: 10.8 g/dL — ABNORMAL LOW (ref 12.0–15.0)
MCH: 30.9 pg (ref 26.0–34.0)
MCHC: 32.5 g/dL (ref 30.0–36.0)
MCV: 95.1 fL (ref 80.0–100.0)
Platelets: 134 10*3/uL — ABNORMAL LOW (ref 150–400)
RBC: 3.49 MIL/uL — ABNORMAL LOW (ref 3.87–5.11)
RDW: 12.9 % (ref 11.5–15.5)
WBC: 5.1 10*3/uL (ref 4.0–10.5)
nRBC: 0 % (ref 0.0–0.2)

## 2019-01-25 MED ORDER — TECHNETIUM TC 99M TETROFOSMIN IV KIT
30.0000 | PACK | Freq: Once | INTRAVENOUS | Status: AC | PRN
  Administered 2019-01-25: 30 via INTRAVENOUS

## 2019-01-25 MED ORDER — REGADENOSON 0.4 MG/5ML IV SOLN
INTRAVENOUS | Status: AC
  Administered 2019-01-25: 0.4 mg
  Filled 2019-01-25: qty 5

## 2019-01-25 MED ORDER — TECHNETIUM TC 99M TETROFOSMIN IV KIT
10.0000 | PACK | Freq: Once | INTRAVENOUS | Status: AC | PRN
  Administered 2019-01-25: 10 via INTRAVENOUS

## 2019-01-25 NOTE — Telephone Encounter (Signed)
Noted. Thank you. Admitted to hospital.

## 2019-01-25 NOTE — Progress Notes (Addendum)
   Patient presented for a nuclear stress test today, which was ordered by Dr. Jerral Ralph ([facility]). Patient reportedly has mild cognitive impairment from previous strokes. However, patient was alert and orient to person, birthday, and place (city and hospital). She was able to tell me why she was in the hospital and repeat the important parts of the procedure to me. It was felt like she was able to consent (Discussed this with Dr. Jerral Ralph who agreed).  No immediate complications. Stress imaging is pending at this time.   Preliminary EKG findings may be listed in the chart, but the stress test result will not be finalized until perfusion imaging is complete.   Corrin Parker, PA-C 01/25/2019 10:42 AM

## 2019-01-25 NOTE — Telephone Encounter (Signed)
Noted. Thanks.

## 2019-01-25 NOTE — Discharge Summary (Signed)
Physician Discharge Summary  [name] [name] MWN:[id number] DOB: April 18, 1946 DOA: 01/24/2019  PCP: [name], MD  Admit date: 01/24/2019 Discharge date: 01/25/2019  Admitted From: Home Disposition: Home  Recommendations for Outpatient Follow-up:  1. Follow up with PCP in 1-2 weeks   Home Health: None Equipment/Devices: None   Discharge Condition: Stable CODE STATUS: Full code Diet recommendation: Cardiac diet  Brief/Interim Summary: Patient with history of hypertension, history of TIA with left hemiparesis, hypothyroidism chronic respiratory failure on 2 L of oxygen and stage III chronic kidney disease due to hypertension, she has history of carotid stenosis.  Patient presented to the emergency room with 2 weeks of intermittent spasmodic left precordial chest pain.  Chest x-ray were normal.  D-dimer was normal. EKG and serial troponins were nonischemic.  Patient is currently chest pain-free.  She underwent a Lexiscan which was essentially normal for any reversible ischemia.  EF was normal.  Patient is currently asymptomatic. Patient is already on dual antiplatelet therapy with aspirin and Plavix along with statin that she will continue.  No change in medications done.  Discharge Diagnoses:  Principal Problem:   Chest pain Active Problems:   Hypertension   History of lacunar cerebrovascular accident (CVA)   COPD (chronic obstructive pulmonary disease) (HCC)   MDD (major depressive disorder), recurrent episode, moderate (HCC)   Hypothyroidism (acquired)   Chronic respiratory failure with hypoxia (HCC)   Chronic back pain   Chronic diastolic heart failure (HCC)   CKD (chronic kidney disease) stage 3, GFR 30-59 ml/min [facility])    Discharge Instructions  Discharge Instructions    Call MD for:  difficulty breathing, headache or visual disturbances   Complete by:  As directed    Call MD for:  severe uncontrolled pain   Complete by:  As directed    Diet - low sodium heart  healthy   Complete by:  As directed    Increase activity slowly   Complete by:  As directed      Allergies as of 01/25/2019      Reactions   Doxycycline Other (See Comments)   Sig GI upset      Medication List    TAKE these medications   acetaminophen 500 MG tablet Commonly known as:  TYLENOL Take 1 tablet (500 mg total) by mouth every 6 (six) hours as needed for headache.   albuterol 108 (90 Base) MCG/ACT inhaler Commonly known as:  VENTOLIN HFA Inhale 2 puffs into the lungs every 6 (six) hours as needed for wheezing or shortness of breath.   aspirin 81 MG EC tablet Commonly known as:  Aspirin 81 Take 1 tablet (81 mg total) by mouth daily. Swallow whole.   atorvastatin 40 MG tablet Commonly known as:  LIPITOR TAKE 1 TABLET (40 MG TOTAL) BY MOUTH DAILY.   benazepril 5 MG tablet Commonly known as:  LOTENSIN Take 1 tablet (5 mg total) by mouth daily.   CALCIUM-VITAMIN D PO Take 1 tablet by mouth daily.   clopidogrel 75 MG tablet Commonly known as:  PLAVIX Take 1 tablet (75 mg total) by mouth daily.   fexofenadine 180 MG tablet Commonly known as:  ALLEGRA Take 180 mg by mouth daily as needed for allergies.   fluticasone-salmeterol 115-21 MCG/ACT inhaler Commonly known as:  Advair HFA Inhale 2 puffs into the lungs 2 (two) times daily. Rinse mouth after use.   furosemide 20 MG tablet Commonly known as:  Lasix Take 1 tablet (20 mg total) by mouth as needed for  fluid or edema. May take once or twice a week for swelling What changed:    when to take this  additional instructions   levothyroxine 50 MCG tablet Commonly known as:  SYNTHROID TAKE 1 TABLET  DAILY BEFORE BREAKFAST.   MULTIVITAMIN PO Take 1 tablet by mouth daily.   OXYGEN Inhale 2 L into the lungs at bedtime.   Potassium Chloride ER 20 MEQ Tbcr Take 20 mEq by mouth daily as needed (with Lasix).   sertraline 100 MG tablet Commonly known as:  ZOLOFT Take 1 tablet (100 mg total) by mouth daily.    traMADol 50 MG tablet Commonly known as:  ULTRAM Take 1-2 tablets (50-100 mg total) by mouth 3 (three) times daily as needed for moderate pain.   traZODone 100 MG tablet Commonly known as:  DESYREL Take 1 tablet (100 mg total) by mouth at bedtime. What changed:    when to take this  reasons to take this   Vitamin D3 25 MCG (1000 UT) Caps Take 1,000 Units by mouth daily.       Allergies  Allergen Reactions  . Doxycycline Other (See Comments)    Sig GI upset    Consultations:  None   Procedures/Studies: Dg Chest 2 View  Result Date: 01/24/2019 CLINICAL DATA:  73 year old female with chest pain for 2 weeks EXAM: CHEST - 2 VIEW COMPARISON:  None. FINDINGS: Cardiomediastinal silhouette unchanged in size and contour. No evidence of interlobular septal thickening. Chronic right posterior sixth rib deformity. No new acute fracture. Thoracic compression fractures unchanged from the comparison, resulting in kyphotic deformity. No pneumothorax pleural effusion or confluent airspace disease. Chronic interstitial opacities. IMPRESSION: Negative for acute cardiopulmonary disease Electronically Signed   By: Gilmer Mor D.O.   On: 01/24/2019 15:44   Ct Head Wo Contrast  Result Date: 01/24/2019 CLINICAL DATA:  Chronic headache, chest pain, nausea and vomiting for last week which has worsened, shortness of breath when lying down, normal neurologic exam; history stroke, COPD, hypertension, peripheral vascular disease EXAM: CT HEAD WITHOUT CONTRAST TECHNIQUE: Contiguous axial images were obtained from the base of the skull through the vertex without intravenous contrast. Sagittal and coronal MPR images reconstructed from axial data set. COMPARISON:  07/30/2018 FINDINGS: Brain: Marked generalized atrophy. Normal ventricular morphology. No midline shift or mass effect. Mild small vessel chronic ischemic changes of deep cerebral white matter. No intracranial hemorrhage, mass lesion, or evidence of  acute infarction. No extra-axial fluid collections. Vascular: Atherosclerotic calcifications of internal carotid arteries at skull base Skull: Demineralized but intact Sinuses/Orbits: Clear Other: N/A IMPRESSION: Pronounced cerebral atrophy with mild small vessel chronic ischemic changes of deep cerebral white matter. No acute intracranial abnormalities. Electronically Signed   By: Ulyses Southward M.D.   On: 01/24/2019 17:59   Nm Myocar Multi W/spect W/wall Motion / Ef  Result Date: 01/25/2019 CLINICAL DATA:  73 year old with chest pain. EXAM: MYOCARDIAL IMAGING WITH SPECT (REST AND PHARMACOLOGIC-STRESS) GATED LEFT VENTRICULAR WALL MOTION STUDY LEFT VENTRICULAR EJECTION FRACTION TECHNIQUE: Standard myocardial SPECT imaging was performed after resting intravenous injection of 10 mCi Tc-69m tetrofosmin. Subsequently, intravenous infusion of Lexiscan was performed under the supervision of the Cardiology staff. At peak effect of the drug, 30 mCi Tc-74m tetrofosmin was injected intravenously and standard myocardial SPECT imaging was performed. Quantitative gated imaging was also performed to evaluate left ventricular wall motion, and estimate left ventricular ejection fraction. COMPARISON:  None. FINDINGS: Perfusion: No decreased activity in the left ventricle on stress imaging to suggest reversible ischemia  or infarction. Increased uptake along the inferior wall on the rest images is probably related to gut uptake. Wall Motion: Normal left ventricular wall motion. No left ventricular dilation. Left Ventricular Ejection Fraction: 71 % End diastolic volume 56 ml End systolic volume 16 ml IMPRESSION: 1. No reversible ischemia or infarction. 2. Normal left ventricular wall motion. 3. Left ventricular ejection fraction is 71%. 4. Non invasive risk stratification*: Low *2012 Appropriate Use Criteria for Coronary Revascularization Focused Update: J Am Coll Cardiol. 2012;59(9):857-881.  http://content.dementiazones.com.aspx?[email or web address] Electronically Signed   By: Richarda Overlie M.D.   On: 01/25/2019 14:50    (Echo, Carotid, EGD, Colonoscopy, ERCP)    Subjective: Patient seen and examined at the time of discharge.  She had mild episode of pain on her left upper chest last night but denies any other symptoms. No recurrence of symptoms with stress test. Discharged home, patient's daughter was called and updated about the test results.   Discharge Exam: Vitals:   01/25/19 1144 01/25/19 1353  BP: (!) 123/48 (!) 104/46  Pulse: 72 78  Resp: 17 18  Temp: 97.7 F (36.5 C) 98.7 F (37.1 C)  SpO2: 98% 95%   Vitals:   01/25/19 1035 01/25/19 1037 01/25/19 1144 01/25/19 1353  BP: (!) 149/80 (!) 174/92 (!) 123/48 (!) 104/46  Pulse:   72 78  Resp:   17 18  Temp:   97.7 F (36.5 C) 98.7 F (37.1 C)  TempSrc:   Oral Oral  SpO2:   98% 95%  Weight:      Height:        General: Pt is alert, awake, not in acute distress Cardiovascular: RRR, S1/S2 +, no rubs, no gallops Respiratory: CTA bilaterally, no wheezing, no rhonchi Abdominal: Soft, NT, ND, bowel sounds + Extremities: no edema, no cyanosis    The results of significant diagnostics from this hospitalization (including imaging, microbiology, ancillary and laboratory) are listed below for reference.     Microbiology: Recent Results (from the past 240 hour(s))  Urine culture     Status: Abnormal (Preliminary result)   Collection Time: 01/24/19  5:23 PM  Result Value Ref Range Status   Specimen Description URINE, RANDOM  Final   Special Requests   Final    NONE Performed at [facility], [address] [place], [place] [address]    Culture >=100,000 COLONIES/mL GRAM NEGATIVE RODS (A)  Final   Report Status PENDING  Incomplete     Labs: BNP (last 3 results) Recent Labs    07/31/18 0105 01/24/19 1705  BNP 105.5* 49.9   Basic Metabolic Panel: Recent Labs  Lab 01/24/19 1509 01/25/19 0641   NA 140 138  K 4.3 4.6  CL 103 105  CO2 25 23  GLUCOSE 117* 94  BUN 22 22  CREATININE 1.14* 1.01*  CALCIUM 10.0 8.9   Liver Function Tests: Recent Labs  Lab 01/24/19 1705  AST 28  ALT 25  ALKPHOS 103  BILITOT 0.4  PROT 7.7  ALBUMIN 4.0   No results for input(s): LIPASE, AMYLASE in the last 168 hours. No results for input(s): AMMONIA in the last 168 hours. CBC: Recent Labs  Lab 01/24/19 1509 01/25/19 0641  WBC 8.2 5.1  HGB 13.3 10.8*  HCT 41.6 33.2*  MCV 96.3 95.1  PLT 171 134*   Cardiac Enzymes: Recent Labs  Lab 01/24/19 1509 01/24/19 1838 01/25/19 0006 01/25/19 0641  TROPONINI <0.03 <0.03 <0.03 <0.03   BNP: Invalid input(s): POCBNP CBG: No results for  input(s): GLUCAP in the last 168 hours. D-Dimer No results for input(s): DDIMER in the last 72 hours. Hgb A1c No results for input(s): HGBA1C in the last 72 hours. Lipid Profile No results for input(s): CHOL, HDL, LDLCALC, TRIG, CHOLHDL, LDLDIRECT in the last 72 hours. Thyroid function studies No results for input(s): TSH, T4TOTAL, T3FREE, THYROIDAB in the last 72 hours.  Invalid input(s): FREET3 Anemia work up No results for input(s): VITAMINB12, FOLATE, FERRITIN, TIBC, IRON, RETICCTPCT in the last 72 hours. Urinalysis    Component Value Date/Time   COLORURINE YELLOW 01/24/2019 1723   APPEARANCEUR HAZY (A) 01/24/2019 1723   LABSPEC 1.015 01/24/2019 1723   PHURINE 5.0 01/24/2019 1723   GLUCOSEU NEGATIVE 01/24/2019 1723   HGBUR NEGATIVE 01/24/2019 1723   BILIRUBINUR NEGATIVE 01/24/2019 1723   BILIRUBINUR negative 11/19/2018 1639   KETONESUR NEGATIVE 01/24/2019 1723   PROTEINUR NEGATIVE 01/24/2019 1723   UROBILINOGEN 0.2 11/19/2018 1639   UROBILINOGEN 0.2 08/02/2015 1200   NITRITE NEGATIVE 01/24/2019 1723   LEUKOCYTESUR MODERATE (A) 01/24/2019 1723   Sepsis Labs Invalid input(s): PROCALCITONIN,  WBC,  LACTICIDVEN Microbiology Recent Results (from the past 240 hour(s))  Urine culture      Status: Abnormal (Preliminary result)   Collection Time: 01/24/19  5:23 PM  Result Value Ref Range Status   Specimen Description URINE, RANDOM  Final   Special Requests   Final    NONE Performed at [facility], [address], [place], [place] [address]    Culture >=100,000 COLONIES/mL GRAM NEGATIVE RODS (A)  Final   Report Status PENDING  Incomplete     Time coordinating discharge: 25 minutes  SIGNED:   Dorcas Carrow, MD  [facility] 01/25/2019, 3:22 PM Pager [phone]  If 7PM-7AM, please contact night-coverage [email or web address] Password [email or web address]

## 2019-01-25 NOTE — Progress Notes (Signed)
D/C instructions given to pt. Medications reviewed. All questions answered. IV removed, clean and intact. Granddaughter to escort pt home.  Versie Starks, RN

## 2019-01-27 ENCOUNTER — Other Ambulatory Visit: Payer: Self-pay | Admitting: Internal Medicine

## 2019-01-27 LAB — URINE CULTURE: Culture: >=100000 — AB

## 2019-01-27 MED ORDER — CEPHALEXIN 500 MG PO CAPS: 500.0000 mg | ORAL_CAPSULE | Freq: Three times a day (TID) | ORAL | 0 refills | Status: AC

## 2019-01-27 NOTE — Progress Notes (Signed)
Urine culture reported more than 100,000 colonies of E. coli that was pansensitive.  She did have malodorous urine.  Currently no active symptoms.  Will treat with Keflex 500 mg 3 times a day for 5 days.  Prescription sent to her pharmacy.  Patient was called and notified.

## 2019-01-29 ENCOUNTER — Encounter: Payer: Self-pay | Admitting: Family Medicine

## 2019-01-29 ENCOUNTER — Ambulatory Visit (INDEPENDENT_AMBULATORY_CARE_PROVIDER_SITE_OTHER): Payer: Medicare HMO | Admitting: Family Medicine

## 2019-01-29 VITALS — BP 140/79 | HR 83 | Temp 98.4°F | Ht 61.5 in | Wt 161.0 lb

## 2019-01-29 DIAGNOSIS — I5032 Chronic diastolic (congestive) heart failure: Secondary | ICD-10-CM

## 2019-01-29 DIAGNOSIS — N183 Chronic kidney disease, stage 3 unspecified: Secondary | ICD-10-CM

## 2019-01-29 DIAGNOSIS — R079 Chest pain, unspecified: Secondary | ICD-10-CM

## 2019-01-29 DIAGNOSIS — N3000 Acute cystitis without hematuria: Secondary | ICD-10-CM

## 2019-01-29 DIAGNOSIS — I69351 Hemiplegia and hemiparesis following cerebral infarction affecting right dominant side: Secondary | ICD-10-CM

## 2019-01-29 MED ORDER — PANTOPRAZOLE SODIUM 40 MG PO TBEC: 40.0000 mg | DELAYED_RELEASE_TABLET | Freq: Every day | ORAL | 3 refills | Status: DC

## 2019-01-29 NOTE — Progress Notes (Signed)
Virtual visit completed through [employer]. Due to national recommendations of social distancing due to COVID 19, a virtual visit is felt to be most appropriate for this patient at this time.   Patient location: home, [name] daughter also on call with her Provider location: Guadalupe Guerra at [facility], office If any vitals were documented, they were collected by patient at home unless specified below.    BP 140/79 (BP Location: Left Arm, Patient Position: Sitting)   Pulse 83   Temp 98.4 F (36.9 C) (Oral)   Ht 5' 1.5" (1.562 m)   Wt 161 lb (73 kg)   BMI 29.93 kg/m    CC: hosp f/u  Subjective:    Patient ID: [name] [name], female    DOB: [birth year], 73 y.o.   MRN: [id number]  HPI: [name] [name] is a 73 y.o. female presenting on 01/29/2019 for Hospitalization Follow-up   Recent hospitalization with chest pain concerning for cardiac cause associated with nausea/vomiting, dyspnea - progressively worsening over 2 weeks. Work-up overall reassuring with normal CXR, D dimer, EKG and serial troponins. She underwent reassuring Lexiscan negative for reversible ischemia. EF was normal (71%). No med changes done.   Since home feeling well. Some intermittent headaches but no further chest pain. Never reproducible chest pain, not positional.  Some ongoing GERD noted - worse with spicy foods. She takes tums PRN a few times a week.   She has been trying to be more active at home.   UCx in hospital grew E coli - treated with keflex 500mg  tid x 5 days.   Admit date: 01/24/2019 Discharge date: 01/25/2019 TCM f/u phone call not performed - but patient seen within 2 days of hospital discharge.   Admitted From: Home  Disposition: Home   Recommendations for Outpatient Follow-up:  1. Follow up with PCP in 1-2 weeks  Home Health: None Equipment/Devices: None   Discharge Condition: Stable CODE STATUS: Full code Diet recommendation: Cardiac diet     Relevant past medical, surgical, family  and social history reviewed and updated as indicated. Interim medical history since our last visit reviewed. Allergies and medications reviewed and updated. Outpatient Medications Prior to Visit  Medication Sig Dispense Refill  . acetaminophen (TYLENOL) 500 MG tablet Take 1 tablet (500 mg total) by mouth every 6 (six) hours as needed for headache.    . albuterol (PROVENTIL HFA;VENTOLIN HFA) 108 (90 Base) MCG/ACT inhaler Inhale 2 puffs into the lungs every 6 (six) hours as needed for wheezing or shortness of breath. 1 Inhaler 2  . aspirin (ASPIRIN 81) 81 MG EC tablet Take 1 tablet (81 mg total) by mouth daily. Swallow whole.    [name] atorvastatin (LIPITOR) 40 MG tablet TAKE 1 TABLET (40 MG TOTAL) BY MOUTH DAILY. 90 tablet 2  . benazepril (LOTENSIN) 5 MG tablet Take 1 tablet (5 mg total) by mouth daily. 30 tablet 3  . CALCIUM-VITAMIN D PO Take 1 tablet by mouth daily.    . cephALEXin (KEFLEX) 500 MG capsule Take 1 capsule (500 mg total) by mouth 3 (three) times daily for 5 days. 15 capsule 0  . Cholecalciferol (VITAMIN D3) 25 MCG (1000 UT) CAPS Take 1,000 Units by mouth daily.    . clopidogrel (PLAVIX) 75 MG tablet Take 1 tablet (75 mg total) by mouth daily. 90 tablet 1  . fexofenadine (ALLEGRA) 180 MG tablet Take 180 mg by mouth daily as needed for allergies.     . fluticasone-salmeterol (ADVAIR HFA) 115-21 MCG/ACT inhaler Inhale 2  puffs into the lungs 2 (two) times daily. Rinse mouth after use. 1 Inhaler 12  . furosemide (LASIX) 20 MG tablet Take 1 tablet (20 mg total) by mouth as needed for fluid or edema. May take once or twice a week for swelling (Patient taking differently: Take 20 mg by mouth daily as needed for fluid or edema. ) 40 tablet 1  . levothyroxine (SYNTHROID, LEVOTHROID) 50 MCG tablet TAKE 1 TABLET  DAILY BEFORE BREAKFAST. (Patient taking differently: Take 50 mcg by mouth daily before breakfast. ) 90 tablet 0  . Multiple Vitamins-Minerals (MULTIVITAMIN PO) Take 1 tablet by mouth daily.     . OXYGEN Inhale 2 L into the lungs at bedtime.    . Potassium Chloride ER 20 MEQ TBCR Take 20 mEq by mouth daily as needed (with Lasix).     [name] sertraline (ZOLOFT) 100 MG tablet Take 1 tablet (100 mg total) by mouth daily. 90 tablet 3  . traMADol (ULTRAM) 50 MG tablet Take 1-2 tablets (50-100 mg total) by mouth 3 (three) times daily as needed for moderate pain. 30 tablet 0  . traZODone (DESYREL) 100 MG tablet Take 1 tablet (100 mg total) by mouth at bedtime. (Patient taking differently: Take 100 mg by mouth at bedtime as needed for sleep. ) 90 tablet 3   No facility-administered medications prior to visit.      Per HPI unless specifically indicated in ROS section below Review of Systems Objective:    BP 140/79 (BP Location: Left Arm, Patient Position: Sitting)   Pulse 83   Temp 98.4 F (36.9 C) (Oral)   Ht 5' 1.5" (1.562 m)   Wt 161 lb (73 kg)   BMI 29.93 kg/m   Wt Readings from Last 3 Encounters:  01/29/19 161 lb (73 kg)  01/25/19 161 lb 6.4 oz (73.2 kg)  01/01/19 161 lb (73 kg)     Physical exam: Gen: alert, NAD, not ill appearing Pulm: speaks in complete sentences without increased work of breathing Psych: normal mood, normal thought content      Results for orders placed or performed during the hospital encounter of 01/24/19  Urine culture  Result Value Ref Range   Specimen Description URINE, RANDOM    Special Requests      NONE Performed at [facility], [address] [place], [place] [address]    Culture >=100,000 COLONIES/mL ESCHERICHIA COLI (A)    Report Status 01/27/2019 FINAL    Organism ID, Bacteria ESCHERICHIA COLI (A)       Susceptibility   Escherichia coli - MIC*    AMPICILLIN <=2 SENSITIVE Sensitive     CEFAZOLIN <=4 SENSITIVE Sensitive     CEFTRIAXONE <=1 SENSITIVE Sensitive     CIPROFLOXACIN <=0.25 SENSITIVE Sensitive     GENTAMICIN <=1 SENSITIVE Sensitive     IMIPENEM <=0.25 SENSITIVE Sensitive     NITROFURANTOIN <=16 SENSITIVE Sensitive      TRIMETH/SULFA <=20 SENSITIVE Sensitive     AMPICILLIN/SULBACTAM <=2 SENSITIVE Sensitive     PIP/TAZO <=4 SENSITIVE Sensitive     Extended ESBL NEGATIVE Sensitive     * >=100,000 COLONIES/mL ESCHERICHIA COLI  NM Myocar Multi W/Spect W/Wall Motion / EF  Result Value Ref Range   Rest HR 60 bpm   Rest BP 120/68 mmHg   Exercise duration (sec) 0 sec   Percent HR 73 %   Exercise duration (min) 0 min   Estimated workload 1.0 METS   Peak HR 109 bpm   Peak  BP 174/92 mmHg   MPHR 148 bpm  Basic metabolic panel  Result Value Ref Range   Sodium 140 135 - 145 mmol/L   Potassium 4.3 3.5 - 5.1 mmol/L   Chloride 103 98 - 111 mmol/L   CO2 25 22 - 32 mmol/L   Glucose, Bld 117 (H) 70 - 99 mg/dL   BUN 22 8 - 23 mg/dL   Creatinine, Ser 1.61 (H) 0.44 - 1.00 mg/dL   Calcium 09.6 8.9 - 04.5 mg/dL   GFR calc non Af Amer 48 (L) >60 mL/min   GFR calc Af Amer 56 (L) >60 mL/min   Anion gap 12 5 - 15  CBC  Result Value Ref Range   WBC 8.2 4.0 - 10.5 K/uL   RBC 4.32 3.87 - 5.11 MIL/uL   Hemoglobin 13.3 12.0 - 15.0 g/dL   HCT 40.9 81.1 - 91.4 %   MCV 96.3 80.0 - 100.0 fL   MCH 30.8 26.0 - 34.0 pg   MCHC 32.0 30.0 - 36.0 g/dL   RDW 78.2 95.6 - 21.3 %   Platelets 171 150 - 400 K/uL   nRBC 0.0 0.0 - 0.2 %  Troponin I - ONCE - STAT  Result Value Ref Range   Troponin I <0.03 <0.03 ng/mL  Urinalysis, Routine w reflex microscopic  Result Value Ref Range   Color, Urine YELLOW YELLOW   APPearance HAZY (A) CLEAR   Specific Gravity, Urine 1.015 1.005 - 1.030   pH 5.0 5.0 - 8.0   Glucose, UA NEGATIVE NEGATIVE mg/dL   Hgb urine dipstick NEGATIVE NEGATIVE   Bilirubin Urine NEGATIVE NEGATIVE   Ketones, ur NEGATIVE NEGATIVE mg/dL   Protein, ur NEGATIVE NEGATIVE mg/dL   Nitrite NEGATIVE NEGATIVE   Leukocytes,Ua MODERATE (A) NEGATIVE   RBC / HPF 0-5 0 - 5 RBC/hpf   WBC, UA 6-10 0 - 5 WBC/hpf   Bacteria, UA MANY (A) NONE SEEN   Squamous Epithelial / LPF 0-5 0 - 5   Mucus PRESENT   Brain natriuretic  peptide  Result Value Ref Range   B Natriuretic Peptide 49.9 0.0 - 100.0 pg/mL  Hepatic function panel  Result Value Ref Range   Total Protein 7.7 6.5 - 8.1 g/dL   Albumin 4.0 3.5 - 5.0 g/dL   AST 28 15 - 41 U/L   ALT 25 0 - 44 U/L   Alkaline Phosphatase 103 38 - 126 U/L   Total Bilirubin 0.4 0.3 - 1.2 mg/dL   Bilirubin, Direct <0.8 0.0 - 0.2 mg/dL   Indirect Bilirubin NOT CALCULATED 0.3 - 0.9 mg/dL  Troponin I - Now Then Q6H  Result Value Ref Range   Troponin I <0.03 <0.03 ng/mL  Troponin I - Now Then Q6H  Result Value Ref Range   Troponin I <0.03 <0.03 ng/mL  Troponin I - Now Then Q6H  Result Value Ref Range   Troponin I <0.03 <0.03 ng/mL  Basic metabolic panel  Result Value Ref Range   Sodium 138 135 - 145 mmol/L   Potassium 4.6 3.5 - 5.1 mmol/L   Chloride 105 98 - 111 mmol/L   CO2 23 22 - 32 mmol/L   Glucose, Bld 94 70 - 99 mg/dL   BUN 22 8 - 23 mg/dL   Creatinine, Ser 6.57 (H) 0.44 - 1.00 mg/dL   Calcium 8.9 8.9 - 84.6 mg/dL   GFR calc non Af Amer 56 (L) >60 mL/min   GFR calc Af Amer >60 >60 mL/min  Anion gap 10 5 - 15  CBC  Result Value Ref Range   WBC 5.1 4.0 - 10.5 K/uL   RBC 3.49 (L) 3.87 - 5.11 MIL/uL   Hemoglobin 10.8 (L) 12.0 - 15.0 g/dL   HCT 47.4 (L) 25.9 - 56.3 %   MCV 95.1 80.0 - 100.0 fL   MCH 30.9 26.0 - 34.0 pg   MCHC 32.5 30.0 - 36.0 g/dL   RDW 87.5 64.3 - 32.9 %   Platelets 134 (L) 150 - 400 K/uL   nRBC 0.0 0.0 - 0.2 %   Assessment & Plan:   Problem List Items Addressed This Visit    UTI (urinary tract infection)    ER UCx growing >100k E coli - was placed on keflex 500mg  TID. With kidney insufficiency (latest GFR 40s) recommend renal dosing and decrease to BID to complete course. ?infection vs colonization given minimal LUTS.       Hemiparesis affecting right side as late effect of stroke (HCC)   CKD (chronic kidney disease) stage 3, GFR 30-59 ml/min (HCC)   Chronic diastolic heart failure (HCC)   Chest pain - Primary    Recent ER  workup reassuring.  Does not sound MSK.  Will treat possible GI contribution with pantoprazole course (daily for 2wks then PRN).  Update with effect.           Meds ordered this encounter  Medications  . pantoprazole (PROTONIX) 40 MG tablet    Sig: Take 1 tablet (40 mg total) by mouth daily. For 2 weeks then as needed    Dispense:  30 tablet    Refill:  3   No orders of the defined types were placed in this encounter.   Patient Instructions  Decrease antibiotic cephalexin (keflex) to twice daily to complete course.  Start protonix 40mg  daily for 2 weeks then as needed, take 30 minutes before largest meal for best effect. I'm glad heart evaluation in the hospital returned reassuring.     Follow up plan: No follow-ups on file.  [name], MD

## 2019-01-29 NOTE — Assessment & Plan Note (Signed)
Recent ER workup reassuring.  Does not sound MSK.  Will treat possible GI contribution with pantoprazole course (daily for 2wks then PRN).  Update with effect.

## 2019-01-29 NOTE — Patient Instructions (Signed)
Decrease antibiotic cephalexin (keflex) to twice daily to complete course.  Start protonix 40mg  daily for 2 weeks then as needed, take 30 minutes before largest meal for best effect. I'm glad heart evaluation in the hospital returned reassuring.

## 2019-01-29 NOTE — Assessment & Plan Note (Addendum)
ER UCx growing >100k E coli - was placed on keflex 500mg  TID. With kidney insufficiency (latest GFR 40s) recommend renal dosing and decrease to BID to complete course. ?infection vs colonization given minimal LUTS.

## 2019-02-15 DIAGNOSIS — R269 Unspecified abnormalities of gait and mobility: Secondary | ICD-10-CM | POA: Diagnosis not present

## 2019-02-15 DIAGNOSIS — J449 Chronic obstructive pulmonary disease, unspecified: Secondary | ICD-10-CM | POA: Diagnosis not present

## 2019-02-25 ENCOUNTER — Other Ambulatory Visit: Payer: Self-pay | Admitting: Family Medicine

## 2019-03-04 ENCOUNTER — Other Ambulatory Visit: Payer: Medicare HMO

## 2019-03-04 ENCOUNTER — Ambulatory Visit (INDEPENDENT_AMBULATORY_CARE_PROVIDER_SITE_OTHER): Payer: Medicare HMO | Admitting: Family Medicine

## 2019-03-04 ENCOUNTER — Encounter: Payer: Self-pay | Admitting: Family Medicine

## 2019-03-04 VITALS — BP 133/67 | HR 97 | Temp 96.6°F | Wt 161.0 lb

## 2019-03-04 DIAGNOSIS — R300 Dysuria: Secondary | ICD-10-CM | POA: Diagnosis not present

## 2019-03-04 DIAGNOSIS — N3090 Cystitis, unspecified without hematuria: Secondary | ICD-10-CM

## 2019-03-04 LAB — POCT URINALYSIS DIPSTICK
Bilirubin, UA: NEGATIVE
Blood, UA: POSITIVE
Glucose, UA: NEGATIVE
Ketones, UA: NEGATIVE
Nitrite, UA: POSITIVE
Protein, UA: POSITIVE — AB
Spec Grav, UA: 1.02 (ref 1.010–1.025)
Urobilinogen, UA: 0.2 E.U./dL
pH, UA: 6 (ref 5.0–8.0)

## 2019-03-04 MED ORDER — CEPHALEXIN 500 MG PO CAPS: 500.0000 mg | ORAL_CAPSULE | Freq: Two times a day (BID) | ORAL | 0 refills | Status: AC

## 2019-03-04 NOTE — Patient Instructions (Signed)
Hi Ms. Milan,  It was a pleasure to see you for your video visit on Monday, March 04, 2019.   As we discussed, you have a bladder infection.  I have sent you an antibiotic to your pharmacy.  If your symptoms do not completely resolve with the antibiotic please let us know.  If you develop fever, abdominal pain, nausea/vomiting please notify the office.  If it is at night or on the weekend please go to urgent care or the emergency room.  These symptoms could indicate infection in your kidneys.  There are several things that can increase your risk for having bladder infections including leaking urine and having to wear pull-ups, not drinking enough liquids and changes as we age.  Things that may be helpful include drinking enough liquids to make your urine light yellow, changing your pull-ups when they become wet or soiled and avoiding constipation and diarrhea if possible.  Warm regards,  Tor Netters, FNP-BC

## 2019-03-04 NOTE — Progress Notes (Signed)
Virtual Visit via Video Note  I connected with [place] on 03/04/19 at  1:50 PM EDT by a video enabled telemedicine application and verified that I am speaking with the correct person using two identifiers.  Location: Patient: In her home, her daughter Carolee Rota was present for the call.  Provider: [facility]   I discussed the limitations of evaluation and management by telemedicine and the availability of in person appointments. The patient expressed understanding and agreed to proceed.  History of Present Illness: This is a 73 year old female who requests virtual visit today to discuss urinary symptoms.  She has been having dysuria and urine odor for about a month.  She denies fever, abdominal pain, nausea, vomiting, back pain.  She reports occasional constipation and has been incontinent of urine prior to the symptoms starting.  She wears adult incontinence briefs.  She had an E. coli cystitis 01/24/2019 and was treated with cephalexin with full resolution of symptoms.  Past Medical History:  Diagnosis Date  . Aneurysm ([place])   . Anxiety   . Anxiety and depression   . Carotid stenosis    R 50% (12/2012)  . Concussion 08/03/2015  . COPD (chronic obstructive pulmonary disease) ([place]) 12/2012   spirometry: Pre: FVC 84%, FEV1 69%, ratio 0.64 consistent with moderate obstruction.  . Depression   . Fall 08/03/2015   d/c home health 08/2015  . Fracture of cervical vertebra, C5 (H[facility]) 08/06/2015  . History of chicken pox   . Hyperlipidemia   . Hypertension   . Lower back pain    h/o HNP s/p surgery  . Osteoarthritis    h/o ruptured disc s/p ESI  . Osteoporosis 11/2010   DEXA -2.7 spine, thoracic compression fracture  . Peripheral vascular disease ([place])   . Smoker    quit 10/2012  . Stroke [facility]) 2010   x3 with residual R hemiparesis, s/p R MCA balloon angioplasty (2010)   Past Surgical History:  Procedure Laterality Date  . APPENDECTOMY  1960  . CATARACT EXTRACTION      bilateral  . CESAREAN SECTION    . CHOLECYSTECTOMY  1970  . COLONOSCOPY  2004   diverticulosis, no polyps [facility])  . COLONOSCOPY  05/2016   decreased sphincter tone, diverticulosis, no f/u recommended ([place])  . DEXA  11/2010   T -2.7 spine, -1.9 hip  . HIP SURGERY Left 2006   fractured - screws placed  . IR ANGIO INTRA EXTRACRAN SEL COM CAROTID INNOMINATE BILAT MOD SED  08/01/2018  . IR ANGIO INTRA EXTRACRAN SEL COM CAROTID INNOMINATE BILAT MOD SED  12/10/2018  . IR ANGIO VERTEBRAL SEL SUBCLAVIAN INNOMINATE UNI R MOD SED  08/01/2018  . IR ANGIO VERTEBRAL SEL SUBCLAVIAN INNOMINATE UNI R MOD SED  12/10/2018  . IR ANGIO VERTEBRAL SEL VERTEBRAL UNI L MOD SED  12/10/2018  . IR RADIOLOGIST EVAL & MGMT  01/09/2018  . KYPHOPLASTY  10/02/2017   Procedure: LUMBAR FOUR KYPHOPLASTY;  Surgeon: Ashok Pall, MD;  Location: [place];  Service: Neurosurgery;;  . RADIOLOGY WITH ANESTHESIA N/A 11/11/2015   Procedure: RADIOLOGY WITH ANESTHESIA;  Surgeon: Luanne Bras, MD;  Location: [place];  Service: Radiology;  Laterality: N/A;  . RADIOLOGY WITH ANESTHESIA N/A 12/10/2018   Procedure: STENTING;  Surgeon: Luanne Bras, MD;  Location: [place];  Service: Radiology;  Laterality: N/A;   Family History  Problem Relation Age of Onset  . CAD Mother        MI  . Cancer Mother  cervical  . CAD Maternal Aunt        MI  . CAD Sister        MI  . Sudden death Father [id number]       died in his sleep, chain smoker  . Cirrhosis Father   . Hypertension Daughter   . Diabetes Maternal Aunt   . Cancer Maternal Grandmother        cervical   Social History   Tobacco Use  . Smoking status: Former Smoker    Packs/day: 2.00    Years: 46.00    Pack years: 92.00    Types: Cigarettes    Last attempt to quit: 11/08/2012    Years since quitting: 6.3  . Smokeless tobacco: Never Used  Substance Use Topics  . Alcohol use: No    Alcohol/week: 0.0 standard drinks  . Drug use: No       Observations/Objective: Patient is alert and answers questions appropriately.  Visible skin is unremarkable.  She is obese.  She is normally conversive without shortness of breath.  Her mood and affect are appropriate.  BP 133/67 (BP Location: Right Arm) Comment: per patient  Pulse 97 Comment: per patient  Temp (!) 96.6 F (35.9 C) Comment: per patient  Wt 161 lb (73 kg) Comment: per patient  BMI 29.93 kg/m  Wt Readings from Last 3 Encounters:  03/04/19 161 lb (73 kg)  01/29/19 161 lb (73 kg)  01/25/19 161 lb 6.4 oz (73.2 kg)   Results for orders placed or performed in visit on 03/04/19  POCT urinalysis dipstick  Result Value Ref Range   Color, UA yellow    Clarity, UA cloudy    Glucose, UA Negative Negative   Bilirubin, UA negative    Ketones, UA negative    Spec Grav, UA 1.020 1.010 - 1.025   Blood, UA positive-1+    pH, UA 6.0 5.0 - 8.0   Protein, UA Positive (A) Negative   Urobilinogen, UA 0.2 0.2 or 1.0 E.U./dL   Nitrite, UA positive    Leukocytes, UA Large (3+) (A) Negative   Appearance     Odor      Assessment and Plan: 1. Dysuria - POCT urinalysis dipstick  2. Cystitis -Discussed diagnosis with patient and her daughter, discussed return to clinic/ER/UC precautions -Follow-up if symptoms do not fully resolve with antibiotic - Urine Culture - cephALEXin (KEFLEX) 500 MG capsule; Take 1 capsule (500 mg total) by mouth every 12 (twelve) hours for 14 doses.  Dispense: 14 capsule; Refill: 0   Olean Reeeborah Gessner, FNP-BC  [facility], [facility]  03/04/2019 2:11 PM   Follow Up Instructions: After visit summary mailed to patient's residence and MyChart visit recap sent to patient as well.   I discussed the assessment and treatment plan with the patient. The patient was provided an opportunity to ask questions and all were answered. The patient agreed with the plan and demonstrated an understanding of the instructions.   The  patient was advised to call back or seek an in-person evaluation if the symptoms worsen or if the condition fails to improve as anticipated.  Emi Belfasteborah B Gessner, FNP

## 2019-03-07 LAB — URINE CULTURE
MICRO NUMBER:: 546682
SPECIMEN QUALITY:: ADEQUATE

## 2019-03-18 ENCOUNTER — Telehealth: Payer: Self-pay | Admitting: *Deleted

## 2019-03-18 DIAGNOSIS — R269 Unspecified abnormalities of gait and mobility: Secondary | ICD-10-CM | POA: Diagnosis not present

## 2019-03-18 DIAGNOSIS — J449 Chronic obstructive pulmonary disease, unspecified: Secondary | ICD-10-CM | POA: Diagnosis not present

## 2019-03-18 DIAGNOSIS — N3000 Acute cystitis without hematuria: Secondary | ICD-10-CM

## 2019-03-18 MED ORDER — CIPROFLOXACIN HCL 250 MG PO TABS: 250.0000 mg | ORAL_TABLET | Freq: Two times a day (BID) | ORAL | 0 refills | Status: DC

## 2019-03-18 NOTE — Telephone Encounter (Signed)
Patient's daughter called stating that her mom has been treated recently twice for UTI's. [name] stated that her mom finished the  antibiotic last week and the medication did not help her symptoms at all. Patient's daughter stated that she is still having a really bad odor to her urine and some burning. [name] stated that patient may need a different medication since what she has taken has not helped  and requested advice.  Pharmacy [facility]

## 2019-03-18 NOTE — Telephone Encounter (Signed)
UA supplies left at front for pick up

## 2019-03-18 NOTE — Telephone Encounter (Signed)
Forwarding to Dr. Leo Grosser as he is her PCP.

## 2019-03-18 NOTE — Telephone Encounter (Signed)
Spoke with [name] - ongoing urinary odor and dysuria similar UTI sxs as prior. No fevers, nausea.  Will place on cipro 250mg  BID x 1 wk.  Advised to collect urin and bring in for culture prior to starting abx. Daughter just had surgery so cannot drop off urine sample today - but should be able to tomorrow.  plz leave cup up front this afternoon for daughter/grand daughter to pick up.

## 2019-03-19 ENCOUNTER — Other Ambulatory Visit (INDEPENDENT_AMBULATORY_CARE_PROVIDER_SITE_OTHER): Payer: Medicare HMO

## 2019-03-19 DIAGNOSIS — N3000 Acute cystitis without hematuria: Secondary | ICD-10-CM

## 2019-03-19 NOTE — Telephone Encounter (Signed)
Urine sample dropped off and drawn up for ucx.  Fyi to [place].

## 2019-03-20 LAB — URINE CULTURE
MICRO NUMBER:: 598341
SPECIMEN QUALITY:: ADEQUATE

## 2019-04-17 DIAGNOSIS — J449 Chronic obstructive pulmonary disease, unspecified: Secondary | ICD-10-CM | POA: Diagnosis not present

## 2019-04-17 DIAGNOSIS — R269 Unspecified abnormalities of gait and mobility: Secondary | ICD-10-CM | POA: Diagnosis not present

## 2019-04-19 ENCOUNTER — Other Ambulatory Visit: Payer: Self-pay | Admitting: Family Medicine

## 2019-04-24 ENCOUNTER — Other Ambulatory Visit: Payer: Self-pay | Admitting: Family Medicine

## 2019-04-24 MED ORDER — FUROSEMIDE 20 MG PO TABS: 20.0000 mg | ORAL_TABLET | ORAL | 1 refills | Status: DC | PRN

## 2019-04-24 NOTE — Telephone Encounter (Signed)
plz notify this was sent in. 

## 2019-04-24 NOTE — Telephone Encounter (Signed)
Patient's daughter notified by telephone that script has been sent in. 

## 2019-04-24 NOTE — Telephone Encounter (Signed)
Patient's Daughter Called and stated that the patient is completely out of this medication and they would like it sent as soon as possible

## 2019-04-24 NOTE — Telephone Encounter (Signed)
Noted  

## 2019-05-06 ENCOUNTER — Other Ambulatory Visit: Payer: Self-pay

## 2019-05-06 ENCOUNTER — Encounter: Payer: Self-pay | Admitting: Family Medicine

## 2019-05-06 ENCOUNTER — Other Ambulatory Visit (INDEPENDENT_AMBULATORY_CARE_PROVIDER_SITE_OTHER): Payer: Medicare HMO

## 2019-05-06 ENCOUNTER — Ambulatory Visit (INDEPENDENT_AMBULATORY_CARE_PROVIDER_SITE_OTHER): Payer: Medicare HMO | Admitting: Family Medicine

## 2019-05-06 VITALS — BP 126/62 | HR 84 | Temp 97.8°F | Ht 61.5 in | Wt 168.5 lb

## 2019-05-06 DIAGNOSIS — L304 Erythema intertrigo: Secondary | ICD-10-CM | POA: Diagnosis not present

## 2019-05-06 DIAGNOSIS — Z8673 Personal history of transient ischemic attack (TIA), and cerebral infarction without residual deficits: Secondary | ICD-10-CM | POA: Diagnosis not present

## 2019-05-06 DIAGNOSIS — R609 Edema, unspecified: Secondary | ICD-10-CM | POA: Diagnosis not present

## 2019-05-06 DIAGNOSIS — H04123 Dry eye syndrome of bilateral lacrimal glands: Secondary | ICD-10-CM | POA: Diagnosis not present

## 2019-05-06 DIAGNOSIS — G453 Amaurosis fugax: Secondary | ICD-10-CM | POA: Diagnosis not present

## 2019-05-06 DIAGNOSIS — G629 Polyneuropathy, unspecified: Secondary | ICD-10-CM

## 2019-05-06 DIAGNOSIS — H40013 Open angle with borderline findings, low risk, bilateral: Secondary | ICD-10-CM | POA: Diagnosis not present

## 2019-05-06 MED ORDER — GABAPENTIN 100 MG PO CAPS: 100.0000 mg | ORAL_CAPSULE | Freq: Every day | ORAL | 3 refills | Status: DC

## 2019-05-06 MED ORDER — NYSTATIN 100000 UNIT/GM EX CREA: 1.0000 "application " | TOPICAL_CREAM | Freq: Two times a day (BID) | CUTANEOUS | 1 refills | Status: DC

## 2019-05-06 NOTE — Patient Instructions (Signed)
For foot burning pain - I think it's coming from neuropathy. Labs today, may try gabapentin 100mg  at bedtime for nerve pain. For rash - it seems to be improving with antifungal powder. Continue this in the mornings after shower, may use antifungal cream sent to pharmacy in evenings prior to bedtime.  Ensure areas stay clean and dry, pat dry after showers  Intertrigo Intertrigo is skin irritation or inflammation (dermatitis) that occurs when folds of skin rub together. The irritation can cause a rash and make skin raw and itchy. This condition most commonly occurs in the skin folds of these areas:  Toes.  Armpits.  Groin.  Under the belly.  Under the breasts.  Buttocks. Intertrigo is not passed from person to person (is not contagious). What are the causes? This condition is caused by heat, moisture, rubbing (friction), and not enough air circulation. The condition can be made worse by:  Sweat.  Bacteria.  A fungus, such as yeast. What increases the risk? This condition is more likely to occur if you have moisture in your skin folds. You are more likely to develop this condition if you:  Have diabetes.  Are overweight.  Are not able to move around or are not active.  Live in a warm and moist climate.  Wear splints, braces, or other medical devices.  Are not able to control your bowels or bladder (have incontinence). What are the signs or symptoms? Symptoms of this condition include:  A pink or red skin rash in the skin fold or near the skin fold.  Raw or scaly skin.  Itchiness.  A burning feeling.  Bleeding.  Leaking fluid.  A bad smell. How is this diagnosed? This condition is diagnosed with a medical history and physical exam. You may also have a skin swab to test for bacteria or a fungus. How is this treated? This condition may be treated by:  Cleaning and drying your skin.  Taking an antibiotic medicine or using an antibiotic skin cream for a  bacterial infection.  Using an antifungal cream on your skin or taking pills for an infection that was caused by a fungus, such as yeast.  Using a steroid ointment to relieve itchiness and irritation.  Separating the skin fold with a clean cotton cloth to absorb moisture and allow air to flow into the area. Follow these instructions at home:  Keep the affected area clean and dry.  Do not scratch your skin.  Stay in a cool environment as much as possible. Use an air conditioner or fan, if available.  Apply over-the-counter and prescription medicines only as told by your health care provider.  If you were prescribed an antibiotic medicine, use it as told by your health care provider. Do not stop using the antibiotic even if your condition improves.  Keep all follow-up visits as told by your health care provider. This is important. How is this prevented?   Maintain a healthy weight.  Take care of your feet, especially if you have diabetes. Foot care includes: ? Wearing shoes that fit well. ? Keeping your feet dry. ? Wearing clean, breathable socks.  Protect the skin around your groin and buttocks, especially if you have incontinence. Skin protection includes: ? Following a regular cleaning routine. ? Using skin protectant creams, powders, or ointments. ? Changing protection pads frequently.  Do not wear tight clothes. Wear clothes that are loose, absorbent, and made of cotton.  Wear a bra that gives good support, if needed.  Shower  and dry yourself well after activity or exercise. Use a hair dryer on a cool setting to dry between skin folds, especially after you bathe.  If you have diabetes, keep your blood sugar under control. Contact a health care provider if:  Your symptoms do not improve with treatment.  Your symptoms get worse or they spread.  You notice increased redness and warmth.  You have a fever. Summary  Intertrigo is skin irritation or inflammation  (dermatitis) that occurs when folds of skin rub together.  This condition is caused by heat, moisture, rubbing (friction), and not enough air circulation.  This condition may be treated by cleaning and drying your skin and with medicines.  Apply over-the-counter and prescription medicines only as told by your health care provider.  Keep all follow-up visits as told by your health care provider. This is important. This information is not intended to replace advice given to you by your health care provider. Make sure you discuss any questions you have with your health care provider. Document Released: 09/12/2005 Document Revised: 02/12/2018 Document Reviewed: 02/12/2018 Elsevier Patient Education  2020 [employer].

## 2019-05-06 NOTE — Assessment & Plan Note (Signed)
Describes neuropathic pain of bilateral feet at soles, worse at night and affecting sleep. Will check labs (BMP, B12, TSH). Not consistent with plantar fasciitis. Suggested trial low dose gabapentin 100mg  at night time. Update with effect.

## 2019-05-06 NOTE — Progress Notes (Signed)
This visit was conducted in person.  BP 126/62 (BP Location: Left Arm, Patient Position: Sitting, Cuff Size: Normal)   Pulse 84   Temp 97.8 F (36.6 C) (Temporal)   Ht 5' 1.5" (1.562 m)   Wt 168 lb 8 oz (76.4 kg)   SpO2 95%   BMI 31.32 kg/m    CC: foot swelling, rash  Subjective:    Patient ID: [name] McgregorHattie S Whyte, female    DOB: November 16, 1945, 73 y.o.   MRN: [id number]  HPI: [name] [name] is a 73 y.o. female presenting on 05/06/2019 for Foot Swelling (C/o bilateral foot swelling, cramping and burning.  H/o swelling.  Takes Lasix, no longer helping as well.   Pt accompanied by daughter, Clayborne Danaatti.) and Rash (C/o rash under breasts on on anterior thighs. )   Increasing bilateral foot swelling despite lasix 20mg  PRN, noted for a few months. Weight gain also noted. Noticing increasing burning of feet worse at night time as well as cramps and numbness of feet - also present throughout the day. Has been using aveeno moisturizing cream.  No alcohol. DM well controlled. Vit b12 was stopped due to high levels.  Slight increased dyspnea with exertion. No chest pain or dizziness or HA.   Rash under L breast as well as anterior R thigh. Treating with antifungal powder with some benefit (doesn't remember name of this).   H/o recurrent strokes.      Relevant past medical, surgical, family and social history reviewed and updated as indicated. Interim medical history since our last visit reviewed. Allergies and medications reviewed and updated. Outpatient Medications Prior to Visit  Medication Sig Dispense Refill  . acetaminophen (TYLENOL) 500 MG tablet Take 1 tablet (500 mg total) by mouth every 6 (six) hours as needed for headache.    . albuterol (PROVENTIL HFA;VENTOLIN HFA) 108 (90 Base) MCG/ACT inhaler Inhale 2 puffs into the lungs every 6 (six) hours as needed for wheezing or shortness of breath. 1 Inhaler 2  . aspirin (ASPIRIN 81) 81 MG EC tablet Take 1 tablet (81 mg total) by mouth daily.  Swallow whole.    [name]. atorvastatin (LIPITOR) 40 MG tablet TAKE 1 TABLET (40 MG TOTAL) BY MOUTH DAILY. 90 tablet 2  . benazepril (LOTENSIN) 5 MG tablet Take 1 tablet (5 mg total) by mouth daily. 30 tablet 3  . CALCIUM-VITAMIN D PO Take 1 tablet by mouth daily.    . Cholecalciferol (VITAMIN D3) 25 MCG (1000 UT) CAPS Take 1,000 Units by mouth daily.    . clopidogrel (PLAVIX) 75 MG tablet Take 1 tablet (75 mg total) by mouth daily. 90 tablet 1  . fluticasone-salmeterol (ADVAIR HFA) 115-21 MCG/ACT inhaler Inhale 2 puffs into the lungs 2 (two) times daily. Rinse mouth after use. 1 Inhaler 12  . furosemide (LASIX) 20 MG tablet Take 1 tablet (20 mg total) by mouth as needed for fluid or edema. May take once or twice a week for swelling 40 tablet 1  . levothyroxine (SYNTHROID) 50 MCG tablet TAKE 1 TABLET  DAILY BEFORE BREAKFAST. 90 tablet 1  . Multiple Vitamins-Minerals (MULTIVITAMIN PO) Take 1 tablet by mouth daily.    . OXYGEN Inhale 2 L into the lungs at bedtime.    . pantoprazole (PROTONIX) 40 MG tablet Take 1 tablet (40 mg total) by mouth daily. For 2 weeks then as needed 30 tablet 3  . Potassium Chloride ER 20 MEQ TBCR TAKE 2 TABLETS BY MOUTH AS NEEDED (WHENEVER  YOU  TAKE  LASIX) 80 tablet 1  . sertraline (ZOLOFT) 100 MG tablet Take 1 tablet (100 mg total) by mouth daily. 90 tablet 3  . traMADol (ULTRAM) 50 MG tablet Take 1-2 tablets (50-100 mg total) by mouth 3 (three) times daily as needed for moderate pain. 30 tablet 0  . traZODone (DESYREL) 100 MG tablet Take 1 tablet (100 mg total) by mouth at bedtime. (Patient taking differently: Take 100 mg by mouth at bedtime as needed for sleep. ) 90 tablet 3  . ciprofloxacin (CIPRO) 250 MG tablet Take 1 tablet (250 mg total) by mouth 2 (two) times daily. 14 tablet 0  . fexofenadine (ALLEGRA) 180 MG tablet Take 180 mg by mouth daily as needed for allergies.      No facility-administered medications prior to visit.      Per HPI unless specifically indicated  in ROS section below Review of Systems Objective:    BP 126/62 (BP Location: Left Arm, Patient Position: Sitting, Cuff Size: Normal)   Pulse 84   Temp 97.8 F (36.6 C) (Temporal)   Ht 5' 1.5" (1.562 m)   Wt 168 lb 8 oz (76.4 kg)   SpO2 95%   BMI 31.32 kg/m   Wt Readings from Last 3 Encounters:  05/06/19 168 lb 8 oz (76.4 kg)  03/04/19 161 lb (73 kg)  01/29/19 161 lb (73 kg)    Physical Exam Vitals signs and nursing note reviewed.  Constitutional:      General: She is not in acute distress.    Appearance: Normal appearance. She is not ill-appearing.  Cardiovascular:     Rate and Rhythm: Normal rate and regular rhythm.     Pulses: Normal pulses.     Heart sounds: Normal heart sounds. No murmur.  Pulmonary:     Effort: Pulmonary effort is normal. No respiratory distress.     Breath sounds: Normal breath sounds. No wheezing, rhonchi or rales.  Musculoskeletal:        General: No swelling or tenderness.     Right lower leg: No edema.     Left lower leg: No edema.     Comments: No significant pedal edema, no maceration, no pain at heel or achilles tendon  Skin:    General: Skin is warm and dry.     Findings: Erythema and rash present.     Comments: Erythematous rash under L breast as well as R groin at skin folds, covered in powder. Improving maceration. No erythema or drainage  Neurological:     Mental Status: She is alert.  Psychiatric:        Mood and Affect: Mood normal.        Behavior: Behavior normal.       Lab Results  Component Value Date   TSH 2.64 06/27/2018   Lab Results  Component Value Date   VITAMINB12 1,351 (H) 01/30/2018   Lab Results  Component Value Date   CREATININE 1.01 (H) 01/25/2019   BUN 22 01/25/2019   NA 138 01/25/2019   K 4.6 01/25/2019   CL 105 01/25/2019   CO2 23 01/25/2019    Lab Results  Component Value Date   HGBA1C 5.1 07/31/2018    Assessment & Plan:   Problem List Items Addressed This Visit    Peripheral edema     Actually no significant pedal edema noted today. If ongoing, consider increased lasix dose (currently taking 1/2 tab daily without benefit). Check BMP today.      Neuropathy - Primary  Describes neuropathic pain of bilateral feet at soles, worse at night and affecting sleep. Will check labs (BMP, B12, TSH). Not consistent with plantar fasciitis. Suggested trial low dose gabapentin 100mg  at night time. Update with effect.       Relevant Orders   Vitamin B12   TSH   Basic metabolic panel   Intertrigo    Anticipate candidal - rash is actually improving with antifungal powder - rec continue with this treatment. Will add nystatin cream at night. Discussed importance of clean, dry skin.       History of lacunar cerebrovascular accident (CVA)       Meds ordered this encounter  Medications  . gabapentin (NEURONTIN) 100 MG capsule    Sig: Take 1 capsule (100 mg total) by mouth at bedtime.    Dispense:  30 capsule    Refill:  3  . nystatin cream (MYCOSTATIN)    Sig: Apply 1 application topically 2 (two) times daily.    Dispense:  80 g    Refill:  1   Orders Placed This Encounter  Procedures  . Vitamin B12  . TSH  . Basic metabolic panel    Follow up plan: No follow-ups on file.  Ria Bush, MD

## 2019-05-06 NOTE — Assessment & Plan Note (Signed)
Actually no significant pedal edema noted today. If ongoing, consider increased lasix dose (currently taking 1/2 tab daily without benefit). Check BMP today.

## 2019-05-06 NOTE — Assessment & Plan Note (Signed)
Anticipate candidal - rash is actually improving with antifungal powder - rec continue with this treatment. Will add nystatin cream at night. Discussed importance of clean, dry skin.

## 2019-05-07 LAB — BASIC METABOLIC PANEL
BUN: 17 mg/dL (ref 6–23)
CO2: 27 mEq/L (ref 19–32)
Calcium: 9.3 mg/dL (ref 8.4–10.5)
Chloride: 103 mEq/L (ref 96–112)
Creatinine, Ser: 1 mg/dL (ref 0.40–1.20)
GFR: 54.4 mL/min — ABNORMAL LOW (ref >60.00)
Glucose, Bld: 141 mg/dL — ABNORMAL HIGH (ref 70–99)
Potassium: 4.5 mEq/L (ref 3.5–5.1)
Sodium: 139 mEq/L (ref 135–145)

## 2019-05-07 LAB — VITAMIN B12: Vitamin B-12: 780 pg/mL (ref 211–911)

## 2019-05-07 LAB — TSH: TSH: 2.3 u[IU]/mL (ref 0.35–4.50)

## 2019-05-08 ENCOUNTER — Other Ambulatory Visit: Payer: Self-pay | Admitting: Family Medicine

## 2019-05-09 NOTE — Telephone Encounter (Signed)
Plavix Last filled:  03/18/19, #90 Last OV:  05/06/19, acute Next OV:  07/08/19, CPE Prt 2

## 2019-05-17 ENCOUNTER — Other Ambulatory Visit: Payer: Self-pay | Admitting: Family Medicine

## 2019-05-17 NOTE — Telephone Encounter (Signed)
Name of Medication: Tramadol Name of [facility]acy: [facility] Last Fill or Written Date and Quantity: 05/07/18, #30 Last Office Visit and Type: 05/06/19, acute Next Office Visit and Type: 07/08/19, CPE Prt 2 Last Controlled Substance Agreement Date: none Last UDS: none

## 2019-05-17 NOTE — Telephone Encounter (Signed)
Eprescribed.

## 2019-05-18 DIAGNOSIS — R269 Unspecified abnormalities of gait and mobility: Secondary | ICD-10-CM | POA: Diagnosis not present

## 2019-05-18 DIAGNOSIS — J449 Chronic obstructive pulmonary disease, unspecified: Secondary | ICD-10-CM | POA: Diagnosis not present

## 2019-06-04 ENCOUNTER — Telehealth: Payer: Self-pay | Admitting: *Deleted

## 2019-06-04 MED ORDER — FUROSEMIDE 40 MG PO TABS: 40.0000 mg | ORAL_TABLET | Freq: Every day | ORAL | 1 refills | Status: DC | PRN

## 2019-06-04 NOTE — Telephone Encounter (Signed)
Spoke with daughter - pt has been taking lasix 20mg  daily and is fully out. Not great benefit at this dose. Ongoing leg swelling. Tried 40mg  x1 with marked benefit. Will increase to lasix 40mg  to take PRN (a few times a week). labwork scheduled for next month already.

## 2019-06-04 NOTE — Telephone Encounter (Signed)
Patient's [name] left a voicemail stating that her mom is taking Lasix for edema/swelling. [name] stated that the medication is helping some, but not doing enough to help with the swelling. [name] wants to know if they can get a refill, but possibly a higher dose since the dose that she is on does not seem to do a very good job? [name] stated that she can bring her mom back in if she needs to and requested a call back. Pharmacy [facility]

## 2019-06-10 ENCOUNTER — Telehealth (INDEPENDENT_AMBULATORY_CARE_PROVIDER_SITE_OTHER): Payer: Medicare HMO | Admitting: Family Medicine

## 2019-06-10 ENCOUNTER — Other Ambulatory Visit (INDEPENDENT_AMBULATORY_CARE_PROVIDER_SITE_OTHER): Payer: Medicare HMO

## 2019-06-10 ENCOUNTER — Other Ambulatory Visit: Payer: Self-pay

## 2019-06-10 DIAGNOSIS — R1030 Lower abdominal pain, unspecified: Secondary | ICD-10-CM | POA: Diagnosis not present

## 2019-06-10 DIAGNOSIS — R300 Dysuria: Secondary | ICD-10-CM

## 2019-06-10 LAB — POC URINALSYSI DIPSTICK (AUTOMATED)
Bilirubin, UA: NEGATIVE
Glucose, UA: NEGATIVE
Ketones, UA: NEGATIVE
Nitrite, UA: NEGATIVE
Protein, UA: NEGATIVE
Spec Grav, UA: 1.02 (ref 1.010–1.025)
Urobilinogen, UA: 0.2 E.U./dL
pH, UA: 5 (ref 5.0–8.0)

## 2019-06-10 MED ORDER — CIPROFLOXACIN HCL 250 MG PO TABS: 250.0000 mg | ORAL_TABLET | Freq: Two times a day (BID) | ORAL | 0 refills | Status: DC

## 2019-06-10 NOTE — Telephone Encounter (Signed)
UA suspicious for infection. Not enough to spin. I have sent in cipro 250mg  BID x 7d course.  May start abx tonight, will review details tomorrow at [place].

## 2019-06-10 NOTE — Telephone Encounter (Signed)
Recommend they drop off urine sample for UA and culture.  Would offer virtual visit tomorrow if they can do this.

## 2019-06-10 NOTE — Telephone Encounter (Signed)
Patient's [name] called today in regards to the patient having another UTI. She stated in the past they have been able to just bring a urine sample to the office to be tested and something be prescribed. [name] would like to know if they could drop a sample off or do they need to schedule another appointment,  At this time we don not have any openings in the office

## 2019-06-10 NOTE — Addendum Note (Signed)
Addended by: Ria Bush on: 06/10/2019 05:02 PM   Modules accepted: Orders

## 2019-06-10 NOTE — Addendum Note (Signed)
Addended by: Cloyd Stagers on: 06/10/2019 04:51 PM   Modules accepted: Orders

## 2019-06-10 NOTE — Telephone Encounter (Signed)
Ordered ucx, per Dr. Darnell Level.

## 2019-06-10 NOTE — Telephone Encounter (Signed)
Urine sample dropped off.  Only had enough to dip and draw up for ucx.  Documented results.

## 2019-06-10 NOTE — Addendum Note (Signed)
Addended by: Brenton Grills on: 3/75/4360 67:70 PM   Modules accepted: Orders

## 2019-06-10 NOTE — Telephone Encounter (Addendum)
Spoke with pt's daughter, [name] (on dpr), asking about pt's sxs.  Says pt is c/o low abd pain and urine has strong odor.  I relayed Dr. Synthia Innocent message.   Pt scheduled for virtual visit tomorrow at 10:00.  [name] will bring pt's urine sample today.

## 2019-06-10 NOTE — Addendum Note (Signed)
Addended by: Brenton Grills on: 11/18/3610 24:49 PM   Modules accepted: Orders

## 2019-06-11 ENCOUNTER — Ambulatory Visit (INDEPENDENT_AMBULATORY_CARE_PROVIDER_SITE_OTHER): Payer: Medicare HMO | Admitting: Family Medicine

## 2019-06-11 ENCOUNTER — Encounter: Payer: Self-pay | Admitting: Family Medicine

## 2019-06-11 VITALS — BP 109/54 | HR 73 | Temp 97.6°F | Ht 61.5 in | Wt 161.5 lb

## 2019-06-11 DIAGNOSIS — N3000 Acute cystitis without hematuria: Secondary | ICD-10-CM

## 2019-06-11 NOTE — Telephone Encounter (Addendum)
Spoke with pt's daughter, [name], relaying Dr. Synthia Innocent message.  Verbalizes understanding.

## 2019-06-11 NOTE — Assessment & Plan Note (Signed)
Symptoms, UA consistent with infection. Not enough urine to micro. UCx pending. To start cipro 500mg  BID x 1 wk. Reviewed preventative measures to treat UTIs.  3rd culture confirmed UTI this year, 4th treated UTI this year. Will refer to urology for frequent UTIs.

## 2019-06-11 NOTE — Progress Notes (Signed)
Virtual visit completed through [employer]. Due to national recommendations of social distancing due to COVID-19, a virtual visit is felt to be most appropriate for this patient at this time. Reviewed limitations of a virtual visit.   Patient location: home, daughter [name] [name] also present on call Provider location: [place] [facility], office If any vitals were documented, they were collected by patient at home unless specified below.    BP (!) 109/54   Pulse 73   Temp 97.6 F (36.4 C)   Ht 5' 1.5" (1.562 m)   Wt 161 lb 8 oz (73.3 kg)   BMI 30.02 kg/m    CC: UTI Subjective:    Patient ID: [name] [name], female    DOB: 1945-09-30, 73 y.o.   MRN: [id number]  HPI: [name] [name] is a 73 y.o. female presenting on 06/11/2019 for Dysuria (C/o burning with urination and strong urine odor.  Sxs started 06/06/19. )   See yesterday's phone note for details.  Lower abd pain associated with dysuria, urgency and frequency and strong odor in urine.  No fevers/chills, flank pain, nausea/vomiting. No hematuria.   UCx pending.  Lasix recently increased to 40mg , twice a week.  She avoids soft drinks but drinks green tea.   Treated for UTI with keflex 01/24/2019, UCx >100k pansens E coli Treated for UTI with keflex 03/04/2019, UCx >100k Proteus  Last treated for UTI with cipro 03/19/2019, UCx without significant growth (persistent symptoms after prior UTI).    Relevant past medical, surgical, family and social history reviewed and updated as indicated. Interim medical history since our last visit reviewed. Allergies and medications reviewed and updated. Outpatient Medications Prior to Visit  Medication Sig Dispense Refill  . acetaminophen (TYLENOL) 500 MG tablet Take 1 tablet (500 mg total) by mouth every 6 (six) hours as needed for headache.    . albuterol (PROVENTIL HFA;VENTOLIN HFA) 108 (90 Base) MCG/ACT inhaler Inhale 2 puffs into the lungs every 6 (six) hours as needed for wheezing or  shortness of breath. 1 Inhaler 2  . aspirin (ASPIRIN 81) 81 MG EC tablet Take 1 tablet (81 mg total) by mouth daily. Swallow whole.    [name] atorvastatin (LIPITOR) 40 MG tablet TAKE 1 TABLET (40 MG TOTAL) BY MOUTH DAILY. 90 tablet 2  . benazepril (LOTENSIN) 5 MG tablet Take 1 tablet (5 mg total) by mouth daily. 30 tablet 3  . CALCIUM-VITAMIN D PO Take 1 tablet by mouth daily.    . Cholecalciferol (VITAMIN D3) 25 MCG (1000 UT) CAPS Take 1,000 Units by mouth daily.    . ciprofloxacin (CIPRO) 250 MG tablet Take 1 tablet (250 mg total) by mouth 2 (two) times daily. 14 tablet 0  . clopidogrel (PLAVIX) 75 MG tablet TAKE 1 TABLET (75 MG TOTAL) BY MOUTH DAILY. 90 tablet 1  . fluticasone-salmeterol (ADVAIR HFA) 115-21 MCG/ACT inhaler Inhale 2 puffs into the lungs 2 (two) times daily. Rinse mouth after use. 1 Inhaler 12  . furosemide (LASIX) 40 MG tablet Take 1 tablet (40 mg total) by mouth daily as needed for fluid or edema. May take once or twice a week for swelling 30 tablet 1  . gabapentin (NEURONTIN) 100 MG capsule Take 1 capsule (100 mg total) by mouth at bedtime. 30 capsule 3  . levothyroxine (SYNTHROID) 50 MCG tablet TAKE 1 TABLET  DAILY BEFORE BREAKFAST. 90 tablet 1  . Multiple Vitamins-Minerals (MULTIVITAMIN PO) Take 1 tablet by mouth daily.    [name] nystatin cream (MYCOSTATIN) Apply  1 application topically 2 (two) times daily. 80 g 1  . OXYGEN Inhale 2 L into the lungs at bedtime.    . pantoprazole (PROTONIX) 40 MG tablet Take 1 tablet (40 mg total) by mouth daily. For 2 weeks then as needed 30 tablet 3  . Potassium Chloride ER 20 MEQ TBCR TAKE 2 TABLETS BY MOUTH AS NEEDED (WHENEVER  YOU  TAKE  LASIX) 80 tablet 1  . sertraline (ZOLOFT) 100 MG tablet Take 1 tablet (100 mg total) by mouth daily. 90 tablet 3  . traMADol (ULTRAM) 50 MG tablet TAKE 1 TO 2 TABLETS BY MOUTH THREE TIMES DAILY AS NEEDED FOR MODERATE PAIN 30 tablet 0  . traZODone (DESYREL) 100 MG tablet Take 1 tablet (100 mg total) by mouth at  bedtime. (Patient taking differently: Take 100 mg by mouth at bedtime as needed for sleep. ) 90 tablet 3   No facility-administered medications prior to visit.      Per HPI unless specifically indicated in ROS section below Review of Systems Objective:    BP (!) 109/54   Pulse 73   Temp 97.6 F (36.4 C)   Ht 5' 1.5" (1.562 m)   Wt 161 lb 8 oz (73.3 kg)   BMI 30.02 kg/m   Wt Readings from Last 3 Encounters:  06/11/19 161 lb 8 oz (73.3 kg)  05/06/19 168 lb 8 oz (76.4 kg)  03/04/19 161 lb (73 kg)     Physical exam: Gen: alert, NAD, not ill appearing Pulm: speaks in complete sentences without increased work of breathing Psych: normal mood, normal thought content      Results for orders placed or performed in visit on 06/10/19  POCT Urinalysis Dipstick (Automated)  Result Value Ref Range   Color, UA light yellow    Clarity, UA clear    Glucose, UA Negative Negative   Bilirubin, UA negative    Ketones, UA negative    Spec Grav, UA 1.020 1.010 - 1.025   Blood, UA +/-    pH, UA 5.0 5.0 - 8.0   Protein, UA Negative Negative   Urobilinogen, UA 0.2 0.2 or 1.0 E.U./dL   Nitrite, UA negative    Leukocytes, UA Large (3+) (A) Negative   Assessment & Plan:   Problem List Items Addressed This Visit    UTI (urinary tract infection) - Primary    Symptoms, UA consistent with infection. Not enough urine to micro. UCx pending. To start cipro 500mg  BID x 1 wk. Reviewed preventative measures to treat UTIs.  3rd culture confirmed UTI this year, 4th treated UTI this year. Will refer to urology for frequent UTIs.       Relevant Orders   Ambulatory referral to Urology       No orders of the defined types were placed in this encounter.  Orders Placed This Encounter  Procedures  . Ambulatory referral to Urology    Referral Priority:   Routine    Referral Type:   Consultation    Referral Reason:   Specialty Services Required    Requested Specialty:   Urology    Number of Visits  Requested:   1    I discussed the assessment and treatment plan with the patient. The patient was provided an opportunity to ask questions and all were answered. The patient agreed with the plan and demonstrated an understanding of the instructions. The patient was advised to call back or seek an in-person evaluation if the symptoms worsen or if the  condition fails to improve as anticipated.  Follow up plan: No follow-ups on file.  [name], MD

## 2019-06-12 LAB — URINE CULTURE
MICRO NUMBER:: 877640
SPECIMEN QUALITY:: ADEQUATE

## 2019-06-18 DIAGNOSIS — J449 Chronic obstructive pulmonary disease, unspecified: Secondary | ICD-10-CM | POA: Diagnosis not present

## 2019-06-18 DIAGNOSIS — R269 Unspecified abnormalities of gait and mobility: Secondary | ICD-10-CM | POA: Diagnosis not present

## 2019-06-20 ENCOUNTER — Other Ambulatory Visit: Payer: Self-pay | Admitting: Family Medicine

## 2019-06-21 ENCOUNTER — Other Ambulatory Visit: Payer: Self-pay

## 2019-06-21 MED ORDER — PANTOPRAZOLE SODIUM 40 MG PO TBEC: 40.0000 mg | DELAYED_RELEASE_TABLET | Freq: Every day | ORAL | 2 refills | Status: DC

## 2019-06-21 MED ORDER — TRAMADOL HCL 50 MG PO TABS: ORAL_TABLET | ORAL | 0 refills | Status: DC

## 2019-06-21 MED ORDER — GABAPENTIN 100 MG PO CAPS: 100.0000 mg | ORAL_CAPSULE | Freq: Every day | ORAL | 1 refills | Status: DC

## 2019-06-21 MED ORDER — POTASSIUM CHLORIDE ER 20 MEQ PO TBCR: EXTENDED_RELEASE_TABLET | ORAL | 1 refills | Status: DC

## 2019-06-21 MED ORDER — FUROSEMIDE 40 MG PO TABS: 40.0000 mg | ORAL_TABLET | Freq: Every day | ORAL | 1 refills | Status: DC | PRN

## 2019-06-21 NOTE — Telephone Encounter (Signed)
Received refill requests from [facility]. Spoke with patient to make sure she did want to use mail order and not local pharmacy (some refills have went to local pharmacy before). Patient wanted to switch to [facility].  Last refilled to [facility]:  Gabapentin 05/06/2019 #30 with 3 refill.     Potassium 04/24/2019 #80 with 1 refill     Furosemide 06/04/2019 #30 with 1 refill     Tramadol 05/17/2019 #30 with 0 refill.  LOV 06/11/2019 Cystitis Next appointment on 07/08/2019 for CPE Please review.

## 2019-06-21 NOTE — Telephone Encounter (Signed)
Most meds sent to mail order Recommend tramadol to local pharmacy as it's controlled substance.

## 2019-06-24 ENCOUNTER — Other Ambulatory Visit: Payer: Self-pay

## 2019-06-24 MED ORDER — FUROSEMIDE 40 MG PO TABS: 40.0000 mg | ORAL_TABLET | Freq: Every day | ORAL | 1 refills | Status: DC | PRN

## 2019-06-24 NOTE — Telephone Encounter (Signed)
Patient advised.

## 2019-07-01 ENCOUNTER — Other Ambulatory Visit: Payer: Self-pay | Admitting: Family Medicine

## 2019-07-01 ENCOUNTER — Other Ambulatory Visit (INDEPENDENT_AMBULATORY_CARE_PROVIDER_SITE_OTHER): Payer: Medicare HMO

## 2019-07-01 DIAGNOSIS — E785 Hyperlipidemia, unspecified: Secondary | ICD-10-CM

## 2019-07-01 DIAGNOSIS — E559 Vitamin D deficiency, unspecified: Secondary | ICD-10-CM | POA: Diagnosis not present

## 2019-07-01 DIAGNOSIS — N183 Chronic kidney disease, stage 3 unspecified: Secondary | ICD-10-CM | POA: Diagnosis not present

## 2019-07-01 DIAGNOSIS — E039 Hypothyroidism, unspecified: Secondary | ICD-10-CM

## 2019-07-01 LAB — VITAMIN D 25 HYDROXY (VIT D DEFICIENCY, FRACTURES): VITD: 37.5 ng/mL (ref 30.00–100.00)

## 2019-07-01 LAB — COMPREHENSIVE METABOLIC PANEL
ALT: 14 U/L (ref 0–35)
AST: 21 U/L (ref 0–37)
Albumin: 4 g/dL (ref 3.5–5.2)
Alkaline Phosphatase: 117 U/L (ref 39–117)
BUN: 19 mg/dL (ref 6–23)
CO2: 27 mEq/L (ref 19–32)
Calcium: 9.1 mg/dL (ref 8.4–10.5)
Chloride: 104 mEq/L (ref 96–112)
Creatinine, Ser: 0.95 mg/dL (ref 0.40–1.20)
GFR: 57.7 mL/min — ABNORMAL LOW (ref >60.00)
Glucose, Bld: 95 mg/dL (ref 70–99)
Potassium: 4.1 mEq/L (ref 3.5–5.1)
Sodium: 139 mEq/L (ref 135–145)
Total Bilirubin: 0.4 mg/dL (ref 0.2–1.2)
Total Protein: 7 g/dL (ref 6.0–8.3)

## 2019-07-01 LAB — CBC WITH DIFFERENTIAL/PLATELET
Basophils Absolute: 0.1 10*3/uL (ref 0.0–0.1)
Basophils Relative: 1.3 % (ref 0.0–3.0)
Eosinophils Absolute: 0.2 10*3/uL (ref 0.0–0.7)
Eosinophils Relative: 3.2 % (ref 0.0–5.0)
HCT: 38.4 % (ref 36.0–46.0)
Hemoglobin: 12.5 g/dL (ref 12.0–15.0)
Lymphocytes Relative: 26.2 % (ref 12.0–46.0)
Lymphs Abs: 1.5 10*3/uL (ref 0.7–4.0)
MCHC: 32.5 g/dL (ref 30.0–36.0)
MCV: 92.7 fl (ref 78.0–100.0)
Monocytes Absolute: 0.5 10*3/uL (ref 0.1–1.0)
Monocytes Relative: 9.4 % (ref 3.0–12.0)
Neutro Abs: 3.5 10*3/uL (ref 1.4–7.7)
Neutrophils Relative %: 59.9 % (ref 43.0–77.0)
Platelets: 169 10*3/uL (ref 150.0–400.0)
RBC: 4.14 Mil/uL (ref 3.87–5.11)
RDW: 14.1 % (ref 11.5–15.5)
WBC: 5.8 10*3/uL (ref 4.0–10.5)

## 2019-07-01 LAB — LIPID PANEL
Cholesterol: 237 mg/dL — ABNORMAL HIGH (ref 0–200)
HDL: 65.5 mg/dL (ref >39.00)
LDL Cholesterol: 157 mg/dL — ABNORMAL HIGH (ref 0–99)
NonHDL: 171.9
Total CHOL/HDL Ratio: 4
Triglycerides: 75 mg/dL (ref 0.0–149.0)
VLDL: 15 mg/dL (ref 0.0–40.0)

## 2019-07-08 ENCOUNTER — Ambulatory Visit: Payer: Medicare HMO

## 2019-07-08 ENCOUNTER — Other Ambulatory Visit: Payer: Self-pay

## 2019-07-08 ENCOUNTER — Encounter: Payer: Self-pay | Admitting: Family Medicine

## 2019-07-08 ENCOUNTER — Ambulatory Visit (INDEPENDENT_AMBULATORY_CARE_PROVIDER_SITE_OTHER): Payer: Medicare HMO | Admitting: Family Medicine

## 2019-07-08 VITALS — BP 122/74 | HR 82 | Temp 97.8°F | Ht 59.5 in | Wt 171.0 lb

## 2019-07-08 DIAGNOSIS — E039 Hypothyroidism, unspecified: Secondary | ICD-10-CM

## 2019-07-08 DIAGNOSIS — F331 Major depressive disorder, recurrent, moderate: Secondary | ICD-10-CM | POA: Diagnosis not present

## 2019-07-08 DIAGNOSIS — Z0000 Encounter for general adult medical examination without abnormal findings: Secondary | ICD-10-CM | POA: Diagnosis not present

## 2019-07-08 DIAGNOSIS — N3000 Acute cystitis without hematuria: Secondary | ICD-10-CM

## 2019-07-08 DIAGNOSIS — I6523 Occlusion and stenosis of bilateral carotid arteries: Secondary | ICD-10-CM

## 2019-07-08 DIAGNOSIS — G629 Polyneuropathy, unspecified: Secondary | ICD-10-CM

## 2019-07-08 DIAGNOSIS — Z23 Encounter for immunization: Secondary | ICD-10-CM | POA: Diagnosis not present

## 2019-07-08 DIAGNOSIS — N183 Chronic kidney disease, stage 3 unspecified: Secondary | ICD-10-CM | POA: Diagnosis not present

## 2019-07-08 DIAGNOSIS — E559 Vitamin D deficiency, unspecified: Secondary | ICD-10-CM

## 2019-07-08 DIAGNOSIS — Z0001 Encounter for general adult medical examination with abnormal findings: Secondary | ICD-10-CM

## 2019-07-08 DIAGNOSIS — J449 Chronic obstructive pulmonary disease, unspecified: Secondary | ICD-10-CM | POA: Diagnosis not present

## 2019-07-08 DIAGNOSIS — I69351 Hemiplegia and hemiparesis following cerebral infarction affecting right dominant side: Secondary | ICD-10-CM | POA: Diagnosis not present

## 2019-07-08 DIAGNOSIS — H6122 Impacted cerumen, left ear: Secondary | ICD-10-CM

## 2019-07-08 DIAGNOSIS — J9611 Chronic respiratory failure with hypoxia: Secondary | ICD-10-CM | POA: Diagnosis not present

## 2019-07-08 DIAGNOSIS — I5032 Chronic diastolic (congestive) heart failure: Secondary | ICD-10-CM

## 2019-07-08 DIAGNOSIS — Z87891 Personal history of nicotine dependence: Secondary | ICD-10-CM

## 2019-07-08 DIAGNOSIS — I10 Essential (primary) hypertension: Secondary | ICD-10-CM

## 2019-07-08 DIAGNOSIS — J432 Centrilobular emphysema: Secondary | ICD-10-CM

## 2019-07-08 DIAGNOSIS — Z7189 Other specified counseling: Secondary | ICD-10-CM

## 2019-07-08 DIAGNOSIS — N1831 Chronic kidney disease, stage 3a: Secondary | ICD-10-CM

## 2019-07-08 DIAGNOSIS — R251 Tremor, unspecified: Secondary | ICD-10-CM

## 2019-07-08 DIAGNOSIS — M810 Age-related osteoporosis without current pathological fracture: Secondary | ICD-10-CM

## 2019-07-08 DIAGNOSIS — E782 Mixed hyperlipidemia: Secondary | ICD-10-CM

## 2019-07-08 DIAGNOSIS — Z8673 Personal history of transient ischemic attack (TIA), and cerebral infarction without residual deficits: Secondary | ICD-10-CM

## 2019-07-08 MED ORDER — NYSTATIN 100000 UNIT/GM EX CREA: 1.0000 "application " | TOPICAL_CREAM | Freq: Two times a day (BID) | CUTANEOUS | 1 refills | Status: DC

## 2019-07-08 NOTE — Assessment & Plan Note (Addendum)
Continues fosamax weekly since 2013. Prolia was unaffordable. Discussed calcium supplements vs calcium in diet. Continue vit D. Try to get most calcium from diet.

## 2019-07-08 NOTE — Assessment & Plan Note (Signed)
Chronic, stable 

## 2019-07-08 NOTE — Assessment & Plan Note (Signed)

## 2019-07-08 NOTE — Assessment & Plan Note (Signed)
Chronic, stable. Continue current regimen. 

## 2019-07-08 NOTE — Assessment & Plan Note (Signed)
Chronic, deteriorated. She thinks she's taking statin. Will verify at home. If she is, consider adding zetia. Goal LDL would be <70 if able to tolerate.  The ASCVD Risk score Mikey Bussing DC Jr., et al., 2013) failed to calculate for the following reasons:   The patient has a prior MI or stroke diagnosis

## 2019-07-08 NOTE — Patient Instructions (Addendum)
Flu shot today Call [facility] to reschedule mammogram.  [facility] to see if you're due for repeat CT for lung cancer screening.  Try to increase calcium in the diet, if you do well with this, then stop calcium supplement tablet (as the tablet may contribute to calcium buildup in arteries).  If interested, check with pharmacy about new 2 shot shingles series (shingrix).  Cholesterol levels were much higher today - ensure you're taking lipitor (atorvastatin) 40mg  daliy, let me know what you find at home. If you are taking, we may need to add second cholesterol medicine.  Irrigation of left ear performed today. You are doing well today. Return in 6 months for follow up visit.   Health Maintenance After Age 68 After age 72, you are at a higher risk for certain long-term diseases and infections as well as injuries from falls. Falls are a major cause of broken bones and head injuries in people who are older than age 33. Getting regular preventive care can help to keep you healthy and well. Preventive care includes getting regular testing and making lifestyle changes as recommended by your health care provider. Talk with your health care provider about:  Which screenings and tests you should have. A screening is a test that checks for a disease when you have no symptoms.  A diet and exercise plan that is right for you. What should I know about screenings and tests to prevent falls? Screening and testing are the best ways to find a health problem early. Early diagnosis and treatment give you the best chance of managing medical conditions that are common after age 56. Certain conditions and lifestyle choices may make you more likely to have a fall. Your health care provider may recommend:  Regular vision checks. Poor vision and conditions such as cataracts can make you more likely to have a fall. If you wear glasses, make sure to get your prescription updated if your vision  changes.  Medicine review. Work with your health care provider to regularly review all of the medicines you are taking, including over-the-counter medicines. Ask your health care provider about any side effects that may make you more likely to have a fall. Tell your health care provider if any medicines that you take make you feel dizzy or sleepy.  Osteoporosis screening. Osteoporosis is a condition that causes the bones to get weaker. This can make the bones weak and cause them to break more easily.  Blood pressure screening. Blood pressure changes and medicines to control blood pressure can make you feel dizzy.  Strength and balance checks. Your health care provider may recommend certain tests to check your strength and balance while standing, walking, or changing positions.  Foot health exam. Foot pain and numbness, as well as not wearing proper footwear, can make you more likely to have a fall.  Depression screening. You may be more likely to have a fall if you have a fear of falling, feel emotionally low, or feel unable to do activities that you used to do.  Alcohol use screening. Using too much alcohol can affect your balance and may make you more likely to have a fall. What actions can I take to lower my risk of falls? General instructions  Talk with your health care provider about your risks for falling. Tell your health care provider if: ? You fall. Be sure to tell your health care provider about all falls, even ones that seem minor. ? You feel dizzy, sleepy, or  off-balance.  Take over-the-counter and prescription medicines only as told by your health care provider. These include any supplements.  Eat a healthy diet and maintain a healthy weight. A healthy diet includes low-fat dairy products, low-fat (lean) meats, and fiber from whole grains, beans, and lots of fruits and vegetables. Home safety  Remove any tripping hazards, such as rugs, cords, and clutter.  Install safety  equipment such as grab bars in bathrooms and safety rails on stairs.  Keep rooms and walkways well-lit. Activity   Follow a regular exercise program to stay fit. This will help you maintain your balance. Ask your health care provider what types of exercise are appropriate for you.  If you need a cane or walker, use it as recommended by your health care provider.  Wear supportive shoes that have nonskid soles. Lifestyle  Do not drink alcohol if your health care provider tells you not to drink.  If you drink alcohol, limit how much you have: ? 0-1 drink a day for women. ? 0-2 drinks a day for men.  Be aware of how much alcohol is in your drink. In the [place] one drink equals one typical bottle of beer (12 oz), one-half glass of wine (5 oz), or one shot of hard liquor (1 oz).  Do not use any products that contain nicotine or tobacco, such as cigarettes and e-cigarettes. If you need help quitting, ask your health care provider. Summary  Having a healthy lifestyle and getting preventive care can help to protect your health and wellness after age 76.  Screening and testing are the best way to find a health problem early and help you avoid having a fall. Early diagnosis and treatment give you the best chance for managing medical conditions that are more common for people who are older than age 66.  Falls are a major cause of broken bones and head injuries in people who are older than age 17. Take precautions to prevent a fall at home.  Work with your health care provider to learn what changes you can make to improve your health and wellness and to prevent falls. This information is not intended to replace advice given to you by your health care provider. Make sure you discuss any questions you have with your health care provider. Document Released: 07/26/2017 Document Revised: 01/03/2019 Document Reviewed: 07/26/2017 Elsevier Patient Education  2020 [employer].

## 2019-07-08 NOTE — Assessment & Plan Note (Signed)
Stable period. No falls this year.

## 2019-07-08 NOTE — Assessment & Plan Note (Signed)
Continues nocturnal oxygen.

## 2019-07-08 NOTE — Assessment & Plan Note (Addendum)
Stable period on sertraline - consider lowering dose.

## 2019-07-08 NOTE — Assessment & Plan Note (Signed)
Levels repleted. Continue vit D 1000 IU daily.

## 2019-07-08 NOTE — Assessment & Plan Note (Signed)
Continue gabapentin at night which has been effective

## 2019-07-08 NOTE — Assessment & Plan Note (Signed)
Has not recently seen cards.

## 2019-07-08 NOTE — Assessment & Plan Note (Signed)
Preventative protocols reviewed and updated unless pt declined. Discussed healthy diet and lifestyle.  

## 2019-07-08 NOTE — Assessment & Plan Note (Signed)
Chronic, TFTs stable.  

## 2019-07-08 NOTE — Progress Notes (Signed)
This visit was conducted in person.  BP 122/74 (BP Location: Right Arm)   Pulse 82   Temp 97.8 F (36.6 C)   Ht 4' 11.5" (1.511 m)   Wt 171 lb (77.6 kg)   SpO2 97%   BMI 33.96 kg/m    CC: AMW Subjective:    Patient ID: [name] [name], female    DOB: 01/09/1946, 73 y.o.   MRN: [id number]  HPI: [name] [name] is a 73 y.o. female presenting on 07/08/2019 for Medicare Wellness   Did not see health advisor this year.  Here with daughter today.    Hearing Screening             Right ear:   40 40 40  0    Left ear:   40 0 0  0    Vision Screening Comments: Had eye exam in August 2020 with Dr Shirlyn Goltz.    Office Visit from 07/08/2019 in [facility] at [facility] Total Score  0    Noted trouble with L hearing.  Fall Risk  07/08/2019 06/27/2018 12/05/2017 06/26/2017 02/01/2016  Falls in the past year? 0 Yes Yes No Yes  Number falls in past yr: 0 2 or more 2 or more - 1  Injury with Fall? 0 Yes Yes - Yes  Risk Factor Category  - - High Fall Risk - -  Risk for fall due to : - - History of fall(s);Impaired mobility - -  Risk for fall due to: Comment - - She broke her back when she fell out of bed - -  Follow up - - Falls evaluation completed;Education provided;Falls prevention discussed - -      Recent UTI, culture grew enterobacter treated with cipro  7d course with resolution of symptoms. Seeing urologist later this month.   COPD - with O2 support at night time.   Preventative: Colonoscopy9/2017 for abnormal cologuard-decreased sphincter tone, diverticulosis, no f/u recommended ([facility]). Pap smear normal 11/2012 - strong fmhx cervical cancer. Had desired continued screening.Decided to age out. Denies pelvic pain or vaginal bleeding.  Mammogram normal 06/2017 [facility] - will call to reschedule.  Lung cancer screen -33 PY hx. Undergoing this.  DEXA Date: 11/2010 T -2.7 spine, -1.9 hip Osteoporosis. Rpt  DEXA 06/2017 - T score -2.2 spine, -2.4 hip. Compliant with vit D and fosamax weekly (started 2013). Found prolia to be unaffordable, desires to continue prolia. Poor calcium in diet so she takes extra calcium supplement.  Flu shot yearly Pneumovax - 11/2012. Prevnar 12/2013. Tdap 2016 Shingrix - discussed Advanced directives discussed - Advanced directive was reviewed and scanned (07/2018). [name] Theola Sequin then Jonette Pesa are HCPOA. Does not want life prolonged if deemed terminal condition. Does want tube feeding however. Wants living will followed above HCPOA. Discussed code status - she would want trail at CPR, pressors, intubation, aware of risks associated with these conditions including but not limited to rib fractures, difficulty weaning off ventilator.  Seat belt use discussed Sunscreen use discussed. No suspicious moles. Ex smoker quit 2014.  Alcohol - none  Dentist - has dentures - due for check up Eye exam yearly Bowel - no constipation Bladder - no incontinence  Lives with granddaughter and great grandchildren Occupation: retired, was CNA Edu: 8th grade Activity: no regular activity  Diet: some water, fruits/vegetables daily     Relevant past medical, surgical, family and social history reviewed and updated as indicated. Interim medical history since our last visit reviewed.  Allergies and medications reviewed and updated. Outpatient Medications Prior to Visit  Medication Sig Dispense Refill  . acetaminophen (TYLENOL) 500 MG tablet Take 1 tablet (500 mg total) by mouth every 6 (six) hours as needed for headache.    . albuterol (PROVENTIL HFA;VENTOLIN HFA) 108 (90 Base) MCG/ACT inhaler Inhale 2 puffs into the lungs every 6 (six) hours as needed for wheezing or shortness of breath. 1 Inhaler 2  . aspirin (ASPIRIN 81) 81 MG EC tablet Take 1 tablet (81 mg total) by mouth daily. Swallow whole.    [name] atorvastatin (LIPITOR) 40 MG tablet TAKE 1 TABLET (40 MG TOTAL) BY MOUTH  DAILY. 90 tablet 0  . benazepril (LOTENSIN) 5 MG tablet Take 1 tablet (5 mg total) by mouth daily. 30 tablet 3  . CALCIUM-VITAMIN D PO Take 1 tablet by mouth daily.    . Cholecalciferol (VITAMIN D3) 25 MCG (1000 UT) CAPS Take 1,000 Units by mouth daily.    . clopidogrel (PLAVIX) 75 MG tablet TAKE 1 TABLET (75 MG TOTAL) BY MOUTH DAILY. 90 tablet 1  . fluticasone-salmeterol (ADVAIR HFA) 115-21 MCG/ACT inhaler Inhale 2 puffs into the lungs 2 (two) times daily. Rinse mouth after use. 1 Inhaler 12  . furosemide (LASIX) 40 MG tablet Take 1 tablet (40 mg total) by mouth daily as needed for fluid or edema. 90 tablet 1  . gabapentin (NEURONTIN) 100 MG capsule Take 1 capsule (100 mg total) by mouth at bedtime. 90 capsule 1  . levothyroxine (SYNTHROID) 50 MCG tablet TAKE 1 TABLET  DAILY BEFORE BREAKFAST. 90 tablet 1  . Multiple Vitamins-Minerals (MULTIVITAMIN PO) Take 1 tablet by mouth daily.    . OXYGEN Inhale 2 L into the lungs at bedtime.    . pantoprazole (PROTONIX) 40 MG tablet Take 1 tablet (40 mg total) by mouth daily. For 2 weeks then as needed 90 tablet 2  . Potassium Chloride ER 20 MEQ TBCR TAKE 2 TABLETS BY MOUTH AS NEEDED (WHENEVER  YOU  TAKE  LASIX) 180 tablet 1  . sertraline (ZOLOFT) 100 MG tablet Take 1 tablet (100 mg total) by mouth daily. 90 tablet 3  . traMADol (ULTRAM) 50 MG tablet TAKE 1 TO 2 TABLETS BY MOUTH THREE TIMES DAILY AS NEEDED FOR MODERATE PAIN 30 tablet 0  . traZODone (DESYREL) 100 MG tablet Take 1 tablet (100 mg total) by mouth at bedtime. (Patient taking differently: Take 100 mg by mouth at bedtime as needed for sleep. ) 90 tablet 3  . nystatin cream (MYCOSTATIN) Apply 1 application topically 2 (two) times daily. 80 g 1  . ciprofloxacin (CIPRO) 250 MG tablet Take 1 tablet (250 mg total) by mouth 2 (two) times daily. 14 tablet 0   No facility-administered medications prior to visit.      Per HPI unless specifically indicated in ROS section below Review of Systems   Constitutional: Negative for activity change, appetite change, chills, fatigue, fever and unexpected weight change.  HENT: Negative for hearing loss.   Eyes: Negative for visual disturbance.  Respiratory: Negative for cough, chest tightness, shortness of breath and wheezing.   Cardiovascular: Negative for chest pain, palpitations and leg swelling.  Gastrointestinal: Negative for abdominal distention, abdominal pain, blood in stool, constipation, diarrhea, nausea and vomiting.  Genitourinary: Negative for difficulty urinating and hematuria.  Musculoskeletal: Negative for arthralgias, myalgias and neck pain.  Skin: Negative for rash.  Neurological: Negative for dizziness, seizures, syncope and headaches.  Hematological: Negative for adenopathy. Bruises/bleeds easily.  Psychiatric/Behavioral: Negative for  dysphoric mood. The patient is not nervous/anxious.    Objective:    BP 122/74 (BP Location: Right Arm)   Pulse 82   Temp 97.8 F (36.6 C)   Ht 4' 11.5" (1.511 m)   Wt 171 lb (77.6 kg)   SpO2 97%   BMI 33.96 kg/m   Wt Readings from Last 3 Encounters:  07/08/19 171 lb (77.6 kg)  06/11/19 161 lb 8 oz (73.3 kg)  05/06/19 168 lb 8 oz (76.4 kg)    Physical Exam Vitals signs and nursing note reviewed.  Constitutional:      General: She is not in acute distress.    Appearance: Normal appearance. She is well-developed. She is ill-appearing (frail).  HENT:     Head: Normocephalic and atraumatic.     Right Ear: Hearing, tympanic membrane, ear canal and external ear normal.     Left Ear: Tympanic membrane and external ear normal. Decreased hearing noted. There is impacted cerumen.     Ears:     Comments: Irrigation performed to L ear, pt tolerated well    Nose: Nose normal.     Mouth/Throat:     Mouth: Mucous membranes are moist.     Pharynx: Oropharynx is clear. Uvula midline. No posterior oropharyngeal erythema.  Eyes:     General: No scleral icterus.    Conjunctiva/sclera:  Conjunctivae normal.     Pupils: Pupils are equal, round, and reactive to light.  Neck:     Musculoskeletal: Normal range of motion and neck supple.     Vascular: Carotid bruit (bilateral) present.  Cardiovascular:     Rate and Rhythm: Normal rate and regular rhythm.     Pulses: Normal pulses.          Radial pulses are 2+ on the right side and 2+ on the left side.     Heart sounds: Normal heart sounds. No murmur.  Pulmonary:     Effort: Pulmonary effort is normal. No respiratory distress.     Breath sounds: Normal breath sounds. No wheezing, rhonchi or rales.  Abdominal:     General: Abdomen is flat. Bowel sounds are normal. There is no distension.     Palpations: Abdomen is soft. There is no mass.     Tenderness: There is no abdominal tenderness. There is no guarding or rebound.     Hernia: No hernia is present.  Musculoskeletal: Normal range of motion.     Right lower leg: No edema.     Left lower leg: No edema.  Lymphadenopathy:     Cervical: No cervical adenopathy.  Skin:    General: Skin is warm and dry.     Findings: No rash.  Neurological:     General: No focal deficit present.     Mental Status: She is alert and oriented to person, place, and time.     Comments: CN grossly intact, station and gait intact  Psychiatric:        Mood and Affect: Mood normal.        Behavior: Behavior normal.        Thought Content: Thought content normal.        Judgment: Judgment normal.       Results for orders placed or performed in visit on 07/01/19  VITAMIN D 25 Hydroxy (Vit-D Deficiency, Fractures)  Result Value Ref Range   VITD 37.50 30.00 - 100.00 ng/mL  CBC with Differential/Platelet  Result Value Ref Range   WBC 5.8 4.0 - 10.5 K/uL  RBC 4.14 3.87 - 5.11 Mil/uL   Hemoglobin 12.5 12.0 - 15.0 g/dL   HCT 38.4 36.0 - 46.0 %   MCV 92.7 78.0 - 100.0 fl   MCHC 32.5 30.0 - 36.0 g/dL   RDW 14.1 11.5 - 15.5 %   Platelets 169.0 150.0 - 400.0 K/uL   Neutrophils Relative % 59.9  43.0 - 77.0 %   Lymphocytes Relative 26.2 12.0 - 46.0 %   Monocytes Relative 9.4 3.0 - 12.0 %   Eosinophils Relative 3.2 0.0 - 5.0 %   Basophils Relative 1.3 0.0 - 3.0 %   Neutro Abs 3.5 1.4 - 7.7 K/uL   Lymphs Abs 1.5 0.7 - 4.0 K/uL   Monocytes Absolute 0.5 0.1 - 1.0 K/uL   Eosinophils Absolute 0.2 0.0 - 0.7 K/uL   Basophils Absolute 0.1 0.0 - 0.1 K/uL  Comprehensive metabolic panel  Result Value Ref Range   Sodium 139 135 - 145 mEq/L   Potassium 4.1 3.5 - 5.1 mEq/L   Chloride 104 96 - 112 mEq/L   CO2 27 19 - 32 mEq/L   Glucose, Bld 95 70 - 99 mg/dL   BUN 19 6 - 23 mg/dL   Creatinine, Ser 0.95 0.40 - 1.20 mg/dL   Total Bilirubin 0.4 0.2 - 1.2 mg/dL   Alkaline Phosphatase 117 39 - 117 U/L   AST 21 0 - 37 U/L   ALT 14 0 - 35 U/L   Total Protein 7.0 6.0 - 8.3 g/dL   Albumin 4.0 3.5 - 5.2 g/dL   Calcium 9.1 8.4 - 10.5 mg/dL   GFR 57.70 (L) >60.00 mL/min  Lipid panel  Result Value Ref Range   Cholesterol 237 (H) 0 - 200 mg/dL   Triglycerides 75.0 0.0 - 149.0 mg/dL   HDL 65.50 >39.00 mg/dL   VLDL 15.0 0.0 - 40.0 mg/dL   LDL Cholesterol 157 (H) 0 - 99 mg/dL   Total CHOL/HDL Ratio 4    NonHDL 171.90    Assessment & Plan:   Problem List Items Addressed This Visit    Vitamin D deficiency    Levels repleted. Continue vit D 1000 IU daily.       UTI (urinary tract infection)    Symptoms resolved with cipro.       Relevant Medications   nystatin cream (MYCOSTATIN)   Tremor    Chronic, stable.       Osteoporosis    Continues fosamax weekly since 2013. Prolia was unaffordable. Discussed calcium supplements vs calcium in diet. Continue vit D. Try to get most calcium from diet.       Neuropathy    Continue gabapentin at night which has been effective      Medicare annual wellness visit, subsequent - Primary    I have personally reviewed the Medicare Annual Wellness questionnaire and have noted 1. The patient's medical and social history 2. Their use of alcohol, tobacco  or illicit drugs 3. Their current medications and supplements 4. The patient's functional ability including ADL's, fall risks, home safety risks and hearing or visual impairment. Cognitive function has been assessed and addressed as indicated.  5. Diet and physical activity 6. Evidence for depression or mood disorders The patients weight, height, BMI have been recorded in the chart. I have made referrals, counseling and provided education to the patient based on review of the above and I have provided the pt with a written personalized care plan for preventive services. Provider list updated.. See scanned questionairre as  needed for further documentation. Reviewed preventative protocols and updated unless pt declined.       MDD (major depressive disorder), recurrent episode, moderate (HCC)    Stable period on sertraline - consider lowering dose.       Hypothyroidism (acquired)    Chronic, TFTs stable.       Hypertension    Chronic, stable. Continue current regimen.       Hyperlipidemia    Chronic, deteriorated. She thinks she's taking statin. Will verify at home. If she is, consider adding zetia. Goal LDL would be <70 if able to tolerate.  The ASCVD Risk score Denman George(Goff DC Jr., et al., 2013) failed to calculate for the following reasons:   The patient has a prior MI or stroke diagnosis       History of lacunar cerebrovascular accident (CVA)   Hemiparesis affecting right side as late effect of stroke (HCC)    Stable period. No falls this year.       Hearing loss due to cerumen impaction, left    S/p irrigation performed today.       Ex-smoker    Suggested she call pulm to inquire about f/u lung cancer screening CT.       Encounter for general adult medical examination with abnormal findings    Preventative protocols reviewed and updated unless pt declined. Discussed healthy diet and lifestyle.       COPD (chronic obstructive pulmonary disease) (HCC)    Appreciate pulm  care, continue advair.       CKD (chronic kidney disease) stage 3, GFR 30-59 ml/min    Chronic, stable. Continue to monitor.       Chronic respiratory failure with hypoxia (HCC)    Continues nocturnal oxygen.       Chronic diastolic heart failure (HCC)    Has not recently seen cards.       Carotid stenosis    Chronic, known. On medical management through IR with [facility].       Advanced care planning/counseling discussion    Advanced directive was reviewed and scanned (07/2018). [name] Theola SequinSue Patterson then Jonette Pesaonya Goatley are HCPOA. Does not want life prolonged if deemed terminal condition. Does want tube feeding however. Wants living will followed above HCPOA. Discussed code status - she would want trail at CPR, pressors, intubation, aware of risks associated with these conditions including but not limited to rib fractures, difficulty weaning off ventilator.        Other Visit Diagnoses    Need for influenza vaccination       Relevant Orders   Flu Vaccine QUAD High Dose(Fluad) (Completed)       Meds ordered this encounter  Medications  . nystatin cream (MYCOSTATIN)    Sig: Apply 1 application topically 2 (two) times daily.    Dispense:  80 g    Refill:  1   Orders Placed This Encounter  Procedures  . Flu Vaccine QUAD High Dose(Fluad)    Patient instructions: Flu shot today Call [facility] to reschedule mammogram.  Contact [facility] pulmonology to see if you're due for repeat CT for lung cancer screening.  Try to increase calcium in the diet, if you do well with this, then stop calcium supplement tablet (as the tablet may contribute to calcium buildup in arteries).  If interested, check with pharmacy about new 2 shot shingles series (shingrix).  Cholesterol levels were much higher today - ensure you're taking lipitor (atorvastatin) 40mg  daliy, let me know what you find at home.  If you are taking, we may need to add second cholesterol medicine.  Irrigation of left ear performed  today. You are doing well today. Return in 6 months for follow up visit.   Follow up plan: Return in about 6 months (around 01/06/2020) for follow up visit.  [name], MD

## 2019-07-08 NOTE — Assessment & Plan Note (Addendum)
Advanced directive was reviewed and scanned (07/2018). [name] [name] then [name] [name] are HCPOA. Does not want life prolonged if deemed terminal condition. Does want tube feeding however. Wants living will followed above HCPOA. Discussed code status - she would want trail at CPR, pressors, intubation, aware of risks associated with these conditions including but not limited to rib fractures, difficulty weaning off ventilator.

## 2019-07-08 NOTE — Assessment & Plan Note (Signed)
Appreciate pulm care, continue advair.

## 2019-07-08 NOTE — Assessment & Plan Note (Signed)
Chronic, stable. Continue to monitor.  

## 2019-07-08 NOTE — Assessment & Plan Note (Signed)
Symptoms resolved with cipro.

## 2019-07-08 NOTE — Assessment & Plan Note (Signed)
Chronic, known. On medical management through IR with [facility].

## 2019-07-08 NOTE — Assessment & Plan Note (Signed)
S/p irrigation performed today.

## 2019-07-08 NOTE — Assessment & Plan Note (Signed)
Suggested she call pulm to inquire about f/u lung cancer screening CT.

## 2019-07-10 ENCOUNTER — Other Ambulatory Visit (HOSPITAL_COMMUNITY): Payer: Self-pay | Admitting: Interventional Radiology

## 2019-07-10 DIAGNOSIS — I771 Stricture of artery: Secondary | ICD-10-CM

## 2019-07-18 ENCOUNTER — Ambulatory Visit: Payer: Self-pay | Admitting: Urology

## 2019-07-18 DIAGNOSIS — J449 Chronic obstructive pulmonary disease, unspecified: Secondary | ICD-10-CM | POA: Diagnosis not present

## 2019-07-18 DIAGNOSIS — R269 Unspecified abnormalities of gait and mobility: Secondary | ICD-10-CM | POA: Diagnosis not present

## 2019-07-24 ENCOUNTER — Ambulatory Visit (HOSPITAL_COMMUNITY)
Admission: RE | Admit: 2019-07-24 | Discharge: 2019-07-24 | Disposition: A | Discharge status: Home / Self Care | Payer: Medicare HMO | Source: Ambulatory Visit | Attending: Interventional Radiology | Admitting: Interventional Radiology

## 2019-07-24 ENCOUNTER — Ambulatory Visit (HOSPITAL_COMMUNITY): Admission: RE | Admit: 2019-07-24 | Payer: Medicare HMO | Source: Ambulatory Visit

## 2019-07-24 ENCOUNTER — Other Ambulatory Visit: Payer: Self-pay

## 2019-07-24 DIAGNOSIS — I6522 Occlusion and stenosis of left carotid artery: Secondary | ICD-10-CM | POA: Diagnosis not present

## 2019-07-24 DIAGNOSIS — I6601 Occlusion and stenosis of right middle cerebral artery: Secondary | ICD-10-CM | POA: Diagnosis not present

## 2019-07-24 DIAGNOSIS — I771 Stricture of artery: Secondary | ICD-10-CM | POA: Diagnosis not present

## 2019-07-24 DIAGNOSIS — G319 Degenerative disease of nervous system, unspecified: Secondary | ICD-10-CM | POA: Insufficient documentation

## 2019-07-24 DIAGNOSIS — Z8673 Personal history of transient ischemic attack (TIA), and cerebral infarction without residual deficits: Secondary | ICD-10-CM | POA: Insufficient documentation

## 2019-07-24 MED ORDER — GADOBUTROL 1 MMOL/ML IV SOLN
7.0000 mL | Freq: Once | INTRAVENOUS | Status: AC | PRN
  Administered 2019-07-24: 7 mL via INTRAVENOUS

## 2019-07-31 ENCOUNTER — Telehealth (HOSPITAL_COMMUNITY): Payer: Self-pay

## 2019-07-31 NOTE — Telephone Encounter (Signed)
Pt agreed to f/u in 6 months with mra head. AW 

## 2019-08-06 DIAGNOSIS — H26492 Other secondary cataract, left eye: Secondary | ICD-10-CM | POA: Diagnosis not present

## 2019-08-06 DIAGNOSIS — H40013 Open angle with borderline findings, low risk, bilateral: Secondary | ICD-10-CM | POA: Diagnosis not present

## 2019-08-06 DIAGNOSIS — H35033 Hypertensive retinopathy, bilateral: Secondary | ICD-10-CM | POA: Diagnosis not present

## 2019-08-06 DIAGNOSIS — Z961 Presence of intraocular lens: Secondary | ICD-10-CM | POA: Diagnosis not present

## 2019-08-07 ENCOUNTER — Telehealth: Payer: Self-pay

## 2019-08-07 ENCOUNTER — Encounter: Payer: Self-pay | Admitting: Urology

## 2019-08-07 ENCOUNTER — Ambulatory Visit: Payer: Medicare HMO | Admitting: Urology

## 2019-08-07 ENCOUNTER — Other Ambulatory Visit: Payer: Self-pay

## 2019-08-07 VITALS — BP 138/79 | HR 74 | Ht 59.5 in | Wt 171.0 lb

## 2019-08-07 DIAGNOSIS — N390 Urinary tract infection, site not specified: Secondary | ICD-10-CM

## 2019-08-07 DIAGNOSIS — N3000 Acute cystitis without hematuria: Secondary | ICD-10-CM | POA: Diagnosis not present

## 2019-08-07 LAB — URINALYSIS, COMPLETE
Bilirubin, UA: NEGATIVE
Glucose, UA: NEGATIVE
Ketones, UA: NEGATIVE
Leukocytes,UA: NEGATIVE
Nitrite, UA: NEGATIVE
Protein,UA: NEGATIVE
RBC, UA: NEGATIVE
Specific Gravity, UA: 1.025 (ref 1.005–1.030)
Urobilinogen, Ur: 0.2 mg/dL (ref 0.2–1.0)
pH, UA: 6.5 (ref 5.0–7.5)

## 2019-08-07 LAB — MICROSCOPIC EXAMINATION
Bacteria, UA: NONE SEEN
RBC, Urine: NONE SEEN /hpf (ref 0–2)

## 2019-08-07 MED ORDER — TRIMETHOPRIM 100 MG PO TABS: 100.0000 mg | ORAL_TABLET | Freq: Every day | ORAL | 6 refills | Status: DC

## 2019-08-07 MED ORDER — SULFAMETHOXAZOLE-TRIMETHOPRIM 800-160 MG PO TABS: 1.0000 | ORAL_TABLET | Freq: Two times a day (BID) | ORAL | 0 refills | Status: DC

## 2019-08-07 NOTE — Telephone Encounter (Signed)
Called pt's daughter Precious Bard) informed her of the information below. Daughter gave verbal understanding.

## 2019-08-07 NOTE — Patient Instructions (Addendum)
1. Take cranberry tablets twice daily to help prevent infections   Urinary Tract Infection, Adult  A urinary tract infection (UTI) is an infection of any part of the urinary tract. The urinary tract includes the kidneys, ureters, bladder, and urethra. These organs make, store, and get rid of urine in the body. Your health care provider may use other names to describe the infection. An upper UTI affects the ureters and kidneys (pyelonephritis). A lower UTI affects the bladder (cystitis) and urethra (urethritis). What are the causes? Most urinary tract infections are caused by bacteria in your genital area, around the entrance to your urinary tract (urethra). These bacteria grow and cause inflammation of your urinary tract. What increases the risk? You are more likely to develop this condition if:  You have a urinary catheter that stays in place (indwelling).  You are not able to control when you urinate or have a bowel movement (you have incontinence).  You are female and you: ? Use a spermicide or diaphragm for birth control. ? Have low estrogen levels. ? Are pregnant.  You have certain genes that increase your risk (genetics).  You are sexually active.  You take antibiotic medicines.  You have a condition that causes your flow of urine to slow down, such as: ? An enlarged prostate, if you are female. ? Blockage in your urethra (stricture). ? A kidney stone. ? A nerve condition that affects your bladder control (neurogenic bladder). ? Not getting enough to drink, or not urinating often.  You have certain medical conditions, such as: ? Diabetes. ? A weak disease-fighting system (immunesystem). ? Sickle cell disease. ? Gout. ? Spinal cord injury. What are the signs or symptoms? Symptoms of this condition include:  Needing to urinate right away (urgently).  Frequent urination or passing small amounts of urine frequently.  Pain or burning with urination.  Blood in the  urine.  Urine that smells bad or unusual.  Trouble urinating.  Cloudy urine.  Vaginal discharge, if you are female.  Pain in the abdomen or the lower back. You may also have:  Vomiting or a decreased appetite.  Confusion.  Irritability or tiredness.  A fever.  Diarrhea. The first symptom in older adults may be confusion. In some cases, they may not have any symptoms until the infection has worsened. How is this diagnosed? This condition is diagnosed based on your medical history and a physical exam. You may also have other tests, including:  Urine tests.  Blood tests.  Tests for sexually transmitted infections (STIs). If you have had more than one UTI, a cystoscopy or imaging studies may be done to determine the cause of the infections. How is this treated? Treatment for this condition includes:  Antibiotic medicine.  Over-the-counter medicines to treat discomfort.  Drinking enough water to stay hydrated. If you have frequent infections or have other conditions such as a kidney stone, you may need to see a health care provider who specializes in the urinary tract (urologist). In rare cases, urinary tract infections can cause sepsis. Sepsis is a life-threatening condition that occurs when the body responds to an infection. Sepsis is treated in the hospital with IV antibiotics, fluids, and other medicines. Follow these instructions at home:  Medicines  Take over-the-counter and prescription medicines only as told by your health care provider.  If you were prescribed an antibiotic medicine, take it as told by your health care provider. Do not stop using the antibiotic even if you start to feel better.  General instructions  Make sure you: ? Empty your bladder often and completely. Do not hold urine for long periods of time. ? Empty your bladder after sex. ? Wipe from front to back after a bowel movement if you are female. Use each tissue one time when you wipe.   Drink enough fluid to keep your urine pale yellow.  Keep all follow-up visits as told by your health care provider. This is important. Contact a health care provider if:  Your symptoms do not get better after 1-2 days.  Your symptoms go away and then return. Get help right away if you have:  Severe pain in your back or your lower abdomen.  A fever.  Nausea or vomiting. Summary  A urinary tract infection (UTI) is an infection of any part of the urinary tract, which includes the kidneys, ureters, bladder, and urethra.  Most urinary tract infections are caused by bacteria in your genital area, around the entrance to your urinary tract (urethra).  Treatment for this condition often includes antibiotic medicines.  If you were prescribed an antibiotic medicine, take it as told by your health care provider. Do not stop using the antibiotic even if you start to feel better.  Keep all follow-up visits as told by your health care provider. This is important. This information is not intended to replace advice given to you by your health care provider. Make sure you discuss any questions you have with your health care provider. Document Released: 06/22/2005 Document Revised: 08/30/2018 Document Reviewed: 03/22/2018 Elsevier Patient Education  2020 [employer].

## 2019-08-07 NOTE — Progress Notes (Signed)
08/07/19 9:22 AM   [name] 04/17/1946 [id number]  Referring provider: Ria Bush, MD [place],  [place] [address]  CC: recurrent UTIs  HPI: I saw Ms. Laskaris in consultation for recurrent UTIs from Dr. Danise Mina.  She is a 73 year old extremely comorbid female with history of aneurysm, COPD on oxygen overnight, peripheral vascular disease, and history of stroke who has had multiple recurrent infections over the last year.  These are well-documented in the micro tab of epic, and include Enterobacter, Klebsiella, and E. coli.  Her symptoms with UTI are foul-smelling urine and dysuria.  She reports some stress incontinence at baseline.  She denies any gross hematuria or flank pain.  She has an extensive 90-pack-year smoking history.  The most recent cross-sectional imaging is a CT lumbar spine in January 2019 that shows no hydronephrosis or staghorn stones.  There is no other recent cross-sectional imaging to review.  She was unable to urinate in clinic today and was straight cathed for 50 mL.   PMH: Past Medical History:  Diagnosis Date  . Aneurysm ([place])   . Anxiety   . Anxiety and depression   . Carotid stenosis    R 50% (12/2012)  . Concussion 08/03/2015  . COPD (chronic obstructive pulmonary disease) ([place]) 12/2012   spirometry: Pre: FVC 84%, FEV1 69%, ratio 0.64 consistent with moderate obstruction.  . Depression   . Fall 08/03/2015   d/c home health 08/2015  . Fracture of cervical vertebra, C5 (H[facility]) 08/06/2015  . History of chicken pox   . Hyperlipidemia   . Hypertension   . Lower back pain    h/o HNP s/p surgery  . Osteoarthritis    h/o ruptured disc s/p ESI  . Osteoporosis 11/2010   DEXA -2.7 spine, thoracic compression fracture  . Peripheral vascular disease ([place])   . Smoker    quit 10/2012  . Stroke [facility]) 2010   x3 with residual R hemiparesis, s/p R MCA balloon angioplasty (2010)    Surgical History: Past Surgical History:   Procedure Laterality Date  . APPENDECTOMY  1960  . CATARACT EXTRACTION     bilateral  . CESAREAN SECTION    . CHOLECYSTECTOMY  1970  . COLONOSCOPY  2004   diverticulosis, no polyps Sharlett Iles)  . COLONOSCOPY  05/2016   decreased sphincter tone, diverticulosis, no f/u recommended (Danis)  . DEXA  11/2010   T -2.7 spine, -1.9 hip  . HIP SURGERY Left 2006   fractured - screws placed  . IR ANGIO INTRA EXTRACRAN SEL COM CAROTID INNOMINATE BILAT MOD SED  08/01/2018  . IR ANGIO INTRA EXTRACRAN SEL COM CAROTID INNOMINATE BILAT MOD SED  12/10/2018  . IR ANGIO VERTEBRAL SEL SUBCLAVIAN INNOMINATE UNI R MOD SED  08/01/2018  . IR ANGIO VERTEBRAL SEL SUBCLAVIAN INNOMINATE UNI R MOD SED  12/10/2018  . IR ANGIO VERTEBRAL SEL VERTEBRAL UNI L MOD SED  12/10/2018  . IR RADIOLOGIST EVAL & MGMT  01/09/2018  . KYPHOPLASTY  10/02/2017   Procedure: LUMBAR FOUR KYPHOPLASTY;  Surgeon: Ashok Pall, MD;  Location: [facility];  Service: Neurosurgery;;  . RADIOLOGY WITH ANESTHESIA N/A 11/11/2015   Procedure: RADIOLOGY WITH ANESTHESIA;  Surgeon: Luanne Bras, MD;  Location: [facility];  Service: Radiology;  Laterality: N/A;  . RADIOLOGY WITH ANESTHESIA N/A 12/10/2018   Procedure: STENTING;  Surgeon: Luanne Bras, MD;  Location: [facility];  Service: Radiology;  Laterality: N/A;    Allergies:  Allergies  Allergen Reactions  . Doxycycline  Other (See Comments)    Sig GI upset    Family History: Family History  Problem Relation Age of Onset  . CAD Mother        MI  . Cancer Mother        cervical  . CAD Maternal Aunt        MI  . CAD Sister        MI  . Sudden death Father 43       died in his sleep, chain smoker  . Cirrhosis Father   . Hypertension Daughter   . Diabetes Maternal Aunt   . Cancer Maternal Grandmother        cervical    Social History:  reports that she quit smoking about 6 years ago. Her smoking use included cigarettes. She has a 92.00 pack-year smoking history. She has never used smokeless  tobacco. She reports that she does not drink alcohol or use drugs.  ROS: Please see flowsheet from today's date for complete review of systems.  Physical Exam: BP 138/79 (BP Location: Left Arm, Patient Position: Sitting, Cuff Size: Large)   Pulse 74   Ht 4' 11.5" (1.511 m)   Wt 171 lb (77.6 kg)   BMI 33.96 kg/m    Constitutional: Elderly, frail-appearing Cardiovascular: No clubbing, cyanosis, or edema. Respiratory: Normal respiratory effort, no increased work of breathing. GI: Abdomen is soft, nontender, nondistended, no abdominal masses GU: No CVA tenderness Lymph: No cervical or inguinal lymphadenopathy. Skin: No rashes, bruises or suspicious lesions. Psychiatric: Normal mood and affect.  Laboratory Data: See epic for prior urine cultures and sensitivities  Urinalysis is benign today with 0-5 WBCs, 0 RBCs, no bacteria, nitrite negative.  Assessment & Plan:   In summary, the patient is a very comorbid 73 year old female whose had recurrent UTIs over the last 1 to 2 years.  We discussed the evaluation and treatment of patients with recurrent UTIs at length.  We specifically discussed the differences between asymptomatic bacteriuria and true urinary tract infection.  We discussed the AUA definition of recurrent UTI of at least 2 culture proven symptomatic acute cystitis episodes in a 30-month period, or 3 within a 1 year period.  We discussed the importance of culture directed antibiotic treatment, and antibiotic stewardship.  First-line therapy includes nitrofurantoin(5 days), Bactrim(3 days), or fosfomycin(3 g single dose).  Possible etiologies of recurrent infection include periurethral tissue atrophy in postmenopausal woman, constipation, sexual activity, incomplete emptying, anatomic abnormalities, and even genetic predisposition.  Finally, we discussed the role of perineal hygiene, timed voiding, adequate hydration, topical vaginal estrogen, cranberry prophylaxis, and low-dose  antibiotic prophylaxis.  Trimethoprim 100mg  daily Cranberry ppx BID Renal to rule out hydro/stones, call with results RTC 3 months with PA for symptom check  A total of 45 minutes were spent face-to-face with the patient, greater than 50% was spent in patient education, counseling, and coordination of care regarding recurrent UTIs.   [place], MD  [facility] [address], [address] [place], [place] [phone]

## 2019-08-11 LAB — CULTURE, URINE COMPREHENSIVE

## 2019-08-14 ENCOUNTER — Other Ambulatory Visit: Payer: Self-pay

## 2019-08-14 ENCOUNTER — Ambulatory Visit
Admission: RE | Admit: 2019-08-14 | Discharge: 2019-08-14 | Disposition: A | Discharge status: Home / Self Care | Payer: Medicare HMO | Source: Ambulatory Visit | Attending: Urology | Admitting: Urology

## 2019-08-14 DIAGNOSIS — N281 Cyst of kidney, acquired: Secondary | ICD-10-CM | POA: Diagnosis not present

## 2019-08-14 DIAGNOSIS — N200 Calculus of kidney: Secondary | ICD-10-CM | POA: Diagnosis not present

## 2019-08-14 DIAGNOSIS — N390 Urinary tract infection, site not specified: Secondary | ICD-10-CM

## 2019-08-18 DIAGNOSIS — J449 Chronic obstructive pulmonary disease, unspecified: Secondary | ICD-10-CM | POA: Diagnosis not present

## 2019-08-18 DIAGNOSIS — R269 Unspecified abnormalities of gait and mobility: Secondary | ICD-10-CM | POA: Diagnosis not present

## 2019-09-11 ENCOUNTER — Other Ambulatory Visit: Payer: Self-pay | Admitting: Family Medicine

## 2019-09-17 DIAGNOSIS — R269 Unspecified abnormalities of gait and mobility: Secondary | ICD-10-CM | POA: Diagnosis not present

## 2019-09-17 DIAGNOSIS — J449 Chronic obstructive pulmonary disease, unspecified: Secondary | ICD-10-CM | POA: Diagnosis not present

## 2019-09-18 ENCOUNTER — Other Ambulatory Visit: Payer: Self-pay | Admitting: Family Medicine

## 2019-10-18 DIAGNOSIS — J449 Chronic obstructive pulmonary disease, unspecified: Secondary | ICD-10-CM | POA: Diagnosis not present

## 2019-10-18 DIAGNOSIS — R269 Unspecified abnormalities of gait and mobility: Secondary | ICD-10-CM | POA: Diagnosis not present

## 2019-10-30 ENCOUNTER — Other Ambulatory Visit: Payer: Self-pay | Admitting: Family Medicine

## 2019-10-30 NOTE — Telephone Encounter (Signed)
Last filled on 06/24/19 #90 with 1 refill  LOV 07/08/19 AWV Next appointment on 01/07/20 for follow up

## 2019-11-04 ENCOUNTER — Ambulatory Visit: Payer: Medicare HMO

## 2019-11-04 ENCOUNTER — Telehealth: Payer: Self-pay

## 2019-11-04 NOTE — Telephone Encounter (Signed)
Pt's daughter, [name], called the triage line. I called her back.  Says her mother is living with her other daughter, Jenny Reichmann [phone]. She went to see her mother yesterday and she was having issues with breathing. O2 was in the 70s and 80s per pt and sister and she is retaining fluid. Pt sauys about 20 lbs but she hasn't weighed.  [name] put her on her oxygen continuous 2L and her O2 came up to 92-93.   Says the pt will not listen to her but will listen to Dr Reece Agar.

## 2019-11-04 NOTE — Telephone Encounter (Addendum)
Lvm for [name] [name] (daughter- on dpr) to call back.  Need to relay Dr. Timoteo Expose message.   Spoke with pt's daughter, [name] [name] (on dpr), relaying Dr. Timoteo Expose message.  Verbalizes understanding.  Says pt was taking Lasix 2x wkly.  Then started every other day about 3 wks ago.  Per [name], pt's BP has been normal.  Says she will start pt on Lasix BID for next 3 days then call with an update.  Pt scheduled for OV tomorrow at 2:00.  FYI to Dr. Reece Agar.

## 2019-11-04 NOTE — Telephone Encounter (Signed)
That oxygen is very low - if staying 70-80s with oxygen, needs ER evaluation. I'm glad it did rise after placing oxygen. How is she taking lasix? How are blood pressures?  If not taking lasix daily, I recommend she start BID for 3 days and call us with effect. Concern for worsening heart failure causing these symptoms.  Recommend in office evaluation this week as long as no cough or other covid symptoms.

## 2019-11-05 ENCOUNTER — Encounter: Payer: Self-pay | Admitting: Family Medicine

## 2019-11-05 ENCOUNTER — Other Ambulatory Visit: Payer: Self-pay

## 2019-11-05 ENCOUNTER — Ambulatory Visit (INDEPENDENT_AMBULATORY_CARE_PROVIDER_SITE_OTHER): Payer: Medicare HMO | Admitting: Family Medicine

## 2019-11-05 ENCOUNTER — Encounter: Payer: Self-pay | Admitting: Physician Assistant

## 2019-11-05 ENCOUNTER — Ambulatory Visit: Payer: Medicare HMO | Admitting: Physician Assistant

## 2019-11-05 VITALS — BP 132/74 | HR 80 | Ht 59.0 in | Wt 181.0 lb

## 2019-11-05 VITALS — BP 116/64 | HR 98 | Temp 97.8°F | Ht 59.5 in | Wt 184.4 lb

## 2019-11-05 DIAGNOSIS — I69351 Hemiplegia and hemiparesis following cerebral infarction affecting right dominant side: Secondary | ICD-10-CM

## 2019-11-05 DIAGNOSIS — N390 Urinary tract infection, site not specified: Secondary | ICD-10-CM | POA: Diagnosis not present

## 2019-11-05 DIAGNOSIS — R609 Edema, unspecified: Secondary | ICD-10-CM

## 2019-11-05 DIAGNOSIS — I5032 Chronic diastolic (congestive) heart failure: Secondary | ICD-10-CM

## 2019-11-05 DIAGNOSIS — J9611 Chronic respiratory failure with hypoxia: Secondary | ICD-10-CM | POA: Diagnosis not present

## 2019-11-05 DIAGNOSIS — R0602 Shortness of breath: Secondary | ICD-10-CM | POA: Diagnosis not present

## 2019-11-05 DIAGNOSIS — N3946 Mixed incontinence: Secondary | ICD-10-CM | POA: Diagnosis not present

## 2019-11-05 DIAGNOSIS — R600 Localized edema: Secondary | ICD-10-CM

## 2019-11-05 DIAGNOSIS — J432 Centrilobular emphysema: Secondary | ICD-10-CM

## 2019-11-05 DIAGNOSIS — N3000 Acute cystitis without hematuria: Secondary | ICD-10-CM | POA: Diagnosis not present

## 2019-11-05 LAB — URINALYSIS, COMPLETE
Bilirubin, UA: NEGATIVE
Glucose, UA: NEGATIVE
Ketones, UA: NEGATIVE
Leukocytes,UA: NEGATIVE
Nitrite, UA: NEGATIVE
Protein,UA: NEGATIVE
Specific Gravity, UA: 1.02 (ref 1.005–1.030)
Urobilinogen, Ur: 0.2 mg/dL (ref 0.2–1.0)
pH, UA: 5 (ref 5.0–7.5)

## 2019-11-05 LAB — MICROSCOPIC EXAMINATION
Bacteria, UA: NONE SEEN
WBC, UA: NONE SEEN /hpf (ref 0–5)

## 2019-11-05 LAB — BLADDER SCAN AMB NON-IMAGING: SCA Result: 23

## 2019-11-05 MED ORDER — TRIMETHOPRIM 100 MG PO TABS: 100.0000 mg | ORAL_TABLET | Freq: Every day | ORAL | 1 refills | Status: DC

## 2019-11-05 NOTE — Progress Notes (Signed)
11/05/2019 10:12 AM   [name] [name] Burke Keels [birth year] [id number]  CC: Symptom recheck  HPI: [name] [name] is a 74 y.o. female who presents today for 52-month symptom recheck. She was first seen by Dr. Richardo Hanks on 08/07/2019 in consultation for recurrent UTI.  Typical symptoms of these include malodorous urine and dysuria.  She was started on trimethoprim 100 mg daily prophylaxis and cranberry supplementation twice daily at that visit.  Renal ultrasound on 08/14/2019 revealed small bilateral renal cysts and bilateral nonobstructing renal stones without hydronephrosis.  Today, she reports that she is continuing to take trimethoprim once daily and cranberry supplements twice daily.  She has not had any breakthrough infections since starting daily prophylaxis.  She is tolerating the medication well and has no acute concerns.  Additionally, she does note urinary incontinence.  She wears absorbent pads to manage these daily.  Notably, she reports leakage with sneezing and coughing, leakage with a sense of urinary urgency, and difficulty ambulating to the restroom in time.  She also reports history of CHF and has recently been instructed by her PCP to increase her daily Lasix dose due to fluid retention.  This is increased her urinary frequency.  In-office UA today positive for trace-intact blood; urine microscopy pan-negative. PVR 17mL.  PMH: Past Medical History:  Diagnosis Date  . Aneurysm (HCC)   . Anxiety   . Anxiety and depression   . Carotid stenosis    R 50% (12/2012)  . Concussion 08/03/2015  . COPD (chronic obstructive pulmonary disease) (H[facility]) 12/2012   spirometry: Pre: FVC 84%, FEV1 69%, ratio 0.64 consistent with moderate obstruction.  . Depression   . Fall 08/03/2015   d/c home health 08/2015  . Fracture of cervical vertebra, C5 (HCC) 08/06/2015  . History of chicken pox   . Hyperlipidemia   . Hypertension   . Lower back pain    h/o HNP s/p surgery  . Osteoarthritis    h/o  ruptured disc s/p ESI  . Osteoporosis 11/2010   DEXA -2.7 spine, thoracic compression fracture  . Peripheral vascular disease ([facility])   . Smoker    quit 10/2012  . Stroke [facility]) 2010   x3 with residual R hemiparesis, s/p R MCA balloon angioplasty (2010)    Surgical History: Past Surgical History:  Procedure Laterality Date  . APPENDECTOMY  1960  . CATARACT EXTRACTION     bilateral  . CESAREAN SECTION    . CHOLECYSTECTOMY  1970  . COLONOSCOPY  2004   diverticulosis, no polyps Jarold Motto)  . COLONOSCOPY  05/2016   decreased sphincter tone, diverticulosis, no f/u recommended ([facility]is)  . DEXA  11/2010   T -2.7 spine, -1.9 hip  . HIP SURGERY Left 2006   fractured - screws placed  . IR ANGIO INTRA EXTRACRAN SEL COM CAROTID INNOMINATE BILAT MOD SED  08/01/2018  . IR ANGIO INTRA EXTRACRAN SEL COM CAROTID INNOMINATE BILAT MOD SED  12/10/2018  . IR ANGIO VERTEBRAL SEL SUBCLAVIAN INNOMINATE UNI R MOD SED  08/01/2018  . IR ANGIO VERTEBRAL SEL SUBCLAVIAN INNOMINATE UNI R MOD SED  12/10/2018  . IR ANGIO VERTEBRAL SEL VERTEBRAL UNI L MOD SED  12/10/2018  . IR RADIOLOGIST EVAL & MGMT  01/09/2018  . KYPHOPLASTY  10/02/2017   Procedure: LUMBAR FOUR KYPHOPLASTY;  Surgeon: Coletta Memos, MD;  Location: [facility];  Service: Neurosurgery;;  . RADIOLOGY WITH ANESTHESIA N/A 11/11/2015   Procedure: RADIOLOGY WITH ANESTHESIA;  Surgeon: Julieanne Cotton, MD;  Location: [facility] OR;  Service: Radiology;  Laterality: N/A;  . RADIOLOGY WITH ANESTHESIA N/A 12/10/2018   Procedure: STENTING;  Surgeon: Julieanne Cotton, MD;  Location: [facility] OR;  Service: Radiology;  Laterality: N/A;    Home Medications:  Allergies as of 11/05/2019      Reactions   Doxycycline Other (See Comments)   Sig GI upset      Medication List       Accurate as of November 05, 2019 10:12 AM. If you have any questions, ask your nurse or doctor.        acetaminophen 500 MG tablet Commonly known as: TYLENOL Take 1 tablet (500 mg total) by mouth every 6  (six) hours as needed for headache.   albuterol 108 (90 Base) MCG/ACT inhaler Commonly known as: VENTOLIN HFA Inhale 2 puffs into the lungs every 6 (six) hours as needed for wheezing or shortness of breath.   aspirin 81 MG EC tablet Commonly known as: Aspirin 81 Take 1 tablet (81 mg total) by mouth daily. Swallow whole.   atorvastatin 40 MG tablet Commonly known as: LIPITOR TAKE 1 TABLET EVERY DAY   benazepril 5 MG tablet Commonly known as: LOTENSIN Take 1 tablet (5 mg total) by mouth daily.   CALCIUM-VITAMIN D PO Take 1 tablet by mouth daily.   clopidogrel 75 MG tablet Commonly known as: PLAVIX TAKE 1 TABLET (75 MG TOTAL) BY MOUTH DAILY.   fluticasone-salmeterol 115-21 MCG/ACT inhaler Commonly known as: Advair HFA Inhale 2 puffs into the lungs 2 (two) times daily. Rinse mouth after use.   furosemide 40 MG tablet Commonly known as: LASIX TAKE 1 TABLET (40 MG TOTAL) BY MOUTH DAILY AS NEEDED FOR FLUID OR EDEMA.   gabapentin 100 MG capsule Commonly known as: NEURONTIN Take 1 capsule (100 mg total) by mouth at bedtime.   levothyroxine 50 MCG tablet Commonly known as: SYNTHROID TAKE 1 TABLET  DAILY BEFORE BREAKFAST.   MULTIVITAMIN PO Take 1 tablet by mouth daily.   nystatin cream Commonly known as: MYCOSTATIN Apply 1 application topically 2 (two) times daily.   OXYGEN Inhale 2 L into the lungs at bedtime.   pantoprazole 40 MG tablet Commonly known as: PROTONIX Take 1 tablet (40 mg total) by mouth daily. For 2 weeks then as needed   Potassium Chloride ER 20 MEQ Tbcr TAKE 2 TABLETS BY MOUTH AS NEEDED (WHENEVER  YOU  TAKE  LASIX)   sertraline 100 MG tablet Commonly known as: ZOLOFT TAKE 1 TABLET EVERY DAY   traMADol 50 MG tablet Commonly known as: ULTRAM TAKE 1 TO 2 TABLETS BY MOUTH THREE TIMES DAILY AS NEEDED FOR MODERATE PAIN   traZODone 100 MG tablet Commonly known as: DESYREL TAKE 1 TABLET AT BEDTIME   trimethoprim 100 MG tablet Commonly known as:  TRIMPEX Take 1 tablet (100 mg total) by mouth daily. Take daily to prevent UTI   Vitamin D3 25 MCG (1000 UT) Caps Take 1,000 Units by mouth daily.       Allergies:  Allergies  Allergen Reactions  . Doxycycline Other (See Comments)    Sig GI upset    Family History: Family History  Problem Relation Age of Onset  . CAD Mother        MI  . Cancer Mother        cervical  . CAD Maternal Aunt        MI  . CAD Sister        MI  . Sudden death Father 34  died in his sleep, chain smoker  . Cirrhosis Father   . Hypertension Daughter   . Diabetes Maternal Aunt   . Cancer Maternal Grandmother        cervical    Social History:   reports that she quit smoking about 6 years ago. Her smoking use included cigarettes. She has a 92.00 pack-year smoking history. She has never used smokeless tobacco. She reports that she does not drink alcohol or use drugs.  ROS: UROLOGY Frequent Urination?: No Hard to postpone urination?: Yes Burning/pain with urination?: No Get up at night to urinate?: Yes Leakage of urine?: Yes Urine stream starts and stops?: No Trouble starting stream?: No Do you have to strain to urinate?: No Blood in urine?: No Urinary tract infection?: No Sexually transmitted disease?: No Injury to kidneys or bladder?: No Painful intercourse?: No Weak stream?: Yes Currently pregnant?: No Vaginal bleeding?: No Last menstrual period?: n  Gastrointestinal Nausea?: No Vomiting?: No Indigestion/heartburn?: No Diarrhea?: No Constipation?: No  Constitutional Fever: No Night sweats?: No Weight loss?: No  Skin Skin rash/lesions?: No Itching?: No  Eyes Blurred vision?: No Double vision?: No  Ears/Nose/Throat Sore throat?: No Sinus problems?: Yes  Hematologic/Lymphatic Swollen glands?: No Easy bruising?: Yes  Cardiovascular Leg swelling?: Yes Chest pain?: No  Respiratory Cough?: No Shortness of breath?: No  Endocrine Excessive thirst?:  No  Musculoskeletal Back pain?: No Joint pain?: No  Neurological Headaches?: No Dizziness?: No  Psychologic Depression?: No Anxiety?: No  Physical Exam: BP 132/74   Pulse 80   Ht 4\' 11"  (1.499 m)   Wt 181 lb (82.1 kg)   BMI 36.56 kg/m   Constitutional:  Alert and oriented, no acute distress, nontoxic appearing HEENT: [place], AT Cardiovascular: No clubbing, cyanosis, or edema Respiratory: Normal respiratory effort, no increased work of breathing Skin: No rashes, bruises or suspicious lesions Neurologic: Grossly intact, no focal deficits, moving all 4 extremities Psychiatric: Normal mood and affect  Laboratory Data: Results for orders placed or performed in visit on 11/05/19  Microscopic Examination   URINE  Result Value Ref Range   WBC, UA None seen 0 - 5 /hpf   RBC 0-2 0 - 2 /hpf   Epithelial Cells (non renal) 0-10 0 - 10 /hpf   Casts Present (A) None seen /lpf   Cast Type Hyaline casts N/A   Bacteria, UA None seen None seen/Few  Urinalysis, Complete  Result Value Ref Range   Specific Gravity, UA 1.020 1.005 - 1.030   pH, UA 5.0 5.0 - 7.5   Color, UA Yellow Yellow   Appearance Ur Clear Clear   Leukocytes,UA Negative Negative   Protein,UA Negative Negative/Trace   Glucose, UA Negative Negative   Ketones, UA Negative Negative   RBC, UA Trace (A) Negative   Bilirubin, UA Negative Negative   Urobilinogen, Ur 0.2 0.2 - 1.0 mg/dL   Nitrite, UA Negative Negative   Microscopic Examination See below:   Bladder Scan (Post Void Residual) in office  Result Value Ref Range   SCA Result 23    Assessment & Plan:   1. Recurrent UTI 74 year old female with a history of recurrent UTI on daily trimethoprim and cranberry prophylaxis with no breakthrough infections x3 months.  She is tolerating these medications well and has no acute concerns.  4-month refill of trimethoprim sent today.  I would like her to follow-up with Dr. 8-month at that time to discuss continuation of  suppressive antibiotics. - Urinalysis, Complete - trimethoprim (TRIMPEX) 100 MG tablet;  Take 1 tablet (100 mg total) by mouth daily. Take daily to prevent UTI  Dispense: 90 tablet; Refill: 1  2. Mixed incontinence Patient reports mixed urinary incontinence, featuring stress, urge, and functional features.  Patient is not particularly bothered by these symptoms and is not interested in pursuing therapy to manage them at this time.  I am in agreement with this plan.  If she becomes increasingly bothered by her symptoms in the future, may consider a trial of Myrbetriq 25 mg.  I explained to her today that due to the complexity of her incontinence and the need for diuresis, resolution of her urinary incontinence may not be realistic.  Additionally, I do not recommend anticholinergics in this patient given her age and medical comorbidities. - Bladder Scan (Post Void Residual) in office   Return in about 6 months (around 05/04/2020) for rUTI follow-up with Dr. Diamantina Providence.  Debroah Loop, PA-C  [facility] [address] [place], [place] [address] [phone]

## 2019-11-05 NOTE — Patient Instructions (Addendum)
Continue low salt/sodium diet Labs today.  EKG today  Ambulatory pulse ox today.  Continue lasix 40mg  twice daily for 3 days total then return to daily.  Continue potassium daily while on lasix daily.

## 2019-11-05 NOTE — Progress Notes (Signed)
This visit was conducted in person.  BP 116/64 (BP Location: Right Arm, Patient Position: Sitting, Cuff Size: Large)   Pulse 98   Temp 97.8 F (36.6 C) (Temporal)   Ht 4' 11.5" (1.511 m)   Wt 184 lb 7 oz (83.7 kg)   SpO2 93%   BMI 36.63 kg/m    CC: shortness of breath and pedal edema Subjective:    Patient ID: [name] [name], female    DOB: May 10, 1946, 74 y.o.   MRN: [id number]  HPI: [name] [name] is a 74 y.o. female presenting on 11/05/2019 for Foot Swelling (C/o bilateral feet swelling.  Started about 3 wks ago.  Pt accompanied by daughter, [name]- temp 97.8. )   See yesterday's phone note for details. Patient now lives with [name].  Other daughter [name] called concerned with worsening dyspnea and hypoxia over a few weeks. She also notices increasing bilateral foot swelling and weight gain. Pt refused ER evaluation. Normally only on night time oxygen, she had started wearing 2L regularly with improvement in O2 level to 93%.   Avoids salt in diet.   She was only using lasix 40mg  2 times a week, this has been increased to BID as of yesterday.   No cough. Ongoing PNDrainage. No chest pain, tightness. Chronic and recently worsening dyspnea.   Saw urology earlier today for recurrent UTI (stable period on daily trimethoprim + cranberry tablets) and mixed incontinence - PVR = 53mL.      Relevant past medical, surgical, family and social history reviewed and updated as indicated. Interim medical history since our last visit reviewed. Allergies and medications reviewed and updated. Outpatient Medications Prior to Visit  Medication Sig Dispense Refill  . acetaminophen (TYLENOL) 500 MG tablet Take 1 tablet (500 mg total) by mouth every 6 (six) hours as needed for headache.    . albuterol (PROVENTIL HFA;VENTOLIN HFA) 108 (90 Base) MCG/ACT inhaler Inhale 2 puffs into the lungs every 6 (six) hours as needed for wheezing or shortness of breath. 1 Inhaler 2  . aspirin (ASPIRIN 81) 81  MG EC tablet Take 1 tablet (81 mg total) by mouth daily. Swallow whole.    37m atorvastatin (LIPITOR) 40 MG tablet TAKE 1 TABLET EVERY DAY 90 tablet 1  . benazepril (LOTENSIN) 5 MG tablet Take 1 tablet (5 mg total) by mouth daily. 30 tablet 3  . CALCIUM-VITAMIN D PO Take 1 tablet by mouth daily.    . Cholecalciferol (VITAMIN D3) 25 MCG (1000 UT) CAPS Take 1,000 Units by mouth daily.    . clopidogrel (PLAVIX) 75 MG tablet TAKE 1 TABLET (75 MG TOTAL) BY MOUTH DAILY. 90 tablet 1  . fluticasone-salmeterol (ADVAIR HFA) 115-21 MCG/ACT inhaler Inhale 2 puffs into the lungs 2 (two) times daily. Rinse mouth after use. 1 Inhaler 12  . furosemide (LASIX) 40 MG tablet TAKE 1 TABLET (40 MG TOTAL) BY MOUTH DAILY AS NEEDED FOR FLUID OR EDEMA. 90 tablet 1  . gabapentin (NEURONTIN) 100 MG capsule Take 1 capsule (100 mg total) by mouth at bedtime. 90 capsule 1  . levothyroxine (SYNTHROID) 50 MCG tablet TAKE 1 TABLET  DAILY BEFORE BREAKFAST. 90 tablet 1  . Multiple Vitamins-Minerals (MULTIVITAMIN PO) Take 1 tablet by mouth daily.    . OXYGEN Inhale 2 L into the lungs at bedtime.    . pantoprazole (PROTONIX) 40 MG tablet Take 1 tablet (40 mg total) by mouth daily. For 2 weeks then as needed 90 tablet 2  . sertraline (ZOLOFT) 100  MG tablet TAKE 1 TABLET EVERY DAY 90 tablet 1  . traMADol (ULTRAM) 50 MG tablet TAKE 1 TO 2 TABLETS BY MOUTH THREE TIMES DAILY AS NEEDED FOR MODERATE PAIN 30 tablet 0  . traZODone (DESYREL) 100 MG tablet TAKE 1 TABLET AT BEDTIME 90 tablet 1  . trimethoprim (TRIMPEX) 100 MG tablet Take 1 tablet (100 mg total) by mouth daily. Take daily to prevent UTI 90 tablet 1  . Potassium Chloride ER 20 MEQ TBCR TAKE 2 TABLETS BY MOUTH AS NEEDED (WHENEVER  YOU  TAKE  LASIX) 180 tablet 1  . nystatin cream (MYCOSTATIN) Apply 1 application topically 2 (two) times daily. 80 g 1   No facility-administered medications prior to visit.     Per HPI unless specifically indicated in ROS section below Review of  Systems Objective:    BP 116/64 (BP Location: Right Arm, Patient Position: Sitting, Cuff Size: Large)   Pulse 98   Temp 97.8 F (36.6 C) (Temporal)   Ht 4' 11.5" (1.511 m)   Wt 184 lb 7 oz (83.7 kg)   SpO2 93%   BMI 36.63 kg/m   Wt Readings from Last 3 Encounters:  11/05/19 184 lb 7 oz (83.7 kg)  11/05/19 181 lb (82.1 kg)  08/07/19 171 lb (77.6 kg)    Physical Exam Vitals and nursing note reviewed.  Constitutional:      Appearance: Normal appearance. She is obese. She is not ill-appearing.  Neck:     Vascular: No hepatojugular reflux or JVD.  Cardiovascular:     Rate and Rhythm: Normal rate and regular rhythm.     Pulses: Normal pulses.     Heart sounds: Normal heart sounds. No murmur.  Pulmonary:     Effort: Pulmonary effort is normal. No respiratory distress.     Breath sounds: Normal breath sounds. No wheezing, rhonchi or rales.     Comments: Faint RLL crackles Musculoskeletal:     Right lower leg: No edema.     Left lower leg: No edema.  Skin:    General: Skin is warm and dry.     Findings: No rash.  Neurological:     Mental Status: She is alert.  Psychiatric:        Mood and Affect: Mood normal.       Results for orders placed or performed in visit on 11/05/19  Renal function panel  Result Value Ref Range   Sodium 138 135 - 145 mEq/L   Potassium 4.6 3.5 - 5.1 mEq/L   Chloride 101 96 - 112 mEq/L   CO2 28 19 - 32 mEq/L   Albumin 4.1 3.5 - 5.2 g/dL   BUN 30 (H) 6 - 23 mg/dL   Creatinine, Ser 1.65 (H) 0.40 - 1.20 mg/dL   Glucose, Bld 108 (H) 70 - 99 mg/dL   Phosphorus 3.9 2.3 - 4.6 mg/dL   GFR 30.48 (L) >60.00 mL/min   Calcium 9.2 8.4 - 10.5 mg/dL  CBC with Differential/Platelet  Result Value Ref Range   WBC 11.0 (H) 4.0 - 10.5 K/uL   RBC 4.21 3.87 - 5.11 Mil/uL   Hemoglobin 12.7 12.0 - 15.0 g/dL   HCT 38.7 36.0 - 46.0 %   MCV 91.8 78.0 - 100.0 fl   MCHC 32.8 30.0 - 36.0 g/dL   RDW 13.7 11.5 - 15.5 %   Platelets 218.0 150.0 - 400.0 K/uL    Neutrophils Relative % 71.1 43.0 - 77.0 %   Lymphocytes Relative 16.6 12.0 - 46.0 %  Monocytes Relative 9.6 3.0 - 12.0 %   Eosinophils Relative 1.7 0.0 - 5.0 %   Basophils Relative 1.0 0.0 - 3.0 %   Neutro Abs 7.8 (H) 1.4 - 7.7 K/uL   Lymphs Abs 1.8 0.7 - 4.0 K/uL   Monocytes Absolute 1.1 (H) 0.1 - 1.0 K/uL   Eosinophils Absolute 0.2 0.0 - 0.7 K/uL   Basophils Absolute 0.1 0.0 - 0.1 K/uL  Brain natriuretic peptide  Result Value Ref Range   Pro B Natriuretic peptide (BNP) 32.0 0.0 - 100.0 pg/mL  Magnesium  Result Value Ref Range   Magnesium 1.8 1.5 - 2.5 mg/dL   EKG - NSR 93A, normal axis, intervals, no acute ST/T changes Assessment & Plan:  This visit occurred during the SARS-CoV-2 public health emergency.  Safety protocols were in place, including screening questions prior to the visit, additional usage of staff PPE, and extensive cleaning of exam room while observing appropriate contact time as indicated for disinfecting solutions.   Problem List Items Addressed This Visit    Shortness of breath - Primary    With noted weight gain and hypoxia, anticipate acute exacerbation of CHF - she has already started treatment at home with increasing lasix frequency with noted benefit. Update labwork today, continue lasix BID x 3 days then return to daily dosing.  ADDENDUM ==> with noted worsening kidney function, I recommended she decrease lasix to MWF dosing with planned rpt labs next week.       Relevant Orders   Renal function panel (Completed)   CBC with Differential/Platelet (Completed)   Brain natriuretic peptide (Completed)   Magnesium (Completed)   EKG 12-Lead (Completed)   Peripheral edema    No significant pedal edema - family says this has improved with higher lasix dose over the last few days.       Relevant Orders   Renal function panel (Completed)   CBC with Differential/Platelet (Completed)   Brain natriuretic peptide (Completed)   Magnesium (Completed)   Hemiparesis  affecting right side as late effect of stroke (HCC)    Stable period without falls. Continues statin and aspirin and plavix       COPD (chronic obstructive pulmonary disease) (HCC)    Continue advair and PRN albuterol - stable period.       Chronic respiratory failure with hypoxia (HCC)    Increased O2 requirement noted - now doing better - off oxygen.       Chronic diastolic heart failure (HCC)    No recent cardiology evaluation. If not improving with increased lasix, will need re evaluation.  Overall euvolemic today (she had increased lasix for the last several days)      Relevant Orders   Brain natriuretic peptide (Completed)       No orders of the defined types were placed in this encounter.  Orders Placed This Encounter  Procedures  . Renal function panel  . CBC with Differential/Platelet  . Brain natriuretic peptide  . Magnesium  . EKG 12-Lead    Patient Instructions  Continue low salt/sodium diet Labs today.  EKG today  Ambulatory pulse ox today.  Continue lasix 40mg  twice daily for 3 days total then return to daily.  Continue potassium daily while on lasix daily.    Follow up plan: Return if symptoms worsen or fail to improve.  , MD

## 2019-11-06 ENCOUNTER — Other Ambulatory Visit: Payer: Self-pay | Admitting: Family Medicine

## 2019-11-06 LAB — RENAL FUNCTION PANEL
Albumin: 4.1 g/dL (ref 3.5–5.2)
BUN: 30 mg/dL — ABNORMAL HIGH (ref 6–23)
CO2: 28 mEq/L (ref 19–32)
Calcium: 9.2 mg/dL (ref 8.4–10.5)
Chloride: 101 mEq/L (ref 96–112)
Creatinine, Ser: 1.65 mg/dL — ABNORMAL HIGH (ref 0.40–1.20)
GFR: 30.48 mL/min — ABNORMAL LOW (ref >60.00)
Glucose, Bld: 108 mg/dL — ABNORMAL HIGH (ref 70–99)
Phosphorus: 3.9 mg/dL (ref 2.3–4.6)
Potassium: 4.6 mEq/L (ref 3.5–5.1)
Sodium: 138 mEq/L (ref 135–145)

## 2019-11-06 LAB — MAGNESIUM: Magnesium: 1.8 mg/dL (ref 1.5–2.5)

## 2019-11-06 LAB — CBC WITH DIFFERENTIAL/PLATELET
Basophils Absolute: 0.1 10*3/uL (ref 0.0–0.1)
Basophils Relative: 1 % (ref 0.0–3.0)
Eosinophils Absolute: 0.2 10*3/uL (ref 0.0–0.7)
Eosinophils Relative: 1.7 % (ref 0.0–5.0)
HCT: 38.7 % (ref 36.0–46.0)
Hemoglobin: 12.7 g/dL (ref 12.0–15.0)
Lymphocytes Relative: 16.6 % (ref 12.0–46.0)
Lymphs Abs: 1.8 10*3/uL (ref 0.7–4.0)
MCHC: 32.8 g/dL (ref 30.0–36.0)
MCV: 91.8 fl (ref 78.0–100.0)
Monocytes Absolute: 1.1 10*3/uL — ABNORMAL HIGH (ref 0.1–1.0)
Monocytes Relative: 9.6 % (ref 3.0–12.0)
Neutro Abs: 7.8 10*3/uL — ABNORMAL HIGH (ref 1.4–7.7)
Neutrophils Relative %: 71.1 % (ref 43.0–77.0)
Platelets: 218 10*3/uL (ref 150.0–400.0)
RBC: 4.21 Mil/uL (ref 3.87–5.11)
RDW: 13.7 % (ref 11.5–15.5)
WBC: 11 10*3/uL — ABNORMAL HIGH (ref 4.0–10.5)

## 2019-11-06 LAB — BRAIN NATRIURETIC PEPTIDE: Pro B Natriuretic peptide (BNP): 32 pg/mL (ref 0.0–100.0)

## 2019-11-07 DIAGNOSIS — R269 Unspecified abnormalities of gait and mobility: Secondary | ICD-10-CM | POA: Diagnosis not present

## 2019-11-07 DIAGNOSIS — J449 Chronic obstructive pulmonary disease, unspecified: Secondary | ICD-10-CM | POA: Diagnosis not present

## 2019-11-09 ENCOUNTER — Encounter: Payer: Self-pay | Admitting: Family Medicine

## 2019-11-09 ENCOUNTER — Other Ambulatory Visit: Payer: Self-pay | Admitting: Family Medicine

## 2019-11-09 DIAGNOSIS — N179 Acute kidney failure, unspecified: Secondary | ICD-10-CM

## 2019-11-09 DIAGNOSIS — R600 Localized edema: Secondary | ICD-10-CM | POA: Insufficient documentation

## 2019-11-09 NOTE — Assessment & Plan Note (Signed)
No significant pedal edema - family says this has improved with higher lasix dose over the last few days.

## 2019-11-09 NOTE — Assessment & Plan Note (Addendum)
No recent cardiology evaluation. If not improving with increased lasix, will need re evaluation.  Overall euvolemic today (she had increased lasix for the last several days)

## 2019-11-09 NOTE — Assessment & Plan Note (Addendum)
With noted weight gain and hypoxia, anticipate acute exacerbation of CHF - she has already started treatment at home with increasing lasix frequency with noted benefit. Update labwork today, continue lasix BID x 3 days then return to daily dosing.  ADDENDUM ==> with noted worsening kidney function, I recommended she decrease lasix to MWF dosing with planned rpt labs next week.

## 2019-11-09 NOTE — Assessment & Plan Note (Signed)
Increased O2 requirement noted - now doing better - off oxygen.

## 2019-11-09 NOTE — Assessment & Plan Note (Addendum)
Stable period without falls. Continues statin and aspirin and plavix

## 2019-11-09 NOTE — Assessment & Plan Note (Signed)
Continue advair and PRN albuterol - stable period.

## 2019-11-13 ENCOUNTER — Telehealth: Payer: Self-pay | Admitting: Family Medicine

## 2019-11-13 NOTE — Chronic Care Management (AMB) (Signed)
.   Chronic Care Management   Note  11/13/2019 Name: [name] [name] MRN: [id number] DOB: [birth year]  [name] [name] is a 74 y.o. year old female who is a primary care patient of Ria Bush, MD. I reached out to [name] by phone today in response to a referral sent by [name]'s PCP, Ria Bush, MD. Patient's daughter, [name] gave verbal consent, and will assist her mother with phone visit.  [name] was given information about Chronic Care Management services today including:  1. CCM service includes personalized support from designated clinical staff supervised by her physician, including individualized plan of care and coordination with other care providers 2. 24/7 contact phone numbers for assistance for urgent and routine care needs. 3. Service will only be billed when office clinical staff spend 20 minutes or more in a month to coordinate care. 4. Only one practitioner may furnish and bill the service in a calendar month. 5. The patient may stop CCM services at any time (effective at the end of the month) by phone call to the office staff. 6. The patient will be responsible for cost sharing (co-pay) of up to 20% of the service fee (after annual deductible is met).  Patient agreed to services and verbal consent obtained.   Follow up plan:   [name] [employer] Scheduler

## 2019-11-14 ENCOUNTER — Other Ambulatory Visit: Payer: Medicare HMO

## 2019-11-16 IMAGING — MR MR MRA HEAD W/O CM
10 of 16 series · 28 of 48 positions shown · IV contrast (gadavist)
Comparison: Cerebral arteriogram 12/10/2018. CT head without
contrast 01/24/2019. MR head without contrast 08/10/2018

CLINICAL DATA: Right M1 segment stenosis. Stenosis of the cavernous
left ICA. 50% stenosis of the supraclinoid internal carotid arteries
bilaterally.

EXAM:
MRI HEAD WITHOUT AND WITH CONTRAST
MRA HEAD WITHOUT CONTRAST
TECHNIQUE: Multiplanar, multiecho pulse sequences of the brain and surrounding
structures were obtained without and with intravenous contrast.
Angiographic images of the head were obtained using MRA technique
without contrast.
CONTRAST:  7mL GADAVIST GADOBUTROL 1 MMOL/ML IV SOLN

[Series 3: DWI · axial · 3.0mm · 1.09mm/px · z∈[-71,+73]mm · 6 of 98 slices shown (1 of 4)]
[im 1/98]
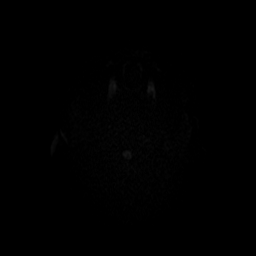
[im 20/98]
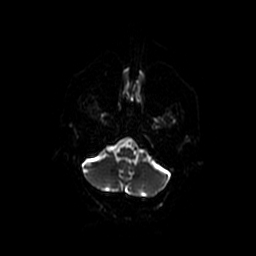
[im 39/98]
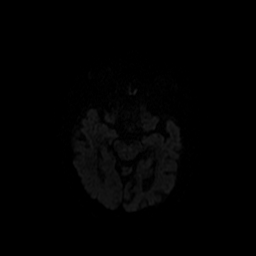
[im 59/98]
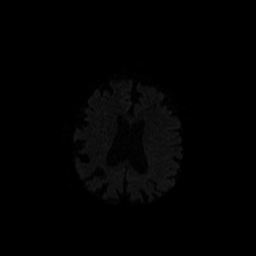
[im 78/98]
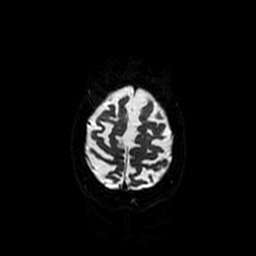
[im 98/98]
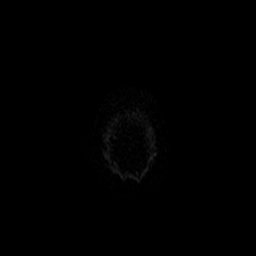

[Series 5: FLAIR · axial · 5.0mm · 0.43mm/px · z∈[-74,+70]mm · 2 of 25 slices shown]
[im 1/25]
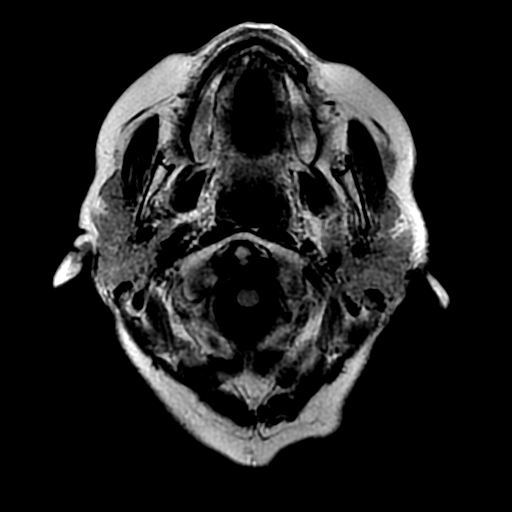
[im 25/25]
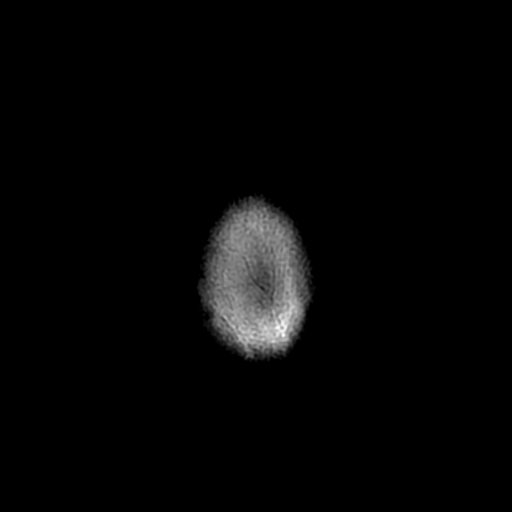

[Series 7: DWI · coronal · 5.0mm · 1.09mm/px · 5 of 68 slices shown (2 of 4)]
[im 1/68]
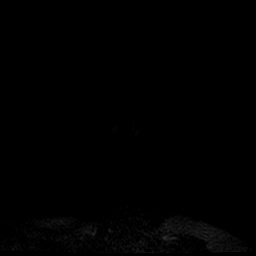
[im 17/68]
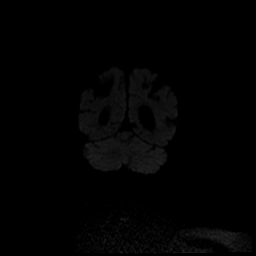
[im 34/68]
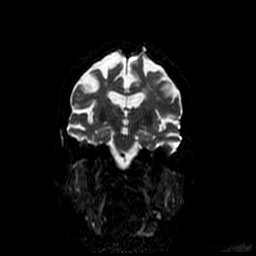
[im 51/68]
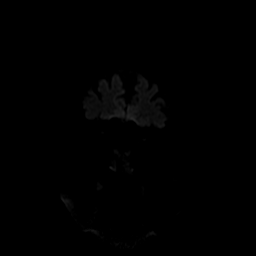
[im 68/68]
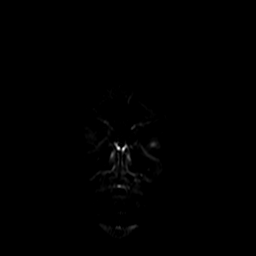

[Series 8: T1 · sagittal · 5.0mm · 0.47mm/px · 1 of 23 slices shown]
[im 1/23]
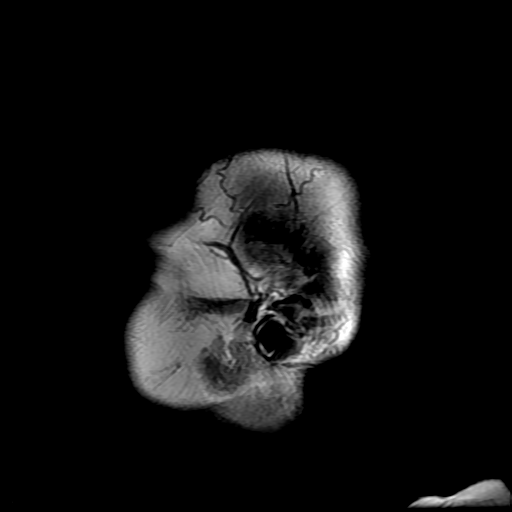

[Series 9: T2 · axial · 5.0mm · 0.43mm/px · z∈[-74,+70]mm · 2 of 25 slices shown (1 of 2)]
[im 1/25]
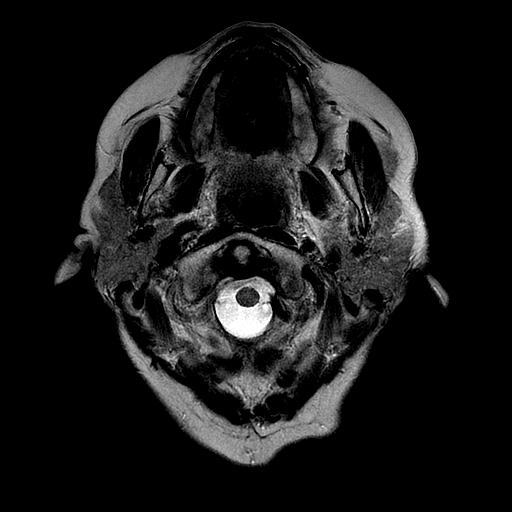
[im 25/25]
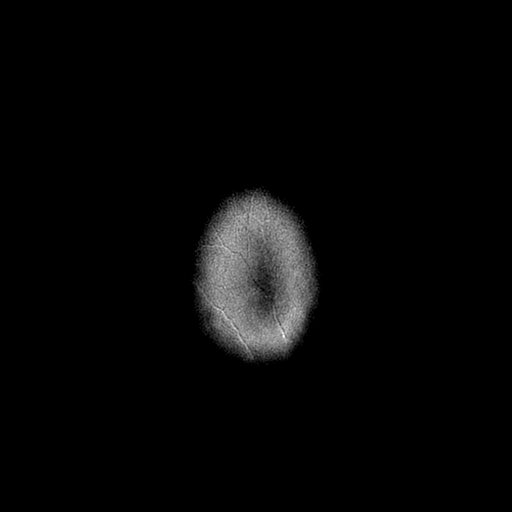

[Series 11: T1 post-contrast · axial · 3.0mm · 0.47mm/px · z∈[-74,+72]mm · 3 of 50 slices shown (1 of 2)]
[im 1/50]
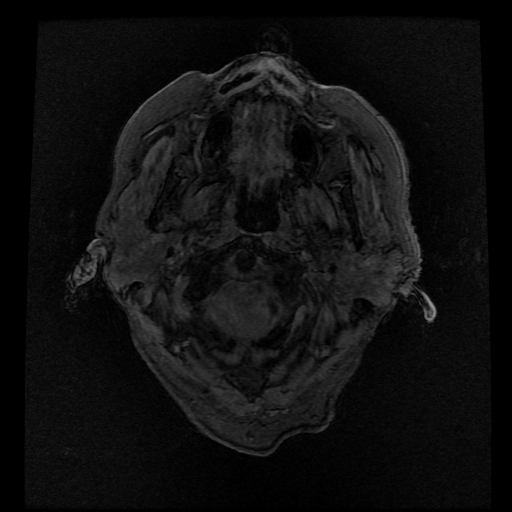
[im 25/50]
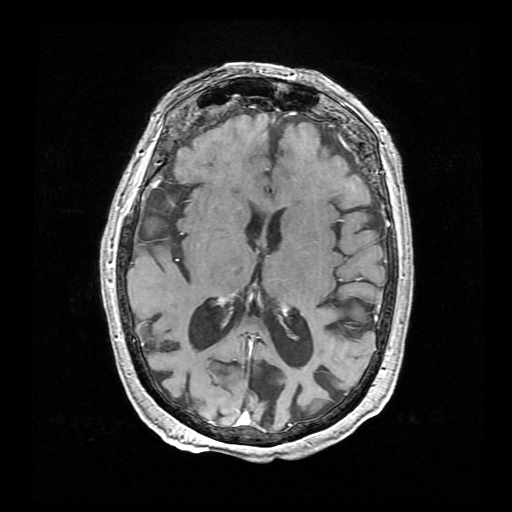
[im 50/50]
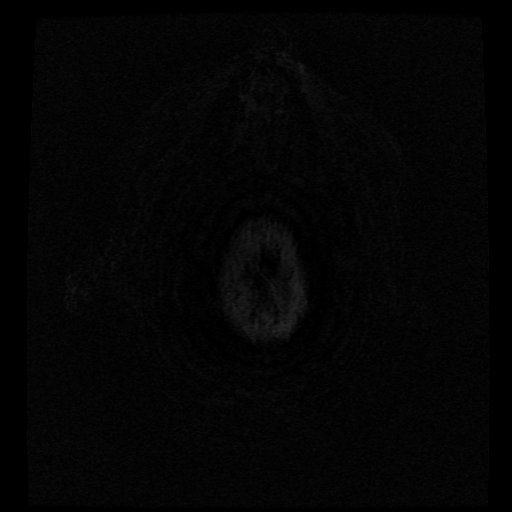

[Series 12: T2 · coronal · 5.0mm · 0.43mm/px · 2 of 29 slices shown (2 of 2)]
[im 1/29]
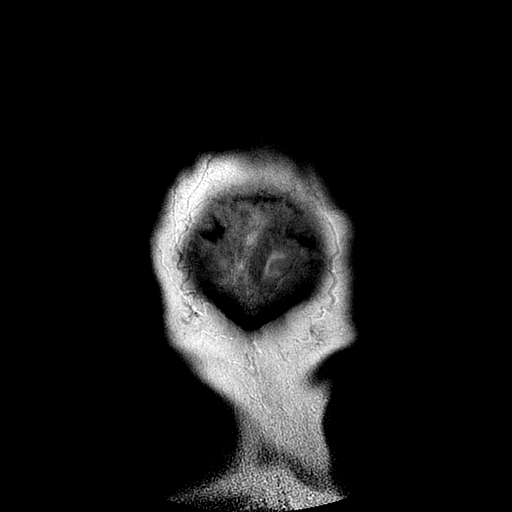
[im 29/29]
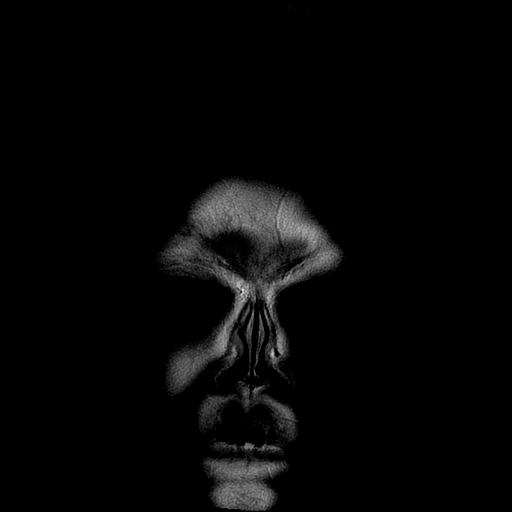

[Series 13: T1 post-contrast · coronal · 5.0mm · 0.43mm/px · 2 of 27 slices shown (2 of 2)]
[im 1/27]
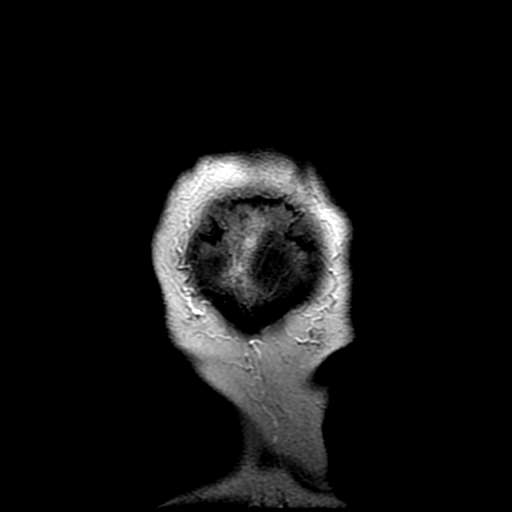
[im 27/27]
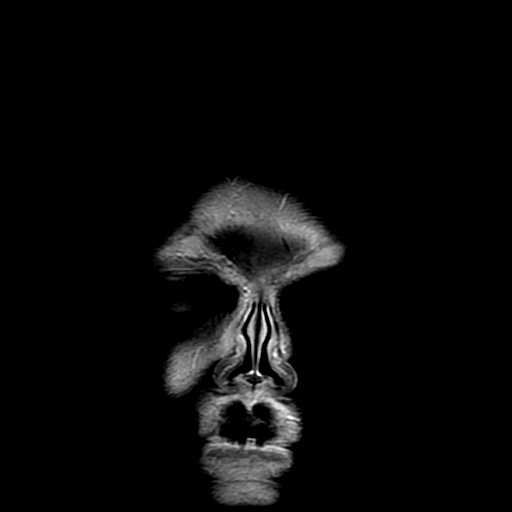

[Series 300: DWI · axial · 3.0mm · 1.09mm/px · z∈[-71,+73]mm · 3 of 49 slices shown (3 of 4)]
[im 1/49]
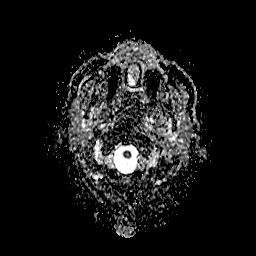
[im 25/49]
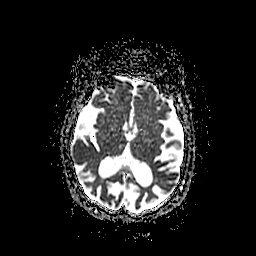
[im 49/49]
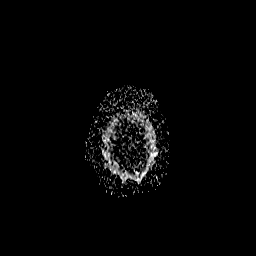

[Series 700: DWI · coronal · 5.0mm · 1.09mm/px · 2 of 34 slices shown (4 of 4)]
[im 1/34]
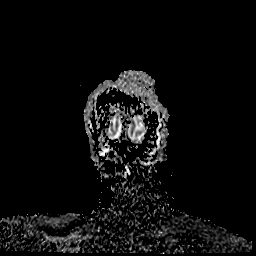
[im 34/34]
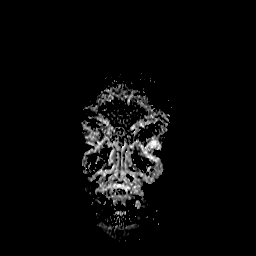

[28 of 48 positions shown; findings below may reference images not displayed]

FINDINGS: MRI HEAD FINDINGS

Brain: Advanced atrophy is again noted bilaterally. There is
bilateral frontal and parietal atrophy with relative preservation of
the temporal lobes. No acute infarct, hemorrhage, or mass lesion is
present. The ventricles are proportionate to the degree of atrophy.
No significant extraaxial fluid collection is present. Moderate
periventricular and subcortical T2 hyperintensities bilaterally are
stable.

Postcontrast images demonstrate no pathologic enhancement.

The internal auditory canals are within normal limits. Remote
lacunar infarcts of the cerebellum bilaterally are stable. Brainstem
and cerebellum are otherwise within normal limits.

Vascular: Flow is present in the major intracranial arteries.

Skull and upper cervical spine: The craniocervical junction is
normal. Upper cervical spine is within normal limits. Marrow signal
is unremarkable.

Sinuses/Orbits: Minimal mucosal thickening is present in the ethmoid
air cells. There is some fluid in the mastoid air cells bilaterally.
No obstructing nasopharyngeal lesion is present. The paranasal
sinuses and mastoid air cells are otherwise clear. Bilateral lens
replacements are noted. Globes and orbits are otherwise
unremarkable.

MRA HEAD FINDINGS

Atherosclerotic irregularity involving the cavernous left internal
carotid artery is stable. Supraclinoid segments are unremarkable.
Moderate right M1 stenosis just proximal to an early inferior M2
branch is similar the prior study. The anterior communicating artery
is patent. The left A1 and M1 segments are normal. There is
segmental attenuation of distal MCA branch vessels bilaterally
without other or progressive proximal stenosis.

The left vertebral artery is the dominant vessel. Left PICA
originates below the field of view. The right AICA is dominant.
Basilar artery is normal. Both posterior cerebral arteries originate
from basilar tip. There is some asymmetric attenuation of the left
PCA without significant proximal stenosis or occlusion.
IMPRESSION: 1. Stable advanced atrophy and diffuse white matter disease. This
likely reflects the sequela of chronic microvascular ischemia.
Pattern also suggests the possibility of a frontoparietal dementia.
2. No acute intracranial abnormality or significant interval change.
3. Remote lacunar infarcts of the cerebellum bilaterally are stable.
4. Moderate right M1 stenosis just proximal to an early inferior M2
branch is similar the prior study.
5. Atherosclerotic irregularity and narrowing of the cavernous left
ICA is stable.
6. Moderate distal small vessel disease without significant proximal
stenosis, aneurysm, or branch vessel occlusion within the [place].

## 2019-11-18 ENCOUNTER — Telehealth: Payer: Self-pay

## 2019-11-18 ENCOUNTER — Other Ambulatory Visit: Payer: Self-pay | Admitting: Family Medicine

## 2019-11-18 ENCOUNTER — Other Ambulatory Visit (INDEPENDENT_AMBULATORY_CARE_PROVIDER_SITE_OTHER): Payer: Medicare HMO

## 2019-11-18 DIAGNOSIS — J9611 Chronic respiratory failure with hypoxia: Secondary | ICD-10-CM

## 2019-11-18 DIAGNOSIS — R0602 Shortness of breath: Secondary | ICD-10-CM

## 2019-11-18 DIAGNOSIS — I5032 Chronic diastolic (congestive) heart failure: Secondary | ICD-10-CM

## 2019-11-18 DIAGNOSIS — N179 Acute kidney failure, unspecified: Secondary | ICD-10-CM | POA: Diagnosis not present

## 2019-11-18 DIAGNOSIS — I10 Essential (primary) hypertension: Secondary | ICD-10-CM

## 2019-11-18 DIAGNOSIS — J432 Centrilobular emphysema: Secondary | ICD-10-CM

## 2019-11-18 DIAGNOSIS — J449 Chronic obstructive pulmonary disease, unspecified: Secondary | ICD-10-CM | POA: Diagnosis not present

## 2019-11-18 DIAGNOSIS — R269 Unspecified abnormalities of gait and mobility: Secondary | ICD-10-CM | POA: Diagnosis not present

## 2019-11-18 NOTE — Telephone Encounter (Signed)
Plavix Last filled:  09/23/19, #90 Last OV:  11/05/19, acute SOB Next OV:  01/07/20, 6 mo f/u

## 2019-11-18 NOTE — Telephone Encounter (Signed)
I would like to request a referral for [name] [name] to chronic care management pharmacy services for the following conditions:   Essential hypertension, benign  [I10]  COPD [J44.9]  Phil Dopp, PharmD Clinical Pharmacist [facility] [phone]

## 2019-11-18 NOTE — Chronic Care Management (AMB) (Signed)
Chronic Care Management Pharmacy  Name: [name] [name]  MRN: [id number] DOB: 1945-10-18  Chief Complaint/ HPI  [name] [name],  74 y.o., female presents for their Initial CCM visit with the clinical pharmacist via telephone.  PCP : Ria Bush, MD  Their chronic conditions include: hypertension, peripheral vascular disease, heart failure, COPD, hypothyroidism, neuropathy, osteoarthritis, osteoporosis, CKD, hyperlipidemia, major depressive disorder, insomnia, vitamin D deficiency, chronic back pain  Patient concerns: ongoing fluid retention with Lasix MWF, difficulty breathing, using oxygen during the day in addition to night  Office Visits:  11/05/19: Danise Mina - Acute AKI/CHF exacerbation, may need re-eval by cardiology, reduce Lasix to MWF due to worsening kidney function, labs next week  07/08/19: Danise Mina [facility] - Recommended getting calcium from diet for osteoporosis rather than supplementation due to artery calcification, consider adding Zetia to statin for LDL < 70, schedule mammogram, follow up on CT for lung cancer, flu shot today, recommend Shingrix    06/11/19: Danise Mina - UTI - Cipro BID x 1 week, refer to urology   Consult Visit:  11/05/19: Urology - Recurrent UTI  --> continue current medications, consider Myrbetriq if urinary symptoms continue   08/07/19: Urology - Recurrent UTIs --> start trimethoprim daily, cranberry BID, renal US, RTC 3 months   Allergies  Allergen Reactions  . Doxycycline Other (See Comments)    Sig GI upset   Medications: Outpatient Encounter Medications as of 11/19/2019  Medication Sig  . acetaminophen (TYLENOL) 500 MG tablet Take 1 tablet (500 mg total) by mouth every 6 (six) hours as needed for headache.  . albuterol (PROVENTIL HFA;VENTOLIN HFA) 108 (90 Base) MCG/ACT inhaler Inhale 2 puffs into the lungs every 6 (six) hours as needed for wheezing or shortness of breath.  [name] aspirin (ASPIRIN 81) 81 MG EC tablet Take 1 tablet (81 mg  total) by mouth daily. Swallow whole.  [name] atorvastatin (LIPITOR) 40 MG tablet TAKE 1 TABLET EVERY DAY  . benazepril (LOTENSIN) 5 MG tablet Take 1 tablet (5 mg total) by mouth daily.  [name] CALCIUM-VITAMIN D PO Take 1 tablet by mouth daily.  . Cholecalciferol (VITAMIN D3) 25 MCG (1000 UT) CAPS Take 1,000 Units by mouth daily.  . clopidogrel (PLAVIX) 75 MG tablet TAKE 1 TABLET (75 MG TOTAL) BY MOUTH DAILY.  . fluticasone-salmeterol (ADVAIR HFA) 115-21 MCG/ACT inhaler Inhale 2 puffs into the lungs 2 (two) times daily. Rinse mouth after use.  . furosemide (LASIX) 40 MG tablet TAKE 1 TABLET (40 MG TOTAL) BY MOUTH DAILY AS NEEDED FOR FLUID OR EDEMA.  [name] gabapentin (NEURONTIN) 100 MG capsule Take 1 capsule (100 mg total) by mouth at bedtime.  [name] levothyroxine (SYNTHROID) 50 MCG tablet TAKE 1 TABLET  DAILY BEFORE BREAKFAST.  . Multiple Vitamins-Minerals (MULTIVITAMIN PO) Take 1 tablet by mouth daily.  . OXYGEN Inhale 2 L into the lungs at bedtime.  . pantoprazole (PROTONIX) 40 MG tablet Take 1 tablet (40 mg total) by mouth daily. For 2 weeks then as needed  . Potassium Chloride ER 20 MEQ TBCR TAKE 2 TABLETS BY MOUTH AS NEEDED (WHENEVER  YOU  TAKE  LASIX)  . sertraline (ZOLOFT) 100 MG tablet TAKE 1 TABLET EVERY DAY  . traMADol (ULTRAM) 50 MG tablet TAKE 1 TO 2 TABLETS BY MOUTH THREE TIMES DAILY AS NEEDED FOR MODERATE PAIN  . traZODone (DESYREL) 100 MG tablet TAKE 1 TABLET AT BEDTIME  . trimethoprim (TRIMPEX) 100 MG tablet Take 1 tablet (100 mg total) by mouth daily. Take daily to prevent UTI  No facility-administered encounter medications on file as of 11/19/2019.    Current Diagnosis/Assessment: Goals    . health management     Starting 06/27/2018, I will continue to take medications as prescribed.     . Pharmacy Care Plan     Current Barriers:  . Chronic Disease Management support, education, and care coordination needs related to hypertension, peripheral vascular disease, heart failure, COPD,  hypothyroidism, neuropathy, osteoarthritis, osteoporosis, CKD, hyperlipidemia, major depressive disorder, insomnia, vitamin D deficiency, chronic back pain  Pharmacist Clinical Goal(s):  [name] Improve shortness of breath and swelling. Pharmacist will consult with Dr. Sharen Hones regarding increasing your Lasix and referring you to a cardiologist. Will follow up with a telephone call for update. . Reduce cardiovascular risk by reaching cholesterol goal of LDL < 70 mg/dL. Consult with Dr. Sharen Hones for additional therapy. . Improve anxiety and sleep. Recommend limiting caffeine and avoiding after lunch. Will discuss increasing trazodone with Dr. Sharen Hones. . Achieve daily recommended calcium intake from diet, 1200 mg. Sources of calcium include milk, yogurt, cheese, and green vegetables.  . Remain up to date on vaccinations. Recommend Shingrix (shingles) vaccines from your local pharmacy.  Interventions: . Comprehensive medication review performed.  Patient Self Care Activities:  . Self administers medications as prescribed  Initial goal documentation      COPD / Tobacco   Last spirometry score: 12/2012 Post-FEV1 69% Gold Grade: Gold 2 (FEV1 50-79%) Current COPD Classification:  Complicated by CHF, unable to assess  Eosinophil count:   Lab Results  Component Value Date/Time   EOSPCT 1.7 11/05/2019 02:38 PM  %                               Eos (Absolute):  Lab Results  Component Value Date/Time   EOSABS 0.2 11/05/2019 02:38 PM   Tobacco Status: Denies current use  Social History   Tobacco Use  Smoking Status Former Smoker  . Packs/day: 2.00  . Years: 46.00  . Pack years: 92.00  . Types: Cigarettes  . Quit date: 11/08/2012  . Years since quitting: 7.0  Smokeless Tobacco Never Used   Patient has failed these meds in past: none  Patient is currently controlled on the following medications:   Albuterol 90 mcg - 2 puffs every 6 hours as needed  Advair 115-21 mcg - Inhale 2 puffs  twice daily and rinse  Supplemental Oxygen - prescribed at bedtime, but using during the day lately  Using maintenance inhaler regularly? Yes Frequency of rescue inhaler use: more than once daily  Plan: Continue current medications; Reassess COPD control once edema/CHF is better managed.   Heart Failure   CMP Latest Ref Rng & Units 11/18/2019 11/05/2019 07/01/2019  Glucose 70 - 99 mg/dL 93 008(Q) 95  BUN 6 - 23 mg/dL 23 76(P) 19  Creatinine 0.40 - 1.20 mg/dL 9.50 9.32(I) 7.12  Sodium 135 - 145 mEq/L 138 138 139  Potassium 3.5 - 5.1 mEq/L 4.5 4.6 4.1  Chloride 96 - 112 mEq/L 101 101 104  CO2 19 - 32 mEq/L 28 28 27   Calcium 8.4 - 10.5 mg/dL 9.5 9.2 9.1  Total Protein 6.0 - 8.3 g/dL - - 7.0  Total Bilirubin 0.2 - 1.2 mg/dL - - 0.4  Alkaline Phos 39 - 117 U/L - - 117  AST 0 - 37 U/L - - 21  ALT 0 - 35 U/L - - 14   Type: Diastolic Recent exacerbations: 11/05/19 (no  hospitalization)  Symptoms: unable to walk to mailbox without SOB  Last ejection fraction: 07/31/18 60-65% NYHA Class: III (marked limitation of activity) AHA HF Stage: C (Heart disease and symptoms present)  Patient has failed these meds in past: none  Patient is currently uncontrolled on the following medications:   Furosemide 40 mg - 1 tablet daily as needed (M,W,F)  Potassium Chloride ER 20 mEq - 2 tablets daily PRN with Lasix   We discussed weighing daily; if you gain more than 3 pounds in one day or 5 pounds in one week call your doctor  Plan: Continue current medications. Consider increasing Lasix back to once daily as kidney function is returning to baseline. Patient would like to re-establish care with cardiology, preferably within a [facility]. ,  Hypertension   Office blood pressures are: BP Readings from Last 3 Encounters:  11/05/19 116/64  11/05/19 132/74  08/07/19 138/79   Patient has failed these meds in the past: none Patient checks BP at home weekly Patient home BP readings are ranging:  usually 'good' BP today is: self monitored at home 129/62 mmHg, 100 BPM  Patient is currently controlled on the following medications:   Benazepril 5 mg - 1 tablet daily  Plan: Continue current medications   Hyperlipidemia/CAD   CBC Latest Ref Rng & Units 11/05/2019 07/01/2019 01/25/2019  WBC 4.0 - 10.5 K/uL 11.0(H) 5.8 5.1  Hemoglobin 12.0 - 15.0 g/dL 67.6 19.5 10.8(L)  Hematocrit 36.0 - 46.0 % 38.7 38.4 33.2(L)  Platelets 150.0 - 400.0 K/uL 218.0 169.0 134(L)   Lipid Panel     Component Value Date/Time   CHOL 237 (H) 07/01/2019 0954   TRIG 75.0 07/01/2019 0954   HDL 65.50 07/01/2019 0954   CHOLHDL 4 07/01/2019 0954   VLDL 15.0 07/01/2019 0954   LDLCALC 157 (H) 07/01/2019 0954   LDLDIRECT 84.0 09/11/2017 1107    LDL goal < 70 History of CVA (3) with right sided weakness; stent placed 12/10/18 Patient has failed these meds in past: none reported Patient is currently controlled on the following medications:   Atorvastatin 40 mg - 1 tablet daily (AM)  Aspirin 81 mg - 1 tablet daily (AM)  Plavix 75 mg - 1 tablet daily (AM)  We discussed: [name], MD for aneurism/stenting, we discussed adding on ezetimibe to high intensity statin due to LDL > 70  Plan: Consult with Dr. Sharen Hones about adding ezetimibe to atorvastatin 40 mg as patient confirmed adherence to atorvastatin daily.   Neuropathy/Chronic Pain   Patient has failed these meds in past: none reported Patient is currently controlled on the following medications:  Gabapentin 100 mg - 1 capsule at bedtime  Tramadol 50 mg - 1-2 tablets TID as needed  OTC: Tylenol 325 mg, 2 tablets as needed  We discussed: well controlled, tramadol once weekly, takes tylenol before taking tramadol and it usually controls pain  Plan: Continue current medications   Hypothyroidism   TSH WNL 05/06/19 Patient has failed these meds in past: none Patient is currently controlled on the following medications:  Levothyroxine 50  mcg - 1 tablet daily before breakfast  Plan: Continue current medications   MDD/Insomnia    Patient has failed these meds in past: none Patient is currently uncontrolled on the following medications:   Sertraline 100 mg - 1 tablet daily  Trazodone 100 mg - 1 tablet daily (wants to increase 1.5 tabs)  We discussed: not falling asleep quite as quickly, was doing better with trazodone1.5 tabs qhs (  reports dose was decreased because her daughter requested it); would like to consider sertraline dose increase or alternative for anxiety PRN, anxiety is not controlled with current medications. She does have a history of falls, but denies any recently. Will consider trazodone does increase and assess GAD-7 at 1 month follow up prior to considering sertraline changes.  Plan: Consult with Dr. Sharen Hones for trazodone dose increase.  Vitamin D Deficiency/Osteoporosis   Vitamin D (07/01/19): 38; Calcium WNL Patient has failed these meds in past: none Patient is currently controlled on the following medications:   Vitamin D3 5000 IU - 1 capsule daily   Fosamax - 1 tablet weekly (not taking)  Calcium/Vitamin D - 1 tablet daily (not taking)  We discussed: Patient denies taking Fosamax, seems per history, it may have been discontinued after 5 years, however notes differ. Will follow up with Dr. Reece Agar to see if patient should resume. Patient was instructed to increase dietary calcium intake and discontinue calcium supplement at previous AWV. Reminded patient about calcium rich foods.   Plan: Continue current medications  Recurrent UTI   Followed by Urology Patient has failed these meds in past: none Patient is currently controlled on the following medications:   Trimethoprim 100 mg - 1 tablet daily  Cranberry supplements BID  We discussed: denies adverse effects, denies UTI since starting trimethoprim  Plan: Continue current medications   Medication Management Misc/No diagnosis: Pantoprazole  40 mg - 1 tablet daily as needed  OTCs: Multivitamin, Zinc, vitamin C, Azo (cranberry)  [facility]delivered), all medications free except OTCs  Adherence: pillbox  Social support: lives with oldest daughter  Affordability: no concerns  Vaccines: Friday will receive first dose of COVID-19 vaccine; up to date on all except Shingrix    CCM Follow Up: Tuesday, December 17, 2019 at 10:30 AM  (telephone)  Phil Dopp, PharmD Clinical Pharmacist [facility] [phone]  -------------------------------- Dr. Sharen Hones,  Ms. Husain is still experiencing a lot of swelling and shortness of breath. She has increased her oxygen use to daytime in addition to nights. Her lasix was reduced to three days a week due to renal function which has nearly returned to baseline (Scr 0.95 -> 1.65 ->1.06). Would you like to increase the Lasix? She also requests a cardiology referral within [facility]. Last, she reports her trazodone 100 mg, 1 tablet qHs, was previously 1 and 1/2 tablets and has noticed a significant impact on her sleep with dose reduction. She would like to consider increasing back to 1 and 1/2 tablets (150 mg) qHs. I will follow up with her regarding any changes you would like to make.   Thank you for allowing me to be a part of her care.  Phil Dopp, PharmD

## 2019-11-19 ENCOUNTER — Other Ambulatory Visit: Payer: Self-pay

## 2019-11-19 ENCOUNTER — Ambulatory Visit: Payer: Medicare HMO

## 2019-11-19 DIAGNOSIS — M1990 Unspecified osteoarthritis, unspecified site: Secondary | ICD-10-CM

## 2019-11-19 DIAGNOSIS — I10 Essential (primary) hypertension: Secondary | ICD-10-CM

## 2019-11-19 DIAGNOSIS — I69351 Hemiplegia and hemiparesis following cerebral infarction affecting right dominant side: Secondary | ICD-10-CM

## 2019-11-19 DIAGNOSIS — F331 Major depressive disorder, recurrent, moderate: Secondary | ICD-10-CM

## 2019-11-19 DIAGNOSIS — G8929 Other chronic pain: Secondary | ICD-10-CM

## 2019-11-19 DIAGNOSIS — I739 Peripheral vascular disease, unspecified: Secondary | ICD-10-CM

## 2019-11-19 DIAGNOSIS — G629 Polyneuropathy, unspecified: Secondary | ICD-10-CM

## 2019-11-19 DIAGNOSIS — E039 Hypothyroidism, unspecified: Secondary | ICD-10-CM

## 2019-11-19 DIAGNOSIS — E559 Vitamin D deficiency, unspecified: Secondary | ICD-10-CM

## 2019-11-19 DIAGNOSIS — G4700 Insomnia, unspecified: Secondary | ICD-10-CM

## 2019-11-19 DIAGNOSIS — J432 Centrilobular emphysema: Secondary | ICD-10-CM

## 2019-11-19 DIAGNOSIS — J9611 Chronic respiratory failure with hypoxia: Secondary | ICD-10-CM

## 2019-11-19 DIAGNOSIS — I5032 Chronic diastolic (congestive) heart failure: Secondary | ICD-10-CM

## 2019-11-19 DIAGNOSIS — E782 Mixed hyperlipidemia: Secondary | ICD-10-CM

## 2019-11-19 DIAGNOSIS — M810 Age-related osteoporosis without current pathological fracture: Secondary | ICD-10-CM

## 2019-11-19 DIAGNOSIS — N1831 Chronic kidney disease, stage 3a: Secondary | ICD-10-CM

## 2019-11-19 LAB — RENAL FUNCTION PANEL
Albumin: 4.3 g/dL (ref 3.5–5.2)
BUN: 23 mg/dL (ref 6–23)
CO2: 28 mEq/L (ref 19–32)
Calcium: 9.5 mg/dL (ref 8.4–10.5)
Chloride: 101 mEq/L (ref 96–112)
Creatinine, Ser: 1.06 mg/dL (ref 0.40–1.20)
GFR: 50.79 mL/min — ABNORMAL LOW (ref >60.00)
Glucose, Bld: 93 mg/dL (ref 70–99)
Phosphorus: 3.3 mg/dL (ref 2.3–4.6)
Potassium: 4.5 mEq/L (ref 3.5–5.1)
Sodium: 138 mEq/L (ref 135–145)

## 2019-11-19 NOTE — Patient Instructions (Addendum)
November 19, 2019  Dear [name] [name],  It was a pleasure meeting you during our initial appointment on November 19, 2019. Below is a summary of the goals we discussed and components of chronic care management. Please contact me anytime with questions or concerns.   Visit Information  Goals Addressed            This Visit's Progress   . Pharmacy Care Plan       Current Barriers:  . Chronic Disease Management support, education, and care coordination needs related to hypertension, peripheral vascular disease, heart failure, COPD, hypothyroidism, neuropathy, osteoarthritis, osteoporosis, CKD, hyperlipidemia, major depressive disorder, insomnia, vitamin D deficiency, chronic back pain  Pharmacist Clinical Goal(s):  [name] Improve shortness of breath and swelling. Pharmacist will consult with Dr. Danise Mina regarding increasing your Lasix and referring you to a cardiologist. Will follow up with a telephone call for update. . Reduce cardiovascular risk by reaching cholesterol goal of LDL < 70 mg/dL. Consult with Dr. Danise Mina for additional therapy. . Improve anxiety and sleep. Recommend limiting caffeine and avoiding after lunch. Will discuss increasing trazodone with Dr. Danise Mina. . Achieve daily recommended calcium intake from diet, 1200 mg. Sources of calcium include milk, yogurt, cheese, and green vegetables.  . Remain up to date on vaccinations. Recommend Shingrix (shingles) vaccines from your local pharmacy.  Interventions: . Comprehensive medication review performed.  Patient Self Care Activities:  . Self administers medications as prescribed  Initial goal documentation       [name] [name] was given information about Chronic Care Management services today including:  1. CCM service includes personalized support from designated clinical staff supervised by her physician, including individualized plan of care and coordination with other care providers 2. 24/7 contact phone  numbers for assistance for urgent and routine care needs. 3. Service will only be billed when office clinical staff spend 20 minutes or more in a month to coordinate care. 4. Only one practitioner may furnish and bill the service in a calendar month. 5. The patient may stop CCM services at any time (effective at the end of the month) by phone call to the office staff. 6. The patient will be responsible for cost sharing (co-pay) of up to 20% of the service fee (after annual deductible is met).  Patient agreed to services and verbal consent obtained.   Telephone follow up appointment with pharmacy team member scheduled for: Tuesday, December 17, 2019 at 10:30 AM  (telephone)  Debbora Dus, PharmD Clinical Pharmacist [facility] [place] protect organs, store calcium, anchor muscles, and support the whole body. Keeping your bones strong is important, especially as you get older. You can take actions to help keep your bones strong and healthy. Why is keeping my bones healthy important?  Keeping your bones healthy is important because your body constantly replaces bone cells. Cells get old, and new cells take their place. As we age, we lose bone cells because the body may not be able to make enough new cells to replace the old cells. The amount of bone cells and bone tissue you have is referred to as bone mass. The higher your bone mass, the stronger your bones. The aging process leads to an overall loss of bone mass in the body, which can increase the likelihood of:  Joint pain and stiffness.  Broken bones.  A condition in which the bones become weak and brittle (osteoporosis). A large decline in bone mass occurs  in older adults. In women, it occurs about the time of menopause. What actions can I take to keep my bones healthy? Good health habits are important for maintaining healthy bones. This includes eating nutritious foods and exercising  regularly. To have healthy bones, you need to get enough of the right minerals and vitamins. Most nutrition experts recommend getting these nutrients from the foods that you eat. In some cases, taking supplements may also be recommended. Doing certain types of exercise is also important for bone health. What are the nutritional recommendations for healthy bones?  Eating a well-balanced diet with plenty of calcium and vitamin D will help to protect your bones. Nutritional recommendations vary from person to person. Ask your health care provider what is healthy for you. Here are some general guidelines. Get enough calcium Calcium is the most important (essential) mineral for bone health. Most people can get enough calcium from their diet, but supplements may be recommended for people who are at risk for osteoporosis. Good sources of calcium include:  Dairy products, such as low-fat or nonfat milk, cheese, and yogurt.  Dark green leafy vegetables, such as bok choy and broccoli.  Calcium-fortified foods, such as orange juice, cereal, bread, soy beverages, and tofu products.  Nuts, such as almonds. Follow these recommended amounts for daily calcium intake:  Children, age 777-3: 700 mg.  Children, age 83-8: 1,000 mg.  Children, age 37-13: 1,300 mg.  Teens, age 74-18: 1,300 mg.  Adults, age 25-50: 1,000 mg.  Adults, age 33-70: ? Men: 1,000 mg. ? Women: 1,200 mg.  Adults, age 32 or older: 1,200 mg.  Pregnant and breastfeeding females: ? Teens: 1,300 mg. ? Adults: 1,000 mg. Get enough vitamin D Vitamin D is the most essential vitamin for bone health. It helps the body absorb calcium. Sunlight stimulates the skin to make vitamin D, so be sure to get enough sunlight. If you live in a cold climate or you do not get outside often, your health care provider may recommend that you take vitamin D supplements. Good sources of vitamin D in your diet include:  Egg yolks.  Saltwater fish.  Milk and  cereal fortified with vitamin D. Follow these recommended amounts for daily vitamin D intake:  Children and teens, age 777-18: 600 international units.  Adults, age 57 or younger: 400-800 international units.  Adults, age 27 or older: 800-1,000 international units. Get other important nutrients Other nutrients that are important for bone health include:  Phosphorus. This mineral is found in meat, poultry, dairy foods, nuts, and legumes. The recommended daily intake for adult men and adult women is 700 mg.  Magnesium. This mineral is found in seeds, nuts, dark green vegetables, and legumes. The recommended daily intake for adult men is 400-420 mg. For adult women, it is 310-320 mg.  Vitamin K. This vitamin is found in green leafy vegetables. The recommended daily intake is 120 mg for adult men and 90 mg for adult women. What type of physical activity is best for building and maintaining healthy bones? Weight-bearing and strength-building activities are important for building and maintaining healthy bones. Weight-bearing activities cause muscles and bones to work against gravity. Strength-building activities increase the strength of the muscles that support bones. Weight-bearing and muscle-building activities include:  Walking and hiking.  Jogging and running.  Dancing.  Gym exercises.  Lifting weights.  Tennis and racquetball.  Climbing stairs.  Aerobics. Adults should get at least 30 minutes of moderate physical activity on most days. Children should  get at least 60 minutes of moderate physical activity on most days. Ask your health care provider what type of exercise is best for you. How can I find out if my bone mass is low? Bone mass can be measured with an X-ray test called a bone mineral density (BMD) test. This test is recommended for all women who are age 63 or older. It may also be recommended for:  Men who are age 72 or older.  People who are at risk for osteoporosis  because of: ? Having bones that break easily. ? Having a long-term disease that weakens bones, such as kidney disease or rheumatoid arthritis. ? Having menopause earlier than normal. ? Taking medicine that weakens bones, such as steroids, thyroid hormones, or hormone treatment for breast cancer or prostate cancer. ? Smoking. ? Drinking three or more alcoholic drinks a day. If you find that you have a low bone mass, you may be able to prevent osteoporosis or further bone loss by changing your diet and lifestyle. Where can I find more information? For more information, check out the following websites:  [place]: AviationTales.fr  [employer] of Health: www.bones.SouthExposed.es  International Osteoporosis Foundation: Administrator.iofbonehealth.org Summary  The aging process leads to an overall loss of bone mass in the body, which can increase the likelihood of broken bones and osteoporosis.  Eating a well-balanced diet with plenty of calcium and vitamin D will help to protect your bones.  Weight-bearing and strength-building activities are also important for building and maintaining strong bones.  Bone mass can be measured with an X-ray test called a bone mineral density (BMD) test. This information is not intended to replace advice given to you by your health care provider. Make sure you discuss any questions you have with your health care provider. Document Revised: 10/09/2017 Document Reviewed: 10/09/2017 Elsevier Patient Education  2020 [employer].

## 2019-11-21 ENCOUNTER — Telehealth: Payer: Self-pay | Admitting: Family Medicine

## 2019-11-21 MED ORDER — TRAZODONE HCL 100 MG PO TABS: 150.0000 mg | ORAL_TABLET | Freq: Every day | ORAL | 1 refills | Status: DC

## 2019-11-21 NOTE — Telephone Encounter (Signed)
CCM visit reviewed - I would like her to increase lasix back to 40mg  daily - med list changed. I'd also like her to come in at her convenience for chest xray (ordered). She may walk in for this (through the back door). I will refer her to cardiology for further evaluation. She may also increase trazodone to 150mg  nightly. Thank you.

## 2019-11-21 NOTE — Telephone Encounter (Signed)
Lvm for pt's daughter, [name] [name] (on dpr), asking her to call back.   Need to relay Dr. Timoteo Expose message originally sent via pt's MyChart:  [name] [name], Your kidney function has improved - however due to ongoing leg swelling I want you to increase lasix to 40mg  every day. I'd also like you to come in at your convenience for a chest xray and will refer you to a cardiologist.  Needs to schedule lab visit for chest xray.

## 2019-11-21 NOTE — Telephone Encounter (Signed)
Patients daughter [name] calling regarding the patients labs results.   Also, she is wanting to know status of her referral to Cardiology. Pt is having increased swelling.   Please advise, thanks.

## 2019-11-21 NOTE — Telephone Encounter (Signed)
[name] [name] returned [name] [name]'s call.  I  Advised her of Dr.G's comments. They will increase medication to 40 mg and I gave her the times they can come by for chest x-ray and did covid screening.  [name] [name] said they can't get on to my chart.

## 2019-11-21 NOTE — Addendum Note (Signed)
Addended by: [name] on: 11/21/2019 12:06 AM   Modules accepted: Orders

## 2019-11-21 NOTE — Telephone Encounter (Signed)
Noted  

## 2019-11-22 ENCOUNTER — Ambulatory Visit: Payer: Medicare HMO

## 2019-11-22 ENCOUNTER — Ambulatory Visit: Payer: Medicare HMO | Attending: Internal Medicine

## 2019-11-22 DIAGNOSIS — Z23 Encounter for immunization: Secondary | ICD-10-CM | POA: Insufficient documentation

## 2019-11-22 NOTE — Progress Notes (Signed)
   Covid-19 Vaccination Clinic  Name:  [name] [name]    MRN: [id number] DOB: March 12, 1946  11/22/2019  [name] was observed post Covid-19 immunization for 15 minutes without incidence. She was provided with Vaccine Information Sheet and instruction to access the V-Safe system.   [name] was instructed to call 911 with any severe reactions post vaccine: [name] Difficulty breathing  . Swelling of your face and throat  . A fast heartbeat  . A bad rash all over your body  . Dizziness and weakness    Immunizations Administered    Name Date Dose VIS Date Route   Pfizer COVID-19 Vaccine 11/22/2019 11:24 AM 0.3 mL 09/06/2019 Intramuscular   Manufacturer: [employer], [employer]   Lot: [id number]   NDC: [address]-5

## 2019-11-25 ENCOUNTER — Encounter: Payer: Self-pay | Admitting: Cardiovascular Disease

## 2019-11-25 ENCOUNTER — Other Ambulatory Visit: Payer: Self-pay

## 2019-11-25 ENCOUNTER — Ambulatory Visit: Payer: Medicare HMO | Admitting: Cardiovascular Disease

## 2019-11-25 VITALS — BP 122/66 | HR 88 | Ht 59.0 in | Wt 186.0 lb

## 2019-11-25 DIAGNOSIS — I5032 Chronic diastolic (congestive) heart failure: Secondary | ICD-10-CM | POA: Diagnosis not present

## 2019-11-25 DIAGNOSIS — J432 Centrilobular emphysema: Secondary | ICD-10-CM | POA: Diagnosis not present

## 2019-11-25 DIAGNOSIS — E782 Mixed hyperlipidemia: Secondary | ICD-10-CM | POA: Diagnosis not present

## 2019-11-25 DIAGNOSIS — I350 Nonrheumatic aortic (valve) stenosis: Secondary | ICD-10-CM | POA: Diagnosis not present

## 2019-11-25 DIAGNOSIS — R0602 Shortness of breath: Secondary | ICD-10-CM | POA: Diagnosis not present

## 2019-11-25 NOTE — Patient Instructions (Addendum)
Goal LDL is <70, 60 is better  Echo next year for aortic valve stenosis, shortness of breath  Medication Instructions:  No changes  Lasix 20 mg daily for weigth 178 to 183 Weight >183, lasix 20 twice a day x 1 day until weight drops Weight <177, no lasix     If you need a refill on your cardiac medications before your next appointment, please call your pharmacy.    Lab work: No new labs needed   If you have labs (blood work) drawn today and your tests are completely normal, you will receive your results only by: [name] MyChart Message (if you have MyChart) OR . A paper copy in the mail If you have any lab test that is abnormal or we need to change your treatment, we will call you to review the results.   Testing/Procedures: No new testing needed   Follow-Up: At [facility], you and your health needs are our priority.  As part of our continuing mission to provide you with exceptional heart care, we have created designated Provider Care Teams.  These Care Teams include your primary Cardiologist (physician) and Advanced Practice Providers (APPs -  Physician Assistants and Nurse Practitioners) who all work together to provide you with the care you need, when you need it.  . You will need a follow up appointment in 12 months , echo first   . Providers on your designated Care Team:   . Nicolasa Ducking, NP . Eula Listen, PA-C . Marisue Ivan, PA-C  Any Other Special Instructions Will Be Listed Below (If Applicable).  For educational health videos Log in to : [email or web address] Or : [email or web address], password : [email or web address]

## 2019-11-25 NOTE — Progress Notes (Signed)
Cardiology Office Note  Date:  11/25/2019   ID:  [name], [name] 06/08/1946, MRN [id number]  PCP:  [name], MD   Chief Complaint  Patient presents with  . New Patient (Initial Visit)    referred by PCP. Patient c.o swelling in feet/ankles and increased SOB. Meds reviewed verbally with patient.     HPI:  [name] is a 74 year old woman with past medical history of Hemiparesis affecting right side from stroke Carotid stenosis COPD, 40-year smoking history Mild aortic valve stenosis Who presents by referral from Dr. Sharen Hones for shortness of breath, weight gain, hypoxia,  Recently Treated with Lasix by primary care for chronic diastolic CHF, leg edema, weight gain, shortness of breath  Notes reviewed, indicating hypoxia on exertion saturations down to 89% up to 95% after resting  Weight at home 180, Here 186  Weight range 176 (dehydration) several weeks ago Weight 184, too much fluid, had worsening leg edema  Sedentary, not much walking Denies excessive fluid intake  EKG personally reviewed by myself on todays visit Shows normal sinus rhythm rate 88 bpm no significant ST-T wave changes  Echo 2019 - Left ventricle: estimated ejection fraction was in the range of 60%  to 65%. Wall motion was normal; there were no regional wall  motion abnormalities. Doppler parameters are consistent with  abnormal left ventricular relaxation (grade 1 diastolic  dysfunction). Indeterminate LV filling pressure.  - Aortic valve:severe thickening; severely thickened, moderately calcified leaflets. Valve mobility was moderately restricted. There was mild stenosis. mild regurgitation. Mean gradient (S): 9 mm  Hg. Valve area (VTI): 1.5 cm^2.   Stress test: low risk 01/2019   PMH:   has a past medical history of Aneurysm (HCC), Anxiety, Anxiety and depression, Carotid stenosis, Concussion (08/03/2015), COPD (chronic obstructive pulmonary disease) (HCC) (12/2012),  Depression, Fall (08/03/2015), Fracture of cervical vertebra, C5 (HCC) (08/06/2015), History of chicken pox, Hyperlipidemia, Hypertension, Lower back pain, Osteoarthritis, Osteoporosis (11/2010), Peripheral vascular disease (HCC), Smoker, and Stroke (HCC) (2010).  PSH:    Past Surgical History:  Procedure Laterality Date  . APPENDECTOMY  1960  . CATARACT EXTRACTION     bilateral  . CESAREAN SECTION    . CHOLECYSTECTOMY  1970  . COLONOSCOPY  2004   diverticulosis, no polyps Jarold Motto)  . COLONOSCOPY  05/2016   decreased sphincter tone, diverticulosis, no f/u recommended (Danis)  . DEXA  11/2010   T -2.7 spine, -1.9 hip  . HIP SURGERY Left 2006   fractured - screws placed  . IR ANGIO INTRA EXTRACRAN SEL COM CAROTID INNOMINATE BILAT MOD SED  08/01/2018  . IR ANGIO INTRA EXTRACRAN SEL COM CAROTID INNOMINATE BILAT MOD SED  12/10/2018  . IR ANGIO VERTEBRAL SEL SUBCLAVIAN INNOMINATE UNI R MOD SED  08/01/2018  . IR ANGIO VERTEBRAL SEL SUBCLAVIAN INNOMINATE UNI R MOD SED  12/10/2018  . IR ANGIO VERTEBRAL SEL VERTEBRAL UNI L MOD SED  12/10/2018  . IR RADIOLOGIST EVAL & MGMT  01/09/2018  . KYPHOPLASTY  10/02/2017   Procedure: LUMBAR FOUR KYPHOPLASTY;  Surgeon: Coletta Memos, MD;  Location: [facility] OR;  Service: Neurosurgery;;  . RADIOLOGY WITH ANESTHESIA N/A 11/11/2015   Procedure: RADIOLOGY WITH ANESTHESIA;  Surgeon: Julieanne Cotton, MD;  Location: [facility] OR;  Service: Radiology;  Laterality: N/A;  . RADIOLOGY WITH ANESTHESIA N/A 12/10/2018   Procedure: STENTING;  Surgeon: Julieanne Cotton, MD;  Location: [facility] OR;  Service: Radiology;  Laterality: N/A;    Current Outpatient Medications  Medication Sig Dispense Refill  .  acetaminophen (TYLENOL) 500 MG tablet Take 1 tablet (500 mg total) by mouth every 6 (six) hours as needed for headache.    . albuterol (PROVENTIL HFA;VENTOLIN HFA) 108 (90 Base) MCG/ACT inhaler Inhale 2 puffs into the lungs every 6 (six) hours as needed for wheezing or shortness of breath. 1  Inhaler 2  . aspirin (ASPIRIN 81) 81 MG EC tablet Take 1 tablet (81 mg total) by mouth daily. Swallow whole.    [name] atorvastatin (LIPITOR) 40 MG tablet TAKE 1 TABLET EVERY DAY 90 tablet 1  . benazepril (LOTENSIN) 5 MG tablet Take 1 tablet (5 mg total) by mouth daily. 30 tablet 3  . CALCIUM-VITAMIN D PO Take 1 tablet by mouth daily.    . Cholecalciferol (VITAMIN D3) 125 MCG (5000 UT) CAPS Take by mouth daily.    . Cholecalciferol (VITAMIN D3) 25 MCG (1000 UT) CAPS Take 1,000 Units by mouth daily.    . clopidogrel (PLAVIX) 75 MG tablet TAKE 1 TABLET (75 MG TOTAL) BY MOUTH DAILY. 90 tablet 1  . Cranberry-Vitamin C (AZO CRANBERRY URINARY TRACT PO) Take by mouth in the morning and at bedtime.    . fluticasone-salmeterol (ADVAIR HFA) 115-21 MCG/ACT inhaler Inhale 2 puffs into the lungs 2 (two) times daily. Rinse mouth after use. 1 Inhaler 12  . furosemide (LASIX) 40 MG tablet Take 1 tablet (40 mg total) by mouth daily.    [name] gabapentin (NEURONTIN) 100 MG capsule Take 1 capsule (100 mg total) by mouth at bedtime. 90 capsule 1  . Homeopathic Products (ZINC COLD THERAPY PO) Take by mouth daily.    [name] levothyroxine (SYNTHROID) 50 MCG tablet TAKE 1 TABLET  DAILY BEFORE BREAKFAST. 90 tablet 1  . Multiple Vitamins-Minerals (MULTIVITAMIN PO) Take 1 tablet by mouth daily.    . OXYGEN Inhale 2 L into the lungs at bedtime.    . pantoprazole (PROTONIX) 40 MG tablet Take 1 tablet (40 mg total) by mouth daily. For 2 weeks then as needed 90 tablet 2  . Potassium Chloride ER 20 MEQ TBCR TAKE 2 TABLETS BY MOUTH AS NEEDED (WHENEVER  YOU  TAKE  LASIX) 180 tablet 2  . sertraline (ZOLOFT) 100 MG tablet TAKE 1 TABLET EVERY DAY 90 tablet 1  . traMADol (ULTRAM) 50 MG tablet TAKE 1 TO 2 TABLETS BY MOUTH THREE TIMES DAILY AS NEEDED FOR MODERATE PAIN 30 tablet 0  . traZODone (DESYREL) 100 MG tablet Take 1.5 tablets (150 mg total) by mouth at bedtime. 135 tablet 1  . trimethoprim (TRIMPEX) 100 MG tablet Take 1 tablet (100 mg total)  by mouth daily. Take daily to prevent UTI 90 tablet 1   No current facility-administered medications for this visit.    Allergies:   Doxycycline   Social History:  The patient  reports that she quit smoking about 7 years ago. Her smoking use included cigarettes. She has a 92.00 pack-year smoking history. She has never used smokeless tobacco. She reports that she does not drink alcohol or use drugs.   Family History:   family history includes CAD in her maternal aunt, mother, and sister; Cancer in her maternal grandmother and mother; Cirrhosis in her father; Diabetes in her maternal aunt; Hypertension in her daughter; Sudden death (age of onset: 60) in her father.    Review of Systems: Review of Systems  Constitutional: Negative.   HENT: Negative.   Respiratory: Positive for shortness of breath.   Cardiovascular: Negative.   Gastrointestinal: Negative.   Musculoskeletal: Negative.  Neurological: Negative.   Psychiatric/Behavioral: Negative.   All other systems reviewed and are negative.   PHYSICAL EXAM: VS:  BP 122/66 (BP Location: Right Arm, Patient Position: Sitting, Cuff Size: Normal)   Pulse 88   Ht 4\' 11"  (1.499 m)   Wt 186 lb (84.4 kg)   BMI 37.57 kg/m  , BMI Body mass index is 37.57 kg/m. GEN: Well nourished, well developed, in no acute distress HEENT: normal Neck: no JVD, carotid bruits, or masses Cardiac: RRR; no murmurs, rubs, or gallops,no edema  Respiratory:  clear to auscultation bilaterally, normal work of breathing GI: soft, nontender, nondistended, + BS MS: no deformity or atrophy Skin: warm and dry, no rash Neuro:  Strength and sensation are intact Psych: euthymic mood, full affect   Recent Labs: 01/24/2019: B Natriuretic Peptide 49.9 05/06/2019: TSH 2.30 07/01/2019: ALT 14 11/05/2019: Hemoglobin 12.7; Magnesium 1.8; Platelets 218.0; Pro B Natriuretic peptide (BNP) 32.0 11/18/2019: BUN 23; Creatinine, Ser 1.06; Potassium 4.5; Sodium 138    Lipid  Panel Lab Results  Component Value Date   CHOL 237 (H) 07/01/2019   HDL 65.50 07/01/2019   LDLCALC 157 (H) 07/01/2019   TRIG 75.0 07/01/2019     Wt Readings from Last 3 Encounters:  11/25/19 186 lb (84.4 kg)  11/05/19 184 lb 7 oz (83.7 kg)  11/05/19 181 lb (82.1 kg)      ASSESSMENT AND PLAN:  Problem List Items Addressed This Visit    COPD (chronic obstructive pulmonary disease) (HCC)   Shortness of breath   Relevant Orders   EKG 12-Lead    Other Visit Diagnoses    Chronic diastolic CHF (congestive heart failure) (HCC)    -  Primary   Morbid obesity (HCC)       Aortic valve stenosis, etiology of cardiac valve disease unspecified       Relevant Orders   ECHOCARDIOGRAM COMPLETE     Shortness of breath likely multifactorial Weight is higher, deconditioned, Long history of smoking 40 years, underlying COPD Although normal ejection fraction on prior echo, there is diastolic dysfunction, mild aortic valve stenosis 2 years ago --- Long discussion concerning titration of Lasix based on her weight She prefers to take 20 mg daily rather than 40 mg every other day We recommended the algorithm as below : Lasix 20 mg daily for weigth 178 to 183 Weight >183, lasix 20 twice a day x 1 day until weight drops Weight <177, no lasix  ----Lifestyle modification, weight loss recommended, conditioning --Prior records reviewed, stress test last year, will hold off on any other ischemic work-up at this time  Hyperlipidemia On Lipitor Goal LDL less than 70 given her carotid stenosis  PAD CT scan 2019 reviewed 50% right ICA origin stenosis, 50-55% left common carotid artery stenoses, severe right subclavian artery origin stenosis, and severe right and moderate left vertebral artery origin stenoses. --- Discussed with her, recommended aggressive cholesterol management  COPD On home oxygen at night Recommend she check ambulatory saturations Stressed the importance of weight  loss  Extensive review of records, prior hospitalizations, imaging studies pulled up and reviewed Family presents with her today, all questions answered  Total encounter time more than 2020  Greater than 50% was spent in counseling and coordination of care with the patient   Disposition:   F/U  12 months   Total encounter time more than 25 minutes  Greater than 50% was spent in counseling and coordination of care with the patient    Signed,  Rockey Situ, M.D., Ph.D. [place], [place]

## 2019-11-27 ENCOUNTER — Other Ambulatory Visit: Payer: Self-pay | Admitting: Family Medicine

## 2019-11-28 NOTE — Telephone Encounter (Signed)
Name of Medication: Tramadol Name of Pharmacy: [facility] Last Fill or Written Date and Quantity: 06/21/19, #30 Last Office Visit and Type: 11/05/19, SOB Next Office Visit and Type: 01/07/20, 6 mo f/u Last Controlled Substance Agreement Date: none Last UDS: none

## 2019-11-29 NOTE — Telephone Encounter (Signed)
ERx 

## 2019-12-16 DIAGNOSIS — J449 Chronic obstructive pulmonary disease, unspecified: Secondary | ICD-10-CM | POA: Diagnosis not present

## 2019-12-16 DIAGNOSIS — R269 Unspecified abnormalities of gait and mobility: Secondary | ICD-10-CM | POA: Diagnosis not present

## 2019-12-17 ENCOUNTER — Other Ambulatory Visit: Payer: Self-pay

## 2019-12-17 ENCOUNTER — Ambulatory Visit: Payer: Medicare HMO | Attending: Internal Medicine

## 2019-12-17 ENCOUNTER — Ambulatory Visit: Payer: Medicare HMO

## 2019-12-17 DIAGNOSIS — I10 Essential (primary) hypertension: Secondary | ICD-10-CM

## 2019-12-17 DIAGNOSIS — I6523 Occlusion and stenosis of bilateral carotid arteries: Secondary | ICD-10-CM

## 2019-12-17 DIAGNOSIS — Z23 Encounter for immunization: Secondary | ICD-10-CM

## 2019-12-17 DIAGNOSIS — G4700 Insomnia, unspecified: Secondary | ICD-10-CM

## 2019-12-17 DIAGNOSIS — I5032 Chronic diastolic (congestive) heart failure: Secondary | ICD-10-CM

## 2019-12-17 NOTE — Chronic Care Management (AMB) (Signed)
Chronic Care Management Pharmacy  Name: [name] [name]  MRN: [id number] DOB: 1946/09/14  Chief Complaint/ HPI  [name] [name],  74 y.o., female presents for their Follow-Up CCM visit with the clinical pharmacist via telephone.  PCP : Ria Bush, MD  Their chronic conditions addressed today include: hypertension, peripheral vascular disease, heart failure, CKD, hyperlipidemia, major depressive disorder, insomnia  ---> Changes since last CCM visit: PCP increased Lasix to daily and referred to cardiology; trazodone increased from 1 to 1 and 1/2 tablet qhs  Consult visits:  11/25/19: Cardiology, Dr. Rockey Situ: recommended Lasix dosing per patient weight below  Lasix 20 mg daily for weight 178 to 183  Weight >183, lasix 20 twice a day x 1 day until weight drops  Weight <177, no lasix   Current Diagnosis/Assessment: Goals    . health management     Starting 06/27/2018, I will continue to take medications as prescribed.     . Pharmacy Care Plan     Current Barriers:  . Chronic Disease Management support, education, and care coordination needs related to hypertension, peripheral vascular disease, heart failure, COPD, hypothyroidism, neuropathy, osteoarthritis, osteoporosis, CKD, hyperlipidemia, major depressive disorder, insomnia, vitamin D deficiency, chronic back pain  Pharmacist Clinical Goal(s):  [name] Improve shortness of breath and swelling. Recommend taking Lasix 40 mg - one half tablet daily as prescribed and increasing based on weight as directed by Dr. Rockey Situ below:  Lasix 20 mg daily for weight 178 to 183  Weight >183, lasix 20 twice a day x 1 day until weight drops  Weight <177, no lasix  . Reduce cardiovascular risk by reaching cholesterol goal of LDL < 70 mg/dL. Consult with Dr. Danise Mina for additional therapy. . Improve anxiety and sleep. Recommend limiting caffeine and avoiding after lunch. Continue trazodone one and one-half tablet at bedtime.  . Achieve  daily recommended calcium intake from diet, 1200 mg. Sources of calcium include milk, yogurt, cheese, and green vegetables.  . Remain up to date on vaccinations. Recommend Shingrix (shingles) vaccines from your local pharmacy.  Interventions: . Comprehensive medication review performed.  Patient Self Care Activities:  . Self administers medications as prescribed  Please see past updates related to this goal by clicking on the "Past Updates" button in the selected goal        Heart Failure   CMP Latest Ref Rng & Units 11/18/2019 11/05/2019 07/01/2019  Glucose 70 - 99 mg/dL 93 108(H) 95  BUN 6 - 23 mg/dL 23 30(H) 19  Creatinine 0.40 - 1.20 mg/dL 1.06 1.65(H) 0.95  Sodium 135 - 145 mEq/L 138 138 139  Potassium 3.5 - 5.1 mEq/L 4.5 4.6 4.1  Chloride 96 - 112 mEq/L 101 101 104  CO2 19 - 32 mEq/L 28 28 27   Calcium 8.4 - 10.5 mg/dL 9.5 9.2 9.1  Total Protein 6.0 - 8.3 g/dL - - 7.0  Total Bilirubin 0.2 - 1.2 mg/dL - - 0.4  Alkaline Phos 39 - 117 U/L - - 117  AST 0 - 37 U/L - - 21  ALT 0 - 35 U/L - - 14   Type: Diastolic Recent exacerbations: 11/05/19 (no hospitalization)  Symptoms: unable to walk to mailbox without SOB  Last ejection fraction: 07/31/18 60-65% NYHA Class: III (marked limitation of activity) AHA HF Stage: C (Heart disease and symptoms present)  Patient has failed these meds in past: none  Patient is currently controlled on the following medications:   Furosemide 40 mg - 1/2 tablet daily  Potassium Chloride ER 20 mEq - 2 tablets daily with Lasix   We discussed: pt reports she is feeling better, breathing easier, but some swelling remains in feet, she is weighing daily in the morning and current weight is stable at 187 lbs.; per patient's daughter, pt has not been using the recommended Lasix dosing based on weight provided by Dr. Mariah Milling --> as pt is above 183 lbs and swelling, recommended Lasix BID per cardio daily until weight returns to baseline (178-183 lbs)    Exercise: walking more, doing leg exercises 25 minutes every day, gets 4000 steps  Plan: Continue current medications. Follow increased Lasix dosing per weight recommendation provided by Dr. Mariah Milling.  Hypertension   Office blood pressures are: BP Readings from Last 3 Encounters:  11/25/19 122/66  11/05/19 116/64  11/05/19 132/74   BP goal < 140/90 mmHg Patient has failed these meds in the past: none Patient checks BP at home weekly Patient home BP readings are ranging: no changes, still doing well, around 130/70  Patient is currently controlled on the following medications:   Benazepril 5 mg - 1 tablet daily  Plan: Continue current medications   Hyperlipidemia/CAD   CBC Latest Ref Rng & Units 11/05/2019 07/01/2019 01/25/2019  WBC 4.0 - 10.5 K/uL 11.0(H) 5.8 5.1  Hemoglobin 12.0 - 15.0 g/dL 26.3 78.5 10.8(L)  Hematocrit 36.0 - 46.0 % 38.7 38.4 33.2(L)  Platelets 150.0 - 400.0 K/uL 218.0 169.0 134(L)   Lipid Panel     Component Value Date/Time   CHOL 237 (H) 07/01/2019 0954   TRIG 75.0 07/01/2019 0954   HDL 65.50 07/01/2019 0954   CHOLHDL 4 07/01/2019 0954   VLDL 15.0 07/01/2019 0954   LDLCALC 157 (H) 07/01/2019 0954   LDLDIRECT 84.0 09/11/2017 1107    LDL goal < 70 History of CVA (3) with right sided weakness; stent placed 12/10/18 Patient has failed these meds in past: none reported Patient is currently uncontrolled on the following medications:   Atorvastatin 40 mg - 1 tablet daily (AM)  Aspirin 81 mg - 1 tablet daily (AM)  Plavix 75 mg - 1 tablet daily (AM)  We discussed: [name], MD for aneurism/stenting and Dr. Mariah Milling, cardiology; discussed adding on ezetimibe to high intensity statin due to LDL > 70  Plan: Consult with Dr. Sharen Hones about adding ezetimibe to atorvastatin 40 mg as patient confirmed adherence to atorvastatin daily. Patient reports cardiology mentioned adding a new medication but no mention of this in note.   MDD/Insomnia     Patient has failed these meds in past: none Patient is currently controlled on the following medications:   Sertraline 100 mg - 1 tablet daily  Trazodone 100 mg - 1 and 1/2 tablet daily (increased at last CCM visit)  We discussed: reports falling asleep quickly and staying asleep; denies adverse effects with increased dose, no concerns about anxiety today  Plan: Continue current medications   Medication Management  OTCs: Multivitamin, Zinc, vitamin C, Azo (cranberry)  [facility]), all medications free except OTCs  Adherence: pillbox  Social support: lives with oldest daughter  Affordability: no concerns  Vaccines: 2nd COVID-19 vaccine today, up to date on all except Shingrix    CCM Follow Up: Feb 05, 2020 at 9:00 AM (telephone)  Phil Dopp, PharmD Clinical Pharmacist [facility] [phone]

## 2019-12-17 NOTE — Progress Notes (Signed)
   Covid-19 Vaccination Clinic  Name:  [name] [name]    MRN: [id number] DOB: 1945-11-10  12/17/2019  [name] was observed post Covid-19 immunization for 15 minutes without incident. She was provided with Vaccine Information Sheet and instruction to access the V-Safe system.   [name] was instructed to call 911 with any severe reactions post vaccine: [name] Difficulty breathing  . Swelling of face and throat  . A fast heartbeat  . A bad rash all over body  . Dizziness and weakness   Immunizations Administered    Name Date Dose VIS Date Route   Pfizer COVID-19 Vaccine 12/17/2019  1:16 PM 0.3 mL 09/06/2019 Intramuscular   Manufacturer: [employer], [employer]   Lot: [id number]   NDC: [address]-4

## 2019-12-19 ENCOUNTER — Telehealth: Payer: Self-pay

## 2019-12-19 MED ORDER — EZETIMIBE 10 MG PO TABS: 10.0000 mg | ORAL_TABLET | Freq: Every day | ORAL | 3 refills | Status: DC

## 2019-12-19 NOTE — Telephone Encounter (Signed)
Consult with PCP regarding hyperlipidemia (see CCM note 12/17/19):  Patient is open to starting additional therapy for cholesterol lowering. She confirms taking atorvastatin 40 mg daily in the morning. Targeting LDL goal < 70 due to CAD. I saw you previously discussed Zetia with Ms. Tatum. Would you like to start it now?  Lipid Panel     Component Value Date/Time   CHOL 237 (H) 07/01/2019 0954   TRIG 75.0 07/01/2019 0954   HDL 65.50 07/01/2019 0954   CHOLHDL 4 07/01/2019 0954   VLDL 15.0 07/01/2019 0954   LDLCALC 157 (H) 07/01/2019 0954   LDLDIRECT 84.0 09/11/2017 1107   Thanks!  Phil Dopp, PharmD Clinical Pharmacist [facility] [phone]

## 2019-12-19 NOTE — Telephone Encounter (Signed)
Thank you. Yes let's add zetia 10mg  to regimen - I have sent this in to her pharmacy.

## 2019-12-19 NOTE — Patient Instructions (Addendum)
Dear [name],  Below is a summary of the goals we discussed at our follow up telephone call on 12/17/19. Please contact me anytime with questions or concerns.   Visit Information  Goals Addressed            This Visit's Progress   . Pharmacy Care Plan       Current Barriers:  . Chronic Disease Management support, education, and care coordination needs related to hypertension, peripheral vascular disease, heart failure, COPD, hypothyroidism, neuropathy, osteoarthritis, osteoporosis, CKD, hyperlipidemia, major depressive disorder, insomnia, vitamin D deficiency, chronic back pain  Pharmacist Clinical Goal(s):  [name] Improve shortness of breath and swelling. Recommend taking Lasix 40 mg - one half tablet daily as prescribed and increasing based on weight as directed by Dr. Mariah Milling below:  Lasix 20 mg daily for weight 178 to 183  Weight >183, lasix 20 twice a day x 1 day until weight drops  Weight <177, no lasix  . Reduce cardiovascular risk by reaching cholesterol goal of LDL < 70 mg/dL. Consult with Dr. Sharen Hones for additional therapy. . Improve anxiety and sleep. Recommend limiting caffeine and avoiding after lunch. Continue trazodone one and one-half tablet at bedtime.  . Achieve daily recommended calcium intake from diet, 1200 mg. Sources of calcium include milk, yogurt, cheese, and green vegetables.  . Remain up to date on vaccinations. Recommend Shingrix (shingles) vaccines from your local pharmacy.  Interventions: . Comprehensive medication review performed.  Patient Self Care Activities:  . Self administers medications as prescribed  Please see past updates related to this goal by clicking on the "Past Updates" button in the selected goal       The patient verbalized understanding of instructions provided today and agreed to receive a mailed copy of patient instruction and/or educational materials.  Telephone follow up appointment with pharmacy team member scheduled  for: Feb 05, 2020 at 9:00 AM (telephone)  Phil Dopp, PharmD Clinical Pharmacist [facility] [phone]   Heart Failure, Self Care Heart failure is a serious condition. This sheet explains things you need to do to take care of yourself at home. To help you stay as healthy as possible, you may be asked to change your diet, take certain medicines, and make other changes in your life. Your doctor may also give you more specific instructions. If you have problems or questions, call your doctor. What are the risks? Having heart failure makes it more likely for you to have some problems. These problems can get worse if you do not take good care of yourself. Problems may include:  Blood clotting problems. This may cause a stroke.  Damage to the kidneys, liver, or lungs.  Abnormal heart rhythms. Supplies needed:  Scale for weighing yourself.  Blood pressure monitor.  Notebook.  Medicines. How to care for yourself when you have heart failure Medicines Take over-the-counter and prescription medicines only as told by your doctor. Take your medicines every day.  Do not stop taking your medicine unless your doctor tells you to do so.  Do not skip any medicines.  Get your prescriptions refilled before you run out of medicine. This is important. Eating and drinking   Eat heart-healthy foods. Talk with a diet specialist (dietitian) to create an eating plan.  Choose foods that: ? Have no trans fat. ? Are low in saturated fat and cholesterol.  Choose healthy foods, such as: ? Fresh or frozen fruits and vegetables. ? Fish. ? Low-fat (lean) meats. ?  Legumes, such as beans, peas, and lentils. ? Fat-free or low-fat dairy products. ? Whole-grain foods. ? High-fiber foods.  Limit salt (sodium) if told by your doctor. Ask your diet specialist to tell you which seasonings are healthy for your heart.  Cook in healthy ways instead of frying. Healthy ways of  cooking include roasting, grilling, broiling, baking, poaching, steaming, and stir-frying.  Limit how much fluid you drink, if told by your doctor. Alcohol use  Do not drink alcohol if: ? Your doctor tells you not to drink. ? Your heart was damaged by alcohol, or you have very bad heart failure. ? You are pregnant, may be pregnant, or are planning to become pregnant.  If you drink alcohol: ? Limit how much you use to:  0-1 drink a day for women.  0-2 drinks a day for men. ? Be aware of how much alcohol is in your drink. In the [place] one drink equals one 12 oz bottle of beer (355 mL), one 5 oz glass of wine (148 mL), or one 1 oz glass of hard liquor (44 mL). Lifestyle   Do not use any products that contain nicotine or tobacco, such as cigarettes, e-cigarettes, and chewing tobacco. If you need help quitting, ask your doctor. ? Do not use nicotine gum or patches before talking to your doctor.  Do not use illegal drugs.  Lose weight if told by your doctor.  Do physical activity if told by your doctor. Talk to your doctor before you begin an exercise if: ? You are an older adult. ? You have very bad heart failure.  Learn to manage stress. If you need help, ask your doctor.  Get rehab (rehabilitation) to help you stay independent and to help with your quality of life.  Plan time to rest when you get tired. Check weight and blood pressure   Weigh yourself every day. This will help you to know if fluid is building up in your body. ? Weigh yourself every morning after you pee (urinate) and before you eat breakfast. ? Wear the same amount of clothing each time. ? Write down your daily weight. Give your record to your doctor.  Check and write down your blood pressure as told by your doctor.  Check your pulse as told by your doctor. Dealing with very hot and very cold weather  If it is very hot: ? Avoid activities that take a lot of energy. ? Use air conditioning or fans, or  find a cooler place. ? Avoid caffeine and alcohol. ? Wear clothing that is loose-fitting, lightweight, and light-colored.  If it is very cold: ? Avoid activities that take a lot of energy. ? Layer your clothes. ? Wear mittens or gloves, a hat, and a scarf when you go outside. ? Avoid alcohol. Follow these instructions at home:  Stay up to date with shots (vaccines). Get pneumococcal and flu (influenza) shots.  Keep all follow-up visits as told by your doctor. This is important. Contact a doctor if:  You gain weight quickly.  You have increasing shortness of breath.  You cannot do your normal activities.  You get tired easily.  You cough a lot.  You don't feel like eating or feel like you may vomit (nauseous).  You become puffy (swell) in your hands, feet, ankles, or belly (abdomen).  You cannot sleep well because it is hard to breathe.  You feel like your heart is beating fast (palpitations).  You get dizzy when you stand up.  Get help right away if:  You have trouble breathing.  You or someone else notices a change in your behavior, such as having trouble staying awake.  You have chest pain or discomfort.  You pass out (faint). These symptoms may be an emergency. Do not wait to see if the symptoms will go away. Get medical help right away. Call your local emergency services (911 in the [place] Do not drive yourself to the hospital. Summary  Heart failure is a serious condition. To care for yourself, you may have to change your diet, take medicines, and make other lifestyle changes.  Take your medicines every day. Do not stop taking them unless your doctor tells you to do so.  Eat heart-healthy foods, such as fresh or frozen fruits and vegetables, fish, lean meats, legumes, fat-free or low-fat dairy products, and whole-grain or high-fiber foods.  Ask your doctor if you can drink alcohol. You may have to stop alcohol use if you have very bad heart failure.  Contact  your doctor if you gain weight quickly or feel that your heart is beating too fast. Get help right away if you pass out, or have chest pain or trouble breathing. This information is not intended to replace advice given to you by your health care provider. Make sure you discuss any questions you have with your health care provider. Document Revised: 12/25/2018 Document Reviewed: 12/26/2018 Elsevier Patient Education  [place].

## 2019-12-20 ENCOUNTER — Ambulatory Visit (INDEPENDENT_AMBULATORY_CARE_PROVIDER_SITE_OTHER)
Admission: RE | Admit: 2019-12-20 | Discharge: 2019-12-20 | Disposition: A | Discharge status: Home / Self Care | Payer: Medicare HMO | Source: Ambulatory Visit | Attending: Family Medicine | Admitting: Family Medicine

## 2019-12-20 ENCOUNTER — Other Ambulatory Visit: Payer: Self-pay

## 2019-12-20 ENCOUNTER — Encounter: Payer: Self-pay | Admitting: Family Medicine

## 2019-12-20 ENCOUNTER — Telehealth: Payer: Self-pay

## 2019-12-20 ENCOUNTER — Ambulatory Visit (INDEPENDENT_AMBULATORY_CARE_PROVIDER_SITE_OTHER): Payer: Medicare HMO | Admitting: Family Medicine

## 2019-12-20 VITALS — BP 124/66 | HR 100 | Temp 97.7°F | Ht 59.0 in

## 2019-12-20 DIAGNOSIS — J9611 Chronic respiratory failure with hypoxia: Secondary | ICD-10-CM | POA: Diagnosis not present

## 2019-12-20 DIAGNOSIS — R296 Repeated falls: Secondary | ICD-10-CM | POA: Diagnosis not present

## 2019-12-20 DIAGNOSIS — S299XXA Unspecified injury of thorax, initial encounter: Secondary | ICD-10-CM | POA: Diagnosis not present

## 2019-12-20 DIAGNOSIS — M549 Dorsalgia, unspecified: Secondary | ICD-10-CM | POA: Diagnosis not present

## 2019-12-20 DIAGNOSIS — M545 Low back pain, unspecified: Secondary | ICD-10-CM

## 2019-12-20 DIAGNOSIS — G8929 Other chronic pain: Secondary | ICD-10-CM

## 2019-12-20 DIAGNOSIS — I5032 Chronic diastolic (congestive) heart failure: Secondary | ICD-10-CM

## 2019-12-20 DIAGNOSIS — M1990 Unspecified osteoarthritis, unspecified site: Secondary | ICD-10-CM | POA: Diagnosis not present

## 2019-12-20 DIAGNOSIS — R0602 Shortness of breath: Secondary | ICD-10-CM | POA: Diagnosis not present

## 2019-12-20 DIAGNOSIS — M810 Age-related osteoporosis without current pathological fracture: Secondary | ICD-10-CM

## 2019-12-20 DIAGNOSIS — R609 Edema, unspecified: Secondary | ICD-10-CM

## 2019-12-20 DIAGNOSIS — E782 Mixed hyperlipidemia: Secondary | ICD-10-CM

## 2019-12-20 DIAGNOSIS — R52 Pain, unspecified: Secondary | ICD-10-CM

## 2019-12-20 MED ORDER — ONDANSETRON HCL 4 MG PO TABS: 4.0000 mg | ORAL_TABLET | Freq: Three times a day (TID) | ORAL | 0 refills | Status: DC | PRN

## 2019-12-20 NOTE — Telephone Encounter (Signed)
[name] (DPR signed) left v/m that pt fell on 12/19/19 when her back gave out on her. Wants pt to be seen and request cb to her sister [name] (DPR signed). I called [name] and pt fell trying to maneuver around a pillow in the floor at home on 12/19/19; pt had 2nd Covid vaccine on 12/17/2019; developed back pain on 12/18/19 and fell on 12/19/19. Pt is having spasms all over her back; pts back pain is worse when tries to walk. Pain level now is 7. [name] cannot remember when procedure done but pt has cement in between vertebrae.Pain does not radiate down legs but does have pain in lower abdomen and both hips. Pt has a grimace look on face while she is sitting like she is in pain.  Pt has felt nauseated this morning and [name] gave pt Disintegrating Ondansetron which has helped the nausea;pt was not prescribed ondansetron; this was a med of [place]. Pt does not have any covid symptoms, no travel or known exposure to covid. Pt does have to have assistance when she tries to walk to bathroom and it is hard for pt to get up out of bed since the fall. Pt does not want to go to ED or UC. Dr Reece Agar said he would see pt today at 12:30. [name] voiced understanding.

## 2019-12-20 NOTE — Telephone Encounter (Signed)
I discussed this with patient today, advised don't start zetia until current body aches are improved.

## 2019-12-20 NOTE — Progress Notes (Signed)
This visit was conducted in person.  BP 124/66 (BP Location: Right Arm, Patient Position: Sitting, Cuff Size: Large)   Pulse 100   Temp 97.7 F (36.5 C) (Temporal)   Ht 4\' 11"  (1.499 m)   SpO2 94% Comment: RA  BMI 37.57 kg/m   Difficulty recording O2 sat by pulse ox - finally get good reading after warming fingers.   CC: fall with back pain Subjective:    Patient ID: [name] [name], female    DOB: 12/23/1945, 74 y.o.   MRN: [id number]  HPI: [name] [name] is a 74 y.o. female presenting on 12/20/2019 for Back Pain (C/o spasms all over back pain.  Started on 12/18/19.  Woke having muscle spasms.  Then pt fell yesterday.  Pt accompanied by daughter, [name]- temp 97.7.)   2nd Pfizer COVID vaccine 12/17/2019. Woke up 3/24 with diffuse spasms and body aches. Suffered fall 3/25 when trying to navigate past a pillow on the floor. No obvious injury noted at that time. She may have hit head, but denies headaches or vision changes. She had some nausea - treated with zofran with benefit - requests Rx for this.  Diffuse body spasms persist, having lower lower back ache. Chronic dyspnea. They are not following lasix instructions as per cardiology. Weight 186 lbs yesterday, did not weight this morning. They've only been taking lasix 20mg  1/2 tab once daily.   Managing discomfort with tramadol - up to 2 at a time. Took 2 this morning. This could be contributing to nausea as normally takes sparingly PRN  No fevers/chills, abd pain, vomiting, diarrhea or constipation. Denies significant cough or chest pain. No dysuria or urgency or trouble making urine.  HLD - planning to start zetia - advised don't start until current body pains are fully resolved.     Relevant past medical, surgical, family and social history reviewed and updated as indicated. Interim medical history since our last visit reviewed. Allergies and medications reviewed and updated. Outpatient Medications Prior to Visit    Medication Sig Dispense Refill  . acetaminophen (TYLENOL) 500 MG tablet Take 1 tablet (500 mg total) by mouth every 6 (six) hours as needed for headache.    . albuterol (PROVENTIL HFA;VENTOLIN HFA) 108 (90 Base) MCG/ACT inhaler Inhale 2 puffs into the lungs every 6 (six) hours as needed for wheezing or shortness of breath. 1 Inhaler 2  . aspirin (ASPIRIN 81) 81 MG EC tablet Take 1 tablet (81 mg total) by mouth daily. Swallow whole.    4/25 atorvastatin (LIPITOR) 40 MG tablet TAKE 1 TABLET EVERY DAY 90 tablet 1  . benazepril (LOTENSIN) 5 MG tablet Take 1 tablet (5 mg total) by mouth daily. 30 tablet 3  . CALCIUM-VITAMIN D PO Take 1 tablet by mouth daily.    . Cholecalciferol (VITAMIN D3) 125 MCG (5000 UT) CAPS Take by mouth daily.    . Cholecalciferol (VITAMIN D3) 25 MCG (1000 UT) CAPS Take 1,000 Units by mouth daily.    . clopidogrel (PLAVIX) 75 MG tablet TAKE 1 TABLET (75 MG TOTAL) BY MOUTH DAILY. 90 tablet 1  . Cranberry-Vitamin C (AZO CRANBERRY URINARY TRACT PO) Take by mouth in the morning and at bedtime.    ezetimibe (ZETIA) 10 MG tablet Take 1 tablet (10 mg total) by mouth daily. 90 tablet 3  . fluticasone-salmeterol (ADVAIR HFA) 115-21 MCG/ACT inhaler Inhale 2 puffs into the lungs 2 (two) times daily. Rinse mouth after use. 1 Inhaler 12  . furosemide (LASIX) 40  MG tablet Take 1 tablet (40 mg total) by mouth daily.    [name] gabapentin (NEURONTIN) 100 MG capsule Take 1 capsule (100 mg total) by mouth at bedtime. 90 capsule 1  . Homeopathic Products (ZINC COLD THERAPY PO) Take by mouth daily.    [name] levothyroxine (SYNTHROID) 50 MCG tablet TAKE 1 TABLET  DAILY BEFORE BREAKFAST. 90 tablet 1  . Multiple Vitamins-Minerals (MULTIVITAMIN PO) Take 1 tablet by mouth daily.    . OXYGEN Inhale 2 L into the lungs at bedtime.    . pantoprazole (PROTONIX) 40 MG tablet Take 1 tablet (40 mg total) by mouth daily. For 2 weeks then as needed 90 tablet 2  . Potassium Chloride ER 20 MEQ TBCR TAKE 2 TABLETS BY MOUTH  AS NEEDED (WHENEVER  YOU  TAKE  LASIX) 180 tablet 2  . sertraline (ZOLOFT) 100 MG tablet TAKE 1 TABLET EVERY DAY 90 tablet 1  . traMADol (ULTRAM) 50 MG tablet TAKE 1 TO 2 TABLETS BY MOUTH THREE TIMES DAILY AS NEEDED FOR MODERATE PAIN 30 tablet 0  . traZODone (DESYREL) 100 MG tablet Take 1.5 tablets (150 mg total) by mouth at bedtime. 135 tablet 1  . trimethoprim (TRIMPEX) 100 MG tablet Take 1 tablet (100 mg total) by mouth daily. Take daily to prevent UTI 90 tablet 1   No facility-administered medications prior to visit.     Per HPI unless specifically indicated in ROS section below Review of Systems Objective:    BP 124/66 (BP Location: Right Arm, Patient Position: Sitting, Cuff Size: Large)   Pulse 100   Temp 97.7 F (36.5 C) (Temporal)   Ht 4\' 11"  (1.499 m)   SpO2 94% Comment: RA  BMI 37.57 kg/m   Wt Readings from Last 3 Encounters:  11/25/19 186 lb (84.4 kg)  11/05/19 184 lb 7 oz (83.7 kg)  11/05/19 181 lb (82.1 kg)    Physical Exam Vitals and nursing note reviewed.  Constitutional:      Appearance: Normal appearance. She is obese. She is not ill-appearing.     Comments: Uncomfortable in wheelchair - any movement causes generalized body aches.   Cardiovascular:     Rate and Rhythm: Normal rate and regular rhythm.     Pulses: Normal pulses.     Heart sounds: Normal heart sounds. No murmur.  Pulmonary:     Effort: Pulmonary effort is normal. No respiratory distress.     Breath sounds: Normal breath sounds. No wheezing, rhonchi or rales.  Musculoskeletal:     Right lower leg: Edema (1+) present.     Left lower leg: Edema (1+) present.     Comments: She does have midline lower thoracic spine tenderness into lumbar region as well as paraspinous mm discomfort to palpation. No upper thoracic or cervical spine tenderness to palpation  Skin:    Findings: No rash.  Neurological:     Mental Status: She is alert.       Results for orders placed or performed in visit on  11/18/19  Renal function panel  Result Value Ref Range   Sodium 138 135 - 145 mEq/L   Potassium 4.5 3.5 - 5.1 mEq/L   Chloride 101 96 - 112 mEq/L   CO2 28 19 - 32 mEq/L   Albumin 4.3 3.5 - 5.2 g/dL   BUN 23 6 - 23 mg/dL   Creatinine, Ser 11/20/19 0.40 - 1.20 mg/dL   Glucose, Bld 93 70 - 99 mg/dL   Phosphorus 3.3 2.3 - 4.6 mg/dL  GFR 50.79 (L) >60.00 mL/min   Calcium 9.5 8.4 - 10.5 mg/dL   DG Thoracic Spine W/Swimmers CLINICAL DATA:  Larey Seat, back pain  EXAM: THORACIC SPINE - 3 VIEWS  COMPARISON:  01/24/2019  FINDINGS: Frontal and lateral views of the thoracic spine are obtained. Chronic T11 compression deformity again noted. No acute bony abnormalities. Alignment is stable. Paraspinal soft tissues are unremarkable. Atherosclerosis of the thoracic aorta unchanged.  IMPRESSION: 1. Stable T11 compression deformity.  No acute bony abnormality.  Electronically Signed   By: Sharlet Salina M.D.   On: 12/20/2019 16:59 DG Lumbar Spine Complete CLINICAL DATA:  Larey Seat, back pain  EXAM: LUMBAR SPINE - COMPLETE 4+ VIEW  COMPARISON:  10/30/2017  FINDINGS: Frontal, bilateral oblique, lateral views of the lumbar spine are obtained. There are 5 non-rib-bearing lumbar type vertebral bodies in stable alignment. Prior L4 compression deformity and vertebral augmentation. Chronic T11 and L1 compression deformities. No new fractures. Mild spondylosis at the lumbosacral junction. There is mild diffuse facet hypertrophy. Extensive atherosclerosis of the aorta again noted. Visualized portions of the bony pelvis are unremarkable.  IMPRESSION: 1. Chronic compression deformities of T11, L1, and L4. 2. No acute bony abnormality. 3. Stable lower lumbar spondylosis and facet hypertrophy.  Electronically Signed   By: Sharlet Salina M.D.   On: 12/20/2019 16:58   Assessment & Plan:  This visit occurred during the SARS-CoV-2 public health emergency.  Safety protocols were in place, including  screening questions prior to the visit, additional usage of staff PPE, and extensive cleaning of exam room while observing appropriate contact time as indicated for disinfecting solutions.   Problem List Items Addressed This Visit    Shortness of breath    Chronic, stable.       Recurrent falls    Recurrent mechanical fall at home. She regularly uses walker at home.       Peripheral edema    1+ pitting present today presumed CHF related. Again, reviewed diuretic regimen.       Osteoporosis    H/o compression fractures.  Continues fosamax weekly since 2013 - as prolia was unaffordable.       Osteoarthritis   Hyperlipidemia    See recent phone note- planning to start zetia in addition to statin as LDL is not at goal  Advised start this AFTER current acute illness.       Chronic respiratory failure with hypoxia (HCC)    Uses oxygen only PRN at home. O2 sat 94% on RA today at rest in wheelchair.       Chronic diastolic heart failure (HCC)    Emphasized diuretic plan per cardiology (see below)      Chronic back pain   Acute midline back pain - Primary    Acute midline back pain predominantly at lumbar region on exam, but also with significant body aches throughout - in h/o osteoporosis and prior compression fractures, will update spine films. Diffuse body aches without other localizing symptoms seemed to start after covid vaccine - possible side effect. If that's the case, should expect to see marked improvement over the next few days. Advised update [place] if ongoing symptoms.  I also recommended starting pain control with scheduled tylenol before going to tramadol.       Relevant Orders   DG Lumbar Spine Complete (Completed)   DG Thoracic Spine W/Swimmers (Completed)    Other Visit Diagnoses    Generalized body aches           Meds  ordered this encounter  Medications  . ondansetron (ZOFRAN) 4 MG tablet    Sig: Take 1 tablet (4 mg total) by mouth every 8 (eight) hours as  needed for nausea or vomiting.    Dispense:  20 tablet    Refill:  0   Orders Placed This Encounter  Procedures  . DG Lumbar Spine Complete    Standing Status:   Future    Number of Occurrences:   1    Standing Expiration Date:   02/18/2021    Order Specific Question:   Reason for Exam (SYMPTOM  OR DIAGNOSIS REQUIRED)    Answer:   fall with back pain    Order Specific Question:   Preferred imaging location?    Answer:   [facility]    Order Specific Question:   Radiology Contrast Protocol - do NOT remove file path    Answer:   \\[email or web address]  . DG Thoracic Spine W/Swimmers    Standing Status:   Future    Number of Occurrences:   1    Standing Expiration Date:   02/18/2021    Order Specific Question:   Reason for Exam (SYMPTOM  OR DIAGNOSIS REQUIRED)    Answer:   fall with back pain    Order Specific Question:   Preferred imaging location?    Answer:   [facility]    Order Specific Question:   Radiology Contrast Protocol - do NOT remove file path    Answer:   \\[email or web address]    Patient Instructions  I think body spasms may be side effect to your second recent covid vaccine. This should improve over the next few days.  Take tylenol 500-1000mg  up to three times daily for pain - start with this. If breakthrough pain despite tylenol, then take tramadol. zofran nausea medicine refilled.  We will be in touch with xray results.   Follow below instructions for lasix dosing:  Lasix 20 mg daily for weight 178 to 183  Weight >183, lasix 20 twice a day x 1 day until weight drops  Weight <177, no lasix   Follow up plan: Return if symptoms worsen or fail to improve.  Ria Bush, MD

## 2019-12-20 NOTE — Telephone Encounter (Signed)
Will see today.  

## 2019-12-20 NOTE — Patient Instructions (Addendum)
I think body spasms may be side effect to your second recent covid vaccine. This should improve over the next few days.  Take tylenol 500-1000mg  up to three times daily for pain - start with this. If breakthrough pain despite tylenol, then take tramadol. zofran nausea medicine refilled.  We will be in touch with xray results.   Follow below instructions for lasix dosing:  Lasix 20 mg daily for weight 178 to 183  Weight >183, lasix 20 twice a day x 1 day until weight drops  Weight <177, no lasix

## 2019-12-21 ENCOUNTER — Telehealth: Payer: Self-pay | Admitting: Family Medicine

## 2019-12-21 DIAGNOSIS — M549 Dorsalgia, unspecified: Secondary | ICD-10-CM | POA: Insufficient documentation

## 2019-12-21 NOTE — Assessment & Plan Note (Signed)
Recurrent mechanical fall at home. She regularly uses walker at home.

## 2019-12-21 NOTE — Assessment & Plan Note (Signed)
See recent phone note- planning to start zetia in addition to statin as LDL is not at goal  Advised start this AFTER current acute illness.

## 2019-12-21 NOTE — Telephone Encounter (Signed)
I'm not sure they check MyCH[employer]. plz notify daughter:  Your lower back xray returned reassuringly ok  There were chronic compression fractures at T11, L1 and L4.  No new fractures were seen.  Thoracic spine films were also stable.

## 2019-12-21 NOTE — Assessment & Plan Note (Signed)
Chronic, stable 

## 2019-12-21 NOTE — Assessment & Plan Note (Signed)
Emphasized diuretic plan per cardiology (see below)

## 2019-12-21 NOTE — Assessment & Plan Note (Addendum)
Acute midline back pain predominantly at lumbar region on exam, but also with significant body aches throughout - in h/o osteoporosis and prior compression fractures, will update spine films. Diffuse body aches without other localizing symptoms seemed to start after covid vaccine - possible side effect. If that's the case, should expect to see marked improvement over the next few days. Advised update [place] if ongoing symptoms.  I also recommended starting pain control with scheduled tylenol before going to tramadol.

## 2019-12-21 NOTE — Assessment & Plan Note (Signed)
Uses oxygen only PRN at home. O2 sat 94% on RA today at rest in wheelchair.

## 2019-12-21 NOTE — Assessment & Plan Note (Signed)
1+ pitting present today presumed CHF related. Again, reviewed diuretic regimen.

## 2019-12-21 NOTE — Assessment & Plan Note (Addendum)
H/o compression fractures.  Continues fosamax weekly since 2013 - as prolia was unaffordable.

## 2019-12-22 ENCOUNTER — Encounter (HOSPITAL_COMMUNITY): Payer: Self-pay | Admitting: Radiology

## 2019-12-22 ENCOUNTER — Emergency Department (HOSPITAL_COMMUNITY): Payer: Medicare HMO

## 2019-12-22 ENCOUNTER — Other Ambulatory Visit: Payer: Self-pay

## 2019-12-22 ENCOUNTER — Inpatient Hospital Stay (HOSPITAL_COMMUNITY)
Admission: EM | Admit: 2019-12-22 | Discharge: 2019-12-26 | DRG: 315 | Disposition: A | Discharge status: Home Health | Payer: Medicare HMO | Attending: Internal Medicine | Admitting: Internal Medicine

## 2019-12-22 DIAGNOSIS — J9611 Chronic respiratory failure with hypoxia: Secondary | ICD-10-CM | POA: Diagnosis not present

## 2019-12-22 DIAGNOSIS — M546 Pain in thoracic spine: Secondary | ICD-10-CM | POA: Diagnosis not present

## 2019-12-22 DIAGNOSIS — I959 Hypotension, unspecified: Secondary | ICD-10-CM | POA: Diagnosis not present

## 2019-12-22 DIAGNOSIS — Z79899 Other long term (current) drug therapy: Secondary | ICD-10-CM

## 2019-12-22 DIAGNOSIS — R778 Other specified abnormalities of plasma proteins: Secondary | ICD-10-CM | POA: Diagnosis not present

## 2019-12-22 DIAGNOSIS — R531 Weakness: Secondary | ICD-10-CM | POA: Diagnosis not present

## 2019-12-22 DIAGNOSIS — Z6833 Body mass index (BMI) 33.0-33.9, adult: Secondary | ICD-10-CM

## 2019-12-22 DIAGNOSIS — M4854XD Collapsed vertebra, not elsewhere classified, thoracic region, subsequent encounter for fracture with routine healing: Secondary | ICD-10-CM | POA: Diagnosis present

## 2019-12-22 DIAGNOSIS — G4700 Insomnia, unspecified: Secondary | ICD-10-CM | POA: Diagnosis present

## 2019-12-22 DIAGNOSIS — I10 Essential (primary) hypertension: Secondary | ICD-10-CM | POA: Diagnosis present

## 2019-12-22 DIAGNOSIS — N1831 Chronic kidney disease, stage 3a: Secondary | ICD-10-CM | POA: Diagnosis present

## 2019-12-22 DIAGNOSIS — D631 Anemia in chronic kidney disease: Secondary | ICD-10-CM | POA: Diagnosis present

## 2019-12-22 DIAGNOSIS — E782 Mixed hyperlipidemia: Secondary | ICD-10-CM

## 2019-12-22 DIAGNOSIS — J432 Centrilobular emphysema: Secondary | ICD-10-CM | POA: Diagnosis not present

## 2019-12-22 DIAGNOSIS — I130 Hypertensive heart and chronic kidney disease with heart failure and stage 1 through stage 4 chronic kidney disease, or unspecified chronic kidney disease: Secondary | ICD-10-CM | POA: Diagnosis present

## 2019-12-22 DIAGNOSIS — I6521 Occlusion and stenosis of right carotid artery: Secondary | ICD-10-CM | POA: Diagnosis present

## 2019-12-22 DIAGNOSIS — R0602 Shortness of breath: Secondary | ICD-10-CM | POA: Diagnosis present

## 2019-12-22 DIAGNOSIS — Z8049 Family history of malignant neoplasm of other genital organs: Secondary | ICD-10-CM

## 2019-12-22 DIAGNOSIS — I429 Cardiomyopathy, unspecified: Secondary | ICD-10-CM | POA: Diagnosis not present

## 2019-12-22 DIAGNOSIS — J449 Chronic obstructive pulmonary disease, unspecified: Secondary | ICD-10-CM | POA: Diagnosis present

## 2019-12-22 DIAGNOSIS — Z9049 Acquired absence of other specified parts of digestive tract: Secondary | ICD-10-CM

## 2019-12-22 DIAGNOSIS — N179 Acute kidney failure, unspecified: Secondary | ICD-10-CM | POA: Diagnosis not present

## 2019-12-22 DIAGNOSIS — E559 Vitamin D deficiency, unspecified: Secondary | ICD-10-CM | POA: Diagnosis present

## 2019-12-22 DIAGNOSIS — I2510 Atherosclerotic heart disease of native coronary artery without angina pectoris: Secondary | ICD-10-CM | POA: Diagnosis present

## 2019-12-22 DIAGNOSIS — Z8249 Family history of ischemic heart disease and other diseases of the circulatory system: Secondary | ICD-10-CM

## 2019-12-22 DIAGNOSIS — M549 Dorsalgia, unspecified: Secondary | ICD-10-CM | POA: Diagnosis not present

## 2019-12-22 DIAGNOSIS — R8281 Pyuria: Secondary | ICD-10-CM | POA: Diagnosis present

## 2019-12-22 DIAGNOSIS — Z9981 Dependence on supplemental oxygen: Secondary | ICD-10-CM

## 2019-12-22 DIAGNOSIS — F329 Major depressive disorder, single episode, unspecified: Secondary | ICD-10-CM | POA: Diagnosis present

## 2019-12-22 DIAGNOSIS — E039 Hypothyroidism, unspecified: Secondary | ICD-10-CM | POA: Diagnosis present

## 2019-12-22 DIAGNOSIS — Z79890 Hormone replacement therapy (postmenopausal): Secondary | ICD-10-CM

## 2019-12-22 DIAGNOSIS — Z20822 Contact with and (suspected) exposure to covid-19: Secondary | ICD-10-CM | POA: Diagnosis not present

## 2019-12-22 DIAGNOSIS — I69351 Hemiplegia and hemiparesis following cerebral infarction affecting right dominant side: Secondary | ICD-10-CM

## 2019-12-22 DIAGNOSIS — Z7951 Long term (current) use of inhaled steroids: Secondary | ICD-10-CM

## 2019-12-22 DIAGNOSIS — M4856XD Collapsed vertebra, not elsewhere classified, lumbar region, subsequent encounter for fracture with routine healing: Secondary | ICD-10-CM | POA: Diagnosis present

## 2019-12-22 DIAGNOSIS — I350 Nonrheumatic aortic (valve) stenosis: Secondary | ICD-10-CM | POA: Diagnosis not present

## 2019-12-22 DIAGNOSIS — Z9861 Coronary angioplasty status: Secondary | ICD-10-CM

## 2019-12-22 DIAGNOSIS — I514 Myocarditis, unspecified: Secondary | ICD-10-CM | POA: Diagnosis present

## 2019-12-22 DIAGNOSIS — G629 Polyneuropathy, unspecified: Secondary | ICD-10-CM | POA: Diagnosis present

## 2019-12-22 DIAGNOSIS — Z8241 Family history of sudden cardiac death: Secondary | ICD-10-CM

## 2019-12-22 DIAGNOSIS — I351 Nonrheumatic aortic (valve) insufficiency: Secondary | ICD-10-CM | POA: Diagnosis not present

## 2019-12-22 DIAGNOSIS — Z881 Allergy status to other antibiotic agents status: Secondary | ICD-10-CM

## 2019-12-22 DIAGNOSIS — R000 Tachycardia, unspecified: Secondary | ICD-10-CM | POA: Diagnosis not present

## 2019-12-22 DIAGNOSIS — I739 Peripheral vascular disease, unspecified: Secondary | ICD-10-CM | POA: Diagnosis not present

## 2019-12-22 DIAGNOSIS — Z9842 Cataract extraction status, left eye: Secondary | ICD-10-CM

## 2019-12-22 DIAGNOSIS — F419 Anxiety disorder, unspecified: Secondary | ICD-10-CM | POA: Diagnosis present

## 2019-12-22 DIAGNOSIS — I409 Acute myocarditis, unspecified: Secondary | ICD-10-CM | POA: Diagnosis not present

## 2019-12-22 DIAGNOSIS — E785 Hyperlipidemia, unspecified: Secondary | ICD-10-CM | POA: Diagnosis present

## 2019-12-22 DIAGNOSIS — Z888 Allergy status to other drugs, medicaments and biological substances status: Secondary | ICD-10-CM

## 2019-12-22 DIAGNOSIS — Z9841 Cataract extraction status, right eye: Secondary | ICD-10-CM

## 2019-12-22 DIAGNOSIS — Z7902 Long term (current) use of antithrombotics/antiplatelets: Secondary | ICD-10-CM

## 2019-12-22 DIAGNOSIS — R05 Cough: Secondary | ICD-10-CM | POA: Diagnosis not present

## 2019-12-22 DIAGNOSIS — R9431 Abnormal electrocardiogram [ECG] [EKG]: Secondary | ICD-10-CM | POA: Diagnosis not present

## 2019-12-22 DIAGNOSIS — I5032 Chronic diastolic (congestive) heart failure: Secondary | ICD-10-CM | POA: Diagnosis present

## 2019-12-22 DIAGNOSIS — M545 Low back pain: Secondary | ICD-10-CM | POA: Diagnosis not present

## 2019-12-22 DIAGNOSIS — M5126 Other intervertebral disc displacement, lumbar region: Secondary | ICD-10-CM | POA: Diagnosis not present

## 2019-12-22 DIAGNOSIS — R079 Chest pain, unspecified: Secondary | ICD-10-CM | POA: Diagnosis not present

## 2019-12-22 DIAGNOSIS — M1990 Unspecified osteoarthritis, unspecified site: Secondary | ICD-10-CM | POA: Diagnosis present

## 2019-12-22 DIAGNOSIS — Z87891 Personal history of nicotine dependence: Secondary | ICD-10-CM

## 2019-12-22 DIAGNOSIS — Z8379 Family history of other diseases of the digestive system: Secondary | ICD-10-CM

## 2019-12-22 DIAGNOSIS — Z833 Family history of diabetes mellitus: Secondary | ICD-10-CM

## 2019-12-22 DIAGNOSIS — R5381 Other malaise: Secondary | ICD-10-CM

## 2019-12-22 DIAGNOSIS — R7989 Other specified abnormal findings of blood chemistry: Secondary | ICD-10-CM

## 2019-12-22 DIAGNOSIS — R4182 Altered mental status, unspecified: Secondary | ICD-10-CM | POA: Diagnosis not present

## 2019-12-22 DIAGNOSIS — R1084 Generalized abdominal pain: Secondary | ICD-10-CM | POA: Diagnosis not present

## 2019-12-22 DIAGNOSIS — Z9181 History of falling: Secondary | ICD-10-CM

## 2019-12-22 DIAGNOSIS — Z7982 Long term (current) use of aspirin: Secondary | ICD-10-CM

## 2019-12-22 DIAGNOSIS — R0902 Hypoxemia: Secondary | ICD-10-CM | POA: Diagnosis not present

## 2019-12-22 LAB — CBC WITH DIFFERENTIAL/PLATELET
Abs Immature Granulocytes: 0.05 10*3/uL (ref 0.00–0.07)
Basophils Absolute: 0.1 10*3/uL (ref 0.0–0.1)
Basophils Relative: 1 %
Eosinophils Absolute: 0.1 10*3/uL (ref 0.0–0.5)
Eosinophils Relative: 1 %
HCT: 39.4 % (ref 36.0–46.0)
Hemoglobin: 12.1 g/dL (ref 12.0–15.0)
Immature Granulocytes: 1 %
Lymphocytes Relative: 9 %
Lymphs Abs: 0.9 10*3/uL (ref 0.7–4.0)
MCH: 29.7 pg (ref 26.0–34.0)
MCHC: 30.7 g/dL (ref 30.0–36.0)
MCV: 96.8 fL (ref 80.0–100.0)
Monocytes Absolute: 0.9 10*3/uL (ref 0.1–1.0)
Monocytes Relative: 9 %
Neutro Abs: 7.5 10*3/uL (ref 1.7–7.7)
Neutrophils Relative %: 79 %
Platelets: 154 10*3/uL (ref 150–400)
RBC: 4.07 MIL/uL (ref 3.87–5.11)
RDW: 13.2 % (ref 11.5–15.5)
WBC: 9.3 10*3/uL (ref 4.0–10.5)
nRBC: 0 % (ref 0.0–0.2)

## 2019-12-22 LAB — D-DIMER, QUANTITATIVE: D-Dimer, Quant: 3.52 ug/mL-FEU — ABNORMAL HIGH (ref 0.00–0.50)

## 2019-12-22 LAB — COMPREHENSIVE METABOLIC PANEL
ALT: 20 U/L (ref 0–44)
AST: 33 U/L (ref 15–41)
Albumin: 3.6 g/dL (ref 3.5–5.0)
Alkaline Phosphatase: 92 U/L (ref 38–126)
Anion gap: 11 (ref 5–15)
BUN: 21 mg/dL (ref 8–23)
CO2: 28 mmol/L (ref 22–32)
Calcium: 9.4 mg/dL (ref 8.9–10.3)
Chloride: 97 mmol/L — ABNORMAL LOW (ref 98–111)
Creatinine, Ser: 1.55 mg/dL — ABNORMAL HIGH (ref 0.44–1.00)
GFR calc Af Amer: 38 mL/min — ABNORMAL LOW (ref >60)
GFR calc non Af Amer: 33 mL/min — ABNORMAL LOW (ref >60)
Glucose, Bld: 119 mg/dL — ABNORMAL HIGH (ref 70–99)
Potassium: 4.7 mmol/L (ref 3.5–5.1)
Sodium: 136 mmol/L (ref 135–145)
Total Bilirubin: 0.6 mg/dL (ref 0.3–1.2)
Total Protein: 7.2 g/dL (ref 6.5–8.1)

## 2019-12-22 LAB — URINALYSIS, ROUTINE W REFLEX MICROSCOPIC
Bilirubin Urine: NEGATIVE
Glucose, UA: NEGATIVE mg/dL
Hgb urine dipstick: NEGATIVE
Ketones, ur: NEGATIVE mg/dL
Nitrite: NEGATIVE
Protein, ur: NEGATIVE mg/dL
Specific Gravity, Urine: 1.013 (ref 1.005–1.030)
pH: 5 (ref 5.0–8.0)

## 2019-12-22 LAB — TROPONIN I (HIGH SENSITIVITY)
Troponin I (High Sensitivity): 403 ng/L (ref <18)
Troponin I (High Sensitivity): 392 ng/L (ref <18)

## 2019-12-22 LAB — BRAIN NATRIURETIC PEPTIDE: B Natriuretic Peptide: 136 pg/mL — ABNORMAL HIGH (ref 0.0–100.0)

## 2019-12-22 LAB — LIPASE, BLOOD: Lipase: 31 U/L (ref 11–51)

## 2019-12-22 LAB — TSH: TSH: 2.66 u[IU]/mL (ref 0.350–4.500)

## 2019-12-22 LAB — CBG MONITORING, ED: Glucose-Capillary: 98 mg/dL (ref 70–99)

## 2019-12-22 LAB — SARS CORONAVIRUS 2 (TAT 6-24 HRS): SARS Coronavirus 2: NEGATIVE

## 2019-12-22 LAB — CK: Total CK: 75 U/L (ref 38–234)

## 2019-12-22 MED ORDER — ACETAMINOPHEN 650 MG RE SUPP: 650.0000 mg | Freq: Four times a day (QID) | RECTAL | Status: DC | PRN

## 2019-12-22 MED ORDER — ONDANSETRON HCL 4 MG/2ML IJ SOLN: 4.0000 mg | Freq: Four times a day (QID) | INTRAMUSCULAR | Status: DC | PRN

## 2019-12-22 MED ORDER — MORPHINE SULFATE (PF) 4 MG/ML IV SOLN
4.0000 mg | Freq: Once | INTRAVENOUS | Status: AC
  Administered 2019-12-22: 4 mg via INTRAVENOUS
  Filled 2019-12-22: qty 1

## 2019-12-22 MED ORDER — SODIUM CHLORIDE 0.9 % IV SOLN: INTRAVENOUS | Status: DC

## 2019-12-22 MED ORDER — IOHEXOL 350 MG/ML SOLN
50.0000 mL | Freq: Once | INTRAVENOUS | Status: AC | PRN
  Administered 2019-12-22: 50 mL via INTRAVENOUS

## 2019-12-22 MED ORDER — SODIUM CHLORIDE 0.9 % IV BOLUS
500.0000 mL | Freq: Once | INTRAVENOUS | Status: AC
  Administered 2019-12-22: 500 mL via INTRAVENOUS

## 2019-12-22 MED ORDER — HEPARIN BOLUS VIA INFUSION
4200.0000 [IU] | Freq: Once | INTRAVENOUS | Status: AC
  Administered 2019-12-22: 4200 [IU] via INTRAVENOUS
  Filled 2019-12-22: qty 4200

## 2019-12-22 MED ORDER — ACETAMINOPHEN 325 MG PO TABS
650.0000 mg | ORAL_TABLET | Freq: Four times a day (QID) | ORAL | Status: DC | PRN
  Administered 2019-12-22 – 2019-12-25 (×4): 650 mg via ORAL
  Filled 2019-12-22 (×4): qty 2

## 2019-12-22 MED ORDER — SODIUM CHLORIDE 0.9 % IV SOLN
1.0000 g | Freq: Once | INTRAVENOUS | Status: AC
  Administered 2019-12-22: 1 g via INTRAVENOUS
  Filled 2019-12-22: qty 10

## 2019-12-22 MED ORDER — ASPIRIN 81 MG PO CHEW
324.0000 mg | CHEWABLE_TABLET | Freq: Once | ORAL | Status: AC
  Administered 2019-12-22: 324 mg via ORAL
  Filled 2019-12-22: qty 4

## 2019-12-22 MED ORDER — HEPARIN (PORCINE) 25000 UT/250ML-% IV SOLN
750.0000 [IU]/h | INTRAVENOUS | Status: DC
  Administered 2019-12-22: 1050 [IU]/h via INTRAVENOUS
  Administered 2019-12-23: 850 [IU]/h via INTRAVENOUS
  Filled 2019-12-22 (×2): qty 250

## 2019-12-22 MED ORDER — ONDANSETRON HCL 4 MG PO TABS: 4.0000 mg | ORAL_TABLET | Freq: Four times a day (QID) | ORAL | Status: DC | PRN

## 2019-12-22 NOTE — Progress Notes (Signed)
ANTICOAGULATION CONSULT NOTE  Pharmacy Consult for Heparin Indication: pulmonary embolus  Allergies  Allergen Reactions  . Doxycycline Other (See Comments)    Sig GI upset    Patient Measurements: Height: 4\' 11"  (149.9 cm) Weight: 165 lb (74.8 kg) IBW/kg (Calculated) : 43.2 Heparin Dosing Weight: 60.3 kg  Vital Signs: Temp: 99.8 F (37.7 C) (03/28 1325) Temp Source: Rectal (03/28 1325) BP: 114/51 (03/28 1500) Pulse Rate: 90 (03/28 1500)  Labs: Recent Labs    12/22/19 1243 12/22/19 1529  HGB 12.1  --   HCT 39.4  --   PLT 154  --   CREATININE 1.55*  --   CKTOTAL 75  --   TROPONINIHS 403* 392*    Estimated Creatinine Clearance: 28.5 mL/min (A) (by C-G formula based on SCr of 1.55 mg/dL (H)).   Medical History: Past Medical History:  Diagnosis Date  . Aneurysm (HCC)   . Anxiety   . Anxiety and depression   . Carotid stenosis    R 50% (12/2012)  . Concussion 08/03/2015  . COPD (chronic obstructive pulmonary disease) (HCC) 12/2012   spirometry: Pre: FVC 84%, FEV1 69%, ratio 0.64 consistent with moderate obstruction.  . Depression   . Fall 08/03/2015   d/c home health 08/2015  . Fracture of cervical vertebra, C5 (HCC) 08/06/2015  . History of chicken pox   . Hyperlipidemia   . Hypertension   . Lower back pain    h/o HNP s/p surgery  . Osteoarthritis    h/o ruptured disc s/p ESI  . Osteoporosis 11/2010   DEXA -2.7 spine, thoracic compression fracture  . Peripheral vascular disease (HCC)   . Smoker    quit 10/2012  . Stroke [facility]) 2010   x3 with residual R hemiparesis, s/p R MCA balloon angioplasty (2010)    Medications:  Scheduled:  . aspirin  324 mg Oral Once  . heparin  4,200 Units Intravenous Once    Assessment: Patient is a 63 yof that presents to the ED with c/o weakness. The patient has a slight Trop bump and an elevated D.Dimer. Pharmacy has been asked to dose heparin at this time for PE.   Goal of Therapy:  Heparin level 0.3-0.7  units/ml Monitor platelets by anticoagulation protocol: Yes   Plan:  - Heparin bolus 4200 units IV x 1 dose - Heparin drip @ 1050 units/hr - Heparin level in ~ 6 hours  - Monitor patient for s/s of bleeding and CBC while on heparin   65 PharmD. BCPS  12/22/2019,4:45 PM

## 2019-12-22 NOTE — ED Notes (Signed)
Pt in CT at this time.

## 2019-12-22 NOTE — ED Notes (Signed)
Currently on Abx for UTI, starting today.

## 2019-12-22 NOTE — ED Notes (Signed)
Placed on oxygen at 2L, states uses it at HS and intermittently during the day.

## 2019-12-22 NOTE — Consult Note (Signed)
[facility] Consult Note   Primary Physician:  Eustaquio BoydenGutierrez, Javier, MD Primary Cardiologist:   Julien NordmannGollan, Timothy, MD  Reason for Consultation:  Generalized Weakness  HPI:    [name] [name] is a 74 year old female with a past medical history significant for a CVA (s/p residual right-sided weakness, on dual antiplatelet therapy with aspirin and Plavix), diastolic heart failure, COPD (on home oxygen), mild aortic stenosis, former smoking, peripheral arterial disease, hypertension, hyperlipidemia, spinal compression fractures, osteoporosis, severe deconditioning, morbid obesity, depression, frequent falls, and carotid artery disease who is admitted to the hospital for complaints of generalized fatigue and weakness and muscle spasms since receiving the second dose of the COVID-19 vaccine (12/17/2019).  She denies any fevers or chills.  On November 25, 2019, she was seen by Dr. Julien Nordmannimothy Gollan, in the cardiology clinic, for lower extremity swelling and exertional shortness of breath.  For a history of chronic diastolic congestive heart failure her diuretic dose was adjusted.  Since then the patient's lower extremity edema has actually improved significantly.  Her weight has been reasonably stable without any dramatic gains in the recent past.  However she still complains of exertional shortness of breath.  On this admission she denied any chest pain.  In the ED the blood pressure was 115/5875mmHg with a heart rate of 103 beats per minute.  The labs were as follows: BNP 136, potassium 4.7, creatinine 1.55, WBC 9.3, hematocrit 39.4 and platelets 154.  The high-sensitivity troponins were 403 and 392.  The D-dimer was elevated at 3.52.  CT angiogram of the chest was negative for pulmonary embolism.  EKG revealed normal sinus rhythm with transient mild ST changes that subsequently normalized.  Previous Cardiac testing  07/31/2018 - Echocardiogram  - Left ventricle: estimated ejection fraction was in the range  of 60%    to 65%. Wall motion was normal; there were no regional wall    motion abnormalities. Doppler parameters are consistent with    abnormal left ventricular relaxation (grade 1 diastolic    dysfunction). Indeterminate LV filling pressure.  - Aortic valve: severe  thickening; severely thickened, moderately calcified leaflets. Valve mobility was moderately restricted. There was mild stenosis. mild regurgitation. Mean gradient (S): 9 mm    Hg. Valve area (VTI): 1.5 cm^2  01/25/2019 - Myocardial perfusion scan IMPRESSION: 1. No reversible ischemia or infarction. 2. Normal left ventricular wall motion. 3. Left ventricular ejection fraction is 71%. 4. Non-invasive risk stratification: Low   Home Medications Prior to Admission medications   Medication Sig Start Date End Date Taking? Authorizing Provider  acetaminophen (TYLENOL) 500 MG tablet Take 1 tablet (500 mg total) by mouth every 6 (six) hours as needed for headache. Patient taking differently: Take 500 mg by mouth every 6 (six) hours as needed for mild pain or headache.  01/01/19  Yes Eustaquio BoydenGutierrez, Javier, MD  albuterol (PROVENTIL HFA;VENTOLIN HFA) 108 (90 Base) MCG/ACT inhaler Inhale 2 puffs into the lungs every 6 (six) hours as needed for wheezing or shortness of breath. 11/19/18  Yes Eustaquio BoydenGutierrez, Javier, MD  aspirin (ASPIRIN 81) 81 MG EC tablet Take 1 tablet (81 mg total) by mouth daily. Swallow whole. 12/15/18  Yes Eustaquio BoydenGutierrez, Javier, MD  atorvastatin (LIPITOR) 40 MG tablet TAKE 1 TABLET EVERY DAY Patient taking differently: Take 40 mg by mouth daily.  09/19/19  Yes Eustaquio BoydenGutierrez, Javier, MD  benazepril (LOTENSIN) 5 MG tablet Take 1 tablet (5 mg total) by mouth daily. 11/20/17  Yes Eustaquio BoydenGutierrez, Javier, MD  Cholecalciferol (VITAMIN D3) 125  MCG (5000 UT) CAPS Take 5,000 Units by mouth daily.    Yes [provider]  Cholecalciferol (VITAMIN D3) 25 MCG (1000 UT) CAPS Take 1,000 Units by mouth daily.   Yes [provider]  clopidogrel  (PLAVIX) 75 MG tablet TAKE 1 TABLET (75 MG TOTAL) BY MOUTH DAILY. 11/19/19  Yes [name], MD  Cranberry-Vitamin C (AZO CRANBERRY URINARY TRACT PO) Take 1 tablet by mouth in the morning and at bedtime.    Yes [provider]  furosemide (LASIX) 40 MG tablet Take 20 mg by mouth daily.  11/21/19  Yes [name], MD  gabapentin (NEURONTIN) 100 MG capsule Take 1 capsule (100 mg total) by mouth at bedtime. 06/21/19  Yes [name], MD  Homeopathic Products (ZINC COLD THERAPY PO) Take 1 tablet by mouth daily.    Yes [provider]  levothyroxine (SYNTHROID) 50 MCG tablet TAKE 1 TABLET  DAILY BEFORE BREAKFAST. Patient taking differently: Take 50 mcg by mouth daily before breakfast.  09/12/19  Yes [name], MD  Multiple Vitamins-Minerals (MULTIVITAMIN PO) Take 1 tablet by mouth daily.   Yes [provider]  ondansetron (ZOFRAN) 4 MG tablet Take 1 tablet (4 mg total) by mouth every 8 (eight) hours as needed for nausea or vomiting. 12/20/19  Yes [name], MD  OXYGEN Inhale 2 L/min into the lungs See admin instructions. Inhale 2 L/min of oxygen at bedtime and during the day as needed for shortness of breath   Yes [provider]  pantoprazole (PROTONIX) 40 MG tablet Take 1 tablet (40 mg total) by mouth daily. For 2 weeks then as needed Patient taking differently: Take 40 mg by mouth every other day.  06/21/19  Yes [name], MD  Potassium Chloride ER 20 MEQ TBCR TAKE 2 TABLETS BY MOUTH AS NEEDED (WHENEVER  YOU  TAKE  LASIX) Patient taking differently: Take 20 mEq by mouth daily.  11/07/19  Yes [name], MD  sertraline (ZOLOFT) 100 MG tablet TAKE 1 TABLET EVERY DAY Patient taking differently: Take 100 mg by mouth daily.  09/19/19  Yes [name], MD  traMADol (ULTRAM) 50 MG tablet TAKE 1 TO 2 TABLETS BY MOUTH THREE TIMES DAILY AS NEEDED FOR MODERATE PAIN Patient taking differently: Take 50-100 mg by mouth 3 (three)  times daily as needed for moderate pain.  11/29/19  Yes [name], MD  traZODone (DESYREL) 100 MG tablet Take 1.5 tablets (150 mg total) by mouth at bedtime. 11/21/19  Yes [name], MD  trimethoprim (TRIMPEX) 100 MG tablet Take 1 tablet (100 mg total) by mouth daily. Take daily to prevent UTI 11/05/19  Yes Vaillancourt, Lelon Mast, PA-C  ezetimibe (ZETIA) 10 MG tablet Take 1 tablet (10 mg total) by mouth daily. 12/19/19   [name], MD  fluticasone-salmeterol (ADVAIR HFA) [phone] MCG/ACT inhaler Inhale 2 puffs into the lungs 2 (two) times daily. Rinse mouth after use. Patient not taking: Reported on 12/22/2019 11/21/17   Shane Crutch, MD    Past Medical History: Past Medical History:  Diagnosis Date  . Aneurysm (HCC)   . Anxiety   . Anxiety and depression   . Carotid stenosis    R 50% (12/2012)  . Concussion 08/03/2015  . COPD (chronic obstructive pulmonary disease) (HCC) 12/2012   spirometry: Pre: FVC 84%, FEV1 69%, ratio 0.64 consistent with moderate obstruction.  . Depression   . Fall 08/03/2015   d/c home health 08/2015  . Fracture of cervical vertebra, C5 (HCC) 08/06/2015  . History  of chicken pox   . Hyperlipidemia   . Hypertension   . Lower back pain    h/o HNP s/p surgery  . Osteoarthritis    h/o ruptured disc s/p ESI  . Osteoporosis 11/2010   DEXA -2.7 spine, thoracic compression fracture  . Peripheral vascular disease (HCC)   . Smoker    quit 10/2012  . Stroke [facility]) 2010   x3 with residual R hemiparesis, s/p R MCA balloon angioplasty (2010)    Past Surgical History: Past Surgical History:  Procedure Laterality Date  . APPENDECTOMY  1960  . CATARACT EXTRACTION     bilateral  . CESAREAN SECTION    . CHOLECYSTECTOMY  1970  . COLONOSCOPY  2004   diverticulosis, no polyps Jarold Motto)  . COLONOSCOPY  05/2016   decreased sphincter tone, diverticulosis, no f/u recommended (Danis)  . DEXA  11/2010   T -2.7 spine, -1.9 hip  . HIP SURGERY Left 2006     fractured - screws placed  . IR ANGIO INTRA EXTRACRAN SEL COM CAROTID INNOMINATE BILAT MOD SED  08/01/2018  . IR ANGIO INTRA EXTRACRAN SEL COM CAROTID INNOMINATE BILAT MOD SED  12/10/2018  . IR ANGIO VERTEBRAL SEL SUBCLAVIAN INNOMINATE UNI R MOD SED  08/01/2018  . IR ANGIO VERTEBRAL SEL SUBCLAVIAN INNOMINATE UNI R MOD SED  12/10/2018  . IR ANGIO VERTEBRAL SEL VERTEBRAL UNI L MOD SED  12/10/2018  . IR RADIOLOGIST EVAL & MGMT  01/09/2018  . KYPHOPLASTY  10/02/2017   Procedure: LUMBAR FOUR KYPHOPLASTY;  Surgeon: Coletta Memos, MD;  Location: [facility] OR;  Service: Neurosurgery;;  . RADIOLOGY WITH ANESTHESIA N/A 11/11/2015   Procedure: RADIOLOGY WITH ANESTHESIA;  Surgeon: Julieanne Cotton, MD;  Location: [facility] OR;  Service: Radiology;  Laterality: N/A;  . RADIOLOGY WITH ANESTHESIA N/A 12/10/2018   Procedure: STENTING;  Surgeon: Julieanne Cotton, MD;  Location: [facility] OR;  Service: Radiology;  Laterality: N/A;    Family History: Family History  Problem Relation Age of Onset  . CAD Mother        MI  . Cancer Mother        cervical  . CAD Maternal Aunt        MI  . CAD Sister        MI  . Sudden death Father 69       died in his sleep, chain smoker  . Cirrhosis Father   . Hypertension Daughter   . Diabetes Maternal Aunt   . Cancer Maternal Grandmother        cervical    Social History: Social History   Socioeconomic History  . Marital status: Divorced    Spouse name: Not on file  . Number of children: Not on file  . Years of education: Not on file  . Highest education level: Not on file  Occupational History  . Not on file  Tobacco Use  . Smoking status: Former Smoker    Packs/day: 2.00    Years: 46.00    Pack years: 92.00    Types: Cigarettes    Quit date: 11/08/2012    Years since quitting: 7.1  . Smokeless tobacco: Never Used  Substance and Sexual Activity  . Alcohol use: No    Alcohol/week: 0.0 standard drinks  . Drug use: No  . Sexual activity: Not Currently  Other Topics  Concern  . Not on file  Social History Narrative   Lives with granddaughter and great grandchildren   Occupation: retired, was CNA   Edu:  8th grade   Activity: no regular activity:   Diet: some water, fruits/vegetables daily   Social Determinants of Health   Financial Resource Strain:   . Difficulty of Paying Living Expenses:   Food Insecurity:   . Worried About Charity fundraiser in the Last Year:   . Arboriculturist in the Last Year:   Transportation Needs:   . Film/video editor (Medical):   [name] Lack of Transportation (Non-Medical):   Physical Activity:   . Days of Exercise per Week:   . Minutes of Exercise per Session:   Stress:   . Feeling of Stress :   Social Connections:   . Frequency of Communication with Friends and Family:   . Frequency of Social Gatherings with Friends and Family:   . Attends Religious Services:   . Active Member of Clubs or Organizations:   . Attends Archivist Meetings:   [name] Marital Status:     Allergies:  Allergies  Allergen Reactions  . Doxycycline Nausea Only and Other (See Comments)    Sig GI upset  . Fluticasone-Salmeterol Anxiety     Review of Systems: [y] = yes, [ ]  = no   . General: Weight gain [ ] ; Weight loss [ ] ; Anorexia [ ] ; Fatigue [Y]; Fever [ ] ; Chills [ ] ; Weakness [Y]  . Cardiac: Chest pain/pressure [ ] ; Resting SOB [ ] ; Exertional SOB [Y]; Orthopnea [ ] ; Pedal Edema [Y]; Palpitations [ ] ; Syncope [ ] ; Presyncope [ ] ; Paroxysmal nocturnal dyspnea[ ]   . Pulmonary: Cough [ ] ; Wheezing[ ] ; Hemoptysis[ ] ; Sputum [ ] ; Snoring [ ]   . GI: Vomiting[ ] ; Dysphagia[ ] ; Melena[ ] ; Hematochezia [ ] ; Heartburn[ ] ; Abdominal pain [ ] ; Constipation [ ] ; Diarrhea [ ] ; BRBPR [ ]   . GU: Hematuria[ ] ; Dysuria [ ] ; Nocturia[ ]   . Vascular: Pain in legs with walking [Y]; Pain in feet with lying flat [ ] ; Non-healing sores [ ] ; Stroke [ ] ; TIA [ ] ; Slurred speech [ ] ;  . Neuro: Headaches[ ] ; Vertigo[ ] ; Seizures[ ] ; Paresthesias[  ];Blurred vision [ ] ; Diplopia [ ] ; Vision changes [ ]   . Ortho/Skin: Arthritis [ ] ; Joint pain [ ] ; Muscle pain [ ] ; Joint swelling [ ] ; Back Pain [ ] ; Rash [ ]   . Psych: Depression[ ] ; Anxiety[ ]   . Heme: Bleeding problems [ ] ; Clotting disorders [ ] ; Anemia [ ]   . Endocrine: Diabetes [ ] ; Thyroid dysfunction[ ]      Objective:    Vital Signs:   Temp:  [98.6 F (37 C)-99.8 F (37.7 C)] 99.8 F (37.7 C) (03/28 1325) Pulse Rate:  [85-103] 88 (03/28 1930) Resp:  [13-18] 13 (03/28 1930) BP: (108-121)/(41-95) 121/95 (03/28 1930) SpO2:  [92 %-100 %] 100 % (03/28 1930) Weight:  [74.8 kg] 74.8 kg (03/28 1213)    Weight change: Filed Weights   12/22/19 1213  Weight: 74.8 kg    Intake/Output:   Intake/Output Summary (Last 24 hours) at 12/22/2019 2116 Last data filed at 12/22/2019 1753 Gross per 24 hour  Intake 1100 ml  Output --  Net 1100 ml      Physical Exam    General: Morbidly obese, alert and oriented HEENT: normal Neck: supple.  No jugular venous distention. Cor: Regular rate and rhythm, no murmurs Lungs: Clear to auscultation anteriorly Abdomen: soft, nontender, nondistended. No hepatosplenomegaly. No bruits or masses.  Extremities: no cyanosis, clubbing, rash. Trivial bilateral lower extremity edema. Neuro: alert & orientedx3, cranial  nerves grossly intact. moves all 4 extremities w/o difficulty.  Affect normal    Labs   Basic Metabolic Panel: Recent Labs  Lab 12/22/19 1243  NA 136  K 4.7  CL 97*  CO2 28  GLUCOSE 119*  BUN 21  CREATININE 1.55*  CALCIUM 9.4    Liver Function Tests: Recent Labs  Lab 12/22/19 1243  AST 33  ALT 20  ALKPHOS 92  BILITOT 0.6  PROT 7.2  ALBUMIN 3.6   Recent Labs  Lab 12/22/19 1243  LIPASE 31   No results for input(s): AMMONIA in the last 168 hours.  CBC: Recent Labs  Lab 12/22/19 1243  WBC 9.3  NEUTROABS 7.5  HGB 12.1  HCT 39.4  MCV 96.8  PLT 154    Cardiac Enzymes: Recent Labs  Lab  12/22/19 1243  CKTOTAL 75    BNP: BNP (last 3 results) Recent Labs    01/24/19 1705 12/22/19 1243  BNP 49.9 136.0*    ProBNP (last 3 results) Recent Labs    11/05/19 1438  PROBNP 32.0     CBG: Recent Labs  Lab 12/22/19 1228  GLUCAP 98    Coagulation Studies: No results for input(s): LABPROT, INR in the last 72 hours.   Imaging   CT Head Wo Contrast  Result Date: 12/22/2019 CLINICAL DATA:  Altered mental status EXAM: CT HEAD WITHOUT CONTRAST TECHNIQUE: Contiguous axial images were obtained from the base of the skull through the vertex without intravenous contrast. COMPARISON:  CT head dated 01/24/2019 FINDINGS: Brain: No evidence of acute infarction, hemorrhage, hydrocephalus, extra-axial collection or mass lesion/mass effect. There is moderate cerebral volume loss with associated ex vacuo dilatation. Vascular: There are vascular calcifications in the carotid siphons. Skull: Normal. Negative for fracture or focal lesion. Sinuses/Orbits: No acute finding. Other: None. IMPRESSION: No acute intracranial process. Electronically Signed   By: Romona Curls M.D.   On: 12/22/2019 14:16   CT Angio Chest PE W/Cm &/Or Wo Cm  Result Date: 12/22/2019 CLINICAL DATA:  Weakness and chest pain. EXAM: CT ANGIOGRAPHY CHEST WITH CONTRAST TECHNIQUE: Multidetector CT imaging of the chest was performed using the standard protocol during bolus administration of intravenous contrast. Multiplanar CT image reconstructions and MIPs were obtained to evaluate the vascular anatomy. CONTRAST:  24mL OMNIPAQUE IOHEXOL 350 MG/ML SOLN COMPARISON:  11/14/2017 FINDINGS: Cardiovascular: Contrast injection is sufficient to demonstrate satisfactory opacification of the pulmonary arteries to the segmental level. There is no pulmonary embolus. The main pulmonary artery is within normal limits for size. There is no CT evidence of acute right heart strain. There are atherosclerotic changes of the thoracic aorta without  evidence for aneurysm. Heart size is enlarged. There are coronary artery calcifications without evidence for pericardial effusion. Mediastinum/Nodes: --No mediastinal or hilar lymphadenopathy. --No axillary lymphadenopathy. --No supraclavicular lymphadenopathy. --Normal thyroid gland. --there is some mild diffuse esophageal wall thickening. Lungs/Pleura: There are emphysematous changes bilaterally. There is atelectasis at the lung bases. There is debris within the trachea. There is some bronchial wall thickening and mucus plugging at the lung bases. There is a small right-sided pleural effusion. Upper Abdomen: No acute abnormality. Musculoskeletal: There is a slight irregularity of the T10 superior endplate, new since 2019. there is approximately 15% height loss anteriorly. There is no retropulsion. Review of the MIP images confirms the above findings. IMPRESSION: 1. No acute pulmonary embolism. 2. Findings suspicious for an acute or subacute mild compression fracture of the T10 vertebral body. 3. Bibasilar atelectasis with a trace right-sided pleural  effusion. 4. Mild diffuse esophageal wall thickening is nonspecific but can be seen in patients with esophagitis. 5. Mild mucous plugging and bronchial wall thickening at the lung bases suggestive of reactive or infectious bronchiolitis. Aortic Atherosclerosis (ICD10-I70.0) and Emphysema (ICD10-J43.9). Electronically Signed   By: Katherine Mantle M.D.   On: 12/22/2019 17:14   DG Chest Port 1 View  Result Date: 12/22/2019 CLINICAL DATA:  Cough and body aches. EXAM: PORTABLE CHEST 1 VIEW COMPARISON:  January 24, 2019 FINDINGS: The mediastinal contour and cardiac silhouette are stable. There is no focal infiltrate, pulmonary edema, or pleural effusion. No acute abnormality is identified in the osseous structures. IMPRESSION: No active disease. Electronically Signed   By: Sherian Rein M.D.   On: 12/22/2019 12:45       Assessment/Plan   1. Troponin  elevation  The patient presents with vague symptoms of generalized fatigue and weakness. She has elevated troponins that have stayed relatively flat when trended. She denies any chest pain. The ECG showed mild ST changes in the inferior leads that normalized subsequently.  Her presentation is not consistent with ACS. She does however have risk factors for coronary artery disease. Other etiologies to consider for elevation of hs-Troponin are AKI and possible increased loading conditions of the LV (due to the chronic diastolic function).  -Continue to trend biomarkers -Serial ECGs -Admit to telemetry -ASA 81 mg daily -Continue Plavix 75 mg daily -Agree with unfractionated heparin IV -Continue high dose statin (Atorvastatin  nightly) -Check a lipid panel -Repeat transthoracic echocardiogram to evaluate LV function   2. Chronic diastolic heart failure  The patient has complaints of chronic leg swelling. She takes Lasix at home. BNP is 136. Appears euvolemic on exam today  -Daily weights -Strict I and Os -Lasix as needed to maintain fluid status even to net negative.   Lonie Peak, MD  12/22/2019, 9:16 PM  Cardiology Overnight Team Please contact [facility] Cardiology for night-coverage after hours (4p -7a ) and weekends on [email or web address]

## 2019-12-22 NOTE — ED Provider Notes (Signed)
[facility]   CSN: [id number] Arrival date & time: 12/22/19  1157     History No chief complaint on file.   Elray McgregorHattie S [name] is a 74 y.o. female.  The history is provided by the patient, the EMS personnel and medical records. No language interpreter was used.   Elray McgregorHattie S [name] is a 74 y.o. female who presents to the Emergency Department complaining of weakness, body aches.  She presents to the ED by EMS for evaluation of progressive generalized weakness, body aches and malaise since receiving her second Moderna Covid19 vaccine on Wednesday.  She has generalized weakness, body aches/cramping, nausea, cough, and increased fatigue.  Sxs are severe, constant, worsening.  No fever, chest pain, vomiting, dysuria.  She lives at home with her daughter.      Past Medical History:  Diagnosis Date  . Aneurysm (HCC)   . Anxiety   . Anxiety and depression   . Carotid stenosis    R 50% (12/2012)  . Concussion 08/03/2015  . COPD (chronic obstructive pulmonary disease) (HCC) 12/2012   spirometry: Pre: FVC 84%, FEV1 69%, ratio 0.64 consistent with moderate obstruction.  . Depression   . Fall 08/03/2015   d/c home health 08/2015  . Fracture of cervical vertebra, C5 (HCC) 08/06/2015  . History of chicken pox   . Hyperlipidemia   . Hypertension   . Lower back pain    h/o HNP s/p surgery  . Osteoarthritis    h/o ruptured disc s/p ESI  . Osteoporosis 11/2010   DEXA -2.7 spine, thoracic compression fracture  . Peripheral vascular disease (HCC)   . Smoker    quit 10/2012  . Stroke [facility](HCC) 2010   x3 with residual R hemiparesis, s/p R MCA balloon angioplasty (2010)    Patient Active Problem List   Diagnosis Date Noted  . Acute midline back pain 12/21/2019  . Hearing loss due to cerumen impaction, left 07/08/2019  . Neuropathy 05/06/2019  . CKD (chronic kidney disease) stage 3, GFR 30-59 ml/min 01/24/2019  . Tinnitus of both ears 11/24/2018    . Vision loss of left eye 07/31/2018  . Thrombocytopenia (HCC) 07/31/2018  . Tremor 07/03/2018  . Chronic diastolic heart failure (HCC) 01/30/2018  . Memory loss 01/30/2018  . Peripheral edema 01/30/2018  . Intertrigo 01/30/2018  . Chronic back pain 11/21/2017  . Chronic respiratory failure with hypoxia (HCC) 11/14/2017  . Shortness of breath 11/14/2017  . Closed compression fracture of fourth lumbar vertebra (HCC) 10/02/2017  . Compression fracture of body of thoracic vertebra (HCC) 09/30/2017  . Hypothyroidism (acquired) 06/28/2017  . Vitamin D deficiency 06/25/2017  . Advanced care planning/counseling discussion 01/29/2015  . Encounter for general adult medical examination with abnormal findings 01/29/2015  . Carotid stenosis 04/03/2014  . Carotid artery aneurysm (HCC) 04/03/2014  . Recurrent falls 01/02/2014  . Hemiparesis affecting right side as late effect of stroke (HCC) 01/02/2014  . Insomnia 01/10/2013  . Medicare annual wellness visit, subsequent 11/26/2012  . Hypertension   . History of lacunar cerebrovascular accident (CVA)   . Peripheral vascular disease (HCC)   . COPD (chronic obstructive pulmonary disease) (HCC)   . Hyperlipidemia   . Osteoarthritis   . MDD (major depressive disorder), recurrent episode, moderate (HCC)   . Ex-smoker   . Osteoporosis 11/25/2010    Past Surgical History:  Procedure Laterality Date  . APPENDECTOMY  1960  . CATARACT EXTRACTION     bilateral  . CESAREAN SECTION    .  CHOLECYSTECTOMY  1970  . COLONOSCOPY  2004   diverticulosis, no polyps Jarold Motto)  . COLONOSCOPY  05/2016   decreased sphincter tone, diverticulosis, no f/u recommended (Danis)  . DEXA  11/2010   T -2.7 spine, -1.9 hip  . HIP SURGERY Left 2006   fractured - screws placed  . IR ANGIO INTRA EXTRACRAN SEL COM CAROTID INNOMINATE BILAT MOD SED  08/01/2018  . IR ANGIO INTRA EXTRACRAN SEL COM CAROTID INNOMINATE BILAT MOD SED  12/10/2018  . IR ANGIO VERTEBRAL SEL  SUBCLAVIAN INNOMINATE UNI R MOD SED  08/01/2018  . IR ANGIO VERTEBRAL SEL SUBCLAVIAN INNOMINATE UNI R MOD SED  12/10/2018  . IR ANGIO VERTEBRAL SEL VERTEBRAL UNI L MOD SED  12/10/2018  . IR RADIOLOGIST EVAL & MGMT  01/09/2018  . KYPHOPLASTY  10/02/2017   Procedure: LUMBAR FOUR KYPHOPLASTY;  Surgeon: Coletta Memos, MD;  Location[facility] OR;  Service: Neurosurgery;;  . RADIOLOGY WITH ANESTHESIA N/A 11/11/2015   Procedure: RADIOLOGY WITH ANESTHESIA;  Surgeon: Julieanne Cotton, MD;  Location: [facility] OR;  Service: Radiology;  Laterality: N/A;  . RADIOLOGY WITH ANESTHESIA N/A 12/10/2018   Procedure: STENTING;  Surgeon: Julieanne Cotton, MD;  Location: [facility] OR;  Service: Radiology;  Laterality: N/A;     OB History   No obstetric history on file.     Family History  Problem Relation Age of Onset  . CAD Mother        MI  . Cancer Mother        cervical  . CAD Maternal Aunt        MI  . CAD Sister        MI  . Sudden death Father 43       died in his sleep, chain smoker  . Cirrhosis Father   . Hypertension Daughter   . Diabetes Maternal Aunt   . Cancer Maternal Grandmother        cervical    Social History   Tobacco Use  . Smoking status: Former Smoker    Packs/day: 2.00    Years: 46.00    Pack years: 92.00    Types: Cigarettes    Quit date: 11/08/2012    Years since quitting: 7.1  . Smokeless tobacco: Never Used  Substance Use Topics  . Alcohol use: No    Alcohol/week: 0.0 standard drinks  . Drug use: No    Home Medications Prior to Admission medications   Medication Sig Start Date End Date Taking? Authorizing Provider  acetaminophen (TYLENOL) 500 MG tablet Take 1 tablet (500 mg total) by mouth every 6 (six) hours as needed for headache. 01/01/19   [name], MD  albuterol (PROVENTIL HFA;VENTOLIN HFA) 108 (90 Base) MCG/ACT inhaler Inhale 2 puffs into the lungs every 6 (six) hours as needed for wheezing or shortness of breath. 11/19/18   [name], MD  aspirin (ASPIRIN  81) 81 MG EC tablet Take 1 tablet (81 mg total) by mouth daily. Swallow whole. 12/15/18   [name], MD  atorvastatin (LIPITOR) 40 MG tablet TAKE 1 TABLET EVERY DAY 09/19/19   [name], MD  benazepril (LOTENSIN) 5 MG tablet Take 1 tablet (5 mg total) by mouth daily. 11/20/17   [name], MD  CALCIUM-VITAMIN D PO Take 1 tablet by mouth daily.    [provider]  Cholecalciferol (VITAMIN D3) 125 MCG (5000 UT) CAPS Take by mouth daily.    [provider]  Cholecalciferol (VITAMIN D3) 25 MCG (1000 UT) CAPS Take 1,000  Units by mouth daily.    [provider]  clopidogrel (PLAVIX) 75 MG tablet TAKE 1 TABLET (75 MG TOTAL) BY MOUTH DAILY. 11/19/19   [name], MD  Cranberry-Vitamin C (AZO CRANBERRY URINARY TRACT PO) Take by mouth in the morning and at bedtime.    [provider]  ezetimibe (ZETIA) 10 MG tablet Take 1 tablet (10 mg total) by mouth daily. 12/19/19   [name], MD  fluticasone-salmeterol (ADVAIR HFA) [phone] MCG/ACT inhaler Inhale 2 puffs into the lungs 2 (two) times daily. Rinse mouth after use. 11/21/17   Shane Crutch, MD  furosemide (LASIX) 40 MG tablet Take 1 tablet (40 mg total) by mouth daily. 11/21/19   [name], MD  gabapentin (NEURONTIN) 100 MG capsule Take 1 capsule (100 mg total) by mouth at bedtime. 06/21/19   [name], MD  Homeopathic Products (ZINC COLD THERAPY PO) Take by mouth daily.    [provider]  levothyroxine (SYNTHROID) 50 MCG tablet TAKE 1 TABLET  DAILY BEFORE BREAKFAST. 09/12/19   [name], MD  Multiple Vitamins-Minerals (MULTIVITAMIN PO) Take 1 tablet by mouth daily.    [provider]  ondansetron (ZOFRAN) 4 MG tablet Take 1 tablet (4 mg total) by mouth every 8 (eight) hours as needed for nausea or vomiting. 12/20/19   [name], MD  OXYGEN Inhale 2 L into the lungs at bedtime.    [provider]  pantoprazole (PROTONIX) 40 MG  tablet Take 1 tablet (40 mg total) by mouth daily. For 2 weeks then as needed 06/21/19   [name], MD  Potassium Chloride ER 20 MEQ TBCR TAKE 2 TABLETS BY MOUTH AS NEEDED (WHENEVER  YOU  TAKE  LASIX) 11/07/19   [name], MD  sertraline (ZOLOFT) 100 MG tablet TAKE 1 TABLET EVERY DAY 09/19/19   [name], MD  traMADol (ULTRAM) 50 MG tablet TAKE 1 TO 2 TABLETS BY MOUTH THREE TIMES DAILY AS NEEDED FOR MODERATE PAIN 11/29/19   [name], MD  traZODone (DESYREL) 100 MG tablet Take 1.5 tablets (150 mg total) by mouth at bedtime. 11/21/19   [name], MD  trimethoprim (TRIMPEX) 100 MG tablet Take 1 tablet (100 mg total) by mouth daily. Take daily to prevent UTI 11/05/19   Carman Ching, PA-C    Allergies    Doxycycline  Review of Systems   Review of Systems  All other systems reviewed and are negative.   Physical Exam Updated Vital Signs BP (!) 114/51   Pulse 90   Temp 99.8 F (37.7 C) (Rectal)   Resp 14   Ht 4\' 11"  (1.499 m)   Wt 74.8 kg   SpO2 100%   BMI 33.33 kg/m   Physical Exam Vitals and nursing note reviewed.  Constitutional:      Appearance: She is well-developed.  HENT:     Head: Normocephalic and atraumatic.  Cardiovascular:     Rate and Rhythm: Normal rate and regular rhythm.     Heart sounds: No murmur.  Pulmonary:     Effort: Pulmonary effort is normal. No respiratory distress.     Breath sounds: Normal breath sounds.  Abdominal:     Palpations: Abdomen is soft.     Tenderness: There is no guarding or rebound.     Comments: Mild generalized abdominal tenderness  Musculoskeletal:        General: No tenderness.  Skin:    General: Skin is warm and dry.  Neurological:     Mental Status: She  is alert.     Comments: Alert, oriented to person and place, disoriented to time.  Generalized weakness, greatest in her lower extremities.    Psychiatric:        Behavior: Behavior normal.     ED Results / Procedures /  Treatments   Labs (all labs ordered are listed, but only abnormal results are displayed) Labs Reviewed  COMPREHENSIVE METABOLIC PANEL - Abnormal; Notable for the following components:      Result Value   Chloride 97 (*)    Glucose, Bld 119 (*)    Creatinine, Ser 1.55 (*)    GFR calc non Af Amer 33 (*)    GFR calc Af Amer 38 (*)    All other components within normal limits  URINALYSIS, ROUTINE W REFLEX MICROSCOPIC - Abnormal; Notable for the following components:   Leukocytes,Ua MODERATE (*)    Bacteria, UA RARE (*)    All other components within normal limits  D-DIMER, QUANTITATIVE (NOT AT [name]) - Abnormal; Notable for the following components:   D-Dimer, Quant 3.52 (*)    All other components within normal limits  BRAIN NATRIURETIC PEPTIDE - Abnormal; Notable for the following components:   B Natriuretic Peptide 136.0 (*)    All other components within normal limits  TROPONIN I (HIGH SENSITIVITY) - Abnormal; Notable for the following components:   Troponin I (High Sensitivity) 403 (*)    All other components within normal limits  TROPONIN I (HIGH SENSITIVITY) - Abnormal; Notable for the following components:   Troponin I (High Sensitivity) 392 (*)    All other components within normal limits  URINE CULTURE  SARS CORONAVIRUS 2 (TAT 6-24 HRS)  CBC WITH DIFFERENTIAL/PLATELET  LIPASE, BLOOD  CK  HEPARIN LEVEL (UNFRACTIONATED)  HEPARIN LEVEL (UNFRACTIONATED)  CBC  CBG MONITORING, ED    EKG EKG Interpretation  Date/Time:  Sunday December 22 2019 14:32:44 EDT Ventricular Rate:  93 PR Interval:    QRS Duration: 85 QT Interval:  365 QTC Calculation: 454 R Axis:   31 Text Interpretation: Sinus rhythm Abnormal R-wave progression, early transition Confirmed by Tilden Fossa [phone]) on 12/22/2019 2:46:06 PM   Radiology CT Head Wo Contrast  Result Date: 12/22/2019 CLINICAL DATA:  Altered mental status EXAM: CT HEAD WITHOUT CONTRAST TECHNIQUE: Contiguous axial images were  obtained from the base of the skull through the vertex without intravenous contrast. COMPARISON:  CT head dated 01/24/2019 FINDINGS: Brain: No evidence of acute infarction, hemorrhage, hydrocephalus, extra-axial collection or mass lesion/mass effect. There is moderate cerebral volume loss with associated ex vacuo dilatation. Vascular: There are vascular calcifications in the carotid siphons. Skull: Normal. Negative for fracture or focal lesion. Sinuses/Orbits: No acute finding. Other: None. IMPRESSION: No acute intracranial process. Electronically Signed   By: Romona Curls M.D.   On: 12/22/2019 14:16   DG Chest Port 1 View  Result Date: 12/22/2019 CLINICAL DATA:  Cough and body aches. EXAM: PORTABLE CHEST 1 VIEW COMPARISON:  January 24, 2019 FINDINGS: The mediastinal contour and cardiac silhouette are stable. There is no focal infiltrate, pulmonary edema, or pleural effusion. No acute abnormality is identified in the osseous structures. IMPRESSION: No active disease. Electronically Signed   By: Sherian Rein M.D.   On: 12/22/2019 12:45    Procedures Procedures (including critical care time) CRITICAL CARE Performed by: Tilden Fossa   Total critical care time: 40 minutes  Critical care time was exclusive of separately billable procedures and treating other patients.  Critical care was necessary to treat  or prevent imminent or life-threatening deterioration.  Critical care was time spent personally by me on the following activities: development of treatment plan with patient and/or surrogate as well as nursing, discussions with consultants, evaluation of patient's response to treatment, examination of patient, obtaining history from patient or surrogate, ordering and performing treatments and interventions, ordering and review of laboratory studies, ordering and review of radiographic studies, pulse oximetry and re-evaluation of patient's condition.  Medications Ordered in ED Medications    aspirin chewable tablet 324 mg (has no administration in time range)  heparin bolus via infusion 4,200 Units (has no administration in time range)  heparin ADULT infusion 100 units/mL (25000 units/239mL sodium chloride 0.45%) (has no administration in time range)  sodium chloride 0.9 % bolus 500 mL (0 mLs Intravenous Stopped 12/22/19 1400)  sodium chloride 0.9 % bolus 500 mL (500 mLs Intravenous New Bag/Given 12/22/19 1511)  cefTRIAXone (ROCEPHIN) 1 g in sodium chloride 0.9 % 100 mL IVPB (1 g Intravenous New Bag/Given 12/22/19 1512)  iohexol (OMNIPAQUE) 350 MG/ML injection 50 mL (50 mLs Intravenous Contrast Given 12/22/19 1702)    ED Course  I have reviewed the triage vital signs and the nursing notes.  Pertinent labs & imaging results that were available during my care of the patient were reviewed by me and considered in my medical decision making (see chart for details).    MDM Rules/Calculators/A&P                     Patient here for evaluation of generalized weakness, malaise and body aches since receiving her second COVID-19 vaccine. She is chronically ill appearing on evaluation with profound generalized weakness, requiring to person assist to sit up in the stretcher. EKG demonstrates ST depression in the inferior lateral leads with sinus tachycardia, change compared to priors. Initial troponin is mildly elevated. On initial assessment patient denies any chest pain, but on further questioning she does state that she at times does have some chest pain but is unable to describe any further. Serial EKG in the department with resolution of her ST depression when compared to previous. D dimer was obtained, which was elevated and a CTA was ordered. She is treated with aspirin, heparin drip for NSTEMI.  Pt and daughter updated of findings of studies and recommendation for further treatment.    Pt care transferred pending CTA.  Final Clinical Impression(s) / ED Diagnoses Final diagnoses:  None     Rx / DC Orders ED Discharge Orders    None       Quintella Reichert, MD 12/22/19 [phone]

## 2019-12-22 NOTE — ED Triage Notes (Signed)
Presents via EMS with c/o generalized weakness, lethargy, muscle spasms, nausea since second covid dose 4 days ago.  Seen by PMD 2 days ago and no treatment needed.  Sx progressively worse and with decreased intake.  Congested cough noted over the last few days.  Pain noted to abd with palp

## 2019-12-22 NOTE — H&P (Signed)
History and Physical   [name] [name]:[id number] DOB: December 13, 1945 DOA: 12/22/2019  Referring MD/NP/PA: Dr. Madilyn Hook  PCP: [name], MD   Outpatient Specialists: None  Patient coming from: Home  Chief Complaint: Generalized weakness lethargy muscle spasm  HPI: [name] [name] is a 74 y.o. female with medical history significant of COPD, hypertension, osteoarthritis, osteoporosis, anxiety with depression, carotid stenosis, CVA status post residual right-sided weakness, diastolic heart failure, mild aortic stenosis former smoker with peripheral vascular disease, hyperlipidemia and spinal compression fractures, morbidly obese coming to the hospital with generalized fatigue weakness and muscle spasms since she got her second more than a vaccine on March 23.  Patient has had some shortness of breath and lower extremity edema within the last month.  She is known to have diastolic heart failure.  She denied any significant chest pain but generally some discomfort.  Patient was noted to have elevated troponins in the ER.  No evidence of PE.  Had transient ST changes on the EKG.  Patient is suspected to have had some type of cardiomyopathy and is being admitted to the hospital for treatment..  ED Course: Temperature 99.8 blood pressure 120/95 pulse 103 respirate of 18 oxygen sats 92% on room air.  Sodium 136 potassium 4.7 chloride 97 CO2 28 with glucose 119.  Creatinine is 1.55.  BNP 136.  CK 75 initial troponin IV 03 and then 392.  CBC entirely within normal.  Chest x-ray showed no acute finding.  19 screen is negative.  Urinalysis showed moderate leukocytes WBC 11-20.  Urine cultures have been obtained, cardiology consulted and patient being admitted for treatment  Review of Systems: As per HPI otherwise 10 point review of systems negative.    Past Medical History:  Diagnosis Date  . Aneurysm (HCC)   . Anxiety   . Anxiety and depression   . Carotid stenosis    R 50% (12/2012)  .  Concussion 08/03/2015  . COPD (chronic obstructive pulmonary disease) (HCC) 12/2012   spirometry: Pre: FVC 84%, FEV1 69%, ratio 0.64 consistent with moderate obstruction.  . Depression   . Fall 08/03/2015   d/c home health 08/2015  . Fracture of cervical vertebra, C5 (HCC) 08/06/2015  . History of chicken pox   . Hyperlipidemia   . Hypertension   . Lower back pain    h/o HNP s/p surgery  . Osteoarthritis    h/o ruptured disc s/p ESI  . Osteoporosis 11/2010   DEXA -2.7 spine, thoracic compression fracture  . Peripheral vascular disease (HCC)   . Smoker    quit 10/2012  . Stroke [facility]) 2010   x3 with residual R hemiparesis, s/p R MCA balloon angioplasty (2010)    Past Surgical History:  Procedure Laterality Date  . APPENDECTOMY  1960  . CATARACT EXTRACTION     bilateral  . CESAREAN SECTION    . CHOLECYSTECTOMY  1970  . COLONOSCOPY  2004   diverticulosis, no polyps Jarold Motto)  . COLONOSCOPY  05/2016   decreased sphincter tone, diverticulosis, no f/u recommended ([place])  . DEXA  11/2010   T -2.7 spine, -1.9 hip  . HIP SURGERY Left 2006   fractured - screws placed  . IR ANGIO INTRA EXTRACRAN SEL COM CAROTID INNOMINATE BILAT MOD SED  08/01/2018  . IR ANGIO INTRA EXTRACRAN SEL COM CAROTID INNOMINATE BILAT MOD SED  12/10/2018  . IR ANGIO VERTEBRAL SEL SUBCLAVIAN INNOMINATE UNI R MOD SED  08/01/2018  . IR ANGIO VERTEBRAL SEL SUBCLAVIAN INNOMINATE UNI R  MOD SED  12/10/2018  . IR ANGIO VERTEBRAL SEL VERTEBRAL UNI L MOD SED  12/10/2018  . IR RADIOLOGIST EVAL & MGMT  01/09/2018  . KYPHOPLASTY  10/02/2017   Procedure: LUMBAR FOUR KYPHOPLASTY;  Surgeon: Coletta Memos, MD;  Location: [facility] OR;  Service: Neurosurgery;;  . RADIOLOGY WITH ANESTHESIA N/A 11/11/2015   Procedure: RADIOLOGY WITH ANESTHESIA;  Surgeon: Julieanne Cotton, MD;  Location: [facility] OR;  Service: Radiology;  Laterality: N/A;  . RADIOLOGY WITH ANESTHESIA N/A 12/10/2018   Procedure: STENTING;  Surgeon: Julieanne Cotton, MD;  Location: [facility]  OR;  Service: Radiology;  Laterality: N/A;     reports that she quit smoking about 7 years ago. Her smoking use included cigarettes. She has a 92.00 pack-year smoking history. She has never used smokeless tobacco. She reports that she does not drink alcohol or use drugs.  Allergies  Allergen Reactions  . Doxycycline Nausea Only and Other (See Comments)    Sig GI upset  . Fluticasone-Salmeterol Anxiety    Family History  Problem Relation Age of Onset  . CAD Mother        MI  . Cancer Mother        cervical  . CAD Maternal Aunt        MI  . CAD Sister        MI  . Sudden death Father 79       died in his sleep, chain smoker  . Cirrhosis Father   . Hypertension Daughter   . Diabetes Maternal Aunt   . Cancer Maternal Grandmother        cervical     Prior to Admission medications   Medication Sig Start Date End Date Taking? Authorizing Provider  acetaminophen (TYLENOL) 500 MG tablet Take 1 tablet (500 mg total) by mouth every 6 (six) hours as needed for headache. Patient taking differently: Take 500 mg by mouth every 6 (six) hours as needed for mild pain or headache.  01/01/19  Yes [name], MD  albuterol (PROVENTIL HFA;VENTOLIN HFA) 108 (90 Base) MCG/ACT inhaler Inhale 2 puffs into the lungs every 6 (six) hours as needed for wheezing or shortness of breath. 11/19/18  Yes [name], MD  aspirin (ASPIRIN 81) 81 MG EC tablet Take 1 tablet (81 mg total) by mouth daily. Swallow whole. 12/15/18  Yes [name], MD  atorvastatin (LIPITOR) 40 MG tablet TAKE 1 TABLET EVERY DAY Patient taking differently: Take 40 mg by mouth daily.  09/19/19  Yes [name], MD  benazepril (LOTENSIN) 5 MG tablet Take 1 tablet (5 mg total) by mouth daily. 11/20/17  Yes [name], MD  Cholecalciferol (VITAMIN D3) 125 MCG (5000 UT) CAPS Take 5,000 Units by mouth daily.    Yes [provider]  Cholecalciferol (VITAMIN D3) 25 MCG (1000 UT) CAPS Take 1,000 Units by  mouth daily.   Yes [provider]  clopidogrel (PLAVIX) 75 MG tablet TAKE 1 TABLET (75 MG TOTAL) BY MOUTH DAILY. 11/19/19  Yes [name], MD  Cranberry-Vitamin C (AZO CRANBERRY URINARY TRACT PO) Take 1 tablet by mouth in the morning and at bedtime.    Yes [provider]  furosemide (LASIX) 40 MG tablet Take 20 mg by mouth daily.  11/21/19  Yes [name], MD  gabapentin (NEURONTIN) 100 MG capsule Take 1 capsule (100 mg total) by mouth at bedtime. 06/21/19  Yes [name], MD  Homeopathic Products (ZINC COLD THERAPY PO) Take 1 tablet by mouth daily.    Yes  [provider]  levothyroxine (SYNTHROID) 50 MCG tablet TAKE 1 TABLET  DAILY BEFORE BREAKFAST. Patient taking differently: Take 50 mcg by mouth daily before breakfast.  09/12/19  Yes [name], MD  Multiple Vitamins-Minerals (MULTIVITAMIN PO) Take 1 tablet by mouth daily.   Yes [provider]  ondansetron (ZOFRAN) 4 MG tablet Take 1 tablet (4 mg total) by mouth every 8 (eight) hours as needed for nausea or vomiting. 12/20/19  Yes [name], MD  OXYGEN Inhale 2 L/min into the lungs See admin instructions. Inhale 2 L/min of oxygen at bedtime and during the day as needed for shortness of breath   Yes [provider]  pantoprazole (PROTONIX) 40 MG tablet Take 1 tablet (40 mg total) by mouth daily. For 2 weeks then as needed Patient taking differently: Take 40 mg by mouth every other day.  06/21/19  Yes [name], MD  Potassium Chloride ER 20 MEQ TBCR TAKE 2 TABLETS BY MOUTH AS NEEDED (WHENEVER  YOU  TAKE  LASIX) Patient taking differently: Take 20 mEq by mouth daily.  11/07/19  Yes [name], MD  sertraline (ZOLOFT) 100 MG tablet TAKE 1 TABLET EVERY DAY Patient taking differently: Take 100 mg by mouth daily.  09/19/19  Yes [name], MD  traMADol (ULTRAM) 50 MG tablet TAKE 1 TO 2 TABLETS BY MOUTH THREE TIMES DAILY AS NEEDED FOR MODERATE PAIN  Patient taking differently: Take 50-100 mg by mouth 3 (three) times daily as needed for moderate pain.  11/29/19  Yes [name], MD  traZODone (DESYREL) 100 MG tablet Take 1.5 tablets (150 mg total) by mouth at bedtime. 11/21/19  Yes [name], MD  trimethoprim (TRIMPEX) 100 MG tablet Take 1 tablet (100 mg total) by mouth daily. Take daily to prevent UTI 11/05/19  Yes Vaillancourt, Lelon Mast, PA-C  ezetimibe (ZETIA) 10 MG tablet Take 1 tablet (10 mg total) by mouth daily. 12/19/19   [name], MD  fluticasone-salmeterol (ADVAIR HFA) [phone] MCG/ACT inhaler Inhale 2 puffs into the lungs 2 (two) times daily. Rinse mouth after use. Patient not taking: Reported on 12/22/2019 11/21/17   Shane Crutch, MD    Physical Exam: Vitals:   12/22/19 1500 12/22/19 1915 12/22/19 1930 12/22/19 2124  BP: (!) 114/51 108/88 (!) 121/95 (!) 98/48  Pulse: 90 85 88 80  Resp: 14 14 13 15   Temp:    98.4 F (36.9 C)  TempSrc:    Oral  SpO2: 100% 100% 100% 97%  Weight:      Height:          Constitutional: Acutely ill looking and weak Vitals:   12/22/19 1500 12/22/19 1915 12/22/19 1930 12/22/19 2124  BP: (!) 114/51 108/88 (!) 121/95 (!) 98/48  Pulse: 90 85 88 80  Resp: 14 14 13 15   Temp:    98.4 F (36.9 C)  TempSrc:    Oral  SpO2: 100% 100% 100% 97%  Weight:      Height:       Eyes: PERRL, lids and conjunctivae normal ENMT: Mucous membranes are dry. Posterior pharynx clear of any exudate or lesions.Normal dentition.  Neck: normal, supple, no masses, no thyromegaly Respiratory: clear to auscultation bilaterally, no wheezing, no crackles. Normal respiratory effort. No accessory muscle use.  Cardiovascular: Sinus tachycardia, no murmurs / rubs / gallops. No extremity edema. 2+ pedal pulses. No carotid bruits.  Abdomen: no tenderness, no masses palpated. No hepatosplenomegaly. Bowel sounds positive.  Musculoskeletal: no clubbing / cyanosis. No joint deformity upper and  lower  extremities. Good ROM, no contractures. Normal muscle tone.  Skin: no rashes, lesions, ulcers. No induration Neurologic: CN 2-12 grossly intact. Sensation intact, DTR normal. Strength 5/5 in all 4.  Psychiatric: Normal judgment and insight. Alert and oriented x 3.  Depressed mood.     Labs on Admission: I have personally reviewed following labs and imaging studies  CBC: Recent Labs  Lab 12/22/19 1243  WBC 9.3  NEUTROABS 7.5  HGB 12.1  HCT 39.4  MCV 96.8  PLT 154   Basic Metabolic Panel: Recent Labs  Lab 12/22/19 1243  NA 136  K 4.7  CL 97*  CO2 28  GLUCOSE 119*  BUN 21  CREATININE 1.55*  CALCIUM 9.4   GFR: Estimated Creatinine Clearance: 28.5 mL/min (A) (by C-G formula based on SCr of 1.55 mg/dL (H)). Liver Function Tests: Recent Labs  Lab 12/22/19 1243  AST 33  ALT 20  ALKPHOS 92  BILITOT 0.6  PROT 7.2  ALBUMIN 3.6   Recent Labs  Lab 12/22/19 1243  LIPASE 31   No results for input(s): AMMONIA in the last 168 hours. Coagulation Profile: No results for input(s): INR, PROTIME in the last 168 hours. Cardiac Enzymes: Recent Labs  Lab 12/22/19 1243  CKTOTAL 75   BNP (last 3 results) Recent Labs    11/05/19 1438  PROBNP 32.0   HbA1C: No results for input(s): HGBA1C in the last 72 hours. CBG: Recent Labs  Lab 12/22/19 1228  GLUCAP 98   Lipid Profile: No results for input(s): CHOL, HDL, LDLCALC, TRIG, CHOLHDL, LDLDIRECT in the last 72 hours. Thyroid Function Tests: Recent Labs    12/22/19 1243  TSH 2.660   Anemia Panel: No results for input(s): VITAMINB12, FOLATE, FERRITIN, TIBC, IRON, RETICCTPCT in the last 72 hours. Urine analysis:    Component Value Date/Time   COLORURINE YELLOW 12/22/2019 1245   APPEARANCEUR CLEAR 12/22/2019 1245   APPEARANCEUR Clear 11/05/2019 1015   LABSPEC 1.013 12/22/2019 1245   PHURINE 5.0 12/22/2019 1245   GLUCOSEU NEGATIVE 12/22/2019 1245   HGBUR NEGATIVE 12/22/2019 1245   BILIRUBINUR NEGATIVE 12/22/2019  1245   BILIRUBINUR Negative 11/05/2019 1015   KETONESUR NEGATIVE 12/22/2019 1245   PROTEINUR NEGATIVE 12/22/2019 1245   UROBILINOGEN 0.2 06/10/2019 1606   UROBILINOGEN 0.2 08/02/2015 1200   NITRITE NEGATIVE 12/22/2019 1245   LEUKOCYTESUR MODERATE (A) 12/22/2019 1245   Sepsis Labs: @LABRCNTIP (procalcitonin:4,lacticidven:4) ) Recent Results (from the past 240 hour(s))  SARS CORONAVIRUS 2 (TAT 6-24 HRS) Nasopharyngeal Nasopharyngeal Swab     Status: None   Collection Time: 12/22/19  3:15 PM   Specimen: Nasopharyngeal Swab  Result Value Ref Range Status   SARS Coronavirus 2 NEGATIVE NEGATIVE Final    Comment: (NOTE) SARS-CoV-2 target nucleic acids are NOT DETECTED. The SARS-CoV-2 RNA is generally detectable in upper and lower respiratory specimens during the acute phase of infection. Negative results do not preclude SARS-CoV-2 infection, do not rule out co-infections with other pathogens, and should not be used as the sole basis for treatment or other patient management decisions. Negative results must be combined with clinical observations, patient history, and epidemiological information. The expected result is Negative. Fact Sheet for Patients: HairSlick.no Fact Sheet for Healthcare Providers: quierodirigir.com This test is not yet approved or cleared by the [place] FDA and  has been authorized for detection and/or diagnosis of SARS-CoV-2 by FDA under an Emergency Use Authorization (EUA). This EUA will remain  in effect (meaning this test can be used) for the duration  of the COVID-19 declaration under Section 56 4(b)(1) of the Act, 21 U.S.C. section 360bbb-3(b)(1), unless the authorization is terminated or revoked sooner. Performed at [facility], [address] [place], [place] [address]      Radiological Exams on Admission: CT Head Wo Contrast  Result Date: 12/22/2019 CLINICAL DATA:  Altered mental  status EXAM: CT HEAD WITHOUT CONTRAST TECHNIQUE: Contiguous axial images were obtained from the base of the skull through the vertex without intravenous contrast. COMPARISON:  CT head dated 01/24/2019 FINDINGS: Brain: No evidence of acute infarction, hemorrhage, hydrocephalus, extra-axial collection or mass lesion/mass effect. There is moderate cerebral volume loss with associated ex vacuo dilatation. Vascular: There are vascular calcifications in the carotid siphons. Skull: Normal. Negative for fracture or focal lesion. Sinuses/Orbits: No acute finding. Other: None. IMPRESSION: No acute intracranial process. Electronically Signed   By: Romona Curls M.D.   On: 12/22/2019 14:16   CT Angio Chest PE W/Cm &/Or Wo Cm  Result Date: 12/22/2019 CLINICAL DATA:  Weakness and chest pain. EXAM: CT ANGIOGRAPHY CHEST WITH CONTRAST TECHNIQUE: Multidetector CT imaging of the chest was performed using the standard protocol during bolus administration of intravenous contrast. Multiplanar CT image reconstructions and MIPs were obtained to evaluate the vascular anatomy. CONTRAST:  50mL OMNIPAQUE IOHEXOL 350 MG/ML SOLN COMPARISON:  11/14/2017 FINDINGS: Cardiovascular: Contrast injection is sufficient to demonstrate satisfactory opacification of the pulmonary arteries to the segmental level. There is no pulmonary embolus. The main pulmonary artery is within normal limits for size. There is no CT evidence of acute right heart strain. There are atherosclerotic changes of the thoracic aorta without evidence for aneurysm. Heart size is enlarged. There are coronary artery calcifications without evidence for pericardial effusion. Mediastinum/Nodes: --No mediastinal or hilar lymphadenopathy. --No axillary lymphadenopathy. --No supraclavicular lymphadenopathy. --Normal thyroid gland. --there is some mild diffuse esophageal wall thickening. Lungs/Pleura: There are emphysematous changes bilaterally. There is atelectasis at the lung bases.  There is debris within the trachea. There is some bronchial wall thickening and mucus plugging at the lung bases. There is a small right-sided pleural effusion. Upper Abdomen: No acute abnormality. Musculoskeletal: There is a slight irregularity of the T10 superior endplate, new since 2019. there is approximately 15% height loss anteriorly. There is no retropulsion. Review of the MIP images confirms the above findings. IMPRESSION: 1. No acute pulmonary embolism. 2. Findings suspicious for an acute or subacute mild compression fracture of the T10 vertebral body. 3. Bibasilar atelectasis with a trace right-sided pleural effusion. 4. Mild diffuse esophageal wall thickening is nonspecific but can be seen in patients with esophagitis. 5. Mild mucous plugging and bronchial wall thickening at the lung bases suggestive of reactive or infectious bronchiolitis. Aortic Atherosclerosis (ICD10-I70.0) and Emphysema (ICD10-J43.9). Electronically Signed   By: Katherine Mantle M.D.   On: 12/22/2019 17:14   DG Chest Port 1 View  Result Date: 12/22/2019 CLINICAL DATA:  Cough and body aches. EXAM: PORTABLE CHEST 1 VIEW COMPARISON:  January 24, 2019 FINDINGS: The mediastinal contour and cardiac silhouette are stable. There is no focal infiltrate, pulmonary edema, or pleural effusion. No acute abnormality is identified in the osseous structures. IMPRESSION: No active disease. Electronically Signed   By: Sherian Rein M.D.   On: 12/22/2019 12:45    EKG: Independently reviewed.  It shows sinus rhythm with a rate of 94, transient ST changes, no significant findings  Assessment/Plan Principal Problem:   Myocarditis (HCC) Active Problems:   Hypertension   Peripheral vascular disease (HCC)  COPD (chronic obstructive pulmonary disease) (HCC)   Hyperlipidemia   Shortness of breath     #1 transient elevation of troponin: Suspected myocarditis but less likely.  This could be a reaction to the recent vaccination.  Patient has  risk factors for coronary artery disease and elevated troponin.  Will admit to complete cycling enzymes.  Get echocardiogram.  Continue per cardiology recommendations.  #2 hypertension: Confirm and resume home regimen and treat  #3 COPD: No exacerbation.  #4 hyperlipidemia: Continue with statin  #5 peripheral vascular disease: On Plavix.  Continue treatment  #6 generalized weakness: PT evaluation   DVT prophylaxis: Heparin drip Code Status: Full code Family Communication: Daughter in the room Disposition Plan: Home Consults called: Dr. Milta Deiters, cardiology Admission status: Inpatient  Severity of Illness: The appropriate patient status for this patient is INPATIENT. Inpatient status is judged to be reasonable and necessary in order to provide the required intensity of service to ensure the patient's safety. The patient's presenting symptoms, physical exam findings, and initial radiographic and laboratory data in the context of their chronic comorbidities is felt to place them at high risk for further clinical deterioration. Furthermore, it is not anticipated that the patient will be medically stable for discharge from the hospital within 2 midnights of admission. The following factors support the patient status of inpatient.   " The patient's presenting symptoms include generalized weakness and lethargy. " The worrisome physical exam findings include weak. " The initial radiographic and laboratory data are worrisome because of elevated troponin. " The chronic co-morbidities include hypertension with peripheral vascular disease.   * I certify that at the point of admission it is my clinical judgment that the patient will require inpatient hospital care spanning beyond 2 midnights from the point of admission due to high intensity of service, high risk for further deterioration and high frequency of surveillance required.Lonia Blood MD [facility] Pager [phone]  If 7PM-7AM, please contact night-coverage [email or web address] Password [facility]  12/22/2019, 11:46 PM

## 2019-12-23 ENCOUNTER — Inpatient Hospital Stay (HOSPITAL_COMMUNITY): Payer: Medicare HMO

## 2019-12-23 DIAGNOSIS — R531 Weakness: Secondary | ICD-10-CM

## 2019-12-23 DIAGNOSIS — I351 Nonrheumatic aortic (valve) insufficiency: Secondary | ICD-10-CM

## 2019-12-23 DIAGNOSIS — I350 Nonrheumatic aortic (valve) stenosis: Secondary | ICD-10-CM

## 2019-12-23 DIAGNOSIS — R778 Other specified abnormalities of plasma proteins: Secondary | ICD-10-CM

## 2019-12-23 LAB — COMPREHENSIVE METABOLIC PANEL
ALT: 18 U/L (ref 0–44)
AST: 25 U/L (ref 15–41)
Albumin: 2.9 g/dL — ABNORMAL LOW (ref 3.5–5.0)
Alkaline Phosphatase: 73 U/L (ref 38–126)
Anion gap: 7 (ref 5–15)
BUN: 16 mg/dL (ref 8–23)
CO2: 31 mmol/L (ref 22–32)
Calcium: 8.4 mg/dL — ABNORMAL LOW (ref 8.9–10.3)
Chloride: 102 mmol/L (ref 98–111)
Creatinine, Ser: 1.39 mg/dL — ABNORMAL HIGH (ref 0.44–1.00)
GFR calc Af Amer: 43 mL/min — ABNORMAL LOW (ref >60)
GFR calc non Af Amer: 38 mL/min — ABNORMAL LOW (ref >60)
Glucose, Bld: 109 mg/dL — ABNORMAL HIGH (ref 70–99)
Potassium: 4.6 mmol/L (ref 3.5–5.1)
Sodium: 140 mmol/L (ref 135–145)
Total Bilirubin: 0.6 mg/dL (ref 0.3–1.2)
Total Protein: 6 g/dL — ABNORMAL LOW (ref 6.5–8.1)

## 2019-12-23 LAB — ECHOCARDIOGRAM COMPLETE
Height: 59 in
Weight: 2640 oz

## 2019-12-23 LAB — CBC
HCT: 33 % — ABNORMAL LOW (ref 36.0–46.0)
Hemoglobin: 10 g/dL — ABNORMAL LOW (ref 12.0–15.0)
MCH: 29.6 pg (ref 26.0–34.0)
MCHC: 30.3 g/dL (ref 30.0–36.0)
MCV: 97.6 fL (ref 80.0–100.0)
Platelets: 132 10*3/uL — ABNORMAL LOW (ref 150–400)
RBC: 3.38 MIL/uL — ABNORMAL LOW (ref 3.87–5.11)
RDW: 13.1 % (ref 11.5–15.5)
WBC: 6.9 10*3/uL (ref 4.0–10.5)
nRBC: 0 % (ref 0.0–0.2)

## 2019-12-23 LAB — HEPARIN LEVEL (UNFRACTIONATED)
Heparin Unfractionated: 0.73 IU/mL — ABNORMAL HIGH (ref 0.30–0.70)
Heparin Unfractionated: 0.73 IU/mL — ABNORMAL HIGH (ref 0.30–0.70)
Heparin Unfractionated: 0.93 IU/mL — ABNORMAL HIGH (ref 0.30–0.70)

## 2019-12-23 LAB — TROPONIN I (HIGH SENSITIVITY): Troponin I (High Sensitivity): 208 ng/L (ref <18)

## 2019-12-23 MED ORDER — VITAMIN D 25 MCG (1000 UNIT) PO TABS
5000.0000 [IU] | ORAL_TABLET | Freq: Every day | ORAL | Status: DC
  Administered 2019-12-23 – 2019-12-26 (×4): 5000 [IU] via ORAL
  Filled 2019-12-23 (×8): qty 5

## 2019-12-23 MED ORDER — TRAMADOL HCL 50 MG PO TABS
50.0000 mg | ORAL_TABLET | Freq: Three times a day (TID) | ORAL | Status: DC | PRN
  Administered 2019-12-23: 50 mg via ORAL
  Administered 2019-12-26: 100 mg via ORAL
  Filled 2019-12-23: qty 2
  Filled 2019-12-23: qty 1

## 2019-12-23 MED ORDER — GABAPENTIN 100 MG PO CAPS
100.0000 mg | ORAL_CAPSULE | Freq: Every day | ORAL | Status: DC
  Administered 2019-12-23 – 2019-12-25 (×3): 100 mg via ORAL
  Filled 2019-12-23 (×3): qty 1

## 2019-12-23 MED ORDER — EZETIMIBE 10 MG PO TABS
10.0000 mg | ORAL_TABLET | Freq: Every day | ORAL | Status: DC
  Administered 2019-12-23 – 2019-12-26 (×4): 10 mg via ORAL
  Filled 2019-12-23 (×4): qty 1

## 2019-12-23 MED ORDER — LEVOTHYROXINE SODIUM 50 MCG PO TABS
50.0000 ug | ORAL_TABLET | Freq: Every day | ORAL | Status: DC
  Administered 2019-12-23 – 2019-12-26 (×4): 50 ug via ORAL
  Filled 2019-12-23 (×4): qty 1

## 2019-12-23 MED ORDER — SERTRALINE HCL 100 MG PO TABS
100.0000 mg | ORAL_TABLET | Freq: Every day | ORAL | Status: DC
  Administered 2019-12-23 – 2019-12-26 (×4): 100 mg via ORAL
  Filled 2019-12-23 (×4): qty 1

## 2019-12-23 MED ORDER — TRIMETHOPRIM 100 MG PO TABS
100.0000 mg | ORAL_TABLET | Freq: Every day | ORAL | Status: DC
  Administered 2019-12-23 – 2019-12-26 (×4): 100 mg via ORAL
  Filled 2019-12-23 (×4): qty 1

## 2019-12-23 MED ORDER — ATORVASTATIN CALCIUM 40 MG PO TABS
40.0000 mg | ORAL_TABLET | Freq: Every day | ORAL | Status: DC
  Administered 2019-12-23 – 2019-12-26 (×4): 40 mg via ORAL
  Filled 2019-12-23 (×4): qty 1

## 2019-12-23 MED ORDER — ADULT MULTIVITAMIN W/MINERALS CH
ORAL_TABLET | Freq: Every day | ORAL | Status: DC
  Administered 2019-12-23 – 2019-12-26 (×4): 1 via ORAL
  Filled 2019-12-23 (×4): qty 1

## 2019-12-23 MED ORDER — ASPIRIN EC 81 MG PO TBEC
81.0000 mg | DELAYED_RELEASE_TABLET | Freq: Every day | ORAL | Status: DC
  Administered 2019-12-23 – 2019-12-26 (×4): 81 mg via ORAL
  Filled 2019-12-23 (×4): qty 1

## 2019-12-23 MED ORDER — PANTOPRAZOLE SODIUM 40 MG PO TBEC
40.0000 mg | DELAYED_RELEASE_TABLET | ORAL | Status: DC
  Administered 2019-12-23 – 2019-12-25 (×2): 40 mg via ORAL
  Filled 2019-12-23 (×4): qty 1

## 2019-12-23 MED ORDER — TRAZODONE HCL 150 MG PO TABS
150.0000 mg | ORAL_TABLET | Freq: Every day | ORAL | Status: DC
  Administered 2019-12-23 – 2019-12-25 (×3): 150 mg via ORAL
  Filled 2019-12-23 (×3): qty 1

## 2019-12-23 MED ORDER — FUROSEMIDE 10 MG/ML IJ SOLN
40.0000 mg | Freq: Once | INTRAMUSCULAR | Status: AC
  Administered 2019-12-23: 40 mg via INTRAVENOUS
  Filled 2019-12-23: qty 4

## 2019-12-23 MED ORDER — FUROSEMIDE 20 MG PO TABS
20.0000 mg | ORAL_TABLET | Freq: Every day | ORAL | Status: DC
  Administered 2019-12-23 – 2019-12-26 (×4): 20 mg via ORAL
  Filled 2019-12-23 (×4): qty 1

## 2019-12-23 MED ORDER — CLOPIDOGREL BISULFATE 75 MG PO TABS
75.0000 mg | ORAL_TABLET | Freq: Every day | ORAL | Status: DC
  Administered 2019-12-23 – 2019-12-26 (×4): 75 mg via ORAL
  Filled 2019-12-23 (×4): qty 1

## 2019-12-23 MED ORDER — POTASSIUM CHLORIDE CRYS ER 10 MEQ PO TBCR
20.0000 meq | EXTENDED_RELEASE_TABLET | Freq: Every day | ORAL | Status: DC
  Administered 2019-12-23 – 2019-12-26 (×4): 20 meq via ORAL
  Filled 2019-12-23 (×6): qty 2

## 2019-12-23 MED ORDER — ALBUTEROL SULFATE (2.5 MG/3ML) 0.083% IN NEBU
3.0000 mL | INHALATION_SOLUTION | Freq: Four times a day (QID) | RESPIRATORY_TRACT | Status: DC | PRN
  Administered 2019-12-23: 3 mL via RESPIRATORY_TRACT
  Filled 2019-12-23: qty 3

## 2019-12-23 NOTE — Progress Notes (Signed)
  Echocardiogram 2D Echocardiogram has been performed.  Endre Coutts A Benjie Ricketson 12/23/2019, 3:27 PM

## 2019-12-23 NOTE — Telephone Encounter (Signed)
Noted  

## 2019-12-23 NOTE — Evaluation (Signed)
Occupational Therapy Evaluation Patient Details Name: [name] [name] MRN: [id number] DOB: [birth year] Today's Date: 12/23/2019    History of Present Illness Pt is a 74 y.o. female admitted 12/22/19 with SOB, fatigue, weakness and muscle spasms since getting her second dose of COVID-19 vaccine on 3/23. Pt with transient elevation of troponin; suspected myocarditis, but less likely; could be reaction to recent vaccination. PMH includes COPD, HTN, stroke, PVD, osteoporosis, depression, cardotid stenosis.   Clinical Impression   Pt admitted with above dx. Pt currently with functional limitiations due to the deficits in decreased activity tolerance and decreased ability to care for self. Pt has been relying on family to assist and it has become taxing for pt and family as pt progressively becomes weaker. Pt maxA overall for ADL and x2 mins of standing tolerance at a time. Pt will benefit from skilled OT to increase their independence and safety with  ADL and balance to allow discharge to SNF. OT following acutely.  O2 89-90% O2 on RA with exertion.     Follow Up Recommendations  SNF    Equipment Recommendations  Other (comment)(to be determined at next venue)    Recommendations for Other Services       Precautions / Restrictions Precautions Precautions: Fall;Other (comment) Precaution Comments: Urine incontinence Restrictions Weight Bearing Restrictions: No      Mobility Bed Mobility Overal bed mobility: Needs Assistance Bed Mobility: Supine to Sit;Sit to Supine     Supine to sit: Min assist;HOB elevated Sit to supine: Mod assist   General bed mobility comments: MinA for trunk elevation to EOB and assist scooting to EOB; ModA for BLE movements  Transfers Overall transfer level: Needs assistance Equipment used: Rolling walker (2 wheeled) Transfers: Sit to/from Stand Sit to Stand: Mod assist         General transfer comment: RW for stability; modA for power up     Balance Overall balance assessment: Needs assistance   Sitting balance-Leahy Scale: Fair Sitting balance - Comments: Assist to don socks     Standing balance-Leahy Scale: Poor Standing balance comment: assist from RW for stability                           ADL either performed or assessed with clinical judgement   ADL Overall ADL's : Needs assistance/impaired Eating/Feeding: Set up;Sitting   Grooming: Min guard;Standing;Supervision/safety;Sitting Grooming Details (indicate cue type and reason): Pt able to lean on counter for grooming x2 mins prior to requiring seated rest break. Upper Body Bathing: Moderate assistance;Sitting;Standing   Lower Body Bathing: Maximal assistance;Sitting/lateral leans;Sit to/from stand   Upper Body Dressing : Minimal assistance;Sitting   Lower Body Dressing: Maximal assistance;Sitting/lateral leans;Sit to/from stand Lower Body Dressing Details (indicate cue type and reason): "my family helps me with that. Toilet Transfer: Min guard;Cueing for Acupuncturist and Hygiene: Moderate assistance;Cueing for safety;Sitting/lateral lean;Sit to/from stand       Functional mobility during ADLs: Minimal assistance;Cueing for safety;Rolling walker General ADL Comments: Pt requiring increased assist at home from family and safety at risk due to mobility and ADL are very taxing.     Vision Baseline Vision/History: No visual deficits Patient Visual Report: No change from baseline Vision Assessment?: No apparent visual deficits     Perception     Praxis      Pertinent Vitals/Pain Pain Assessment: Faces Faces Pain Scale: Hurts a little bit Pain Location: R hip Pain Descriptors / Indicators:  Discomfort Pain Intervention(s): Monitored during session     Hand Dominance Right   Extremity/Trunk Assessment Upper Extremity Assessment Upper Extremity Assessment: Generalized weakness   Lower  Extremity Assessment Lower Extremity Assessment: Generalized weakness   Cervical / Trunk Assessment Cervical / Trunk Assessment: Kyphotic   Communication Communication Communication: No difficulties   Cognition Arousal/Alertness: Awake/alert Behavior During Therapy: WFL for tasks assessed/performed Overall Cognitive Status: Within Functional Limits for tasks assessed                                 General Comments: A/O x4   General Comments  O2 ~89-90% on RA with exertion. Pt ambulating very short distance with RW    Exercises     Shoulder Instructions      Home Living Family/patient expects to be discharged to:: Skilled nursing facility Living Arrangements: Children Available Help at Discharge: Family;Available 24 hours/day Type of Home: House Home Access: Stairs to enter [employer] of Steps: 5 Entrance Stairs-Rails: Right Home Layout: One level               Home Equipment: Walker - 4 wheels   Additional Comments: Children and grandchildren provides 24/7 assist      Prior Functioning/Environment Level of Independence: Needs assistance  Gait / Transfers Assistance Needed: Reports she does not consistently use DME for ambulation, but someone "always helps"; assist on stairs ADL's / Homemaking Assistance Needed: Daughter or granddaughter assist with dressing, bathing (pt reports she stands in shower), household tasks and food prep   Comments: Wears 2L O2 at night and as needed during day        OT Problem List: Decreased strength;Impaired balance (sitting and/or standing);Decreased safety awareness;Decreased cognition;Decreased coordination;Impaired vision/perception;Decreased activity tolerance;Impaired UE functional use;Pain;Increased edema;Decreased knowledge of use of DME or AE      OT Treatment/Interventions: Self-care/ADL training;Therapeutic exercise;Energy conservation;DME and/or AE instruction;Therapeutic activities;Cognitive  remediation/compensation;Patient/family education;Balance training    OT Goals(Current goals can be found in the care plan section) Acute Rehab OT Goals Patient Stated Goal: my daughter and I agree to rehab before d/c home OT Goal Formulation: With patient Time For Goal Achievement: 01/06/20 Potential to Achieve Goals: Good ADL Goals Pt Will Perform Grooming: with supervision;standing Pt Will Perform Lower Body Dressing: with adaptive equipment;sitting/lateral leans;sit to/from stand;with mod assist Pt Will Transfer to Toilet: with min guard assist;ambulating;bedside commode Pt/caregiver will Perform Home Exercise Program: Increased strength;Both right and left upper extremity;With Supervision Additional ADL Goal #1: Pt will increase standing tolerance x6 mins when performing functional ADL task with supervisionA and no seated rest breaks.  OT Frequency: Min 2X/week   Barriers to D/C:            Co-evaluation              AM-PAC OT "6 Clicks" Daily Activity     Outcome Measure Help from another person eating meals?: None Help from another person taking care of personal grooming?: A Little Help from another person toileting, which includes using toliet, bedpan, or urinal?: A Lot Help from another person bathing (including washing, rinsing, drying)?: A Lot Help from another person to put on and taking off regular upper body clothing?: A Little Help from another person to put on and taking off regular lower body clothing?: A Lot 6 Click Score: 16   End of Session Equipment Utilized During Treatment: Gait belt;Rolling walker Nurse Communication: Mobility status  Activity Tolerance: Patient  tolerated treatment well Patient left: in bed;with call bell/phone within reach  OT Visit Diagnosis: Unsteadiness on feet (R26.81);Muscle weakness (generalized) (M62.81)                Time: 3664-4034 OT Time Calculation (min): 22 min Charges:  OT General Charges $OT Visit: 1 Visit OT  Evaluation $OT Eval Moderate Complexity: 1 Mod  Flora Lipps, OTR/L Acute Rehabilitation Services Pager: [phone] Office: [phone]   Danira Nylander C 12/23/2019, 3:53 PM

## 2019-12-23 NOTE — Progress Notes (Signed)
ANTICOAGULATION CONSULT NOTE  Pharmacy Consult for Heparin Indication: pulmonary embolus/elevated troponins  Allergies  Allergen Reactions  . Doxycycline Nausea Only and Other (See Comments)    Sig GI upset  . Fluticasone-Salmeterol Anxiety    Patient Measurements: Height: 4\' 11"  (149.9 cm) Weight: 165 lb (74.8 kg) IBW/kg (Calculated) : 43.2 Heparin Dosing Weight: 60.3 kg  Vital Signs: Temp: 97.6 F (36.4 C) (03/29 0438) Temp Source: Oral (03/29 0438) BP: 105/47 (03/29 0438) Pulse Rate: 79 (03/29 0438)  Labs: Recent Labs    12/22/19 1243 12/22/19 1529 12/23/19 0001 12/23/19 0724  HGB 12.1  --  10.0*  --   HCT 39.4  --  33.0*  --   PLT 154  --  132*  --   HEPARINUNFRC  --   --  0.93* 0.73*  CREATININE 1.55*  --  1.39*  --   CKTOTAL 75  --   --   --   TROPONINIHS 403* 392*  --   --     Estimated Creatinine Clearance: 31.8 mL/min (A) (by C-G formula based on SCr of 1.39 mg/dL (H)).   Medical History: Past Medical History:  Diagnosis Date  . Aneurysm (HCC)   . Anxiety   . Anxiety and depression   . Carotid stenosis    R 50% (12/2012)  . Concussion 08/03/2015  . COPD (chronic obstructive pulmonary disease) (HCC) 12/2012   spirometry: Pre: FVC 84%, FEV1 69%, ratio 0.64 consistent with moderate obstruction.  . Depression   . Fall 08/03/2015   d/c home health 08/2015  . Fracture of cervical vertebra, C5 (HCC) 08/06/2015  . History of chicken pox   . Hyperlipidemia   . Hypertension   . Lower back pain    h/o HNP s/p surgery  . Osteoarthritis    h/o ruptured disc s/p ESI  . Osteoporosis 11/2010   DEXA -2.7 spine, thoracic compression fracture  . Peripheral vascular disease (HCC)   . Smoker    quit 10/2012  . Stroke [facility]) 2010   x3 with residual R hemiparesis, s/p R MCA balloon angioplasty (2010)    Medications:  Scheduled:  . aspirin  81 mg Oral Daily  . atorvastatin  40 mg Oral Daily  . clopidogrel  75 mg Oral Daily  . ezetimibe  10 mg Oral Daily  .  furosemide  20 mg Oral Daily  . gabapentin  100 mg Oral QHS  . levothyroxine  50 mcg Oral QAC breakfast  . Multivitamin   Oral Daily  . pantoprazole  40 mg Oral QODAY  . Potassium Chloride ER  20 mEq Oral Daily  . sertraline  100 mg Oral Daily  . traZODone  150 mg Oral QHS  . trimethoprim  100 mg Oral Daily  . Vitamin D3  5,000 Units Oral Daily    Assessment: Patient is a 49 yof that presents to the ED with c/o weakness. The patient has a slight Trop bump and an elevated D.Dimer. Pharmacy has been asked to dose heparin at this time for PE. CTA negative for PE   Follow-up heparin level 0.73  Goal of Therapy:  Heparin level 0.3-0.7 units/ml Monitor platelets by anticoagulation protocol: Yes   Plan:  Decrease heparin to 850 units / hr 8 hour heparin level  Najma Bozarth A. 65, PharmD, BCPS, FNKF Clinical Pharmacist [place] Please utilize Amion for appropriate phone number to reach the unit pharmacist [facility])    12/23/2019,8:06 AM

## 2019-12-23 NOTE — Evaluation (Signed)
Physical Therapy Evaluation Patient Details Name: [name] [name] MRN: [id number] DOB: [birth year] Today's Date: 12/23/2019   History of Present Illness  Pt is a 74 y.o. female admitted 12/22/19 with SOB, fatigue, weakness and muscle spasms since getting her second dose of COVID-19 vaccine on 3/23. Pt with transient elevation of troponin; suspected myocarditis, but less likely; could be reaction to recent vaccination. PMH includes COPD, HTN, stroke, PVD, osteoporosis, depression, cardotid stenosis.    Clinical Impression  Pt presents with an overall decrease in functional mobility secondary to above. PTA, pt ambulatory with intermittent use of rollator, family assists with mobility and ADL tasks as needed. Today, pt required up to [facility] for limited mobility with RW. SpO2 down to 88% on RA while walking, maintaining >/90% with seated rest. Pt would benefit from continued acute PT services to maximize functional mobility and independence prior to d/c with SNF-level therapies; pt in agreement.     Follow Up Recommendations SNF;Supervision for mobility/OOB    Equipment Recommendations  (defer)    Recommendations for Other Services       Precautions / Restrictions Precautions Precautions: Fall;Other (comment) Precaution Comments: Urine incontinence Restrictions Weight Bearing Restrictions: No      Mobility  Bed Mobility Overal bed mobility: Needs Assistance Bed Mobility: Supine to Sit     Supine to sit: Min assist;HOB elevated     General bed mobility comments: MinA for UE support to scoot hips to EOB  Transfers Overall transfer level: Needs assistance Equipment used: Rolling walker (2 wheeled) Transfers: Sit to/from Stand Sit to Stand: Mod assist         General transfer comment: Performed 2x sit<>stand from EOB to RW, modA for trunk elevation and to maintain balance as pt with initial posterior lean  Ambulation/Gait Ambulation/Gait assistance: Min guard;Min  assist Gait Distance (Feet): 14 Feet Assistive device: Rolling walker (2 wheeled) Gait Pattern/deviations: Step-through pattern;Decreased stride length;Wide base of support;Trunk flexed Gait velocity: Decreased Gait velocity interpretation: <1.31 ft/sec, indicative of household ambulator General Gait Details: Slow, unsteady gait with RW and intermittent minA for balance; cues to increase step length as pt initially shuffling. SpO2 down to 88% on RA, returning to >/90% with seated rest. Further mobility limited by weakness and fatigue  Stairs            Wheelchair Mobility    Modified Rankin (Stroke Patients Only)       Balance Overall balance assessment: Needs assistance   Sitting balance-Leahy Scale: Fair Sitting balance - Comments: Assist to don socks     Standing balance-Leahy Scale: Poor Standing balance comment: Initial posterior lean requiring assist to prevent LOB; heavy reliance on BUE support                             Pertinent Vitals/Pain Pain Assessment: Faces Faces Pain Scale: Hurts a little bit Pain Location: R hip Pain Descriptors / Indicators: Discomfort Pain Intervention(s): Monitored during session;Repositioned    Home Living Family/patient expects to be discharged to:: Skilled nursing facility Living Arrangements: Children Available Help at Discharge: Family;Available 24 hours/day Type of Home: House Home Access: Stairs to enter Entrance Stairs-Rails: Right Entrance Stairs-Number of Steps: 5 Home Layout: One level Home Equipment: Walker - 4 wheels Additional Comments: Children and grandchildren provides 24/7 assist    Prior Function Level of Independence: Needs assistance   Gait / Transfers Assistance Needed: Reports she does not consistently use DME for ambulation, but someone "always  helps"; assist on stairs  ADL's / Homemaking Assistance Needed: Daughter or granddaughter assist with dressing, bathing (pt reports she stands in  shower), household tasks and food prep  Comments: Wears 2L O2 at night and as needed during day     Hand Dominance        Extremity/Trunk Assessment   Upper Extremity Assessment Upper Extremity Assessment: Generalized weakness    Lower Extremity Assessment Lower Extremity Assessment: Generalized weakness       Communication      Cognition Arousal/Alertness: Awake/alert Behavior During Therapy: WFL for tasks assessed/performed Overall Cognitive Status: Within Functional Limits for tasks assessed                                 General Comments: WFL for simple tasks, not formally assessed      General Comments General comments (skin integrity, edema, etc.): SpO2 88-95% on RA, HR 89    Exercises     Assessment/Plan    PT Assessment Patient needs continued PT services  PT Problem List Decreased strength;Decreased activity tolerance;Decreased balance;Decreased mobility;Cardiopulmonary status limiting activity       PT Treatment Interventions DME instruction;Gait training;Stair training;Functional mobility training;Therapeutic activities;Therapeutic exercise;Balance training;Patient/family education    PT Goals (Current goals can be found in the Care Plan section)  Acute Rehab PT Goals Patient Stated Goal: "Now that I think about it, my daughter doesn't need to be helping this much at home... I need to be able to do more for myself" PT Goal Formulation: With patient Time For Goal Achievement: 01/06/20 Potential to Achieve Goals: Good    Frequency Min 2X/week   Barriers to discharge        Co-evaluation               AM-PAC PT "6 Clicks" Mobility  Outcome Measure Help needed turning from your back to your side while in a flat bed without using bedrails?: A Little Help needed moving from lying on your back to sitting on the side of a flat bed without using bedrails?: A Little Help needed moving to and from a bed to a chair (including a  wheelchair)?: A Lot Help needed standing up from a chair using your arms (e.g., wheelchair or bedside chair)?: A Lot Help needed to walk in hospital room?: A Little Help needed climbing 3-5 steps with a railing? : A Lot 6 Click Score: 15    End of Session Equipment Utilized During Treatment: Gait belt Activity Tolerance: Patient tolerated treatment well Patient left: in chair;with call bell/phone within reach;with chair alarm set Nurse Communication: Mobility status PT Visit Diagnosis: Other abnormalities of gait and mobility (R26.89);Muscle weakness (generalized) (M62.81)    Time: 0630-1601 PT Time Calculation (min) (ACUTE ONLY): 24 min   Charges:   PT Evaluation $PT Eval Moderate Complexity: 1 Mod PT Treatments $Therapeutic Activity: 8-22 mins   Mabeline Caras, PT, DPT Acute Rehabilitation Services  Pager [phone] Office [phone]  Derry Lory 12/23/2019, 12:40 PM

## 2019-12-23 NOTE — Telephone Encounter (Signed)
Spoke with pt's daughter, Pattim (on dpr), relaying Dr. Timoteo Expose message.  Verbalizes understanding.    Also, she mentioned pt was admitted to [facility] yesterday due to heart issues.  EMS was called.  FYI to Dr. Reece Agar.

## 2019-12-23 NOTE — Progress Notes (Signed)
Pt. Daughter came in and stated the pt. Is having a hard time eating due to being out of breath when trying to eat therefore not eating her meals. Pt is normally on 2L [place] at home with no problem. Daughter was wondering if 02 should be increased while eating daughter also said when trying to sit up pt. Is in extreme pain/ spasms and was concerned pt has possible nerve pain in her back.    Sheran Spine RN

## 2019-12-23 NOTE — Progress Notes (Signed)
ANTICOAGULATION CONSULT NOTE  Pharmacy Consult for Heparin Indication: pulmonary embolus  Allergies  Allergen Reactions  . Doxycycline Nausea Only and Other (See Comments)    Sig GI upset  . Fluticasone-Salmeterol Anxiety    Patient Measurements: Height: 4\' 11"  (149.9 cm) Weight: 165 lb (74.8 kg) IBW/kg (Calculated) : 43.2 Heparin Dosing Weight: 60.3 kg  Vital Signs: Temp: 98.4 F (36.9 C) (03/28 2124) Temp Source: Oral (03/28 2124) BP: 98/48 (03/28 2124) Pulse Rate: 80 (03/28 2124)  Labs: Recent Labs    12/22/19 1243 12/22/19 1529 12/23/19 0001  HGB 12.1  --  10.0*  HCT 39.4  --  33.0*  PLT 154  --  132*  HEPARINUNFRC  --   --  0.93*  CREATININE 1.55*  --   --   CKTOTAL 75  --   --   TROPONINIHS 403* 392*  --     Estimated Creatinine Clearance: 28.5 mL/min (A) (by C-G formula based on SCr of 1.55 mg/dL (H)).   Medical History: Past Medical History:  Diagnosis Date  . Aneurysm (HCC)   . Anxiety   . Anxiety and depression   . Carotid stenosis    R 50% (12/2012)  . Concussion 08/03/2015  . COPD (chronic obstructive pulmonary disease) (HCC) 12/2012   spirometry: Pre: FVC 84%, FEV1 69%, ratio 0.64 consistent with moderate obstruction.  . Depression   . Fall 08/03/2015   d/c home health 08/2015  . Fracture of cervical vertebra, C5 (HCC) 08/06/2015  . History of chicken pox   . Hyperlipidemia   . Hypertension   . Lower back pain    h/o HNP s/p surgery  . Osteoarthritis    h/o ruptured disc s/p ESI  . Osteoporosis 11/2010   DEXA -2.7 spine, thoracic compression fracture  . Peripheral vascular disease (HCC)   . Smoker    quit 10/2012  . Stroke [facility]) 2010   x3 with residual R hemiparesis, s/p R MCA balloon angioplasty (2010)    Medications:  Scheduled:    Assessment: Patient is a 21 yof that presents to the ED with c/o weakness. The patient has a slight Trop bump and an elevated D.Dimer. Pharmacy has been asked to dose heparin at this time for PE.    Initial heparin level 0.93  Goal of Therapy:  Heparin level 0.3-0.7 units/ml Monitor platelets by anticoagulation protocol: Yes   Plan:  Decrease heparin to 950 units / hr 6 hour heparin level  Thank you 65, PharmD  12/23/2019,1:04 AM

## 2019-12-23 NOTE — Progress Notes (Signed)
PROGRESS NOTE    [name] [name]  JXB:[id number] DOB: 1946/04/18 DOA: 12/22/2019 PCP: Ria Bush, MD   Brief Narrative: 74 year old female with COPD, hypertension, osteoarthritis/osteoporosis, anxiety/depression history of CVA/carotid stenosis residual right-sided weakness, CHF diastolic, mild aortic stenosis, former smoker, PVD/HLD, spinal compression fractures, presented with generalized weakness muscle spasm after she got her second Covid vaccine on March 23  in ED: BP 120/95 pulse 103 respirate of 18 oxygen sats 92% on room air.Creatinine is 1.55.  BNP 136.  CK 75 initial troponin 403-> then 392. Chest x-ray showed no acute finding.  covid 19 screen is negative.  Urinalysis showed moderate leukocytes WBC 11-20.  Urine cultures have been obtained, cardiology consulted and patient being admitted for treatment Patient was placed on heparin drip, serial troponins was downtrending.  Echocardiogram ordered.  Subjective: This morning complaining of a spasm pain on bilateral chest on the side Overnight afebrile T-max 99.8, blood pressure 90s to 120s. Trop 403, down to 392. Reports she is on oxygen at home at 2 L.   Assessment & Plan:  Elevated troponin/suspected myocarditis: Recent vaccination. Patient with coronary disease, cardiology on board and is on Heparin infusion- cardio to address further. Follow-up echocardiogram, troponin 403->392->208.  Chronic diastolic CHF: Monitor fluid status. Continue home Lasix.  Cardiology ordering IV Lasix x1.  Hypertension: Soft to low. Hold Benzapril.  History of CVA with residual right-sided weakness/carotid stenosis/peripheral vascular disease/HLD: Continue home aspirin, Plavix, statin and Zetia.  AKI On CKD stage IIIa-previous creatinine around 1.0 in February/22/21.Creatinine 1.3 from 1.5. Hold home Benzapril. On home Lasix.  Anemia likely from chronic disease hemoglobin 10 g. Monitor.  Pyuria follow-up culture.  Patient is on chronic  trimethoprim and will be continued  COPD: Not in exacerbation. Non tolerant to Advair, continue as needed albuterol..  Anxiety/depression: Continue Zoloft, trazodone  Hypothyroidism: resume home Synthroid.  Generalized weakness/debility in the setting of #1 PT OT evaluation. stable TSH level.  Nutrition: Diet Order            Diet Heart Room service appropriate? Yes; Fluid consistency: Thin  Diet effective now              Body mass index is 33.33 kg/m.   DVT prophylaxis:Heparin Code Status: Full code Family Communication: plan of care discussed with patient at bedside. Disposition Plan: Patient is from: Home Anticipated Disposition: to skilled nursing facility. Barriers to discharge or conditions that needs to be met prior to discharge: Remains hospitalized for elevated troponins on heparin drip, and this patient once medically stable hopefully next 24 to 48 hours once okay with cardiology, PT discharge to skilled nursing facility, [facility] consulted.   Consultants: Cardiology Procedures:see note Microbiology:see note   Medications: Scheduled Meds:  aspirin EC  81 mg Oral Daily   atorvastatin  40 mg Oral Daily   cholecalciferol  5,000 Units Oral Daily   clopidogrel  75 mg Oral Daily   ezetimibe  10 mg Oral Daily   furosemide  20 mg Oral Daily   gabapentin  100 mg Oral QHS   levothyroxine  50 mcg Oral Q0600   multivitamin with minerals   Oral Daily   pantoprazole  40 mg Oral QODAY   potassium chloride  20 mEq Oral Daily   sertraline  100 mg Oral Daily   traZODone  150 mg Oral QHS   trimethoprim  100 mg Oral Daily   Continuous Infusions:  sodium chloride 100 mL/hr at 12/23/19 0804   heparin 850 Units/hr (12/23/19 0824)  Antimicrobials: Anti-infectives (From admission, onward)   Start     Dose/Rate Route Frequency Ordered Stop   12/23/19 1000  trimethoprim (TRIMPEX) tablet 100 mg    Note to Pharmacy: Take daily to prevent UTI     100 mg Oral  Daily 12/23/19 0806     12/22/19 1345  cefTRIAXone (ROCEPHIN) 1 g in sodium chloride 0.9 % 100 mL IVPB     1 g 200 mL/hr over 30 Minutes Intravenous  Once 12/22/19 1331 12/22/19 1545       Objective: Vitals: Today's Vitals   12/22/19 1930 12/22/19 2124 12/23/19 0438 12/23/19 0806  BP: (!) 121/95 (!) 98/48 (!) 105/47 (!) 116/52  Pulse: 88 80 79 77  Resp: '13 15 19 14  '$ Temp:  98.4 F (36.9 C) 97.6 F (36.4 C) (!) 97.5 F (36.4 C)  TempSrc:  Oral Oral Oral  SpO2: 100% 97% 99% 98%  Weight:      Height:      PainSc:  8       Intake/Output Summary (Last 24 hours) at 12/23/2019 1127 Last data filed at 12/23/2019 0432 Gross per 24 hour  Intake 1807.59 ml  Output --  Net 1807.59 ml   Filed Weights   12/22/19 1213  Weight: 74.8 kg   Weight change:    Intake/Output from previous day: 03/28 0701 - 03/29 0700 In: 1807.6 [I.V.:707.6; IV Piggyback:1100] Out: -  Intake/Output this shift: No intake/output data recorded.  Examination:  General exam: AAO ,NAD, weak appearing. HEENT:Oral mucosa moist, Ear/Nose WNL grossly,dentition normal. Respiratory system: bilaterally clear,no wheezing or crackles,no use of accessory muscle, non tender. Cardiovascular system: S1 & S2 +, regular, No JVD. Gastrointestinal system: Abdomen soft, NT,ND, BS+. Nervous System:Alert, awake, moving extremities and grossly nonfocal Extremities: No edema, distal peripheral pulses palpable.  Skin: No rashes,no icterus. MSK: Normal muscle bulk,tone, power  Data Reviewed: I have personally reviewed following labs and imaging studies CBC: Recent Labs  Lab 12/22/19 1243 12/23/19 0001  WBC 9.3 6.9  NEUTROABS 7.5  --   HGB 12.1 10.0*  HCT 39.4 33.0*  MCV 96.8 97.6  PLT 154 161*   Basic Metabolic Panel: Recent Labs  Lab 12/22/19 1243 12/23/19 0001  NA 136 140  K 4.7 4.6  CL 97* 102  CO2 28 31  GLUCOSE 119* 109*  BUN 21 16  CREATININE 1.55* 1.39*  CALCIUM 9.4 8.4*   GFR: Estimated  Creatinine Clearance: 31.8 mL/min (A) (by C-G formula based on SCr of 1.39 mg/dL (H)). Liver Function Tests: Recent Labs  Lab 12/22/19 1243 12/23/19 0001  AST 33 25  ALT 20 18  ALKPHOS 92 73  BILITOT 0.6 0.6  PROT 7.2 6.0*  ALBUMIN 3.6 2.9*   Recent Labs  Lab 12/22/19 1243  LIPASE 31   No results for input(s): AMMONIA in the last 168 hours. Coagulation Profile: No results for input(s): INR, PROTIME in the last 168 hours. Cardiac Enzymes: Recent Labs  Lab 12/22/19 1243  CKTOTAL 75   BNP (last 3 results) Recent Labs    11/05/19 1438  PROBNP 32.0   HbA1C: No results for input(s): HGBA1C in the last 72 hours. CBG: Recent Labs  Lab 12/22/19 1228  GLUCAP 98   Lipid Profile: No results for input(s): CHOL, HDL, LDLCALC, TRIG, CHOLHDL, LDLDIRECT in the last 72 hours. Thyroid Function Tests: Recent Labs    12/22/19 1243  TSH 2.660   Anemia Panel: No results for input(s): VITAMINB12, FOLATE, FERRITIN, TIBC, IRON, RETICCTPCT in the  last 72 hours. Sepsis Labs: No results for input(s): PROCALCITON, LATICACIDVEN in the last 168 hours.  Recent Results (from the past 240 hour(s))  Urine culture     Status: Abnormal (Preliminary result)   Collection Time: 12/22/19  1:15 PM   Specimen: Urine, Clean Catch  Result Value Ref Range Status   Specimen Description URINE, CLEAN CATCH  Final   Special Requests NONE  Final   Culture (A)  Final    40,000 COLONIES/mL ENTEROCOCCUS FAECALIS SUSCEPTIBILITIES TO FOLLOW Performed at [facility], [place] [address], [place], [place] [address]    Report Status PENDING  Incomplete  SARS CORONAVIRUS 2 (TAT 6-24 HRS) Nasopharyngeal Nasopharyngeal Swab     Status: None   Collection Time: 12/22/19  3:15 PM   Specimen: Nasopharyngeal Swab  Result Value Ref Range Status   SARS Coronavirus 2 NEGATIVE NEGATIVE Final    Comment: (NOTE) SARS-CoV-2 target nucleic acids are NOT DETECTED. The SARS-CoV-2 RNA is generally detectable in upper  and lower respiratory specimens during the acute phase of infection. Negative results do not preclude SARS-CoV-2 infection, do not rule out co-infections with other pathogens, and should not be used as the sole basis for treatment or other patient management decisions. Negative results must be combined with clinical observations, patient history, and epidemiological information. The expected result is Negative. Fact Sheet for Patients: SugarRoll.be Fact Sheet for Healthcare Providers: https://www.woods-mathews.com/ This test is not yet approved or cleared by the [place] FDA and  has been authorized for detection and/or diagnosis of SARS-CoV-2 by FDA under an Emergency Use Authorization (EUA). This EUA will remain  in effect (meaning this test can be used) for the duration of the COVID-19 declaration under Section 56 4(b)(1) of the Act, 21 U.S.C. section 360bbb-3(b)(1), unless the authorization is terminated or revoked sooner. Performed at [facility], [place] [address] [place], [place] [address]       Radiology Studies: CT Head Wo Contrast  Result Date: 12/22/2019 CLINICAL DATA:  Altered mental status EXAM: CT HEAD WITHOUT CONTRAST TECHNIQUE: Contiguous axial images were obtained from the base of the skull through the vertex without intravenous contrast. COMPARISON:  CT head dated 01/24/2019 FINDINGS: Brain: No evidence of acute infarction, hemorrhage, hydrocephalus, extra-axial collection or mass lesion/mass effect. There is moderate cerebral volume loss with associated ex vacuo dilatation. Vascular: There are vascular calcifications in the carotid siphons. Skull: Normal. Negative for fracture or focal lesion. Sinuses/Orbits: No acute finding. Other: None. IMPRESSION: No acute intracranial process. Electronically Signed   By: Zerita Boers M.D.   On: 12/22/2019 14:16   CT Angio Chest PE W/Cm &/Or Wo Cm  Result Date:  12/22/2019 CLINICAL DATA:  Weakness and chest pain. EXAM: CT ANGIOGRAPHY CHEST WITH CONTRAST TECHNIQUE: Multidetector CT imaging of the chest was performed using the standard protocol during bolus administration of intravenous contrast. Multiplanar CT image reconstructions and MIPs were obtained to evaluate the vascular anatomy. CONTRAST:  45m OMNIPAQUE IOHEXOL 350 MG/ML SOLN COMPARISON:  11/14/2017 FINDINGS: Cardiovascular: Contrast injection is sufficient to demonstrate satisfactory opacification of the pulmonary arteries to the segmental level. There is no pulmonary embolus. The main pulmonary artery is within normal limits for size. There is no CT evidence of acute right heart strain. There are atherosclerotic changes of the thoracic aorta without evidence for aneurysm. Heart size is enlarged. There are coronary artery calcifications without evidence for pericardial effusion. Mediastinum/Nodes: --No mediastinal or hilar lymphadenopathy. --No axillary lymphadenopathy. --No supraclavicular lymphadenopathy. --Normal thyroid gland. --  there is some mild diffuse esophageal wall thickening. Lungs/Pleura: There are emphysematous changes bilaterally. There is atelectasis at the lung bases. There is debris within the trachea. There is some bronchial wall thickening and mucus plugging at the lung bases. There is a small right-sided pleural effusion. Upper Abdomen: No acute abnormality. Musculoskeletal: There is a slight irregularity of the T10 superior endplate, new since 2019. there is approximately 15% height loss anteriorly. There is no retropulsion. Review of the MIP images confirms the above findings. IMPRESSION: 1. No acute pulmonary embolism. 2. Findings suspicious for an acute or subacute mild compression fracture of the T10 vertebral body. 3. Bibasilar atelectasis with a trace right-sided pleural effusion. 4. Mild diffuse esophageal wall thickening is nonspecific but can be seen in patients with esophagitis. 5.  Mild mucous plugging and bronchial wall thickening at the lung bases suggestive of reactive or infectious bronchiolitis. Aortic Atherosclerosis (ICD10-I70.0) and Emphysema (ICD10-J43.9). Electronically Signed   By: Constance Holster M.D.   On: 12/22/2019 17:14   DG Chest Port 1 View  Result Date: 12/22/2019 CLINICAL DATA:  Cough and body aches. EXAM: PORTABLE CHEST 1 VIEW COMPARISON:  January 24, 2019 FINDINGS: The mediastinal contour and cardiac silhouette are stable. There is no focal infiltrate, pulmonary edema, or pleural effusion. No acute abnormality is identified in the osseous structures. IMPRESSION: No active disease. Electronically Signed   By: Abelardo Diesel M.D.   On: 12/22/2019 12:45     LOS: 1 day   Time spent: More than 50% of that time was spent in counseling and/or coordination of care.  Antonieta Pert, MD [facility]  12/23/2019, 11:27 AM

## 2019-12-23 NOTE — Progress Notes (Signed)
Progress Note  Patient Name: [name] [name] Date of Encounter: 12/23/2019  Primary Cardiologist: New  Subjective     Inpatient Medications    Scheduled Meds: . aspirin EC  81 mg Oral Daily  . atorvastatin  40 mg Oral Daily  . cholecalciferol  5,000 Units Oral Daily  . clopidogrel  75 mg Oral Daily  . ezetimibe  10 mg Oral Daily  . furosemide  20 mg Oral Daily  . gabapentin  100 mg Oral QHS  . levothyroxine  50 mcg Oral Q0600  . multivitamin with minerals   Oral Daily  . pantoprazole  40 mg Oral QODAY  . potassium chloride  20 mEq Oral Daily  . sertraline  100 mg Oral Daily  . traZODone  150 mg Oral QHS  . trimethoprim  100 mg Oral Daily   Continuous Infusions: . sodium chloride 100 mL/hr at 12/23/19 0804  . heparin 850 Units/hr (12/23/19 0824)   PRN Meds: acetaminophen **OR** acetaminophen, albuterol, ondansetron **OR** ondansetron (ZOFRAN) IV, traMADol   Vital Signs    Vitals:   12/22/19 1930 12/22/19 2124 12/23/19 0438 12/23/19 0806  BP: (!) 121/95 (!) 98/48 (!) 105/47 (!) 116/52  Pulse: 88 80 79 77  Resp: 13 15 19 14   Temp:  98.4 F (36.9 C) 97.6 F (36.4 C) (!) 97.5 F (36.4 C)  TempSrc:  Oral Oral Oral  SpO2: 100% 97% 99% 98%  Weight:      Height:        Intake/Output Summary (Last 24 hours) at 12/23/2019 1036 Last data filed at 12/23/2019 0432 Gross per 24 hour  Intake 1807.59 ml  Output --  Net 1807.59 ml   Last 3 Weights 12/22/2019 12/20/2019 11/25/2019  Weight (lbs) 165 lb (No Data) 186 lb  Weight (kg) 74.844 kg (No Data) 84.369 kg      Telemetry    SR  - Personally Reviewed  ECG    Not done   Personally Reviewed  Physical Exam   GEN: No acute distress.   Neck: JVP is increased    Cardiac: RRR, no murmurs, rubs, or gallops.  Respiratory: Clear to auscultation bilaterally. GI: Soft, nontender, non-distended  MS: 1+ LE edema Neuro:  Nonfocal  Psych: Normal affect   Labs    High Sensitivity Troponin:   Recent Labs  Lab  12/22/19 1243 12/22/19 1529 12/23/19 0838  TROPONINIHS 403* 392* 208*      Chemistry Recent Labs  Lab 12/22/19 1243 12/23/19 0001  NA 136 140  K 4.7 4.6  CL 97* 102  CO2 28 31  GLUCOSE 119* 109*  BUN 21 16  CREATININE 1.55* 1.39*  CALCIUM 9.4 8.4*  PROT 7.2 6.0*  ALBUMIN 3.6 2.9*  AST 33 25  ALT 20 18  ALKPHOS 92 73  BILITOT 0.6 0.6  GFRNONAA 33* 38*  GFRAA 38* 43*  ANIONGAP 11 7     Hematology Recent Labs  Lab 12/22/19 1243 12/23/19 0001  WBC 9.3 6.9  RBC 4.07 3.38*  HGB 12.1 10.0*  HCT 39.4 33.0*  MCV 96.8 97.6  MCH 29.7 29.6  MCHC 30.7 30.3  RDW 13.2 13.1  PLT 154 132*    BNP Recent Labs  Lab 12/22/19 1243  BNP 136.0*     DDimer  Recent Labs  Lab 12/22/19 1243  DDIMER 3.52*     Radiology    CT Head Wo Contrast  Result Date: 12/22/2019 CLINICAL DATA:  Altered mental status EXAM: CT HEAD WITHOUT CONTRAST  TECHNIQUE: Contiguous axial images were obtained from the base of the skull through the vertex without intravenous contrast. COMPARISON:  CT head dated 01/24/2019 FINDINGS: Brain: No evidence of acute infarction, hemorrhage, hydrocephalus, extra-axial collection or mass lesion/mass effect. There is moderate cerebral volume loss with associated ex vacuo dilatation. Vascular: There are vascular calcifications in the carotid siphons. Skull: Normal. Negative for fracture or focal lesion. Sinuses/Orbits: No acute finding. Other: None. IMPRESSION: No acute intracranial process. Electronically Signed   By: Zerita Boers M.D.   On: 12/22/2019 14:16   CT Angio Chest PE W/Cm &/Or Wo Cm  Result Date: 12/22/2019 CLINICAL DATA:  Weakness and chest pain. EXAM: CT ANGIOGRAPHY CHEST WITH CONTRAST TECHNIQUE: Multidetector CT imaging of the chest was performed using the standard protocol during bolus administration of intravenous contrast. Multiplanar CT image reconstructions and MIPs were obtained to evaluate the vascular anatomy. CONTRAST:  97mL OMNIPAQUE IOHEXOL  350 MG/ML SOLN COMPARISON:  11/14/2017 FINDINGS: Cardiovascular: Contrast injection is sufficient to demonstrate satisfactory opacification of the pulmonary arteries to the segmental level. There is no pulmonary embolus. The main pulmonary artery is within normal limits for size. There is no CT evidence of acute right heart strain. There are atherosclerotic changes of the thoracic aorta without evidence for aneurysm. Heart size is enlarged. There are coronary artery calcifications without evidence for pericardial effusion. Mediastinum/Nodes: --No mediastinal or hilar lymphadenopathy. --No axillary lymphadenopathy. --No supraclavicular lymphadenopathy. --Normal thyroid gland. --there is some mild diffuse esophageal wall thickening. Lungs/Pleura: There are emphysematous changes bilaterally. There is atelectasis at the lung bases. There is debris within the trachea. There is some bronchial wall thickening and mucus plugging at the lung bases. There is a small right-sided pleural effusion. Upper Abdomen: No acute abnormality. Musculoskeletal: There is a slight irregularity of the T10 superior endplate, new since 2019. there is approximately 15% height loss anteriorly. There is no retropulsion. Review of the MIP images confirms the above findings. IMPRESSION: 1. No acute pulmonary embolism. 2. Findings suspicious for an acute or subacute mild compression fracture of the T10 vertebral body. 3. Bibasilar atelectasis with a trace right-sided pleural effusion. 4. Mild diffuse esophageal wall thickening is nonspecific but can be seen in patients with esophagitis. 5. Mild mucous plugging and bronchial wall thickening at the lung bases suggestive of reactive or infectious bronchiolitis. Aortic Atherosclerosis (ICD10-I70.0) and Emphysema (ICD10-J43.9). Electronically Signed   By: Constance Holster M.D.   On: 12/22/2019 17:14   DG Chest Port 1 View  Result Date: 12/22/2019 CLINICAL DATA:  Cough and body aches. EXAM: PORTABLE  CHEST 1 VIEW COMPARISON:  January 24, 2019 FINDINGS: The mediastinal contour and cardiac silhouette are stable. There is no focal infiltrate, pulmonary edema, or pleural effusion. No acute abnormality is identified in the osseous structures. IMPRESSION: No active disease. Electronically Signed   By: Abelardo Diesel M.D.   On: 12/22/2019 12:45    Cardiac Studies     Patient Profile     74 y.o. female hx of CVA, diastolic CHF, COPD, mild AS who presents with weakness    Assessment & Plan      1   Elevated troponin Very mild elevation of trop.   Fairly flat     She is without CP    Echo is ordered/pending Keep on current regimen   2   Chronic diastolic CHF  Volume does appear t obe up some   Cr 1.39    Has been higher  I would give 1 dose of IV  lasi and follow I/O  3  Hx CVA   On ASA / plavix     Please consult [email or web address] for contact info under        Signed, Dietrich Pates, MD  12/23/2019, 10:36 AM

## 2019-12-23 NOTE — Progress Notes (Addendum)
ANTICOAGULATION CONSULT NOTE  Pharmacy Consult for Heparin Indication: pulmonary embolus/elevated troponins  Allergies  Allergen Reactions  . Doxycycline Nausea Only and Other (See Comments)    Sig GI upset  . Fluticasone-Salmeterol Anxiety    Patient Measurements: Height: 4\' 11"  (149.9 cm) Weight: 165 lb (74.8 kg) IBW/kg (Calculated) : 43.2 Heparin Dosing Weight: 60.3 kg  Vital Signs: Temp: 98.1 F (36.7 C) (03/29 1655) Temp Source: Oral (03/29 1655) BP: 111/64 (03/29 1655) Pulse Rate: 92 (03/29 1655)  Labs: Recent Labs    12/22/19 1243 12/22/19 1529 12/23/19 0001 12/23/19 0724 12/23/19 0838 12/23/19 1611  HGB 12.1  --  10.0*  --   --   --   HCT 39.4  --  33.0*  --   --   --   PLT 154  --  132*  --   --   --   HEPARINUNFRC  --   --  0.93* 0.73*  --  0.73*  CREATININE 1.55*  --  1.39*  --   --   --   CKTOTAL 75  --   --   --   --   --   TROPONINIHS 403* 392*  --   --  208*  --     Estimated Creatinine Clearance: 31.8 mL/min (A) (by C-G formula based on SCr of 1.39 mg/dL (H)).   Medical History: Past Medical History:  Diagnosis Date  . Aneurysm (HCC)   . Anxiety   . Anxiety and depression   . Carotid stenosis    R 50% (12/2012)  . Concussion 08/03/2015  . COPD (chronic obstructive pulmonary disease) (HCC) 12/2012   spirometry: Pre: FVC 84%, FEV1 69%, ratio 0.64 consistent with moderate obstruction.  . Depression   . Fall 08/03/2015   d/c home health 08/2015  . Fracture of cervical vertebra, C5 (HCC) 08/06/2015  . History of chicken pox   . Hyperlipidemia   . Hypertension   . Lower back pain    h/o HNP s/p surgery  . Osteoarthritis    h/o ruptured disc s/p ESI  . Osteoporosis 11/2010   DEXA -2.7 spine, thoracic compression fracture  . Peripheral vascular disease (HCC)   . Smoker    quit 10/2012  . Stroke [facility]) 2010   x3 with residual R hemiparesis, s/p R MCA balloon angioplasty (2010)   Assessment: Patient is a 74 yr old female who presented to the  ED with  weakness. The patient had a troponin bump 65), now attributed to suspected myocarditis, and an elevated d-dimer. Pharmacy was consulted to dose heparin for PE; CTA was negative. Pt continues on IV heparin for elevated troponin; Cardiology has been consulted.  Heparin level drawn ~7.5 hrs after heparin infusion was decreased to 850 units/hr was 0.73 units/ml, which is slightly above the goal range for this pt. H/H 10.0/33.0, platelets 132 (CBC trending down). Per RN, no issues with IV or bleeding observed.   Goal of Therapy:  Heparin level 0.3-0.7 units/ml Monitor platelets by anticoagulation protocol: Yes   Plan:  Decrease heparin infusion to 750 units/hr Check 8-hr heparin level Monitor daily heparin level, CBC Monitor for signs/symptoms of bleeding  03-07-1988, PharmD, BCPS, [facility] Clinical Pharmacist 12/23/2019,5:02 PM

## 2019-12-24 ENCOUNTER — Inpatient Hospital Stay (HOSPITAL_COMMUNITY): Payer: Medicare HMO

## 2019-12-24 DIAGNOSIS — I10 Essential (primary) hypertension: Secondary | ICD-10-CM

## 2019-12-24 LAB — BASIC METABOLIC PANEL
Anion gap: 12 (ref 5–15)
BUN: 13 mg/dL (ref 8–23)
CO2: 25 mmol/L (ref 22–32)
Calcium: 8 mg/dL — ABNORMAL LOW (ref 8.9–10.3)
Chloride: 102 mmol/L (ref 98–111)
Creatinine, Ser: 1.22 mg/dL — ABNORMAL HIGH (ref 0.44–1.00)
GFR calc Af Amer: 51 mL/min — ABNORMAL LOW (ref >60)
GFR calc non Af Amer: 44 mL/min — ABNORMAL LOW (ref >60)
Glucose, Bld: 100 mg/dL — ABNORMAL HIGH (ref 70–99)
Potassium: 4.1 mmol/L (ref 3.5–5.1)
Sodium: 139 mmol/L (ref 135–145)

## 2019-12-24 LAB — CBC
HCT: 32.9 % — ABNORMAL LOW (ref 36.0–46.0)
Hemoglobin: 10.1 g/dL — ABNORMAL LOW (ref 12.0–15.0)
MCH: 30 pg (ref 26.0–34.0)
MCHC: 30.7 g/dL (ref 30.0–36.0)
MCV: 97.6 fL (ref 80.0–100.0)
Platelets: 138 10*3/uL — ABNORMAL LOW (ref 150–400)
RBC: 3.37 MIL/uL — ABNORMAL LOW (ref 3.87–5.11)
RDW: 13.2 % (ref 11.5–15.5)
WBC: 6.9 10*3/uL (ref 4.0–10.5)
nRBC: 0 % (ref 0.0–0.2)

## 2019-12-24 LAB — URINE CULTURE: Culture: 40000 — AB

## 2019-12-24 LAB — HEPARIN LEVEL (UNFRACTIONATED): Heparin Unfractionated: 0.52 IU/mL (ref 0.30–0.70)

## 2019-12-24 LAB — BRAIN NATRIURETIC PEPTIDE: B Natriuretic Peptide: 71.7 pg/mL (ref 0.0–100.0)

## 2019-12-24 MED ORDER — HYDROCODONE-ACETAMINOPHEN 5-325 MG PO TABS
1.0000 | ORAL_TABLET | Freq: Four times a day (QID) | ORAL | Status: DC | PRN
  Administered 2019-12-24 – 2019-12-25 (×4): 1 via ORAL
  Filled 2019-12-24 (×4): qty 1

## 2019-12-24 MED ORDER — CYCLOBENZAPRINE HCL 10 MG PO TABS
5.0000 mg | ORAL_TABLET | Freq: Once | ORAL | Status: AC
  Administered 2019-12-24: 5 mg via ORAL
  Filled 2019-12-24: qty 1

## 2019-12-24 MED ORDER — METHOCARBAMOL 500 MG PO TABS
500.0000 mg | ORAL_TABLET | Freq: Three times a day (TID) | ORAL | Status: DC | PRN
  Administered 2019-12-24 – 2019-12-26 (×3): 500 mg via ORAL
  Filled 2019-12-24 (×3): qty 1

## 2019-12-24 NOTE — Progress Notes (Signed)
Physical Therapy Treatment Patient Details Name: [name] [name] MRN: [id number] DOB: [birth year] Today's Date: 12/24/2019    History of Present Illness Pt is a 74 y.o. female admitted 12/22/19 with SOB, fatigue, weakness and muscle spasms since getting her second dose of COVID-19 vaccine on 3/23. Pt with transient elevation of troponin; suspected myocarditis, but less likely; could be reaction to recent vaccination. PMH includes COPD, HTN, stroke, PVD, osteoporosis, depression, cardotid stenosis.    PT Comments    Patient progressing well towards PT goals. Reports feeling better and stronger today. Only requires light Min A initially to stand from all surfaces. Improved ambulation distance with use of RW. Sp02 not the best wave form, likely 88-93% on RA. Worked on LE strengthening through sit to stands; improving with cues for technique. Discharge recommendation updated to home with HHPT as pt will have support from her daughter at home. Will follow.   Follow Up Recommendations  Home health PT;Supervision for mobility/OOB     Equipment Recommendations  None recommended by PT    Recommendations for Other Services       Precautions / Restrictions Precautions Precautions: Fall;Other (comment) Precaution Comments: Urine incontinence Restrictions Weight Bearing Restrictions: No    Mobility  Bed Mobility Overal bed mobility: Needs Assistance Bed Mobility: Sit to Supine       Sit to supine: Supervision;HOB elevated   General bed mobility comments: Able to bring LES into bed to return to supine.  Transfers Overall transfer level: Needs assistance Equipment used: Rolling walker (2 wheeled) Transfers: Sit to/from Stand Sit to Stand: Min assist         General transfer comment: ASsist to power to standing with cues for hand placement/technique. Stood from chair x1, from EOB x4.  Ambulation/Gait Ambulation/Gait assistance: Min guard Gait Distance (Feet): 30 Feet Assistive  device: Rolling walker (2 wheeled) Gait Pattern/deviations: Step-through pattern;Decreased stride length;Wide base of support;Trunk flexed Gait velocity: Decreased   General Gait Details: Slow, mildly unsteady gait with RW; cues to increase step length as pt initially shuffling. SpO2 88-90% on RA. 2/4 DOE noted.   Stairs             Wheelchair Mobility    Modified Rankin (Stroke Patients Only)       Balance Overall balance assessment: Needs assistance Sitting-balance support: Feet supported;No upper extremity supported Sitting balance-Leahy Scale: Fair     Standing balance support: During functional activity Standing balance-Leahy Scale: Poor Standing balance comment: Requires UE support in standing.                            Cognition Arousal/Alertness: Awake/alert Behavior During Therapy: WFL for tasks assessed/performed Overall Cognitive Status: Within Functional Limits for tasks assessed                                 General Comments: A/O x4      Exercises Other Exercises Other Exercises: Sit to stand x4 from EOB with emphasize on eccentric control    General Comments General comments (skin integrity, edema, etc.): Not the best wave form to measure Sp02 but likely high 80s-90s on RA.      Pertinent Vitals/Pain Pain Assessment: Faces Faces Pain Scale: No hurt    Home Living                      Prior Function  PT Goals (current goals can now be found in the care plan section) Progress towards PT goals: Progressing toward goals    Frequency    Min 3X/week      PT Plan Discharge plan needs to be updated;Frequency needs to be updated    Co-evaluation              AM-PAC PT "6 Clicks" Mobility   Outcome Measure  Help needed turning from your back to your side while in a flat bed without using bedrails?: A Little Help needed moving from lying on your back to sitting on the side of a flat  bed without using bedrails?: None Help needed moving to and from a bed to a chair (including a wheelchair)?: A Little Help needed standing up from a chair using your arms (e.g., wheelchair or bedside chair)?: A Little Help needed to walk in hospital room?: A Little Help needed climbing 3-5 steps with a railing? : A Lot 6 Click Score: 18    End of Session Equipment Utilized During Treatment: Gait belt Activity Tolerance: Patient tolerated treatment well Patient left: in bed;with call bell/phone within reach;with bed alarm set Nurse Communication: Mobility status PT Visit Diagnosis: Other abnormalities of gait and mobility (R26.89);Muscle weakness (generalized) (M62.81)     Time: 0354-6568 PT Time Calculation (min) (ACUTE ONLY): 19 min  Charges:  $Therapeutic Activity: 8-22 mins                     Vale Haven, PT, DPT Acute Rehabilitation Services Pager [phone] Office [phone]       Blake Divine A Lanier Ensign 12/24/2019, 4:03 PM

## 2019-12-24 NOTE — Progress Notes (Signed)
ANTICOAGULATION CONSULT NOTE  Pharmacy Consult for Heparin Indication: pulmonary embolus/elevated troponins  Allergies  Allergen Reactions  . Doxycycline Nausea Only and Other (See Comments)    Sig GI upset  . Fluticasone-Salmeterol Anxiety    Patient Measurements: Height: 4\' 11"  (149.9 cm) Weight: 165 lb (74.8 kg) IBW/kg (Calculated) : 43.2 Heparin Dosing Weight: 60.3 kg  Vital Signs: Temp: 97.6 F (36.4 C) (03/30 0504) Temp Source: Oral (03/30 0504) BP: 102/51 (03/30 0504) Pulse Rate: 77 (03/30 0504)  Labs: Recent Labs    12/22/19 1243 12/22/19 1243 12/22/19 1529 12/23/19 0001 12/23/19 0001 12/23/19 0724 12/23/19 0838 12/23/19 1611 12/24/19 0121  HGB 12.1   < >  --  10.0*  --   --   --   --  10.1*  HCT 39.4  --   --  33.0*  --   --   --   --  32.9*  PLT 154  --   --  132*  --   --   --   --  138*  HEPARINUNFRC  --   --   --  0.93*   < > 0.73*  --  0.73* 0.52  CREATININE 1.55*  --   --  1.39*  --   --   --   --  1.22*  CKTOTAL 75  --   --   --   --   --   --   --   --   TROPONINIHS 403*  --  392*  --   --   --  208*  --   --    < > = values in this interval not displayed.    Estimated Creatinine Clearance: 36.2 mL/min (A) (by C-G formula based on SCr of 1.22 mg/dL (H)).   Medical History: Past Medical History:  Diagnosis Date  . Aneurysm (HCC)   . Anxiety   . Anxiety and depression   . Carotid stenosis    R 50% (12/2012)  . Concussion 08/03/2015  . COPD (chronic obstructive pulmonary disease) (HCC) 12/2012   spirometry: Pre: FVC 84%, FEV1 69%, ratio 0.64 consistent with moderate obstruction.  . Depression   . Fall 08/03/2015   d/c home health 08/2015  . Fracture of cervical vertebra, C5 (HCC) 08/06/2015  . History of chicken pox   . Hyperlipidemia   . Hypertension   . Lower back pain    h/o HNP s/p surgery  . Osteoarthritis    h/o ruptured disc s/p ESI  . Osteoporosis 11/2010   DEXA -2.7 spine, thoracic compression fracture  . Peripheral vascular  disease (HCC)   . Smoker    quit 10/2012  . Stroke [facility]) 2010   x3 with residual R hemiparesis, s/p R MCA balloon angioplasty (2010)   Assessment: Patient is a 74 yr old female who presented to the ED with  weakness. The patient had a troponin bump 65) and Pharmacy was consulted to dose heparin for PE; CTA was negative. Pt continues on IV heparin for elevated troponin; Cardiology has been consulted. -heparin level at goal    Goal of Therapy:  Heparin level 0.3-0.7 units/ml Monitor platelets by anticoagulation protocol: Yes   Plan:  No heparin changes needed Monitor daily heparin level, CBC Will follow plans for length of therapy  ([phone], PharmD Clinical Pharmacist **Pharmacist phone directory can now be found on [email or web address] ([email or web address]).  Listed under [facility].

## 2019-12-24 NOTE — Progress Notes (Signed)
PROGRESS NOTE    [name] [name]  MGQ:[id number] DOB: [birth year] DOA: 12/22/2019 PCP: Ria Bush, MD   Brief Narrative: 74 year old female with COPD, hypertension, osteoarthritis/osteoporosis, anxiety/depression history of CVA/carotid stenosis residual right-sided weakness, CHF diastolic, mild aortic stenosis, former smoker, PVD/HLD, spinal compression fractures, presented with generalized weakness muscle spasm after she got her second Covid vaccine on March 23  in ED: BP 120/95 pulse 103 respirate of 18 oxygen sats 92% on room air.Creatinine is 1.55.  BNP 136.  CK 75 initial troponin 403-> then 392. Chest x-ray showed no acute finding.  covid 19 screen is negative.  Urinalysis showed moderate leukocytes WBC 11-20.  Urine cultures have been obtained, cardiology consulted and patient being admitted for treatment Patient was placed on heparin drip and admitted  Subjective: Seen this morning resting on the bedside chair having her meal.  Complains of ongoing spasm and bilateral chest.  Denies chest pain no shortness of breath.Reports she is on oxygen at home at 2 L.   Assessment & Plan:  Elevated troponin: pt with recent vaccination initially concern for myocarditis- but echo normal, less likley. Patient with coronary disease, cardiology saw the patient and was placed on heparin infusion due to concern for ischemia, echo shows normal EF no regional wall motion abnormalities, heparin being discontinued today.troponin 403->392->208.  Pain atypical complaints of a spasm continue tramadol and Norco if severe, added Robaxin.  Continue to work with PT OT.  Continue home aspirin Plavix statin.  Aortic stenosis mean gradient 40 mmHg consistent with mild AS, follow-up outpatient.  Chronic diastolic CHF: Monitor fluid status. Continue home Lasix.IV Lasix x1 3/29.  Hypertension: Soft to low. Hold Benzapril.  History of CVA with residual right-sided weakness/carotid stenosis/peripheral vascular  disease/HLD: Continue home aspirin, Plavix, statin and Zetia.  AKI On CKD stage IIIa-previous creatinine around 1.0 in February/22/21.Creatinine 1.3 from 1.5. Hold home Benzapril. On home Lasix.  Anemia likely from chronic disease hemoglobin 10 g. Monitor.  Pyuria follow-up culture w 40,00 E Fecalis.But pateint denies any dysuria urinary frequency or any other UTI symptoms.  She is on chronic trimethoprim at home.    COPD: Not in exacerbation. Non tolerant to Advair, continue as needed albuterol..  Anxiety/depression: Continue Zoloft, trazodone  Hypothyroidism: cont home Synthroid.  Generalized weakness/debility in the setting of #1 PT OT evaluation. stable TSH level.  Nutrition: Diet Order            Diet Heart Room service appropriate? Yes; Fluid consistency: Thin; Fluid restriction: 1800 mL Fluid  Diet effective now              Body mass index is 33.33 kg/m.   DVT prophylaxis:Heparin Code Status: Full code Family Communication: plan of care discussed with patient at bedside.  Daughter  [name] [name] updated 3/29, 3/30.  Disposition Plan: Patient is from: Home Anticipated Disposition: to Home with HHC. Declined SNF. [name] [name] reports pt will go with another daughter and the ydo not want SNF-and will need some time to arrange for return home and anticipating discharge tomorrow.  Barriers to discharge or conditions that needs to be met prior to discharge: Admitted with chest pain positive troponin and on heparin initially for concern for ischemia at this time echo normal stopping heparin, patient complains of spasm we will continue on pain management continue PT OT and anticipate discharge home with daughter and home health tomorrow if pain improves.   Consultants: Cardiology Procedures: 1. Left ventricular ejection fraction, by estimation, is 60 to 65%. The  left  ventricle has normal function. The left ventricle has no regional  wall motion abnormalities. There is mild left ventricular  hypertrophy.  Left ventricular diastolic parameters  are indeterminate.  2. Right ventricular systolic function is mildly reduced. The right  ventricular size is normal. Tricuspid regurgitation signal is inadequate  for assessing PA pressure.  3. The mitral valve is normal in structure. No evidence of mitral valve  regurgitation.  4. The aortic valve is abnormal. Moderately calcified. Aortic valve  regurgitation is mild. Mild to moderate aortic valve stenosis. Vmax 2.3  m/s, MG 14 mmHg, AVA 1.2 cm^2, DI 0.43  Microbiology:see note   Medications: Scheduled Meds: . aspirin EC  81 mg Oral Daily  . atorvastatin  40 mg Oral Daily  . cholecalciferol  5,000 Units Oral Daily  . clopidogrel  75 mg Oral Daily  . ezetimibe  10 mg Oral Daily  . furosemide  20 mg Oral Daily  . gabapentin  100 mg Oral QHS  . levothyroxine  50 mcg Oral Q0600  . multivitamin with minerals   Oral Daily  . pantoprazole  40 mg Oral QODAY  . potassium chloride  20 mEq Oral Daily  . sertraline  100 mg Oral Daily  . traZODone  150 mg Oral QHS  . trimethoprim  100 mg Oral Daily   Continuous Infusions:   Antimicrobials: Anti-infectives (From admission, onward)   Start     Dose/Rate Route Frequency Ordered Stop   12/23/19 1000  trimethoprim (TRIMPEX) tablet 100 mg    Note to Pharmacy: Take daily to prevent UTI     100 mg Oral Daily 12/23/19 0806     12/22/19 1345  cefTRIAXone (ROCEPHIN) 1 g in sodium chloride 0.9 % 100 mL IVPB     1 g 200 mL/hr over 30 Minutes Intravenous  Once 12/22/19 1331 12/22/19 1545       Objective: Vitals: Today's Vitals   12/24/19 0048 12/24/19 0504 12/24/19 0900 12/24/19 1219  BP: 102/69 (!) 102/51  106/69  Pulse: 85 77  79  Resp: '15 17  14  '$ Temp: 98.5 F (36.9 C) 97.6 F (36.4 C)  98.5 F (36.9 C)  TempSrc: Oral Oral  Oral  SpO2: 99% 98%  98%  Weight:      Height:      PainSc:   6      Intake/Output Summary (Last 24 hours) at 12/24/2019 1436 Last data filed at  12/24/2019 0649 Gross per 24 hour  Intake 2448.72 ml  Output 1085 ml  Net 1363.72 ml   Filed Weights   12/22/19 1213  Weight: 74.8 kg   Weight change:    Intake/Output from previous day: 03/29 0701 - 03/30 0700 In: 2798.7 [P.O.:350; I.V.:2448.7] Out: 1885 [VEHMC:9470] Intake/Output this shift: No intake/output data recorded.  Examination:  General exam: Not awake oriented, not in acute distress.   HEENT:Oral mucosa moist, Ear/Nose WNL grossly,dentition normal. Respiratory system: bilaterally clear,no wheezing or crackles,no use of accessory muscle, non tender. Cardiovascular system: S1 & S2 +, regular, No JVD. Gastrointestinal system: Abdomen soft, NT,ND, BS+. Nervous System:Alert, awake, moving extremities and grossly nonfocal Extremities: No edema, distal peripheral pulses palpable.  Skin: No rashes,no icterus. MSK: Normal muscle bulk,tone, power  Data Reviewed: I have personally reviewed following labs and imaging studies CBC: Recent Labs  Lab 12/22/19 1243 12/23/19 0001 12/24/19 0121  WBC 9.3 6.9 6.9  NEUTROABS 7.5  --   --   HGB 12.1 10.0* 10.1*  HCT 39.4 33.0* 32.9*  MCV 96.8 97.6 97.6  PLT 154 132* 357*   Basic Metabolic Panel: Recent Labs  Lab 12/22/19 1243 12/23/19 0001 12/24/19 0121  NA 136 140 139  K 4.7 4.6 4.1  CL 97* 102 102  CO2 '28 31 25  '$ GLUCOSE 119* 109* 100*  BUN '21 16 13  '$ CREATININE 1.55* 1.39* 1.22*  CALCIUM 9.4 8.4* 8.0*   GFR: Estimated Creatinine Clearance: 36.2 mL/min (A) (by C-G formula based on SCr of 1.22 mg/dL (H)). Liver Function Tests: Recent Labs  Lab 12/22/19 1243 12/23/19 0001  AST 33 25  ALT 20 18  ALKPHOS 92 73  BILITOT 0.6 0.6  PROT 7.2 6.0*  ALBUMIN 3.6 2.9*   Recent Labs  Lab 12/22/19 1243  LIPASE 31   No results for input(s): AMMONIA in the last 168 hours. Coagulation Profile: No results for input(s): INR, PROTIME in the last 168 hours. Cardiac Enzymes: Recent Labs  Lab 12/22/19 1243  CKTOTAL  75   BNP (last 3 results) Recent Labs    11/05/19 1438  PROBNP 32.0   HbA1C: No results for input(s): HGBA1C in the last 72 hours. CBG: Recent Labs  Lab 12/22/19 1228  GLUCAP 98   Lipid Profile: No results for input(s): CHOL, HDL, LDLCALC, TRIG, CHOLHDL, LDLDIRECT in the last 72 hours. Thyroid Function Tests: Recent Labs    12/22/19 1243  TSH 2.660   Anemia Panel: No results for input(s): VITAMINB12, FOLATE, FERRITIN, TIBC, IRON, RETICCTPCT in the last 72 hours. Sepsis Labs: No results for input(s): PROCALCITON, LATICACIDVEN in the last 168 hours.  Recent Results (from the past 240 hour(s))  Urine culture     Status: Abnormal   Collection Time: 12/22/19  1:15 PM   Specimen: Urine, Clean Catch  Result Value Ref Range Status   Specimen Description URINE, CLEAN CATCH  Final   Special Requests   Final    NONE Performed at [facility], [address], [place], [place] [address]    Culture 40,000 COLONIES/mL ENTEROCOCCUS FAECALIS (A)  Final   Report Status 12/24/2019 FINAL  Final   Organism ID, Bacteria ENTEROCOCCUS FAECALIS (A)  Final      Susceptibility   Enterococcus faecalis - MIC*    AMPICILLIN <=2 SENSITIVE Sensitive     NITROFURANTOIN <=16 SENSITIVE Sensitive     VANCOMYCIN 1 SENSITIVE Sensitive     * 40,000 COLONIES/mL ENTEROCOCCUS FAECALIS  SARS CORONAVIRUS 2 (TAT 6-24 HRS) Nasopharyngeal Nasopharyngeal Swab     Status: None   Collection Time: 12/22/19  3:15 PM   Specimen: Nasopharyngeal Swab  Result Value Ref Range Status   SARS Coronavirus 2 NEGATIVE NEGATIVE Final    Comment: (NOTE) SARS-CoV-2 target nucleic acids are NOT DETECTED. The SARS-CoV-2 RNA is generally detectable in upper and lower respiratory specimens during the acute phase of infection. Negative results do not preclude SARS-CoV-2 infection, do not rule out co-infections with other pathogens, and should not be used as the sole basis for treatment or other patient management  decisions. Negative results must be combined with clinical observations, patient history, and epidemiological information. The expected result is Negative. Fact Sheet for Patients: SugarRoll.be Fact Sheet for Healthcare Providers: https://www.woods-mathews.com/ This test is not yet approved or cleared by the [place] FDA and  has been authorized for detection and/or diagnosis of SARS-CoV-2 by FDA under an Emergency Use Authorization (EUA). This EUA will remain  in effect (meaning this test can be used) for the duration of the COVID-19 declaration under Section  56 4(b)(1) of the Act, 21 U.S.C. section 360bbb-3(b)(1), unless the authorization is terminated or revoked sooner. Performed at [facility], [place] [address] [place], [place] [address]       Radiology Studies: CT Angio Chest PE W/Cm &/Or Wo Cm  Result Date: 12/22/2019 CLINICAL DATA:  Weakness and chest pain. EXAM: CT ANGIOGRAPHY CHEST WITH CONTRAST TECHNIQUE: Multidetector CT imaging of the chest was performed using the standard protocol during bolus administration of intravenous contrast. Multiplanar CT image reconstructions and MIPs were obtained to evaluate the vascular anatomy. CONTRAST:  65m OMNIPAQUE IOHEXOL 350 MG/ML SOLN COMPARISON:  11/14/2017 FINDINGS: Cardiovascular: Contrast injection is sufficient to demonstrate satisfactory opacification of the pulmonary arteries to the segmental level. There is no pulmonary embolus. The main pulmonary artery is within normal limits for size. There is no CT evidence of acute right heart strain. There are atherosclerotic changes of the thoracic aorta without evidence for aneurysm. Heart size is enlarged. There are coronary artery calcifications without evidence for pericardial effusion. Mediastinum/Nodes: --No mediastinal or hilar lymphadenopathy. --No axillary lymphadenopathy. --No supraclavicular lymphadenopathy. --Normal thyroid gland.  --there is some mild diffuse esophageal wall thickening. Lungs/Pleura: There are emphysematous changes bilaterally. There is atelectasis at the lung bases. There is debris within the trachea. There is some bronchial wall thickening and mucus plugging at the lung bases. There is a small right-sided pleural effusion. Upper Abdomen: No acute abnormality. Musculoskeletal: There is a slight irregularity of the T10 superior endplate, new since 2019. there is approximately 15% height loss anteriorly. There is no retropulsion. Review of the MIP images confirms the above findings. IMPRESSION: 1. No acute pulmonary embolism. 2. Findings suspicious for an acute or subacute mild compression fracture of the T10 vertebral body. 3. Bibasilar atelectasis with a trace right-sided pleural effusion. 4. Mild diffuse esophageal wall thickening is nonspecific but can be seen in patients with esophagitis. 5. Mild mucous plugging and bronchial wall thickening at the lung bases suggestive of reactive or infectious bronchiolitis. Aortic Atherosclerosis (ICD10-I70.0) and Emphysema (ICD10-J43.9). Electronically Signed   By: CConstance HolsterM.D.   On: 12/22/2019 17:14   ECHOCARDIOGRAM COMPLETE  Result Date: 12/23/2019    ECHOCARDIOGRAM REPORT   Patient Name:   [name] [name] Date of Exam: 12/23/2019 Medical Rec #:  [id number]        Height:       59.0 in Accession #:    [id number]       Weight:       165.0 lb Date of Birth:  [birth year]       BSA:          1.700 m Patient Age:    89years          BP:           116/52 mmHg Patient Gender: F                 HR:           77 bpm. Exam Location:  Inpatient Procedure: 2D Echo Indications:    Cardiomyopathy-Unspecified 425.9 / I42.9  History:        Patient has prior history of Echocardiogram examinations, most                 recent 07/31/2018. COPD, Signs/Symptoms:Shortness of Breath; Risk                 Factors:Former Smoker, Dyslipidemia and Hypertension. Chronic  respirtatory failure                 CKD.  Sonographer:    Vikki Ports Turrentine Referring Phys: [id number] Elwyn Reach  Sonographer Comments: Suboptimal subcostal window. Image acquisition challenging due to patient body habitus. IMPRESSIONS  1. Left ventricular ejection fraction, by estimation, is 60 to 65%. The left ventricle has normal function. The left ventricle has no regional wall motion abnormalities. There is mild left ventricular hypertrophy. Left ventricular diastolic parameters are indeterminate.  2. Right ventricular systolic function is mildly reduced. The right ventricular size is normal. Tricuspid regurgitation signal is inadequate for assessing PA pressure.  3. The mitral valve is normal in structure. No evidence of mitral valve regurgitation.  4. The aortic valve is abnormal. Moderately calcified. Aortic valve regurgitation is mild. Mild to moderate aortic valve stenosis. Vmax 2.3 m/s, MG 14 mmHg, AVA 1.2 cm^2, DI 0.43 FINDINGS  Left Ventricle: Left ventricular ejection fraction, by estimation, is 60 to 65%. The left ventricle has normal function. The left ventricle has no regional wall motion abnormalities. The left ventricular internal cavity size was normal in size. There is  mild left ventricular hypertrophy. Left ventricular diastolic parameters are indeterminate. Right Ventricle: The right ventricular size is normal. Right vetricular wall thickness was not assessed. Right ventricular systolic function is mildly reduced. Tricuspid regurgitation signal is inadequate for assessing PA pressure. Left Atrium: Left atrial size was normal in size. Right Atrium: Right atrial size was normal in size. Pericardium: Trivial pericardial effusion is present. Presence of pericardial fat pad. Mitral Valve: The mitral valve is normal in structure. No evidence of mitral valve regurgitation. Tricuspid Valve: The tricuspid valve is normal in structure. Tricuspid valve regurgitation is not demonstrated. Aortic Valve:  The aortic valve is abnormal. Aortic valve regurgitation is mild. Aortic regurgitation PHT measures 360 msec. Mild to moderate aortic stenosis is present. There is moderate calcification of the aortic valve. Aortic valve mean gradient measures 11.2 mmHg. Aortic valve peak gradient measures 18.1 mmHg. Aortic valve area, by VTI measures 1.37 cm. Pulmonic Valve: The pulmonic valve was not well visualized. Pulmonic valve regurgitation is trivial. Aorta: The aortic root is normal in size and structure. IAS/Shunts: The interatrial septum was not well visualized.  LEFT VENTRICLE PLAX 2D LVIDd:         4.10 cm  Diastology LVIDs:         2.90 cm  LV e' lateral:   8.05 cm/s LV PW:         1.10 cm  LV E/e' lateral: 10.0 LV IVS:        1.10 cm  LV e' medial:    4.79 cm/s LVOT diam:     1.90 cm  LV E/e' medial:  16.7 LV SV:         62 LV SV Index:   37 LVOT Area:     2.84 cm  RIGHT VENTRICLE RV S prime:     8.05 cm/s LEFT ATRIUM             Index       RIGHT ATRIUM           Index LA diam:        3.60 cm 2.12 cm/m  RA Area:     16.20 cm LA Vol (A2C):   50.0 ml 29.42 ml/m RA Volume:   42.90 ml  25.24 ml/m LA Vol (A4C):   38.8 ml 22.83 ml/m LA Biplane Vol: 45.0 ml 26.48  ml/m  AORTIC VALVE AV Area (Vmax):    1.30 cm AV Area (Vmean):   1.18 cm AV Area (VTI):     1.37 cm AV Vmax:           212.60 cm/s AV Vmean:          160.600 cm/s AV VTI:            0.454 m AV Peak Grad:      18.1 mmHg AV Mean Grad:      11.2 mmHg LVOT Vmax:         97.30 cm/s LVOT Vmean:        66.900 cm/s LVOT VTI:          0.220 m LVOT/AV VTI ratio: 0.48 AI PHT:            360 msec  AORTA Ao Root diam: 3.00 cm MITRAL VALVE MV Area (PHT): 3.12 cm    SHUNTS MV Decel Time: 243 msec    Systemic VTI:  0.22 m MV E velocity: 80.10 cm/s  Systemic Diam: 1.90 cm MV A velocity: 94.70 cm/s MV E/A ratio:  0.85 Oswaldo Milian MD Electronically signed by Oswaldo Milian MD Signature Date/Time: 12/23/2019/11:25:33 PM    Final      LOS: 2 days   Time  spent: More than 50% of that time was spent in counseling and/or coordination of care.  Antonieta Pert, MD [facility]  12/24/2019, 2:36 PM

## 2019-12-24 NOTE — Progress Notes (Signed)
ANTICOAGULATION CONSULT NOTE  Pharmacy Consult for Heparin Indication: chest pain  Allergies  Allergen Reactions  . Doxycycline Nausea Only and Other (See Comments)    Sig GI upset  . Fluticasone-Salmeterol Anxiety    Patient Measurements: Height: 4\' 11"  (149.9 cm) Weight: 165 lb (74.8 kg) IBW/kg (Calculated) : 43.2 Heparin Dosing Weight: 60.3 kg  Vital Signs: Temp: 98.5 F (36.9 C) (03/30 0048) Temp Source: Oral (03/30 0048) BP: 102/69 (03/30 0048) Pulse Rate: 85 (03/30 0048)  Labs: Recent Labs    12/22/19 1243 12/22/19 1243 12/22/19 1529 12/23/19 0001 12/23/19 0001 12/23/19 0724 12/23/19 0838 12/23/19 1611 12/24/19 0121  HGB 12.1   < >  --  10.0*  --   --   --   --  10.1*  HCT 39.4  --   --  33.0*  --   --   --   --  32.9*  PLT 154  --   --  132*  --   --   --   --  138*  HEPARINUNFRC  --   --   --  0.93*   < > 0.73*  --  0.73* 0.52  CREATININE 1.55*  --   --  1.39*  --   --   --   --  1.22*  CKTOTAL 75  --   --   --   --   --   --   --   --   TROPONINIHS 403*  --  392*  --   --   --  208*  --   --    < > = values in this interval not displayed.    Estimated Creatinine Clearance: 36.2 mL/min (A) (by C-G formula based on SCr of 1.22 mg/dL (H)).   Medical History: Past Medical History:  Diagnosis Date  . Aneurysm (HCC)   . Anxiety   . Anxiety and depression   . Carotid stenosis    R 50% (12/2012)  . Concussion 08/03/2015  . COPD (chronic obstructive pulmonary disease) (HCC) 12/2012   spirometry: Pre: FVC 84%, FEV1 69%, ratio 0.64 consistent with moderate obstruction.  . Depression   . Fall 08/03/2015   d/c home health 08/2015  . Fracture of cervical vertebra, C5 (HCC) 08/06/2015  . History of chicken pox   . Hyperlipidemia   . Hypertension   . Lower back pain    h/o HNP s/p surgery  . Osteoarthritis    h/o ruptured disc s/p ESI  . Osteoporosis 11/2010   DEXA -2.7 spine, thoracic compression fracture  . Peripheral vascular disease (HCC)   . Smoker     quit 10/2012  . Stroke [facility]) 2010   x3 with residual R hemiparesis, s/p R MCA balloon angioplasty (2010)   Assessment: Patient is a 74 yr old female who presented to the ED with  weakness. The patient had a troponin bump 65), now attributed to suspected myocarditis, and an elevated d-dimer. Pharmacy was consulted to dose heparin for PE; CTA was negative. Pt continues on IV heparin for elevated troponin; Cardiology has been consulted.  Heparin level now therapeutic  Goal of Therapy:  Heparin level 0.3-0.7 units/ml Monitor platelets by anticoagulation protocol: Yes   Plan:  Continue heparin at 750 units / hr Monitor daily heparin level, CBC Monitor for signs/symptoms of bleeding  Thank you ([phone], PharmD 12/24/2019,2:07 AM

## 2019-12-24 NOTE — Progress Notes (Signed)
Progress Note  Patient Name: CAMDEN MAZZAFERRO Date of Encounter: 12/24/2019  Primary Cardiologist: New  Subjective   Breathing is OK   No CP    Inpatient Medications    Scheduled Meds: . aspirin EC  81 mg Oral Daily  . atorvastatin  40 mg Oral Daily  . cholecalciferol  5,000 Units Oral Daily  . clopidogrel  75 mg Oral Daily  . ezetimibe  10 mg Oral Daily  . furosemide  20 mg Oral Daily  . gabapentin  100 mg Oral QHS  . levothyroxine  50 mcg Oral Q0600  . multivitamin with minerals   Oral Daily  . pantoprazole  40 mg Oral QODAY  . potassium chloride  20 mEq Oral Daily  . sertraline  100 mg Oral Daily  . traZODone  150 mg Oral QHS  . trimethoprim  100 mg Oral Daily   Continuous Infusions: . sodium chloride 100 mL/hr at 12/24/19 0502  . heparin 750 Units/hr (12/24/19 0400)   PRN Meds: acetaminophen **OR** acetaminophen, albuterol, HYDROcodone-acetaminophen, methocarbamol, ondansetron **OR** ondansetron (ZOFRAN) IV, traMADol   Vital Signs    Vitals:   12/23/19 2023 12/23/19 2037 12/24/19 0048 12/24/19 0504  BP: (!) 84/68 (!) 100/51 102/69 (!) 102/51  Pulse: 80 81 85 77  Resp: 16 18 15 17   Temp: 98.1 F (36.7 C) 98.5 F (36.9 C) 98.5 F (36.9 C) 97.6 F (36.4 C)  TempSrc: Oral Oral Oral Oral  SpO2: 98% 99% 99% 98%  Weight:      Height:        Intake/Output Summary (Last 24 hours) at 12/24/2019 0959 Last data filed at 12/24/2019 0649 Gross per 24 hour  Intake 2798.72 ml  Output 1885 ml  Net 913.72 ml    I/O  +  2.7   L Last 3 Weights 12/22/2019 12/20/2019 11/25/2019  Weight (lbs) 165 lb (No Data) 186 lb  Weight (kg) 74.844 kg (No Data) 84.369 kg      Telemetry    SR  - Personally Reviewed  ECG    Not done   Personally Reviewed  Physical Exam   GEN: No acute distress.   Neck: JVP is increased    Cardiac: RRR, no murmurs, rubs, or gallops.  Respiratory: Clear to auscultation bilaterally. GI: Soft, nontender, non-distended  MS:  Triv  LE  edema Neuro:  Nonfocal  Psych: Normal affect   Labs    High Sensitivity Troponin:   Recent Labs  Lab 12/22/19 1243 12/22/19 1529 12/23/19 0838  TROPONINIHS 403* 392* 208*      Chemistry Recent Labs  Lab 12/22/19 1243 12/23/19 0001 12/24/19 0121  NA 136 140 139  K 4.7 4.6 4.1  CL 97* 102 102  CO2 28 31 25   GLUCOSE 119* 109* 100*  BUN 21 16 13   CREATININE 1.55* 1.39* 1.22*  CALCIUM 9.4 8.4* 8.0*  PROT 7.2 6.0*  --   ALBUMIN 3.6 2.9*  --   AST 33 25  --   ALT 20 18  --   ALKPHOS 92 73  --   BILITOT 0.6 0.6  --   GFRNONAA 33* 38* 44*  GFRAA 38* 43* 51*  ANIONGAP 11 7 12      Hematology Recent Labs  Lab 12/22/19 1243 12/23/19 0001 12/24/19 0121  WBC 9.3 6.9 6.9  RBC 4.07 3.38* 3.37*  HGB 12.1 10.0* 10.1*  HCT 39.4 33.0* 32.9*  MCV 96.8 97.6 97.6  MCH 29.7 29.6 30.0  MCHC 30.7 30.3 30.7  RDW 13.2 13.1 13.2  PLT 154 132* 138*    BNP Recent Labs  Lab 12/22/19 1243  BNP 136.0*     DDimer  Recent Labs  Lab 12/22/19 1243  DDIMER 3.52*     Radiology      Cardiac Studies   Left ventricular ejection fraction, by estimation, is 60 to 65%. The left ventricle has normal function. The left ventricle has no regional wall motion abnormalities. There is mild left ventricular hypertrophy. Left ventricular diastolic parameters are indeterminate. 2. Right ventricular systolic function is mildly reduced. The right ventricular size is normal. Tricuspid regurgitation signal is inadequate for assessing PA pressure. 3. The mitral valve is normal in structure. No evidence of mitral valve regurgitation. 4. The aortic valve is abnormal. Moderately calcified. Aortic valve regurgitation is mild. Mild to moderate aortic valve stenosis. Vmax 2.3 m/s, MG 14 mmHg, AVA 1.2 cm^2, DI 0.43  Patient Profile     74 y.o. female hx of CVA, diastolic CHF, COPD, mild AS who presents with weakness    Assessment & Plan      1   Elevated troponin Very mild, flat elevation of  troponin    She is without CP    Echo noted above   I am not convinced of active ischemia   Stop heparin   Ambulate      2   Chronic diastolic CHF  Volume is diffficult   She got lasix but also got IV fluid   Cr lower today than yesterday  BNP 71. I would stop IV fluids   AMbulate, follow sats   Keep on low dose lasix and low Na diet    3  Hx CVA   On ASA / plavix    4  AS   Mean gradient on echo was 14 mm Hg   Consistent with mild AS  Follow over time     If tolerates movement, OK to d/c from cardiac [place].  Will make sure she has f/u   Please consult [email or web address] for contact info under        Signed, Dietrich Pates, MD  12/24/2019, 9:59 AM

## 2019-12-24 NOTE — Progress Notes (Signed)
Called her daughter again per request.She is concerned abt her mother's "spasm", or c/I spasm first starting in forehead then to back all the way down with radiation to eat left sides. Has difficulty with walking , started after vaccine dose.Daughter thinks she is confused sometime, also asking neuro eval. I discussed with  her, will start with X-ray back and ct head. ?neuro eval tomorrow

## 2019-12-25 ENCOUNTER — Inpatient Hospital Stay (HOSPITAL_COMMUNITY): Payer: Medicare HMO

## 2019-12-25 LAB — BASIC METABOLIC PANEL
Anion gap: 11 (ref 5–15)
BUN: 11 mg/dL (ref 8–23)
CO2: 26 mmol/L (ref 22–32)
Calcium: 8.6 mg/dL — ABNORMAL LOW (ref 8.9–10.3)
Chloride: 100 mmol/L (ref 98–111)
Creatinine, Ser: 1.08 mg/dL — ABNORMAL HIGH (ref 0.44–1.00)
GFR calc Af Amer: 59 mL/min — ABNORMAL LOW (ref >60)
GFR calc non Af Amer: 51 mL/min — ABNORMAL LOW (ref >60)
Glucose, Bld: 90 mg/dL (ref 70–99)
Potassium: 4.2 mmol/L (ref 3.5–5.1)
Sodium: 137 mmol/L (ref 135–145)

## 2019-12-25 LAB — CBC
HCT: 34.5 % — ABNORMAL LOW (ref 36.0–46.0)
Hemoglobin: 10.7 g/dL — ABNORMAL LOW (ref 12.0–15.0)
MCH: 29.9 pg (ref 26.0–34.0)
MCHC: 31 g/dL (ref 30.0–36.0)
MCV: 96.4 fL (ref 80.0–100.0)
Platelets: 150 10*3/uL (ref 150–400)
RBC: 3.58 MIL/uL — ABNORMAL LOW (ref 3.87–5.11)
RDW: 13.1 % (ref 11.5–15.5)
WBC: 5.3 10*3/uL (ref 4.0–10.5)
nRBC: 0 % (ref 0.0–0.2)

## 2019-12-25 MED ORDER — HEPARIN SODIUM (PORCINE) 5000 UNIT/ML IJ SOLN
5000.0000 [IU] | Freq: Three times a day (TID) | INTRAMUSCULAR | Status: DC
  Administered 2019-12-25 – 2019-12-26 (×3): 5000 [IU] via SUBCUTANEOUS
  Filled 2019-12-25 (×3): qty 1

## 2019-12-25 NOTE — Progress Notes (Signed)
Orthopedic Tech Progress Note Patient Details:  [name] [name] [birth year] [id number] Called in order to [facility] for a [facility] Patient ID: PREESHA BENJAMIN, female   DOB: 1946-03-05, 74 y.o.   MRN: [id number]   Donald Pore 12/25/2019, 4:34 PM

## 2019-12-25 NOTE — Progress Notes (Addendum)
Progress Note  Patient Name: [name] [name] Date of Encounter: 12/25/2019  Primary Cardiologist: New  Subjective   Patient breathing is OK laying flat    Inpatient Medications    Scheduled Meds: . aspirin EC  81 mg Oral Daily  . atorvastatin  40 mg Oral Daily  . cholecalciferol  5,000 Units Oral Daily  . clopidogrel  75 mg Oral Daily  . ezetimibe  10 mg Oral Daily  . furosemide  20 mg Oral Daily  . gabapentin  100 mg Oral QHS  . levothyroxine  50 mcg Oral Q0600  . multivitamin with minerals   Oral Daily  . pantoprazole  40 mg Oral QODAY  . potassium chloride  20 mEq Oral Daily  . sertraline  100 mg Oral Daily  . traZODone  150 mg Oral QHS  . trimethoprim  100 mg Oral Daily   Continuous Infusions:  PRN Meds: acetaminophen **OR** acetaminophen, albuterol, HYDROcodone-acetaminophen, methocarbamol, ondansetron **OR** ondansetron (ZOFRAN) IV, traMADol   Vital Signs    Vitals:   12/24/19 0504 12/24/19 1219 12/24/19 2106 12/25/19 0326  BP: (!) 102/51 106/69 (!) 126/51 (!) 110/35  Pulse: 77 79 80 74  Resp: 17 14 19 14   Temp: 97.6 F (36.4 C) 98.5 F (36.9 C) 98.2 F (36.8 C) 98 F (36.7 C)  TempSrc: Oral Oral Oral Oral  SpO2: 98% 98% 96% 98%  Weight:      Height:        Intake/Output Summary (Last 24 hours) at 12/25/2019 0728 Last data filed at 12/25/2019 6283 Gross per 24 hour  Intake 100 ml  Output 800 ml  Net -700 ml    I/O  +  2.1     L Last 3 Weights 12/22/2019 12/20/2019 11/25/2019  Weight (lbs) 165 lb (No Data) 186 lb  Weight (kg) 74.844 kg (No Data) 84.369 kg      Telemetry     SR  - Personally Reviewed  ECG    Not done   Personally Reviewed  Physical Exam   GEN: No acute distress.   Neck: JVP is normla     Cardiac: RRR, no murmurs, rubs, or gallops.  Respiratory: Clear to auscultation bilaterally. GI: Soft, nontender, non-distended  MS:  Triv  LE edema Neuro:  Nonfocal  Psych: Normal affect   Labs    High Sensitivity Troponin:    Recent Labs  Lab 12/22/19 1243 12/22/19 1529 12/23/19 0838  TROPONINIHS 403* 392* 208*      Chemistry Recent Labs  Lab 12/22/19 1243 12/22/19 1243 12/23/19 0001 12/24/19 0121 12/25/19 0213  NA 136   < > 140 139 137  K 4.7   < > 4.6 4.1 4.2  CL 97*   < > 102 102 100  CO2 28   < > 31 25 26   GLUCOSE 119*   < > 109* 100* 90  BUN 21   < > 16 13 11   CREATININE 1.55*   < > 1.39* 1.22* 1.08*  CALCIUM 9.4   < > 8.4* 8.0* 8.6*  PROT 7.2  --  6.0*  --   --   ALBUMIN 3.6  --  2.9*  --   --   AST 33  --  25  --   --   ALT 20  --  18  --   --   ALKPHOS 92  --  73  --   --   BILITOT 0.6  --  0.6  --   --  GFRNONAA 33*   < > 38* 44* 51*  GFRAA 38*   < > 43* 51* 59*  ANIONGAP 11   < > 7 12 11    < > = values in this interval not displayed.     Hematology Recent Labs  Lab 12/23/19 0001 12/24/19 0121 12/25/19 0213  WBC 6.9 6.9 5.3  RBC 3.38* 3.37* 3.58*  HGB 10.0* 10.1* 10.7*  HCT 33.0* 32.9* 34.5*  MCV 97.6 97.6 96.4  MCH 29.6 30.0 29.9  MCHC 30.3 30.7 31.0  RDW 13.1 13.2 13.1  PLT 132* 138* 150    BNP Recent Labs  Lab 12/22/19 1243 12/24/19 0121  BNP 136.0* 71.7     DDimer  Recent Labs  Lab 12/22/19 1243  DDIMER 3.52*     Radiology      Cardiac Studies   Left ventricular ejection fraction, by estimation, is 60 to 65%. The left ventricle has normal function. The left ventricle has no regional wall motion abnormalities. There is mild left ventricular hypertrophy. Left ventricular diastolic parameters are indeterminate. 2. Right ventricular systolic function is mildly reduced. The right ventricular size is normal. Tricuspid regurgitation signal is inadequate for assessing PA pressure. 3. The mitral valve is normal in structure. No evidence of mitral valve regurgitation. 4. The aortic valve is abnormal. Moderately calcified. Aortic valve regurgitation is mild. Mild to moderate aortic valve stenosis. Vmax 2.3 m/s, MG 14 mmHg, AVA 1.2 cm^2, DI 0.43   Patient Profile     74 y.o. female hx of CVA, diastolic CHF, COPD, mild AS who presents with weakness    Assessment & Plan      1   Elevated troponin Very mild, flat elevation of troponin    She is without CP    Would treat medically      2   Chronic diastolic CHF  Volume is not bad   Last BNP 71  She is comfortable laying flat    Keep on same meds   3  Hx CVA   On ASA / plavix    4  AS   Mean gradient on echo was 14 mm Hg   Consistent with mild AS  Follow with periodica echoes over time     5  HTN  NOte taht she is off of benazepril   I would not restart  This may explain some of problem on admit if BP too low .   OK to d/c   Will make sure she has outpt f/u    Please consult [email or web address] for contact info under        Signed, 74, MD  12/25/2019, 7:28 AM

## 2019-12-25 NOTE — Progress Notes (Addendum)
PROGRESS NOTE    [name] [name]  GNF:[id number] DOB: 1946-09-08 DOA: 12/22/2019 PCP: [name] Boyden, MD   Brief Narrative:  HPI On 12/22/2019 by Dr. Earlie Lou LEEANDRA OELKE is a 74 y.o. female with medical history significant of COPD, hypertension, osteoarthritis, osteoporosis, anxiety with depression, carotid stenosis, CVA status post residual right-sided weakness, diastolic heart failure, mild aortic stenosis former smoker with peripheral vascular disease, hyperlipidemia and spinal compression fractures, morbidly obese coming to the hospital with generalized fatigue weakness and muscle spasms since she got her second more than a vaccine on March 23.  Patient has had some shortness of breath and lower extremity edema within the last month.  She is known to have diastolic heart failure.  She denied any significant chest pain but generally some discomfort.  Patient was noted to have elevated troponins in the ER.  No evidence of PE.  Had transient ST changes on the EKG.  Patient is suspected to have had some type of cardiomyopathy and is being admitted to the hospital for treatment..  Interim history Patient presented with generalized weakness and muscle spasms after receiving her second dose of the Covid vaccine on December 17, 2019.  Found to have an elevated troponin.  Cardiology consulted. Assessment & Plan   Elevated troponin -Troponin on admission 403, trending downward to 08 -Echocardiogram showed an EF of 60 to 65%.  LV diastolic parameters indeterminate. -Cardiology consulted and appreciated -Patient was placed on heparin however this was discontinued -Cardiology does not feel this is active ischemia as troponins have been mild with flat elevation -Continue aspirin, statin, Plavix  Aortic stenosis -Mean gradient 14 mmHg consistent with mild left ear, follow-up as an outpatient  Chronic diastolic CHF -Appears to be euvolemic -BNP 71 -Continue low-dose Lasix and  low-sodium diet -Check intake and output, daily weights  Essential hypertension -BP has been low, benazepril held  History of CVA/carotid stenosis/peripheral vascular disease/hyperlipidemia -With residual right-sided weakness -Continue statin, Plavix, aspirin, Zetia  Acute kidney injury on chronic kidney disease, stage IIIa -Baseline creatinine approximately 1, was 1.5 on admission -Benazepril held -Continue to monitor BMP  Anemia of chronic disease -Hemoglobin appears stable, continue to monitor CBC  Pyuria -Urine culture shows 40K E faecalis -Patient denies dysuria or increased urinary frequency or other UTI symptoms -On chronic TMP at home  COPD -Currently not in exacerbation.  Stable. -Continue albuterol as needed  Anxiety/depression -Continue Zoloft, trazodone  Hypothyroidism  -Continue Synthroid  Generalized weakness/debility -PT and OT initially recommended SNF; now recommending home health  Spasms/Back pain -Discussed with patient this morning, tells me that she has back spasms. -Has been ongoing for 1 week since fall at home. -Change on 12/24/2019 showing osteopenia limits detection of nondisplaced fractures.  Old compression fractures L1, T11 vertebral bodies.  L4 vertebral augmentation.  High clinical suspicion for occult fracture involving the lumbar spine, MRI recommended. -MRI of the lumbar spine ordered -Continue muscle relaxers and pain control  DVT Prophylaxis  Will place on heparin SQ  Code Status: Full  Family Communication: None at bedside. Daughter via phone.   Disposition Plan: Admitted from home for chest pain, found to have elevated troponin, cardiology consulted.  Now complaining of spasms, MRI pending.  Suspect home with home health in 1 to 2 days.  Consultants Cardiology  Procedures  Echocardiogram  Antibiotics   Anti-infectives (From admission, onward)   Start     Dose/Rate Route Frequency Ordered Stop   12/23/19 1000   trimethoprim (TRIMPEX) tablet 100  mg    Note to Pharmacy: Take daily to prevent UTI     100 mg Oral Daily 12/23/19 0806     12/22/19 1345  cefTRIAXone (ROCEPHIN) 1 g in sodium chloride 0.9 % 100 mL IVPB     1 g 200 mL/hr over 30 Minutes Intravenous  Once 12/22/19 1331 12/22/19 1545      Subjective:   [name] [name] seen and examined today.  Continues to feel spasms, mostly in her back.  Currently denies any chest pain or shortness of breath.  Denies any abdominal pain, nausea or vomiting, diarrhea or constipation, dizziness or headache.    Objective:   Vitals:   12/24/19 1219 12/24/19 2106 12/25/19 0326 12/25/19 0938  BP: 106/69 (!) 126/51 (!) 110/35 (!) 138/52  Pulse: 79 80 74 77  Resp: 14 19 14 13   Temp: 98.5 F (36.9 C) 98.2 F (36.8 C) 98 F (36.7 C) 98.1 F (36.7 C)  TempSrc: Oral Oral Oral Oral  SpO2: 98% 96% 98% 96%  Weight:      Height:        Intake/Output Summary (Last 24 hours) at 12/25/2019 0957 Last data filed at 12/25/2019 1610 Gross per 24 hour  Intake 100 ml  Output 800 ml  Net -700 ml   Filed Weights   12/22/19 1213  Weight: 74.8 kg    Exam  General: Well developed, well nourished, NAD, appears stated age  HEENT: NCAT, mucous membranes moist.   Cardiovascular: S1 S2 auscultated, RRR, no murmur  Respiratory: Clear to auscultation bilaterally   Abdomen: Soft, nontender, nondistended, + bowel sounds  Extremities: warm dry without cyanosis clubbing. Mild LE edema  Neuro: AAOx3, nonfocal  Psych: Appropriate mood and affect   Data Reviewed: I have personally reviewed following labs and imaging studies  CBC: Recent Labs  Lab 12/22/19 1243 12/23/19 0001 12/24/19 0121 12/25/19 0213  WBC 9.3 6.9 6.9 5.3  NEUTROABS 7.5  --   --   --   HGB 12.1 10.0* 10.1* 10.7*  HCT 39.4 33.0* 32.9* 34.5*  MCV 96.8 97.6 97.6 96.4  PLT 154 132* 138* 150   Basic Metabolic Panel: Recent Labs  Lab 12/22/19 1243 12/23/19 0001 12/24/19 0121  12/25/19 0213  NA 136 140 139 137  K 4.7 4.6 4.1 4.2  CL 97* 102 102 100  CO2 28 31 25 26   GLUCOSE 119* 109* 100* 90  BUN 21 16 13 11   CREATININE 1.55* 1.39* 1.22* 1.08*  CALCIUM 9.4 8.4* 8.0* 8.6*   GFR: Estimated Creatinine Clearance: 40.9 mL/min (A) (by C-G formula based on SCr of 1.08 mg/dL (H)). Liver Function Tests: Recent Labs  Lab 12/22/19 1243 12/23/19 0001  AST 33 25  ALT 20 18  ALKPHOS 92 73  BILITOT 0.6 0.6  PROT 7.2 6.0*  ALBUMIN 3.6 2.9*   Recent Labs  Lab 12/22/19 1243  LIPASE 31   No results for input(s): AMMONIA in the last 168 hours. Coagulation Profile: No results for input(s): INR, PROTIME in the last 168 hours. Cardiac Enzymes: Recent Labs  Lab 12/22/19 1243  CKTOTAL 75   BNP (last 3 results) Recent Labs    11/05/19 1438  PROBNP 32.0   HbA1C: No results for input(s): HGBA1C in the last 72 hours. CBG: Recent Labs  Lab 12/22/19 1228  GLUCAP 98   Lipid Profile: No results for input(s): CHOL, HDL, LDLCALC, TRIG, CHOLHDL, LDLDIRECT in the last 72 hours. Thyroid Function Tests: Recent Labs    12/22/19 1243  TSH 2.660   Anemia Panel: No results for input(s): VITAMINB12, FOLATE, FERRITIN, TIBC, IRON, RETICCTPCT in the last 72 hours. Urine analysis:    Component Value Date/Time   COLORURINE YELLOW 12/22/2019 1245   APPEARANCEUR CLEAR 12/22/2019 1245   APPEARANCEUR Clear 11/05/2019 1015   LABSPEC 1.013 12/22/2019 1245   PHURINE 5.0 12/22/2019 1245   GLUCOSEU NEGATIVE 12/22/2019 1245   HGBUR NEGATIVE 12/22/2019 1245   BILIRUBINUR NEGATIVE 12/22/2019 1245   BILIRUBINUR Negative 11/05/2019 1015   KETONESUR NEGATIVE 12/22/2019 1245   PROTEINUR NEGATIVE 12/22/2019 1245   UROBILINOGEN 0.2 06/10/2019 1606   UROBILINOGEN 0.2 08/02/2015 1200   NITRITE NEGATIVE 12/22/2019 1245   LEUKOCYTESUR MODERATE (A) 12/22/2019 1245   Sepsis Labs: @LABRCNTIP (procalcitonin:4,lacticidven:4)  ) Recent Results (from the past 240 hour(s))  Urine  culture     Status: Abnormal   Collection Time: 12/22/19  1:15 PM   Specimen: Urine, Clean Catch  Result Value Ref Range Status   Specimen Description URINE, CLEAN CATCH  Final   Special Requests   Final    NONE Performed at [facility], [address], [place], [place] [address]    Culture 40,000 COLONIES/mL ENTEROCOCCUS FAECALIS (A)  Final   Report Status 12/24/2019 FINAL  Final   Organism ID, Bacteria ENTEROCOCCUS FAECALIS (A)  Final      Susceptibility   Enterococcus faecalis - MIC*    AMPICILLIN <=2 SENSITIVE Sensitive     NITROFURANTOIN <=16 SENSITIVE Sensitive     VANCOMYCIN 1 SENSITIVE Sensitive     * 40,000 COLONIES/mL ENTEROCOCCUS FAECALIS  SARS CORONAVIRUS 2 (TAT 6-24 HRS) Nasopharyngeal Nasopharyngeal Swab     Status: None   Collection Time: 12/22/19  3:15 PM   Specimen: Nasopharyngeal Swab  Result Value Ref Range Status   SARS Coronavirus 2 NEGATIVE NEGATIVE Final    Comment: (NOTE) SARS-CoV-2 target nucleic acids are NOT DETECTED. The SARS-CoV-2 RNA is generally detectable in upper and lower respiratory specimens during the acute phase of infection. Negative results do not preclude SARS-CoV-2 infection, do not rule out co-infections with other pathogens, and should not be used as the sole basis for treatment or other patient management decisions. Negative results must be combined with clinical observations, patient history, and epidemiological information. The expected result is Negative. Fact Sheet for Patients: HairSlick.no Fact Sheet for Healthcare Providers: quierodirigir.com This test is not yet approved or cleared by the [place] FDA and  has been authorized for detection and/or diagnosis of SARS-CoV-2 by FDA under an Emergency Use Authorization (EUA). This EUA will remain  in effect (meaning this test can be used) for the duration of the COVID-19 declaration under Section 56 4(b)(1) of the  Act, 21 U.S.C. section 360bbb-3(b)(1), unless the authorization is terminated or revoked sooner. Performed at [facility], [address] [place], [place] [address]       Radiology Studies: DG Thoracic Spine 2 View  Result Date: 12/24/2019 CLINICAL DATA:  Back pain.  History of thoracic compression fracture EXAM: THORACIC SPINE 2 VIEWS COMPARISON:  CT 2 days prior FINDINGS: The previously demonstrated T10 vertebral body fractures better visualized on the prior study. No additional compression fracture identified. There is an old T11 compression fracture. There is diffuse osteopenia. Mild degenerative changes are noted. Aortic calcifications are noted. IMPRESSION: 1. Previously demonstrated T10 compression fracture is better visualized on recent CT. 2. There is diffuse osteopenia which limits detection of nondisplaced fractures. If there is high clinical suspicion for an additional acute  fracture of the thoracic spine, follow-up with MRI is recommended. Electronically Signed   By: Katherine Mantle M.D.   On: 12/24/2019 22:07   DG Lumbar Spine 2-3 Views  Result Date: 12/24/2019 CLINICAL DATA:  Upper back pain. EXAM: LUMBAR SPINE - 2-3 VIEW COMPARISON:  December 20, 2019 FINDINGS: The patient is status post prior L4 vertebral augmentation. There is diffuse osteopenia which limits detection of nondisplaced fractures. There is a compression fracture of the L1 and T11 vertebral bodies which appears chronic. Mild-to-moderate multilevel degenerative changes are noted throughout the visualized lumbar spine. Advanced aortic calcifications are noted. IMPRESSION: 1. Osteopenia limits detection of nondisplaced fractures. 2. Old compression fractures as detailed above. The patient is status post prior L4 vertebral augmentation. 3. If there is high clinical suspicion for an occult fracture involving the lumbar spine, follow-up with MRI is recommended given the patient's diffuse osteopenia. 4.  Aortic  Atherosclerosis (ICD10-I70.0). Electronically Signed   By: Katherine Mantle M.D.   On: 12/24/2019 22:05   CT HEAD WO CONTRAST  Result Date: 12/24/2019 CLINICAL DATA:  Mental status change. Confusion. EXAM: CT HEAD WITHOUT CONTRAST TECHNIQUE: Contiguous axial images were obtained from the base of the skull through the vertex without intravenous contrast. COMPARISON:  12/22/2019 FINDINGS: Brain: There is no evidence of acute infarct, intracranial hemorrhage, mass, midline shift, or extra-axial fluid collection. There is advanced cerebral atrophy which is particularly severe in the parietal lobes. Hypodensities in the cerebral white matter bilaterally are unchanged and nonspecific but compatible with mild chronic small vessel ischemic disease. Small chronic infarcts are again seen in the cerebellum bilaterally. Vascular: Calcified atherosclerosis at the skull base. No hyperdense vessel. Skull: No fracture suspicious osseous lesion. Sinuses/Orbits: Clear paranasal sinuses. Small chronic mastoid effusions. Bilateral cataract extraction. Other: None. IMPRESSION: 1. No evidence of acute intracranial abnormality. 2. Advanced cerebral atrophy. Electronically Signed   By: Sebastian Ache M.D.   On: 12/24/2019 20:47   ECHOCARDIOGRAM COMPLETE  Result Date: 12/23/2019    ECHOCARDIOGRAM REPORT   Patient Name:   MYKELL LUNEAU Maxson Date of Exam: 12/23/2019 Medical Rec #:  [id number]         Height:       59.0 in Accession #:    [id number]        Weight:       165.0 lb Date of Birth:  [birth year]        BSA:          1.700 m Patient Age:    73 years          BP:           116/52 mmHg Patient Gender: F                 HR:           77 bpm. Exam Location:  Inpatient Procedure: 2D Echo Indications:    Cardiomyopathy-Unspecified 425.9 / I42.9  History:        Patient has prior history of Echocardiogram examinations, most                 recent 07/31/2018. COPD, Signs/Symptoms:Shortness of Breath; Risk                 Factors:Former  Smoker, Dyslipidemia and Hypertension. Chronic                 respirtatory failure                 CKD.  Sonographer:    Leeroy Bock Turrentine Referring Phys: [id number] Rometta Emery  Sonographer Comments: Suboptimal subcostal window. Image acquisition challenging due to patient body habitus. IMPRESSIONS  1. Left ventricular ejection fraction, by estimation, is 60 to 65%. The left ventricle has normal function. The left ventricle has no regional wall motion abnormalities. There is mild left ventricular hypertrophy. Left ventricular diastolic parameters are indeterminate.  2. Right ventricular systolic function is mildly reduced. The right ventricular size is normal. Tricuspid regurgitation signal is inadequate for assessing PA pressure.  3. The mitral valve is normal in structure. No evidence of mitral valve regurgitation.  4. The aortic valve is abnormal. Moderately calcified. Aortic valve regurgitation is mild. Mild to moderate aortic valve stenosis. Vmax 2.3 m/s, MG 14 mmHg, AVA 1.2 cm^2, DI 0.43 FINDINGS  Left Ventricle: Left ventricular ejection fraction, by estimation, is 60 to 65%. The left ventricle has normal function. The left ventricle has no regional wall motion abnormalities. The left ventricular internal cavity size was normal in size. There is  mild left ventricular hypertrophy. Left ventricular diastolic parameters are indeterminate. Right Ventricle: The right ventricular size is normal. Right vetricular wall thickness was not assessed. Right ventricular systolic function is mildly reduced. Tricuspid regurgitation signal is inadequate for assessing PA pressure. Left Atrium: Left atrial size was normal in size. Right Atrium: Right atrial size was normal in size. Pericardium: Trivial pericardial effusion is present. Presence of pericardial fat pad. Mitral Valve: The mitral valve is normal in structure. No evidence of mitral valve regurgitation. Tricuspid Valve: The tricuspid valve is normal in structure.  Tricuspid valve regurgitation is not demonstrated. Aortic Valve: The aortic valve is abnormal. Aortic valve regurgitation is mild. Aortic regurgitation PHT measures 360 msec. Mild to moderate aortic stenosis is present. There is moderate calcification of the aortic valve. Aortic valve mean gradient measures 11.2 mmHg. Aortic valve peak gradient measures 18.1 mmHg. Aortic valve area, by VTI measures 1.37 cm. Pulmonic Valve: The pulmonic valve was not well visualized. Pulmonic valve regurgitation is trivial. Aorta: The aortic root is normal in size and structure. IAS/Shunts: The interatrial septum was not well visualized.  LEFT VENTRICLE PLAX 2D LVIDd:         4.10 cm  Diastology LVIDs:         2.90 cm  LV e' lateral:   8.05 cm/s LV PW:         1.10 cm  LV E/e' lateral: 10.0 LV IVS:        1.10 cm  LV e' medial:    4.79 cm/s LVOT diam:     1.90 cm  LV E/e' medial:  16.7 LV SV:         62 LV SV Index:   37 LVOT Area:     2.84 cm  RIGHT VENTRICLE RV S prime:     8.05 cm/s LEFT ATRIUM             Index       RIGHT ATRIUM           Index LA diam:        3.60 cm 2.12 cm/m  RA Area:     16.20 cm LA Vol (A2C):   50.0 ml 29.42 ml/m RA Volume:   42.90 ml  25.24 ml/m LA Vol (A4C):   38.8 ml 22.83 ml/m LA Biplane Vol: 45.0 ml 26.48 ml/m  AORTIC VALVE AV Area (Vmax):    1.30 cm AV Area (Vmean):   1.18 cm AV  Area (VTI):     1.37 cm AV Vmax:           212.60 cm/s AV Vmean:          160.600 cm/s AV VTI:            0.454 m AV Peak Grad:      18.1 mmHg AV Mean Grad:      11.2 mmHg LVOT Vmax:         97.30 cm/s LVOT Vmean:        66.900 cm/s LVOT VTI:          0.220 m LVOT/AV VTI ratio: 0.48 AI PHT:            360 msec  AORTA Ao Root diam: 3.00 cm MITRAL VALVE MV Area (PHT): 3.12 cm    SHUNTS MV Decel Time: 243 msec    Systemic VTI:  0.22 m MV E velocity: 80.10 cm/s  Systemic Diam: 1.90 cm MV A velocity: 94.70 cm/s MV E/A ratio:  0.85 Epifanio Lesches MD Electronically signed by Epifanio Lesches MD Signature  Date/Time: 12/23/2019/11:25:33 PM    Final      Scheduled Meds: . aspirin EC  81 mg Oral Daily  . atorvastatin  40 mg Oral Daily  . cholecalciferol  5,000 Units Oral Daily  . clopidogrel  75 mg Oral Daily  . ezetimibe  10 mg Oral Daily  . furosemide  20 mg Oral Daily  . gabapentin  100 mg Oral QHS  . levothyroxine  50 mcg Oral Q0600  . multivitamin with minerals   Oral Daily  . pantoprazole  40 mg Oral QODAY  . potassium chloride  20 mEq Oral Daily  . sertraline  100 mg Oral Daily  . traZODone  150 mg Oral QHS  . trimethoprim  100 mg Oral Daily   Continuous Infusions:   LOS: 3 days   Time Spent in minutes   45 minutes  Zuha Dejonge D.O. on 12/25/2019 at 9:57 AM  Between 7am to 7pm - Please see pager noted on [email or web address]  After 7pm go to [email or web address]  And look for the night coverage person covering for me after hours  [facility] Office  [phone]

## 2019-12-26 ENCOUNTER — Telehealth: Payer: Self-pay | Admitting: Physician Assistant

## 2019-12-26 DIAGNOSIS — R0602 Shortness of breath: Secondary | ICD-10-CM

## 2019-12-26 LAB — CBC
HCT: 35.9 % — ABNORMAL LOW (ref 36.0–46.0)
Hemoglobin: 11.3 g/dL — ABNORMAL LOW (ref 12.0–15.0)
MCH: 30.3 pg (ref 26.0–34.0)
MCHC: 31.5 g/dL (ref 30.0–36.0)
MCV: 96.2 fL (ref 80.0–100.0)
Platelets: 166 10*3/uL (ref 150–400)
RBC: 3.73 MIL/uL — ABNORMAL LOW (ref 3.87–5.11)
RDW: 13.1 % (ref 11.5–15.5)
WBC: 5.6 10*3/uL (ref 4.0–10.5)
nRBC: 0 % (ref 0.0–0.2)

## 2019-12-26 LAB — BASIC METABOLIC PANEL
Anion gap: 12 (ref 5–15)
BUN: 13 mg/dL (ref 8–23)
CO2: 27 mmol/L (ref 22–32)
Calcium: 8.9 mg/dL (ref 8.9–10.3)
Chloride: 101 mmol/L (ref 98–111)
Creatinine, Ser: 1.24 mg/dL — ABNORMAL HIGH (ref 0.44–1.00)
GFR calc Af Amer: 50 mL/min — ABNORMAL LOW (ref >60)
GFR calc non Af Amer: 43 mL/min — ABNORMAL LOW (ref >60)
Glucose, Bld: 96 mg/dL (ref 70–99)
Potassium: 4.3 mmol/L (ref 3.5–5.1)
Sodium: 140 mmol/L (ref 135–145)

## 2019-12-26 MED ORDER — HYDROCODONE-ACETAMINOPHEN 5-325 MG PO TABS: 1.0000 | ORAL_TABLET | Freq: Four times a day (QID) | ORAL | 0 refills | Status: DC | PRN

## 2019-12-26 MED ORDER — METHOCARBAMOL 500 MG PO TABS: 500.0000 mg | ORAL_TABLET | Freq: Three times a day (TID) | ORAL | 0 refills | Status: DC | PRN

## 2019-12-26 MED FILL — METHOCARBAMOL 500 MG TABS: 500 | 10 days supply | Qty: 30 | Fill #0

## 2019-12-26 MED FILL — HYDROCODON-APAP 5-325: 5-325 | 8 days supply | Qty: 30 | Fill #0

## 2019-12-26 NOTE — Care Management Important Message (Signed)
Important Message  Patient Details  Name: SAMERIA MORSS MRN: [id number] Date of Birth: 1946/07/19   Medicare Important Message Given:  Yes     Renie Ora 12/26/2019, 9:15 AM

## 2019-12-26 NOTE — Consult Note (Signed)
[facility] CM Inpatient Consult   12/26/2019  [name] [name] 10-22-1945 [id number]   Patient screened for disposition needs and  potential [facility] Care Management services for member in the [facility] Accountable Care Organization [ACO].   Review of patient's medical record reveals patient is from History and Physical Dr. Earlie Lou on 12/22/19 which includes but not limited to as follows:  [name] [name] is a 74 y.o. female with medical history significant of COPD, hypertension, osteoarthritis, osteoporosis, anxiety with depression, carotid stenosis, CVA status post residual right-sided weakness, diastolic heart failure, mild aortic stenosis former smoker with peripheral vascular disease, hyperlipidemia and spinal compression fractures, morbidly obese coming to the hospital with generalized fatigue weakness and muscle spasms since she got her second more than a vaccine on March 23. Notes reviewed for transition of care needs and patient is currently being recommended for a skilled nursing facility level of care form PT evaluation.  Primary Care Provider is:  [name], MD  [facility], this office is listed to provide the [facility] follow up.  Plan:  Continue to follow progress and disposition to assess for post hospital care management needs.    Patient for home with home health.  Will have [facility] nurse assign for additional support and follow up.   For questions contact:   Charlesetta Shanks, RN BSN CCM [facility]  [phone] business mobile phone Toll free office [phone]  Fax number: [phone] [email or web address]

## 2019-12-26 NOTE — Discharge Instructions (Signed)
Acute Back Pain, Adult Acute back pain is sudden and usually short-lived. It is often caused by an injury to the muscles and tissues in the back. The injury may result from:  A muscle or ligament getting overstretched or torn (strained). Ligaments are tissues that connect bones to each other. Lifting something improperly can cause a back strain.  Wear and tear (degeneration) of the spinal disks. Spinal disks are circular tissue that provides cushioning between the bones of the spine (vertebrae).  Twisting motions, such as while playing sports or doing yard work.  A hit to the back.  Arthritis. You may have a physical exam, lab tests, and imaging tests to find the cause of your pain. Acute back pain usually goes away with rest and home care. Follow these instructions at home: Managing pain, stiffness, and swelling  Take over-the-counter and prescription medicines only as told by your health care provider.  Your health care provider may recommend applying ice during the first 24-48 hours after your pain starts. To do this: ? Put ice in a plastic bag. ? Place a towel between your skin and the bag. ? Leave the ice on for 20 minutes, 2-3 times a day.  If directed, apply heat to the affected area as often as told by your health care provider. Use the heat source that your health care provider recommends, such as a moist heat pack or a heating pad. ? Place a towel between your skin and the heat source. ? Leave the heat on for 20-30 minutes. ? Remove the heat if your skin turns bright red. This is especially important if you are unable to feel pain, heat, or cold. You have a greater risk of getting burned. Activity   Do not stay in bed. Staying in bed for more than 1-2 days can delay your recovery.  Sit up and stand up straight. Avoid leaning forward when you sit, or hunching over when you stand. ? If you work at a desk, sit close to it so you do not need to lean over. Keep your chin tucked  in. Keep your neck drawn back, and keep your elbows bent at a right angle. Your arms should look like the letter "L." ? Sit high and close to the steering wheel when you drive. Add lower back (lumbar) support to your car seat, if needed.  Take short walks on even surfaces as soon as you are able. Try to increase the length of time you walk each day.  Do not sit, drive, or stand in one place for more than 30 minutes at a time. Sitting or standing for long periods of time can put stress on your back.  Do not drive or use heavy machinery while taking prescription pain medicine.  Use proper lifting techniques. When you bend and lift, use positions that put less stress on your back: ? Bend your knees. ? Keep the load close to your body. ? Avoid twisting.  Exercise regularly as told by your health care provider. Exercising helps your back heal faster and helps prevent back injuries by keeping muscles strong and flexible.  Work with a physical therapist to make a safe exercise program, as recommended by your health care provider. Do any exercises as told by your physical therapist. Lifestyle  Maintain a healthy weight. Extra weight puts stress on your back and makes it difficult to have good posture.  Avoid activities or situations that make you feel anxious or stressed. Stress and anxiety increase muscle   tension and can make back pain worse. Learn ways to manage anxiety and stress, such as through exercise. General instructions  Sleep on a firm mattress in a comfortable position. Try lying on your side with your knees slightly bent. If you lie on your back, put a pillow under your knees.  Follow your treatment plan as told by your health care provider. This may include: ? Cognitive or behavioral therapy. ? Acupuncture or massage therapy. ? Meditation or yoga. Contact a health care provider if:  You have pain that is not relieved with rest or medicine.  You have increasing pain going down  into your legs or buttocks.  Your pain does not improve after 2 weeks.  You have pain at night.  You lose weight without trying.  You have a fever or chills. Get help right away if:  You develop new bowel or bladder control problems.  You have unusual weakness or numbness in your arms or legs.  You develop nausea or vomiting.  You develop abdominal pain.  You feel faint. Summary  Acute back pain is sudden and usually short-lived.  Use proper lifting techniques. When you bend and lift, use positions that put less stress on your back.  Take over-the-counter and prescription medicines and apply heat or ice as directed by your health care provider. This information is not intended to replace advice given to you by your health care provider. Make sure you discuss any questions you have with your health care provider. Document Revised: 01/01/2019 Document Reviewed: 04/26/2017 Elsevier Patient Education  2020 [employer]  

## 2019-12-26 NOTE — TOC Transition Note (Addendum)
Transition of Care [facility]) - CM/SW Discharge Note Donn Pierini RN, BSN Transitions of Care Unit 4E- RN Case Manager [phone]   Patient Details  Name: [name] [name] MRN: [id number] Date of Birth: Feb 06, 1946  Transition of [facility]) CM/SW Contact:  Darrold Span, RN Phone Number: 12/26/2019, 1:05 PM   Clinical Narrative:    Pt stable for transition home today, back brace at bedside. Order for HHPT has been placed. CM spoke with pt and her daughter at bedside- pt has home 02 that she uses at night provided by [employer]. She has needed DME at home. Daughter to provided transportation home. Pt lives with family and will have assistance at home. List provided to pt for [facility] choice Per CMS guidelines from medicare.gov website with star ratings (copy placed in shadow chart)- per pt/daughter choice would like to use [facility] for needed [facility] services-  Call made to Britney with [facility] for HHPT referral-  Referral has been accepted and they will f/u for start of care visit.    Final next level of care: Home w Home Health Services Barriers to Discharge: No Barriers Identified   Patient Goals and CMS Choice Patient states their goals for this hospitalization and ongoing recovery are:: to get to where I can get around and do for myself again CMS Medicare.gov Compare Post Acute Care list provided to:: Patient Choice offered to / list presented to : Patient  Discharge Placement               Home with [facility]        Discharge Plan and Services   Discharge Planning Services: CM Consult Post Acute Care Choice: Home Health          DME Arranged: N/A DME Agency: NA       HH Arranged: PT HH Agency: [facility] Date HH Agency Contacted: 12/26/19 Time HH Agency Contacted: [phone] Representative spoke with at [facility] Agency: britney  Social Determinants of Health (SDOH) Interventions     Readmission Risk Interventions Readmission Risk Prevention Plan 12/26/2019  Transportation  Screening Complete  PCP or Specialist Appt within 5-7 Days Complete  Home Care Screening Complete  Medication Review (RN CM) Complete  Some recent data might be hidden

## 2019-12-26 NOTE — Telephone Encounter (Signed)
New message     [facility] appointment scheduled for 01-02-20 at 1:30 with Eula Listen per Dot Lanes

## 2019-12-26 NOTE — Telephone Encounter (Signed)
Patient still currently in the hospital today.  Will need to call tomorrow.

## 2019-12-26 NOTE — Discharge Summary (Signed)
Physician Discharge Summary  [name] [name]:[id number] DOB: 1946-09-18 DOA: 12/22/2019  PCP: [name], MD  Admit date: 12/22/2019 Discharge date: 12/26/2019  Time spent: 45 minutes  Recommendations for Outpatient Follow-up:  Patient will be discharged to home with home health physical therapy.  Patient will need to follow up with primary care provider within one week of discharge, repeat CBC and BMP.  Follow up with cardiology. Follow up with Dr. [name], neurosurgery, in 4-6 weeks. Patient should continue medications as prescribed.  Patient should follow a heart healthy diet.   Discharge Diagnoses:  Elevated troponin Aortic stenosis Chronic diastolic CHF Essential hypertension History of CVA/carotid stenosis/peripheral vascular disease/hyperlipidemia Acute kidney injury on chronic kidney disease, stage IIIa Anemia of chronic disease Pyuria COPD Anxiety/depression Hypothyroidism  Generalized weakness/debility Spasms/Back pain  Discharge Condition: Stable  Diet recommendation: heart healthy  Filed Weights   12/22/19 1213  Weight: 74.8 kg    History of present illness:  On 12/22/2019 by Dr. Tish Men Hutchinsis a 74 y.o.femalewith medical history significant ofCOPD, hypertension, osteoarthritis, osteoporosis, anxiety with depression, carotid stenosis, CVA status post residual right-sided weakness, diastolic heart failure, mild aortic stenosis former smoker with peripheral vascular disease, hyperlipidemia and spinal compression fractures, morbidly obese coming to the hospital with generalized fatigue weakness and muscle spasms since she got her second more than a vaccine on March 23. Patient has had some shortness of breath and lower extremity edema within the last month. She is known to have diastolic heart failure. She denied any significant chest pain but generally some discomfort. Patient was noted to have elevated troponins in the ER. No  evidence of PE. Had transient ST changes on the EKG. Patient is suspected to have had some type of cardiomyopathy and is being admitted to the hospital for treatment.[name]  Hospital Course:  Elevated troponin -Troponin on admission 403, trending downward to 208 -Echocardiogram showed an EF of 60 to 65%.  LV diastolic parameters indeterminate. -Cardiology consulted and appreciated -Patient was placed on heparin however this was discontinued -Cardiology does not feel this is active ischemia as troponins have been mild with flat elevation -Continue aspirin, statin, Plavix  Aortic stenosis -Mean gradient 14 mmHg consistent with mild left ear, follow-up as an outpatient  Chronic diastolic CHF -Appears to be euvolemic -BNP 71 -Continue low-dose Lasix and low-sodium diet  Essential hypertension -BP has been low, benazepril held  History of CVA/carotid stenosis/peripheral vascular disease/hyperlipidemia -With residual right-sided weakness -Continue statin, Plavix, aspirin, Zetia  Acute kidney injury on chronic kidney disease, stage IIIa -Baseline creatinine approximately 1, was 1.5 on admission -Improved, 1.2 -Benazepril held -Repeat BMP in one week  Anemia of chronic disease -Hemoglobin appears stable  Pyuria -Urine culture shows 40K E faecalis -Patient denies dysuria or increased urinary frequency or other UTI symptoms -On chronic TMP at home  COPD -Currently not in exacerbation.  Stable. -Continue albuterol as needed  Anxiety/depression -Continue Zoloft, trazodone  Hypothyroidism  -Continue Synthroid  Generalized weakness/debility -PT and OT initially recommended SNF; now recommending home health  Spasms/Back pain -Discussed with patient this morning, tells me that she has back spasms. -Has been ongoing for 1 week since fall at home. -Change on 12/24/2019 showing osteopenia limits detection of nondisplaced fractures.  Old compression fractures L1, T11  vertebral bodies.  L4 vertebral augmentation.  High clinical suspicion for occult fracture involving the lumbar spine, MRI recommended. -MRI of the lumbar spine: Acute/subacute T10 vertebral body fracture.  No osseous retropulsion or nerve root encroachment.  Healed L4 compression deformity.  Stable chronic T11 and L1 compression fractures. -Continue muscle relaxers and pain control -Discussed with Dr. [name], neurosurgery, recommended no surgery at this time. Follow up was an outpatient in 4-6 weeks. Can try TSLO brace.  Consultants Cardiology  Procedures  Echocardiogram  Discharge Exam: Vitals:   12/26/19 0300 12/26/19 0839  BP:  139/68  Pulse: 78 92  Resp: 18 (!) 9  Temp:  98.6 F (37 C)  SpO2: 95% 95%   Exam  General: Well developed, well nourished, NAD, appears stated age  HEENT: NCAT, mucous membranes moist.   Cardiovascular: S1 S2 auscultated, RRR, no murmur  Respiratory: Clear to auscultation bilaterally   Abdomen: Soft, nontender, nondistended, + bowel sounds  Extremities: warm dry without cyanosis clubbing. Mild LE edema  Neuro: AAOx3, nonfocal  Psych: Appropriate mood and affect  Discharge Instructions Discharge Instructions    Discharge instructions   Complete by: As directed    Patient will be discharged to home with home health physical therapy.  Patient will need to follow up with primary care provider within one week of discharge, repeat CBC and BMP.  Follow up with cardiology. Follow up with Dr. [name], neurosurgery, in 4-6 weeks. Patient should continue medications as prescribed.  Patient should follow a heart healthy diet.     Allergies as of 12/26/2019      Reactions   Doxycycline Nausea Only, Other (See Comments)   Sig GI upset   Fluticasone-salmeterol Anxiety      Medication List    STOP taking these medications   benazepril 5 MG tablet Commonly known as: LOTENSIN     TAKE these medications   acetaminophen 500 MG  tablet Commonly known as: TYLENOL Take 1 tablet (500 mg total) by mouth every 6 (six) hours as needed for headache. What changed: reasons to take this   albuterol 108 (90 Base) MCG/ACT inhaler Commonly known as: VENTOLIN HFA Inhale 2 puffs into the lungs every 6 (six) hours as needed for wheezing or shortness of breath.   aspirin 81 MG EC tablet Commonly known as: Aspirin 81 Take 1 tablet (81 mg total) by mouth daily. Swallow whole.   atorvastatin 40 MG tablet Commonly known as: LIPITOR TAKE 1 TABLET EVERY DAY   AZO CRANBERRY URINARY TRACT PO Take 1 tablet by mouth in the morning and at bedtime.   clopidogrel 75 MG tablet Commonly known as: PLAVIX TAKE 1 TABLET (75 MG TOTAL) BY MOUTH DAILY.   ezetimibe 10 MG tablet Commonly known as: Zetia Take 1 tablet (10 mg total) by mouth daily.   fluticasone-salmeterol 115-21 MCG/ACT inhaler Commonly known as: Advair HFA Inhale 2 puffs into the lungs 2 (two) times daily. Rinse mouth after use.   furosemide 40 MG tablet Commonly known as: LASIX Take 20 mg by mouth daily.   gabapentin 100 MG capsule Commonly known as: NEURONTIN Take 1 capsule (100 mg total) by mouth at bedtime.   HYDROcodone-acetaminophen 5-325 MG tablet Commonly known as: NORCO/VICODIN Take 1 tablet by mouth every 6 (six) hours as needed for moderate pain (If not controlled by tramadol).   levothyroxine 50 MCG tablet Commonly known as: SYNTHROID TAKE 1 TABLET  DAILY BEFORE BREAKFAST.   methocarbamol 500 MG tablet Commonly known as: ROBAXIN Take 1 tablet (500 mg total) by mouth every 8 (eight) hours as needed for muscle spasms.   MULTIVITAMIN PO Take 1 tablet by mouth daily.   ondansetron 4 MG tablet Commonly known as: Zofran Take 1 tablet (  4 mg total) by mouth every 8 (eight) hours as needed for nausea or vomiting.   OXYGEN Inhale 2 L/min into the lungs See admin instructions. Inhale 2 L/min of oxygen at bedtime and during the day as needed for  shortness of breath   pantoprazole 40 MG tablet Commonly known as: PROTONIX Take 1 tablet (40 mg total) by mouth daily. For 2 weeks then as needed What changed:   when to take this  additional instructions   Potassium Chloride ER 20 MEQ Tbcr TAKE 2 TABLETS BY MOUTH AS NEEDED (WHENEVER  YOU  TAKE  LASIX) What changed: See the new instructions.   sertraline 100 MG tablet Commonly known as: ZOLOFT TAKE 1 TABLET EVERY DAY   traMADol 50 MG tablet Commonly known as: ULTRAM TAKE 1 TO 2 TABLETS BY MOUTH THREE TIMES DAILY AS NEEDED FOR MODERATE PAIN What changed: See the new instructions.   traZODone 100 MG tablet Commonly known as: DESYREL Take 1.5 tablets (150 mg total) by mouth at bedtime.   trimethoprim 100 MG tablet Commonly known as: TRIMPEX Take 1 tablet (100 mg total) by mouth daily. Take daily to prevent UTI   Vitamin D3 25 MCG (1000 UT) Caps Take 1,000 Units by mouth daily.   Vitamin D3 125 MCG (5000 UT) Caps Take 5,000 Units by mouth daily.   ZINC COLD THERAPY PO Take 1 tablet by mouth daily.      Allergies  Allergen Reactions  . Doxycycline Nausea Only and Other (See Comments)    Sig GI upset  . Fluticasone-Salmeterol Anxiety   Follow-up Information    Jadene Pierini, MD. Schedule an appointment as soon as possible for a visit in 4 week(s).   Specialty: Neurosurgery Why: Hospital follow up Contact information: [address] [place] [address] [phone]        Pricilla Riffle, MD. Schedule an appointment as soon as possible for a visit in 1 week(s).   Specialty: Cardiology Why: Hospital follow up Contact information: [address] [place] [address] [phone]        [name], MD. Schedule an appointment as soon as possible for a visit in 1 week(s).   Specialty: Family Medicine Why: Hospital follow up Contact information: [address] [place] [address] [phone]             The results of significant diagnostics from this hospitalization (including imaging, microbiology, ancillary and laboratory) are listed below for reference.    Significant Diagnostic Studies: DG Thoracic Spine 2 View  Result Date: 12/24/2019 CLINICAL DATA:  Back pain.  History of thoracic compression fracture EXAM: THORACIC SPINE 2 VIEWS COMPARISON:  CT 2 days prior FINDINGS: The previously demonstrated T10 vertebral body fractures better visualized on the prior study. No additional compression fracture identified. There is an old T11 compression fracture. There is diffuse osteopenia. Mild degenerative changes are noted. Aortic calcifications are noted. IMPRESSION: 1. Previously demonstrated T10 compression fracture is better visualized on recent CT. 2. There is diffuse osteopenia which limits detection of nondisplaced fractures. If there is high clinical suspicion for an additional acute fracture of the thoracic spine, follow-up with MRI is recommended. Electronically Signed   By: Katherine Mantle M.D.   On: 12/24/2019 22:07   DG Thoracic Spine W/Swimmers  Result Date: 12/20/2019 CLINICAL DATA:  Larey Seat, back pain EXAM: THORACIC SPINE - 3 VIEWS COMPARISON:  01/24/2019 FINDINGS: Frontal and lateral views of the thoracic spine are obtained. Chronic  T11 compression deformity again noted. No acute bony abnormalities. Alignment is stable. Paraspinal soft tissues are unremarkable. Atherosclerosis of the thoracic aorta unchanged. IMPRESSION: 1. Stable T11 compression deformity.  No acute bony abnormality. Electronically Signed   By: Sharlet Salina M.D.   On: 12/20/2019 16:59   DG Lumbar Spine 2-3 Views  Result Date: 12/24/2019 CLINICAL DATA:  Upper back pain. EXAM: LUMBAR SPINE - 2-3 VIEW COMPARISON:  December 20, 2019 FINDINGS: The patient is status post prior L4 vertebral augmentation. There is diffuse osteopenia which limits detection of nondisplaced fractures. There is a compression fracture of the L1  and T11 vertebral bodies which appears chronic. Mild-to-moderate multilevel degenerative changes are noted throughout the visualized lumbar spine. Advanced aortic calcifications are noted. IMPRESSION: 1. Osteopenia limits detection of nondisplaced fractures. 2. Old compression fractures as detailed above. The patient is status post prior L4 vertebral augmentation. 3. If there is high clinical suspicion for an occult fracture involving the lumbar spine, follow-up with MRI is recommended given the patient's diffuse osteopenia. 4.  Aortic Atherosclerosis (ICD10-I70.0). Electronically Signed   By: Katherine Mantle M.D.   On: 12/24/2019 22:05   DG Lumbar Spine Complete  Result Date: 12/20/2019 CLINICAL DATA:  Larey Seat, back pain EXAM: LUMBAR SPINE - COMPLETE 4+ VIEW COMPARISON:  10/30/2017 FINDINGS: Frontal, bilateral oblique, lateral views of the lumbar spine are obtained. There are 5 non-rib-bearing lumbar type vertebral bodies in stable alignment. Prior L4 compression deformity and vertebral augmentation. Chronic T11 and L1 compression deformities. No new fractures. Mild spondylosis at the lumbosacral junction. There is mild diffuse facet hypertrophy. Extensive atherosclerosis of the aorta again noted. Visualized portions of the bony pelvis are unremarkable. IMPRESSION: 1. Chronic compression deformities of T11, L1, and L4. 2. No acute bony abnormality. 3. Stable lower lumbar spondylosis and facet hypertrophy. Electronically Signed   By: Sharlet Salina M.D.   On: 12/20/2019 16:58   CT HEAD WO CONTRAST  Result Date: 12/24/2019 CLINICAL DATA:  Mental status change. Confusion. EXAM: CT HEAD WITHOUT CONTRAST TECHNIQUE: Contiguous axial images were obtained from the base of the skull through the vertex without intravenous contrast. COMPARISON:  12/22/2019 FINDINGS: Brain: There is no evidence of acute infarct, intracranial hemorrhage, mass, midline shift, or extra-axial fluid collection. There is advanced cerebral  atrophy which is particularly severe in the parietal lobes. Hypodensities in the cerebral white matter bilaterally are unchanged and nonspecific but compatible with mild chronic small vessel ischemic disease. Small chronic infarcts are again seen in the cerebellum bilaterally. Vascular: Calcified atherosclerosis at the skull base. No hyperdense vessel. Skull: No fracture suspicious osseous lesion. Sinuses/Orbits: Clear paranasal sinuses. Small chronic mastoid effusions. Bilateral cataract extraction. Other: None. IMPRESSION: 1. No evidence of acute intracranial abnormality. 2. Advanced cerebral atrophy. Electronically Signed   By: Sebastian Ache M.D.   On: 12/24/2019 20:47   CT Head Wo Contrast  Result Date: 12/22/2019 CLINICAL DATA:  Altered mental status EXAM: CT HEAD WITHOUT CONTRAST TECHNIQUE: Contiguous axial images were obtained from the base of the skull through the vertex without intravenous contrast. COMPARISON:  CT head dated 01/24/2019 FINDINGS: Brain: No evidence of acute infarction, hemorrhage, hydrocephalus, extra-axial collection or mass lesion/mass effect. There is moderate cerebral volume loss with associated ex vacuo dilatation. Vascular: There are vascular calcifications in the carotid siphons. Skull: Normal. Negative for fracture or focal lesion. Sinuses/Orbits: No acute finding. Other: None. IMPRESSION: No acute intracranial process. Electronically Signed   By: Romona Curls M.D.   On: 12/22/2019 14:16  CT Angio Chest PE W/Cm &/Or Wo Cm  Result Date: 12/22/2019 CLINICAL DATA:  Weakness and chest pain. EXAM: CT ANGIOGRAPHY CHEST WITH CONTRAST TECHNIQUE: Multidetector CT imaging of the chest was performed using the standard protocol during bolus administration of intravenous contrast. Multiplanar CT image reconstructions and MIPs were obtained to evaluate the vascular anatomy. CONTRAST:  50mL OMNIPAQUE IOHEXOL 350 MG/ML SOLN COMPARISON:  11/14/2017 FINDINGS: Cardiovascular: Contrast  injection is sufficient to demonstrate satisfactory opacification of the pulmonary arteries to the segmental level. There is no pulmonary embolus. The main pulmonary artery is within normal limits for size. There is no CT evidence of acute right heart strain. There are atherosclerotic changes of the thoracic aorta without evidence for aneurysm. Heart size is enlarged. There are coronary artery calcifications without evidence for pericardial effusion. Mediastinum/Nodes: --No mediastinal or hilar lymphadenopathy. --No axillary lymphadenopathy. --No supraclavicular lymphadenopathy. --Normal thyroid gland. --there is some mild diffuse esophageal wall thickening. Lungs/Pleura: There are emphysematous changes bilaterally. There is atelectasis at the lung bases. There is debris within the trachea. There is some bronchial wall thickening and mucus plugging at the lung bases. There is a small right-sided pleural effusion. Upper Abdomen: No acute abnormality. Musculoskeletal: There is a slight irregularity of the T10 superior endplate, new since 2019. there is approximately 15% height loss anteriorly. There is no retropulsion. Review of the MIP images confirms the above findings. IMPRESSION: 1. No acute pulmonary embolism. 2. Findings suspicious for an acute or subacute mild compression fracture of the T10 vertebral body. 3. Bibasilar atelectasis with a trace right-sided pleural effusion. 4. Mild diffuse esophageal wall thickening is nonspecific but can be seen in patients with esophagitis. 5. Mild mucous plugging and bronchial wall thickening at the lung bases suggestive of reactive or infectious bronchiolitis. Aortic Atherosclerosis (ICD10-I70.0) and Emphysema (ICD10-J43.9). Electronically Signed   By: Katherine Mantle M.D.   On: 12/22/2019 17:14   MR LUMBAR SPINE WO CONTRAST  Result Date: 12/25/2019 CLINICAL DATA:  Generalized weakness and lethargy. History of compression fractures. EXAM: MRI LUMBAR SPINE WITHOUT  CONTRAST TECHNIQUE: Multiplanar, multisequence MR imaging of the lumbar spine was performed. No intravenous contrast was administered. COMPARISON:  Radiographs 12/24/2019, MRI 09/30/2017 and CT 09/30/2017. FINDINGS: Segmentation: Conventional anatomy assumed, with the last open disc space designated L5-S1.Concordant with previous imaging. Alignment: Stable and near anatomic. There is a trace retrolisthesis at L1-2. Vertebrae: Interval spinal augmentation at L4. The compression fracture at that level has healed without residual marrow edema or significant progression. There are chronic fractures involving the superior endplate of T11 and the inferior endplate of L1 which are stable. The sagittal images extend from the mid T10 level through the lower sacrum. There is bone marrow edema throughout the visualized T10 vertebral body consistent with a fracture, incompletely visualized. No osseous retropulsion. This level is not included on the axial images. The visualized sacroiliac joints appear unremarkable. Conus medullaris: Extends to the upper L1 level and appears normal. Paraspinal and other soft tissues: No significant paraspinal findings. There are small bilateral renal cysts and sigmoid colon diverticular changes. Disc levels: T11-12: Only imaged in the sagittal plane. A small central disc protrusion is grossly stable without cord deformity or foraminal compromise. T12-L1: Stable small central disc protrusion. No spinal stenosis or nerve root encroachment. L1-2: Stable mild disc bulging. No spinal stenosis or nerve root encroachment. L2-3: Normal interspace. L3-4: Stable mild disc bulging and facet hypertrophy. No spinal stenosis or nerve root encroachment. L4-5: Stable mild disc bulging and facet hypertrophy. No  spinal stenosis or nerve root encroachment. L5-S1: The disc appears normal. Mild bilateral facet hypertrophy. No spinal stenosis or nerve root encroachment. IMPRESSION: 1. Acute/subacute T10 vertebral  body fracture, incompletely visualized by this study. No osseous retropulsion or nerve root encroachment identified. 2. Healed L4 compression deformity status post spinal augmentation. Stable chronic T11 and L1 compression fractures. 3. Stable mild spondylosis with small disc protrusions as described. No significant spinal stenosis or nerve root encroachment. Electronically Signed   By: Carey Bullocks M.D.   On: 12/25/2019 09:53   DG Chest Port 1 View  Result Date: 12/22/2019 CLINICAL DATA:  Cough and body aches. EXAM: PORTABLE CHEST 1 VIEW COMPARISON:  January 24, 2019 FINDINGS: The mediastinal contour and cardiac silhouette are stable. There is no focal infiltrate, pulmonary edema, or pleural effusion. No acute abnormality is identified in the osseous structures. IMPRESSION: No active disease. Electronically Signed   By: Sherian Rein M.D.   On: 12/22/2019 12:45   ECHOCARDIOGRAM COMPLETE  Result Date: 12/23/2019    ECHOCARDIOGRAM REPORT   Patient Name:   TAYLYNN EASTIN Custer Date of Exam: 12/23/2019 Medical Rec #:  [id number]         Height:       59.0 in Accession #:    [id number]        Weight:       165.0 lb Date of Birth:  03-Nov-1945        BSA:          1.700 m Patient Age:    73 years          BP:           116/52 mmHg Patient Gender: F                 HR:           77 bpm. Exam Location:  Inpatient Procedure: 2D Echo Indications:    Cardiomyopathy-Unspecified 425.9 / I42.9  History:        Patient has prior history of Echocardiogram examinations, most                 recent 07/31/2018. COPD, Signs/Symptoms:Shortness of Breath; Risk                 Factors:Former Smoker, Dyslipidemia and Hypertension. Chronic                 respirtatory failure                 CKD.  Sonographer:    Leeroy Bock Turrentine Referring Phys: [id number] Rometta Emery  Sonographer Comments: Suboptimal subcostal window. Image acquisition challenging due to patient body habitus. IMPRESSIONS  1. Left ventricular ejection fraction, by  estimation, is 60 to 65%. The left ventricle has normal function. The left ventricle has no regional wall motion abnormalities. There is mild left ventricular hypertrophy. Left ventricular diastolic parameters are indeterminate.  2. Right ventricular systolic function is mildly reduced. The right ventricular size is normal. Tricuspid regurgitation signal is inadequate for assessing PA pressure.  3. The mitral valve is normal in structure. No evidence of mitral valve regurgitation.  4. The aortic valve is abnormal. Moderately calcified. Aortic valve regurgitation is mild. Mild to moderate aortic valve stenosis. Vmax 2.3 m/s, MG 14 mmHg, AVA 1.2 cm^2, DI 0.43 FINDINGS  Left Ventricle: Left ventricular ejection fraction, by estimation, is 60 to 65%. The left ventricle has normal function. The left ventricle has no regional wall motion abnormalities. The  left ventricular internal cavity size was normal in size. There is  mild left ventricular hypertrophy. Left ventricular diastolic parameters are indeterminate. Right Ventricle: The right ventricular size is normal. Right vetricular wall thickness was not assessed. Right ventricular systolic function is mildly reduced. Tricuspid regurgitation signal is inadequate for assessing PA pressure. Left Atrium: Left atrial size was normal in size. Right Atrium: Right atrial size was normal in size. Pericardium: Trivial pericardial effusion is present. Presence of pericardial fat pad. Mitral Valve: The mitral valve is normal in structure. No evidence of mitral valve regurgitation. Tricuspid Valve: The tricuspid valve is normal in structure. Tricuspid valve regurgitation is not demonstrated. Aortic Valve: The aortic valve is abnormal. Aortic valve regurgitation is mild. Aortic regurgitation PHT measures 360 msec. Mild to moderate aortic stenosis is present. There is moderate calcification of the aortic valve. Aortic valve mean gradient measures 11.2 mmHg. Aortic valve peak gradient  measures 18.1 mmHg. Aortic valve area, by VTI measures 1.37 cm. Pulmonic Valve: The pulmonic valve was not well visualized. Pulmonic valve regurgitation is trivial. Aorta: The aortic root is normal in size and structure. IAS/Shunts: The interatrial septum was not well visualized.  LEFT VENTRICLE PLAX 2D LVIDd:         4.10 cm  Diastology LVIDs:         2.90 cm  LV e' lateral:   8.05 cm/s LV PW:         1.10 cm  LV E/e' lateral: 10.0 LV IVS:        1.10 cm  LV e' medial:    4.79 cm/s LVOT diam:     1.90 cm  LV E/e' medial:  16.7 LV SV:         62 LV SV Index:   37 LVOT Area:     2.84 cm  RIGHT VENTRICLE RV S prime:     8.05 cm/s LEFT ATRIUM             Index       RIGHT ATRIUM           Index LA diam:        3.60 cm 2.12 cm/m  RA Area:     16.20 cm LA Vol (A2C):   50.0 ml 29.42 ml/m RA Volume:   42.90 ml  25.24 ml/m LA Vol (A4C):   38.8 ml 22.83 ml/m LA Biplane Vol: 45.0 ml 26.48 ml/m  AORTIC VALVE AV Area (Vmax):    1.30 cm AV Area (Vmean):   1.18 cm AV Area (VTI):     1.37 cm AV Vmax:           212.60 cm/s AV Vmean:          160.600 cm/s AV VTI:            0.454 m AV Peak Grad:      18.1 mmHg AV Mean Grad:      11.2 mmHg LVOT Vmax:         97.30 cm/s LVOT Vmean:        66.900 cm/s LVOT VTI:          0.220 m LVOT/AV VTI ratio: 0.48 AI PHT:            360 msec  AORTA Ao Root diam: 3.00 cm MITRAL VALVE MV Area (PHT): 3.12 cm    SHUNTS MV Decel Time: 243 msec    Systemic VTI:  0.22 m MV E velocity: 80.10 cm/s  Systemic Diam: 1.90 cm  MV A velocity: 94.70 cm/s MV E/A ratio:  0.85 Epifanio Lesches MD Electronically signed by Epifanio Lesches MD Signature Date/Time: 12/23/2019/11:25:33 PM    Final     Microbiology: Recent Results (from the past 240 hour(s))  Urine culture     Status: Abnormal   Collection Time: 12/22/19  1:15 PM   Specimen: Urine, Clean Catch  Result Value Ref Range Status   Specimen Description URINE, CLEAN CATCH  Final   Special Requests   Final    NONE Performed at [facility], [address] [place], [place] [address]    Culture 40,000 COLONIES/mL ENTEROCOCCUS FAECALIS (A)  Final   Report Status 12/24/2019 FINAL  Final   Organism ID, Bacteria ENTEROCOCCUS FAECALIS (A)  Final      Susceptibility   Enterococcus faecalis - MIC*    AMPICILLIN <=2 SENSITIVE Sensitive     NITROFURANTOIN <=16 SENSITIVE Sensitive     VANCOMYCIN 1 SENSITIVE Sensitive     * 40,000 COLONIES/mL ENTEROCOCCUS FAECALIS  SARS CORONAVIRUS 2 (TAT 6-24 HRS) Nasopharyngeal Nasopharyngeal Swab     Status: None   Collection Time: 12/22/19  3:15 PM   Specimen: Nasopharyngeal Swab  Result Value Ref Range Status   SARS Coronavirus 2 NEGATIVE NEGATIVE Final    Comment: (NOTE) SARS-CoV-2 target nucleic acids are NOT DETECTED. The SARS-CoV-2 RNA is generally detectable in upper and lower respiratory specimens during the acute phase of infection. Negative results do not preclude SARS-CoV-2 infection, do not rule out co-infections with other pathogens, and should not be used as the sole basis for treatment or other patient management decisions. Negative results must be combined with clinical observations, patient history, and epidemiological information. The expected result is Negative. Fact Sheet for Patients: HairSlick.no Fact Sheet for Healthcare Providers: quierodirigir.com This test is not yet approved or cleared by the [place] FDA and  has been authorized for detection and/or diagnosis of SARS-CoV-2 by FDA under an Emergency Use Authorization (EUA). This EUA will remain  in effect (meaning this test can be used) for the duration of the COVID-19 declaration under Section 56 4(b)(1) of the Act, 21 U.S.C. section 360bbb-3(b)(1), unless the authorization is terminated or revoked sooner. Performed at [facility], [address] [place], [place] [address]      Labs: Basic Metabolic Panel: Recent Labs  Lab  12/22/19 1243 12/23/19 0001 12/24/19 0121 12/25/19 0213 12/26/19 0311  NA 136 140 139 137 140  K 4.7 4.6 4.1 4.2 4.3  CL 97* 102 102 100 101  CO2 28 31 25 26 27   GLUCOSE 119* 109* 100* 90 96  BUN 21 16 13 11 13   CREATININE 1.55* 1.39* 1.22* 1.08* 1.24*  CALCIUM 9.4 8.4* 8.0* 8.6* 8.9   Liver Function Tests: Recent Labs  Lab 12/22/19 1243 12/23/19 0001  AST 33 25  ALT 20 18  ALKPHOS 92 73  BILITOT 0.6 0.6  PROT 7.2 6.0*  ALBUMIN 3.6 2.9*   Recent Labs  Lab 12/22/19 1243  LIPASE 31   No results for input(s): AMMONIA in the last 168 hours. CBC: Recent Labs  Lab 12/22/19 1243 12/23/19 0001 12/24/19 0121 12/25/19 0213 12/26/19 0311  WBC 9.3 6.9 6.9 5.3 5.6  NEUTROABS 7.5  --   --   --   --   HGB 12.1 10.0* 10.1* 10.7* 11.3*  HCT 39.4 33.0* 32.9* 34.5* 35.9*  MCV 96.8 97.6 97.6 96.4 96.2  PLT 154 132* 138* 150 166   Cardiac  Enzymes: Recent Labs  Lab 12/22/19 1243  CKTOTAL 75   BNP: BNP (last 3 results) Recent Labs    01/24/19 1705 12/22/19 1243 12/24/19 0121  BNP 49.9 136.0* 71.7    ProBNP (last 3 results) Recent Labs    11/05/19 1438  PROBNP 32.0    CBG: Recent Labs  Lab 12/22/19 1228  GLUCAP 98       Signed:  Lashica Hannay  [facility] 12/26/2019, 10:01 AM

## 2019-12-26 NOTE — Progress Notes (Signed)
Progress Note  Patient Name: [name] [name] Date of Encounter: 12/26/2019  Primary Cardiologist: New  Subjective   No CP   No SOB   Inpatient Medications    Scheduled Meds: . aspirin EC  81 mg Oral Daily  . atorvastatin  40 mg Oral Daily  . cholecalciferol  5,000 Units Oral Daily  . clopidogrel  75 mg Oral Daily  . ezetimibe  10 mg Oral Daily  . furosemide  20 mg Oral Daily  . gabapentin  100 mg Oral QHS  . heparin injection (subcutaneous)  5,000 Units Subcutaneous Q8H  . levothyroxine  50 mcg Oral Q0600  . multivitamin with minerals   Oral Daily  . pantoprazole  40 mg Oral QODAY  . potassium chloride  20 mEq Oral Daily  . sertraline  100 mg Oral Daily  . traZODone  150 mg Oral QHS  . trimethoprim  100 mg Oral Daily   Continuous Infusions:  PRN Meds: acetaminophen **OR** acetaminophen, albuterol, HYDROcodone-acetaminophen, methocarbamol, ondansetron **OR** ondansetron (ZOFRAN) IV, traMADol   Vital Signs    Vitals:   12/25/19 0938 12/25/19 2012 12/26/19 0200 12/26/19 0300  BP: (!) 138/52 119/63    Pulse: 77 82 83 78  Resp: 13 17 15 18   Temp: 98.1 F (36.7 C) 99 F (37.2 C)    TempSrc: Oral Oral    SpO2: 96% 95% 94% 95%  Weight:      Height:        Intake/Output Summary (Last 24 hours) at 12/26/2019 0755 Last data filed at 12/25/2019 2200 Gross per 24 hour  Intake 660 ml  Output 300 ml  Net 360 ml    I/O  +  2.3  ? Complete    L Last 3 Weights 12/22/2019 12/20/2019 11/25/2019  Weight (lbs) 165 lb (No Data) 186 lb  Weight (kg) 74.844 kg (No Data) 84.369 kg      Telemetry     SR  - Personally Reviewed  ECG    Not done   Personally Reviewed  Physical Exam   GEN: No acute distress.   Neck: JVP is normla     Cardiac: RRR, no murmurs, rubs, or gallops.  Respiratory: Clear to auscultation bilaterally. GI: Soft, nontender, non-distended  MS:  No  LE edema Neuro:  Nonfocal  Psych: Normal affect   Labs    High Sensitivity Troponin:   Recent  Labs  Lab 12/22/19 1243 12/22/19 1529 12/23/19 0838  TROPONINIHS 403* 392* 208*      Chemistry Recent Labs  Lab 12/22/19 1243 12/22/19 1243 12/23/19 0001 12/23/19 0001 12/24/19 0121 12/25/19 0213 12/26/19 0311  NA 136   < > 140   < > 139 137 140  K 4.7   < > 4.6   < > 4.1 4.2 4.3  CL 97*   < > 102   < > 102 100 101  CO2 28   < > 31   < > 25 26 27   GLUCOSE 119*   < > 109*   < > 100* 90 96  BUN 21   < > 16   < > 13 11 13   CREATININE 1.55*   < > 1.39*   < > 1.22* 1.08* 1.24*  CALCIUM 9.4   < > 8.4*   < > 8.0* 8.6* 8.9  PROT 7.2  --  6.0*  --   --   --   --   ALBUMIN 3.6  --  2.9*  --   --   --   --  AST 33  --  25  --   --   --   --   ALT 20  --  18  --   --   --   --   ALKPHOS 92  --  73  --   --   --   --   BILITOT 0.6  --  0.6  --   --   --   --   GFRNONAA 33*   < > 38*   < > 44* 51* 43*  GFRAA 38*   < > 43*   < > 51* 59* 50*  ANIONGAP 11   < > 7   < > 12 11 12    < > = values in this interval not displayed.     Hematology Recent Labs  Lab 12/24/19 0121 12/25/19 0213 12/26/19 0311  WBC 6.9 5.3 5.6  RBC 3.37* 3.58* 3.73*  HGB 10.1* 10.7* 11.3*  HCT 32.9* 34.5* 35.9*  MCV 97.6 96.4 96.2  MCH 30.0 29.9 30.3  MCHC 30.7 31.0 31.5  RDW 13.2 13.1 13.1  PLT 138* 150 166    BNP Recent Labs  Lab 12/22/19 1243 12/24/19 0121  BNP 136.0* 71.7     DDimer  Recent Labs  Lab 12/22/19 1243  DDIMER 3.52*     Radiology      Cardiac Studies   Left ventricular ejection fraction, by estimation, is 60 to 65%. The left ventricle has normal function. The left ventricle has no regional wall motion abnormalities. There is mild left ventricular hypertrophy. Left ventricular diastolic parameters are indeterminate. 2. Right ventricular systolic function is mildly reduced. The right ventricular size is normal. Tricuspid regurgitation signal is inadequate for assessing PA pressure. 3. The mitral valve is normal in structure. No evidence of mitral valve  regurgitation. 4. The aortic valve is abnormal. Moderately calcified. Aortic valve regurgitation is mild. Mild to moderate aortic valve stenosis. Vmax 2.3 m/s, MG 14 mmHg, AVA 1.2 cm^2, DI 0.43  Patient Profile     74 y.o. female hx of CVA, diastolic CHF, COPD, mild AS who presents with weakness    Assessment & Plan      1   Elevated troponin Very mild, flat elevation of troponin    No CP   Continue medical Rx       2   Chronic diastolic CHF  I/O do not support but Clinically improved, BNP improved    I would keep on current regimen     3  Hx CVA   On ASA / plavix    4  AS   Mean gradient on echo was 14 mm Hg   Consistent with mild AS  Follow as outpt  5  HTN  BP is OK off of benazepril   This may explain some of problem on admit if BP too low .   OK to d/c     Will make sure she has outpt f/u    Please consult [email or web address] for contact info under        Signed, Dorris Carnes, MD  12/26/2019, 7:55 AM

## 2019-12-26 NOTE — Plan of Care (Signed)
  Problem: Education: Goal: Knowledge of General Education information will improve Description: Including pain rating scale, medication(s)/side effects and non-pharmacologic comfort measures Outcome: Adequate for Discharge   Problem: Acute Rehab PT Goals(only PT should resolve) Goal: Pt Will Go Supine/Side To Sit Outcome: Adequate for Discharge Goal: Patient Will Transfer Sit To/From Stand Outcome: Adequate for Discharge Goal: Pt Will Ambulate Outcome: Adequate for Discharge   Problem: Acute Rehab OT Goals (only OT should resolve) Goal: Pt. Will Perform Grooming Outcome: Adequate for Discharge Goal: Pt. Will Perform Lower Body Dressing Outcome: Adequate for Discharge Goal: Pt. Will Transfer To Toilet Outcome: Adequate for Discharge Goal: Pt/Caregiver Will Perform Home Exercise Program Outcome: Adequate for Discharge Goal: OT Additional ADL Goal #1 Outcome: Adequate for Discharge

## 2019-12-29 DIAGNOSIS — I5032 Chronic diastolic (congestive) heart failure: Secondary | ICD-10-CM | POA: Diagnosis not present

## 2019-12-29 DIAGNOSIS — J449 Chronic obstructive pulmonary disease, unspecified: Secondary | ICD-10-CM | POA: Diagnosis not present

## 2019-12-29 DIAGNOSIS — D631 Anemia in chronic kidney disease: Secondary | ICD-10-CM | POA: Diagnosis not present

## 2019-12-29 DIAGNOSIS — R8281 Pyuria: Secondary | ICD-10-CM | POA: Diagnosis not present

## 2019-12-29 DIAGNOSIS — I350 Nonrheumatic aortic (valve) stenosis: Secondary | ICD-10-CM | POA: Diagnosis not present

## 2019-12-29 DIAGNOSIS — I69351 Hemiplegia and hemiparesis following cerebral infarction affecting right dominant side: Secondary | ICD-10-CM | POA: Diagnosis not present

## 2019-12-29 DIAGNOSIS — N1831 Chronic kidney disease, stage 3a: Secondary | ICD-10-CM | POA: Diagnosis not present

## 2019-12-29 DIAGNOSIS — I130 Hypertensive heart and chronic kidney disease with heart failure and stage 1 through stage 4 chronic kidney disease, or unspecified chronic kidney disease: Secondary | ICD-10-CM | POA: Diagnosis not present

## 2019-12-29 DIAGNOSIS — I739 Peripheral vascular disease, unspecified: Secondary | ICD-10-CM | POA: Diagnosis not present

## 2019-12-31 ENCOUNTER — Other Ambulatory Visit: Payer: Self-pay

## 2019-12-31 NOTE — Telephone Encounter (Signed)
Patient contacted regarding discharge from [facility] on 12/26/2019.  Patient understands to follow up with provider Brion Aliment on 01/03/20 at 1:30 pm at [facility]. Patient understands discharge instructions? yes Patient understands medications and regiment? yes Patient understands to bring all medications to this visit? yes  Ask patient:  Are you enrolled in My Chart - yes.

## 2019-12-31 NOTE — Patient Outreach (Signed)
[facility]) Care Management  12/31/2019  [name] [name] 11/25/1945 [id number]   Referral Date: 12/26/19 Referral Source: Hospital liaison Referral Reason: Hospitalization-high risk   Outreach Attempt: Spoke with patient. She reports she is doing fair since being at home.  Patient reports she lives with her daughter and has family to assist. Patient states that she saw PCP since hospitalization and acknowledges cardiac appointment on Friday. Patient states that her daughter will be taking her. She states she has all her medications and has no problems affording.  [facility] has evaluated patient. She states she will have PT twice a week for now. Discussed with patient her back pain and osteopenia.  Educated on pain management.  She states she wears her back brace and that it feels about the same. Patient to follow up with neurology.   Discussed with patient her HTN, HF, COPD and other health issues.  Patient states she manages well.     Consent:  Discussed [facility] services and ongoing support for her chronic illnesses. Patient declined services at this time but is agreeable to receive letter and brochure for future reference.    Plan: RN CM will send letter and brochure to patient.   RN CM will close case.     Bary Leriche, RN, MSN [facility] Care Management Care Management Coordinator Direct Line [phone] Toll Free: [phone]  Fax: [phone]

## 2020-01-01 ENCOUNTER — Other Ambulatory Visit: Payer: Self-pay

## 2020-01-01 DIAGNOSIS — R8281 Pyuria: Secondary | ICD-10-CM | POA: Diagnosis not present

## 2020-01-01 DIAGNOSIS — N1831 Chronic kidney disease, stage 3a: Secondary | ICD-10-CM | POA: Diagnosis not present

## 2020-01-01 DIAGNOSIS — I5032 Chronic diastolic (congestive) heart failure: Secondary | ICD-10-CM | POA: Diagnosis not present

## 2020-01-01 DIAGNOSIS — I130 Hypertensive heart and chronic kidney disease with heart failure and stage 1 through stage 4 chronic kidney disease, or unspecified chronic kidney disease: Secondary | ICD-10-CM | POA: Diagnosis not present

## 2020-01-01 DIAGNOSIS — I69351 Hemiplegia and hemiparesis following cerebral infarction affecting right dominant side: Secondary | ICD-10-CM | POA: Diagnosis not present

## 2020-01-01 DIAGNOSIS — D631 Anemia in chronic kidney disease: Secondary | ICD-10-CM | POA: Diagnosis not present

## 2020-01-01 DIAGNOSIS — J449 Chronic obstructive pulmonary disease, unspecified: Secondary | ICD-10-CM | POA: Diagnosis not present

## 2020-01-01 DIAGNOSIS — I350 Nonrheumatic aortic (valve) stenosis: Secondary | ICD-10-CM | POA: Diagnosis not present

## 2020-01-01 DIAGNOSIS — I739 Peripheral vascular disease, unspecified: Secondary | ICD-10-CM | POA: Diagnosis not present

## 2020-01-01 NOTE — Patient Outreach (Signed)
[facility]) Care Management  01/01/2020  [name] [name] [birth year] [id number]   EMMI- General Discharge RED ON EMMI ALERT Day # 4 Date: 12/31/19 Red Alert Reason:  Lost interest in things? yes  Outreach attempt: spoke with patient. Discussed red alert.  Patient placed daughter [name] on the phone as well.  Discussed red alert.  Patient does not remember answering it.    Daughter states that patient seems  depressed related to recent hospitalization and recovery.  She states patient takes Zoloft regularly.  Discussed depression and the Zoloft may need to be increased if patient does not feel better.  Daughter thinks that it is situational, will continue to monitor and will discuss with physician on Tuesday scheduled visit.  Offered ongoing [facility] services and support.  Daughter declined at this time.     Plan: RN CM will close case.   Bary Leriche, RN, MSN [facility] Care Management Care Management Coordinator Direct Line [phone] Toll Free: [phone]  Fax: [phone]

## 2020-01-02 ENCOUNTER — Ambulatory Visit: Payer: Medicare HMO | Admitting: Physician Assistant

## 2020-01-02 DIAGNOSIS — I350 Nonrheumatic aortic (valve) stenosis: Secondary | ICD-10-CM | POA: Diagnosis not present

## 2020-01-02 DIAGNOSIS — R8281 Pyuria: Secondary | ICD-10-CM | POA: Diagnosis not present

## 2020-01-02 DIAGNOSIS — I5032 Chronic diastolic (congestive) heart failure: Secondary | ICD-10-CM | POA: Diagnosis not present

## 2020-01-02 DIAGNOSIS — I130 Hypertensive heart and chronic kidney disease with heart failure and stage 1 through stage 4 chronic kidney disease, or unspecified chronic kidney disease: Secondary | ICD-10-CM | POA: Diagnosis not present

## 2020-01-02 DIAGNOSIS — I739 Peripheral vascular disease, unspecified: Secondary | ICD-10-CM | POA: Diagnosis not present

## 2020-01-02 DIAGNOSIS — J449 Chronic obstructive pulmonary disease, unspecified: Secondary | ICD-10-CM | POA: Diagnosis not present

## 2020-01-02 DIAGNOSIS — N1831 Chronic kidney disease, stage 3a: Secondary | ICD-10-CM | POA: Diagnosis not present

## 2020-01-02 DIAGNOSIS — I69351 Hemiplegia and hemiparesis following cerebral infarction affecting right dominant side: Secondary | ICD-10-CM | POA: Diagnosis not present

## 2020-01-02 DIAGNOSIS — D631 Anemia in chronic kidney disease: Secondary | ICD-10-CM | POA: Diagnosis not present

## 2020-01-03 ENCOUNTER — Ambulatory Visit (INDEPENDENT_AMBULATORY_CARE_PROVIDER_SITE_OTHER): Payer: Medicare HMO | Admitting: Nurse Practitioner

## 2020-01-03 ENCOUNTER — Other Ambulatory Visit: Payer: Self-pay

## 2020-01-03 ENCOUNTER — Encounter: Payer: Self-pay | Admitting: Nurse Practitioner

## 2020-01-03 VITALS — BP 120/68 | HR 92 | Ht 59.0 in | Wt 181.0 lb

## 2020-01-03 DIAGNOSIS — I10 Essential (primary) hypertension: Secondary | ICD-10-CM

## 2020-01-03 DIAGNOSIS — E782 Mixed hyperlipidemia: Secondary | ICD-10-CM | POA: Diagnosis not present

## 2020-01-03 DIAGNOSIS — Z79891 Long term (current) use of opiate analgesic: Secondary | ICD-10-CM

## 2020-01-03 DIAGNOSIS — I5032 Chronic diastolic (congestive) heart failure: Secondary | ICD-10-CM

## 2020-01-03 DIAGNOSIS — M8008XD Age-related osteoporosis with current pathological fracture, vertebra(e), subsequent encounter for fracture with routine healing: Secondary | ICD-10-CM

## 2020-01-03 DIAGNOSIS — Z7982 Long term (current) use of aspirin: Secondary | ICD-10-CM

## 2020-01-03 DIAGNOSIS — I350 Nonrheumatic aortic (valve) stenosis: Secondary | ICD-10-CM

## 2020-01-03 DIAGNOSIS — E039 Hypothyroidism, unspecified: Secondary | ICD-10-CM

## 2020-01-03 DIAGNOSIS — M1990 Unspecified osteoarthritis, unspecified site: Secondary | ICD-10-CM

## 2020-01-03 DIAGNOSIS — I130 Hypertensive heart and chronic kidney disease with heart failure and stage 1 through stage 4 chronic kidney disease, or unspecified chronic kidney disease: Secondary | ICD-10-CM | POA: Diagnosis not present

## 2020-01-03 DIAGNOSIS — E785 Hyperlipidemia, unspecified: Secondary | ICD-10-CM

## 2020-01-03 DIAGNOSIS — I69351 Hemiplegia and hemiparesis following cerebral infarction affecting right dominant side: Secondary | ICD-10-CM | POA: Diagnosis not present

## 2020-01-03 DIAGNOSIS — N1831 Chronic kidney disease, stage 3a: Secondary | ICD-10-CM | POA: Diagnosis not present

## 2020-01-03 DIAGNOSIS — R8281 Pyuria: Secondary | ICD-10-CM | POA: Diagnosis not present

## 2020-01-03 DIAGNOSIS — D631 Anemia in chronic kidney disease: Secondary | ICD-10-CM | POA: Diagnosis not present

## 2020-01-03 DIAGNOSIS — I739 Peripheral vascular disease, unspecified: Secondary | ICD-10-CM | POA: Diagnosis not present

## 2020-01-03 DIAGNOSIS — Z792 Long term (current) use of antibiotics: Secondary | ICD-10-CM

## 2020-01-03 DIAGNOSIS — Z7902 Long term (current) use of antithrombotics/antiplatelets: Secondary | ICD-10-CM

## 2020-01-03 DIAGNOSIS — Z87891 Personal history of nicotine dependence: Secondary | ICD-10-CM

## 2020-01-03 DIAGNOSIS — Z9981 Dependence on supplemental oxygen: Secondary | ICD-10-CM

## 2020-01-03 DIAGNOSIS — F418 Other specified anxiety disorders: Secondary | ICD-10-CM

## 2020-01-03 DIAGNOSIS — J449 Chronic obstructive pulmonary disease, unspecified: Secondary | ICD-10-CM

## 2020-01-03 DIAGNOSIS — M62830 Muscle spasm of back: Secondary | ICD-10-CM

## 2020-01-03 DIAGNOSIS — Z9181 History of falling: Secondary | ICD-10-CM

## 2020-01-03 NOTE — Progress Notes (Signed)
Office Visit    Patient Name: [name] [name] Date of Encounter: 01/03/2020  Primary Care Provider:  [name], MD Primary Cardiologist:  Julien Nordmann, MD  Chief Complaint    74 year old female with a history of HFpEF, stroke, COPD on home O2, mild-mod AS/mild AI, peripheral arterial disease, hypertension, hyperlipidemia, spinal compression fractures, osteoporosis, morbid obesity, depression, frequent falls, severe deconditioning, and carotid arterial disease, who presents for follow-up after recent hospitalization for weakness and elevated high-sensitivity troponin.  Past Medical History    Past Medical History:  Diagnosis Date  . (HFpEF) heart failure with preserved ejection fraction (HCC)    a. 11/2019 Echo: EF 60-65%, no rwma. Mild LVH. Mild AI, mild to mod AS.  [name] Aneurysm (HCC)   . Anxiety   . Anxiety and depression   . Carotid stenosis    R 50% (12/2012)  . Concussion 08/03/2015  . COPD (chronic obstructive pulmonary disease) (HCC) 12/2012   spirometry: Pre: FVC 84%, FEV1 69%, ratio 0.64 consistent with moderate obstruction.  . Depression   . Fall 08/03/2015   d/c home health 08/2015  . Fracture of cervical vertebra, C5 (HCC) 08/06/2015  . History of chicken pox   . Hyperlipidemia   . Hypertension   . Lower back pain    h/o HNP s/p surgery  . Osteoarthritis    h/o ruptured disc s/p ESI  . Osteoporosis 11/2010   DEXA -2.7 spine, thoracic compression fracture  . Peripheral vascular disease (HCC)   . Smoker    quit 10/2012  . Stroke [facility]) 2010   x3 with residual R hemiparesis, s/p R MCA balloon angioplasty (2010)   Past Surgical History:  Procedure Laterality Date  . APPENDECTOMY  1960  . CATARACT EXTRACTION     bilateral  . CESAREAN SECTION    . CHOLECYSTECTOMY  1970  . COLONOSCOPY  2004   diverticulosis, no polyps Jarold Motto)  . COLONOSCOPY  05/2016   decreased sphincter tone, diverticulosis, no f/u recommended (Danis)  . DEXA  11/2010   T -2.7  spine, -1.9 hip  . HIP SURGERY Left 2006   fractured - screws placed  . IR ANGIO INTRA EXTRACRAN SEL COM CAROTID INNOMINATE BILAT MOD SED  08/01/2018  . IR ANGIO INTRA EXTRACRAN SEL COM CAROTID INNOMINATE BILAT MOD SED  12/10/2018  . IR ANGIO VERTEBRAL SEL SUBCLAVIAN INNOMINATE UNI R MOD SED  08/01/2018  . IR ANGIO VERTEBRAL SEL SUBCLAVIAN INNOMINATE UNI R MOD SED  12/10/2018  . IR ANGIO VERTEBRAL SEL VERTEBRAL UNI L MOD SED  12/10/2018  . IR RADIOLOGIST EVAL & MGMT  01/09/2018  . KYPHOPLASTY  10/02/2017   Procedure: LUMBAR FOUR KYPHOPLASTY;  Surgeon: Coletta Memos, MD;  Location: [facility] OR;  Service: Neurosurgery;;  . RADIOLOGY WITH ANESTHESIA N/A 11/11/2015   Procedure: RADIOLOGY WITH ANESTHESIA;  Surgeon: Julieanne Cotton, MD;  Location: [facility] OR;  Service: Radiology;  Laterality: N/A;  . RADIOLOGY WITH ANESTHESIA N/A 12/10/2018   Procedure: STENTING;  Surgeon: Julieanne Cotton, MD;  Location: [facility] OR;  Service: Radiology;  Laterality: N/A;    Allergies  Allergies  Allergen Reactions  . Doxycycline Nausea Only and Other (See Comments)    Sig GI upset  . Fluticasone-Salmeterol Anxiety    History of Present Illness    74 year old female with the above past medical history including HFpEF, stroke, COPD on home O2, mild to moderate aortic stenosis, mild aortic insufficiency, peripheral arterial disease, hypertension, hyperlipidemia, spinal compression fractures, osteoporosis, morbid obesity, depression, frequent falls,  severe deconditioning, and carotid arterial disease.  She was last seen in cardiology clinic on March 1, at which time she noted lower extremity swelling and exertional dyspnea.  Diuretic dosing was adjusted with subsequent improvement in edema.  She presented to the [facility] ED on March 28, due to generalized fatigue, weakness, and muscle spasms ever since receiving her second COVID-19 vaccine dose.  In the ED, she was mildly tachycardic with normal blood pressure.  D-dimer was elevated at  3.52 however, CT angiogram of the chest was negative for PE.  ECG showed sinus rhythm with transient mild ST changes that subsequently normalized.  High-sensitivity troponins were mildly elevated at 403 and then 392.  She was admitted by the medicine team.  Troponin trend remain mild and flat.  Echocardiogram showed normal LV function with mild to moderate AS, and mild AI.  Benazepril therapy was held and blood pressure was stable.  No further cardiac work-up was recommended and she was discharged on April 1 with home health physical therapy.  Since discharge, she has cont to feel weak and fatigued but is now working w/ physical therapy.  She has had complete resolution of lower extremity swelling and so now is only taking Lasix on an as-needed basis.  She has some degree of chronic dyspnea on exertion and uses supplemental oxygen when her oxygen saturation drops below 90% at home.  This is only sometimes during the day and she wears every night.  She denies palpitations, chest pain, PND, orthopnea, dizziness, syncope, or early satiety.  Home Medications    Prior to Admission medications   Medication Sig Start Date End Date Taking? Authorizing Provider  acetaminophen (TYLENOL) 500 MG tablet Take 1 tablet (500 mg total) by mouth every 6 (six) hours as needed for headache. Patient taking differently: Take 500 mg by mouth every 6 (six) hours as needed for mild pain or headache.  01/01/19   [name], MD  albuterol (PROVENTIL HFA;VENTOLIN HFA) 108 (90 Base) MCG/ACT inhaler Inhale 2 puffs into the lungs every 6 (six) hours as needed for wheezing or shortness of breath. 11/19/18   [name], MD  aspirin (ASPIRIN 81) 81 MG EC tablet Take 1 tablet (81 mg total) by mouth daily. Swallow whole. 12/15/18   [name], MD  atorvastatin (LIPITOR) 40 MG tablet TAKE 1 TABLET EVERY DAY Patient taking differently: Take 40 mg by mouth daily.  09/19/19   [name], MD  Cholecalciferol (VITAMIN  D3) 125 MCG (5000 UT) CAPS Take 5,000 Units by mouth daily.     [provider]  Cholecalciferol (VITAMIN D3) 25 MCG (1000 UT) CAPS Take 1,000 Units by mouth daily.    [provider]  clopidogrel (PLAVIX) 75 MG tablet TAKE 1 TABLET (75 MG TOTAL) BY MOUTH DAILY. 11/19/19   [name], MD  Cranberry-Vitamin C (AZO CRANBERRY URINARY TRACT PO) Take 1 tablet by mouth in the morning and at bedtime.     [provider]  ezetimibe (ZETIA) 10 MG tablet Take 1 tablet (10 mg total) by mouth daily. 12/19/19   [name], MD  fluticasone-salmeterol (ADVAIR HFA) [phone] MCG/ACT inhaler Inhale 2 puffs into the lungs 2 (two) times daily. Rinse mouth after use. Patient not taking: Reported on 12/22/2019 11/21/17   Shane Crutch, MD  furosemide (LASIX) 40 MG tablet Take 20 mg by mouth daily.  11/21/19   [name], MD  gabapentin (NEURONTIN) 100 MG capsule Take 1 capsule (100 mg total) by mouth at bedtime. 06/21/19  Ria Bush, MD  Homeopathic Products (ZINC COLD THERAPY PO) Take 1 tablet by mouth daily.     [provider]  HYDROcodone-acetaminophen (NORCO/VICODIN) 5-325 MG tablet Take 1 tablet by mouth every 6 (six) hours as needed for moderate pain (If not controlled by tramadol). 12/26/19   Mikhail, Velta Addison, DO  levothyroxine (SYNTHROID) 50 MCG tablet TAKE 1 TABLET  DAILY BEFORE BREAKFAST. Patient taking differently: Take 50 mcg by mouth daily before breakfast.  09/12/19   Ria Bush, MD  methocarbamol (ROBAXIN) 500 MG tablet Take 1 tablet (500 mg total) by mouth every 8 (eight) hours as needed for muscle spasms. 12/26/19   Mikhail, Velta Addison, DO  Multiple Vitamins-Minerals (MULTIVITAMIN PO) Take 1 tablet by mouth daily.    [provider]  ondansetron (ZOFRAN) 4 MG tablet Take 1 tablet (4 mg total) by mouth every 8 (eight) hours as needed for nausea or vomiting. 12/20/19   Ria Bush, MD  OXYGEN Inhale 2 L/min into the lungs See  admin instructions. Inhale 2 L/min of oxygen at bedtime and during the day as needed for shortness of breath    [provider]  pantoprazole (PROTONIX) 40 MG tablet Take 1 tablet (40 mg total) by mouth daily. For 2 weeks then as needed Patient taking differently: Take 40 mg by mouth every other day.  06/21/19   Ria Bush, MD  Potassium Chloride ER 20 MEQ TBCR TAKE 2 TABLETS BY MOUTH AS NEEDED (WHENEVER  YOU  TAKE  LASIX) Patient taking differently: Take 20 mEq by mouth daily.  11/07/19   Ria Bush, MD  sertraline (ZOLOFT) 100 MG tablet TAKE 1 TABLET EVERY DAY Patient taking differently: Take 100 mg by mouth daily.  09/19/19   Ria Bush, MD  traMADol (ULTRAM) 50 MG tablet TAKE 1 TO 2 TABLETS BY MOUTH THREE TIMES DAILY AS NEEDED FOR MODERATE PAIN Patient taking differently: Take 50-100 mg by mouth 3 (three) times daily as needed for moderate pain.  11/29/19   Ria Bush, MD  traZODone (DESYREL) 100 MG tablet Take 1.5 tablets (150 mg total) by mouth at bedtime. 11/21/19   Ria Bush, MD  trimethoprim (TRIMPEX) 100 MG tablet Take 1 tablet (100 mg total) by mouth daily. Take daily to prevent UTI 11/05/19   Debroah Loop, PA-C    Review of Systems    Some degree of chronic dyspnea on exertion and uses supplemental oxygen as needed and at night.  She continues to have fatigue but is now working with physical therapy.  She denies chest pain, palpitations, PND, orthopnea, dizziness, syncope, edema, or early satiety.  All other systems reviewed and are otherwise negative except as noted above.  Physical Exam    VS:  BP 120/68 (BP Location: Right Arm, Patient Position: Sitting, Cuff Size: Normal)   Pulse 92   Ht 4\' 11"  (1.499 m)   Wt 181 lb (82.1 kg)   SpO2 90%   BMI 36.56 kg/m  , BMI Body mass index is 36.56 kg/m. GEN: Well nourished, well developed, in no acute distress. HEENT: normal. Neck: Supple, no JVD, carotid bruits, or masses. Cardiac:  RRR, no murmurs, rubs, or gallops. No clubbing, cyanosis, edema.  Radials/PT 2+ and equal bilaterally.  Respiratory:  Respirations regular and unlabored, markedly diminished breath sounds bilaterally. GI: Soft, nontender, nondistended, BS + x 4. MS: no deformity or atrophy. Skin: warm and dry, no rash. Neuro:  Strength and sensation are intact. Psych: Normal affect.  Accessory Clinical Findings    ECG personally  reviewed by me today -regular sinus rhythm, 92, leftward axis, left atrial enlargement, LVH- no acute changes.  Lab Results  Component Value Date   WBC 5.6 12/26/2019   HGB 11.3 (L) 12/26/2019   HCT 35.9 (L) 12/26/2019   MCV 96.2 12/26/2019   PLT 166 12/26/2019   Lab Results  Component Value Date   CREATININE 1.24 (H) 12/26/2019   BUN 13 12/26/2019   NA 140 12/26/2019   K 4.3 12/26/2019   CL 101 12/26/2019   CO2 27 12/26/2019   Lab Results  Component Value Date   ALT 18 12/23/2019   AST 25 12/23/2019   ALKPHOS 73 12/23/2019   BILITOT 0.6 12/23/2019   Lab Results  Component Value Date   CHOL 237 (H) 07/01/2019   HDL 65.50 07/01/2019   LDLCALC 157 (H) 07/01/2019   LDLDIRECT 84.0 09/11/2017   TRIG 75.0 07/01/2019   CHOLHDL 4 07/01/2019    Lab Results  Component Value Date   HGBA1C 5.1 07/31/2018    Assessment & Plan    1.  Chronic heart failure with preserved EF: Euvolemic on examination today with stable heart rate and blood pressure.  Previous prescribed Lasix for lower extremity swelling however this is resolved and her daughter has only been giving it to her on an as-needed basis.  We discussed that they should use 20 mg daily as needed for weight gain of 3 pounds overnight or 5 pounds over the course of a week.  2.  Elevated high-sensitivity troponin: During recent hospitalization for fatigue and weakness following COVID-19 vaccination, troponin was mildly elevated in the 400 range with a flat trend.  She was not having any chest pain.  CTA of the chest  was negative for PE.  Echocardiogram showed normal LV function without wall motion abnormalities.  No further ischemic work-up warranted.  She is on chronic aspirin, statin, Plavix therapy in the setting of PAD and prior strokes.  3.  Essential hypertension: Benazepril discontinued during hospitalization in the setting of fatigue.  Pressures have been stable off of this.    4.  Hyperlipidemia: Last calculated LDL was 157 in October with total cholesterol 237 at that time.  Atorvastatin was started in December 2020.  Recent normal LFTs.  She will require follow-up lipids in the future.  She is not fasting today.  5.  Peripheral arterial disease/carotid arterial disease: Asymptomatic.  She remains on aspirin, Plavix, and statin therapy.  6.  Prior strokes: Aspirin, Plavix, statin.  7.  COPD: Using O2 as needed during the day and nightly.  8.  Disposition: Follow-up in clinic in 3 to 4 months or sooner if necessary.   Nicolasa Ducking, NP 01/03/2020, 2:00 PM

## 2020-01-03 NOTE — Patient Instructions (Signed)
Medication Instructions:  1- Take 1 tablet (20 mg total) once daily as needed for weight gain of 3 pounds in 1 day or 5 in 1 week.  *If you need a refill on your cardiac medications before your next appointment, please call your pharmacy*   Lab Work: None ordered  If you have labs (blood work) drawn today and your tests are completely normal, you will receive your results only by: [name] MyChart Message (if you have MyChart) OR . A paper copy in the mail If you have any lab test that is abnormal or we need to change your treatment, we will call you to review the results.   Testing/Procedures: None ordered    Follow-Up: At [facility], you and your health needs are our priority.  As part of our continuing mission to provide you with exceptional heart care, we have created designated Provider Care Teams.  These Care Teams include your primary Cardiologist (physician) and Advanced Practice Providers (APPs -  Physician Assistants and Nurse Practitioners) who all work together to provide you with the care you need, when you need it.  We recommend signing up for the patient portal called "MyChart".  Sign up information is provided on this After Visit Summary.  MyChart is used to connect with patients for Virtual Visits (Telemedicine).  Patients are able to view lab/test results, encounter notes, upcoming appointments, etc.  Non-urgent messages can be sent to your provider as well.   To learn more about what you can do with MyChart, go to [email or web address].    Your next appointment:   3-4 month(s)  The format for your next appointment:   In Person  Provider:    You may see Julien Nordmann, MD or Nicolasa Ducking, NP.

## 2020-01-06 ENCOUNTER — Other Ambulatory Visit: Payer: Self-pay | Admitting: Family Medicine

## 2020-01-06 DIAGNOSIS — I350 Nonrheumatic aortic (valve) stenosis: Secondary | ICD-10-CM | POA: Diagnosis not present

## 2020-01-06 DIAGNOSIS — R8281 Pyuria: Secondary | ICD-10-CM | POA: Diagnosis not present

## 2020-01-06 DIAGNOSIS — I739 Peripheral vascular disease, unspecified: Secondary | ICD-10-CM | POA: Diagnosis not present

## 2020-01-06 DIAGNOSIS — J449 Chronic obstructive pulmonary disease, unspecified: Secondary | ICD-10-CM | POA: Diagnosis not present

## 2020-01-06 DIAGNOSIS — I69351 Hemiplegia and hemiparesis following cerebral infarction affecting right dominant side: Secondary | ICD-10-CM | POA: Diagnosis not present

## 2020-01-06 DIAGNOSIS — D631 Anemia in chronic kidney disease: Secondary | ICD-10-CM | POA: Diagnosis not present

## 2020-01-06 DIAGNOSIS — N1831 Chronic kidney disease, stage 3a: Secondary | ICD-10-CM | POA: Diagnosis not present

## 2020-01-06 DIAGNOSIS — I5032 Chronic diastolic (congestive) heart failure: Secondary | ICD-10-CM | POA: Diagnosis not present

## 2020-01-06 DIAGNOSIS — I130 Hypertensive heart and chronic kidney disease with heart failure and stage 1 through stage 4 chronic kidney disease, or unspecified chronic kidney disease: Secondary | ICD-10-CM | POA: Diagnosis not present

## 2020-01-07 ENCOUNTER — Other Ambulatory Visit: Payer: Self-pay

## 2020-01-07 ENCOUNTER — Encounter: Payer: Self-pay | Admitting: Family Medicine

## 2020-01-07 ENCOUNTER — Telehealth: Payer: Self-pay | Admitting: Family Medicine

## 2020-01-07 ENCOUNTER — Ambulatory Visit (INDEPENDENT_AMBULATORY_CARE_PROVIDER_SITE_OTHER): Payer: Medicare HMO | Admitting: Family Medicine

## 2020-01-07 VITALS — BP 122/86 | HR 67 | Temp 97.7°F | Ht 59.0 in | Wt 183.2 lb

## 2020-01-07 DIAGNOSIS — M8000XA Age-related osteoporosis with current pathological fracture, unspecified site, initial encounter for fracture: Secondary | ICD-10-CM | POA: Diagnosis not present

## 2020-01-07 DIAGNOSIS — I10 Essential (primary) hypertension: Secondary | ICD-10-CM | POA: Diagnosis not present

## 2020-01-07 DIAGNOSIS — R531 Weakness: Secondary | ICD-10-CM | POA: Diagnosis not present

## 2020-01-07 DIAGNOSIS — S22000A Wedge compression fracture of unspecified thoracic vertebra, initial encounter for closed fracture: Secondary | ICD-10-CM | POA: Diagnosis not present

## 2020-01-07 DIAGNOSIS — N1831 Chronic kidney disease, stage 3a: Secondary | ICD-10-CM | POA: Diagnosis not present

## 2020-01-07 LAB — RENAL FUNCTION PANEL
Albumin: 4.1 g/dL (ref 3.5–5.2)
BUN: 16 mg/dL (ref 6–23)
CO2: 28 mEq/L (ref 19–32)
Calcium: 9.6 mg/dL (ref 8.4–10.5)
Chloride: 100 mEq/L (ref 96–112)
Creatinine, Ser: 1.08 mg/dL (ref 0.40–1.20)
GFR: 49.69 mL/min — ABNORMAL LOW (ref >60.00)
Glucose, Bld: 93 mg/dL (ref 70–99)
Phosphorus: 3 mg/dL (ref 2.3–4.6)
Potassium: 4.6 mEq/L (ref 3.5–5.1)
Sodium: 135 mEq/L (ref 135–145)

## 2020-01-07 LAB — CBC WITH DIFFERENTIAL/PLATELET
Basophils Absolute: 0.1 10*3/uL (ref 0.0–0.1)
Basophils Relative: 1.2 % (ref 0.0–3.0)
Eosinophils Absolute: 0.2 10*3/uL (ref 0.0–0.7)
Eosinophils Relative: 2.4 % (ref 0.0–5.0)
HCT: 38.5 % (ref 36.0–46.0)
Hemoglobin: 12.6 g/dL (ref 12.0–15.0)
Lymphocytes Relative: 16.8 % (ref 12.0–46.0)
Lymphs Abs: 1.4 10*3/uL (ref 0.7–4.0)
MCHC: 32.6 g/dL (ref 30.0–36.0)
MCV: 91.5 fl (ref 78.0–100.0)
Monocytes Absolute: 0.5 10*3/uL (ref 0.1–1.0)
Monocytes Relative: 6.1 % (ref 3.0–12.0)
Neutro Abs: 6.3 10*3/uL (ref 1.4–7.7)
Neutrophils Relative %: 73.5 % (ref 43.0–77.0)
Platelets: 280 10*3/uL (ref 150.0–400.0)
RBC: 4.21 Mil/uL (ref 3.87–5.11)
RDW: 13.8 % (ref 11.5–15.5)
WBC: 8.5 10*3/uL (ref 4.0–10.5)

## 2020-01-07 MED ORDER — TRAMADOL HCL 50 MG PO TABS: 50.0000 mg | ORAL_TABLET | Freq: Two times a day (BID) | ORAL | 0 refills | Status: DC | PRN

## 2020-01-07 MED ORDER — METHOCARBAMOL 500 MG PO TABS: 500.0000 mg | ORAL_TABLET | Freq: Three times a day (TID) | ORAL | 1 refills | Status: DC | PRN

## 2020-01-07 MED ORDER — HYDROCODONE-ACETAMINOPHEN 5-325 MG PO TABS: 1.0000 | ORAL_TABLET | Freq: Three times a day (TID) | ORAL | 0 refills | Status: DC | PRN

## 2020-01-07 MED ORDER — ALENDRONATE SODIUM 70 MG PO TABS: ORAL_TABLET | ORAL | 2 refills | Status: DC

## 2020-01-07 NOTE — Addendum Note (Signed)
Addended by: Nanci Pina on: 01/07/2020 02:26 PM   Modules accepted: Orders

## 2020-01-07 NOTE — Patient Instructions (Addendum)
Schedule tylenol 500mg  twice daily every day. May also use robaxin twice daily to three times daily as needed.  Start with tramadol for breakthrough pain, if not controlling pain then may take vicodin tablet.  All meds refilled today.  Labs today.  We will refer you to neurosurgery to follow newest compression fracture at T10.  I will ask care manager to come out to the house for further evaluation on level of care needed for safety.

## 2020-01-07 NOTE — Assessment & Plan Note (Addendum)
Recurrent compression fracture, newly noted at T10 on MRI. This is despite fosamax weekly (will need to verify she continues taking this as it seems to have fallen off her med list).  See below.

## 2020-01-07 NOTE — Assessment & Plan Note (Addendum)
Newly found on MRI during recent hospitalization.  Will ensure on bisphosphonate.  Reviewed pain med management - as she notes improvement in pain, rec transition to scheduled tylenol 500mg  BID saving tramadol then vicodin for breakthrough pain. Continue TLSO brace.  Will place new referral to neurosurgery as pt has had difficulty scheduling.

## 2020-01-07 NOTE — Progress Notes (Signed)
This visit was conducted in person.  BP 122/86 (BP Location: Right Arm, Patient Position: Sitting, Cuff Size: Normal)   Pulse 67   Temp 97.7 F (36.5 C) (Temporal)   Ht 4\' 11"  (1.499 m)   Wt 183 lb 4 oz (83.1 kg)   SpO2 100%   BMI 37.01 kg/m    CC: 6 mo f/u visit  Subjective:    Patient ID: [name] [name], female    DOB: 16-Jul-1946, 74 y.o.   MRN: [id number]  HPI: [name] [name] is a 74 y.o. female presenting on 01/07/2020 for Follow-up (Here for 6 mo f/u.  Pt accompanied by daughter, [name]- temp 97.7.)    Recent hospitalization for elevated troponins concern for cardiomyopathy however cardiac eval was normal. rec f/u with neurosurgery, cardiology. Benazepril was stopped - BP remaining controlled. No lasix needed since hospitalization.   For back pain/spasm, concern for occult fracture - MRI showed acute/subacute T10 vertebral body fracture - rec f/u with neurosurgery. Placed in TSLO brace. They haven't been able to schedule neurosurgery appointment. Ongoing muscle spasms managing with robaxin bid and hydrocodone for breakthrough pain despite tramadol. Constipation managed with miralax. Also taking tylenol PRN.  UCx shoed 40k E faecalis however she denies any UTI symptoms. Continues chronic ppx TMP.   Hospital therapists initially recommended SNF, but ultimately recommended HH.  [facility] PT/OT coming out twice weekly.  Daughter 01/09/2020 brings up concern about ability to safely care for her mother at home in setting of progressive debility and recent falls. Other daughter understandably wants to keep mom at home, Jaidin understands there may come a point where this is no longer safe to do. I encouraged continued discussion as a family. They would likely not be able to afford personal care services at home for level of care needed. I will ask care manager to come out to the house for further evaluation on level of care need.   Admit date: 12/22/2019 Discharge date:  12/26/2019 TCM hosp f/u phone call not performed.   Recommendations for Outpatient Follow-up:  Patient will be discharged to home with home health physical therapy.  Patient will need to follow up with primary care provider within one week of discharge, repeat CBC and BMP.  Follow up with cardiology. Follow up with Dr. 02/25/2020, neurosurgery, in 4-6 weeks. Patient should continue medications as prescribed.  Patient should follow a heart healthy diet.   Discharge Diagnoses:  Elevated troponin Aortic stenosis Chronic diastolic CHF Essential hypertension History of CVA/carotid stenosis/peripheral vascular disease/hyperlipidemia Acute kidney injury on chronic kidney disease, stage IIIa Anemia of chronic disease Pyuria COPD Anxiety/depression Hypothyroidism Generalized weakness/debility Spasms/Back pain  Discharge Condition: Stable Diet recommendation: heart healthy     Relevant past medical, surgical, family and social history reviewed and updated as indicated. Interim medical history since our last visit reviewed. Allergies and medications reviewed and updated. Outpatient Medications Prior to Visit  Medication Sig Dispense Refill  . acetaminophen (TYLENOL) 500 MG tablet Take 1 tablet (500 mg total) by mouth every 6 (six) hours as needed for headache. (Patient taking differently: Take 500 mg by mouth every 6 (six) hours as needed for mild pain or headache. )    . albuterol (PROVENTIL HFA;VENTOLIN HFA) 108 (90 Base) MCG/ACT inhaler Inhale 2 puffs into the lungs every 6 (six) hours as needed for wheezing or shortness of breath. 1 Inhaler 2  . aspirin (ASPIRIN 81) 81 MG EC tablet Take 1 tablet (81 mg total) by mouth daily. Swallow whole.    [name] [name]  atorvastatin (LIPITOR) 40 MG tablet TAKE 1 TABLET EVERY DAY (Patient taking differently: Take 40 mg by mouth daily. ) 90 tablet 1  . Cholecalciferol (VITAMIN D3) 125 MCG (5000 UT) CAPS Take 5,000 Units by mouth daily.     . clopidogrel (PLAVIX) 75  MG tablet TAKE 1 TABLET (75 MG TOTAL) BY MOUTH DAILY. 90 tablet 1  . Cranberry-Vitamin C (AZO CRANBERRY URINARY TRACT PO) Take 1 tablet by mouth in the morning and at bedtime.     [name] ezetimibe (ZETIA) 10 MG tablet Take 1 tablet (10 mg total) by mouth daily. 90 tablet 3  . fluticasone-salmeterol (ADVAIR HFA) 115-21 MCG/ACT inhaler Inhale 2 puffs into the lungs 2 (two) times daily. Rinse mouth after use. 1 Inhaler 12  . furosemide (LASIX) 40 MG tablet Take 20 mg by mouth daily as needed for other. Weight gain     . gabapentin (NEURONTIN) 100 MG capsule Take 1 capsule (100 mg total) by mouth at bedtime. 90 capsule 1  . Homeopathic Products (ZINC COLD THERAPY PO) Take 1 tablet by mouth daily.     [name] levothyroxine (SYNTHROID) 50 MCG tablet TAKE 1 TABLET  DAILY BEFORE BREAKFAST. (Patient taking differently: Take 50 mcg by mouth daily before breakfast. ) 90 tablet 1  . Multiple Vitamins-Minerals (MULTIVITAMIN PO) Take 1 tablet by mouth daily.    . ondansetron (ZOFRAN) 4 MG tablet Take 1 tablet (4 mg total) by mouth every 8 (eight) hours as needed for nausea or vomiting. 20 tablet 0  . OXYGEN Inhale 2 L/min into the lungs See admin instructions. Inhale 2 L/min of oxygen at bedtime and during the day as needed for shortness of breath    . Potassium Chloride ER 20 MEQ TBCR TAKE 2 TABLETS BY MOUTH AS NEEDED (WHENEVER  YOU  TAKE  LASIX) (Patient taking differently: Take 20 mEq by mouth daily. ) 180 tablet 2  . sertraline (ZOLOFT) 100 MG tablet TAKE 1 TABLET EVERY DAY (Patient taking differently: Take 100 mg by mouth daily. ) 90 tablet 1  . traZODone (DESYREL) 100 MG tablet Take 1.5 tablets (150 mg total) by mouth at bedtime. 135 tablet 1  . trimethoprim (TRIMPEX) 100 MG tablet Take 1 tablet (100 mg total) by mouth daily. Take daily to prevent UTI 90 tablet 1  . HYDROcodone-acetaminophen (NORCO/VICODIN) 5-325 MG tablet Take 1 tablet by mouth every 6 (six) hours as needed for moderate pain (If not controlled by  tramadol). 30 tablet 0  . methocarbamol (ROBAXIN) 500 MG tablet Take 1 tablet (500 mg total) by mouth every 8 (eight) hours as needed for muscle spasms. 30 tablet 0  . pantoprazole (PROTONIX) 40 MG tablet Take 1 tablet (40 mg total) by mouth daily. For 2 weeks then as needed (Patient taking differently: Take 40 mg by mouth every other day. ) 90 tablet 2  . traMADol (ULTRAM) 50 MG tablet TAKE 1 TO 2 TABLETS BY MOUTH THREE TIMES DAILY AS NEEDED FOR MODERATE PAIN (Patient taking differently: Take 50-100 mg by mouth 3 (three) times daily as needed for moderate pain. ) 30 tablet 0   No facility-administered medications prior to visit.     Per HPI unless specifically indicated in ROS section below Review of Systems Objective:    BP 122/86 (BP Location: Right Arm, Patient Position: Sitting, Cuff Size: Normal)   Pulse 67   Temp 97.7 F (36.5 C) (Temporal)   Ht 4\' 11"  (1.499 m)   Wt 183 lb 4 oz (83.1 kg)  SpO2 100%   BMI 37.01 kg/m   Wt Readings from Last 3 Encounters:  01/07/20 183 lb 4 oz (83.1 kg)  01/03/20 181 lb (82.1 kg)  12/22/19 165 lb (74.8 kg)    Physical Exam Vitals and nursing note reviewed.  Constitutional:      Appearance: Normal appearance. She is ill-appearing.     Comments: In wheelchair  Cardiovascular:     Rate and Rhythm: Normal rate and regular rhythm.     Pulses: Normal pulses.     Heart sounds: Normal heart sounds. No murmur.  Pulmonary:     Effort: Pulmonary effort is normal. No respiratory distress.     Breath sounds: Normal breath sounds. No wheezing, rhonchi or rales.  Musculoskeletal:     Right lower leg: No edema.     Left lower leg: No edema.     Comments: TSLO brace in place  Skin:    Coloration: Skin is pale.  Neurological:     General: No focal deficit present.     Mental Status: She is alert.  Psychiatric:        Mood and Affect: Mood normal.        Behavior: Behavior normal.        Assessment & Plan:  This visit occurred during the  SARS-CoV-2 public health emergency.  Safety protocols were in place, including screening questions prior to the visit, additional usage of staff PPE, and extensive cleaning of exam room while observing appropriate contact time as indicated for disinfecting solutions.   Problem List Items Addressed This Visit    Weakness    Concern over progressive difficulty safely caring for mother at home. I encouraged continued family discussion with Laurey's involvement. I have asked care manager to come out to the house for assessment.       Osteoporosis    Recurrent compression fracture, newly noted at T10 on MRI. This is despite fosamax weekly (will need to verify she continues taking this as it seems to have fallen off her med list).  See below.       Relevant Orders   Ambulatory referral to Neurosurgery   Hypertension    BP stable off benazepril - stay off for now.       Compression fracture of body of thoracic vertebra (HCC) - Primary    Newly found on MRI during recent hospitalization.  Will ensure on bisphosphonate.  Reviewed pain med management - as she notes improvement in pain, rec transition to scheduled tylenol 500mg  BID saving tramadol then vicodin for breakthrough pain. Continue TLSO brace.  Will place new referral to neurosurgery as pt has had difficulty scheduling.       Relevant Orders   Ambulatory referral to Neurosurgery   CKD (chronic kidney disease) stage 3, GFR 30-59 ml/min    Update renal panel after recent hospitalization. Now off benazepril, only using lasix PRN edema.       Relevant Orders   CBC with Differential/Platelet   Renal function panel       Meds ordered this encounter  Medications  . traMADol (ULTRAM) 50 MG tablet    Sig: Take 1-2 tablets (50-100 mg total) by mouth 2 (two) times daily as needed for moderate pain.    Dispense:  30 tablet    Refill:  0  . methocarbamol (ROBAXIN) 500 MG tablet    Sig: Take 1 tablet (500 mg total) by mouth every 8  (eight) hours as needed for muscle spasms.  Dispense:  40 tablet    Refill:  1  . HYDROcodone-acetaminophen (NORCO/VICODIN) 5-325 MG tablet    Sig: Take 1 tablet by mouth 3 (three) times daily as needed for moderate pain (If not controlled by tramadol).    Dispense:  15 tablet    Refill:  0   Orders Placed This Encounter  Procedures  . CBC with Differential/Platelet  . Renal function panel  . Ambulatory referral to Neurosurgery    Referral Priority:   Routine    Referral Type:   Surgical    Referral Reason:   Specialty Services Required    Requested Specialty:   Neurosurgery    Number of Visits Requested:   1    Patient Instructions  Schedule tylenol 500mg  twice daily every day. May also use robaxin twice daily to three times daily as needed.  Start with tramadol for breakthrough pain, if not controlling pain then may take vicodin tablet.  All meds refilled today.  Labs today.  We will refer you to neurosurgery to follow newest compression fracture at T10.  I will ask care manager to come out to the house for further evaluation on level of care needed for safety.    Follow up plan: Return if symptoms worsen or fail to improve.  , MD

## 2020-01-07 NOTE — Telephone Encounter (Signed)
[name] - can we ask [facility] care manager to come out to the house for evaluation for level of care needs and discussion on possible SNF placement? I believe [facility] is coming out currently.  [name] - can we verify with patient's daughter if they're taking weekly fosamax? I do want her to continue taking this. plz add onto list.

## 2020-01-07 NOTE — Assessment & Plan Note (Signed)
BP stable off benazepril - stay off for now.

## 2020-01-07 NOTE — Assessment & Plan Note (Signed)
Concern over progressive difficulty safely caring for mother at home. I encouraged continued family discussion with [name] [name]'s involvement. I have asked care manager to come out to the house for assessment.

## 2020-01-07 NOTE — Assessment & Plan Note (Signed)
Update renal panel after recent hospitalization. Now off benazepril, only using lasix PRN edema.

## 2020-01-07 NOTE — Telephone Encounter (Signed)
Thank you :)

## 2020-01-07 NOTE — Telephone Encounter (Signed)
Spoke with pt's daughter, [name], asking about the Fosamax.  States pt has not been taking it.  I relayed Dr. Timoteo Expose message.  She requested new rx be sent to [facility] mail order.  E-scribed refill.

## 2020-01-08 ENCOUNTER — Other Ambulatory Visit: Payer: Self-pay | Admitting: Family Medicine

## 2020-01-08 DIAGNOSIS — I130 Hypertensive heart and chronic kidney disease with heart failure and stage 1 through stage 4 chronic kidney disease, or unspecified chronic kidney disease: Secondary | ICD-10-CM | POA: Diagnosis not present

## 2020-01-08 DIAGNOSIS — I739 Peripheral vascular disease, unspecified: Secondary | ICD-10-CM | POA: Diagnosis not present

## 2020-01-08 DIAGNOSIS — R8281 Pyuria: Secondary | ICD-10-CM | POA: Diagnosis not present

## 2020-01-08 DIAGNOSIS — N1831 Chronic kidney disease, stage 3a: Secondary | ICD-10-CM | POA: Diagnosis not present

## 2020-01-08 DIAGNOSIS — D631 Anemia in chronic kidney disease: Secondary | ICD-10-CM | POA: Diagnosis not present

## 2020-01-08 DIAGNOSIS — J449 Chronic obstructive pulmonary disease, unspecified: Secondary | ICD-10-CM | POA: Diagnosis not present

## 2020-01-08 DIAGNOSIS — I5032 Chronic diastolic (congestive) heart failure: Secondary | ICD-10-CM | POA: Diagnosis not present

## 2020-01-08 DIAGNOSIS — I69351 Hemiplegia and hemiparesis following cerebral infarction affecting right dominant side: Secondary | ICD-10-CM | POA: Diagnosis not present

## 2020-01-08 DIAGNOSIS — I350 Nonrheumatic aortic (valve) stenosis: Secondary | ICD-10-CM | POA: Diagnosis not present

## 2020-01-08 NOTE — Telephone Encounter (Signed)
Called calvin Elayne Snare for [facility] -[phone]. Requested Care Manager go out to home for evaluation for level of care needs and possible SNF placement. [name] will pass this along to their Clinical team .

## 2020-01-09 NOTE — Telephone Encounter (Signed)
Gabapentin Last filled:  10/07/19, #90 Last OV:  01/07/20, 6 mo f/u Next OV:  none

## 2020-01-10 DIAGNOSIS — I130 Hypertensive heart and chronic kidney disease with heart failure and stage 1 through stage 4 chronic kidney disease, or unspecified chronic kidney disease: Secondary | ICD-10-CM | POA: Diagnosis not present

## 2020-01-10 DIAGNOSIS — I69351 Hemiplegia and hemiparesis following cerebral infarction affecting right dominant side: Secondary | ICD-10-CM | POA: Diagnosis not present

## 2020-01-10 DIAGNOSIS — I5032 Chronic diastolic (congestive) heart failure: Secondary | ICD-10-CM | POA: Diagnosis not present

## 2020-01-10 DIAGNOSIS — R8281 Pyuria: Secondary | ICD-10-CM | POA: Diagnosis not present

## 2020-01-10 DIAGNOSIS — I350 Nonrheumatic aortic (valve) stenosis: Secondary | ICD-10-CM | POA: Diagnosis not present

## 2020-01-10 DIAGNOSIS — J449 Chronic obstructive pulmonary disease, unspecified: Secondary | ICD-10-CM | POA: Diagnosis not present

## 2020-01-10 DIAGNOSIS — D631 Anemia in chronic kidney disease: Secondary | ICD-10-CM | POA: Diagnosis not present

## 2020-01-10 DIAGNOSIS — I739 Peripheral vascular disease, unspecified: Secondary | ICD-10-CM | POA: Diagnosis not present

## 2020-01-10 DIAGNOSIS — N1831 Chronic kidney disease, stage 3a: Secondary | ICD-10-CM | POA: Diagnosis not present

## 2020-01-14 DIAGNOSIS — I739 Peripheral vascular disease, unspecified: Secondary | ICD-10-CM | POA: Diagnosis not present

## 2020-01-14 DIAGNOSIS — D631 Anemia in chronic kidney disease: Secondary | ICD-10-CM | POA: Diagnosis not present

## 2020-01-14 DIAGNOSIS — I5032 Chronic diastolic (congestive) heart failure: Secondary | ICD-10-CM | POA: Diagnosis not present

## 2020-01-14 DIAGNOSIS — I350 Nonrheumatic aortic (valve) stenosis: Secondary | ICD-10-CM | POA: Diagnosis not present

## 2020-01-14 DIAGNOSIS — N1831 Chronic kidney disease, stage 3a: Secondary | ICD-10-CM | POA: Diagnosis not present

## 2020-01-14 DIAGNOSIS — I130 Hypertensive heart and chronic kidney disease with heart failure and stage 1 through stage 4 chronic kidney disease, or unspecified chronic kidney disease: Secondary | ICD-10-CM | POA: Diagnosis not present

## 2020-01-14 DIAGNOSIS — I69351 Hemiplegia and hemiparesis following cerebral infarction affecting right dominant side: Secondary | ICD-10-CM | POA: Diagnosis not present

## 2020-01-14 DIAGNOSIS — J449 Chronic obstructive pulmonary disease, unspecified: Secondary | ICD-10-CM | POA: Diagnosis not present

## 2020-01-14 DIAGNOSIS — R8281 Pyuria: Secondary | ICD-10-CM | POA: Diagnosis not present

## 2020-01-15 ENCOUNTER — Other Ambulatory Visit: Payer: Self-pay

## 2020-01-15 MED ORDER — TRAZODONE HCL 100 MG PO TABS: 150.0000 mg | ORAL_TABLET | Freq: Every day | ORAL | 1 refills | Status: DC

## 2020-01-15 NOTE — Telephone Encounter (Signed)
E-scribed refill 

## 2020-01-16 DIAGNOSIS — I69351 Hemiplegia and hemiparesis following cerebral infarction affecting right dominant side: Secondary | ICD-10-CM | POA: Diagnosis not present

## 2020-01-16 DIAGNOSIS — J449 Chronic obstructive pulmonary disease, unspecified: Secondary | ICD-10-CM | POA: Diagnosis not present

## 2020-01-16 DIAGNOSIS — R269 Unspecified abnormalities of gait and mobility: Secondary | ICD-10-CM | POA: Diagnosis not present

## 2020-01-16 DIAGNOSIS — D631 Anemia in chronic kidney disease: Secondary | ICD-10-CM | POA: Diagnosis not present

## 2020-01-16 DIAGNOSIS — I739 Peripheral vascular disease, unspecified: Secondary | ICD-10-CM | POA: Diagnosis not present

## 2020-01-16 DIAGNOSIS — I5032 Chronic diastolic (congestive) heart failure: Secondary | ICD-10-CM | POA: Diagnosis not present

## 2020-01-16 DIAGNOSIS — I130 Hypertensive heart and chronic kidney disease with heart failure and stage 1 through stage 4 chronic kidney disease, or unspecified chronic kidney disease: Secondary | ICD-10-CM | POA: Diagnosis not present

## 2020-01-16 DIAGNOSIS — N1831 Chronic kidney disease, stage 3a: Secondary | ICD-10-CM | POA: Diagnosis not present

## 2020-01-16 DIAGNOSIS — I350 Nonrheumatic aortic (valve) stenosis: Secondary | ICD-10-CM | POA: Diagnosis not present

## 2020-01-16 DIAGNOSIS — R8281 Pyuria: Secondary | ICD-10-CM | POA: Diagnosis not present

## 2020-01-17 ENCOUNTER — Telehealth: Payer: Self-pay

## 2020-01-17 NOTE — Telephone Encounter (Signed)
Incoming fax from pharmacy stating that Trimethoprim 100mg  is on back order until late June, they are requesting an alternative. Please advise. Thanks

## 2020-01-20 NOTE — Telephone Encounter (Signed)
I just spoke with the patient's pharmacy via telephone.  I am switching the patient to Bactrim SS 1/2 tablet daily for UTI prophylaxis for 3 months with plans to resume trimethoprim 100 mg daily once his medication becomes available again.

## 2020-01-21 DIAGNOSIS — I69351 Hemiplegia and hemiparesis following cerebral infarction affecting right dominant side: Secondary | ICD-10-CM | POA: Diagnosis not present

## 2020-01-21 DIAGNOSIS — J449 Chronic obstructive pulmonary disease, unspecified: Secondary | ICD-10-CM | POA: Diagnosis not present

## 2020-01-21 DIAGNOSIS — I130 Hypertensive heart and chronic kidney disease with heart failure and stage 1 through stage 4 chronic kidney disease, or unspecified chronic kidney disease: Secondary | ICD-10-CM | POA: Diagnosis not present

## 2020-01-21 DIAGNOSIS — I739 Peripheral vascular disease, unspecified: Secondary | ICD-10-CM | POA: Diagnosis not present

## 2020-01-21 DIAGNOSIS — I5032 Chronic diastolic (congestive) heart failure: Secondary | ICD-10-CM | POA: Diagnosis not present

## 2020-01-21 DIAGNOSIS — R8281 Pyuria: Secondary | ICD-10-CM | POA: Diagnosis not present

## 2020-01-21 DIAGNOSIS — I350 Nonrheumatic aortic (valve) stenosis: Secondary | ICD-10-CM | POA: Diagnosis not present

## 2020-01-21 DIAGNOSIS — D631 Anemia in chronic kidney disease: Secondary | ICD-10-CM | POA: Diagnosis not present

## 2020-01-21 DIAGNOSIS — I10 Essential (primary) hypertension: Secondary | ICD-10-CM | POA: Diagnosis not present

## 2020-01-21 DIAGNOSIS — S22070A Wedge compression fracture of T9-T10 vertebra, initial encounter for closed fracture: Secondary | ICD-10-CM | POA: Diagnosis not present

## 2020-01-21 DIAGNOSIS — Z6836 Body mass index (BMI) 36.0-36.9, adult: Secondary | ICD-10-CM | POA: Diagnosis not present

## 2020-01-21 DIAGNOSIS — N1831 Chronic kidney disease, stage 3a: Secondary | ICD-10-CM | POA: Diagnosis not present

## 2020-01-22 ENCOUNTER — Other Ambulatory Visit: Payer: Self-pay | Admitting: Neurosurgery

## 2020-01-22 ENCOUNTER — Telehealth: Payer: Self-pay | Admitting: Cardiovascular Disease

## 2020-01-22 DIAGNOSIS — R8281 Pyuria: Secondary | ICD-10-CM | POA: Diagnosis not present

## 2020-01-22 DIAGNOSIS — I69351 Hemiplegia and hemiparesis following cerebral infarction affecting right dominant side: Secondary | ICD-10-CM | POA: Diagnosis not present

## 2020-01-22 DIAGNOSIS — I350 Nonrheumatic aortic (valve) stenosis: Secondary | ICD-10-CM | POA: Diagnosis not present

## 2020-01-22 DIAGNOSIS — I5032 Chronic diastolic (congestive) heart failure: Secondary | ICD-10-CM | POA: Diagnosis not present

## 2020-01-22 DIAGNOSIS — N1831 Chronic kidney disease, stage 3a: Secondary | ICD-10-CM | POA: Diagnosis not present

## 2020-01-22 DIAGNOSIS — I130 Hypertensive heart and chronic kidney disease with heart failure and stage 1 through stage 4 chronic kidney disease, or unspecified chronic kidney disease: Secondary | ICD-10-CM | POA: Diagnosis not present

## 2020-01-22 DIAGNOSIS — J449 Chronic obstructive pulmonary disease, unspecified: Secondary | ICD-10-CM | POA: Diagnosis not present

## 2020-01-22 DIAGNOSIS — I739 Peripheral vascular disease, unspecified: Secondary | ICD-10-CM | POA: Diagnosis not present

## 2020-01-22 DIAGNOSIS — D631 Anemia in chronic kidney disease: Secondary | ICD-10-CM | POA: Diagnosis not present

## 2020-01-22 NOTE — Telephone Encounter (Signed)
   [facility] Pre-operative Risk Assessment    Request for surgical clearance:  1. What type of surgery is being performed? T10 Kyphoplasty   2. When is this surgery scheduled? 01/29/20  3. What type of clearance is required (medical clearance vs. Pharmacy clearance to hold med vs. Both)? both  4. Are there any medications that need to be held prior to surgery and how long? Plavix instructions  5. Practice name and name of physician performing surgery? [facility], Dr Ashok Pall  6. What is your office phone number [phone]   7.   What is your office fax number [phone]  8.   Anesthesia type (None, local, MAC, general) ? Not listed    Ace Gins 01/22/2020, 3:36 PM  _________________________________________________________________   (provider comments below)

## 2020-01-23 DIAGNOSIS — I739 Peripheral vascular disease, unspecified: Secondary | ICD-10-CM | POA: Diagnosis not present

## 2020-01-23 DIAGNOSIS — R8281 Pyuria: Secondary | ICD-10-CM | POA: Diagnosis not present

## 2020-01-23 DIAGNOSIS — D631 Anemia in chronic kidney disease: Secondary | ICD-10-CM | POA: Diagnosis not present

## 2020-01-23 DIAGNOSIS — I5032 Chronic diastolic (congestive) heart failure: Secondary | ICD-10-CM | POA: Diagnosis not present

## 2020-01-23 DIAGNOSIS — J449 Chronic obstructive pulmonary disease, unspecified: Secondary | ICD-10-CM | POA: Diagnosis not present

## 2020-01-23 DIAGNOSIS — I130 Hypertensive heart and chronic kidney disease with heart failure and stage 1 through stage 4 chronic kidney disease, or unspecified chronic kidney disease: Secondary | ICD-10-CM | POA: Diagnosis not present

## 2020-01-23 DIAGNOSIS — I69351 Hemiplegia and hemiparesis following cerebral infarction affecting right dominant side: Secondary | ICD-10-CM | POA: Diagnosis not present

## 2020-01-23 DIAGNOSIS — I350 Nonrheumatic aortic (valve) stenosis: Secondary | ICD-10-CM | POA: Diagnosis not present

## 2020-01-23 DIAGNOSIS — N1831 Chronic kidney disease, stage 3a: Secondary | ICD-10-CM | POA: Diagnosis not present

## 2020-01-23 NOTE — Telephone Encounter (Signed)
   Primary Cardiologist: Julien Nordmann, MD  Chart reviewed as part of pre-operative protocol coverage. Patient was contacted 01/23/2020 in reference to pre-operative risk assessment for pending surgery as outlined below.  [name] [name] was last seen on 01/03/20 by Ward Givens, NP.  Since that day, [name] [name] has done fine from a cardiac standpoint. She is quite limited in activity by her back pain and is unable to complete 4 METs as a result. She is able to go up 6 steps to get into the house without chest pain or SOB. She does not describe any anginal complaints. Breathing and swelling are well controlled. She had a recent echocardiogram 12/23/19 with EF 60-65% and no RWMA. She is planned for a kyphoplasty which is a low risk procedure.   Given poor functional status, will route to Dr. Mariah Milling for input, though favor going forward for with her planned procedure without further cardiovascular testing.   From a cardiac standpoint, no contraindication to holding plavix prior to her surgery. She has a history of strokes as well, therefore will defer final recommendations for holding plavix to patients PCP. Patient does report stopping her plavix as of yesterday.   I will route this recommendation to the requesting party via Epic fax function and remove from pre-op pool once I hear back from Dr. Mariah Milling.  Beatriz Stallion, PA-C 01/23/2020, 8:55 AM

## 2020-01-24 ENCOUNTER — Other Ambulatory Visit: Payer: Self-pay | Admitting: Family Medicine

## 2020-01-24 NOTE — Pre-Procedure Instructions (Addendum)
   [name] [name]  01/24/2020     Your procedure is scheduled on Wednesday, May 5 , 2021  Report to [facility] Admitting at 10:55 A.M.  Call this number if you have problems the morning of surgery:  [phone]   Remember: Brush your teeth the morning of surgery.   Do not eat or drink after midnight the night before surgery.   Take these medicines the morning of surgery with A SIP OF WATER:    levothyroxine (SYNTHROID)  sertraline (ZOLOFT) If needed: acetaminophen (TYLENOL) for mild pain or headache.  If needed: traMADol (ULTRAM) for moderate pain If needed: HYDROcodone-acetaminophen (NORCO/VICODIN) for pain If needed: ondansetron [facility]) for nausea or vomiting If needed: pantoprazole (PROTONIX) for reflux If needed: albuterol (PROVENTIL HFA;VENTOLIN HFA) inhaler for wheezing or shortness of breath. ( Bring inhaler in with you on day of surgery ).   Follow the surgeon's instructions regarding clopidogrel (PLAVIX).  If no pre-op instructions were provided , you will need to call the surgeon's office for those instructions.  Stop taking Aspirin (unless otherwise advised by surgeon),  all vitamins, fish oil. Melatonin, AZO CRANBERRY URINARY TRACT  and herbal medications. Do not take any NSAIDs ie: Ibuprofen, Advil, Naproxen (Aleve), Motrin, BC and Goody Powder; stop now.   Do not wear jewelry, make-up or nail polish.  Do not wear lotions, powders, or perfumes, or deodorant.  Do not shave 48 hours prior to surgery.  Do not bring valuables to the hospital.  [facility] is not responsible for any belongings or valuables.  Contacts, dentures or bridgework may not be worn into surgery.  Leave your suitcase in the car.  After surgery it may be brought to your room.  For patients admitted to the hospital, discharge time will be determined by your treatment team.  Patients discharged the day of surgery will not be allowed to drive home.   Special instructions: See " [facility] Preparing For Surgery " sheet.  Please read over the following fact sheets that you were given. Pain Booklet, Coughing and Deep Breathing and Surgical Site Infection Prevention

## 2020-01-26 NOTE — Telephone Encounter (Signed)
Acceptable risk to proceed with surgery and hold the Plavix No further testing

## 2020-01-27 ENCOUNTER — Other Ambulatory Visit: Payer: Self-pay

## 2020-01-27 ENCOUNTER — Encounter (HOSPITAL_COMMUNITY): Payer: Self-pay

## 2020-01-27 ENCOUNTER — Other Ambulatory Visit (HOSPITAL_COMMUNITY)
Admission: RE | Admit: 2020-01-27 | Discharge: 2020-01-27 | Disposition: A | Discharge status: Home / Self Care | Payer: Medicare HMO | Source: Ambulatory Visit | Attending: Neurosurgery | Admitting: Neurosurgery

## 2020-01-27 ENCOUNTER — Encounter (HOSPITAL_COMMUNITY)
Admission: RE | Admit: 2020-01-27 | Discharge: 2020-01-27 | Disposition: A | Discharge status: Home / Self Care | Payer: Medicare HMO | Source: Ambulatory Visit | Attending: Neurosurgery | Admitting: Neurosurgery

## 2020-01-27 DIAGNOSIS — F329 Major depressive disorder, single episode, unspecified: Secondary | ICD-10-CM | POA: Diagnosis not present

## 2020-01-27 DIAGNOSIS — E785 Hyperlipidemia, unspecified: Secondary | ICD-10-CM | POA: Diagnosis not present

## 2020-01-27 DIAGNOSIS — Z7982 Long term (current) use of aspirin: Secondary | ICD-10-CM | POA: Insufficient documentation

## 2020-01-27 DIAGNOSIS — I509 Heart failure, unspecified: Secondary | ICD-10-CM | POA: Insufficient documentation

## 2020-01-27 DIAGNOSIS — I739 Peripheral vascular disease, unspecified: Secondary | ICD-10-CM | POA: Diagnosis not present

## 2020-01-27 DIAGNOSIS — J449 Chronic obstructive pulmonary disease, unspecified: Secondary | ICD-10-CM | POA: Diagnosis not present

## 2020-01-27 DIAGNOSIS — Z01818 Encounter for other preprocedural examination: Secondary | ICD-10-CM | POA: Insufficient documentation

## 2020-01-27 DIAGNOSIS — X58XXXA Exposure to other specified factors, initial encounter: Secondary | ICD-10-CM | POA: Diagnosis not present

## 2020-01-27 DIAGNOSIS — Z87891 Personal history of nicotine dependence: Secondary | ICD-10-CM | POA: Diagnosis not present

## 2020-01-27 DIAGNOSIS — Z9981 Dependence on supplemental oxygen: Secondary | ICD-10-CM | POA: Diagnosis not present

## 2020-01-27 DIAGNOSIS — Z79899 Other long term (current) drug therapy: Secondary | ICD-10-CM | POA: Insufficient documentation

## 2020-01-27 DIAGNOSIS — Z20822 Contact with and (suspected) exposure to covid-19: Secondary | ICD-10-CM | POA: Insufficient documentation

## 2020-01-27 DIAGNOSIS — E039 Hypothyroidism, unspecified: Secondary | ICD-10-CM | POA: Diagnosis not present

## 2020-01-27 DIAGNOSIS — Z7983 Long term (current) use of bisphosphonates: Secondary | ICD-10-CM | POA: Insufficient documentation

## 2020-01-27 DIAGNOSIS — I110 Hypertensive heart disease with heart failure: Secondary | ICD-10-CM | POA: Diagnosis not present

## 2020-01-27 DIAGNOSIS — Z7902 Long term (current) use of antithrombotics/antiplatelets: Secondary | ICD-10-CM | POA: Diagnosis not present

## 2020-01-27 DIAGNOSIS — M810 Age-related osteoporosis without current pathological fracture: Secondary | ICD-10-CM | POA: Diagnosis not present

## 2020-01-27 DIAGNOSIS — G629 Polyneuropathy, unspecified: Secondary | ICD-10-CM | POA: Diagnosis not present

## 2020-01-27 DIAGNOSIS — S22079A Unspecified fracture of T9-T10 vertebra, initial encounter for closed fracture: Secondary | ICD-10-CM | POA: Insufficient documentation

## 2020-01-27 DIAGNOSIS — I2510 Atherosclerotic heart disease of native coronary artery without angina pectoris: Secondary | ICD-10-CM | POA: Diagnosis not present

## 2020-01-27 DIAGNOSIS — Z8673 Personal history of transient ischemic attack (TIA), and cerebral infarction without residual deficits: Secondary | ICD-10-CM | POA: Insufficient documentation

## 2020-01-27 DIAGNOSIS — Z79890 Hormone replacement therapy (postmenopausal): Secondary | ICD-10-CM | POA: Insufficient documentation

## 2020-01-27 HISTORY — DX: Heart failure, unspecified: I50.9

## 2020-01-27 HISTORY — DX: Polyneuropathy, unspecified: G62.9

## 2020-01-27 HISTORY — DX: Wedge compression fracture of unspecified thoracic vertebra, initial encounter for closed fracture: S22.000A

## 2020-01-27 HISTORY — DX: Hypothyroidism, unspecified: E03.9

## 2020-01-27 HISTORY — DX: Nonrheumatic aortic (valve) stenosis: I35.0

## 2020-01-27 HISTORY — DX: Other specified health status: Z78.9

## 2020-01-27 HISTORY — DX: Presence of dental prosthetic device (complete) (partial): Z97.2

## 2020-01-27 LAB — BASIC METABOLIC PANEL
Anion gap: 7 (ref 5–15)
BUN: 15 mg/dL (ref 8–23)
CO2: 27 mmol/L (ref 22–32)
Calcium: 9.1 mg/dL (ref 8.9–10.3)
Chloride: 106 mmol/L (ref 98–111)
Creatinine, Ser: 1.13 mg/dL — ABNORMAL HIGH (ref 0.44–1.00)
GFR calc Af Amer: 56 mL/min — ABNORMAL LOW (ref >60)
GFR calc non Af Amer: 48 mL/min — ABNORMAL LOW (ref >60)
Glucose, Bld: 137 mg/dL — ABNORMAL HIGH (ref 70–99)
Potassium: 4.6 mmol/L (ref 3.5–5.1)
Sodium: 140 mmol/L (ref 135–145)

## 2020-01-27 LAB — CBC
HCT: 42.1 % (ref 36.0–46.0)
Hemoglobin: 12.9 g/dL (ref 12.0–15.0)
MCH: 29.8 pg (ref 26.0–34.0)
MCHC: 30.6 g/dL (ref 30.0–36.0)
MCV: 97.2 fL (ref 80.0–100.0)
Platelets: 197 10*3/uL (ref 150–400)
RBC: 4.33 MIL/uL (ref 3.87–5.11)
RDW: 13.3 % (ref 11.5–15.5)
WBC: 7.4 10*3/uL (ref 4.0–10.5)
nRBC: 0 % (ref 0.0–0.2)

## 2020-01-27 LAB — SARS CORONAVIRUS 2 (TAT 6-24 HRS): SARS Coronavirus 2: NEGATIVE

## 2020-01-27 LAB — SURGICAL PCR SCREEN
MRSA, PCR: NEGATIVE
Staphylococcus aureus: NEGATIVE

## 2020-01-27 NOTE — Progress Notes (Signed)
Nurse lvm with Shanda Bumps, Surgical Coordinator, to follow up with pt regarding pre-op instructions for Aspirin.

## 2020-01-27 NOTE — Progress Notes (Signed)
Pt denies acute pulmonary issues. Pt denies chest pain. Pt stated that she is under the care of Dr. Mariah Milling, Cardiology, Dr. [name], PCP and pulmonologist name is unknown at this time. Pt denies recent labs. Pt stated that last dose of Plavix was 01/22/20. Pt denies having a cardiac cath. Pt denies recent labs. Pt reminded to quarantine. Pt verbalized understanding of all pre-op instructions. Pt chart forwarded to PA, Anesthesiology, for review.

## 2020-01-28 ENCOUNTER — Encounter (HOSPITAL_COMMUNITY): Payer: Self-pay

## 2020-01-28 NOTE — Anesthesia Preprocedure Evaluation (Addendum)
Anesthesia Evaluation  Patient identified by MRN, date of birth, ID band Patient awake    Reviewed: Allergy & Precautions, H&P , NPO status , Patient's Chart, lab work & pertinent test results  Airway Mallampati: II  TM Distance: >3 FB Neck ROM: Full    Dental no notable dental hx. (+) Upper Dentures, Dental Advisory Given   Pulmonary COPD, former smoker,    Pulmonary exam normal breath sounds clear to auscultation       Cardiovascular hypertension, Pt. on medications + Peripheral Vascular Disease and +CHF  + Valvular Problems/Murmurs AS  Rhythm:Regular Rate:Normal - Systolic murmurs    Neuro/Psych Anxiety Depression CVA, Residual Symptoms    GI/Hepatic negative GI ROS, Neg liver ROS,   Endo/Other  Hypothyroidism Morbid obesity  Renal/GU Renal InsufficiencyRenal disease  negative genitourinary   Musculoskeletal  (+) Arthritis ,   Abdominal   Peds  Hematology negative hematology ROS (+)   Anesthesia Other Findings   Reproductive/Obstetrics negative OB ROS                           Anesthesia Physical Anesthesia Plan  ASA: III  Anesthesia Plan: General   Post-op Pain Management:    Induction: Intravenous  PONV Risk Score and Plan: 3 and Ondansetron  Airway Management Planned: Oral ETT  Additional Equipment:   Intra-op Plan:   Post-operative Plan: Extubation in OR  Informed Consent: I have reviewed the patients History and Physical, chart, labs and discussed the procedure including the risks, benefits and alternatives for the proposed anesthesia with the patient or authorized representative who has indicated his/her understanding and acceptance.     Dental advisory given  Plan Discussed with: CRNA  Anesthesia Plan Comments: (PAT note written 01/28/2020 by Shonna Chock, PA-C. )       Anesthesia Quick Evaluation

## 2020-01-28 NOTE — Progress Notes (Signed)
Anesthesia Chart Review:  Case: [id number] Date/Time: 01/29/20 1241   Procedure: Thoracic 10 Kyphoplasty (N/A ) - 3C   Anesthesia type: General   Pre-op diagnosis: Compression fracture of thoracic 10 vertebra   Location: [facility] OR ROOM 21 / [place] [facility]   Surgeons: Ashok Pall, MD      DISCUSSION: Patient is a 74 year old female scheduled for the above procedure.  History includes former smoker (quit 11/08/12), HTN, CVA  (01/2006 right MCA and left subcortical infarcts; left MCA infarct 09/2008, s/p angioplasty LICA at the cavernous segment 11/05/08; 07/2018 left amaurosis fugax likely due to chronic progressive left ICA cavernous high grade stenosis, DPAT with no intervention recommended), carotid artery stenosis (~ 50% BICA 07/18/18 CTA), aortic stenosis (mild-moderate 12/23/19 echo), CHF, PVD, COPD (O2 at HS & PRN), HLD, hypothyroidism, neuropathy, fall with concussion/C5 fracture (08/06/15, treated with hard c-collar for comfort), L4 kyphoplasty (10/02/2017). She had minimal CAD on 2002 cath and a non-ischemic stress test in 2020.  She had 2.6 cm dilation of the infrarenal abdominal aorta on 08/01/15 CTA with 5 year [place] recommended.  BMI is consistent with obesity.  Preoperative medical input by Dr. Danise Mina classifies her as "Moderate Risk" with permission to hold Plavix for 5 days prior to surgery.  Preoperative input by cardiologist Dr. Rockey Situ on 01/26/20: "Acceptable risk to proceed with surgery and hold the Plavix No further testing". Last Plavix 01/22/20.  01/27/2020 presurgical COVID-19 test negative.  Anesthesia team to evaluate on the day of surgery.   VS: BP 112/65   Pulse (!) 102   Temp 36.9 C (Oral)   Resp 20   Wt 82.1 kg   SpO2 99%   BMI 36.56 kg/m    PROVIDERS: Ria Bush, MD his PCP Ida Rogue, MD is cardiologist Luanne Bras, MD is IR  Last evaluation by pulmonologist seen is from 07/30/18 with Laverle Hobby, MD   LABS: Labs reviewed: Acceptable for  surgery. (all labs ordered are listed, but only abnormal results are displayed)  Labs Reviewed  BASIC METABOLIC PANEL - Abnormal; Notable for the following components:      Result Value   Glucose, Bld 137 (*)    Creatinine, Ser 1.13 (*)    GFR calc non Af Amer 48 (*)    GFR calc Af Amer 56 (*)    All other components within normal limits  SURGICAL PCR SCREEN  CBC     IMAGES: MRI L-spine 12/25/19: IMPRESSION: 1. Acute/subacute T10 vertebral body fracture, incompletely visualized by this study. No osseous retropulsion or nerve root encroachment identified. 2. Healed L4 compression deformity status post spinal augmentation. Stable chronic T11 and L1 compression fractures. 3. Stable mild spondylosis with small disc protrusions as described. No significant spinal stenosis or nerve root encroachment.  CT Head 12/24/19: IMPRESSION: 1. No evidence of acute intracranial abnormality. 2. Advanced cerebral atrophy.  CT Chest 12/22/19: IMPRESSION: 1. No acute pulmonary embolism. 2. Findings suspicious for an acute or subacute mild compression fracture of the T10 vertebral body. 3. Bibasilar atelectasis with a trace right-sided pleural effusion. 4. Mild diffuse esophageal wall thickening is nonspecific but can be seen in patients with esophagitis. 5. Mild mucous plugging and bronchial wall thickening at the lung bases suggestive of reactive or infectious bronchiolitis. Aortic Atherosclerosis (ICD10-I70.0) and Emphysema (ICD10-J43.9).  CTA head/neck 07/18/18: IMPRESSION: 1. Advanced intracranial atherosclerosis including unchanged severe left cavernous ICA stenosis and stable to slight progression of severe right M1 and left P2 stenoses. 2. Unchanged atherosclerosis in the neck including 50%  right ICA origin stenosis, 50-55% left common carotid artery stenoses, severe right subclavian artery origin stenosis, and severe right and moderate left vertebral artery origin stenoses. 3. No  evidence of acute intracranial abnormality. 4. Unchanged frontoparietal atrophy. 5. Aortic Atherosclerosis (ICD10-I70.0) and Emphysema (ICD10-J43.9).   EKG: EKG 01/03/20: NSR, possible LAE, minimal voltage criteria for LVH, may be a normal variant.    CV: Echo 12/23/19: IMPRESSIONS  1. Left ventricular ejection fraction, by estimation, is 60 to 65%. The  left ventricle has normal function. The left ventricle has no regional  wall motion abnormalities. There is mild left ventricular hypertrophy.  Left ventricular diastolic parameters  are indeterminate.  2. Right ventricular systolic function is mildly reduced. The right  ventricular size is normal. Tricuspid regurgitation signal is inadequate  for assessing PA pressure.  3. The mitral valve is normal in structure. No evidence of mitral valve  regurgitation.  4. The aortic valve is abnormal. Moderately calcified. Aortic valve  regurgitation is mild. Mild to moderate aortic valve stenosis. Vmax 2.3  m/s, MG 14 mmHg, AVA 1.2 cm^2, DI 0.43   Nuclear stress test 01/25/19: IMPRESSION: 1. No reversible ischemia or infarction. 2. Normal left ventricular wall motion. 3. Left ventricular ejection fraction is 71%. 4. Non invasive risk stratification*: Low  Cardiac cath 03/09/01: OVERALL IMPRESSION: 1. Normal left ventricular function. 2. Mild diffuse coronary atherosclerosis. (20% distal LM, 20% LCX at level of OM1, 20-30% two-thirds down LAD and also just prior to crossing apex; 20-30% diffuse RCA, 30% ostial PDA)  DISCUSSION:  It is felt that the patient should be stable at this point in time.  She will need to modify cardiovascular risk factors.   Past Medical History:  Diagnosis Date  . (HFpEF) heart failure with preserved ejection fraction (HCC)    a. 11/2019 Echo: EF 60-65%, no rwma. Mild LVH. Mild AI, mild to mod AS.  [name] Aneurysm (HCC)    She had 2.6 cm dilation of the infrarenal abdominal aorta on 08/01/15 CTA with 5 year [place]  recommended  . Anxiety   . Anxiety and depression   . Aortic stenosis    mild-moderate AS 12/23/19 echo  . Carotid stenosis    R 50% (12/2012)  . CHF (congestive heart failure) (HCC)   . Compression fracture of body of thoracic vertebra (HCC)    T 10  . Concussion 08/03/2015  . COPD (chronic obstructive pulmonary disease) (HCC) 12/2012   spirometry: Pre: FVC 84%, FEV1 69%, ratio 0.64 consistent with moderate obstruction.  . Depression   . Fall 08/03/2015   d/c home health 08/2015  . Fracture of cervical vertebra, C5 (HCC) 08/06/2015  . History of chicken pox   . Hyperlipidemia   . Hypertension   . Hypothyroidism   . Lower back pain    h/o HNP s/p surgery  . Neuropathy    B/L feet  . On supplemental oxygen by nasal cannula    at HS and PRN during the day  . Osteoarthritis    h/o ruptured disc s/p ESI  . Osteoporosis 11/2010   DEXA -2.7 spine, thoracic compression fracture  . Peripheral vascular disease (HCC)   . Smoker    quit 10/2012  . Stroke [facility]) 2010   x3 with residual R hemiparesis, s/p R MCA balloon angioplasty (2010)  . Wears dentures    upper    Past Surgical History:  Procedure Laterality Date  . APPENDECTOMY  1960  . CATARACT EXTRACTION     bilateral  .  CESAREAN SECTION    . CHOLECYSTECTOMY  1970  . COLONOSCOPY  2004   diverticulosis, no polyps Jarold Motto)  . COLONOSCOPY  05/2016   decreased sphincter tone, diverticulosis, no f/u recommended (Danis)  . DEXA  11/2010   T -2.7 spine, -1.9 hip  . HIP SURGERY Left 2006   fractured - screws placed  . IR ANGIO INTRA EXTRACRAN SEL COM CAROTID INNOMINATE BILAT MOD SED  08/01/2018  . IR ANGIO INTRA EXTRACRAN SEL COM CAROTID INNOMINATE BILAT MOD SED  12/10/2018  . IR ANGIO VERTEBRAL SEL SUBCLAVIAN INNOMINATE UNI R MOD SED  08/01/2018  . IR ANGIO VERTEBRAL SEL SUBCLAVIAN INNOMINATE UNI R MOD SED  12/10/2018  . IR ANGIO VERTEBRAL SEL VERTEBRAL UNI L MOD SED  12/10/2018  . IR RADIOLOGIST EVAL & MGMT  01/09/2018  .  KYPHOPLASTY  10/02/2017   Procedure: LUMBAR FOUR KYPHOPLASTY;  Surgeon: Coletta Memos, MD;  Location: [facility] OR;  Service: Neurosurgery;;  . RADIOLOGY WITH ANESTHESIA N/A 11/11/2015   Procedure: RADIOLOGY WITH ANESTHESIA;  Surgeon: Julieanne Cotton, MD;  Location: [facility] OR;  Service: Radiology;  Laterality: N/A;  . RADIOLOGY WITH ANESTHESIA N/A 12/10/2018   Procedure: STENTING;  Surgeon: Julieanne Cotton, MD;  Location: [facility] OR;  Service: Radiology;  Laterality: N/A;    MEDICATIONS: . acetaminophen (TYLENOL) 500 MG tablet  . albuterol (PROVENTIL HFA;VENTOLIN HFA) 108 (90 Base) MCG/ACT inhaler  . alendronate (FOSAMAX) 70 MG tablet  . aspirin (ASPIRIN 81) 81 MG EC tablet  . atorvastatin (LIPITOR) 40 MG tablet  . Cholecalciferol (VITAMIN D3) 125 MCG (5000 UT) CAPS  . clopidogrel (PLAVIX) 75 MG tablet  . Cranberry-Vitamin C (AZO CRANBERRY URINARY TRACT PO)  . furosemide (LASIX) 40 MG tablet  . gabapentin (NEURONTIN) 100 MG capsule  . HYDROcodone-acetaminophen (NORCO/VICODIN) 5-325 MG tablet  . levothyroxine (SYNTHROID) 50 MCG tablet  . Melatonin 5 MG CHEW  . methocarbamol (ROBAXIN) 500 MG tablet  . Multiple Vitamins-Minerals (MULTIVITAMIN PO)  . ondansetron (ZOFRAN) 4 MG tablet  . OXYGEN  . pantoprazole (PROTONIX) 40 MG tablet  . Potassium Chloride ER 20 MEQ TBCR  . sertraline (ZOLOFT) 100 MG tablet  . sulfamethoxazole-trimethoprim (BACTRIM) 400-80 MG tablet  . traMADol (ULTRAM) 50 MG tablet  . traZODone (DESYREL) 100 MG tablet  . trimethoprim (TRIMPEX) 100 MG tablet  . vitamin C (ASCORBIC ACID) 250 MG tablet  . Zinc 30 MG TABS   No current facility-administered medications for this encounter.     Shonna Chock, PA-C Surgical Short Stay/Anesthesiology [facility] Phone [phone] [facility] Phone [phone] 01/28/2020 11:37 AM

## 2020-01-29 ENCOUNTER — Other Ambulatory Visit: Payer: Self-pay

## 2020-01-29 ENCOUNTER — Ambulatory Visit (HOSPITAL_COMMUNITY): Payer: Medicare HMO | Admitting: Anesthesiology

## 2020-01-29 ENCOUNTER — Ambulatory Visit (HOSPITAL_COMMUNITY): Payer: Medicare HMO | Admitting: Vascular Surgery

## 2020-01-29 ENCOUNTER — Ambulatory Visit (HOSPITAL_COMMUNITY)
Admission: RE | Admit: 2020-01-29 | Discharge: 2020-01-30 | Disposition: A | Discharge status: Home / Self Care | Payer: Medicare HMO | Attending: Neurosurgery | Admitting: Neurosurgery

## 2020-01-29 ENCOUNTER — Encounter (HOSPITAL_COMMUNITY)
Admission: RE | Disposition: A | Discharge status: Home / Self Care | Payer: Self-pay | Source: Home / Self Care | Attending: Neurosurgery

## 2020-01-29 ENCOUNTER — Encounter (HOSPITAL_COMMUNITY): Payer: Self-pay | Admitting: Neurosurgery

## 2020-01-29 ENCOUNTER — Ambulatory Visit (HOSPITAL_COMMUNITY): Payer: Medicare HMO

## 2020-01-29 DIAGNOSIS — J449 Chronic obstructive pulmonary disease, unspecified: Secondary | ICD-10-CM | POA: Insufficient documentation

## 2020-01-29 DIAGNOSIS — I6529 Occlusion and stenosis of unspecified carotid artery: Secondary | ICD-10-CM | POA: Diagnosis not present

## 2020-01-29 DIAGNOSIS — Z9981 Dependence on supplemental oxygen: Secondary | ICD-10-CM | POA: Diagnosis not present

## 2020-01-29 DIAGNOSIS — Z888 Allergy status to other drugs, medicaments and biological substances status: Secondary | ICD-10-CM | POA: Diagnosis not present

## 2020-01-29 DIAGNOSIS — S22079A Unspecified fracture of T9-T10 vertebra, initial encounter for closed fracture: Secondary | ICD-10-CM | POA: Diagnosis not present

## 2020-01-29 DIAGNOSIS — E785 Hyperlipidemia, unspecified: Secondary | ICD-10-CM | POA: Diagnosis not present

## 2020-01-29 DIAGNOSIS — I110 Hypertensive heart disease with heart failure: Secondary | ICD-10-CM | POA: Diagnosis not present

## 2020-01-29 DIAGNOSIS — S22070A Wedge compression fracture of T9-T10 vertebra, initial encounter for closed fracture: Secondary | ICD-10-CM | POA: Diagnosis not present

## 2020-01-29 DIAGNOSIS — E039 Hypothyroidism, unspecified: Secondary | ICD-10-CM | POA: Diagnosis not present

## 2020-01-29 DIAGNOSIS — I350 Nonrheumatic aortic (valve) stenosis: Secondary | ICD-10-CM | POA: Insufficient documentation

## 2020-01-29 DIAGNOSIS — N183 Chronic kidney disease, stage 3 unspecified: Secondary | ICD-10-CM | POA: Diagnosis not present

## 2020-01-29 DIAGNOSIS — Z87891 Personal history of nicotine dependence: Secondary | ICD-10-CM | POA: Diagnosis not present

## 2020-01-29 DIAGNOSIS — I130 Hypertensive heart and chronic kidney disease with heart failure and stage 1 through stage 4 chronic kidney disease, or unspecified chronic kidney disease: Secondary | ICD-10-CM | POA: Diagnosis not present

## 2020-01-29 DIAGNOSIS — I69351 Hemiplegia and hemiparesis following cerebral infarction affecting right dominant side: Secondary | ICD-10-CM | POA: Insufficient documentation

## 2020-01-29 DIAGNOSIS — F329 Major depressive disorder, single episode, unspecified: Secondary | ICD-10-CM | POA: Insufficient documentation

## 2020-01-29 DIAGNOSIS — F419 Anxiety disorder, unspecified: Secondary | ICD-10-CM | POA: Diagnosis not present

## 2020-01-29 DIAGNOSIS — Z881 Allergy status to other antibiotic agents status: Secondary | ICD-10-CM | POA: Insufficient documentation

## 2020-01-29 DIAGNOSIS — I739 Peripheral vascular disease, unspecified: Secondary | ICD-10-CM | POA: Insufficient documentation

## 2020-01-29 DIAGNOSIS — Z981 Arthrodesis status: Secondary | ICD-10-CM | POA: Diagnosis not present

## 2020-01-29 DIAGNOSIS — W19XXXA Unspecified fall, initial encounter: Secondary | ICD-10-CM | POA: Insufficient documentation

## 2020-01-29 DIAGNOSIS — Z6835 Body mass index (BMI) 35.0-35.9, adult: Secondary | ICD-10-CM | POA: Diagnosis not present

## 2020-01-29 DIAGNOSIS — Z419 Encounter for procedure for purposes other than remedying health state, unspecified: Secondary | ICD-10-CM

## 2020-01-29 DIAGNOSIS — M810 Age-related osteoporosis without current pathological fracture: Secondary | ICD-10-CM | POA: Diagnosis not present

## 2020-01-29 DIAGNOSIS — I5032 Chronic diastolic (congestive) heart failure: Secondary | ICD-10-CM | POA: Insufficient documentation

## 2020-01-29 HISTORY — PX: KYPHOPLASTY: SHX5884

## 2020-01-29 SURGERY — KYPHOPLASTY
Anesthesia: General | Site: Spine Thoracic

## 2020-01-29 MED ORDER — IOPAMIDOL (ISOVUE-300) INJECTION 61%
INTRAVENOUS | Status: DC | PRN
  Administered 2020-01-29: 50 mL

## 2020-01-29 MED ORDER — POTASSIUM CHLORIDE ER 10 MEQ PO TBCR
20.0000 meq | EXTENDED_RELEASE_TABLET | Freq: Every day | ORAL | Status: DC | PRN
  Filled 2020-01-29: qty 2

## 2020-01-29 MED ORDER — PHENYLEPHRINE 40 MCG/ML (10ML) SYRINGE FOR IV PUSH (FOR BLOOD PRESSURE SUPPORT)
PREFILLED_SYRINGE | INTRAVENOUS | Status: AC
  Filled 2020-01-29: qty 10

## 2020-01-29 MED ORDER — VITAMIN D 25 MCG (1000 UNIT) PO TABS
5000.0000 [IU] | ORAL_TABLET | Freq: Every day | ORAL | Status: DC
  Administered 2020-01-29 – 2020-01-30 (×2): 5000 [IU] via ORAL
  Filled 2020-01-29 (×2): qty 5

## 2020-01-29 MED ORDER — PHENYLEPHRINE HCL-NACL 10-0.9 MG/250ML-% IV SOLN
INTRAVENOUS | Status: DC | PRN
  Administered 2020-01-29: 30 ug/min via INTRAVENOUS

## 2020-01-29 MED ORDER — PANTOPRAZOLE SODIUM 40 MG PO TBEC: 40.0000 mg | DELAYED_RELEASE_TABLET | Freq: Every day | ORAL | Status: DC | PRN

## 2020-01-29 MED ORDER — DEXAMETHASONE SODIUM PHOSPHATE 10 MG/ML IJ SOLN
INTRAMUSCULAR | Status: DC | PRN
  Administered 2020-01-29: 4 mg via INTRAVENOUS

## 2020-01-29 MED ORDER — ONDANSETRON HCL 4 MG/2ML IJ SOLN
INTRAMUSCULAR | Status: AC
  Filled 2020-01-29: qty 2

## 2020-01-29 MED ORDER — ALBUTEROL SULFATE HFA 108 (90 BASE) MCG/ACT IN AERS
INHALATION_SPRAY | RESPIRATORY_TRACT | Status: DC | PRN
  Administered 2020-01-29: 4 via RESPIRATORY_TRACT

## 2020-01-29 MED ORDER — ALBUTEROL SULFATE (2.5 MG/3ML) 0.083% IN NEBU: 3.0000 mL | INHALATION_SOLUTION | Freq: Four times a day (QID) | RESPIRATORY_TRACT | Status: DC | PRN

## 2020-01-29 MED ORDER — LIDOCAINE 2% (20 MG/ML) 5 ML SYRINGE
INTRAMUSCULAR | Status: AC
  Filled 2020-01-29: qty 5

## 2020-01-29 MED ORDER — ALBUTEROL SULFATE HFA 108 (90 BASE) MCG/ACT IN AERS
INHALATION_SPRAY | RESPIRATORY_TRACT | Status: AC
  Filled 2020-01-29: qty 6.7

## 2020-01-29 MED ORDER — POTASSIUM CHLORIDE IN NACL 20-0.9 MEQ/L-% IV SOLN: INTRAVENOUS | Status: DC

## 2020-01-29 MED ORDER — ASPIRIN EC 81 MG PO TBEC
81.0000 mg | DELAYED_RELEASE_TABLET | Freq: Every day | ORAL | Status: DC
  Administered 2020-01-29 – 2020-01-30 (×2): 81 mg via ORAL
  Filled 2020-01-29 (×2): qty 1

## 2020-01-29 MED ORDER — FUROSEMIDE 20 MG PO TABS: 20.0000 mg | ORAL_TABLET | Freq: Every day | ORAL | Status: DC | PRN

## 2020-01-29 MED ORDER — ADULT MULTIVITAMIN W/MINERALS CH
1.0000 | ORAL_TABLET | Freq: Every day | ORAL | Status: DC
  Administered 2020-01-29: 1 via ORAL
  Filled 2020-01-29: qty 1

## 2020-01-29 MED ORDER — SUGAMMADEX SODIUM 200 MG/2ML IV SOLN
INTRAVENOUS | Status: DC | PRN
  Administered 2020-01-29: 200 mg via INTRAVENOUS

## 2020-01-29 MED ORDER — 0.9 % SODIUM CHLORIDE (POUR BTL) OPTIME
TOPICAL | Status: DC | PRN
  Administered 2020-01-29: 1000 mL

## 2020-01-29 MED ORDER — CRANBERRY-VITAMIN C 250-60 MG PO CAPS: 1.0000 | ORAL_CAPSULE | Freq: Two times a day (BID) | ORAL | Status: DC

## 2020-01-29 MED ORDER — ROCURONIUM BROMIDE 10 MG/ML (PF) SYRINGE
PREFILLED_SYRINGE | INTRAVENOUS | Status: DC | PRN
  Administered 2020-01-29: 60 mg via INTRAVENOUS
  Administered 2020-01-29: 15 mg via INTRAVENOUS

## 2020-01-29 MED ORDER — CEFAZOLIN SODIUM 1 G IJ SOLR
INTRAMUSCULAR | Status: AC
  Filled 2020-01-29: qty 20

## 2020-01-29 MED ORDER — LEVOTHYROXINE SODIUM 25 MCG PO TABS
50.0000 ug | ORAL_TABLET | Freq: Every day | ORAL | Status: DC
  Administered 2020-01-30: 50 ug via ORAL
  Filled 2020-01-29: qty 2

## 2020-01-29 MED ORDER — LIDOCAINE-EPINEPHRINE 0.5 %-1:200000 IJ SOLN
INTRAMUSCULAR | Status: AC
  Filled 2020-01-29: qty 1

## 2020-01-29 MED ORDER — SERTRALINE HCL 50 MG PO TABS
100.0000 mg | ORAL_TABLET | Freq: Every day | ORAL | Status: DC
  Administered 2020-01-30: 100 mg via ORAL
  Filled 2020-01-29: qty 2

## 2020-01-29 MED ORDER — TRAZODONE HCL 100 MG PO TABS
100.0000 mg | ORAL_TABLET | Freq: Every day | ORAL | Status: DC
  Administered 2020-01-29: 100 mg via ORAL
  Filled 2020-01-29 (×2): qty 1

## 2020-01-29 MED ORDER — PROPOFOL 10 MG/ML IV BOLUS
INTRAVENOUS | Status: AC
  Filled 2020-01-29: qty 20

## 2020-01-29 MED ORDER — GABAPENTIN 100 MG PO CAPS
100.0000 mg | ORAL_CAPSULE | Freq: Every day | ORAL | Status: DC
  Administered 2020-01-29: 100 mg via ORAL
  Filled 2020-01-29: qty 1

## 2020-01-29 MED ORDER — LIDOCAINE-EPINEPHRINE 0.5 %-1:200000 IJ SOLN
INTRAMUSCULAR | Status: DC | PRN
  Administered 2020-01-29: 6 mL

## 2020-01-29 MED ORDER — ASCORBIC ACID 500 MG PO TABS
500.0000 mg | ORAL_TABLET | Freq: Every day | ORAL | Status: DC
  Administered 2020-01-29 – 2020-01-30 (×2): 500 mg via ORAL
  Filled 2020-01-29: qty 1

## 2020-01-29 MED ORDER — ONDANSETRON HCL 4 MG/2ML IJ SOLN
INTRAMUSCULAR | Status: DC | PRN
  Administered 2020-01-29: 4 mg via INTRAVENOUS

## 2020-01-29 MED ORDER — FENTANYL CITRATE (PF) 100 MCG/2ML IJ SOLN
INTRAMUSCULAR | Status: DC | PRN
  Administered 2020-01-29 (×3): 50 ug via INTRAVENOUS

## 2020-01-29 MED ORDER — PROPOFOL 10 MG/ML IV BOLUS
INTRAVENOUS | Status: DC | PRN
  Administered 2020-01-29: 150 mg via INTRAVENOUS

## 2020-01-29 MED ORDER — DEXAMETHASONE SODIUM PHOSPHATE 10 MG/ML IJ SOLN
INTRAMUSCULAR | Status: AC
  Filled 2020-01-29: qty 1

## 2020-01-29 MED ORDER — ATORVASTATIN CALCIUM 40 MG PO TABS
40.0000 mg | ORAL_TABLET | Freq: Every day | ORAL | Status: DC
  Administered 2020-01-29 – 2020-01-30 (×2): 40 mg via ORAL
  Filled 2020-01-29 (×2): qty 1

## 2020-01-29 MED ORDER — LACTATED RINGERS IV SOLN: INTRAVENOUS | Status: DC | PRN

## 2020-01-29 MED ORDER — MELATONIN 5 MG PO TABS
5.0000 mg | ORAL_TABLET | Freq: Every day | ORAL | Status: DC
  Administered 2020-01-29: 5 mg via ORAL
  Filled 2020-01-29: qty 1

## 2020-01-29 MED ORDER — TRIMETHOPRIM 100 MG PO TABS
100.0000 mg | ORAL_TABLET | Freq: Every day | ORAL | Status: DC
  Administered 2020-01-30: 100 mg via ORAL
  Filled 2020-01-29: qty 1

## 2020-01-29 MED ORDER — ROCURONIUM BROMIDE 10 MG/ML (PF) SYRINGE
PREFILLED_SYRINGE | INTRAVENOUS | Status: AC
  Filled 2020-01-29: qty 20

## 2020-01-29 MED ORDER — PHENYLEPHRINE HCL (PRESSORS) 10 MG/ML IV SOLN
INTRAVENOUS | Status: DC | PRN
  Administered 2020-01-29: 200 ug via INTRAVENOUS
  Administered 2020-01-29 (×2): 120 ug via INTRAVENOUS

## 2020-01-29 MED ORDER — ZINC 30 MG PO TABS: 30.0000 mg | ORAL_TABLET | Freq: Every day | ORAL | Status: DC

## 2020-01-29 MED ORDER — LIDOCAINE 2% (20 MG/ML) 5 ML SYRINGE
INTRAMUSCULAR | Status: DC | PRN
  Administered 2020-01-29: 80 mg via INTRAVENOUS

## 2020-01-29 MED ORDER — METHOCARBAMOL 500 MG PO TABS: 500.0000 mg | ORAL_TABLET | Freq: Every evening | ORAL | Status: DC | PRN

## 2020-01-29 MED ORDER — TRAMADOL HCL 50 MG PO TABS
50.0000 mg | ORAL_TABLET | Freq: Four times a day (QID) | ORAL | Status: DC | PRN
  Administered 2020-01-29 – 2020-01-30 (×2): 50 mg via ORAL
  Filled 2020-01-29 (×2): qty 1

## 2020-01-29 MED ORDER — FENTANYL CITRATE (PF) 250 MCG/5ML IJ SOLN
INTRAMUSCULAR | Status: AC
  Filled 2020-01-29: qty 5

## 2020-01-29 MED ORDER — CEFAZOLIN SODIUM-DEXTROSE 2-3 GM-%(50ML) IV SOLR
INTRAVENOUS | Status: DC | PRN
  Administered 2020-01-29: 2 g via INTRAVENOUS

## 2020-01-29 MED ORDER — HYDROCODONE-ACETAMINOPHEN 5-325 MG PO TABS: 1.0000 | ORAL_TABLET | ORAL | Status: DC | PRN

## 2020-01-29 SURGICAL SUPPLY — 37 items
ADH SKN CLS APL DERMABOND .7 (GAUZE/BANDAGES/DRESSINGS) ×1
BLADE SURG 15 STRL LF DISP TIS (BLADE) ×1 IMPLANT
BLADE SURG 15 STRL SS (BLADE) ×3
CEMENT BONE KYPHX HV R (Orthopedic Implant) ×2 IMPLANT
DERMABOND ADVANCED (GAUZE/BANDAGES/DRESSINGS) ×2
DERMABOND ADVANCED .7 DNX12 (GAUZE/BANDAGES/DRESSINGS) ×1 IMPLANT
DRAPE C-ARM 42X72 X-RAY (DRAPES) ×3 IMPLANT
DRAPE HALF SHEET 40X57 (DRAPES) ×3 IMPLANT
DRAPE LAPAROTOMY 100X72X124 (DRAPES) ×3 IMPLANT
DRAPE SURG 17X23 STRL (DRAPES) ×3 IMPLANT
DRAPE WARM FLUID 44X44 (DRAPES) ×3 IMPLANT
DURAPREP 26ML APPLICATOR (WOUND CARE) ×3 IMPLANT
GAUZE 4X4 16PLY RFD (DISPOSABLE) ×3 IMPLANT
GLOVE BIOGEL PI IND STRL 7.0 (GLOVE) IMPLANT
GLOVE BIOGEL PI IND STRL 7.5 (GLOVE) IMPLANT
GLOVE BIOGEL PI INDICATOR 7.0 (GLOVE) ×6
GLOVE BIOGEL PI INDICATOR 7.5 (GLOVE) ×6
GLOVE ECLIPSE 6.5 STRL STRAW (GLOVE) ×3 IMPLANT
GLOVE ECLIPSE 7.5 STRL STRAW (GLOVE) ×4 IMPLANT
GLOVE ECLIPSE 8.0 STRL XLNG CF (GLOVE) ×4 IMPLANT
GOWN STRL REUS W/ TWL LRG LVL3 (GOWN DISPOSABLE) ×2 IMPLANT
GOWN STRL REUS W/ TWL XL LVL3 (GOWN DISPOSABLE) IMPLANT
GOWN STRL REUS W/TWL 2XL LVL3 (GOWN DISPOSABLE) ×2 IMPLANT
GOWN STRL REUS W/TWL LRG LVL3 (GOWN DISPOSABLE) ×6
GOWN STRL REUS W/TWL XL LVL3 (GOWN DISPOSABLE)
KIT BASIN OR (CUSTOM PROCEDURE TRAY) ×3 IMPLANT
KIT TURNOVER KIT B (KITS) ×3 IMPLANT
NDL HYPO 25X1 1.5 SAFETY (NEEDLE) ×1 IMPLANT
NEEDLE HYPO 25X1 1.5 SAFETY (NEEDLE) ×3 IMPLANT
NS IRRIG 1000ML POUR BTL (IV SOLUTION) ×3 IMPLANT
PACK SURGICAL SETUP 50X90 (CUSTOM PROCEDURE TRAY) ×3 IMPLANT
STAPLER SKIN PROX WIDE 3.9 (STAPLE) ×3 IMPLANT
SUT VIC AB 3-0 SH 8-18 (SUTURE) ×3 IMPLANT
SYR CONTROL 10ML LL (SYRINGE) ×6 IMPLANT
TOWEL GREEN STERILE (TOWEL DISPOSABLE) ×3 IMPLANT
TOWEL GREEN STERILE FF (TOWEL DISPOSABLE) ×3 IMPLANT
TRAY KYPHOPAK 15/3 ONESTEP 1ST (MISCELLANEOUS) ×2 IMPLANT

## 2020-01-29 NOTE — Anesthesia Procedure Notes (Signed)
Procedure Name: Intubation Date/Time: 01/29/2020 4:34 PM Performed by: Inda Coke, CRNA Pre-anesthesia Checklist: Patient identified, Emergency Drugs available, Suction available and Patient being monitored Patient Re-evaluated:Patient Re-evaluated prior to induction Oxygen Delivery Method: Circle System Utilized Preoxygenation: Pre-oxygenation with 100% oxygen Induction Type: IV induction Ventilation: Mask ventilation without difficulty Laryngoscope Size: Mac and 3 Grade View: Grade I Tube type: Oral Tube size: 7.0 mm Number of attempts: 1 Airway Equipment and Method: Stylet and Oral airway Placement Confirmation: ETT inserted through vocal cords under direct vision,  positive ETCO2 and breath sounds checked- equal and bilateral Secured at: 21 cm Tube secured with: Tape Dental Injury: Teeth and Oropharynx as per pre-operative assessment

## 2020-01-29 NOTE — Op Note (Signed)
01/29/2020  5:30 PM  PATIENT:  [name] [name]  74 y.o. female with an acute T10 fracture admitted for a kyphoplasty  PRE-OPERATIVE DIAGNOSIS:  Compression fracture of thoracic 10 vertebra  POST-OPERATIVE DIAGNOSIS:  Compression fracture of thoracic 10 vertebra  PROCEDURE:  Procedure(s): Thoracic Ten Kyphoplasty  SURGEON:  Surgeon(s): Coletta Memos, MD  ANESTHESIA:   general  EBL:  No intake/output data recorded.  BLOOD ADMINISTERED:none  COUNT:per nursing  SPECIMEN:  No Specimen  DICTATION: Mrs. Imhoff was taken to the operating room, intubated and placed under a general anesthetic without difficulty. Shewas positioned prone on the operating room table with all pressure points properly padded. Her back was prepped and draped in a sterile manner. With fluoroscopy I localized the T10 pedicles bilaterally. I injected lidocaine into the entry sites on both the left and right sides. I started by making a stab incision on the right side and entering the rightT10 pedicle with fluoroscopic guidance. Once good position was obtained, I drilled into the vertebral body. I then placed the kyphoplasty balloon into the T10 vertebra and inflated the balloon. I then inserted 3cc of methylmethacrylate into the vertebral body under fluoroscopic guidance. I achieved a good fill of the cavity, staying within the confines of the vertebral body. I removed the instrumentation from the vertebral body, and the final films looked good. I closed the stab incision with vicryl suture and used Dermabond for a sterile dressing. I cannulated the left T10 vertbra via the pedicle. However there was an anterior breach of the vertebral body.  I did not try to inflate the balloon or achieve a full fill. I injected approximately .3 cc methylmethracrylate.    PLAN OF CARE: Admit to inpatient   PATIENT DISPOSITION:  PACU - hemodynamically stable.   Delay start of Pharmacological VTE agent (>24hrs) due to surgical blood  loss or risk of bleeding:  yes

## 2020-01-29 NOTE — Anesthesia Postprocedure Evaluation (Signed)
Anesthesia Post Note  Patient: [name] [name]  Procedure(s) Performed: Thoracic Ten Kyphoplasty (N/A Spine Thoracic)     Patient location during evaluation: PACU Anesthesia Type: General Level of consciousness: awake Pain management: pain level controlled Vital Signs Assessment: post-procedure vital signs reviewed and stable Respiratory status: spontaneous breathing Cardiovascular status: stable Postop Assessment: no apparent nausea or vomiting Anesthetic complications: no    Last Vitals:  Vitals:   01/29/20 1815 01/29/20 1830  BP: (!) 124/52 125/74  Pulse: 86 83  Resp: 15 14  Temp:    SpO2: 98% 100%    Last Pain:  Vitals:   01/29/20 1740  TempSrc:   PainSc: 0-No pain                 Shelbi Vaccaro

## 2020-01-29 NOTE — Transfer of Care (Signed)
Immediate Anesthesia Transfer of Care Note  Patient: [name] [name]  Procedure(s) Performed: Thoracic Ten Kyphoplasty (N/A Spine Thoracic)  Patient Location: PACU  Anesthesia Type:General  Level of Consciousness: awake, alert  and oriented  Airway & Oxygen Therapy: Patient Spontanous Breathing and Patient connected to nasal cannula oxygen  Post-op Assessment: Report given to RN, Post -op Vital signs reviewed and stable and Patient moving all extremities X 4  Post vital signs: Reviewed and stable  Last Vitals:  Vitals Value Taken Time  BP 140/55 01/29/20 1740  Temp    Pulse 92 01/29/20 1741  Resp 14 01/29/20 1741  SpO2 96 % 01/29/20 1741  Vitals shown include unvalidated device data.  Last Pain:  Vitals:   01/29/20 1110  TempSrc:   PainSc: 5       Patients Stated Pain Goal: 2 (01/29/20 1110)  Complications: No apparent anesthesia complications

## 2020-01-29 NOTE — H&P (Signed)
BP (!) 139/45   Pulse 83   Temp 97.9 F (36.6 C) (Tympanic)   Resp 18   Ht 4\' 11"  (1.499 m)   Wt 79.8 kg   SpO2 99%   BMI 35.55 kg/m  [name] [name] is a patient of mine who I did a kyphoplasty on at L4 in the past.  She returns.  She has been hospitalized and seen by Dr. Zada Finders, or at least Dr. Zada Finders looked at the chart and made a recommendation.  She comes in today for a new T10 fracture.  She has had horrible spasms since December 20, 2019, when she took a fall.  Things are now worse.  The x-ray did not show anything new by their account (she came with her daughter), but an MRI showed a T10 fracture.  She is 60 inches, weighs 185 pounds.  Blood pressure is 124/79.  Pulse is 109.  Temperature is 97.6.  Pain is 3/10.  She is walking with a walker today slowly and deliberately.  She does have on a brace with a thoracic extension, lumbosacral orthosis with a thoracic extension.  She is alert, oriented by 4, answers all questions appropriately.  Memory, language, attention span, and fund of knowledge are normal.  Strength is full in the upper and lower extremities.  1+ reflex at the knees, trace at the ankles.     The MRI shows an acute fracture at T10. Allergies  Allergen Reactions  . Doxycycline Nausea Only and Other (See Comments)    Sig GI upset  . Fluticasone-Salmeterol Anxiety   Past Medical History:  Diagnosis Date  . (HFpEF) heart failure with preserved ejection fraction ([place])    a. 11/2019 Echo: EF 60-65%, no rwma. Mild LVH. Mild AI, mild to mod AS.  [name] Aneurysm ([place])    She had 2.6 cm dilation of the infrarenal abdominal aorta on 08/01/15 CTA with 5 year [place] recommended  . Anxiety   . Anxiety and depression   . Aortic stenosis    mild-moderate AS 12/23/19 echo  . Carotid stenosis    R 50% (12/2012)  . CHF (congestive heart failure) ([place])   . Compression fracture of body of thoracic vertebra ([place])    T 10  . Concussion 08/03/2015  . COPD (chronic obstructive pulmonary  disease) ([place]) 12/2012   spirometry: Pre: FVC 84%, FEV1 69%, ratio 0.64 consistent with moderate obstruction.  . Depression   . Fall 08/03/2015   d/c home health 08/2015  . Fracture of cervical vertebra, C5 (HCC) 08/06/2015  . History of chicken pox   . Hyperlipidemia   . Hypertension   . Hypothyroidism   . Lower back pain    h/o HNP s/p surgery  . Neuropathy    B/L feet  . On supplemental oxygen by nasal cannula    at HS and PRN during the day  . Osteoarthritis    h/o ruptured disc s/p ESI  . Osteoporosis 11/2010   DEXA -2.7 spine, thoracic compression fracture  . Peripheral vascular disease ([place])   . Smoker    quit 10/2012  . Stroke [facility]) 2010   x3 with residual R hemiparesis, s/p R MCA balloon angioplasty (2010)  . Wears dentures    upper   Past Surgical History:  Procedure Laterality Date  . APPENDECTOMY  1960  . CATARACT EXTRACTION     bilateral  . CESAREAN SECTION    . CHOLECYSTECTOMY  1970  . COLONOSCOPY  2004   diverticulosis, no polyps Sharlett Iles)  .  COLONOSCOPY  05/2016   decreased sphincter tone, diverticulosis, no f/u recommended (Danis)  . DEXA  11/2010   T -2.7 spine, -1.9 hip  . HIP SURGERY Left 2006   fractured - screws placed  . IR ANGIO INTRA EXTRACRAN SEL COM CAROTID INNOMINATE BILAT MOD SED  08/01/2018  . IR ANGIO INTRA EXTRACRAN SEL COM CAROTID INNOMINATE BILAT MOD SED  12/10/2018  . IR ANGIO VERTEBRAL SEL SUBCLAVIAN INNOMINATE UNI R MOD SED  08/01/2018  . IR ANGIO VERTEBRAL SEL SUBCLAVIAN INNOMINATE UNI R MOD SED  12/10/2018  . IR ANGIO VERTEBRAL SEL VERTEBRAL UNI L MOD SED  12/10/2018  . IR RADIOLOGIST EVAL & MGMT  01/09/2018  . KYPHOPLASTY  10/02/2017   Procedure: LUMBAR FOUR KYPHOPLASTY;  Surgeon: Coletta Memos, MD;  Location: [facility] OR;  Service: Neurosurgery;;  . RADIOLOGY WITH ANESTHESIA N/A 11/11/2015   Procedure: RADIOLOGY WITH ANESTHESIA;  Surgeon: Julieanne Cotton, MD;  Location: [facility] OR;  Service: Radiology;  Laterality: N/A;  . RADIOLOGY WITH  ANESTHESIA N/A 12/10/2018   Procedure: STENTING;  Surgeon: Julieanne Cotton, MD;  Location: [facility] OR;  Service: Radiology;  Laterality: N/A;   Family History  Problem Relation Age of Onset  . CAD Mother        MI  . Cancer Mother        cervical  . CAD Maternal Aunt        MI  . CAD Sister        MI  . Sudden death Father 14       died in his sleep, chain smoker  . Cirrhosis Father   . Hypertension Daughter   . Diabetes Maternal Aunt   . Cancer Maternal Grandmother        cervical        I will try to get [name] [name] in as soon as we can.  She takes Plavix, so she is going to need to be off that for at the very least a week.  She took today's dose, so this will start tomorrow.

## 2020-01-30 ENCOUNTER — Encounter: Payer: Self-pay | Admitting: *Deleted

## 2020-01-30 DIAGNOSIS — S22079A Unspecified fracture of T9-T10 vertebra, initial encounter for closed fracture: Secondary | ICD-10-CM | POA: Diagnosis not present

## 2020-01-30 DIAGNOSIS — Z888 Allergy status to other drugs, medicaments and biological substances status: Secondary | ICD-10-CM | POA: Diagnosis not present

## 2020-01-30 DIAGNOSIS — I350 Nonrheumatic aortic (valve) stenosis: Secondary | ICD-10-CM | POA: Diagnosis not present

## 2020-01-30 DIAGNOSIS — Z881 Allergy status to other antibiotic agents status: Secondary | ICD-10-CM | POA: Diagnosis not present

## 2020-01-30 DIAGNOSIS — F419 Anxiety disorder, unspecified: Secondary | ICD-10-CM | POA: Diagnosis not present

## 2020-01-30 DIAGNOSIS — F329 Major depressive disorder, single episode, unspecified: Secondary | ICD-10-CM | POA: Diagnosis not present

## 2020-01-30 DIAGNOSIS — I6529 Occlusion and stenosis of unspecified carotid artery: Secondary | ICD-10-CM | POA: Diagnosis not present

## 2020-01-30 DIAGNOSIS — I5032 Chronic diastolic (congestive) heart failure: Secondary | ICD-10-CM | POA: Diagnosis not present

## 2020-01-30 DIAGNOSIS — I110 Hypertensive heart disease with heart failure: Secondary | ICD-10-CM | POA: Diagnosis not present

## 2020-01-30 NOTE — Plan of Care (Signed)
Pt and daughter given D/C instructions with verbal understanding. Pt's incision is clean and dry with no sign of infection. Pt's IV was removed prior to D/C. Pt D/C'd home via wheelchair per MD order. Pt is stable @ D/C and has no other needs at this time. Jozey Janco, RN 

## 2020-01-30 NOTE — Discharge Summary (Signed)
Physician Discharge Summary  Patient ID: [name] [name] MRN: [id number] DOB/AGE: 01-25-1946 74 y.o.  Admit date: 01/29/2020 Discharge date: 01/30/2020  Admission Diagnoses:T10 fracture, compression  Discharge Diagnoses:  Active Problems:   Closed T10 fracture [facility])   Discharged Condition: good  Hospital Course: [name] [name] underwent an uncomplicated kyphoplasty at T10 yesterday. She has done well, ambulating, voiding, and tolerating a regular diet. Moving all extremities well  Treatments: surgery: Kyphoplasty T10  Discharge Exam: Blood pressure 110/69, pulse (!) 118, temperature 98.4 F (36.9 C), temperature source Oral, resp. rate 18, height 4\' 11"  (1.499 m), weight 79.8 kg, SpO2 91 %. General appearance: alert, cooperative, appears stated age and no distress  Disposition: Discharge disposition: 01-Home or Self Care      Compression fracture of thoracic 10 vertebra  Allergies as of 01/30/2020      Reactions   Doxycycline Nausea Only, Other (See Comments)   Sig GI upset   Fluticasone-salmeterol Anxiety      Medication List    TAKE these medications   acetaminophen 500 MG tablet Commonly known as: TYLENOL Take 1 tablet (500 mg total) by mouth every 6 (six) hours as needed for headache. What changed: reasons to take this   albuterol 108 (90 Base) MCG/ACT inhaler Commonly known as: VENTOLIN HFA Inhale 2 puffs into the lungs every 6 (six) hours as needed for wheezing or shortness of breath.   alendronate 70 MG tablet Commonly known as: FOSAMAX TAKE 1 TABLET WEEKLY. TAKE WITH A FULL GLASS OF WATER ON AN EMPTY STOMACH. What changed:   how much to take  how to take this  when to take this  additional instructions   aspirin 81 MG EC tablet Commonly known as: Aspirin 81 Take 1 tablet (81 mg total) by mouth daily. Swallow whole.   atorvastatin 40 MG tablet Commonly known as: LIPITOR TAKE 1 TABLET EVERY DAY   AZO CRANBERRY URINARY TRACT PO Take 1 tablet  by mouth in the morning and at bedtime.   clopidogrel 75 MG tablet Commonly known as: PLAVIX TAKE 1 TABLET (75 MG TOTAL) BY MOUTH DAILY.   furosemide 40 MG tablet Commonly known as: LASIX Take 20 mg by mouth daily as needed for fluid. Weight every morning before given   gabapentin 100 MG capsule Commonly known as: NEURONTIN TAKE 1 CAPSULE (100 MG TOTAL) BY MOUTH AT BEDTIME.   HYDROcodone-acetaminophen 5-325 MG tablet Commonly known as: NORCO/VICODIN Take 1 tablet by mouth 3 (three) times daily as needed for moderate pain (If not controlled by tramadol).   levothyroxine 50 MCG tablet Commonly known as: SYNTHROID TAKE 1 TABLET  DAILY BEFORE BREAKFAST.   Melatonin 5 MG Chew Chew 5 mg by mouth daily. Gummies   methocarbamol 500 MG tablet Commonly known as: ROBAXIN Take 1 tablet (500 mg total) by mouth every 8 (eight) hours as needed for muscle spasms. What changed: when to take this   MULTIVITAMIN PO Take 1 tablet by mouth daily. Gummie   ondansetron 4 MG tablet Commonly known as: Zofran Take 1 tablet (4 mg total) by mouth every 8 (eight) hours as needed for nausea or vomiting.   OXYGEN Inhale 2 L/min into the lungs See admin instructions. Inhale 2 L/min of oxygen at bedtime and during the day as needed for shortness of breath   pantoprazole 40 MG tablet Commonly known as: PROTONIX Take 1 tablet (40 mg total) by mouth daily as needed. What changed: reasons to take this   Potassium Chloride ER 20  MEQ Tbcr TAKE 2 TABLETS BY MOUTH AS NEEDED (WHENEVER  YOU  TAKE  LASIX) What changed: See the new instructions.   sertraline 100 MG tablet Commonly known as: ZOLOFT TAKE 1 TABLET EVERY DAY   sulfamethoxazole-trimethoprim 400-80 MG tablet Commonly known as: BACTRIM Take 0.5 tablets by mouth daily.   traMADol 50 MG tablet Commonly known as: ULTRAM Take 1-2 tablets (50-100 mg total) by mouth 2 (two) times daily as needed for moderate pain.   traZODone 100 MG  tablet Commonly known as: DESYREL Take 1.5 tablets (150 mg total) by mouth at bedtime. What changed: how much to take   trimethoprim 100 MG tablet Commonly known as: TRIMPEX Take 1 tablet (100 mg total) by mouth daily. Take daily to prevent UTI   vitamin C 250 MG tablet Commonly known as: ASCORBIC ACID Take 500 mg by mouth daily. Gummies   Vitamin D3 125 MCG (5000 UT) Caps Take 5,000 Units by mouth daily. Gummies   Zinc 30 MG Tabs Take 30 mg by mouth daily. Gummies      Follow-up Information    Ashok Pall, MD Follow up in 3 week(s).   Specialty: Neurosurgery Why: please call to make an appointment Contact information: [address] [place] [address] [phone]           Signed: Ashok Pall 01/30/2020, 1:08 PM

## 2020-01-30 NOTE — Discharge Instructions (Signed)
Care After ?These instructions give you information on caring for yourself after your procedure. Your doctor may also give you more specific instructions. Call your doctor if you have any problems or questions after your procedure. ?HOME CARE ?Take medicine as told by your doctor. ?Keep your wound covered for 24 hours or as told by your doctor. ?Ask your doctor when you can bathe or shower. ?Put ice on your wound if it helps your pain. ?Put ice in a plastic bag. ?Place a towel between your skin and the bag. ?Leave the ice on for 15 to 20 minutes, 3 to 4 times a day. ? ?Return to normal activities as told by your doctor. ?Ask your doctor what stretches and exercises you can do. ?Do not bend or lift anything heavy as told by your doctor. ?GET HELP RIGHT AWAY IF:  ?You have bad back pain that comes on suddenly. ?You cannot control when you pee (urinate) or poop (bowel movement). ?You lose feeling (numbness) in your legs or feet, or they become weak. ?You have shooting pain down your legs. ?You have a fever. ?Your wound becomes red, puffy (swollen), or tender to the touch. ?You are bleeding or leaking fluid from the wound. ?You are sick to your stomach (nauseous) or throw up (vomit) for more than 24 hours after the procedure. ?Your back pain does not get better. ?MAKE SURE YOU: ?Understand these instructions. ?Will watch your condition. ?Will get help right away if you are not doing well or get worse. ?Document Released: 12/07/2009 Document Revised: 12/05/2011 Document Reviewed: 02/16/2011 ?ExitCare? Patient Information ?2013 [employer].  ?

## 2020-01-31 DIAGNOSIS — I350 Nonrheumatic aortic (valve) stenosis: Secondary | ICD-10-CM | POA: Diagnosis not present

## 2020-01-31 DIAGNOSIS — I739 Peripheral vascular disease, unspecified: Secondary | ICD-10-CM | POA: Diagnosis not present

## 2020-01-31 DIAGNOSIS — I69351 Hemiplegia and hemiparesis following cerebral infarction affecting right dominant side: Secondary | ICD-10-CM | POA: Diagnosis not present

## 2020-01-31 DIAGNOSIS — D631 Anemia in chronic kidney disease: Secondary | ICD-10-CM | POA: Diagnosis not present

## 2020-01-31 DIAGNOSIS — I5032 Chronic diastolic (congestive) heart failure: Secondary | ICD-10-CM | POA: Diagnosis not present

## 2020-01-31 DIAGNOSIS — R8281 Pyuria: Secondary | ICD-10-CM | POA: Diagnosis not present

## 2020-01-31 DIAGNOSIS — N1831 Chronic kidney disease, stage 3a: Secondary | ICD-10-CM | POA: Diagnosis not present

## 2020-01-31 DIAGNOSIS — I130 Hypertensive heart and chronic kidney disease with heart failure and stage 1 through stage 4 chronic kidney disease, or unspecified chronic kidney disease: Secondary | ICD-10-CM | POA: Diagnosis not present

## 2020-01-31 DIAGNOSIS — J449 Chronic obstructive pulmonary disease, unspecified: Secondary | ICD-10-CM | POA: Diagnosis not present

## 2020-02-01 ENCOUNTER — Other Ambulatory Visit: Payer: Self-pay | Admitting: Family Medicine

## 2020-02-04 DIAGNOSIS — S22070A Wedge compression fracture of T9-T10 vertebra, initial encounter for closed fracture: Secondary | ICD-10-CM | POA: Diagnosis not present

## 2020-02-04 DIAGNOSIS — I10 Essential (primary) hypertension: Secondary | ICD-10-CM | POA: Diagnosis not present

## 2020-02-05 ENCOUNTER — Telehealth: Payer: Self-pay | Admitting: Family Medicine

## 2020-02-05 ENCOUNTER — Telehealth: Payer: Self-pay

## 2020-02-05 ENCOUNTER — Ambulatory Visit: Payer: Medicare HMO

## 2020-02-05 ENCOUNTER — Other Ambulatory Visit: Payer: Self-pay

## 2020-02-05 DIAGNOSIS — I10 Essential (primary) hypertension: Secondary | ICD-10-CM

## 2020-02-05 DIAGNOSIS — E782 Mixed hyperlipidemia: Secondary | ICD-10-CM

## 2020-02-05 DIAGNOSIS — I5032 Chronic diastolic (congestive) heart failure: Secondary | ICD-10-CM

## 2020-02-05 NOTE — Telephone Encounter (Signed)
Called [name] [name] from [facility] back and he is calling their Care Manager to see why they havent gone out for Level of Care Assessment. Currently HH Pt is still going out to the home. He will look into and call me back .

## 2020-02-05 NOTE — Telephone Encounter (Signed)
Patient would like to know if care manager will be coming to assess for home health or additional in-home care? This was mentioned at last PCP visit.  Phil Dopp, PharmD Clinical Pharmacist [facility] [phone]

## 2020-02-05 NOTE — Telephone Encounter (Signed)
Spoke with [name] [name] Clinical manager RN. [name] [name] said she will be having  Child psychotherapist go out to the home. Patient will have OT for about two more weeks. Neither patient or daughter want her to go to a SNF. Patient is able to ambulate safely in the home and fairly independent. SW will try to help with possible personal care services as this is what the patient and daughter are really looking for. Her insurance may provide this and the SW will help look into this.

## 2020-02-05 NOTE — Telephone Encounter (Signed)
Noted. Thanks.

## 2020-02-05 NOTE — Chronic Care Management (AMB) (Signed)
Chronic Care Management Pharmacy  Name: [name] [name]  MRN: [id number] DOB: 21-Dec-1945  Chief Complaint/ HPI  [name] [name],  74 y.o., female presents for their Follow-Up CCM visit with the clinical pharmacist via telephone. Spoke with patient and her daughter, [name] [name].  PCP : [name] Boyden, MD  Their chronic conditions addressed today include: hypertension, peripheral vascular disease, heart failure, CKD, hyperlipidemia, major depressive disorder, insomnia  ---> Changes since last CCM visit: benazepril discontinued at hospital due to fatigue  Caregiver concerns: would like assistance in home with ADLs, will check to see if care manager is scheduled to visit  Office Visits/Consults since last CCM visit on 12/17/19:  01/29/20: Elective surgery - fracture  01/07/20: [name] [name] -  Recent hosp for elevated troponin, cardiac eval normal, benazepril stopped, back fracture - robaxin BID and hydrocodone for breakthrough despite hydrocodone, tylenol PRN, hospital PT recommended [facility], continues chronic TMP for UTI prophylaxis, home PT twice weekly   01/03/20: Cardiology - cont current meds  12/22/19: ED for back pain  12/19/19: Patient fell  Current Diagnosis/Assessment: Goals    . health management     Starting 06/27/2018, I will continue to take medications as prescribed.     . Pharmacy Care Plan     CARE PLAN ENTRY  Current Barriers:  . Chronic Disease Management support, education, and care coordination needs related to Hypertension, Hyperlipidemia, and Heart Failure   Hypertension . Pharmacist Clinical Goal(s): o Over the next 3 month, patient will work with PharmD and providers to maintain BP goal <140/90 . Current regimen:  o No pharmacotherapy . Interventions: o Recommend continued monitoring of blood pressure once weekly to ensure blood pressure remains within goal  . Patient self care activities - Over the next 3 months, patient will: o Check blood pressure weekly,  document, and provide at future appointments  Hyperlipidemia . Pharmacist Clinical Goal(s): o Over the next 3 months, patient will work with PharmD and providers to achieve LDL goal < 70  . Current regimen:  o Atorvastatin 40 mg - 1 tablet daily . Interventions: o Continue to hold Zetia until patient is feeling better, without body aches/muscle weakness . Patient self care activities - Over the next 3 months, patient will: o Continue current medication as prescribed o Watch cholesterol content in foods  Heart Failure . Pharmacist Clinical Goal(s) o Over the next 3 months, patient will work with PharmD and providers to prevent shortness of breath and fluid overload . Current regimen:   Furosemide 40 mg - 1/2 tablet daily as needed for weight gain  Potassium Chloride ER 20 mEq - 1 tablet daily as needed with furosemide . Interventions: o Continue medications as needed for weight gain/swelling . Patient self care activities - Over the next 6 months, patient will: o Continue to check weight daily and take furosemide and potassium if weight gain of 3 pounds overnight or 5 pounds in 1 week  Please see past updates related to this goal by clicking on the "Past Updates" button in the selected goal       Heart Failure   CMP Latest Ref Rng & Units 01/27/2020 01/07/2020 12/26/2019  Glucose 70 - 99 mg/dL 638(L) 93 96  BUN 8 - 23 mg/dL 15 16 13   Creatinine 0.44 - 1.00 mg/dL ) 3.73(S 2.87)  Sodium 135 - 145 mmol/L 140 135 140  Potassium 3.5 - 5.1 mmol/L 4.6 4.6 4.3  Chloride 98 - 111 mmol/L 106 100 101  CO2 22 - 32  mmol/L 27 28 27   Calcium 8.9 - 10.3 mg/dL 9.1 9.6 8.9  Total Protein 6.5 - 8.1 g/dL - - -  Total Bilirubin 0.3 - 1.2 mg/dL - - -  Alkaline Phos 38 - 126 U/L - - -  AST 15 - 41 U/L - - -  ALT 0 - 44 U/L - - -   Type: Diastolic Recent exacerbations: 11/05/19 (no hospitalization)  Symptoms: took one Lasix/potassium this week due to swelling, SOB controlled as pt is limiting  activity with recent surgery and fall  Last ejection fraction: 07/31/18 60-65% NYHA Class: III (marked limitation of activity) AHA HF Stage: C (Heart disease and symptoms present)  Patient has failed these meds in past: none  Patient is currently controlled on the following medications:   Furosemide 40 mg - 1/2 tablet daily PRN  Potassium Chloride ER 20 mEq - 1 tablet daily PRN with Lasix   We discussed: pt continues to weigh daily and take Lasix/potassium PRN for weight gain 3 pounds overnight or 5 pounds in 1 week  Plan: Continue current medications  Hypertension   Office blood pressures are: BP Readings from Last 3 Encounters:  01/30/20 110/69  01/27/20 112/65  01/07/20 122/86   BP goal < 140/90 mmHg Patient has tried these meds in the past: benazepril - stopped 12/2019 due to fatigue Patient checks BP at home weekly Patient home BP readings are ranging: doing well - PT has checked BP twice a week   Patient is currently controlled on the following medications:   No pharamacotherapy  Plan: Continue current medications   Hyperlipidemia/CAD   CBC Latest Ref Rng & Units 01/27/2020 01/07/2020 12/26/2019  WBC 4.0 - 10.5 K/uL 7.4 8.5 5.6  Hemoglobin 12.0 - 15.0 g/dL 12.9 12.6 11.3(L)  Hematocrit 36.0 - 46.0 % 42.1 38.5 35.9(L)  Platelets 150 - 400 K/uL 197 280.0 166   Lipid Panel     Component Value Date/Time   CHOL 237 (H) 07/01/2019 0954   TRIG 75.0 07/01/2019 0954   HDL 65.50 07/01/2019 0954   CHOLHDL 4 07/01/2019 0954   VLDL 15.0 07/01/2019 0954   LDLCALC 157 (H) 07/01/2019 0954   LDLDIRECT 84.0 09/11/2017 1107    LDL goal < 70 History of CVA (3) with right sided weakness; stent placed 12/10/18 Patient has failed these meds in past: none reported Patient is currently uncontrolled on the following medications:   Atorvastatin 40 mg - 1 tablet daily (AM)  Aspirin 81 mg - 1 tablet daily (AM)  Plavix 75 mg - 1 tablet daily (AM)  We discussed: [name] Hamper,  MD for aneurism/stenting and Dr. Rockey [name], cardiology; Per PCP on 12/19/19, hold off on starting Zetia until body aches improved; As patient had recent fall/surgery, will continue to hold.   Plan:  Will consider starting Zetia at 3 month follow up.    MDD/Insomnia    Patient has failed these meds in past: none Patient is currently controlled on the following medications:   Sertraline 100 mg - 1 tablet daily  Trazodone 100 mg - 1 and 1/2 tablet daily   We discussed: reports doing well with sleep/mood lately   Plan: Continue current medications   COPD / Tobacco   Last spirometry score: 12/2012 Post-FEV1 69% Gold Grade: Gold 2 (FEV1 50-79%) Current COPD Classification:  Complicated by CHF, unable to assess  Eosinophil count:   Lab Results  Component Value Date/Time   EOSPCT 2.4 01/07/2020 01:22 PM  %  Eos (Absolute):  Lab Results  Component Value Date/Time   EOSABS 0.2 01/07/2020 01:22 PM   Tobacco Status: Denies current use  Social History   Tobacco Use  Smoking Status Former Smoker  . Packs/day: 2.00  . Years: 46.00  . Pack years: 92.00  . Types: Cigarettes  . Quit date: 11/08/2012  . Years since quitting: 7.2  Smokeless Tobacco Never Used   Patient has failed these meds in past: none  Patient is currently controlled on the following medications:   Albuterol 90 mcg - 2 puffs every 6 hours as needed  Advair 115-21 mcg - Inhale 2 puffs twice daily and rinse  Supplemental Oxygen - mostly at night  Using maintenance inhaler regularly? Yes Frequency of rescue inhaler use: not frequently   We discussed: pt is no longer needing albuterol daily and using less daytime oxygen since she began using Lasix as directed, continues Advair BID as prescribed without issues  Plan: Continue current medications  Acute Pain   Reports significant pain since surgery/fall  Patient has failed these meds in past: none reported Patient is currently  controlled on the following medications:  Gabapentin 100 mg - 1 capsule at bedtime   Oxycodone 5 mg - 1 tablet every 6 hours as needed  Valium 5 mg - 1 tablet PO TID PRN  OTC: Tylenol 325 mg, 2 tablets as needed  We discussed: pt usually on tramadol and hydrocodone PRN chronic pain from PCP, due to worsened pain, surgeon has prescribed short term oxycodone and Valium; daughter is only giving Valium before bed, oxycodone every 6 hours as needed, aware of fall risk   Plan: Continue current medications   Vitamin D Deficiency/Osteoporosis    Vitamin D (07/01/19): 38; Calcium WNL Patient has failed these meds in past: none Patient is currently controlled on the following medications:   Vitamin D3 5000 IU - 1 capsule daily   Fosamax - 1 tablet weekly before meals with full glass of water  Calcium/Vitamin D - 1 tablet daily (not taking)  We discussed: Patient was instructed to increase dietary calcium intake and discontinue calcium supplement at previous AWV. Pt restarted Fosamax after recent fall and taking as directed.  Plan: Continue current medications  Recurrent UTI   Followed by Urology Patient has failed these meds in past: none Patient is currently controlled on the following medications:   Trimethoprim 100 mg - 1 tablet daily  Cranberry supplements BID  We discussed: denies adverse effects, denies UTI since starting trimethoprim  Plan: Continue current medications   Medication Management Misc/No diagnosis: Pantoprazole 40 mg - 1 tablet daily as needed  OTCs: Multivitamin, Zinc, vitamin C, Azo (cranberry)  Pharmacy: [facility] (delivered), all medications free except OTCs  Adherence: pillbox  Social support: lives with oldest daughter  Affordability: no concerns  Vaccines: due Shingrix    CCM Follow Up: 3 months, telephone   Phil Dopp, PharmD Clinical Pharmacist [facility] [phone]

## 2020-02-05 NOTE — Patient Instructions (Addendum)
Feb 05, 2020  Dear [name] [name],  Below is a summary of the goals we discussed during our telephone visit on 02/05/20. Please contact me anytime with questions or concerns.   Visit Information  Goals Addressed            This Visit's Progress   . Pharmacy Care Plan       CARE PLAN ENTRY  Current Barriers:  . Chronic Disease Management support, education, and care coordination needs related to Hypertension, Hyperlipidemia, and Heart Failure   Hypertension . Pharmacist Clinical Goal(s): o Over the next 3 month, patient will work with PharmD and providers to maintain BP goal <140/90 . Current regimen:  o No pharmacotherapy . Interventions: o Recommend continued monitoring of blood pressure once weekly to ensure blood pressure remains within goal  . Patient self care activities - Over the next 3 months, patient will: o Check blood pressure weekly, document, and provide at future appointments  Hyperlipidemia . Pharmacist Clinical Goal(s): o Over the next 3 months, patient will work with PharmD and providers to achieve LDL goal < 70  . Current regimen:  o Atorvastatin 40 mg - 1 tablet daily . Interventions: o Continue to hold Zetia until patient is feeling better, without body aches/muscle weakness . Patient self care activities - Over the next 3 months, patient will: o Continue current medication as prescribed o Watch cholesterol content in foods  Heart Failure . Pharmacist Clinical Goal(s) o Over the next 3 months, patient will work with PharmD and providers to prevent shortness of breath and fluid overload . Current regimen:   Furosemide 40 mg - 1/2 tablet daily as needed for weight gain  Potassium Chloride ER 20 mEq - 1 tablet daily as needed with furosemide . Interventions: o Continue medications as needed for weight gain/swelling . Patient self care activities - Over the next 6 months, patient will: o Continue to check weight daily and take furosemide and  potassium if weight gain of 3 pounds overnight or 5 pounds in 1 week  Please see past updates related to this goal by clicking on the "Past Updates" button in the selected goal       Patient verbalizes understanding of instructions provided today.  Telephone follow up appointment with pharmacy team member scheduled for:  05/07/20 at 9:00 AM (telephone)  Phil Dopp, PharmD Clinical Pharmacist  Primary Care at [facility] [phone]

## 2020-02-05 NOTE — Progress Notes (Signed)
I have collaborated with the care management provider regarding care management and care coordination activities outlined in this encounter and have reviewed this encounter including documentation in the note and care plan. I am certifying that I agree with the content of this note and encounter as supervising physician.  

## 2020-02-05 NOTE — Telephone Encounter (Signed)
See phone note from 01/07/2020. Per [name]:  Called [name] Acct manager for [facility] [phone]. Requested Care Manager go out to home for evaluation for level of care needs and possible SNF placement. [name] will pass this along to their Clinical team .  I guess Care manager didn't come out to the house - [name] can we touch base with [facility] again about this?

## 2020-02-10 DIAGNOSIS — R8281 Pyuria: Secondary | ICD-10-CM | POA: Diagnosis not present

## 2020-02-10 DIAGNOSIS — I5032 Chronic diastolic (congestive) heart failure: Secondary | ICD-10-CM | POA: Diagnosis not present

## 2020-02-10 DIAGNOSIS — I739 Peripheral vascular disease, unspecified: Secondary | ICD-10-CM | POA: Diagnosis not present

## 2020-02-10 DIAGNOSIS — I130 Hypertensive heart and chronic kidney disease with heart failure and stage 1 through stage 4 chronic kidney disease, or unspecified chronic kidney disease: Secondary | ICD-10-CM | POA: Diagnosis not present

## 2020-02-10 DIAGNOSIS — I69351 Hemiplegia and hemiparesis following cerebral infarction affecting right dominant side: Secondary | ICD-10-CM | POA: Diagnosis not present

## 2020-02-10 DIAGNOSIS — I350 Nonrheumatic aortic (valve) stenosis: Secondary | ICD-10-CM | POA: Diagnosis not present

## 2020-02-10 DIAGNOSIS — N1831 Chronic kidney disease, stage 3a: Secondary | ICD-10-CM | POA: Diagnosis not present

## 2020-02-10 DIAGNOSIS — D631 Anemia in chronic kidney disease: Secondary | ICD-10-CM | POA: Diagnosis not present

## 2020-02-10 DIAGNOSIS — J449 Chronic obstructive pulmonary disease, unspecified: Secondary | ICD-10-CM | POA: Diagnosis not present

## 2020-02-11 ENCOUNTER — Telehealth: Payer: Self-pay

## 2020-02-11 DIAGNOSIS — I739 Peripheral vascular disease, unspecified: Secondary | ICD-10-CM | POA: Diagnosis not present

## 2020-02-11 DIAGNOSIS — I5032 Chronic diastolic (congestive) heart failure: Secondary | ICD-10-CM | POA: Diagnosis not present

## 2020-02-11 DIAGNOSIS — I69351 Hemiplegia and hemiparesis following cerebral infarction affecting right dominant side: Secondary | ICD-10-CM | POA: Diagnosis not present

## 2020-02-11 DIAGNOSIS — D631 Anemia in chronic kidney disease: Secondary | ICD-10-CM | POA: Diagnosis not present

## 2020-02-11 DIAGNOSIS — N1831 Chronic kidney disease, stage 3a: Secondary | ICD-10-CM | POA: Diagnosis not present

## 2020-02-11 DIAGNOSIS — I130 Hypertensive heart and chronic kidney disease with heart failure and stage 1 through stage 4 chronic kidney disease, or unspecified chronic kidney disease: Secondary | ICD-10-CM | POA: Diagnosis not present

## 2020-02-11 DIAGNOSIS — I350 Nonrheumatic aortic (valve) stenosis: Secondary | ICD-10-CM | POA: Diagnosis not present

## 2020-02-11 DIAGNOSIS — J449 Chronic obstructive pulmonary disease, unspecified: Secondary | ICD-10-CM | POA: Diagnosis not present

## 2020-02-11 DIAGNOSIS — R8281 Pyuria: Secondary | ICD-10-CM | POA: Diagnosis not present

## 2020-02-11 NOTE — Telephone Encounter (Signed)
[facility] aide with [facility] left v/m that 02/11/20 at 7:30 AM pt fell in kitchen with no noted or apparent injuries. FYI to Dr Reece Agar.

## 2020-02-12 ENCOUNTER — Telehealth: Payer: Self-pay | Admitting: *Deleted

## 2020-02-12 ENCOUNTER — Ambulatory Visit: Payer: Self-pay | Admitting: Urology

## 2020-02-12 DIAGNOSIS — I739 Peripheral vascular disease, unspecified: Secondary | ICD-10-CM | POA: Diagnosis not present

## 2020-02-12 DIAGNOSIS — I130 Hypertensive heart and chronic kidney disease with heart failure and stage 1 through stage 4 chronic kidney disease, or unspecified chronic kidney disease: Secondary | ICD-10-CM | POA: Diagnosis not present

## 2020-02-12 DIAGNOSIS — I69351 Hemiplegia and hemiparesis following cerebral infarction affecting right dominant side: Secondary | ICD-10-CM | POA: Diagnosis not present

## 2020-02-12 DIAGNOSIS — N1831 Chronic kidney disease, stage 3a: Secondary | ICD-10-CM | POA: Diagnosis not present

## 2020-02-12 DIAGNOSIS — D631 Anemia in chronic kidney disease: Secondary | ICD-10-CM | POA: Diagnosis not present

## 2020-02-12 DIAGNOSIS — R8281 Pyuria: Secondary | ICD-10-CM | POA: Diagnosis not present

## 2020-02-12 DIAGNOSIS — I350 Nonrheumatic aortic (valve) stenosis: Secondary | ICD-10-CM | POA: Diagnosis not present

## 2020-02-12 DIAGNOSIS — J449 Chronic obstructive pulmonary disease, unspecified: Secondary | ICD-10-CM | POA: Diagnosis not present

## 2020-02-12 DIAGNOSIS — I5032 Chronic diastolic (congestive) heart failure: Secondary | ICD-10-CM | POA: Diagnosis not present

## 2020-02-12 NOTE — Telephone Encounter (Signed)
Sheria Lang, occupational  therapist with [facility] called today She stated that the patient fell while getting onto her scale today.  Stated that patient's legs gave way  Sheria Lang said that the daughter and granddaughter were able to lower the patient to the ground. There was no injury    FYI to DR G.   [phone]

## 2020-02-12 NOTE — Telephone Encounter (Signed)
[name] [name] PT with [facility] called requesting orders for physical therapy for twice a week for two weeks and once a week for one week. [name] [name] stated that patient's mobility has declined. [name] [name] stated after these visits patient will be re-evaluated.

## 2020-02-13 ENCOUNTER — Telehealth: Payer: Self-pay

## 2020-02-13 NOTE — Telephone Encounter (Signed)
Sheria Lang requesting verbal orders for [facility] skilled nursing eval; also do you want pt to increase Oxygen while up moving around. Sheria Lang said pt has pain level of 9 with back pain ( on 05/05/211 pt had thoracic ten Kyphoplasty; pt has general weakness and when up moving around (pt holds breath while moving around) pulse ox is 70%. Pt recovers to 96-97 % for pulse ox quickly and pt is presently on 2L of oxygen. Sheria Lang thinks pulse ox drops because pt is holding breath and her back pain is worse when up moving around. I advised usually at 70 % pulse ox would recommend ED eval but Sheria Lang said pt recovers quickly to mid 90's.Dr Reece Agar out of office. Will send as FYI to Dr Reece Agar and note also sent to Dr Selena Batten who is in office.

## 2020-02-13 NOTE — Telephone Encounter (Signed)
If patient has an increased oxygen need she needs to be evaluated prior to changing home health orders

## 2020-02-13 NOTE — Telephone Encounter (Addendum)
I was unable to reach [facility] OT with [facility] and I called [facility] at [phone] option 2 and spoke with [facility] nurse (got v/m) I called back and spoke with operator, Rinaldo Cloud and office is closed due covid but will transfer me to Burman Riis who is clinical mgr working from home.  I  Was unable to speak with Lanora Manis and  Jamal Maes to call back Sheria Lang and spoke with Sheria Lang who is with another pt and advised Sheria Lang of Dr Selena Batten instructions which Sheria Lang voiced understanding but did advise since she was with another pt to call [name] pts daughter (DPR signed) I called [name] and advised of information Sheria Lang OT with [facility] had given and Dr Elmyra Ricks instruction and [name] said that pt is not in distress and has low  Pulse all the time due to cold hands and has muscle spasms in back since covid vaccine injection, SOB is due to COPD and CHF and pts weakness is due to her age and back pain since  Back surgery 01/29/20. [name] needs appt at [facility] on 02/14/20 around 12 noon. I gave Dr Milinda Antis this info and she advised for best pt care would recommend being seen today at [facility] or ED but if family will not do that then can schedule appt with Dr Milinda Antis in office on 02/14/20 at 12 noon. [name] given UC & ED precautions and she voiced understanding and is appreciative for appt on 02/14/20. FYI to Dr Milinda Antis and Dr Sharen Hones as PCP.

## 2020-02-14 ENCOUNTER — Inpatient Hospital Stay
Admission: EM | Admit: 2020-02-14 | Discharge: 2020-02-20 | DRG: 189 | Disposition: A | Discharge status: Skilled Nursing Facility | Payer: Medicare HMO | Attending: Hospitalist | Admitting: Hospitalist

## 2020-02-14 ENCOUNTER — Ambulatory Visit (INDEPENDENT_AMBULATORY_CARE_PROVIDER_SITE_OTHER): Payer: Medicare HMO | Admitting: Family Medicine

## 2020-02-14 ENCOUNTER — Emergency Department: Payer: Medicare HMO

## 2020-02-14 ENCOUNTER — Encounter: Payer: Self-pay | Admitting: Family Medicine

## 2020-02-14 ENCOUNTER — Other Ambulatory Visit: Payer: Self-pay

## 2020-02-14 ENCOUNTER — Encounter: Payer: Self-pay | Admitting: Emergency Medicine

## 2020-02-14 VITALS — BP 102/58 | HR 110 | Temp 96.6°F | Ht 59.0 in

## 2020-02-14 DIAGNOSIS — I69051 Hemiplegia and hemiparesis following nontraumatic subarachnoid hemorrhage affecting right dominant side: Secondary | ICD-10-CM | POA: Diagnosis not present

## 2020-02-14 DIAGNOSIS — E861 Hypovolemia: Secondary | ICD-10-CM

## 2020-02-14 DIAGNOSIS — I5032 Chronic diastolic (congestive) heart failure: Secondary | ICD-10-CM | POA: Diagnosis not present

## 2020-02-14 DIAGNOSIS — R498 Other voice and resonance disorders: Secondary | ICD-10-CM | POA: Diagnosis not present

## 2020-02-14 DIAGNOSIS — R000 Tachycardia, unspecified: Secondary | ICD-10-CM | POA: Diagnosis not present

## 2020-02-14 DIAGNOSIS — T4275XA Adverse effect of unspecified antiepileptic and sedative-hypnotic drugs, initial encounter: Secondary | ICD-10-CM | POA: Diagnosis not present

## 2020-02-14 DIAGNOSIS — Z888 Allergy status to other drugs, medicaments and biological substances status: Secondary | ICD-10-CM

## 2020-02-14 DIAGNOSIS — E039 Hypothyroidism, unspecified: Secondary | ICD-10-CM | POA: Diagnosis present

## 2020-02-14 DIAGNOSIS — Z881 Allergy status to other antibiotic agents status: Secondary | ICD-10-CM

## 2020-02-14 DIAGNOSIS — R001 Bradycardia, unspecified: Secondary | ICD-10-CM | POA: Diagnosis not present

## 2020-02-14 DIAGNOSIS — I739 Peripheral vascular disease, unspecified: Secondary | ICD-10-CM | POA: Diagnosis present

## 2020-02-14 DIAGNOSIS — X58XXXD Exposure to other specified factors, subsequent encounter: Secondary | ICD-10-CM | POA: Diagnosis present

## 2020-02-14 DIAGNOSIS — Z833 Family history of diabetes mellitus: Secondary | ICD-10-CM

## 2020-02-14 DIAGNOSIS — Z79890 Hormone replacement therapy (postmenopausal): Secondary | ICD-10-CM

## 2020-02-14 DIAGNOSIS — I700 Atherosclerosis of aorta: Secondary | ICD-10-CM | POA: Diagnosis present

## 2020-02-14 DIAGNOSIS — M62838 Other muscle spasm: Secondary | ICD-10-CM | POA: Diagnosis not present

## 2020-02-14 DIAGNOSIS — Z79899 Other long term (current) drug therapy: Secondary | ICD-10-CM | POA: Diagnosis not present

## 2020-02-14 DIAGNOSIS — I9589 Other hypotension: Secondary | ICD-10-CM

## 2020-02-14 DIAGNOSIS — R531 Weakness: Secondary | ICD-10-CM

## 2020-02-14 DIAGNOSIS — G4700 Insomnia, unspecified: Secondary | ICD-10-CM | POA: Diagnosis present

## 2020-02-14 DIAGNOSIS — I350 Nonrheumatic aortic (valve) stenosis: Secondary | ICD-10-CM | POA: Diagnosis present

## 2020-02-14 DIAGNOSIS — I959 Hypotension, unspecified: Secondary | ICD-10-CM | POA: Diagnosis not present

## 2020-02-14 DIAGNOSIS — J441 Chronic obstructive pulmonary disease with (acute) exacerbation: Secondary | ICD-10-CM | POA: Diagnosis not present

## 2020-02-14 DIAGNOSIS — Z20822 Contact with and (suspected) exposure to covid-19: Secondary | ICD-10-CM | POA: Diagnosis not present

## 2020-02-14 DIAGNOSIS — R262 Difficulty in walking, not elsewhere classified: Secondary | ICD-10-CM | POA: Diagnosis not present

## 2020-02-14 DIAGNOSIS — R269 Unspecified abnormalities of gait and mobility: Secondary | ICD-10-CM | POA: Diagnosis not present

## 2020-02-14 DIAGNOSIS — Z87440 Personal history of urinary (tract) infections: Secondary | ICD-10-CM | POA: Diagnosis present

## 2020-02-14 DIAGNOSIS — S22079S Unspecified fracture of T9-T10 vertebra, sequela: Secondary | ICD-10-CM

## 2020-02-14 DIAGNOSIS — J449 Chronic obstructive pulmonary disease, unspecified: Secondary | ICD-10-CM | POA: Diagnosis not present

## 2020-02-14 DIAGNOSIS — Z8249 Family history of ischemic heart disease and other diseases of the circulatory system: Secondary | ICD-10-CM

## 2020-02-14 DIAGNOSIS — J9611 Chronic respiratory failure with hypoxia: Secondary | ICD-10-CM | POA: Diagnosis not present

## 2020-02-14 DIAGNOSIS — Z9981 Dependence on supplemental oxygen: Secondary | ICD-10-CM

## 2020-02-14 DIAGNOSIS — E669 Obesity, unspecified: Secondary | ICD-10-CM | POA: Diagnosis present

## 2020-02-14 DIAGNOSIS — F329 Major depressive disorder, single episode, unspecified: Secondary | ICD-10-CM | POA: Diagnosis present

## 2020-02-14 DIAGNOSIS — Y92009 Unspecified place in unspecified non-institutional (private) residence as the place of occurrence of the external cause: Secondary | ICD-10-CM

## 2020-02-14 DIAGNOSIS — M6281 Muscle weakness (generalized): Secondary | ICD-10-CM | POA: Diagnosis not present

## 2020-02-14 DIAGNOSIS — R488 Other symbolic dysfunctions: Secondary | ICD-10-CM | POA: Diagnosis not present

## 2020-02-14 DIAGNOSIS — Z7983 Long term (current) use of bisphosphonates: Secondary | ICD-10-CM

## 2020-02-14 DIAGNOSIS — I10 Essential (primary) hypertension: Secondary | ICD-10-CM | POA: Diagnosis present

## 2020-02-14 DIAGNOSIS — I6521 Occlusion and stenosis of right carotid artery: Secondary | ICD-10-CM | POA: Diagnosis present

## 2020-02-14 DIAGNOSIS — E785 Hyperlipidemia, unspecified: Secondary | ICD-10-CM | POA: Diagnosis not present

## 2020-02-14 DIAGNOSIS — Z7982 Long term (current) use of aspirin: Secondary | ICD-10-CM

## 2020-02-14 DIAGNOSIS — J432 Centrilobular emphysema: Secondary | ICD-10-CM | POA: Diagnosis not present

## 2020-02-14 DIAGNOSIS — J9621 Acute and chronic respiratory failure with hypoxia: Secondary | ICD-10-CM | POA: Diagnosis not present

## 2020-02-14 DIAGNOSIS — S22000A Wedge compression fracture of unspecified thoracic vertebra, initial encounter for closed fracture: Secondary | ICD-10-CM | POA: Diagnosis present

## 2020-02-14 DIAGNOSIS — M810 Age-related osteoporosis without current pathological fracture: Secondary | ICD-10-CM | POA: Diagnosis present

## 2020-02-14 DIAGNOSIS — G8929 Other chronic pain: Secondary | ICD-10-CM | POA: Diagnosis present

## 2020-02-14 DIAGNOSIS — R0902 Hypoxemia: Secondary | ICD-10-CM | POA: Diagnosis not present

## 2020-02-14 DIAGNOSIS — Z7902 Long term (current) use of antithrombotics/antiplatelets: Secondary | ICD-10-CM

## 2020-02-14 DIAGNOSIS — K219 Gastro-esophageal reflux disease without esophagitis: Secondary | ICD-10-CM | POA: Diagnosis present

## 2020-02-14 DIAGNOSIS — Z9841 Cataract extraction status, right eye: Secondary | ICD-10-CM

## 2020-02-14 DIAGNOSIS — M4854XD Collapsed vertebra, not elsewhere classified, thoracic region, subsequent encounter for fracture with routine healing: Secondary | ICD-10-CM | POA: Diagnosis not present

## 2020-02-14 DIAGNOSIS — Z9842 Cataract extraction status, left eye: Secondary | ICD-10-CM

## 2020-02-14 DIAGNOSIS — Z87891 Personal history of nicotine dependence: Secondary | ICD-10-CM

## 2020-02-14 DIAGNOSIS — Z6836 Body mass index (BMI) 36.0-36.9, adult: Secondary | ICD-10-CM | POA: Diagnosis not present

## 2020-02-14 DIAGNOSIS — M5489 Other dorsalgia: Secondary | ICD-10-CM | POA: Diagnosis not present

## 2020-02-14 DIAGNOSIS — F419 Anxiety disorder, unspecified: Secondary | ICD-10-CM | POA: Diagnosis present

## 2020-02-14 DIAGNOSIS — M549 Dorsalgia, unspecified: Secondary | ICD-10-CM | POA: Diagnosis present

## 2020-02-14 DIAGNOSIS — R0602 Shortness of breath: Secondary | ICD-10-CM | POA: Diagnosis not present

## 2020-02-14 DIAGNOSIS — Z9049 Acquired absence of other specified parts of digestive tract: Secondary | ICD-10-CM

## 2020-02-14 DIAGNOSIS — Z9889 Other specified postprocedural states: Secondary | ICD-10-CM

## 2020-02-14 DIAGNOSIS — M8000XA Age-related osteoporosis with current pathological fracture, unspecified site, initial encounter for fracture: Secondary | ICD-10-CM | POA: Diagnosis not present

## 2020-02-14 DIAGNOSIS — J9601 Acute respiratory failure with hypoxia: Secondary | ICD-10-CM | POA: Diagnosis not present

## 2020-02-14 DIAGNOSIS — Z8049 Family history of malignant neoplasm of other genital organs: Secondary | ICD-10-CM

## 2020-02-14 LAB — CBC
HCT: 38.4 % (ref 36.0–46.0)
Hemoglobin: 12.2 g/dL (ref 12.0–15.0)
MCH: 29.8 pg (ref 26.0–34.0)
MCHC: 31.8 g/dL (ref 30.0–36.0)
MCV: 93.7 fL (ref 80.0–100.0)
Platelets: 208 10*3/uL (ref 150–400)
RBC: 4.1 MIL/uL (ref 3.87–5.11)
RDW: 13.2 % (ref 11.5–15.5)
WBC: 10.5 10*3/uL (ref 4.0–10.5)
nRBC: 0 % (ref 0.0–0.2)

## 2020-02-14 LAB — URINALYSIS, COMPLETE (UACMP) WITH MICROSCOPIC
Bacteria, UA: NONE SEEN
Bilirubin Urine: NEGATIVE
Glucose, UA: NEGATIVE mg/dL
Hgb urine dipstick: NEGATIVE
Ketones, ur: NEGATIVE mg/dL
Nitrite: NEGATIVE
Protein, ur: NEGATIVE mg/dL
Specific Gravity, Urine: 1.029 (ref 1.005–1.030)
pH: 5 (ref 5.0–8.0)

## 2020-02-14 LAB — TROPONIN I (HIGH SENSITIVITY)
Troponin I (High Sensitivity): 7 ng/L (ref <18)
Troponin I (High Sensitivity): 7 ng/L (ref <18)

## 2020-02-14 LAB — BASIC METABOLIC PANEL
Anion gap: 8 (ref 5–15)
BUN: 18 mg/dL (ref 8–23)
CO2: 27 mmol/L (ref 22–32)
Calcium: 8.7 mg/dL — ABNORMAL LOW (ref 8.9–10.3)
Chloride: 104 mmol/L (ref 98–111)
Creatinine, Ser: 1 mg/dL (ref 0.44–1.00)
GFR calc Af Amer: >60 mL/min (ref >60)
GFR calc non Af Amer: 56 mL/min — ABNORMAL LOW (ref >60)
Glucose, Bld: 98 mg/dL (ref 70–99)
Potassium: 4.6 mmol/L (ref 3.5–5.1)
Sodium: 139 mmol/L (ref 135–145)

## 2020-02-14 LAB — SARS CORONAVIRUS 2 BY RT PCR (HOSPITAL ORDER, PERFORMED IN ~~LOC~~ HOSPITAL LAB): SARS Coronavirus 2: NEGATIVE

## 2020-02-14 LAB — BRAIN NATRIURETIC PEPTIDE: B Natriuretic Peptide: 71.2 pg/mL (ref 0.0–100.0)

## 2020-02-14 MED ORDER — ACETAMINOPHEN 650 MG RE SUPP: 650.0000 mg | Freq: Four times a day (QID) | RECTAL | Status: DC | PRN

## 2020-02-14 MED ORDER — IPRATROPIUM-ALBUTEROL 0.5-2.5 (3) MG/3ML IN SOLN
3.0000 mL | Freq: Once | RESPIRATORY_TRACT | Status: AC
  Administered 2020-02-14: 3 mL via RESPIRATORY_TRACT
  Filled 2020-02-14: qty 3

## 2020-02-14 MED ORDER — ASCORBIC ACID 500 MG PO TABS
500.0000 mg | ORAL_TABLET | Freq: Every day | ORAL | Status: DC
  Administered 2020-02-15 – 2020-02-20 (×6): 500 mg via ORAL
  Filled 2020-02-14 (×6): qty 1

## 2020-02-14 MED ORDER — POTASSIUM CHLORIDE ER 20 MEQ PO TBCR: 20.0000 meq | EXTENDED_RELEASE_TABLET | Freq: Every day | ORAL | Status: DC | PRN

## 2020-02-14 MED ORDER — SERTRALINE HCL 50 MG PO TABS
100.0000 mg | ORAL_TABLET | Freq: Every day | ORAL | Status: DC
  Administered 2020-02-15 – 2020-02-20 (×6): 100 mg via ORAL
  Filled 2020-02-14 (×6): qty 2

## 2020-02-14 MED ORDER — ONDANSETRON HCL 4 MG PO TABS: 4.0000 mg | ORAL_TABLET | Freq: Three times a day (TID) | ORAL | Status: DC | PRN

## 2020-02-14 MED ORDER — METHYLPREDNISOLONE SODIUM SUCC 40 MG IJ SOLR
40.0000 mg | Freq: Three times a day (TID) | INTRAMUSCULAR | Status: AC
  Administered 2020-02-14 – 2020-02-15 (×4): 40 mg via INTRAVENOUS
  Filled 2020-02-14 (×4): qty 1

## 2020-02-14 MED ORDER — ATORVASTATIN CALCIUM 20 MG PO TABS
40.0000 mg | ORAL_TABLET | Freq: Every day | ORAL | Status: DC
  Administered 2020-02-15 – 2020-02-20 (×6): 40 mg via ORAL
  Filled 2020-02-14 (×6): qty 2

## 2020-02-14 MED ORDER — ZINC SULFATE 220 (50 ZN) MG PO CAPS
220.0000 mg | ORAL_CAPSULE | Freq: Every day | ORAL | Status: DC
  Administered 2020-02-15 – 2020-02-20 (×6): 220 mg via ORAL
  Filled 2020-02-14 (×6): qty 1

## 2020-02-14 MED ORDER — ALENDRONATE SODIUM 70 MG PO TABS: 70.0000 mg | ORAL_TABLET | ORAL | Status: DC

## 2020-02-14 MED ORDER — SULFAMETHOXAZOLE-TRIMETHOPRIM 400-80 MG PO TABS
0.5000 | ORAL_TABLET | Freq: Every day | ORAL | Status: DC
  Administered 2020-02-15 – 2020-02-18 (×4): 0.5 via ORAL
  Filled 2020-02-14 (×4): qty 1

## 2020-02-14 MED ORDER — ACETAMINOPHEN 500 MG PO TABS
1000.0000 mg | ORAL_TABLET | Freq: Once | ORAL | Status: AC
  Administered 2020-02-14: 1000 mg via ORAL
  Filled 2020-02-14: qty 2

## 2020-02-14 MED ORDER — IPRATROPIUM-ALBUTEROL 0.5-2.5 (3) MG/3ML IN SOLN
3.0000 mL | Freq: Four times a day (QID) | RESPIRATORY_TRACT | Status: DC
  Administered 2020-02-15 (×2): 3 mL via RESPIRATORY_TRACT
  Filled 2020-02-14 (×2): qty 3

## 2020-02-14 MED ORDER — MAGNESIUM HYDROXIDE 400 MG/5ML PO SUSP: 30.0000 mL | Freq: Every day | ORAL | Status: DC | PRN

## 2020-02-14 MED ORDER — DIAZEPAM 5 MG PO TABS: 5.0000 mg | ORAL_TABLET | Freq: Three times a day (TID) | ORAL | Status: DC | PRN

## 2020-02-14 MED ORDER — ONDANSETRON HCL 4 MG PO TABS: 4.0000 mg | ORAL_TABLET | Freq: Four times a day (QID) | ORAL | Status: DC | PRN

## 2020-02-14 MED ORDER — CLOPIDOGREL BISULFATE 75 MG PO TABS
75.0000 mg | ORAL_TABLET | Freq: Every day | ORAL | Status: DC
  Administered 2020-02-15 – 2020-02-20 (×6): 75 mg via ORAL
  Filled 2020-02-14 (×6): qty 1

## 2020-02-14 MED ORDER — KETOROLAC TROMETHAMINE 15 MG/ML IJ SOLN
15.0000 mg | Freq: Four times a day (QID) | INTRAMUSCULAR | Status: DC | PRN
  Administered 2020-02-15 – 2020-02-16 (×4): 15 mg via INTRAVENOUS
  Filled 2020-02-14 (×5): qty 1

## 2020-02-14 MED ORDER — TRIMETHOPRIM 100 MG PO TABS
100.0000 mg | ORAL_TABLET | Freq: Every day | ORAL | Status: DC
  Administered 2020-02-15 – 2020-02-16 (×2): 100 mg via ORAL
  Filled 2020-02-14 (×3): qty 1

## 2020-02-14 MED ORDER — TRAZODONE HCL 100 MG PO TABS: 100.0000 mg | ORAL_TABLET | Freq: Every day | ORAL | Status: DC

## 2020-02-14 MED ORDER — ASPIRIN EC 81 MG PO TBEC
81.0000 mg | DELAYED_RELEASE_TABLET | Freq: Every day | ORAL | Status: DC
  Administered 2020-02-15 – 2020-02-20 (×6): 81 mg via ORAL
  Filled 2020-02-14 (×6): qty 1

## 2020-02-14 MED ORDER — PANTOPRAZOLE SODIUM 40 MG PO TBEC
40.0000 mg | DELAYED_RELEASE_TABLET | Freq: Every day | ORAL | Status: DC
  Administered 2020-02-15 – 2020-02-20 (×6): 40 mg via ORAL
  Filled 2020-02-14 (×6): qty 1

## 2020-02-14 MED ORDER — ONDANSETRON HCL 4 MG/2ML IJ SOLN: 4.0000 mg | Freq: Four times a day (QID) | INTRAMUSCULAR | Status: DC | PRN

## 2020-02-14 MED ORDER — SODIUM CHLORIDE 0.9% FLUSH: 3.0000 mL | Freq: Once | INTRAVENOUS | Status: DC

## 2020-02-14 MED ORDER — LEVOTHYROXINE SODIUM 50 MCG PO TABS
50.0000 ug | ORAL_TABLET | Freq: Every day | ORAL | Status: DC
  Administered 2020-02-15 – 2020-02-20 (×6): 50 ug via ORAL
  Filled 2020-02-14 (×6): qty 1

## 2020-02-14 MED ORDER — IOHEXOL 350 MG/ML SOLN
75.0000 mL | Freq: Once | INTRAVENOUS | Status: AC | PRN
  Administered 2020-02-14: 75 mL via INTRAVENOUS

## 2020-02-14 MED ORDER — GUAIFENESIN ER 600 MG PO TB12
600.0000 mg | ORAL_TABLET | Freq: Two times a day (BID) | ORAL | Status: DC
  Administered 2020-02-14 – 2020-02-20 (×12): 600 mg via ORAL
  Filled 2020-02-14 (×12): qty 1

## 2020-02-14 MED ORDER — ENOXAPARIN SODIUM 40 MG/0.4ML ~~LOC~~ SOLN
40.0000 mg | SUBCUTANEOUS | Status: DC
  Administered 2020-02-14 – 2020-02-19 (×6): 40 mg via SUBCUTANEOUS
  Filled 2020-02-14 (×6): qty 0.4

## 2020-02-14 MED ORDER — SODIUM CHLORIDE 0.9 % IV SOLN
1.0000 g | INTRAVENOUS | Status: DC
  Administered 2020-02-15: 1 g via INTRAVENOUS
  Filled 2020-02-14 (×2): qty 10

## 2020-02-14 MED ORDER — VITAMIN D 25 MCG (1000 UNIT) PO TABS
5000.0000 [IU] | ORAL_TABLET | Freq: Every day | ORAL | Status: DC
  Administered 2020-02-15 – 2020-02-20 (×6): 5000 [IU] via ORAL
  Filled 2020-02-14 (×6): qty 5

## 2020-02-14 MED ORDER — ADULT MULTIVITAMIN W/MINERALS CH
1.0000 | ORAL_TABLET | Freq: Every day | ORAL | Status: DC
  Administered 2020-02-15 – 2020-02-20 (×6): 1 via ORAL
  Filled 2020-02-14 (×6): qty 1

## 2020-02-14 MED ORDER — ACETAMINOPHEN 325 MG PO TABS
650.0000 mg | ORAL_TABLET | Freq: Four times a day (QID) | ORAL | Status: DC | PRN
  Administered 2020-02-15 – 2020-02-19 (×3): 650 mg via ORAL
  Filled 2020-02-14 (×3): qty 2

## 2020-02-14 MED ORDER — FUROSEMIDE 40 MG PO TABS: 20.0000 mg | ORAL_TABLET | Freq: Every day | ORAL | Status: DC | PRN

## 2020-02-14 NOTE — ED Triage Notes (Addendum)
First RN Note: Pt presents to ED via [facility] with c/o generalized weakness since having a back surgery on May 5. Per EMS pt was sent from PCP low O2 sats. EMS reports hx of COPD and CHF with PRN O2 ordered, but has been wearing O2 continuously x several days.    500cc NS   100HR NSR 97% 3L  130/70 CBG 130   Pt reports "I live at home with my daughter and she just can't seem to handle getting me in and out of the car no more".

## 2020-02-14 NOTE — Telephone Encounter (Signed)
Spoke with Enrique Sack informing her Dr. Reece Agar is giving verbal orders for services requested.

## 2020-02-14 NOTE — Telephone Encounter (Signed)
Agree with this. Thakns.  

## 2020-02-14 NOTE — ED Notes (Signed)
Pt provided turkey sandwich tray and water.

## 2020-02-14 NOTE — H&P (Addendum)
[place] [facility]   PATIENT NAME: [name] [name]    MR#:  [id number]  DATE OF BIRTH:  [birth year]  DATE OF ADMISSION:  02/14/2020  PRIMARY CARE PHYSICIAN: Eustaquio BoydenGutierrez, Javier, MD   REQUESTING/REFERRING PHYSICIAN: Willy Eddyobinson, Patrick, MD  CHIEF COMPLAINT:   Chief Complaint  Patient presents with   Weakness   Low O2    HISTORY OF PRESENT ILLNESS:  [name] BruinHattie [name]  is a 74 y.o. Caucasian female with a known history of COPD, hypertension, dyslipidemia, hypothyroidism, CHF and peripheral vascular disease, who presented to the emergency room with acute onset of generalized weakness as well as worsening dyspnea and increased O2 demand with her home oxygen since her recent kyphoplasty.  She usually uses 2 L of O2 at bedtime and recently has been needing to 3 L throughout the day.  She admits to cough with chest congestion and inability to expectorate.  This is been associated with wheezing as well.  She admitted to left shoulder pain with no radiation.  No nausea or vomiting or abdominal pain.  She has been having diminished urinary output without dysuria, urinary frequency or urgency or hematuria or flank pain.  She had her second dose of modern vaccine on 4/26.  No bleeding diathesis.  She has been taking tramadol, oxycodone and Valium together with trazodone and melatonin at night.  Painkillers and muscle relaxants were given after her kyphoplasty.  Upon presentation to the emergency room, vital signs were within normal and later blood pressure dropped to 85/51.  Pulse oximetry was 89-91% on room air 100 % on 2 L of O2 by nasal cannula.  Venous blood gas showed pH 7.32 and bicarbonate of 29.4.  Urinalysis showed 11-20 WBCs with no bacteria, negative nitrite and small leukocytes.  BMP was unremarkable and BN P was 71.2.  High-sensitivity troponin I was 7 twice and CBC was unremarkable.  Two-view chest x-ray showed shallow lung inflation and bibasal atelectasis.  Chest CTA showed  no evidence for PE.  Revealed mild bibasilar atelectatic changes and 8 mm nodule in the right lower lobe that is felt to be postinflammatory and was not seen on a recent CT from 2 months ago.  It showed aortic atherosclerosis and compression fracture of T9 and T10 with augmentation at T9.  The patient was given 1 g of p.o. Tylenol and DuoNebs.  She will be admitted to an observation medically monitor bed for further evaluation and management. PAST MEDICAL HISTORY:   Past Medical History:  Diagnosis Date   (HFpEF) heart failure with preserved ejection fraction (HCC)    a. 11/2019 Echo: EF 60-65%, no rwma. Mild LVH. Mild AI, mild to mod AS.   Aneurysm (HCC)    She had 2.6 cm dilation of the infrarenal abdominal aorta on 08/01/15 CTA with 5 year US recommended   Anxiety    Anxiety and depression    Aortic stenosis    mild-moderate AS 12/23/19 echo   Carotid stenosis    R 50% (12/2012)   CHF (congestive heart failure) (HCC)    Compression fracture of body of thoracic vertebra (HCC)    T 10   Concussion 08/03/2015   COPD (chronic obstructive pulmonary disease) (HCC) 12/2012   spirometry: Pre: FVC 84%, FEV1 69%, ratio 0.64 consistent with moderate obstruction.   Depression    Fall 08/03/2015   d/c home health 08/2015   Fracture of cervical vertebra, C5 (HCC) 08/06/2015   History of chicken pox    Hyperlipidemia  Hypertension    Hypothyroidism    Lower back pain    h/o HNP s/p surgery   Neuropathy    B/L feet   On supplemental oxygen by nasal cannula    at HS and PRN during the day   Osteoarthritis    h/o ruptured disc s/p ESI   Osteoporosis 11/2010   DEXA -2.7 spine, thoracic compression fracture   Peripheral vascular disease (HCC)    Smoker    quit 10/2012   Stroke [facility]) 2010   x3 with residual R hemiparesis, s/p R MCA balloon angioplasty (2010)   Wears dentures    upper    PAST SURGICAL HISTORY:   Past Surgical History:  Procedure Laterality Date     APPENDECTOMY  1960   CATARACT EXTRACTION     bilateral   CESAREAN SECTION     CHOLECYSTECTOMY  1970   COLONOSCOPY  2004   diverticulosis, no polyps Jarold Motto)   COLONOSCOPY  05/2016   decreased sphincter tone, diverticulosis, no f/u recommended ([place]is)   DEXA  11/2010   T -2.7 spine, -1.9 hip   HIP SURGERY Left 2006   fractured - screws placed   IR ANGIO INTRA EXTRACRAN SEL COM CAROTID INNOMINATE BILAT MOD SED  08/01/2018   IR ANGIO INTRA EXTRACRAN SEL COM CAROTID INNOMINATE BILAT MOD SED  12/10/2018   IR ANGIO VERTEBRAL SEL SUBCLAVIAN INNOMINATE UNI R MOD SED  08/01/2018   IR ANGIO VERTEBRAL SEL SUBCLAVIAN INNOMINATE UNI R MOD SED  12/10/2018   IR ANGIO VERTEBRAL SEL VERTEBRAL UNI L MOD SED  12/10/2018   IR RADIOLOGIST EVAL & MGMT  01/09/2018   KYPHOPLASTY  10/02/2017   Procedure: LUMBAR FOUR KYPHOPLASTY;  Surgeon: Coletta Memos, MD;  Location: [facility] OR;  Service: Neurosurgery;;   KYPHOPLASTY N/A 01/29/2020   Procedure: Thoracic Ten Kyphoplasty;  Surgeon: Coletta Memos, MD;  Location: [facility] OR;  Service: Neurosurgery;  Laterality: N/A;   RADIOLOGY WITH ANESTHESIA N/A 11/11/2015   Procedure: RADIOLOGY WITH ANESTHESIA;  Surgeon: Julieanne Cotton, MD;  Location: [facility] OR;  Service: Radiology;  Laterality: N/A;   RADIOLOGY WITH ANESTHESIA N/A 12/10/2018   Procedure: STENTING;  Surgeon: Julieanne Cotton, MD;  Location: [facility] OR;  Service: Radiology;  Laterality: N/A;    SOCIAL HISTORY:   Social History   Tobacco Use   Smoking status: Former Smoker    Packs/day: 2.00    Years: 46.00    Pack years: 92.00    Types: Cigarettes    Quit date: 11/08/2012    Years since quitting: 7.2   Smokeless tobacco: Never Used  Substance Use Topics   Alcohol use: No    Alcohol/week: 0.0 standard drinks    FAMILY HISTORY:   Family History  Problem Relation Age of Onset   CAD Mother        MI   Cancer Mother        cervical   CAD Maternal Aunt        MI   CAD Sister        MI    Sudden death Father 71       died in his sleep, chain smoker   Cirrhosis Father    Hypertension Daughter    Diabetes Maternal Aunt    Cancer Maternal Grandmother        cervical    DRUG ALLERGIES:   Allergies  Allergen Reactions   Doxycycline Nausea Only and Other (See Comments)    Sig GI upset   Fluticasone-Salmeterol Anxiety  REVIEW OF SYSTEMS:   ROS As per history of present illness. All pertinent systems were reviewed above. Constitutional,  HEENT, cardiovascular, respiratory, GI, GU, musculoskeletal, neuro, psychiatric, endocrine,  integumentary and hematologic systems were reviewed and are otherwise  negative/unremarkable except for positive findings mentioned above in the HPI.   MEDICATIONS AT HOME:   Prior to Admission medications   Medication Sig Start Date End Date Taking? Authorizing Provider  acetaminophen (TYLENOL) 500 MG tablet Take 1 tablet (500 mg total) by mouth every 6 (six) hours as needed for headache. 01/01/19  Yes [name], MD  albuterol (VENTOLIN HFA) 108 (90 Base) MCG/ACT inhaler INHALE 2 PUFFS INTO THE LUNGS EVERY 6 (SIX) HOURS AS NEEDED FOR WHEEZING OR SHORTNESS OF BREATH. Patient taking differently: Inhale 2 puffs into the lungs daily.  02/03/20  Yes [name], MD  alendronate (FOSAMAX) 70 MG tablet TAKE 1 TABLET WEEKLY. TAKE WITH A FULL GLASS OF WATER ON AN EMPTY STOMACH. Patient taking differently: Take 70 mg by mouth every Tuesday. TAKE WITH A FULL GLASS OF WATER ON AN EMPTY STOMACH. 01/07/20  Yes [name], MD  aspirin (ASPIRIN 81) 81 MG EC tablet Take 1 tablet (81 mg total) by mouth daily. Swallow whole. 12/15/18  Yes [name], MD  atorvastatin (LIPITOR) 40 MG tablet TAKE 1 TABLET EVERY DAY 01/24/20  Yes [name], MD  Cholecalciferol (VITAMIN D3) 125 MCG (5000 UT) CAPS Take 5,000 Units by mouth daily. Gummies   Yes [provider]  clopidogrel (PLAVIX) 75 MG tablet TAKE 1 TABLET (75 MG  TOTAL) BY MOUTH DAILY. 11/19/19  Yes [name], MD  Cranberry-Vitamin C (AZO CRANBERRY URINARY TRACT PO) Take 1 tablet by mouth in the morning and at bedtime.    Yes [provider]  diazepam (VALIUM) 5 MG tablet Take 5 mg by mouth every 8 (eight) hours as needed for muscle spasms. Per surgeon 02/05/20   Yes [provider]  furosemide (LASIX) 40 MG tablet Take 20 mg by mouth daily as needed for fluid. Weight every morning before given 11/21/19  Yes [name], MD  gabapentin (NEURONTIN) 100 MG capsule TAKE 1 CAPSULE (100 MG TOTAL) BY MOUTH AT BEDTIME. 01/10/20  Yes [name], MD  HYDROcodone-acetaminophen (NORCO/VICODIN) 5-325 MG tablet Take 1 tablet by mouth 3 (three) times daily as needed for moderate pain (If not controlled by tramadol). 01/07/20  Yes [name], MD  levothyroxine (SYNTHROID) 50 MCG tablet TAKE 1 TABLET  DAILY BEFORE BREAKFAST. 09/12/19  Yes [name], MD  Melatonin 5 MG CHEW Chew 5 mg by mouth daily. Gummies   Yes [provider]  methocarbamol (ROBAXIN) 500 MG tablet Take 1 tablet (500 mg total) by mouth every 8 (eight) hours as needed for muscle spasms. 01/07/20  Yes [name], MD  Multiple Vitamins-Minerals (MULTIVITAMIN PO) Take 1 tablet by mouth daily. Gummie   Yes [provider]  ondansetron (ZOFRAN) 4 MG tablet Take 1 tablet (4 mg total) by mouth every 8 (eight) hours as needed for nausea or vomiting. 12/20/19  Yes [name], MD  oxyCODONE (OXY IR/ROXICODONE) 5 MG immediate release tablet Take 5 mg by mouth every 6 (six) hours as needed. 02/04/20  Yes [provider]  OXYGEN Inhale 2 L/min into the lungs See admin instructions. Inhale 2 L/min of oxygen at bedtime and during the day as needed for shortness of breath   Yes [provider]  pantoprazole (PROTONIX) 40 MG tablet Take 1 tablet (40 mg total) by  mouth daily as needed. Patient taking differently: Take 40 mg by mouth  daily.  01/07/20  Yes Ria Bush, MD  Potassium Chloride ER 20 MEQ TBCR TAKE 2 TABLETS BY MOUTH AS NEEDED (WHENEVER  YOU  TAKE  LASIX) Patient taking differently: Take 20 mEq by mouth daily as needed (Only when takes Lasix).  11/07/19  Yes Ria Bush, MD  sertraline (ZOLOFT) 100 MG tablet TAKE 1 TABLET EVERY DAY 01/24/20  Yes Ria Bush, MD  sulfamethoxazole-trimethoprim (BACTRIM) 400-80 MG tablet Take 0.5 tablets by mouth daily. 01/20/20  Yes [provider]  traMADol (ULTRAM) 50 MG tablet Take 1-2 tablets (50-100 mg total) by mouth 2 (two) times daily as needed for moderate pain. 01/07/20  Yes Ria Bush, MD  traZODone (DESYREL) 100 MG tablet Take 1.5 tablets (150 mg total) by mouth at bedtime. Patient taking differently: Take 100 mg by mouth at bedtime.  01/15/20  Yes Ria Bush, MD  trimethoprim (TRIMPEX) 100 MG tablet Take 1 tablet (100 mg total) by mouth daily. Take daily to prevent UTI 11/05/19  Yes Vaillancourt, Aldona Bar, PA-C  vitamin C (ASCORBIC ACID) 250 MG tablet Take 500 mg by mouth daily. Gummies   Yes [provider]  Zinc 30 MG TABS Take 30 mg by mouth daily. Gummies   Yes [provider]      VITAL SIGNS:  Blood pressure 109/85, pulse 74, temperature 98.5 F (36.9 C), temperature source Oral, resp. rate 13, height 4\' 11"  (1.499 m), weight 77.1 kg, SpO2 99 %.  PHYSICAL EXAMINATION:  Physical Exam  GENERAL:  74 y.o.-year-old Caucasian female patient lying in the bed with minimal respiratory distress with conversational dyspnea. EYES: Pupils equal, round, reactive to light and accommodation. No scleral icterus. Extraocular muscles intact.  HEENT: Head atraumatic, normocephalic. Oropharynx and nasopharynx clear.  NECK:  Supple, no jugular venous distention. No thyroid enlargement, no tenderness.  LUNGS: Diffuse expiratory wheezes and rhonchi with diminished expiratory airflow and harsh vesicular breathing. CARDIOVASCULAR:  Regular rate and rhythm, S1, S2 normal. No murmurs, rubs, or gallops.  ABDOMEN: Soft, nondistended, nontender. Bowel sounds present. No organomegaly or mass.  EXTREMITIES: No pedal edema, cyanosis, or clubbing.  NEUROLOGIC: Cranial nerves II through XII are intact. Muscle strength 5/5 in all extremities. Sensation intact. Gait not checked.  PSYCHIATRIC: The patient is alert and oriented x 3.  Normal affect and good eye contact. SKIN: No obvious rash, lesion, or ulcer.   LABORATORY PANEL:   CBC Recent Labs  Lab 02/14/20 1356  WBC 10.5  HGB 12.2  HCT 38.4  PLT 208   ------------------------------------------------------------------------------------------------------------------  Chemistries  Recent Labs  Lab 02/14/20 1356  NA 139  K 4.6  CL 104  CO2 27  GLUCOSE 98  BUN 18  CREATININE 1.00  CALCIUM 8.7*   ------------------------------------------------------------------------------------------------------------------  Cardiac Enzymes No results for input(s): TROPONINI in the last 168 hours. ------------------------------------------------------------------------------------------------------------------  RADIOLOGY:  DG Chest 2 View  Result Date: 02/14/2020 CLINICAL DATA:  Weakness and shortness of breath. Evaluate for infiltrate/edema. EXAM: CHEST - 2 VIEW COMPARISON:  12/22/2019 FINDINGS: Shallow lung inflation. Heart is enlarged. There are is atelectasis in both lung bases. Less likely, the findings could be related to early infiltrate. Small LEFT pleural effusion. Remote rib fractures. Remote vertebral augmentation. Surgical clips are present in the UPPER abdomen. IMPRESSION: 1. Shallow lung inflation. 2. Bibasilar atelectasis. Electronically Signed   By: Nolon Nations M.D.   On: 02/14/2020 16:14   CT Angio Chest PE W and/or Wo Contrast  Result Date: 02/14/2020 CLINICAL DATA:  Shortness of breath for 1 week EXAM: CT ANGIOGRAPHY CHEST WITH CONTRAST TECHNIQUE:  Multidetector CT imaging of the chest was performed using the standard protocol during bolus administration of intravenous contrast. Multiplanar CT image reconstructions and MIPs were obtained to evaluate the vascular anatomy. CONTRAST:  38mL OMNIPAQUE IOHEXOL 350 MG/ML SOLN COMPARISON:  Chest x-ray from earlier in the same day CT from 12/22/2019. FINDINGS: Cardiovascular: Thoracic aorta demonstrates atherosclerotic calcification without aneurysmal dilatation or dissection. Coronary calcifications are seen. No significant cardiac enlargement is noted. Pulmonary artery shows a normal branching pattern. No intraluminal filling defect to suggest pulmonary embolism is seen. Mediastinum/Nodes: Thoracic inlet is within normal limits. No hilar or mediastinal adenopathy is noted. The esophagus as visualized is within normal limits. Lungs/Pleura: Mild bibasilar atelectasis is seen. A focal 8 mm nodule is noted in the right lung base likely postinflammatory in nature is it was not seen on prior CT examination. The lungs are otherwise clear. No sizable effusion is seen. Upper Abdomen: Gallbladder has been surgically removed. No other focal abnormality is noted. Musculoskeletal: Degenerative changes of the thoracic spine are noted. Changes of prior vertebral augmentation are seen at T9 with evidence of a chronic compression deformity at T10. Review of the MIP images confirms the above findings. IMPRESSION: No evidence of pulmonary emboli. Mild bibasilar atelectatic changes. 8 mm nodule in the right lower lobe best seen on image number 48 of series 8. This is felt to be postinflammatory in nature as it was not seen on a recent CT from 2 months previous. Compression fractures at T9 and T10 with augmentation at T9. Aortic Atherosclerosis (ICD10-I70.0). Electronically Signed   By: Alcide Clever M.D.   On: 02/14/2020 19:51      IMPRESSION AND PLAN:   1.  Mild acute on chronic hypoxic respiratory failure that is multifactorial.   This could be related to polypharmacy as well as mild COPD exacerbation. -The patient will be admitted to a an observation medical monitored bed. -We will hold all her sedatives medications including muscle relaxants and narcotics for now. -We will place her on steroid therapy with IV Solu-Medrol as well as DuoNebs 4 times daily and every 4 hours as needed. -We will add mucolytic therapy as well as IV Rocephin. -O2 protocol will be followed. -Given her recent kyphoplasty, she will be placed on as needed IV Toradol for pain.  2.  Hypotension. -This could be related to volume depletion and dehydration especially given diminished urine output. -She will be gently hydrated with IV normal saline. -We will hold her diuretics  3.  Hypothyroidism. -We will check her TSH and continue Synthroid.  4.  Depression. -We will resume her Zoloft for now and hold off trazodone.  5.  Possible UTI. -Will check urine culture and sensitivity. -The patient will be on IV Rocephin as mentioned above mainly for her COPD severe exacerbation which should cover UTI.  6.  Dyslipidemia. -We will continue her statin therapy.  7.  Peripheral vascular disease. -We will continue her aspirin and Plavix.  8.  DVT prophylaxis. -Subcutaneous Lovenox.   All the records are reviewed and case discussed with ED provider. The plan of care was discussed in details with the patient and her daughter.. I answered all questions. The patient and her daughter agreed to proceed with the above mentioned plan. Further management will depend upon hospital course.   CODE STATUS: Full code  Status is: Observation  The patient remains OBS appropriate  and will d/c before 2 midnights.  Dispo: The patient is from: Home              Anticipated d/c is to: Home              Anticipated d/c date is: 1 day              Patient currently is not medically stable to d/c.   TOTAL TIME TAKING CARE OF THIS PATIENT: 55 minutes.     Hannah Beat M.D on 02/14/2020 at 8:56 PM  [facility]   From 7 PM-7 AM, contact night-coverage [email or web address]  CC: Primary care physician; [name], MD   Note: This dictation was prepared with Dragon dictation along with smaller phrase technology. Any transcriptional errors that result from this process are unintentional.

## 2020-02-14 NOTE — ED Provider Notes (Signed)
[facility] Emergency Department Provider Note    First MD Initiated Contact with Patient 02/14/20 1458     (approximate)  I have reviewed the triage vital signs and the nursing notes.   HISTORY  Chief Complaint Weakness and Low O2    HPI [name] [name] is a 74 y.o. female   with a significant complex past medical history as listed below on home oxygen as needed presents to the ER for generalized weakness worsening shortness of breath concern for dehydration and hypoxia.  Patient recently reportedly had kyphoplasty and is on multiple pain medications according to the granddaughter at bedside.  Patient currently taking tramadol, oxycodone, Valium as well as some other sleeping aid.  Granddaughter is concerned that she is being overmedicated.  She is having increasing O2 requirements having to wear 2 to 3 L chronically.  Denies any chest pain or pressure.   Past Medical History:  Diagnosis Date  . (HFpEF) heart failure with preserved ejection fraction (HCC)    a. 11/2019 Echo: EF 60-65%, no rwma. Mild LVH. Mild AI, mild to mod AS.  [name]. Aneurysm (HCC)    She had 2.6 cm dilation of the infrarenal abdominal aorta on 08/01/15 CTA with 5 year US recommended  . Anxiety   . Anxiety and depression   . Aortic stenosis    mild-moderate AS 12/23/19 echo  . Carotid stenosis    R 50% (12/2012)  . CHF (congestive heart failure) (HCC)   . Compression fracture of body of thoracic vertebra (HCC)    T 10  . Concussion 08/03/2015  . COPD (chronic obstructive pulmonary disease) (HCC) 12/2012   spirometry: Pre: FVC 84%, FEV1 69%, ratio 0.64 consistent with moderate obstruction.  . Depression   . Fall 08/03/2015   d/c home health 08/2015  . Fracture of cervical vertebra, C5 (HCC) 08/06/2015  . History of chicken pox   . Hyperlipidemia   . Hypertension   . Hypothyroidism   . Lower back pain    h/o HNP s/p surgery  . Neuropathy    B/L feet  . On supplemental oxygen by  nasal cannula    at HS and PRN during the day  . Osteoarthritis    h/o ruptured disc s/p ESI  . Osteoporosis 11/2010   DEXA -2.7 spine, thoracic compression fracture  . Peripheral vascular disease (HCC)   . Smoker    quit 10/2012  . Stroke [facility](HCC) 2010   x3 with residual R hemiparesis, s/p R MCA balloon angioplasty (2010)  . Wears dentures    upper   Family History  Problem Relation Age of Onset  . CAD Mother        MI  . Cancer Mother        cervical  . CAD Maternal Aunt        MI  . CAD Sister        MI  . Sudden death Father [id number]       died in his sleep, chain smoker  . Cirrhosis Father   . Hypertension Daughter   . Diabetes Maternal Aunt   . Cancer Maternal Grandmother        cervical   Past Surgical History:  Procedure Laterality Date  . APPENDECTOMY  1960  . CATARACT EXTRACTION     bilateral  . CESAREAN SECTION    . CHOLECYSTECTOMY  1970  . COLONOSCOPY  2004   diverticulosis, no polyps Jarold Motto([place])  . COLONOSCOPY  05/2016  decreased sphincter tone, diverticulosis, no f/u recommended ([place])  . DEXA  11/2010   T -2.7 spine, -1.9 hip  . HIP SURGERY Left 2006   fractured - screws placed  . IR ANGIO INTRA EXTRACRAN SEL COM CAROTID INNOMINATE BILAT MOD SED  08/01/2018  . IR ANGIO INTRA EXTRACRAN SEL COM CAROTID INNOMINATE BILAT MOD SED  12/10/2018  . IR ANGIO VERTEBRAL SEL SUBCLAVIAN INNOMINATE UNI R MOD SED  08/01/2018  . IR ANGIO VERTEBRAL SEL SUBCLAVIAN INNOMINATE UNI R MOD SED  12/10/2018  . IR ANGIO VERTEBRAL SEL VERTEBRAL UNI L MOD SED  12/10/2018  . IR RADIOLOGIST EVAL & MGMT  01/09/2018  . KYPHOPLASTY  10/02/2017   Procedure: LUMBAR FOUR KYPHOPLASTY;  Surgeon: Coletta Memos, MD;  Location: [facility] OR;  Service: Neurosurgery;;  . KYPHOPLASTY N/A 01/29/2020   Procedure: Thoracic Ten Kyphoplasty;  Surgeon: Coletta Memos, MD;  Location: [facility] OR;  Service: Neurosurgery;  Laterality: N/A;  . RADIOLOGY WITH ANESTHESIA N/A 11/11/2015   Procedure: RADIOLOGY WITH ANESTHESIA;  Surgeon:  Julieanne Cotton, MD;  Location: [facility] OR;  Service: Radiology;  Laterality: N/A;  . RADIOLOGY WITH ANESTHESIA N/A 12/10/2018   Procedure: STENTING;  Surgeon: Julieanne Cotton, MD;  Location: [facility] OR;  Service: Radiology;  Laterality: N/A;   Patient Active Problem List   Diagnosis Date Noted  . Hypoxia 02/14/2020  . Closed T10 fracture (HCC) 01/29/2020  . Acute midline back pain 12/21/2019  . Hearing loss due to cerumen impaction, left 07/08/2019  . Neuropathy 05/06/2019  . CKD (chronic kidney disease) stage 3, GFR 30-59 ml/min 01/24/2019  . Tinnitus of both ears 11/24/2018  . Vision loss of left eye 07/31/2018  . Thrombocytopenia (HCC) 07/31/2018  . Tremor 07/03/2018  . Weakness 05/07/2018  . Chronic diastolic heart failure (HCC) 01/30/2018  . Memory loss 01/30/2018  . Peripheral edema 01/30/2018  . Intertrigo 01/30/2018  . Chronic back pain 11/21/2017  . Chronic respiratory failure with hypoxia (HCC) 11/14/2017  . Shortness of breath 11/14/2017  . Closed compression fracture of fourth lumbar vertebra (HCC) 10/02/2017  . Compression fracture of body of thoracic vertebra (HCC) 09/30/2017  . Hypothyroidism (acquired) 06/28/2017  . Vitamin D deficiency 06/25/2017  . Advanced care planning/counseling discussion 01/29/2015  . Encounter for general adult medical examination with abnormal findings 01/29/2015  . Carotid stenosis 04/03/2014  . Carotid artery aneurysm (HCC) 04/03/2014  . Recurrent falls 01/02/2014  . Hemiparesis affecting right side as late effect of stroke (HCC) 01/02/2014  . Insomnia 01/10/2013  . Medicare annual wellness visit, subsequent 11/26/2012  . Hypertension   . History of lacunar cerebrovascular accident (CVA)   . Peripheral vascular disease (HCC)   . COPD (chronic obstructive pulmonary disease) (HCC)   . Hyperlipidemia   . Osteoarthritis   . MDD (major depressive disorder), recurrent episode, moderate (HCC)   . Ex-smoker   . Osteoporosis 11/25/2010       Prior to Admission medications   Medication Sig Start Date End Date Taking? Authorizing Provider  acetaminophen (TYLENOL) 500 MG tablet Take 1 tablet (500 mg total) by mouth every 6 (six) hours as needed for headache. 01/01/19  Yes [name], MD  albuterol (VENTOLIN HFA) 108 (90 Base) MCG/ACT inhaler INHALE 2 PUFFS INTO THE LUNGS EVERY 6 (SIX) HOURS AS NEEDED FOR WHEEZING OR SHORTNESS OF BREATH. Patient taking differently: Inhale 2 puffs into the lungs daily.  02/03/20  Yes [name], MD  alendronate (FOSAMAX) 70 MG tablet TAKE 1 TABLET WEEKLY. TAKE WITH A FULL GLASS  OF WATER ON AN EMPTY STOMACH. Patient taking differently: Take 70 mg by mouth every Tuesday. TAKE WITH A FULL GLASS OF WATER ON AN EMPTY STOMACH. 01/07/20  Yes Ria Bush, MD  aspirin (ASPIRIN 81) 81 MG EC tablet Take 1 tablet (81 mg total) by mouth daily. Swallow whole. 12/15/18  Yes Ria Bush, MD  atorvastatin (LIPITOR) 40 MG tablet TAKE 1 TABLET EVERY DAY 01/24/20  Yes Ria Bush, MD  Cholecalciferol (VITAMIN D3) 125 MCG (5000 UT) CAPS Take 5,000 Units by mouth daily. Gummies   Yes [provider]  clopidogrel (PLAVIX) 75 MG tablet TAKE 1 TABLET (75 MG TOTAL) BY MOUTH DAILY. 11/19/19  Yes Ria Bush, MD  Cranberry-Vitamin C (AZO CRANBERRY URINARY TRACT PO) Take 1 tablet by mouth in the morning and at bedtime.    Yes [provider]  diazepam (VALIUM) 5 MG tablet Take 5 mg by mouth every 8 (eight) hours as needed for muscle spasms. Per surgeon 02/05/20   Yes [provider]  furosemide (LASIX) 40 MG tablet Take 20 mg by mouth daily as needed for fluid. Weight every morning before given 11/21/19  Yes Ria Bush, MD  gabapentin (NEURONTIN) 100 MG capsule TAKE 1 CAPSULE (100 MG TOTAL) BY MOUTH AT BEDTIME. 01/10/20  Yes Ria Bush, MD  HYDROcodone-acetaminophen (NORCO/VICODIN) 5-325 MG tablet Take 1 tablet by mouth 3 (three) times daily as needed for moderate  pain (If not controlled by tramadol). 01/07/20  Yes Ria Bush, MD  levothyroxine (SYNTHROID) 50 MCG tablet TAKE 1 TABLET  DAILY BEFORE BREAKFAST. 09/12/19  Yes Ria Bush, MD  Melatonin 5 MG CHEW Chew 5 mg by mouth daily. Gummies   Yes [provider]  methocarbamol (ROBAXIN) 500 MG tablet Take 1 tablet (500 mg total) by mouth every 8 (eight) hours as needed for muscle spasms. 01/07/20  Yes Ria Bush, MD  Multiple Vitamins-Minerals (MULTIVITAMIN PO) Take 1 tablet by mouth daily. Gummie   Yes [provider]  ondansetron (ZOFRAN) 4 MG tablet Take 1 tablet (4 mg total) by mouth every 8 (eight) hours as needed for nausea or vomiting. 12/20/19  Yes Ria Bush, MD  oxyCODONE (OXY IR/ROXICODONE) 5 MG immediate release tablet Take 5 mg by mouth every 6 (six) hours as needed. 02/04/20  Yes [provider]  OXYGEN Inhale 2 L/min into the lungs See admin instructions. Inhale 2 L/min of oxygen at bedtime and during the day as needed for shortness of breath   Yes [provider]  pantoprazole (PROTONIX) 40 MG tablet Take 1 tablet (40 mg total) by mouth daily as needed. Patient taking differently: Take 40 mg by mouth daily.  01/07/20  Yes Ria Bush, MD  Potassium Chloride ER 20 MEQ TBCR TAKE 2 TABLETS BY MOUTH AS NEEDED (WHENEVER  YOU  TAKE  LASIX) Patient taking differently: Take 20 mEq by mouth daily as needed (Only when takes Lasix).  11/07/19  Yes Ria Bush, MD  sertraline (ZOLOFT) 100 MG tablet TAKE 1 TABLET EVERY DAY 01/24/20  Yes Ria Bush, MD  sulfamethoxazole-trimethoprim (BACTRIM) 400-80 MG tablet Take 0.5 tablets by mouth daily. 01/20/20  Yes [provider]  traMADol (ULTRAM) 50 MG tablet Take 1-2 tablets (50-100 mg total) by mouth 2 (two) times daily as needed for moderate pain. 01/07/20  Yes Ria Bush, MD  traZODone (DESYREL) 100 MG tablet Take 1.5 tablets (150 mg total) by mouth at bedtime. Patient  taking differently: Take 100 mg by mouth at bedtime.  01/15/20  Yes Danise Mina,  Wynona Canes, MD  trimethoprim (TRIMPEX) 100 MG tablet Take 1 tablet (100 mg total) by mouth daily. Take daily to prevent UTI 11/05/19  Yes Vaillancourt, Lelon Mast, PA-C  vitamin C (ASCORBIC ACID) 250 MG tablet Take 500 mg by mouth daily. Gummies   Yes [provider]  Zinc 30 MG TABS Take 30 mg by mouth daily. Gummies   Yes [provider]    Allergies Doxycycline and Fluticasone-salmeterol    Social History Social History   Tobacco Use  . Smoking status: Former Smoker    Packs/day: 2.00    Years: 46.00    Pack years: 92.00    Types: Cigarettes    Quit date: 11/08/2012    Years since quitting: 7.2  . Smokeless tobacco: Never Used  Substance Use Topics  . Alcohol use: No    Alcohol/week: 0.0 standard drinks  . Drug use: No    Review of Systems Patient denies headaches, rhinorrhea, blurry vision, numbness, shortness of breath, chest pain, edema, cough, abdominal pain, nausea, vomiting, diarrhea, dysuria, fevers, rashes or hallucinations unless otherwise stated above in HPI. ____________________________________________   PHYSICAL EXAM:  VITAL SIGNS: Vitals:   02/14/20 2138 02/14/20 2224  BP: (!) 102/34 (!) 106/53  Pulse: 79 85  Resp: 16 14  Temp: 98.1 F (36.7 C)   SpO2: 98% 98%    Constitutional: Alert and oriented.  Eyes: Conjunctivae are normal.  Head: Atraumatic. Nose: No congestion/rhinnorhea. Mouth/Throat: Mucous membranes are moist.   Neck: No stridor. Painless ROM.  Cardiovascular: Normal rate, regular rhythm. Grossly normal heart sounds.  Good peripheral circulation. Respiratory: Normal respiratory effort.  No retractions. Lungs with coarse bs throughout Gastrointestinal: Soft and nontender. No distention. No abdominal bruits. No CVA tenderness. Genitourinary:  Musculoskeletal: No lower extremity tenderness nor edema.  No joint effusions. Neurologic:  Normal speech  and language. No gross focal neurologic deficits are appreciated. No facial droop Skin:  Skin is warm, dry and intact. No rash noted. Psychiatric: Mood and affect are normal. Speech and behavior are normal.  ____________________________________________   LABS (all labs ordered are listed, but only abnormal results are displayed)  Results for orders placed or performed during the hospital encounter of 02/14/20 (from the past 24 hour(s))  Basic metabolic panel     Status: Abnormal   Collection Time: 02/14/20  1:56 PM  Result Value Ref Range   Sodium 139 135 - 145 mmol/L   Potassium 4.6 3.5 - 5.1 mmol/L   Chloride 104 98 - 111 mmol/L   CO2 27 22 - 32 mmol/L   Glucose, Bld 98 70 - 99 mg/dL   BUN 18 8 - 23 mg/dL   Creatinine, Ser 6.01 0.44 - 1.00 mg/dL   Calcium 8.7 (L) 8.9 - 10.3 mg/dL   GFR calc non Af Amer 56 (L) >60 mL/min   GFR calc Af Amer >60 >60 mL/min   Anion gap 8 5 - 15  CBC     Status: None   Collection Time: 02/14/20  1:56 PM  Result Value Ref Range   WBC 10.5 4.0 - 10.5 K/uL   RBC 4.10 3.87 - 5.11 MIL/uL   Hemoglobin 12.2 12.0 - 15.0 g/dL   HCT 09.3 23.5 - 57.3 %   MCV 93.7 80.0 - 100.0 fL   MCH 29.8 26.0 - 34.0 pg   MCHC 31.8 30.0 - 36.0 g/dL   RDW 22.0 25.4 - 27.0 %   Platelets 208 150 - 400 K/uL   nRBC 0.0 0.0 - 0.2 %  Brain natriuretic peptide     Status: None   Collection Time: 02/14/20  1:59 PM  Result Value Ref Range   B Natriuretic Peptide 71.2 0.0 - 100.0 pg/mL  Blood gas, venous     Status: Abnormal (Preliminary result)   Collection Time: 02/14/20  3:35 PM  Result Value Ref Range   pH, Ven 7.32 7.250 - 7.430   pCO2, Ven 57 44.0 - 60.0 mmHg   pO2, Ven PENDING 32.0 - 45.0 mmHg   Bicarbonate 29.4 (H) 20.0 - 28.0 mmol/L   Acid-Base Excess 2.0 0.0 - 2.0 mmol/L   O2 Saturation 36.8 %   Patient temperature 37.0    Collection site VEIN    Sample type VENOUS   Troponin I (High Sensitivity)     Status: None   Collection Time: 02/14/20  3:35 PM  Result  Value Ref Range   Troponin I (High Sensitivity) 7 <18 ng/L  Troponin I (High Sensitivity)     Status: None   Collection Time: 02/14/20  5:32 PM  Result Value Ref Range   Troponin I (High Sensitivity) 7 <18 ng/L  Urinalysis, Complete w Microscopic     Status: Abnormal   Collection Time: 02/14/20  8:01 PM  Result Value Ref Range   Color, Urine YELLOW (A) YELLOW   APPearance HAZY (A) CLEAR   Specific Gravity, Urine 1.029 1.005 - 1.030   pH 5.0 5.0 - 8.0   Glucose, UA NEGATIVE NEGATIVE mg/dL   Hgb urine dipstick NEGATIVE NEGATIVE   Bilirubin Urine NEGATIVE NEGATIVE   Ketones, ur NEGATIVE NEGATIVE mg/dL   Protein, ur NEGATIVE NEGATIVE mg/dL   Nitrite NEGATIVE NEGATIVE   Leukocytes,Ua SMALL (A) NEGATIVE   RBC / HPF 6-10 0 - 5 RBC/hpf   WBC, UA 11-20 0 - 5 WBC/hpf   Bacteria, UA NONE SEEN NONE SEEN   Squamous Epithelial / LPF 0-5 0 - 5   Mucus PRESENT    Hyaline Casts, UA PRESENT   SARS Coronavirus 2 by RT PCR (hospital order, performed in [name] hospital lab) Nasopharyngeal Nasopharyngeal Swab     Status: None   Collection Time: 02/14/20  8:14 PM   Specimen: Nasopharyngeal Swab  Result Value Ref Range   SARS Coronavirus 2 NEGATIVE NEGATIVE   ____________________________________________  EKG My review and personal interpretation at Time: 13:55   Indication: weakness  Rate: 95  Rhythm: sinus Axis: normal Other: normal intervals, no stemi ____________________________________________  RADIOLOGY  I personally reviewed all radiographic images ordered to evaluate for the above acute complaints and reviewed radiology reports and findings.  These findings were personally discussed with the patient.  Please see medical record for radiology report.  ____________________________________________   PROCEDURES  Procedure(s) performed:  Procedures    Critical Care performed: no ____________________________________________   INITIAL IMPRESSION / ASSESSMENT AND PLAN / ED COURSE   Pertinent labs & imaging results that were available during my care of the patient were reviewed by me and considered in my medical decision making (see chart for details).   DDX: chf, copd, aki, electrolyte abn, uti, acs, overmedication  CASHAY MANGANELLI is a 74 y.o. who presents to the ED with symptoms as described above.  Very broad differential.  Will order imaging as well as blood work EKG and telemetry monitoring.  She is requiring oxygen.  I do suspect some component of polypharmacy.  Possible hypercapnia volume overload or electrolyte abnormality.  Clinical Course as of Feb 13 2317  Fri Feb 14, 2020  2014 Work-up thus far is fairly reassuring with no evidence of PE or other explanation for her increased O2 requirement.  Based on history work-up thus far suspect that she is being overmedicated with pain meds.  Does have some pain but I do think that this is worsening her condition.  Will discuss with hospitalist for observation with holding medication to see if symptoms clear.  Family member in agreement with plan.   [PR]    Clinical Course User Index [PR] Willy Eddy, MD    The patient was evaluated in Emergency Department today for the symptoms described in the history of present illness. He/she was evaluated in the context of the global COVID-19 pandemic, which necessitated consideration that the patient might be at risk for infection with the SARS-CoV-2 virus that causes COVID-19. Institutional protocols and algorithms that pertain to the evaluation of patients at risk for COVID-19 are in a state of rapid change based on information released by regulatory bodies including the CDC and federal and state organizations. These policies and algorithms were followed during the patient's care in the ED.  As part of my medical decision making, I reviewed the following data within the electronic MEDICAL RECORD NUMBER Nursing notes reviewed and incorporated, Labs reviewed, notes from prior ED  visits and [place] Controlled Substance Database   ____________________________________________   FINAL CLINICAL IMPRESSION(S) / ED DIAGNOSES  Final diagnoses:  Acute on chronic respiratory failure with hypoxia (HCC)      NEW MEDICATIONS STARTED DURING THIS VISIT:  Current Discharge Medication List       Note:  This document was prepared using Dragon voice recognition software and may include unintentional dictation errors.    Willy Eddy, MD 02/14/20 2318

## 2020-02-14 NOTE — Assessment & Plan Note (Signed)
S/p kyphoplasty with Dr Franky Macho and now struggling with profound weakness/ also inability to walk or stand due to pain  Needs hosp a/p for pain control  Family cannot care for her at home or get her back into the car  Called ambulance for transport to the hospital

## 2020-02-14 NOTE — ED Notes (Signed)
Pt taken to xray. Will draw blood and do EKG when pt returns.

## 2020-02-14 NOTE — Patient Instructions (Signed)
I think it is best given profound weakness to get you transported to the hospital  Then you can be evaluated for pain control/ breathing/fluid status and treat you appropriately

## 2020-02-14 NOTE — Progress Notes (Signed)
Subjective:    Patient ID: [name] [name], female    DOB: 26-Aug-1946, 74 y.o.   MRN: [id number]  This visit occurred during the SARS-CoV-2 public health emergency.  Safety protocols were in place, including screening questions prior to the visit, additional usage of staff PPE, and extensive cleaning of exam room while observing appropriate contact time as indicated for disinfecting solutions.    HPI 74 yo pt of Dr Selena Batten presents with back pain (s/p kyphopoasty), weakness and low pulse ox   Wt Readings from Last 3 Encounters:  01/29/20 176 lb (79.8 kg)  01/27/20 181 lb (82.1 kg)  01/07/20 183 lb 4 oz (83.1 kg)   35.55 kg/m    Caregivers note she has baseline cold hands that often do cause trouble getting an accurate pulse ox  Has baseline sob due to copd and CHF  (on 02 for chronic resp failure)  Back surgery was 5/5 with Dr Franky Macho   Pain control  Gabapentin norco-refilled 4/1 30 from [facility]  Tramadol -refilled 4/13 for 30 tabs Dr Reece Agar (out of )  Oxycodone -refilled   On 5/11 5 mg for 36 pills (from Dr Franky Macho) -still has some   She thinks her pain is well controlled Family disagrees  Not moving a lot due to her pain and movement causes spasms in the back   Family is unable to care for her lately    Pulse ox was initially 87-89% on RA Put on 2L of 02 here and pulse ox is 94% At home- for years much trouble getting an accurate pulse ox    Family thinks she may be dehydrated  Less urine output  She likes water and green tea  1 cup of coffee in the am  Takes lasix and monitors weight and takes as needed   Quite weak- cannot stand on scale to get weights   Does not feel feverish  Has baseline chest congestion  Does not cough a lot   She has home health OT and PT  No nursing order however   Almost could not get her here (in or out of the car)  Cannot life her own legs Feels she needs an ambulance to get to the hospital    Patient Active Problem List     Diagnosis Date Noted  . Closed T10 fracture (HCC) 01/29/2020  . Acute midline back pain 12/21/2019  . Hearing loss due to cerumen impaction, left 07/08/2019  . Neuropathy 05/06/2019  . CKD (chronic kidney disease) stage 3, GFR 30-59 ml/min 01/24/2019  . Tinnitus of both ears 11/24/2018  . Vision loss of left eye 07/31/2018  . Thrombocytopenia (HCC) 07/31/2018  . Tremor 07/03/2018  . Weakness 05/07/2018  . Chronic diastolic heart failure (HCC) 01/30/2018  . Memory loss 01/30/2018  . Peripheral edema 01/30/2018  . Intertrigo 01/30/2018  . Chronic back pain 11/21/2017  . Chronic respiratory failure with hypoxia (HCC) 11/14/2017  . Shortness of breath 11/14/2017  . Closed compression fracture of fourth lumbar vertebra (HCC) 10/02/2017  . Compression fracture of body of thoracic vertebra (HCC) 09/30/2017  . Hypothyroidism (acquired) 06/28/2017  . Vitamin D deficiency 06/25/2017  . Advanced care planning/counseling discussion 01/29/2015  . Encounter for general adult medical examination with abnormal findings 01/29/2015  . Carotid stenosis 04/03/2014  . Carotid artery aneurysm (HCC) 04/03/2014  . Recurrent falls 01/02/2014  . Hemiparesis affecting right side as late effect of stroke (HCC) 01/02/2014  . Insomnia 01/10/2013  . Medicare annual wellness  visit, subsequent 11/26/2012  . Hypertension   . History of lacunar cerebrovascular accident (CVA)   . Peripheral vascular disease (HCC)   . COPD (chronic obstructive pulmonary disease) (HCC)   . Hyperlipidemia   . Osteoarthritis   . MDD (major depressive disorder), recurrent episode, moderate (HCC)   . Ex-smoker   . Osteoporosis 11/25/2010   Past Medical History:  Diagnosis Date  . (HFpEF) heart failure with preserved ejection fraction (HCC)    a. 11/2019 Echo: EF 60-65%, no rwma. Mild LVH. Mild AI, mild to mod AS.  [name]. Aneurysm (HCC)    She had 2.6 cm dilation of the infrarenal abdominal aorta on 08/01/15 CTA with 5 year US  recommended  . Anxiety   . Anxiety and depression   . Aortic stenosis    mild-moderate AS 12/23/19 echo  . Carotid stenosis    R 50% (12/2012)  . CHF (congestive heart failure) (HCC)   . Compression fracture of body of thoracic vertebra (HCC)    T 10  . Concussion 08/03/2015  . COPD (chronic obstructive pulmonary disease) (HCC) 12/2012   spirometry: Pre: FVC 84%, FEV1 69%, ratio 0.64 consistent with moderate obstruction.  . Depression   . Fall 08/03/2015   d/c home health 08/2015  . Fracture of cervical vertebra, C5 (HCC) 08/06/2015  . History of chicken pox   . Hyperlipidemia   . Hypertension   . Hypothyroidism   . Lower back pain    h/o HNP s/p surgery  . Neuropathy    B/L feet  . On supplemental oxygen by nasal cannula    at HS and PRN during the day  . Osteoarthritis    h/o ruptured disc s/p ESI  . Osteoporosis 11/2010   DEXA -2.7 spine, thoracic compression fracture  . Peripheral vascular disease (HCC)   . Smoker    quit 10/2012  . Stroke [facility](HCC) 2010   x3 with residual R hemiparesis, s/p R MCA balloon angioplasty (2010)  . Wears dentures    upper   Past Surgical History:  Procedure Laterality Date  . APPENDECTOMY  1960  . CATARACT EXTRACTION     bilateral  . CESAREAN SECTION    . CHOLECYSTECTOMY  1970  . COLONOSCOPY  2004   diverticulosis, no polyps Jarold Motto([place])  . COLONOSCOPY  05/2016   decreased sphincter tone, diverticulosis, no f/u recommended ([place])  . DEXA  11/2010   T -2.7 spine, -1.9 hip  . HIP SURGERY Left 2006   fractured - screws placed  . IR ANGIO INTRA EXTRACRAN SEL COM CAROTID INNOMINATE BILAT MOD SED  08/01/2018  . IR ANGIO INTRA EXTRACRAN SEL COM CAROTID INNOMINATE BILAT MOD SED  12/10/2018  . IR ANGIO VERTEBRAL SEL SUBCLAVIAN INNOMINATE UNI R MOD SED  08/01/2018  . IR ANGIO VERTEBRAL SEL SUBCLAVIAN INNOMINATE UNI R MOD SED  12/10/2018  . IR ANGIO VERTEBRAL SEL VERTEBRAL UNI L MOD SED  12/10/2018  . IR RADIOLOGIST EVAL & MGMT  01/09/2018  .  KYPHOPLASTY  10/02/2017   Procedure: LUMBAR FOUR KYPHOPLASTY;  Surgeon: Coletta Memosabbell, Kyle, MD;  [facility];  Service: Neurosurgery;;  . KYPHOPLASTY N/A 01/29/2020   Procedure: Thoracic Ten Kyphoplasty;  Surgeon: Coletta Memosabbell, Kyle, MD;  [facility];  Service: Neurosurgery;  Laterality: N/A;  . RADIOLOGY WITH ANESTHESIA N/A 11/11/2015   Procedure: RADIOLOGY WITH ANESTHESIA;  Surgeon: Julieanne CottonSanjeev Deveshwar, MD;  Location: [facility] OR;  Service: Radiology;  Laterality: N/A;  . RADIOLOGY WITH ANESTHESIA N/A 12/10/2018   Procedure: STENTING;  Surgeon: Julieanne Cotton, MD;  Location[facility];  Service: Radiology;  Laterality: N/A;   Social History   Tobacco Use  . Smoking status: Former Smoker    Packs/day: 2.00    Years: 46.00    Pack years: 92.00    Types: Cigarettes    Quit date: 11/08/2012    Years since quitting: 7.2  . Smokeless tobacco: Never Used  Substance Use Topics  . Alcohol use: No    Alcohol/week: 0.0 standard drinks  . Drug use: No   Family History  Problem Relation Age of Onset  . CAD Mother        MI  . Cancer Mother        cervical  . CAD Maternal Aunt        MI  . CAD Sister        MI  . Sudden death Father 12       died in his sleep, chain smoker  . Cirrhosis Father   . Hypertension Daughter   . Diabetes Maternal Aunt   . Cancer Maternal Grandmother        cervical   Allergies  Allergen Reactions  . Doxycycline Nausea Only and Other (See Comments)    Sig GI upset  . Fluticasone-Salmeterol Anxiety   Current Outpatient Medications on File Prior to Visit  Medication Sig Dispense Refill  . acetaminophen (TYLENOL) 500 MG tablet Take 1 tablet (500 mg total) by mouth every 6 (six) hours as needed for headache.    . albuterol (VENTOLIN HFA) 108 (90 Base) MCG/ACT inhaler INHALE 2 PUFFS INTO THE LUNGS EVERY 6 (SIX) HOURS AS NEEDED FOR WHEEZING OR SHORTNESS OF BREATH. 18 g 0  . alendronate (FOSAMAX) 70 MG tablet TAKE 1 TABLET WEEKLY. TAKE WITH A FULL GLASS OF WATER ON AN EMPTY  STOMACH. (Patient taking differently: Take 70 mg by mouth every Tuesday. TAKE WITH A FULL GLASS OF WATER ON AN EMPTY STOMACH.) 12 tablet 2  . aspirin (ASPIRIN 81) 81 MG EC tablet Take 1 tablet (81 mg total) by mouth daily. Swallow whole.    [name] atorvastatin (LIPITOR) 40 MG tablet TAKE 1 TABLET EVERY DAY 90 tablet 1  . Cholecalciferol (VITAMIN D3) 125 MCG (5000 UT) CAPS Take 5,000 Units by mouth daily. Gummies    . clopidogrel (PLAVIX) 75 MG tablet TAKE 1 TABLET (75 MG TOTAL) BY MOUTH DAILY. 90 tablet 1  . Cranberry-Vitamin C (AZO CRANBERRY URINARY TRACT PO) Take 1 tablet by mouth in the morning and at bedtime.     . diazepam (VALIUM) 5 MG tablet Take 5 mg by mouth every 8 (eight) hours as needed for muscle spasms. Per surgeon 02/05/20    . furosemide (LASIX) 40 MG tablet Take 20 mg by mouth daily as needed for fluid. Weight every morning before given    . gabapentin (NEURONTIN) 100 MG capsule TAKE 1 CAPSULE (100 MG TOTAL) BY MOUTH AT BEDTIME. 90 capsule 1  . HYDROcodone-acetaminophen (NORCO/VICODIN) 5-325 MG tablet Take 1 tablet by mouth 3 (three) times daily as needed for moderate pain (If not controlled by tramadol). 15 tablet 0  . levothyroxine (SYNTHROID) 50 MCG tablet TAKE 1 TABLET  DAILY BEFORE BREAKFAST. 90 tablet 1  . Melatonin 5 MG CHEW Chew 5 mg by mouth daily. Gummies    . methocarbamol (ROBAXIN) 500 MG tablet Take 1 tablet (500 mg total) by mouth every 8 (eight) hours as needed for muscle spasms. 40 tablet 1  . Multiple Vitamins-Minerals (MULTIVITAMIN PO)  Take 1 tablet by mouth daily. Gummie    . ondansetron (ZOFRAN) 4 MG tablet Take 1 tablet (4 mg total) by mouth every 8 (eight) hours as needed for nausea or vomiting. 20 tablet 0  . oxycodone (OXY-IR) 5 MG capsule Take 5 mg by mouth every 6 (six) hours as needed. Per surgeon 02/05/20    . OXYGEN Inhale 2 L/min into the lungs See admin instructions. Inhale 2 L/min of oxygen at bedtime and during the day as needed for shortness of breath    .  pantoprazole (PROTONIX) 40 MG tablet Take 1 tablet (40 mg total) by mouth daily as needed. (Patient taking differently: Take 40 mg by mouth daily as needed (reflux). ) 90 tablet 1  . Potassium Chloride ER 20 MEQ TBCR TAKE 2 TABLETS BY MOUTH AS NEEDED (WHENEVER  YOU  TAKE  LASIX) (Patient taking differently: Take 20 mEq by mouth daily as needed (Only when takes Lasix). ) 180 tablet 2  . sertraline (ZOLOFT) 100 MG tablet TAKE 1 TABLET EVERY DAY 90 tablet 1  . sulfamethoxazole-trimethoprim (BACTRIM) 400-80 MG tablet Take 0.5 tablets by mouth daily.    . traMADol (ULTRAM) 50 MG tablet Take 1-2 tablets (50-100 mg total) by mouth 2 (two) times daily as needed for moderate pain. 30 tablet 0  . traZODone (DESYREL) 100 MG tablet Take 1.5 tablets (150 mg total) by mouth at bedtime. (Patient taking differently: Take 100 mg by mouth at bedtime. ) 135 tablet 1  . trimethoprim (TRIMPEX) 100 MG tablet Take 1 tablet (100 mg total) by mouth daily. Take daily to prevent UTI 90 tablet 1  . vitamin C (ASCORBIC ACID) 250 MG tablet Take 500 mg by mouth daily. Gummies    . Zinc 30 MG TABS Take 30 mg by mouth daily. Gummies     No current facility-administered medications on file prior to visit.    Review of Systems  Constitutional: Positive for activity change and fatigue. Negative for fever.  Respiratory: Positive for cough and shortness of breath. Negative for wheezing.        Baseline cough  More sob and unable to breathe well due so severe back pain   Cardiovascular: Negative for palpitations and leg swelling.  Gastrointestinal:       Suspects dehydrated based on less urine input and fatigue   Endocrine: Negative for polydipsia and polyuria.  Genitourinary: Positive for decreased urine volume. Negative for dysuria.  Musculoskeletal: Positive for arthralgias, back pain, gait problem and myalgias.  Skin: Negative for color change.  Neurological: Negative for dizziness and light-headedness.       Objective:    Physical Exam Constitutional:      Appearance: She is normal weight.     Comments: Frail appearing elderly female in back brace and wheelchair currently -unable to move much at all  HENT:     Head: Normocephalic and atraumatic.  Eyes:     Conjunctiva/sclera: Conjunctivae normal.     Pupils: Pupils are equal, round, and reactive to light.  Cardiovascular:     Rate and Rhythm: Tachycardia present.  Pulmonary:     Breath sounds: No wheezing or rales.     Comments: Splinting -cannot take deep breaths due to pain  Limited exam-no rales or crackles  Abdominal:     General: There is no distension.     Tenderness: There is no abdominal tenderness.  Musculoskeletal:     Cervical back: No rigidity.     Right lower leg: No edema.  Left lower leg: No edema.     Comments: Unable to move spine 2nd to pain   Skin:    General: Skin is warm and dry.     Comments: Bruising on arms  Sallow complexion  Fair skin turgor   Neurological:     Mental Status: She is alert.  Psychiatric:        Mood and Affect: Mood normal.           Assessment & Plan:   Problem List Items Addressed This Visit      Cardiovascular and Mediastinum   Chronic diastolic heart failure ([place])    Unable to weigh due to weakness No swelling and does not seem fluid overloaded  However -more hypoxic  Will need cxr /further eval  Sent for transport to hospital        Respiratory   Chronic respiratory failure with hypoxia ([place])    Worsened lately with low 02 at home more than usual  More sob s/p kyphoplasty recent and inability to take deep breaths due to splinting  Needs hospital eval /cxr and tx         Musculoskeletal and Integument   Closed T10 fracture ([place]) - Primary    S/p kyphoplasty with Dr Christella Noa and now struggling with profound weakness/ also inability to walk or stand due to pain  Needs hosp a/p for pain control  Family cannot care for her at home or get her back into the car  Called  ambulance for transport to the hospital        Other   Weakness    S/p recent kyphoplasty- unable to stand or ambulate due to pain /weakness and sob (all 3) Needs transport to the hospital

## 2020-02-14 NOTE — ED Notes (Signed)
Pt's daughter informed she can come back in the morning to visit her mother, pt's daughter asked to wait for before transferring pt to room as family is coming bringing pt's belongings from home

## 2020-02-14 NOTE — Assessment & Plan Note (Signed)
S/p recent kyphoplasty- unable to stand or ambulate due to pain /weakness and sob (all 3) Needs transport to the hospital

## 2020-02-14 NOTE — Assessment & Plan Note (Signed)
Worsened lately with low 02 at home more than usual  More sob s/p kyphoplasty recent and inability to take deep breaths due to splinting  Needs hospital eval /cxr and tx

## 2020-02-14 NOTE — Assessment & Plan Note (Signed)
Unable to weigh due to weakness No swelling and does not seem fluid overloaded  However -more hypoxic  Will need cxr /further eval  Sent for transport to hospital

## 2020-02-14 NOTE — ED Notes (Signed)
Provided pt with a [place] sandwich tray, and a ginger ale, pt's daughter assisting pt with her meal. No other needs at this time

## 2020-02-14 NOTE — ED Notes (Signed)
Pt to CT at this time.

## 2020-02-14 NOTE — Telephone Encounter (Signed)
Noted multiple falls

## 2020-02-14 NOTE — Telephone Encounter (Signed)
Thank you. Patient had new vertebral compression fractures s/p kyphoplasty by Dr Franky Macho 5/5. May also need increased pain regimen if poor control despite tramadol.

## 2020-02-14 NOTE — ED Notes (Signed)
Pt urinated on bedpan, UA collected by this RN and sent to lab.

## 2020-02-14 NOTE — Progress Notes (Signed)
PHARMACIST - PHYSICIAN COMMUNICATION  CONCERNING: P&T Medication Policy Regarding Oral Bisphosphonates  RECOMMENDATION: Your order for alendronate (Fosamax), ibandronate (Boniva), or risedronate (Actonel) has been discontinued at this time.  If the patient's post-hospital medical condition warrants safe use of this class of drugs, please resume the pre-hospital regimen upon discharge.  DESCRIPTION:  Alendronate (Fosamax), ibandronate (Boniva), and risedronate (Actonel) can cause severe esophageal erosions in patients who are unable to remain upright at least 30 minutes after taking this medication.   Since brief interruptions in therapy are thought to have minimal impact on bone mineral density, the Pharmacy & Therapeutics Committee has established that bisphosphonate orders should be routinely discontinued during hospitalization.   To override this safety policy and permit administration of Boniva, Fosamax, or Actonel in the hospital, prescribers must write "DO NOT HOLD" in the comments section when placing the order for this class of medications.   Bettey Costa, PharmD Clinical Pharmacist 02/14/2020 11:14 PM

## 2020-02-15 DIAGNOSIS — J9621 Acute and chronic respiratory failure with hypoxia: Secondary | ICD-10-CM | POA: Diagnosis present

## 2020-02-15 DIAGNOSIS — J432 Centrilobular emphysema: Secondary | ICD-10-CM | POA: Diagnosis not present

## 2020-02-15 DIAGNOSIS — Z881 Allergy status to other antibiotic agents status: Secondary | ICD-10-CM | POA: Diagnosis not present

## 2020-02-15 DIAGNOSIS — E785 Hyperlipidemia, unspecified: Secondary | ICD-10-CM | POA: Diagnosis present

## 2020-02-15 DIAGNOSIS — S22000A Wedge compression fracture of unspecified thoracic vertebra, initial encounter for closed fracture: Secondary | ICD-10-CM | POA: Diagnosis not present

## 2020-02-15 DIAGNOSIS — Z9889 Other specified postprocedural states: Secondary | ICD-10-CM | POA: Diagnosis not present

## 2020-02-15 DIAGNOSIS — X58XXXD Exposure to other specified factors, subsequent encounter: Secondary | ICD-10-CM | POA: Diagnosis present

## 2020-02-15 DIAGNOSIS — E039 Hypothyroidism, unspecified: Secondary | ICD-10-CM | POA: Diagnosis present

## 2020-02-15 DIAGNOSIS — I739 Peripheral vascular disease, unspecified: Secondary | ICD-10-CM | POA: Diagnosis present

## 2020-02-15 DIAGNOSIS — F419 Anxiety disorder, unspecified: Secondary | ICD-10-CM | POA: Diagnosis present

## 2020-02-15 DIAGNOSIS — Z6836 Body mass index (BMI) 36.0-36.9, adult: Secondary | ICD-10-CM | POA: Diagnosis not present

## 2020-02-15 DIAGNOSIS — J449 Chronic obstructive pulmonary disease, unspecified: Secondary | ICD-10-CM | POA: Diagnosis present

## 2020-02-15 DIAGNOSIS — I700 Atherosclerosis of aorta: Secondary | ICD-10-CM | POA: Diagnosis present

## 2020-02-15 DIAGNOSIS — Z888 Allergy status to other drugs, medicaments and biological substances status: Secondary | ICD-10-CM | POA: Diagnosis not present

## 2020-02-15 DIAGNOSIS — K219 Gastro-esophageal reflux disease without esophagitis: Secondary | ICD-10-CM | POA: Diagnosis present

## 2020-02-15 DIAGNOSIS — I69051 Hemiplegia and hemiparesis following nontraumatic subarachnoid hemorrhage affecting right dominant side: Secondary | ICD-10-CM | POA: Diagnosis not present

## 2020-02-15 DIAGNOSIS — E669 Obesity, unspecified: Secondary | ICD-10-CM | POA: Diagnosis present

## 2020-02-15 DIAGNOSIS — Z87440 Personal history of urinary (tract) infections: Secondary | ICD-10-CM | POA: Diagnosis not present

## 2020-02-15 DIAGNOSIS — I959 Hypotension, unspecified: Secondary | ICD-10-CM | POA: Diagnosis present

## 2020-02-15 DIAGNOSIS — I5032 Chronic diastolic (congestive) heart failure: Secondary | ICD-10-CM | POA: Diagnosis present

## 2020-02-15 DIAGNOSIS — T4275XA Adverse effect of unspecified antiepileptic and sedative-hypnotic drugs, initial encounter: Secondary | ICD-10-CM | POA: Diagnosis present

## 2020-02-15 DIAGNOSIS — M4854XD Collapsed vertebra, not elsewhere classified, thoracic region, subsequent encounter for fracture with routine healing: Secondary | ICD-10-CM | POA: Diagnosis present

## 2020-02-15 DIAGNOSIS — M62838 Other muscle spasm: Secondary | ICD-10-CM | POA: Diagnosis present

## 2020-02-15 DIAGNOSIS — F329 Major depressive disorder, single episode, unspecified: Secondary | ICD-10-CM | POA: Diagnosis present

## 2020-02-15 DIAGNOSIS — M549 Dorsalgia, unspecified: Secondary | ICD-10-CM | POA: Diagnosis present

## 2020-02-15 DIAGNOSIS — G4700 Insomnia, unspecified: Secondary | ICD-10-CM | POA: Diagnosis present

## 2020-02-15 DIAGNOSIS — Y92009 Unspecified place in unspecified non-institutional (private) residence as the place of occurrence of the external cause: Secondary | ICD-10-CM | POA: Diagnosis not present

## 2020-02-15 DIAGNOSIS — Z20822 Contact with and (suspected) exposure to covid-19: Secondary | ICD-10-CM | POA: Diagnosis present

## 2020-02-15 LAB — CBC
HCT: 35 % — ABNORMAL LOW (ref 36.0–46.0)
Hemoglobin: 11.1 g/dL — ABNORMAL LOW (ref 12.0–15.0)
MCH: 29.4 pg (ref 26.0–34.0)
MCHC: 31.7 g/dL (ref 30.0–36.0)
MCV: 92.6 fL (ref 80.0–100.0)
Platelets: 169 10*3/uL (ref 150–400)
RBC: 3.78 MIL/uL — ABNORMAL LOW (ref 3.87–5.11)
RDW: 13.2 % (ref 11.5–15.5)
WBC: 6.5 10*3/uL (ref 4.0–10.5)
nRBC: 0 % (ref 0.0–0.2)

## 2020-02-15 LAB — BASIC METABOLIC PANEL
Anion gap: 6 (ref 5–15)
BUN: 18 mg/dL (ref 8–23)
CO2: 27 mmol/L (ref 22–32)
Calcium: 8.7 mg/dL — ABNORMAL LOW (ref 8.9–10.3)
Chloride: 106 mmol/L (ref 98–111)
Creatinine, Ser: 1.01 mg/dL — ABNORMAL HIGH (ref 0.44–1.00)
GFR calc Af Amer: >60 mL/min (ref >60)
GFR calc non Af Amer: 55 mL/min — ABNORMAL LOW (ref >60)
Glucose, Bld: 176 mg/dL — ABNORMAL HIGH (ref 70–99)
Potassium: 4.5 mmol/L (ref 3.5–5.1)
Sodium: 139 mmol/L (ref 135–145)

## 2020-02-15 LAB — TSH: TSH: 4.188 u[IU]/mL (ref 0.350–4.500)

## 2020-02-15 MED ORDER — METHOCARBAMOL 500 MG PO TABS
500.0000 mg | ORAL_TABLET | Freq: Three times a day (TID) | ORAL | Status: DC | PRN
  Administered 2020-02-17: 500 mg via ORAL
  Filled 2020-02-15: qty 1

## 2020-02-15 MED ORDER — PREDNISONE 10 MG PO TABS: 10.0000 mg | ORAL_TABLET | Freq: Every day | ORAL | Status: DC

## 2020-02-15 MED ORDER — TRAMADOL HCL 50 MG PO TABS
50.0000 mg | ORAL_TABLET | Freq: Two times a day (BID) | ORAL | Status: DC | PRN
  Administered 2020-02-17 – 2020-02-18 (×2): 50 mg via ORAL
  Administered 2020-02-19 – 2020-02-20 (×2): 100 mg via ORAL
  Filled 2020-02-15: qty 2
  Filled 2020-02-15 (×2): qty 1
  Filled 2020-02-15 (×2): qty 2

## 2020-02-15 MED ORDER — IPRATROPIUM-ALBUTEROL 0.5-2.5 (3) MG/3ML IN SOLN: 3.0000 mL | RESPIRATORY_TRACT | Status: DC | PRN

## 2020-02-15 MED ORDER — GABAPENTIN 100 MG PO CAPS
100.0000 mg | ORAL_CAPSULE | Freq: Every day | ORAL | Status: DC
  Administered 2020-02-15 – 2020-02-19 (×5): 100 mg via ORAL
  Filled 2020-02-15 (×5): qty 1

## 2020-02-15 MED ORDER — ALBUTEROL SULFATE (2.5 MG/3ML) 0.083% IN NEBU: 2.5000 mg | INHALATION_SOLUTION | Freq: Four times a day (QID) | RESPIRATORY_TRACT | Status: DC | PRN

## 2020-02-15 NOTE — Plan of Care (Signed)
  Problem: Education: Goal: Knowledge of General Education information will improve Description: Including pain rating scale, medication(s)/side effects and non-pharmacologic comfort measures Outcome: Not Progressing   Problem: Health Behavior/Discharge Planning: Goal: Ability to manage health-related needs will improve Outcome: Not Progressing   Problem: Clinical Measurements: Goal: Ability to maintain clinical measurements within normal limits will improve Outcome: Not Progressing Goal: Will remain free from infection Outcome: Not Progressing Goal: Diagnostic test results will improve Outcome: Not Progressing Goal: Respiratory complications will improve Outcome: Not Progressing Goal: Cardiovascular complication will be avoided Outcome: Not Progressing   Problem: Activity: Goal: Risk for activity intolerance will decrease Outcome: Not Progressing   Problem: Nutrition: Goal: Adequate nutrition will be maintained Outcome: Not Progressing   Problem: Coping: Goal: Level of anxiety will decrease Outcome: Not Progressing   Problem: Elimination: Goal: Will not experience complications related to bowel motility Outcome: Not Progressing Goal: Will not experience complications related to urinary retention Outcome: Not Progressing   Problem: Safety: Goal: Ability to remain free from injury will improve Outcome: Not Progressing   Problem: Skin Integrity: Goal: Risk for impaired skin integrity will decrease Outcome: Not Progressing   Problem: Pain Managment: Goal: General experience of comfort will improve Outcome: Not Progressing   

## 2020-02-15 NOTE — Progress Notes (Signed)
PROGRESS NOTE    [name] [name]   GUY:[id number]  DOB: Sep 30, 1945  PCP: [name], MD    DOA: 02/14/2020 LOS: 0   Brief Narrative   [name] [name]  is a 74 y.o. Caucasian female with a medical history of COPD (uses 2 L/min nocturnal oxygen at baseline), hypertension, dyslipidemia, hypothyroidism, CHF and peripheral vascular disease, who presented to the ED on 02/14/20 with generalized weakness, worsening dyspnea and increased home oxygen requirements since her recent kyphoplasty.  Also with reports of wheezing.  She has been needing 2-3 L/min continuous oxygen since the procedure.   She has been taking tramadol, oxycodone and Valium together with trazodone and melatonin at night.  Painkillers and muscle relaxants were given after her kyphoplasty.  Patient's daughter reports that some of these medications are "too much" for the patient.  In the ED, vitals initially stable, but later became hypotensive at 85/51.  Oxygen saturation initially 89-91%, improved to 100% on 2 L/min oxygen.  Chest xray showed low lung volumes and bibasilar atelectasis.  CTA chest ruled out pulmonary embolism, but also showed bibasilar atelectasis and a new pulmonary nodule thought to be post-inflammatory, and changes related to her T9 kyphoplasty.  Patient was treated for pain and given Duoneb in the ED.  Admitted to hospitalist service for further management.   Daughter reports patient has become profoundly weak overall since the procedure, is now requiring much more assistance than at her baseline.  PT evaluation pending.     Assessment & Plan   Principal Problem:   Acute on chronic respiratory failure with hypoxia (HCC) Active Problems:   COPD (chronic obstructive pulmonary disease) (HCC)   Compression fracture of body of thoracic vertebra (HCC)   Hypertension   Peripheral vascular disease (HCC)   Chronic back pain   Hyperlipidemia   Insomnia   Hypothyroidism (acquired)   Acute on chronic  respiratory failure with hypoxia - multifactorial, due to shallow inspirations due to back pain s/p fracture and kyphoplasty, sedating medications and possibly mild COPD exacerbations.  Baseline O2 need is 2 L/min at night.  Requiring 3 L/min continuous. --supplemental O2 as needed, maintain O2 sat > 90% --incentive spirometer, encourage frequent use when pain controlled --IV Toradol PRN to avoid sedation --d/c Rocephin (no evidence of infection) --holding Norco and Oxy --resume Tramadol PRN --resume Robaxin for muscle spasms --monitor for over-sedation  COPD - with mild exacerbation present on admission, as above.  Appears to use only albulterol HFA PRN at home.   --transition to Prednisone tomorrow --Duonebs PRN --Mucinex BID --home Albuterol inhaler  Compression fracture of body of thoracic vertebra - recent T9 kyphoplasty.  Continues to have pain and muscle spasms that limit deep inspirations.    Hypotension - present on admission, resolved.  Stop IV fluids.  History of Hypertension / Edema - diuretics are held for now.   Peripheral vascular disease - continue ASA and Plavix.  Chronic back pain - currently with increased pain with fracture.  Mgmt as above.    Hyperlipidemia - continue atorvastatin  Depression/Anxiety/Insomnia - continue Zoloft, trazodone.  Hold Valium.  Hypothyroidism (acquired) - continue Synthroid.  GERD - continue PPI   Obesity: Body mass index is 36.51 kg/m.   Complicates overall care and prognosis.  DVT prophylaxis: Lovenox  Diet:  Diet Orders (From admission, onward)    Start     Ordered   02/14/20 2053  Diet Heart Room service appropriate? Yes; Fluid consistency: Thin  Diet effective now  Question Answer Comment  Room service appropriate? Yes   Fluid consistency: Thin      02/14/20 2055            Code Status: Full Code    Subjective 02/15/20    Patient seen with daughter at bedside this AM.  No acute events reported.  Patient  says she feels okay.  Reports being very weak recently, and daughter confirms patient needs significantly more help that usual and progressive weaker since kyphoplasty.  Concerned for her ability to care for her at home.  Patient reports most back pain due to spasms.  No other acute complaints.    Disposition Plan & Communication   Status is: Inpatient  Remains inpatient appropriate because:Unsafe d/c plan.  Therapy recommending SNF for rehab, patient is too weak to safely return home at this time.  SNF placement in progress.   Dispo: The patient is from: Home              Anticipated d/c is to: SNF              Anticipated d/c date is: 2 days              Patient currently is medically stable to d/c.   Family Communication: daughter at bedside during encounter, updated and all questions answered.    Consults, Procedures, Significant Events   Consultants:   none  Procedures:   none  Antimicrobials:   Rocephin 5/21>>5/22    Objective   Vitals:   02/15/20 0448 02/15/20 0928 02/15/20 1200 02/15/20 1209  BP: (!) 119/59 105/60  120/65  Pulse: 89 96 (!) 101 100  Resp: 20  16 16   Temp: 98 F (36.7 C) 98.5 F (36.9 C)  98.6 F (37 C)  TempSrc: Oral Oral  Oral  SpO2: 95% 93% 97% 95%  Weight:      Height:        Intake/Output Summary (Last 24 hours) at 02/15/2020 1410 Last data filed at 02/15/2020 0158 Gross per 24 hour  Intake 0 ml  Output --  Net 0 ml   Filed Weights   02/14/20 1351 02/15/20 0100  Weight: 77.1 kg 82 kg    Physical Exam:  General exam: awake, alert, no acute distress HEENT: moist mucus membranes, hearing grossly normal  Respiratory system: CTAB, no wheezes, rales or rhonchi, normal respiratory effort. Cardiovascular system: normal S1/S2, RRR, no JVD, murmurs, rubs, gallops, no pedal edema.   Gastrointestinal system: soft, NT, ND, no HSM felt, +bowel sounds. Central nervous system: A&O x3. no gross focal neurologic deficits, normal  speech Extremities: moves all, no cyanosis, normal tone Psychiatry: normal mood, congruent affect, judgement and insight appear normal  Labs   Data Reviewed: I have personally reviewed following labs and imaging studies  CBC: Recent Labs  Lab 02/14/20 1356 02/15/20 0419  WBC 10.5 6.5  HGB 12.2 11.1*  HCT 38.4 35.0*  MCV 93.7 92.6  PLT 208 169   Basic Metabolic Panel: Recent Labs  Lab 02/14/20 1356 02/15/20 0419  NA 139 139  K 4.6 4.5  CL 104 106  CO2 27 27  GLUCOSE 98 176*  BUN 18 18  CREATININE 1.00 1.01*  CALCIUM 8.7* 8.7*   GFR: Estimated Creatinine Clearance: 46 mL/min (A) (by C-G formula based on SCr of 1.01 mg/dL (H)). Liver Function Tests: No results for input(s): AST, ALT, ALKPHOS, BILITOT, PROT, ALBUMIN in the last 168 hours. No results for input(s): LIPASE, AMYLASE in the last  168 hours. No results for input(s): AMMONIA in the last 168 hours. Coagulation Profile: No results for input(s): INR, PROTIME in the last 168 hours. Cardiac Enzymes: No results for input(s): CKTOTAL, CKMB, CKMBINDEX, TROPONINI in the last 168 hours. BNP (last 3 results) Recent Labs    11/05/19 1438  PROBNP 32.0   HbA1C: No results for input(s): HGBA1C in the last 72 hours. CBG: No results for input(s): GLUCAP in the last 168 hours. Lipid Profile: No results for input(s): CHOL, HDL, LDLCALC, TRIG, CHOLHDL, LDLDIRECT in the last 72 hours. Thyroid Function Tests: Recent Labs    02/14/20 2326  TSH 4.188   Anemia Panel: No results for input(s): VITAMINB12, FOLATE, FERRITIN, TIBC, IRON, RETICCTPCT in the last 72 hours. Sepsis Labs: No results for input(s): PROCALCITON, LATICACIDVEN in the last 168 hours.  Recent Results (from the past 240 hour(s))  SARS Coronavirus 2 by RT PCR (hospital order, performed in [facility] hospital lab) Nasopharyngeal Nasopharyngeal Swab     Status: None   Collection Time: 02/14/20  8:14 PM   Specimen: Nasopharyngeal Swab  Result Value Ref  Range Status   SARS Coronavirus 2 NEGATIVE NEGATIVE Final    Comment: (NOTE) SARS-CoV-2 target nucleic acids are NOT DETECTED. The SARS-CoV-2 RNA is generally detectable in upper and lower respiratory specimens during the acute phase of infection. The lowest concentration of SARS-CoV-2 viral copies this assay can detect is 250 copies / mL. A negative result does not preclude SARS-CoV-2 infection and should not be used as the sole basis for treatment or other patient management decisions.  A negative result may occur with improper specimen collection / handling, submission of specimen other than nasopharyngeal swab, presence of viral mutation(s) within the areas targeted by this assay, and inadequate number of viral copies (<250 copies / mL). A negative result must be combined with clinical observations, patient history, and epidemiological information. Fact Sheet for Patients:   StrictlyIdeas.no Fact Sheet for Healthcare Providers: BankingDealers.co.za This test is not yet approved or cleared  by the [place] FDA and has been authorized for detection and/or diagnosis of SARS-CoV-2 by FDA under an Emergency Use Authorization (EUA).  This EUA will remain in effect (meaning this test can be used) for the duration of the COVID-19 declaration under Section 564(b)(1) of the Act, 21 U.S.C. section 360bbb-3(b)(1), unless the authorization is terminated or revoked sooner. Performed at [facility], [address], [place], [place] [address]       Imaging Studies   DG Chest 2 View  Result Date: 02/14/2020 CLINICAL DATA:  Weakness and shortness of breath. Evaluate for infiltrate/edema. EXAM: CHEST - 2 VIEW COMPARISON:  12/22/2019 FINDINGS: Shallow lung inflation. Heart is enlarged. There are is atelectasis in both lung bases. Less likely, the findings could be related to early infiltrate. Small LEFT pleural effusion. Remote rib  fractures. Remote vertebral augmentation. Surgical clips are present in the UPPER abdomen. IMPRESSION: 1. Shallow lung inflation. 2. Bibasilar atelectasis. Electronically Signed   By: Nolon Nations M.D.   On: 02/14/2020 16:14   CT Angio Chest PE W and/or Wo Contrast  Result Date: 02/14/2020 CLINICAL DATA:  Shortness of breath for 1 week EXAM: CT ANGIOGRAPHY CHEST WITH CONTRAST TECHNIQUE: Multidetector CT imaging of the chest was performed using the standard protocol during bolus administration of intravenous contrast. Multiplanar CT image reconstructions and MIPs were obtained to evaluate the vascular anatomy. CONTRAST:  22mL OMNIPAQUE IOHEXOL 350 MG/ML SOLN COMPARISON:  Chest x-ray from earlier in the same  day CT from 12/22/2019. FINDINGS: Cardiovascular: Thoracic aorta demonstrates atherosclerotic calcification without aneurysmal dilatation or dissection. Coronary calcifications are seen. No significant cardiac enlargement is noted. Pulmonary artery shows a normal branching pattern. No intraluminal filling defect to suggest pulmonary embolism is seen. Mediastinum/Nodes: Thoracic inlet is within normal limits. No hilar or mediastinal adenopathy is noted. The esophagus as visualized is within normal limits. Lungs/Pleura: Mild bibasilar atelectasis is seen. A focal 8 mm nodule is noted in the right lung base likely postinflammatory in nature is it was not seen on prior CT examination. The lungs are otherwise clear. No sizable effusion is seen. Upper Abdomen: Gallbladder has been surgically removed. No other focal abnormality is noted. Musculoskeletal: Degenerative changes of the thoracic spine are noted. Changes of prior vertebral augmentation are seen at T9 with evidence of a chronic compression deformity at T10. Review of the MIP images confirms the above findings. IMPRESSION: No evidence of pulmonary emboli. Mild bibasilar atelectatic changes. 8 mm nodule in the right lower lobe best seen on image number 48  of series 8. This is felt to be postinflammatory in nature as it was not seen on a recent CT from 2 months previous. Compression fractures at T9 and T10 with augmentation at T9. Aortic Atherosclerosis (ICD10-I70.0). Electronically Signed   By: Alcide Clever M.D.   On: 02/14/2020 19:51     Medications   Scheduled Meds: . vitamin C  500 mg Oral Daily  . aspirin EC  81 mg Oral Daily  . atorvastatin  40 mg Oral Daily  . cholecalciferol  5,000 Units Oral Daily  . clopidogrel  75 mg Oral Daily  . enoxaparin (LOVENOX) injection  40 mg Subcutaneous Q24H  . guaiFENesin  600 mg Oral BID  . levothyroxine  50 mcg Oral QAC breakfast  . methylPREDNISolone (SOLU-MEDROL) injection  40 mg Intravenous Q8H  . multivitamin with minerals  1 tablet Oral Daily  . pantoprazole  40 mg Oral Daily  . sertraline  100 mg Oral Daily  . sulfamethoxazole-trimethoprim  0.5 tablet Oral Daily  . trimethoprim  100 mg Oral Daily  . zinc sulfate  220 mg Oral Daily   Continuous Infusions:     LOS: 0 days    Time spent: 30 minutes    Pennie Banter, DO [facility]  02/15/2020, 2:10 PM    If 7PM-7AM, please contact night-coverage. How to contact the [facility] Attending or Consulting provider 7A - 7P or covering provider during after hours 7P -7A, for this patient?    1. Check the care team in [facility] and look for a) attending/consulting [facility] provider listed and b) the [facility] team listed 2. Log into [email or web address] and use [place]'s universal password to access. If you do not have the password, please contact the hospital operator. 3. Locate the [facility] provider you are looking for under [facility] and page to a number that you can be directly reached. 4. If you still have difficulty reaching the provider, please page the [facility] (Director on Call) for the Hospitalists listed on amion for assistance.

## 2020-02-15 NOTE — Hospital Course (Addendum)
[name] [name]  is a 74 y.o. Caucasian female with a medical history of COPD (uses 2 L/min nocturnal oxygen at baseline), recurrent UTI's followed by urology (on prophylactic trimethoprim), hypertension, dyslipidemia, hypothyroidism, CHF, peripheral vascular disease, who presented to the ED on 02/14/20 with generalized weakness, worsening dyspnea and increased home oxygen requirements since her recent kyphoplasty for T10 compression fracture on 01/28/09.  Also with reports of mild wheezing on presentation.  She has been needing 2-3 L/min continuous oxygen since the procedure.   She has been taking tramadol, oxycodone and Valium together with trazodone and melatonin at night.  Painkillers and muscle relaxants were prescribed after her kyphoplasty.  Patient's daughter reports that some of these medications are "too much" for the patient.  In the ED, vitals initially stable, but later became hypotensive at 85/51.  Oxygen saturation initially 89-91%, improved to 100% on 2 L/min oxygen.  Chest xray showed low lung volumes and bibasilar atelectasis.  CTA chest ruled out pulmonary embolism, but also showed bibasilar atelectasis and a new pulmonary nodule thought to be post-inflammatory, and changes related to her T10 kyphoplasty.  Patient was treated for pain and given Duoneb in the ED.  Admitted to hospitalist service for further management.   Daughter reports patient has become profoundly weak overall since the procedure, is now requiring much more assistance than at her baseline.  PT and OT evaluated patient and recommend CIR.

## 2020-02-15 NOTE — Progress Notes (Signed)
Inpatient Rehab Admissions Coordinator Note:   Per PT recommendation, pt was screened for CIR candidacy by Wolfgang Phoenix, MS, CCC-SLP  At this time we are recommending Inpatient Rehab consult.  AC will place consult order per protocol  Please contact me with questions.   Wolfgang Phoenix, MS, CCC-SLP Admissions Coordinator [phone]

## 2020-02-15 NOTE — TOC Progression Note (Signed)
Transition of Care [facility]) - Progression Note    Patient Details  Name: [name] [name] MRN: [id number] Date of Birth: 01-30-1946  [facility] CM/SW Contact  Eilleen Kempf, LCSW Phone Number: 02/15/2020, 11:42 AM  Clinical Narrative:   5/22: CSW spoke with Pt, she is active with [employer] and already has services with [facility]. If there are changes/additions to [facility] or DME Pt requested we call her daughter [name] [phone] and she will assist with [employer] or [facility] needs.          Expected Discharge Plan and Services                                                 Social Determinants of Health (SDOH) Interventions    Readmission Risk Interventions Readmission Risk Prevention Plan 12/26/2019  Transportation Screening Complete  PCP or Specialist Appt within 5-7 Days Complete  Home Care Screening Complete  Medication Review (RN CM) Complete  Some recent data might be hidden

## 2020-02-15 NOTE — Progress Notes (Signed)
Inpatient Rehab Admissions:  Inpatient Rehab Consult received.  I called pt's room at [facility] for rehabilitation assessment and to discuss goals and expectations of an inpatient rehab admission.  Spoke with pt's daughter, Orianna Biskup.  Pt/daughter interested in CIR program.  Pt appears to be an appropriate CIR candidate.  Daughter acknowledged understanding and agreed with CIR goals and expectations.  Will begin insurance authorization for potential CIR admission.  Signed: Wolfgang Phoenix, MS, CCC-SLP Admissions Coordinator [phone]

## 2020-02-15 NOTE — Evaluation (Signed)
Physical Therapy Evaluation Patient Details Name: [name] [name] MRN: [id number] DOB: [birth year] Today's Date: 02/15/2020   History of Present Illness  74 yo female with onset of falls and SOB was admitted and noted hypotension and cleared for PE.  Has new findings of T10 vertebral fracture, but had a kyphoplasty on T9.  Noted susp UTI, low BP.  PMHx:  PN, osteoporosis, stroke, angioplasty R MCA, COPD, atherosclerosis, CHF, PVD, OA  Clinical Impression  Pt was seen for mobility on side of bed to walk in room, maintaining O2 sats but with elevated pulses.  Her plan is to see if CIR can accept her for strengthening given recent history of falls and injuries, and will have family support at home.  Pt is motivated, interested in getting an intervention to prevent further problems and return home safely.  Follow acutely for these needs as goals are written.    Follow Up Recommendations CIR    Equipment Recommendations  None recommended by PT    Recommendations for Other Services Rehab consult     Precautions / Restrictions Precautions Precautions: Fall;Back Precaution Booklet Issued: No Precaution Comments: reports her daughter cannot care for her due to falls Required Braces or Orthoses: (no spinal brace ordered, instructed back precautions) Restrictions Weight Bearing Restrictions: No       Mobility  Bed Mobility Overal bed mobility: Needs Assistance Bed Mobility: Sidelying to Sit   Sidelying to sit: Mod assist       General bed mobility comments: mod to assist trunk up from bed in safe body mechanics  Transfers Overall transfer level: Needs assistance Equipment used: Rolling walker (2 wheeled);1 person hand held assist Transfers: Sit to/from Stand Sit to Stand: Mod assist         General transfer comment: mod to assist to power up then min guard to steady in standing  Ambulation/Gait Ambulation/Gait assistance: Min assist Gait Distance (Feet): 18  Feet Assistive device: Rolling walker (2 wheeled);1 person hand held assist Gait Pattern/deviations: Step-through pattern;Decreased stride length;Wide base of support(pushes walker too far ahead) Gait velocity: reduced Gait velocity interpretation: <1.31 ft/sec, indicative of household ambulator General Gait Details: pt needs dense cues for use of RW, tends to set up to be too far ahead or to back up  Stairs            Wheelchair Mobility    Modified Rankin (Stroke Patients Only)       Balance Overall balance assessment: Needs assistance;History of Falls Sitting-balance support: Feet supported Sitting balance-Leahy Scale: Fair     Standing balance support: Bilateral upper extremity supported;During functional activity Standing balance-Leahy Scale: Poor Standing balance comment: requires support to stand upright and also cues for posture                             Pertinent Vitals/Pain Pain Assessment: No/denies pain    Home Living Family/patient expects to be discharged to:: Private residence Living Arrangements: Children;Other relatives Available Help at Discharge: Family;Available 24 hours/day Type of Home: House Home Access: Stairs to enter Entrance Stairs-Rails: Right Entrance Stairs-Number of Steps: 5 Home Layout: One level Home Equipment: Walker - 4 wheels;Cane - single point;Shower seat;Bedside commode      Prior Function Level of Independence: Needs assistance   Gait / Transfers Assistance Needed: has family help with gait due to recent falls  ADL's / Homemaking Assistance Needed: has shower bench for bathing with family help  Hand Dominance   Dominant Hand: Right    Extremity/Trunk Assessment   Upper Extremity Assessment Upper Extremity Assessment: Generalized weakness    Lower Extremity Assessment Lower Extremity Assessment: Generalized weakness    Cervical / Trunk Assessment Cervical / Trunk Assessment: Kyphotic   Communication   Communication: No difficulties  Cognition Arousal/Alertness: Awake/alert Behavior During Therapy: WFL for tasks assessed/performed Overall Cognitive Status: No family/caregiver present to determine baseline cognitive functioning                                 General Comments: pt is answering questions but unsure if she is accurate      General Comments General comments (skin integrity, edema, etc.): pt is up to walk with RW, using telemetry and monitoring vitals with 2L O2 on cannula.  Has 100% sats but pulses run as high as 120.    Exercises     Assessment/Plan    PT Assessment Patient needs continued PT services  PT Problem List Decreased strength;Decreased range of motion;Decreased activity tolerance;Decreased balance;Decreased mobility;Decreased coordination;Decreased safety awareness;Cardiopulmonary status limiting activity       PT Treatment Interventions DME instruction;Gait training;Stair training;Functional mobility training;Therapeutic activities;Therapeutic exercise;Balance training;Neuromuscular re-education;Patient/family education    PT Goals (Current goals can be found in the Care Plan section)  Acute Rehab PT Goals Patient Stated Goal: to stop falling PT Goal Formulation: With patient Time For Goal Achievement: 02/29/20 Potential to Achieve Goals: Good    Frequency 7X/week   Barriers to discharge Inaccessible home environment has stairs potentially but thinks she may go home wiht another family member    Co-evaluation               AM-PAC PT "6 Clicks" Mobility  Outcome Measure Help needed turning from your back to your side while in a flat bed without using bedrails?: A Lot Help needed moving from lying on your back to sitting on the side of a flat bed without using bedrails?: A Lot Help needed moving to and from a bed to a chair (including a wheelchair)?: A Lot Help needed standing up from a chair using your arms  (e.g., wheelchair or bedside chair)?: A Lot Help needed to walk in hospital room?: A Little Help needed climbing 3-5 steps with a railing? : Total 6 Click Score: 12    End of Session Equipment Utilized During Treatment: Gait belt;Oxygen Activity Tolerance: Patient tolerated treatment well;Patient limited by fatigue;Treatment limited secondary to medical complications (Comment) Patient left: in chair;with call bell/phone within reach;with chair alarm set Nurse Communication: Mobility status PT Visit Diagnosis: Unsteadiness on feet (R26.81);Muscle weakness (generalized) (M62.81);History of falling (Z91.81)    Time: 9470-9628 PT Time Calculation (min) (ACUTE ONLY): 25 min   Charges:   PT Evaluation $PT Eval Moderate Complexity: 1 Mod PT Treatments $Gait Training: 8-22 mins       Ramond Dial 02/15/2020, 2:42 PM  Mee Hives, PT MS Acute Rehab Dept. Number: [place] and [place]

## 2020-02-16 NOTE — TOC Progression Note (Signed)
Transition of Care [facility]) - Progression Note    Patient Details  Name: [name] [name] MRN: [id number] Date of Birth: 1946/02/11  Transition of Care [facility]) CM/SW Contact  Eilleen Kempf, [place] Phone Number: 02/16/2020, 12:06 PM  Clinical Narrative:    5/23: PT has recommended CIR for pt. Will likely d/c on Monday 5/24. CIR has received consult, determined Pt is appropriate and has started Auth. Contact for CIR: Wolfgang Phoenix, MS, CCC-SLP Admissions Coordinator [phone]          Expected Discharge Plan and Services                                                 Social Determinants of Health (SDOH) Interventions    Readmission Risk Interventions Readmission Risk Prevention Plan 12/26/2019  Transportation Screening Complete  PCP or Specialist Appt within 5-7 Days Complete  Home Care Screening Complete  Medication Review (RN CM) Complete  Some recent data might be hidden

## 2020-02-16 NOTE — Progress Notes (Signed)
PROGRESS NOTE    [name] [name]   SWF:[id number]  DOB: 06/10/1946  PCP: [name], MD    DOA: 02/14/2020 LOS: 1   Brief Narrative   [name] [name]  is a 74 y.o. Caucasian female with a medical history of COPD (uses 2 L/min nocturnal oxygen at baseline), hypertension, dyslipidemia, hypothyroidism, CHF and peripheral vascular disease, who presented to the ED on 02/14/20 with generalized weakness, worsening dyspnea and increased home oxygen requirements since her recent kyphoplasty for T10 compression fracture on 01/28/09.  Also with reports of mild wheezing on presentation.  She has been needing 2-3 L/min continuous oxygen since the procedure.   She has been taking tramadol, oxycodone and Valium together with trazodone and melatonin at night.  Painkillers and muscle relaxants were prescribed after her kyphoplasty.  Patient's daughter reports that some of these medications are "too much" for the patient.  In the ED, vitals initially stable, but later became hypotensive at 85/51.  Oxygen saturation initially 89-91%, improved to 100% on 2 L/min oxygen.  Chest xray showed low lung volumes and bibasilar atelectasis.  CTA chest ruled out pulmonary embolism, but also showed bibasilar atelectasis and a new pulmonary nodule thought to be post-inflammatory, and changes related to her T10 kyphoplasty.  Patient was treated for pain and given Duoneb in the ED.  Admitted to hospitalist service for further management.   Daughter reports patient has become profoundly weak overall since the procedure, is now requiring much more assistance than at her baseline.  PT and OT evaluated patient and recommend CIR.       Assessment & Plan   Principal Problem:   Acute on chronic respiratory failure with hypoxia (HCC) Active Problems:   COPD (chronic obstructive pulmonary disease) (HCC)   Compression fracture of body of thoracic vertebra (HCC)   Hypertension   Peripheral vascular disease (HCC)   Chronic  back pain   Hyperlipidemia   Insomnia   Hypothyroidism (acquired)   Acute on chronic respiratory failure with hypoxia - multifactorial, due to shallow inspirations due to back pain s/p fracture and kyphoplasty, sedating medications and possibly mild COPD exacerbation.  Baseline O2 need is 2 L/min at night.  Requiring 3 L/min continuous on presentation. --supplemental O2 as needed, maintain O2 sat > 90% --incentive spirometer, encourage frequent use when pain controlled --IV Toradol PRN to avoid sedation --resume Tramadol PRN --resume Robaxin for muscle spasms --holding Norco and Oxy --monitor for over-sedation  COPD - with mild exacerbation present on admission, as above.  Appears to use only albulterol HFA PRN at home.  Not wheezing and weaned off oxygen, will stop steroids. --monitor clinically off steroids --Duonebs PRN --Mucinex BID --home Albuterol inhaler  Compression fracture of body of thoracic vertebra - recent T9 kyphoplasty.  Continues to have pain and muscle spasms that limit deep inspirations.  Mgmt as above.  Hypotension - present on admission, resolved.  Off IV fluids.  History of Hypertension / Edema - diuretics are held for now.  Has lower DBP's would use diuretics with caution.   Peripheral vascular disease - continue ASA and Plavix.  Chronic back pain - currently with increased pain with fracture.  Mgmt as above.    Hyperlipidemia - continue atorvastatin  Depression/Anxiety/Insomnia - continue Zoloft, trazodone.  Hold Valium.  Hypothyroidism (acquired) - continue Synthroid.  GERD - continue PPI   Obesity: Body mass index is 36.51 kg/m.   Complicates overall care and prognosis.  DVT prophylaxis: Lovenox  Diet:  Diet Orders (From admission,  onward)    Start     Ordered   02/14/20 2053  Diet Heart Room service appropriate? Yes; Fluid consistency: Thin  Diet effective now    Question Answer Comment  Room service appropriate? Yes   Fluid consistency:  Thin      02/14/20 2055            Code Status: Full Code    Subjective 02/16/20    Patient seen with daughter at bedside this AM.  No acute events reported. Patient reports she's feeling better.  Not on oxygen and saturation 100% on room air at rest.  She says her back pain about 3-4 out of 10.  Had a BM this AM.   Disposition Plan & Communication   Status is: Inpatient  Remains inpatient appropriate because:Unsafe d/c plan.  Therapy recommending CIR for rehab, patient is too weak to safely return home at this time.  Rehab placement in progress.   Dispo: The patient is from: Home              Anticipated d/c is to: CIR              Anticipated d/c date is: 1 day if accepted and bed ready              Patient currently is medically stable for discharge.   Family Communication: daughter at bedside during encounter, updated and all questions answered.    Consults, Procedures, Significant Events   Consultants:   none  Procedures:   none  Antimicrobials:   Rocephin 5/21>>5/22    Objective   Vitals:   02/15/20 1209 02/15/20 1552 02/15/20 2133 02/16/20 0550  BP: 120/65 (!) 125/58 131/67 (!) 156/65  Pulse: 100 96 86 83  Resp: 16 14 16 16   Temp: 98.6 F (37 C) 98.5 F (36.9 C) 99 F (37.2 C) 98.2 F (36.8 C)  TempSrc: Oral Oral Oral Oral  SpO2: 95% 98% 94% 95%  Weight:      Height:        Intake/Output Summary (Last 24 hours) at 02/16/2020 0735 Last data filed at 02/16/2020 8299 Gross per 24 hour  Intake 318 ml  Output 600 ml  Net -282 ml   Filed Weights   02/14/20 1351 02/15/20 0100  Weight: 77.1 kg 82 kg    Physical Exam:  General exam: awake, alert, no acute distress HEENT: moist mucus membranes, hearing grossly normal  Respiratory system: CTAB, no wheezes, rales or rhonchi, normal respiratory effort. Cardiovascular system: normal S1/S2, RRR, trace pedal edema.   Central nervous system: A&O x3. no gross focal neurologic deficits, normal  speech Extremities: moves all, no cyanosis, normal tone Psychiatry: normal mood, congruent affect, judgement and insight appear normal  Labs   Data Reviewed: I have personally reviewed following labs and imaging studies  CBC: Recent Labs  Lab 02/14/20 1356 02/15/20 0419  WBC 10.5 6.5  HGB 12.2 11.1*  HCT 38.4 35.0*  MCV 93.7 92.6  PLT 208 371   Basic Metabolic Panel: Recent Labs  Lab 02/14/20 1356 02/15/20 0419  NA 139 139  K 4.6 4.5  CL 104 106  CO2 27 27  GLUCOSE 98 176*  BUN 18 18  CREATININE 1.00 1.01*  CALCIUM 8.7* 8.7*   GFR: Estimated Creatinine Clearance: 46 mL/min (A) (by C-G formula based on SCr of 1.01 mg/dL (H)). Liver Function Tests: No results for input(s): AST, ALT, ALKPHOS, BILITOT, PROT, ALBUMIN in the last 168 hours. No results for  input(s): LIPASE, AMYLASE in the last 168 hours. No results for input(s): AMMONIA in the last 168 hours. Coagulation Profile: No results for input(s): INR, PROTIME in the last 168 hours. Cardiac Enzymes: No results for input(s): CKTOTAL, CKMB, CKMBINDEX, TROPONINI in the last 168 hours. BNP (last 3 results) Recent Labs    11/05/19 1438  PROBNP 32.0   HbA1C: No results for input(s): HGBA1C in the last 72 hours. CBG: No results for input(s): GLUCAP in the last 168 hours. Lipid Profile: No results for input(s): CHOL, HDL, LDLCALC, TRIG, CHOLHDL, LDLDIRECT in the last 72 hours. Thyroid Function Tests: Recent Labs    02/14/20 2326  TSH 4.188   Anemia Panel: No results for input(s): VITAMINB12, FOLATE, FERRITIN, TIBC, IRON, RETICCTPCT in the last 72 hours. Sepsis Labs: No results for input(s): PROCALCITON, LATICACIDVEN in the last 168 hours.  Recent Results (from the past 240 hour(s))  SARS Coronavirus 2 by RT PCR (hospital order, performed in [facility] hospital lab) Nasopharyngeal Nasopharyngeal Swab     Status: None   Collection Time: 02/14/20  8:14 PM   Specimen: Nasopharyngeal Swab  Result Value Ref  Range Status   SARS Coronavirus 2 NEGATIVE NEGATIVE Final    Comment: (NOTE) SARS-CoV-2 target nucleic acids are NOT DETECTED. The SARS-CoV-2 RNA is generally detectable in upper and lower respiratory specimens during the acute phase of infection. The lowest concentration of SARS-CoV-2 viral copies this assay can detect is 250 copies / mL. A negative result does not preclude SARS-CoV-2 infection and should not be used as the sole basis for treatment or other patient management decisions.  A negative result may occur with improper specimen collection / handling, submission of specimen other than nasopharyngeal swab, presence of viral mutation(s) within the areas targeted by this assay, and inadequate number of viral copies (<250 copies / mL). A negative result must be combined with clinical observations, patient history, and epidemiological information. Fact Sheet for Patients:   BoilerBrush.com.cy Fact Sheet for Healthcare Providers: https://pope.com/ This test is not yet approved or cleared  by the [place] FDA and has been authorized for detection and/or diagnosis of SARS-CoV-2 by FDA under an Emergency Use Authorization (EUA).  This EUA will remain in effect (meaning this test can be used) for the duration of the COVID-19 declaration under Section 564(b)(1) of the Act, 21 U.S.C. section 360bbb-3(b)(1), unless the authorization is terminated or revoked sooner. Performed at [facility], [address] [place], [place] [address]       Imaging Studies   DG Chest 2 View  Result Date: 02/14/2020 CLINICAL DATA:  Weakness and shortness of breath. Evaluate for infiltrate/edema. EXAM: CHEST - 2 VIEW COMPARISON:  12/22/2019 FINDINGS: Shallow lung inflation. Heart is enlarged. There are is atelectasis in both lung bases. Less likely, the findings could be related to early infiltrate. Small LEFT pleural effusion. Remote rib  fractures. Remote vertebral augmentation. Surgical clips are present in the UPPER abdomen. IMPRESSION: 1. Shallow lung inflation. 2. Bibasilar atelectasis. Electronically Signed   By: Norva Pavlov M.D.   On: 02/14/2020 16:14   CT Angio Chest PE W and/or Wo Contrast  Result Date: 02/14/2020 CLINICAL DATA:  Shortness of breath for 1 week EXAM: CT ANGIOGRAPHY CHEST WITH CONTRAST TECHNIQUE: Multidetector CT imaging of the chest was performed using the standard protocol during bolus administration of intravenous contrast. Multiplanar CT image reconstructions and MIPs were obtained to evaluate the vascular anatomy. CONTRAST:  87mL OMNIPAQUE IOHEXOL 350 MG/ML SOLN COMPARISON:  Chest  x-ray from earlier in the same day CT from 12/22/2019. FINDINGS: Cardiovascular: Thoracic aorta demonstrates atherosclerotic calcification without aneurysmal dilatation or dissection. Coronary calcifications are seen. No significant cardiac enlargement is noted. Pulmonary artery shows a normal branching pattern. No intraluminal filling defect to suggest pulmonary embolism is seen. Mediastinum/Nodes: Thoracic inlet is within normal limits. No hilar or mediastinal adenopathy is noted. The esophagus as visualized is within normal limits. Lungs/Pleura: Mild bibasilar atelectasis is seen. A focal 8 mm nodule is noted in the right lung base likely postinflammatory in nature is it was not seen on prior CT examination. The lungs are otherwise clear. No sizable effusion is seen. Upper Abdomen: Gallbladder has been surgically removed. No other focal abnormality is noted. Musculoskeletal: Degenerative changes of the thoracic spine are noted. Changes of prior vertebral augmentation are seen at T9 with evidence of a chronic compression deformity at T10. Review of the MIP images confirms the above findings. IMPRESSION: No evidence of pulmonary emboli. Mild bibasilar atelectatic changes. 8 mm nodule in the right lower lobe best seen on image number 48  of series 8. This is felt to be postinflammatory in nature as it was not seen on a recent CT from 2 months previous. Compression fractures at T9 and T10 with augmentation at T9. Aortic Atherosclerosis (ICD10-I70.0). Electronically Signed   By: Alcide Clever M.D.   On: 02/14/2020 19:51     Medications   Scheduled Meds: . vitamin C  500 mg Oral Daily  . aspirin EC  81 mg Oral Daily  . atorvastatin  40 mg Oral Daily  . cholecalciferol  5,000 Units Oral Daily  . clopidogrel  75 mg Oral Daily  . enoxaparin (LOVENOX) injection  40 mg Subcutaneous Q24H  . gabapentin  100 mg Oral QHS  . guaiFENesin  600 mg Oral BID  . levothyroxine  50 mcg Oral QAC breakfast  . multivitamin with minerals  1 tablet Oral Daily  . pantoprazole  40 mg Oral Daily  . sertraline  100 mg Oral Daily  . sulfamethoxazole-trimethoprim  0.5 tablet Oral Daily  . trimethoprim  100 mg Oral Daily  . zinc sulfate  220 mg Oral Daily   Continuous Infusions:     LOS: 1 day    Time spent: 28 minutes    Pennie Banter, DO [facility]  02/16/2020, 7:35 AM    If 7PM-7AM, please contact night-coverage. How to contact the [facility] Attending or Consulting provider 7A - 7P or covering provider during after hours 7P -7A, for this patient?    1. Check the care team in [facility] and look for a) attending/consulting TRH provider listed and b) the [facility] team listed 2. Log into [email or web address] and use [place]'s universal password to access. If you do not have the password, please contact the hospital operator. 3. Locate the [facility] provider you are looking for under [facility] and page to a number that you can be directly reached. 4. If you still have difficulty reaching the provider, please page the [facility] (Director on Call) for the Hospitalists listed on amion for assistance.

## 2020-02-16 NOTE — Progress Notes (Signed)
Physical Therapy Treatment Patient Details Name: [name] [name] MRN: [id number] DOB: 06-22-1946 Today's Date: 02/16/2020    History of Present Illness 74 yo female with onset of falls and SOB was admitted and noted hypotension and cleared for PE.  Has new findings of T10 vertebral fracture, but had a kyphoplasty on T9.  Noted susp UTI, low BP.  PMHx:  PN, osteoporosis, stroke, angioplasty R MCA, COPD, atherosclerosis, CHF, PVD, OA    PT Comments    Pt was seen for mobility training, with cues for safety of spinal precautions on the bed.  Progressed to chair, then based on quality of gait took two walks for total of 26'.  Pt is a bit wobbly, has stable pulses and sats but is requiring assist with the walker to remain balanced.  Her ankles are stiff, strength is reduced on LE"s and balance is affected by both.  Follow acutely for these needs and plan for CIR progression if approved.   Follow Up Recommendations  CIR     Equipment Recommendations  None recommended by PT    Recommendations for Other Services Rehab consult     Precautions / Restrictions Precautions Precautions: Fall;Back Precaution Booklet Issued: No Precaution Comments: reviewed precautions Required Braces or Orthoses: (no brace ordered) Restrictions Weight Bearing Restrictions: No    Mobility  Bed Mobility Overal bed mobility: Needs Assistance Bed Mobility: Sidelying to Sit;Supine to Sit   Sidelying to sit: Min assist       General bed mobility comments: min to support trunk  Transfers Overall transfer level: Needs assistance Equipment used: Rolling walker (2 wheeled);1 person hand held assist Transfers: Sit to/from Stand Sit to Stand: Min assist         General transfer comment: min to power up and control initial standing  Ambulation/Gait Ambulation/Gait assistance: Min assist Gait Distance (Feet): 26 Feet(13 x 2) Assistive device: Rolling walker (2 wheeled);1 person hand held assist Gait  Pattern/deviations: Step-through pattern;Decreased stride length;Wide base of support;Trunk flexed(pushes walker ahead but corrects easily) Gait velocity: reduced Gait velocity interpretation: <1.31 ft/sec, indicative of household ambulator General Gait Details: minor physical and verbal prompts for gait   Stairs             Wheelchair Mobility    Modified Rankin (Stroke Patients Only)       Balance Overall balance assessment: Needs assistance;History of Falls Sitting-balance support: Feet supported Sitting balance-Leahy Scale: Good     Standing balance support: Bilateral upper extremity supported;During functional activity Standing balance-Leahy Scale: Fair Standing balance comment: less than fair dynamically                            Cognition Arousal/Alertness: Awake/alert Behavior During Therapy: WFL for tasks assessed/performed Overall Cognitive Status: Within Functional Limits for tasks assessed                                 General Comments: discussion about the home logistics      Exercises      General Comments General comments (skin integrity, edema, etc.): pulses were more controlled, sats were 95% with effort on room air      Pertinent Vitals/Pain Pain Assessment: Faces Faces Pain Scale: Hurts a little bit Pain Location: back Pain Descriptors / Indicators: Guarding Pain Intervention(s): Monitored during session;Repositioned    Home Living  Prior Function            PT Goals (current goals can now be found in the care plan section) Acute Rehab PT Goals Patient Stated Goal: hurt less Progress towards PT goals: Progressing toward goals    Frequency    7X/week      PT Plan Current plan remains appropriate    Co-evaluation              AM-PAC PT "6 Clicks" Mobility   Outcome Measure  Help needed turning from your back to your side while in a flat bed without using  bedrails?: A Little Help needed moving from lying on your back to sitting on the side of a flat bed without using bedrails?: A Little Help needed moving to and from a bed to a chair (including a wheelchair)?: A Little Help needed standing up from a chair using your arms (e.g., wheelchair or bedside chair)?: A Little Help needed to walk in hospital room?: A Little Help needed climbing 3-5 steps with a railing? : Total 6 Click Score: 16    End of Session Equipment Utilized During Treatment: Gait belt Activity Tolerance: Treatment limited secondary to medical complications (Comment);Patient limited by pain Patient left: in chair;with call bell/phone within reach;with chair alarm set;Other (comment)(with extra back support) Nurse Communication: Mobility status PT Visit Diagnosis: Unsteadiness on feet (R26.81);Muscle weakness (generalized) (M62.81);History of falling (Z91.81)     Time: 7989-2119 PT Time Calculation (min) (ACUTE ONLY): 39 min  Charges:  $Gait Training: 8-22 mins $Therapeutic Activity: 23-37 mins                   Ivar Drape 02/16/2020, 1:41 PM  Samul Dada, PT MS Acute Rehab Dept. Number: [facility] [id number] and [facility] [phone]

## 2020-02-17 NOTE — NC FL2 (Signed)
[place] LEVEL OF CARE SCREENING TOOL     IDENTIFICATION  Patient Name: [name] [name] Birthdate: [birth year] Sex: female Admission Date (Current Location): 02/14/2020  [place] and [place] Number:  Engineering geologist and Address:  [name], [address] [place], [place] [address]      Provider Number: [id number]  Attending Physician Name and Address:  Ezekiel Slocumb, DO  Relative Name and Phone Number:       Current Level of Care: Hospital Recommended Level of Care: [place] Prior Approval Number:    Date Approved/Denied:   PASRR Number: Manual review.  Discharge Plan: SNF    Current Diagnoses: Patient Active Problem List   Diagnosis Date Noted  . Acute on chronic respiratory failure with hypoxia ([place]) 02/15/2020  . Closed T10 fracture ([place]) 01/29/2020  . Acute midline back pain 12/21/2019  . Hearing loss due to cerumen impaction, left 07/08/2019  . Neuropathy 05/06/2019  . CKD (chronic kidney disease) stage 3, GFR 30-59 ml/min 01/24/2019  . Tinnitus of both ears 11/24/2018  . Vision loss of left eye 07/31/2018  . Thrombocytopenia ([place]) 07/31/2018  . Tremor 07/03/2018  . Weakness 05/07/2018  . Chronic diastolic heart failure ([place]) 01/30/2018  . Memory loss 01/30/2018  . Peripheral edema 01/30/2018  . Intertrigo 01/30/2018  . Chronic back pain 11/21/2017  . Chronic respiratory failure with hypoxia ([place]) 11/14/2017  . Shortness of breath 11/14/2017  . Closed compression fracture of fourth lumbar vertebra ([place]) 10/02/2017  . Compression fracture of body of thoracic vertebra ([place]) 09/30/2017  . Hypothyroidism (acquired) 06/28/2017  . Vitamin D deficiency 06/25/2017  . Advanced care planning/counseling discussion 01/29/2015  . Encounter for general adult medical examination with abnormal findings 01/29/2015  . Carotid stenosis 04/03/2014  . Carotid artery aneurysm ([place]) 04/03/2014  . Recurrent  falls 01/02/2014  . Hemiparesis affecting right side as late effect of stroke ([place]) 01/02/2014  . Insomnia 01/10/2013  . Medicare annual wellness visit, subsequent 11/26/2012  . Hypertension   . History of lacunar cerebrovascular accident (CVA)   . Peripheral vascular disease ([place])   . COPD (chronic obstructive pulmonary disease) ([place])   . Hyperlipidemia   . Osteoarthritis   . MDD (major depressive disorder), recurrent episode, moderate ([place])   . Ex-smoker   . Osteoporosis 11/25/2010    Orientation RESPIRATION BLADDER Height & Weight     Self, Time, Situation, Place  Normal Continent, External catheter Weight: 180 lb 12.4 oz (82 kg) Height:  4\' 11"  (149.9 cm)  BEHAVIORAL SYMPTOMS/MOOD NEUROLOGICAL BOWEL NUTRITION STATUS  (None) (None) Continent Diet(Heart healthy)  AMBULATORY STATUS COMMUNICATION OF NEEDS Skin   Limited Assist Verbally Bruising, Surgical wounds                       Personal Care Assistance Level of Assistance  Bathing, Feeding, Dressing Bathing Assistance: Maximum assistance Feeding assistance: Limited assistance Dressing Assistance: Maximum assistance     Functional Limitations Info  Sight, Hearing, Speech Sight Info: Adequate Hearing Info: Adequate Speech Info: Adequate    SPECIAL CARE FACTORS FREQUENCY  PT (By licensed PT), OT (By licensed OT)     PT Frequency: 5 x week OT Frequency: 5 x week            Contractures Contractures Info: Not present    Additional Factors Info  Code Status, Allergies Code Status Info: Full code Allergies Info: Doxycycline, Fluticasone-salmeterol.  Current Medications (02/17/2020):  This is the current hospital active medication list Current Facility-Administered Medications  Medication Dose Route Frequency Provider Last Rate Last Admin  . acetaminophen (TYLENOL) tablet 650 mg  650 mg Oral Q6H PRN Mansy, Jan A, MD   650 mg at 02/15/20 5102   Or  . acetaminophen (TYLENOL) suppository 650 mg   650 mg Rectal Q6H PRN Mansy, Jan A, MD      . albuterol (PROVENTIL) (2.5 MG/3ML) 0.083% nebulizer solution 2.5 mg  2.5 mg Inhalation Q6H PRN Esaw Grandchild A, DO      . ascorbic acid (VITAMIN C) tablet 500 mg  500 mg Oral Daily Mansy, Jan A, MD   500 mg at 02/17/20 1053  . aspirin EC tablet 81 mg  81 mg Oral Daily Mansy, Jan A, MD   81 mg at 02/17/20 1053  . atorvastatin (LIPITOR) tablet 40 mg  40 mg Oral Daily Mansy, Jan A, MD   40 mg at 02/17/20 1053  . cholecalciferol (VITAMIN D3) tablet 5,000 Units  5,000 Units Oral Daily Mansy, Jan A, MD   5,000 Units at 02/17/20 1053  . clopidogrel (PLAVIX) tablet 75 mg  75 mg Oral Daily Mansy, Jan A, MD   75 mg at 02/17/20 1053  . enoxaparin (LOVENOX) injection 40 mg  40 mg Subcutaneous Q24H Mansy, Jan A, MD   40 mg at 02/16/20 2116  . gabapentin (NEURONTIN) capsule 100 mg  100 mg Oral QHS Esaw Grandchild A, DO   100 mg at 02/16/20 2117  . guaiFENesin (MUCINEX) 12 hr tablet 600 mg  600 mg Oral BID Mansy, Jan A, MD   600 mg at 02/17/20 1053  . ipratropium-albuterol (DUONEB) 0.5-2.5 (3) MG/3ML nebulizer solution 3 mL  3 mL Nebulization Q4H PRN Esaw Grandchild A, DO      . ketorolac (TORADOL) 15 MG/ML injection 15 mg  15 mg Intravenous Q6H PRN Mansy, Jan A, MD   15 mg at 02/16/20 2117  . levothyroxine (SYNTHROID) tablet 50 mcg  50 mcg Oral QAC breakfast Mansy, Jan A, MD   50 mcg at 02/17/20 0459  . magnesium hydroxide (MILK OF MAGNESIA) suspension 30 mL  30 mL Oral Daily PRN Mansy, Jan A, MD      . methocarbamol (ROBAXIN) tablet 500 mg  500 mg Oral Q8H PRN Esaw Grandchild A, DO      . multivitamin with minerals tablet 1 tablet  1 tablet Oral Daily Mansy, Jan A, MD   1 tablet at 02/17/20 1057  . ondansetron (ZOFRAN) tablet 4 mg  4 mg Oral Q6H PRN Mansy, Jan A, MD       Or  . ondansetron [facility]) injection 4 mg  4 mg Intravenous Q6H PRN Mansy, Jan A, MD      . pantoprazole (PROTONIX) EC tablet 40 mg  40 mg Oral Daily Mansy, Jan A, MD   40 mg at 02/17/20 1053   . sertraline (ZOLOFT) tablet 100 mg  100 mg Oral Daily Mansy, Jan A, MD   100 mg at 02/17/20 1053  . sulfamethoxazole-trimethoprim (BACTRIM) 400-80 MG per tablet 0.5 tablet  0.5 tablet Oral Daily Mansy, Jan A, MD   0.5 tablet at 02/17/20 1052  . traMADol (ULTRAM) tablet 50-100 mg  50-100 mg Oral BID PRN Esaw Grandchild A, DO   50 mg at 02/17/20 1113  . zinc sulfate capsule 220 mg  220 mg Oral Daily Mansy, Jan A, MD   220 mg at 02/17/20 1052  Discharge Medications: Please see discharge summary for a list of discharge medications.  Relevant Imaging Results:  Relevant Lab Results:   Additional Information SS#: [id number]  Margarito Liner, LCSW

## 2020-02-17 NOTE — Progress Notes (Signed)
Physical Therapy Treatment Patient Details Name: [name] [name] MRN: [id number] DOB: 1946-04-02 Today's Date: 02/17/2020    History of Present Illness 74 yo female with onset of falls and SOB was admitted and noted hypotension and cleared for PE.  Has new findings of T10 vertebral fracture, but had a kyphoplasty on T9.  Noted susp UTI, low BP.  PMHx:  PN, osteoporosis, stroke, angioplasty R MCA, COPD, atherosclerosis, CHF, PVD, OA    PT Comments    Pt alert family at bedside. Pt reported minor pain in bed, and with mobility 4-5/10 in back. The patient exhibited great motivation with therapy today, family and pt eager to maximize functional abilities. Pt able to perform log rolling technique with verbal reminder, minA for complete trunk elevation, good sitting balance noted. Sit <> stand performed several times, CGA with RW, cueing for hand placement each time. Gait training performed, pt able to ambulate ~76ft twice with close chair follow. MinA for RW management and hand placement, cues for activity pacing and mild SOB noted after each performance. Overall the patient continued to exhibit deficits in activity tolerance, endurance, LE strength, and functional mobility. Current plan remains appropriate to maximize pt independence and safety, pt and family very motivated.  OT consult placed as well, pt unable to doff/don socks without assistance, had been receiving OT at home, and per family pt has had to assist significantly more with ADLs since surgery.    Follow Up Recommendations  CIR     Equipment Recommendations  None recommended by PT    Recommendations for Other Services OT consult     Precautions / Restrictions Precautions Precautions: Fall;Back Precaution Booklet Issued: No Precaution Comments: reviewed precautions Restrictions Weight Bearing Restrictions: No    Mobility  Bed Mobility Overal bed mobility: Needs Assistance Bed Mobility: Sidelying to Sit   Sidelying to  sit: Min assist       General bed mobility comments: minA for complete trunk elevation  Transfers Overall transfer level: Needs assistance Equipment used: Rolling walker (2 wheeled) Transfers: Sit to/from Stand Sit to Stand: Min guard         General transfer comment: cueing for hand placement ea time, extended time needed  Ambulation/Gait Ambulation/Gait assistance: Min assist Gait Distance (Feet): (52ft, twice, with seated rest breaks in between)     Gait velocity: reduced   General Gait Details: Pt needed physical assist for RW management, cued for activity pacing. Exhibited fatigue and mild SOB at end of ambulation.   Stairs             Wheelchair Mobility    Modified Rankin (Stroke Patients Only)       Balance Overall balance assessment: Needs assistance;History of Falls Sitting-balance support: Feet supported Sitting balance-Leahy Scale: Good     Standing balance support: Bilateral upper extremity supported;During functional activity Standing balance-Leahy Scale: Fair                              Cognition Arousal/Alertness: Awake/alert Behavior During Therapy: WFL for tasks assessed/performed Overall Cognitive Status: Within Functional Limits for tasks assessed                                        Exercises General Exercises - Lower Extremity Ankle Circles/Pumps: AROM;Strengthening;Both;10 reps Quad Sets: AROM;Strengthening;Both;10 reps Gluteal Sets: AROM;Strengthening;Both;10 reps Long Arc Quad: AROM;Strengthening;Both;10  reps Hip Flexion/Marching: AROM;Seated;Strengthening;Both;10 reps    General Comments        Pertinent Vitals/Pain Pain Assessment: 0-10 Pain Score: 5  Pain Location: back Pain Descriptors / Indicators: Guarding;Grimacing Pain Intervention(s): Monitored during session;Repositioned    Home Living                      Prior Function            PT Goals (current goals can  now be found in the care plan section) Progress towards PT goals: Progressing toward goals    Frequency    7X/week      PT Plan Current plan remains appropriate    Co-evaluation              AM-PAC PT "6 Clicks" Mobility   Outcome Measure  Help needed turning from your back to your side while in a flat bed without using bedrails?: A Little Help needed moving from lying on your back to sitting on the side of a flat bed without using bedrails?: A Little Help needed moving to and from a bed to a chair (including a wheelchair)?: A Little Help needed standing up from a chair using your arms (e.g., wheelchair or bedside chair)?: A Little Help needed to walk in hospital room?: A Little Help needed climbing 3-5 steps with a railing? : Total 6 Click Score: 16    End of Session Equipment Utilized During Treatment: Gait belt Activity Tolerance: Patient limited by fatigue;Patient tolerated treatment well Patient left: in chair;with call bell/phone within reach;with chair alarm set;with family/visitor present Nurse Communication: Mobility status PT Visit Diagnosis: Unsteadiness on feet (R26.81);Muscle weakness (generalized) (M62.81);History of falling (Z91.81)     Time: 1062-6948 PT Time Calculation (min) (ACUTE ONLY): 30 min  Charges:  $Gait Training: 8-22 mins $Therapeutic Exercise: 8-22 mins                    Lieutenant Diego PT, DPT 9:57 AM,02/17/20

## 2020-02-17 NOTE — TOC Initial Note (Addendum)
Transition of Care [facility]) - Initial/Assessment Note    Patient Details  Name: [name] [name] MRN: [id number] Date of Birth: July 23, 1946  [facility] CM/SW Contact:    Candie Chroman, LCSW Phone Number: 02/17/2020, 12:21 PM  Clinical Narrative: CSW met with patient. No supports at bedside. CSW introduced role and explained that PT recommendations would be discussed. Patient is agreeable to CIR. CSW discussed potential barriers to CIR. Patient willing to consider a SNF backup in the event she is unable to admit to CIR. Provided CMS scores for facilities within 25 miles of her zip code. Will follow up with bed offers if needed. No further concerns. CSW encouraged patient to contact CSW as needed. CSW will continue to follow patient and facilitate discharge to SNF, if needed, once discharged.            4:08 pm: Uploaded requested documents into [place] [facility] for PASARR review. Per CIR admissions coordinator, no updates on insurance authorization yet.       Expected Discharge Plan: IP Rehab Facility Barriers to Discharge: Insurance Authorization   Patient Goals and CMS Choice   CMS Medicare.gov Compare Post Acute Care list provided to:: Patient    Expected Discharge Plan and Services Expected Discharge Plan: [place] Acute Care Choice: IP Rehab Living arrangements for the past 2 months: Single Family Home                                      Prior Living Arrangements/Services Living arrangements for the past 2 months: Single Family Home Lives with:: Adult Children Patient language and need for interpreter reviewed:: Yes Do you feel safe going back to the place where you live?: Yes      Need for Family Participation in Patient Care: Yes (Comment) Care giver support system in place?: Yes (comment) Current home services: Home OT, Home PT Criminal Activity/Legal Involvement Pertinent to Current Situation/Hospitalization: No - Comment as  needed  Activities of Daily Living Home Assistive Devices/Equipment: Walker (specify type), Oxygen, Dentures (specify type), Blood pressure cuff, Grab bars in shower, Grab bars around toilet ADL Screening (condition at time of admission) Patient's cognitive ability adequate to safely complete daily activities?: Yes Is the patient deaf or have difficulty hearing?: No Does the patient have difficulty seeing, even when wearing glasses/contacts?: No Does the patient have difficulty concentrating, remembering, or making decisions?: No Patient able to express need for assistance with ADLs?: Yes Does the patient have difficulty dressing or bathing?: Yes Independently performs ADLs?: No Communication: Independent Dressing (OT): Dependent Is this a change from baseline?: Pre-admission baseline Grooming: Dependent Is this a change from baseline?: Pre-admission baseline Feeding: Independent Bathing: Dependent Is this a change from baseline?: Pre-admission baseline Toileting: Dependent Is this a change from baseline?: Pre-admission baseline In/Out Bed: Dependent Is this a change from baseline?: Pre-admission baseline Walks in Home: Dependent Is this a change from baseline?: Pre-admission baseline Does the patient have difficulty walking or climbing stairs?: Yes Weakness of Legs: Both Weakness of Arms/Hands: None  Permission Sought/Granted Permission sought to share information with : Facility Art therapist granted to share information with : Yes, Verbal Permission Granted     Permission granted to share info w AGENCY: SNF's        Emotional Assessment Appearance:: Appears stated age Attitude/Demeanor/Rapport: Engaged, Gracious Affect (typically observed): Accepting, Appropriate, Calm, Pleasant Orientation: :  Oriented to Self, Oriented to Place, Oriented to  Time, Oriented to Situation Alcohol / Substance Use: Not Applicable Psych Involvement: No  (comment)  Admission diagnosis:  Hypoxia [R09.02] Acute on chronic respiratory failure with hypoxia (HCC) [J96.21] Patient Active Problem List   Diagnosis Date Noted  . Acute on chronic respiratory failure with hypoxia ([place]) 02/15/2020  . Closed T10 fracture ([place]) 01/29/2020  . Acute midline back pain 12/21/2019  . Hearing loss due to cerumen impaction, left 07/08/2019  . Neuropathy 05/06/2019  . CKD (chronic kidney disease) stage 3, GFR 30-59 ml/min 01/24/2019  . Tinnitus of both ears 11/24/2018  . Vision loss of left eye 07/31/2018  . Thrombocytopenia ([place]) 07/31/2018  . Tremor 07/03/2018  . Weakness 05/07/2018  . Chronic diastolic heart failure ([place]) 01/30/2018  . Memory loss 01/30/2018  . Peripheral edema 01/30/2018  . Intertrigo 01/30/2018  . Chronic back pain 11/21/2017  . Chronic respiratory failure with hypoxia ([place]) 11/14/2017  . Shortness of breath 11/14/2017  . Closed compression fracture of fourth lumbar vertebra ([place]) 10/02/2017  . Compression fracture of body of thoracic vertebra ([place]) 09/30/2017  . Hypothyroidism (acquired) 06/28/2017  . Vitamin D deficiency 06/25/2017  . Advanced care planning/counseling discussion 01/29/2015  . Encounter for general adult medical examination with abnormal findings 01/29/2015  . Carotid stenosis 04/03/2014  . Carotid artery aneurysm ([place]) 04/03/2014  . Recurrent falls 01/02/2014  . Hemiparesis affecting right side as late effect of stroke ([place]) 01/02/2014  . Insomnia 01/10/2013  . Medicare annual wellness visit, subsequent 11/26/2012  . Hypertension   . History of lacunar cerebrovascular accident (CVA)   . Peripheral vascular disease ([place])   . COPD (chronic obstructive pulmonary disease) ([place])   . Hyperlipidemia   . Osteoarthritis   . MDD (major depressive disorder), recurrent episode, moderate ([place])   . Ex-smoker   . Osteoporosis 11/25/2010   PCP:  Ria Bush, MD Pharmacy:   [facility] [address]  ([address]), [place] - [place] [address] [place]) [place] [address] Phone: [phone] Fax: [phone]  [place], [place] - [address] [place] [address] Phone: [phone] Fax: [facility] - [place], [place] [address] Phone: [phone] Fax: [phone]     Social Determinants of Health (SDOH) Interventions    Readmission Risk Interventions Readmission Risk Prevention Plan 12/26/2019  Transportation Screening Complete  PCP or Specialist Appt within 5-7 Days Complete  Home Care Screening Complete  Medication Review (RN CM) Complete  Some recent data might be hidden

## 2020-02-17 NOTE — Evaluation (Signed)
Occupational Therapy Evaluation Patient Details Name: [name] [name] MRN: [id number] DOB: 08-19-1946 Today's Date: 02/17/2020    History of Present Illness 74 yo female with onset of falls and SOB was admitted and noted hypotension and cleared for PE.  Has new findings of T10 vertebral fracture, but had a kyphoplasty on T9.  Noted susp UTI, low BP.  PMHx:  PN, osteoporosis, stroke, angioplasty R MCA, COPD, atherosclerosis, CHF, PVD, OA   Clinical Impression   [name] [name] presents this morning awake in the recliner on 2L O2 with her daughter present. She was alert and receptive; deferred to her daughter for some information on PLOF. Prior to kyphoplasty on 01/29/2020, daughter [name] [name]) reports that pt was working with HHOT and HHPT and had increased her level of independence, although she did still need some assistance with bathing and showering. After surgery, pt "had to start from square one again" and needed assist for donning/doffing UB/LB clothing and showering. She used a Museum/gallery exhibitions officer for ambulation, sitting to take frequent rest breaks because of "low strength and endurance." Pt wears pullups and has difficulty making it to the [facility] quickly enough. Pt reports that she has had ~5 falls in the past 6 months and that it is hard to keep her balance and pick her her feet up when walking. She gets nervous using stairs because of previous falls.   During session, pt stood up from recliner using RW, initially with Min A but progressed to CGA with cueing for positioning and sequencing. She required at least one arm supported on RW or counter during handwashing routine to maintain balance. Pt became fatigued and reported 8/10 back pain upon return to chair and requested to get back to bed. She required Min A for BLE mgt and cueing for back precautions. Pt and daughter educated on falls prevention strategies, energy conservation strategies and positioning for increased functional mobility and participation. Both  pt and daughter were receptive and would benefit from reinforcement. Pt will benefit from acute OT services to continue this education and address poor activity tolerance, balance, and decreased independence in ADL in order to minimize pt safety risk and maximize pt return to PLOF. Recommend CIR upon discharge, given pt needs and potential for progress.     Follow Up Recommendations  CIR    Equipment Recommendations  None recommended by OT    Recommendations for Other Services       Precautions / Restrictions Precautions Precautions: Fall;Back Precaution Booklet Issued: No Precaution Comments: reviewed precautions Restrictions Weight Bearing Restrictions: No      Mobility Bed Mobility Overal bed mobility: Needs Assistance Bed Mobility: Sit to Sidelying   Sidelying to sit: Min assist     Sit to sidelying: Min assist General bed mobility comments: Min A for BLE mgt and cueing for maintaining back precautions  Transfers Overall transfer level: Needs assistance Equipment used: Rolling walker (2 wheeled) Transfers: Sit to/from Stand Sit to Stand: Min guard         General transfer comment: Pt initially given Min A using walker for sit to stand, but progressed to Min guard with cueing for positioning. Extended time needed.    Balance Overall balance assessment: Needs assistance;History of Falls Sitting-balance support: Feet supported Sitting balance-Leahy Scale: Good     Standing balance support: Bilateral upper extremity supported;During functional activity Standing balance-Leahy Scale: Poor Standing balance comment: Pt required at least one extremity to be supported during handwashing in order to maintain balance.  ADL either performed or assessed with clinical judgement   ADL Overall ADL's : Needs assistance/impaired                     Lower Body Dressing: Adhering to back precautions;Sit to/from stand;With adaptive  equipment;Cueing for sequencing;Maximal assistance Lower Body Dressing Details (indicate cue type and reason): Per clinical observation and pt/dtr report Toilet Transfer: Minimal assistance;BSC;Ambulation;RW;Cueing for sequencing Toilet Transfer Details (indicate cue type and reason): Per clinical obs, with increased time         Functional mobility during ADLs: Cueing for safety;Cueing for sequencing;Rolling walker;Minimal assistance General ADL Comments: Pt strength, endurance and back pain currently limits ADL participation. Per pt and dtr report, pt independence in ADL/IADL has decreased since surgery, however she did need some assistance for dressing and bathing prior to surgery. Pt reports ~5 falls in the past 6 months.     Vision Baseline Vision/History: No visual deficits Additional Comments: Pt briefly bumped into items on her left side when ambulating, pt reported that was her affected stroke side previously     Perception     Praxis      Pertinent Vitals/Pain Pain Assessment: 0-10 Pain Score: 8 (after ambulating to and from sink) Pain Location: back Pain Descriptors / Indicators: Guarding;Grimacing Pain Intervention(s): Monitored during session;Repositioned;Patient requesting pain meds-RN notified     Hand Dominance Right   Extremity/Trunk Assessment Upper Extremity Assessment Upper Extremity Assessment: RUE deficits/detail;LUE deficits/detail RUE Deficits / Details: shoulder flexion, elbow flexion/extension: grossly 3+/5; some pain noted with AROM above ~145 degrees; RUE tremors noted (normal, per pt) LUE Deficits / Details: weaker than RUE; shoulder flexion, elbow flexion/extension: grossly 3/5; some pain noted with AROM above ~145 degrees   Lower Extremity Assessment Lower Extremity Assessment: Generalized weakness       Communication Communication Communication: No difficulties   Cognition Arousal/Alertness: Awake/alert Behavior During Therapy: WFL for  tasks assessed/performed Overall Cognitive Status: Within Functional Limits for tasks assessed                                 General Comments: Pt occasionally deferred questions about PLOF to her daughter, but overall was [facility]   General Comments  Pt denied dizziness or light headedness throughout; was on room air and no noted desats (97% upon return to chair)    Exercises Other Exercises Other Exercises: Pt and daughter educated on falls prevention strategies, energy conservation strategies and positioning for increased functional mobility and participation. Both pt and daughter were receptive and would benefit from reinforcement.   Shoulder Instructions      Home Living Family/patient expects to be discharged to:: Private residence Living Arrangements: Children(lives with daughter Ilona Sorrel) Available Help at Discharge: Family Type of Home: House Home Access: Stairs to enter Technical brewer of Steps: 5 Entrance Stairs-Rails: Right;Left;Can reach both [place]: One level     Bathroom Shower/Tub: Teacher, early years/pre: (pt was using potty chair) Bathroom Accessibility: No   Home Equipment: Environmental consultant - 4 wheels;Cane - single point;Bedside commode;Tub bench   Additional Comments: daughers and granddaughter provide assistance  Lives With: Daughter    Prior Functioning/Environment Level of Independence: Needs assistance  Gait / Transfers Assistance Needed: Pt difficulty with keeping her balance and picking her feet up to go up stairs. ADL's / Homemaking Assistance Needed: Pt and daughter report that prior to kyphoplasty, pt had worked with [place] and [place] to  increase strength, endurance and independence. After surgery, pt "had to start from square one again" and needed assist for donning/doffing UB and LB clothing and showering. She used a Museum/gallery exhibitions officer for ambulation, sitting to take rest breaks. Pt wears pullups and has difficulty making it to the [facility] quickly  enough.   Comments: Wears 2L O2 at night, began wearing it more during the day after prior kyphoplasty (01/29/2020)        OT Problem List: Decreased strength;Decreased coordination;Pain;Decreased range of motion;Decreased activity tolerance;Impaired balance (sitting and/or standing);Decreased knowledge of use of DME or AE      OT Treatment/Interventions: Self-care/ADL training;Therapeutic exercise;Therapeutic activities;Energy conservation;DME and/or AE instruction;Patient/family education;Balance training    OT Goals(Current goals can be found in the care plan section) Acute Rehab OT Goals Patient Stated Goal: To progress her independence OT Goal Formulation: With patient/family Time For Goal Achievement: 03/02/20 Potential to Achieve Goals: Good ADL Goals Pt Will Perform Upper Body Dressing: sitting;with modified independence(using energy conservation strategies and increased time) Pt Will Transfer to Toilet: with supervision;ambulating;bedside commode;regular height toilet(BSC over toilet for elevation) Additional ADL Goal #1: Pt will independently incorporate 3+ energy conservation strategies into ADL to reduce fatigue and maximize pt independence  OT Frequency:     Barriers to D/C:            Co-evaluation              AM-PAC OT "6 Clicks" Daily Activity     Outcome Measure Help from another person eating meals?: None Help from another person taking care of personal grooming?: A Little Help from another person toileting, which includes using toliet, bedpan, or urinal?: A Lot Help from another person bathing (including washing, rinsing, drying)?: A Lot Help from another person to put on and taking off regular upper body clothing?: A Lot Help from another person to put on and taking off regular lower body clothing?: A Lot 6 Click Score: 16   End of Session Equipment Utilized During Treatment: Gait belt;Rolling walker;Oxygen([place] O2 on at start of session) Nurse  Communication: Patient requests pain meds  Activity Tolerance: Patient tolerated treatment well(requested to return to bed at end of session 2/2 fatigue and low endurance) Patient left: in bed;with call bell/phone within reach;with bed alarm set;with family/visitor present(with MD in the room)  OT Visit Diagnosis: Repeated falls (R29.6);History of falling (Z91.81);Muscle weakness (generalized) (M62.81);Pain;Other abnormalities of gait and mobility (R26.89) Pain - part of body: (Back)                Time: 7672-0947 OT Time Calculation (min): 41 min Charges:  OT General Charges $OT Visit: 1 Visit OT Evaluation $OT Eval Moderate Complexity: 1 Mod OT Treatments $Self Care/Home Management : 23-37 mins  Maurilio Lovely, OTS 02/17/20, 11:50 AM

## 2020-02-17 NOTE — Progress Notes (Addendum)
PROGRESS NOTE    [name] [name]   GUY:[id number]  DOB: 1946/01/06  PCP: Ria Bush, MD    DOA: 02/14/2020 LOS: 2   Brief Narrative   [name] [name]  is a 74 y.o. Caucasian female with a medical history of COPD (uses 2 L/min nocturnal oxygen at baseline), hypertension, dyslipidemia, hypothyroidism, CHF and peripheral vascular disease, who presented to the ED on 02/14/20 with generalized weakness, worsening dyspnea and increased home oxygen requirements since her recent kyphoplasty for T10 compression fracture on 01/28/09.  Also with reports of mild wheezing on presentation.  She has been needing 2-3 L/min continuous oxygen since the procedure.   She has been taking tramadol, oxycodone and Valium together with trazodone and melatonin at night.  Painkillers and muscle relaxants were prescribed after her kyphoplasty.  Patient's daughter reports that some of these medications are "too much" for the patient.  In the ED, vitals initially stable, but later became hypotensive at 85/51.  Oxygen saturation initially 89-91%, improved to 100% on 2 L/min oxygen.  Chest xray showed low lung volumes and bibasilar atelectasis.  CTA chest ruled out pulmonary embolism, but also showed bibasilar atelectasis and a new pulmonary nodule thought to be post-inflammatory, and changes related to her T10 kyphoplasty.  Patient was treated for pain and given Duoneb in the ED.  Admitted to hospitalist service for further management.   Daughter reports patient has become profoundly weak overall since the procedure, is now requiring much more assistance than at her baseline.  PT and OT evaluated patient and recommend CIR.       Assessment & Plan   Principal Problem:   Acute on chronic respiratory failure with hypoxia (HCC) Active Problems:   COPD (chronic obstructive pulmonary disease) (HCC)   Compression fracture of body of thoracic vertebra (HCC)   Hypertension   Peripheral vascular disease (HCC)   Chronic  back pain   Hyperlipidemia   Insomnia   Hypothyroidism (acquired)  Generalized Weakness - secondary to recent back pain limiting her mobility and tolerance for activity.  PT and OT evaluated and recommend CIR on discharge.  Admission assessment underway.  Acute on chronic respiratory failure with hypoxia - multifactorial, due to shallow inspirations due to back pain s/p fracture and kyphoplasty, sedating medications and possibly mild COPD exacerbation.  Baseline O2 need is 2 L/min at night.  Requiring 3 L/min continuous on presentation. --supplemental O2 as needed, maintain O2 sat > 90% --incentive spirometer, encourage frequent use when pain controlled --IV Toradol PRN to avoid sedation --continueTramadol PRN --continue Robaxin for muscle spasms --holding Norco and Oxy - too sedating for patient --monitor for over-sedation  COPD - with mild exacerbation present on admission, as above.  Appears to use only albulterol HFA PRN at home.  Not wheezing and weaned off oxygen, will stop steroids. Clinically patient's COPD is stable and not exacerbated.   --Duonebs PRN --Mucinex BID --home Albuterol inhaler  Compression fracture of body of thoracic vertebra - recent T9 kyphoplasty.  Continues to have pain and muscle spasms that limit deep inspirations.  Mgmt as above.  Hypotension - present on admission, resolved.  Off IV fluids.  History of Hypertension / Edema - diuretics are held for now.  Has lower DBP's would use diuretics with caution.   Peripheral vascular disease - continue ASA and Plavix.  Chronic back pain - currently with increased pain with fracture.  Mgmt as above.    Hyperlipidemia - continue atorvastatin  Depression/Anxiety/Insomnia - continue Zoloft, trazodone.  Hold  Valium.  Hypothyroidism (acquired) - continue Synthroid.  GERD - continue PPI   Obesity: Body mass index is 36.51 kg/m.   Complicates overall care and prognosis.  DVT prophylaxis: Lovenox  Diet:  Diet  Orders (From admission, onward)    Start     Ordered   02/14/20 2053  Diet Heart Room service appropriate? Yes; Fluid consistency: Thin  Diet effective now    Question Answer Comment  Room service appropriate? Yes   Fluid consistency: Thin      02/14/20 2055            Code Status: Full Code    Subjective 02/17/20    Patient seen with daughter at bedside this AM.  No acute events reported. She was finishing up work with OT during encounter.  Patient reports feeling well.  Says back pain controlled for the most part, does have increased pain with moving around.  Denies SOB, fever/chills, N/V/D or other acute complaints.   Disposition Plan & Communication   Status is: Inpatient  Remains inpatient appropriate because:Unsafe d/c plan.  Therapy recommending CIR for rehab, patient is too weak to safely return home at this time.  Rehab placement in progress.  In addition, patient with hypotension this afternoon following dialysis, requires continued close monitoring.   Dispo: The patient is from: Home              Anticipated d/c is to: CIR              Anticipated d/c date is: 1 day if accepted and bed ready              Patient currently is not medically stable for discharge.   Family Communication: daughter at bedside during encounter, updated and all questions answered.    Consults, Procedures, Significant Events   Consultants:   none  Procedures:   none  Antimicrobials:   Rocephin 5/21>>5/22    Objective   Vitals:   02/16/20 0550 02/16/20 0930 02/16/20 2037 02/17/20 0528  BP: (!) 156/65 (!) 129/50 140/70 (!) 168/69  Pulse: 83 93 86 80  Resp: 16 20 16 16   Temp: 98.2 F (36.8 C) 98.4 F (36.9 C) 98 F (36.7 C) 97.6 F (36.4 C)  TempSrc: Oral Oral Oral Oral  SpO2: 95% 97% 96% 100%  Weight:      Height:        Intake/Output Summary (Last 24 hours) at 02/17/2020 1050 Last data filed at 02/17/2020 1000 Gross per 24 hour  Intake 600 ml  Output 550 ml    Net 50 ml   Filed Weights   02/14/20 1351 02/15/20 0100  Weight: 77.1 kg 82 kg    Physical Exam:  General exam: awake, alert, no acute distress HEENT: moist mucus membranes, hearing grossly normal  Respiratory system: CTAB, no wheezes, rales or rhonchi, normal respiratory effort. Cardiovascular system: normal S1/S2, RRR, trace pedal edema.   Central nervous system: A&O x3. no gross focal neurologic deficits, normal speech Extremities: moves all, no cyanosis, normal tone Psychiatry: normal mood, congruent affect, judgement and insight appear normal  Labs   Data Reviewed: I have personally reviewed following labs and imaging studies  CBC: Recent Labs  Lab 02/14/20 1356 02/15/20 0419  WBC 10.5 6.5  HGB 12.2 11.1*  HCT 38.4 35.0*  MCV 93.7 92.6  PLT 208 169   Basic Metabolic Panel: Recent Labs  Lab 02/14/20 1356 02/15/20 0419  NA 139 139  K 4.6 4.5  CL 104  106  CO2 27 27  GLUCOSE 98 176*  BUN 18 18  CREATININE 1.00 1.01*  CALCIUM 8.7* 8.7*   GFR: Estimated Creatinine Clearance: 46 mL/min (A) (by C-G formula based on SCr of 1.01 mg/dL (H)). Liver Function Tests: No results for input(s): AST, ALT, ALKPHOS, BILITOT, PROT, ALBUMIN in the last 168 hours. No results for input(s): LIPASE, AMYLASE in the last 168 hours. No results for input(s): AMMONIA in the last 168 hours. Coagulation Profile: No results for input(s): INR, PROTIME in the last 168 hours. Cardiac Enzymes: No results for input(s): CKTOTAL, CKMB, CKMBINDEX, TROPONINI in the last 168 hours. BNP (last 3 results) Recent Labs    11/05/19 1438  PROBNP 32.0   HbA1C: No results for input(s): HGBA1C in the last 72 hours. CBG: No results for input(s): GLUCAP in the last 168 hours. Lipid Profile: No results for input(s): CHOL, HDL, LDLCALC, TRIG, CHOLHDL, LDLDIRECT in the last 72 hours. Thyroid Function Tests: Recent Labs    02/14/20 2326  TSH 4.188   Anemia Panel: No results for input(s):  VITAMINB12, FOLATE, FERRITIN, TIBC, IRON, RETICCTPCT in the last 72 hours. Sepsis Labs: No results for input(s): PROCALCITON, LATICACIDVEN in the last 168 hours.  Recent Results (from the past 240 hour(s))  SARS Coronavirus 2 by RT PCR (hospital order, performed in [facility] hospital lab) Nasopharyngeal Nasopharyngeal Swab     Status: None   Collection Time: 02/14/20  8:14 PM   Specimen: Nasopharyngeal Swab  Result Value Ref Range Status   SARS Coronavirus 2 NEGATIVE NEGATIVE Final    Comment: (NOTE) SARS-CoV-2 target nucleic acids are NOT DETECTED. The SARS-CoV-2 RNA is generally detectable in upper and lower respiratory specimens during the acute phase of infection. The lowest concentration of SARS-CoV-2 viral copies this assay can detect is 250 copies / mL. A negative result does not preclude SARS-CoV-2 infection and should not be used as the sole basis for treatment or other patient management decisions.  A negative result may occur with improper specimen collection / handling, submission of specimen other than nasopharyngeal swab, presence of viral mutation(s) within the areas targeted by this assay, and inadequate number of viral copies (<250 copies / mL). A negative result must be combined with clinical observations, patient history, and epidemiological information. Fact Sheet for Patients:   BoilerBrush.com.cy Fact Sheet for Healthcare Providers: https://pope.com/ This test is not yet approved or cleared  by the [place] FDA and has been authorized for detection and/or diagnosis of SARS-CoV-2 by FDA under an Emergency Use Authorization (EUA).  This EUA will remain in effect (meaning this test can be used) for the duration of the COVID-19 declaration under Section 564(b)(1) of the Act, 21 U.S.C. section 360bbb-3(b)(1), unless the authorization is terminated or revoked sooner. Performed at [facility], [address] [place], [place] [address]       Imaging Studies   No results found.   Medications   Scheduled Meds: . vitamin C  500 mg Oral Daily  . aspirin EC  81 mg Oral Daily  . atorvastatin  40 mg Oral Daily  . cholecalciferol  5,000 Units Oral Daily  . clopidogrel  75 mg Oral Daily  . enoxaparin (LOVENOX) injection  40 mg Subcutaneous Q24H  . gabapentin  100 mg Oral QHS  . guaiFENesin  600 mg Oral BID  . levothyroxine  50 mcg Oral QAC breakfast  . multivitamin with minerals  1 tablet Oral Daily  . pantoprazole  40 mg Oral  Daily  . sertraline  100 mg Oral Daily  . sulfamethoxazole-trimethoprim  0.5 tablet Oral Daily  . trimethoprim  100 mg Oral Daily  . zinc sulfate  220 mg Oral Daily   Continuous Infusions:     LOS: 2 days    Time spent: 25 minutes    Pennie Banter, DO [facility]  02/17/2020, 10:50 AM    If 7PM-7AM, please contact night-coverage. How to contact the [facility] Attending or Consulting provider 7A - 7P or covering provider during after hours 7P -7A, for this patient?    1. Check the care team in [facility] and look for a) attending/consulting [facility] provider listed and b) the [facility] team listed 2. Log into [email or web address] and use [place]'s universal password to access. If you do not have the password, please contact the hospital operator. 3. Locate the [facility] provider you are looking for under [facility] and page to a number that you can be directly reached. 4. If you still have difficulty reaching the provider, please page the [facility] (Director on Call) for the Hospitalists listed on amion for assistance.

## 2020-02-18 ENCOUNTER — Encounter (HOSPITAL_COMMUNITY): Payer: Medicare HMO | Admitting: Psychology

## 2020-02-18 ENCOUNTER — Inpatient Hospital Stay: Payer: Medicare HMO

## 2020-02-18 DIAGNOSIS — Z87440 Personal history of urinary (tract) infections: Secondary | ICD-10-CM | POA: Diagnosis present

## 2020-02-18 DIAGNOSIS — I739 Peripheral vascular disease, unspecified: Secondary | ICD-10-CM

## 2020-02-18 DIAGNOSIS — E039 Hypothyroidism, unspecified: Secondary | ICD-10-CM

## 2020-02-18 LAB — CBC
HCT: 41.5 % (ref 36.0–46.0)
Hemoglobin: 12.9 g/dL (ref 12.0–15.0)
MCH: 29.1 pg (ref 26.0–34.0)
MCHC: 31.1 g/dL (ref 30.0–36.0)
MCV: 93.5 fL (ref 80.0–100.0)
Platelets: 232 10*3/uL (ref 150–400)
RBC: 4.44 MIL/uL (ref 3.87–5.11)
RDW: 13.3 % (ref 11.5–15.5)
WBC: 8.8 10*3/uL (ref 4.0–10.5)
nRBC: 0 % (ref 0.0–0.2)

## 2020-02-18 LAB — BASIC METABOLIC PANEL
Anion gap: 7 (ref 5–15)
BUN: 25 mg/dL — ABNORMAL HIGH (ref 8–23)
CO2: 26 mmol/L (ref 22–32)
Calcium: 8.6 mg/dL — ABNORMAL LOW (ref 8.9–10.3)
Chloride: 104 mmol/L (ref 98–111)
Creatinine, Ser: 1.16 mg/dL — ABNORMAL HIGH (ref 0.44–1.00)
GFR calc Af Amer: 54 mL/min — ABNORMAL LOW (ref >60)
GFR calc non Af Amer: 47 mL/min — ABNORMAL LOW (ref >60)
Glucose, Bld: 108 mg/dL — ABNORMAL HIGH (ref 70–99)
Potassium: 4.4 mmol/L (ref 3.5–5.1)
Sodium: 137 mmol/L (ref 135–145)

## 2020-02-18 LAB — URINALYSIS, COMPLETE (UACMP) WITH MICROSCOPIC
Bacteria, UA: NONE SEEN
Bilirubin Urine: NEGATIVE
Glucose, UA: NEGATIVE mg/dL
Hgb urine dipstick: NEGATIVE
Ketones, ur: NEGATIVE mg/dL
Nitrite: NEGATIVE
Protein, ur: NEGATIVE mg/dL
Specific Gravity, Urine: 1.024 (ref 1.005–1.030)
pH: 6 (ref 5.0–8.0)

## 2020-02-18 MED ORDER — TRIMETHOPRIM 100 MG PO TABS
100.0000 mg | ORAL_TABLET | Freq: Every day | ORAL | Status: DC
  Administered 2020-02-19 – 2020-02-20 (×2): 100 mg via ORAL
  Filled 2020-02-18 (×2): qty 1

## 2020-02-18 MED ORDER — TRAZODONE HCL 100 MG PO TABS
100.0000 mg | ORAL_TABLET | Freq: Every day | ORAL | Status: DC
  Administered 2020-02-18 – 2020-02-19 (×2): 100 mg via ORAL
  Filled 2020-02-18 (×2): qty 1

## 2020-02-18 NOTE — TOC Progression Note (Signed)
Transition of Care [facility]) - Progression Note    Patient Details  Name: [name] [name] MRN: [id number] Date of Birth: Nov 04, 1945  [facility] CM/SW Contact  Maree Krabbe, LCSW Phone Number: 02/18/2020, 1:01 PM  Clinical Narrative:   Pt was denied insurance auth for CIR. CSW spoke with pt's daughter [name] and she states she does not know if they want pt to go to SNF or home with [facility]. Pt's daughter states she will be at the hospital at 5 PM after she gets off work and will discuss it with pt. CSW to follow up.     Expected Discharge Plan: IP Rehab Facility Barriers to Discharge: Insurance Authorization  Expected Discharge Plan and Services Expected Discharge Plan: IP Rehab Facility     Post Acute Care Choice: IP Rehab Living arrangements for the past 2 months: Single Family Home                                       Social Determinants of Health (SDOH) Interventions    Readmission Risk Interventions Readmission Risk Prevention Plan 12/26/2019  Transportation Screening Complete  PCP or Specialist Appt within 5-7 Days Complete  Home Care Screening Complete  Medication Review (RN CM) Complete  Some recent data might be hidden

## 2020-02-18 NOTE — Progress Notes (Signed)
Physical Therapy Treatment Patient Details Name: [name] [name] MRN: [id number] DOB: 10-28-1945 Today's Date: 02/18/2020    History of Present Illness 74 yo female with onset of falls and SOB was admitted and noted hypotension and cleared for PE.  Has new findings of T10 vertebral fracture, but had a kyphoplasty on T9.  Noted susp UTI, low BP.  PMHx:  PN, osteoporosis, stroke, angioplasty R MCA, COPD, atherosclerosis, CHF, PVD, OA    PT Comments    Pt alert, reported more fatigue today than yesterday, also with tremors of RUE this session, pt stated this is a chronic issue. The patient was able to perform exercises with verbal cueing, AROM. Supine to sit with minA and sit <> stand with RW from EOB with minA as well. Sit <> Stand from recliner several times as well, minA for cueing for hand placement. The patient ambulated ~24ft twice with seated rest break between bouts. Pt with elevated HR (high 120s) while ambulating spO2 on room air WFLs. Pt fatigued quicker this session but remained motivated. Pt up in chair, all needs in reach at end of session. The patient would benefit from further skilled PT intervention to continue to progress towards goals. Recommendation remains appropriate.     Follow Up Recommendations  CIR     Equipment Recommendations  None recommended by PT    Recommendations for Other Services OT consult     Precautions / Restrictions Precautions Precautions: Fall;Back Precaution Booklet Issued: No Precaution Comments: reviewed precautions Restrictions Weight Bearing Restrictions: No    Mobility  Bed Mobility Overal bed mobility: Needs Assistance Bed Mobility: Sit to Sidelying   Sidelying to sit: Min assist       General bed mobility comments: Min A for BLE mgt and cueing for maintaining back precautions  Transfers Overall transfer level: Needs assistance Equipment used: Rolling walker (2 wheeled) Transfers: Sit to/from Stand Sit to Stand: Min  assist         General transfer comment: minA for initial clearance of buttocks from bed  Ambulation/Gait Ambulation/Gait assistance: Min guard Gait Distance (Feet): (62ft, seated rest break, 58ft) Assistive device: Rolling walker (2 wheeled) Gait Pattern/deviations: Step-through pattern;Decreased stride length;Wide base of support;Trunk flexed Gait velocity: reduced   General Gait Details: Pt with elevated HR this session high 120s during ambulation. Pt fatigued quicker, a bit more tremulous this session as well.   Stairs             Wheelchair Mobility    Modified Rankin (Stroke Patients Only)       Balance Overall balance assessment: Needs assistance Sitting-balance support: Feet supported Sitting balance-Leahy Scale: Fair     Standing balance support: Bilateral upper extremity supported;During functional activity Standing balance-Leahy Scale: Poor                              Cognition Arousal/Alertness: Awake/alert Behavior During Therapy: WFL for tasks assessed/performed Overall Cognitive Status: Within Functional Limits for tasks assessed                                        Exercises General Exercises - Lower Extremity Ankle Circles/Pumps: AROM;Strengthening;Both;15 reps Long Arc Quad: AROM;Strengthening;Both;10 reps Heel Slides: AROM;Strengthening;Both;15 reps Hip ABduction/ADduction: AROM;Strengthening;Both;15 reps Hip Flexion/Marching: AROM;Seated;Strengthening;Both;10 reps    General Comments        Pertinent Vitals/Pain Pain Assessment:  0-10 Pain Score: 5  Pain Location: back Pain Descriptors / Indicators: Guarding;Grimacing;Aching Pain Intervention(s): Patient requesting pain meds-RN notified;Monitored during session;Premedicated before session;Repositioned    Home Living                      Prior Function            PT Goals (current goals can now be found in the care plan section)  Progress towards PT goals: Progressing toward goals    Frequency    7X/week      PT Plan Current plan remains appropriate    Co-evaluation              AM-PAC PT "6 Clicks" Mobility   Outcome Measure  Help needed turning from your back to your side while in a flat bed without using bedrails?: A Little Help needed moving from lying on your back to sitting on the side of a flat bed without using bedrails?: A Little Help needed moving to and from a bed to a chair (including a wheelchair)?: A Little Help needed standing up from a chair using your arms (e.g., wheelchair or bedside chair)?: A Little Help needed to walk in hospital room?: A Little Help needed climbing 3-5 steps with a railing? : Total 6 Click Score: 16    End of Session Equipment Utilized During Treatment: Gait belt Activity Tolerance: Patient limited by fatigue;Patient tolerated treatment well Patient left: in chair;with call bell/phone within reach;with chair alarm set;with family/visitor present Nurse Communication: Mobility status PT Visit Diagnosis: Unsteadiness on feet (R26.81);Muscle weakness (generalized) (M62.81);History of falling (Z91.81)     Time: 1829-9371 PT Time Calculation (min) (ACUTE ONLY): 25 min  Charges:  $Therapeutic Exercise: 23-37 mins                    Lieutenant Diego PT, DPT 11:03 AM,02/18/20

## 2020-02-18 NOTE — Progress Notes (Addendum)
PROGRESS NOTE    [name] [name]   XKG:[id number]  DOB: Jun 02, 1946  PCP: [name], MD    DOA: 02/14/2020 LOS: 3   Brief Narrative   [name] [name]  is a 74 y.o. Caucasian female with a medical history of COPD (uses 2 L/min nocturnal oxygen at baseline), recurrent UTI's followed by urology (on prophylactic trimethoprim), hypertension, dyslipidemia, hypothyroidism, CHF, peripheral vascular disease, who presented to the ED on 02/14/20 with generalized weakness, worsening dyspnea and increased home oxygen requirements since her recent kyphoplasty for T10 compression fracture on 01/28/09.  Also with reports of mild wheezing on presentation.  She has been needing 2-3 L/min continuous oxygen since the procedure.   She has been taking tramadol, oxycodone and Valium together with trazodone and melatonin at night.  Painkillers and muscle relaxants were prescribed after her kyphoplasty.  [name] [name]'s daughter reports that some of these medications are "too much" for the [name] [name].  In the ED, vitals initially stable, but later became hypotensive at 85/51.  Oxygen saturation initially 89-91%, improved to 100% on 2 L/min oxygen.  Chest xray showed low lung volumes and bibasilar atelectasis.  CTA chest ruled out pulmonary embolism, but also showed bibasilar atelectasis and a new pulmonary nodule thought to be post-inflammatory, and changes related to her T10 kyphoplasty.  [name] [name] was treated for pain and given Duoneb in the ED.  Admitted to hospitalist service for further management.   Daughter reports [name] [name] has become profoundly weak overall since the procedure, is now requiring much more assistance than at her baseline.  PT and OT evaluated [name] [name] and recommend CIR.       Assessment & Plan   Principal Problem:   Acute on chronic respiratory failure with hypoxia (HCC) Active Problems:   COPD (chronic obstructive pulmonary disease) (HCC)   Compression fracture of body of thoracic vertebra (HCC)   Hypertension   Peripheral vascular disease (HCC)   Chronic back pain   Hyperlipidemia   Insomnia   Hypothyroidism (acquired)   History of recurrent UTIs  Tachycardia - [name] [name]'s HR elevation more persistent this AM and [name] [name] feeling more weak. She denies urinary or respiratory complaints, fever or chills.  Not hypoxic or short of breath.  Hemoglobin stable.  Does not seem dehydrated. --check UA, urine culture and CXR --CBC pending  Generalized Weakness - secondary to recent back pain limiting her mobility and tolerance for activity.  PT and OT evaluated and recommend CIR on discharge.  Admission assessment underway.  Acute on chronic respiratory failure with hypoxia - multifactorial, due to shallow inspirations due to back pain s/p fracture and kyphoplasty, sedating medications and possibly mild COPD exacerbation.  Baseline O2 need is 2 L/min at night.  Requiring 3 L/min continuous on presentation. --supplemental O2 as needed, maintain O2 sat > 90% --incentive spirometer, encourage frequent use when pain controlled --IV Toradol PRN to avoid sedation --continueTramadol PRN --continue Robaxin for muscle spasms --holding Norco and Oxy - too sedating for [name] [name] --monitor for over-sedation  COPD - with mild exacerbation present on admission, as above.  Appears to use only albulterol HFA PRN at home.  Not wheezing and weaned off oxygen, will stop steroids. Clinically [name] [name]'s COPD is stable and not exacerbated.   --Duonebs PRN --Mucinex BID --home Albuterol inhaler  Compression fracture of body of thoracic vertebra - recent T9 kyphoplasty.  Continues to have pain and muscle spasms that limit deep inspirations.  Mgmt as above.  Hypotension - present on admission, resolved.  Off IV fluids.  History  of Hypertension / Edema - diuretics are held for now.  Has lower DBP's would use diuretics with caution.   Peripheral vascular disease - continue ASA and Plavix.  Chronic back pain -  currently with increased pain with fracture.  Mgmt as above.    History of Recurrent UTI's - follows with urology, on prophylactic trimethoprim.  Continued.  [name] [name] is asymptomatic.  Monitor for symptoms.  Culture pending as above for tachycardia eval.  Hyperlipidemia - continue atorvastatin  Depression/Anxiety/Insomnia - continue Zoloft, trazodone.  Hold Valium.  Hypothyroidism (acquired) - continue Synthroid.  GERD - continue PPI   Obesity: Body mass index is 36.51 kg/m.   Complicates overall care and prognosis.  DVT prophylaxis: Lovenox  Diet:  Diet Orders (From admission, onward)    Start     Ordered   02/14/20 2053  Diet Heart Room service appropriate? Yes; Fluid consistency: Thin  Diet effective now    Question Answer Comment  Room service appropriate? Yes   Fluid consistency: Thin      02/14/20 2055            Code Status: Full Code    Subjective 02/18/20    [name] [name] seen up in chair after working with PT this AM.  No acute events reported. She reports not feeling as well as yesterday, feels more weak.  PT informed me of the same.  Denies dysuria or urinary urgency or frequency, SOB, cough, fever/chills, N/V/D or other acute complaints.   Disposition Plan & Communication   Status is: Inpatient  Remains inpatient appropriate because:Unsafe d/c plan.  Therapy recommending CIR or SNF for rehab, [name] [name] is too weak to safely return home at this time.  Rehab placement in progress.  [name] [name] with tachycardia today, evaluation as above underway.   Dispo: The [name] [name] is from: Home              Anticipated d/c is to: SNF              Anticipated d/c date is: 1 day if accepted and bed ready              [name] [name] currently is not medically stable for discharge.   Family Communication: spoke with daughter in hallway on her way home prior to [name] [name] encounter this AM.  Updated and questions answered.    Consults, Procedures, Significant Events   Consultants:    none  Procedures:   none  Antimicrobials:   Rocephin 5/21>>5/22    Objective   Vitals:   02/17/20 2038 02/18/20 0535 02/18/20 1110 02/18/20 1950  BP: (!) 142/69 (!) 154/86 138/84 (!) 124/54  Pulse: 86 82 97 75  Resp: 18 18  20   Temp: 98.2 F (36.8 C) 97.7 F (36.5 C)  98.7 F (37.1 C)  TempSrc: Oral Oral  Oral  SpO2: 98% 99%  95%  Weight:      Height:        Intake/Output Summary (Last 24 hours) at 02/18/2020 2006 Last data filed at 02/18/2020 1300 Gross per 24 hour  Intake 480 ml  Output 600 ml  Net -120 ml   Filed Weights   02/14/20 1351 02/15/20 0100  Weight: 77.1 kg 82 kg    Physical Exam:  General exam: awake, alert, no acute distress HEENT: moist mucus membranes, hearing grossly normal  Respiratory system: CTAB, no wheezes, rales or rhonchi, normal respiratory effort. Cardiovascular system: normal S1/S2, RRR, trace pedal edema.   Central nervous system: A&O x3. no gross focal  neurologic deficits, normal speech Extremities: moves all, no cyanosis, normal tone Psychiatry: normal mood, congruent affect, judgement and insight appear normal  Labs   Data Reviewed: I have personally reviewed following labs and imaging studies  CBC: Recent Labs  Lab 02/14/20 1356 02/15/20 0419 02/18/20 1207  WBC 10.5 6.5 8.8  HGB 12.2 11.1* 12.9  HCT 38.4 35.0* 41.5  MCV 93.7 92.6 93.5  PLT 208 169 232   Basic Metabolic Panel: Recent Labs  Lab 02/14/20 1356 02/15/20 0419 02/18/20 1207  NA 139 139 137  K 4.6 4.5 4.4  CL 104 106 104  CO2 27 27 26   GLUCOSE 98 176* 108*  BUN 18 18 25*  CREATININE 1.00 1.01* 1.16*  CALCIUM 8.7* 8.7* 8.6*   GFR: Estimated Creatinine Clearance: 40 mL/min (A) (by C-G formula based on SCr of 1.16 mg/dL (H)). Liver Function Tests: No results for input(s): AST, ALT, ALKPHOS, BILITOT, PROT, ALBUMIN in the last 168 hours. No results for input(s): LIPASE, AMYLASE in the last 168 hours. No results for input(s): AMMONIA in the last  168 hours. Coagulation Profile: No results for input(s): INR, PROTIME in the last 168 hours. Cardiac Enzymes: No results for input(s): CKTOTAL, CKMB, CKMBINDEX, TROPONINI in the last 168 hours. BNP (last 3 results) Recent Labs    11/05/19 1438  PROBNP 32.0   HbA1C: No results for input(s): HGBA1C in the last 72 hours. CBG: No results for input(s): GLUCAP in the last 168 hours. Lipid Profile: No results for input(s): CHOL, HDL, LDLCALC, TRIG, CHOLHDL, LDLDIRECT in the last 72 hours. Thyroid Function Tests: No results for input(s): TSH, T4TOTAL, FREET4, T3FREE, THYROIDAB in the last 72 hours. Anemia Panel: No results for input(s): VITAMINB12, FOLATE, FERRITIN, TIBC, IRON, RETICCTPCT in the last 72 hours. Sepsis Labs: No results for input(s): PROCALCITON, LATICACIDVEN in the last 168 hours.  Recent Results (from the past 240 hour(s))  SARS Coronavirus 2 by RT PCR (hospital order, performed in [facility] hospital lab) Nasopharyngeal Nasopharyngeal Swab     Status: None   Collection Time: 02/14/20  8:14 PM   Specimen: Nasopharyngeal Swab  Result Value Ref Range Status   SARS Coronavirus 2 NEGATIVE NEGATIVE Final    Comment: (NOTE) SARS-CoV-2 target nucleic acids are NOT DETECTED. The SARS-CoV-2 RNA is generally detectable in upper and lower respiratory specimens during the acute phase of infection. The lowest concentration of SARS-CoV-2 viral copies this assay can detect is 250 copies / mL. A negative result does not preclude SARS-CoV-2 infection and should not be used as the sole basis for treatment or other [name] [name] management decisions.  A negative result may occur with improper specimen collection / handling, submission of specimen other than nasopharyngeal swab, presence of viral mutation(s) within the areas targeted by this assay, and inadequate number of viral copies (<250 copies / mL). A negative result must be combined with clinical observations, [name] [name] history, and  epidemiological information. Fact Sheet for Patients:   02/16/20 Fact Sheet for Healthcare Providers: BoilerBrush.com.cy This test is not yet approved or cleared  by the https://pope.com/ FDA and has been authorized for detection and/or diagnosis of SARS-CoV-2 by FDA under an Emergency Use Authorization (EUA).  This EUA will remain in effect (meaning this test can be used) for the duration of the COVID-19 declaration under Section 564(b)(1) of the Act, 21 U.S.C. section 360bbb-3(b)(1), unless the authorization is terminated or revoked sooner. Performed at [facility], [address], [place], [place]  Imaging Studies   DG Chest Port 1 View  Result Date: 02/18/2020 CLINICAL DATA:  Tachycardia EXAM: PORTABLE CHEST 1 VIEW COMPARISON:  02/14/2019 FINDINGS: Cardiac shadow is enlarged but stable. Changes of prior vertebral augmentation are seen. Aortic calcifications are noted. Scattered calcified granulomas are seen. Minimal basilar atelectasis is again noted. No bony abnormality is seen. IMPRESSION: Stable appearance of the chest from 5 days previous. Electronically Signed   By: Alcide Clever M.D.   On: 02/18/2020 10:46     Medications   Scheduled Meds: . vitamin C  500 mg Oral Daily  . aspirin EC  81 mg Oral Daily  . atorvastatin  40 mg Oral Daily  . cholecalciferol  5,000 Units Oral Daily  . clopidogrel  75 mg Oral Daily  . enoxaparin (LOVENOX) injection  40 mg Subcutaneous Q24H  . gabapentin  100 mg Oral QHS  . guaiFENesin  600 mg Oral BID  . levothyroxine  50 mcg Oral QAC breakfast  . multivitamin with minerals  1 tablet Oral Daily  . pantoprazole  40 mg Oral Daily  . sertraline  100 mg Oral Daily  . traZODone  100 mg Oral QHS  . [START ON 02/19/2020] trimethoprim  100 mg Oral Daily  . zinc sulfate  220 mg Oral Daily   Continuous Infusions:     LOS: 3 days    Time spent: 30  minutes    Pennie Banter, DO [facility]  02/18/2020, 8:06 PM    If 7PM-7AM, please contact night-coverage. How to contact the [facility] Attending or Consulting provider 7A - 7P or covering provider during after hours 7P -7A, for this [name] [name]?    1. Check the care team in [facility] and look for a) attending/consulting [facility] provider listed and b) the [employer] team listed 2. Log into [email or web address] and use [place]'s universal password to access. If you do not have the password, please contact the hospital operator. 3. Locate the [facility] provider you are looking for under [facility] and page to a number that you can be directly reached. 4. If you still have difficulty reaching the provider, please page the [facility] (Director on Call) for the Hospitalists listed on amion for assistance.

## 2020-02-18 NOTE — Care Management Important Message (Signed)
Important Message  Patient Details  Name: [name] [name] MRN: [id number] Date of Birth: August 19, 1946   Medicare Important Message Given:  Yes     Johnell Comings 02/18/2020, 11:10 AM

## 2020-02-18 NOTE — Progress Notes (Signed)
Inpatient Rehabilitation Admissions Coordinator  I have received a denial form [employer] for an CIR admit . I have contacted Dr. Denton Lank, patient via phone, as well as daughter, [name]. [name] is undecided about next dispo steps. I contacted Clarisse Gouge, SW with [facility] team of dispo needs. We will sign off at this time. Please call me with any questions.  Ottie Glazier, RN, MSN Rehab Admissions Coordinator [phone] 02/18/2020 10:29 AM

## 2020-02-19 MED ORDER — CYCLOBENZAPRINE HCL 10 MG PO TABS
10.0000 mg | ORAL_TABLET | Freq: Two times a day (BID) | ORAL | Status: DC
  Administered 2020-02-19 – 2020-02-20 (×3): 10 mg via ORAL
  Filled 2020-02-19 (×3): qty 1

## 2020-02-19 NOTE — Progress Notes (Signed)
OT Cancellation Note  Patient Details Name: [name] [name] MRN: [id number] DOB: November 10, 1945   Cancelled Treatment:    Reason Eval/Treat Not Completed: Patient declined, no reason specified   Attempted to engage pt in OT tx, but pt politely declined.  Pt stated fatigue after recently working with physical therapy.  Will continue to follow up at next opportunity.  Kathyrn Drown Elisa Kutner, OTR/L 02/19/20, 4:53 PM

## 2020-02-19 NOTE — Progress Notes (Signed)
PROGRESS NOTE    [name] [name]   KXF:[id number]  DOB: December 06, 1945  PCP: Ria Bush, MD    DOA: 02/14/2020 LOS: 4   Brief Narrative   [name] [name]  is a 74 y.o. Caucasian female with a medical history of COPD (uses 2 L/min nocturnal oxygen at baseline), recurrent UTI's followed by urology (on prophylactic trimethoprim), hypertension, dyslipidemia, hypothyroidism, CHF, peripheral vascular disease, who presented to the ED on 02/14/20 with generalized weakness, worsening dyspnea and increased home oxygen requirements since her recent kyphoplasty for T10 compression fracture on 01/28/09.  Also with reports of mild wheezing on presentation.  She has been needing 2-3 L/min continuous oxygen since the procedure.   She has been taking tramadol, oxycodone and Valium together with trazodone and melatonin at night.  Painkillers and muscle relaxants were prescribed after her kyphoplasty.  Patient's daughter reports that some of these medications are "too much" for the patient.  In the ED, vitals initially stable, but later became hypotensive at 85/51.  Oxygen saturation initially 89-91%, improved to 100% on 2 L/min oxygen.  Chest xray showed low lung volumes and bibasilar atelectasis.  CTA chest ruled out pulmonary embolism, but also showed bibasilar atelectasis and a new pulmonary nodule thought to be post-inflammatory, and changes related to her T10 kyphoplasty.  Patient was treated for pain and given Duoneb in the ED.  Admitted to hospitalist service for further management.   Daughter reports patient has become profoundly weak overall since the procedure, is now requiring much more assistance than at her baseline.  PT and OT evaluated patient and recommend CIR.       Assessment & Plan   Principal Problem:   Acute on chronic respiratory failure with hypoxia (HCC) Active Problems:   Hypertension   Peripheral vascular disease (HCC)   COPD (chronic obstructive pulmonary disease) (HCC)  Hyperlipidemia   Insomnia   Hypothyroidism (acquired)   Compression fracture of body of thoracic vertebra (HCC)   Chronic back pain   History of recurrent UTIs   Generalized Weakness - secondary to recent back pain limiting her mobility and tolerance for activity.   --SNF rehab  Acute on chronic respiratory failure with hypoxia, resolved  - multifactorial, due to shallow inspirations due to back pain s/p fracture and kyphoplasty, sedating medications and possibly mild COPD exacerbation.  Baseline O2 need is 2 L/min at night.  Requiring 3 L/min continuous on presentation. --supplemental O2 as needed, maintain O2 sat > 90% --incentive spirometer, encourage frequent use when pain controlled --holding Norco and Oxy - too sedating for patient --monitor for over-sedation  COPD - stable, no exacerbation present on admission, as above.  Appears to use only albulterol HFA PRN at home.  Not wheezing and weaned off oxygen, will stop steroids. Clinically patient's COPD is stable and not exacerbated.   --Duonebs PRN --Mucinex BID --home Albuterol inhaler  Compression fracture of body of thoracic vertebra s/p recent T9 kyphoplasty.   Chronic back pain Continues to have pain and muscle spasms that limit deep inspirations. --IV Toradol PRN to avoid sedation --continueTramadol PRN --start flexeril 10 mg BID scheduled  Hypotension - present on admission, resolved.  Off IV fluids.  History of Hypertension / Edema - diuretics are held for now.  Has lower DBP's would use diuretics with caution.   Peripheral vascular disease - continue ASA and Plavix.  History of Recurrent UTI's - follows with urology, on prophylactic trimethoprim.  Continued.    Hyperlipidemia - continue atorvastatin  Depression/Anxiety/Insomnia - continue  Zoloft, trazodone.  Hold Valium.  Hypothyroidism (acquired) - continue Synthroid.  GERD - continue PPI   Obesity: Body mass index is 36.51 kg/m.   Complicates overall  care and prognosis.  DVT prophylaxis: Lovenox  Diet:  Diet Orders (From admission, onward)    Start     Ordered   02/14/20 2053  Diet Heart Room service appropriate? Yes; Fluid consistency: Thin  Diet effective now    Question Answer Comment  Room service appropriate? Yes   Fluid consistency: Thin      02/14/20 2055            Code Status: Full Code    Subjective    Pt complained of post-nasal drip which was causing her to cough, and had some pain over her right lateral trunk area.  No fever, dyspnea, abdominal pain, N/V/D.  Pt and daughter finally made the decision to go to SNF rehab.    Disposition Plan & Communication   Status is: Inpatient  Remains inpatient appropriate because:Unsafe d/c plan.     Dispo: The patient is from: Home              Anticipated d/c is to: SNF              Anticipated d/c date is: whenever bed available              Patient currently is medically stable for discharge.   Family Communication: daughter updated at the bedside today   Consults, Procedures, Significant Events   Consultants:   none  Procedures:   none  Antimicrobials:   Rocephin 5/21>>5/22    Objective   Vitals:   02/19/20 0509 02/19/20 0528 02/19/20 0948 02/19/20 1152  BP: 96/61 (!) 117/54 (!) 116/55 127/74  Pulse: 78 90 93 87  Resp: 16 20 17 20   Temp: 97.6 F (36.4 C) 97.9 F (36.6 C) 97.7 F (36.5 C) 98 F (36.7 C)  TempSrc: Oral Oral Oral Oral  SpO2: 100% 98% 100% 99%  Weight:      Height:        Intake/Output Summary (Last 24 hours) at 02/19/2020 1802 Last data filed at 02/19/2020 1432 Gross per 24 hour  Intake 360 ml  Output --  Net 360 ml   Filed Weights   02/14/20 1351 02/15/20 0100  Weight: 77.1 kg 82 kg    Physical Exam:  Constitutional: NAD, AAOx3 HEENT: conjunctivae and lids normal, EOMI CV: RRR no M,R,G. Distal pulses +2.  No cyanosis.   RESP: CTA B/L, normal respiratory effort  GI: +BS, NTND Extremities: No effusions,  edema, or tenderness in BLE SKIN: warm, dry and intact Neuro: II - XII grossly intact.  Sensation intact Psych: Normal mood and affect.  Appropriate judgement and reason    Labs   Data Reviewed: I have personally reviewed following labs and imaging studies  CBC: Recent Labs  Lab 02/14/20 1356 02/15/20 0419 02/18/20 1207  WBC 10.5 6.5 8.8  HGB 12.2 11.1* 12.9  HCT 38.4 35.0* 41.5  MCV 93.7 92.6 93.5  PLT 208 169 232   Basic Metabolic Panel: Recent Labs  Lab 02/14/20 1356 02/15/20 0419 02/18/20 1207  NA 139 139 137  K 4.6 4.5 4.4  CL 104 106 104  CO2 27 27 26   GLUCOSE 98 176* 108*  BUN 18 18 25*  CREATININE 1.00 1.01* 1.16*  CALCIUM 8.7* 8.7* 8.6*   GFR: Estimated Creatinine Clearance: 40 mL/min (A) (by C-G formula based on SCr of  1.16 mg/dL (H)). Liver Function Tests: No results for input(s): AST, ALT, ALKPHOS, BILITOT, PROT, ALBUMIN in the last 168 hours. No results for input(s): LIPASE, AMYLASE in the last 168 hours. No results for input(s): AMMONIA in the last 168 hours. Coagulation Profile: No results for input(s): INR, PROTIME in the last 168 hours. Cardiac Enzymes: No results for input(s): CKTOTAL, CKMB, CKMBINDEX, TROPONINI in the last 168 hours. BNP (last 3 results) Recent Labs    11/05/19 1438  PROBNP 32.0   HbA1C: No results for input(s): HGBA1C in the last 72 hours. CBG: No results for input(s): GLUCAP in the last 168 hours. Lipid Profile: No results for input(s): CHOL, HDL, LDLCALC, TRIG, CHOLHDL, LDLDIRECT in the last 72 hours. Thyroid Function Tests: No results for input(s): TSH, T4TOTAL, FREET4, T3FREE, THYROIDAB in the last 72 hours. Anemia Panel: No results for input(s): VITAMINB12, FOLATE, FERRITIN, TIBC, IRON, RETICCTPCT in the last 72 hours. Sepsis Labs: No results for input(s): PROCALCITON, LATICACIDVEN in the last 168 hours.  Recent Results (from the past 240 hour(s))  SARS Coronavirus 2 by RT PCR (hospital order, performed in  [facility] hospital lab) Nasopharyngeal Nasopharyngeal Swab     Status: None   Collection Time: 02/14/20  8:14 PM   Specimen: Nasopharyngeal Swab  Result Value Ref Range Status   SARS Coronavirus 2 NEGATIVE NEGATIVE Final    Comment: (NOTE) SARS-CoV-2 target nucleic acids are NOT DETECTED. The SARS-CoV-2 RNA is generally detectable in upper and lower respiratory specimens during the acute phase of infection. The lowest concentration of SARS-CoV-2 viral copies this assay can detect is 250 copies / mL. A negative result does not preclude SARS-CoV-2 infection and should not be used as the sole basis for treatment or other patient management decisions.  A negative result may occur with improper specimen collection / handling, submission of specimen other than nasopharyngeal swab, presence of viral mutation(s) within the areas targeted by this assay, and inadequate number of viral copies (<250 copies / mL). A negative result must be combined with clinical observations, patient history, and epidemiological information. Fact Sheet for Patients:   BoilerBrush.com.cy Fact Sheet for Healthcare Providers: https://pope.com/ This test is not yet approved or cleared  by the [place] FDA and has been authorized for detection and/or diagnosis of SARS-CoV-2 by FDA under an Emergency Use Authorization (EUA).  This EUA will remain in effect (meaning this test can be used) for the duration of the COVID-19 declaration under Section 564(b)(1) of the Act, 21 U.S.C. section 360bbb-3(b)(1), unless the authorization is terminated or revoked sooner. Performed at [facility], [address] [place], [place] [address]   Urine Culture     Status: Abnormal (Preliminary result)   Collection Time: 02/18/20 12:23 PM   Specimen: Urine, Random  Result Value Ref Range Status   Specimen Description   Final    URINE, RANDOM Performed at [facility], [address] [place], [place] [address]    Special Requests   Final    NONE Performed at [facility], [address] [place], [place] [address]    Culture (A)  Final    20,000 COLONIES/mL GRAM NEGATIVE RODS SUSCEPTIBILITIES TO FOLLOW Performed at [facility], [address] [place], [place] [address]    Report Status PENDING  Incomplete      Imaging Studies   DG Chest Port 1 View  Result Date: 02/18/2020 CLINICAL DATA:  Tachycardia EXAM: PORTABLE CHEST 1 VIEW COMPARISON:  02/14/2019 FINDINGS:  Cardiac shadow is enlarged but stable. Changes of prior vertebral augmentation are seen. Aortic calcifications are noted. Scattered calcified granulomas are seen. Minimal basilar atelectasis is again noted. No bony abnormality is seen. IMPRESSION: Stable appearance of the chest from 5 days previous. Electronically Signed   By: Alcide Clever M.D.   On: 02/18/2020 10:46     Medications   Scheduled Meds: . vitamin C  500 mg Oral Daily  . aspirin EC  81 mg Oral Daily  . atorvastatin  40 mg Oral Daily  . cholecalciferol  5,000 Units Oral Daily  . clopidogrel  75 mg Oral Daily  . cyclobenzaprine  10 mg Oral BID  . enoxaparin (LOVENOX) injection  40 mg Subcutaneous Q24H  . gabapentin  100 mg Oral QHS  . guaiFENesin  600 mg Oral BID  . levothyroxine  50 mcg Oral QAC breakfast  . multivitamin with minerals  1 tablet Oral Daily  . pantoprazole  40 mg Oral Daily  . sertraline  100 mg Oral Daily  . traZODone  100 mg Oral QHS  . trimethoprim  100 mg Oral Daily  . zinc sulfate  220 mg Oral Daily   Continuous Infusions:     LOS: 4 days      Darlin Priestly, MD [facility]  02/19/2020, 6:02 PM

## 2020-02-19 NOTE — Progress Notes (Signed)
Physical Therapy Treatment Patient Details Name: [name] [name] MRN: [id number] DOB: 01-Nov-1945 Today's Date: 02/19/2020    History of Present Illness 74 yo female with onset of falls and SOB was admitted and noted hypotension and cleared for PE.  Has new findings of T10 vertebral fracture, but had a kyphoplasty on T9.  Noted susp UTI, low BP.  PMHx:  PN, osteoporosis, stroke, angioplasty R MCA, COPD, atherosclerosis, CHF, PVD, OA    PT Comments    Pt alert, agreeable to PT. Reported back spasms today, pain 2/10. Pt with improved mobility this session, HR WFLs (90s-110s). Unclear readings while ambulating on room air for spO2, but at rest in 90s. Rolling with CGA, sidelying to sit with minA. Sit <> Stand with RW and CGA several times during session. Ambulated ~67ft x2 bouts with RW and CGA, intermittent cueing for posture. Current recommendation remains appropriate.     Follow Up Recommendations  CIR     Equipment Recommendations  None recommended by PT    Recommendations for Other Services OT consult     Precautions / Restrictions Precautions Precautions: Fall;Back Precaution Booklet Issued: No Precaution Comments: reviewed precautions Restrictions Weight Bearing Restrictions: No    Mobility  Bed Mobility Overal bed mobility: Needs Assistance Bed Mobility: Sit to Sidelying Rolling: Min guard Sidelying to sit: Min assist;HOB elevated          Transfers Overall transfer level: Needs assistance Equipment used: Rolling walker (2 wheeled) Transfers: Sit to/from Stand Sit to Stand: Min guard            Ambulation/Gait Ambulation/Gait assistance: Min guard Gait Distance (Feet): (44ft and then 65ft after seated rest break) Assistive device: Rolling walker (2 wheeled)   Gait velocity: reduced   General Gait Details: Pt with improved HR (90-110s with ambulation)   Stairs             Wheelchair Mobility    Modified Rankin (Stroke Patients Only)        Balance Overall balance assessment: Needs assistance Sitting-balance support: Feet supported Sitting balance-Leahy Scale: Fair     Standing balance support: Bilateral upper extremity supported;During functional activity Standing balance-Leahy Scale: Fair                              Cognition Arousal/Alertness: Awake/alert Behavior During Therapy: WFL for tasks assessed/performed Overall Cognitive Status: Within Functional Limits for tasks assessed                                        Exercises General Exercises - Lower Extremity Ankle Circles/Pumps: AROM;Strengthening;Both;15 reps Long Arc Quad: AROM;Strengthening;Both;10 reps Heel Slides: AROM;Strengthening;Both;15 reps Hip ABduction/ADduction: AROM;Strengthening;Both;15 reps Straight Leg Raises: AROM;Strengthening;Both;15 reps    General Comments        Pertinent Vitals/Pain Pain Assessment: 0-10 Pain Score: 2  Pain Location: back Pain Descriptors / Indicators: Guarding;Grimacing;Aching;Spasm Pain Intervention(s): Limited activity within patient's tolerance;Monitored during session;Premedicated before session;Repositioned    Home Living                      Prior Function            PT Goals (current goals can now be found in the care plan section) Progress towards PT goals: Progressing toward goals    Frequency    7X/week  PT Plan Current plan remains appropriate    Co-evaluation              AM-PAC PT "6 Clicks" Mobility   Outcome Measure  Help needed turning from your back to your side while in a flat bed without using bedrails?: A Little Help needed moving from lying on your back to sitting on the side of a flat bed without using bedrails?: A Little Help needed moving to and from a bed to a chair (including a wheelchair)?: A Little Help needed standing up from a chair using your arms (e.g., wheelchair or bedside chair)?: A Little Help  needed to walk in hospital room?: A Little Help needed climbing 3-5 steps with a railing? : Total 6 Click Score: 16    End of Session Equipment Utilized During Treatment: Gait belt Activity Tolerance: Patient tolerated treatment well Patient left: in chair;with call bell/phone within reach;with chair alarm set;with family/visitor present Nurse Communication: Mobility status PT Visit Diagnosis: Unsteadiness on feet (R26.81);Muscle weakness (generalized) (M62.81);History of falling (Z91.81)     Time: 5686-1683 PT Time Calculation (min) (ACUTE ONLY): 27 min  Charges:  $Therapeutic Exercise: 23-37 mins                    Olga Coaster PT, DPT 4:05 PM,02/19/20

## 2020-02-20 DIAGNOSIS — I350 Nonrheumatic aortic (valve) stenosis: Secondary | ICD-10-CM | POA: Diagnosis not present

## 2020-02-20 DIAGNOSIS — M5489 Other dorsalgia: Secondary | ICD-10-CM | POA: Diagnosis not present

## 2020-02-20 DIAGNOSIS — D649 Anemia, unspecified: Secondary | ICD-10-CM | POA: Diagnosis not present

## 2020-02-20 DIAGNOSIS — E039 Hypothyroidism, unspecified: Secondary | ICD-10-CM | POA: Diagnosis not present

## 2020-02-20 DIAGNOSIS — I5032 Chronic diastolic (congestive) heart failure: Secondary | ICD-10-CM | POA: Diagnosis not present

## 2020-02-20 DIAGNOSIS — J449 Chronic obstructive pulmonary disease, unspecified: Secondary | ICD-10-CM | POA: Diagnosis not present

## 2020-02-20 DIAGNOSIS — F418 Other specified anxiety disorders: Secondary | ICD-10-CM | POA: Diagnosis not present

## 2020-02-20 DIAGNOSIS — I509 Heart failure, unspecified: Secondary | ICD-10-CM | POA: Diagnosis not present

## 2020-02-20 DIAGNOSIS — I10 Essential (primary) hypertension: Secondary | ICD-10-CM | POA: Diagnosis not present

## 2020-02-20 DIAGNOSIS — K219 Gastro-esophageal reflux disease without esophagitis: Secondary | ICD-10-CM | POA: Diagnosis not present

## 2020-02-20 DIAGNOSIS — M8000XA Age-related osteoporosis with current pathological fracture, unspecified site, initial encounter for fracture: Secondary | ICD-10-CM | POA: Diagnosis not present

## 2020-02-20 DIAGNOSIS — R498 Other voice and resonance disorders: Secondary | ICD-10-CM | POA: Diagnosis not present

## 2020-02-20 DIAGNOSIS — J9621 Acute and chronic respiratory failure with hypoxia: Secondary | ICD-10-CM | POA: Diagnosis not present

## 2020-02-20 DIAGNOSIS — M6281 Muscle weakness (generalized): Secondary | ICD-10-CM | POA: Diagnosis not present

## 2020-02-20 DIAGNOSIS — E569 Vitamin deficiency, unspecified: Secondary | ICD-10-CM | POA: Diagnosis not present

## 2020-02-20 DIAGNOSIS — I739 Peripheral vascular disease, unspecified: Secondary | ICD-10-CM | POA: Diagnosis not present

## 2020-02-20 DIAGNOSIS — I6523 Occlusion and stenosis of bilateral carotid arteries: Secondary | ICD-10-CM | POA: Diagnosis not present

## 2020-02-20 DIAGNOSIS — J432 Centrilobular emphysema: Secondary | ICD-10-CM | POA: Diagnosis not present

## 2020-02-20 DIAGNOSIS — R0602 Shortness of breath: Secondary | ICD-10-CM | POA: Diagnosis not present

## 2020-02-20 DIAGNOSIS — R262 Difficulty in walking, not elsewhere classified: Secondary | ICD-10-CM | POA: Diagnosis not present

## 2020-02-20 DIAGNOSIS — R488 Other symbolic dysfunctions: Secondary | ICD-10-CM | POA: Diagnosis not present

## 2020-02-20 DIAGNOSIS — E782 Mixed hyperlipidemia: Secondary | ICD-10-CM | POA: Diagnosis not present

## 2020-02-20 DIAGNOSIS — E785 Hyperlipidemia, unspecified: Secondary | ICD-10-CM | POA: Diagnosis not present

## 2020-02-20 LAB — BASIC METABOLIC PANEL
Anion gap: 5 (ref 5–15)
BUN: 27 mg/dL — ABNORMAL HIGH (ref 8–23)
CO2: 26 mmol/L (ref 22–32)
Calcium: 8.3 mg/dL — ABNORMAL LOW (ref 8.9–10.3)
Chloride: 107 mmol/L (ref 98–111)
Creatinine, Ser: 0.95 mg/dL (ref 0.44–1.00)
GFR calc Af Amer: >60 mL/min (ref >60)
GFR calc non Af Amer: 59 mL/min — ABNORMAL LOW (ref >60)
Glucose, Bld: 98 mg/dL (ref 70–99)
Potassium: 4.3 mmol/L (ref 3.5–5.1)
Sodium: 138 mmol/L (ref 135–145)

## 2020-02-20 LAB — URINE CULTURE: Culture: 20000 — AB

## 2020-02-20 LAB — BLOOD GAS, VENOUS
Acid-Base Excess: 2 mmol/L (ref 0.0–2.0)
Bicarbonate: 29.4 mmol/L — ABNORMAL HIGH (ref 20.0–28.0)
O2 Saturation: 36.8 %
Patient temperature: 37
pCO2, Ven: 57 mmHg (ref 44.0–60.0)
pH, Ven: 7.32 (ref 7.250–7.430)

## 2020-02-20 LAB — CBC
HCT: 37.4 % (ref 36.0–46.0)
Hemoglobin: 11.9 g/dL — ABNORMAL LOW (ref 12.0–15.0)
MCH: 29.7 pg (ref 26.0–34.0)
MCHC: 31.8 g/dL (ref 30.0–36.0)
MCV: 93.3 fL (ref 80.0–100.0)
Platelets: 198 10*3/uL (ref 150–400)
RBC: 4.01 MIL/uL (ref 3.87–5.11)
RDW: 13.5 % (ref 11.5–15.5)
WBC: 9.5 10*3/uL (ref 4.0–10.5)
nRBC: 0 % (ref 0.0–0.2)

## 2020-02-20 LAB — SARS CORONAVIRUS 2 BY RT PCR (HOSPITAL ORDER, PERFORMED IN ~~LOC~~ HOSPITAL LAB): SARS Coronavirus 2: NEGATIVE

## 2020-02-20 LAB — MAGNESIUM: Magnesium: 2.3 mg/dL (ref 1.7–2.4)

## 2020-02-20 MED ORDER — TRAMADOL HCL 50 MG PO TABS: 50.0000 mg | ORAL_TABLET | Freq: Two times a day (BID) | ORAL | 0 refills | Status: DC | PRN

## 2020-02-20 MED ORDER — PANTOPRAZOLE SODIUM 40 MG PO TBEC: 40.0000 mg | DELAYED_RELEASE_TABLET | Freq: Every day | ORAL | Status: DC

## 2020-02-20 MED ORDER — CYCLOBENZAPRINE HCL 10 MG PO TABS: 10.0000 mg | ORAL_TABLET | Freq: Three times a day (TID) | ORAL | 0 refills | Status: DC | PRN

## 2020-02-20 MED ORDER — TRAZODONE HCL 100 MG PO TABS: 100.0000 mg | ORAL_TABLET | Freq: Every day | ORAL | Status: DC

## 2020-02-20 NOTE — TOC Progression Note (Addendum)
Transition of Care [facility]) - Progression Note    Patient Details  Name: [name] [name] MRN: [id number] Date of Birth: 1946-01-12  Transition of Care [facility]) CM/SW Contact  Margarito Liner, LCSW Phone Number: 02/20/2020, 8:37 AM  Clinical Narrative: Per TOC handoff from yesterday, "Wants [facility], sent bed request to them and it was accepted. Tammy with facility will start insurance auth."    10:47 am: Insurance authorization approved. Rapid COVID test pending.  Expected Discharge Plan: IP Rehab Facility Barriers to Discharge: Insurance Authorization  Expected Discharge Plan and Services Expected Discharge Plan: IP Rehab Facility     Post Acute Care Choice: IP Rehab Living arrangements for the past 2 months: Single Family Home                                       Social Determinants of Health (SDOH) Interventions    Readmission Risk Interventions Readmission Risk Prevention Plan 12/26/2019  Transportation Screening Complete  PCP or Specialist Appt within 5-7 Days Complete  Home Care Screening Complete  Medication Review (RN CM) Complete  Some recent data might be hidden

## 2020-02-20 NOTE — Progress Notes (Addendum)
Physical Therapy Treatment Patient Details Name: [name] [name] MRN: [id number] DOB: 10/23/1945 Today's Date: 02/20/2020    History of Present Illness 74 yo female with onset of falls and SOB was admitted and noted hypotension and cleared for PE.  Has new findings of T10 vertebral fracture, but had a kyphoplasty on T9.  Noted susp UTI, low BP.  PMHx:  PN, osteoporosis, stroke, angioplasty R MCA, COPD, atherosclerosis, CHF, PVD, OA    PT Comments    Pt ready for session.  To EOB with rail and min guard, increased time.  Stood with min a x 1 and is able to complete standing ex and transition to chair.  After short rest, she is able to progress gait to door and back with RW and min guard with overall good balance and safety awareness.  She continues to report she is not at her baseline with mobility and feels she needs continued rehab to return home.  She has chosen [facility] per [employer] notes and is not interested in CIR at this time.  Will adjust discharge recommendations to reflect.  Remained in recliner for breakfast.  Of note.  Pt on 1 LPM O2 at rest and with mobility.  Stated she only uses O2 at night at home.  Sats remained >94% with mobility.  Telemonitor readings dip into 70's but finger pulse ox reads steady in 90's with no SOB or evidence of distress.  Attribute low readings to poor contact with mobility and gripping walker. RN aware.    Follow Up Recommendations  SNF     Equipment Recommendations  None recommended by PT    Recommendations for Other Services       Precautions / Restrictions Precautions Precautions: Fall;Back Precaution Booklet Issued: No Precaution Comments: reviewed precautions Restrictions Weight Bearing Restrictions: No    Mobility  Bed Mobility Overal bed mobility: Needs Assistance Bed Mobility: Supine to Sit     Supine to sit: Supervision;Min guard        Transfers Overall transfer level: Needs assistance Equipment used: Rolling walker  (2 wheeled) Transfers: Sit to/from Stand Sit to Stand: Min guard;Min assist         General transfer comment: minA for initial clearance of buttocks from bed  Ambulation/Gait Ambulation/Gait assistance: Min guard Gait Distance (Feet): 35 Feet Assistive device: Rolling walker (2 wheeled) Gait Pattern/deviations: Step-through pattern;Decreased step length - right;Decreased step length - left Gait velocity: reduced   General Gait Details: vitals steady with mobility/gait.  cues to stand into walker.  uses it only for balance and not support.   Stairs             Wheelchair Mobility    Modified Rankin (Stroke Patients Only)       Balance Overall balance assessment: Needs assistance Sitting-balance support: Feet supported Sitting balance-Leahy Scale: Good     Standing balance support: Bilateral upper extremity supported;During functional activity Standing balance-Leahy Scale: Fair Standing balance comment: uses walker for balance                            Cognition Arousal/Alertness: Awake/alert Behavior During Therapy: WFL for tasks assessed/performed Overall Cognitive Status: Within Functional Limits for tasks assessed                                        Exercises Other Exercises Other Exercises:  standing marching in place and hee raises x 10 with light walker support    General Comments        Pertinent Vitals/Pain Pain Assessment: No/denies pain    Home Living                      Prior Function            PT Goals (current goals can now be found in the care plan section) Progress towards PT goals: Progressing toward goals    Frequency    7X/week      PT Plan Discharge plan needs to be updated    Co-evaluation              AM-PAC PT "6 Clicks" Mobility   Outcome Measure  Help needed turning from your back to your side while in a flat bed without using bedrails?: None Help needed moving  from lying on your back to sitting on the side of a flat bed without using bedrails?: A Little Help needed moving to and from a bed to a chair (including a wheelchair)?: A Little Help needed standing up from a chair using your arms (e.g., wheelchair or bedside chair)?: A Little Help needed to walk in hospital room?: A Little Help needed climbing 3-5 steps with a railing? : A Lot 6 Click Score: 18    End of Session Equipment Utilized During Treatment: Gait belt Activity Tolerance: Patient tolerated treatment well Patient left: in chair;with call bell/phone within reach;with chair alarm set Nurse Communication: Mobility status;Other (comment)       Time: 5681-2751 PT Time Calculation (min) (ACUTE ONLY): 24 min  Charges:  $Gait Training: 8-22 mins $Therapeutic Exercise: 8-22 mins                    Chesley Noon, PTA 02/20/20, 8:48 AM

## 2020-02-20 NOTE — TOC Transition Note (Signed)
Transition of Care [facility]) - CM/SW Discharge Note   Patient Details  Name: [name] [name] MRN: [id number] Date of Birth: 01-07-1946  Transition of Care [facility]) CM/SW Contact:  Margarito Liner, LCSW Phone Number: 02/20/2020, 2:47 PM   Clinical Narrative: Patient has orders to discharge to [facility] today. Nurse will call report to [phone] (Room 808). EMS transport has been arranged. No further concerns. CSW signing off.    Final next level of care: Skilled Nursing Facility Barriers to Discharge: Barriers Resolved   Patient Goals and CMS Choice   CMS Medicare.gov Compare Post Acute Care list provided to:: Patient Choice offered to / list presented to : Patient, Adult Children  Discharge Placement PASRR number recieved: 02/17/20            Patient chooses bed at: [facility] Patient to be transferred to facility by: EMS Name of family member notified: Jonette Pesa Patient and family notified of of transfer: 02/20/20  Discharge Plan and Services     Post Acute Care Choice: IP Rehab                               Social Determinants of Health (SDOH) Interventions     Readmission Risk Interventions Readmission Risk Prevention Plan 12/26/2019  Transportation Screening Complete  PCP or Specialist Appt within 5-7 Days Complete  Home Care Screening Complete  Medication Review (RN CM) Complete  Some recent data might be hidden

## 2020-02-20 NOTE — Progress Notes (Signed)
OT Cancellation Note  Patient Details Name: KEIASHA DIEP MRN: [id number] DOB: 03/19/1946   Cancelled Treatment:    Reason Eval/Treat Not Completed: Patient declined, no reason specified   Attempted to engage pt in OT tx, but pt politely declined stating she just worked with PT.  Will continue to follow up at next opportunity.  Kathyrn Drown Jaiyon Wander, OTR/L 02/20/20, 10:51 AM

## 2020-02-20 NOTE — Discharge Summary (Signed)
Physician Discharge Summary   [name] [name]  female DOB: 01-06-1946  ZOX:[id number]  PCP: [name], MD  Admit date: 02/14/2020 Discharge date: 02/20/2020  Admitted From: home Disposition:  SNF CODE STATUS: Full code  Discharge Instructions    Diet - low sodium heart healthy   Complete by: As directed        Hospital Course:  For full details, please see H&P, progress notes, consult notes and ancillary notes.  Briefly,  HattieHutchinsis a73 y.o.Caucasian femalewith a medical history ofCOPD (uses 2 L/min nocturnal oxygen at baseline), recurrent UTI's followed by urology (on prophylactic trimethoprim), hypertension, dyslipidemia, hypothyroidism, CHF, peripheral vascular disease, who presented to the ED on 02/14/20 with generalized weakness, worsening dyspnea and increased home oxygen requirements since her recent kyphoplasty for T10 compression fracture on 01/28/09.    She has been needing 2-3 L/min continuous oxygen since the procedure.  She has been taking tramadol, oxycodone and Valium together with trazodone and melatonin at night. Painkillers and muscle relaxants were prescribed after her kyphoplasty.  Patient's daughter reported that some of these medications were "too much" for the patient.  In the ED, vitals initially stable, but later became hypotensive at 85/51.  Oxygen saturation initially 89-91%, improved to 100% on 2 L/min oxygen.  Chest xray showed low lung volumes and bibasilar atelectasis.  CTA chest ruled out pulmonary embolism, but also showed bibasilar atelectasis and a new pulmonary nodule thought to be post-inflammatory, and changes related to her T10 kyphoplasty.    Generalized Weakness  Secondary to recent back pain limiting her mobility and tolerance for activity.  Daughter reported patient has become profoundly weak overall since the procedure, is now requiring much more assistance than at her baseline.  PT and OT evaluated patient and recommend  SNF rehab.  Acute on chronic respiratory failure with hypoxia, resolved  Multifactorial, due to shallow inspirations due to back pain s/p fracture and kyphoplasty, sedating medications.  Baseline O2 need is 2 L/min at night.  Requiring 3 L/min continuous on presentation but that has since resolved and now sating well on RA during the day. Pt was provided with incentive spirometer, encourage frequent use when pain controlled.  Sedating medications were held.  # Compression fracture of body of thoracic vertebra s/p recent T9 kyphoplasty.   # Chronic back pain Continues to have pain and muscle spasms that limits deep inspirations. Pt was ordered IV Toradol PRN to avoid sedation.  Oxycodone and Valium were d/c'ed.  Continued Tramadol PRN.  Flexeril 10 mg PRN for muscle spasm.  COPD - stable, no exacerbation Appears to use only albulterol HFA PRN at home.  Started on steroid on presentation, but since d/c'ed when clinically determined pt was not in acute exacerbation.  Not wheezing and weaned off oxygen.  Hypotension - present on admission, resolved.   Likely due to too much sedatives taken at home.    History of Hypertension, not currently active Hx of Edema  Takes diuretic PRN at home.  BP wnl without BP meds.   Peripheral vascular disease continued ASA and Plavix.  History of Recurrent UTI's follows with urology, on prophylactic trimethoprim.  [name]    Hyperlipidemia continued atorvastatin  Depression/Anxiety/Insomnia continued Zoloft, trazodone.  Hold Valium.  Hypothyroidism (acquired) continued Synthroid.  GERD continued PPI   Obesity: Body mass index is 36.51 kg/m.   Complicates overall care and prognosis.   Discharge Diagnoses:  Principal Problem:   Acute on chronic respiratory failure with hypoxia (HCC) Active Problems:  Hypertension   Peripheral vascular disease (HCC)   COPD (chronic obstructive pulmonary disease) (HCC)   Hyperlipidemia   Insomnia    Hypothyroidism (acquired)   Compression fracture of body of thoracic vertebra (HCC)   Chronic back pain   History of recurrent UTIs    Discharge Instructions:  Allergies as of 02/20/2020      Reactions   Doxycycline Nausea Only, Other (See Comments)   Sig GI upset   Fluticasone-salmeterol Anxiety      Medication List    STOP taking these medications   diazepam 5 MG tablet Commonly known as: VALIUM   HYDROcodone-acetaminophen 5-325 MG tablet Commonly known as: NORCO/VICODIN   methocarbamol 500 MG tablet Commonly known as: ROBAXIN   oxyCODONE 5 MG immediate release tablet Commonly known as: Oxy IR/ROXICODONE   sulfamethoxazole-trimethoprim 400-80 MG tablet Commonly known as: BACTRIM     TAKE these medications   acetaminophen 500 MG tablet Commonly known as: TYLENOL Take 1 tablet (500 mg total) by mouth every 6 (six) hours as needed for headache.   albuterol 108 (90 Base) MCG/ACT inhaler Commonly known as: VENTOLIN HFA INHALE 2 PUFFS INTO THE LUNGS EVERY 6 (SIX) HOURS AS NEEDED FOR WHEEZING OR SHORTNESS OF BREATH. What changed: when to take this   alendronate 70 MG tablet Commonly known as: FOSAMAX TAKE 1 TABLET WEEKLY. TAKE WITH A FULL GLASS OF WATER ON AN EMPTY STOMACH. What changed:   how much to take  how to take this  when to take this  additional instructions   aspirin 81 MG EC tablet Commonly known as: Aspirin 81 Take 1 tablet (81 mg total) by mouth daily. Swallow whole.   atorvastatin 40 MG tablet Commonly known as: LIPITOR TAKE 1 TABLET EVERY DAY   AZO CRANBERRY URINARY TRACT PO Take 1 tablet by mouth in the morning and at bedtime.   clopidogrel 75 MG tablet Commonly known as: PLAVIX TAKE 1 TABLET (75 MG TOTAL) BY MOUTH DAILY.   cyclobenzaprine 10 MG tablet Commonly known as: FLEXERIL Take 1 tablet (10 mg total) by mouth 3 (three) times daily as needed for muscle spasms.   furosemide 40 MG tablet Commonly known as: LASIX Take 20 mg  by mouth daily as needed for fluid. Weight every morning before given   gabapentin 100 MG capsule Commonly known as: NEURONTIN TAKE 1 CAPSULE (100 MG TOTAL) BY MOUTH AT BEDTIME.   levothyroxine 50 MCG tablet Commonly known as: SYNTHROID TAKE 1 TABLET  DAILY BEFORE BREAKFAST.   Melatonin 5 MG Chew Chew 5 mg by mouth daily. Gummies   MULTIVITAMIN PO Take 1 tablet by mouth daily. Gummie   ondansetron 4 MG tablet Commonly known as: Zofran Take 1 tablet (4 mg total) by mouth every 8 (eight) hours as needed for nausea or vomiting.   OXYGEN Inhale 2 L/min into the lungs See admin instructions. Inhale 2 L/min of oxygen at bedtime and during the day as needed for shortness of breath   pantoprazole 40 MG tablet Commonly known as: PROTONIX Take 1 tablet (40 mg total) by mouth daily.   Potassium Chloride ER 20 MEQ Tbcr TAKE 2 TABLETS BY MOUTH AS NEEDED (WHENEVER  YOU  TAKE  LASIX) What changed: See the new instructions.   sertraline 100 MG tablet Commonly known as: ZOLOFT TAKE 1 TABLET EVERY DAY   traMADol 50 MG tablet Commonly known as: ULTRAM Take 1-2 tablets (50-100 mg total) by mouth 2 (two) times daily as needed for moderate pain.  traZODone 100 MG tablet Commonly known as: DESYREL Take 1 tablet (100 mg total) by mouth at bedtime.   trimethoprim 100 MG tablet Commonly known as: TRIMPEX Take 1 tablet (100 mg total) by mouth daily. Take daily to prevent UTI   vitamin C 250 MG tablet Commonly known as: ASCORBIC ACID Take 500 mg by mouth daily. Gummies   Vitamin D3 125 MCG (5000 UT) Caps Take 5,000 Units by mouth daily. Gummies   Zinc 30 MG Tabs Take 30 mg by mouth daily. Gummies        Contact information for follow-up providers    [name], MD. Schedule an appointment as soon as possible for a visit in 1 week(s).   Specialty: Family Medicine Contact information: [address] [place] [address] [phone]        Antonieta Iba,  MD .   Specialty: Cardiology Contact information: [address] [place] [address] [phone]            Contact information for after-discharge care    Destination    [facility] Preferred SNF .   Service: Skilled Nursing Contact information: [address] [place] [address] [phone]                  Allergies  Allergen Reactions  . Doxycycline Nausea Only and Other (See Comments)    Sig GI upset  . Fluticasone-Salmeterol Anxiety     The results of significant diagnostics from this hospitalization (including imaging, microbiology, ancillary and laboratory) are listed below for reference.   Consultations:   Procedures/Studies: DG Chest 2 View  Result Date: 02/14/2020 CLINICAL DATA:  Weakness and shortness of breath. Evaluate for infiltrate/edema. EXAM: CHEST - 2 VIEW COMPARISON:  12/22/2019 FINDINGS: Shallow lung inflation. Heart is enlarged. There are is atelectasis in both lung bases. Less likely, the findings could be related to early infiltrate. Small LEFT pleural effusion. Remote rib fractures. Remote vertebral augmentation. Surgical clips are present in the UPPER abdomen. IMPRESSION: 1. Shallow lung inflation. 2. Bibasilar atelectasis. Electronically Signed   By: Norva Pavlov M.D.   On: 02/14/2020 16:14   DG Thoracic Spine 2 View  Result Date: 01/29/2020 CLINICAL DATA:  T10 kyphoplasty EXAM: THORACIC SPINE 2 VIEWS; DG C-ARM 1-60 MIN COMPARISON:  12/24/2019 FINDINGS: Four fluoroscopic images are obtained during the performance of the procedure and are provided for interpretation only. Images demonstrate wedge compression deformities at the thoracolumbar junction. Vertebral augmentation is performed, with no significant extravasation of the methylmethacrylate. Please refer to operative report for full description of procedure. FLUOROSCOPY TIME:  91.6 seconds IMPRESSION: 1. Intraoperative exam as above.  Electronically Signed   By: Sharlet Salina M.D.   On: 01/29/2020 17:56   CT Angio Chest PE W and/or Wo Contrast  Result Date: 02/14/2020 CLINICAL DATA:  Shortness of breath for 1 week EXAM: CT ANGIOGRAPHY CHEST WITH CONTRAST TECHNIQUE: Multidetector CT imaging of the chest was performed using the standard protocol during bolus administration of intravenous contrast. Multiplanar CT image reconstructions and MIPs were obtained to evaluate the vascular anatomy. CONTRAST:  59mL OMNIPAQUE IOHEXOL 350 MG/ML SOLN COMPARISON:  Chest x-ray from earlier in the same day CT from 12/22/2019. FINDINGS: Cardiovascular: Thoracic aorta demonstrates atherosclerotic calcification without aneurysmal dilatation or dissection. Coronary calcifications are seen. No significant cardiac enlargement is noted. Pulmonary artery shows a normal branching pattern. No intraluminal filling defect to suggest pulmonary embolism is seen. Mediastinum/Nodes: Thoracic inlet is within  normal limits. No hilar or mediastinal adenopathy is noted. The esophagus as visualized is within normal limits. Lungs/Pleura: Mild bibasilar atelectasis is seen. A focal 8 mm nodule is noted in the right lung base likely postinflammatory in nature is it was not seen on prior CT examination. The lungs are otherwise clear. No sizable effusion is seen. Upper Abdomen: Gallbladder has been surgically removed. No other focal abnormality is noted. Musculoskeletal: Degenerative changes of the thoracic spine are noted. Changes of prior vertebral augmentation are seen at T9 with evidence of a chronic compression deformity at T10. Review of the MIP images confirms the above findings. IMPRESSION: No evidence of pulmonary emboli. Mild bibasilar atelectatic changes. 8 mm nodule in the right lower lobe best seen on image number 48 of series 8. This is felt to be postinflammatory in nature as it was not seen on a recent CT from 2 months previous. Compression fractures at T9 and T10 with  augmentation at T9. Aortic Atherosclerosis (ICD10-I70.0). Electronically Signed   By: Alcide Clever M.D.   On: 02/14/2020 19:51   DG Chest Port 1 View  Result Date: 02/18/2020 CLINICAL DATA:  Tachycardia EXAM: PORTABLE CHEST 1 VIEW COMPARISON:  02/14/2019 FINDINGS: Cardiac shadow is enlarged but stable. Changes of prior vertebral augmentation are seen. Aortic calcifications are noted. Scattered calcified granulomas are seen. Minimal basilar atelectasis is again noted. No bony abnormality is seen. IMPRESSION: Stable appearance of the chest from 5 days previous. Electronically Signed   By: Alcide Clever M.D.   On: 02/18/2020 10:46   DG C-Arm 1-60 Min  Result Date: 01/29/2020 CLINICAL DATA:  T10 kyphoplasty EXAM: THORACIC SPINE 2 VIEWS; DG C-ARM 1-60 MIN COMPARISON:  12/24/2019 FINDINGS: Four fluoroscopic images are obtained during the performance of the procedure and are provided for interpretation only. Images demonstrate wedge compression deformities at the thoracolumbar junction. Vertebral augmentation is performed, with no significant extravasation of the methylmethacrylate. Please refer to operative report for full description of procedure. FLUOROSCOPY TIME:  91.6 seconds IMPRESSION: 1. Intraoperative exam as above. Electronically Signed   By: Sharlet Salina M.D.   On: 01/29/2020 17:56      Labs: BNP (last 3 results) Recent Labs    12/22/19 1243 12/24/19 0121 02/14/20 1359  BNP 136.0* 71.7 71.2   Basic Metabolic Panel: Recent Labs  Lab 02/14/20 1356 02/15/20 0419 02/18/20 1207 02/20/20 0425  NA 139 139 137 138  K 4.6 4.5 4.4 4.3  CL 104 106 104 107  CO2 27 27 26 26   GLUCOSE 98 176* 108* 98  BUN 18 18 25* 27*  CREATININE 1.00 1.01* 1.16* 0.95  CALCIUM 8.7* 8.7* 8.6* 8.3*  MG  --   --   --  2.3   Liver Function Tests: No results for input(s): AST, ALT, ALKPHOS, BILITOT, PROT, ALBUMIN in the last 168 hours. No results for input(s): LIPASE, AMYLASE in the last 168 hours. No  results for input(s): AMMONIA in the last 168 hours. CBC: Recent Labs  Lab 02/14/20 1356 02/15/20 0419 02/18/20 1207 02/20/20 0425  WBC 10.5 6.5 8.8 9.5  HGB 12.2 11.1* 12.9 11.9*  HCT 38.4 35.0* 41.5 37.4  MCV 93.7 92.6 93.5 93.3  PLT 208 169 232 198   Cardiac Enzymes: No results for input(s): CKTOTAL, CKMB, CKMBINDEX, TROPONINI in the last 168 hours. BNP: Invalid input(s): POCBNP CBG: No results for input(s): GLUCAP in the last 168 hours. D-Dimer No results for input(s): DDIMER in the last 72 hours. Hgb A1c No results for  input(s): HGBA1C in the last 72 hours. Lipid Profile No results for input(s): CHOL, HDL, LDLCALC, TRIG, CHOLHDL, LDLDIRECT in the last 72 hours. Thyroid function studies No results for input(s): TSH, T4TOTAL, T3FREE, THYROIDAB in the last 72 hours.  Invalid input(s): FREET3 Anemia work up No results for input(s): VITAMINB12, FOLATE, FERRITIN, TIBC, IRON, RETICCTPCT in the last 72 hours. Urinalysis    Component Value Date/Time   COLORURINE YELLOW (A) 02/18/2020 1223   APPEARANCEUR HAZY (A) 02/18/2020 1223   APPEARANCEUR Clear 11/05/2019 1015   LABSPEC 1.024 02/18/2020 1223   PHURINE 6.0 02/18/2020 1223   GLUCOSEU NEGATIVE 02/18/2020 1223   HGBUR NEGATIVE 02/18/2020 1223   [place] 02/18/2020 1223   BILIRUBINUR Negative 11/05/2019 [place] 02/18/2020 1223   PROTEINUR NEGATIVE 02/18/2020 1223   UROBILINOGEN 0.2 06/10/2019 1606   UROBILINOGEN 0.2 08/02/2015 1200   NITRITE NEGATIVE 02/18/2020 1223   LEUKOCYTESUR MODERATE (A) 02/18/2020 1223   Sepsis Labs Invalid input(s): PROCALCITONIN,  WBC,  LACTICIDVEN Microbiology Recent Results (from the past 240 hour(s))  SARS Coronavirus 2 by RT PCR (hospital order, performed in [place] hospital lab) Nasopharyngeal Nasopharyngeal Swab     Status: None   Collection Time: 02/14/20  8:14 PM   Specimen: Nasopharyngeal Swab  Result Value Ref Range Status   SARS Coronavirus 2  NEGATIVE NEGATIVE Final    Comment: (NOTE) SARS-CoV-2 target nucleic acids are NOT DETECTED. The SARS-CoV-2 RNA is generally detectable in upper and lower respiratory specimens during the acute phase of infection. The lowest concentration of SARS-CoV-2 viral copies this assay can detect is 250 copies / mL. A negative result does not preclude SARS-CoV-2 infection and should not be used as the sole basis for treatment or other patient management decisions.  A negative result may occur with improper specimen collection / handling, submission of specimen other than nasopharyngeal swab, presence of viral mutation(s) within the areas targeted by this assay, and inadequate number of viral copies (<250 copies / mL). A negative result must be combined with clinical observations, patient history, and epidemiological information. Fact Sheet for Patients:   StrictlyIdeas.no Fact Sheet for Healthcare Providers: BankingDealers.co.za This test is not yet approved or cleared  by the [place] FDA and has been authorized for detection and/or diagnosis of SARS-CoV-2 by FDA under an Emergency Use Authorization (EUA).  This EUA will remain in effect (meaning this test can be used) for the duration of the COVID-19 declaration under Section 564(b)(1) of the Act, 21 U.S.C. section 360bbb-3(b)(1), unless the authorization is terminated or revoked sooner. Performed at [facility], [address], [place], [place] [address]   Urine Culture     Status: Abnormal   Collection Time: 02/18/20 12:23 PM   Specimen: Urine, Random  Result Value Ref Range Status   Specimen Description   Final    URINE, RANDOM Performed at [facility], [address], [place], [place] [address]    Special Requests   Final    NONE Performed at [facility], [place], [place] [address]    Culture 20,000 COLONIES/mL KLEBSIELLA  ORNITHINOLYTICA (A)  Final   Report Status 02/20/2020 FINAL  Final   Organism ID, Bacteria KLEBSIELLA ORNITHINOLYTICA (A)  Final      Susceptibility   Klebsiella ornithinolytica - MIC*    AMPICILLIN >=32 RESISTANT Resistant     CEFAZOLIN >=64 RESISTANT Resistant     CEFTRIAXONE 32 INTERMEDIATE Intermediate     CIPROFLOXACIN 1 SENSITIVE  Sensitive     GENTAMICIN >=16 RESISTANT Resistant     IMIPENEM <=0.25 SENSITIVE Sensitive     NITROFURANTOIN <=16 SENSITIVE Sensitive     TRIMETH/SULFA >=320 RESISTANT Resistant     AMPICILLIN/SULBACTAM >=32 RESISTANT Resistant     PIP/TAZO 8 SENSITIVE Sensitive     * 20,000 COLONIES/mL KLEBSIELLA ORNITHINOLYTICA     Total time spend on discharging this patient, including the last patient exam, discussing the hospital stay, instructions for ongoing care as it relates to all pertinent caregivers, as well as preparing the medical discharge records, prescriptions, and/or referrals as applicable, is 35 minutes.    Darlin Priestly, MD  [facility] 02/20/2020, 10:48 AM  If 7PM-7AM, please contact night-coverage

## 2020-02-20 NOTE — Plan of Care (Signed)
The patient has been discharged. IV removed. The patient left on room air oxygen level is 97% on room air. Daughter Jaedah Lords has been notified the patient has been discharged via EMS.  Problem: Education: Goal: Knowledge of General Education information will improve Description: Including pain rating scale, medication(s)/side effects and non-pharmacologic comfort measures Outcome: Completed/Met   Problem: Health Behavior/Discharge Planning: Goal: Ability to manage health-related needs will improve Outcome: Completed/Met   Problem: Clinical Measurements: Goal: Ability to maintain clinical measurements within normal limits will improve Outcome: Completed/Met Goal: Will remain free from infection Outcome: Completed/Met Goal: Diagnostic test results will improve Outcome: Completed/Met Goal: Respiratory complications will improve Outcome: Completed/Met Goal: Cardiovascular complication will be avoided Outcome: Completed/Met   Problem: Activity: Goal: Risk for activity intolerance will decrease Outcome: Completed/Met   Problem: Nutrition: Goal: Adequate nutrition will be maintained Outcome: Completed/Met   Problem: Coping: Goal: Level of anxiety will decrease Outcome: Completed/Met   Problem: Elimination: Goal: Will not experience complications related to bowel motility Outcome: Completed/Met Goal: Will not experience complications related to urinary retention Outcome: Completed/Met   Problem: Pain Managment: Goal: General experience of comfort will improve Outcome: Completed/Met   Problem: Safety: Goal: Ability to remain free from injury will improve Outcome: Completed/Met   Problem: Skin Integrity: Goal: Risk for impaired skin integrity will decrease Outcome: Completed/Met   Problem: Acute Rehab PT Goals(only PT should resolve) Goal: Pt Will Go Supine/Side To Sit Outcome: Completed/Met Goal: Pt Will Transfer Bed To Chair/Chair To Bed Outcome:  Completed/Met Goal: Pt Will Perform Standing Balance Or Pre-Gait Outcome: Completed/Met Goal: Pt Will Ambulate Outcome: Completed/Met Goal: Pt Will Go Up/Down Stairs Outcome: Completed/Met   Problem: Acute Rehab OT Goals (only OT should resolve) Goal: Pt. Will Perform Upper Body Dressing Outcome: Completed/Met Goal: Pt. Will Transfer To Toilet Outcome: Completed/Met Goal: OT Additional ADL Goal #1 Outcome: Completed/Met

## 2020-02-25 DIAGNOSIS — M6281 Muscle weakness (generalized): Secondary | ICD-10-CM | POA: Diagnosis not present

## 2020-02-25 DIAGNOSIS — F418 Other specified anxiety disorders: Secondary | ICD-10-CM | POA: Diagnosis not present

## 2020-02-25 DIAGNOSIS — K219 Gastro-esophageal reflux disease without esophagitis: Secondary | ICD-10-CM | POA: Diagnosis not present

## 2020-02-25 DIAGNOSIS — J449 Chronic obstructive pulmonary disease, unspecified: Secondary | ICD-10-CM | POA: Diagnosis not present

## 2020-02-25 DIAGNOSIS — E785 Hyperlipidemia, unspecified: Secondary | ICD-10-CM | POA: Diagnosis not present

## 2020-02-25 DIAGNOSIS — I10 Essential (primary) hypertension: Secondary | ICD-10-CM | POA: Diagnosis not present

## 2020-02-25 DIAGNOSIS — I509 Heart failure, unspecified: Secondary | ICD-10-CM | POA: Diagnosis not present

## 2020-02-25 DIAGNOSIS — M5489 Other dorsalgia: Secondary | ICD-10-CM | POA: Diagnosis not present

## 2020-02-25 DIAGNOSIS — E039 Hypothyroidism, unspecified: Secondary | ICD-10-CM | POA: Diagnosis not present

## 2020-02-26 ENCOUNTER — Telehealth (HOSPITAL_COMMUNITY): Payer: Self-pay

## 2020-02-26 NOTE — Telephone Encounter (Signed)
Called to schedule mra, no answer, left vm. AW  ?

## 2020-02-27 ENCOUNTER — Telehealth (HOSPITAL_COMMUNITY): Payer: Self-pay

## 2020-02-27 NOTE — Telephone Encounter (Signed)
Daughter returned call. Pt is in rehab for the next couple weeks. Will schedule after. AW

## 2020-03-02 NOTE — Progress Notes (Signed)
Cardiology Office Note  Date:  03/04/2020   ID:  [name] [name], [name] [name] 1946/03/21, MRN [id number]  PCP:  [name], MD   Chief Complaint  Patient presents with  . Other    Patient c.o swelling in ankles and SOB. Meds reviewed verbally with patient.     HPI:  [name] [name] is a 74 year old woman with past medical history of Hemiparesis affecting right side from stroke Carotid stenosis COPD, 40-year smoking history Mild aortic valve stenosis CT scan 2019  : 50% right ICA origin stenosis,  50-55% left common carotid artery stenoses,  Severe right subclavian artery origin stenosis, and severe right and moderate left vertebral artery origin stenoses. Who presents for routine follow-up of her shortness of breath, weight gain, hypoxia,  Prior clinic visit March 2021 It was felt that shortness of breath was likely multifactorial from higher weight, deconditioning, long smoking history, COPD , Aortic valve stenosis  Diuretic regiment was changed as below Lasix 20 mg daily for weigth 178 to 183 Weight >183, lasix 20 twice a day x 1 day until weight drops Weight <177, no lasix   ----Lifestyle modification, weight loss recommended, conditioning She has had a recent stress test on that visit  On home oxygen for her COPD Previously noted to have hypoxia on exertion, into the 80s back into the 90s on resting --- Of note she is not using oxygen during the day but feels he might need it  REDS VEST score today of 21% Weight stable 180 pounds Goal weight is likely around 180 pounds  EKG personally reviewed by myself on todays visit Shows normal sinus rhythm rate 99 bpm no significant ST-T wave changes  Other past medical history reviewed Echo 2019 - Left ventricle: estimated ejection fraction was in the range of 60%  to 65%. Wall motion was normal; there were no regional wall  motion abnormalities. Doppler parameters are consistent with  abnormal left ventricular  relaxation (grade 1 diastolic  dysfunction). Indeterminate LV filling pressure.  - Aortic valve:severe thickening; severely thickened, moderately calcified leaflets. Valve mobility was moderately restricted. There was mild stenosis. mild regurgitation. Mean gradient (S): 9 mm  Hg. Valve area (VTI): 1.5 cm^2.   Stress test: low risk 01/2019   PMH:   has a past medical history of (HFpEF) heart failure with preserved ejection fraction (HCC), Aneurysm (HCC), Anxiety, Anxiety and depression, Aortic stenosis, Carotid stenosis, CHF (congestive heart failure) (HCC), Compression fracture of body of thoracic vertebra (HCC), Concussion (08/03/2015), COPD (chronic obstructive pulmonary disease) (HCC) (12/2012), Depression, Fall (08/03/2015), Fracture of cervical vertebra, C5 (HCC) (08/06/2015), History of chicken pox, Hyperlipidemia, Hypertension, Hypothyroidism, Lower back pain, Neuropathy, On supplemental oxygen by nasal cannula, Osteoarthritis, Osteoporosis (11/2010), Peripheral vascular disease (HCC), Smoker, Stroke (HCC) (2010), and Wears dentures.  PSH:    Past Surgical History:  Procedure Laterality Date  . APPENDECTOMY  1960  . CATARACT EXTRACTION     bilateral  . CESAREAN SECTION    . CHOLECYSTECTOMY  1970  . COLONOSCOPY  2004   diverticulosis, no polyps Jarold Motto)  . COLONOSCOPY  05/2016   decreased sphincter tone, diverticulosis, no f/u recommended ([place]is)  . DEXA  11/2010   T -2.7 spine, -1.9 hip  . HIP SURGERY Left 2006   fractured - screws placed  . IR ANGIO INTRA EXTRACRAN SEL COM CAROTID INNOMINATE BILAT MOD SED  08/01/2018  . IR ANGIO INTRA EXTRACRAN SEL COM CAROTID INNOMINATE BILAT MOD SED  12/10/2018  . IR ANGIO VERTEBRAL SEL SUBCLAVIAN  INNOMINATE UNI R MOD SED  08/01/2018  . IR ANGIO VERTEBRAL SEL SUBCLAVIAN INNOMINATE UNI R MOD SED  12/10/2018  . IR ANGIO VERTEBRAL SEL VERTEBRAL UNI L MOD SED  12/10/2018  . IR RADIOLOGIST EVAL & MGMT  01/09/2018  . KYPHOPLASTY  10/02/2017    Procedure: LUMBAR FOUR KYPHOPLASTY;  Surgeon: Ashok Pall, MD;  Location: [place];  Service: Neurosurgery;;  . KYPHOPLASTY N/A 01/29/2020   Procedure: Thoracic Ten Kyphoplasty;  Surgeon: Ashok Pall, MD;  Location: [place];  Service: Neurosurgery;  Laterality: N/A;  . RADIOLOGY WITH ANESTHESIA N/A 11/11/2015   Procedure: RADIOLOGY WITH ANESTHESIA;  Surgeon: Luanne Bras, MD;  Location: [place];  Service: Radiology;  Laterality: N/A;  . RADIOLOGY WITH ANESTHESIA N/A 12/10/2018   Procedure: STENTING;  Surgeon: Luanne Bras, MD;  Location: [place];  Service: Radiology;  Laterality: N/A;    Current Outpatient Medications  Medication Sig Dispense Refill  . acetaminophen (TYLENOL) 500 MG tablet Take 1 tablet (500 mg total) by mouth every 6 (six) hours as needed for headache.    . albuterol (VENTOLIN HFA) 108 (90 Base) MCG/ACT inhaler INHALE 2 PUFFS INTO THE LUNGS EVERY 6 (SIX) HOURS AS NEEDED FOR WHEEZING OR SHORTNESS OF BREATH. (Patient taking differently: Inhale 2 puffs into the lungs daily. ) 18 g 0  . alendronate (FOSAMAX) 70 MG tablet TAKE 1 TABLET WEEKLY. TAKE WITH A FULL GLASS OF WATER ON AN EMPTY STOMACH. (Patient taking differently: Take 70 mg by mouth every Tuesday. TAKE WITH A FULL GLASS OF WATER ON AN EMPTY STOMACH.) 12 tablet 2  . aspirin (ASPIRIN 81) 81 MG EC tablet Take 1 tablet (81 mg total) by mouth daily. Swallow whole.    [name] atorvastatin (LIPITOR) 40 MG tablet TAKE 1 TABLET EVERY DAY 90 tablet 1  . Cholecalciferol (VITAMIN D3) 125 MCG (5000 UT) CAPS Take 5,000 Units by mouth daily. Gummies    . clopidogrel (PLAVIX) 75 MG tablet TAKE 1 TABLET (75 MG TOTAL) BY MOUTH DAILY. 90 tablet 1  . Cranberry-Vitamin C (AZO CRANBERRY URINARY TRACT PO) Take 1 tablet by mouth in the morning and at bedtime.     . cyclobenzaprine (FLEXERIL) 10 MG tablet Take 1 tablet (10 mg total) by mouth 3 (three) times daily as needed for muscle spasms. 30 tablet 0  . furosemide (LASIX) 40 MG tablet Take 20 mg by  mouth daily as needed for fluid. Weight every morning before given    . gabapentin (NEURONTIN) 100 MG capsule TAKE 1 CAPSULE (100 MG TOTAL) BY MOUTH AT BEDTIME. 90 capsule 1  . levothyroxine (SYNTHROID) 50 MCG tablet TAKE 1 TABLET  DAILY BEFORE BREAKFAST. 90 tablet 1  . Melatonin 5 MG CHEW Chew 5 mg by mouth daily. Gummies    . Multiple Vitamins-Minerals (MULTIVITAMIN PO) Take 1 tablet by mouth daily. Gummie    . ondansetron (ZOFRAN) 4 MG tablet Take 1 tablet (4 mg total) by mouth every 8 (eight) hours as needed for nausea or vomiting. 20 tablet 0  . OXYGEN Inhale 2 L/min into the lungs See admin instructions. Inhale 2 L/min of oxygen at bedtime and during the day as needed for shortness of breath    . pantoprazole (PROTONIX) 40 MG tablet Take 1 tablet (40 mg total) by mouth daily.    . Potassium Chloride ER 20 MEQ TBCR TAKE 2 TABLETS BY MOUTH AS NEEDED (WHENEVER  YOU  TAKE  LASIX) (Patient taking differently: Take 20 mEq by mouth daily as needed (Only when  takes Lasix). ) 180 tablet 2  . sertraline (ZOLOFT) 100 MG tablet TAKE 1 TABLET EVERY DAY 90 tablet 1  . traMADol (ULTRAM) 50 MG tablet Take 1-2 tablets (50-100 mg total) by mouth 2 (two) times daily as needed for moderate pain. 20 tablet 0  . traZODone (DESYREL) 100 MG tablet Take 1 tablet (100 mg total) by mouth at bedtime.    [name] trimethoprim (TRIMPEX) 100 MG tablet Take 1 tablet (100 mg total) by mouth daily. Take daily to prevent UTI 90 tablet 1  . vitamin C (ASCORBIC ACID) 250 MG tablet Take 500 mg by mouth daily. Gummies    . Zinc 30 MG TABS Take 30 mg by mouth daily. Gummies     No current facility-administered medications for this visit.    Allergies:   Doxycycline and Fluticasone-salmeterol   Social History:  The patient  reports that she quit smoking about 7 years ago. Her smoking use included cigarettes. She has a 92.00 pack-year smoking history. She has never used smokeless tobacco. She reports that she does not drink alcohol or  use drugs.   Family History:   family history includes CAD in her maternal aunt, mother, and sister; Cancer in her maternal grandmother and mother; Cirrhosis in her father; Diabetes in her maternal aunt; Hypertension in her daughter; Sudden death (age of onset: 74) in her father.    Review of Systems: Review of Systems  Constitutional: Negative.   HENT: Negative.   Respiratory: Positive for shortness of breath.   Cardiovascular: Negative.   Gastrointestinal: Negative.   Musculoskeletal: Negative.   Neurological: Negative.   Psychiatric/Behavioral: Negative.   All other systems reviewed and are negative.   PHYSICAL EXAM: VS:  BP 126/80 (BP Location: Right Arm, Patient Position: Sitting, Cuff Size: Normal)   Pulse 99   Ht 4\' 11"  (1.499 m)   Wt 180 lb (81.6 kg)   BMI 36.36 kg/m  , BMI Body mass index is 36.36 kg/m. GEN: Well nourished, well developed, in no acute distress HEENT: normal Neck: no JVD, carotid bruits, or masses Cardiac: RRR; no murmurs, rubs, or gallops,no edema  Respiratory:  clear to auscultation bilaterally, normal work of breathing GI: soft, nontender, nondistended, + BS MS: no deformity or atrophy Skin: warm and dry, no rash Neuro:  Strength and sensation are intact Psych: euthymic mood, full affect   Recent Labs: 11/05/2019: Pro B Natriuretic peptide (BNP) 32.0 12/23/2019: ALT 18 02/14/2020: B Natriuretic Peptide 71.2; TSH 4.188 02/20/2020: BUN 27; Creatinine, Ser 0.95; Hemoglobin 11.9; Magnesium 2.3; Platelets 198; Potassium 4.3; Sodium 138    Lipid Panel Lab Results  Component Value Date   CHOL 237 (H) 07/01/2019   HDL 65.50 07/01/2019   LDLCALC 157 (H) 07/01/2019   TRIG 75.0 07/01/2019     Wt Readings from Last 3 Encounters:  03/04/20 180 lb (81.6 kg)  02/15/20 180 lb 12.4 oz (82 kg)  01/29/20 176 lb (79.8 kg)      ASSESSMENT AND PLAN:  Problem List Items Addressed This Visit      Cardiology Problems   Hypertension   Hyperlipidemia    Carotid stenosis     Other   COPD (chronic obstructive pulmonary disease) (HCC)   Shortness of breath    Other Visit Diagnoses    Chronic diastolic CHF (congestive heart failure) (HCC)    -  Primary   PAD (peripheral artery disease) (HCC)       Morbid obesity (HCC)       Aortic  valve stenosis, etiology of cardiac valve disease unspecified         Shortness of breath likely multifactorial Weight is higher, deconditioned, Long history of smoking 40 years, underlying COPD Although normal ejection fraction on prior echo, there is diastolic dysfunction, mild aortic valve stenosis 2 years ago --- Continue current diuretic regiment Discussed   Hyperlipidemia On Lipitor LDL above goal, we will add Zetia Goal LDL less than 70 given her carotid stenosis  PAD CT scan 2019 reviewed 50% right ICA origin stenosis, 50-55% left common carotid artery stenoses, severe right subclavian artery origin stenosis, and severe right and moderate left vertebral artery origin stenoses. -Add Zetia as above Will need repeat ultrasound in follow-up  COPD On home oxygen at night Suspect she will need oxygen at nighttime Recommend she check ambulatory saturations at home  Disposition:   F/U  6 months   Total encounter time more than 25 minutes  Greater than 50% was spent in counseling and coordination of care with the patient    Signed, Dossie Arbour, M.D., Ph.D. [facility] [place], [place] [phone]

## 2020-03-03 ENCOUNTER — Ambulatory Visit: Payer: Medicare HMO | Admitting: Urology

## 2020-03-04 ENCOUNTER — Ambulatory Visit: Payer: Medicare HMO | Admitting: Cardiovascular Disease

## 2020-03-04 ENCOUNTER — Other Ambulatory Visit: Payer: Self-pay

## 2020-03-04 ENCOUNTER — Encounter: Payer: Self-pay | Admitting: Cardiovascular Disease

## 2020-03-04 VITALS — BP 126/80 | HR 99 | Ht 59.0 in | Wt 180.0 lb

## 2020-03-04 DIAGNOSIS — I10 Essential (primary) hypertension: Secondary | ICD-10-CM | POA: Diagnosis not present

## 2020-03-04 DIAGNOSIS — E782 Mixed hyperlipidemia: Secondary | ICD-10-CM | POA: Diagnosis not present

## 2020-03-04 DIAGNOSIS — J432 Centrilobular emphysema: Secondary | ICD-10-CM

## 2020-03-04 DIAGNOSIS — I350 Nonrheumatic aortic (valve) stenosis: Secondary | ICD-10-CM | POA: Diagnosis not present

## 2020-03-04 DIAGNOSIS — I5032 Chronic diastolic (congestive) heart failure: Secondary | ICD-10-CM | POA: Diagnosis not present

## 2020-03-04 DIAGNOSIS — I6523 Occlusion and stenosis of bilateral carotid arteries: Secondary | ICD-10-CM

## 2020-03-04 DIAGNOSIS — R0602 Shortness of breath: Secondary | ICD-10-CM | POA: Diagnosis not present

## 2020-03-04 DIAGNOSIS — I739 Peripheral vascular disease, unspecified: Secondary | ICD-10-CM | POA: Diagnosis not present

## 2020-03-04 MED ORDER — EZETIMIBE 10 MG PO TABS
10.0000 mg | ORAL_TABLET | Freq: Every day | ORAL | 3 refills | Status: DC
Start: 2020-03-04 — End: 2020-12-14

## 2020-03-04 NOTE — Patient Instructions (Addendum)
  Medication Instructions:  Start zetia 10 mg daily, humana  If you need a refill on your cardiac medications before your next appointment, please call your pharmacy.    Lab work: No new labs needed   If you have labs (blood work) drawn today and your tests are completely normal, you will receive your results only by: [name] MyChart Message (if you have MyChart) OR . A paper copy in the mail If you have any lab test that is abnormal or we need to change your treatment, we will call you to review the results.   Testing/Procedures: No new testing needed   Follow-Up: At [facility], you and your health needs are our priority.  As part of our continuing mission to provide you with exceptional heart care, we have created designated Provider Care Teams.  These Care Teams include your primary Cardiologist (physician) and Advanced Practice Providers (APPs -  Physician Assistants and Nurse Practitioners) who all work together to provide you with the care you need, when you need it.  . You will need a follow up appointment in 6 months   . Providers on your designated Care Team:   . Nicolasa Ducking, NP . Eula Listen, PA-C . Marisue Ivan, PA-C  Any Other Special Instructions Will Be Listed Below (If Applicable).  For educational health videos Log in to : [email or web address] Or : [email or web address], password : [email or web address]

## 2020-03-09 DIAGNOSIS — I739 Peripheral vascular disease, unspecified: Secondary | ICD-10-CM | POA: Diagnosis not present

## 2020-03-09 DIAGNOSIS — M1990 Unspecified osteoarthritis, unspecified site: Secondary | ICD-10-CM | POA: Diagnosis not present

## 2020-03-09 DIAGNOSIS — I5032 Chronic diastolic (congestive) heart failure: Secondary | ICD-10-CM | POA: Diagnosis not present

## 2020-03-09 DIAGNOSIS — M8008XD Age-related osteoporosis with current pathological fracture, vertebra(e), subsequent encounter for fracture with routine healing: Secondary | ICD-10-CM | POA: Diagnosis not present

## 2020-03-09 DIAGNOSIS — N1831 Chronic kidney disease, stage 3a: Secondary | ICD-10-CM | POA: Diagnosis not present

## 2020-03-09 DIAGNOSIS — I130 Hypertensive heart and chronic kidney disease with heart failure and stage 1 through stage 4 chronic kidney disease, or unspecified chronic kidney disease: Secondary | ICD-10-CM | POA: Diagnosis not present

## 2020-03-09 DIAGNOSIS — N390 Urinary tract infection, site not specified: Secondary | ICD-10-CM | POA: Diagnosis not present

## 2020-03-09 DIAGNOSIS — J449 Chronic obstructive pulmonary disease, unspecified: Secondary | ICD-10-CM | POA: Diagnosis not present

## 2020-03-09 DIAGNOSIS — I350 Nonrheumatic aortic (valve) stenosis: Secondary | ICD-10-CM | POA: Diagnosis not present

## 2020-03-10 ENCOUNTER — Other Ambulatory Visit: Payer: Self-pay

## 2020-03-10 NOTE — Patient Outreach (Signed)
[facility]) Care Management  03/10/2020  [name] [name] Apr 15, 1946 [id number]     Transition of Care Referral  Referral Date: 03/10/2020 Referral Source: [facility] Discharge Report Date of Discharge: 03/10/2020 Facility: [facility] Insurance: [facility]   Referral received. Transition of care calls being completed via EMMI-automated calls. RN CM will outreach patient for any red flags received.      Plan: RN CM will close case at this time.    Antionette Fairy, RN,BSN,CCM [facility] Care Management Telephonic Care Management Coordinator Direct Phone: [phone] Toll Free: [phone] Fax: [phone]

## 2020-03-11 ENCOUNTER — Encounter: Payer: Self-pay | Admitting: Family Medicine

## 2020-03-11 ENCOUNTER — Other Ambulatory Visit: Payer: Self-pay

## 2020-03-11 ENCOUNTER — Ambulatory Visit (INDEPENDENT_AMBULATORY_CARE_PROVIDER_SITE_OTHER): Payer: Medicare HMO | Admitting: Family Medicine

## 2020-03-11 VITALS — BP 128/80 | HR 97 | Temp 97.6°F | Ht 59.0 in | Wt 181.4 lb

## 2020-03-11 DIAGNOSIS — J9611 Chronic respiratory failure with hypoxia: Secondary | ICD-10-CM | POA: Diagnosis not present

## 2020-03-11 DIAGNOSIS — R531 Weakness: Secondary | ICD-10-CM

## 2020-03-11 DIAGNOSIS — I5032 Chronic diastolic (congestive) heart failure: Secondary | ICD-10-CM

## 2020-03-11 DIAGNOSIS — R600 Localized edema: Secondary | ICD-10-CM

## 2020-03-11 DIAGNOSIS — R609 Edema, unspecified: Secondary | ICD-10-CM | POA: Diagnosis not present

## 2020-03-11 DIAGNOSIS — S22079S Unspecified fracture of T9-T10 vertebra, sequela: Secondary | ICD-10-CM | POA: Diagnosis not present

## 2020-03-11 MED ORDER — TRAMADOL HCL 50 MG PO TABS: 50.0000 mg | ORAL_TABLET | Freq: Two times a day (BID) | ORAL | 0 refills | Status: DC | PRN

## 2020-03-11 NOTE — Assessment & Plan Note (Signed)
Mild. See above.

## 2020-03-11 NOTE — Assessment & Plan Note (Signed)
Notes worsening congestion with bibasilar crackles and mild pedal edema but stable weight - reasonable to try lasix 20mg  daily for 2 days and reassess chest congestion. No signs of pneumonia today.

## 2020-03-11 NOTE — Assessment & Plan Note (Signed)
Back to baseline oxygen requirement of 2L [place] at night.

## 2020-03-11 NOTE — Assessment & Plan Note (Signed)
S/p kyphoplasty then admission for gen weakness/deconditioning. Continues recovering well. Oxycodone was too strong with intolerable side effects. Will continue tramadol PRN breakthrough pain.

## 2020-03-11 NOTE — Progress Notes (Signed)
This visit was conducted in person.  BP 128/80 (BP Location: Right Arm, Patient Position: Sitting, Cuff Size: Normal)   Pulse 97   Temp 97.6 F (36.4 C) (Temporal)   Ht 4\' 11"  (1.499 m)   Wt 181 lb 6 oz (82.3 kg)   SpO2 93%   BMI 36.63 kg/m    CC: rehab f/u visit  Subjective:    Patient ID: [name] [name], female    DOB: [birth year], 74 y.o.   MRN: [id number]  HPI: [name] [name] is a 74 y.o. female presenting on 03/11/2020 for Hospitalization Follow-up (Here for rehab f/u.  Pt accompanined by daughter, [name] [name]- temp 98.4.)   Recent hospitalization for generalized weakness, worsening dyspnea with increased home oxygen requirement. She had recently undergone kyphoplasty for recent T10 compression fracture. Possibly overmedication after procedure leading to hypotension - was on valium, tramadol, ?oxycodone, along with trazodone and melatonin for insomnia. CTA chest ruled out PE, showed new pulm nodule thought post-inflammatory.   Given acute deconditioning, discharged to SNF. She came home last Thursday. Since home, she is managing. Not using walker, but instead using cane with good mobility. No recent falls.   [facility] PT involved.  Has not been taking furosemide 20mg  (PRN). Instead elevating legs when at home.  Continues trazodone, gabapentin and melatonin.   Admit date: 02/14/2020 Discharge date: 02/20/2020 TCM hosp f/u phone call not completed.   Admitted From: home Disposition:  SNF CODE STATUS: Full code  Discharge Diagnoses:  Principal Problem:   Acute on chronic respiratory failure with hypoxia ([place]) Active Problems:   Hypertension   Peripheral vascular disease (HCC)   COPD (chronic obstructive pulmonary disease) (HCC)   Hyperlipidemia   Insomnia   Hypothyroidism (acquired)   Compression fracture of body of thoracic vertebra (HCC)   Chronic back pain   History of recurrent UTIs     Relevant past medical, surgical, family and social history reviewed and  updated as indicated. Interim medical history since our last visit reviewed. Allergies and medications reviewed and updated. Outpatient Medications Prior to Visit  Medication Sig Dispense Refill  . acetaminophen (TYLENOL) 500 MG tablet Take 1 tablet (500 mg total) by mouth every 6 (six) hours as needed for headache.    . albuterol (VENTOLIN HFA) 108 (90 Base) MCG/ACT inhaler INHALE 2 PUFFS INTO THE LUNGS EVERY 6 (SIX) HOURS AS NEEDED FOR WHEEZING OR SHORTNESS OF BREATH. (Patient taking differently: Inhale 2 puffs into the lungs daily. ) 18 g 0  . alendronate (FOSAMAX) 70 MG tablet TAKE 1 TABLET WEEKLY. TAKE WITH A FULL GLASS OF WATER ON AN EMPTY STOMACH. (Patient taking differently: Take 70 mg by mouth every Tuesday. TAKE WITH A FULL GLASS OF WATER ON AN EMPTY STOMACH.) 12 tablet 2  . aspirin (ASPIRIN 81) 81 MG EC tablet Take 1 tablet (81 mg total) by mouth daily. Swallow whole.    [name] atorvastatin (LIPITOR) 40 MG tablet TAKE 1 TABLET EVERY DAY 90 tablet 1  . Cholecalciferol (VITAMIN D3) 125 MCG (5000 UT) CAPS Take 5,000 Units by mouth daily. Gummies    . clopidogrel (PLAVIX) 75 MG tablet TAKE 1 TABLET (75 MG TOTAL) BY MOUTH DAILY. 90 tablet 1  . Cranberry-Vitamin C (AZO CRANBERRY URINARY TRACT PO) Take 1 tablet by mouth in the morning and at bedtime.     [name] ezetimibe (ZETIA) 10 MG tablet Take 1 tablet (10 mg total) by mouth daily. 90 tablet 3  . furosemide (LASIX) 40 MG tablet Take 20  mg by mouth daily as needed for fluid. Weight every morning before given    . gabapentin (NEURONTIN) 100 MG capsule TAKE 1 CAPSULE (100 MG TOTAL) BY MOUTH AT BEDTIME. 90 capsule 1  . levothyroxine (SYNTHROID) 50 MCG tablet TAKE 1 TABLET  DAILY BEFORE BREAKFAST. 90 tablet 1  . Melatonin 5 MG CHEW Chew 5 mg by mouth daily. Gummies    . Multiple Vitamins-Minerals (MULTIVITAMIN PO) Take 1 tablet by mouth daily. Gummie    . ondansetron (ZOFRAN) 4 MG tablet Take 1 tablet (4 mg total) by mouth every 8 (eight) hours as needed  for nausea or vomiting. 20 tablet 0  . OXYGEN Inhale 2 L/min into the lungs See admin instructions. Inhale 2 L/min of oxygen at bedtime and during the day as needed for shortness of breath    . pantoprazole (PROTONIX) 40 MG tablet Take 1 tablet (40 mg total) by mouth daily.    . Potassium Chloride ER 20 MEQ TBCR TAKE 2 TABLETS BY MOUTH AS NEEDED (WHENEVER  YOU  TAKE  LASIX) (Patient taking differently: Take 20 mEq by mouth daily as needed (Only when takes Lasix). ) 180 tablet 2  . sertraline (ZOLOFT) 100 MG tablet TAKE 1 TABLET EVERY DAY 90 tablet 1  . traZODone (DESYREL) 100 MG tablet Take 1 tablet (100 mg total) by mouth at bedtime.    [name] trimethoprim (TRIMPEX) 100 MG tablet Take 1 tablet (100 mg total) by mouth daily. Take daily to prevent UTI 90 tablet 1  . vitamin C (ASCORBIC ACID) 250 MG tablet Take 500 mg by mouth daily. Gummies    . Zinc 30 MG TABS Take 30 mg by mouth daily. Gummies    . traMADol (ULTRAM) 50 MG tablet Take 1-2 tablets (50-100 mg total) by mouth 2 (two) times daily as needed for moderate pain. 20 tablet 0  . cyclobenzaprine (FLEXERIL) 10 MG tablet Take 1 tablet (10 mg total) by mouth 3 (three) times daily as needed for muscle spasms. 30 tablet 0   No facility-administered medications prior to visit.     Per HPI unless specifically indicated in ROS section below Review of Systems Objective:  BP 128/80 (BP Location: Right Arm, Patient Position: Sitting, Cuff Size: Normal)   Pulse 97   Temp 97.6 F (36.4 C) (Temporal)   Ht 4\' 11"  (1.499 m)   Wt 181 lb 6 oz (82.3 kg)   SpO2 93%   BMI 36.63 kg/m   Wt Readings from Last 3 Encounters:  03/11/20 181 lb 6 oz (82.3 kg)  03/04/20 180 lb (81.6 kg)  02/15/20 180 lb 12.4 oz (82 kg)      Physical Exam Vitals and nursing note reviewed.  Constitutional:      Appearance: Normal appearance. She is not ill-appearing.  Cardiovascular:     Rate and Rhythm: Normal rate and regular rhythm.     Pulses: Normal pulses.     Heart  sounds: Normal heart sounds. No murmur heard.   Pulmonary:     Effort: Pulmonary effort is normal. No respiratory distress.     Breath sounds: Normal breath sounds. No wheezing, rhonchi or rales.     Comments: Bibasilar crackles Musculoskeletal:     Right lower leg: Edema (tr) present.     Left lower leg: Edema (tr) present.  Skin:    General: Skin is warm and dry.     Findings: No rash.     Comments: Bruising to bilateral forearms   Neurological:  Mental Status: She is alert.  Psychiatric:        Behavior: Behavior normal.       Lab Results  Component Value Date   CREATININE 0.95 02/20/2020   BUN 27 (H) 02/20/2020   NA 138 02/20/2020   K 4.3 02/20/2020   CL 107 02/20/2020   CO2 26 02/20/2020    DG Chest Port 1 View CLINICAL DATA:  Tachycardia  EXAM: PORTABLE CHEST 1 VIEW  COMPARISON:  02/14/2019  FINDINGS: Cardiac shadow is enlarged but stable. Changes of prior vertebral augmentation are seen. Aortic calcifications are noted. Scattered calcified granulomas are seen. Minimal basilar atelectasis is again noted. No bony abnormality is seen.  IMPRESSION: Stable appearance of the chest from 5 days previous.  Electronically Signed   By: Alcide Clever M.D.   On: 02/18/2020 10:46   Assessment & Plan:  This visit occurred during the SARS-CoV-2 public health emergency.  Safety protocols were in place, including screening questions prior to the visit, additional usage of staff PPE, and extensive cleaning of exam room while observing appropriate contact time as indicated for disinfecting solutions.   Problem List Items Addressed This Visit    Weakness   Peripheral edema    Mild. See above.       Closed T10 fracture (HCC) - Primary    S/p kyphoplasty then admission for gen weakness/deconditioning. Continues recovering well. Oxycodone was too strong with intolerable side effects. Will continue tramadol PRN breakthrough pain.      Chronic respiratory failure with  hypoxia (HCC)    Back to baseline oxygen requirement of 2L [place] at night.       Chronic diastolic heart failure (HCC)    Notes worsening congestion with bibasilar crackles and mild pedal edema but stable weight - reasonable to try lasix 20mg  daily for 2 days and reassess chest congestion. No signs of pneumonia today.           Meds ordered this encounter  Medications  . DISCONTD: traMADol (ULTRAM) 50 MG tablet    Sig: Take 1-2 tablets (50-100 mg total) by mouth 2 (two) times daily as needed for moderate pain.    Dispense:  20 tablet    Refill:  0  . traMADol (ULTRAM) 50 MG tablet    Sig: Take 1-2 tablets (50-100 mg total) by mouth 2 (two) times daily as needed for moderate pain.    Dispense:  20 tablet    Refill:  0   No orders of the defined types were placed in this encounter.   Patient Instructions  Take lasix 20mg  daily for 2 days and update with effect on chest congestion.  Let know if fever or worsening productive cough in the interim.  You are doing well today Tramadol refilled. Return in 4 months for wellness visit/physical.    Follow up plan: Return in about 4 months (around 07/11/2020) for annual exam, prior fasting for blood work, medicare wellness visit.  [place], MD

## 2020-03-11 NOTE — Patient Instructions (Addendum)
Take lasix 20mg  daily for 2 days and update with effect on chest congestion.  Let us know if fever or worsening productive cough in the interim.  You are doing well today Tramadol refilled. Return in 4 months for wellness visit/physical.

## 2020-03-13 DIAGNOSIS — J449 Chronic obstructive pulmonary disease, unspecified: Secondary | ICD-10-CM | POA: Diagnosis not present

## 2020-03-13 DIAGNOSIS — R269 Unspecified abnormalities of gait and mobility: Secondary | ICD-10-CM | POA: Diagnosis not present

## 2020-03-16 ENCOUNTER — Telehealth: Payer: Self-pay | Admitting: *Deleted

## 2020-03-16 DIAGNOSIS — Z8673 Personal history of transient ischemic attack (TIA), and cerebral infarction without residual deficits: Secondary | ICD-10-CM

## 2020-03-16 DIAGNOSIS — E785 Hyperlipidemia, unspecified: Secondary | ICD-10-CM

## 2020-03-16 DIAGNOSIS — M1990 Unspecified osteoarthritis, unspecified site: Secondary | ICD-10-CM | POA: Diagnosis not present

## 2020-03-16 DIAGNOSIS — Z792 Long term (current) use of antibiotics: Secondary | ICD-10-CM

## 2020-03-16 DIAGNOSIS — Z87891 Personal history of nicotine dependence: Secondary | ICD-10-CM

## 2020-03-16 DIAGNOSIS — N390 Urinary tract infection, site not specified: Secondary | ICD-10-CM | POA: Diagnosis not present

## 2020-03-16 DIAGNOSIS — I130 Hypertensive heart and chronic kidney disease with heart failure and stage 1 through stage 4 chronic kidney disease, or unspecified chronic kidney disease: Secondary | ICD-10-CM | POA: Diagnosis not present

## 2020-03-16 DIAGNOSIS — Z9981 Dependence on supplemental oxygen: Secondary | ICD-10-CM

## 2020-03-16 DIAGNOSIS — I5032 Chronic diastolic (congestive) heart failure: Secondary | ICD-10-CM | POA: Diagnosis not present

## 2020-03-16 DIAGNOSIS — I350 Nonrheumatic aortic (valve) stenosis: Secondary | ICD-10-CM | POA: Diagnosis not present

## 2020-03-16 DIAGNOSIS — Z87440 Personal history of urinary (tract) infections: Secondary | ICD-10-CM

## 2020-03-16 DIAGNOSIS — I739 Peripheral vascular disease, unspecified: Secondary | ICD-10-CM

## 2020-03-16 DIAGNOSIS — Z9181 History of falling: Secondary | ICD-10-CM

## 2020-03-16 DIAGNOSIS — N1831 Chronic kidney disease, stage 3a: Secondary | ICD-10-CM | POA: Diagnosis not present

## 2020-03-16 DIAGNOSIS — Z7902 Long term (current) use of antithrombotics/antiplatelets: Secondary | ICD-10-CM

## 2020-03-16 DIAGNOSIS — E039 Hypothyroidism, unspecified: Secondary | ICD-10-CM

## 2020-03-16 DIAGNOSIS — K219 Gastro-esophageal reflux disease without esophagitis: Secondary | ICD-10-CM

## 2020-03-16 DIAGNOSIS — Z7982 Long term (current) use of aspirin: Secondary | ICD-10-CM

## 2020-03-16 DIAGNOSIS — D631 Anemia in chronic kidney disease: Secondary | ICD-10-CM

## 2020-03-16 DIAGNOSIS — G4700 Insomnia, unspecified: Secondary | ICD-10-CM

## 2020-03-16 DIAGNOSIS — F329 Major depressive disorder, single episode, unspecified: Secondary | ICD-10-CM

## 2020-03-16 DIAGNOSIS — F419 Anxiety disorder, unspecified: Secondary | ICD-10-CM

## 2020-03-16 DIAGNOSIS — M8008XD Age-related osteoporosis with current pathological fracture, vertebra(e), subsequent encounter for fracture with routine healing: Secondary | ICD-10-CM | POA: Diagnosis not present

## 2020-03-16 DIAGNOSIS — J449 Chronic obstructive pulmonary disease, unspecified: Secondary | ICD-10-CM | POA: Diagnosis not present

## 2020-03-16 NOTE — Telephone Encounter (Signed)
Agree with this. Thank you.  

## 2020-03-16 NOTE — Telephone Encounter (Signed)
Judeth Cornfield nurse with [facility] left a voicemail requesting verbal orders for PT evaluation.

## 2020-03-16 NOTE — Telephone Encounter (Signed)
Spoke with Stephanie informing her Dr. G is giving verbal orders for services requested.  

## 2020-03-17 DIAGNOSIS — J449 Chronic obstructive pulmonary disease, unspecified: Secondary | ICD-10-CM | POA: Diagnosis not present

## 2020-03-17 DIAGNOSIS — R269 Unspecified abnormalities of gait and mobility: Secondary | ICD-10-CM | POA: Diagnosis not present

## 2020-03-18 ENCOUNTER — Telehealth: Payer: Self-pay | Admitting: *Deleted

## 2020-03-18 DIAGNOSIS — J9611 Chronic respiratory failure with hypoxia: Secondary | ICD-10-CM

## 2020-03-18 DIAGNOSIS — M1990 Unspecified osteoarthritis, unspecified site: Secondary | ICD-10-CM | POA: Diagnosis not present

## 2020-03-18 DIAGNOSIS — J432 Centrilobular emphysema: Secondary | ICD-10-CM

## 2020-03-18 DIAGNOSIS — N1831 Chronic kidney disease, stage 3a: Secondary | ICD-10-CM | POA: Diagnosis not present

## 2020-03-18 DIAGNOSIS — I350 Nonrheumatic aortic (valve) stenosis: Secondary | ICD-10-CM | POA: Diagnosis not present

## 2020-03-18 DIAGNOSIS — J449 Chronic obstructive pulmonary disease, unspecified: Secondary | ICD-10-CM | POA: Diagnosis not present

## 2020-03-18 DIAGNOSIS — I739 Peripheral vascular disease, unspecified: Secondary | ICD-10-CM | POA: Diagnosis not present

## 2020-03-18 DIAGNOSIS — N390 Urinary tract infection, site not specified: Secondary | ICD-10-CM | POA: Diagnosis not present

## 2020-03-18 DIAGNOSIS — I130 Hypertensive heart and chronic kidney disease with heart failure and stage 1 through stage 4 chronic kidney disease, or unspecified chronic kidney disease: Secondary | ICD-10-CM | POA: Diagnosis not present

## 2020-03-18 DIAGNOSIS — I5032 Chronic diastolic (congestive) heart failure: Secondary | ICD-10-CM | POA: Diagnosis not present

## 2020-03-18 DIAGNOSIS — M8008XD Age-related osteoporosis with current pathological fracture, vertebra(e), subsequent encounter for fracture with routine healing: Secondary | ICD-10-CM | POA: Diagnosis not present

## 2020-03-18 NOTE — Telephone Encounter (Signed)
Agree with verbal orders.  Order placed for portable oxygen concentrated in chart, plz fax or send to appropriate location.

## 2020-03-18 NOTE — Telephone Encounter (Signed)
Lvm for [name] [name] informing her Dr. Reece Agar is giving verbal orders for portal oxygen concentrated.  Asked her to call back with a fax # to send printed order, if needed.  [Placed printed order in basket on Lisa's desk.]

## 2020-03-18 NOTE — Telephone Encounter (Signed)
Enrique Sack PT with [facility] left a voicemail stating that the patient has been sent home from rehab. Enrique Sack is requesting PT orders for two times a week for 3 weeks, once a week for 2 weeks and once every other week for two weeks. Enrique Sack stated that she would like for Dr. Sharen Hones to start a referral for a portable oxygen concentrator for patient to be able to take out with her. Enrique Sack stated that patient has an in home oxygen tank. Enrique Sack stated that the patient is going out in the community without any type of oxygen and she is very concerned. Enrique Sack stated that patient desaturates in the 70's after walking 20 feet. Enrique Sack requested a call back with verbal orders.

## 2020-03-19 NOTE — Telephone Encounter (Addendum)
[name] [name] (DPR signed) left v/m that her mother does not live with her now; pt lives with another daughter; [name] [name] working full time and not able to come with pt to appts with DR G.[name] [name] is aware yesterday HH PT did eval and was concerned about oxygen level dropping when pt exercising or walking.  Tonya left v/m requesting cb to discuss some of her concerns with Dr Reece Agar. I left v/m requesting Tonya to cb so can list her concerns and then send note to DR G. FYI to [employer] LPN and Campbell Lerner CMA.  Tonya called back; pts oxygen level does not drop unless on a pain med. Pt told Tonya that PT said her oxygen level went down to 70s while walking around. [name] [name] is concerned if DR G has changed for pt to get pain med or sleep med changes. [name] [name] wants to make sure that Dr Reece Agar knows that pt is taking trazodone 100 mg at hs and taking additional melatonin 5 mg by chewing # 4 of the melatonin 5 mg. Tonya request cb.

## 2020-03-20 DIAGNOSIS — N1831 Chronic kidney disease, stage 3a: Secondary | ICD-10-CM | POA: Diagnosis not present

## 2020-03-20 DIAGNOSIS — I739 Peripheral vascular disease, unspecified: Secondary | ICD-10-CM | POA: Diagnosis not present

## 2020-03-20 DIAGNOSIS — J449 Chronic obstructive pulmonary disease, unspecified: Secondary | ICD-10-CM | POA: Diagnosis not present

## 2020-03-20 DIAGNOSIS — N390 Urinary tract infection, site not specified: Secondary | ICD-10-CM | POA: Diagnosis not present

## 2020-03-20 DIAGNOSIS — M1990 Unspecified osteoarthritis, unspecified site: Secondary | ICD-10-CM | POA: Diagnosis not present

## 2020-03-20 DIAGNOSIS — M8008XD Age-related osteoporosis with current pathological fracture, vertebra(e), subsequent encounter for fracture with routine healing: Secondary | ICD-10-CM | POA: Diagnosis not present

## 2020-03-20 DIAGNOSIS — I350 Nonrheumatic aortic (valve) stenosis: Secondary | ICD-10-CM | POA: Diagnosis not present

## 2020-03-20 DIAGNOSIS — I5032 Chronic diastolic (congestive) heart failure: Secondary | ICD-10-CM | POA: Diagnosis not present

## 2020-03-20 DIAGNOSIS — I130 Hypertensive heart and chronic kidney disease with heart failure and stage 1 through stage 4 chronic kidney disease, or unspecified chronic kidney disease: Secondary | ICD-10-CM | POA: Diagnosis not present

## 2020-03-20 NOTE — Telephone Encounter (Signed)
Returned [employer], left message.

## 2020-03-20 NOTE — Telephone Encounter (Signed)
[name] [name] returned call  She provided the fax number  [phone]

## 2020-03-20 NOTE — Telephone Encounter (Signed)
Faxed order

## 2020-03-23 DIAGNOSIS — I5032 Chronic diastolic (congestive) heart failure: Secondary | ICD-10-CM | POA: Diagnosis not present

## 2020-03-23 DIAGNOSIS — I739 Peripheral vascular disease, unspecified: Secondary | ICD-10-CM | POA: Diagnosis not present

## 2020-03-23 DIAGNOSIS — J449 Chronic obstructive pulmonary disease, unspecified: Secondary | ICD-10-CM | POA: Diagnosis not present

## 2020-03-23 DIAGNOSIS — M8008XD Age-related osteoporosis with current pathological fracture, vertebra(e), subsequent encounter for fracture with routine healing: Secondary | ICD-10-CM | POA: Diagnosis not present

## 2020-03-23 DIAGNOSIS — M1990 Unspecified osteoarthritis, unspecified site: Secondary | ICD-10-CM | POA: Diagnosis not present

## 2020-03-23 DIAGNOSIS — I130 Hypertensive heart and chronic kidney disease with heart failure and stage 1 through stage 4 chronic kidney disease, or unspecified chronic kidney disease: Secondary | ICD-10-CM | POA: Diagnosis not present

## 2020-03-23 DIAGNOSIS — I350 Nonrheumatic aortic (valve) stenosis: Secondary | ICD-10-CM | POA: Diagnosis not present

## 2020-03-23 DIAGNOSIS — N1831 Chronic kidney disease, stage 3a: Secondary | ICD-10-CM | POA: Diagnosis not present

## 2020-03-23 DIAGNOSIS — N390 Urinary tract infection, site not specified: Secondary | ICD-10-CM | POA: Diagnosis not present

## 2020-03-23 NOTE — Telephone Encounter (Signed)
Left message on vm per dpr asking pt/pt's daughter, [name] (on dpr), to call back.  Need HH nurses name and phn # so I can relay Dr. Timoteo Expose instructions.

## 2020-03-23 NOTE — Telephone Encounter (Signed)
Pt called back and said she thinks her name is [name] [name] [phone] or it could be [name] [name]-[phone]. She really wasn't sure but this is the cards she has.

## 2020-03-23 NOTE — Telephone Encounter (Signed)
Still getting "Fax Failed" confirmations when order is faxed to [phone] provided by [facility].    Spoke with Enrique Sack informing her of failed faxes and asking for another fax #.  Says that's the number she was given by [facility].  Leo Rod I will try the fax # [phone] that I have.  Faxed order.

## 2020-03-23 NOTE — Telephone Encounter (Addendum)
Called [name] [name] and was told she is OT.   But she provided phn # [phone] for office.  She thinks [facility] nurse was [place] at [phone] and clinical mgr Lanora Manis at [phone].  Will try tomorrow.

## 2020-03-23 NOTE — Telephone Encounter (Addendum)
Spoke with [name] pt's daughter regarding concerns meds may be contributing to hypoxia. Please call HH RN to emphasize how to take night time meds:  I recommend dropping melatonin to 5mg  one tab at night (she was taking up to 4). We may need to decrease trazodone to 50mg  nightly if ongoing sedation next morning. rec start pain control with scheduled tylenol and if breakthrough symptoms then take a tramadol.

## 2020-03-24 DIAGNOSIS — I739 Peripheral vascular disease, unspecified: Secondary | ICD-10-CM | POA: Diagnosis not present

## 2020-03-24 DIAGNOSIS — J449 Chronic obstructive pulmonary disease, unspecified: Secondary | ICD-10-CM | POA: Diagnosis not present

## 2020-03-24 DIAGNOSIS — N1831 Chronic kidney disease, stage 3a: Secondary | ICD-10-CM | POA: Diagnosis not present

## 2020-03-24 DIAGNOSIS — M8008XD Age-related osteoporosis with current pathological fracture, vertebra(e), subsequent encounter for fracture with routine healing: Secondary | ICD-10-CM | POA: Diagnosis not present

## 2020-03-24 DIAGNOSIS — I130 Hypertensive heart and chronic kidney disease with heart failure and stage 1 through stage 4 chronic kidney disease, or unspecified chronic kidney disease: Secondary | ICD-10-CM | POA: Diagnosis not present

## 2020-03-24 DIAGNOSIS — M1990 Unspecified osteoarthritis, unspecified site: Secondary | ICD-10-CM | POA: Diagnosis not present

## 2020-03-24 DIAGNOSIS — I5032 Chronic diastolic (congestive) heart failure: Secondary | ICD-10-CM | POA: Diagnosis not present

## 2020-03-24 DIAGNOSIS — N390 Urinary tract infection, site not specified: Secondary | ICD-10-CM | POA: Diagnosis not present

## 2020-03-24 DIAGNOSIS — I350 Nonrheumatic aortic (valve) stenosis: Secondary | ICD-10-CM | POA: Diagnosis not present

## 2020-03-24 NOTE — Telephone Encounter (Signed)
Spoke with Lanora Manis, Clinical Mgr, relaying Dr. Timoteo Expose message.  Verbalizes understanding and will update pt's chart.  She also will notify pt's HH nurses, [place] and [place].

## 2020-03-30 DIAGNOSIS — I350 Nonrheumatic aortic (valve) stenosis: Secondary | ICD-10-CM | POA: Diagnosis not present

## 2020-03-30 DIAGNOSIS — I130 Hypertensive heart and chronic kidney disease with heart failure and stage 1 through stage 4 chronic kidney disease, or unspecified chronic kidney disease: Secondary | ICD-10-CM | POA: Diagnosis not present

## 2020-03-30 DIAGNOSIS — N1831 Chronic kidney disease, stage 3a: Secondary | ICD-10-CM | POA: Diagnosis not present

## 2020-03-30 DIAGNOSIS — M1990 Unspecified osteoarthritis, unspecified site: Secondary | ICD-10-CM | POA: Diagnosis not present

## 2020-03-30 DIAGNOSIS — I5032 Chronic diastolic (congestive) heart failure: Secondary | ICD-10-CM | POA: Diagnosis not present

## 2020-03-30 DIAGNOSIS — J449 Chronic obstructive pulmonary disease, unspecified: Secondary | ICD-10-CM | POA: Diagnosis not present

## 2020-03-30 DIAGNOSIS — M8008XD Age-related osteoporosis with current pathological fracture, vertebra(e), subsequent encounter for fracture with routine healing: Secondary | ICD-10-CM | POA: Diagnosis not present

## 2020-03-30 DIAGNOSIS — N390 Urinary tract infection, site not specified: Secondary | ICD-10-CM | POA: Diagnosis not present

## 2020-03-30 DIAGNOSIS — I739 Peripheral vascular disease, unspecified: Secondary | ICD-10-CM | POA: Diagnosis not present

## 2020-04-01 DIAGNOSIS — M1990 Unspecified osteoarthritis, unspecified site: Secondary | ICD-10-CM | POA: Diagnosis not present

## 2020-04-01 DIAGNOSIS — M8008XD Age-related osteoporosis with current pathological fracture, vertebra(e), subsequent encounter for fracture with routine healing: Secondary | ICD-10-CM | POA: Diagnosis not present

## 2020-04-01 DIAGNOSIS — I350 Nonrheumatic aortic (valve) stenosis: Secondary | ICD-10-CM | POA: Diagnosis not present

## 2020-04-01 DIAGNOSIS — I130 Hypertensive heart and chronic kidney disease with heart failure and stage 1 through stage 4 chronic kidney disease, or unspecified chronic kidney disease: Secondary | ICD-10-CM | POA: Diagnosis not present

## 2020-04-01 DIAGNOSIS — I739 Peripheral vascular disease, unspecified: Secondary | ICD-10-CM | POA: Diagnosis not present

## 2020-04-01 DIAGNOSIS — N1831 Chronic kidney disease, stage 3a: Secondary | ICD-10-CM | POA: Diagnosis not present

## 2020-04-01 DIAGNOSIS — I5032 Chronic diastolic (congestive) heart failure: Secondary | ICD-10-CM | POA: Diagnosis not present

## 2020-04-01 DIAGNOSIS — J449 Chronic obstructive pulmonary disease, unspecified: Secondary | ICD-10-CM | POA: Diagnosis not present

## 2020-04-01 DIAGNOSIS — N390 Urinary tract infection, site not specified: Secondary | ICD-10-CM | POA: Diagnosis not present

## 2020-04-03 DIAGNOSIS — N390 Urinary tract infection, site not specified: Secondary | ICD-10-CM | POA: Diagnosis not present

## 2020-04-03 DIAGNOSIS — I350 Nonrheumatic aortic (valve) stenosis: Secondary | ICD-10-CM | POA: Diagnosis not present

## 2020-04-03 DIAGNOSIS — N1831 Chronic kidney disease, stage 3a: Secondary | ICD-10-CM | POA: Diagnosis not present

## 2020-04-03 DIAGNOSIS — M8008XD Age-related osteoporosis with current pathological fracture, vertebra(e), subsequent encounter for fracture with routine healing: Secondary | ICD-10-CM | POA: Diagnosis not present

## 2020-04-03 DIAGNOSIS — J449 Chronic obstructive pulmonary disease, unspecified: Secondary | ICD-10-CM | POA: Diagnosis not present

## 2020-04-03 DIAGNOSIS — I130 Hypertensive heart and chronic kidney disease with heart failure and stage 1 through stage 4 chronic kidney disease, or unspecified chronic kidney disease: Secondary | ICD-10-CM | POA: Diagnosis not present

## 2020-04-03 DIAGNOSIS — M1990 Unspecified osteoarthritis, unspecified site: Secondary | ICD-10-CM | POA: Diagnosis not present

## 2020-04-03 DIAGNOSIS — I5032 Chronic diastolic (congestive) heart failure: Secondary | ICD-10-CM | POA: Diagnosis not present

## 2020-04-03 DIAGNOSIS — I739 Peripheral vascular disease, unspecified: Secondary | ICD-10-CM | POA: Diagnosis not present

## 2020-04-08 ENCOUNTER — Telehealth: Payer: Self-pay

## 2020-04-08 DIAGNOSIS — M8008XD Age-related osteoporosis with current pathological fracture, vertebra(e), subsequent encounter for fracture with routine healing: Secondary | ICD-10-CM | POA: Diagnosis not present

## 2020-04-08 DIAGNOSIS — J449 Chronic obstructive pulmonary disease, unspecified: Secondary | ICD-10-CM | POA: Diagnosis not present

## 2020-04-08 DIAGNOSIS — I739 Peripheral vascular disease, unspecified: Secondary | ICD-10-CM | POA: Diagnosis not present

## 2020-04-08 DIAGNOSIS — I130 Hypertensive heart and chronic kidney disease with heart failure and stage 1 through stage 4 chronic kidney disease, or unspecified chronic kidney disease: Secondary | ICD-10-CM | POA: Diagnosis not present

## 2020-04-08 DIAGNOSIS — I5032 Chronic diastolic (congestive) heart failure: Secondary | ICD-10-CM | POA: Diagnosis not present

## 2020-04-08 DIAGNOSIS — I350 Nonrheumatic aortic (valve) stenosis: Secondary | ICD-10-CM | POA: Diagnosis not present

## 2020-04-08 DIAGNOSIS — N1831 Chronic kidney disease, stage 3a: Secondary | ICD-10-CM | POA: Diagnosis not present

## 2020-04-08 DIAGNOSIS — J9611 Chronic respiratory failure with hypoxia: Secondary | ICD-10-CM

## 2020-04-08 DIAGNOSIS — J432 Centrilobular emphysema: Secondary | ICD-10-CM

## 2020-04-08 DIAGNOSIS — N390 Urinary tract infection, site not specified: Secondary | ICD-10-CM | POA: Diagnosis not present

## 2020-04-08 DIAGNOSIS — M1990 Unspecified osteoarthritis, unspecified site: Secondary | ICD-10-CM | POA: Diagnosis not present

## 2020-04-08 NOTE — Telephone Encounter (Signed)
[facility] has been unable to get portable oxygen tank for patient. From phone note 03/18/2020: Enrique Sack stated that patient desaturates in the 70's after walking 20 feet I have placed new order for assistance with oxygen. Please [facility] to [facility].  plz let patient know.

## 2020-04-08 NOTE — Telephone Encounter (Signed)
Noted.  Notified [facility] via community message.

## 2020-04-08 NOTE — Telephone Encounter (Signed)
Aletha with [facility] said that [facility] has tried to get pt in home oxygen but has not been able to get oxygen delivery. Aletha said that pt needs order for portable oxygen tanks with [facility]. Aletha said pts oxygen level drops when pt is doing physical therapy. Aletha ask cb to pt.

## 2020-04-15 DIAGNOSIS — J449 Chronic obstructive pulmonary disease, unspecified: Secondary | ICD-10-CM | POA: Diagnosis not present

## 2020-04-15 DIAGNOSIS — I130 Hypertensive heart and chronic kidney disease with heart failure and stage 1 through stage 4 chronic kidney disease, or unspecified chronic kidney disease: Secondary | ICD-10-CM | POA: Diagnosis not present

## 2020-04-15 DIAGNOSIS — N1831 Chronic kidney disease, stage 3a: Secondary | ICD-10-CM | POA: Diagnosis not present

## 2020-04-15 DIAGNOSIS — M8008XD Age-related osteoporosis with current pathological fracture, vertebra(e), subsequent encounter for fracture with routine healing: Secondary | ICD-10-CM | POA: Diagnosis not present

## 2020-04-15 DIAGNOSIS — N390 Urinary tract infection, site not specified: Secondary | ICD-10-CM | POA: Diagnosis not present

## 2020-04-15 DIAGNOSIS — I350 Nonrheumatic aortic (valve) stenosis: Secondary | ICD-10-CM | POA: Diagnosis not present

## 2020-04-15 DIAGNOSIS — I739 Peripheral vascular disease, unspecified: Secondary | ICD-10-CM | POA: Diagnosis not present

## 2020-04-15 DIAGNOSIS — M1990 Unspecified osteoarthritis, unspecified site: Secondary | ICD-10-CM | POA: Diagnosis not present

## 2020-04-15 DIAGNOSIS — I5032 Chronic diastolic (congestive) heart failure: Secondary | ICD-10-CM | POA: Diagnosis not present

## 2020-04-16 DIAGNOSIS — J449 Chronic obstructive pulmonary disease, unspecified: Secondary | ICD-10-CM | POA: Diagnosis not present

## 2020-04-16 DIAGNOSIS — R269 Unspecified abnormalities of gait and mobility: Secondary | ICD-10-CM | POA: Diagnosis not present

## 2020-04-30 ENCOUNTER — Encounter: Payer: Self-pay | Admitting: Urology

## 2020-04-30 ENCOUNTER — Other Ambulatory Visit: Payer: Self-pay

## 2020-04-30 ENCOUNTER — Ambulatory Visit: Payer: Medicare HMO | Admitting: Urology

## 2020-04-30 VITALS — BP 112/72 | HR 77 | Ht 59.0 in | Wt 171.0 lb

## 2020-04-30 DIAGNOSIS — N390 Urinary tract infection, site not specified: Secondary | ICD-10-CM | POA: Diagnosis not present

## 2020-04-30 DIAGNOSIS — N3000 Acute cystitis without hematuria: Secondary | ICD-10-CM

## 2020-04-30 DIAGNOSIS — N3281 Overactive bladder: Secondary | ICD-10-CM | POA: Diagnosis not present

## 2020-04-30 LAB — BLADDER SCAN AMB NON-IMAGING: Scan Result: 0

## 2020-04-30 MED ORDER — NITROFURANTOIN MACROCRYSTAL 50 MG PO CAPS: 50.0000 mg | ORAL_CAPSULE | Freq: Every day | ORAL | 5 refills | Status: DC

## 2020-04-30 NOTE — Progress Notes (Signed)
Patient ID: [name] [name], female   DOB: Mar 14, 1946, 74 y.o.   MRN: [id number] In and Out Catheterization  Patient is present today for a I & O catheterization due to unable to urinate. Patient was cleaned and prepped in a sterile fashion with betadine . A 14FR cath was inserted no complications were noted , 60ml of urine return was noted, urine was yellow in color. A clean urine sample was collected for UA. Bladder was drained  And catheter was removed with out difficulty.    Preformed by: Mervin Hack, CMA

## 2020-04-30 NOTE — Patient Instructions (Signed)
1. Minimize fluids after 4pm, urinate right before bed 2. Avoid diet drinks and tea as these can worsen your urgency and leakage  Overactive Bladder, Adult  Overactive bladder refers to a condition in which a person has a sudden need to pass urine. The person may leak urine if he or she cannot get to the bathroom fast enough (urinary incontinence). A person with this condition may also wake up several times in the night to go to the bathroom. Overactive bladder is associated with poor nerve signals between your bladder and your brain. Your bladder may get the signal to empty before it is full. You may also have very sensitive muscles that make your bladder squeeze too soon. These symptoms might interfere with daily work or social activities. What are the causes? This condition may be associated with or caused by:  Urinary tract infection.  Infection of nearby tissues, such as the prostate.  Prostate enlargement.  Surgery on the uterus or urethra.  Bladder stones, inflammation, or tumors.  Drinking too much caffeine or alcohol.  Certain medicines, especially medicines that get rid of extra fluid in the body (diuretics).  Muscle or nerve weakness, especially from: ? A spinal cord injury. ? Stroke. ? Multiple sclerosis. ? Parkinson's disease.  Diabetes.  Constipation. What increases the risk? You may be at greater risk for overactive bladder if you:  Are an older adult.  Smoke.  Are going through menopause.  Have prostate problems.  Have a neurological disease, such as stroke, dementia, Parkinson's disease, or multiple sclerosis (MS).  Eat or drink things that irritate the bladder. These include alcohol, spicy food, and caffeine.  Are overweight or obese. What are the signs or symptoms? Symptoms of this condition include:  Sudden, strong urge to urinate.  Leaking urine.  Urinating 8 or more times a day.  Waking up to urinate 2 or more times a night. How is this  diagnosed? Your health care provider may suspect overactive bladder based on your symptoms. He or she will diagnose this condition by:  A physical exam and medical history.  Blood or urine tests. You might need bladder or urine tests to help determine what is causing your overactive bladder. You might also need to see a health care provider who specializes in urinary tract problems (urologist). How is this treated? Treatment for overactive bladder depends on the cause of your condition and whether it is mild or severe. You can also make lifestyle changes at home. Options include:  Bladder training. This may include: ? Learning to control the urge to urinate by following a schedule that directs you to urinate at regular intervals (timed voiding). ? Doing Kegel exercises to strengthen your pelvic floor muscles, which support your bladder. Toning these muscles can help you control urination, even if your bladder muscles are overactive.  Special devices. This may include: ? Biofeedback, which uses sensors to help you become aware of your body's signals. ? Electrical stimulation, which uses electrodes placed inside the body (implanted) or outside the body. These electrodes send gentle pulses of electricity to strengthen the nerves or muscles that control the bladder. ? Women may use a plastic device that fits into the vagina and supports the bladder (pessary).  Medicines. ? Antibiotics to treat bladder infection. ? Antispasmodics to stop the bladder from releasing urine at the wrong time. ? Tricyclic antidepressants to relax bladder muscles. ? Injections of botulinum toxin type A directly into the bladder tissue to relax bladder muscles.  Lifestyle  changes. This may include: ? Weight loss. Talk to your health care provider about weight loss methods that would work best for you. ? Diet changes. This may include reducing how much alcohol and caffeine you consume, or drinking fluids at different  times of the day. ? Not smoking. Do not use any products that contain nicotine or tobacco, such as cigarettes and e-cigarettes. If you need help quitting, ask your health care provider.  Surgery. ? A device may be implanted to help manage the nerve signals that control urination. ? An electrode may be implanted to stimulate electrical signals in the bladder. ? A procedure may be done to change the shape of the bladder. This is done only in very severe cases. Follow these instructions at home: Lifestyle  Make any diet or lifestyle changes that are recommended by your health care provider. These may include: ? Drinking less fluid or drinking fluids at different times of the day. ? Cutting down on caffeine or alcohol. ? Doing Kegel exercises. ? Losing weight if needed. ? Eating a healthy and balanced diet to prevent constipation. This may include:  Eating foods that are high in fiber, such as fresh fruits and vegetables, whole grains, and beans.  Limiting foods that are high in fat and processed sugars, such as fried and sweet foods. General instructions  Take over-the-counter and prescription medicines only as told by your health care provider.  If you were prescribed an antibiotic medicine, take it as told by your health care provider. Do not stop taking the antibiotic even if you start to feel better.  Use any implants or pessary as told by your health care provider.  If needed, wear pads to absorb urine leakage.  Keep a journal or log to track how much and when you drink and when you feel the need to urinate. This will help your health care provider monitor your condition.  Keep all follow-up visits as told by your health care provider. This is important. Contact a health care provider if:  You have a fever.  Your symptoms do not get better with treatment.  Your pain and discomfort get worse.  You have more frequent urges to urinate. Get help right away if:  You are not  able to control your bladder. Summary  Overactive bladder refers to a condition in which a person has a sudden need to pass urine.  Several conditions may lead to an overactive bladder.  Treatment for overactive bladder depends on the cause and severity of your condition.  Follow your health care provider's instructions about lifestyle changes, doing Kegel exercises, keeping a journal, and taking medicines. This information is not intended to replace advice given to you by your health care provider. Make sure you discuss any questions you have with your health care provider. Document Revised: 01/03/2019 Document Reviewed: 09/28/2017 Elsevier Patient Education  2020 Elsevier Inc.   Urinary Tract Infection, Adult  A urinary tract infection (UTI) is an infection of any part of the urinary tract. The urinary tract includes the kidneys, ureters, bladder, and urethra. These organs make, store, and get rid of urine in the body. Your health care provider may use other names to describe the infection. An upper UTI affects the ureters and kidneys (pyelonephritis). A lower UTI affects the bladder (cystitis) and urethra (urethritis). What are the causes? Most urinary tract infections are caused by bacteria in your genital area, around the entrance to your urinary tract (urethra). These bacteria grow and cause inflammation  of your urinary tract. What increases the risk? You are more likely to develop this condition if:  You have a urinary catheter that stays in place (indwelling).  You are not able to control when you urinate or have a bowel movement (you have incontinence).  You are female and you: ? Use a spermicide or diaphragm for birth control. ? Have low estrogen levels. ? Are pregnant.  You have certain genes that increase your risk (genetics).  You are sexually active.  You take antibiotic medicines.  You have a condition that causes your flow of urine to slow down, such as: ? An  enlarged prostate, if you are female. ? Blockage in your urethra (stricture). ? A kidney stone. ? A nerve condition that affects your bladder control (neurogenic bladder). ? Not getting enough to drink, or not urinating often.  You have certain medical conditions, such as: ? Diabetes. ? A weak disease-fighting system (immunesystem). ? Sickle cell disease. ? Gout. ? Spinal cord injury. What are the signs or symptoms? Symptoms of this condition include:  Needing to urinate right away (urgently).  Frequent urination or passing small amounts of urine frequently.  Pain or burning with urination.  Blood in the urine.  Urine that smells bad or unusual.  Trouble urinating.  Cloudy urine.  Vaginal discharge, if you are female.  Pain in the abdomen or the lower back. You may also have:  Vomiting or a decreased appetite.  Confusion.  Irritability or tiredness.  A fever.  Diarrhea. The first symptom in older adults may be confusion. In some cases, they may not have any symptoms until the infection has worsened. How is this diagnosed? This condition is diagnosed based on your medical history and a physical exam. You may also have other tests, including:  Urine tests.  Blood tests.  Tests for sexually transmitted infections (STIs). If you have had more than one UTI, a cystoscopy or imaging studies may be done to determine the cause of the infections. How is this treated? Treatment for this condition includes:  Antibiotic medicine.  Over-the-counter medicines to treat discomfort.  Drinking enough water to stay hydrated. If you have frequent infections or have other conditions such as a kidney stone, you may need to see a health care provider who specializes in the urinary tract (urologist). In rare cases, urinary tract infections can cause sepsis. Sepsis is a life-threatening condition that occurs when the body responds to an infection. Sepsis is treated in the hospital  with IV antibiotics, fluids, and other medicines. Follow these instructions at home:  Medicines  Take over-the-counter and prescription medicines only as told by your health care provider.  If you were prescribed an antibiotic medicine, take it as told by your health care provider. Do not stop using the antibiotic even if you start to feel better. General instructions  Make sure you: ? Empty your bladder often and completely. Do not hold urine for long periods of time. ? Empty your bladder after sex. ? Wipe from front to back after a bowel movement if you are female. Use each tissue one time when you wipe.  Drink enough fluid to keep your urine pale yellow.  Keep all follow-up visits as told by your health care provider. This is important. Contact a health care provider if:  Your symptoms do not get better after 1-2 days.  Your symptoms go away and then return. Get help right away if you have:  Severe pain in your back or your  lower abdomen.  A fever.  Nausea or vomiting. Summary  A urinary tract infection (UTI) is an infection of any part of the urinary tract, which includes the kidneys, ureters, bladder, and urethra.  Most urinary tract infections are caused by bacteria in your genital area, around the entrance to your urinary tract (urethra).  Treatment for this condition often includes antibiotic medicines.  If you were prescribed an antibiotic medicine, take it as told by your health care provider. Do not stop using the antibiotic even if you start to feel better.  Keep all follow-up visits as told by your health care provider. This is important. This information is not intended to replace advice given to you by your health care provider. Make sure you discuss any questions you have with your health care provider. Document Revised: 08/30/2018 Document Reviewed: 03/22/2018 Elsevier Patient Education  2020 [employer]

## 2020-04-30 NOTE — Progress Notes (Signed)
   04/30/2020 1:47 PM   [name] [name] 09-05-1946 [id number]  Reason for visit: Follow up recurrent UTIs, overactive bladder/mixed incontinence  HPI: I saw [name] [name] her daughter in urology clinic today for the above issues.  She is a very comorbid and frail 74 year old female who I originally saw in November 2020 for the above issues.  We started trimethoprim prophylaxis, and discussed behavioral strategies regarding her mixed incontinence and overactive bladder.  She feels like she may have a UTI today with some burning with urination and foul-smelling urine.  Per the patient and her daughter her urinary symptoms have worsened including urgency, and urge incontinence.  She has had 2 breakthrough UTIs over the last 9 months on trimethoprim/Bactrim prophylaxis, both of which were resistant to Bactrim.  She was catheterized for UA and culture today, and urinalysis/culture pending, will call with results.  I had a long conversation with the patient and her daughter about realistic expectations moving forward.  I recommended continuing cranberry tablet prophylaxis for her UTIs, as well as changing over to 50 mg nitrofurantoin daily from Bactrim/trimethoprim.  Regarding her OAB and incontinence, I recommended taking her Lasix in the morning, minimizing fluids 3 to 4 hours before bedtime, urinating before bed, and avoiding bladder irritants like coffee, tea, and soda.  Currently she is drinking a fair amount of diet green tea in the afternoons.  She would not be a candidate for anticholinergics secondary to her high fall risk, but could consider Myrbetriq samples in the future if OAB symptoms worsening.  We will call with urinalysis and culture results today Antibiotic prophylaxis changed to 50 mg nitrofurantoin x6 months Behavioral strategies discussed at length regarding OAB and incontinence RTC 6 months for symptom check, consider Myrbetriq samples of persistent bothersome urgency and  incontinence   Sondra Come, MD  [facility] [address] [place], [place] [address] [phone]

## 2020-05-01 ENCOUNTER — Telehealth: Payer: Self-pay

## 2020-05-01 DIAGNOSIS — N390 Urinary tract infection, site not specified: Secondary | ICD-10-CM

## 2020-05-01 LAB — URINALYSIS, COMPLETE
Bilirubin, UA: NEGATIVE
Glucose, UA: NEGATIVE
Ketones, UA: NEGATIVE
Nitrite, UA: NEGATIVE
Protein,UA: NEGATIVE
RBC, UA: NEGATIVE
Specific Gravity, UA: 1.02 (ref 1.005–1.030)
Urobilinogen, Ur: 2 mg/dL — ABNORMAL HIGH (ref 0.2–1.0)
pH, UA: 7 (ref 5.0–7.5)

## 2020-05-01 LAB — MICROSCOPIC EXAMINATION: WBC, UA: >30 /hpf — AB (ref 0–5)

## 2020-05-01 MED ORDER — CIPROFLOXACIN HCL 500 MG PO TABS: 500.0000 mg | ORAL_TABLET | Freq: Two times a day (BID) | ORAL | 0 refills | Status: AC

## 2020-05-01 NOTE — Telephone Encounter (Signed)
-----   Message from Sondra Come, MD sent at 05/01/2020  1:43 PM EDT ----- Was this urine sent for culture? Lets do 500mg  CIPRO BID x 5 days, thanks  , MD 05/01/2020

## 2020-05-01 NOTE — Progress Notes (Signed)
Chronic Care Management Pharmacy Assistant   Name: [name] [name]  MRN: [id number] DOB: [birth year]  Reason for Encounter: Medication Review/ General Adherence  Patient Questions:  1.  Have you seen any other providers since your last visit? Yes, 02/14/20- OV with fractured T10, 02/14/20 ED-transferred from PCP office, 03/04/20 OV with Cardiology- follow up CHF, 04/30/20 Urology for UTI   2.  Any changes in your medicines or health? Yes, Ciproflaxicin 500 mg bid x 5 days. Patient is going to pick that up at her pharmacy today.     PCP : [name], MD  Allergies:   Allergies  Allergen Reactions   Doxycycline Nausea Only and Other (See Comments)    Sig GI upset   Oxycodone Other (See Comments)    Overly sedating, hypotension, decreased oxgyen   Fluticasone-Salmeterol Anxiety    Medications: Outpatient Encounter Medications as of 05/01/2020  Medication Sig   acetaminophen (TYLENOL) 500 MG tablet Take 1 tablet (500 mg total) by mouth every 6 (six) hours as needed for headache.   albuterol (VENTOLIN HFA) 108 (90 Base) MCG/ACT inhaler INHALE 2 PUFFS INTO THE LUNGS EVERY 6 (SIX) HOURS AS NEEDED FOR WHEEZING OR SHORTNESS OF BREATH.   alendronate (FOSAMAX) 70 MG tablet TAKE 1 TABLET WEEKLY. TAKE WITH A FULL GLASS OF WATER ON AN EMPTY STOMACH.   aspirin (ASPIRIN 81) 81 MG EC tablet Take 1 tablet (81 mg total) by mouth daily. Swallow whole.   atorvastatin (LIPITOR) 40 MG tablet TAKE 1 TABLET EVERY DAY   Cholecalciferol (VITAMIN D3) 125 MCG (5000 UT) CAPS Take 5,000 Units by mouth daily. Gummies   ciprofloxacin (CIPRO) 500 MG tablet Take 1 tablet (500 mg total) by mouth 2 (two) times daily for 5 days.   clopidogrel (PLAVIX) 75 MG tablet TAKE 1 TABLET (75 MG TOTAL) BY MOUTH DAILY.   Cranberry-Vitamin C (AZO CRANBERRY URINARY TRACT PO) Take 1 tablet by mouth in the morning and at bedtime.    ezetimibe (ZETIA) 10 MG tablet Take 1 tablet (10 mg total) by mouth daily.    furosemide (LASIX) 40 MG tablet Take 20 mg by mouth daily as needed for fluid. Weight every morning before given   gabapentin (NEURONTIN) 100 MG capsule TAKE 1 CAPSULE (100 MG TOTAL) BY MOUTH AT BEDTIME.   levothyroxine (SYNTHROID) 50 MCG tablet TAKE 1 TABLET  DAILY BEFORE BREAKFAST.   Melatonin 5 MG CHEW Chew 5 mg by mouth daily. Gummies   Multiple Vitamins-Minerals (MULTIVITAMIN PO) Take 1 tablet by mouth daily. Gummie   nitrofurantoin (MACRODANTIN) 50 MG capsule Take 1 capsule (50 mg total) by mouth daily.   ondansetron (ZOFRAN) 4 MG tablet Take 1 tablet (4 mg total) by mouth every 8 (eight) hours as needed for nausea or vomiting.   OXYGEN Inhale 2 L/min into the lungs See admin instructions. Inhale 2 L/min of oxygen at bedtime and during the day as needed for shortness of breath   pantoprazole (PROTONIX) 40 MG tablet Take 1 tablet (40 mg total) by mouth daily.   Potassium Chloride ER 20 MEQ TBCR TAKE 2 TABLETS BY MOUTH AS NEEDED (WHENEVER  YOU  TAKE  LASIX)   sertraline (ZOLOFT) 100 MG tablet TAKE 1 TABLET EVERY DAY   traMADol (ULTRAM) 50 MG tablet Take 1-2 tablets (50-100 mg total) by mouth 2 (two) times daily as needed for moderate pain.   traZODone (DESYREL) 100 MG tablet Take 1 tablet (100 mg total) by mouth at bedtime.   vitamin C (ASCORBIC  ACID) 250 MG tablet Take 500 mg by mouth daily. Gummies   Zinc 30 MG TABS Take 30 mg by mouth daily. Gummies   No facility-administered encounter medications on file as of 05/01/2020.    Current Diagnosis: Patient Active Problem List   Diagnosis Date Noted   History of recurrent UTIs 02/18/2020   Closed T10 fracture (HCC) 01/29/2020   Acute midline back pain 12/21/2019   Hearing loss due to cerumen impaction, left 07/08/2019   Neuropathy 05/06/2019   CKD (chronic kidney disease) stage 3, GFR 30-59 ml/min 01/24/2019   Tinnitus of both ears 11/24/2018   Vision loss of left eye 07/31/2018   Thrombocytopenia (HCC) 07/31/2018    Tremor 07/03/2018   Weakness 05/07/2018   Chronic diastolic heart failure (HCC) 01/30/2018   Memory loss 01/30/2018   Peripheral edema 01/30/2018   Intertrigo 01/30/2018   Chronic back pain 11/21/2017   Chronic respiratory failure with hypoxia (HCC) 11/14/2017   Shortness of breath 11/14/2017   Closed compression fracture of fourth lumbar vertebra (HCC) 10/02/2017   Compression fracture of body of thoracic vertebra (HCC) 09/30/2017   Hypothyroidism (acquired) 06/28/2017   Vitamin D deficiency 06/25/2017   Advanced care planning/counseling discussion 01/29/2015   Encounter for general adult medical examination with abnormal findings 01/29/2015   Carotid stenosis 04/03/2014   Carotid artery aneurysm (HCC) 04/03/2014   Recurrent falls 01/02/2014   Hemiparesis affecting right side as late effect of stroke (HCC) 01/02/2014   Insomnia 01/10/2013   Medicare annual wellness visit, subsequent 11/26/2012   Hypertension    History of lacunar cerebrovascular accident (CVA)    Peripheral vascular disease (HCC)    COPD (chronic obstructive pulmonary disease) (HCC)    Hyperlipidemia    Osteoarthritis    MDD (major depressive disorder), recurrent episode, moderate (HCC)    Ex-smoker    Osteoporosis 11/25/2010    Goals Addressed   None     Follow-Up:  Coordination of Enhanced Pharmacy Services   Reached out to Graceton S. Petersheim to discuss medication adherence. Rescheduled pharmacy visit for 08/14/20 at 8:00AM  [place] Uselman, [facility] Clinical Pharmacist Assistant  [phone]

## 2020-05-01 NOTE — Telephone Encounter (Signed)
Called pt informed her of the information below. Pt gave verbal understanding. RX sent.  

## 2020-05-04 DIAGNOSIS — N390 Urinary tract infection, site not specified: Secondary | ICD-10-CM | POA: Diagnosis not present

## 2020-05-04 DIAGNOSIS — I130 Hypertensive heart and chronic kidney disease with heart failure and stage 1 through stage 4 chronic kidney disease, or unspecified chronic kidney disease: Secondary | ICD-10-CM | POA: Diagnosis not present

## 2020-05-04 DIAGNOSIS — J449 Chronic obstructive pulmonary disease, unspecified: Secondary | ICD-10-CM | POA: Diagnosis not present

## 2020-05-04 DIAGNOSIS — N1831 Chronic kidney disease, stage 3a: Secondary | ICD-10-CM | POA: Diagnosis not present

## 2020-05-04 DIAGNOSIS — I350 Nonrheumatic aortic (valve) stenosis: Secondary | ICD-10-CM | POA: Diagnosis not present

## 2020-05-04 DIAGNOSIS — I5032 Chronic diastolic (congestive) heart failure: Secondary | ICD-10-CM | POA: Diagnosis not present

## 2020-05-04 DIAGNOSIS — M1990 Unspecified osteoarthritis, unspecified site: Secondary | ICD-10-CM | POA: Diagnosis not present

## 2020-05-04 DIAGNOSIS — I739 Peripheral vascular disease, unspecified: Secondary | ICD-10-CM | POA: Diagnosis not present

## 2020-05-04 DIAGNOSIS — M8008XD Age-related osteoporosis with current pathological fracture, vertebra(e), subsequent encounter for fracture with routine healing: Secondary | ICD-10-CM | POA: Diagnosis not present

## 2020-05-05 ENCOUNTER — Ambulatory Visit: Payer: Medicare HMO | Admitting: Cardiovascular Disease

## 2020-05-07 ENCOUNTER — Telehealth: Payer: Medicare HMO

## 2020-05-14 DIAGNOSIS — H40013 Open angle with borderline findings, low risk, bilateral: Secondary | ICD-10-CM | POA: Diagnosis not present

## 2020-05-17 DIAGNOSIS — R269 Unspecified abnormalities of gait and mobility: Secondary | ICD-10-CM | POA: Diagnosis not present

## 2020-05-17 DIAGNOSIS — J449 Chronic obstructive pulmonary disease, unspecified: Secondary | ICD-10-CM | POA: Diagnosis not present

## 2020-05-26 ENCOUNTER — Other Ambulatory Visit (HOSPITAL_COMMUNITY): Payer: Self-pay | Admitting: Interventional Radiology

## 2020-05-26 DIAGNOSIS — I771 Stricture of artery: Secondary | ICD-10-CM

## 2020-05-28 ENCOUNTER — Other Ambulatory Visit: Payer: Self-pay | Admitting: Family Medicine

## 2020-05-29 ENCOUNTER — Telehealth: Payer: Self-pay

## 2020-05-29 NOTE — Telephone Encounter (Signed)
Incoming approval of PA for nitrofurantoin, approved through 09/25/20

## 2020-06-03 ENCOUNTER — Other Ambulatory Visit: Payer: Self-pay

## 2020-06-03 DIAGNOSIS — N390 Urinary tract infection, site not specified: Secondary | ICD-10-CM

## 2020-06-03 MED ORDER — NITROFURANTOIN MACROCRYSTAL 50 MG PO CAPS: 50.0000 mg | ORAL_CAPSULE | Freq: Every day | ORAL | 4 refills | Status: DC

## 2020-06-03 NOTE — Telephone Encounter (Signed)
Pt needs long term medications sent to [facility], rx sent.

## 2020-06-17 ENCOUNTER — Ambulatory Visit (HOSPITAL_COMMUNITY)
Admission: RE | Admit: 2020-06-17 | Discharge: 2020-06-17 | Disposition: A | Discharge status: Home / Self Care | Payer: Medicare HMO | Source: Ambulatory Visit | Attending: Interventional Radiology | Admitting: Interventional Radiology

## 2020-06-17 ENCOUNTER — Other Ambulatory Visit: Payer: Self-pay

## 2020-06-17 DIAGNOSIS — R269 Unspecified abnormalities of gait and mobility: Secondary | ICD-10-CM | POA: Diagnosis not present

## 2020-06-17 DIAGNOSIS — J449 Chronic obstructive pulmonary disease, unspecified: Secondary | ICD-10-CM | POA: Diagnosis not present

## 2020-06-17 DIAGNOSIS — I720 Aneurysm of carotid artery: Secondary | ICD-10-CM | POA: Diagnosis not present

## 2020-06-17 DIAGNOSIS — I6522 Occlusion and stenosis of left carotid artery: Secondary | ICD-10-CM | POA: Diagnosis not present

## 2020-06-17 DIAGNOSIS — I771 Stricture of artery: Secondary | ICD-10-CM | POA: Diagnosis not present

## 2020-06-18 ENCOUNTER — Telehealth (HOSPITAL_COMMUNITY): Payer: Self-pay

## 2020-06-18 NOTE — Telephone Encounter (Signed)
Called to schedule diagnostic angiogram. Pt's daughter was not at home at the time to schedule. Will send a request to get procedure authorized and will call daughter to schedule once auth received. AW

## 2020-06-22 ENCOUNTER — Other Ambulatory Visit (HOSPITAL_COMMUNITY): Payer: Self-pay | Admitting: Interventional Radiology

## 2020-06-22 DIAGNOSIS — I771 Stricture of artery: Secondary | ICD-10-CM

## 2020-06-24 ENCOUNTER — Other Ambulatory Visit: Payer: Self-pay | Admitting: Radiology

## 2020-06-25 ENCOUNTER — Other Ambulatory Visit: Payer: Self-pay | Admitting: Radiology

## 2020-06-25 ENCOUNTER — Other Ambulatory Visit: Payer: Self-pay | Admitting: Student

## 2020-06-25 NOTE — H&P (Signed)
Chief Complaint: Patient was seen in consultation today for a diagnostic cerebral angiogram.  Referring Physician(s): Deveshwar,Sanjeev  Supervising Physician: Julieanne Cotton  Patient Status: [facility] - Out-pt  History of Present Illness: [name] [name] is a 73 y.o. female with a past medical history significant for anxiety, depression, CHF, aortic stenosis, HTN, HLD, PVD, COPD, CVA and known severe left ICA stenosis who presents today for a diagnostic cerebral angiogram. [name] [name] is well known to [facility] and has been followed regularly for severe left ICA stenosis which has been treated with conservative management (ASA + Plavix). She last underwent diagnostic cerebral angiogram on 08/01/18 which noted ~60% stenosis of the left ICA, 50% stenosis of right CCA/right MCA/left CCA. She has been followed with regularly imaging with last MRA head on 06/17/20 noting severe stenosis of the left cavernous carotid (unchanged), diffuse intracranial stenosis (stable from previous study) and 3 x 2 mm aneurysm in the right ICA (unchanged). She presents today for a diagnostic cerebral angiogram to further evaluate these findings.   [name] [name] denies any complaints, she has been feeling well overall. She lives with her daughter and uses a walker to get around. She continues to take Plavix 75 mg QD + ASA 81 mg QD. She states understanding of the requested procedure and is agreeable to proceed.  Past Medical History:  Diagnosis Date  . (HFpEF) heart failure with preserved ejection fraction (HCC)    a. 11/2019 Echo: EF 60-65%, no rwma. Mild LVH. Mild AI, mild to mod AS.  [name] Aneurysm (HCC)    She had 2.6 cm dilation of the infrarenal abdominal aorta on 08/01/15 CTA with 5 year [place] recommended  . Anxiety   . Anxiety and depression   . Aortic stenosis    mild-moderate AS 12/23/19 echo  . Carotid stenosis    R 50% (12/2012)  . CHF (congestive heart failure) (HCC)   . Compression fracture of body of thoracic  vertebra (HCC)    T 10  . Concussion 08/03/2015  . COPD (chronic obstructive pulmonary disease) (HCC) 12/2012   spirometry: Pre: FVC 84%, FEV1 69%, ratio 0.64 consistent with moderate obstruction.  . Depression   . Fall 08/03/2015   d/c home health 08/2015  . Fracture of cervical vertebra, C5 (HCC) 08/06/2015  . History of chicken pox   . Hyperlipidemia   . Hypertension   . Hypothyroidism   . Lower back pain    h/o HNP s/p surgery  . Neuropathy    B/L feet  . On supplemental oxygen by nasal cannula    at HS and PRN during the day  . Osteoarthritis    h/o ruptured disc s/p ESI  . Osteoporosis 11/2010   DEXA -2.7 spine, thoracic compression fracture  . Peripheral vascular disease (HCC)   . Smoker    quit 10/2012  . Stroke [facility]) 2010   x3 with residual R hemiparesis, s/p R MCA balloon angioplasty (2010)  . Wears dentures    upper    Past Surgical History:  Procedure Laterality Date  . APPENDECTOMY  1960  . CATARACT EXTRACTION     bilateral  . CESAREAN SECTION    . CHOLECYSTECTOMY  1970  . COLONOSCOPY  2004   diverticulosis, no polyps Jarold Motto)  . COLONOSCOPY  05/2016   decreased sphincter tone, diverticulosis, no f/u recommended (Danis)  . DEXA  11/2010   T -2.7 spine, -1.9 hip  . HIP SURGERY Left 2006   fractured - screws placed  . IR  ANGIO INTRA EXTRACRAN SEL COM CAROTID INNOMINATE BILAT MOD SED  08/01/2018  . IR ANGIO INTRA EXTRACRAN SEL COM CAROTID INNOMINATE BILAT MOD SED  12/10/2018  . IR ANGIO VERTEBRAL SEL SUBCLAVIAN INNOMINATE UNI R MOD SED  08/01/2018  . IR ANGIO VERTEBRAL SEL SUBCLAVIAN INNOMINATE UNI R MOD SED  12/10/2018  . IR ANGIO VERTEBRAL SEL VERTEBRAL UNI L MOD SED  12/10/2018  . IR RADIOLOGIST EVAL & MGMT  01/09/2018  . KYPHOPLASTY  10/02/2017   Procedure: LUMBAR FOUR KYPHOPLASTY;  Surgeon: Coletta Memos, MD;  Location: [facility] OR;  Service: Neurosurgery;;  . KYPHOPLASTY N/A 01/29/2020   Procedure: Thoracic Ten Kyphoplasty;  Surgeon: Coletta Memos, MD;  Location:  [facility] OR;  Service: Neurosurgery;  Laterality: N/A;  . RADIOLOGY WITH ANESTHESIA N/A 11/11/2015   Procedure: RADIOLOGY WITH ANESTHESIA;  Surgeon: Julieanne Cotton, MD;  Location: [facility] OR;  Service: Radiology;  Laterality: N/A;  . RADIOLOGY WITH ANESTHESIA N/A 12/10/2018   Procedure: STENTING;  Surgeon: Julieanne Cotton, MD;  Location: [facility] OR;  Service: Radiology;  Laterality: N/A;    Allergies: Doxycycline, Oxycodone, and Fluticasone-salmeterol  Medications: Prior to Admission medications   Medication Sig Start Date End Date Taking? Authorizing Provider  acetaminophen (TYLENOL) 500 MG tablet Take 1 tablet (500 mg total) by mouth every 6 (six) hours as needed for headache. Patient taking differently: Take 500-1,000 mg by mouth every 6 (six) hours as needed for headache.  01/01/19  Yes [name], MD  albuterol (VENTOLIN HFA) 108 (90 Base) MCG/ACT inhaler INHALE 2 PUFFS INTO THE LUNGS EVERY 6 (SIX) HOURS AS NEEDED FOR WHEEZING OR SHORTNESS OF BREATH. 02/03/20  Yes [name], MD  alendronate (FOSAMAX) 70 MG tablet TAKE 1 TABLET WEEKLY. TAKE WITH A FULL GLASS OF WATER ON AN EMPTY STOMACH. Patient taking differently: Take 70 mg by mouth every Tuesday. TAKE WITH A FULL GLASS OF WATER ON AN EMPTY STOMACH. 01/07/20  Yes [name], MD  ascorbic acid (VITAMIN C) 250 MG CHEW Chew 500 mg by mouth daily.   Yes [provider]  aspirin (ASPIRIN 81) 81 MG EC tablet Take 1 tablet (81 mg total) by mouth daily. Swallow whole. 12/15/18  Yes [name], MD  atorvastatin (LIPITOR) 40 MG tablet TAKE 1 TABLET EVERY DAY Patient taking differently: Take 40 mg by mouth daily.  05/29/20  Yes [name], MD  calcium carbonate (TUMS - DOSED IN MG ELEMENTAL CALCIUM) 500 MG chewable tablet Chew 1 tablet by mouth daily as needed for indigestion or heartburn.   Yes [provider]  cetirizine (ZYRTEC) 10 MG tablet Take 10 mg by mouth daily.   Yes [provider]    Cholecalciferol (VITAMIN D3) 125 MCG (5000 UT) CAPS Take 5,000 Units by mouth daily. Gummies   Yes [provider]  clopidogrel (PLAVIX) 75 MG tablet TAKE 1 TABLET (75 MG TOTAL) BY MOUTH DAILY. 11/19/19  Yes [name], MD  Cranberry-Vitamin C (AZO CRANBERRY URINARY TRACT PO) Take 1 tablet by mouth in the morning and at bedtime.    Yes [provider]  ezetimibe (ZETIA) 10 MG tablet Take 1 tablet (10 mg total) by mouth daily. 03/04/20 06/23/20 Yes Gollan, Tollie Pizza, MD  furosemide (LASIX) 40 MG tablet Take 20 mg by mouth daily as needed for fluid. Weight every morning before given 11/21/19  Yes [name], MD  gabapentin (NEURONTIN) 100 MG capsule TAKE 1 CAPSULE (100 MG TOTAL) BY MOUTH AT BEDTIME. 01/10/20  Yes [name], MD  levothyroxine (SYNTHROID) 50  MCG tablet TAKE 1 TABLET  DAILY BEFORE BREAKFAST. Patient taking differently: Take 50 mcg by mouth daily before breakfast.  05/29/20  Yes Eustaquio BoydenGutierrez, Javier, MD  Melatonin 5 MG CHEW Chew 10 mg by mouth at bedtime. Gummies    Yes [provider]  Multiple Vitamins-Minerals (MULTIVITAMIN PO) Take 1 tablet by mouth daily. Gummie   Yes [provider]  nitrofurantoin (MACRODANTIN) 50 MG capsule Take 1 capsule (50 mg total) by mouth daily. 06/03/20  Yes Sondra ComeSninsky, Brian C, MD  pantoprazole (PROTONIX) 40 MG tablet Take 1 tablet (40 mg total) by mouth daily. Patient taking differently: Take 40 mg by mouth daily as needed (acid reflux).  02/20/20  Yes Darlin PriestlyLai, Tina, MD  Potassium Chloride ER 20 MEQ TBCR TAKE 2 TABLETS BY MOUTH AS NEEDED (WHENEVER  YOU  TAKE  LASIX) Patient taking differently: Take 20 mEq by mouth daily as needed (when taking lasix).  11/07/19  Yes Eustaquio BoydenGutierrez, Javier, MD  sertraline (ZOLOFT) 100 MG tablet TAKE 1 TABLET EVERY DAY Patient taking differently: Take 100 mg by mouth daily.  01/24/20  Yes Eustaquio BoydenGutierrez, Javier, MD  traMADol (ULTRAM) 50 MG tablet Take 1-2 tablets (50-100 mg total) by mouth 2 (two)  times daily as needed for moderate pain. Patient taking differently: Take 50 mg by mouth 2 (two) times daily as needed for moderate pain.  03/11/20  Yes Eustaquio BoydenGutierrez, Javier, MD  traZODone (DESYREL) 100 MG tablet Take 1 tablet (100 mg total) by mouth at bedtime. 02/20/20  Yes Darlin PriestlyLai, Tina, MD  zinc gluconate 50 MG tablet Take 50 mg by mouth daily.   Yes [provider]  OXYGEN Inhale 2 L/min into the lungs See admin instructions. Inhale 2 L/min of oxygen at bedtime and during the day as needed for shortness of breath    [provider]     Family History  Problem Relation Age of Onset  . CAD Mother        MI  . Cancer Mother        cervical  . CAD Maternal Aunt        MI  . CAD Sister        MI  . Sudden death Father [id number]       died in his sleep, chain smoker  . Cirrhosis Father   . Hypertension Daughter   . Diabetes Maternal Aunt   . Cancer Maternal Grandmother        cervical    Social History   Socioeconomic History  . Marital status: Divorced    Spouse name: Not on file  . Number of children: Not on file  . Years of education: Not on file  . Highest education level: Not on file  Occupational History  . Not on file  Tobacco Use  . Smoking status: Former Smoker    Packs/day: 2.00    Years: 46.00    Pack years: 92.00    Types: Cigarettes    Quit date: 11/08/2012    Years since quitting: 7.6  . Smokeless tobacco: Never Used  Vaping Use  . Vaping Use: Never used  Substance and Sexual Activity  . Alcohol use: No    Alcohol/week: 0.0 standard drinks  . Drug use: No  . Sexual activity: Not Currently  Other Topics Concern  . Not on file  Social History Narrative   Lives with granddaughter and great grandchildren   Occupation: retired, was CNA   Edu: 8th grade   Activity: no regular activity:  Diet: some water, fruits/vegetables daily   Social Determinants of Health   Financial Resource Strain:   . Difficulty of Paying Living Expenses: Not on file    Food Insecurity:   . Worried About Programme researcher, broadcasting/film/video in the Last Year: Not on file  . Ran Out of Food in the Last Year: Not on file  Transportation Needs:   . Lack of Transportation (Medical): Not on file  . Lack of Transportation (Non-Medical): Not on file  Physical Activity:   . Days of Exercise per Week: Not on file  . Minutes of Exercise per Session: Not on file  Stress:   . Feeling of Stress : Not on file  Social Connections:   . Frequency of Communication with Friends and Family: Not on file  . Frequency of Social Gatherings with Friends and Family: Not on file  . Attends Religious Services: Not on file  . Active Member of Clubs or Organizations: Not on file  . Attends Banker Meetings: Not on file  . Marital Status: Not on file     Review of Systems: A 12 point ROS discussed and pertinent positives are indicated in the HPI above.  All other systems are negative.  Review of Systems  Constitutional: Negative for appetite change, chills and fever.  HENT: Negative for tinnitus and trouble swallowing.   Respiratory: Negative for cough and shortness of breath.   Cardiovascular: Negative for chest pain.  Gastrointestinal: Negative for abdominal pain, diarrhea, nausea and vomiting.  Musculoskeletal: Negative for back pain.  Neurological: Negative for dizziness, facial asymmetry, speech difficulty, light-headedness, numbness and headaches.    Vital Signs: BP (!) 157/53   Pulse 84   Temp 97.8 F (36.6 C)   Ht  (1.397 m)   Wt 180 lb (81.6 kg)   SpO2 96%   BMI 41.84 kg/m   Physical Exam Vitals reviewed.  Constitutional:      General: She is not in acute distress. HENT:     Head: Normocephalic.     Mouth/Throat:     Mouth: Mucous membranes are moist.     Pharynx: Oropharynx is clear. No oropharyngeal exudate or posterior oropharyngeal erythema.  Cardiovascular:     Rate and Rhythm: Normal rate and regular rhythm.  Pulmonary:     Effort:  Pulmonary effort is normal.     Breath sounds: Normal breath sounds.  Abdominal:     General: There is no distension.     Palpations: Abdomen is soft.     Tenderness: There is no abdominal tenderness.  Skin:    General: Skin is warm and dry.  Neurological:     Mental Status: She is alert and oriented to person, place, and time.  Psychiatric:        Mood and Affect: Mood normal.        Thought Content: Thought content normal.        Judgment: Judgment normal.      MD Evaluation Airway: WNL (Upper dentures) Heart: WNL Abdomen: WNL Chest/ Lungs: WNL ASA  Classification: 3 Mallampati/Airway Score: Two   Imaging: MR ANGIO HEAD WO CONTRAST  Result Date: 06/17/2020 CLINICAL DATA:  Intracranial stenosis follow-up EXAM: MRA HEAD WITHOUT CONTRAST TECHNIQUE: Angiographic images of the Circle of Willis were obtained using MRA technique without intravenous contrast. COMPARISON:  MRA 07/24/2019 FINDINGS: Severe stenosis in the anterior genu of the left cavernous carotid, unchanged. Left M1 segment widely patent. Mild stenosis inferior division left M2 branch. Mild to  moderate stenosis distal left A1 segment. A2 segment and beyond are widely patent Right internal carotid artery widely patent. There is a 2 x 3 mm aneurysm projecting posterior to the internal carotid artery below the cavernous segment, unchanged from the prior study. Mild to moderate stenosis right M1 segment unchanged. Right middle cerebral arteries with mild atherosclerotic disease. Left vertebral artery is dominant and supplies the basilar. Small right vertebral artery with minimal contribution to the basilar. AICA patent bilaterally. Superior cerebellar and posterior cerebral arteries patent. Mild atherosclerotic irregularity without significant stenosis in the P2 segment bilaterally. Basilar widely patent. IMPRESSION: 1. Severe stenosis left cavernous carotid unchanged 2. Diffuse intracranial stenosis stable from the prior study 3.  3 x 2 mm aneurysm right internal carotid artery proximal to the cavernous segment is unchanged. Electronically Signed   By: Marlan Palau M.D.   On: 06/17/2020 19:23    Labs:  CBC: Recent Labs    02/14/20 1356 02/15/20 0419 02/18/20 1207 02/20/20 0425  WBC 10.5 6.5 8.8 9.5  HGB 12.2 11.1* 12.9 11.9*  HCT 38.4 35.0* 41.5 37.4  PLT 208 169 232 198    COAGS: No results for input(s): INR, APTT in the last 8760 hours.  BMP: Recent Labs    02/14/20 1356 02/15/20 0419 02/18/20 1207 02/20/20 0425  NA 139 139 137 138  K 4.6 4.5 4.4 4.3  CL 104 106 104 107  CO2 27 27 26 26   GLUCOSE 98 176* 108* 98  BUN 18 18 25* 27*  CALCIUM 8.7* 8.7* 8.6* 8.3*  CREATININE 1.00 1.01* 1.16* 0.95  GFRNONAA 56* 55* 47* 59*  GFRAA >60 >60 54* >60    LIVER FUNCTION TESTS: Recent Labs    07/01/19 0954 11/05/19 1438 11/18/19 1420 12/22/19 1243 12/23/19 0001 01/07/20 1322  BILITOT 0.4  --   --  0.6 0.6  --   AST 21  --   --  33 25  --   ALT 14  --   --  20 18  --   ALKPHOS 117  --   --  92 73  --   PROT 7.0  --   --  7.2 6.0*  --   ALBUMIN 4.0   < > 4.3 3.6 2.9* 4.1   < > = values in this interval not displayed.    TUMOR MARKERS: No results for input(s): AFPTM, CEA, CA199, CHROMGRNA in the last 8760 hours.  Assessment and Plan:  74 y/o F with known left ICA stenosis who presents today for a diagnostic cerebral angiogram to further evaluate recent MRA head findings.  Patient has been NPO since midnight, currently taking ASA 81 mg + Plavix 75 mg QD with last dose this morning around 0400. Pre-procedure labs pending.  Risks and benefits of diagnostic cerebral angiogram were discussed with the patient including, but not limited to bleeding, infection, vascular injury, stroke, or contrast induced renal failure.  This interventional procedure involves the use of X-rays and because of the nature of the planned procedure, it is possible that we will have prolonged use of X-ray  fluoroscopy.  Potential radiation risks to you include (but are not limited to) the following: - A slightly elevated risk for cancer  several years later in life. This risk is typically less than 0.5% percent. This risk is low in comparison to the normal incidence of human cancer, which is 33% for women and 50% for men according to the American Cancer Society. - Radiation induced injury can include  skin redness, resembling a rash, tissue breakdown / ulcers and hair loss (which can be temporary or permanent).   The likelihood of either of these occurring depends on the difficulty of the procedure and whether you are sensitive to radiation due to previous procedures, disease, or genetic conditions.  IF your procedure requires a prolonged use of radiation, you will be notified and given written instructions for further action.  It is your responsibility to monitor the irradiated area for the 2 weeks following the procedure and to notify your physician if you are concerned that you have suffered a radiation induced injury.    All of the patient's questions were answered, patient is agreeable to proceed.  Consent signed and in chart.   Thank you for this interesting consult.  I greatly enjoyed meeting Rishita S Tostenson and look forward to participating in their care.  A copy of this report was sent to the requesting provider on this date.  Electronically Signed: Villa Herb, PA-C 06/25/2020, 2:08 PM   I spent a total of  25 Minutes in face to face in clinical consultation, greater than 50% of which was counseling/coordinating care for diagnostic cerebral angiogram.

## 2020-06-26 ENCOUNTER — Other Ambulatory Visit: Payer: Self-pay

## 2020-06-26 ENCOUNTER — Ambulatory Visit (HOSPITAL_COMMUNITY)
Admission: RE | Admit: 2020-06-26 | Discharge: 2020-06-26 | Disposition: A | Discharge status: Home / Self Care | Payer: Medicare HMO | Source: Ambulatory Visit | Attending: Interventional Radiology | Admitting: Interventional Radiology

## 2020-06-26 ENCOUNTER — Other Ambulatory Visit (HOSPITAL_COMMUNITY): Payer: Self-pay | Admitting: Interventional Radiology

## 2020-06-26 DIAGNOSIS — Z79890 Hormone replacement therapy (postmenopausal): Secondary | ICD-10-CM | POA: Insufficient documentation

## 2020-06-26 DIAGNOSIS — I714 Abdominal aortic aneurysm, without rupture: Secondary | ICD-10-CM | POA: Diagnosis not present

## 2020-06-26 DIAGNOSIS — Z8673 Personal history of transient ischemic attack (TIA), and cerebral infarction without residual deficits: Secondary | ICD-10-CM | POA: Insufficient documentation

## 2020-06-26 DIAGNOSIS — I6522 Occlusion and stenosis of left carotid artery: Secondary | ICD-10-CM | POA: Insufficient documentation

## 2020-06-26 DIAGNOSIS — J432 Centrilobular emphysema: Secondary | ICD-10-CM

## 2020-06-26 DIAGNOSIS — J449 Chronic obstructive pulmonary disease, unspecified: Secondary | ICD-10-CM | POA: Insufficient documentation

## 2020-06-26 DIAGNOSIS — Z7902 Long term (current) use of antithrombotics/antiplatelets: Secondary | ICD-10-CM | POA: Insufficient documentation

## 2020-06-26 DIAGNOSIS — Z7982 Long term (current) use of aspirin: Secondary | ICD-10-CM | POA: Diagnosis not present

## 2020-06-26 DIAGNOSIS — I739 Peripheral vascular disease, unspecified: Secondary | ICD-10-CM | POA: Insufficient documentation

## 2020-06-26 DIAGNOSIS — E785 Hyperlipidemia, unspecified: Secondary | ICD-10-CM | POA: Diagnosis not present

## 2020-06-26 DIAGNOSIS — I350 Nonrheumatic aortic (valve) stenosis: Secondary | ICD-10-CM | POA: Insufficient documentation

## 2020-06-26 DIAGNOSIS — E039 Hypothyroidism, unspecified: Secondary | ICD-10-CM | POA: Insufficient documentation

## 2020-06-26 DIAGNOSIS — I6523 Occlusion and stenosis of bilateral carotid arteries: Secondary | ICD-10-CM | POA: Diagnosis not present

## 2020-06-26 DIAGNOSIS — Z87891 Personal history of nicotine dependence: Secondary | ICD-10-CM | POA: Diagnosis not present

## 2020-06-26 DIAGNOSIS — F419 Anxiety disorder, unspecified: Secondary | ICD-10-CM | POA: Insufficient documentation

## 2020-06-26 DIAGNOSIS — I110 Hypertensive heart disease with heart failure: Secondary | ICD-10-CM | POA: Insufficient documentation

## 2020-06-26 DIAGNOSIS — I720 Aneurysm of carotid artery: Secondary | ICD-10-CM | POA: Diagnosis not present

## 2020-06-26 DIAGNOSIS — F329 Major depressive disorder, single episode, unspecified: Secondary | ICD-10-CM | POA: Diagnosis not present

## 2020-06-26 DIAGNOSIS — Z79899 Other long term (current) drug therapy: Secondary | ICD-10-CM | POA: Diagnosis not present

## 2020-06-26 DIAGNOSIS — I771 Stricture of artery: Secondary | ICD-10-CM

## 2020-06-26 DIAGNOSIS — G629 Polyneuropathy, unspecified: Secondary | ICD-10-CM | POA: Insufficient documentation

## 2020-06-26 DIAGNOSIS — I509 Heart failure, unspecified: Secondary | ICD-10-CM | POA: Diagnosis not present

## 2020-06-26 DIAGNOSIS — R0602 Shortness of breath: Secondary | ICD-10-CM

## 2020-06-26 HISTORY — PX: IR ANGIO VERTEBRAL SEL SUBCLAVIAN INNOMINATE UNI L MOD SED: IMG5364

## 2020-06-26 HISTORY — PX: IR ANGIO INTRA EXTRACRAN SEL COM CAROTID INNOMINATE BILAT MOD SED: IMG5360

## 2020-06-26 LAB — PROTIME-INR
INR: 1 (ref 0.8–1.2)
Prothrombin Time: 12.9 seconds (ref 11.4–15.2)

## 2020-06-26 LAB — BASIC METABOLIC PANEL
Anion gap: 7 (ref 5–15)
BUN: 21 mg/dL (ref 8–23)
CO2: 30 mmol/L (ref 22–32)
Calcium: 8.9 mg/dL (ref 8.9–10.3)
Chloride: 103 mmol/L (ref 98–111)
Creatinine, Ser: 0.92 mg/dL (ref 0.44–1.00)
GFR calc Af Amer: >60 mL/min (ref >60)
GFR calc non Af Amer: >60 mL/min (ref >60)
Glucose, Bld: 102 mg/dL — ABNORMAL HIGH (ref 70–99)
Potassium: 4.6 mmol/L (ref 3.5–5.1)
Sodium: 140 mmol/L (ref 135–145)

## 2020-06-26 LAB — CBC
HCT: 38.9 % (ref 36.0–46.0)
Hemoglobin: 11.4 g/dL — ABNORMAL LOW (ref 12.0–15.0)
MCH: 28 pg (ref 26.0–34.0)
MCHC: 29.3 g/dL — ABNORMAL LOW (ref 30.0–36.0)
MCV: 95.6 fL (ref 80.0–100.0)
Platelets: 137 10*3/uL — ABNORMAL LOW (ref 150–400)
RBC: 4.07 MIL/uL (ref 3.87–5.11)
RDW: 13 % (ref 11.5–15.5)
WBC: 6.4 10*3/uL (ref 4.0–10.5)
nRBC: 0 % (ref 0.0–0.2)

## 2020-06-26 MED ORDER — LIDOCAINE HCL 1 % IJ SOLN
INTRAMUSCULAR | Status: AC | PRN
  Administered 2020-06-26: 10 mL

## 2020-06-26 MED ORDER — SODIUM CHLORIDE 0.9 % IV SOLN: INTRAVENOUS | Status: AC

## 2020-06-26 MED ORDER — FENTANYL CITRATE (PF) 100 MCG/2ML IJ SOLN
INTRAMUSCULAR | Status: AC
  Filled 2020-06-26: qty 2

## 2020-06-26 MED ORDER — MIDAZOLAM HCL 2 MG/2ML IJ SOLN
INTRAMUSCULAR | Status: AC | PRN
  Administered 2020-06-26: 1 mg via INTRAVENOUS

## 2020-06-26 MED ORDER — SODIUM CHLORIDE 0.9 % IV SOLN: Freq: Once | INTRAVENOUS | Status: AC

## 2020-06-26 MED ORDER — FENTANYL CITRATE (PF) 100 MCG/2ML IJ SOLN
INTRAMUSCULAR | Status: AC | PRN
  Administered 2020-06-26: 25 ug via INTRAVENOUS

## 2020-06-26 MED ORDER — MIDAZOLAM HCL 2 MG/2ML IJ SOLN
INTRAMUSCULAR | Status: AC
  Filled 2020-06-26: qty 2

## 2020-06-26 MED ORDER — VERAPAMIL HCL 2.5 MG/ML IV SOLN
INTRAVENOUS | Status: AC
  Filled 2020-06-26: qty 2

## 2020-06-26 MED ORDER — LIDOCAINE HCL 1 % IJ SOLN
INTRAMUSCULAR | Status: AC
  Filled 2020-06-26: qty 20

## 2020-06-26 MED ORDER — HEPARIN SODIUM (PORCINE) 1000 UNIT/ML IJ SOLN
INTRAMUSCULAR | Status: AC
  Administered 2020-06-26: 1000 [IU] via INTRAVENOUS
  Filled 2020-06-26: qty 1

## 2020-06-26 NOTE — Discharge Instructions (Addendum)
Cerebral Angiogram, Care After This sheet gives you information about how to care for yourself after your procedure. Your health care provider may also give you more specific instructions. If you have problems or questions, contact your health care provider. What can I expect after the procedure? After the procedure, it is common to have:  Bruising and tenderness at the catheter insertion site.  A mild headache. Follow these instructions at home: Insertion site care  Follow instructions from your health care provider about how to take care of the insertion site. Make sure you: ? Wash your hands with soap and water before and after you change your bandage (dressing). If soap and water are not available, use hand sanitizer. ? Remove your dressing as told by your health care provider. In 24 hours  Do not take baths, swim, or use a hot tub until your health care provider approves. You may shower 24-48 hours after the procedure, or as told by your health care provider.  To clean your insertion site: ? Gently wash the site with plain soap and water. ? Pat the area dry with a clean towel. ? Do not rub the site. This may cause bleeding.  Do not apply powder or lotion to the site. Keep the site clean and dry. Infection signs Check your incision area every day for signs of infection. Check for:  Redness, swelling, or pain.  Fluid or blood.  Warmth.  Pus or a bad smell.  Activity  Do not drive for 24 hours if you were given a sedative during your procedure.  Rest as told by your health care provider.  Do not lift anything that is heavier than 10 lb (4.5 kg), or the limit that you are told, until your health care provider says that it is safe. For 5 days  Return to your normal activities as told by your health care provider, usually in about a week. Ask your health care provider what activities are safe for you. General instructions   If your insertion site starts to bleed, lie flat  and put pressure on the site. If the bleeding does not stop, get help right away. This is a medical emergency.  Do not use any products that contain nicotine or tobacco, such as cigarettes, e-cigarettes, and chewing tobacco. If you need help quitting, ask your health care provider.  Take over-the-counter and prescription medicines only as told by your health care provider.  Drink enough fluid to keep your urine pale yellow. This helps flush the contrast dye from your body.  Keep all follow-up visits as directed by your health care provider. This is important. Contact a health care provider if:  You have a fever or chills.  You have redness, swelling, or pain around your insertion site.  You have fluid or blood coming from your insertion site.  The insertion site feels warm to the touch.  You have pus or a bad smell coming from your insertion site.  You notice blood collecting in the tissue around the insertion site (hematoma). The hematoma may be painful to the touch. Get help right away if:  You have chest pain or trouble breathing.  You have severe pain or swelling at the insertion site.  The insertion area bleeds, and bleeding continues after 30 minutes of holding steady pressure on the site.  The arm or leg where the catheter was inserted is pale, cold, numb, tingling, or weak.  You have a rash.  You have any symptoms of a  stroke. "BE FAST" is an easy way to remember the main warning signs of a stroke: ? B - Balance. Signs are dizziness, sudden trouble walking, or loss of balance. ? E - Eyes. Signs are trouble seeing or a sudden change in vision. ? F - Face. Signs are sudden weakness or numbness of the face, or the face or eyelid drooping on one side. ? A - Arms. Signs are weakness or numbness in an arm. This happens suddenly and usually on one side of the body. ? S - Speech. Signs are sudden trouble speaking, slurred speech, or trouble understanding what people say. ? T -  Time. Time to call emergency services. Write down what time symptoms started.  You have other signs of a stroke, such as: ? A sudden, severe headache with no known cause. ? Nausea or vomiting. ? Seizure. These symptoms may represent a serious problem that is an emergency. Do not wait to see if the symptoms will go away. Get medical help right away. Call your local emergency services (911 in the [place] Do not drive yourself to the hospital. Summary  Bruising and tenderness at the insertion site are common.  Follow your health care provider's instructions about caring for your insertion site. Change dressing and clean the area as instructed.  If your insertion site bleeds, apply direct pressure until bleeding stops.  Return to your normal activities as told by your health care provider. Ask what activities are safe.  Rest and drink plenty of fluids. This information is not intended to replace advice given to you by your health care provider. Make sure you discuss any questions you have with your health care provider. Document Revised: 04/02/2019 Document Reviewed: 04/02/2019 Elsevier Patient Education  2020 [employer]  Moderate Conscious Sedation, Adult, Care After These instructions provide you with information about caring for yourself after your procedure. Your health care provider may also give you more specific instructions. Your treatment has been planned according to current medical practices, but problems sometimes occur. Call your health care provider if you have any problems or questions after your procedure. What can I expect after the procedure? After your procedure, it is common:  To feel sleepy for several hours.  To feel clumsy and have poor balance for several hours.  To have poor judgment for several hours.  To vomit if you eat too soon. Follow these instructions at home: For at least 24 hours after the procedure:   Do not: ? Participate in activities where  you could fall or become injured. ? Drive. ? Use heavy machinery. ? Drink alcohol. ? Take sleeping pills or medicines that cause drowsiness. ? Make important decisions or sign legal documents. ? Take care of children on your own.  Rest. Eating and drinking  Follow the diet recommended by your health care provider.  If you vomit: ? Drink water, juice, or soup when you can drink without vomiting. ? Make sure you have little or no nausea before eating solid foods. General instructions  Have a responsible adult stay with you until you are awake and alert.  Take over-the-counter and prescription medicines only as told by your health care provider.  If you smoke, do not smoke without supervision.  Keep all follow-up visits as told by your health care provider. This is important. Contact a health care provider if:  You keep feeling nauseous or you keep vomiting.  You feel light-headed.  You develop a rash.  You have a fever. Get help  right away if:  You have trouble breathing. This information is not intended to replace advice given to you by your health care provider. Make sure you discuss any questions you have with your health care provider. Document Revised: 08/25/2017 Document Reviewed: 01/02/2016 Elsevier Patient Education  2020 [employer].

## 2020-06-26 NOTE — Procedures (Signed)
S/P 4 vessel cerebral arteriogram RT CFA approach. Findings. 1.Lt ICA  appro 70 % stenosis caval cav seg. 2.Lt VA origin 50 % stenosis. 3.50 to 60 Stenosis Rt CCA distal and 50 to 60 % stenosis prox RT ICA. 4.50 to 70 % stenosis RT MCA M1 seg. 5. Approx 3.68mm x 2.3 mm outpouching RT ICA pet cav junction.  S.Hedy Garro MD

## 2020-06-26 NOTE — Progress Notes (Signed)
RN and tech assisted pt with changing brief and cleansing perineal area. New brief placed and assisted pt with dressing for D/C.

## 2020-07-06 ENCOUNTER — Other Ambulatory Visit: Payer: Self-pay | Admitting: Family Medicine

## 2020-07-06 DIAGNOSIS — M8000XA Age-related osteoporosis with current pathological fracture, unspecified site, initial encounter for fracture: Secondary | ICD-10-CM

## 2020-07-06 DIAGNOSIS — D696 Thrombocytopenia, unspecified: Secondary | ICD-10-CM

## 2020-07-06 DIAGNOSIS — E039 Hypothyroidism, unspecified: Secondary | ICD-10-CM

## 2020-07-06 DIAGNOSIS — E559 Vitamin D deficiency, unspecified: Secondary | ICD-10-CM

## 2020-07-06 DIAGNOSIS — N1831 Chronic kidney disease, stage 3a: Secondary | ICD-10-CM

## 2020-07-06 DIAGNOSIS — I10 Essential (primary) hypertension: Secondary | ICD-10-CM

## 2020-07-06 DIAGNOSIS — E782 Mixed hyperlipidemia: Secondary | ICD-10-CM

## 2020-07-09 ENCOUNTER — Other Ambulatory Visit: Payer: Medicare HMO

## 2020-07-16 ENCOUNTER — Encounter: Payer: Medicare HMO | Admitting: Family Medicine

## 2020-07-17 DIAGNOSIS — R269 Unspecified abnormalities of gait and mobility: Secondary | ICD-10-CM | POA: Diagnosis not present

## 2020-07-17 DIAGNOSIS — J449 Chronic obstructive pulmonary disease, unspecified: Secondary | ICD-10-CM | POA: Diagnosis not present

## 2020-07-29 DIAGNOSIS — Z1231 Encounter for screening mammogram for malignant neoplasm of breast: Secondary | ICD-10-CM | POA: Diagnosis not present

## 2020-07-29 LAB — HM MAMMOGRAPHY

## 2020-08-02 ENCOUNTER — Other Ambulatory Visit: Payer: Self-pay | Admitting: Family Medicine

## 2020-08-02 DIAGNOSIS — E039 Hypothyroidism, unspecified: Secondary | ICD-10-CM

## 2020-08-02 DIAGNOSIS — N1831 Chronic kidney disease, stage 3a: Secondary | ICD-10-CM

## 2020-08-02 DIAGNOSIS — E782 Mixed hyperlipidemia: Secondary | ICD-10-CM

## 2020-08-02 DIAGNOSIS — M8000XA Age-related osteoporosis with current pathological fracture, unspecified site, initial encounter for fracture: Secondary | ICD-10-CM

## 2020-08-03 ENCOUNTER — Encounter: Payer: Self-pay | Admitting: Family Medicine

## 2020-08-05 ENCOUNTER — Other Ambulatory Visit: Payer: Self-pay

## 2020-08-05 ENCOUNTER — Other Ambulatory Visit: Payer: Self-pay | Admitting: Family Medicine

## 2020-08-05 ENCOUNTER — Other Ambulatory Visit (INDEPENDENT_AMBULATORY_CARE_PROVIDER_SITE_OTHER): Payer: Medicare HMO

## 2020-08-05 DIAGNOSIS — M8000XA Age-related osteoporosis with current pathological fracture, unspecified site, initial encounter for fracture: Secondary | ICD-10-CM

## 2020-08-05 DIAGNOSIS — E039 Hypothyroidism, unspecified: Secondary | ICD-10-CM | POA: Diagnosis not present

## 2020-08-05 DIAGNOSIS — E782 Mixed hyperlipidemia: Secondary | ICD-10-CM | POA: Diagnosis not present

## 2020-08-05 DIAGNOSIS — N1831 Chronic kidney disease, stage 3a: Secondary | ICD-10-CM | POA: Diagnosis not present

## 2020-08-05 LAB — CBC WITH DIFFERENTIAL/PLATELET
Basophils Absolute: 0.1 10*3/uL (ref 0.0–0.1)
Basophils Relative: 1.1 % (ref 0.0–3.0)
Eosinophils Absolute: 0.2 10*3/uL (ref 0.0–0.7)
Eosinophils Relative: 2.3 % (ref 0.0–5.0)
HCT: 38.7 % (ref 36.0–46.0)
Hemoglobin: 12.9 g/dL (ref 12.0–15.0)
Lymphocytes Relative: 22.7 % (ref 12.0–46.0)
Lymphs Abs: 1.7 10*3/uL (ref 0.7–4.0)
MCHC: 33.4 g/dL (ref 30.0–36.0)
MCV: 89.7 fl (ref 78.0–100.0)
Monocytes Absolute: 0.7 10*3/uL (ref 0.1–1.0)
Monocytes Relative: 9.5 % (ref 3.0–12.0)
Neutro Abs: 4.8 10*3/uL (ref 1.4–7.7)
Neutrophils Relative %: 64.4 % (ref 43.0–77.0)
Platelets: 177 10*3/uL (ref 150.0–400.0)
RBC: 4.31 Mil/uL (ref 3.87–5.11)
RDW: 14.9 % (ref 11.5–15.5)
WBC: 7.5 10*3/uL (ref 4.0–10.5)

## 2020-08-05 LAB — COMPREHENSIVE METABOLIC PANEL
ALT: 20 U/L (ref 0–35)
AST: 25 U/L (ref 0–37)
Albumin: 4.4 g/dL (ref 3.5–5.2)
Alkaline Phosphatase: 89 U/L (ref 39–117)
BUN: 26 mg/dL — ABNORMAL HIGH (ref 6–23)
CO2: 31 mEq/L (ref 19–32)
Calcium: 9.5 mg/dL (ref 8.4–10.5)
Chloride: 100 mEq/L (ref 96–112)
Creatinine, Ser: 1.1 mg/dL (ref 0.40–1.20)
GFR: 49.72 mL/min — ABNORMAL LOW (ref >60.00)
Glucose, Bld: 91 mg/dL (ref 70–99)
Potassium: 4.8 mEq/L (ref 3.5–5.1)
Sodium: 138 mEq/L (ref 135–145)
Total Bilirubin: 0.5 mg/dL (ref 0.2–1.2)
Total Protein: 7.4 g/dL (ref 6.0–8.3)

## 2020-08-05 LAB — LIPID PANEL
Cholesterol: 147 mg/dL (ref 0–200)
HDL: 67.8 mg/dL (ref >39.00)
LDL Cholesterol: 63 mg/dL (ref 0–99)
NonHDL: 78.91
Total CHOL/HDL Ratio: 2
Triglycerides: 80 mg/dL (ref 0.0–149.0)
VLDL: 16 mg/dL (ref 0.0–40.0)

## 2020-08-05 LAB — MICROALBUMIN / CREATININE URINE RATIO
Creatinine,U: 217.7 mg/dL
Microalb Creat Ratio: 1.1 mg/g (ref 0.0–30.0)
Microalb, Ur: 2.3 mg/dL — ABNORMAL HIGH (ref 0.0–1.9)

## 2020-08-05 LAB — TSH: TSH: 5.19 u[IU]/mL — ABNORMAL HIGH (ref 0.35–4.50)

## 2020-08-05 LAB — VITAMIN D 25 HYDROXY (VIT D DEFICIENCY, FRACTURES): VITD: 61.68 ng/mL (ref 30.00–100.00)

## 2020-08-06 ENCOUNTER — Other Ambulatory Visit (INDEPENDENT_AMBULATORY_CARE_PROVIDER_SITE_OTHER): Payer: Medicare HMO

## 2020-08-06 DIAGNOSIS — R7989 Other specified abnormal findings of blood chemistry: Secondary | ICD-10-CM | POA: Diagnosis not present

## 2020-08-06 LAB — T4, FREE: Free T4: 0.99 ng/dL (ref 0.60–1.60)

## 2020-08-12 ENCOUNTER — Ambulatory Visit (INDEPENDENT_AMBULATORY_CARE_PROVIDER_SITE_OTHER): Payer: Medicare HMO | Admitting: Family Medicine

## 2020-08-12 ENCOUNTER — Other Ambulatory Visit: Payer: Self-pay

## 2020-08-12 ENCOUNTER — Encounter: Payer: Self-pay | Admitting: Family Medicine

## 2020-08-12 VITALS — BP 116/80 | HR 98 | Temp 98.0°F | Ht <= 58 in | Wt 180.4 lb

## 2020-08-12 DIAGNOSIS — N1831 Chronic kidney disease, stage 3a: Secondary | ICD-10-CM

## 2020-08-12 DIAGNOSIS — Z8673 Personal history of transient ischemic attack (TIA), and cerebral infarction without residual deficits: Secondary | ICD-10-CM

## 2020-08-12 DIAGNOSIS — I69351 Hemiplegia and hemiparesis following cerebral infarction affecting right dominant side: Secondary | ICD-10-CM

## 2020-08-12 DIAGNOSIS — G629 Polyneuropathy, unspecified: Secondary | ICD-10-CM

## 2020-08-12 DIAGNOSIS — I5032 Chronic diastolic (congestive) heart failure: Secondary | ICD-10-CM

## 2020-08-12 DIAGNOSIS — F331 Major depressive disorder, recurrent, moderate: Secondary | ICD-10-CM

## 2020-08-12 DIAGNOSIS — M8000XA Age-related osteoporosis with current pathological fracture, unspecified site, initial encounter for fracture: Secondary | ICD-10-CM

## 2020-08-12 DIAGNOSIS — J432 Centrilobular emphysema: Secondary | ICD-10-CM

## 2020-08-12 DIAGNOSIS — Z0000 Encounter for general adult medical examination without abnormal findings: Secondary | ICD-10-CM

## 2020-08-12 DIAGNOSIS — E559 Vitamin D deficiency, unspecified: Secondary | ICD-10-CM

## 2020-08-12 DIAGNOSIS — Z0001 Encounter for general adult medical examination with abnormal findings: Secondary | ICD-10-CM

## 2020-08-12 DIAGNOSIS — I739 Peripheral vascular disease, unspecified: Secondary | ICD-10-CM

## 2020-08-12 DIAGNOSIS — R296 Repeated falls: Secondary | ICD-10-CM

## 2020-08-12 DIAGNOSIS — R413 Other amnesia: Secondary | ICD-10-CM

## 2020-08-12 DIAGNOSIS — G4700 Insomnia, unspecified: Secondary | ICD-10-CM

## 2020-08-12 DIAGNOSIS — Z23 Encounter for immunization: Secondary | ICD-10-CM | POA: Diagnosis not present

## 2020-08-12 DIAGNOSIS — I10 Essential (primary) hypertension: Secondary | ICD-10-CM

## 2020-08-12 DIAGNOSIS — I6523 Occlusion and stenosis of bilateral carotid arteries: Secondary | ICD-10-CM

## 2020-08-12 DIAGNOSIS — J9611 Chronic respiratory failure with hypoxia: Secondary | ICD-10-CM

## 2020-08-12 DIAGNOSIS — E039 Hypothyroidism, unspecified: Secondary | ICD-10-CM

## 2020-08-12 DIAGNOSIS — Z87440 Personal history of urinary (tract) infections: Secondary | ICD-10-CM

## 2020-08-12 DIAGNOSIS — I720 Aneurysm of carotid artery: Secondary | ICD-10-CM

## 2020-08-12 DIAGNOSIS — E782 Mixed hyperlipidemia: Secondary | ICD-10-CM

## 2020-08-12 MED ORDER — TRAMADOL HCL 50 MG PO TABS
50.0000 mg | ORAL_TABLET | Freq: Two times a day (BID) | ORAL | 0 refills | Status: DC | PRN
Start: 2020-08-12 — End: 2020-12-16

## 2020-08-12 MED ORDER — SERTRALINE HCL 100 MG PO TABS
100.0000 mg | ORAL_TABLET | Freq: Every day | ORAL | 3 refills | Status: DC
Start: 2020-08-12 — End: 2020-12-16

## 2020-08-12 MED ORDER — LEVOTHYROXINE SODIUM 50 MCG PO TABS
50.0000 ug | ORAL_TABLET | Freq: Every day | ORAL | 3 refills | Status: DC
Start: 2020-08-12 — End: 2021-06-21

## 2020-08-12 MED ORDER — ATORVASTATIN CALCIUM 40 MG PO TABS
40.0000 mg | ORAL_TABLET | Freq: Every day | ORAL | 3 refills | Status: DC
Start: 2020-08-12 — End: 2020-09-09

## 2020-08-12 MED ORDER — CLOPIDOGREL BISULFATE 75 MG PO TABS
75.0000 mg | ORAL_TABLET | Freq: Every day | ORAL | 3 refills | Status: DC
Start: 2020-08-12 — End: 2020-09-09

## 2020-08-12 MED ORDER — TRAZODONE HCL 100 MG PO TABS
100.0000 mg | ORAL_TABLET | Freq: Every day | ORAL | 3 refills | Status: DC
Start: 2020-08-12 — End: 2020-09-09

## 2020-08-12 MED ORDER — GABAPENTIN 100 MG PO CAPS
100.0000 mg | ORAL_CAPSULE | Freq: Every day | ORAL | 3 refills | Status: DC
Start: 2020-08-12 — End: 2020-09-09

## 2020-08-12 NOTE — Progress Notes (Signed)
Patient ID: [name] [name], female    DOB: 1946-01-27, 74 y.o.   MRN: [id number]  This visit was conducted in person.  BP 116/80 (BP Location: Left Arm, Patient Position: Sitting, Cuff Size: Normal)   Pulse 98   Temp 98 F (36.7 C) (Temporal)   Ht 4\' 10"  (1.473 m)   Wt 180 lb 7 oz (81.8 kg)   SpO2 93%   BMI 37.71 kg/m    CC: CPE Subjective:   HPI: [name] [name] is a 74 y.o. female presenting on 08/12/2020 for Medicare Wellness (Pt accompanied by daughter, [name]- temp 98.0.)   Did not see health advisor this year.    Hearing Screening   125Hz  250Hz  500Hz  1000Hz  2000Hz  3000Hz  4000Hz  6000Hz  8000Hz   Right ear:   40 40 40  0    Left ear:   40 0 20  0    Vision Screening Comments: Last eye exam, 06/2020.    Office Visit from 08/12/2020 in [facility] at [facility] Total Score 0    Pt denies hearing difficulties.  Fall Risk  08/12/2020 07/08/2019 06/27/2018 12/05/2017 06/26/2017  Falls in the past year? 1 0 Yes Yes No  Number falls in past yr: 1 0 2 or more 2 or more -  Injury with Fall? 1 0 Yes Yes -  Comment Back injury - - - -  Risk Factor Category  - - - High Fall Risk -  Risk for fall due to : - - - History of fall(s);Impaired mobility -  Risk for fall due to: Comment - - - She broke her back when she fell out of bed -  Follow up - - - Falls evaluation completed;Education provided;Falls prevention discussed -  No falls since 11/2019 with T10 fracture. Using walker at home, doesn't use ambulatory assistive device otherwise. Several near fals.   Hospitalized 01/2020 after gen weakness with dyspnea thought due to overmedication after kyphoplasty for T10 compression fracture.   Continues nocturnal supplemental oxygen as well as intermittent during the day as needed with exertion.   Recent MRI head MRA head (Deveshwar) - no changes.  Sees cardiology Q6 mo.   She has started following Optavia diet (meal replacement) about 3 wks ago. Finds this helps  control pedal edema. Drinking unsweetened almond milk.   Preventative: Colonoscopy9/2017 for abnormal cologuard-decreased sphincter tone, diverticulosis, no f/u recommended (Danis). Pap smear normal 11/2012 - strong fmhx cervical cancer. Decided to age out. Denies pelvic pain or vaginal bleeding.  Mammogram Birads1 07/2020 ([facility]) Lung cancer screen -33PY hx. Last CTA lung 01/2020 DEXA Date: 11/2010 T -2.7 spine, -1.9 hip Osteoporosis.RptDEXA 06/2017 - T score -2.2 spine, -2.4 hip (osteopenia). Compliant with vit D and fosamax weekly (started 2013). Continues fosamax 70mg  weekly. Prolia unaffordable. Poor calcium in diet so she takes extra calcium supplement.  Flu shot yearly COVID vaccine Pfizer 10/2019, 11/2019, considering booster Pneumovax - 11/2012. Prevnar 12/2013  Tdap 2016 Shingrix - discussed Advanced directives discussed -Advanced directive was reviewed and scanned (07/2018). [name] Theola Sequin then Jonette Pesa are HCPOA. Does not want life prolonged if deemed terminal condition. Does want tube feeding however. Wants living will followed above HCPOA. Discussed code status - she would want trial at CPR, pressors, intubation, aware of risks associated with these conditions including but not limited to rib fractures, difficulty weaning off ventilator.  Seat belt use discussed Sunscreen use discussed. No suspicious moles. Ex smoker quit 2014.  Alcohol - none  Dentist -  has dentures  Eye exam Q3 mo - monitoring glaucoma  Bowel - no constipation Bladder - ongoing urge incontinence, uses depends regularly, sees uro [employer])  Lives with granddaughter and great grandchildren Occupation: retired, was Lawyer Edu: 8th grade Activity: no regular activity, enjoys computer games Diet: some water, fruits/vegetables daily     Relevant past medical, surgical, family and social history reviewed and updated as indicated. Interim medical history since our last visit reviewed. Allergies and  medications reviewed and updated. Outpatient Medications Prior to Visit  Medication Sig Dispense Refill  . acetaminophen (TYLENOL) 500 MG tablet Take 1 tablet (500 mg total) by mouth every 6 (six) hours as needed for headache. (Patient taking differently: Take 500-1,000 mg by mouth every 6 (six) hours as needed for headache. )    . albuterol (VENTOLIN HFA) 108 (90 Base) MCG/ACT inhaler INHALE 2 PUFFS INTO THE LUNGS EVERY 6 (SIX) HOURS AS NEEDED FOR WHEEZING OR SHORTNESS OF BREATH. 18 g 0  . alendronate (FOSAMAX) 70 MG tablet TAKE 1 TABLET WEEKLY. TAKE WITH A FULL GLASS OF WATER ON AN EMPTY STOMACH. 12 tablet 0  . ascorbic acid (VITAMIN C) 250 MG CHEW Chew 500 mg by mouth daily.    [name] aspirin (ASPIRIN 81) 81 MG EC tablet Take 1 tablet (81 mg total) by mouth daily. Swallow whole.    . calcium carbonate (TUMS - DOSED IN MG ELEMENTAL CALCIUM) 500 MG chewable tablet Chew 1 tablet by mouth daily as needed for indigestion or heartburn.    . cetirizine (ZYRTEC) 10 MG tablet Take 10 mg by mouth daily.    . Cholecalciferol (VITAMIN D3) 125 MCG (5000 UT) CAPS Take 5,000 Units by mouth daily. Gummies    . Cranberry-Vitamin C (AZO CRANBERRY URINARY TRACT PO) Take 1 tablet by mouth in the morning and at bedtime.     . furosemide (LASIX) 40 MG tablet Take 20 mg by mouth daily as needed for fluid. Weight every morning before given    . Melatonin 5 MG CHEW Chew 10 mg by mouth at bedtime. Gummies     . Multiple Vitamins-Minerals (MULTIVITAMIN PO) Take 1 tablet by mouth daily. Gummie    . nitrofurantoin (MACRODANTIN) 50 MG capsule Take 1 capsule (50 mg total) by mouth daily. 30 capsule 4  . OXYGEN Inhale 2 L/min into the lungs See admin instructions. Inhale 2 L/min of oxygen at bedtime and during the day as needed for shortness of breath    . pantoprazole (PROTONIX) 40 MG tablet Take 1 tablet (40 mg total) by mouth daily. (Patient taking differently: Take 40 mg by mouth daily as needed (acid reflux). )    . Potassium  Chloride ER 20 MEQ TBCR TAKE 2 TABLETS BY MOUTH AS NEEDED (WHENEVER  YOU  TAKE  LASIX) (Patient taking differently: Take 20 mEq by mouth daily as needed (when taking lasix). ) 180 tablet 2  . zinc gluconate 50 MG tablet Take 50 mg by mouth daily.    [name] atorvastatin (LIPITOR) 40 MG tablet TAKE 1 TABLET EVERY DAY (Patient taking differently: Take 40 mg by mouth daily. ) 90 tablet 0  . clopidogrel (PLAVIX) 75 MG tablet TAKE 1 TABLET (75 MG TOTAL) BY MOUTH DAILY. 90 tablet 1  . gabapentin (NEURONTIN) 100 MG capsule TAKE 1 CAPSULE (100 MG TOTAL) BY MOUTH AT BEDTIME. 90 capsule 1  . levothyroxine (SYNTHROID) 50 MCG tablet TAKE 1 TABLET  DAILY BEFORE BREAKFAST. (Patient taking differently: Take 50 mcg by mouth daily before breakfast. )  90 tablet 0  . sertraline (ZOLOFT) 100 MG tablet TAKE 1 TABLET EVERY DAY (Patient taking differently: Take 100 mg by mouth daily. ) 90 tablet 1  . traMADol (ULTRAM) 50 MG tablet Take 1-2 tablets (50-100 mg total) by mouth 2 (two) times daily as needed for moderate pain. (Patient taking differently: Take 50 mg by mouth 2 (two) times daily as needed for moderate pain. ) 20 tablet 0  . traZODone (DESYREL) 100 MG tablet Take 1 tablet (100 mg total) by mouth at bedtime.    [name] ezetimibe (ZETIA) 10 MG tablet Take 1 tablet (10 mg total) by mouth daily. 90 tablet 3   No facility-administered medications prior to visit.     Per HPI unless specifically indicated in ROS section below Review of Systems  Constitutional: Negative for activity change, appetite change, chills, fatigue, fever and unexpected weight change.  HENT: Negative for hearing loss.   Eyes: Negative for visual disturbance.  Respiratory: Negative for cough, chest tightness, shortness of breath and wheezing.   Cardiovascular: Negative for chest pain, palpitations and leg swelling.  Gastrointestinal: Negative for abdominal distention, abdominal pain, blood in stool, constipation, diarrhea, nausea and vomiting.    Genitourinary: Negative for difficulty urinating and hematuria.  Musculoskeletal: Negative for arthralgias, myalgias and neck pain.  Skin: Negative for rash.  Neurological: Negative for dizziness, seizures, syncope and headaches.  Hematological: Negative for adenopathy. Bruises/bleeds easily.  Psychiatric/Behavioral: Negative for dysphoric mood. The patient is not nervous/anxious.    Objective:  BP 116/80 (BP Location: Left Arm, Patient Position: Sitting, Cuff Size: Normal)   Pulse 98   Temp 98 F (36.7 C) (Temporal)   Ht 4\' 10"  (1.473 m)   Wt 180 lb 7 oz (81.8 kg)   SpO2 93%   BMI 37.71 kg/m   Wt Readings from Last 3 Encounters:  08/12/20 180 lb 7 oz (81.8 kg)  06/26/20 180 lb (81.6 kg)  04/30/20 171 lb (77.6 kg)      Physical Exam Vitals and nursing note reviewed.  Constitutional:      General: She is not in acute distress.    Appearance: Normal appearance. She is well-developed. She is not ill-appearing.  HENT:     Head: Normocephalic and atraumatic.     Right Ear: Hearing, tympanic membrane, ear canal and external ear normal.     Left Ear: Hearing, tympanic membrane, ear canal and external ear normal.  Eyes:     General: No scleral icterus.    Extraocular Movements: Extraocular movements intact.     Conjunctiva/sclera: Conjunctivae normal.     Pupils: Pupils are equal, round, and reactive to light.  Neck:     Thyroid: No thyroid mass or thyromegaly.     Vascular: No carotid bruit.  Cardiovascular:     Rate and Rhythm: Normal rate and regular rhythm.     Pulses: Normal pulses.          Radial pulses are 2+ on the right side and 2+ on the left side.     Heart sounds: Normal heart sounds. No murmur heard.   Pulmonary:     Effort: Pulmonary effort is normal. No respiratory distress.     Breath sounds: Normal breath sounds. No wheezing, rhonchi or rales.  Abdominal:     General: Abdomen is flat. Bowel sounds are normal. There is no distension.     Palpations:  Abdomen is soft. There is no mass.     Tenderness: There is no abdominal tenderness. There is  no guarding or rebound.     Hernia: No hernia is present.  Musculoskeletal:        General: Normal range of motion.     Cervical back: Normal range of motion and neck supple.     Right lower leg: No edema.     Left lower leg: No edema.  Lymphadenopathy:     Cervical: No cervical adenopathy.  Skin:    General: Skin is warm and dry.     Findings: No rash.  Neurological:     General: No focal deficit present.     Mental Status: She is alert and oriented to person, place, and time.     Comments:  CN grossly intact, station and gait intact Recall 3/3  Calculation - unable to complete - however she does manage her finances   Psychiatric:        Mood and Affect: Mood normal.        Behavior: Behavior normal.        Thought Content: Thought content normal.        Judgment: Judgment normal.       Results for orders placed or performed in visit on 08/06/20  T4, free  Result Value Ref Range   Free T4 0.99 0.60 - 1.60 ng/dL   Lab Results  Component Value Date   CHOL 147 08/05/2020   HDL 67.80 08/05/2020   LDLCALC 63 08/05/2020   LDLDIRECT 84.0 09/11/2017   TRIG 80.0 08/05/2020   CHOLHDL 2 08/05/2020    Lab Results  Component Value Date   CREATININE 1.10 08/05/2020   BUN 26 (H) 08/05/2020   NA 138 08/05/2020   K 4.8 08/05/2020   CL 100 08/05/2020   CO2 31 08/05/2020   Lab Results  Component Value Date   WBC 7.5 08/05/2020   HGB 12.9 08/05/2020   HCT 38.7 08/05/2020   MCV 89.7 08/05/2020   PLT 177.0 08/05/2020    Lab Results  Component Value Date   HGBA1C 5.1 07/31/2018    Assessment & Plan:  This visit occurred during the SARS-CoV-2 public health emergency.  Safety protocols were in place, including screening questions prior to the visit, additional usage of staff PPE, and extensive cleaning of exam room while observing appropriate contact time as indicated for disinfecting  solutions.   Problem List Items Addressed This Visit    Vitamin D deficiency    Continue daily 5000 IU vit D replacement.       Recurrent falls    Presents without use of ambulatory assistive device. She does use walker at home and they endorse recent near-falls. Encouraged regular use of walker when outside of home.       Peripheral vascular disease (HCC)   Relevant Medications   atorvastatin (LIPITOR) 40 MG tablet   Osteoporosis    Latest DEXA showed improvement however did have recent T10 compression fracture on MRI earlier this year. Will continue bisphosphonate given prolia was unaffordable. Continue calcium supplement given poor calcium dietary intake, caution with PVD history.       Neuropathy    Manages with nightly gabapentin. Consider updated folate.      Memory loss    Noted difficulty with calculation on testing today.       Medicare annual wellness visit, subsequent - Primary    I have personally reviewed the Medicare Annual Wellness questionnaire and have noted 1. The patient's medical and social history 2. Their use of alcohol, tobacco or illicit drugs 3.  Their current medications and supplements 4. The patient's functional ability including ADL's, fall risks, home safety risks and hearing or visual impairment. Cognitive function has been assessed and addressed as indicated.  5. Diet and physical activity 6. Evidence for depression or mood disorders The patients weight, height, BMI have been recorded in the chart. I have made referrals, counseling and provided education to the patient based on review of the above and I have provided the pt with a written personalized care plan for preventive services. Provider list updated.. See scanned questionairre as needed for further documentation. Reviewed preventative protocols and updated unless pt declined.       MDD (major depressive disorder), recurrent episode, moderate (HCC)    Chronic, stable period on sertraline  100mg  daily with trazodone nightly       Relevant Medications   sertraline (ZOLOFT) 100 MG tablet   traZODone (DESYREL) 100 MG tablet   Insomnia    Continue nightly trazodone 100mg .       Hypothyroidism (acquired)    Chronic, TSH elevated however fT4 remains normal. Continue current regimen of levothyroxine daily.      Relevant Medications   levothyroxine (SYNTHROID) 50 MCG tablet   Hypertension    Chronic, stable off medication at this time. Only takes lasix PRN - none recently.       Relevant Medications   atorvastatin (LIPITOR) 40 MG tablet   Hyperlipidemia    Chronic, stable on current regimen - continue atorvastatin and zetia. LDL now at goal <70 The 10-year ASCVD risk score Denman George DC Montez Hageman., et al., 2013) is: 13.4%   Values used to calculate the score:     Age: 16 years     Sex: Female     Is Non-Hispanic African American: No     Diabetic: No     Tobacco smoker: No     Systolic Blood Pressure: 116 mmHg     Is BP treated: Yes     HDL Cholesterol: 67.8 mg/dL     Total Cholesterol: 147 mg/dL       Relevant Medications   atorvastatin (LIPITOR) 40 MG tablet   History of recurrent UTIs (Chronic)    Continues daily macrobid preventatively      History of lacunar cerebrovascular accident (CVA)    Followed by Dr Loni Beckwith IR. Continue aspirin, plavix, statin zetia.       Hemiparesis affecting right side as late effect of stroke [facility])   Encounter for general adult medical examination with abnormal findings    Preventative protocols reviewed and updated unless pt declined. Discussed healthy diet and lifestyle.       COPD (chronic obstructive pulmonary disease) (HCC)    Chronic, stable on PRN albuterol inhaler. Continue supplemental O2 use as needed and nocturnally       CKD (chronic kidney disease) stage 3, GFR 30-59 ml/min (HCC)    GFR deteriorated. Reviewed with patient. Encouraged moderately increasing water intake. Stays off ACEI.       Chronic  respiratory failure with hypoxia (HCC)   Chronic diastolic heart failure (HCC)    Managed with rare lasix use.  She feels optavia diet has helped manage edema.       Relevant Medications   atorvastatin (LIPITOR) 40 MG tablet   Carotid stenosis    Continue IR management.       Relevant Medications   atorvastatin (LIPITOR) 40 MG tablet   Carotid artery aneurysm (HCC)   Relevant Medications   atorvastatin (LIPITOR) 40 MG  tablet    Other Visit Diagnoses    Need for influenza vaccination       Relevant Orders   Flu Vaccine QUAD High Dose(Fluad) (Completed)       Meds ordered this encounter  Medications  . traMADol (ULTRAM) 50 MG tablet    Sig: Take 1 tablet (50 mg total) by mouth 2 (two) times daily as needed for moderate pain.    Dispense:  20 tablet    Refill:  0  . levothyroxine (SYNTHROID) 50 MCG tablet    Sig: Take 1 tablet (50 mcg total) by mouth daily before breakfast.    Dispense:  90 tablet    Refill:  3  . gabapentin (NEURONTIN) 100 MG capsule    Sig: Take 1 capsule (100 mg total) by mouth at bedtime.    Dispense:  90 capsule    Refill:  3  . atorvastatin (LIPITOR) 40 MG tablet    Sig: Take 1 tablet (40 mg total) by mouth daily.    Dispense:  90 tablet    Refill:  3  . clopidogrel (PLAVIX) 75 MG tablet    Sig: Take 1 tablet (75 mg total) by mouth daily.    Dispense:  90 tablet    Refill:  3  . sertraline (ZOLOFT) 100 MG tablet    Sig: Take 1 tablet (100 mg total) by mouth daily.    Dispense:  90 tablet    Refill:  3  . traZODone (DESYREL) 100 MG tablet    Sig: Take 1 tablet (100 mg total) by mouth at bedtime.    Dispense:  90 tablet    Refill:  3   Orders Placed This Encounter  Procedures  . Flu Vaccine QUAD High Dose(Fluad)    Patient instructions: Flu shot today  I encourage walker or cane use regularly when outside of the house.  If interested, check with pharmacy about new 2 shot shingles series (shingrix).  Consider COVID booster as well.    You are doing well today Return as needed or in 6 months for follow up visit.   Follow up plan: Return in about 6 months (around 02/09/2021) for follow up visit.  [name], MD

## 2020-08-12 NOTE — Patient Instructions (Addendum)
Flu shot today  I encourage walker or cane use regularly when outside of the house.  If interested, check with pharmacy about new 2 shot shingles series (shingrix).  Consider COVID booster as well.  You are doing well today Return as needed or in 6 months for follow up visit.  Increase water intake to help keep kidneys hydrated  Health Maintenance After Age 74 After age 22, you are at a higher risk for certain long-term diseases and infections as well as injuries from falls. Falls are a major cause of broken bones and head injuries in people who are older than age 63. Getting regular preventive care can help to keep you healthy and well. Preventive care includes getting regular testing and making lifestyle changes as recommended by your health care provider. Talk with your health care provider about:  Which screenings and tests you should have. A screening is a test that checks for a disease when you have no symptoms.  A diet and exercise plan that is right for you. What should I know about screenings and tests to prevent falls? Screening and testing are the best ways to find a health problem early. Early diagnosis and treatment give you the best chance of managing medical conditions that are common after age 61. Certain conditions and lifestyle choices may make you more likely to have a fall. Your health care provider may recommend:  Regular vision checks. Poor vision and conditions such as cataracts can make you more likely to have a fall. If you wear glasses, make sure to get your prescription updated if your vision changes.  Medicine review. Work with your health care provider to regularly review all of the medicines you are taking, including over-the-counter medicines. Ask your health care provider about any side effects that may make you more likely to have a fall. Tell your health care provider if any medicines that you take make you feel dizzy or sleepy.  Osteoporosis screening.  Osteoporosis is a condition that causes the bones to get weaker. This can make the bones weak and cause them to break more easily.  Blood pressure screening. Blood pressure changes and medicines to control blood pressure can make you feel dizzy.  Strength and balance checks. Your health care provider may recommend certain tests to check your strength and balance while standing, walking, or changing positions.  Foot health exam. Foot pain and numbness, as well as not wearing proper footwear, can make you more likely to have a fall.  Depression screening. You may be more likely to have a fall if you have a fear of falling, feel emotionally low, or feel unable to do activities that you used to do.  Alcohol use screening. Using too much alcohol can affect your balance and may make you more likely to have a fall. What actions can I take to lower my risk of falls? General instructions  Talk with your health care provider about your risks for falling. Tell your health care provider if: ? You fall. Be sure to tell your health care provider about all falls, even ones that seem minor. ? You feel dizzy, sleepy, or off-balance.  Take over-the-counter and prescription medicines only as told by your health care provider. These include any supplements.  Eat a healthy diet and maintain a healthy weight. A healthy diet includes low-fat dairy products, low-fat (lean) meats, and fiber from whole grains, beans, and lots of fruits and vegetables. Home safety  Remove any tripping hazards, such as rugs,  cords, and clutter.  Install safety equipment such as grab bars in bathrooms and safety rails on stairs.  Keep rooms and walkways well-lit. Activity   Follow a regular exercise program to stay fit. This will help you maintain your balance. Ask your health care provider what types of exercise are appropriate for you.  If you need a cane or walker, use it as recommended by your health care provider.  Wear  supportive shoes that have nonskid soles. Lifestyle  Do not drink alcohol if your health care provider tells you not to drink.  If you drink alcohol, limit how much you have: ? 0-1 drink a day for women. ? 0-2 drinks a day for men.  Be aware of how much alcohol is in your drink. In the [place] one drink equals one typical bottle of beer (12 oz), one-half glass of wine (5 oz), or one shot of hard liquor (1 oz).  Do not use any products that contain nicotine or tobacco, such as cigarettes and e-cigarettes. If you need help quitting, ask your health care provider. Summary  Having a healthy lifestyle and getting preventive care can help to protect your health and wellness after age 20.  Screening and testing are the best way to find a health problem early and help you avoid having a fall. Early diagnosis and treatment give you the best chance for managing medical conditions that are more common for people who are older than age 62.  Falls are a major cause of broken bones and head injuries in people who are older than age 75. Take precautions to prevent a fall at home.  Work with your health care provider to learn what changes you can make to improve your health and wellness and to prevent falls. This information is not intended to replace advice given to you by your health care provider. Make sure you discuss any questions you have with your health care provider. Document Revised: 01/03/2019 Document Reviewed: 07/26/2017 Elsevier Patient Education  2020 [employer].

## 2020-08-13 NOTE — Assessment & Plan Note (Signed)
Continues daily macrobid preventatively

## 2020-08-13 NOTE — Assessment & Plan Note (Signed)
Presents without use of ambulatory assistive device. She does use walker at home and they endorse recent near-falls. Encouraged regular use of walker when outside of home.

## 2020-08-13 NOTE — Assessment & Plan Note (Signed)
Chronic, stable off medication at this time. Only takes lasix PRN - none recently.

## 2020-08-13 NOTE — Assessment & Plan Note (Signed)
Chronic, stable period on sertraline 100mg  daily with trazodone nightly

## 2020-08-13 NOTE — Assessment & Plan Note (Addendum)
Continue daily 5000 IU vit D replacement.

## 2020-08-13 NOTE — Assessment & Plan Note (Signed)
Noted difficulty with calculation on testing today.

## 2020-08-13 NOTE — Assessment & Plan Note (Signed)
Continue nightly trazodone 100mg .

## 2020-08-13 NOTE — Assessment & Plan Note (Signed)
Preventative protocols reviewed and updated unless pt declined. Discussed healthy diet and lifestyle.  

## 2020-08-13 NOTE — Assessment & Plan Note (Signed)
Chronic, TSH elevated however fT4 remains normal. Continue current regimen of levothyroxine daily.

## 2020-08-13 NOTE — Assessment & Plan Note (Signed)
Continue IR management.

## 2020-08-13 NOTE — Assessment & Plan Note (Signed)
Managed with rare lasix use.  She feels [employer] diet has helped manage edema.

## 2020-08-13 NOTE — Assessment & Plan Note (Addendum)
Chronic, stable on current regimen - continue atorvastatin and zetia. LDL now at goal <70 The 10-year ASCVD risk score Denman George DC Montez Hageman., et al., 2013) is: 13.4%   Values used to calculate the score:     Age: 74 years     Sex: Female     Is Non-Hispanic African American: No     Diabetic: No     Tobacco smoker: No     Systolic Blood Pressure: 116 mmHg     Is BP treated: Yes     HDL Cholesterol: 67.8 mg/dL     Total Cholesterol: 147 mg/dL

## 2020-08-13 NOTE — Assessment & Plan Note (Signed)

## 2020-08-13 NOTE — Assessment & Plan Note (Signed)
GFR deteriorated. Reviewed with patient. Encouraged moderately increasing water intake. Stays off ACEI.

## 2020-08-13 NOTE — Assessment & Plan Note (Addendum)
Followed by Dr Loni Beckwith IR. Continue aspirin, plavix, statin zetia.

## 2020-08-13 NOTE — Assessment & Plan Note (Addendum)
Chronic, stable on PRN albuterol inhaler. Continue supplemental O2 use as needed and nocturnally

## 2020-08-13 NOTE — Assessment & Plan Note (Addendum)
Latest DEXA showed improvement however did have recent T10 compression fracture on MRI earlier this year. Will continue bisphosphonate given prolia was unaffordable. Continue calcium supplement given poor calcium dietary intake, caution with PVD history.

## 2020-08-13 NOTE — Assessment & Plan Note (Signed)
Manages with nightly gabapentin. Consider updated folate.

## 2020-08-14 ENCOUNTER — Telehealth: Payer: Medicare HMO

## 2020-08-14 ENCOUNTER — Telehealth: Payer: Self-pay

## 2020-08-14 NOTE — Chronic Care Management (AMB) (Signed)
Chronic Care Management Pharmacy Assistant   Name: [name] [name]  MRN: [id number] DOB: 24-Jan-1946  Reason for Encounter: Disease State/ Hypertension and Hyperlipidemia   PCP : [name], MD  Patient Questions:  1.  Have you seen any other providers since your last visit? Yes, 08/12/2020- Dr Sharen Hones (PCP)  2.  Any changes in your medicines or health? Yes, 08/12/2020- Tramadol changed from 100 mg to 50 mg- two times daily PRN.    Reviewed chart prior to disease state call. Spoke with patient regarding BP  Recent Office Vitals: BP Readings from Last 3 Encounters:  08/12/20 116/80  06/26/20 (!) 138/36  04/30/20 112/72   Pulse Readings from Last 3 Encounters:  08/12/20 98  06/26/20 74  04/30/20 77    Wt Readings from Last 3 Encounters:  08/12/20 180 lb 7 oz (81.8 kg)  06/26/20 180 lb (81.6 kg)  04/30/20 171 lb (77.6 kg)     Kidney Function Lab Results  Component Value Date/Time   CREATININE 1.10 08/05/2020 11:08 AM   CREATININE 0.92 06/26/2020 07:16 AM   GFR 49.72 (L) 08/05/2020 11:08 AM   GFRNONAA >60 06/26/2020 07:16 AM   GFRAA >60 06/26/2020 07:16 AM    BMP Latest Ref Rng & Units 08/05/2020 06/26/2020 02/20/2020  Glucose 70 - 99 mg/dL 91 329(J) 98  BUN 6 - 23 mg/dL 24(Q) 21 68(T)  Creatinine 0.40 - 1.20 mg/dL 4.19 6.22 2.97  Sodium 135 - 145 mEq/L 138 140 138  Potassium 3.5 - 5.1 mEq/L 4.8 4.6 4.3  Chloride 96 - 112 mEq/L 100 103 107  CO2 19 - 32 mEq/L 31 30 26   Calcium 8.4 - 10.5 mg/dL 9.5 8.9 )    . Current antihypertensive regimen:  o No pharmacotherapy . How often are you checking your Blood Pressure? 1-2x per week . Current home BP readings: 131/80 last reading . What recent interventions/DTPs have been made by any provider to improve Blood Pressure control since last CPP Visit: Patient continues to monitor blood pressure once weekly and documents.  . Any recent hospitalizations or ED visits since last visit with CPP? No . What diet  changes have been made to improve Blood Pressure Control?  o Yes, patient is doing a low cholesterol, low sodium and low calorie diet plan through [employer].  . What exercise is being done to improve your Blood Pressure Control?  o Patient is doing daily exercises, she has a machine that moves her legs and feet, she does squats as much as she can and walks as much as she can.   Adherence Review: Is the patient currently on ACE/ARB medication? No Does the patient have >5 day gap between last estimated fill dates? No- Patient currently not on any antihypertensive regimen.    Allergies:   Allergies  Allergen Reactions  . Doxycycline Nausea Only and Other (See Comments)    Sig GI upset  . Oxycodone Other (See Comments)    Overly sedating, hypotension, decreased oxgyen  . Fluticasone-Salmeterol Anxiety    Medications: Outpatient Encounter Medications as of 08/14/2020  Medication Sig  . acetaminophen (TYLENOL) 500 MG tablet Take 1 tablet (500 mg total) by mouth every 6 (six) hours as needed for headache. (Patient taking differently: Take 500-1,000 mg by mouth every 6 (six) hours as needed for headache. )  . albuterol (VENTOLIN HFA) 108 (90 Base) MCG/ACT inhaler INHALE 2 PUFFS INTO THE LUNGS EVERY 6 (SIX) HOURS AS NEEDED FOR WHEEZING OR SHORTNESS OF BREATH.  08/16/2020  alendronate (FOSAMAX) 70 MG tablet TAKE 1 TABLET WEEKLY. TAKE WITH A FULL GLASS OF WATER ON AN EMPTY STOMACH.  [name] ascorbic acid (VITAMIN C) 250 MG CHEW Chew 500 mg by mouth daily.  [name] aspirin (ASPIRIN 81) 81 MG EC tablet Take 1 tablet (81 mg total) by mouth daily. Swallow whole.  [name] atorvastatin (LIPITOR) 40 MG tablet Take 1 tablet (40 mg total) by mouth daily.  . calcium carbonate (TUMS - DOSED IN MG ELEMENTAL CALCIUM) 500 MG chewable tablet Chew 1 tablet by mouth daily as needed for indigestion or heartburn.  . cetirizine (ZYRTEC) 10 MG tablet Take 10 mg by mouth daily.  . Cholecalciferol (VITAMIN D3) 125 MCG (5000 UT) CAPS Take 5,000  Units by mouth daily. Gummies  . clopidogrel (PLAVIX) 75 MG tablet Take 1 tablet (75 mg total) by mouth daily.  . Cranberry-Vitamin C (AZO CRANBERRY URINARY TRACT PO) Take 1 tablet by mouth in the morning and at bedtime.   [name] ezetimibe (ZETIA) 10 MG tablet Take 1 tablet (10 mg total) by mouth daily.  . furosemide (LASIX) 40 MG tablet Take 20 mg by mouth daily as needed for fluid. Weight every morning before given  . gabapentin (NEURONTIN) 100 MG capsule Take 1 capsule (100 mg total) by mouth at bedtime.  [name] levothyroxine (SYNTHROID) 50 MCG tablet Take 1 tablet (50 mcg total) by mouth daily before breakfast.  . Melatonin 5 MG CHEW Chew 10 mg by mouth at bedtime. Gummies   . Multiple Vitamins-Minerals (MULTIVITAMIN PO) Take 1 tablet by mouth daily. Gummie  . nitrofurantoin (MACRODANTIN) 50 MG capsule Take 1 capsule (50 mg total) by mouth daily.  . OXYGEN Inhale 2 L/min into the lungs See admin instructions. Inhale 2 L/min of oxygen at bedtime and during the day as needed for shortness of breath  . pantoprazole (PROTONIX) 40 MG tablet Take 1 tablet (40 mg total) by mouth daily. (Patient taking differently: Take 40 mg by mouth daily as needed (acid reflux). )  . Potassium Chloride ER 20 MEQ TBCR TAKE 2 TABLETS BY MOUTH AS NEEDED (WHENEVER  YOU  TAKE  LASIX) (Patient taking differently: Take 20 mEq by mouth daily as needed (when taking lasix). )  . sertraline (ZOLOFT) 100 MG tablet Take 1 tablet (100 mg total) by mouth daily.  . traMADol (ULTRAM) 50 MG tablet Take 1 tablet (50 mg total) by mouth 2 (two) times daily as needed for moderate pain.  . traZODone (DESYREL) 100 MG tablet Take 1 tablet (100 mg total) by mouth at bedtime.  [name] zinc gluconate 50 MG tablet Take 50 mg by mouth daily.   No facility-administered encounter medications on file as of 08/14/2020.    Current Diagnosis: Patient Active Problem List   Diagnosis Date Noted  . History of recurrent UTIs 02/18/2020  . Closed T10 fracture (HCC)  01/29/2020  . Acute midline back pain 12/21/2019  . Neuropathy 05/06/2019  . CKD (chronic kidney disease) stage 3, GFR 30-59 ml/min (HCC) 01/24/2019  . Tinnitus of both ears 11/24/2018  . Vision loss of left eye 07/31/2018  . Thrombocytopenia (HCC) 07/31/2018  . Tremor 07/03/2018  . Chronic diastolic heart failure (HCC) 01/30/2018  . Memory loss 01/30/2018  . Peripheral edema 01/30/2018  . Intertrigo 01/30/2018  . Chronic back pain 11/21/2017  . Chronic respiratory failure with hypoxia (HCC) 11/14/2017  . Shortness of breath 11/14/2017  . Closed compression fracture of fourth lumbar vertebra (HCC) 10/02/2017  . Compression fracture of body of thoracic vertebra (  HCC) 09/30/2017  . Hypothyroidism (acquired) 06/28/2017  . Vitamin D deficiency 06/25/2017  . Advanced care planning/counseling discussion 01/29/2015  . Encounter for general adult medical examination with abnormal findings 01/29/2015  . Carotid stenosis 04/03/2014  . Carotid artery aneurysm (HCC) 04/03/2014  . Recurrent falls 01/02/2014  . Hemiparesis affecting right side as late effect of stroke (HCC) 01/02/2014  . Insomnia 01/10/2013  . Medicare annual wellness visit, subsequent 11/26/2012  . Hypertension   . History of lacunar cerebrovascular accident (CVA)   . Peripheral vascular disease (HCC)   . COPD (chronic obstructive pulmonary disease) (HCC)   . Hyperlipidemia   . Osteoarthritis   . MDD (major depressive disorder), recurrent episode, moderate (HCC)   . Ex-smoker   . Osteoporosis 11/25/2010   Comprehensive medication review performed; Spoke to patient regarding cholesterol  Lipid Panel    Component Value Date/Time   CHOL 147 08/05/2020 1108   TRIG 80.0 08/05/2020 1108   HDL 67.80 08/05/2020 1108   LDLCALC 63 08/05/2020 1108   LDLDIRECT 84.0 09/11/2017 1107    10-year ASCVD risk score: The 10-year ASCVD risk score Denman George DC Montez Hageman., et al., 2013) is: 13.4%   Values used to calculate the score:     Age: 5  years     Sex: Female     Is Non-Hispanic African American: No     Diabetic: No     Tobacco smoker: No     Systolic Blood Pressure: 116 mmHg     Is BP treated: Yes     HDL Cholesterol: 67.8 mg/dL     Total Cholesterol: 147 mg/dL  . Current antihyperlipidemic regimen:  o Atorvastatin 40 mg- 1 tablet daily . Previous antihyperlipidemic medications tried: Zetia- placed on hold . ASCVD risk enhancing conditions: age >75, HTN and CHF . What recent interventions/DTPs have been made by any provider to improve Cholesterol control since last CPP Visit: Patient continues to take Atorvastatin 40 mg daily and watches cholesterol content in food.  . Any recent hospitalizations or ED visits since last visit with CPP? No . What diet changes have been made to improve Cholesterol?  o Patient is doing a low carb, low calorie diet through [employer], patient has meal plan delivered to her home.  . What exercise is being done to improve Cholesterol?   Patient is doing daily exercises, she has a machine that moves her legs and feet, she  does squats as much as she can and walks as much as she can.   Adherence Review: Does the patient have >5 day gap between last estimated fill dates? Yes  Per Insurance Data patient current year adherence is 45%  to Atorvastatin 40 mg for Hyperlipemia.     Goals Addressed            This Visit's Progress   . Pharmacy Care Plan   On track    CARE PLAN ENTRY  Current Barriers:  . Chronic Disease Management support, education, and care coordination needs related to Hypertension, Hyperlipidemia, and Heart Failure   Hypertension . Pharmacist Clinical Goal(s): o Over the next 3 month, patient will work with PharmD and providers to maintain BP goal <140/90 . Current regimen:  o No pharmacotherapy . Interventions: o Recommend continued monitoring of blood pressure once weekly to ensure blood pressure remains within goal  . Patient self care activities - Over the next 3  months, patient will: o Check blood pressure weekly, document, and provide at future appointments  Hyperlipidemia . Pharmacist  Clinical Goal(s): o Over the next 3 months, patient will work with PharmD and providers to achieve LDL goal < 70  . Current regimen:  o Atorvastatin 40 mg - 1 tablet daily . Interventions: o Continue to hold Zetia until patient is feeling better, without body aches/muscle weakness . Patient self care activities - Over the next 3 months, patient will: o Continue current medication as prescribed o Watch cholesterol content in foods  Heart Failure . Pharmacist Clinical Goal(s) o Over the next 3 months, patient will work with PharmD and providers to prevent shortness of breath and fluid overload . Current regimen:   Furosemide 40 mg - 1/2 tablet daily as needed for weight gain  Potassium Chloride ER 20 mEq - 1 tablet daily as needed with furosemide . Interventions: o Continue medications as needed for weight gain/swelling . Patient self care activities - Over the next 6 months, patient will: o Continue to check weight daily and take furosemide and potassium if weight gain of 3 pounds overnight or 5 pounds in 1 week  Please see past updates related to this goal by clicking on the "Past Updates" button in the selected goal        Follow-Up:  Pharmacist Review  Patient is doing well with Hypertension and check blood pressures weekly. Patient states her cholesterol levels went down 100 points since starting the [employer] diet which home delivers her meal plans. She is on a low calorie, low carb and low sodium diet. Patient is feeling better and not having anymore problems with swelling. Patient also has a Control and instrumentation engineerHealth Coach is Mauricio Poegina York, NP. Phil DoppMichelle Adams, CPP notified.  Billee Cashingamala Melvin, CMA Clinical Pharmacist Assistant [phone]

## 2020-08-17 DIAGNOSIS — R269 Unspecified abnormalities of gait and mobility: Secondary | ICD-10-CM | POA: Diagnosis not present

## 2020-08-17 DIAGNOSIS — J449 Chronic obstructive pulmonary disease, unspecified: Secondary | ICD-10-CM | POA: Diagnosis not present

## 2020-08-25 ENCOUNTER — Telehealth: Payer: Self-pay

## 2020-08-25 NOTE — Progress Notes (Signed)
Chronic Care Management Pharmacy Assistant   Name: [name] [name]  MRN: [id number] DOB: 1945-12-16  Reason for Encounter: Medication Review  Patient Questions:  1.  Have you seen any other providers since your last visit? No  2.  Any changes in your medicines or health? No    PCP : [name], MD  Allergies:   Allergies  Allergen Reactions  . Doxycycline Nausea Only and Other (See Comments)    Sig GI upset  . Oxycodone Other (See Comments)    Overly sedating, hypotension, decreased oxgyen  . Fluticasone-Salmeterol Anxiety    Medications: Outpatient Encounter Medications as of 08/25/2020  Medication Sig  . acetaminophen (TYLENOL) 500 MG tablet Take 1 tablet (500 mg total) by mouth every 6 (six) hours as needed for headache. (Patient taking differently: Take 500-1,000 mg by mouth every 6 (six) hours as needed for headache. )  . albuterol (VENTOLIN HFA) 108 (90 Base) MCG/ACT inhaler INHALE 2 PUFFS INTO THE LUNGS EVERY 6 (SIX) HOURS AS NEEDED FOR WHEEZING OR SHORTNESS OF BREATH.  [name] alendronate (FOSAMAX) 70 MG tablet TAKE 1 TABLET WEEKLY. TAKE WITH A FULL GLASS OF WATER ON AN EMPTY STOMACH.  [name] ascorbic acid (VITAMIN C) 250 MG CHEW Chew 500 mg by mouth daily.  [name] aspirin (ASPIRIN 81) 81 MG EC tablet Take 1 tablet (81 mg total) by mouth daily. Swallow whole.  [name] atorvastatin (LIPITOR) 40 MG tablet Take 1 tablet (40 mg total) by mouth daily.  . calcium carbonate (TUMS - DOSED IN MG ELEMENTAL CALCIUM) 500 MG chewable tablet Chew 1 tablet by mouth daily as needed for indigestion or heartburn.  . cetirizine (ZYRTEC) 10 MG tablet Take 10 mg by mouth daily.  . Cholecalciferol (VITAMIN D3) 125 MCG (5000 UT) CAPS Take 5,000 Units by mouth daily. Gummies  . clopidogrel (PLAVIX) 75 MG tablet Take 1 tablet (75 mg total) by mouth daily.  . Cranberry-Vitamin C (AZO CRANBERRY URINARY TRACT PO) Take 1 tablet by mouth in the morning and at bedtime.   [name] ezetimibe (ZETIA) 10 MG tablet Take  1 tablet (10 mg total) by mouth daily.  . furosemide (LASIX) 40 MG tablet Take 20 mg by mouth daily as needed for fluid. Weight every morning before given  . gabapentin (NEURONTIN) 100 MG capsule Take 1 capsule (100 mg total) by mouth at bedtime.  [name] levothyroxine (SYNTHROID) 50 MCG tablet Take 1 tablet (50 mcg total) by mouth daily before breakfast.  . Melatonin 5 MG CHEW Chew 10 mg by mouth at bedtime. Gummies   . Multiple Vitamins-Minerals (MULTIVITAMIN PO) Take 1 tablet by mouth daily. Gummie  . nitrofurantoin (MACRODANTIN) 50 MG capsule Take 1 capsule (50 mg total) by mouth daily.  . OXYGEN Inhale 2 L/min into the lungs See admin instructions. Inhale 2 L/min of oxygen at bedtime and during the day as needed for shortness of breath  . pantoprazole (PROTONIX) 40 MG tablet Take 1 tablet (40 mg total) by mouth daily. (Patient taking differently: Take 40 mg by mouth daily as needed (acid reflux). )  . Potassium Chloride ER 20 MEQ TBCR TAKE 2 TABLETS BY MOUTH AS NEEDED (WHENEVER  YOU  TAKE  LASIX) (Patient taking differently: Take 20 mEq by mouth daily as needed (when taking lasix). )  . sertraline (ZOLOFT) 100 MG tablet Take 1 tablet (100 mg total) by mouth daily.  . traMADol (ULTRAM) 50 MG tablet Take 1 tablet (50 mg total) by mouth 2 (two) times daily as needed  for moderate pain.  . traZODone (DESYREL) 100 MG tablet Take 1 tablet (100 mg total) by mouth at bedtime.  [name] zinc gluconate 50 MG tablet Take 50 mg by mouth daily.   No facility-administered encounter medications on file as of 08/25/2020.    Current Diagnosis: Patient Active Problem List   Diagnosis Date Noted  . History of recurrent UTIs 02/18/2020  . Closed T10 fracture (HCC) 01/29/2020  . Acute midline back pain 12/21/2019  . Neuropathy 05/06/2019  . CKD (chronic kidney disease) stage 3, GFR 30-59 ml/min (HCC) 01/24/2019  . Tinnitus of both ears 11/24/2018  . Vision loss of left eye 07/31/2018  . Thrombocytopenia (HCC)  07/31/2018  . Tremor 07/03/2018  . Chronic diastolic heart failure (HCC) 01/30/2018  . Memory loss 01/30/2018  . Peripheral edema 01/30/2018  . Intertrigo 01/30/2018  . Chronic back pain 11/21/2017  . Chronic respiratory failure with hypoxia (HCC) 11/14/2017  . Shortness of breath 11/14/2017  . Closed compression fracture of fourth lumbar vertebra (HCC) 10/02/2017  . Compression fracture of body of thoracic vertebra (HCC) 09/30/2017  . Hypothyroidism (acquired) 06/28/2017  . Vitamin D deficiency 06/25/2017  . Advanced care planning/counseling discussion 01/29/2015  . Encounter for general adult medical examination with abnormal findings 01/29/2015  . Carotid stenosis 04/03/2014  . Carotid artery aneurysm (HCC) 04/03/2014  . Recurrent falls 01/02/2014  . Hemiparesis affecting right side as late effect of stroke (HCC) 01/02/2014  . Insomnia 01/10/2013  . Medicare annual wellness visit, subsequent 11/26/2012  . Hypertension   . History of lacunar cerebrovascular accident (CVA)   . Peripheral vascular disease (HCC)   . COPD (chronic obstructive pulmonary disease) (HCC)   . Hyperlipidemia   . Osteoarthritis   . MDD (major depressive disorder), recurrent episode, moderate (HCC)   . Ex-smoker   . Osteoporosis 11/25/2010    Goals Addressed   None     Follow-Up:  Pharmacist Review   Reviewed chart and adherence measures. Per insurance data, atorvastatin 100% adherent.

## 2020-09-02 NOTE — Progress Notes (Deleted)
Cardiology Office Note  Date:  09/02/2020   ID:  [name] [name], [name] [name] 21-Sep-1946, MRN [id number]  PCP:  [name], MD   No chief complaint on file.   HPI:  [name] [name] is a 74 year old woman with past medical history of Hemiparesis affecting right side from stroke Carotid stenosis COPD, 40-year smoking history Mild aortic valve stenosis CT scan 2019  : 50% right ICA origin stenosis,  50-55% left common carotid artery stenoses,  Severe right subclavian artery origin stenosis, and severe right and moderate left vertebral artery origin stenoses. Who presents for routine follow-up of her shortness of breath, weight gain, hypoxia,  Prior clinic visit March 2021 It was felt that shortness of breath was likely multifactorial from higher weight, deconditioning, long smoking history, COPD , Aortic valve stenosis  Diuretic regiment was changed as below Lasix 20 mg daily for weigth 178 to 183 Weight >183, lasix 20 twice a day x 1 day until weight drops Weight <177, no lasix   ----Lifestyle modification, weight loss recommended, conditioning She has had a recent stress test on that visit  On home oxygen for her COPD Previously noted to have hypoxia on exertion, into the 80s back into the 90s on resting --- Of note she is not using oxygen during the day but feels he might need it  REDS VEST score today of 21% Weight stable 180 pounds Goal weight is likely around 180 pounds  EKG personally reviewed by myself on todays visit Shows normal sinus rhythm rate 99 bpm no significant ST-T wave changes  Other past medical history reviewed Echo 2019 - Left ventricle: estimated ejection fraction was in the range of 60%  to 65%. Wall motion was normal; there were no regional wall  motion abnormalities. Doppler parameters are consistent with  abnormal left ventricular relaxation (grade 1 diastolic  dysfunction). Indeterminate LV filling pressure.  - Aortic  valve:severe thickening; severely thickened, moderately calcified leaflets. Valve mobility was moderately restricted. There was mild stenosis. mild regurgitation. Mean gradient (S): 9 mm  Hg. Valve area (VTI): 1.5 cm^2.   Stress test: low risk 01/2019   PMH:   has a past medical history of (HFpEF) heart failure with preserved ejection fraction (HCC), Aneurysm (HCC), Anxiety, Anxiety and depression, Aortic stenosis, Carotid stenosis, CHF (congestive heart failure) (HCC), Compression fracture of body of thoracic vertebra (HCC), Concussion (08/03/2015), COPD (chronic obstructive pulmonary disease) (HCC) (12/2012), Depression, Fall (08/03/2015), Fracture of cervical vertebra, C5 (HCC) (08/06/2015), History of chicken pox, Hyperlipidemia, Hypertension, Hypothyroidism, Lower back pain, Neuropathy, On supplemental oxygen by nasal cannula, Osteoarthritis, Osteoporosis (11/2010), Peripheral vascular disease (HCC), Smoker, Stroke (HCC) (2010), and Wears dentures.  PSH:    Past Surgical History:  Procedure Laterality Date  . APPENDECTOMY  1960  . CATARACT EXTRACTION     bilateral  . CESAREAN SECTION    . CHOLECYSTECTOMY  1970  . COLONOSCOPY  2004   diverticulosis, no polyps Jarold Motto)  . COLONOSCOPY  05/2016   decreased sphincter tone, diverticulosis, no f/u recommended (Danis)  . DEXA  11/2010   T -2.7 spine, -1.9 hip  . HIP SURGERY Left 2006   fractured - screws placed  . IR ANGIO INTRA EXTRACRAN SEL COM CAROTID INNOMINATE BILAT MOD SED  08/01/2018  . IR ANGIO INTRA EXTRACRAN SEL COM CAROTID INNOMINATE BILAT MOD SED  12/10/2018  . IR ANGIO INTRA EXTRACRAN SEL COM CAROTID INNOMINATE BILAT MOD SED  06/26/2020  . IR ANGIO VERTEBRAL SEL SUBCLAVIAN INNOMINATE UNI L MOD SED  06/26/2020  . IR ANGIO VERTEBRAL SEL SUBCLAVIAN INNOMINATE UNI R MOD SED  08/01/2018  . IR ANGIO VERTEBRAL SEL SUBCLAVIAN INNOMINATE UNI R MOD SED  12/10/2018  . IR ANGIO VERTEBRAL SEL VERTEBRAL UNI L MOD SED  12/10/2018  . IR RADIOLOGIST  EVAL & MGMT  01/09/2018  . KYPHOPLASTY  10/02/2017   Procedure: LUMBAR FOUR KYPHOPLASTY;  Surgeon: Coletta Memos, MD;  Location: [facility] OR;  Service: Neurosurgery;;  . KYPHOPLASTY N/A 01/29/2020   Procedure: Thoracic Ten Kyphoplasty;  Surgeon: Coletta Memos, MD;  Location: [facility] OR;  Service: Neurosurgery;  Laterality: N/A;  . RADIOLOGY WITH ANESTHESIA N/A 11/11/2015   Procedure: RADIOLOGY WITH ANESTHESIA;  Surgeon: Julieanne Cotton, MD;  Location: [facility] OR;  Service: Radiology;  Laterality: N/A;  . RADIOLOGY WITH ANESTHESIA N/A 12/10/2018   Procedure: STENTING;  Surgeon: Julieanne Cotton, MD;  Location: [facility] OR;  Service: Radiology;  Laterality: N/A;    Current Outpatient Medications  Medication Sig Dispense Refill  . acetaminophen (TYLENOL) 500 MG tablet Take 1 tablet (500 mg total) by mouth every 6 (six) hours as needed for headache. (Patient taking differently: Take 500-1,000 mg by mouth every 6 (six) hours as needed for headache. )    . albuterol (VENTOLIN HFA) 108 (90 Base) MCG/ACT inhaler INHALE 2 PUFFS INTO THE LUNGS EVERY 6 (SIX) HOURS AS NEEDED FOR WHEEZING OR SHORTNESS OF BREATH. 18 g 0  . alendronate (FOSAMAX) 70 MG tablet TAKE 1 TABLET WEEKLY. TAKE WITH A FULL GLASS OF WATER ON AN EMPTY STOMACH. 12 tablet 0  . ascorbic acid (VITAMIN C) 250 MG CHEW Chew 500 mg by mouth daily.    [name] aspirin (ASPIRIN 81) 81 MG EC tablet Take 1 tablet (81 mg total) by mouth daily. Swallow whole.    [name] atorvastatin (LIPITOR) 40 MG tablet Take 1 tablet (40 mg total) by mouth daily. 90 tablet 3  . calcium carbonate (TUMS - DOSED IN MG ELEMENTAL CALCIUM) 500 MG chewable tablet Chew 1 tablet by mouth daily as needed for indigestion or heartburn.    . cetirizine (ZYRTEC) 10 MG tablet Take 10 mg by mouth daily.    . Cholecalciferol (VITAMIN D3) 125 MCG (5000 UT) CAPS Take 5,000 Units by mouth daily. Gummies    . clopidogrel (PLAVIX) 75 MG tablet Take 1 tablet (75 mg total) by mouth daily. 90 tablet 3  . Cranberry-Vitamin C  (AZO CRANBERRY URINARY TRACT PO) Take 1 tablet by mouth in the morning and at bedtime.     [name] ezetimibe (ZETIA) 10 MG tablet Take 1 tablet (10 mg total) by mouth daily. 90 tablet 3  . furosemide (LASIX) 40 MG tablet Take 20 mg by mouth daily as needed for fluid. Weight every morning before given    . gabapentin (NEURONTIN) 100 MG capsule Take 1 capsule (100 mg total) by mouth at bedtime. 90 capsule 3  . levothyroxine (SYNTHROID) 50 MCG tablet Take 1 tablet (50 mcg total) by mouth daily before breakfast. 90 tablet 3  . Melatonin 5 MG CHEW Chew 10 mg by mouth at bedtime. Gummies     . Multiple Vitamins-Minerals (MULTIVITAMIN PO) Take 1 tablet by mouth daily. Gummie    . nitrofurantoin (MACRODANTIN) 50 MG capsule Take 1 capsule (50 mg total) by mouth daily. 30 capsule 4  . OXYGEN Inhale 2 L/min into the lungs See admin instructions. Inhale 2 L/min of oxygen at bedtime and during the day as needed for shortness of breath    . pantoprazole (PROTONIX) 40 MG  tablet Take 1 tablet (40 mg total) by mouth daily. (Patient taking differently: Take 40 mg by mouth daily as needed (acid reflux). )    . Potassium Chloride ER 20 MEQ TBCR TAKE 2 TABLETS BY MOUTH AS NEEDED (WHENEVER  YOU  TAKE  LASIX) (Patient taking differently: Take 20 mEq by mouth daily as needed (when taking lasix). ) 180 tablet 2  . sertraline (ZOLOFT) 100 MG tablet Take 1 tablet (100 mg total) by mouth daily. 90 tablet 3  . traMADol (ULTRAM) 50 MG tablet Take 1 tablet (50 mg total) by mouth 2 (two) times daily as needed for moderate pain. 20 tablet 0  . traZODone (DESYREL) 100 MG tablet Take 1 tablet (100 mg total) by mouth at bedtime. 90 tablet 3  . zinc gluconate 50 MG tablet Take 50 mg by mouth daily.     No current facility-administered medications for this visit.    Allergies:   Doxycycline, Oxycodone, and Fluticasone-salmeterol   Social History:  The patient  reports that she quit smoking about 7 years ago. Her smoking use included  cigarettes. She has a 92.00 pack-year smoking history. She has never used smokeless tobacco. She reports that she does not drink alcohol and does not use drugs.   Family History:   family history includes CAD in her maternal aunt, mother, and sister; Cancer in her maternal grandmother and mother; Cirrhosis in her father; Diabetes in her maternal aunt; Hypertension in her daughter; Sudden death (age of onset: 7) in her father.    Review of Systems: Review of Systems  Constitutional: Negative.   HENT: Negative.   Respiratory: Positive for shortness of breath.   Cardiovascular: Negative.   Gastrointestinal: Negative.   Musculoskeletal: Negative.   Neurological: Negative.   Psychiatric/Behavioral: Negative.   All other systems reviewed and are negative.   PHYSICAL EXAM: VS:  There were no vitals taken for this visit. , BMI There is no height or weight on file to calculate BMI. GEN: Well nourished, well developed, in no acute distress HEENT: normal Neck: no JVD, carotid bruits, or masses Cardiac: RRR; no murmurs, rubs, or gallops,no edema  Respiratory:  clear to auscultation bilaterally, normal work of breathing GI: soft, nontender, nondistended, + BS MS: no deformity or atrophy Skin: warm and dry, no rash Neuro:  Strength and sensation are intact Psych: euthymic mood, full affect   Recent Labs: 11/05/2019: Pro B Natriuretic peptide (BNP) 32.0 02/14/2020: B Natriuretic Peptide 71.2 02/20/2020: Magnesium 2.3 08/05/2020: ALT 20; BUN 26; Creatinine, Ser 1.10; Hemoglobin 12.9; Platelets 177.0; Potassium 4.8; Sodium 138; TSH 5.19    Lipid Panel Lab Results  Component Value Date   CHOL 147 08/05/2020   HDL 67.80 08/05/2020   LDLCALC 63 08/05/2020   TRIG 80.0 08/05/2020     Wt Readings from Last 3 Encounters:  08/12/20 180 lb 7 oz (81.8 kg)  06/26/20 180 lb (81.6 kg)  04/30/20 171 lb (77.6 kg)      ASSESSMENT AND PLAN:  Problem List Items Addressed This Visit    None      Shortness of breath likely multifactorial Weight is higher, deconditioned, Long history of smoking 40 years, underlying COPD Although normal ejection fraction on prior echo, there is diastolic dysfunction, mild aortic valve stenosis 2 years ago --- Continue current diuretic regiment Discussed   Hyperlipidemia On Lipitor LDL above goal, we will add Zetia Goal LDL less than 70 given her carotid stenosis  PAD CT scan 2019 reviewed 50% right ICA  origin stenosis, 50-55% left common carotid artery stenoses, severe right subclavian artery origin stenosis, and severe right and moderate left vertebral artery origin stenoses. -Add Zetia as above Will need repeat ultrasound in follow-up  COPD On home oxygen at night Suspect she will need oxygen at nighttime Recommend she check ambulatory saturations at home  Disposition:   F/U  6 months   Total encounter time more than 25 minutes  Greater than 50% was spent in counseling and coordination of care with the patient    Signed, Dossie Arbour, M.D., Ph.D. [facility] [place], [place] [phone]

## 2020-09-07 ENCOUNTER — Other Ambulatory Visit: Payer: Self-pay | Admitting: Family Medicine

## 2020-09-07 ENCOUNTER — Ambulatory Visit: Payer: Medicare HMO | Admitting: Cardiovascular Disease

## 2020-09-07 DIAGNOSIS — I739 Peripheral vascular disease, unspecified: Secondary | ICD-10-CM

## 2020-09-07 DIAGNOSIS — I350 Nonrheumatic aortic (valve) stenosis: Secondary | ICD-10-CM

## 2020-09-07 DIAGNOSIS — E782 Mixed hyperlipidemia: Secondary | ICD-10-CM

## 2020-09-07 DIAGNOSIS — I6523 Occlusion and stenosis of bilateral carotid arteries: Secondary | ICD-10-CM

## 2020-09-07 DIAGNOSIS — R0602 Shortness of breath: Secondary | ICD-10-CM

## 2020-09-07 DIAGNOSIS — J432 Centrilobular emphysema: Secondary | ICD-10-CM

## 2020-09-07 DIAGNOSIS — I5032 Chronic diastolic (congestive) heart failure: Secondary | ICD-10-CM

## 2020-09-07 DIAGNOSIS — I10 Essential (primary) hypertension: Secondary | ICD-10-CM

## 2020-09-09 ENCOUNTER — Telehealth: Payer: Self-pay

## 2020-09-09 ENCOUNTER — Other Ambulatory Visit: Payer: Self-pay | Admitting: Family Medicine

## 2020-09-09 MED ORDER — CLOPIDOGREL BISULFATE 75 MG PO TABS
75.0000 mg | ORAL_TABLET | Freq: Every day | ORAL | 3 refills | Status: DC
Start: 2020-09-09 — End: 2020-12-16

## 2020-09-09 MED ORDER — TRAZODONE HCL 100 MG PO TABS
100.0000 mg | ORAL_TABLET | Freq: Every day | ORAL | 3 refills | Status: DC
Start: 2020-09-09 — End: 2020-12-16

## 2020-09-09 MED ORDER — ATORVASTATIN CALCIUM 40 MG PO TABS: 40.0000 mg | ORAL_TABLET | Freq: Every day | ORAL | 3 refills | Status: DC

## 2020-09-09 MED ORDER — GABAPENTIN 100 MG PO CAPS: 100.0000 mg | ORAL_CAPSULE | Freq: Every day | ORAL | 3 refills | Status: DC

## 2020-09-09 NOTE — Telephone Encounter (Signed)
Received faxed refill requests from [facility] order.  E-scribed rxs.

## 2020-09-11 ENCOUNTER — Other Ambulatory Visit: Payer: Self-pay

## 2020-09-11 ENCOUNTER — Ambulatory Visit: Payer: Medicare HMO | Admitting: Nurse Practitioner

## 2020-09-11 ENCOUNTER — Encounter: Payer: Self-pay | Admitting: Nurse Practitioner

## 2020-09-11 VITALS — BP 128/74 | HR 84 | Ht <= 58 in | Wt 177.0 lb

## 2020-09-11 DIAGNOSIS — H04123 Dry eye syndrome of bilateral lacrimal glands: Secondary | ICD-10-CM | POA: Diagnosis not present

## 2020-09-11 DIAGNOSIS — I6523 Occlusion and stenosis of bilateral carotid arteries: Secondary | ICD-10-CM | POA: Diagnosis not present

## 2020-09-11 DIAGNOSIS — I5032 Chronic diastolic (congestive) heart failure: Secondary | ICD-10-CM | POA: Diagnosis not present

## 2020-09-11 DIAGNOSIS — H26492 Other secondary cataract, left eye: Secondary | ICD-10-CM | POA: Diagnosis not present

## 2020-09-11 DIAGNOSIS — I10 Essential (primary) hypertension: Secondary | ICD-10-CM

## 2020-09-11 DIAGNOSIS — E785 Hyperlipidemia, unspecified: Secondary | ICD-10-CM

## 2020-09-11 DIAGNOSIS — H40013 Open angle with borderline findings, low risk, bilateral: Secondary | ICD-10-CM | POA: Diagnosis not present

## 2020-09-11 DIAGNOSIS — I739 Peripheral vascular disease, unspecified: Secondary | ICD-10-CM

## 2020-09-11 DIAGNOSIS — H35033 Hypertensive retinopathy, bilateral: Secondary | ICD-10-CM | POA: Diagnosis not present

## 2020-09-11 NOTE — Patient Instructions (Signed)
Medication Instructions:  Your physician recommends that you continue on your current medications as directed. Please refer to the Current Medication list given to you today.  *If you need a refill on your cardiac medications before your next appointment, please call your pharmacy*   Lab Work: None ordered If you have labs (blood work) drawn today and your tests are completely normal, you will receive your results only by: . MyChart Message (if you have MyChart) OR . A paper copy in the mail If you have any lab test that is abnormal or we need to change your treatment, we will call you to review the results.   Testing/Procedures: None ordered   Follow-Up: At [facility], you and your health needs are our priority.  As part of our continuing mission to provide you with exceptional heart care, we have created designated Provider Care Teams.  These Care Teams include your primary Cardiologist (physician) and Advanced Practice Providers (APPs -  Physician Assistants and Nurse Practitioners) who all work together to provide you with the care you need, when you need it.  We recommend signing up for the patient portal called "MyChart".  Sign up information is provided on this After Visit Summary.  MyChart is used to connect with patients for Virtual Visits (Telemedicine).  Patients are able to view lab/test results, encounter notes, upcoming appointments, etc.  Non-urgent messages can be sent to your provider as well.   To learn more about what you can do with MyChart, go to [email or web address].    Your next appointment:   6 month(s)  The format for your next appointment:   In Person  Provider:   You may see Timothy Gollan, MD or one of the following Advanced Practice Providers on your designated Care Team:    Christopher Berge, NP  Ryan Dunn, PA-C  Jacquelyn Visser, PA-C  Cadence Furth, PA-C  Caitlin Walker, NP    Other Instructions N/A  

## 2020-09-11 NOTE — Progress Notes (Signed)
Office Visit    Patient Name: [name] [name] Date of Encounter: 09/11/2020  Primary Care Provider:  [name], MD Primary Cardiologist:  Julien Nordmann, MD  Chief Complaint    74 year old female with a history of HFpEF, stroke, COPD on home O2, mild to moderate aortic stenosis/mild aortic insufficiency, peripheral arterial disease, cerebral arterial disease, hypertension, hyperlipidemia, spinal compression fractures, osteoporosis, morbid obesity, depression, frequent falls, and severe deconditioning who presents for follow-up of CHF.  Past Medical History    Past Medical History:  Diagnosis Date  . (HFpEF) heart failure with preserved ejection fraction (HCC)    a. 11/2019 Echo: EF 60-65%, no rwma. Mild LVH. Mild AI, mild to mod AS.  [name] Aneurysm (HCC)    She had 2.6 cm dilation of the infrarenal abdominal aorta on 08/01/15 CTA with 5 year [place] recommended  . Anxiety   . Anxiety and depression   . Aortic stenosis    mild-moderate AS 12/23/19 echo  . Carotid stenosis    a. 06/2020 Carotid/Cerebral angio: LICA 70, L Vert 50, RCCA 50-60d, RICA 60-70p, 3.3x2.3 RICA aneurysm, RMCA M1 50-70.  [name] CHF (congestive heart failure) (HCC)   . Compression fracture of body of thoracic vertebra (HCC)    T 10  . Concussion 08/03/2015  . COPD (chronic obstructive pulmonary disease) (HCC) 12/2012   spirometry: Pre: FVC 84%, FEV1 69%, ratio 0.64 consistent with moderate obstruction.  . Depression   . Fall 08/03/2015   d/c home health 08/2015  . Fracture of cervical vertebra, C5 (HCC) 08/06/2015  . History of chicken pox   . History of stress test    a. 01/2019 MV: No isch/infarct. EF 71%.  . Hyperlipidemia   . Hypertension   . Hypothyroidism   . Lower back pain    h/o HNP s/p surgery  . Neuropathy    B/L feet  . On supplemental oxygen by nasal cannula    at HS and PRN during the day  . Osteoarthritis    h/o ruptured disc s/p ESI  . Osteoporosis 11/2010   DEXA -2.7 spine, thoracic  compression fracture  . Peripheral vascular disease (HCC)   . Smoker    quit 10/2012  . Stroke [facility]) 2010   x3 with residual R hemiparesis, s/p R MCA balloon angioplasty (2010)  . Wears dentures    upper   Past Surgical History:  Procedure Laterality Date  . APPENDECTOMY  1960  . CATARACT EXTRACTION     bilateral  . CESAREAN SECTION    . CHOLECYSTECTOMY  1970  . COLONOSCOPY  2004   diverticulosis, no polyps Jarold Motto)  . COLONOSCOPY  05/2016   decreased sphincter tone, diverticulosis, no f/u recommended (Danis)  . DEXA  11/2010   T -2.7 spine, -1.9 hip  . HIP SURGERY Left 2006   fractured - screws placed  . IR ANGIO INTRA EXTRACRAN SEL COM CAROTID INNOMINATE BILAT MOD SED  08/01/2018  . IR ANGIO INTRA EXTRACRAN SEL COM CAROTID INNOMINATE BILAT MOD SED  12/10/2018  . IR ANGIO INTRA EXTRACRAN SEL COM CAROTID INNOMINATE BILAT MOD SED  06/26/2020  . IR ANGIO VERTEBRAL SEL SUBCLAVIAN INNOMINATE UNI L MOD SED  06/26/2020  . IR ANGIO VERTEBRAL SEL SUBCLAVIAN INNOMINATE UNI R MOD SED  08/01/2018  . IR ANGIO VERTEBRAL SEL SUBCLAVIAN INNOMINATE UNI R MOD SED  12/10/2018  . IR ANGIO VERTEBRAL SEL VERTEBRAL UNI L MOD SED  12/10/2018  . IR RADIOLOGIST EVAL & MGMT  01/09/2018  .  KYPHOPLASTY  10/02/2017   Procedure: LUMBAR FOUR KYPHOPLASTY;  Surgeon: Coletta Memosabbell, Kyle, MD;  [facility];  Service: Neurosurgery;;  . KYPHOPLASTY N/A 01/29/2020   Procedure: Thoracic Ten Kyphoplasty;  Surgeon: Coletta Memosabbell, Kyle, MD;  [facility];  Service: Neurosurgery;  Laterality: N/A;  . RADIOLOGY WITH ANESTHESIA N/A 11/11/2015   Procedure: RADIOLOGY WITH ANESTHESIA;  Surgeon: Julieanne CottonSanjeev Deveshwar, MD;  Location: [facility] OR;  Service: Radiology;  Laterality: N/A;  . RADIOLOGY WITH ANESTHESIA N/A 12/10/2018   Procedure: STENTING;  Surgeon: Julieanne Cottoneveshwar, Sanjeev, MD;  Location: [facility] OR;  Service: Radiology;  Laterality: N/A;    Allergies  Allergies  Allergen Reactions  . Doxycycline Nausea Only and Other (See Comments)    Sig GI  upset  . Oxycodone Other (See Comments)    Overly sedating, hypotension, decreased oxgyen  . Fluticasone-Salmeterol Anxiety    History of Present Illness    74 year old female with the above complex past medical history including HFpEF, stroke, cerebral arterial disease, peripheral arterial disease, COPD on home O2, mild to moderate aortic stenosis, mild aortic insufficiency, hypertension, hyperlipidemia, spinal compression fractures, osteoporosis, morbid obesity, depression, frequent falls, and severe deconditioning.  She previously had nonischemic stress testing in May 2020.  In late March, [facility] was [facility] with generalized fatigue, weakness, and muscle spasms.  Troponins were mildly elevated at 403 and then 392.  CTA of the chest was negative for PE.  Echocardiogram showed normal LV function with mild to moderate aortic stenosis and mild aortic insufficiency.  Benazepril therapy was discontinued secondary to soft blood pressures and she was subsequently discharged.  At follow-up in April, she reported ongoing fatigue but was working with physical therapy.  She was stable at last cardiology visit in June of this year.  In the setting of known bilateral carotid arterial disease, she underwent carotid and cranial angiography revealing a 70% left internal carotid artery stenosis, 50% left vertebral artery stenosis, 50 to 60% distal right common carotid artery stenosis, 67% proximal right internal carotid artery stenosis, 50 to 70% stenosis of the right middle cerebral artery M1 segment, and a 3.3 x 2.3 mm aneurysm of the right ICA.  Conservative therapy recommended with follow-up MRI in 1 year and carotid ultrasound in 6 months.  Since her last visit, she has done well from a cardiac standpoint.  She is fairly sedentary and she tires easily but notes that she can walk around the grocery store as long as she has a cart.  She has some degree of chronic dyspnea on exertion which is  unchanged.  She denies chest pain, palpitations, PND, orthopnea, dizziness, syncope, edema, or early satiety.  She is currently on a more Mediterranean like diet and has lost a little bit of weight.  She is looking forward to losing more weight.  Home Medications    Prior to Admission medications   Medication Sig Start Date End Date Taking? Authorizing Provider  acetaminophen (TYLENOL) 500 MG tablet Take 1 tablet (500 mg total) by mouth every 6 (six) hours as needed for headache. Patient taking differently: Take 500-1,000 mg by mouth every 6 (six) hours as needed for headache.  01/01/19   Eustaquio BoydenGutierrez, Javier, MD  albuterol (VENTOLIN HFA) 108 (90 Base) MCG/ACT inhaler INHALE 2 PUFFS INTO THE LUNGS EVERY 6 (SIX) HOURS AS NEEDED FOR WHEEZING OR SHORTNESS OF BREATH. 02/03/20   Eustaquio BoydenGutierrez, Javier, MD  alendronate (FOSAMAX) 70 MG tablet TAKE 1 TABLET WEEKLY. TAKE WITH A FULL GLASS OF WATER ON AN  EMPTY STOMACH. 08/05/20   [name], MD  ascorbic acid (VITAMIN C) 250 MG CHEW Chew 500 mg by mouth daily.    [provider]  aspirin (ASPIRIN 81) 81 MG EC tablet Take 1 tablet (81 mg total) by mouth daily. Swallow whole. 12/15/18   [name], MD  atorvastatin (LIPITOR) 40 MG tablet Take 1 tablet (40 mg total) by mouth daily. 09/09/20   [name], MD  calcium carbonate (TUMS - DOSED IN MG ELEMENTAL CALCIUM) 500 MG chewable tablet Chew 1 tablet by mouth daily as needed for indigestion or heartburn.    [provider]  cetirizine (ZYRTEC) 10 MG tablet Take 10 mg by mouth daily.    [provider]  Cholecalciferol (VITAMIN D3) 125 MCG (5000 UT) CAPS Take 5,000 Units by mouth daily. Gummies    [provider]  clopidogrel (PLAVIX) 75 MG tablet Take 1 tablet (75 mg total) by mouth daily. 09/09/20   [name], MD  Cranberry-Vitamin C (AZO CRANBERRY URINARY TRACT PO) Take 1 tablet by mouth in the morning and at bedtime.     [provider]  ezetimibe  (ZETIA) 10 MG tablet Take 1 tablet (10 mg total) by mouth daily. 03/04/20 06/23/20  Antonieta Iba, MD  furosemide (LASIX) 40 MG tablet Take 20 mg by mouth daily as needed for fluid. Weight every morning before given 11/21/19   [name], MD  gabapentin (NEURONTIN) 100 MG capsule Take 1 capsule (100 mg total) by mouth at bedtime. 09/09/20   [name], MD  levothyroxine (SYNTHROID) 50 MCG tablet Take 1 tablet (50 mcg total) by mouth daily before breakfast. 08/12/20   [name], MD  Melatonin 5 MG CHEW Chew 10 mg by mouth at bedtime. Gummies     [provider]  Multiple Vitamins-Minerals (MULTIVITAMIN PO) Take 1 tablet by mouth daily. Gummie    [provider]  nitrofurantoin (MACRODANTIN) 50 MG capsule Take 1 capsule (50 mg total) by mouth daily. 06/03/20   Sondra Come, MD  OXYGEN Inhale 2 L/min into the lungs See admin instructions. Inhale 2 L/min of oxygen at bedtime and during the day as needed for shortness of breath    [provider]  pantoprazole (PROTONIX) 40 MG tablet TAKE 1 TABLET EVERY DAY AS NEEDED 09/10/20   [name], MD  Potassium Chloride ER 20 MEQ TBCR TAKE 2 TABLETS BY MOUTH AS NEEDED (WHENEVER  YOU  TAKE  LASIX) Patient taking differently: Take 20 mEq by mouth daily as needed (when taking lasix).  11/07/19   [name], MD  sertraline (ZOLOFT) 100 MG tablet Take 1 tablet (100 mg total) by mouth daily. 08/12/20   [name], MD  traMADol (ULTRAM) 50 MG tablet Take 1 tablet (50 mg total) by mouth 2 (two) times daily as needed for moderate pain. 08/12/20   [name], MD  traZODone (DESYREL) 100 MG tablet Take 1 tablet (100 mg total) by mouth at bedtime. 09/09/20   [name], MD  zinc gluconate 50 MG tablet Take 50 mg by mouth daily.    [provider]    Review of Systems    Some degree of chronic dyspnea on exertion which is unchanged.  She denies chest pain, palpitations, PND,  orthopnea, dizziness, syncope, edema, or early satiety.  All other systems reviewed and are otherwise negative except as noted above.  Physical Exam    VS:  BP 128/74 (BP Location: Right Arm, Patient Position: Sitting, Cuff Size: Normal)  Pulse 84   Ht 4\' 10"  (1.473 m)   Wt 177 lb (80.3 kg)   SpO2 95%   BMI 36.99 kg/m  , BMI Body mass index is 36.99 kg/m. GEN: Well nourished, well developed, in no acute distress. HEENT: normal. Neck: Supple, no JVD, carotid bruits, or masses. Cardiac: RRR, 2/6 systolic murmur at the upper sternal borders, no rubs, or gallops. No clubbing, cyanosis, trace bilateral pedal edema.  Radials 2+/PT 1+ and equal bilaterally.  Respiratory:  Respirations regular and unlabored, clear to auscultation bilaterally. GI: Soft, nontender, nondistended, BS + x 4. MS: no deformity or atrophy. Skin: warm and dry, no rash. Neuro:  Strength and sensation are intact. Psych: Normal affect.  Accessory Clinical Findings     Lab Results  Component Value Date   WBC 7.5 08/05/2020   HGB 12.9 08/05/2020   HCT 38.7 08/05/2020   MCV 89.7 08/05/2020   PLT 177.0 08/05/2020   Lab Results  Component Value Date   CREATININE 1.10 08/05/2020   BUN 26 (H) 08/05/2020   NA 138 08/05/2020   K 4.8 08/05/2020   CL 100 08/05/2020   CO2 31 08/05/2020   Lab Results  Component Value Date   ALT 20 08/05/2020   AST 25 08/05/2020   ALKPHOS 89 08/05/2020   BILITOT 0.5 08/05/2020   Lab Results  Component Value Date   CHOL 147 08/05/2020   HDL 67.80 08/05/2020   LDLCALC 63 08/05/2020   LDLDIRECT 84.0 09/11/2017   TRIG 80.0 08/05/2020   CHOLHDL 2 08/05/2020    Lab Results  Component Value Date   HGBA1C 5.1 07/31/2018    Assessment & Plan    1.  Chronic heart failure with preserved ejection fraction: Euvolemic on examination today with stable heart rate and blood pressure.  Has not required furosemide recently.  Continue current medications.  2.  Essential hypertension:  Stable off of antihypertensives.  Previously on benazepril but this was discontinued in the spring.  3.  Hyperlipidemia: She remains on statin and Zetia therapy with an LDL of sixty-three in November.  She is also made significant dietary adjustments.  4.  Peripheral arterial disease/carotid arterial disease: Status post recent carotid and cerebral angiography showing moderate, stable disease.  Remains on aspirin Plavix and statin therapy.  5.  Prior strokes: She remains on aspirin, Plavix, and statin therapy.  No new symptoms.  6.  COPD: No active wheezing on exam.  Uses oxygen as needed during the day and nightly.  7.  Disposition: Follow-up in 6 months or sooner if necessary.  December, NP 09/11/2020, 2:54 PM

## 2020-09-16 DIAGNOSIS — R269 Unspecified abnormalities of gait and mobility: Secondary | ICD-10-CM | POA: Diagnosis not present

## 2020-09-16 DIAGNOSIS — J449 Chronic obstructive pulmonary disease, unspecified: Secondary | ICD-10-CM | POA: Diagnosis not present

## 2020-09-20 ENCOUNTER — Other Ambulatory Visit: Payer: Self-pay | Admitting: Family Medicine

## 2020-10-29 ENCOUNTER — Ambulatory Visit: Payer: Self-pay | Admitting: Urology

## 2020-11-03 ENCOUNTER — Telehealth: Payer: Self-pay

## 2020-11-03 NOTE — Chronic Care Management (AMB) (Addendum)
Chronic Care Management Pharmacy Assistant   Name: [name] [name]  MRN: [id number] DOB: [birth year]  Reason for Encounter: Disease State- Hypertension and Hyperlipidemia     PCP : [name], MD  Allergies:   Allergies  Allergen Reactions   Doxycycline Nausea Only and Other (See Comments)    Sig GI upset   Oxycodone Other (See Comments)    Overly sedating, hypotension, decreased oxgyen   Fluticasone-Salmeterol Anxiety    Medications: Outpatient Encounter Medications as of 11/03/2020  Medication Sig   acetaminophen (TYLENOL) 500 MG tablet Take 1 tablet (500 mg total) by mouth every 6 (six) hours as needed for headache. (Patient taking differently: Take 500-1,000 mg by mouth every 6 (six) hours as needed for headache.)   albuterol (VENTOLIN HFA) 108 (90 Base) MCG/ACT inhaler INHALE 2 PUFFS INTO THE LUNGS EVERY 6 (SIX) HOURS AS NEEDED FOR WHEEZING OR SHORTNESS OF BREATH.   alendronate (FOSAMAX) 70 MG tablet TAKE 1 TABLET WEEKLY. TAKE WITH A FULL GLASS OF WATER ON AN EMPTY STOMACH.   ascorbic acid (VITAMIN C) 250 MG CHEW Chew 500 mg by mouth daily.   aspirin (ASPIRIN 81) 81 MG EC tablet Take 1 tablet (81 mg total) by mouth daily. Swallow whole.   atorvastatin (LIPITOR) 40 MG tablet Take 1 tablet (40 mg total) by mouth daily.   calcium carbonate (TUMS - DOSED IN MG ELEMENTAL CALCIUM) 500 MG chewable tablet Chew 1 tablet by mouth daily as needed for indigestion or heartburn.   cetirizine (ZYRTEC) 10 MG tablet Take 10 mg by mouth daily.   Cholecalciferol (VITAMIN D3) 125 MCG (5000 UT) CAPS Take 5,000 Units by mouth daily. Gummies   clopidogrel (PLAVIX) 75 MG tablet Take 1 tablet (75 mg total) by mouth daily.   Cranberry-Vitamin C (AZO CRANBERRY URINARY TRACT PO) Take 1 tablet by mouth in the morning and at bedtime.    ezetimibe (ZETIA) 10 MG tablet Take 1 tablet (10 mg total) by mouth daily.   furosemide (LASIX) 40 MG tablet Take 20 mg by mouth daily as needed for fluid.  Weight every morning before given   gabapentin (NEURONTIN) 100 MG capsule Take 1 capsule (100 mg total) by mouth at bedtime.   levothyroxine (SYNTHROID) 50 MCG tablet Take 1 tablet (50 mcg total) by mouth daily before breakfast.   Melatonin 5 MG CHEW Chew 10 mg by mouth at bedtime. Gummies   Multiple Vitamins-Minerals (MULTIVITAMIN PO) Take 1 tablet by mouth daily. Gummie   nitrofurantoin (MACRODANTIN) 50 MG capsule Take 1 capsule (50 mg total) by mouth daily.   OXYGEN Inhale 2 L/min into the lungs See admin instructions. Inhale 2 L/min of oxygen at bedtime and during the day as needed for shortness of breath   pantoprazole (PROTONIX) 40 MG tablet TAKE 1 TABLET EVERY DAY AS NEEDED   Potassium Chloride ER 20 MEQ TBCR TAKE 2 TABLETS BY MOUTH AS NEEDED (WHENEVER  YOU  TAKE  LASIX) (Patient taking differently: Take 20 mEq by mouth daily as needed (when taking lasix).)   sertraline (ZOLOFT) 100 MG tablet Take 1 tablet (100 mg total) by mouth daily.   traMADol (ULTRAM) 50 MG tablet Take 1 tablet (50 mg total) by mouth 2 (two) times daily as needed for moderate pain.   traZODone (DESYREL) 100 MG tablet Take 1 tablet (100 mg total) by mouth at bedtime.   trimethoprim (TRIMPEX) 100 MG tablet    zinc gluconate 50 MG tablet Take 50 mg by mouth daily.  No facility-administered encounter medications on file as of 11/03/2020.    Current Diagnosis: Patient Active Problem List   Diagnosis Date Noted   History of recurrent UTIs 02/18/2020   Closed T10 fracture (HCC) 01/29/2020   Acute midline back pain 12/21/2019   Neuropathy 05/06/2019   CKD (chronic kidney disease) stage 3, GFR 30-59 ml/min (HCC) 01/24/2019   Tinnitus of both ears 11/24/2018   Vision loss of left eye 07/31/2018   Thrombocytopenia (HCC) 07/31/2018   Tremor 07/03/2018   Chronic diastolic heart failure (HCC) 01/30/2018   Memory loss 01/30/2018   Peripheral edema 01/30/2018   Intertrigo 01/30/2018   Chronic back pain 11/21/2017    Chronic respiratory failure with hypoxia (HCC) 11/14/2017   Shortness of breath 11/14/2017   Closed compression fracture of fourth lumbar vertebra (HCC) 10/02/2017   Compression fracture of body of thoracic vertebra (HCC) 09/30/2017   Hypothyroidism (acquired) 06/28/2017   Vitamin D deficiency 06/25/2017   Advanced care planning/counseling discussion 01/29/2015   Encounter for general adult medical examination with abnormal findings 01/29/2015   Carotid stenosis 04/03/2014   Carotid artery aneurysm (HCC) 04/03/2014   Recurrent falls 01/02/2014   Hemiparesis affecting right side as late effect of stroke (HCC) 01/02/2014   Insomnia 01/10/2013   Medicare annual wellness visit, subsequent 11/26/2012   Hypertension    History of lacunar cerebrovascular accident (CVA)    Peripheral vascular disease (HCC)    COPD (chronic obstructive pulmonary disease) (HCC)    Hyperlipidemia    Osteoarthritis    MDD (major depressive disorder), recurrent episode, moderate (HCC)    Ex-smoker    Osteoporosis 11/25/2010    Goals Addressed   None    Reviewed chart prior to disease state call. Spoke with patient regarding BP  Recent Office Vitals: BP Readings from Last 3 Encounters:  09/11/20 128/74  08/12/20 116/80  06/26/20 (!) 138/36   Pulse Readings from Last 3 Encounters:  09/11/20 84  08/12/20 98  06/26/20 74    Wt Readings from Last 3 Encounters:  09/11/20 177 lb (80.3 kg)  08/12/20 180 lb 7 oz (81.8 kg)  06/26/20 180 lb (81.6 kg)     Kidney Function Lab Results  Component Value Date/Time   CREATININE 1.10 08/05/2020 11:08 AM   CREATININE 0.92 06/26/2020 07:16 AM   GFR 49.72 (L) 08/05/2020 11:08 AM   GFRNONAA >60 06/26/2020 07:16 AM   GFRAA >60 06/26/2020 07:16 AM    BMP Latest Ref Rng & Units 08/05/2020 06/26/2020 02/20/2020  Glucose 70 - 99 mg/dL 91 062(B) 98  BUN 6 - 23 mg/dL 76(E) 21 83(T)  Creatinine 0.40 - 1.20 mg/dL 5.17 6.16 0.73  Sodium 135 - 145 mEq/L 138 140 138   Potassium 3.5 - 5.1 mEq/L 4.8 4.6 4.3  Chloride 96 - 112 mEq/L 100 103 107  CO2 19 - 32 mEq/L 31 30 26   Calcium 8.4 - 10.5 mg/dL 9.5 8.9 )    Current antihypertensive regimen:  No pharmacotherapy  How often are you checking your Blood Pressure? Checks 1-2 times a week  Current home BP readings: N/A states her readings are on the monitor in another room and does not feel well enough to get it. Patient notes BP has been doing very well. Will follow up in 2 weeks to review.    Wrist or arm cuff: Arm cuff Caffeine intake: 1 Cup of coffee a day Salt intake: Does not add salt to foods.  OTC medications including pseudoephedrine or NSAIDs? No  What  recent interventions/DTPs have been made by any provider to improve Blood Pressure control since last CPP Visit: No recent interventions  Any recent hospitalizations or ED visits since last visit with CPP? No   What diet changes have been made to improve Blood Pressure Control?  Patient states she is doing a low cholesterol, low sodium and low calorie diet plan through [employer]. Notes she is down to 173.  What exercise is being done to improve your Blood Pressure Control?  No formal exercise. Patient notes she walks as much as she can.   Adherence Review: Is the patient currently on ACE/ARB medication? No  Comprehensive medication review performed; Spoke to patient regarding cholesterol  Lipid Panel    Component Value Date/Time   CHOL 147 08/05/2020 1108   TRIG 80.0 08/05/2020 1108   HDL 67.80 08/05/2020 1108   LDLCALC 63 08/05/2020 1108   LDLDIRECT 84.0 09/11/2017 1107    10-year ASCVD risk score: The 10-year ASCVD risk score Denman George DC Jr., et al., 2013) is: 18.2%   Values used to calculate the score:     Age: 51 years     Sex: Female     Is Non-Hispanic African American: No     Diabetic: No     Tobacco smoker: No     Systolic Blood Pressure: 128 mmHg     Is BP treated: Yes     HDL Cholesterol: 67.8 mg/dL     Total  Cholesterol: 147 mg/dL  Current antihyperlipidemic regimen:  Atorvastatin 40 mg 1 tablet daily  Previous antihyperlipidemic medications tried: Zetia  ASCVD risk enhancing conditions: age >74, HTN, CKD and current smoker   What recent interventions/DTPs have been made by any provider to improve Cholesterol control since last CPP Visit: No recent interventions  Any recent hospitalizations or ED visits since last visit with CPP? No   What diet changes have been made to improve Cholesterol?  Patient states she is doing a low cholesterol, low sodium and low calorie diet plan through [employer].   What exercise is being done to improve Cholesterol?  No formal exercise. Patient notes she walks as much as she can.   Adherence Review: Does the patient have >5 day gap between last estimated fill dates? CPP to review  Patient did not have BP readings available. Set up follow up call with her 11/13/20 to review log.   Follow-Up:  Pharmacist Review  Phil Dopp, CPP notified  Jomarie Longs, [facility] Clinical Pharmacy Assistant [phone]  Reviewed refill history. Atorvastatin refills timely. Zetia last filled 05/2020 90 DS. Do not see where this was discontinued. Please contact pt to see if she is still taking this and did she have any problems with it? She should continue taking daily. Also, was unaware patient currently smoking cigarettes. If so, please see if she is interested in medication assistance with quitting smoking. Scheduled a follow up CCM visit next month.  Phil Dopp, PharmD Clinical Pharmacist [facility] [phone]

## 2020-11-05 ENCOUNTER — Encounter: Payer: Self-pay | Admitting: Urology

## 2020-11-05 ENCOUNTER — Ambulatory Visit: Payer: Medicare Other | Admitting: Urology

## 2020-11-05 ENCOUNTER — Other Ambulatory Visit: Payer: Self-pay

## 2020-11-05 VITALS — BP 164/80 | HR 91 | Ht <= 58 in | Wt 173.0 lb

## 2020-11-05 DIAGNOSIS — N3281 Overactive bladder: Secondary | ICD-10-CM

## 2020-11-05 DIAGNOSIS — N390 Urinary tract infection, site not specified: Secondary | ICD-10-CM

## 2020-11-05 LAB — MICROSCOPIC EXAMINATION
Bacteria, UA: NONE SEEN
WBC, UA: NONE SEEN /hpf (ref 0–5)

## 2020-11-05 LAB — URINALYSIS, COMPLETE
Bilirubin, UA: NEGATIVE
Glucose, UA: NEGATIVE
Ketones, UA: NEGATIVE
Leukocytes,UA: NEGATIVE
Nitrite, UA: NEGATIVE
Protein,UA: NEGATIVE
RBC, UA: NEGATIVE
Specific Gravity, UA: 1.025 (ref 1.005–1.030)
Urobilinogen, Ur: 1 mg/dL (ref 0.2–1.0)
pH, UA: 7 (ref 5.0–7.5)

## 2020-11-05 MED ORDER — NITROFURANTOIN MONOHYD MACRO 100 MG PO CAPS: 100.0000 mg | ORAL_CAPSULE | Freq: Two times a day (BID) | ORAL | 0 refills | Status: AC

## 2020-11-05 NOTE — Progress Notes (Signed)
   11/05/2020 5:18 PM   [name] [name] [birth year] [id number]  Reason for visit: Follow up recurrent UTIs, OAB/mixed incontinence  HPI: I saw [name] back in urology clinic for the above issues.  She is a frail 75 year old female who is wheelchair-bound.  I last saw her in August 2021 and had her on 50 mg nitrofurantoin prophylaxis for 6 months.  She reports that her OAB symptoms have worsened significantly and she is having frequency, urgency, and urge incontinence during the day and night requiring a depends.  She also feels she may have a UTI with some foul-smelling urine, but she denies any dysuria.  She is not currently on any OAB medications.  She stopped taking the Macrobid prophylaxis in the last few weeks for unclear reasons.  Urinalysis pending today.  We discussed other options for her OAB symptoms.  She is not a candidate for anticholinergics with her age and frailty, but I think a trial of Myrbetriq is very reasonable.  Samples were given today.  I also discussed behavioral strategies.  Macrobid 100 mg twice daily x5 days for suspected UTI, call with UA and culture results Start topical estrogen cream for history of recurrent UTIs Trial of Myrbetriq 50 mg daily for OAB RTC 4 weeks symptom check   Sondra Come, MD  [facility] [address] [place], [place] [address] [phone]

## 2020-11-05 NOTE — Patient Instructions (Addendum)
please start topical estrogen cream       Overactive Bladder, Adult  Overactive bladder is a condition in which a person has a sudden and frequent need to urinate. A person might also leak urine if he or she cannot get to the bathroom fast enough (urinary incontinence). Sometimes, symptoms can interfere with work or social activities. What are the causes? Overactive bladder is associated with poor nerve signals between your bladder and your brain. Your bladder may get the signal to empty before it is full. You may also have very sensitive muscles that make your bladder squeeze too soon. This condition may also be caused by other factors, such as:  Medical conditions: ? Urinary tract infection. ? Infection of nearby tissues. ? Prostate enlargement. ? Bladder stones, inflammation, or tumors. ? Diabetes. ? Muscle or nerve weakness, especially from these conditions:  A spinal cord injury.  Stroke.  Multiple sclerosis.  Parkinson's disease.  Other causes: ? Surgery on the uterus or urethra. ? Drinking too much caffeine or alcohol. ? Certain medicines, especially those that eliminate extra fluid in the body (diuretics). ? Constipation. What increases the risk? You may be at greater risk for overactive bladder if you:  Are an older adult.  Smoke.  Are going through menopause.  Have prostate problems.  Have a neurological disease, such as stroke, dementia, Parkinson's disease, or multiple sclerosis (MS).  Eat or drink alcohol, spicy food, caffeine, and other things that irritate the bladder.  Are overweight or obese. What are the signs or symptoms? Symptoms of this condition include a sudden, strong urge to urinate. Other symptoms include:  Leaking urine.  Urinating 8 or more times a day.  Waking up to urinate 2 or more times overnight. How is this diagnosed? This condition may be diagnosed based on:  Your symptoms and medical history.  A physical exam.  Blood  or urine tests to check for possible causes, such as infection. You may also need to see a health care provider who specializes in urinary tract problems. This is called a urologist. How is this treated? Treatment for overactive bladder depends on the cause of your condition and whether it is mild or severe. Treatment may include:  Bladder training, such as: ? Learning to control the urge to urinate by following a schedule to urinate at regular intervals. ? Doing Kegel exercises to strengthen the pelvic floor muscles that support your bladder.  Special devices, such as: ? Biofeedback. This uses sensors to help you become aware of your body's signals. ? Electrical stimulation. This uses electrodes placed inside the body (implanted) or outside the body. These electrodes send gentle pulses of electricity to strengthen the nerves or muscles that control the bladder. ? Women may use a plastic device, called a pessary, that fits into the vagina and supports the bladder.  Medicines, such as: ? Antibiotics to treat bladder infection. ? Antispasmodics to stop the bladder from releasing urine at the wrong time. ? Tricyclic antidepressants to relax bladder muscles. ? Injections of botulinum toxin type A directly into the bladder tissue to relax bladder muscles.  Surgery, such as: ? A device may be implanted to help manage the nerve signals that control urination. ? An electrode may be implanted to stimulate electrical signals in the bladder. ? A procedure may be done to change the shape of the bladder. This is done only in very severe cases. Follow these instructions at home: Eating and drinking  Make diet or lifestyle changes recommended  by your health care provider. These may include: ? Drinking fluids throughout the day and not only with meals. ? Cutting down on caffeine or alcohol. ? Eating a healthy and balanced diet to prevent constipation. This may include:  Choosing foods that are high  in fiber, such as beans, whole grains, and fresh fruits and vegetables.  Limiting foods that are high in fat and processed sugars, such as fried and sweet foods.   Lifestyle  Lose weight if needed.  Do not use any products that contain nicotine or tobacco. These include cigarettes, chewing tobacco, and vaping devices, such as e-cigarettes. If you need help quitting, ask your health care provider.   General instructions  Take over-the-counter and prescription medicines only as told by your health care provider.  If you were prescribed an antibiotic medicine, take it as told by your health care provider. Do not stop taking the antibiotic even if you start to feel better.  Use any implants or pessary as told by your health care provider.  If needed, wear pads to absorb urine leakage.  Keep a log to track how much and when you drink, and when you need to urinate. This will help your health care provider monitor your condition.  Keep all follow-up visits. This is important. Contact a health care provider if:  You have a fever or chills.  Your symptoms do not get better with treatment.  Your pain and discomfort get worse.  You have more frequent urges to urinate. Get help right away if:  You are not able to control your bladder. Summary  Overactive bladder refers to a condition in which a person has a sudden and frequent need to urinate.  Several conditions may lead to an overactive bladder.  Treatment for overactive bladder depends on the cause and severity of your condition.  Making lifestyle changes, doing Kegel exercises, keeping a log, and taking medicines can help with this condition. This information is not intended to replace advice given to you by your health care provider. Make sure you discuss any questions you have with your health care provider. Document Revised: 06/01/2020 Document Reviewed: 06/01/2020 Elsevier Patient Education  2021 [employer].

## 2020-11-06 ENCOUNTER — Telehealth: Payer: Self-pay

## 2020-11-06 DIAGNOSIS — N390 Urinary tract infection, site not specified: Secondary | ICD-10-CM

## 2020-11-06 MED ORDER — ESTRADIOL 0.1 MG/GM VA CREA: TOPICAL_CREAM | VAGINAL | 12 refills | Status: AC

## 2020-11-06 NOTE — Telephone Encounter (Signed)
Called pt informed her of the information below. Pt gave verbal understanding. Pt states that she does not have RX for estrogen cream, RX sent.

## 2020-11-06 NOTE — Telephone Encounter (Signed)
-----   Message from Sondra Come, MD sent at 11/05/2020  5:22 PM EST ----- No evidence of infection on urinalysis, she can stop the Macrobid.  Continue topical estrogen cream and Myrbetriq, and keep 1 month follow-up for OAB symptom check.  Okay to continue cranberry tablets as well for UTI prevention.  Legrand Rams, MD 11/05/2020

## 2020-11-10 LAB — CULTURE, URINE COMPREHENSIVE

## 2020-11-13 NOTE — Chronic Care Management (AMB) (Addendum)
Contacted patient for updated blood pressure log. She states that her urologist has put her on a new medication that increases her blood pressure but does not feel like her blood pressure has changed since starting it.   Readings are as follows: 132/60 140/55  Informed patient about upcoming appointment with Phil Dopp 12/15/20 at 10:30. Patient aware she will need to have a log of her blood pressure readings and medications to review with Marcelino Duster. Inquired about patients smoking status, she states she does not currently smoke. She quit about 9 years ago. Patient states she is still taking Zetia- she thinks. Her daughter fills up her pill box and she thinks the Zetia is in there. Patients daughter not available today to ask.     Follow-Up:  Pharmacist Review  Phil Dopp, CPP notified  Jomarie Longs, [facility] Clinical Pharmacy Assistant [phone]  I have reviewed the care management and care coordination activities outlined in this encounter and I am certifying that I agree with the content of this note. Will review BP at follow up.  Phil Dopp, PharmD Clinical Pharmacist [facility] [phone]

## 2020-11-17 DIAGNOSIS — R269 Unspecified abnormalities of gait and mobility: Secondary | ICD-10-CM | POA: Diagnosis not present

## 2020-11-17 DIAGNOSIS — J449 Chronic obstructive pulmonary disease, unspecified: Secondary | ICD-10-CM | POA: Diagnosis not present

## 2020-12-03 ENCOUNTER — Encounter: Payer: Self-pay | Admitting: Urology

## 2020-12-03 ENCOUNTER — Other Ambulatory Visit: Payer: Self-pay

## 2020-12-03 ENCOUNTER — Ambulatory Visit: Payer: Medicare HMO | Admitting: Urology

## 2020-12-03 VITALS — Ht 59.0 in | Wt 173.0 lb

## 2020-12-03 DIAGNOSIS — N390 Urinary tract infection, site not specified: Secondary | ICD-10-CM | POA: Diagnosis not present

## 2020-12-03 DIAGNOSIS — N3281 Overactive bladder: Secondary | ICD-10-CM | POA: Diagnosis not present

## 2020-12-03 MED ORDER — MIRABEGRON ER 50 MG PO TB24: 50.0000 mg | ORAL_TABLET | Freq: Every day | ORAL | 11 refills | Status: AC

## 2020-12-03 NOTE — Progress Notes (Signed)
   12/03/2020 2:16 PM   [name] [name] [birth year] [id number]  Reason for visit: Follow up OAB, recurrent UTIs  HPI: 75 year old frail and comorbid female with the above issues.  Work-up for recurrent UTIs with showed no abnormalities with a normal renal ultrasound, and most recent urinalysis and culture on 11/05/2020 was negative.  At that point we started Myrbetriq 50 mg daily for her OAB symptoms with significant frequency, urgency, and urge incontinence day and night.  She has noted significant improvement on Myrbetriq and is very happy.  Her urge incontinence is most completely resolved.  We also have her on topical estrogen cream with her history of recurrent UTIs.  She previously has done well on Macrobid prophylaxis in the past, it may be an option again in the future if she develops recurrent breakthrough infections despite the topical estrogen cream.  She is not a good candidate for anticholinergics with her age and frailty.  Myrbetriq 50 mg daily, savings coupon provided Continue topical estrogen cream RTC 1 year symptom check   Sondra Come, MD  [facility] [address] [place], [place] [address] [phone]

## 2020-12-14 ENCOUNTER — Other Ambulatory Visit: Payer: Self-pay | Admitting: *Deleted

## 2020-12-14 MED ORDER — EZETIMIBE 10 MG PO TABS: 10.0000 mg | ORAL_TABLET | Freq: Every day | ORAL | 0 refills | Status: DC

## 2020-12-15 ENCOUNTER — Other Ambulatory Visit: Payer: Self-pay

## 2020-12-15 ENCOUNTER — Ambulatory Visit (INDEPENDENT_AMBULATORY_CARE_PROVIDER_SITE_OTHER): Payer: Medicare HMO

## 2020-12-15 DIAGNOSIS — E782 Mixed hyperlipidemia: Secondary | ICD-10-CM | POA: Diagnosis not present

## 2020-12-15 DIAGNOSIS — M8000XA Age-related osteoporosis with current pathological fracture, unspecified site, initial encounter for fracture: Secondary | ICD-10-CM

## 2020-12-15 DIAGNOSIS — J449 Chronic obstructive pulmonary disease, unspecified: Secondary | ICD-10-CM | POA: Diagnosis not present

## 2020-12-15 DIAGNOSIS — I10 Essential (primary) hypertension: Secondary | ICD-10-CM | POA: Diagnosis not present

## 2020-12-15 DIAGNOSIS — R269 Unspecified abnormalities of gait and mobility: Secondary | ICD-10-CM | POA: Diagnosis not present

## 2020-12-15 NOTE — Progress Notes (Signed)
Chronic Care Management Pharmacy Note  12/15/2020 Name:  [name] [name] MRN:  [id number] DOB:  06-15-1946  Subjective: [name] [name] is an 75 y.o. year old female who is a primary patient of Ria Bush, MD.  The CCM team was consulted for assistance with disease management and care coordination needs.    Engaged with patient by telephone for follow up visit in response to provider referral for pharmacy case management and/or care coordination services.   Consent to Services:  The patient was given information about Chronic Care Management services, agreed to services, and gave verbal consent prior to initiation of services.  Please see initial visit note for detailed documentation.   Patient Care Team: Ria Bush, MD as PCP - General (Family Medicine) Rockey Situ Kathlene November, MD as PCP - Cardiology (Cardiology) Debbora Dus, [facility] as Pharmacist (Pharmacist)  Recent office visits: 08/12/20 - Danise Mina - Continue current medications, RTC 6 months  Recent consult visits: 12/03/20 - Urology - Urge incontinence is most completely resolved.  We also have her on topical estrogen cream with her history of recurrent UTIs.  Continue Myrbetriq 50 mg daily with coupon. RTC 1 year.  11/05/20 - Urology - OAB - Started 6 months UTI prophylaxis August 2021 nitrofurantoin 50 mg daily. She stopped this last week unclear reason. Macrobid 100 mg twice daily x5 days for suspected UTI, call with UA and culture results. Start topical estrogen cream for history of recurrent UTIs. Trial of Myrbetriq 50 mg daily for OAB. 09/11/20 - Cardiology - She remains on statin and Zetia therapy with an LDL of sixty-three in November.  She is also made significant dietary adjustments. Off antihypertensives, stable. CHpEF, stable, no recent furosemide use. PAD/CAD stable on aspirin, Plavix, statin. Prior stroke, no new symptoms, COPD, oxygen PRN, no wheezing on exam, RTC 6 months.   Hospital visits: None in  previous 6 months  Objective:  Lab Results  Component Value Date   CREATININE 1.10 08/05/2020   BUN 26 (H) 08/05/2020   GFR 49.72 (L) 08/05/2020   GFRNONAA >60 06/26/2020   GFRAA >60 06/26/2020   NA 138 08/05/2020   K 4.8 08/05/2020   CALCIUM 9.5 08/05/2020   CO2 31 08/05/2020   GLUCOSE 91 08/05/2020    Lab Results  Component Value Date/Time   HGBA1C 5.1 07/31/2018 02:17 AM   HGBA1C  09/30/2008 10:35 PM    5.6 (NOTE)   The ADA recommends the following therapeutic goal for glycemic   control related to Hgb A1C measurement:   Goal of Therapy:   < 7.0% Hgb A1C   Reference: American Diabetes Association: Clinical Practice   Recommendations 2008, Diabetes Care,  2008, 31:(Suppl 1).   GFR 49.72 (L) 08/05/2020 11:08 AM   GFR 49.69 (L) 01/07/2020 01:22 PM   MICROALBUR 2.3 (H) 08/05/2020 11:08 AM   MICROALBUR 1.8 01/25/2016 08:23 AM    Lab Results  Component Value Date   CHOL 147 08/05/2020   HDL 67.80 08/05/2020   LDLCALC 63 08/05/2020   LDLDIRECT 84.0 09/11/2017   TRIG 80.0 08/05/2020   CHOLHDL 2 08/05/2020    Hepatic Function Latest Ref Rng & Units 08/05/2020 01/07/2020 12/23/2019  Total Protein 6.0 - 8.3 g/dL 7.4 - 6.0(L)  Albumin 3.5 - 5.2 g/dL 4.4 4.1 2.9(L)  AST 0 - 37 U/L 25 - 25  ALT 0 - 35 U/L 20 - 18  Alk Phosphatase 39 - 117 U/L 89 - 73  Total Bilirubin 0.2 - 1.2 mg/dL 0.5 -  0.6  Bilirubin, Direct 0.0 - 0.2 mg/dL - - -    Lab Results  Component Value Date/Time   TSH 5.19 (H) 08/05/2020 11:08 AM   TSH 4.188 02/14/2020 11:26 PM   TSH 2.660 12/22/2019 12:43 PM   TSH 2.30 05/06/2019 03:22 PM   FREET4 0.99 08/06/2020 10:29 AM   FREET4 0.91 06/27/2018 10:42 AM    CBC Latest Ref Rng & Units 08/05/2020 06/26/2020 02/20/2020  WBC 4.0 - 10.5 K/uL 7.5 6.4 9.5  Hemoglobin 12.0 - 15.0 g/dL 12.9 11.4(L) 11.9(L)  Hematocrit 36.0 - 46.0 % 38.7 38.9 37.4  Platelets 150.0 - 400.0 K/uL 177.0 137(L) 198    Lab Results  Component Value Date/Time   VD25OH 61.68  08/05/2020 11:08 AM   VD25OH 37.50 07/01/2019 09:54 AM    Clinical ASCVD: Yes  The 10-year ASCVD risk score Mikey Bussing DC Jr., et al., 2013) is: 28.3%   Values used to calculate the score:     Age: 83 years     Sex: Female     Is Non-Hispanic African American: No     Diabetic: No     Tobacco smoker: No     Systolic Blood Pressure: 741 mmHg     Is BP treated: Yes     HDL Cholesterol: 67.8 mg/dL     Total Cholesterol: 147 mg/dL    Depression screen [facility] 2/9 08/12/2020 12/31/2019 07/08/2019  Decreased Interest 0 0 0  Down, Depressed, Hopeless 0 0 0  PHQ - 2 Score 0 0 0  Altered sleeping 2 - 3  Tired, decreased energy 3 - 1  Change in appetite 0 - 0  Feeling bad or failure about yourself  0 - 0  Trouble concentrating 1 - 0  Moving slowly or fidgety/restless 0 - 0  Suicidal thoughts 0 - 0  PHQ-9 Score 6 - 4  Difficult doing work/chores - - -  Some recent data might be hidden    Social History   Tobacco Use  Smoking Status Former Smoker  . Packs/day: 2.00  . Years: 46.00  . Pack years: 92.00  . Types: Cigarettes  . Quit date: 11/08/2012  . Years since quitting: 8.1  Smokeless Tobacco Never Used   BP Readings from Last 3 Encounters:  11/05/20 (!) 164/80  09/11/20 128/74  08/12/20 116/80   Pulse Readings from Last 3 Encounters:  11/05/20 91  09/11/20 84  08/12/20 98   Wt Readings from Last 3 Encounters:  12/03/20 173 lb (78.5 kg)  11/05/20 173 lb (78.5 kg)  09/11/20 177 lb (80.3 kg)   BMI Readings from Last 3 Encounters:  12/03/20 34.94 kg/m  11/05/20 36.16 kg/m  09/11/20 36.99 kg/m    Assessment/Interventions: Review of patient past medical history, allergies, medications, health status, including review of consultants reports, laboratory and other test data, was performed as part of comprehensive evaluation and provision of chronic care management services.   SDOH:  (Social Determinants of Health) assessments and interventions performed: Yes SDOH Interventions    Flowsheet Row Most Recent Value  SDOH Interventions   Financial Strain Interventions Intervention Not Indicated  [Meds affordable]      CCM Care Plan  Allergies  Allergen Reactions  . Doxycycline Nausea Only and Other (See Comments)    Sig GI upset Other reaction(s): GI Symptoms  . Oxycodone Other (See Comments)    Overly sedating, hypotension, decreased oxgyen  . Fluticasone-Salmeterol Anxiety    Medications Reviewed Today    Reviewed by Debbora Dus, [place] (  Pharmacist) on 12/15/20 at 1048  Med List Status: <None>  Medication Order Taking? Sig Documenting Provider Last Dose Status Informant  acetaminophen (TYLENOL) 500 MG tablet [id number] Yes Take 1 tablet (500 mg total) by mouth every 6 (six) hours as needed for headache. Ria Bush, MD Taking Active   albuterol (VENTOLIN HFA) [phone] Base) MCG/ACT inhaler [id number] Yes INHALE 2 PUFFS INTO THE LUNGS EVERY 6 (SIX) HOURS AS NEEDED FOR WHEEZING OR SHORTNESS OF BREATH. Ria Bush, MD Taking Active Family Member  alendronate (FOSAMAX) 70 MG tablet [id number] Yes TAKE 1 TABLET WEEKLY. TAKE WITH A FULL GLASS OF WATER ON AN EMPTY STOMACH. Ria Bush, MD Taking Active   ascorbic acid (VITAMIN C) 250 MG CHEW [id number] Yes Chew 500 mg by mouth daily. [provider] Taking Active Family Member  aspirin (ASPIRIN 81) 81 MG EC tablet [id number] Yes Take 1 tablet (81 mg total) by mouth daily. Swallow whole. Ria Bush, MD Taking Active Family Member  atorvastatin (LIPITOR) 40 MG tablet [id number] Yes Take 1 tablet (40 mg total) by mouth daily. Ria Bush, MD Taking Active   calcium carbonate (TUMS - DOSED IN MG ELEMENTAL CALCIUM) 500 MG chewable tablet [id number] Yes Chew 1 tablet by mouth daily as needed for indigestion or heartburn. [provider] Taking Active Family Member  cetirizine (ZYRTEC) 10 MG tablet [id number] Yes Take 10 mg by mouth daily. [provider] Taking Active Family  Member  Cholecalciferol (VITAMIN D3) 125 MCG (5000 UT) CAPS [id number] Yes Take 5,000 Units by mouth daily. Gummies [provider] Taking Active Family Member  clopidogrel (PLAVIX) 75 MG tablet [id number] Yes Take 1 tablet (75 mg total) by mouth daily. Ria Bush, MD Taking Active   Cranberry-Vitamin C (AZO CRANBERRY URINARY TRACT PO) [id number] Yes Take 1 tablet by mouth in the morning and at bedtime.  [provider] Taking Active Family Member  estradiol (ESTRACE) 0.1 MG/GM vaginal cream [id number] Yes Estrogen Cream Instruction Discard applicator Apply pea sized amount to tip of finger to urethra before bed. Wash hands well after application. Use Monday, Wednesday and Friday Billey Co, MD Taking Active   ezetimibe (ZETIA) 10 MG tablet [id number] Yes Take 1 tablet (10 mg total) by mouth daily. Minna Merritts, MD Taking Active   furosemide (LASIX) 40 MG tablet [id number] Yes Take 20 mg by mouth daily as needed for fluid. Weight every morning before given Ria Bush, MD Taking Active Family Member           Med Note Despina Hidden   Thu Apr 30, 2020  1:27 PM)    gabapentin (NEURONTIN) 100 MG capsule [id number] Yes Take 1 capsule (100 mg total) by mouth at bedtime. Ria Bush, MD Taking Active   levothyroxine (SYNTHROID) 50 MCG tablet [id number] Yes Take 1 tablet (50 mcg total) by mouth daily before breakfast. Ria Bush, MD Taking Active   Melatonin 10 MG TABS [id number] Yes Take 20 mg by mouth at bedtime. Gummies [provider] Taking Active Family Member  mirabegron ER (MYRBETRIQ) 50 MG TB24 tablet [id number] Yes Take 1 tablet (50 mg total) by mouth daily. Billey Co, MD Taking Active   Multiple Vitamins-Minerals (MULTIVITAMIN PO) [id number] Yes Take 1 tablet by mouth daily. Gummie [provider] Taking Active Family Member  OXYGEN [id number] Yes Inhale 2 L/min into the lungs See admin instructions. Inhale 2 L/min of oxygen  at bedtime and during the day as needed for shortness of breath [provider] Taking  Active Family Member  pantoprazole (PROTONIX) 40 MG tablet [id number] Yes TAKE 1 TABLET EVERY DAY AS NEEDED Ria Bush, MD Taking Active   Potassium Chloride ER 20 MEQ TBCR [id number] Yes TAKE 2 TABLETS BY MOUTH AS NEEDED (WHENEVER  YOU  TAKE  LASIX)  Patient taking differently: TAKE 2 TABLETS BY MOUTH AS NEEDED (WHENEVER  YOU  TAKE  LASIX)   Ria Bush, MD Taking Active            Med Note Despina Hidden   Thu Apr 30, 2020  1:27 PM)    sertraline (ZOLOFT) 100 MG tablet [id number] Yes Take 1 tablet (100 mg total) by mouth daily. Ria Bush, MD Taking Active   traMADol Veatrice Bourbon) 50 MG tablet [id number] Yes Take 1 tablet (50 mg total) by mouth 2 (two) times daily as needed for moderate pain. Ria Bush, MD Taking Active   traZODone (DESYREL) 100 MG tablet [id number] Yes Take 1 tablet (100 mg total) by mouth at bedtime. Ria Bush, MD Taking Active   zinc gluconate 50 MG tablet [id number] Yes Take 50 mg by mouth daily. [provider] Taking Active Family Member          Patient Active Problem List   Diagnosis Date Noted  . History of recurrent UTIs 02/18/2020  . Closed T10 fracture ([place]) 01/29/2020  . Acute midline back pain 12/21/2019  . Neuropathy 05/06/2019  . CKD (chronic kidney disease) stage 3, GFR 30-59 ml/min (HCC) 01/24/2019  . Tinnitus of both ears 11/24/2018  . Vision loss of left eye 07/31/2018  . Thrombocytopenia ([place]) 07/31/2018  . Tremor 07/03/2018  . Chronic diastolic heart failure ([place]) 01/30/2018  . Memory loss 01/30/2018  . Peripheral edema 01/30/2018  . Intertrigo 01/30/2018  . Chronic back pain 11/21/2017  . Chronic respiratory failure with hypoxia ([place]) 11/14/2017  . Shortness of breath 11/14/2017  . Closed compression fracture of fourth lumbar vertebra ([place]) 10/02/2017  . Compression fracture of body of thoracic vertebra ([place])  09/30/2017  . Hypothyroidism (acquired) 06/28/2017  . Vitamin D deficiency 06/25/2017  . Advanced care planning/counseling discussion 01/29/2015  . Encounter for general adult medical examination with abnormal findings 01/29/2015  . Carotid stenosis 04/03/2014  . Carotid artery aneurysm ([place]) 04/03/2014  . Recurrent falls 01/02/2014  . Hemiparesis affecting right side as late effect of stroke ([place]) 01/02/2014  . Insomnia 01/10/2013  . Medicare annual wellness visit, subsequent 11/26/2012  . Hypertension   . History of lacunar cerebrovascular accident (CVA)   . Peripheral vascular disease ([place])   . COPD (chronic obstructive pulmonary disease) ([place])   . Hyperlipidemia   . Osteoarthritis   . MDD (major depressive disorder), recurrent episode, moderate ([place])   . Ex-smoker   . Osteoporosis 11/25/2010    Immunization History  Administered Date(s) Administered  . Fluad Quad(high Dose 65+) 07/08/2019, 08/12/2020  . Influenza,inj,Quad PF,6+ Mos 07/04/2013, 08/03/2015, 06/26/2017, 06/27/2018  . PFIZER(Purple Top)SARS-COV-2 Vaccination 11/22/2019, 12/17/2019  . Pneumococcal Conjugate-13 01/02/2014  . Pneumococcal Polysaccharide-23 11/26/2012  . Td 09/26/2004  . Tdap 08/06/2015    Conditions to be addressed/monitored:  Hypertension, Hyperlipidemia and Osteoporosis  Care Plan : [place]  Updates made by Debbora Dus, [place] since 12/15/2020 12:00 AM    Problem: CHL AMB "PATIENT-SPECIFIC PROBLEM"     Long-Range Goal: Disease Management   Start Date: 12/15/2020  Priority: High  Note:    Current Barriers:  . None identified  Pharmacist Clinical Goal(s):  [name] Patient will contact provider office  for questions/concerns as evidenced notation of same in electronic health record through collaboration with PharmD and provider.   Interventions: . 1:1 collaboration with Ria Bush, MD regarding development and update of comprehensive plan of care as evidenced by provider  attestation and co-signature . Inter-disciplinary care team collaboration (see longitudinal plan of care) . Comprehensive medication review performed; medication list updated in electronic medical record  Hypertension (BP goal <140/90) -Controlled -Current treatment: . No pharmacotherapy -Medications previously tried: Enalapril - dizziness -Current home readings: none recent - checks occasionally by daughter Usually around 130s/70s  -Current dietary habits: Optavia diet  -Denies hypotensive/hypertensive symptoms -Educated on BP goals and benefits of medications for prevention of heart attack, stroke and kidney damage; Symptoms of hypotension and importance of maintaining adequate hydration;  History of dizziness - denies any falls in past month, using walker around house, has life alert  -Counseled to monitor BP at home monthly, document, and provide log at future appointments -Recommended continue lifestyle management  Hyperlipidemia: (LDL goal < 70) -Controlled -Current treatment: . Atorvastatin 40 mg - 1 tablet daily . Zetia 10 mg - 1 tablet daily at bedtime  -Medications previously tried: none -Educated on Adherence  -Recommended to continue current medication  Osteopenia (Goal : Improve bone density) -Controlled -Last DEXA Scan: 06/2017 - T score -2.2 spine, -2.4 hip (improved from 2012) -Current treatment  . Fosamax 70 mg - 1 tablet weekly . Vitamin D3 5000 IU - 1 tablet daily   -Medications previously tried:  Prolia cost prohibitive -Counseled on oral bisphosphonate administration: take in the morning, 30 minutes prior to food with 6-8 oz of water. Do not lie down for at least 30 minutes after taking. -Recommended to continue current medication  Other: Rarely needs Tums or PPI No furosemide since starting [place]. She stays hydrated now. She weighs daily. Down to 171 lbs. OTCs - Tylenol, no NSAIDs All other meds on list confirmed.  Patient Goals/Self-Care  Activities . Patient will:  - take medications as prescribed check blood pressure monthly or with symptoms , document, and provide at future appointments - take Fosamax 1 hour before food or other meds      Follow Up Plan: Telephone follow up appointment with care management team member scheduled for: 06/17/21 9:30 AM  Medication Assistance: None required.  Patient affirms current coverage meets needs.  Patient's preferred pharmacy is:  [facility] [address] ([address]), [place] - [address] [place] [address] Phone: [phone] Fax: [phone]  [place], [place] [address] [place] [address] Phone: [phone] Fax: [facility] [place], [place] [address] Phone: [phone] Fax: [phone]  Uses pill box? Yes Pt endorses 100% compliance - daughter, Chong Sicilian, fills pillbox  Uses [employer] primarily, [facility] for acute meds. All meds are in morning except trazodone and Zetia at night.  We discussed: Current pharmacy is preferred with insurance plan and patient is satisfied with pharmacy services Patient decided to: Continue current medication management strategy  Care Plan and Follow Up Patient Decision:  Patient agrees to Care Plan and Follow-up.  Debbora Dus, PharmD Clinical Pharmacist [facility] [phone]

## 2020-12-15 NOTE — Patient Instructions (Addendum)
Dear [name] [name],  Below is a summary of the goals we discussed during our follow up appointment on December 15, 2020. Please contact me anytime with questions or concerns.   Visit Information  Patient Care Plan: CCM Pharmacy Care Plan    Problem Identified: CHL AMB "PATIENT-SPECIFIC PROBLEM"     Long-Range Goal: Disease Management   Start Date: 12/15/2020  Priority: High  Note:    Current Barriers:  . None identified  Pharmacist Clinical Goal(s):  [name] Patient will contact provider office for questions/concerns as evidenced notation of same in electronic health record through collaboration with PharmD and provider.   Interventions: . 1:1 collaboration with [name], MD regarding development and update of comprehensive plan of care as evidenced by provider attestation and co-signature . Inter-disciplinary care team collaboration (see longitudinal plan of care) . Comprehensive medication review performed; medication list updated in electronic medical record  Hypertension (BP goal <140/90) -Controlled -Current treatment: . No pharmacotherapy -Medications previously tried: Enalapril - dizziness -Current home readings: none recent - checks occasionally by daughter Usually around 130s/70s  -Current dietary habits: Optavia diet  -Denies hypotensive/hypertensive symptoms -Educated on BP goals and benefits of medications for prevention of heart attack, stroke and kidney damage; Symptoms of hypotension and importance of maintaining adequate hydration;  History of dizziness - denies any falls in past month, using walker around house, has life alert  -Counseled to monitor BP at home monthly, document, and provide log at future appointments -Recommended continue lifestyle management  Hyperlipidemia: (LDL goal < 70) -Controlled -Current treatment: . Atorvastatin 40 mg - 1 tablet daily . Zetia 10 mg - 1 tablet daily at bedtime  -Medications previously tried: none -Educated on  Adherence  -Recommended to continue current medication  Osteopenia (Goal : Improve bone density) -Controlled -Last DEXA Scan: 06/2017 - T score -2.2 spine, -2.4 hip (improved from 2012) -Current treatment  . Fosamax 70 mg - 1 tablet weekly . Vitamin D3 5000 IU - 1 tablet daily   -Medications previously tried:  Prolia cost prohibitive -Counseled on oral bisphosphonate administration: take in the morning, 30 minutes prior to food with 6-8 oz of water. Do not lie down for at least 30 minutes after taking. -Recommended to continue current medication  Other: Rarely needs Tums or PPI No furosemide since starting Optavia diet. She stays hydrated now. She weighs daily. Down to 171 lbs. OTCs - Tylenol, no NSAIDs All other meds on list confirmed.  Patient Goals/Self-Care Activities . Patient will:  - take medications as prescribed check blood pressure monthly or with symptoms , document, and provide at future appointments - take Fosamax 1 hour before food or other meds       The patient verbalized understanding of instructions, educational materials, and care plan provided today and agreed to receive a mailed copy of patient instructions, educational materials, and care plan.  Telephone follow up appointment with pharmacy team member scheduled for: 06/17/21 9:30 AM  Phil Dopp, RPH   Osteopenia  Osteopenia is a loss of thickness (density) inside the bones. Another name for osteopenia is low bone mass. Mild osteopenia is a normal part of aging. It is not a disease, and it does not cause symptoms. However, if you have osteopenia and continue to lose bone mass, you could develop a condition that causes the bones to become thin and break more easily (osteoporosis). Osteoporosis can cause you to lose some height, have back pain, and have a stooped posture. Although osteopenia is not a  disease, making changes to your lifestyle and diet can help to prevent osteopenia from developing into  osteoporosis. What are the causes? Osteopenia is caused by loss of calcium in the bones. Bones are constantly changing. Old bone cells are continually being replaced with new bone cells. This process builds new bone. The mineral calcium is needed to build new bone and maintain bone density. Bone density is usually highest around age 78. After that, most people's bodies cannot replace all the bone they have lost with new bone. What increases the risk? You are more likely to develop this condition if:  You are older than age 22.  You are a woman who went through menopause early.  You have a long illness that keeps you in bed.  You do not get enough exercise.  You lack certain nutrients (malnutrition).  You have an overactive thyroid gland (hyperthyroidism).  You use products that contain nicotine or tobacco, such as cigarettes, e-cigarettes and chewing tobacco, or you drink a lot of alcohol.  You are taking medicines that weaken the bones, such as steroids. What are the signs or symptoms? This condition does not cause any symptoms. You may have a slightly higher risk for bone breaks (fractures), so getting fractures more easily than normal may be an indication of osteopenia. How is this diagnosed? This condition may be diagnosed based on an X-ray exam that measures bone density (dual-energy X-ray absorptiometry, or DEXA). This test can measure bone density in your hips, spine, and wrists. Osteopenia has no symptoms, so this condition is usually diagnosed after a routine bone density screening test is done for osteoporosis. This routine screening is usually done for:  Women who are age 69 or older.  Men who are age 77 or older. If you have risk factors for osteopenia, you may have the screening test at an earlier age. How is this treated? Making dietary and lifestyle changes can lower your risk for osteoporosis. If you have severe osteopenia that is close to becoming osteoporosis, this  condition can be treated with medicines and dietary supplements such as calcium and vitamin D. These supplements help to rebuild bone density. Follow these instructions at home: Eating and drinking Eat a diet that is high in calcium and vitamin D.  Calcium is found in dairy products, beans, salmon, and leafy green vegetables like spinach and broccoli.  Look for foods that have vitamin D and calcium added to them (fortified foods), such as orange juice, cereal, and bread.   Lifestyle  Do 30 minutes or more of a weight-bearing exercise every day, such as walking, jogging, or playing a sport. These types of exercises strengthen the bones.  Do not use any products that contain nicotine or tobacco, such as cigarettes, e-cigarettes, and chewing tobacco. If you need help quitting, ask your health care provider.  Do not drink alcohol if: ? Your health care provider tells you not to drink. ? You are pregnant, may be pregnant, or are planning to become pregnant.  If you drink alcohol: ? Limit how much you use to:  0-1 drink a day for women.  0-2 drinks a day for men. ? Be aware of how much alcohol is in your drink. In the [place] one drink equals one 12 oz bottle of beer (355 mL), one 5 oz glass of wine (148 mL), or one 1 oz glass of hard liquor (44 mL). General instructions  Take over-the-counter and prescription medicines only as told by your health care provider. These  include vitamins and supplements.  Take precautions at home to lower your risk of falling, such as: ? Keeping rooms well-lit and free of clutter, such as cords. ? Installing safety rails on stairs. ? Using rubber mats in the bathroom or other areas that are often wet or slippery.  Keep all follow-up visits. This is important. Contact a health care provider if:  You have not had a bone density screening for osteoporosis and you are: ? A woman who is age 21 or older. ? A man who is age 9 or older.  You are a  postmenopausal woman who has not had a bone density screening for osteoporosis.  You are older than age 16 and you want to know if you should have bone density screening for osteoporosis. Summary  Osteopenia is a loss of thickness (density) inside the bones. Another name for osteopenia is low bone mass.  Osteopenia is not a disease, but it may increase your risk for a condition that causes the bones to become thin and break more easily (osteoporosis).  You may be at risk for osteopenia if you are older than age 65 or if you are a woman who went through early menopause.  Osteopenia does not cause any symptoms, but it can be diagnosed with a bone density screening test.  Dietary and lifestyle changes are the first treatment for osteopenia. These may lower your risk for osteoporosis. This information is not intended to replace advice given to you by your health care provider. Make sure you discuss any questions you have with your health care provider. Document Revised: 02/27/2020 Document Reviewed: 02/27/2020 Elsevier Patient Education  2021 [employer].

## 2020-12-16 ENCOUNTER — Other Ambulatory Visit: Payer: Self-pay

## 2020-12-16 ENCOUNTER — Other Ambulatory Visit: Payer: Self-pay | Admitting: *Deleted

## 2020-12-16 NOTE — Telephone Encounter (Signed)
Received several refill request from [facility] for patient. Epic shows that several of the refills had already been sent to them. Spoke to [place] at [place] and was advised that they never received any of the refills. Gabapentin sent to to [facility] 09/09/20 #90/3 Plavix sent to [facility] 09/09/20 #90/3 Trazodone sent to [facility] 09/09/20 #90/3 Tramadol last refill sent to [facility] 08/12/20 #20 Sertraline sent to [facility] 08/12/20 #90/3 Last office visit 08/12/20 Per [place] the Estrace cream was sent to her urologist since he ordered the medication and Zetia was sent to her cardiologist

## 2020-12-16 NOTE — Telephone Encounter (Signed)
Pharmacy requests refill on: Tramadol 50 mg   LAST REFILL: 08/12/2020 (Q-20, R-0) LAST OV: 08/12/2020 NEXT OV: 02/09/2021 PHARMACY: [facility]

## 2020-12-16 NOTE — Progress Notes (Signed)
Encounter details: CCM Time Spent       Value Time User   Time spent with patient (minutes)  30 12/15/2020 11:12 AM Phil Dopp, RPH   Time spent performing Chart review  30 12/15/2020 11:12 AM Phil Dopp, [facility]   Total time (minutes)  60 12/15/2020 11:12 AM Phil Dopp, RPH      Moderate to High Complex Decision Making       Value Time User   Moderate to High complex decision making  Yes 12/15/2020 11:12 AM Phil Dopp, [facility]      CCM Services: This encounter meets complex CCM services and moderate to high decision making.  Prior to outreach and patient consent for Chronic Care Management, I referred this patient for services after reviewing the nominated patient list or from a personal encounter with the patient.  I have personally reviewed this encounter including the documentation in this note and have collaborated with the care management provider regarding care management and care coordination activities to include development and update of the comprehensive care plan. I am certifying that I agree with the content of this note and encounter as supervising physician.

## 2020-12-17 MED ORDER — CLOPIDOGREL BISULFATE 75 MG PO TABS
75.0000 mg | ORAL_TABLET | Freq: Every day | ORAL | 2 refills | Status: DC
Start: 2020-12-17 — End: 2021-06-21

## 2020-12-17 MED ORDER — GABAPENTIN 100 MG PO CAPS
100.0000 mg | ORAL_CAPSULE | Freq: Every day | ORAL | 2 refills | Status: DC
Start: 2020-12-17 — End: 2021-06-21

## 2020-12-17 MED ORDER — TRAMADOL HCL 50 MG PO TABS: 50.0000 mg | ORAL_TABLET | Freq: Two times a day (BID) | ORAL | 0 refills | Status: DC | PRN

## 2020-12-17 MED ORDER — SERTRALINE HCL 100 MG PO TABS
100.0000 mg | ORAL_TABLET | Freq: Every day | ORAL | 2 refills | Status: DC
Start: 2020-12-17 — End: 2021-09-28

## 2020-12-17 MED ORDER — TRAZODONE HCL 100 MG PO TABS
100.0000 mg | ORAL_TABLET | Freq: Every day | ORAL | 2 refills | Status: DC
Start: 2020-12-17 — End: 2021-06-02

## 2020-12-17 NOTE — Telephone Encounter (Signed)
ERx 

## 2020-12-18 ENCOUNTER — Telehealth: Payer: Self-pay

## 2020-12-18 NOTE — Telephone Encounter (Signed)
Patients daughter [name] called and LVM on triage line requesting an order be placed for patient to get a portable oxygen tank. Patients daughter states that the one she has now is to big and heavy and is requesting one that she can carry over her shoulder.

## 2020-12-18 NOTE — Telephone Encounter (Signed)
Script written and in Lisa's box.

## 2020-12-21 NOTE — Telephone Encounter (Signed)
Patient returned your call. EM 

## 2020-12-21 NOTE — Telephone Encounter (Signed)
Patient's daughter notified that script is up front ready for pickup. [name] stated that her mom has a tank that is too heavy for her to carry. [name] was advised that she should be able to give the new script to the current provider for them to take care of it.

## 2020-12-21 NOTE — Telephone Encounter (Signed)
Left a message on voicemail for [name] [name] to call the office back.

## 2020-12-28 NOTE — Telephone Encounter (Signed)
Lvm asking pt's daughter, Tonya (on dpr), to call back.  Need to find out if she pick up printed rx for portable O2 tank.  

## 2020-12-28 NOTE — Telephone Encounter (Signed)
Patients Daughter called back in stating that she is need of [place] faxing [facility] the script for the small concentrator for her oxygen tank. They have not received it and they are asking [place] to refax the order to [phone]. EM

## 2020-12-29 NOTE — Telephone Encounter (Signed)
Lvm asking pt's daughter, [name] (on dpr), to call back.  Need to find out if she pick up printed rx for portable O2 tank.

## 2020-12-30 NOTE — Telephone Encounter (Signed)
Faxed rx to [facility] at [phone].

## 2020-12-30 NOTE — Telephone Encounter (Signed)
New Rx written and in Lisa's box.  °

## 2020-12-30 NOTE — Telephone Encounter (Signed)
Spoke to patient's daughter and was advised that she had picked the script up for the oxygen tank. [name] stated that she called back the other day stating that [facility] would not accept the script from her. [name] stated that she had requested that the script be rewritten and faxed to [facility] and she had left the fax number when she left the message the other day.

## 2021-01-13 ENCOUNTER — Other Ambulatory Visit: Payer: Self-pay

## 2021-01-13 NOTE — Telephone Encounter (Signed)
Pharmacy requests refill on: Alendronate 70 mg   LAST REFILL: 08/05/2020 (Q-12, R-0) LAST OV: 08/12/2020 NEXT OV: 02/09/2021 PHARMACY: [facility]

## 2021-01-15 ENCOUNTER — Other Ambulatory Visit: Payer: Self-pay

## 2021-01-15 DIAGNOSIS — J449 Chronic obstructive pulmonary disease, unspecified: Secondary | ICD-10-CM | POA: Diagnosis not present

## 2021-01-15 DIAGNOSIS — R269 Unspecified abnormalities of gait and mobility: Secondary | ICD-10-CM | POA: Diagnosis not present

## 2021-01-15 NOTE — Telephone Encounter (Signed)
Pharmacy requests refill on: Alendronate 70 mg   LAST REFILL: 08/05/2020 (Q-12, R-0) LAST OV: 08/12/2020 NEXT OV: 02/09/2021 PHARMACY: [facility]   Pharmacy requests refill on: Atorvastatin 40 mg   LAST REFILL: 09/09/2020 (Q-90, R-3) LAST OV: 08/12/2020 NEXT OV: 02/09/2021 PHARMACY: [facility]

## 2021-01-17 MED ORDER — ALENDRONATE SODIUM 70 MG PO TABS: ORAL_TABLET | ORAL | 0 refills | Status: DC

## 2021-01-20 ENCOUNTER — Other Ambulatory Visit: Payer: Self-pay

## 2021-01-20 MED ORDER — ALENDRONATE SODIUM 70 MG PO TABS: ORAL_TABLET | ORAL | 2 refills | Status: DC

## 2021-01-20 NOTE — Telephone Encounter (Signed)
E-scribed refill to [facility].  

## 2021-01-20 NOTE — Telephone Encounter (Signed)
[place] with [facility] pharmacy left v/m requesting status of refill for alendronate and atorvastatin.

## 2021-02-01 NOTE — Telephone Encounter (Addendum)
[name] [name] (DPR signed) left v/m that Dr Reece Agar had written an rx for over shoulder portable oxygen tank; they have not been able to find local durable medical equipment  And so requesting a new rx to take to a different medical equipment co for the over the shoulder portable oxygen tank. Tonya request cb.

## 2021-02-09 ENCOUNTER — Encounter: Payer: Self-pay | Admitting: Family Medicine

## 2021-02-09 ENCOUNTER — Other Ambulatory Visit: Payer: Self-pay

## 2021-02-09 ENCOUNTER — Ambulatory Visit (INDEPENDENT_AMBULATORY_CARE_PROVIDER_SITE_OTHER): Payer: Medicare HMO | Admitting: Family Medicine

## 2021-02-09 DIAGNOSIS — I10 Essential (primary) hypertension: Secondary | ICD-10-CM

## 2021-02-09 DIAGNOSIS — I5032 Chronic diastolic (congestive) heart failure: Secondary | ICD-10-CM

## 2021-02-09 DIAGNOSIS — J9611 Chronic respiratory failure with hypoxia: Secondary | ICD-10-CM | POA: Diagnosis not present

## 2021-02-09 DIAGNOSIS — Z8673 Personal history of transient ischemic attack (TIA), and cerebral infarction without residual deficits: Secondary | ICD-10-CM

## 2021-02-09 DIAGNOSIS — J432 Centrilobular emphysema: Secondary | ICD-10-CM | POA: Diagnosis not present

## 2021-02-09 MED ORDER — ATORVASTATIN CALCIUM 40 MG PO TABS: 40.0000 mg | ORAL_TABLET | Freq: Every day | ORAL | 3 refills | Status: DC

## 2021-02-09 NOTE — Assessment & Plan Note (Addendum)
Stable period on PRN albuterol. Reviewed avoiding heat/humidity as much as able during upcoming summer months.

## 2021-02-09 NOTE — Assessment & Plan Note (Signed)
Chronic, stable only on PRN lasix.

## 2021-02-09 NOTE — Assessment & Plan Note (Addendum)
Sees IR Deveshwar. Continue DAPT with atorvastatin

## 2021-02-09 NOTE — Assessment & Plan Note (Signed)
Seems euvolemic on PRN lasix.

## 2021-02-09 NOTE — Progress Notes (Signed)
Patient ID: [name] [name], female    DOB: 1946/04/22, 75 y.o.   MRN: [id number]  This visit was conducted in person.  BP 120/80   Pulse 82   Temp 98.7 F (37.1 C) (Temporal)   Ht 4\' 11"  (1.499 m)   Wt 175 lb 6 oz (79.5 kg)   SpO2 96%   BMI 35.42 kg/m    CC: 6 mo f/u visit  Subjective:   HPI: [name] [name] is a 75 y.o. female presenting on 02/09/2021 for Follow-up (Here for 6 mo f/u.  Pt accompanied by daughter, [name]- temp 97.7.)   Seeing urology 02/11/2021) for overactive bladder, on myrbetriq 50mg  daily with benefit. Also continues topical estrogen.   Seeing cardiology for known HFpEF on PRN lasix. Continues aspirin, plavix, statin for h/o prior stroke and known PAD/carotid stenosis.   Known COPD - continues nocturnal supplemental oxygen as well as intermittent during the day as needed with exertion. She never found portable oxygen concentrator, just uses stationary converter as well as small tank she can take with her (only lasts 2 hours).   No recent falls. No recent stroke symptoms. No chest pain/tightness. No pedal edema. Occasional dyspnea/wheeze with exertion.      Relevant past medical, surgical, family and social history reviewed and updated as indicated. Interim medical history since our last visit reviewed. Allergies and medications reviewed and updated. Outpatient Medications Prior to Visit  Medication Sig Dispense Refill  . acetaminophen (TYLENOL) 500 MG tablet Take 1 tablet (500 mg total) by mouth every 6 (six) hours as needed for headache.    . albuterol (VENTOLIN HFA) 108 (90 Base) MCG/ACT inhaler INHALE 2 PUFFS INTO THE LUNGS EVERY 6 (SIX) HOURS AS NEEDED FOR WHEEZING OR SHORTNESS OF BREATH. 18 g 0  . alendronate (FOSAMAX) 70 MG tablet Take with a full glass of water on an empty stomach. 12 tablet 2  . ascorbic acid (VITAMIN C) 250 MG CHEW Chew 500 mg by mouth daily.    Richardo Hanks aspirin (ASPIRIN 81) 81 MG EC tablet Take 1 tablet (81 mg total) by mouth  daily. Swallow whole.    . calcium carbonate (TUMS - DOSED IN MG ELEMENTAL CALCIUM) 500 MG chewable tablet Chew 1 tablet by mouth daily as needed for indigestion or heartburn.    . cetirizine (ZYRTEC) 10 MG tablet Take 10 mg by mouth daily.    . Cholecalciferol (VITAMIN D3) 125 MCG (5000 UT) CAPS Take 5,000 Units by mouth daily. Gummies    . clopidogrel (PLAVIX) 75 MG tablet Take 1 tablet (75 mg total) by mouth daily. 90 tablet 2  . Cranberry-Vitamin C (AZO CRANBERRY URINARY TRACT PO) Take 1 tablet by mouth in the morning and at bedtime.     estradiol (ESTRACE) 0.1 MG/GM vaginal cream Estrogen Cream Instruction Discard applicator Apply pea sized amount to tip of finger to urethra before bed. Wash hands well after application. Use Monday, Wednesday and Friday 42.5 g 12  . ezetimibe (ZETIA) 10 MG tablet Take 1 tablet (10 mg total) by mouth daily. 90 tablet 0  . furosemide (LASIX) 40 MG tablet Take 20 mg by mouth daily as needed for fluid. Weight every morning before given    . gabapentin (NEURONTIN) 100 MG capsule Take 1 capsule (100 mg total) by mouth at bedtime. 90 capsule 2  . levothyroxine (SYNTHROID) 50 MCG tablet Take 1 tablet (50 mcg total) by mouth daily before breakfast. 90 tablet 3  . Melatonin 10 MG TABS  Take 20 mg by mouth at bedtime. Gummies    . mirabegron ER (MYRBETRIQ) 50 MG TB24 tablet Take 1 tablet (50 mg total) by mouth daily. 30 tablet 11  . Multiple Vitamins-Minerals (MULTIVITAMIN PO) Take 1 tablet by mouth daily. Gummie    . OXYGEN Inhale 2 L/min into the lungs See admin instructions. Inhale 2 L/min of oxygen at bedtime and during the day as needed for shortness of breath    . pantoprazole (PROTONIX) 40 MG tablet TAKE 1 TABLET EVERY DAY AS NEEDED 90 tablet 3  . Potassium Chloride ER 20 MEQ TBCR TAKE 2 TABLETS BY MOUTH AS NEEDED (WHENEVER  YOU  TAKE  LASIX) (Patient taking differently: TAKE 2 TABLETS BY MOUTH AS NEEDED (WHENEVER  YOU  TAKE  LASIX)) 180 tablet 2  . sertraline  (ZOLOFT) 100 MG tablet Take 1 tablet (100 mg total) by mouth daily. 90 tablet 2  . traMADol (ULTRAM) 50 MG tablet Take 1 tablet (50 mg total) by mouth 2 (two) times daily as needed for moderate pain. 20 tablet 0  . traZODone (DESYREL) 100 MG tablet Take 1 tablet (100 mg total) by mouth at bedtime. 90 tablet 2  . zinc gluconate 50 MG tablet Take 50 mg by mouth daily.    [name] atorvastatin (LIPITOR) 40 MG tablet Take 1 tablet (40 mg total) by mouth daily. 90 tablet 3   No facility-administered medications prior to visit.     Per HPI unless specifically indicated in ROS section below Review of Systems Objective:  BP 120/80   Pulse 82   Temp 98.7 F (37.1 C) (Temporal)   Ht 4\' 11"  (1.499 m)   Wt 175 lb 6 oz (79.5 kg)   SpO2 96%   BMI 35.42 kg/m   Wt Readings from Last 3 Encounters:  02/09/21 175 lb 6 oz (79.5 kg)  12/03/20 173 lb (78.5 kg)  11/05/20 173 lb (78.5 kg)      Physical Exam Vitals and nursing note reviewed.  Constitutional:      Appearance: Normal appearance. She is obese. She is not ill-appearing.  Eyes:     Extraocular Movements: Extraocular movements intact.     Pupils: Pupils are equal, round, and reactive to light.  Cardiovascular:     Rate and Rhythm: Normal rate and regular rhythm.     Pulses: Normal pulses.     Heart sounds: Normal heart sounds. No murmur heard.   Pulmonary:     Effort: Pulmonary effort is normal. No respiratory distress.     Breath sounds: No wheezing, rhonchi or rales.     Comments: Faint crackles RLL Musculoskeletal:     Right lower leg: No edema.     Left lower leg: No edema.     Comments: 1+ DP bilaterally  Skin:    Findings: No rash.  Neurological:     Mental Status: She is alert.  Psychiatric:        Mood and Affect: Mood normal.        Behavior: Behavior normal.       Assessment & Plan:  This visit occurred during the SARS-CoV-2 public health emergency.  Safety protocols were in place, including screening questions prior  to the visit, additional usage of staff PPE, and extensive cleaning of exam room while observing appropriate contact time as indicated for disinfecting solutions.   Problem List Items Addressed This Visit    Hypertension    Chronic, stable only on PRN lasix.  Relevant Medications   atorvastatin (LIPITOR) 40 MG tablet   History of lacunar cerebrovascular accident (CVA)    Sees IR Deveshwar. Continue DAPT with atorvastatin       COPD (chronic obstructive pulmonary disease) (HCC)    Stable period on PRN albuterol. Reviewed avoiding heat/humidity as much as able during upcoming summer months.       Chronic respiratory failure with hypoxia (HCC)    Continues night time oxygen with daytime use PRN exertion.       Chronic diastolic heart failure (HCC)    Seems euvolemic on PRN lasix.       Relevant Medications   atorvastatin (LIPITOR) 40 MG tablet       Meds ordered this encounter  Medications  . atorvastatin (LIPITOR) 40 MG tablet    Sig: Take 1 tablet (40 mg total) by mouth daily.    Dispense:  90 tablet    Refill:  3   No orders of the defined types were placed in this encounter.   Patient Instructions  Return in 6 months (after 11/17) for annual exam/wellness visit. Continue current medicines You are doing well today    Follow up plan: Return in about 6 months (around 08/12/2021) for annual exam, prior fasting for blood work, medicare wellness visit.  [name], MD

## 2021-02-09 NOTE — Patient Instructions (Signed)
Return in 6 months (after 11/17) for annual exam/wellness visit. Continue current medicines You are doing well today

## 2021-02-09 NOTE — Assessment & Plan Note (Signed)
Continues night time oxygen with daytime use PRN exertion.

## 2021-02-10 ENCOUNTER — Other Ambulatory Visit (HOSPITAL_COMMUNITY): Payer: Self-pay | Admitting: Interventional Radiology

## 2021-02-10 DIAGNOSIS — I771 Stricture of artery: Secondary | ICD-10-CM

## 2021-02-14 DIAGNOSIS — R269 Unspecified abnormalities of gait and mobility: Secondary | ICD-10-CM | POA: Diagnosis not present

## 2021-02-14 DIAGNOSIS — J449 Chronic obstructive pulmonary disease, unspecified: Secondary | ICD-10-CM | POA: Diagnosis not present

## 2021-02-16 ENCOUNTER — Encounter: Payer: Self-pay | Admitting: Acute Care

## 2021-02-26 ENCOUNTER — Other Ambulatory Visit: Payer: Self-pay | Admitting: Cardiovascular Disease

## 2021-03-14 NOTE — Progress Notes (Signed)
Cardiology Office Note  Date:  03/15/2021   ID:  Lilah, Mijangos 09-28-1945, MRN [id number]  PCP:  [name], MD   Chief Complaint  Patient presents with   6 month follow up    Patient c/o shortness of breath. Medications reviewed by the patient verbally.     HPI:  [name] [name] is a 75 year old woman with past medical history of Hemiparesis affecting right side from stroke Carotid stenosis COPD, 40-year smoking history Mild aortic valve stenosis CT scan 2019  : 50% right ICA origin stenosis,  50-55% left common carotid artery stenoses,  Severe right subclavian artery origin stenosis, and severe right and moderate left vertebral artery origin stenoses. Who presents for routine follow-up of her shortness of breath, weight gain, hypoxia,  Prior clinic visit June 2021 Chronic shortness of breath in the setting of higher weight, deconditioning, long smoking history, COPD on home oxygen, Aortic valve stenosis  We have previously recommended diuretic regiment below She is not actually using the regiment Lasix 20 mg daily for weigth 178 to 183 Weight >183, lasix 20 twice a day x 1 day until weight drops Weight <177, no lasix   Weight at home 175 Weight today 180, same as it was June 2021 Reports that she does weigh at home but does not take Lasix Denies significant leg swelling, abdominal distention She does drink a lot of Gatorade , G-0  Sedentary, no regular walking Uses oxygen at home  Denies anginal symptoms  Prior imaging reviewed with her Echocardiogram March 2021 Left ventricular ejection fraction, by estimation, is 60 to 65%. The  left ventricle has normal function. The left ventricle has no regional  wall motion abnormalities. There is mild left ventricular hypertrophy.  Left ventricular diastolic parameters  are indeterminate.   2. Right ventricular systolic function is mildly reduced. The right  ventricular size is normal. Tricuspid  regurgitation signal is inadequate  for assessing PA pressure.   3. The mitral valve is normal in structure. No evidence of mitral valve  regurgitation.   4. The aortic valve is abnormal. Moderately calcified. Aortic valve  regurgitation is mild. Mild to moderate aortic valve stenosis. Vmax 2.3  m/s, MG 14 mmHg, AVA 1.2 cm^2, DI 0.43   Prior Myoview stress test May 2020   EKG personally reviewed by myself on todays visit Shows normal sinus rhythm rate 83 bpm no significant ST-T wave changes  Other past medical history reviewed Echo 2019 - Left ventricle: estimated ejection fraction was in the range of 60%    to 65%. Wall motion was normal; there were no regional wall    motion abnormalities. Doppler parameters are consistent with    abnormal left ventricular relaxation (grade 1 diastolic    dysfunction). Indeterminate LV filling pressure.  - Aortic valve:severe  thickening; severely thickened, moderately calcified leaflets. Valve mobility was moderately restricted. There was mild stenosis. mild regurgitation. Mean gradient (S): 9 mm    Hg. Valve area (VTI): 1.5 cm^2.   Stress test: low risk 01/2019   PMH:   has a past medical history of (HFpEF) heart failure with preserved ejection fraction (HCC), Aneurysm (HCC), Anxiety, Anxiety and depression, Aortic stenosis, Carotid stenosis, CHF (congestive heart failure) (HCC), Compression fracture of body of thoracic vertebra (HCC), Concussion (08/03/2015), COPD (chronic obstructive pulmonary disease) (HCC) (12/2012), Depression, Fall (08/03/2015), Fracture of cervical vertebra, C5 (HCC) (08/06/2015), History of chicken pox, History of stress test, Hyperlipidemia, Hypertension, Hypothyroidism, Lower back pain, Neuropathy, On supplemental oxygen by  nasal cannula, Osteoarthritis, Osteoporosis (11/2010), Peripheral vascular disease (HCC), Smoker, Stroke (HCC) (2010), and Wears dentures.  PSH:    Past Surgical History:  Procedure Laterality Date    APPENDECTOMY  1960   CATARACT EXTRACTION     bilateral   CESAREAN SECTION     CHOLECYSTECTOMY  1970   COLONOSCOPY  2004   diverticulosis, no polyps Jarold Motto)   COLONOSCOPY  05/2016   decreased sphincter tone, diverticulosis, no f/u recommended ([facility])   DEXA  11/2010   T -2.7 spine, -1.9 hip   HIP SURGERY Left 2006   fractured - screws placed   IR ANGIO INTRA EXTRACRAN SEL COM CAROTID INNOMINATE BILAT MOD SED  08/01/2018   IR ANGIO INTRA EXTRACRAN SEL COM CAROTID INNOMINATE BILAT MOD SED  12/10/2018   IR ANGIO INTRA EXTRACRAN SEL COM CAROTID INNOMINATE BILAT MOD SED  06/26/2020   IR ANGIO VERTEBRAL SEL SUBCLAVIAN INNOMINATE UNI L MOD SED  06/26/2020   IR ANGIO VERTEBRAL SEL SUBCLAVIAN INNOMINATE UNI R MOD SED  08/01/2018   IR ANGIO VERTEBRAL SEL SUBCLAVIAN INNOMINATE UNI R MOD SED  12/10/2018   IR ANGIO VERTEBRAL SEL VERTEBRAL UNI L MOD SED  12/10/2018   IR RADIOLOGIST EVAL & MGMT  01/09/2018   KYPHOPLASTY  10/02/2017   Procedure: LUMBAR FOUR KYPHOPLASTY;  Surgeon: Coletta Memos, MD;  Location: [facility] OR;  Service: Neurosurgery;;   KYPHOPLASTY N/A 01/29/2020   Procedure: Thoracic Ten Kyphoplasty;  Surgeon: Coletta Memos, MD;  Location: [facility] OR;  Service: Neurosurgery;  Laterality: N/A;   RADIOLOGY WITH ANESTHESIA N/A 11/11/2015   Procedure: RADIOLOGY WITH ANESTHESIA;  Surgeon: Julieanne Cotton, MD;  Location: [facility] OR;  Service: Radiology;  Laterality: N/A;   RADIOLOGY WITH ANESTHESIA N/A 12/10/2018   Procedure: STENTING;  Surgeon: Julieanne Cotton, MD;  Location: [facility] OR;  Service: Radiology;  Laterality: N/A;    Current Outpatient Medications  Medication Sig Dispense Refill   acetaminophen (TYLENOL) 500 MG tablet Take 1 tablet (500 mg total) by mouth every 6 (six) hours as needed for headache.     albuterol (VENTOLIN HFA) 108 (90 Base) MCG/ACT inhaler INHALE 2 PUFFS INTO THE LUNGS EVERY 6 (SIX) HOURS AS NEEDED FOR WHEEZING OR SHORTNESS OF BREATH. 18 g 0   alendronate (FOSAMAX) 70 MG tablet Take with  a full glass of water on an empty stomach. 12 tablet 2   ascorbic acid (VITAMIN C) 250 MG CHEW Chew 500 mg by mouth daily.     aspirin (ASPIRIN 81) 81 MG EC tablet Take 1 tablet (81 mg total) by mouth daily. Swallow whole.     atorvastatin (LIPITOR) 40 MG tablet Take 1 tablet (40 mg total) by mouth daily. 90 tablet 3   calcium carbonate (TUMS - DOSED IN MG ELEMENTAL CALCIUM) 500 MG chewable tablet Chew 1 tablet by mouth daily as needed for indigestion or heartburn.     cetirizine (ZYRTEC) 10 MG tablet Take 10 mg by mouth daily.     Cholecalciferol (VITAMIN D3) 125 MCG (5000 UT) CAPS Take 5,000 Units by mouth daily. Gummies     clopidogrel (PLAVIX) 75 MG tablet Take 1 tablet (75 mg total) by mouth daily. 90 tablet 2   Cranberry-Vitamin C (AZO CRANBERRY URINARY TRACT PO) Take 1 tablet by mouth in the morning and at bedtime.      estradiol (ESTRACE) 0.1 MG/GM vaginal cream Estrogen Cream Instruction Discard applicator Apply pea sized amount to tip of finger to urethra before bed. Wash hands well after application. Use Monday,  Wednesday and Friday 42.5 g 12   ezetimibe (ZETIA) 10 MG tablet TAKE 1 TABLET (10 MG TOTAL) BY MOUTH DAILY. 90 tablet 0   gabapentin (NEURONTIN) 100 MG capsule Take 1 capsule (100 mg total) by mouth at bedtime. 90 capsule 2   levothyroxine (SYNTHROID) 50 MCG tablet Take 1 tablet (50 mcg total) by mouth daily before breakfast. 90 tablet 3   Melatonin 10 MG TABS Take 20 mg by mouth at bedtime. Gummies     mirabegron ER (MYRBETRIQ) 50 MG TB24 tablet Take 1 tablet (50 mg total) by mouth daily. 30 tablet 11   Multiple Vitamins-Minerals (MULTIVITAMIN PO) Take 1 tablet by mouth daily. Gummie     OXYGEN Inhale 2 L/min into the lungs See admin instructions. Inhale 2 L/min of oxygen at bedtime and during the day as needed for shortness of breath     pantoprazole (PROTONIX) 40 MG tablet TAKE 1 TABLET EVERY DAY AS NEEDED 90 tablet 3   Potassium Chloride ER 20 MEQ TBCR TAKE 2 TABLETS BY MOUTH  AS NEEDED (WHENEVER  YOU  TAKE  LASIX) (Patient taking differently: TAKE 2 TABLETS BY MOUTH AS NEEDED (WHENEVER  YOU  TAKE  LASIX)) 180 tablet 2   sertraline (ZOLOFT) 100 MG tablet Take 1 tablet (100 mg total) by mouth daily. 90 tablet 2   traMADol (ULTRAM) 50 MG tablet Take 1 tablet (50 mg total) by mouth 2 (two) times daily as needed for moderate pain. 20 tablet 0   traZODone (DESYREL) 100 MG tablet Take 1 tablet (100 mg total) by mouth at bedtime. 90 tablet 2   zinc gluconate 50 MG tablet Take 50 mg by mouth daily.     furosemide (LASIX) 20 MG tablet Take 1 tablet (20 mg total) by mouth daily as needed for fluid. Weight every morning before given 90 tablet 3   No current facility-administered medications for this visit.    Allergies:   Doxycycline, Oxycodone, and Fluticasone-salmeterol   Social History:  The patient  reports that she quit smoking about 8 years ago. Her smoking use included cigarettes. She has a 92.00 pack-year smoking history. She has never used smokeless tobacco. She reports that she does not drink alcohol and does not use drugs.   Family History:   family history includes CAD in her maternal aunt, mother, and sister; Cancer in her maternal grandmother and mother; Cirrhosis in her father; Diabetes in her maternal aunt; Hypertension in her daughter; Sudden death (age of onset: 4048) in her father.    Review of Systems: Review of Systems  Constitutional: Negative.   HENT: Negative.    Respiratory:  Positive for shortness of breath.   Cardiovascular: Negative.   Gastrointestinal: Negative.   Musculoskeletal: Negative.   Neurological: Negative.   Psychiatric/Behavioral: Negative.    All other systems reviewed and are negative.  PHYSICAL EXAM: VS:  BP 130/68 (BP Location: Right Arm, Patient Position: Sitting, Cuff Size: Normal)   Pulse 83   Ht 4\' 11"  (1.499 m)   Wt 180 lb (81.6 kg)   SpO2 97%   BMI 36.36 kg/m  , BMI Body mass index is 36.36 kg/m. Constitutional:   oriented to person, place, and time. No distress.  HENT:  Head: Grossly normal Eyes:  no discharge. No scleral icterus.  Neck: No JVD, no carotid bruits  Cardiovascular: Regular rate and rhythm, no murmurs appreciated Pulmonary/Chest: Decreased breath sounds, otherwise clear Abdominal: Soft.  no distension.  no tenderness.  Musculoskeletal: Normal range of motion  Neurological:  normal muscle tone. Coordination normal. No atrophy Skin: Skin warm and dry Psychiatric: normal affect, pleasant   Recent Labs: 08/05/2020: ALT 20; BUN 26; Creatinine, Ser 1.10; Hemoglobin 12.9; Platelets 177.0; Potassium 4.8; Sodium 138; TSH 5.19    Lipid Panel Lab Results  Component Value Date   CHOL 147 08/05/2020   HDL 67.80 08/05/2020   LDLCALC 63 08/05/2020   TRIG 80.0 08/05/2020     Wt Readings from Last 3 Encounters:  03/15/21 180 lb (81.6 kg)  02/09/21 175 lb 6 oz (79.5 kg)  12/03/20 173 lb (78.5 kg)     ASSESSMENT AND PLAN:  Problem List Items Addressed This Visit       Cardiology Problems   Hyperlipidemia   Relevant Medications   furosemide (LASIX) 20 MG tablet   Carotid stenosis   Relevant Medications   furosemide (LASIX) 20 MG tablet     Other   COPD (chronic obstructive pulmonary disease) (HCC)   Other Visit Diagnoses     Chronic heart failure with preserved ejection fraction (HCC)    -  Primary   Relevant Medications   furosemide (LASIX) 20 MG tablet   Other Relevant Orders   EKG 12-Lead   PAD (peripheral artery disease) (HCC)       Relevant Medications   furosemide (LASIX) 20 MG tablet   Other Relevant Orders   EKG 12-Lead   Essential hypertension       Relevant Medications   furosemide (LASIX) 20 MG tablet   Morbid obesity (HCC)       Aortic valve stenosis, etiology of cardiac valve disease unspecified       Relevant Medications   furosemide (LASIX) 20 MG tablet     Shortness of breath likely multifactorial Weight deconditioned, Long history of smoking 40  years, COPD Diastolic dysfunction, aortic valve stenosis --- Long discussion concerning her Lasix, weight is stable, recommend she take Lasix as needed for weight in the high 170 range at home Reports weight at home 175.  Weight is 180 here  Hyperlipidemia On Lipitor Zetia Numbers at goal  PAD CT scan 2019 reviewed 50% right ICA origin stenosis, 50-55% left common carotid artery stenoses, severe right subclavian artery origin stenosis, and severe right and moderate left vertebral artery origin stenoses. Continue statin and Zetia  COPD On home oxygen at night Suspect she will need oxygen at nighttime Recommend she check ambulatory saturations at home  Diastolic CHF Discussed indications for Lasix   Total encounter time more than 25 minutes  Greater than 50% was spent in counseling and coordination of care with the patient    Signed, Dossie Arbour, M.D., Ph.D. [facility] [place], [place] [phone]

## 2021-03-15 ENCOUNTER — Ambulatory Visit (HOSPITAL_COMMUNITY)
Admission: RE | Admit: 2021-03-15 | Discharge: 2021-03-15 | Disposition: A | Discharge status: Home / Self Care | Payer: Medicare HMO | Source: Ambulatory Visit | Attending: Interventional Radiology | Admitting: Interventional Radiology

## 2021-03-15 ENCOUNTER — Other Ambulatory Visit: Payer: Self-pay

## 2021-03-15 ENCOUNTER — Encounter: Payer: Self-pay | Admitting: Cardiovascular Disease

## 2021-03-15 ENCOUNTER — Ambulatory Visit: Payer: Medicare HMO | Admitting: Cardiovascular Disease

## 2021-03-15 VITALS — BP 130/68 | HR 83 | Ht 59.0 in | Wt 180.0 lb

## 2021-03-15 DIAGNOSIS — I5032 Chronic diastolic (congestive) heart failure: Secondary | ICD-10-CM | POA: Diagnosis not present

## 2021-03-15 DIAGNOSIS — J432 Centrilobular emphysema: Secondary | ICD-10-CM | POA: Diagnosis not present

## 2021-03-15 DIAGNOSIS — I350 Nonrheumatic aortic (valve) stenosis: Secondary | ICD-10-CM | POA: Diagnosis not present

## 2021-03-15 DIAGNOSIS — I10 Essential (primary) hypertension: Secondary | ICD-10-CM

## 2021-03-15 DIAGNOSIS — I739 Peripheral vascular disease, unspecified: Secondary | ICD-10-CM

## 2021-03-15 DIAGNOSIS — I6523 Occlusion and stenosis of bilateral carotid arteries: Secondary | ICD-10-CM | POA: Diagnosis not present

## 2021-03-15 DIAGNOSIS — E782 Mixed hyperlipidemia: Secondary | ICD-10-CM | POA: Diagnosis not present

## 2021-03-15 DIAGNOSIS — I771 Stricture of artery: Secondary | ICD-10-CM

## 2021-03-15 MED ORDER — FUROSEMIDE 20 MG PO TABS: 20.0000 mg | ORAL_TABLET | Freq: Every day | ORAL | 3 refills | Status: AC | PRN

## 2021-03-15 NOTE — Patient Instructions (Addendum)
Medication Instructions:  No changes  If you need a refill on your cardiac medications before your next appointment, please call your pharmacy.    Lab work: No new labs needed   If you have labs (blood work) drawn today and your tests are completely normal, you will receive your results only by: . MyChart Message (if you have MyChart) OR . A paper copy in the mail If you have any lab test that is abnormal or we need to change your treatment, we will call you to review the results.   Testing/Procedures: No new testing needed   Follow-Up: At [facility], you and your health needs are our priority.  As part of our continuing mission to provide you with exceptional heart care, we have created designated Provider Care Teams.  These Care Teams include your primary Cardiologist (physician) and Advanced Practice Providers (APPs -  Physician Assistants and Nurse Practitioners) who all work together to provide you with the care you need, when you need it.  . You will need a follow up appointment in 6 months  . Providers on your designated Care Team:   . Christopher Berge, NP . Ryan Dunn, PA-C . Jacquelyn Visser, PA-C  Any Other Special Instructions Will Be Listed Below (If Applicable).  COVID-19 Vaccine Information can be found at: [email or web address] For questions related to vaccine distribution or appointments, please email [email or web address] or call [phone].     

## 2021-03-17 DIAGNOSIS — J449 Chronic obstructive pulmonary disease, unspecified: Secondary | ICD-10-CM | POA: Diagnosis not present

## 2021-03-17 DIAGNOSIS — R269 Unspecified abnormalities of gait and mobility: Secondary | ICD-10-CM | POA: Diagnosis not present

## 2021-03-18 DIAGNOSIS — H40013 Open angle with borderline findings, low risk, bilateral: Secondary | ICD-10-CM | POA: Diagnosis not present

## 2021-03-22 ENCOUNTER — Other Ambulatory Visit (HOSPITAL_COMMUNITY): Payer: Self-pay | Admitting: Interventional Radiology

## 2021-03-22 DIAGNOSIS — I771 Stricture of artery: Secondary | ICD-10-CM

## 2021-03-31 ENCOUNTER — Other Ambulatory Visit: Payer: Self-pay | Admitting: Radiology

## 2021-03-31 NOTE — H&P (Addendum)
Chief Complaint: Patient was seen in consultation today for cerebral arteriogram at the request of Deveshwar,Sanjeev  Referring Physician(s): Deveshwar,Sanjeev  Supervising Physician: Julieanne Cottoneveshwar, Sanjeev  Patient [facility]: [facility]  History of Present Illness: [name] [name] is a 75 y.o. female with past medical history significant for anxiety, depression, CHF, aortic stenosis, HTN, HLD, PVD, COPD, CVA and known severe left ICA stenosis who presents today for a diagnostic cerebral angiogram. [name] [name] [facility] and has been followed since 2013 regularly for severe left ICA stenosis which has been treated with conservative management (ASA + Plavix). [name] [name] last underwent cerebral arteriogram on 06/30/20 which showed grossly no significant change compared to the arteriogram on 12/10/18. Patient was instructed to obtain US carotid in 6 months and MRA of the brain in 1 year. US carotid on 03/15/21 showed velocities in right ICA consistent with 40-59% stenosis and velocities in the left ICA are consistent with 1-39% stenosis. Patient [facility] to [facility] today for follow up cerebral arteriogram.   Patient laying in bed, not in acute distress. Reports no new neurological symptoms, no headache, blurry vision, dizziness.  Continues to take Plavix and ASA as directed.  Reports claudication on both legs, right worse than left.   Past Medical History:  Diagnosis Date   (HFpEF) heart failure with preserved ejection fraction (HCC)    a. 11/2019 Echo: EF 60-65%, no rwma. Mild LVH. Mild AI, mild to mod AS.   Aneurysm [facility](HCC)    [name] [name] had 2.6 cm dilation of the infrarenal abdominal aorta on 08/01/15 CTA with 5 year US recommended   Anxiety    Anxiety and depression    Aortic stenosis    mild-moderate AS 12/23/19 echo   Carotid stenosis    a. 06/2020 Carotid/Cerebral angio: LICA 70, L Vert 50, RCCA 50-60d, RICA 60-70p, 3.3x2.3 RICA aneurysm, RMCA M1 50-70.   CHF (congestive heart failure) (HCC)     Compression fracture of body of thoracic vertebra (HCC)    T 10   Concussion 08/03/2015   COPD (chronic obstructive pulmonary disease) (HCC) 12/2012   spirometry: Pre: FVC 84%, FEV1 69%, ratio 0.64 consistent with moderate obstruction.   Depression    Fall 08/03/2015   d/c home health 08/2015   Fracture of cervical vertebra, C5 (HCC) 08/06/2015   History of chicken pox    History of stress test    a. 01/2019 MV: No isch/infarct. EF 71%.   Hyperlipidemia    Hypertension    Hypothyroidism    Lower back pain    h/o HNP s/p surgery   Neuropathy    B/L feet   On supplemental oxygen by nasal cannula    at HS and PRN during the day   Osteoarthritis    h/o ruptured disc s/p ESI   Osteoporosis 11/2010   DEXA -2.7 spine, thoracic compression fracture   Peripheral vascular disease (HCC)    Smoker    quit 10/2012   Stroke (HCC) 2010   x3 with residual R hemiparesis, s/p R MCA balloon angioplasty (2010)   Wears dentures    upper    Past Surgical History:  Procedure Laterality Date   APPENDECTOMY  1960   CATARACT EXTRACTION     bilateral   CESAREAN SECTION     CHOLECYSTECTOMY  1970   COLONOSCOPY  2004   diverticulosis, no polyps Jarold Motto([place])   COLONOSCOPY  05/2016   decreased sphincter tone, diverticulosis, no f/u recommended ([place])   DEXA  11/2010  T -2.7 spine, -1.9 hip   HIP SURGERY Left 2006   fractured - screws placed   IR ANGIO INTRA EXTRACRAN SEL COM CAROTID INNOMINATE BILAT MOD SED  08/01/2018   IR ANGIO INTRA EXTRACRAN SEL COM CAROTID INNOMINATE BILAT MOD SED  12/10/2018   IR ANGIO INTRA EXTRACRAN SEL COM CAROTID INNOMINATE BILAT MOD SED  06/26/2020   IR ANGIO VERTEBRAL SEL SUBCLAVIAN INNOMINATE UNI L MOD SED  06/26/2020   IR ANGIO VERTEBRAL SEL SUBCLAVIAN INNOMINATE UNI R MOD SED  08/01/2018   IR ANGIO VERTEBRAL SEL SUBCLAVIAN INNOMINATE UNI R MOD SED  12/10/2018   IR ANGIO VERTEBRAL SEL VERTEBRAL UNI L MOD SED  12/10/2018   IR RADIOLOGIST EVAL & MGMT  01/09/2018    KYPHOPLASTY  10/02/2017   Procedure: LUMBAR FOUR KYPHOPLASTY;  Surgeon: Coletta Memos, MD;  Location: [facility] OR;  Service: Neurosurgery;;   KYPHOPLASTY N/A 01/29/2020   Procedure: Thoracic Ten Kyphoplasty;  Surgeon: Coletta Memos, MD;  Location: [facility] OR;  Service: Neurosurgery;  Laterality: N/A;   RADIOLOGY WITH ANESTHESIA N/A 11/11/2015   Procedure: RADIOLOGY WITH ANESTHESIA;  Surgeon: Julieanne Cotton, MD;  Location: [facility] OR;  Service: Radiology;  Laterality: N/A;   RADIOLOGY WITH ANESTHESIA N/A 12/10/2018   Procedure: STENTING;  Surgeon: Julieanne Cotton, MD;  Location: [facility] OR;  Service: Radiology;  Laterality: N/A;    Allergies: Doxycycline, Oxycodone, and Fluticasone-salmeterol  Medications: Prior to Admission medications   Medication Sig Start Date End Date Taking? Authorizing Provider  acetaminophen (TYLENOL) 500 MG tablet Take 1 tablet (500 mg total) by mouth every 6 (six) hours as needed for headache. Patient taking differently: Take 1,000 mg by mouth every 6 (six) hours as needed for headache. 01/01/19  Yes [name], MD  albuterol (VENTOLIN HFA) 108 (90 Base) MCG/ACT inhaler INHALE 2 PUFFS INTO THE LUNGS EVERY 6 (SIX) HOURS AS NEEDED FOR WHEEZING OR SHORTNESS OF BREATH. 02/03/20  Yes [name], MD  alendronate (FOSAMAX) 70 MG tablet Take with a full glass of water on an empty stomach. Patient taking differently: Take 70 mg by mouth every Tuesday. Take with a full glass of water on an empty stomach. 01/20/21  Yes [name], MD  aspirin (ASPIRIN 81) 81 MG EC tablet Take 1 tablet (81 mg total) by mouth daily. Swallow whole. 12/15/18  Yes [name], MD  atorvastatin (LIPITOR) 40 MG tablet Take 1 tablet (40 mg total) by mouth daily. 02/09/21  Yes [name], MD  Cholecalciferol (VITAMIN D) 50 MCG (2000 UT) tablet Take 2,000 Units by mouth daily.   Yes [provider]  clopidogrel (PLAVIX) 75 MG tablet Take 1 tablet (75 mg total) by mouth daily. 12/17/20  Yes  [name], MD  estradiol (ESTRACE) 0.1 MG/GM vaginal cream Estrogen Cream Instruction Discard applicator Apply pea sized amount to tip of finger to urethra before bed. Wash hands well after application. Use Monday, Wednesday and Friday Patient taking differently: Place 1 Applicatorful vaginally once a week. Estrogen Cream Instruction Discard applicator Apply pea sized amount to tip of finger to urethra before bed. Wash hands well after application. 11/06/20  Yes Sondra Come, MD  ezetimibe (ZETIA) 10 MG tablet TAKE 1 TABLET (10 MG TOTAL) BY MOUTH DAILY. Patient taking differently: Take 10 mg by mouth at bedtime. 02/26/21 05/27/21 Yes Gollan, Tollie Pizza, MD  furosemide (LASIX) 20 MG tablet Take 1 tablet (20 mg total) by mouth daily as needed for fluid. Weight every morning before given 03/15/21  Yes Gollan, Tollie Pizza,  MD  gabapentin (NEURONTIN) 100 MG capsule Take 1 capsule (100 mg total) by mouth at bedtime. 12/17/20  Yes [name], MD  levothyroxine (SYNTHROID) 50 MCG tablet Take 1 tablet (50 mcg total) by mouth daily before breakfast. 08/12/20  Yes [name], MD  Melatonin 5 MG CHEW Chew 15 mg by mouth daily.   Yes [provider]  mirabegron ER (MYRBETRIQ) 50 MG TB24 tablet Take 1 tablet (50 mg total) by mouth daily. 12/03/20  Yes Sondra Come, MD  pantoprazole (PROTONIX) 40 MG tablet TAKE 1 TABLET EVERY DAY AS NEEDED Patient taking differently: Take 40 mg by mouth daily as needed (acid reflux). 09/10/20  Yes [name], MD  Potassium Chloride ER 20 MEQ TBCR TAKE 2 TABLETS BY MOUTH AS NEEDED (WHENEVER  YOU  TAKE  LASIX) Patient taking differently: Take 40 mEq by mouth daily as needed (when taking lasix). 11/07/19  Yes [name], MD  sertraline (ZOLOFT) 100 MG tablet Take 1 tablet (100 mg total) by mouth daily. 12/17/20  Yes [name], MD  traMADol (ULTRAM) 50 MG tablet Take 1 tablet (50 mg total) by mouth 2 (two) times daily as needed for  moderate pain. 12/17/20  Yes [name], MD  traZODone (DESYREL) 100 MG tablet Take 1 tablet (100 mg total) by mouth at bedtime. 12/17/20  Yes [name], MD  vitamin C (ASCORBIC ACID) 500 MG tablet Take 500 mg by mouth daily.   Yes [provider]  Cranberry-Vitamin C (AZO CRANBERRY URINARY TRACT PO) Take 1 tablet by mouth in the morning and at bedtime.     [provider]  Multiple Vitamins-Minerals (MULTIVITAMIN PO) Take 1 tablet by mouth daily. Gummie    [provider]  OXYGEN Inhale 2 L/min into the lungs See admin instructions. Inhale 2 L/min of oxygen at bedtime and during the day as needed for shortness of breath    [provider]  zinc gluconate 50 MG tablet Take 50 mg by mouth daily.    [provider]     Family History  Problem Relation Age of Onset   CAD Mother        MI   Cancer Mother        cervical   CAD Maternal Aunt        MI   CAD Sister        MI   Sudden death Father 29       died in his sleep, chain smoker   Cirrhosis Father    Hypertension Daughter    Diabetes Maternal Aunt    Cancer Maternal Grandmother        cervical    Social History   Socioeconomic History   Marital status: Divorced    Spouse name: Not on file   Number of children: Not on file   Years of education: Not on file   Highest education level: Not on file  Occupational History   Not on file  Tobacco Use   Smoking status: Former    Packs/day: 2.00    Years: 46.00    Pack years: 92.00    Types: Cigarettes    Quit date: 11/08/2012    Years since quitting: 8.4   Smokeless tobacco: Never  Vaping Use   Vaping Use: Never used  Substance and Sexual Activity   Alcohol use: No    Alcohol/week: 0.0 standard drinks   Drug use: No   Sexual activity: Not Currently  Other Topics Concern  Not on file  Social History Narrative   Lives with granddaughter and great grandchildren   Occupation: retired, was Lawyer   Edu: 8th grade    Activity: no regular activity:   Diet: some water, fruits/vegetables daily   Social Determinants of Corporate investment banker Strain: Low Risk    Difficulty of Paying Living Expenses: Not very hard  Food Insecurity: Not on file  Transportation Needs: Not on file  Physical Activity: Not on file  Stress: Not on file  Social Connections: Not on file     Review of Systems: A 12 point ROS discussed and pertinent positives are indicated in the HPI above.  All other systems are negative.   Vital Signs: BP (!) 141/65   Pulse 74   Temp 97.8 F (36.6 C) (Oral)   Resp 16   Ht 4\' 11"  (1.499 m)   Wt 175 lb (79.4 kg)   SpO2 97%   BMI 35.35 kg/m   Physical Exam Vitals reviewed.  Constitutional:      General: [name] [name] is not in acute distress.    Appearance: Normal appearance. [name] [name] is not ill-appearing.  HENT:     Head: Normocephalic and atraumatic.     Mouth/Throat:     Mouth: Mucous membranes are moist.  Cardiovascular:     Rate and Rhythm: Normal rate and regular rhythm.     Pulses: Normal pulses.     Heart sounds: Normal heart sounds.     Comments: Bilateral DP dopplerable  Pulmonary:     Effort: Pulmonary effort is normal.     Breath sounds: Normal breath sounds.  Abdominal:     General: Abdomen is flat. Bowel sounds are normal.     Palpations: Abdomen is soft.  Musculoskeletal:     Cervical back: Neck supple.  Skin:    General: Skin is warm and dry.     Coloration: Skin is not jaundiced or pale.  Neurological:     Mental Status: [name] [name] is alert and oriented to person, place, and time.  Psychiatric:        Mood and Affect: Mood normal.        Behavior: Behavior normal.        Judgment: Judgment normal.    MD Evaluation Airway: WNL Heart: WNL Abdomen: WNL Chest/ Lungs: WNL ASA  Classification: 2 Mallampati/Airway Score: Two  Imaging: VAS CAROTID  Result Date: 03/15/2021 Carotid Arterial Duplex Study Patient Name:  [name] [name]  Date of Exam:   03/15/2021  Medical Rec #: 03/17/2021          Accession #:    [id number] Date of Birth: 07-19-1946         Patient Gender: F Patient Age:   11Y Exam Location:  [facility] Procedure:      VAS [facility] CAROTID Referring Phys: [id number] [facility] DEVESHWAR --------------------------------------------------------------------------------  Indications:       Follow up stenosis RT ICA. Risk Factors:      Hypertension, hyperlipidemia, past history of smoking, prior                    CVA. Comparison Study:  06-30-2020 IR Angio Vertebral Sel Subclavian Innominate                    showed approximately 50-60% stenosis of the distal right                    common carotid artery, with approximately  60-70% stenosis of                    the proximal right ICA. Performing Technologist: Jean Rosenthal RDMS,RVT  Examination Guidelines: A complete evaluation includes B-mode imaging, spectral Doppler, color Doppler, and power Doppler as needed of all accessible portions of each vessel. Bilateral testing is considered an integral part of a complete examination. Limited examinations for reoccurring indications may be performed as noted.  Right Carotid Findings: +----------+--------+--------+--------+-------------------+--------------------+           PSV cm/sEDV cm/sStenosisPlaque Description Comments             +----------+--------+--------+--------+-------------------+--------------------+ CCA Prox  125     18                                 tortuous             +----------+--------+--------+--------+-------------------+--------------------+ CCA Distal132     20              heterogenous                            +----------+--------+--------+--------+-------------------+--------------------+ ICA Prox  305     38      40-59%  heterogenous and   Velocities may                                         irregular          underestimate degree                                                      of stenosis due  to                                                        more proximal                                                             obstruction.         +----------+--------+--------+--------+-------------------+--------------------+ ICA Mid   253     28                                                      +----------+--------+--------+--------+-------------------+--------------------+ ICA Distal108     11                                                      +----------+--------+--------+--------+-------------------+--------------------+ ECA  192                     heterogenous                            +----------+--------+--------+--------+-------------------+--------------------+ +----------+--------+-------+----------------+-------------------+           PSV cm/sEDV cmsDescribe        Arm Pressure (mmHG) +----------+--------+-------+----------------+-------------------+ Subclavian240            Multiphasic, WNL                    +----------+--------+-------+----------------+-------------------+ +---------+--------+--+--------+-+---------+ VertebralPSV cm/s21EDV cm/s6Antegrade +---------+--------+--+--------+-+---------+  Left Carotid Findings: +----------+--------+--------+--------+--------------------------+--------+           PSV cm/sEDV cm/sStenosisPlaque Description        Comments +----------+--------+--------+--------+--------------------------+--------+ CCA Prox  147     13                                                 +----------+--------+--------+--------+--------------------------+--------+ CCA Distal161     23              heterogenous              tortuous +----------+--------+--------+--------+--------------------------+--------+ ICA Prox  110     19      1-39%   heterogenous and irregular         +----------+--------+--------+--------+--------------------------+--------+ ICA Mid   55      12                                                  +----------+--------+--------+--------+--------------------------+--------+ ECA       123     16              heterogenous                       +----------+--------+--------+--------+--------------------------+--------+ +----------+--------+--------+----------------+-------------------+           PSV cm/sEDV cm/sDescribe        Arm Pressure (mmHG) +----------+--------+--------+----------------+-------------------+ [id number]              Multiphasic, WNL                    +----------+--------+--------+----------------+-------------------+ +---------+--------+--+--------+--+---------+ VertebralPSV cm/s61EDV cm/s12Antegrade +---------+--------+--+--------+--+---------+   Summary: Right Carotid: Velocities in the right ICA are consistent with a 40-59%                stenosis. Left Carotid: Velocities in the left ICA are consistent with a 1-39% stenosis. Vertebrals:  Bilateral vertebral arteries demonstrate antegrade flow. Subclavians: Right subclavian artery was stenotic. Normal flow hemodynamics were              seen in the left subclavian artery. *See table(s) above for measurements and observations.  Electronically signed by Fabienne Bruns MD on 03/15/2021 at 6:42:42 PM.    Final     Labs:  CBC: Recent Labs    06/26/20 0716 08/05/20 1108 04/02/21 0615  WBC 6.4 7.5 7.0  HGB 11.4* 12.9 13.3  HCT 38.9 38.7 42.7  PLT 137* 177.0 172    COAGS: Recent Labs    06/26/20 0716 04/02/21 0615  INR 1.0  1.0    BMP: Recent Labs    06/26/20 0716 08/05/20 1108 04/02/21 0615  NA 140 138 138  K 4.6 4.8 4.3  CL 103 100 101  CO2 GLUCOSE 102* 91 99  BUN 21 26* 23  CALCIUM 8.9 9.5 9.5  CREATININE 0.92 1.10 0.98  GFRNONAA >60  --  >60  GFRAA >60  --   --     LIVER FUNCTION TESTS: Recent Labs    08/05/20 1108  BILITOT 0.5  AST 25  ALT 20  ALKPHOS 89  PROT 7.4  ALBUMIN 4.4    TUMOR MARKERS: No results for  input(s): AFPTM, CEA, CA199, CHROMGRNA in the last 8760 hours.  Assessment and Plan: 75 y.o. female well known to [facility] and has been followed since 2013 regularly for severe left ICA stenosis which has been treated with conservative management (ASA + Plavix). [name] [name] last underwent cerebral arteriogram on 06/30/20 which showed grossly no significant change compared to the arteriogram on 12/10/18. Patient was instructed to obtain US carotid in 6 months and MRA of the brain in 1 year.  US carotid on 03/15/21 showed velocities in right ICA consistent with 40-59% stenosis and velocities in the left ICA are consistent with 1-39% stenosis. Patient present to [facility] today for follow up cerebral arteriogram.   Npo since midnight  VSS CBC w/in normal limit  INR 1.0 BMP RF normal BUN 23 Creatinine 0.98 GFR greater than 60  On ASA and Plavix   Risks and benefits of cerebral angiogram with intervention were discussed with the patient including, but not limited to bleeding, infection, vascular injury, contrast induced renal failure, stroke or even death.  This interventional procedure involves the use of X-rays and because of the nature of the planned procedure, it is possible that we will have prolonged use of X-ray fluoroscopy.  Potential radiation risks to you include (but are not limited to) the following: - A slightly elevated risk for cancer  several years later in life. This risk is typically less than 0.5% percent. This risk is low in comparison to the normal incidence of human cancer, which is 33% for women and 50% for men according to the American Cancer Society. - Radiation induced injury can include skin redness, resembling a rash, tissue breakdown / ulcers and hair loss (which can be temporary or permanent).   The likelihood of either of these occurring depends on the difficulty of the procedure and whether you are sensitive to radiation due to previous procedures, disease, or genetic conditions.    IF your procedure requires a prolonged use of radiation, you will be notified and given written instructions for further action.  It is your responsibility to monitor the irradiated area for the 2 weeks following the procedure and to notify your physician if you are concerned that you have suffered a radiation induced injury.    All of the patient's questions were answered, patient is agreeable to proceed.  Consent signed and in chart.    Thank you for this interesting consult.  I greatly enjoyed meeting [name] [name] and look forward to participating in their care.  A copy of this report was sent to the requesting provider on this date.  Electronically Signed: Willette Brace, PA-C 04/02/2021, 8:07 AM   I spent a total of    25 Minutes in face to face in clinical consultation, greater than 50% of which was counseling/coordinating care for cerebral arteriogram

## 2021-04-02 ENCOUNTER — Other Ambulatory Visit: Payer: Self-pay

## 2021-04-02 ENCOUNTER — Encounter (HOSPITAL_COMMUNITY): Payer: Self-pay

## 2021-04-02 ENCOUNTER — Ambulatory Visit (HOSPITAL_COMMUNITY)
Admission: RE | Admit: 2021-04-02 | Discharge: 2021-04-02 | Disposition: A | Discharge status: Home / Self Care | Payer: Medicare HMO | Source: Ambulatory Visit | Attending: Interventional Radiology | Admitting: Interventional Radiology

## 2021-04-02 DIAGNOSIS — Z7982 Long term (current) use of aspirin: Secondary | ICD-10-CM | POA: Diagnosis not present

## 2021-04-02 DIAGNOSIS — Z87891 Personal history of nicotine dependence: Secondary | ICD-10-CM | POA: Diagnosis not present

## 2021-04-02 DIAGNOSIS — Z8673 Personal history of transient ischemic attack (TIA), and cerebral infarction without residual deficits: Secondary | ICD-10-CM | POA: Diagnosis not present

## 2021-04-02 DIAGNOSIS — Z881 Allergy status to other antibiotic agents status: Secondary | ICD-10-CM | POA: Diagnosis not present

## 2021-04-02 DIAGNOSIS — J449 Chronic obstructive pulmonary disease, unspecified: Secondary | ICD-10-CM | POA: Diagnosis not present

## 2021-04-02 DIAGNOSIS — Z7902 Long term (current) use of antithrombotics/antiplatelets: Secondary | ICD-10-CM | POA: Insufficient documentation

## 2021-04-02 DIAGNOSIS — Z885 Allergy status to narcotic agent status: Secondary | ICD-10-CM | POA: Insufficient documentation

## 2021-04-02 DIAGNOSIS — I6622 Occlusion and stenosis of left posterior cerebral artery: Secondary | ICD-10-CM | POA: Diagnosis not present

## 2021-04-02 DIAGNOSIS — I509 Heart failure, unspecified: Secondary | ICD-10-CM | POA: Insufficient documentation

## 2021-04-02 DIAGNOSIS — I6523 Occlusion and stenosis of bilateral carotid arteries: Secondary | ICD-10-CM | POA: Diagnosis not present

## 2021-04-02 DIAGNOSIS — E785 Hyperlipidemia, unspecified: Secondary | ICD-10-CM | POA: Diagnosis not present

## 2021-04-02 DIAGNOSIS — F329 Major depressive disorder, single episode, unspecified: Secondary | ICD-10-CM | POA: Insufficient documentation

## 2021-04-02 DIAGNOSIS — Z79899 Other long term (current) drug therapy: Secondary | ICD-10-CM | POA: Diagnosis not present

## 2021-04-02 DIAGNOSIS — I350 Nonrheumatic aortic (valve) stenosis: Secondary | ICD-10-CM | POA: Diagnosis not present

## 2021-04-02 DIAGNOSIS — F419 Anxiety disorder, unspecified: Secondary | ICD-10-CM | POA: Insufficient documentation

## 2021-04-02 DIAGNOSIS — I6601 Occlusion and stenosis of right middle cerebral artery: Secondary | ICD-10-CM | POA: Diagnosis not present

## 2021-04-02 DIAGNOSIS — I771 Stricture of artery: Secondary | ICD-10-CM

## 2021-04-02 DIAGNOSIS — I110 Hypertensive heart disease with heart failure: Secondary | ICD-10-CM | POA: Insufficient documentation

## 2021-04-02 DIAGNOSIS — Z79890 Hormone replacement therapy (postmenopausal): Secondary | ICD-10-CM | POA: Diagnosis not present

## 2021-04-02 HISTORY — PX: IR ANGIO INTRA EXTRACRAN SEL COM CAROTID INNOMINATE BILAT MOD SED: IMG5360

## 2021-04-02 HISTORY — PX: IR ANGIO VERTEBRAL SEL SUBCLAVIAN INNOMINATE BILAT MOD SED: IMG5366

## 2021-04-02 LAB — BASIC METABOLIC PANEL
Anion gap: 7 (ref 5–15)
BUN: 23 mg/dL (ref 8–23)
CO2: 30 mmol/L (ref 22–32)
Calcium: 9.5 mg/dL (ref 8.9–10.3)
Chloride: 101 mmol/L (ref 98–111)
Creatinine, Ser: 0.98 mg/dL (ref 0.44–1.00)
GFR, Estimated: >60 mL/min (ref >60)
Glucose, Bld: 99 mg/dL (ref 70–99)
Potassium: 4.3 mmol/L (ref 3.5–5.1)
Sodium: 138 mmol/L (ref 135–145)

## 2021-04-02 LAB — CBC
HCT: 42.7 % (ref 36.0–46.0)
Hemoglobin: 13.3 g/dL (ref 12.0–15.0)
MCH: 30.5 pg (ref 26.0–34.0)
MCHC: 31.1 g/dL (ref 30.0–36.0)
MCV: 97.9 fL (ref 80.0–100.0)
Platelets: 172 10*3/uL (ref 150–400)
RBC: 4.36 MIL/uL (ref 3.87–5.11)
RDW: 12.2 % (ref 11.5–15.5)
WBC: 7 10*3/uL (ref 4.0–10.5)
nRBC: 0 % (ref 0.0–0.2)

## 2021-04-02 LAB — PROTIME-INR
INR: 1 (ref 0.8–1.2)
Prothrombin Time: 13.3 seconds (ref 11.4–15.2)

## 2021-04-02 MED ORDER — SODIUM CHLORIDE 0.9 % IV SOLN: INTRAVENOUS | Status: AC

## 2021-04-02 MED ORDER — FENTANYL CITRATE (PF) 100 MCG/2ML IJ SOLN
INTRAMUSCULAR | Status: AC
  Filled 2021-04-02: qty 2

## 2021-04-02 MED ORDER — LIDOCAINE HCL 1 % IJ SOLN
INTRAMUSCULAR | Status: AC
  Filled 2021-04-02: qty 20

## 2021-04-02 MED ORDER — SODIUM CHLORIDE 0.9 % IV SOLN: Freq: Once | INTRAVENOUS | Status: DC

## 2021-04-02 MED ORDER — HEPARIN SODIUM (PORCINE) 1000 UNIT/ML IJ SOLN
INTRAMUSCULAR | Status: AC
  Filled 2021-04-02: qty 1

## 2021-04-02 MED ORDER — HEPARIN SODIUM (PORCINE) 1000 UNIT/ML IJ SOLN
INTRAMUSCULAR | Status: AC | PRN
  Administered 2021-04-02: 1000 [IU] via INTRAVENOUS

## 2021-04-02 MED ORDER — MIDAZOLAM HCL 2 MG/2ML IJ SOLN
INTRAMUSCULAR | Status: AC
  Filled 2021-04-02: qty 2

## 2021-04-02 MED ORDER — VERAPAMIL HCL 2.5 MG/ML IV SOLN
INTRAVENOUS | Status: AC
  Filled 2021-04-02: qty 2

## 2021-04-02 MED ORDER — LIDOCAINE HCL 1 % IJ SOLN
INTRAMUSCULAR | Status: AC | PRN
  Administered 2021-04-02: 10 mL

## 2021-04-02 MED ORDER — IOHEXOL 300 MG/ML  SOLN
100.0000 mL | Freq: Once | INTRAMUSCULAR | Status: AC | PRN
  Administered 2021-04-02: 65 mL

## 2021-04-02 MED ORDER — FENTANYL CITRATE (PF) 100 MCG/2ML IJ SOLN
INTRAMUSCULAR | Status: AC | PRN
  Administered 2021-04-02: 25 ug via INTRAVENOUS

## 2021-04-02 MED ORDER — MIDAZOLAM HCL 2 MG/2ML IJ SOLN
INTRAMUSCULAR | Status: AC | PRN
  Administered 2021-04-02: 1 mg via INTRAVENOUS

## 2021-04-02 MED ORDER — HYDRALAZINE HCL 20 MG/ML IJ SOLN
INTRAMUSCULAR | Status: AC
  Filled 2021-04-02: qty 1

## 2021-04-02 MED ORDER — HYDRALAZINE HCL 20 MG/ML IJ SOLN
INTRAMUSCULAR | Status: AC | PRN
  Administered 2021-04-02: 5 mg via INTRAVENOUS

## 2021-04-02 NOTE — Procedures (Signed)
S/P bilateral common carotid  and Lt [facility] arteriograms RT CFA approach . Findings. 1.Approx 65 % stenosis RT CCA prox to bifurcation. 2.Severe RT MCA M1 stenosis  stable. 3.Approx 55% stenosis Lt CCA mid seg. 4. Soft ASplaque post wall od Lt ICA distall to the bulb. Stable 5.5 mm x 2.7 mm RT ICA pet/cav aneurysm. 5.Approx 70 % stenosis Lt ICA cav seg. S.Semaj Kham MD

## 2021-04-02 NOTE — Sedation Documentation (Signed)
Sheath pulled at 0850, manual pressure held until 0906.  Site is level 0 with pulses dopplerable

## 2021-04-05 ENCOUNTER — Other Ambulatory Visit (HOSPITAL_COMMUNITY): Payer: Self-pay | Admitting: Interventional Radiology

## 2021-04-05 DIAGNOSIS — I771 Stricture of artery: Secondary | ICD-10-CM

## 2021-04-13 ENCOUNTER — Telehealth: Payer: Self-pay

## 2021-04-13 NOTE — Chronic Care Management (AMB) (Addendum)
Chronic Care Management Pharmacy Assistant   Name: [name] [name]  MRN: [id number] DOB: [birth year]  Reason for Encounter: Adherence Review   Recent office visits:  02/09/21 - Dr.Gutierrez, PCP - no medication changes follow up 6 months  Recent consult visits:  03/15/21 - Cardiology - Lasix 20 mg daily for weight 178 to 183. Weight >183, lasix 20 twice a day x 1 day until weight drops Weight <177, no lasix.    Hospital visits:  None in previous 6 months  Medications: Outpatient Encounter Medications as of 04/13/2021  Medication Sig Note   acetaminophen (TYLENOL) 500 MG tablet Take 1 tablet (500 mg total) by mouth every 6 (six) hours as needed for headache.    albuterol (VENTOLIN HFA) 108 (90 Base) MCG/ACT inhaler INHALE 2 PUFFS INTO THE LUNGS EVERY 6 (SIX) HOURS AS NEEDED FOR WHEEZING OR SHORTNESS OF BREATH.    alendronate (FOSAMAX) 70 MG tablet Take with a full glass of water on an empty stomach.    aspirin (ASPIRIN 81) 81 MG EC tablet Take 1 tablet (81 mg total) by mouth daily. Swallow whole.    atorvastatin (LIPITOR) 40 MG tablet Take 1 tablet (40 mg total) by mouth daily.    Cholecalciferol (VITAMIN D) 50 MCG (2000 UT) tablet Take 2,000 Units by mouth daily.    clopidogrel (PLAVIX) 75 MG tablet Take 1 tablet (75 mg total) by mouth daily.    Cranberry-Vitamin C (AZO CRANBERRY URINARY TRACT PO) Take 1 tablet by mouth in the morning and at bedtime.  03/26/2021: Pt ran out, needs to get more   estradiol (ESTRACE) 0.1 MG/GM vaginal cream Estrogen Cream Instruction Discard applicator Apply pea sized amount to tip of finger to urethra before bed. Wash hands well after application. Use Monday, Wednesday and Friday    ezetimibe (ZETIA) 10 MG tablet TAKE 1 TABLET (10 MG TOTAL) BY MOUTH DAILY.    furosemide (LASIX) 20 MG tablet Take 1 tablet (20 mg total) by mouth daily as needed for fluid. Weight every morning before given    gabapentin (NEURONTIN) 100 MG capsule Take 1 capsule (100 mg  total) by mouth at bedtime.    levothyroxine (SYNTHROID) 50 MCG tablet Take 1 tablet (50 mcg total) by mouth daily before breakfast.    Melatonin 5 MG CHEW Chew 15 mg by mouth daily.    mirabegron ER (MYRBETRIQ) 50 MG TB24 tablet Take 1 tablet (50 mg total) by mouth daily.    Multiple Vitamins-Minerals (MULTIVITAMIN PO) Take 1 tablet by mouth daily. Gummie 03/26/2021: Pt ran out, needs to get more   OXYGEN Inhale 2 L/min into the lungs See admin instructions. Inhale 2 L/min of oxygen at bedtime and during the day as needed for shortness of breath    pantoprazole (PROTONIX) 40 MG tablet TAKE 1 TABLET EVERY DAY AS NEEDED    Potassium Chloride ER 20 MEQ TBCR TAKE 2 TABLETS BY MOUTH AS NEEDED (WHENEVER  YOU  TAKE  LASIX)    sertraline (ZOLOFT) 100 MG tablet Take 1 tablet (100 mg total) by mouth daily.    traMADol (ULTRAM) 50 MG tablet Take 1 tablet (50 mg total) by mouth 2 (two) times daily as needed for moderate pain.    traZODone (DESYREL) 100 MG tablet Take 1 tablet (100 mg total) by mouth at bedtime.    vitamin C (ASCORBIC ACID) 500 MG tablet Take 500 mg by mouth daily.    zinc gluconate 50 MG tablet Take 50 mg by mouth daily.  03/26/2021: Pt ran out, needs to get more   No facility-administered encounter medications on file as of 04/13/2021.   Contacted Kniyah S Spires on 04/13/21 for general disease state and medication adherence call.   Patient is not > 5 days past due for refill on the following medications per chart history:  Star Medications: Medication Name/mg Last Fill Days Supply Atorvastatin 40mg   02/10/21 90  What concerns do you have about your medications? No concerns at this time  The patient denies side effects with her medications.   How often do you forget or accidentally miss a dose? Never  Do you use a pillbox? Yes the patient states her daughter puts together on Sundays for the week  Are you having any problems getting your medications from your pharmacy? No the patient  reports she uses [facility] mail order  Has the cost of your medications been a concern? No the Myrbetriq is her most expensive medication and she wished it came in generic form  Since last visit with CPP, the following interventions have been made: 12/18/20 the patient got RX for portable over the shoulder oxygen tank  The patient has not had an ED visit since last contact.   The patient denies problems with their health. Patient weight today is 177 lbs  she denies  concerns or questions for 12/20/20, Pharm. D at this time.   Counseled patient on:  Phil Dopp job taking medications, Importance of taking medication daily without missed doses, Benefits of adherence packaging or a pillbox, and Access to CCM team for any cost, medication or pharmacy concerns.   PCP appointment on 08/16/21 and CCM appointment on 06/17/21  06/19/21, CPP notified  Phil Dopp, [facility] Clincal Pharmacy Assistant [phone]  I have reviewed the care management and care coordination activities outlined in this encounter and I am certifying that I agree with the content of this note. No further action required.  [phone], PharmD Clinical Pharmacist [facility] at [facility] [phone]

## 2021-04-16 ENCOUNTER — Telehealth: Payer: Self-pay | Admitting: Family Medicine

## 2021-04-16 DIAGNOSIS — R269 Unspecified abnormalities of gait and mobility: Secondary | ICD-10-CM | POA: Diagnosis not present

## 2021-04-16 DIAGNOSIS — J449 Chronic obstructive pulmonary disease, unspecified: Secondary | ICD-10-CM | POA: Diagnosis not present

## 2021-04-16 NOTE — Telephone Encounter (Signed)
Received request to send tramadol to mail order. I don't recommend controlled substances through mail order esp when used PRN. Recommend she continue getting through [facility].

## 2021-04-16 NOTE — Telephone Encounter (Signed)
Spoke with pt relaying Dr. Timoteo Expose message.  She verbalizes understanding.  States daughter, [name], was just trying to get all pt's meds through mail order pharmacy.  Says she will let her know.

## 2021-05-17 DIAGNOSIS — R269 Unspecified abnormalities of gait and mobility: Secondary | ICD-10-CM | POA: Diagnosis not present

## 2021-05-17 DIAGNOSIS — J449 Chronic obstructive pulmonary disease, unspecified: Secondary | ICD-10-CM | POA: Diagnosis not present

## 2021-06-02 ENCOUNTER — Encounter: Payer: Self-pay | Admitting: Family Medicine

## 2021-06-02 ENCOUNTER — Other Ambulatory Visit: Payer: Self-pay

## 2021-06-02 ENCOUNTER — Ambulatory Visit (INDEPENDENT_AMBULATORY_CARE_PROVIDER_SITE_OTHER): Payer: Medicare HMO | Admitting: Family Medicine

## 2021-06-02 VITALS — BP 126/80 | HR 64 | Temp 98.2°F | Ht 59.0 in | Wt 183.3 lb

## 2021-06-02 DIAGNOSIS — Z23 Encounter for immunization: Secondary | ICD-10-CM

## 2021-06-02 DIAGNOSIS — R0602 Shortness of breath: Secondary | ICD-10-CM

## 2021-06-02 DIAGNOSIS — G629 Polyneuropathy, unspecified: Secondary | ICD-10-CM | POA: Diagnosis not present

## 2021-06-02 DIAGNOSIS — E039 Hypothyroidism, unspecified: Secondary | ICD-10-CM | POA: Diagnosis not present

## 2021-06-02 DIAGNOSIS — J432 Centrilobular emphysema: Secondary | ICD-10-CM | POA: Diagnosis not present

## 2021-06-02 DIAGNOSIS — F331 Major depressive disorder, recurrent, moderate: Secondary | ICD-10-CM

## 2021-06-02 DIAGNOSIS — R5383 Other fatigue: Secondary | ICD-10-CM | POA: Diagnosis not present

## 2021-06-02 DIAGNOSIS — R5381 Other malaise: Secondary | ICD-10-CM

## 2021-06-02 DIAGNOSIS — R296 Repeated falls: Secondary | ICD-10-CM | POA: Diagnosis not present

## 2021-06-02 DIAGNOSIS — J9611 Chronic respiratory failure with hypoxia: Secondary | ICD-10-CM

## 2021-06-02 MED ORDER — TRAMADOL HCL 50 MG PO TABS: 50.0000 mg | ORAL_TABLET | Freq: Two times a day (BID) | ORAL | 0 refills | Status: AC | PRN

## 2021-06-02 MED ORDER — MELATONIN 5 MG PO CHEW: 10.0000 mg | CHEWABLE_TABLET | Freq: Every day | ORAL | Status: AC

## 2021-06-02 MED ORDER — FLUTICASONE-SALMETEROL 115-21 MCG/ACT IN AERO: 2.0000 | INHALATION_SPRAY | Freq: Two times a day (BID) | RESPIRATORY_TRACT | 12 refills | Status: AC

## 2021-06-02 MED ORDER — TRAZODONE HCL 50 MG PO TABS: 50.0000 mg | ORAL_TABLET | Freq: Every day | ORAL | 1 refills | Status: DC

## 2021-06-02 NOTE — Patient Instructions (Addendum)
Flu shot today  Labs today  Ok to continue melatonin.  Trial off gabapentin (see how you do, ok to restart if needed).  Try lower dose trazodone (50mg  sent to pharmacy).  Restart advair inhaler 2 puffs twice daily. This should help breathing.  We will refer you to physical therapy.  Update me in 2-3 weeks with how you're doing. We may add second mood medicine in addition to sertraline.

## 2021-06-02 NOTE — Progress Notes (Signed)
Patient ID: [name] [name], female    DOB: 10/21/1945, 75 y.o.   MRN: [id number]  This visit was conducted in person.  BP 126/80   Pulse 64   Temp 98.2 F (36.8 C) (Temporal)   Ht 4\' 11"  (1.499 m)   Wt 183 lb 5 oz (83.2 kg)   SpO2 98%   BMI 37.02 kg/m    CC: discuss mood Subjective:   HPI: [name] [name] is a 75 y.o. female presenting on 06/02/2021 for Follow-up (Here for mood f/u.  Pt accompanied by daughter, 08/02/2021- temp 98.5. )   Longstanding on sertraline 100mg  daily with trazodone 100mg  + melatonin 15mg  nightly for sleep  Daughter notes severe depression, worse over the past 8-9 months. No interest going anywhere, doing anything, decreased energy,   Planning to move back in with daughter [name] [name].   O2 dependent COPD - she attributes lack of energy to ongoing dyspnea. Continues albuterol PRN, but has not been taking advair for the last 1+ year - states due to cost. Agrees to restart this.  Uses oxygen at night as well as PRN during the day.   Notes some increased difficulty with ambulation and weakness.  No recent falls but she has had some near falls (recently fell into wall while doing laundry).  Uses rollator regularly when out of house.  They request outpatient PT referral.   Activities of Daily Living:     Bathing- dependent     Dressing- dependent     Eating- independent    Toileting- independent    Transferring- partially dependent    Continence- dependent (wears pull ups) Overall Assessment: dependent   Instrumental Activities of Daily Living:     Transportation- dependent    Meal/Food Preparation- dependent    Shopping Errands- dependent    Housekeeping/Chores- dependent    Money Management/Finances- partially dependent    Medication Management- dependent    Ability to Use Telephone- partially dependent    Laundry- dependent Overall Assessment: dependent     Relevant past medical, surgical, family and social history reviewed and updated as  indicated. Interim medical history since our last visit reviewed. Allergies and medications reviewed and updated. Outpatient Medications Prior to Visit  Medication Sig Dispense Refill   acetaminophen (TYLENOL) 500 MG tablet Take 1 tablet (500 mg total) by mouth every 6 (six) hours as needed for headache.     albuterol (VENTOLIN HFA) 108 (90 Base) MCG/ACT inhaler INHALE 2 PUFFS INTO THE LUNGS EVERY 6 (SIX) HOURS AS NEEDED FOR WHEEZING OR SHORTNESS OF BREATH. 18 g 0   alendronate (FOSAMAX) 70 MG tablet Take with a full glass of water on an empty stomach. 12 tablet 2   aspirin (ASPIRIN 81) 81 MG EC tablet Take 1 tablet (81 mg total) by mouth daily. Swallow whole.     atorvastatin (LIPITOR) 40 MG tablet Take 1 tablet (40 mg total) by mouth daily. 90 tablet 3   Cholecalciferol (VITAMIN D) 50 MCG (2000 UT) tablet Take 2,000 Units by mouth daily.     clopidogrel (PLAVIX) 75 MG tablet Take 1 tablet (75 mg total) by mouth daily. 90 tablet 2   Cranberry-Vitamin C (AZO CRANBERRY URINARY TRACT PO) Take 1 tablet by mouth in the morning and at bedtime.      estradiol (ESTRACE) 0.1 MG/GM vaginal cream Estrogen Cream Instruction Discard applicator Apply pea sized amount to tip of finger to urethra before bed. Wash hands well after application. Use Monday, Wednesday and Friday 42.5  g 12   furosemide (LASIX) 20 MG tablet Take 1 tablet (20 mg total) by mouth daily as needed for fluid. Weight every morning before given 90 tablet 3   gabapentin (NEURONTIN) 100 MG capsule Take 1 capsule (100 mg total) by mouth at bedtime. 90 capsule 2   levothyroxine (SYNTHROID) 50 MCG tablet Take 1 tablet (50 mcg total) by mouth daily before breakfast. 90 tablet 3   mirabegron ER (MYRBETRIQ) 50 MG TB24 tablet Take 1 tablet (50 mg total) by mouth daily. 30 tablet 11   Multiple Vitamins-Minerals (MULTIVITAMIN PO) Take 1 tablet by mouth daily. Gummie     OXYGEN Inhale 2 L/min into the lungs See admin instructions. Inhale 2 L/min of oxygen  at bedtime and during the day as needed for shortness of breath     pantoprazole (PROTONIX) 40 MG tablet TAKE 1 TABLET EVERY DAY AS NEEDED 90 tablet 3   Potassium Chloride ER 20 MEQ TBCR TAKE 2 TABLETS BY MOUTH AS NEEDED (WHENEVER  YOU  TAKE  LASIX) 180 tablet 2   sertraline (ZOLOFT) 100 MG tablet Take 1 tablet (100 mg total) by mouth daily. 90 tablet 2   vitamin C (ASCORBIC ACID) 500 MG tablet Take 500 mg by mouth daily.     Melatonin 5 MG CHEW Chew 15 mg by mouth daily.     traMADol (ULTRAM) 50 MG tablet Take 1 tablet (50 mg total) by mouth 2 (two) times daily as needed for moderate pain. 20 tablet 0   traZODone (DESYREL) 100 MG tablet Take 1 tablet (100 mg total) by mouth at bedtime. 90 tablet 2   ezetimibe (ZETIA) 10 MG tablet TAKE 1 TABLET (10 MG TOTAL) BY MOUTH DAILY. (Patient not taking: Reported on 06/02/2021) 90 tablet 0   zinc gluconate 50 MG tablet Take 50 mg by mouth daily.     No facility-administered medications prior to visit.     Per HPI unless specifically indicated in ROS section below Review of Systems  Objective:  BP 126/80   Pulse 64   Temp 98.2 F (36.8 C) (Temporal)   Ht 4\' 11"  (1.499 m)   Wt 183 lb 5 oz (83.2 kg)   SpO2 98%   BMI 37.02 kg/m   Wt Readings from Last 3 Encounters:  06/02/21 183 lb 5 oz (83.2 kg)  04/02/21 175 lb (79.4 kg)  03/15/21 180 lb (81.6 kg)      Physical Exam Vitals and nursing note reviewed.  Constitutional:      Appearance: Normal appearance. She is not ill-appearing.  Cardiovascular:     Rate and Rhythm: Normal rate and regular rhythm.     Pulses: Normal pulses.     Heart sounds: Normal heart sounds. No murmur heard. Pulmonary:     Effort: Pulmonary effort is normal. No respiratory distress.     Breath sounds: Normal breath sounds. No wheezing, rhonchi or rales.  Skin:    General: Skin is warm and dry.     Findings: No rash.  Neurological:     Mental Status: She is alert.     Comments:  Mild action tremor noticed with  hands outstretched, no resting tremor Slowed gait without shuffling No cogwheel rigidity Slight masked facies       Results for orders placed or performed in visit on 06/02/21  TSH  Result Value Ref Range   TSH 2.40 0.35 - 5.50 uIU/mL  T4, free  Result Value Ref Range   Free T4 0.88 0.60 -  1.60 ng/dL  Vitamin Y24  Result Value Ref Range   Vitamin B-12 464 211 - 911 pg/mL   Lab Results  Component Value Date   CREATININE 0.98 04/02/2021   BUN 23 04/02/2021   NA 138 04/02/2021   K 4.3 04/02/2021   CL 101 04/02/2021   CO2 30 04/02/2021    Lab Results  Component Value Date   WBC 7.0 04/02/2021   HGB 13.3 04/02/2021   HCT 42.7 04/02/2021   MCV 97.9 04/02/2021   PLT 172 04/02/2021    Depression screen PHQ 2/9 06/02/2021 08/12/2020 12/31/2019 07/08/2019 06/27/2018  Decreased Interest 2 0 0 0 0  Down, Depressed, Hopeless 3 0 0 0 2  PHQ - 2 Score 5 0 0 0 2  Altered sleeping 3 2 - 3 0  Tired, decreased energy 3 3 - 1 1  Change in appetite 0 0 - 0 0  Feeling bad or failure about yourself  3 0 - 0 1  Trouble concentrating 0 1 - 0 0  Moving slowly or fidgety/restless 3 0 - 0 0  Suicidal thoughts 2 0 - 0 0  PHQ-9 Score 19 6 - 4 4  Difficult doing work/chores - - - - Not difficult at all  Some recent data might be hidden    GAD 7 : Generalized Anxiety Score 06/02/2021 08/12/2020  Nervous, Anxious, on Edge 2 0  Control/stop worrying 1 0  Worry too much - different things 1 0  Trouble relaxing 0 0  Restless 0 0  Easily annoyed or irritable 2 0  Afraid - awful might happen 1 0  Total GAD 7 Score 7 0   Assessment & Plan:  This visit occurred during the SARS-CoV-2 public health emergency.  Safety protocols were in place, including screening questions prior to the visit, additional usage of staff PPE, and extensive cleaning of exam room while observing appropriate contact time as indicated for disinfecting solutions.   Problem List Items Addressed This Visit     COPD (chronic  obstructive pulmonary disease) (HCC)    Chronic, only on PRN albuterol.  Off advair due to cost - and noted worsening dyspnea which may be contributing to anhedonia and chronic fatigue - recommend restart advair which I have sent in to her pharmacy.       Relevant Medications   fluticasone-salmeterol (ADVAIR HFA) 115-21 MCG/ACT inhaler   Other Relevant Orders   Ambulatory referral to Physical Therapy   MDD (major depressive disorder), recurrent episode, moderate (HCC) - Primary    Chronic, deteriorated despite current regimen of sertraline 100mg , trazodone 100mg . Daughter notes worsening difficulty with anhedonia, planning transition to move in with which she feels will be beneficial as she will be more active.  Check labs to r/o reversible causes of fatigue, worsening depression. Will start by dropping traozdone dose, with option to add another adjuvant antidepressant (?wellbutrin).  Clarified endorsed SI to be passive, no active thoughts or plan. Contracts for safety.       Relevant Medications   traZODone (DESYREL) 50 MG tablet   Recurrent falls    Will refer to outpatient physical therapy per daughter request. Pt agrees to try this.       Relevant Orders   Ambulatory referral to Physical Therapy   Hypothyroidism (acquired)    Update thyroid levels with recent increase in fatigue.       Relevant Orders   TSH (Completed)   T4, free (Completed)   Chronic respiratory failure with  hypoxia (HCC)    Restart advair. She continues nightly oxygen and PRN during the day. Presents today without supplemental oxygen use.       Relevant Orders   Ambulatory referral to Physical Therapy   Shortness of breath   Debility    Dependent in ADL/IADLs.  ?how much depression is contributing.  Will refer to outpt PT as well as work towards better mood control.       Neuropathy    H/o this, has been on low dose nightly gabapentin.  No significant neurpathy at this time - will trial off  gabapentin.       Other Visit Diagnoses     Need for influenza vaccination       Relevant Orders   Flu Vaccine QUAD High Dose(Fluad) (Completed)   Fatigue, unspecified type       Relevant Orders   Vitamin B12 (Completed)        Meds ordered this encounter  Medications   Melatonin 5 MG CHEW    Sig: Chew 10 mg by mouth daily.   traZODone (DESYREL) 50 MG tablet    Sig: Take 1 tablet (50 mg total) by mouth at bedtime.    Dispense:  90 tablet    Refill:  1   fluticasone-salmeterol (ADVAIR HFA) 115-21 MCG/ACT inhaler    Sig: Inhale 2 puffs into the lungs 2 (two) times daily.    Dispense:  1 each    Refill:  12   traMADol (ULTRAM) 50 MG tablet    Sig: Take 1 tablet (50 mg total) by mouth 2 (two) times daily as needed for moderate pain.    Dispense:  20 tablet    Refill:  0    Orders Placed This Encounter  Procedures   Flu Vaccine QUAD High Dose(Fluad)   TSH   T4, free   Vitamin B12   Ambulatory referral to Physical Therapy    Referral Priority:   Routine    Referral Type:   Physical Medicine    Referral Reason:   Specialty Services Required    Requested Specialty:   Physical Therapy    Number of Visits Requested:   1     Patient Instructions  Flu shot today  Labs today  Ok to continue melatonin.  Trial off gabapentin (see how you do, ok to restart if needed).  Try lower dose trazodone (50mg  sent to pharmacy).  Restart advair inhaler 2 puffs twice daily. This should help breathing.  We will refer you to physical therapy.  Update me in 2-3 weeks with how you're doing. We may add second mood medicine in addition to sertraline.   Follow up plan: Return if symptoms worsen or fail to improve.  , MD

## 2021-06-03 LAB — T4, FREE: Free T4: 0.88 ng/dL (ref 0.60–1.60)

## 2021-06-03 LAB — TSH: TSH: 2.4 u[IU]/mL (ref 0.35–5.50)

## 2021-06-03 LAB — VITAMIN B12: Vitamin B-12: 464 pg/mL (ref 211–911)

## 2021-06-05 ENCOUNTER — Encounter: Payer: Self-pay | Admitting: Family Medicine

## 2021-06-05 NOTE — Assessment & Plan Note (Signed)
H/o this, has been on low dose nightly gabapentin.  No significant neurpathy at this time - will trial off gabapentin.

## 2021-06-05 NOTE — Assessment & Plan Note (Addendum)
Chronic, deteriorated despite current regimen of sertraline 100mg , trazodone 100mg . Daughter notes worsening difficulty with anhedonia, planning transition to move in with which she feels will be beneficial as she will be more active.  Check labs to r/o reversible causes of fatigue, worsening depression. Will start by dropping traozdone dose, with option to add another adjuvant antidepressant (?wellbutrin).  Clarified endorsed SI to be passive, no active thoughts or plan. Contracts for safety.

## 2021-06-05 NOTE — Assessment & Plan Note (Signed)
Will refer to outpatient physical therapy per daughter request. Pt agrees to try this.

## 2021-06-05 NOTE — Assessment & Plan Note (Addendum)
Restart advair. She continues nightly oxygen and PRN during the day. Presents today without supplemental oxygen use.

## 2021-06-05 NOTE — Assessment & Plan Note (Signed)
Dependent in ADL/IADLs.  ?how much depression is contributing.  Will refer to outpt PT as well as work towards better mood control.

## 2021-06-05 NOTE — Assessment & Plan Note (Signed)
Chronic, only on PRN albuterol.  Off advair due to cost - and noted worsening dyspnea which may be contributing to anhedonia and chronic fatigue - recommend restart advair which I have sent in to her pharmacy.

## 2021-06-05 NOTE — Assessment & Plan Note (Signed)
Update thyroid levels with recent increase in fatigue.

## 2021-06-08 ENCOUNTER — Telehealth: Payer: Self-pay

## 2021-06-08 NOTE — Chronic Care Management (AMB) (Addendum)
Chronic Care Management Pharmacy Assistant   Name: [name] [name]  MRN: [id number] DOB: 05/23/1946   Reason for Encounter: Reminder Call   Conditions to be addressed/monitored: HTN, HLD, COPD, and CKD Stage 3   Medications: Outpatient Encounter Medications as of 06/08/2021  Medication Sig Note   acetaminophen (TYLENOL) 500 MG tablet Take 1 tablet (500 mg total) by mouth every 6 (six) hours as needed for headache.    albuterol (VENTOLIN HFA) 108 (90 Base) MCG/ACT inhaler INHALE 2 PUFFS INTO THE LUNGS EVERY 6 (SIX) HOURS AS NEEDED FOR WHEEZING OR SHORTNESS OF BREATH.    alendronate (FOSAMAX) 70 MG tablet Take with a full glass of water on an empty stomach.    aspirin (ASPIRIN 81) 81 MG EC tablet Take 1 tablet (81 mg total) by mouth daily. Swallow whole.    atorvastatin (LIPITOR) 40 MG tablet Take 1 tablet (40 mg total) by mouth daily.    Cholecalciferol (VITAMIN D) 50 MCG (2000 UT) tablet Take 2,000 Units by mouth daily.    clopidogrel (PLAVIX) 75 MG tablet Take 1 tablet (75 mg total) by mouth daily.    Cranberry-Vitamin C (AZO CRANBERRY URINARY TRACT PO) Take 1 tablet by mouth in the morning and at bedtime.  03/26/2021: Pt ran out, needs to get more   estradiol (ESTRACE) 0.1 MG/GM vaginal cream Estrogen Cream Instruction Discard applicator Apply pea sized amount to tip of finger to urethra before bed. Wash hands well after application. Use Monday, Wednesday and Friday    ezetimibe (ZETIA) 10 MG tablet TAKE 1 TABLET (10 MG TOTAL) BY MOUTH DAILY. (Patient not taking: Reported on 06/02/2021)    fluticasone-salmeterol (ADVAIR HFA) 115-21 MCG/ACT inhaler Inhale 2 puffs into the lungs 2 (two) times daily.    furosemide (LASIX) 20 MG tablet Take 1 tablet (20 mg total) by mouth daily as needed for fluid. Weight every morning before given    gabapentin (NEURONTIN) 100 MG capsule Take 1 capsule (100 mg total) by mouth at bedtime.    levothyroxine (SYNTHROID) 50 MCG tablet Take 1 tablet (50 mcg  total) by mouth daily before breakfast.    Melatonin 5 MG CHEW Chew 10 mg by mouth daily.    mirabegron ER (MYRBETRIQ) 50 MG TB24 tablet Take 1 tablet (50 mg total) by mouth daily.    Multiple Vitamins-Minerals (MULTIVITAMIN PO) Take 1 tablet by mouth daily. Gummie 03/26/2021: Pt ran out, needs to get more   OXYGEN Inhale 2 L/min into the lungs See admin instructions. Inhale 2 L/min of oxygen at bedtime and during the day as needed for shortness of breath    pantoprazole (PROTONIX) 40 MG tablet TAKE 1 TABLET EVERY DAY AS NEEDED    Potassium Chloride ER 20 MEQ TBCR TAKE 2 TABLETS BY MOUTH AS NEEDED (WHENEVER  YOU  TAKE  LASIX)    sertraline (ZOLOFT) 100 MG tablet Take 1 tablet (100 mg total) by mouth daily.    traMADol (ULTRAM) 50 MG tablet Take 1 tablet (50 mg total) by mouth 2 (two) times daily as needed for moderate pain.    traZODone (DESYREL) 50 MG tablet Take 1 tablet (50 mg total) by mouth at bedtime.    vitamin C (ASCORBIC ACID) 500 MG tablet Take 500 mg by mouth daily.    No facility-administered encounter medications on file as of 06/08/2021.   [name] [name] was contacted to remind her of her upcoming telephone visit with Phil Dopp on 07/05/2021 at 3:30pm. Patient was reminded to  have all medications, supplements and any blood glucose and blood pressure readings available for review at appointment.   Are you having any problems with your medications? No  Do you have any concerns you like to discuss with the pharmacist? No   Star Rating Drugs: Medication:  Last Fill: Day Supply Atorvastatin 40mg  04/27/21  90  06/27/21, CPP notified  Phil Dopp, [facility] Clin[facility] Pharmacy Assistant [phone]  I have reviewed the care management and care coordination activities outlined in this encounter and I am certifying that I agree with the content of this note. No further action required.  [phone], PharmD Clinical Pharmacist [facility] at [facility] [phone]

## 2021-06-15 ENCOUNTER — Telehealth: Payer: Self-pay

## 2021-06-17 ENCOUNTER — Telehealth: Payer: Medicare HMO

## 2021-06-17 DIAGNOSIS — R269 Unspecified abnormalities of gait and mobility: Secondary | ICD-10-CM | POA: Diagnosis not present

## 2021-06-17 DIAGNOSIS — J449 Chronic obstructive pulmonary disease, unspecified: Secondary | ICD-10-CM | POA: Diagnosis not present

## 2021-06-19 ENCOUNTER — Other Ambulatory Visit: Payer: Self-pay | Admitting: Family Medicine

## 2021-06-19 ENCOUNTER — Other Ambulatory Visit: Payer: Self-pay | Admitting: Cardiovascular Disease

## 2021-06-21 ENCOUNTER — Telehealth: Payer: Self-pay | Admitting: Family Medicine

## 2021-06-21 DIAGNOSIS — R2689 Other abnormalities of gait and mobility: Secondary | ICD-10-CM | POA: Diagnosis not present

## 2021-06-21 DIAGNOSIS — M6281 Muscle weakness (generalized): Secondary | ICD-10-CM | POA: Diagnosis not present

## 2021-06-21 MED ORDER — GABAPENTIN 100 MG PO CAPS: 100.0000 mg | ORAL_CAPSULE | Freq: Every day | ORAL | 2 refills | Status: DC

## 2021-06-21 MED ORDER — POTASSIUM CHLORIDE ER 20 MEQ PO TBCR: 1.0000 | EXTENDED_RELEASE_TABLET | Freq: Two times a day (BID) | ORAL | 2 refills | Status: AC | PRN

## 2021-06-21 NOTE — Telephone Encounter (Signed)
Plavix Last filled:  05/13/21, #90 Last OV:  06/02/21, MDD f/u Next OV:  08/16/21, AWV

## 2021-06-21 NOTE — Telephone Encounter (Signed)
Last office visit 06/02/21 Gabapentin last refill 12/17/20 #90/2 Potassium last refill 11/07/19 #180/2

## 2021-06-21 NOTE — Addendum Note (Signed)
Addended by: Sydell Axon C on: 06/21/2021 04:22 PM   Modules accepted: Orders

## 2021-06-21 NOTE — Addendum Note (Signed)
Addended by: [name] on: 06/21/2021 06:50 PM   Modules accepted: Orders

## 2021-06-21 NOTE — Telephone Encounter (Signed)
ERx to [facility]

## 2021-06-21 NOTE — Telephone Encounter (Signed)
[name] [name] called with update regarding patient going off of Gabapentin. She states [name] [name] is having issues being off of it and is wondering if a refill can be sent to [facility].   She is also requesting a refill on the potassium to be sent over to [facility] order pharmacy.

## 2021-06-23 ENCOUNTER — Telehealth: Payer: Self-pay

## 2021-06-23 DIAGNOSIS — R2689 Other abnormalities of gait and mobility: Secondary | ICD-10-CM | POA: Diagnosis not present

## 2021-06-23 DIAGNOSIS — I5032 Chronic diastolic (congestive) heart failure: Secondary | ICD-10-CM

## 2021-06-23 DIAGNOSIS — M6281 Muscle weakness (generalized): Secondary | ICD-10-CM | POA: Diagnosis not present

## 2021-06-23 DIAGNOSIS — J9611 Chronic respiratory failure with hypoxia: Secondary | ICD-10-CM

## 2021-06-23 DIAGNOSIS — J432 Centrilobular emphysema: Secondary | ICD-10-CM

## 2021-06-23 NOTE — Telephone Encounter (Signed)
[name], patient's daughter, called stating patient has been trying to get portable Oxygen for a year now and they recently got a call stating patient can get it but the original RX is outdated and they need an updated Oxygen RX order for Portable Oxygen-through [facility]- to be faxed: [phone].  Please let [name] know when this has been done. Thank you

## 2021-06-23 NOTE — Telephone Encounter (Signed)
Received response from Colletta Maryland at [facility] confirming they received the order.

## 2021-06-23 NOTE — Telephone Encounter (Signed)
I've placed order via [facility] for portable oxygen concentrator for home use.  Plz let me know if I need to do anything differently

## 2021-06-23 NOTE — Telephone Encounter (Signed)
I sent a Community msg to [facility] notifying them of order for pt.  Asked them to let us know if anything else is needed.

## 2021-06-29 ENCOUNTER — Telehealth: Payer: Self-pay

## 2021-06-29 NOTE — Progress Notes (Signed)
Chronic Care Management Pharmacy Assistant   Name: FAWNDA VITULLO  MRN: [id number] DOB: 07/08/1946  Reason for Encounter: Reminder Call   Medications: Outpatient Encounter Medications as of 06/29/2021  Medication Sig Note   acetaminophen (TYLENOL) 500 MG tablet Take 1 tablet (500 mg total) by mouth every 6 (six) hours as needed for headache.    albuterol (VENTOLIN HFA) 108 (90 Base) MCG/ACT inhaler INHALE 2 PUFFS INTO THE LUNGS EVERY 6 (SIX) HOURS AS NEEDED FOR WHEEZING OR SHORTNESS OF BREATH.    alendronate (FOSAMAX) 70 MG tablet Take with a full glass of water on an empty stomach.    aspirin (ASPIRIN 81) 81 MG EC tablet Take 1 tablet (81 mg total) by mouth daily. Swallow whole.    atorvastatin (LIPITOR) 40 MG tablet Take 1 tablet (40 mg total) by mouth daily.    Cholecalciferol (VITAMIN D) 50 MCG (2000 UT) tablet Take 2,000 Units by mouth daily.    clopidogrel (PLAVIX) 75 MG tablet TAKE 1 TABLET (75 MG TOTAL) BY MOUTH DAILY.    Cranberry-Vitamin C (AZO CRANBERRY URINARY TRACT PO) Take 1 tablet by mouth in the morning and at bedtime.  03/26/2021: Pt ran out, needs to get more   estradiol (ESTRACE) 0.1 MG/GM vaginal cream Estrogen Cream Instruction Discard applicator Apply pea sized amount to tip of finger to urethra before bed. Wash hands well after application. Use Monday, Wednesday and Friday    ezetimibe (ZETIA) 10 MG tablet TAKE 1 TABLET (10 MG TOTAL) BY MOUTH DAILY.    fluticasone-salmeterol (ADVAIR HFA) 115-21 MCG/ACT inhaler Inhale 2 puffs into the lungs 2 (two) times daily.    furosemide (LASIX) 20 MG tablet Take 1 tablet (20 mg total) by mouth daily as needed for fluid. Weight every morning before given    gabapentin (NEURONTIN) 100 MG capsule Take 1 capsule (100 mg total) by mouth at bedtime.    levothyroxine (SYNTHROID) 50 MCG tablet TAKE 1 TABLET EVERY DAY BEFORE BREAKFAST    Melatonin 5 MG CHEW Chew 10 mg by mouth daily.    mirabegron ER (MYRBETRIQ) 50 MG TB24 tablet Take  1 tablet (50 mg total) by mouth daily.    Multiple Vitamins-Minerals (MULTIVITAMIN PO) Take 1 tablet by mouth daily. Gummie 03/26/2021: Pt ran out, needs to get more   OXYGEN Inhale 2 L/min into the lungs See admin instructions. Inhale 2 L/min of oxygen at bedtime and during the day as needed for shortness of breath    pantoprazole (PROTONIX) 40 MG tablet TAKE 1 TABLET EVERY DAY AS NEEDED    Potassium Chloride ER 20 MEQ TBCR Take 1 tablet by mouth 2 (two) times daily as needed (with lasix use).    sertraline (ZOLOFT) 100 MG tablet Take 1 tablet (100 mg total) by mouth daily.    traMADol (ULTRAM) 50 MG tablet Take 1 tablet (50 mg total) by mouth 2 (two) times daily as needed for moderate pain.    traZODone (DESYREL) 50 MG tablet Take 1 tablet (50 mg total) by mouth at bedtime.    vitamin C (ASCORBIC ACID) 500 MG tablet Take 500 mg by mouth daily.    No facility-administered encounter medications on file as of 06/29/2021.   CHIANTI GOH was contacted to remind her of her upcoming telephone visit with Phil Dopp on 07/05/2021 at 3:30 pm. Patient was reminded to have all medications, supplements and any blood glucose and blood pressure readings available for review at appointment.  Are you having any problems  with your medications? No  Do you have any concerns you like to discuss with the pharmacist? No  Star Rating Drugs: Medication:  Last Fill: Day Supply Atorvastatin 40mg     04/27/21              90  06/27/21, CPP notified  Phil Dopp, Claudina Lick Clinical Pharmacy Assistant [phone]  Time Spent: 10 minutes

## 2021-06-30 DIAGNOSIS — M6281 Muscle weakness (generalized): Secondary | ICD-10-CM | POA: Diagnosis not present

## 2021-06-30 DIAGNOSIS — R2689 Other abnormalities of gait and mobility: Secondary | ICD-10-CM | POA: Diagnosis not present

## 2021-07-05 ENCOUNTER — Other Ambulatory Visit: Payer: Self-pay

## 2021-07-05 ENCOUNTER — Ambulatory Visit: Payer: Medicare HMO

## 2021-07-05 DIAGNOSIS — M6281 Muscle weakness (generalized): Secondary | ICD-10-CM | POA: Diagnosis not present

## 2021-07-05 DIAGNOSIS — R2689 Other abnormalities of gait and mobility: Secondary | ICD-10-CM | POA: Diagnosis not present

## 2021-07-05 DIAGNOSIS — M8000XA Age-related osteoporosis with current pathological fracture, unspecified site, initial encounter for fracture: Secondary | ICD-10-CM

## 2021-07-05 DIAGNOSIS — E782 Mixed hyperlipidemia: Secondary | ICD-10-CM

## 2021-07-05 NOTE — Progress Notes (Signed)
 Chronic Care Management Pharmacy Note  07/05/2021 Name:  [name] [name] MRN:  [id number] DOB:  01/19/1946  Summary: Pt reports doing a lot better since recent PCP visit. She moved in with her daughter, Tonya, and is getting a lot of exercise. She started PT for balance and this is going well. SOB has improved since restarting Advair. No longer using oxygen during the day.  Oxygen stays around 98%. Mood has improved some as well. No problems with decreased dose trazodone. She restarted gabapentin due to pain during trial off. No medication/chronic health concerns today.  Recommendations: Follow up with PCP as planned  Plan: CCM - 12 months  Subjective: [name] [name] is an 74 y.o. year old female who is a primary patient of Gutierrez, Javier, MD.  The CCM team was consulted for assistance with disease management and care coordination needs.    Engaged with patient by telephone for follow up visit in response to provider referral for pharmacy case management and/or care coordination services.   Consent to Services:  The patient was given information about Chronic Care Management services, agreed to services, and gave verbal consent prior to initiation of services.  Please see initial visit note for detailed documentation.   Patient Care Team: Gutierrez, Javier, MD as PCP - General (Family Medicine) Gollan, Timothy J, MD as PCP - Cardiology (Cardiology) , , RPH as Pharmacist (Pharmacist)  Recent office visits: 05/28/21 - Gutierrez - Trial off gabapentin (see how you do, ok to restart if needed). Try lower dose trazodone (50mg sent to pharmacy). Restart advair inhaler 2 puffs twice daily. This should help breathing. We will refer you to physical therapy. Update me in 2-3 weeks with how you're doing. We may add second mood medicine in addition to sertraline.  08/12/20 - Gutierrez - Continue current medications, RTC 6 months  Recent consult visits: 12/03/20 - Urology -  Urge incontinence is most completely resolved.  We also have her on topical estrogen cream with her history of recurrent UTIs.  Continue Myrbetriq 50 mg daily with coupon. RTC 1 year.  11/05/20 - Urology - OAB - Started 6 months UTI prophylaxis August 2021 nitrofurantoin 50 mg daily. She stopped this last week unclear reason. Macrobid 100 mg twice daily x5 days for suspected UTI, call with UA and culture results. Start topical estrogen cream for history of recurrent UTIs. Trial of Myrbetriq 50 mg daily for OAB. 09/11/20 - Cardiology - She remains on statin and Zetia therapy with an LDL of sixty-three in November.  She is also made significant dietary adjustments. Off antihypertensives, stable. CHpEF, stable, no recent furosemide use. PAD/CAD stable on aspirin, Plavix, statin. Prior stroke, no new symptoms, COPD, oxygen PRN, no wheezing on exam, RTC 6 months.   Hospital visits: None in previous 6 months  Objective:  Lab Results  Component Value Date   CREATININE 0.98 04/02/2021   BUN 23 04/02/2021   GFR 49.72 (L) 08/05/2020   GFRNONAA >60 04/02/2021   GFRAA >60 06/26/2020   NA 138 04/02/2021   K 4.3 04/02/2021   CALCIUM 9.5 04/02/2021   CO2 30 04/02/2021   GLUCOSE 99 04/02/2021    Lab Results  Component Value Date/Time   HGBA1C 5.1 07/31/2018 02:17 AM   HGBA1C  09/30/2008 10:35 PM    5.6 (NOTE)   The ADA recommends the following therapeutic goal for glycemic   control related to Hgb A1C measurement:   Goal of Therapy:   < 7.0% Hgb A1C     Chronic Care Management Pharmacy Note  07/05/2021 Name:  CHAUNICE OBIE MRN:  [id number] DOB:  06/11/1946  Summary: Pt reports doing a lot better since recent PCP visit. She moved in with her daughter, [name], and is getting a lot of exercise. She started PT for balance and this is going well. SOB has improved since restarting Advair. No longer using oxygen during the day.  Oxygen stays around 98%. Mood has improved some as well. No problems with decreased dose trazodone. She restarted gabapentin due to pain during trial off. No medication/chronic health concerns today.  Recommendations: Follow up with PCP as planned  Plan: CCM - 12 months  Subjective: [name] [name] is an 75 y.o. year old female who is a primary patient of Ria Bush, MD.  The CCM team was consulted for assistance with disease management and care coordination needs.    Engaged with patient by telephone for follow up visit in response to provider referral for pharmacy case management and/or care coordination services.   Consent to Services:  The patient was given information about Chronic Care Management services, agreed to services, and gave verbal consent prior to initiation of services.  Please see initial visit note for detailed documentation.   Patient Care Team: Ria Bush, MD as PCP - General (Family Medicine) Rockey Situ Kathlene November, MD as PCP - Cardiology (Cardiology) Debbora Dus, [facility] as Pharmacist (Pharmacist)  Recent office visits: 05/28/21 - Danise Mina - Trial off gabapentin (see how you do, ok to restart if needed). Try lower dose trazodone (77m sent to pharmacy). Restart advair inhaler 2 puffs twice daily. This should help breathing. We will refer you to physical therapy. Update me in 2-3 weeks with how you're doing. We may add second mood medicine in addition to sertraline.  08/12/20 -Danise Mina- Continue current medications, RTC 6 months  Recent consult visits: 12/03/20 - Urology -  Urge incontinence is most completely resolved.  We also have her on topical estrogen cream with her history of recurrent UTIs.  Continue Myrbetriq 50 mg daily with coupon. RTC 1 year.  11/05/20 - Urology - OAB - Started 6 months UTI prophylaxis August 2021 nitrofurantoin 50 mg daily. She stopped this last week unclear reason. Macrobid 100 mg twice daily x5 days for suspected UTI, call with UA and culture results. Start topical estrogen cream for history of recurrent UTIs. Trial of Myrbetriq 50 mg daily for OAB. 09/11/20 - Cardiology - She remains on statin and Zetia therapy with an LDL of sixty-three in November.  She is also made significant dietary adjustments. Off antihypertensives, stable. CHpEF, stable, no recent furosemide use. PAD/CAD stable on aspirin, Plavix, statin. Prior stroke, no new symptoms, COPD, oxygen PRN, no wheezing on exam, RTC 6 months.   Hospital visits: None in previous 6 months  Objective:  Lab Results  Component Value Date   CREATININE 0.98 04/02/2021   BUN 23 04/02/2021   GFR 49.72 (L) 08/05/2020   GFRNONAA >60 04/02/2021   GFRAA >60 06/26/2020   NA 138 04/02/2021   K 4.3 04/02/2021   CALCIUM 9.5 04/02/2021   CO2 30 04/02/2021   GLUCOSE 99 04/02/2021    Lab Results  Component Value Date/Time   HGBA1C 5.1 07/31/2018 02:17 AM   HGBA1C  09/30/2008 10:35 PM    5.6 (NOTE)   The ADA recommends the following therapeutic goal for glycemic   control related to Hgb A1C measurement:   Goal of Therapy:   < 7.0% Hgb A1C   Chronic Care Management Pharmacy Note  07/05/2021 Name:  [name] [name] MRN:  [id number] DOB:  01/19/1946  Summary: Pt reports doing a lot better since recent PCP visit. She moved in with her daughter, Tonya, and is getting a lot of exercise. She started PT for balance and this is going well. SOB has improved since restarting Advair. No longer using oxygen during the day.  Oxygen stays around 98%. Mood has improved some as well. No problems with decreased dose trazodone. She restarted gabapentin due to pain during trial off. No medication/chronic health concerns today.  Recommendations: Follow up with PCP as planned  Plan: CCM - 12 months  Subjective: [name] [name] is an 74 y.o. year old female who is a primary patient of Gutierrez, Javier, MD.  The CCM team was consulted for assistance with disease management and care coordination needs.    Engaged with patient by telephone for follow up visit in response to provider referral for pharmacy case management and/or care coordination services.   Consent to Services:  The patient was given information about Chronic Care Management services, agreed to services, and gave verbal consent prior to initiation of services.  Please see initial visit note for detailed documentation.   Patient Care Team: Gutierrez, Javier, MD as PCP - General (Family Medicine) Gollan, Timothy J, MD as PCP - Cardiology (Cardiology) , , RPH as Pharmacist (Pharmacist)  Recent office visits: 05/28/21 - Gutierrez - Trial off gabapentin (see how you do, ok to restart if needed). Try lower dose trazodone (50mg sent to pharmacy). Restart advair inhaler 2 puffs twice daily. This should help breathing. We will refer you to physical therapy. Update me in 2-3 weeks with how you're doing. We may add second mood medicine in addition to sertraline.  08/12/20 - Gutierrez - Continue current medications, RTC 6 months  Recent consult visits: 12/03/20 - Urology -  Urge incontinence is most completely resolved.  We also have her on topical estrogen cream with her history of recurrent UTIs.  Continue Myrbetriq 50 mg daily with coupon. RTC 1 year.  11/05/20 - Urology - OAB - Started 6 months UTI prophylaxis August 2021 nitrofurantoin 50 mg daily. She stopped this last week unclear reason. Macrobid 100 mg twice daily x5 days for suspected UTI, call with UA and culture results. Start topical estrogen cream for history of recurrent UTIs. Trial of Myrbetriq 50 mg daily for OAB. 09/11/20 - Cardiology - She remains on statin and Zetia therapy with an LDL of sixty-three in November.  She is also made significant dietary adjustments. Off antihypertensives, stable. CHpEF, stable, no recent furosemide use. PAD/CAD stable on aspirin, Plavix, statin. Prior stroke, no new symptoms, COPD, oxygen PRN, no wheezing on exam, RTC 6 months.   Hospital visits: None in previous 6 months  Objective:  Lab Results  Component Value Date   CREATININE 0.98 04/02/2021   BUN 23 04/02/2021   GFR 49.72 (L) 08/05/2020   GFRNONAA >60 04/02/2021   GFRAA >60 06/26/2020   NA 138 04/02/2021   K 4.3 04/02/2021   CALCIUM 9.5 04/02/2021   CO2 30 04/02/2021   GLUCOSE 99 04/02/2021    Lab Results  Component Value Date/Time   HGBA1C 5.1 07/31/2018 02:17 AM   HGBA1C  09/30/2008 10:35 PM    5.6 (NOTE)   The ADA recommends the following therapeutic goal for glycemic   control related to Hgb A1C measurement:   Goal of Therapy:   < 7.0% Hgb A1C      Chronic Care Management Pharmacy Note  07/05/2021 Name:  [name] [name] MRN:  [id number] DOB:  01/19/1946  Summary: Pt reports doing a lot better since recent PCP visit. She moved in with her daughter, Tonya, and is getting a lot of exercise. She started PT for balance and this is going well. SOB has improved since restarting Advair. No longer using oxygen during the day.  Oxygen stays around 98%. Mood has improved some as well. No problems with decreased dose trazodone. She restarted gabapentin due to pain during trial off. No medication/chronic health concerns today.  Recommendations: Follow up with PCP as planned  Plan: CCM - 12 months  Subjective: [name] [name] is an 74 y.o. year old female who is a primary patient of Gutierrez, Javier, MD.  The CCM team was consulted for assistance with disease management and care coordination needs.    Engaged with patient by telephone for follow up visit in response to provider referral for pharmacy case management and/or care coordination services.   Consent to Services:  The patient was given information about Chronic Care Management services, agreed to services, and gave verbal consent prior to initiation of services.  Please see initial visit note for detailed documentation.   Patient Care Team: Gutierrez, Javier, MD as PCP - General (Family Medicine) Gollan, Timothy J, MD as PCP - Cardiology (Cardiology) , , RPH as Pharmacist (Pharmacist)  Recent office visits: 05/28/21 - Gutierrez - Trial off gabapentin (see how you do, ok to restart if needed). Try lower dose trazodone (50mg sent to pharmacy). Restart advair inhaler 2 puffs twice daily. This should help breathing. We will refer you to physical therapy. Update me in 2-3 weeks with how you're doing. We may add second mood medicine in addition to sertraline.  08/12/20 - Gutierrez - Continue current medications, RTC 6 months  Recent consult visits: 12/03/20 - Urology -  Urge incontinence is most completely resolved.  We also have her on topical estrogen cream with her history of recurrent UTIs.  Continue Myrbetriq 50 mg daily with coupon. RTC 1 year.  11/05/20 - Urology - OAB - Started 6 months UTI prophylaxis August 2021 nitrofurantoin 50 mg daily. She stopped this last week unclear reason. Macrobid 100 mg twice daily x5 days for suspected UTI, call with UA and culture results. Start topical estrogen cream for history of recurrent UTIs. Trial of Myrbetriq 50 mg daily for OAB. 09/11/20 - Cardiology - She remains on statin and Zetia therapy with an LDL of sixty-three in November.  She is also made significant dietary adjustments. Off antihypertensives, stable. CHpEF, stable, no recent furosemide use. PAD/CAD stable on aspirin, Plavix, statin. Prior stroke, no new symptoms, COPD, oxygen PRN, no wheezing on exam, RTC 6 months.   Hospital visits: None in previous 6 months  Objective:  Lab Results  Component Value Date   CREATININE 0.98 04/02/2021   BUN 23 04/02/2021   GFR 49.72 (L) 08/05/2020   GFRNONAA >60 04/02/2021   GFRAA >60 06/26/2020   NA 138 04/02/2021   K 4.3 04/02/2021   CALCIUM 9.5 04/02/2021   CO2 30 04/02/2021   GLUCOSE 99 04/02/2021    Lab Results  Component Value Date/Time   HGBA1C 5.1 07/31/2018 02:17 AM   HGBA1C  09/30/2008 10:35 PM    5.6 (NOTE)   The ADA recommends the following therapeutic goal for glycemic   control related to Hgb A1C measurement:   Goal of Therapy:   < 7.0% Hgb A1C      Chronic Care Management Pharmacy Note  07/05/2021 Name:  [name] [name] MRN:  [id number] DOB:  01/19/1946  Summary: Pt reports doing a lot better since recent PCP visit. She moved in with her daughter, Tonya, and is getting a lot of exercise. She started PT for balance and this is going well. SOB has improved since restarting Advair. No longer using oxygen during the day.  Oxygen stays around 98%. Mood has improved some as well. No problems with decreased dose trazodone. She restarted gabapentin due to pain during trial off. No medication/chronic health concerns today.  Recommendations: Follow up with PCP as planned  Plan: CCM - 12 months  Subjective: [name] [name] is an 74 y.o. year old female who is a primary patient of Gutierrez, Javier, MD.  The CCM team was consulted for assistance with disease management and care coordination needs.    Engaged with patient by telephone for follow up visit in response to provider referral for pharmacy case management and/or care coordination services.   Consent to Services:  The patient was given information about Chronic Care Management services, agreed to services, and gave verbal consent prior to initiation of services.  Please see initial visit note for detailed documentation.   Patient Care Team: Gutierrez, Javier, MD as PCP - General (Family Medicine) Gollan, Timothy J, MD as PCP - Cardiology (Cardiology) , , RPH as Pharmacist (Pharmacist)  Recent office visits: 05/28/21 - Gutierrez - Trial off gabapentin (see how you do, ok to restart if needed). Try lower dose trazodone (50mg sent to pharmacy). Restart advair inhaler 2 puffs twice daily. This should help breathing. We will refer you to physical therapy. Update me in 2-3 weeks with how you're doing. We may add second mood medicine in addition to sertraline.  08/12/20 - Gutierrez - Continue current medications, RTC 6 months  Recent consult visits: 12/03/20 - Urology -  Urge incontinence is most completely resolved.  We also have her on topical estrogen cream with her history of recurrent UTIs.  Continue Myrbetriq 50 mg daily with coupon. RTC 1 year.  11/05/20 - Urology - OAB - Started 6 months UTI prophylaxis August 2021 nitrofurantoin 50 mg daily. She stopped this last week unclear reason. Macrobid 100 mg twice daily x5 days for suspected UTI, call with UA and culture results. Start topical estrogen cream for history of recurrent UTIs. Trial of Myrbetriq 50 mg daily for OAB. 09/11/20 - Cardiology - She remains on statin and Zetia therapy with an LDL of sixty-three in November.  She is also made significant dietary adjustments. Off antihypertensives, stable. CHpEF, stable, no recent furosemide use. PAD/CAD stable on aspirin, Plavix, statin. Prior stroke, no new symptoms, COPD, oxygen PRN, no wheezing on exam, RTC 6 months.   Hospital visits: None in previous 6 months  Objective:  Lab Results  Component Value Date   CREATININE 0.98 04/02/2021   BUN 23 04/02/2021   GFR 49.72 (L) 08/05/2020   GFRNONAA >60 04/02/2021   GFRAA >60 06/26/2020   NA 138 04/02/2021   K 4.3 04/02/2021   CALCIUM 9.5 04/02/2021   CO2 30 04/02/2021   GLUCOSE 99 04/02/2021    Lab Results  Component Value Date/Time   HGBA1C 5.1 07/31/2018 02:17 AM   HGBA1C  09/30/2008 10:35 PM    5.6 (NOTE)   The ADA recommends the following therapeutic goal for glycemic   control related to Hgb A1C measurement:   Goal of Therapy:   < 7.0% Hgb A1C

## 2021-07-07 DIAGNOSIS — R2689 Other abnormalities of gait and mobility: Secondary | ICD-10-CM | POA: Diagnosis not present

## 2021-07-07 DIAGNOSIS — M6281 Muscle weakness (generalized): Secondary | ICD-10-CM | POA: Diagnosis not present

## 2021-07-12 DIAGNOSIS — M6281 Muscle weakness (generalized): Secondary | ICD-10-CM | POA: Diagnosis not present

## 2021-07-12 DIAGNOSIS — R2689 Other abnormalities of gait and mobility: Secondary | ICD-10-CM | POA: Diagnosis not present

## 2021-07-14 NOTE — Patient Instructions (Signed)
Dear [name] [name],  Below is a summary of the goals we discussed during our follow up appointment on July 05, 2021. Please contact me anytime with questions or concerns.   Visit Information Patient Care Plan: CCM Pharmacy Care Plan     Problem Identified: [facility] "PATIENT-SPECIFIC PROBLEM"      Long-Range Goal: Disease Management   Start Date: 12/15/2020  This Visit's Progress: On track  Priority: High  Note:   Current Barriers:  None identified  Pharmacist Clinical Goal(s):  Patient will contact provider office for questions/concerns as evidenced notation of same in electronic health record through collaboration with PharmD and provider.   Interventions: 1:1 collaboration with [name] Boyden, MD regarding development and update of comprehensive plan of care as evidenced by provider attestation and co-signature Inter-disciplinary care team collaboration (see longitudinal plan of care) Comprehensive medication review performed; medication list updated in electronic medical record  Hyperlipidemia: (LDL goal < 70) -Controlled - LDL  63 -Pt confirms adherence, no side effects reported. She takes both cholesterol meds at night. -Current treatment: Atorvastatin 40 mg - 1 tablet daily Zetia 10 mg - 1 tablet daily at bedtime  -Medications previously tried: none -Recommended to continue current medication  Osteopenia (Goal : Improve bone density) -Controlled -Last DEXA Scan: 06/2017 - T score -2.2 spine, -2.4 hip (improved from 2012) -Current treatment  Fosamax 70 mg - 1 tablet weekly Vitamin D3 2000 IU - 1 tablet daily   -Medications previously tried:  Prolia cost prohibitive -Patient recently started PT for balance and this is going well.  -She is getting more exercise since she moved in with younger daughter. Going out more.  -Recommended to continue current medication  Patient Goals/Self-Care Activities Patient will:  - take medications as prescribed - contact  office with any concerns       Patient verbalizes understanding of instructions provided today and agrees to view in MyChart.  Telephone follow up appointment with pharmacy team member scheduled for: 12 months  Phil Dopp, PharmD Clinical Pharmacist [facility] at [facility] [phone]

## 2021-07-17 DIAGNOSIS — R269 Unspecified abnormalities of gait and mobility: Secondary | ICD-10-CM | POA: Diagnosis not present

## 2021-07-17 DIAGNOSIS — J449 Chronic obstructive pulmonary disease, unspecified: Secondary | ICD-10-CM | POA: Diagnosis not present

## 2021-07-19 DIAGNOSIS — M6281 Muscle weakness (generalized): Secondary | ICD-10-CM | POA: Diagnosis not present

## 2021-07-19 DIAGNOSIS — R2689 Other abnormalities of gait and mobility: Secondary | ICD-10-CM | POA: Diagnosis not present

## 2021-07-26 ENCOUNTER — Telehealth: Payer: Medicare HMO | Admitting: Family Medicine

## 2021-07-26 ENCOUNTER — Encounter: Payer: Self-pay | Admitting: Family Medicine

## 2021-07-26 ENCOUNTER — Other Ambulatory Visit: Payer: Self-pay

## 2021-07-26 VITALS — BP 124/80 | HR 88 | Temp 98.6°F | Ht 59.0 in | Wt 182.0 lb

## 2021-07-26 DIAGNOSIS — E782 Mixed hyperlipidemia: Secondary | ICD-10-CM | POA: Diagnosis not present

## 2021-07-26 DIAGNOSIS — R21 Rash and other nonspecific skin eruption: Secondary | ICD-10-CM

## 2021-07-26 DIAGNOSIS — J111 Influenza due to unidentified influenza virus with other respiratory manifestations: Secondary | ICD-10-CM

## 2021-07-26 MED ORDER — NYSTATIN 100000 UNIT/GM EX CREA: 1.0000 "application " | TOPICAL_CREAM | Freq: Two times a day (BID) | CUTANEOUS | 3 refills | Status: DC

## 2021-07-26 NOTE — Progress Notes (Addendum)
Patient ID: [name] [name], female    DOB: 08/15/1946, 75 y.o.   MRN: [id number]  Virtual visit completed through MyChart, a video enabled telemedicine application. Due to national recommendations of social distancing due to COVID-19, a virtual visit is felt to be most appropriate for this patient at this time. Reviewed limitations, risks, security and privacy concerns of performing a virtual visit and the availability of in person appointments. I also reviewed that there may be a patient responsible charge related to this service. The patient agreed to proceed.   Patient location: home Provider location: [name] [name] at [facility], office Persons participating in this virtual visit: patient, provider, daughter [name] [name]  If any vitals were documented, they were collected by patient at home unless specified below.    BP 124/80   Pulse 88   Temp 98.6 F (37 C)   Ht 4\' 11"  (1.499 m)   Wt 182 lb (82.6 kg)   BMI 36.76 kg/m    CC: cold symptoms Subjective:   HPI: [name] [name] is a 75 y.o. female presenting on 07/26/2021 for Cough (C/o cough and runny nose.   Sxs started 07/21/21.  Also, had diarrhea- resolved and exposed to family members with flu. No fever, body aches, nausea or vomiting.  Had flu shot 06/02/21. ) and Rash (C/o rash on groin area.  Started about 2 wks ago.  Same rash she usually has under breasts. )   Pt sick 1 wk ago with malaise, fatigue, diarrhea, then over weekend rhinorrhea and cough. Fortunately she did get better, she did receive flu vaccine this year.  Has residual cough but overall improved.  Known COPD on PRN oxygen - portable oxygen concentrator was unaffordable (she was classified as a new patient).  Grandchildren are sick with influenza A.   New rash in groin, spreading. Vagisil didn't help. Tender > itchy. Red rash.   She is getting outpatient physical therapy and doing great!      Relevant past medical, surgical, family and social history reviewed  and updated as indicated. Interim medical history since our last visit reviewed. Allergies and medications reviewed and updated. Outpatient Medications Prior to Visit  Medication Sig Dispense Refill   acetaminophen (TYLENOL) 500 MG tablet Take 1 tablet (500 mg total) by mouth every 6 (six) hours as needed for headache.     albuterol (VENTOLIN HFA) 108 (90 Base) MCG/ACT inhaler INHALE 2 PUFFS INTO THE LUNGS EVERY 6 (SIX) HOURS AS NEEDED FOR WHEEZING OR SHORTNESS OF BREATH. 18 g 0   alendronate (FOSAMAX) 70 MG tablet Take with a full glass of water on an empty stomach. 12 tablet 2   aspirin (ASPIRIN 81) 81 MG EC tablet Take 1 tablet (81 mg total) by mouth daily. Swallow whole.     atorvastatin (LIPITOR) 40 MG tablet Take 1 tablet (40 mg total) by mouth daily. 90 tablet 3   Cholecalciferol (VITAMIN D) 50 MCG (2000 UT) tablet Take 2,000 Units by mouth daily.     clopidogrel (PLAVIX) 75 MG tablet TAKE 1 TABLET (75 MG TOTAL) BY MOUTH DAILY. 90 tablet 2   Cranberry-Vitamin C (AZO CRANBERRY URINARY TRACT PO) Take 1 tablet by mouth in the morning and at bedtime.      estradiol (ESTRACE) 0.1 MG/GM vaginal cream Estrogen Cream Instruction Discard applicator Apply pea sized amount to tip of finger to urethra before bed. Wash hands well after application. Use Monday, Wednesday and Friday 42.5 g 12   ezetimibe (ZETIA) 10 MG  tablet TAKE 1 TABLET (10 MG TOTAL) BY MOUTH DAILY. 90 tablet 0   fluticasone-salmeterol (ADVAIR HFA) 115-21 MCG/ACT inhaler Inhale 2 puffs into the lungs 2 (two) times daily. 1 each 12   furosemide (LASIX) 20 MG tablet Take 1 tablet (20 mg total) by mouth daily as needed for fluid. Weight every morning before given 90 tablet 3   gabapentin (NEURONTIN) 100 MG capsule Take 1 capsule (100 mg total) by mouth at bedtime. 90 capsule 2   levothyroxine (SYNTHROID) 50 MCG tablet TAKE 1 TABLET EVERY DAY BEFORE BREAKFAST 90 tablet 0   Melatonin 5 MG CHEW Chew 10 mg by mouth daily.     mirabegron ER  (MYRBETRIQ) 50 MG TB24 tablet Take 1 tablet (50 mg total) by mouth daily. 30 tablet 11   Multiple Vitamins-Minerals (MULTIVITAMIN PO) Take 1 tablet by mouth daily. Gummie     OXYGEN Inhale 2 L/min into the lungs See admin instructions. Inhale 2 L/min of oxygen at bedtime and during the day as needed for shortness of breath     pantoprazole (PROTONIX) 40 MG tablet TAKE 1 TABLET EVERY DAY AS NEEDED 90 tablet 3   Potassium Chloride ER 20 MEQ TBCR Take 1 tablet by mouth 2 (two) times daily as needed (with lasix use). 180 tablet 2   sertraline (ZOLOFT) 100 MG tablet Take 1 tablet (100 mg total) by mouth daily. 90 tablet 2   traMADol (ULTRAM) 50 MG tablet Take 1 tablet (50 mg total) by mouth 2 (two) times daily as needed for moderate pain. 20 tablet 0   traZODone (DESYREL) 50 MG tablet Take 1 tablet (50 mg total) by mouth at bedtime. 90 tablet 1   vitamin C (ASCORBIC ACID) 500 MG tablet Take 500 mg by mouth daily.     No facility-administered medications prior to visit.     Per HPI unless specifically indicated in ROS section below Review of Systems Objective:  BP 124/80   Pulse 88   Temp 98.6 F (37 C)   Ht 4\' 11"  (1.499 m)   Wt 182 lb (82.6 kg)   BMI 36.76 kg/m   Wt Readings from Last 3 Encounters:  07/26/21 182 lb (82.6 kg)  06/02/21 183 lb 5 oz (83.2 kg)  04/02/21 175 lb (79.4 kg)       Physical exam: Gen: alert, NAD, not ill appearing Pulm: speaks in complete sentences without increased work of breathing Psych: normal mood, normal thought content      Assessment & Plan:  They called Wednesday afternoon - told we wouldn't prescribe tamiflu over the phone, wouldn't do curbside flu swab, couldn't see her until Monday virtual visit. All were incorrect. I apologized for the wrong information given and advised we would look into this breakdown in communication. No notes in EMR regarding phone call.  I spoke to our Monday about this.   Problem List Items Addressed This  Visit     Influenza - Primary    Anticipate she developed influenza given symptoms and sick contacts at home. She would have been good candidate for tamiflu. But I never received message. Fortunately she seems to have recovered well. Update if recurrent symptoms.       Relevant Medications   nystatin cream (MYCOSTATIN)   Groin rash    Describes candidal rash.  Will treat with nystatin cream.  Update if recurrent or ongoing.         Meds ordered this encounter  Medications   nystatin cream (MYCOSTATIN)  Sig: Apply 1 application topically 2 (two) times daily.    Dispense:  30 g    Refill:  3    No orders of the defined types were placed in this encounter.   I discussed the assessment and treatment plan with the patient. The patient was provided an opportunity to ask questions and all were answered. The patient agreed with the plan and demonstrated an understanding of the instructions. The patient was advised to call back or seek an in-person evaluation if the symptoms worsen or if the condition fails to improve as anticipated.  Follow up plan: No follow-ups on file.  [name], MD

## 2021-07-27 ENCOUNTER — Telehealth: Payer: Medicare HMO | Admitting: Family Medicine

## 2021-07-27 ENCOUNTER — Other Ambulatory Visit (HOSPITAL_COMMUNITY): Payer: Self-pay | Admitting: Interventional Radiology

## 2021-07-27 ENCOUNTER — Encounter: Payer: Self-pay | Admitting: Family Medicine

## 2021-07-27 DIAGNOSIS — R21 Rash and other nonspecific skin eruption: Secondary | ICD-10-CM | POA: Insufficient documentation

## 2021-07-27 DIAGNOSIS — I729 Aneurysm of unspecified site: Secondary | ICD-10-CM

## 2021-07-27 DIAGNOSIS — J111 Influenza due to unidentified influenza virus with other respiratory manifestations: Secondary | ICD-10-CM | POA: Insufficient documentation

## 2021-07-27 DIAGNOSIS — I771 Stricture of artery: Secondary | ICD-10-CM

## 2021-07-27 NOTE — Assessment & Plan Note (Addendum)
Describes candidal rash.  Will treat with nystatin cream.  Update if recurrent or ongoing.

## 2021-07-27 NOTE — Assessment & Plan Note (Signed)
Anticipate she developed influenza given symptoms and sick contacts at home. She would have been good candidate for tamiflu. But I never received message. Fortunately she seems to have recovered well. Update if recurrent symptoms.

## 2021-07-28 ENCOUNTER — Ambulatory Visit: Payer: Medicare HMO | Admitting: Physician Assistant

## 2021-07-28 ENCOUNTER — Other Ambulatory Visit: Payer: Self-pay

## 2021-07-28 VITALS — BP 118/78 | HR 75 | Ht 62.0 in | Wt 175.0 lb

## 2021-07-28 DIAGNOSIS — N390 Urinary tract infection, site not specified: Secondary | ICD-10-CM | POA: Diagnosis not present

## 2021-07-28 LAB — URINALYSIS, COMPLETE
Bilirubin, UA: NEGATIVE
Glucose, UA: NEGATIVE
Ketones, UA: NEGATIVE
Nitrite, UA: POSITIVE — AB
Specific Gravity, UA: 1.025 (ref 1.005–1.030)
Urobilinogen, Ur: 0.2 mg/dL (ref 0.2–1.0)
pH, UA: 6 (ref 5.0–7.5)

## 2021-07-28 LAB — MICROSCOPIC EXAMINATION: WBC, UA: >30 /hpf — ABNORMAL HIGH (ref 0–5)

## 2021-07-28 MED ORDER — SULFAMETHOXAZOLE-TRIMETHOPRIM 800-160 MG PO TABS: 1.0000 | ORAL_TABLET | Freq: Two times a day (BID) | ORAL | 0 refills | Status: AC

## 2021-07-28 NOTE — Progress Notes (Signed)
07/28/2021 10:48 AM   [name] [name] [name] [name] [birth year] [id number]  CC: Chief Complaint  Patient presents with   Recurrent UTI   HPI: [name] [name] is a 75 y.o. female with PMH recurrent UTI on vaginal estrogen cream and OAB wet on Myrbetriq 50 mg daily who presents today for valuation of possible acute UTI.  She is accompanied today by her daughter, who contributes to HPI.  Today she reports an approximate 1 week history of dysuria and malodorous urine associated with lower abdominal pain.  She is taken Azo for symptom control, most recently 2 to 3 days ago.  She is still using her vaginal estrogen cream, but states she has had some issues with remembering to take this so she does not do it consistently.  She has some chills over the weekend, but denies fever, nausea, vomiting, and gross hematuria.  She denies any other UTIs since she was last seen in clinic in our March of this year.  In-office UA today positive for 1+ blood, 1+ protein, nitrites, and 2+ leukocyte esterase; urine microscopy with >30 WBCs/HPF, 3-10 RBCs/HPF, and many bacteria.  PMH: Past Medical History:  Diagnosis Date   (HFpEF) heart failure with preserved ejection fraction ([place])    a. 11/2019 Echo: EF 60-65%, no rwma. Mild LVH. Mild AI, mild to mod AS.   Aneurysm [facility])    She had 2.6 cm dilation of the infrarenal abdominal aorta on 08/01/15 CTA with 5 year [place] recommended   Anxiety    Anxiety and depression    Aortic stenosis    mild-moderate AS 12/23/19 echo   Carotid stenosis    a. 06/2020 Carotid/Cerebral angio: LICA 70, L Vert 50, RCCA 50-60d, RICA 60-70p, 3.3x2.3 RICA aneurysm, RMCA M1 50-70.   CHF (congestive heart failure) (HCC)    Compression fracture of body of thoracic vertebra (HCC)    T 10   Concussion 08/03/2015   COPD (chronic obstructive pulmonary disease) ([place]) 12/2012   spirometry: Pre: FVC 84%, FEV1 69%, ratio 0.64 consistent with moderate obstruction.   Depression    Fall 08/03/2015   d/c  home health 08/2015   Fracture of cervical vertebra, C5 ([place]) 08/06/2015   History of chicken pox    History of stress test    a. 01/2019 MV: No isch/infarct. EF 71%.   Hyperlipidemia    Hypertension    Hypothyroidism    Lower back pain    h/o HNP s/p surgery   Neuropathy    B/L feet   On supplemental oxygen by nasal cannula    at HS and PRN during the day   Osteoarthritis    h/o ruptured disc s/p ESI   Osteoporosis 11/2010   DEXA -2.7 spine, thoracic compression fracture   Peripheral vascular disease ([place])    Smoker    quit 10/2012   Stroke ([place]) 2010   x3 with residual R hemiparesis, s/p R MCA balloon angioplasty (2010)   Wears dentures    upper    Surgical History: Past Surgical History:  Procedure Laterality Date   APPENDECTOMY  1960   CATARACT EXTRACTION     bilateral   [place]   COLONOSCOPY  2004   diverticulosis, no polyps [facility])   COLONOSCOPY  05/2016   decreased sphincter tone, diverticulosis, no f/u recommended ([place])   DEXA  11/2010   T -2.7 spine, -1.9 hip   HIP SURGERY Left 2006   fractured - screws placed  IR ANGIO INTRA EXTRACRAN SEL COM CAROTID INNOMINATE BILAT MOD SED  08/01/2018   IR ANGIO INTRA EXTRACRAN SEL COM CAROTID INNOMINATE BILAT MOD SED  12/10/2018   IR ANGIO INTRA EXTRACRAN SEL COM CAROTID INNOMINATE BILAT MOD SED  06/26/2020   IR ANGIO INTRA EXTRACRAN SEL COM CAROTID INNOMINATE BILAT MOD SED  04/02/2021   IR ANGIO VERTEBRAL SEL SUBCLAVIAN INNOMINATE BILAT MOD SED  04/02/2021   IR ANGIO VERTEBRAL SEL SUBCLAVIAN INNOMINATE UNI L MOD SED  06/26/2020   IR ANGIO VERTEBRAL SEL SUBCLAVIAN INNOMINATE UNI R MOD SED  08/01/2018   IR ANGIO VERTEBRAL SEL SUBCLAVIAN INNOMINATE UNI R MOD SED  12/10/2018   IR ANGIO VERTEBRAL SEL VERTEBRAL UNI L MOD SED  12/10/2018   IR RADIOLOGIST EVAL & MGMT  01/09/2018   KYPHOPLASTY  10/02/2017   Procedure: LUMBAR FOUR KYPHOPLASTY;  Surgeon: Ashok Pall, MD;  Location: [place];  Service:  Neurosurgery;;   KYPHOPLASTY N/A 01/29/2020   Procedure: Thoracic Ten Kyphoplasty;  Surgeon: Ashok Pall, MD;  Location: [place];  Service: Neurosurgery;  Laterality: N/A;   RADIOLOGY WITH ANESTHESIA N/A 11/11/2015   Procedure: RADIOLOGY WITH ANESTHESIA;  Surgeon: Luanne Bras, MD;  Location: [place];  Service: Radiology;  Laterality: N/A;   RADIOLOGY WITH ANESTHESIA N/A 12/10/2018   Procedure: STENTING;  Surgeon: Luanne Bras, MD;  Location: [place];  Service: Radiology;  Laterality: N/A;    Home Medications:  Allergies as of 07/28/2021       Reactions   Doxycycline Nausea Only, Other (See Comments)   GI upset   Oxycodone Other (See Comments)   Overly sedating, hypotension, decreased oxgyen        Medication List        Accurate as of July 28, 2021 10:48 AM. If you have any questions, ask your nurse or doctor.          acetaminophen 500 MG tablet Commonly known as: TYLENOL Take 1 tablet (500 mg total) by mouth every 6 (six) hours as needed for headache.   albuterol 108 (90 Base) MCG/ACT inhaler Commonly known as: VENTOLIN HFA INHALE 2 PUFFS INTO THE LUNGS EVERY 6 (SIX) HOURS AS NEEDED FOR WHEEZING OR SHORTNESS OF BREATH.   alendronate 70 MG tablet Commonly known as: FOSAMAX Take with a full glass of water on an empty stomach.   aspirin 81 MG EC tablet Commonly known as: Aspirin 81 Take 1 tablet (81 mg total) by mouth daily. Swallow whole.   atorvastatin 40 MG tablet Commonly known as: LIPITOR Take 1 tablet (40 mg total) by mouth daily.   AZO CRANBERRY URINARY TRACT PO Take 1 tablet by mouth in the morning and at bedtime.   clopidogrel 75 MG tablet Commonly known as: PLAVIX TAKE 1 TABLET (75 MG TOTAL) BY MOUTH DAILY.   estradiol 0.1 MG/GM vaginal cream Commonly known as: ESTRACE Estrogen Cream Instruction Discard applicator Apply pea sized amount to tip of finger to urethra before bed. Wash hands well after application. Use Monday, Wednesday and  Friday   ezetimibe 10 MG tablet Commonly known as: ZETIA TAKE 1 TABLET (10 MG TOTAL) BY MOUTH DAILY.   fluticasone-salmeterol 115-21 MCG/ACT inhaler Commonly known as: ADVAIR HFA Inhale 2 puffs into the lungs 2 (two) times daily.   furosemide 20 MG tablet Commonly known as: LASIX Take 1 tablet (20 mg total) by mouth daily as needed for fluid. Weight every morning before given   gabapentin 100 MG capsule Commonly known as: NEURONTIN Take 1 capsule (100  mg total) by mouth at bedtime.   levothyroxine 50 MCG tablet Commonly known as: SYNTHROID TAKE 1 TABLET EVERY DAY BEFORE BREAKFAST   Melatonin 5 MG Chew Chew 10 mg by mouth daily.   mirabegron ER 50 MG Tb24 tablet Commonly known as: MYRBETRIQ Take 1 tablet (50 mg total) by mouth daily.   MULTIVITAMIN PO Take 1 tablet by mouth daily. Gummie   nystatin cream Commonly known as: MYCOSTATIN Apply 1 application topically 2 (two) times daily.   OXYGEN Inhale 2 L/min into the lungs See admin instructions. Inhale 2 L/min of oxygen at bedtime and during the day as needed for shortness of breath   pantoprazole 40 MG tablet Commonly known as: PROTONIX TAKE 1 TABLET EVERY DAY AS NEEDED   Potassium Chloride ER 20 MEQ Tbcr Take 1 tablet by mouth 2 (two) times daily as needed (with lasix use).   sertraline 100 MG tablet Commonly known as: ZOLOFT Take 1 tablet (100 mg total) by mouth daily.   traMADol 50 MG tablet Commonly known as: ULTRAM Take 1 tablet (50 mg total) by mouth 2 (two) times daily as needed for moderate pain.   traZODone 50 MG tablet Commonly known as: DESYREL Take 1 tablet (50 mg total) by mouth at bedtime.   vitamin C 500 MG tablet Commonly known as: ASCORBIC ACID Take 500 mg by mouth daily.   Vitamin D 50 MCG (2000 UT) tablet Take 2,000 Units by mouth daily.        Allergies:  Allergies  Allergen Reactions   Doxycycline Nausea Only and Other (See Comments)    GI upset    Oxycodone Other (See  Comments)    Overly sedating, hypotension, decreased oxgyen    Family History: Family History  Problem Relation Age of Onset   CAD Mother        MI   Cancer Mother        cervical   CAD Maternal Aunt        MI   CAD Sister        MI   Sudden death Father 8       died in his sleep, chain smoker   Cirrhosis Father    Hypertension Daughter    Diabetes Maternal Aunt    Cancer Maternal Grandmother        cervical    Social History:   reports that she quit smoking about 8 years ago. Her smoking use included cigarettes. She has a 92.00 pack-year smoking history. She has never used smokeless tobacco. She reports that she does not drink alcohol and does not use drugs.  Physical Exam: BP 118/78   Pulse 75   Ht 5\' 2"  (1.575 m)   Wt 175 lb (79.4 kg)   BMI 32.01 kg/m   Constitutional:  Alert and oriented, no acute distress, nontoxic appearing HEENT: [place], AT Cardiovascular: No clubbing, cyanosis, or edema Respiratory: Normal respiratory effort, no increased work of breathing Skin: No rashes, bruises or suspicious lesions Neurologic: Grossly intact, no focal deficits, moving all 4 extremities Psychiatric: Normal mood and affect  Laboratory Data: Results for orders placed or performed in visit on 07/28/21  Microscopic Examination   Urine  Result Value Ref Range   WBC, UA WILL FOLLOW    RBC WILL FOLLOW    Epithelial Cells (non renal) WILL FOLLOW    Renal Epithel, UA WILL FOLLOW    Casts WILL FOLLOW    Cast Type WILL FOLLOW    Crystals WILL  FOLLOW    Crystal Type WILL FOLLOW    Mucus, UA WILL FOLLOW    Bacteria, UA WILL FOLLOW    Yeast, UA WILL FOLLOW    Trichomonas, UA WILL FOLLOW    Urinalysis Comments WILL FOLLOW   Urinalysis, Complete  Result Value Ref Range   Specific Gravity, UA 1.025 1.005 - 1.030   pH, UA 6.0 5.0 - 7.5   Color, UA Yellow Yellow   Appearance Ur Cloudy (A) Clear   Leukocytes,UA 2+ (A) Negative   Protein,UA 1+ (A) Negative/Trace   Glucose, UA  Negative Negative   Ketones, UA Negative Negative   RBC, UA 1+ (A) Negative   Bilirubin, UA Negative Negative   Urobilinogen, Ur 0.2 0.2 - 1.0 mg/dL   Nitrite, UA Positive (A) Negative   Microscopic Examination See below:    Assessment & Plan:   1. Recurrent UTI UA grossly infected today, though nitrites may be false positive from recent Azo use.  Will start empiric Bactrim and send for culture for further evaluation.  We discussed that we will need to repeat a UA in 1 week to prove resolution of her microscopic hematuria.  She expressed understanding.  Encouraged her to continue estrogen cream 3 times weekly. - Urinalysis, Complete - CULTURE, URINE COMPREHENSIVE - sulfamethoxazole-trimethoprim (BACTRIM DS) 800-160 MG tablet; Take 1 tablet by mouth 2 (two) times daily for 5 days.  Dispense: 10 tablet; Refill: 0   Return in about 1 week (around 08/04/2021) for Lab visit for UA.  Debroah Loop, PA-C  [facility] [address] [place], [place] [address] [phone]

## 2021-08-01 LAB — CULTURE, URINE COMPREHENSIVE

## 2021-08-05 ENCOUNTER — Other Ambulatory Visit: Payer: Medicare HMO

## 2021-08-08 ENCOUNTER — Other Ambulatory Visit: Payer: Self-pay | Admitting: Family Medicine

## 2021-08-08 DIAGNOSIS — E782 Mixed hyperlipidemia: Secondary | ICD-10-CM

## 2021-08-08 DIAGNOSIS — D696 Thrombocytopenia, unspecified: Secondary | ICD-10-CM

## 2021-08-08 DIAGNOSIS — E559 Vitamin D deficiency, unspecified: Secondary | ICD-10-CM

## 2021-08-08 DIAGNOSIS — N1831 Chronic kidney disease, stage 3a: Secondary | ICD-10-CM

## 2021-08-08 DIAGNOSIS — E039 Hypothyroidism, unspecified: Secondary | ICD-10-CM

## 2021-08-09 ENCOUNTER — Other Ambulatory Visit: Payer: Self-pay

## 2021-08-09 ENCOUNTER — Other Ambulatory Visit: Payer: Medicare HMO

## 2021-08-09 ENCOUNTER — Ambulatory Visit (HOSPITAL_COMMUNITY)
Admission: RE | Admit: 2021-08-09 | Discharge: 2021-08-09 | Disposition: A | Discharge status: Home / Self Care | Payer: Medicare HMO | Source: Ambulatory Visit | Attending: Interventional Radiology | Admitting: Interventional Radiology

## 2021-08-09 DIAGNOSIS — I729 Aneurysm of unspecified site: Secondary | ICD-10-CM

## 2021-08-09 DIAGNOSIS — I771 Stricture of artery: Secondary | ICD-10-CM | POA: Insufficient documentation

## 2021-08-09 DIAGNOSIS — I671 Cerebral aneurysm, nonruptured: Secondary | ICD-10-CM | POA: Insufficient documentation

## 2021-08-09 DIAGNOSIS — I6529 Occlusion and stenosis of unspecified carotid artery: Secondary | ICD-10-CM | POA: Diagnosis not present

## 2021-08-09 NOTE — Progress Notes (Signed)
Subjective:   [name] [name] is a 75 y.o. female who presents for Medicare Annual (Subsequent) preventive examination.  I connected with [name] [name] today by telephone and verified that I am speaking with the correct person using two identifiers. Location patient: home Location provider: work Persons participating in the virtual visit: patient, Engineer, civil (consulting).    I discussed the limitations, risks, security and privacy concerns of performing an evaluation and management service by telephone and the availability of in person appointments. I also discussed with the patient that there may be a patient responsible charge related to this service. The patient expressed understanding and verbally consented to this telephonic visit.    Interactive audio and video telecommunications were attempted between this provider and patient, however failed, due to patient having technical difficulties OR patient did not have access to video capability.  We continued and completed visit with audio only.  Some vital signs may be absent or patient reported.   Time Spent with patient on telephone encounter: 25 minutes   Review of Systems     Cardiac Risk Factors include: advanced age (>32men, >55 women);hypertension;dyslipidemia     Objective:    Today's Vitals   08/13/21 0909  Weight: 175 lb (79.4 kg)  Height: 5\' 2"  (1.575 m)   Body mass index is 32.01 kg/m.  Advanced Directives 08/13/2021 04/02/2021 06/26/2020 02/14/2020 02/14/2020 01/29/2020 01/27/2020  Does Patient Have a Medical Advance Directive? Yes Yes Yes Yes Yes Yes Yes  Type of 03/28/2020 of [place];Living will Healthcare Power of [place];Living will Living will Living will Living will - Living will  Does patient want to make changes to medical advance directive? Yes (MAU/Ambulatory/Procedural Areas - Information given) No - Patient declined No - Patient declined No - Patient declined No - Patient declined No - Patient  declined No - Patient declined  Copy of Healthcare Power of Attorney in Chart? Yes - validated most recent copy scanned in chart (See row information) No - copy requested - - - - -  Would patient like information on creating a medical advance directive? - - - - - - -    Current Medications (verified) Outpatient Encounter Medications as of 08/13/2021  Medication Sig   acetaminophen (TYLENOL) 500 MG tablet Take 1 tablet (500 mg total) by mouth every 6 (six) hours as needed for headache.   albuterol (VENTOLIN HFA) 108 (90 Base) MCG/ACT inhaler INHALE 2 PUFFS INTO THE LUNGS EVERY 6 (SIX) HOURS AS NEEDED FOR WHEEZING OR SHORTNESS OF BREATH.   alendronate (FOSAMAX) 70 MG tablet Take with a full glass of water on an empty stomach.   aspirin (ASPIRIN 81) 81 MG EC tablet Take 1 tablet (81 mg total) by mouth daily. Swallow whole.   atorvastatin (LIPITOR) 40 MG tablet Take 1 tablet (40 mg total) by mouth daily.   Cholecalciferol (VITAMIN D) 50 MCG (2000 UT) tablet Take 2,000 Units by mouth daily.   clopidogrel (PLAVIX) 75 MG tablet TAKE 1 TABLET (75 MG TOTAL) BY MOUTH DAILY.   Cranberry-Vitamin C (AZO CRANBERRY URINARY TRACT PO) Take 1 tablet by mouth in the morning and at bedtime.    estradiol (ESTRACE) 0.1 MG/GM vaginal cream Estrogen Cream Instruction Discard applicator Apply pea sized amount to tip of finger to urethra before bed. Wash hands well after application. Use Monday, Wednesday and Friday   ezetimibe (ZETIA) 10 MG tablet TAKE 1 TABLET (10 MG TOTAL) BY MOUTH DAILY.   fluticasone-salmeterol (ADVAIR HFA) 115-21 MCG/ACT inhaler Inhale 2  puffs into the lungs 2 (two) times daily.   furosemide (LASIX) 20 MG tablet Take 1 tablet (20 mg total) by mouth daily as needed for fluid. Weight every morning before given   gabapentin (NEURONTIN) 100 MG capsule Take 1 capsule (100 mg total) by mouth at bedtime.   levothyroxine (SYNTHROID) 50 MCG tablet TAKE 1 TABLET EVERY DAY BEFORE BREAKFAST   Melatonin 5 MG  CHEW Chew 10 mg by mouth daily.   mirabegron ER (MYRBETRIQ) 50 MG TB24 tablet Take 1 tablet (50 mg total) by mouth daily.   Multiple Vitamins-Minerals (MULTIVITAMIN PO) Take 1 tablet by mouth daily. Gummie   nystatin cream (MYCOSTATIN) Apply 1 application topically 2 (two) times daily.   OXYGEN Inhale 2 L/min into the lungs See admin instructions. Inhale 2 L/min of oxygen at bedtime and during the day as needed for shortness of breath   pantoprazole (PROTONIX) 40 MG tablet TAKE 1 TABLET EVERY DAY AS NEEDED   Potassium Chloride ER 20 MEQ TBCR Take 1 tablet by mouth 2 (two) times daily as needed (with lasix use).   sertraline (ZOLOFT) 100 MG tablet Take 1 tablet (100 mg total) by mouth daily.   traMADol (ULTRAM) 50 MG tablet Take 1 tablet (50 mg total) by mouth 2 (two) times daily as needed for moderate pain.   traZODone (DESYREL) 50 MG tablet Take 1 tablet (50 mg total) by mouth at bedtime.   vitamin C (ASCORBIC ACID) 500 MG tablet Take 500 mg by mouth daily.   No facility-administered encounter medications on file as of 08/13/2021.    Allergies (verified) Doxycycline and Oxycodone   History: Past Medical History:  Diagnosis Date   (HFpEF) heart failure with preserved ejection fraction ([place])    a. 11/2019 Echo: EF 60-65%, no rwma. Mild LVH. Mild AI, mild to mod AS.   Aneurysm [facility])    She had 2.6 cm dilation of the infrarenal abdominal aorta on 08/01/15 CTA with 5 year [place] recommended   Anxiety    Anxiety and depression    Aortic stenosis    mild-moderate AS 12/23/19 echo   Carotid stenosis    a. 06/2020 Carotid/Cerebral angio: LICA 70, L Vert 50, RCCA 50-60d, RICA 60-70p, 3.3x2.3 RICA aneurysm, RMCA M1 50-70.   CHF (congestive heart failure) (HCC)    Compression fracture of body of thoracic vertebra (HCC)    T 10   Concussion 08/03/2015   COPD (chronic obstructive pulmonary disease) ([place]) 12/2012   spirometry: Pre: FVC 84%, FEV1 69%, ratio 0.64 consistent with moderate obstruction.    Depression    Fall 08/03/2015   d/c home health 08/2015   Fracture of cervical vertebra, C5 ([place]) 08/06/2015   History of chicken pox    History of stress test    a. 01/2019 MV: No isch/infarct. EF 71%.   Hyperlipidemia    Hypertension    Hypothyroidism    Lower back pain    h/o HNP s/p surgery   Neuropathy    B/L feet   On supplemental oxygen by nasal cannula    at HS and PRN during the day   Osteoarthritis    h/o ruptured disc s/p ESI   Osteoporosis 11/2010   DEXA -2.7 spine, thoracic compression fracture   Peripheral vascular disease ([place])    Smoker    quit 10/2012   Stroke ([place]) 2010   x3 with residual R hemiparesis, s/p R MCA balloon angioplasty (2010)   Wears dentures    upper   Past  Surgical History:  Procedure Laterality Date   APPENDECTOMY  1960   CATARACT EXTRACTION     bilateral   CESAREAN SECTION     CHOLECYSTECTOMY  1970   COLONOSCOPY  2004   diverticulosis, no polyps Sharlett Iles)   COLONOSCOPY  05/2016   decreased sphincter tone, diverticulosis, no f/u recommended ([place])   DEXA  11/2010   T -2.7 spine, -1.9 hip   HIP SURGERY Left 2006   fractured - screws placed   IR ANGIO INTRA EXTRACRAN SEL COM CAROTID INNOMINATE BILAT MOD SED  08/01/2018   IR ANGIO INTRA EXTRACRAN SEL COM CAROTID INNOMINATE BILAT MOD SED  12/10/2018   IR ANGIO INTRA EXTRACRAN SEL COM CAROTID INNOMINATE BILAT MOD SED  06/26/2020   IR ANGIO INTRA EXTRACRAN SEL COM CAROTID INNOMINATE BILAT MOD SED  04/02/2021   IR ANGIO VERTEBRAL SEL SUBCLAVIAN INNOMINATE BILAT MOD SED  04/02/2021   IR ANGIO VERTEBRAL SEL SUBCLAVIAN INNOMINATE UNI L MOD SED  06/26/2020   IR ANGIO VERTEBRAL SEL SUBCLAVIAN INNOMINATE UNI R MOD SED  08/01/2018   IR ANGIO VERTEBRAL SEL SUBCLAVIAN INNOMINATE UNI R MOD SED  12/10/2018   IR ANGIO VERTEBRAL SEL VERTEBRAL UNI L MOD SED  12/10/2018   IR RADIOLOGIST EVAL & MGMT  01/09/2018   KYPHOPLASTY  10/02/2017   Procedure: LUMBAR FOUR KYPHOPLASTY;  Surgeon: Ashok Pall, MD;  Location: [place];  Service: Neurosurgery;;   KYPHOPLASTY N/A 01/29/2020   Procedure: Thoracic Ten Kyphoplasty;  Surgeon: Ashok Pall, MD;  Location: [place];  Service: Neurosurgery;  Laterality: N/A;   RADIOLOGY WITH ANESTHESIA N/A 11/11/2015   Procedure: RADIOLOGY WITH ANESTHESIA;  Surgeon: Luanne Bras, MD;  Location: [place];  Service: Radiology;  Laterality: N/A;   RADIOLOGY WITH ANESTHESIA N/A 12/10/2018   Procedure: STENTING;  Surgeon: Luanne Bras, MD;  Location: [place];  Service: Radiology;  Laterality: N/A;   Family History  Problem Relation Age of Onset   CAD Mother        MI   Cancer Mother        cervical   CAD Maternal Aunt        MI   CAD Sister        MI   Sudden death Father 25       died in his sleep, chain smoker   Cirrhosis Father    Hypertension Daughter    Diabetes Maternal Aunt    Cancer Maternal Grandmother        cervical   Social History   Socioeconomic History   Marital status: Divorced    Spouse name: Not on file   Number of children: Not on file   Years of education: Not on file   Highest education level: Not on file  Occupational History   Not on file  Tobacco Use   Smoking status: Former    Packs/day: 2.00    Years: 46.00    Pack years: 92.00    Types: Cigarettes    Quit date: 11/08/2012    Years since quitting: 8.7   Smokeless tobacco: Never  Vaping Use   Vaping Use: Never used  Substance and Sexual Activity   Alcohol use: No    Alcohol/week: 0.0 standard drinks   Drug use: No   Sexual activity: Not Currently  Other Topics Concern   Not on file  Social History Narrative   Lives with granddaughter and great grandchildren   Occupation: retired, was CNA   Edu: 8th grade   Activity:  no regular activity:   Diet: some water, fruits/vegetables daily   Social Determinants of Health   Financial Resource Strain: Low Risk    Difficulty of Paying Living Expenses: Not hard at all  Food Insecurity: No Food Insecurity   Worried About Ship broker in the Last Year: Never true   Ran Out of Food in the Last Year: Never true  Transportation Needs: No Transportation Needs   Lack of Transportation (Medical): No   Lack of Transportation (Non-Medical): No  Physical Activity: Sufficiently Active   Days of Exercise per Week: 7 days   Minutes of Exercise per Session: 30 min  Stress: No Stress Concern Present   Feeling of Stress : Not at all  Social Connections: Socially Isolated   Frequency of Communication with Friends and Family: More than three times a week   Frequency of Social Gatherings with Friends and Family: Twice a week   Attends Religious Services: Never   Marine scientist or Organizations: No   Attends Music therapist: Never   Marital Status: Divorced    Tobacco Counseling Counseling given: Not Answered   Clinical Intake:  Pre-visit preparation completed: Yes  Pain : No/denies pain     BMI - recorded: 32.01 Nutritional Status: BMI > 30  Obese Nutritional Risks: None Diabetes: No  How often do you need to have someone help you when you read instructions, pamphlets, or other written materials from your doctor or pharmacy?: 1 - Never  Diabetic?No  Interpreter Needed?: No  Information entered by :: Orrin Brigham LPN   Activities of Daily Living In your present state of health, do you have any difficulty performing the following activities: 08/13/2021 04/02/2021  Hearing? N N  Vision? N N  Difficulty concentrating or making decisions? N N  Walking or climbing stairs? Y -  Comment uses walker -  Dressing or bathing? Tempie Donning  Comment daughter assist -  Doing errands, shopping? Y -  Comment daughter assists -  Conservation officer, nature and eating ? Y -  Comment daughter prepares -  Using the Toilet? N -  In the past six months, have you accidently leaked urine? Y -  Do you have problems with loss of bowel control? N -  Managing your Medications? Y -  Comment daughter -  Managing your  Finances? N -  Housekeeping or managing your Housekeeping? Y -  Comment daughter assist -  Some recent data might be hidden    Patient Care Team: Ria Bush, MD as PCP - General (Family Medicine) Rockey Situ Kathlene November, MD as PCP - Cardiology (Cardiology) Debbora Dus, [facility] as Pharmacist (Pharmacist)  Indicate any recent Medical Services you may have received from other than [facility] providers in the past year (date may be approximate).     Assessment:   This is a routine wellness examination for [name] [name].  Hearing/Vision screen Hearing Screening - Comments:: No issues Vision Screening - Comments:: Last exam 2022, Dr Pandora Leiter  Dietary issues and exercise activities discussed: Current Exercise Habits: Home exercise routine, Type of exercise: stretching;strength training/weights, Time (Minutes): 30, Frequency (Times/Week): 7, Weekly Exercise (Minutes/Week): 210, Intensity: Moderate   Goals Addressed             This Visit's Progress    Patient Stated       Would like continue to drink more water and eating healthy.       Depression Screen PHQ 2/9 Scores 06/02/2021 08/12/2020 12/31/2019 07/08/2019 06/27/2018 12/13/2017 12/05/2017  PHQ - 2 Score 5 0 0 0 2 1 3   PHQ- 9 Score 19 6 - 4 4 9 15     Fall Risk Fall Risk  08/13/2021 08/12/2020 07/08/2019 06/27/2018 12/05/2017  Falls in the past year? 0 1 0 Yes Yes  Number falls in past yr: 0 1 0 2 or more 2 or more  Injury with Fall? 0 1 0 Yes Yes  Comment - Back injury - - -  Risk Factor Category  - - - - High Fall Risk  Risk for fall due to : No Fall Risks - - - History of fall(s);Impaired mobility  Risk for fall due to: Comment - - - - She broke her back when she fell out of bed  Follow up Falls prevention discussed - - - Falls evaluation completed;Education provided;Falls prevention discussed    FALL RISK PREVENTION PERTAINING TO THE HOME:  Any stairs in or around the home? Yes  If so, are there any without handrails? No  Home  free of loose throw rugs in walkways, pet beds, electrical cords, etc? Yes  Adequate lighting in your home to reduce risk of falls? Yes   ASSISTIVE DEVICES UTILIZED TO PREVENT FALLS:  Life alert? No  Use of a cane, walker or w/c? Yes , walker Grab bars in the bathroom? Yes  Shower chair or bench in shower? Yes  Elevated toilet seat or a handicapped toilet? Yes   TIMED UP AND GO:  Was the test performed? No , visit completed over the phone.    Cognitive Function: MMSE - Mini Mental State Exam 06/27/2018 06/26/2017  Orientation to time 5 5  Orientation to Place 5 5  Registration 3 3  Attention/ Calculation 0 0  Recall 3 2  Recall-comments - pt was unable to recall 1 of 3 words  Language- name 2 objects 0 0  Language- repeat 1 1  Language- follow 3 step command 0 0  Language- follow 3 step command-comments unable to follow 3 steps of 3 step command pt was unable to follow 3 steps of 3 steps command  Language- read & follow direction 0 0  Write a sentence 0 0  Copy design 0 0  Total score 17 16        Immunizations Immunization History  Administered Date(s) Administered   Fluad Quad(high Dose 65+) 07/08/2019, 08/12/2020, 06/02/2021   Influenza,inj,Quad PF,6+ Mos 07/04/2013, 08/03/2015, 06/26/2017, 06/27/2018   PFIZER(Purple Top)SARS-COV-2 Vaccination 11/22/2019, 12/17/2019   Pneumococcal Conjugate-13 01/02/2014   Pneumococcal Polysaccharide-23 11/26/2012   Td 09/26/2004   Tdap 08/06/2015    TDAP status: Up to date  Flu Vaccine status: Up to date  Pneumococcal vaccine status: Up to date  Covid-19 vaccine status: Declined, Education has been provided regarding the importance of this vaccine but patient still declined. Advised may receive this vaccine at local pharmacy or Health Dept.or vaccine clinic. Aware to provide a copy of the vaccination record if obtained from local pharmacy or Health Dept. Verbalized acceptance and understanding.  Qualifies for Shingles  Vaccine? Yes   Zostavax completed No   Shingrix Completed?: No.    Education has been provided regarding the importance of this vaccine. Patient has been advised to call insurance company to determine out of pocket expense if they have not yet received this vaccine. Advised may also receive vaccine at local pharmacy or Health Dept. Verbalized acceptance and understanding.  Screening Tests Health Maintenance  Topic Date Due   Zoster Vaccines- Shingrix (1 of 2) Never  done   COVID-19 Vaccine (3 - Booster for Pfizer series) 02/11/2020   MAMMOGRAM  07/29/2021   TETANUS/TDAP  08/05/2025   Pneumonia Vaccine 30+ Years old  Completed   INFLUENZA VACCINE  Completed   DEXA SCAN  Completed   Hepatitis C Screening  Completed   HPV VACCINES  Aged Out    Health Maintenance  Health Maintenance Due  Topic Date Due   Zoster Vaccines- Shingrix (1 of 2) Never done   COVID-19 Vaccine (3 - Booster for Pfizer series) 02/11/2020   MAMMOGRAM  07/29/2021    Colorectal cancer screening: Type of screening: Colonoscopy. Completed 06/23/16. Repeat every 10 years  Mammogram status: due, last completed 07/29/20, patient states appointment is scheduled for exam.  Bone Density status: due, last completed 07/21/17, patient plans on checking on scheduled appointment status.  Lung Cancer Screening: (Low Dose CT Chest recommended if Age 70-80 years, 30 pack-year currently smoking OR have quit w/in 15years.) does qualify.   Lung Cancer Screening Referral: Patient plans on discussing with PCP.  Additional Screening:  Hepatitis C Screening: does qualify; Completed 01/25/16  Vision Screening: Recommended annual ophthalmology exams for early detection of glaucoma and other disorders of the eye. Is the patient up to date with their annual eye exam?  Yes  Who is the provider or what is the name of the office in which the patient attends annual eye exams? Dr. Pandora Leiter   Dental Screening: Recommended annual dental exams  for proper oral hygiene  Community Resource Referral / Chronic Care Management: CRR required this visit?  No   CCM required this visit?  No      Plan:     I have personally reviewed and noted the following in the patient's chart:   Medical and social history Use of alcohol, tobacco or illicit drugs  Current medications and supplements including opioid prescriptions.  Functional ability and status Nutritional status Physical activity Advanced directives List of other physicians Hospitalizations, surgeries, and ER visits in previous 12 months Vitals Screenings to include cognitive, depression, and falls Referrals and appointments  In addition, I have reviewed and discussed with patient certain preventive protocols, quality metrics, and best practice recommendations. A written personalized care plan for preventive services as well as general preventive health recommendations were provided to patient.   Due to this being a telephonic visit, the after visit summary with patients personalized plan was offered to patient via mail or my-chart. Patient would like to access on my-chart.    Loma Messing, LPN  [id number]   Nurse Health Advisor  Nurse Notes: None

## 2021-08-10 ENCOUNTER — Other Ambulatory Visit: Payer: Self-pay | Admitting: Family Medicine

## 2021-08-10 DIAGNOSIS — N390 Urinary tract infection, site not specified: Secondary | ICD-10-CM

## 2021-08-11 ENCOUNTER — Other Ambulatory Visit: Payer: Medicare HMO

## 2021-08-11 DIAGNOSIS — R2689 Other abnormalities of gait and mobility: Secondary | ICD-10-CM | POA: Diagnosis not present

## 2021-08-11 DIAGNOSIS — M6281 Muscle weakness (generalized): Secondary | ICD-10-CM | POA: Diagnosis not present

## 2021-08-12 ENCOUNTER — Telehealth (HOSPITAL_COMMUNITY): Payer: Self-pay

## 2021-08-12 NOTE — Telephone Encounter (Signed)
Pt agreed to f/u in 6 months with mra. AW  

## 2021-08-13 ENCOUNTER — Other Ambulatory Visit: Payer: Medicare HMO

## 2021-08-13 ENCOUNTER — Ambulatory Visit (INDEPENDENT_AMBULATORY_CARE_PROVIDER_SITE_OTHER): Payer: Medicare HMO

## 2021-08-13 ENCOUNTER — Other Ambulatory Visit: Payer: Self-pay

## 2021-08-13 VITALS — Ht 62.0 in | Wt 175.0 lb

## 2021-08-13 DIAGNOSIS — N390 Urinary tract infection, site not specified: Secondary | ICD-10-CM | POA: Diagnosis not present

## 2021-08-13 DIAGNOSIS — Z0000 Encounter for general adult medical examination without abnormal findings: Secondary | ICD-10-CM

## 2021-08-13 LAB — URINALYSIS, COMPLETE
Bilirubin, UA: NEGATIVE
Glucose, UA: NEGATIVE
Ketones, UA: NEGATIVE
Nitrite, UA: NEGATIVE
Protein,UA: NEGATIVE
RBC, UA: NEGATIVE
Specific Gravity, UA: 1.025 (ref 1.005–1.030)
Urobilinogen, Ur: 0.2 mg/dL (ref 0.2–1.0)
pH, UA: 5.5 (ref 5.0–7.5)

## 2021-08-13 LAB — MICROSCOPIC EXAMINATION: Bacteria, UA: NONE SEEN

## 2021-08-13 NOTE — Patient Instructions (Signed)
[name] [name] , Thank you for taking time to complete your Medicare Wellness Visit. I appreciate your ongoing commitment to your health goals. Please review the following plan we discussed and let me know if I can assist you in the future.   Screening recommendations/referrals: Colonoscopy: up to date, completed 06/23/16, due 06/23/26 Mammogram: due, last exam 07/29/20, per our conversation appointment scheduled Bone Density: due, last exam 07/21/17, per our conversation you will check appointment  Recommended yearly ophthalmology/optometry visit for glaucoma screening and checkup Recommended yearly dental visit for hygiene and checkup  Vaccinations: Influenza vaccine: up to date Pneumococcal vaccine: up to date Tdap vaccine: up to date, completed 08/06/15, due 08/05/25 Shingles vaccine: May obtain vaccine at your local pharmacy.    Covid-19:newest booster available at your local pharmacy if you change your mind  Advanced directives: copy on file  Conditions/risks identified: see problem list  Next appointment: Follow up in one year for your annual wellness visit    Preventive Care 65 Years and Older, Female Preventive care refers to lifestyle choices and visits with your health care provider that can promote health and wellness. What does preventive care include? A yearly physical exam. This is also called an annual well check. Dental exams once or twice a year. Routine eye exams. Ask your health care provider how often you should have your eyes checked. Personal lifestyle choices, including: Daily care of your teeth and gums. Regular physical activity. Eating a healthy diet. Avoiding tobacco and drug use. Limiting alcohol use. Practicing safe sex. Taking low-dose aspirin every day. Taking vitamin and mineral supplements as recommended by your health care provider. What happens during an annual well check? The services and screenings done by your health care provider during your  annual well check will depend on your age, overall health, lifestyle risk factors, and family history of disease. Counseling  Your health care provider may ask you questions about your: Alcohol use. Tobacco use. Drug use. Emotional well-being. Home and relationship well-being. Sexual activity. Eating habits. History of falls. Memory and ability to understand (cognition). Work and work Astronomer. Reproductive health. Screening  You may have the following tests or measurements: Height, weight, and BMI. Blood pressure. Lipid and cholesterol levels. These may be checked every 5 years, or more frequently if you are over 76 years old. Skin check. Lung cancer screening. You may have this screening every year starting at age 88 if you have a 30-pack-year history of smoking and currently smoke or have quit within the past 15 years. Fecal occult blood test (FOBT) of the stool. You may have this test every year starting at age 38. Flexible sigmoidoscopy or colonoscopy. You may have a sigmoidoscopy every 5 years or a colonoscopy every 10 years starting at age 110. Hepatitis C blood test. Hepatitis B blood test. Sexually transmitted disease (STD) testing. Diabetes screening. This is done by checking your blood sugar (glucose) after you have not eaten for a while (fasting). You may have this done every 1-3 years. Bone density scan. This is done to screen for osteoporosis. You may have this done starting at age 30. Mammogram. This may be done every 1-2 years. Talk to your health care provider about how often you should have regular mammograms. Talk with your health care provider about your test results, treatment options, and if necessary, the need for more tests. Vaccines  Your health care provider may recommend certain vaccines, such as: Influenza vaccine. This is recommended every year. Tetanus, diphtheria, and acellular pertussis (  Tdap, Td) vaccine. You may need a Td booster every 10  years. Zoster vaccine. You may need this after age 26. Pneumococcal 13-valent conjugate (PCV13) vaccine. One dose is recommended after age 67. Pneumococcal polysaccharide (PPSV23) vaccine. One dose is recommended after age 51. Talk to your health care provider about which screenings and vaccines you need and how often you need them. This information is not intended to replace advice given to you by your health care provider. Make sure you discuss any questions you have with your health care provider. Document Released: 10/09/2015 Document Revised: 06/01/2016 Document Reviewed: 07/14/2015 Elsevier Interactive Patient Education  2017 [employer] Prevention in the Home Falls can cause injuries. They can happen to people of all ages. There are many things you can do to make your home safe and to help prevent falls. What can I do on the outside of my home? Regularly fix the edges of walkways and driveways and fix any cracks. Remove anything that might make you trip as you walk through a door, such as a raised step or threshold. Trim any bushes or trees on the path to your home. Use bright outdoor lighting. Clear any walking paths of anything that might make someone trip, such as rocks or tools. Regularly check to see if handrails are loose or broken. Make sure that both sides of any steps have handrails. Any raised decks and porches should have guardrails on the edges. Have any leaves, snow, or ice cleared regularly. Use sand or salt on walking paths during winter. Clean up any spills in your garage right away. This includes oil or grease spills. What can I do in the bathroom? Use night lights. Install grab bars by the toilet and in the tub and shower. Do not use towel bars as grab bars. Use non-skid mats or decals in the tub or shower. If you need to sit down in the shower, use a plastic, non-slip stool. Keep the floor dry. Clean up any water that spills on the floor as soon as it  happens. Remove soap buildup in the tub or shower regularly. Attach bath mats securely with double-sided non-slip rug tape. Do not have throw rugs and other things on the floor that can make you trip. What can I do in the bedroom? Use night lights. Make sure that you have a light by your bed that is easy to reach. Do not use any sheets or blankets that are too big for your bed. They should not hang down onto the floor. Have a firm chair that has side arms. You can use this for support while you get dressed. Do not have throw rugs and other things on the floor that can make you trip. What can I do in the kitchen? Clean up any spills right away. Avoid walking on wet floors. Keep items that you use a lot in easy-to-reach places. If you need to reach something above you, use a strong step stool that has a grab bar. Keep electrical cords out of the way. Do not use floor polish or wax that makes floors slippery. If you must use wax, use non-skid floor wax. Do not have throw rugs and other things on the floor that can make you trip. What can I do with my stairs? Do not leave any items on the stairs. Make sure that there are handrails on both sides of the stairs and use them. Fix handrails that are broken or loose. Make sure that handrails are  as long as the stairways. Check any carpeting to make sure that it is firmly attached to the stairs. Fix any carpet that is loose or worn. Avoid having throw rugs at the top or bottom of the stairs. If you do have throw rugs, attach them to the floor with carpet tape. Make sure that you have a light switch at the top of the stairs and the bottom of the stairs. If you do not have them, ask someone to add them for you. What else can I do to help prevent falls? Wear shoes that: Do not have high heels. Have rubber bottoms. Are comfortable and fit you well. Are closed at the toe. Do not wear sandals. If you use a stepladder: Make sure that it is fully opened.  Do not climb a closed stepladder. Make sure that both sides of the stepladder are locked into place. Ask someone to hold it for you, if possible. Clearly mark and make sure that you can see: Any grab bars or handrails. First and last steps. Where the edge of each step is. Use tools that help you move around (mobility aids) if they are needed. These include: Canes. Walkers. Scooters. Crutches. Turn on the lights when you go into a dark area. Replace any light bulbs as soon as they burn out. Set up your furniture so you have a clear path. Avoid moving your furniture around. If any of your floors are uneven, fix them. If there are any pets around you, be aware of where they are. Review your medicines with your doctor. Some medicines can make you feel dizzy. This can increase your chance of falling. Ask your doctor what other things that you can do to help prevent falls. This information is not intended to replace advice given to you by your health care provider. Make sure you discuss any questions you have with your health care provider. Document Released: 07/09/2009 Document Revised: 02/18/2016 Document Reviewed: 10/17/2014 Elsevier Interactive Patient Education  2017 [employer].

## 2021-08-16 ENCOUNTER — Telehealth (INDEPENDENT_AMBULATORY_CARE_PROVIDER_SITE_OTHER): Payer: Medicare HMO | Admitting: Family Medicine

## 2021-08-16 ENCOUNTER — Other Ambulatory Visit (INDEPENDENT_AMBULATORY_CARE_PROVIDER_SITE_OTHER): Payer: Medicare HMO

## 2021-08-16 ENCOUNTER — Other Ambulatory Visit: Payer: Self-pay

## 2021-08-16 ENCOUNTER — Encounter: Payer: Self-pay | Admitting: Family Medicine

## 2021-08-16 ENCOUNTER — Other Ambulatory Visit: Payer: Self-pay | Admitting: Family Medicine

## 2021-08-16 VITALS — BP 156/90 | HR 83 | Temp 97.8°F | Ht 64.0 in

## 2021-08-16 DIAGNOSIS — J22 Unspecified acute lower respiratory infection: Secondary | ICD-10-CM | POA: Diagnosis not present

## 2021-08-16 DIAGNOSIS — Z8673 Personal history of transient ischemic attack (TIA), and cerebral infarction without residual deficits: Secondary | ICD-10-CM | POA: Diagnosis not present

## 2021-08-16 DIAGNOSIS — I5032 Chronic diastolic (congestive) heart failure: Secondary | ICD-10-CM | POA: Diagnosis not present

## 2021-08-16 DIAGNOSIS — J432 Centrilobular emphysema: Secondary | ICD-10-CM

## 2021-08-16 LAB — POC INFLUENZA A&B (BINAX/QUICKVUE)
Influenza A, POC: NEGATIVE
Influenza B, POC: NEGATIVE

## 2021-08-16 NOTE — Progress Notes (Signed)
Orders for curbside testing for today after virtual visit.

## 2021-08-16 NOTE — Assessment & Plan Note (Signed)
With new sick flu contact (daughter), suspect may have influenza this time. I will bring her in for curbside testing (flu and COVID). Otherwise will recommend supportive care measures. Update if not improving as expected.

## 2021-08-16 NOTE — Progress Notes (Signed)
Patient ID: [name] [name], female    DOB: [birth year], 75 y.o.   MRN: [id number]  Virtual visit completed through [employer], a video enabled telemedicine application. Due to national recommendations of social distancing due to COVID-19, a virtual visit is felt to be most appropriate for this patient at this time. Reviewed limitations, risks, security and privacy concerns of performing a virtual visit and the availability of in person appointments. I also reviewed that there may be a patient responsible charge related to this service. The patient agreed to proceed.   Patient location: home Provider location: [facility], office Persons participating in this virtual visit: patient, provider   If any vitals were documented, they were collected by patient at home unless specified below.    BP (!) 156/90   Pulse 83   Temp 97.8 F (36.6 C)   Ht 5\' 4"  (1.626 m)   BMI 30.04 kg/m    CC: cough, congestion  Subjective:   HPI: [name] [name] is a 75 y.o. female presenting on 08/16/2021 for Sore Throat (C/o ST, head congestion, nausea, body aches and chills.  Exposed to pos flu last week by daughter, [name] [name].  Sxs started 08/15/21. Last COVID shot- 12/17/19, flu shot- 06/02/21. )   Was scheduled for CPE today however visit was converted to virtual visit due to acute respiratory illness.   1d h/o sinus congestion headache, low grade fever, nausea, body aches and chills. Some dyspnea and wheezing.   No ear or tooth pain, ST, diarrhea. No chest pain.  Treating with tylenol.   Daughter tested positive for flu last week.   She had short illness 1 month ago presumed influenza given sick exposure - grandchildren had flu A. This did fully resolve. Daughter doesn't think she had influenza at that time as was only sick for 24 hours.      Relevant past medical, surgical, family and social history reviewed and updated as indicated. Interim medical history since our last visit  reviewed. Allergies and medications reviewed and updated. Outpatient Medications Prior to Visit  Medication Sig Dispense Refill   acetaminophen (TYLENOL) 500 MG tablet Take 1 tablet (500 mg total) by mouth every 6 (six) hours as needed for headache.     albuterol (VENTOLIN HFA) 108 (90 Base) MCG/ACT inhaler INHALE 2 PUFFS INTO THE LUNGS EVERY 6 (SIX) HOURS AS NEEDED FOR WHEEZING OR SHORTNESS OF BREATH. 18 g 0   alendronate (FOSAMAX) 70 MG tablet Take with a full glass of water on an empty stomach. 12 tablet 2   aspirin (ASPIRIN 81) 81 MG EC tablet Take 1 tablet (81 mg total) by mouth daily. Swallow whole.     atorvastatin (LIPITOR) 40 MG tablet Take 1 tablet (40 mg total) by mouth daily. 90 tablet 3   Cholecalciferol (VITAMIN D) 50 MCG (2000 UT) tablet Take 2,000 Units by mouth daily.     clopidogrel (PLAVIX) 75 MG tablet TAKE 1 TABLET (75 MG TOTAL) BY MOUTH DAILY. 90 tablet 2   Cranberry-Vitamin C (AZO CRANBERRY URINARY TRACT PO) Take 1 tablet by mouth in the morning and at bedtime.      estradiol (ESTRACE) 0.1 MG/GM vaginal cream Estrogen Cream Instruction Discard applicator Apply pea sized amount to tip of finger to urethra before bed. Wash hands well after application. Use Monday, Wednesday and Friday 42.5 g 12   ezetimibe (ZETIA) 10 MG tablet TAKE 1 TABLET (10 MG TOTAL) BY MOUTH DAILY. 90 tablet 0   fluticasone-salmeterol (ADVAIR  HFA) 115-21 MCG/ACT inhaler Inhale 2 puffs into the lungs 2 (two) times daily. 1 each 12   furosemide (LASIX) 20 MG tablet Take 1 tablet (20 mg total) by mouth daily as needed for fluid. Weight every morning before given 90 tablet 3   gabapentin (NEURONTIN) 100 MG capsule Take 1 capsule (100 mg total) by mouth at bedtime. 90 capsule 2   levothyroxine (SYNTHROID) 50 MCG tablet TAKE 1 TABLET EVERY DAY BEFORE BREAKFAST 90 tablet 0   Melatonin 5 MG CHEW Chew 10 mg by mouth daily.     mirabegron ER (MYRBETRIQ) 50 MG TB24 tablet Take 1 tablet (50 mg total) by mouth daily.  30 tablet 11   Multiple Vitamins-Minerals (MULTIVITAMIN PO) Take 1 tablet by mouth daily. Gummie     nystatin cream (MYCOSTATIN) Apply 1 application topically 2 (two) times daily. 30 g 3   OXYGEN Inhale 2 L/min into the lungs See admin instructions. Inhale 2 L/min of oxygen at bedtime and during the day as needed for shortness of breath     pantoprazole (PROTONIX) 40 MG tablet TAKE 1 TABLET EVERY DAY AS NEEDED 90 tablet 3   Potassium Chloride ER 20 MEQ TBCR Take 1 tablet by mouth 2 (two) times daily as needed (with lasix use). 180 tablet 2   sertraline (ZOLOFT) 100 MG tablet Take 1 tablet (100 mg total) by mouth daily. 90 tablet 2   traMADol (ULTRAM) 50 MG tablet Take 1 tablet (50 mg total) by mouth 2 (two) times daily as needed for moderate pain. 20 tablet 0   traZODone (DESYREL) 50 MG tablet Take 1 tablet (50 mg total) by mouth at bedtime. 90 tablet 1   vitamin C (ASCORBIC ACID) 500 MG tablet Take 500 mg by mouth daily.     No facility-administered medications prior to visit.     Per HPI unless specifically indicated in ROS section below Review of Systems Objective:  BP (!) 156/90   Pulse 83   Temp 97.8 F (36.6 C)   Ht 5\' 4"  (1.626 m)   BMI 30.04 kg/m   Wt Readings from Last 3 Encounters:  08/13/21 175 lb (79.4 kg)  07/28/21 175 lb (79.4 kg)  07/26/21 182 lb (82.6 kg)       Physical exam: Gen: alert, NAD, not ill appearing Pulm: speaks in complete sentences without increased work of breathing Psych: normal mood, normal thought content      Assessment & Plan:   Problem List Items Addressed This Visit     History of lacunar cerebrovascular accident (CVA)   COPD (chronic obstructive pulmonary disease) (HCC)   Chronic diastolic heart failure (HCC)   Acute respiratory infection - Primary    With new sick flu contact (daughter), suspect may have influenza this time. I will bring her in for curbside testing (flu and COVID). Otherwise will recommend supportive care measures.  Update if not improving as expected.         No orders of the defined types were placed in this encounter.  No orders of the defined types were placed in this encounter.   I discussed the assessment and treatment plan with the patient. The patient was provided an opportunity to ask questions and all were answered. The patient agreed with the plan and demonstrated an understanding of the instructions. The patient was advised to call back or seek an in-person evaluation if the symptoms worsen or if the condition fails to improve as anticipated.  Follow up plan: No follow-ups  on file.  Ria Bush, MD

## 2021-08-18 LAB — NOVEL CORONAVIRUS, NAA: SARS-CoV-2, NAA: NOT DETECTED

## 2021-08-18 LAB — SARS-COV-2, NAA 2 DAY TAT

## 2021-08-23 ENCOUNTER — Telehealth: Payer: Self-pay

## 2021-08-23 DIAGNOSIS — R2689 Other abnormalities of gait and mobility: Secondary | ICD-10-CM | POA: Diagnosis not present

## 2021-08-23 DIAGNOSIS — M6281 Muscle weakness (generalized): Secondary | ICD-10-CM | POA: Diagnosis not present

## 2021-08-23 NOTE — Telephone Encounter (Signed)
Noted  

## 2021-08-23 NOTE — Telephone Encounter (Signed)
Daugther called in and got her scheuled  12/7

## 2021-08-23 NOTE — Telephone Encounter (Signed)
Plz call to r/s pt's CPE as soon as possible.

## 2021-08-25 DIAGNOSIS — M6281 Muscle weakness (generalized): Secondary | ICD-10-CM | POA: Diagnosis not present

## 2021-08-25 DIAGNOSIS — R2689 Other abnormalities of gait and mobility: Secondary | ICD-10-CM | POA: Diagnosis not present

## 2021-08-30 ENCOUNTER — Other Ambulatory Visit: Payer: Self-pay

## 2021-08-30 ENCOUNTER — Ambulatory Visit: Payer: Medicare HMO | Admitting: Physician Assistant

## 2021-08-30 ENCOUNTER — Encounter: Payer: Self-pay | Admitting: Physician Assistant

## 2021-08-30 VITALS — BP 133/80 | HR 91 | Ht 60.0 in | Wt 175.0 lb

## 2021-08-30 DIAGNOSIS — N390 Urinary tract infection, site not specified: Secondary | ICD-10-CM

## 2021-08-30 LAB — MICROSCOPIC EXAMINATION: WBC, UA: >30 /hpf — ABNORMAL HIGH (ref 0–5)

## 2021-08-30 LAB — URINALYSIS, COMPLETE
Bilirubin, UA: NEGATIVE
Glucose, UA: NEGATIVE
Nitrite, UA: NEGATIVE
Specific Gravity, UA: 1.025 (ref 1.005–1.030)
Urobilinogen, Ur: 1 mg/dL (ref 0.2–1.0)
pH, UA: 5.5 (ref 5.0–7.5)

## 2021-08-30 MED ORDER — CEFUROXIME AXETIL 250 MG PO TABS: 250.0000 mg | ORAL_TABLET | Freq: Two times a day (BID) | ORAL | 0 refills | Status: AC

## 2021-09-01 ENCOUNTER — Encounter: Payer: Self-pay | Admitting: Family Medicine

## 2021-09-01 ENCOUNTER — Other Ambulatory Visit: Payer: Self-pay

## 2021-09-01 ENCOUNTER — Ambulatory Visit (INDEPENDENT_AMBULATORY_CARE_PROVIDER_SITE_OTHER): Payer: Medicare HMO | Admitting: Family Medicine

## 2021-09-01 VITALS — BP 130/72 | HR 89 | Temp 97.3°F | Ht 59.75 in | Wt 183.3 lb

## 2021-09-01 DIAGNOSIS — N1831 Chronic kidney disease, stage 3a: Secondary | ICD-10-CM

## 2021-09-01 DIAGNOSIS — G4700 Insomnia, unspecified: Secondary | ICD-10-CM

## 2021-09-01 DIAGNOSIS — I720 Aneurysm of carotid artery: Secondary | ICD-10-CM | POA: Diagnosis not present

## 2021-09-01 DIAGNOSIS — E559 Vitamin D deficiency, unspecified: Secondary | ICD-10-CM

## 2021-09-01 DIAGNOSIS — I5032 Chronic diastolic (congestive) heart failure: Secondary | ICD-10-CM | POA: Diagnosis not present

## 2021-09-01 DIAGNOSIS — D696 Thrombocytopenia, unspecified: Secondary | ICD-10-CM | POA: Diagnosis not present

## 2021-09-01 DIAGNOSIS — Z87891 Personal history of nicotine dependence: Secondary | ICD-10-CM

## 2021-09-01 DIAGNOSIS — Z0000 Encounter for general adult medical examination without abnormal findings: Secondary | ICD-10-CM | POA: Diagnosis not present

## 2021-09-01 DIAGNOSIS — I739 Peripheral vascular disease, unspecified: Secondary | ICD-10-CM

## 2021-09-01 DIAGNOSIS — R21 Rash and other nonspecific skin eruption: Secondary | ICD-10-CM

## 2021-09-01 DIAGNOSIS — J432 Centrilobular emphysema: Secondary | ICD-10-CM | POA: Diagnosis not present

## 2021-09-01 DIAGNOSIS — I10 Essential (primary) hypertension: Secondary | ICD-10-CM

## 2021-09-01 DIAGNOSIS — Z0001 Encounter for general adult medical examination with abnormal findings: Secondary | ICD-10-CM

## 2021-09-01 DIAGNOSIS — Z87440 Personal history of urinary (tract) infections: Secondary | ICD-10-CM

## 2021-09-01 DIAGNOSIS — E039 Hypothyroidism, unspecified: Secondary | ICD-10-CM

## 2021-09-01 DIAGNOSIS — E782 Mixed hyperlipidemia: Secondary | ICD-10-CM | POA: Diagnosis not present

## 2021-09-01 DIAGNOSIS — J9611 Chronic respiratory failure with hypoxia: Secondary | ICD-10-CM

## 2021-09-01 DIAGNOSIS — I69351 Hemiplegia and hemiparesis following cerebral infarction affecting right dominant side: Secondary | ICD-10-CM

## 2021-09-01 DIAGNOSIS — F331 Major depressive disorder, recurrent, moderate: Secondary | ICD-10-CM

## 2021-09-01 DIAGNOSIS — M810 Age-related osteoporosis without current pathological fracture: Secondary | ICD-10-CM

## 2021-09-01 DIAGNOSIS — L304 Erythema intertrigo: Secondary | ICD-10-CM

## 2021-09-01 LAB — CBC WITH DIFFERENTIAL/PLATELET
Basophils Absolute: 0.1 10*3/uL (ref 0.0–0.1)
Basophils Relative: 1.1 % (ref 0.0–3.0)
Eosinophils Absolute: 0.1 10*3/uL (ref 0.0–0.7)
Eosinophils Relative: 1.6 % (ref 0.0–5.0)
HCT: 41.8 % (ref 36.0–46.0)
Hemoglobin: 13.7 g/dL (ref 12.0–15.0)
Lymphocytes Relative: 15.7 % (ref 12.0–46.0)
Lymphs Abs: 1.2 10*3/uL (ref 0.7–4.0)
MCHC: 32.7 g/dL (ref 30.0–36.0)
MCV: 92.5 fl (ref 78.0–100.0)
Monocytes Absolute: 0.6 10*3/uL (ref 0.1–1.0)
Monocytes Relative: 7.9 % (ref 3.0–12.0)
Neutro Abs: 5.8 10*3/uL (ref 1.4–7.7)
Neutrophils Relative %: 73.7 % (ref 43.0–77.0)
Platelets: 183 10*3/uL (ref 150.0–400.0)
RBC: 4.52 Mil/uL (ref 3.87–5.11)
RDW: 13.4 % (ref 11.5–15.5)
WBC: 7.9 10*3/uL (ref 4.0–10.5)

## 2021-09-01 LAB — LIPID PANEL
Cholesterol: 165 mg/dL (ref 0–200)
HDL: 64.5 mg/dL (ref >39.00)
LDL Cholesterol: 80 mg/dL (ref 0–99)
NonHDL: 100.73
Total CHOL/HDL Ratio: 3
Triglycerides: 104 mg/dL (ref 0.0–149.0)
VLDL: 20.8 mg/dL (ref 0.0–40.0)

## 2021-09-01 LAB — COMPREHENSIVE METABOLIC PANEL
ALT: 13 U/L (ref 0–35)
AST: 19 U/L (ref 0–37)
Albumin: 4.2 g/dL (ref 3.5–5.2)
Alkaline Phosphatase: 80 U/L (ref 39–117)
BUN: 16 mg/dL (ref 6–23)
CO2: 35 mEq/L — ABNORMAL HIGH (ref 19–32)
Calcium: 9.7 mg/dL (ref 8.4–10.5)
Chloride: 100 mEq/L (ref 96–112)
Creatinine, Ser: 0.91 mg/dL (ref 0.40–1.20)
GFR: 61.96 mL/min (ref >60.00)
Glucose, Bld: 93 mg/dL (ref 70–99)
Potassium: 4.8 mEq/L (ref 3.5–5.1)
Sodium: 139 mEq/L (ref 135–145)
Total Bilirubin: 0.4 mg/dL (ref 0.2–1.2)
Total Protein: 7.8 g/dL (ref 6.0–8.3)

## 2021-09-01 LAB — VITAMIN D 25 HYDROXY (VIT D DEFICIENCY, FRACTURES): VITD: 51.25 ng/mL (ref 30.00–100.00)

## 2021-09-01 MED ORDER — FLUCONAZOLE 150 MG PO TABS: 150.0000 mg | ORAL_TABLET | ORAL | 0 refills | Status: DC

## 2021-09-01 NOTE — Patient Instructions (Addendum)
Continue tapering trazodone down to 25mg  nighlty for 2 weeks and if doing well, trial off.  Call cardiology (Dr office) to schedule follow up.  We will refer you to lung cancer screening program.  Check on dose of calcium supplement you're taking  If interested, check with pharmacy about new 2 shot shingles series (shingrix).  For skin rash - take antifungal pill sent to pharmacy. Let [name] [name] know if not improving with this.  Return as needed or in 6 months for follow up visit.   Health Maintenance After Age 28 After age 82, you are at a higher risk for certain long-term diseases and infections as well as injuries from falls. Falls are a major cause of broken bones and head injuries in people who are older than age 30. Getting regular preventive care can help to keep you healthy and well. Preventive care includes getting regular testing and making lifestyle changes as recommended by your health care provider. Talk with your health care provider about: Which screenings and tests you should have. A screening is a test that checks for a disease when you have no symptoms. A diet and exercise plan that is right for you. What should I know about screenings and tests to prevent falls? Screening and testing are the best ways to find a health problem early. Early diagnosis and treatment give you the best chance of managing medical conditions that are common after age 61. Certain conditions and lifestyle choices may make you more likely to have a fall. Your health care provider may recommend: Regular vision checks. Poor vision and conditions such as cataracts can make you more likely to have a fall. If you wear glasses, make sure to get your prescription updated if your vision changes. Medicine review. Work with your health care provider to regularly review all of the medicines you are taking, including over-the-counter medicines. Ask your health care provider about any side effects that may make you more  likely to have a fall. Tell your health care provider if any medicines that you take make you feel dizzy or sleepy. Strength and balance checks. Your health care provider may recommend certain tests to check your strength and balance while standing, walking, or changing positions. Foot health exam. Foot pain and numbness, as well as not wearing proper footwear, can make you more likely to have a fall. Screenings, including: Osteoporosis screening. Osteoporosis is a condition that causes the bones to get weaker and break more easily. Blood pressure screening. Blood pressure changes and medicines to control blood pressure can make you feel dizzy. Depression screening. You may be more likely to have a fall if you have a fear of falling, feel depressed, or feel unable to do activities that you used to do. Alcohol use screening. Using too much alcohol can affect your balance and may make you more likely to have a fall. Follow these instructions at home: Lifestyle Do not drink alcohol if: Your health care provider tells you not to drink. If you drink alcohol: Limit how much you have to: 0-1 drink a day for women. 0-2 drinks a day for men. Know how much alcohol is in your drink. In the [place] one drink equals one 12 oz bottle of beer (355 mL), one 5 oz glass of wine (148 mL), or one 1 oz glass of hard liquor (44 mL). Do not use any products that contain nicotine or tobacco. These products include cigarettes, chewing tobacco, and vaping devices, such as e-cigarettes. If you  need help quitting, ask your health care provider. Activity  Follow a regular exercise program to stay fit. This will help you maintain your balance. Ask your health care provider what types of exercise are appropriate for you. If you need a cane or walker, use it as recommended by your health care provider. Wear supportive shoes that have nonskid soles. Safety  Remove any tripping hazards, such as rugs, cords, and  clutter. Install safety equipment such as grab bars in bathrooms and safety rails on stairs. Keep rooms and walkways well-lit. General instructions Talk with your health care provider about your risks for falling. Tell your health care provider if: You fall. Be sure to tell your health care provider about all falls, even ones that seem minor. You feel dizzy, tiredness (fatigue), or off-balance. Take over-the-counter and prescription medicines only as told by your health care provider. These include supplements. Eat a healthy diet and maintain a healthy weight. A healthy diet includes low-fat dairy products, low-fat (lean) meats, and fiber from whole grains, beans, and lots of fruits and vegetables. Stay current with your vaccines. Schedule regular health, dental, and eye exams. Summary Having a healthy lifestyle and getting preventive care can help to protect your health and wellness after age 19. Screening and testing are the best way to find a health problem early and help you avoid having a fall. Early diagnosis and treatment give you the best chance for managing medical conditions that are more common for people who are older than age 53. Falls are a major cause of broken bones and head injuries in people who are older than age 55. Take precautions to prevent a fall at home. Work with your health care provider to learn what changes you can make to improve your health and wellness and to prevent falls. This information is not intended to replace advice given to you by your health care provider. Make sure you discuss any questions you have with your health care provider. Document Revised: 02/01/2021 Document Reviewed: 02/01/2021 Elsevier Patient Education  2022 [employer].

## 2021-09-01 NOTE — Assessment & Plan Note (Signed)
Preventative protocols reviewed and updated unless pt declined. Discussed healthy diet and lifestyle.  

## 2021-09-01 NOTE — Progress Notes (Signed)
Patient ID: [name] [name], female    DOB: Dec 02, 1945, 75 y.o.   MRN: [id number]  This visit was conducted in person.  BP 130/72   Pulse 89   Temp (!) 97.3 F (36.3 C) (Temporal)   Ht 4' 11.75" (1.518 m)   Wt 183 lb 5 oz (83.2 kg)   SpO2 94%   BMI 36.10 kg/m  (room air)  CC: CPE  Subjective:   HPI: [name] [name] is a 75 y.o. female presenting on 09/01/2021 for Annual Exam (MCR prt 2. Accompanied by daughter, Kenney [name]. )   Saw health advisor last month for medicare wellness visit. Note reviewed.    No results found.  [facility] Office Visit from 06/02/2021 in [facility] at [place]  PHQ-2 Total Score 5       Fall Risk  08/13/2021 08/12/2020 07/08/2019 06/27/2018 12/05/2017  Falls in the past year? 0 1 0 Yes Yes  Number falls in past yr: 0 1 0 2 or more 2 or more  Injury with Fall? 0 1 0 Yes Yes  Comment - Back injury - - -  Risk Factor Category  - - - - High Fall Risk  Risk for fall due to : No Fall Risks - - - History of fall(s);Impaired mobility  Risk for fall due to: Comment - - - - She broke her back when she fell out of bed  Follow up Falls prevention discussed - - - Falls evaluation completed;Education provided;Falls prevention discussed   Now living with daughter Kenney [name]. Stable period with improvement in mood noted - current regimen includes sertraline 10mg  daily, trazodone 50mg  nightly.  She is dependent in ADLs/IADLs.  She continues outpatient PT with benefit.   Neuropathy - did not tolerate trial off gabapentin.  Recent acute respiratory infections x2 over the past 2 months - symptoms have largely resolved.   COPD - continues advair daily. Continues nocturnal supplemental oxygen as well as intermittent during the day as needed with exertion.   Sees cardiology Q6 mo.   Saw urology this week - started on ceftin 7d course for recurrent UTI.   Skin rash to groin not improving despite nystatin twice daily. Very tender and itchy.    Preventative: Colonoscopy 05/2016 for abnormal cologuard - decreased sphincter tone, diverticulosis, no f/u recommended ([facility]).  Pap smear normal 11/2012 - strong fmhx cervical cancer. Decided to age out. Denies pelvic pain or vaginal bleeding.  Mammogram Birads1 07/2020 [facility]) - upcoming appt this month.  Lung cancer screen - 33 PY hx. Last CTA lung 01/2020. Will refer. DEXA Date: 11/2010 T -2.7 spine, -1.9 hip Osteoporosis.  DEXA 06/2017 - T score -2.2 spine, -2.4 hip (osteopenia).  Compliant with vit D and fosamax weekly (started 2013). Continues fosamax 70mg  weekly. Prolia unaffordable. Poor calcium in diet so she takes extra calcium supplement. Update DEXA scan  Flu shot yearly COVID vaccine Pfizer 10/2019, 11/2019, considering booster Pneumovax - 11/2012. Prevnar-13 12/2013  Tdap 2016 Shingrix - discussed Advanced directives discussed - Advanced directive was reviewed and scanned (07/2018). [name] [name] then [name] [name] are HCPOA. Does not want life prolonged if deemed terminal condition. Does want tube feeding however. Wants living will followed above HCPOA. Discussed code status - she would want trial at CPR, pressors, intubation, aware of risks associated with these conditions including but not limited to rib fractures, difficulty weaning off ventilator.  Seat belt use discussed Sunscreen use discussed. No changing moles on the skin.  Ex smoker  quit 2014.  Alcohol - none  Dentist - has dentures  Eye exam Q3 mo - monitoring glaucoma  Bowel - no constipation  Bladder - ongoing urge incontinence, uses depends regularly, sees uro [employer])   Lives with granddaughter and great grandchildren Occupation: retired, was Lawyer Edu: 8th grade Activity: no regular activity, enjoys computer games Diet: some water, fruits/vegetables daily     Relevant past medical, surgical, family and social history reviewed and updated as indicated. Interim medical history since our last visit  reviewed. Allergies and medications reviewed and updated. Outpatient Medications Prior to Visit  Medication Sig Dispense Refill   acetaminophen (TYLENOL) 500 MG tablet Take 1 tablet (500 mg total) by mouth every 6 (six) hours as needed for headache.     albuterol (VENTOLIN HFA) 108 (90 Base) MCG/ACT inhaler INHALE 2 PUFFS INTO THE LUNGS EVERY 6 (SIX) HOURS AS NEEDED FOR WHEEZING OR SHORTNESS OF BREATH. 18 g 0   alendronate (FOSAMAX) 70 MG tablet Take with a full glass of water on an empty stomach. 12 tablet 2   aspirin (ASPIRIN 81) 81 MG EC tablet Take 1 tablet (81 mg total) by mouth daily. Swallow whole.     atorvastatin (LIPITOR) 40 MG tablet Take 1 tablet (40 mg total) by mouth daily. 90 tablet 3   cefUROXime (CEFTIN) 250 MG tablet Take 1 tablet (250 mg total) by mouth 2 (two) times daily with a meal for 7 days. 14 tablet 0   Cholecalciferol (VITAMIN D) 50 MCG (2000 UT) tablet Take 2,000 Units by mouth daily.     clopidogrel (PLAVIX) 75 MG tablet TAKE 1 TABLET (75 MG TOTAL) BY MOUTH DAILY. 90 tablet 2   Cranberry-Vitamin C (AZO CRANBERRY URINARY TRACT PO) Take 1 tablet by mouth in the morning and at bedtime.      estradiol (ESTRACE) 0.1 MG/GM vaginal cream Estrogen Cream Instruction Discard applicator Apply pea sized amount to tip of finger to urethra before bed. Wash hands well after application. Use Monday, Wednesday and Friday 42.5 g 12   ezetimibe (ZETIA) 10 MG tablet TAKE 1 TABLET (10 MG TOTAL) BY MOUTH DAILY. 90 tablet 0   fluticasone-salmeterol (ADVAIR HFA) 115-21 MCG/ACT inhaler Inhale 2 puffs into the lungs 2 (two) times daily. 1 each 12   furosemide (LASIX) 20 MG tablet Take 1 tablet (20 mg total) by mouth daily as needed for fluid. Weight every morning before given 90 tablet 3   gabapentin (NEURONTIN) 100 MG capsule Take 1 capsule (100 mg total) by mouth at bedtime. 90 capsule 2   levothyroxine (SYNTHROID) 50 MCG tablet TAKE 1 TABLET EVERY DAY BEFORE BREAKFAST 90 tablet 0   Melatonin  5 MG CHEW Chew 10 mg by mouth daily.     mirabegron ER (MYRBETRIQ) 50 MG TB24 tablet Take 1 tablet (50 mg total) by mouth daily. 30 tablet 11   Multiple Vitamins-Minerals (MULTIVITAMIN PO) Take 1 tablet by mouth daily. Gummie     nystatin cream (MYCOSTATIN) Apply 1 application topically 2 (two) times daily. 30 g 3   OXYGEN Inhale 2 L/min into the lungs See admin instructions. Inhale 2 L/min of oxygen at bedtime and during the day as needed for shortness of breath     pantoprazole (PROTONIX) 40 MG tablet TAKE 1 TABLET EVERY DAY AS NEEDED 90 tablet 3   Potassium Chloride ER 20 MEQ TBCR Take 1 tablet by mouth 2 (two) times daily as needed (with lasix use). 180 tablet 2   sertraline (ZOLOFT) 100 MG  tablet Take 1 tablet (100 mg total) by mouth daily. 90 tablet 2   traMADol (ULTRAM) 50 MG tablet Take 1 tablet (50 mg total) by mouth 2 (two) times daily as needed for moderate pain. 20 tablet 0   traZODone (DESYREL) 50 MG tablet Take 1 tablet (50 mg total) by mouth at bedtime. 90 tablet 1   vitamin C (ASCORBIC ACID) 500 MG tablet Take 500 mg by mouth daily.     No facility-administered medications prior to visit.     Per HPI unless specifically indicated in ROS section below Review of Systems  Constitutional:  Negative for activity change, appetite change, chills, fatigue, fever and unexpected weight change.  HENT:  Negative for hearing loss.   Eyes:  Negative for visual disturbance.  Respiratory:  Negative for cough, chest tightness, shortness of breath and wheezing.   Cardiovascular:  Negative for chest pain, palpitations and leg swelling.  Gastrointestinal:  Negative for abdominal distention, abdominal pain, blood in stool, constipation, diarrhea, nausea and vomiting.  Genitourinary:  Positive for hematuria (recent UTI). Negative for difficulty urinating.  Musculoskeletal:  Negative for arthralgias, myalgias and neck pain.  Skin:  Negative for rash.  Neurological:  Negative for dizziness,  seizures, syncope and headaches.  Hematological:  Negative for adenopathy. Bruises/bleeds easily.  Psychiatric/Behavioral:  Negative for dysphoric mood. The patient is not nervous/anxious.    Objective:  BP 130/72   Pulse 89   Temp (!) 97.3 F (36.3 C) (Temporal)   Ht 4' 11.75" (1.518 m)   Wt 183 lb 5 oz (83.2 kg)   SpO2 94%   BMI 36.10 kg/m   Wt Readings from Last 3 Encounters:  09/01/21 183 lb 5 oz (83.2 kg)  08/30/21 175 lb (79.4 kg)  08/13/21 175 lb (79.4 kg)      Physical Exam Vitals and nursing note reviewed.  Constitutional:      Appearance: Normal appearance. She is not ill-appearing.  HENT:     Head: Normocephalic and atraumatic.     Right Ear: Tympanic membrane, ear canal and external ear normal. There is no impacted cerumen.     Left Ear: Tympanic membrane, ear canal and external ear normal. There is no impacted cerumen.  Eyes:     General:        Right eye: No discharge.        Left eye: No discharge.     Extraocular Movements: Extraocular movements intact.     Conjunctiva/sclera: Conjunctivae normal.     Pupils: Pupils are equal, round, and reactive to light.  Neck:     Thyroid: No thyroid mass or thyromegaly.     Vascular: Carotid bruit (bilateral) present.  Cardiovascular:     Rate and Rhythm: Normal rate and regular rhythm.     Pulses: Normal pulses.     Heart sounds: Normal heart sounds. No murmur heard. Pulmonary:     Effort: Pulmonary effort is normal. No respiratory distress.     Breath sounds: Normal breath sounds. No wheezing, rhonchi or rales.  Abdominal:     General: Bowel sounds are normal. There is no distension.     Palpations: Abdomen is soft. There is no mass.     Tenderness: There is no abdominal tenderness. There is no guarding or rebound.     Hernia: No hernia is present.  Musculoskeletal:     Cervical back: Normal range of motion and neck supple. No rigidity.     Right lower leg: No edema.  Left lower leg: No edema.   Lymphadenopathy:     Cervical: No cervical adenopathy.  Skin:    General: Skin is warm and dry.     Findings: Erythema and rash present.          Comments: Well demarcated erythematous patches to R>L groin with fissure at skin fold on right, no satellite lesions, no coral-red fluorescence on wood's lamp exam.   Neurological:     General: No focal deficit present.     Mental Status: She is alert. Mental status is at baseline.  Psychiatric:        Mood and Affect: Mood normal.        Behavior: Behavior normal.      Results for orders placed or performed in visit on 09/01/21  CBC with Differential/Platelet  Result Value Ref Range   WBC 7.9 4.0 - 10.5 K/uL   RBC 4.52 3.87 - 5.11 Mil/uL   Hemoglobin 13.7 12.0 - 15.0 g/dL   HCT 41.8 36.0 - 46.0 %   MCV 92.5 78.0 - 100.0 fl   MCHC 32.7 30.0 - 36.0 g/dL   RDW 13.4 11.5 - 15.5 %   Platelets 183.0 150.0 - 400.0 K/uL   Neutrophils Relative % 73.7 43.0 - 77.0 %   Lymphocytes Relative 15.7 12.0 - 46.0 %   Monocytes Relative 7.9 3.0 - 12.0 %   Eosinophils Relative 1.6 0.0 - 5.0 %   Basophils Relative 1.1 0.0 - 3.0 %   Neutro Abs 5.8 1.4 - 7.7 K/uL   Lymphs Abs 1.2 0.7 - 4.0 K/uL   Monocytes Absolute 0.6 0.1 - 1.0 K/uL   Eosinophils Absolute 0.1 0.0 - 0.7 K/uL   Basophils Absolute 0.1 0.0 - 0.1 K/uL  Lipid panel  Result Value Ref Range   Cholesterol 165 0 - 200 mg/dL   Triglycerides 104.0 0.0 - 149.0 mg/dL   HDL 64.50 >39.00 mg/dL   VLDL 20.8 0.0 - 40.0 mg/dL   LDL Cholesterol 80 0 - 99 mg/dL   Total CHOL/HDL Ratio 3    NonHDL 100.73   Comprehensive metabolic panel  Result Value Ref Range   Sodium 139 135 - 145 mEq/L   Potassium 4.8 3.5 - 5.1 mEq/L   Chloride 100 96 - 112 mEq/L   CO2 35 (H) 19 - 32 mEq/L   Glucose, Bld 93 70 - 99 mg/dL   BUN 16 6 - 23 mg/dL   Creatinine, Ser 0.91 0.40 - 1.20 mg/dL   Total Bilirubin 0.4 0.2 - 1.2 mg/dL   Alkaline Phosphatase 80 39 - 117 U/L   AST 19 0 - 37 U/L   ALT 13 0 - 35 U/L   Total  Protein 7.8 6.0 - 8.3 g/dL   Albumin 4.2 3.5 - 5.2 g/dL   GFR 61.96 >60.00 mL/min   Calcium 9.7 8.4 - 10.5 mg/dL  VITAMIN D 25 Hydroxy (Vit-D Deficiency, Fractures)  Result Value Ref Range   VITD 51.25 30.00 - 100.00 ng/mL    Assessment & Plan:  This visit occurred during the SARS-CoV-2 public health emergency.  Safety protocols were in place, including screening questions prior to the visit, additional usage of staff PPE, and extensive cleaning of exam room while observing appropriate contact time as indicated for disinfecting solutions.   Problem List Items Addressed This Visit     Encounter for general adult medical examination with abnormal findings - Primary (Chronic)    Preventative protocols reviewed and updated unless pt declined. Discussed healthy  diet and lifestyle.       Hypertension    Chronic, stable on lasix 1-2x/wk.       Peripheral vascular disease ([place])   COPD (chronic obstructive pulmonary disease) ([place])    Continue advair.       Hyperlipidemia    Chronic, continue atorvastatin and ezetimibe. Update FLP.  The 10-year ASCVD risk score (Arnett DK, et al., 2019) is: 19%   Values used to calculate the score:     Age: 17 years     Sex: Female     Is Non-Hispanic African American: No     Diabetic: No     Tobacco smoker: No     Systolic Blood Pressure: [id number]     Is BP treated: Yes     HDL Cholesterol: 64.5 mg/dL     Total Cholesterol: 165 mg/dL       MDD (major depressive disorder), recurrent episode, moderate (HCC)    Overall stable period on sertraline 100mg  daily + trazodone nightly.       Ex-smoker    Last screening CT was 2021 - will refer back to lung cancer screening program.       Relevant Orders   Ambulatory Referral for Lung Cancer Scre   Osteoporosis    Continue fosamax weekly.  Reviewed recommended calcium intake - she continues 1 calcium tablet daily  Will order updated DEXA.       Relevant Orders   DG Bone Density   Insomnia     Sleeping well on lower trazodone dose - will continue tapering dose as per pt instructions.       Hemiparesis affecting right side as late effect of stroke ([place])    Overall stable period although pt and daughter note worsening imbalance despite HH PT.       Carotid artery aneurysm ([place])    Regularly followed by IR, last seen 07/2021 with stable evaluation       Vitamin D deficiency    Update vit D levels on 2000 IU replacement.       Hypothyroidism (acquired)    Update TSH on levothyroxine 71mcg daily.       Chronic respiratory failure with hypoxia (HCC)    Respiratory status has improved on daily advair, nocturnal O2 supplementation.      Chronic diastolic heart failure (HCC)    Chronic, stable - seems euvolemic      Intertrigo    Presumed, ongoing despite topical therapy. Will Rx oral dflucan. Update if ongoing. Consider empiric erythrasma treatment if not improving despite negative wood's lamp eval       Thrombocytopenia (HCC)    Update CBC      CKD (chronic kidney disease) stage 3, GFR 30-59 ml/min (HCC)    Update labs today      History of recurrent UTIs    Now followed by urology.       Groin rash    Ongoing, not improved with topical nystatin + nystatin powder. Will Rx diflucan 3 wk course, update with effect.         Meds ordered this encounter  Medications   fluconazole (DIFLUCAN) 150 MG tablet    Sig: Take 1 tablet (150 mg total) by mouth once a week.    Dispense:  3 tablet    Refill:  0    Orders Placed This Encounter  Procedures   DG Bone Density    Standing Status:   Future    Standing Expiration Date:  09/02/2022    Scheduling Instructions:     [facility] - to do at same time as mammogram if able    Order Specific Question:   Reason for Exam (SYMPTOM  OR DIAGNOSIS REQUIRED)    Answer:   osteoporosis    Order Specific Question:   Preferred imaging location?    Answer:   External   Ambulatory Referral for Lung Cancer Scre    Referral  Priority:   Routine    Referral Type:   Consultation    Referral Reason:   Specialty Services Required    Number of Visits Requested:   1    Patient instructions: Continue tapering trazodone down to 25mg  nighlty for 2 weeks and if doing well, trial off.  Call cardiology (Dr Donivan Scull office) to schedule follow up.  We will refer [name] to lung cancer screening program.  Check on dose of calcium supplement [name]'re taking  If interested, check with pharmacy about new 2 shot shingles series (shingrix).  For skin rash - take antifungal pill sent to pharmacy. Let us know if not improving with this.  Return as needed or in 6 months for follow up visit.   Follow up plan: Return in about 6 months (around 03/02/2022) for follow up visit.  Ria Bush, MD

## 2021-09-02 LAB — CULTURE, URINE COMPREHENSIVE

## 2021-09-02 NOTE — Assessment & Plan Note (Addendum)
Chronic, stable on lasix 1-2x/wk.

## 2021-09-02 NOTE — Assessment & Plan Note (Addendum)
Chronic, continue atorvastatin and ezetimibe. Update FLP.  The 10-year ASCVD risk score (Arnett DK, et al., 2019) is: 19%   Values used to calculate the score:     Age: 75 years     Sex: Female     Is Non-Hispanic African American: No     Diabetic: No     Tobacco smoker: No     Systolic Blood Pressure: 130 mmHg     Is BP treated: Yes     HDL Cholesterol: 64.5 mg/dL     Total Cholesterol: 165 mg/dL

## 2021-09-02 NOTE — Assessment & Plan Note (Signed)
Overall stable period on sertraline 100mg  daily + trazodone nightly.

## 2021-09-02 NOTE — Assessment & Plan Note (Addendum)
Continue fosamax weekly.  Reviewed recommended calcium intake - she continues 1 calcium tablet daily  Will order updated DEXA.

## 2021-09-02 NOTE — Assessment & Plan Note (Signed)
Last screening CT was 2021 - will refer back to lung cancer screening program.

## 2021-09-02 NOTE — Assessment & Plan Note (Signed)
Respiratory status has improved on daily advair, nocturnal O2 supplementation.

## 2021-09-02 NOTE — Assessment & Plan Note (Signed)
Update CBC. 

## 2021-09-02 NOTE — Assessment & Plan Note (Signed)
Continue gabapentin 100mg  nightly. She had more trouble when tried off.

## 2021-09-02 NOTE — Assessment & Plan Note (Signed)
Regularly followed by IR, last seen 07/2021 with stable evaluation

## 2021-09-02 NOTE — Assessment & Plan Note (Signed)
Overall stable period although pt and daughter note worsening imbalance despite HH PT.

## 2021-09-02 NOTE — Telephone Encounter (Signed)
Faxed order

## 2021-09-02 NOTE — Assessment & Plan Note (Signed)
Update vit D levels on 2000 IU replacement.  

## 2021-09-02 NOTE — Assessment & Plan Note (Signed)
Presumed, ongoing despite topical therapy. Will Rx oral dflucan. Update if ongoing. Consider empiric erythrasma treatment if not improving despite negative wood's lamp eval

## 2021-09-02 NOTE — Assessment & Plan Note (Signed)
Update labs today 

## 2021-09-02 NOTE — Assessment & Plan Note (Signed)
Continue advair

## 2021-09-02 NOTE — Assessment & Plan Note (Signed)
Now followed by urology.

## 2021-09-02 NOTE — Assessment & Plan Note (Addendum)
Sleeping well on lower trazodone dose - will continue tapering dose as per pt instructions.

## 2021-09-02 NOTE — Progress Notes (Signed)
08/30/2021 4:39 PM   [name] [name] [birth year] [id number]  CC: Chief Complaint  Patient presents with   Recurrent UTI   HPI: [name] [name] is a 75 y.o. female with PMH recurrent UTI on vaginal estrogen cream and OAB wet on Myrbetriq 50 mg daily who presents today for evaluation of possible recurrent versus persistent UTI.  She is accompanied today by her daughter, who contributes to HPI.   I saw her in clinic most recently on 07/28/2021 for evaluation of acute UTI.  UA was grossly infected at that time and I started her on empiric Bactrim x5 days.  Urine culture finalized with pansensitive E. coli.  I repeated a UA 2 weeks later to prove resolution of microscopic hematuria.  MH had resolved at that time, but she had some mild pyuria.  Today she reports she feels her urinary symptoms never fully resolved, however they did get significantly better on antibiotics.  She had sudden worsening of her dysuria 3 days ago.  She denies fever, chills, nausea, and vomiting.  In-office UA today positive for trace ketones, 1+ blood, 1+ protein, and 2+ leukocyte esterase; urine microscopy with >30 WBCs/HPF, 3-10 RBCs/HPF, and few bacteria.  PMH: Past Medical History:  Diagnosis Date   (HFpEF) heart failure with preserved ejection fraction ([place])    a. 11/2019 Echo: EF 60-65%, no rwma. Mild LVH. Mild AI, mild to mod AS.   Aneurysm [facility])    She had 2.6 cm dilation of the infrarenal abdominal aorta on 08/01/15 CTA with 5 year [place] recommended   Anxiety    Anxiety and depression    Aortic stenosis    mild-moderate AS 12/23/19 echo   Carotid stenosis    a. 06/2020 Carotid/Cerebral angio: LICA 70, L Vert 50, RCCA 50-60d, RICA 60-70p, 3.3x2.3 RICA aneurysm, RMCA M1 50-70.   CHF (congestive heart failure) (HCC)    Compression fracture of body of thoracic vertebra (HCC)    T 10   Concussion 08/03/2015   COPD (chronic obstructive pulmonary disease) ([place]) 12/2012   spirometry: Pre: FVC 84%, FEV1 69%,  ratio 0.64 consistent with moderate obstruction.   Depression    Fall 08/03/2015   d/c home health 08/2015   Fracture of cervical vertebra, C5 ([place]) 08/06/2015   History of chicken pox    History of stress test    a. 01/2019 MV: No isch/infarct. EF 71%.   Hyperlipidemia    Hypertension    Hypothyroidism    Lower back pain    h/o HNP s/p surgery   Neuropathy    B/L feet   On supplemental oxygen by nasal cannula    at HS and PRN during the day   Osteoarthritis    h/o ruptured disc s/p ESI   Osteoporosis 11/2010   DEXA -2.7 spine, thoracic compression fracture   Peripheral vascular disease ([place])    Smoker    quit 10/2012   Stroke ([place]) 2010   x3 with residual R hemiparesis, s/p R MCA balloon angioplasty (2010)   Wears dentures    upper    Surgical History: Past Surgical History:  Procedure Laterality Date   APPENDECTOMY  1960   CATARACT EXTRACTION     bilateral   [place]   COLONOSCOPY  2004   diverticulosis, no polyps [facility])   COLONOSCOPY  05/2016   decreased sphincter tone, diverticulosis, no f/u recommended ([place])   DEXA  11/2010   T -2.7 spine, -1.9 hip  HIP SURGERY Left 2006   fractured - screws placed   IR ANGIO INTRA EXTRACRAN SEL COM CAROTID INNOMINATE BILAT MOD SED  08/01/2018   IR ANGIO INTRA EXTRACRAN SEL COM CAROTID INNOMINATE BILAT MOD SED  12/10/2018   IR ANGIO INTRA EXTRACRAN SEL COM CAROTID INNOMINATE BILAT MOD SED  06/26/2020   IR ANGIO INTRA EXTRACRAN SEL COM CAROTID INNOMINATE BILAT MOD SED  04/02/2021   IR ANGIO VERTEBRAL SEL SUBCLAVIAN INNOMINATE BILAT MOD SED  04/02/2021   IR ANGIO VERTEBRAL SEL SUBCLAVIAN INNOMINATE UNI L MOD SED  06/26/2020   IR ANGIO VERTEBRAL SEL SUBCLAVIAN INNOMINATE UNI R MOD SED  08/01/2018   IR ANGIO VERTEBRAL SEL SUBCLAVIAN INNOMINATE UNI R MOD SED  12/10/2018   IR ANGIO VERTEBRAL SEL VERTEBRAL UNI L MOD SED  12/10/2018   IR RADIOLOGIST EVAL & MGMT  01/09/2018   KYPHOPLASTY  10/02/2017    Procedure: LUMBAR FOUR KYPHOPLASTY;  Surgeon: Coletta Memos, MD;  Location: [facility] OR;  Service: Neurosurgery;;   KYPHOPLASTY N/A 01/29/2020   Procedure: Thoracic Ten Kyphoplasty;  Surgeon: Coletta Memos, MD;  Location: [facility] OR;  Service: Neurosurgery;  Laterality: N/A;   RADIOLOGY WITH ANESTHESIA N/A 11/11/2015   Procedure: RADIOLOGY WITH ANESTHESIA;  Surgeon: Julieanne Cotton, MD;  Location: [facility] OR;  Service: Radiology;  Laterality: N/A;   RADIOLOGY WITH ANESTHESIA N/A 12/10/2018   Procedure: STENTING;  Surgeon: Julieanne Cotton, MD;  Location: [facility] OR;  Service: Radiology;  Laterality: N/A;    Home Medications:  Allergies as of 08/30/2021       Reactions   Doxycycline Nausea Only, Other (See Comments)   GI upset   Oxycodone Other (See Comments)   Overly sedating, hypotension, decreased oxgyen        Medication List        Accurate as of August 30, 2021 11:59 PM. If you have any questions, ask your nurse or doctor.          acetaminophen 500 MG tablet Commonly known as: TYLENOL Take 1 tablet (500 mg total) by mouth every 6 (six) hours as needed for headache.   albuterol 108 (90 Base) MCG/ACT inhaler Commonly known as: VENTOLIN HFA INHALE 2 PUFFS INTO THE LUNGS EVERY 6 (SIX) HOURS AS NEEDED FOR WHEEZING OR SHORTNESS OF BREATH.   alendronate 70 MG tablet Commonly known as: FOSAMAX Take with a full glass of water on an empty stomach.   aspirin 81 MG EC tablet Commonly known as: Aspirin 81 Take 1 tablet (81 mg total) by mouth daily. Swallow whole.   atorvastatin 40 MG tablet Commonly known as: LIPITOR Take 1 tablet (40 mg total) by mouth daily.   AZO CRANBERRY URINARY TRACT PO Take 1 tablet by mouth in the morning and at bedtime.   cefUROXime 250 MG tablet Commonly known as: CEFTIN Take 1 tablet (250 mg total) by mouth 2 (two) times daily with a meal for 7 days. Started by: Carman Ching, PA-C   clopidogrel 75 MG tablet Commonly known as: PLAVIX TAKE 1 TABLET  (75 MG TOTAL) BY MOUTH DAILY.   estradiol 0.1 MG/GM vaginal cream Commonly known as: ESTRACE Estrogen Cream Instruction Discard applicator Apply pea sized amount to tip of finger to urethra before bed. Wash hands well after application. Use Monday, Wednesday and Friday   ezetimibe 10 MG tablet Commonly known as: ZETIA TAKE 1 TABLET (10 MG TOTAL) BY MOUTH DAILY.   fluticasone-salmeterol 115-21 MCG/ACT inhaler Commonly known as: ADVAIR HFA Inhale 2 puffs into the lungs 2 (  two) times daily.   furosemide 20 MG tablet Commonly known as: LASIX Take 1 tablet (20 mg total) by mouth daily as needed for fluid. Weight every morning before given   gabapentin 100 MG capsule Commonly known as: NEURONTIN Take 1 capsule (100 mg total) by mouth at bedtime.   levothyroxine 50 MCG tablet Commonly known as: SYNTHROID TAKE 1 TABLET EVERY DAY BEFORE BREAKFAST   Melatonin 5 MG Chew Chew 10 mg by mouth daily.   mirabegron ER 50 MG Tb24 tablet Commonly known as: MYRBETRIQ Take 1 tablet (50 mg total) by mouth daily.   MULTIVITAMIN PO Take 1 tablet by mouth daily. Gummie   nystatin cream Commonly known as: MYCOSTATIN Apply 1 application topically 2 (two) times daily.   OXYGEN Inhale 2 L/min into the lungs See admin instructions. Inhale 2 L/min of oxygen at bedtime and during the day as needed for shortness of breath   pantoprazole 40 MG tablet Commonly known as: PROTONIX TAKE 1 TABLET EVERY DAY AS NEEDED   Potassium Chloride ER 20 MEQ Tbcr Take 1 tablet by mouth 2 (two) times daily as needed (with lasix use).   sertraline 100 MG tablet Commonly known as: ZOLOFT Take 1 tablet (100 mg total) by mouth daily.   traMADol 50 MG tablet Commonly known as: ULTRAM Take 1 tablet (50 mg total) by mouth 2 (two) times daily as needed for moderate pain.   traZODone 50 MG tablet Commonly known as: DESYREL Take 1 tablet (50 mg total) by mouth at bedtime.   vitamin C 500 MG tablet Commonly known  as: ASCORBIC ACID Take 500 mg by mouth daily.   Vitamin D 50 MCG (2000 UT) tablet Take 2,000 Units by mouth daily.        Allergies:  Allergies  Allergen Reactions   Doxycycline Nausea Only and Other (See Comments)    GI upset    Oxycodone Other (See Comments)    Overly sedating, hypotension, decreased oxgyen    Family History: Family History  Problem Relation Age of Onset   CAD Mother        MI   Cancer Mother        cervical   CAD Maternal Aunt        MI   CAD Sister        MI   Sudden death Father 65       died in his sleep, chain smoker   Cirrhosis Father    Hypertension Daughter    Diabetes Maternal Aunt    Cancer Maternal Grandmother        cervical    Social History:   reports that she quit smoking about 8 years ago. Her smoking use included cigarettes. She has a 92.00 pack-year smoking history. She has never used smokeless tobacco. She reports that she does not drink alcohol and does not use drugs.  Physical Exam: BP 133/80   Pulse 91   Ht 5' (1.524 m)   Wt 175 lb (79.4 kg)   BMI 34.18 kg/m   Constitutional:  Alert and oriented, no acute distress, nontoxic appearing HEENT: [place], AT Cardiovascular: No clubbing, cyanosis, or edema Respiratory: Normal respiratory effort, no increased work of breathing Skin: No rashes, bruises or suspicious lesions Neurologic: Grossly intact, no focal deficits, moving all 4 extremities Psychiatric: Normal mood and affect  Laboratory Data: Results for orders placed or performed in visit on 08/30/21  CULTURE, URINE COMPREHENSIVE   Specimen: Urine   UR  Result Value  Ref Range   Urine Culture, Comprehensive Final report (A)    Organism ID, Bacteria Staphylococcus aureus (A)    Organism ID, Bacteria Comment    ANTIMICROBIAL SUSCEPTIBILITY Comment   Microscopic Examination   Urine  Result Value Ref Range   WBC, UA >30 (H) 0 - 5 /hpf   RBC 3-10 (A) 0 - 2 /hpf   Epithelial Cells (non renal) 0-10 0 - 10 /hpf    Bacteria, UA Few (A) None seen/Few  Urinalysis, Complete  Result Value Ref Range   Specific Gravity, UA 1.025 1.005 - 1.030   pH, UA 5.5 5.0 - 7.5   Color, UA Yellow Yellow   Appearance Ur Cloudy (A) Clear   Leukocytes,UA 2+ (A) Negative   Protein,UA 1+ (A) Negative/Trace   Glucose, UA Negative Negative   Ketones, UA Trace (A) Negative   RBC, UA 1+ (A) Negative   Bilirubin, UA Negative Negative   Urobilinogen, Ur 1.0 0.2 - 1.0 mg/dL   Nitrite, UA Negative Negative   Microscopic Examination See below:    Assessment & Plan:   1. Recurrent UTI UA today notable for pyuria and microscopic hematuria consistent with recurrent versus persistent UTI.  Will start empiric cefuroxime and send for culture for further evaluation.  We will plan for repeat UA in 1 week to prove resolution of microscopic hematuria again. - Urinalysis, Complete - CULTURE, URINE COMPREHENSIVE - cefUROXime (CEFTIN) 250 MG tablet; Take 1 tablet (250 mg total) by mouth 2 (two) times daily with a meal for 7 days.  Dispense: 14 tablet; Refill: 0  Return in about 1 week (around 09/06/2021) for Lab visit for UA.  Debroah Loop, PA-C  [facility] [address], [place], [place] [address] [phone]

## 2021-09-02 NOTE — Telephone Encounter (Signed)
Dexa scan ordered. Plz fax to [facility] to see if it can be done on same day as she has upcoming mammogram scheduled

## 2021-09-02 NOTE — Assessment & Plan Note (Signed)
Update TSH on levothyroxine 50mcg daily.  

## 2021-09-02 NOTE — Assessment & Plan Note (Signed)
Chronic, stable - seems euvolemic

## 2021-09-02 NOTE — Assessment & Plan Note (Signed)
Ongoing, not improved with topical nystatin + nystatin powder. Will Rx diflucan 3 wk course, update with effect.

## 2021-09-03 ENCOUNTER — Other Ambulatory Visit: Payer: Self-pay

## 2021-09-03 DIAGNOSIS — N390 Urinary tract infection, site not specified: Secondary | ICD-10-CM

## 2021-09-06 ENCOUNTER — Other Ambulatory Visit: Payer: Medicare HMO

## 2021-09-06 DIAGNOSIS — R2689 Other abnormalities of gait and mobility: Secondary | ICD-10-CM | POA: Diagnosis not present

## 2021-09-06 DIAGNOSIS — M6281 Muscle weakness (generalized): Secondary | ICD-10-CM | POA: Diagnosis not present

## 2021-09-08 DIAGNOSIS — R2689 Other abnormalities of gait and mobility: Secondary | ICD-10-CM | POA: Diagnosis not present

## 2021-09-08 DIAGNOSIS — M6281 Muscle weakness (generalized): Secondary | ICD-10-CM | POA: Diagnosis not present

## 2021-09-14 ENCOUNTER — Emergency Department (HOSPITAL_COMMUNITY): Payer: Medicare HMO

## 2021-09-14 ENCOUNTER — Emergency Department (HOSPITAL_COMMUNITY)
Admission: EM | Admit: 2021-09-14 | Discharge: 2021-09-14 | Disposition: A | Discharge status: Home / Self Care | Payer: Medicare HMO | Attending: Student | Admitting: Student

## 2021-09-14 DIAGNOSIS — J9811 Atelectasis: Secondary | ICD-10-CM | POA: Diagnosis not present

## 2021-09-14 DIAGNOSIS — R600 Localized edema: Secondary | ICD-10-CM | POA: Diagnosis not present

## 2021-09-14 DIAGNOSIS — Z87891 Personal history of nicotine dependence: Secondary | ICD-10-CM | POA: Insufficient documentation

## 2021-09-14 DIAGNOSIS — Z7952 Long term (current) use of systemic steroids: Secondary | ICD-10-CM | POA: Diagnosis not present

## 2021-09-14 DIAGNOSIS — Z8673 Personal history of transient ischemic attack (TIA), and cerebral infarction without residual deficits: Secondary | ICD-10-CM | POA: Insufficient documentation

## 2021-09-14 DIAGNOSIS — J189 Pneumonia, unspecified organism: Secondary | ICD-10-CM | POA: Diagnosis not present

## 2021-09-14 DIAGNOSIS — N183 Chronic kidney disease, stage 3 unspecified: Secondary | ICD-10-CM | POA: Diagnosis not present

## 2021-09-14 DIAGNOSIS — R079 Chest pain, unspecified: Secondary | ICD-10-CM | POA: Diagnosis not present

## 2021-09-14 DIAGNOSIS — R911 Solitary pulmonary nodule: Secondary | ICD-10-CM | POA: Diagnosis not present

## 2021-09-14 DIAGNOSIS — J441 Chronic obstructive pulmonary disease with (acute) exacerbation: Secondary | ICD-10-CM | POA: Insufficient documentation

## 2021-09-14 DIAGNOSIS — E039 Hypothyroidism, unspecified: Secondary | ICD-10-CM | POA: Diagnosis not present

## 2021-09-14 DIAGNOSIS — I700 Atherosclerosis of aorta: Secondary | ICD-10-CM | POA: Diagnosis not present

## 2021-09-14 DIAGNOSIS — R0602 Shortness of breath: Secondary | ICD-10-CM | POA: Diagnosis not present

## 2021-09-14 DIAGNOSIS — R059 Cough, unspecified: Secondary | ICD-10-CM | POA: Diagnosis not present

## 2021-09-14 DIAGNOSIS — Z7901 Long term (current) use of anticoagulants: Secondary | ICD-10-CM | POA: Diagnosis not present

## 2021-09-14 DIAGNOSIS — I5032 Chronic diastolic (congestive) heart failure: Secondary | ICD-10-CM | POA: Diagnosis not present

## 2021-09-14 DIAGNOSIS — I130 Hypertensive heart and chronic kidney disease with heart failure and stage 1 through stage 4 chronic kidney disease, or unspecified chronic kidney disease: Secondary | ICD-10-CM | POA: Insufficient documentation

## 2021-09-14 DIAGNOSIS — Z79899 Other long term (current) drug therapy: Secondary | ICD-10-CM | POA: Diagnosis not present

## 2021-09-14 DIAGNOSIS — Z7982 Long term (current) use of aspirin: Secondary | ICD-10-CM | POA: Diagnosis not present

## 2021-09-14 DIAGNOSIS — Z20822 Contact with and (suspected) exposure to covid-19: Secondary | ICD-10-CM | POA: Insufficient documentation

## 2021-09-14 LAB — CBC WITH DIFFERENTIAL/PLATELET
Abs Immature Granulocytes: 0.03 10*3/uL (ref 0.00–0.07)
Basophils Absolute: 0.1 10*3/uL (ref 0.0–0.1)
Basophils Relative: 1 %
Eosinophils Absolute: 0.2 10*3/uL (ref 0.0–0.5)
Eosinophils Relative: 2 %
HCT: 41.3 % (ref 36.0–46.0)
Hemoglobin: 13.3 g/dL (ref 12.0–15.0)
Immature Granulocytes: 0 %
Lymphocytes Relative: 28 %
Lymphs Abs: 2.5 10*3/uL (ref 0.7–4.0)
MCH: 31.3 pg (ref 26.0–34.0)
MCHC: 32.2 g/dL (ref 30.0–36.0)
MCV: 97.2 fL (ref 80.0–100.0)
Monocytes Absolute: 0.7 10*3/uL (ref 0.1–1.0)
Monocytes Relative: 8 %
Neutro Abs: 5.3 10*3/uL (ref 1.7–7.7)
Neutrophils Relative %: 61 %
Platelets: 154 10*3/uL (ref 150–400)
RBC: 4.25 MIL/uL (ref 3.87–5.11)
RDW: 12.4 % (ref 11.5–15.5)
WBC: 8.8 10*3/uL (ref 4.0–10.5)
nRBC: 0 % (ref 0.0–0.2)

## 2021-09-14 LAB — COMPREHENSIVE METABOLIC PANEL
ALT: 14 U/L (ref 0–44)
AST: 21 U/L (ref 15–41)
Albumin: 3.9 g/dL (ref 3.5–5.0)
Alkaline Phosphatase: 82 U/L (ref 38–126)
Anion gap: 8 (ref 5–15)
BUN: 23 mg/dL (ref 8–23)
CO2: 28 mmol/L (ref 22–32)
Calcium: 9.1 mg/dL (ref 8.9–10.3)
Chloride: 102 mmol/L (ref 98–111)
Creatinine, Ser: 0.98 mg/dL (ref 0.44–1.00)
GFR, Estimated: >60 mL/min (ref >60)
Glucose, Bld: 107 mg/dL — ABNORMAL HIGH (ref 70–99)
Potassium: 4 mmol/L (ref 3.5–5.1)
Sodium: 138 mmol/L (ref 135–145)
Total Bilirubin: 0.7 mg/dL (ref 0.3–1.2)
Total Protein: 7.6 g/dL (ref 6.5–8.1)

## 2021-09-14 LAB — TROPONIN I (HIGH SENSITIVITY)
Troponin I (High Sensitivity): 6 ng/L (ref <18)
Troponin I (High Sensitivity): 6 ng/L (ref <18)

## 2021-09-14 LAB — RESP PANEL BY RT-PCR (FLU A&B, COVID) ARPGX2
Influenza A by PCR: NEGATIVE
Influenza B by PCR: NEGATIVE
SARS Coronavirus 2 by RT PCR: NEGATIVE

## 2021-09-14 LAB — BRAIN NATRIURETIC PEPTIDE: B Natriuretic Peptide: 70.2 pg/mL (ref 0.0–100.0)

## 2021-09-14 MED ORDER — METHYLPREDNISOLONE SODIUM SUCC 125 MG IJ SOLR: 125.0000 mg | Freq: Every day | INTRAMUSCULAR | Status: DC

## 2021-09-14 MED ORDER — ALBUTEROL SULFATE HFA 108 (90 BASE) MCG/ACT IN AERS
1.0000 | INHALATION_SPRAY | Freq: Once | RESPIRATORY_TRACT | Status: AC
Start: 2021-09-14 — End: 2021-09-14
  Administered 2021-09-14: 23:00:00 1 via RESPIRATORY_TRACT
  Filled 2021-09-14: qty 6.7

## 2021-09-14 MED ORDER — PREDNISONE 20 MG PO TABS
40.0000 mg | ORAL_TABLET | Freq: Every day | ORAL | 0 refills | Status: AC
Start: 2021-09-14 — End: 2021-09-19

## 2021-09-14 NOTE — ED Provider Notes (Signed)
[facility] EMERGENCY DEPARTMENT Provider Note   CSN: [id number] Arrival date & time: 09/14/21  1800     History Chief Complaint  Patient presents with   Chest Pain   Shortness of Breath    JENNIFERLYNN HODGDON is a 75 y.o. female.  75 y.o female with a PMH of CHF, PVD, COPD presents to the ED with a chief complaint of cough, chest pain that is been ongoing for the past 2 days.  She describes it as a substernal sharpness with radiation to her shoulder that comes and goes.  Patient's daughter is at the bedside, reports patient's blood pressure was slightly elevated with these episodes of chest pain, therefore she gave her a baby aspirin.  She is also had some productive cough, that has worsened in the last couple of days.  She was also given 2 puffs of her Advair inhaler without much improvement in symptoms.  Daughter at the bedside reports checking her oxygen saturations and they were 73% at home.  He does wear oxygen 2 L nasal cannula at night only.  Patient is currently on 20 mg of Lasix as needed for her heart failure.  She does have a previous history of CAD, no prior intervention.  Also strong family history of CAD, no prior blood clots.  No fever, all symptoms have been occurring at rest.    The history is provided by the patient and medical records.  Chest Pain Pain location:  Substernal area Pain quality: sharp   Pain radiates to:  L shoulder Pain severity:  Mild Onset quality:  Sudden Duration:  2 days Timing:  Intermittent Progression:  Unchanged Chronicity:  New Context: at rest   Relieved by:  Aspirin Worsened by:  Coughing Associated symptoms: cough, lower extremity edema and shortness of breath   Associated symptoms: no abdominal pain, no back pain, no fever, no headache, no nausea, no vomiting and no weakness   Shortness of Breath Associated symptoms: chest pain and cough   Associated symptoms: no abdominal pain, no fever, no headaches, no sore  throat and no vomiting       Past Medical History:  Diagnosis Date   (HFpEF) heart failure with preserved ejection fraction ([place])    a. 11/2019 Echo: EF 60-65%, no rwma. Mild LVH. Mild AI, mild to mod AS.   Aneurysm [facility])    She had 2.6 cm dilation of the infrarenal abdominal aorta on 08/01/15 CTA with 5 year [place] recommended   Anxiety    Anxiety and depression    Aortic stenosis    mild-moderate AS 12/23/19 echo   Carotid stenosis    a. 06/2020 Carotid/Cerebral angio: LICA 70, L Vert 50, RCCA 50-60d, RICA 60-70p, 3.3x2.3 RICA aneurysm, RMCA M1 50-70.   CHF (congestive heart failure) (HCC)    Compression fracture of body of thoracic vertebra (HCC)    T 10   Concussion 08/03/2015   COPD (chronic obstructive pulmonary disease) ([place]) 12/2012   spirometry: Pre: FVC 84%, FEV1 69%, ratio 0.64 consistent with moderate obstruction.   Depression    Fall 08/03/2015   d/c home health 08/2015   Fracture of cervical vertebra, C5 ([place]) 08/06/2015   History of chicken pox    History of stress test    a. 01/2019 MV: No isch/infarct. EF 71%.   Hyperlipidemia    Hypertension    Hypothyroidism    Lower back pain    h/o HNP s/p surgery   Neuropathy    B/L  feet   On supplemental oxygen by nasal cannula    at HS and PRN during the day   Osteoarthritis    h/o ruptured disc s/p ESI   Osteoporosis 11/2010   DEXA -2.7 spine, thoracic compression fracture   Peripheral vascular disease (HCC)    Smoker    quit 10/2012   Stroke (HCC) 2010   x3 with residual R hemiparesis, s/p R MCA balloon angioplasty (2010)   Wears dentures    upper    Patient Active Problem List   Diagnosis Date Noted   Groin rash 07/27/2021   History of recurrent UTIs 02/18/2020   Acute midline back pain 12/21/2019   Neuropathy 05/06/2019   CKD (chronic kidney disease) stage 3, GFR 30-59 ml/min (HCC) 01/24/2019   Tinnitus of both ears 11/24/2018   Vision loss of left eye 07/31/2018   Thrombocytopenia (HCC) 07/31/2018   Tremor  07/03/2018   Debility 05/07/2018   Chronic diastolic heart failure (HCC) 01/30/2018   Memory loss 01/30/2018   Peripheral edema 01/30/2018   Intertrigo 01/30/2018   Chronic back pain 11/21/2017   Chronic respiratory failure with hypoxia (HCC) 11/14/2017   Shortness of breath 11/14/2017   Closed compression fracture of fourth lumbar vertebra (HCC) 10/02/2017   Compression fracture of body of thoracic vertebra (HCC) 09/30/2017   Hypothyroidism (acquired) 06/28/2017   Vitamin D deficiency 06/25/2017   Advanced care planning/counseling discussion 01/29/2015   Encounter for general adult medical examination with abnormal findings 01/29/2015   Carotid stenosis 04/03/2014   Carotid artery aneurysm (HCC) 04/03/2014   Recurrent falls 01/02/2014   Hemiparesis affecting right side as late effect of stroke (HCC) 01/02/2014   Insomnia 01/10/2013   Medicare annual wellness visit, subsequent 11/26/2012   Hypertension    History of lacunar cerebrovascular accident (CVA)    Peripheral vascular disease (HCC)    COPD (chronic obstructive pulmonary disease) (HCC)    Hyperlipidemia    Osteoarthritis    MDD (major depressive disorder), recurrent episode, moderate (HCC)    Ex-smoker    Osteoporosis 11/25/2010    Past Surgical History:  Procedure Laterality Date   APPENDECTOMY  1960   CATARACT EXTRACTION     bilateral   CESAREAN SECTION     CHOLECYSTECTOMY  1970   COLONOSCOPY  2004   diverticulosis, no polyps Jarold Motto)   COLONOSCOPY  05/2016   decreased sphincter tone, diverticulosis, no f/u recommended ([place])   DEXA  11/2010   T -2.7 spine, -1.9 hip   HIP SURGERY Left 2006   fractured - screws placed   IR ANGIO INTRA EXTRACRAN SEL COM CAROTID INNOMINATE BILAT MOD SED  08/01/2018   IR ANGIO INTRA EXTRACRAN SEL COM CAROTID INNOMINATE BILAT MOD SED  12/10/2018   IR ANGIO INTRA EXTRACRAN SEL COM CAROTID INNOMINATE BILAT MOD SED  06/26/2020   IR ANGIO INTRA EXTRACRAN SEL COM CAROTID INNOMINATE  BILAT MOD SED  04/02/2021   IR ANGIO VERTEBRAL SEL SUBCLAVIAN INNOMINATE BILAT MOD SED  04/02/2021   IR ANGIO VERTEBRAL SEL SUBCLAVIAN INNOMINATE UNI L MOD SED  06/26/2020   IR ANGIO VERTEBRAL SEL SUBCLAVIAN INNOMINATE UNI R MOD SED  08/01/2018   IR ANGIO VERTEBRAL SEL SUBCLAVIAN INNOMINATE UNI R MOD SED  12/10/2018   IR ANGIO VERTEBRAL SEL VERTEBRAL UNI L MOD SED  12/10/2018   IR RADIOLOGIST EVAL & MGMT  01/09/2018   KYPHOPLASTY  10/02/2017   Procedure: LUMBAR FOUR KYPHOPLASTY;  Surgeon: Coletta Memos, MD;  Location: [facility] OR;  Service: Neurosurgery;;  KYPHOPLASTY N/A 01/29/2020   Procedure: Thoracic Ten Kyphoplasty;  Surgeon: Ashok Pall, MD;  Location: [facility];  Service: Neurosurgery;  Laterality: N/A;   RADIOLOGY WITH ANESTHESIA N/A 11/11/2015   Procedure: RADIOLOGY WITH ANESTHESIA;  Surgeon: Luanne Bras, MD;  Location: [facility];  Service: Radiology;  Laterality: N/A;   RADIOLOGY WITH ANESTHESIA N/A 12/10/2018   Procedure: STENTING;  Surgeon: Luanne Bras, MD;  Location: [facility];  Service: Radiology;  Laterality: N/A;     OB History   No obstetric history on file.     Family History  Problem Relation Age of Onset   CAD Mother        MI   Cancer Mother        cervical   CAD Maternal Aunt        MI   CAD Sister        MI   Sudden death Father 59       died in his sleep, chain smoker   Cirrhosis Father    Hypertension Daughter    Diabetes Maternal Aunt    Cancer Maternal Grandmother        cervical    Social History   Tobacco Use   Smoking status: Former    Packs/day: 2.00    Years: 46.00    Pack years: 92.00    Types: Cigarettes    Quit date: 11/08/2012    Years since quitting: 8.8   Smokeless tobacco: Never  Vaping Use   Vaping Use: Never used  Substance Use Topics   Alcohol use: No    Alcohol/week: 0.0 standard drinks   Drug use: No    Home Medications Prior to Admission medications   Medication Sig Start Date End Date Taking? Authorizing Provider  predniSONE  (DELTASONE) 20 MG tablet Take 2 tablets (40 mg total) by mouth daily for 5 days. 09/14/21 09/19/21 Yes Raliyah Montella, Beverley Fiedler, PA-C  acetaminophen (TYLENOL) 500 MG tablet Take 1 tablet (500 mg total) by mouth every 6 (six) hours as needed for headache. 01/01/19   Ria Bush, MD  albuterol (VENTOLIN HFA) 108 (90 Base) MCG/ACT inhaler INHALE 2 PUFFS INTO THE LUNGS EVERY 6 (SIX) HOURS AS NEEDED FOR WHEEZING OR SHORTNESS OF BREATH. 02/03/20   Ria Bush, MD  alendronate (FOSAMAX) 70 MG tablet Take with a full glass of water on an empty stomach. 01/20/21   Ria Bush, MD  aspirin (ASPIRIN 81) 81 MG EC tablet Take 1 tablet (81 mg total) by mouth daily. Swallow whole. 12/15/18   Ria Bush, MD  atorvastatin (LIPITOR) 40 MG tablet Take 1 tablet (40 mg total) by mouth daily. 02/09/21   Ria Bush, MD  Cholecalciferol (VITAMIN D) 50 MCG (2000 UT) tablet Take 2,000 Units by mouth daily.    [provider]  clopidogrel (PLAVIX) 75 MG tablet TAKE 1 TABLET (75 MG TOTAL) BY MOUTH DAILY. 06/21/21   Ria Bush, MD  Cranberry-Vitamin C (AZO CRANBERRY URINARY TRACT PO) Take 1 tablet by mouth in the morning and at bedtime.     [provider]  estradiol (ESTRACE) 0.1 MG/GM vaginal cream Estrogen Cream Instruction Discard applicator Apply pea sized amount to tip of finger to urethra before bed. Wash hands well after application. Use Monday, Wednesday and Friday 11/06/20   Billey Co, MD  ezetimibe (ZETIA) 10 MG tablet TAKE 1 TABLET (10 MG TOTAL) BY MOUTH DAILY. 06/21/21 09/19/21  Minna Merritts, MD  fluconazole (DIFLUCAN) 150 MG tablet Take 1 tablet (150 mg total)  by mouth once a week. 09/01/21   Ria Bush, MD  fluticasone-salmeterol (ADVAIR HFA) [phone] MCG/ACT inhaler Inhale 2 puffs into the lungs 2 (two) times daily. 06/02/21   Ria Bush, MD  furosemide (LASIX) 20 MG tablet Take 1 tablet (20 mg total) by mouth daily as needed for fluid. Weight every morning  before given 03/15/21   Minna Merritts, MD  gabapentin (NEURONTIN) 100 MG capsule Take 1 capsule (100 mg total) by mouth at bedtime. 06/21/21   Ria Bush, MD  levothyroxine (SYNTHROID) 50 MCG tablet TAKE 1 TABLET EVERY DAY BEFORE BREAKFAST 06/21/21   Ria Bush, MD  Melatonin 5 MG CHEW Chew 10 mg by mouth daily. 06/02/21   Ria Bush, MD  mirabegron ER (MYRBETRIQ) 50 MG TB24 tablet Take 1 tablet (50 mg total) by mouth daily. 12/03/20   Billey Co, MD  Multiple Vitamins-Minerals (MULTIVITAMIN PO) Take 1 tablet by mouth daily. Gummie    [provider]  nystatin cream (MYCOSTATIN) Apply 1 application topically 2 (two) times daily. 07/26/21   Ria Bush, MD  OXYGEN Inhale 2 L/min into the lungs See admin instructions. Inhale 2 L/min of oxygen at bedtime and during the day as needed for shortness of breath    [provider]  pantoprazole (PROTONIX) 40 MG tablet TAKE 1 TABLET EVERY DAY AS NEEDED 09/10/20   Ria Bush, MD  Potassium Chloride ER 20 MEQ TBCR Take 1 tablet by mouth 2 (two) times daily as needed (with lasix use). 06/21/21   Ria Bush, MD  sertraline (ZOLOFT) 100 MG tablet Take 1 tablet (100 mg total) by mouth daily. 12/17/20   Ria Bush, MD  traMADol (ULTRAM) 50 MG tablet Take 1 tablet (50 mg total) by mouth 2 (two) times daily as needed for moderate pain. 06/02/21   Ria Bush, MD  traZODone (DESYREL) 50 MG tablet Take 1 tablet (50 mg total) by mouth at bedtime. 06/02/21   Ria Bush, MD  vitamin C (ASCORBIC ACID) 500 MG tablet Take 500 mg by mouth daily.    [provider]    Allergies    Doxycycline and Oxycodone  Review of Systems   Review of Systems  Constitutional:  Negative for fever.  HENT:  Negative for sore throat.   Respiratory:  Positive for cough and shortness of breath.   Cardiovascular:  Positive for chest pain and leg swelling.  Gastrointestinal:  Negative for abdominal pain,  diarrhea, nausea and vomiting.  Genitourinary:  Negative for flank pain.  Musculoskeletal:  Negative for back pain.  Neurological:  Negative for syncope, weakness, light-headedness and headaches.  All other systems reviewed and are negative.  Physical Exam Updated Vital Signs BP (!) 164/64    Pulse 75    Temp 98.8 F (37.1 C) (Oral)    Resp 19    SpO2 96%   Physical Exam Vitals and nursing note reviewed.  Constitutional:      Appearance: She is well-developed. She is not ill-appearing.  HENT:     Head: Normocephalic and atraumatic.  Cardiovascular:     Rate and Rhythm: Normal rate.     Heart sounds: Normal heart sounds.  Pulmonary:     Effort: Tachypnea present.     Breath sounds: Examination of the right-upper field reveals decreased breath sounds. Examination of the right-middle field reveals decreased breath sounds. Examination of the right-lower field reveals decreased breath sounds. Examination of the left-lower field reveals decreased breath sounds. Decreased breath sounds present.  Chest:  Chest wall: No tenderness.  Abdominal:     Palpations: Abdomen is soft.     Tenderness: There is no abdominal tenderness.  Musculoskeletal:     Right lower leg: No tenderness. 1+ Edema present.     Left lower leg: No tenderness. 1+ Edema present.  Skin:    General: Skin is warm and dry.  Neurological:     Mental Status: She is oriented to person, place, and time.    ED Results / Procedures / Treatments   Labs (all labs ordered are listed, but only abnormal results are displayed) Labs Reviewed  COMPREHENSIVE METABOLIC PANEL - Abnormal; Notable for the following components:      Result Value   Glucose, Bld 107 (*)    All other components within normal limits  RESP PANEL BY RT-PCR (FLU A&B, COVID) [id number]  CBC WITH DIFFERENTIAL/PLATELET  BRAIN NATRIURETIC PEPTIDE  TROPONIN I (HIGH SENSITIVITY)  TROPONIN I (HIGH SENSITIVITY)    EKG EKG Interpretation  Date/Time:  Tuesday  September 14 2021 18:11:25 EST Ventricular Rate:  90 PR Interval:  174 QRS Duration: 72 QT Interval:  360 QTC Calculation: 440 R Axis:   -3 Text Interpretation: Normal sinus rhythm Minimal voltage criteria for LVH, may be normal variant ( R in aVL ) Borderline ECG No significant change since last tracing Confirmed by Isla Pence [phone]) on 09/14/2021 8:53:32 PM  Radiology DG Chest 2 View  Result Date: 09/14/2021 CLINICAL DATA:  Chest pain with cough and shortness of breath. EXAM: CHEST - 2 VIEW COMPARISON:  Feb 18, 2020 FINDINGS: Very mild atelectasis is seen within the bilateral lung bases. There is no evidence of acute infiltrate, pleural effusion or pneumothorax. A stable subcentimeter calcified lung nodule is seen within the lateral aspect of the right upper lobe. The heart size and mediastinal contours are within normal limits. Marked severity calcification of the aortic arch is seen. There is evidence of prior vertebroplasty within the lower thoracic spine. IMPRESSION: Very mild bibasilar atelectasis. Electronically Signed   By: Virgina Norfolk M.D.   On: 09/14/2021 19:05   CT Chest Wo Contrast  Result Date: 09/14/2021 CLINICAL DATA:  Pneumonia. EXAM: CT CHEST WITHOUT CONTRAST TECHNIQUE: Multidetector CT imaging of the chest was performed following the standard protocol without IV contrast. COMPARISON:  Chest x-ray 09/14/2021.  CT angiogram chest 02/14/2020. FINDINGS: Cardiovascular: No significant vascular findings. Normal heart size. No pericardial effusion. There are atherosclerotic calcifications of the coronary arteries and aorta. Mediastinum/Nodes: No enlarged mediastinal or axillary lymph nodes. Thyroid gland, trachea, and esophagus demonstrate no significant findings. Lungs/Pleura: Lungs are clear. No pleural effusion or pneumothorax. Upper Abdomen: Gallbladder surgically absent. There are atherosclerotic calcifications of branch vessels and aorta. Musculoskeletal: T9  vertebroplasty changes are again noted. There is moderate compression deformity of T10 which has minimally progressed. T11 mild compression deformity is unchanged. IMPRESSION: 1. No acute cardiopulmonary process. 2. Moderate compression deformity of T10 has progressed compared to 2021. Please correlate clinically for pain at this level. 3.  Aortic Atherosclerosis (ICD10-I70.0). Electronically Signed   By: Ronney Asters M.D.   On: 09/14/2021 22:55    Procedures Procedures   Medications Ordered in ED Medications  methylPREDNISolone sodium succinate (SOLU-MEDROL) 125 mg/2 mL injection 125 mg (has no administration in time range)  albuterol (VENTOLIN HFA) 108 (90 Base) MCG/ACT inhaler 1-2 puff (1 puff Inhalation Given 09/14/21 2310)    ED Course  I have reviewed the triage vital signs and the nursing notes.  Pertinent labs & imaging  results that were available during my care of the patient were reviewed by me and considered in my medical decision making (see chart for details).    MDM Rules/Calculators/A&P   Patient presents to the ED brought in by daughter with a chief complaint of shortness of breath, chest pain that is been ongoing for the past 2 days.  Of note, patient has had a productive cough for the past 2 days is also worsening.  She has been using her Advair inhaler without any improvement in her symptoms, prior history of COPD currently on 2 L nasal cannula at home only at night.  She has a history of CHF, currently on Lasix but only takes these as needed.  She arrived in the ED with stable vital signs.  No hypoxia, no tachycardia, afebrile.  Her lungs are diminished to auscultation throughout, some mild tachypnea noted.  There is no pain with palpation of her chest.  No pain along the back.  Abdomen is soft and nontender to palpation.  Bilateral lower extremities notable for 1+ pitting edema. Cannot be obtained in triage within normal limits.  Interpretation of the rest of her labs by  me reveal a CBC with no leukocytosis, hemoglobin is within normal limits.  CMP with no electrolyte derangement  Levels within normal limits.  LFTs are unremarkable, continues to deny abdominal pain.  First troponin was 6, will obtain delta.  EKG normal sinus rhythm without any ST elevations, or changes consistent with infarct.  Lower suspicion for ACS, have a stronger suspicion for respiratory component at this time.  X-ray of her chest showed acute findings.  I discussed these results with daughter along with patient at the bedside.  We did discuss obtaining CT chest to rule out any occult pneumonia.  Patient will also have a COVID, influenza test done on today's visit.  CT Chest showed: 1. No acute cardiopulmonary process.  2. Moderate compression deformity of T10 has progressed compared to  2021. Please correlate clinically for pain at this level.  3.  Aortic Atherosclerosis (ICD10-I70.0).      Respiratory panel is negative for influenza A, influenza B, COVID-19.  These results were discussed with daughter along with patient at the bedside.  She was ambulated by nursing staff maintaining her oxygen saturation at 97% without any tachycardia.  CT negative for any pneumonia.  I discussed with him a short course of steroids to treat for COPD exacerbation at this time.  Patient is hemodynamically stable for discharge.  Portions of this note were generated with [employer]. Dictation errors may occur despite best attempts at proofreading.  Final Clinical Impression(s) / ED Diagnoses Final diagnoses:  Shortness of breath  COPD exacerbation ([place])    Rx / DC Orders ED Discharge Orders          Ordered    predniSONE (DELTASONE) 20 MG tablet  Daily        09/14/21 2303             Janeece Fitting, PA-C 09/14/21 2331    Isla Pence, MD 09/17/21 [phone]

## 2021-09-14 NOTE — ED Notes (Signed)
Patient verbalizes understanding of discharge instructions. Prescriptions reviewed. Opportunity for questioning and answers were provided. Armband removed by staff, pt discharged from ED with daughter.

## 2021-09-14 NOTE — ED Notes (Signed)
Patient back from CT.

## 2021-09-14 NOTE — ED Provider Notes (Signed)
Emergency Medicine Provider Triage Evaluation Note  [name] [name] , a 75 y.o. female  was evaluated in triage.  Pt complains of cough, chest pain, and shortness of breath over the past 2 days.  Patient has a history of congestive heart failure, takes Lasix, chronic oxygen use 2 L at night.  No fevers.  Chest pain is described as a tightness.  She has had increased lower extremity swelling noticed last night.  Review of Systems  Positive: Shortness of breath, cough Negative: Fever  Physical Exam  BP (!) 166/91 (BP Location: Right Arm)    Pulse 86    Temp 98.8 F (37.1 C) (Oral)    Resp 18    SpO2 100%  Gen:   Awake, no distress   Resp:  Normal effort  MSK:   Moves extremities without difficulty  Other:    Medical Decision Making  Medically screening exam initiated at 6:11 PM.  Appropriate orders placed.  [name] [name] was informed that the remainder of the evaluation will be completed by another provider, this initial triage assessment does not replace that evaluation, and the importance of remaining in the ED until their evaluation is complete.     Renne Crigler, PA-C 09/14/21 1813    Glendora Score, MD 09/14/21 [phone]

## 2021-09-14 NOTE — ED Triage Notes (Signed)
Pt c/o CP & SHOB x "a couple days," Hx CHF, PVD, COPD wears 2L O2 at night. States leg swelling worse recently. Compliant w home meds

## 2021-09-14 NOTE — ED Notes (Signed)
Pt o2 maintained 97% while ambulating.

## 2021-09-14 NOTE — Discharge Instructions (Addendum)
Your laboratory results are within normal limits today.  Your chest x-ray along with CT did not show any pneumonia.  You have been prescribed a short course of steroids to help with your breathing.  Please take 2 tablets daily for the next 5 days.  Please follow-up with your primary care physician as scheduled.

## 2021-09-14 NOTE — ED Notes (Signed)
Patient transported to CT 

## 2021-09-16 ENCOUNTER — Other Ambulatory Visit: Payer: Medicare HMO

## 2021-09-16 ENCOUNTER — Other Ambulatory Visit: Payer: Self-pay

## 2021-09-16 DIAGNOSIS — N390 Urinary tract infection, site not specified: Secondary | ICD-10-CM | POA: Diagnosis not present

## 2021-09-21 ENCOUNTER — Encounter: Payer: Self-pay | Admitting: *Deleted

## 2021-09-21 LAB — URINALYSIS, COMPLETE
Bilirubin, UA: NEGATIVE
Glucose, UA: NEGATIVE
Nitrite, UA: NEGATIVE
RBC, UA: NEGATIVE
Specific Gravity, UA: 1.025 (ref 1.005–1.030)
Urobilinogen, Ur: 0.2 mg/dL (ref 0.2–1.0)
pH, UA: 5.5 (ref 5.0–7.5)

## 2021-09-21 LAB — MICROSCOPIC EXAMINATION

## 2021-09-22 ENCOUNTER — Encounter: Payer: Self-pay | Admitting: Family Medicine

## 2021-09-22 DIAGNOSIS — R2689 Other abnormalities of gait and mobility: Secondary | ICD-10-CM | POA: Diagnosis not present

## 2021-09-22 DIAGNOSIS — M810 Age-related osteoporosis without current pathological fracture: Secondary | ICD-10-CM | POA: Diagnosis not present

## 2021-09-22 DIAGNOSIS — M6281 Muscle weakness (generalized): Secondary | ICD-10-CM | POA: Diagnosis not present

## 2021-09-22 DIAGNOSIS — Z1231 Encounter for screening mammogram for malignant neoplasm of breast: Secondary | ICD-10-CM | POA: Diagnosis not present

## 2021-09-22 LAB — HM MAMMOGRAPHY

## 2021-09-23 NOTE — Addendum Note (Signed)
Addended by: Ioane Bhola on: 09/23/2021 01:08 PM ° ° Modules accepted: Orders ° °

## 2021-09-24 ENCOUNTER — Encounter: Payer: Self-pay | Admitting: Urology

## 2021-09-26 ENCOUNTER — Other Ambulatory Visit: Payer: Self-pay | Admitting: Family Medicine

## 2021-09-27 NOTE — Progress Notes (Signed)
Cardiology Office Note  Date:  09/28/2021   ID:  [name] [name], [name] [name] [birth year], MRN [id number]  PCP:  Ria Bush, MD   Chief Complaint  Patient presents with   6 month follow up     Patient c/o shortness of breath. Medications reviewed by the patient verbally.     HPI:  [name] is a 76 year old woman with past medical history of CVA Hemiparesis affecting right side  Carotid stenosis COPD, 40-year smoking history Mild aortic valve stenosis CT scan 2019  : 50% right ICA origin stenosis,  50-55% left common carotid artery stenoses,  Severe right subclavian artery origin stenosis, and severe right and moderate left vertebral artery origin stenoses. Who presents for routine follow-up of her shortness of breath, weight gain, hypoxia, LOV 02/2021  Prior clinic visit June 2022 Doing PT 2x a week Does her own exercise  Checks pulse-ox Lasix once a week Eats out 1-2 x a week  Discussion concerning her chronic shortness of breath Previously felt secondary to high weight, deconditioning, long smoking history, COPD on home oxygen, Aortic valve stenosis  On prior office visits we have recommended a sliding scale diuretic regiment She prefers to just take Lasix once a week  Denies anginal symptoms  EKG personally reviewed by myself on todays visit Normal sinus rhythm rate 91 bpm no significant ST-T wave changes  Other past medical history reviewed Echocardiogram March 2021 Left ventricular ejection fraction, by estimation, is 60 to 65%. The  left ventricle has normal function. The left ventricle has no regional  wall motion abnormalities. There is mild left ventricular hypertrophy.  Left ventricular diastolic parameters  are indeterminate.   2. Right ventricular systolic function is mildly reduced. The right  ventricular size is normal. Tricuspid regurgitation signal is inadequate  for assessing PA pressure.   3. The mitral valve is normal in structure. No  evidence of mitral valve  regurgitation.   4. The aortic valve is abnormal. Moderately calcified. Aortic valve  regurgitation is mild. Mild to moderate aortic valve stenosis. Vmax 2.3  m/s, MG 14 mmHg, AVA 1.2 cm^2, DI 0.43   Prior Myoview stress test May 2020  Stress test: low risk 01/2019   PMH:   has a past medical history of (HFpEF) heart failure with preserved ejection fraction ([place]), Aneurysm ([place]), Anxiety, Anxiety and depression, Aortic stenosis, Carotid stenosis, CHF (congestive heart failure) ([place]), Compression fracture of body of thoracic vertebra ([place]), Concussion (08/03/2015), COPD (chronic obstructive pulmonary disease) ([place]) (12/2012), Depression, Fall (08/03/2015), Fracture of cervical vertebra, C5 ([place]) (08/06/2015), History of chicken pox, History of stress test, Hyperlipidemia, Hypertension, Hypothyroidism, Lower back pain, Neuropathy, On supplemental oxygen by nasal cannula, Osteoarthritis, Osteoporosis (11/2010), Peripheral vascular disease ([place]), Smoker, Stroke ([place]) (2010), and Wears dentures.  PSH:    Past Surgical History:  Procedure Laterality Date   APPENDECTOMY  1960   CATARACT EXTRACTION     bilateral   CESAREAN SECTION     CHOLECYSTECTOMY  1970   COLONOSCOPY  2004   diverticulosis, no polyps Sharlett Iles)   COLONOSCOPY  05/2016   decreased sphincter tone, diverticulosis, no f/u recommended ([place])   DEXA  11/2010   T -2.7 spine, -1.9 hip   HIP SURGERY Left 2006   fractured - screws placed   IR ANGIO INTRA EXTRACRAN SEL COM CAROTID INNOMINATE BILAT MOD SED  08/01/2018   IR ANGIO INTRA EXTRACRAN SEL COM CAROTID INNOMINATE BILAT MOD SED  12/10/2018   IR ANGIO INTRA EXTRACRAN SEL COM CAROTID  INNOMINATE BILAT MOD SED  06/26/2020   IR ANGIO INTRA EXTRACRAN SEL COM CAROTID INNOMINATE BILAT MOD SED  04/02/2021   IR ANGIO VERTEBRAL SEL SUBCLAVIAN INNOMINATE BILAT MOD SED  04/02/2021   IR ANGIO VERTEBRAL SEL SUBCLAVIAN INNOMINATE UNI L MOD SED  06/26/2020   IR ANGIO VERTEBRAL  SEL SUBCLAVIAN INNOMINATE UNI R MOD SED  08/01/2018   IR ANGIO VERTEBRAL SEL SUBCLAVIAN INNOMINATE UNI R MOD SED  12/10/2018   IR ANGIO VERTEBRAL SEL VERTEBRAL UNI L MOD SED  12/10/2018   IR RADIOLOGIST EVAL & MGMT  01/09/2018   KYPHOPLASTY  10/02/2017   Procedure: LUMBAR FOUR KYPHOPLASTY;  Surgeon: Ashok Pall, MD;  Location: [place];  Service: Neurosurgery;;   KYPHOPLASTY N/A 01/29/2020   Procedure: Thoracic Ten Kyphoplasty;  Surgeon: Ashok Pall, MD;  Location: [place];  Service: Neurosurgery;  Laterality: N/A;   RADIOLOGY WITH ANESTHESIA N/A 11/11/2015   Procedure: RADIOLOGY WITH ANESTHESIA;  Surgeon: Luanne Bras, MD;  Location: [place];  Service: Radiology;  Laterality: N/A;   RADIOLOGY WITH ANESTHESIA N/A 12/10/2018   Procedure: STENTING;  Surgeon: Luanne Bras, MD;  Location: [place];  Service: Radiology;  Laterality: N/A;    Current Outpatient Medications  Medication Sig Dispense Refill   acetaminophen (TYLENOL) 500 MG tablet Take 1 tablet (500 mg total) by mouth every 6 (six) hours as needed for headache.     albuterol (VENTOLIN HFA) 108 (90 Base) MCG/ACT inhaler INHALE 2 PUFFS INTO THE LUNGS EVERY 6 (SIX) HOURS AS NEEDED FOR WHEEZING OR SHORTNESS OF BREATH. 18 g 0   alendronate (FOSAMAX) 70 MG tablet Take with a full glass of water on an empty stomach. 12 tablet 2   aspirin (ASPIRIN 81) 81 MG EC tablet Take 1 tablet (81 mg total) by mouth daily. Swallow whole.     atorvastatin (LIPITOR) 40 MG tablet Take 1 tablet (40 mg total) by mouth daily. 90 tablet 3   Cholecalciferol (VITAMIN D) 50 MCG (2000 UT) tablet Take 2,000 Units by mouth daily.     clopidogrel (PLAVIX) 75 MG tablet TAKE 1 TABLET (75 MG TOTAL) BY MOUTH DAILY. 90 tablet 2   estradiol (ESTRACE) 0.1 MG/GM vaginal cream Estrogen Cream Instruction Discard applicator Apply pea sized amount to tip of finger to urethra before bed. Wash hands well after application. Use Monday, Wednesday and Friday 42.5 g 12   ezetimibe (ZETIA) 10  MG tablet TAKE 1 TABLET (10 MG TOTAL) BY MOUTH DAILY. 90 tablet 0   fluconazole (DIFLUCAN) 150 MG tablet Take 1 tablet (150 mg total) by mouth once a week. 3 tablet 0   fluticasone-salmeterol (ADVAIR HFA) 115-21 MCG/ACT inhaler Inhale 2 puffs into the lungs 2 (two) times daily. 1 each 12   furosemide (LASIX) 20 MG tablet Take 1 tablet (20 mg total) by mouth daily as needed for fluid. Weight every morning before given 90 tablet 3   gabapentin (NEURONTIN) 100 MG capsule Take 1 capsule (100 mg total) by mouth at bedtime. 90 capsule 2   levothyroxine (SYNTHROID) 50 MCG tablet TAKE 1 TABLET EVERY DAY BEFORE BREAKFAST 90 tablet 0   Melatonin 5 MG CHEW Chew 10 mg by mouth daily.     mirabegron ER (MYRBETRIQ) 50 MG TB24 tablet Take 1 tablet (50 mg total) by mouth daily. 30 tablet 11   Multiple Vitamins-Minerals (MULTIVITAMIN PO) Take 1 tablet by mouth daily. Gummie     nystatin cream (MYCOSTATIN) Apply 1 application topically 2 (two) times daily. 30 g 3  OXYGEN Inhale 2 L/min into the lungs See admin instructions. Inhale 2 L/min of oxygen at bedtime and during the day as needed for shortness of breath     pantoprazole (PROTONIX) 40 MG tablet TAKE 1 TABLET EVERY DAY AS NEEDED 90 tablet 3   Potassium Chloride ER 20 MEQ TBCR Take 1 tablet by mouth 2 (two) times daily as needed (with lasix use). 180 tablet 2   sertraline (ZOLOFT) 100 MG tablet Take 1 tablet (100 mg total) by mouth daily. 90 tablet 2   traMADol (ULTRAM) 50 MG tablet Take 1 tablet (50 mg total) by mouth 2 (two) times daily as needed for moderate pain. 20 tablet 0   vitamin C (ASCORBIC ACID) 500 MG tablet Take 500 mg by mouth daily.     Cranberry-Vitamin C (AZO CRANBERRY URINARY TRACT PO) Take 1 tablet by mouth in the morning and at bedtime.  (Patient not taking: Reported on 09/28/2021)     No current facility-administered medications for this visit.    Allergies:   Doxycycline and Oxycodone   Social History:  The patient  reports that she  quit smoking about 8 years ago. Her smoking use included cigarettes. She has a 92.00 pack-year smoking history. She has never used smokeless tobacco. She reports that she does not drink alcohol and does not use drugs.   Family History:   family history includes CAD in her maternal aunt, mother, and sister; Cancer in her maternal grandmother and mother; Cirrhosis in her father; Diabetes in her maternal aunt; Hypertension in her daughter; Sudden death (age of onset: 36) in her father.    Review of Systems: Review of Systems  Constitutional: Negative.   HENT: Negative.    Respiratory:  Positive for shortness of breath.   Cardiovascular: Negative.   Gastrointestinal: Negative.   Musculoskeletal: Negative.   Neurological: Negative.   Psychiatric/Behavioral: Negative.    All other systems reviewed and are negative.  PHYSICAL EXAM: VS:  BP 100/70 (BP Location: Left Arm, Patient Position: Sitting, Cuff Size: Normal)    Pulse 91    Ht 4\' 8"  (1.422 m)    Wt 180 lb 6 oz (81.8 kg)    BMI 40.44 kg/m  , BMI Body mass index is 40.44 kg/m. Constitutional:  oriented to person, place, and time. No distress.  HENT:  Head: Grossly normal Eyes:  no discharge. No scleral icterus.  Neck: No JVD, no carotid bruits  Cardiovascular: Regular rate and rhythm, no murmurs appreciated Pulmonary/Chest: Clear to auscultation bilaterally, no wheezes or rails Abdominal: Soft.  no distension.  no tenderness.  Musculoskeletal: Normal range of motion Neurological:  normal muscle tone. Coordination normal. No atrophy Skin: Skin warm and dry Psychiatric: normal affect, pleasant  Recent Labs: 06/02/2021: TSH 2.40 09/14/2021: ALT 14; B Natriuretic Peptide 70.2; BUN 23; Creatinine, Ser 0.98; Hemoglobin 13.3; Platelets 154; Potassium 4.0; Sodium 138    Lipid Panel Lab Results  Component Value Date   CHOL 165 09/01/2021   HDL 64.50 09/01/2021   LDLCALC 80 09/01/2021   TRIG 104.0 09/01/2021    Wt Readings from Last  3 Encounters:  09/28/21 180 lb 6 oz (81.8 kg)  09/01/21 183 lb 5 oz (83.2 kg)  08/30/21 175 lb (79.4 kg)     ASSESSMENT AND PLAN:  Problem List Items Addressed This Visit       Cardiology Problems   Hyperlipidemia   Carotid stenosis     Other   COPD (chronic obstructive pulmonary disease) ([place])  Other Visit Diagnoses     Chronic heart failure with preserved ejection fraction (HCC)    -  Primary   PAD (peripheral artery disease) (HCC)       Essential hypertension       Morbid obesity (HCC)       Aortic valve stenosis, etiology of cardiac valve disease unspecified       Hyperlipidemia LDL goal <70          Shortness of breath likely multifactorial Weight deconditioned, Long history of smoking 40 years, COPD Diastolic dysfunction, aortic valve stenosis --- Long discussion concerning her Lasix,  Lasix weekly, sometimes extra doses for worsening shortness of breath ankle swelling abdominal distention Diet has been relatively well controlled weight has been stable  Hyperlipidemia On Lipitor Zetia Numbers at goal, no changes made  PAD Stable carotid disease previously noted on ultrasound and CT Continue statin and Zetia, goal LDL less than 70  COPD On home oxygen at night Suspect she will need oxygen at nighttime She does check her saturations at home  Diastolic CHF Discussed indications for Lasix  Long discussion with family who present with her today  Total encounter time more than 25 minutes  Greater than 50% was spent in counseling and coordination of care with the patient    Signed, Esmond Plants, M.D., Ph.D. [place], [place]

## 2021-09-28 ENCOUNTER — Other Ambulatory Visit: Payer: Self-pay

## 2021-09-28 ENCOUNTER — Ambulatory Visit (INDEPENDENT_AMBULATORY_CARE_PROVIDER_SITE_OTHER): Payer: Medicare HMO | Admitting: Cardiovascular Disease

## 2021-09-28 ENCOUNTER — Encounter: Payer: Self-pay | Admitting: Cardiovascular Disease

## 2021-09-28 VITALS — BP 100/70 | HR 91 | Ht <= 58 in | Wt 180.4 lb

## 2021-09-28 DIAGNOSIS — I6523 Occlusion and stenosis of bilateral carotid arteries: Secondary | ICD-10-CM | POA: Diagnosis not present

## 2021-09-28 DIAGNOSIS — E782 Mixed hyperlipidemia: Secondary | ICD-10-CM

## 2021-09-28 DIAGNOSIS — I350 Nonrheumatic aortic (valve) stenosis: Secondary | ICD-10-CM

## 2021-09-28 DIAGNOSIS — I10 Essential (primary) hypertension: Secondary | ICD-10-CM

## 2021-09-28 DIAGNOSIS — I739 Peripheral vascular disease, unspecified: Secondary | ICD-10-CM | POA: Diagnosis not present

## 2021-09-28 DIAGNOSIS — E785 Hyperlipidemia, unspecified: Secondary | ICD-10-CM

## 2021-09-28 DIAGNOSIS — J432 Centrilobular emphysema: Secondary | ICD-10-CM

## 2021-09-28 DIAGNOSIS — I5032 Chronic diastolic (congestive) heart failure: Secondary | ICD-10-CM

## 2021-09-28 MED ORDER — EZETIMIBE 10 MG PO TABS: 10.0000 mg | ORAL_TABLET | Freq: Every day | ORAL | 3 refills | Status: AC

## 2021-09-28 NOTE — Patient Instructions (Addendum)
Medication Instructions:  No changes  If you need a refill on your cardiac medications before your next appointment, please call your pharmacy.   Lab work: No new labs needed  Testing/Procedures: No new testing needed  Follow-Up: At [facility], you and your health needs are our priority.  As part of our continuing mission to provide you with exceptional heart care, we have created designated Provider Care Teams.  These Care Teams include your primary Cardiologist (physician) and Advanced Practice Providers (APPs -  Physician Assistants and Nurse Practitioners) who all work together to provide you with the care you need, when you need it.  You will need a follow up appointment in 12 months  Providers on your designated Care Team:   Christopher Berge, NP Ryan Dunn, PA-C Cadence Furth, PA-C  COVID-19 Vaccine Information can be found at: [email or web address] For questions related to vaccine distribution or appointments, please email [email or web address] or call [phone].   

## 2021-09-29 DIAGNOSIS — M6281 Muscle weakness (generalized): Secondary | ICD-10-CM | POA: Diagnosis not present

## 2021-09-29 DIAGNOSIS — R2689 Other abnormalities of gait and mobility: Secondary | ICD-10-CM | POA: Diagnosis not present

## 2021-10-04 ENCOUNTER — Encounter: Payer: Self-pay | Admitting: Family Medicine

## 2021-10-04 DIAGNOSIS — R2689 Other abnormalities of gait and mobility: Secondary | ICD-10-CM | POA: Diagnosis not present

## 2021-10-04 DIAGNOSIS — M6281 Muscle weakness (generalized): Secondary | ICD-10-CM | POA: Diagnosis not present

## 2021-10-05 ENCOUNTER — Telehealth: Payer: Self-pay | Admitting: Family Medicine

## 2021-10-05 NOTE — Telephone Encounter (Signed)
Plz notify I received DEXA from [facility] from last month - showing persistent and worsening osteoporosis - recommend continue calcium, vit D and fosamax weekly.  Recommend repeat DEXA in 1-2 yrs.   DEXA 08/2021 - T score -4.8 L forearm, deteriorated.

## 2021-10-05 NOTE — Telephone Encounter (Signed)
Lvm asking pt/pt's daughter, [name] (on dpr) to call back.  Need to relay bone density results and Dr. Timoteo Expose message:  Results/Dr. Timoteo Expose msg: Plz notify I received DEXA from [facility] from last month - showing persistent and worsening osteoporosis - recommend continue calcium, vit D and fosamax weekly.  Recommend repeat DEXA in 1-2 yrs.

## 2021-10-06 ENCOUNTER — Other Ambulatory Visit: Payer: Self-pay | Admitting: Family Medicine

## 2021-10-06 NOTE — Telephone Encounter (Signed)
Lvm asking pt/pt's daughter, Tonya (on dpr) to call back.  Need to relay bone density results and Dr. G's message: ° °Results/Dr. G's msg: °Plz notify I received DEXA from [facility] from last month - showing persistent and worsening osteoporosis - recommend continue calcium, vit D and fosamax weekly.  Recommend repeat DEXA in 1-2 yrs. °

## 2021-10-07 ENCOUNTER — Telehealth: Payer: Self-pay

## 2021-10-07 NOTE — Chronic Care Management (AMB) (Addendum)
Chronic Care Management Pharmacy Assistant   Name: [name] [name]  MRN: [id number] DOB: 09/28/1945  Reason for Encounter: COPD Review  Recent office visits:  09/01/21-PCP-Patient presented for AWV.Discussed screenings, vaccines,update dexa scan, labs ordered. Start diflucan 150mg  take 1 tablet once a week for 3 weeks. Taper off trazodone to 25 mg nightly. Referral for lung cancer screening program. 08/16/21-PCP- Telemedicine-Patient presented for a sore throat. Patient to drive through for flu and covid-19 test. 07/26/21-PCP-Telemedicine-Patient presented for cough and runny nose.start nystatin cream for rash.  Recent consult visits:  09/28/21-Cardiology-Patient presented for follow up heart failure. EKG, no medication changes. 08/30/21-Urology-Patient presented for follow up UTI. Will start empiric cefuroxime and send for culture for further evaluation. Follow up 1 week. 07/28/21-Urology-Patient presented for recurrent UTI. UA ordered. Start bactrim DS 800-160 mg take 1 tablet 2 times daily for 5 days.   Hospital visits:  09/14/21-[facility]-Patient presented for shortness of breath. EKG, CT-scan, labs. Given injection of solu-medrol 125mg , albuterol inhaler- 1-2 puffs. No admission.  Medications: Outpatient Encounter Medications as of 10/07/2021  Medication Sig Note   acetaminophen (TYLENOL) 500 MG tablet Take 1 tablet (500 mg total) by mouth every 6 (six) hours as needed for headache.    albuterol (VENTOLIN HFA) 108 (90 Base) MCG/ACT inhaler INHALE 2 PUFFS INTO THE LUNGS EVERY 6 (SIX) HOURS AS NEEDED FOR WHEEZING OR SHORTNESS OF BREATH.    alendronate (FOSAMAX) 70 MG tablet Take with a full glass of water on an empty stomach.    aspirin (ASPIRIN 81) 81 MG EC tablet Take 1 tablet (81 mg total) by mouth daily. Swallow whole.    atorvastatin (LIPITOR) 40 MG tablet Take 1 tablet (40 mg total) by mouth daily.    Cholecalciferol (VITAMIN D) 50 MCG (2000 UT) tablet Take 2,000 Units by  mouth daily.    clopidogrel (PLAVIX) 75 MG tablet TAKE 1 TABLET (75 MG TOTAL) BY MOUTH DAILY.    Cranberry-Vitamin C (AZO CRANBERRY URINARY TRACT PO) Take 1 tablet by mouth in the morning and at bedtime.  (Patient not taking: Reported on 09/28/2021) 03/26/2021: Pt ran out, needs to get more   estradiol (ESTRACE) 0.1 MG/GM vaginal cream Estrogen Cream Instruction Discard applicator Apply pea sized amount to tip of finger to urethra before bed. Wash hands well after application. Use Monday, Wednesday and Friday    ezetimibe (ZETIA) 10 MG tablet Take 1 tablet (10 mg total) by mouth daily.    fluconazole (DIFLUCAN) 150 MG tablet Take 1 tablet (150 mg total) by mouth once a week.    fluticasone-salmeterol (ADVAIR HFA) 115-21 MCG/ACT inhaler Inhale 2 puffs into the lungs 2 (two) times daily.    furosemide (LASIX) 20 MG tablet Take 1 tablet (20 mg total) by mouth daily as needed for fluid. Weight every morning before given    gabapentin (NEURONTIN) 100 MG capsule Take 1 capsule (100 mg total) by mouth at bedtime.    levothyroxine (SYNTHROID) 50 MCG tablet TAKE 1 TABLET EVERY DAY BEFORE BREAKFAST    Melatonin 5 MG CHEW Chew 10 mg by mouth daily.    mirabegron ER (MYRBETRIQ) 50 MG TB24 tablet Take 1 tablet (50 mg total) by mouth daily.    Multiple Vitamins-Minerals (MULTIVITAMIN PO) Take 1 tablet by mouth daily. Gummie    nystatin cream (MYCOSTATIN) Apply 1 application topically 2 (two) times daily.    OXYGEN Inhale 2 L/min into the lungs See admin instructions. Inhale 2 L/min of oxygen at bedtime and during the  day as needed for shortness of breath    pantoprazole (PROTONIX) 40 MG tablet TAKE 1 TABLET EVERY DAY AS NEEDED    Potassium Chloride ER 20 MEQ TBCR Take 1 tablet by mouth 2 (two) times daily as needed (with lasix use).    sertraline (ZOLOFT) 100 MG tablet TAKE 1 TABLET EVERY DAY    traMADol (ULTRAM) 50 MG tablet Take 1 tablet (50 mg total) by mouth 2 (two) times daily as needed for moderate pain.     vitamin C (ASCORBIC ACID) 500 MG tablet Take 500 mg by mouth daily.    No facility-administered encounter medications on file as of 10/07/2021.    Contacted patient to discuss COPD disease state  Current COPD regimen:    Advair HFA 115-21 mcg/act  Inhale 2 puffs twice daily (Pt taking differently: 2 puffs once a day) Albuterol 108 (90) mcg/act  Inhale 2 puffs every 6 hours as needed for wheezing (pt taking differently 1 puff daily)  Any recent hospitalizations or ED visits since last visit with CPP? Yes  09/14/21 [facility] ED-shortness of breath  denies COPD symptoms, including Increased shortness of breath , Rescue medicine is not helping, Shortness of breath at rest, Symptoms worse with exercise, Symptoms worse at night, and Wheezing   What recent interventions/DTPs have been made by any provider to improve breathing since last visit:  Patient was in need of injection to help with her breathing due to a cough that lasted a few days.The patient is doing physical therapy and this is helping with her breathing .  Have you had exacerbation/flare-up since last visit? No  What do you do when you are short of breath?  Other Patient has home oxygen   Current tobacco use? None- quit 8 years ago    Respiratory Devices/Equipment Do you have a nebulizer? No Do you use a Peak Flow Meter? No Do you use a maintenance inhaler? Yes How often do you forget to use your daily inhaler?  1 time daily  Do you use a rescue inhaler? Yes How often do you use your rescue inhaler?  daily (once) Do you use a spacer with your inhaler? No  Adherence Review: Does the patient have >5 day gap between last estimated fill date for maintenance inhaler medications?  CPP to review  Advair [facility] 115-21 mcg/act 06/30/21 30DS  Upcoming appointments: No appointments scheduled within the next 30 days.  Other - Patient asked to check on her request for a refill on Gabapentin 100mg  to be sent in to [facility] mail order  pharmacy.  Care Gaps: Annual wellness visit in last year? Yes Most Recent BP reading:100/70  91-P 09/28/21   [facility] Medication Name  Last Fill Date  Day supply Atorvastatin 40mg   09/21/21  90   Phil Dopp, CPP notified  Burt Knack, [facility] Clincal Pharmacy Assistant [phone]  I have reviewed the care management and care coordination activities outlined in this encounter and I am certifying that I agree with the content of this note. See addendum. Gabapentin has 2 refills remaining on file. Will have Laia Wiley update patient.  Phil Dopp, PharmD Clinical Pharmacist [facility] [phone]

## 2021-10-08 NOTE — Telephone Encounter (Signed)
ERx 

## 2021-10-08 NOTE — Telephone Encounter (Signed)
Gabapentin Last filled: 06/23/21, #690 Last OV:  09/01/21, AWV prt 2 Next OV:  03/02/22, 6 mo f/u

## 2021-10-08 NOTE — Telephone Encounter (Addendum)
Lvm asking pt/pt's daughter, [name] (on dpr) to call back.  Need to relay bone density results and Dr. Timoteo Expose message:  Results/Dr. Timoteo Expose msg: Plz notify I received DEXA from [facility] from last month - showing persistent and worsening osteoporosis - recommend continue calcium, vit D and fosamax weekly.  Recommend repeat DEXA in 1-2 yrs.  Mailing a letter.

## 2021-10-08 NOTE — Telephone Encounter (Addendum)
Reviewed note, seems patient is taking less Advair than prescribed (once daily instead of 2 puffs TWICE daily). Is there a reason (cost, etc?) This medication should help her breathing so she won't need the albuterol often (rescue inhaler). This will keep her feeling her best. Please let patient know the above and make sure there are no barriers to her refilling the Advair (past due filled 06/2021 30 day supply).

## 2021-10-11 DIAGNOSIS — R2689 Other abnormalities of gait and mobility: Secondary | ICD-10-CM | POA: Diagnosis not present

## 2021-10-11 DIAGNOSIS — M6281 Muscle weakness (generalized): Secondary | ICD-10-CM | POA: Diagnosis not present

## 2021-10-13 ENCOUNTER — Other Ambulatory Visit: Payer: Self-pay | Admitting: Family Medicine

## 2021-10-20 NOTE — Telephone Encounter (Addendum)
Contacted the patient to discuss medications dose and identify any barriers.The patient only uses advair once a day due to the taste it leaves in her mouth. She cannot tolerate a second dose. The patient states the once daily albuterol is comfortable balance with her breathing .  Phil Dopp, CPP notified  Burt Knack, [facility] Clincal Pharmacy Assistant [phone]

## 2021-10-23 ENCOUNTER — Encounter: Payer: Self-pay | Admitting: Family Medicine

## 2021-10-23 DIAGNOSIS — N3946 Mixed incontinence: Secondary | ICD-10-CM | POA: Insufficient documentation

## 2021-10-26 ENCOUNTER — Other Ambulatory Visit: Payer: Self-pay

## 2021-10-26 NOTE — Telephone Encounter (Signed)
Received refill request from [facility] pharmacy on behalf of patient. Nystatin cream-last filled on 07/26/21 # 30 g with 3 refills sent to [facility] pharmacy Fluconazole- last refilled on 09/01/21 # 3 with 0 refills to [facility] pharmacy  LOV 09/01/21 CPE Next appointment on 03/02/22.

## 2021-10-26 NOTE — Telephone Encounter (Signed)
Noted. Updated med list to reflect current dosing. Will review rinsing mouth after each use again at follow up.

## 2021-10-28 ENCOUNTER — Other Ambulatory Visit: Payer: Self-pay | Admitting: Family Medicine

## 2021-10-29 MED ORDER — FLUCONAZOLE 150 MG PO TABS
150.0000 mg | ORAL_TABLET | ORAL | 0 refills | Status: DC
Start: 2021-10-29 — End: 2021-12-09

## 2021-10-29 MED ORDER — NYSTATIN 100000 UNIT/GM EX CREA: 1.0000 "application " | TOPICAL_CREAM | Freq: Two times a day (BID) | CUTANEOUS | 1 refills | Status: AC

## 2021-10-29 NOTE — Telephone Encounter (Signed)
Nystatin refilled. I've refilled diflucan x 2 more weeks but plz call pt's daughter - how did R groin rash do after first 3 wks of diflucan last month?

## 2021-11-01 NOTE — Telephone Encounter (Signed)
Lvm asking pt's daughter, [name] (on dpr), to call back.  Need to relay Dr. Timoteo Expose message and get answer to his question.

## 2021-11-02 ENCOUNTER — Other Ambulatory Visit: Payer: Self-pay | Admitting: Family Medicine

## 2021-11-02 NOTE — Telephone Encounter (Signed)
Lvm asking pt's daughter, Tonya (on dpr), to call back.  Need to relay Dr. G's message and get answer to his question.  °

## 2021-11-03 DIAGNOSIS — M6281 Muscle weakness (generalized): Secondary | ICD-10-CM | POA: Diagnosis not present

## 2021-11-03 DIAGNOSIS — R2689 Other abnormalities of gait and mobility: Secondary | ICD-10-CM | POA: Diagnosis not present

## 2021-11-04 NOTE — Telephone Encounter (Signed)
[facility] called to follow up on rx request

## 2021-11-05 DIAGNOSIS — N390 Urinary tract infection, site not specified: Secondary | ICD-10-CM | POA: Diagnosis not present

## 2021-11-08 NOTE — Telephone Encounter (Addendum)
Per 06/02/21 OV note, pt was tapering off trazodone.  Decreased to 50 mg.  Per 09/01/21 Pt Msg, pt's daughter, [name] (on dpr), states pt had decreased to 25 mg for awhile then had stopped med completely.   Lvm for pt's daughter, [name], asking her to call back.  Need to find out if pt has restarted trazodone.

## 2021-11-09 NOTE — Telephone Encounter (Signed)
Returning phone call °

## 2021-11-09 NOTE — Telephone Encounter (Signed)
Lvm asking pt's daughter, Tonya (on dpr), to call back.  Need to relay Dr. G's message and get answer to his question.  °

## 2021-11-10 NOTE — Telephone Encounter (Signed)
Spoke with pt's daughter, [name], asking how Diflucan helped with previous rash.  She states it helped and cleared up the rash but it comes back.  [name] says they have been applying baby powder in the area after pt bathes, which is helping keep area dry. But will switch to corn starch. Fyi to Dr. Darnell Level.

## 2021-11-10 NOTE — Telephone Encounter (Signed)
Spoke with pt's daughter, [name], asking about trazodone.  States pt has stopped trazodone and does not want a refill.   Request denied.

## 2021-11-16 ENCOUNTER — Encounter: Payer: Self-pay | Admitting: Internal Medicine

## 2021-11-16 ENCOUNTER — Other Ambulatory Visit: Payer: Self-pay

## 2021-11-16 ENCOUNTER — Ambulatory Visit (INDEPENDENT_AMBULATORY_CARE_PROVIDER_SITE_OTHER): Payer: Medicare HMO | Admitting: Internal Medicine

## 2021-11-16 DIAGNOSIS — J9611 Chronic respiratory failure with hypoxia: Secondary | ICD-10-CM | POA: Diagnosis not present

## 2021-11-16 DIAGNOSIS — S060X0A Concussion without loss of consciousness, initial encounter: Secondary | ICD-10-CM

## 2021-11-16 DIAGNOSIS — S300XXA Contusion of lower back and pelvis, initial encounter: Secondary | ICD-10-CM | POA: Insufficient documentation

## 2021-11-16 NOTE — Progress Notes (Signed)
Subjective:    Patient ID: [name] [name], female    DOB: [birth year], 76 y.o.   MRN: [id number]  HPI Here with daughter due to a fall  Bent down while sitting in chair--- 2/18 Then got up to pick up kleenex Fell onto tailbone--then --hit head and left flank  Son in law helped her up--granddaughter got him (33 year old) No loss of conciousness Able to get up with help---went to bed to lie down ("my back was killing me")----low back Needed her oxygen--was feeling SOB. Stayed in bed till daughter got home---a few hours later Daughter got her out of bed--able to walk okay No obvious mental changes No obvious major injuries----ROM okay in all extremities  Noted bruise at top of tailbone yesterday Scattered bruising on arms Daughter decided to bring her in----wants to go back to bed, more SOB, oximetry has dropped at times, complaining about her tailbone More confusion--some headache  She notes pain by tailbone and right hip now Some SOB--more than usual (using the oxygen in the day at times now)  Current Outpatient Medications on File Prior to Visit  Medication Sig Dispense Refill   acetaminophen (TYLENOL) 500 MG tablet Take 1 tablet (500 mg total) by mouth every 6 (six) hours as needed for headache.     albuterol (VENTOLIN HFA) 108 (90 Base) MCG/ACT inhaler INHALE 2 PUFFS INTO THE LUNGS EVERY 6 (SIX) HOURS AS NEEDED FOR WHEEZING OR SHORTNESS OF BREATH. 18 g 0   alendronate (FOSAMAX) 70 MG tablet TAKE 1 TABLET EVERY WEEK WITH A FULL GLASS OF WATER ON AN EMPTY STOMACH. 12 tablet 2   aspirin (ASPIRIN 81) 81 MG EC tablet Take 1 tablet (81 mg total) by mouth daily. Swallow whole.     atorvastatin (LIPITOR) 40 MG tablet Take 1 tablet (40 mg total) by mouth daily. 90 tablet 3   Cholecalciferol (VITAMIN D) 50 MCG (2000 UT) tablet Take 2,000 Units by mouth daily.     clopidogrel (PLAVIX) 75 MG tablet TAKE 1 TABLET (75 MG TOTAL) BY MOUTH DAILY. 90 tablet 2   Cranberry-Vitamin C (AZO  CRANBERRY URINARY TRACT PO) Take 1 tablet by mouth in the morning and at bedtime.     estradiol (ESTRACE) 0.1 MG/GM vaginal cream Estrogen Cream Instruction Discard applicator Apply pea sized amount to tip of finger to urethra before bed. Wash hands well after application. Use Monday, Wednesday and Friday 42.5 g 12   ezetimibe (ZETIA) 10 MG tablet Take 1 tablet (10 mg total) by mouth daily. 90 tablet 3   fluconazole (DIFLUCAN) 150 MG tablet Take 1 tablet (150 mg total) by mouth once a week. 2 tablet 0   fluticasone-salmeterol (ADVAIR HFA) 115-21 MCG/ACT inhaler Inhale 2 puffs into the lungs 2 (two) times daily. (Patient taking differently: Inhale 2 puffs into the lungs 2 (two) times daily. Pt taking once daily due to taste in mouth) 1 each 12   furosemide (LASIX) 20 MG tablet Take 1 tablet (20 mg total) by mouth daily as needed for fluid. Weight every morning before given 90 tablet 3   gabapentin (NEURONTIN) 100 MG capsule TAKE 1 CAPSULE AT BEDTIME 90 capsule 2   levothyroxine (SYNTHROID) 50 MCG tablet TAKE 1 TABLET EVERY DAY BEFORE BREAKFAST 90 tablet 0   Melatonin 5 MG CHEW Chew 10 mg by mouth daily.     mirabegron ER (MYRBETRIQ) 50 MG TB24 tablet Take 1 tablet (50 mg total) by mouth daily. 30 tablet 11   Multiple Vitamins-Minerals (MULTIVITAMIN  PO) Take 1 tablet by mouth daily. Gummie     nystatin cream (MYCOSTATIN) Apply 1 application topically 2 (two) times daily. 60 g 1   OXYGEN Inhale 2 L/min into the lungs See admin instructions. Inhale 2 L/min of oxygen at bedtime and during the day as needed for shortness of breath     pantoprazole (PROTONIX) 40 MG tablet TAKE 1 TABLET EVERY DAY AS NEEDED 90 tablet 3   Potassium Chloride ER 20 MEQ TBCR Take 1 tablet by mouth 2 (two) times daily as needed (with lasix use). 180 tablet 2   sertraline (ZOLOFT) 100 MG tablet TAKE 1 TABLET EVERY DAY 90 tablet 3   traMADol (ULTRAM) 50 MG tablet Take 1 tablet (50 mg total) by mouth 2 (two) times daily as needed for  moderate pain. 20 tablet 0   vitamin C (ASCORBIC ACID) 500 MG tablet Take 500 mg by mouth daily.     No current facility-administered medications on file prior to visit.    Allergies  Allergen Reactions   Doxycycline Nausea Only and Other (See Comments)    GI upset    Oxycodone Other (See Comments)    Overly sedating, hypotension, decreased oxgyen    Past Medical History:  Diagnosis Date   (HFpEF) heart failure with preserved ejection fraction ([place])    a. 11/2019 Echo: EF 60-65%, no rwma. Mild LVH. Mild AI, mild to mod AS.   Aneurysm [facility])    She had 2.6 cm dilation of the infrarenal abdominal aorta on 08/01/15 CTA with 5 year [place] recommended   Anxiety    Anxiety and depression    Aortic stenosis    mild-moderate AS 12/23/19 echo   Carotid stenosis    a. 06/2020 Carotid/Cerebral angio: LICA 70, L Vert 50, RCCA 50-60d, RICA 60-70p, 3.3x2.3 RICA aneurysm, RMCA M1 50-70.   CHF (congestive heart failure) (HCC)    Compression fracture of body of thoracic vertebra (HCC)    T 10   Concussion 08/03/2015   COPD (chronic obstructive pulmonary disease) ([place]) 12/2012   spirometry: Pre: FVC 84%, FEV1 69%, ratio 0.64 consistent with moderate obstruction.   Depression    Fall 08/03/2015   d/c home health 08/2015   Fracture of cervical vertebra, C5 ([place]) 08/06/2015   History of chicken pox    History of stress test    a. 01/2019 MV: No isch/infarct. EF 71%.   Hyperlipidemia    Hypertension    Hypothyroidism    Lower back pain    h/o HNP s/p surgery   Neuropathy    B/L feet   On supplemental oxygen by nasal cannula    at HS and PRN during the day   Osteoarthritis    h/o ruptured disc s/p ESI   Osteoporosis 11/2010   DEXA -2.7 spine, thoracic compression fracture   Peripheral vascular disease ([place])    Smoker    quit 10/2012   Stroke ([place]) 2010   x3 with residual R hemiparesis, s/p R MCA balloon angioplasty (2010)   Wears dentures    upper    Past Surgical History:  Procedure  Laterality Date   APPENDECTOMY  1960   CATARACT EXTRACTION     bilateral   [place]   COLONOSCOPY  2004   diverticulosis, no polyps [facility])   COLONOSCOPY  05/2016   decreased sphincter tone, diverticulosis, no f/u recommended ([place])   DEXA  11/2010   T -2.7 spine, -1.9 hip  HIP SURGERY Left 2006   fractured - screws placed   IR ANGIO INTRA EXTRACRAN SEL COM CAROTID INNOMINATE BILAT MOD SED  08/01/2018   IR ANGIO INTRA EXTRACRAN SEL COM CAROTID INNOMINATE BILAT MOD SED  12/10/2018   IR ANGIO INTRA EXTRACRAN SEL COM CAROTID INNOMINATE BILAT MOD SED  06/26/2020   IR ANGIO INTRA EXTRACRAN SEL COM CAROTID INNOMINATE BILAT MOD SED  04/02/2021   IR ANGIO VERTEBRAL SEL SUBCLAVIAN INNOMINATE BILAT MOD SED  04/02/2021   IR ANGIO VERTEBRAL SEL SUBCLAVIAN INNOMINATE UNI L MOD SED  06/26/2020   IR ANGIO VERTEBRAL SEL SUBCLAVIAN INNOMINATE UNI R MOD SED  08/01/2018   IR ANGIO VERTEBRAL SEL SUBCLAVIAN INNOMINATE UNI R MOD SED  12/10/2018   IR ANGIO VERTEBRAL SEL VERTEBRAL UNI L MOD SED  12/10/2018   IR RADIOLOGIST EVAL & MGMT  01/09/2018   KYPHOPLASTY  10/02/2017   Procedure: LUMBAR FOUR KYPHOPLASTY;  Surgeon: Ashok Pall, MD;  Location: [place];  Service: Neurosurgery;;   KYPHOPLASTY N/A 01/29/2020   Procedure: Thoracic Ten Kyphoplasty;  Surgeon: Ashok Pall, MD;  Location: [place];  Service: Neurosurgery;  Laterality: N/A;   RADIOLOGY WITH ANESTHESIA N/A 11/11/2015   Procedure: RADIOLOGY WITH ANESTHESIA;  Surgeon: Luanne Bras, MD;  Location: [place];  Service: Radiology;  Laterality: N/A;   RADIOLOGY WITH ANESTHESIA N/A 12/10/2018   Procedure: STENTING;  Surgeon: Luanne Bras, MD;  Location: [place];  Service: Radiology;  Laterality: N/A;    Family History  Problem Relation Age of Onset   CAD Mother        MI   Cancer Mother        cervical   CAD Maternal Aunt        MI   CAD Sister        MI   Sudden death Father 63       died in his sleep, chain smoker    Cirrhosis Father    Hypertension Daughter    Diabetes Maternal Aunt    Cancer Maternal Grandmother        cervical    Social History   Socioeconomic History   Marital status: Divorced    Spouse name: Not on file   Number of children: Not on file   Years of education: Not on file   Highest education level: Not on file  Occupational History   Not on file  Tobacco Use   Smoking status: Former    Packs/day: 2.00    Years: 46.00    Pack years: 92.00    Types: Cigarettes    Quit date: 11/08/2012    Years since quitting: 9.0   Smokeless tobacco: Never  Vaping Use   Vaping Use: Never used  Substance and Sexual Activity   Alcohol use: No    Alcohol/week: 0.0 standard drinks   Drug use: No   Sexual activity: Not Currently  Other Topics Concern   Not on file  Social History Narrative   Lives with granddaughter and great grandchildren   Occupation: retired, was Quarry manager   Edu: 8th grade   Activity: no regular activity:   Diet: some water, fruits/vegetables daily   Social Determinants of Radio broadcast assistant Strain: Low Risk    Difficulty of Paying Living Expenses: Not hard at all  Food Insecurity: No Food Insecurity   Worried About Charity fundraiser in the Last Year: Never true   Arboriculturist in the Last Year: Never true  Transportation  Needs: No Transportation Needs   Lack of Transportation (Medical): No   Lack of Transportation (Non-Medical): No  Physical Activity: Sufficiently Active   Days of Exercise per Week: 7 days   Minutes of Exercise per Session: 30 min  Stress: No Stress Concern Present   Feeling of Stress : Not at all  Social Connections: Socially Isolated   Frequency of Communication with Friends and Family: More than three times a week   Frequency of Social Gatherings with Friends and Family: Twice a week   Attends Religious Services: Never   Marine scientist or Organizations: No   Attends Music therapist: Never   Marital  Status: Divorced  Human resources officer Violence: Not At Risk   Fear of Current or Ex-Partner: No   Emotionally Abused: No   Physically Abused: No   Sexually Abused: No   Review of Systems No N/V Eating okay No diplopia or unilateral vision loss No dysphagia No aphasia No focal extremity weakness Recent UTI--dysuria gone and just finished antibiotics    Objective:   Physical Exam Constitutional:      Appearance: Normal appearance.  Cardiovascular:     Rate and Rhythm: Normal rate and regular rhythm.     Heart sounds: No murmur heard.   No gallop.  Pulmonary:     Effort: Pulmonary effort is normal.     Breath sounds: No wheezing or rales.     Comments: Decreased breath sounds but clear Mild left side rib tenderness---minimal Musculoskeletal:     Cervical back: Neck supple.  Lymphadenopathy:     Cervical: No cervical adenopathy.  Skin:    Comments: Moderate ecchymoses over the lower back/coccyx Mild tenderness over lumbar spine  Neurological:     Mental Status: She is alert and oriented to person, place, and time.     Cranial Nerves: Cranial nerves 2-12 are intact.     Comments: No focal weakness Gait is normal but slow---no ataxia           Assessment & Plan:

## 2021-11-16 NOTE — Assessment & Plan Note (Signed)
Significant bruising This was the point of contact--so the most injured part Taking tylenol Has tramadol---for when pain is worse

## 2021-11-16 NOTE — Assessment & Plan Note (Signed)
Her lower oximetry is likely due to hypoventilation due to some rib pain Findings are fairly minimal though--so nothing to suggest rib fracture No exacerbation of the COPD at this point

## 2021-11-16 NOTE — Assessment & Plan Note (Signed)
Doesn't seem to have had major head trauma--but some increase in confusion No focal findings now On ASA and plavix only No need for head imaging since 3 days after fall

## 2021-11-17 DIAGNOSIS — R300 Dysuria: Secondary | ICD-10-CM | POA: Diagnosis not present

## 2021-11-17 DIAGNOSIS — N390 Urinary tract infection, site not specified: Secondary | ICD-10-CM | POA: Diagnosis not present

## 2021-11-26 ENCOUNTER — Telehealth: Payer: Self-pay

## 2021-11-26 ENCOUNTER — Ambulatory Visit: Payer: Medicare HMO | Admitting: Internal Medicine

## 2021-11-26 NOTE — Progress Notes (Signed)
? ? ?Chronic Care Management ?Pharmacy Assistant  ? ?Name: [name] [name]  MRN: [id number] DOB: Sep 28, 1945 ? ?Reason for Encounter: CCM Animator) ?  ?Medications: ?Outpatient Encounter Medications as of 11/26/2021  ?Medication Sig Note  ? acetaminophen (TYLENOL) 500 MG tablet Take 1 tablet (500 mg total) by mouth every 6 (six) hours as needed for headache.   ? albuterol (VENTOLIN HFA) 108 (90 Base) MCG/ACT inhaler INHALE 2 PUFFS INTO THE LUNGS EVERY 6 (SIX) HOURS AS NEEDED FOR WHEEZING OR SHORTNESS OF BREATH.   ? alendronate (FOSAMAX) 70 MG tablet TAKE 1 TABLET EVERY WEEK WITH A FULL GLASS OF WATER ON AN EMPTY STOMACH.   ? aspirin (ASPIRIN 81) 81 MG EC tablet Take 1 tablet (81 mg total) by mouth daily. Swallow whole.   ? atorvastatin (LIPITOR) 40 MG tablet Take 1 tablet (40 mg total) by mouth daily.   ? Cholecalciferol (VITAMIN D) 50 MCG (2000 UT) tablet Take 2,000 Units by mouth daily.   ? clopidogrel (PLAVIX) 75 MG tablet TAKE 1 TABLET (75 MG TOTAL) BY MOUTH DAILY.   ? Cranberry-Vitamin C (AZO CRANBERRY URINARY TRACT PO) Take 1 tablet by mouth in the morning and at bedtime. 03/26/2021: Pt ran out, needs to get more  ? estradiol (ESTRACE) 0.1 MG/GM vaginal cream Estrogen Cream Instruction Discard applicator Apply pea sized amount to tip of finger to urethra before bed. Wash hands well after application. Use Monday, Wednesday and Friday   ? ezetimibe (ZETIA) 10 MG tablet Take 1 tablet (10 mg total) by mouth daily.   ? fluconazole (DIFLUCAN) 150 MG tablet Take 1 tablet (150 mg total) by mouth once a week.   ? fluticasone-salmeterol (ADVAIR HFA) 115-21 MCG/ACT inhaler Inhale 2 puffs into the lungs 2 (two) times daily. (Patient taking differently: Inhale 2 puffs into the lungs 2 (two) times daily. Pt taking once daily due to taste in mouth)   ? furosemide (LASIX) 20 MG tablet Take 1 tablet (20 mg total) by mouth daily as needed for fluid. Weight every morning before given   ? gabapentin (NEURONTIN) 100 MG  capsule TAKE 1 CAPSULE AT BEDTIME   ? levothyroxine (SYNTHROID) 50 MCG tablet TAKE 1 TABLET EVERY DAY BEFORE BREAKFAST   ? Melatonin 5 MG CHEW Chew 10 mg by mouth daily.   ? mirabegron ER (MYRBETRIQ) 50 MG TB24 tablet Take 1 tablet (50 mg total) by mouth daily.   ? Multiple Vitamins-Minerals (MULTIVITAMIN PO) Take 1 tablet by mouth daily. Gummie   ? nystatin cream (MYCOSTATIN) Apply 1 application topically 2 (two) times daily.   ? OXYGEN Inhale 2 L/min into the lungs See admin instructions. Inhale 2 L/min of oxygen at bedtime and during the day as needed for shortness of breath   ? pantoprazole (PROTONIX) 40 MG tablet TAKE 1 TABLET EVERY DAY AS NEEDED   ? Potassium Chloride ER 20 MEQ TBCR Take 1 tablet by mouth 2 (two) times daily as needed (with lasix use).   ? sertraline (ZOLOFT) 100 MG tablet TAKE 1 TABLET EVERY DAY   ? traMADol (ULTRAM) 50 MG tablet Take 1 tablet (50 mg total) by mouth 2 (two) times daily as needed for moderate pain.   ? vitamin C (ASCORBIC ACID) 500 MG tablet Take 500 mg by mouth daily.   ? ?No facility-administered encounter medications on file as of 11/26/2021.  ? ?[name] [name] was contacted to remind of upcoming telephone visit with Al Corpus on 12/01/2021 at 3:00 pm. Patient was reminded to  have all medications, supplements and any blood glucose and blood pressure readings available for review at appointment. Appointment reminder was left with patient's daughter [name] [name]).  ? ?Star Rating Drugs: ?Medication:  Last Fill: Day Supply ?Atorvastatin 40 mg 09/21/2021 90 ? ?Charlene Brooke, CPP notified ? ?Marijean Niemann, RMA ?Clinical Pharmacy Assistant ?[phone] ? ?Time Spent: 10 Minutes ? ? ? ? ?

## 2021-12-01 ENCOUNTER — Ambulatory Visit (INDEPENDENT_AMBULATORY_CARE_PROVIDER_SITE_OTHER): Payer: Medicare HMO | Admitting: Pharmacist

## 2021-12-01 ENCOUNTER — Other Ambulatory Visit: Payer: Self-pay

## 2021-12-01 DIAGNOSIS — I5032 Chronic diastolic (congestive) heart failure: Secondary | ICD-10-CM | POA: Diagnosis not present

## 2021-12-01 DIAGNOSIS — I10 Essential (primary) hypertension: Secondary | ICD-10-CM | POA: Diagnosis not present

## 2021-12-01 DIAGNOSIS — M810 Age-related osteoporosis without current pathological fracture: Secondary | ICD-10-CM | POA: Diagnosis not present

## 2021-12-01 DIAGNOSIS — E782 Mixed hyperlipidemia: Secondary | ICD-10-CM

## 2021-12-01 DIAGNOSIS — J9611 Chronic respiratory failure with hypoxia: Secondary | ICD-10-CM

## 2021-12-01 NOTE — Progress Notes (Signed)
Chronic Care Management Pharmacy Note  07/05/2021 Name:  [name] [name] MRN:  [id number] DOB:  Oct 02, 1945  Summary: CCM F/U visit -Pt endorses compliance with medications. She has successfully tapered off trazodone. She denies any new health concerns -LDL has increased from 63 > 80 (08/2021), now above goal of LDL < 70. She endorses continued compliance with statin and ezetimibe. Emphasized diet/exercise to improve cholesterol  Recommendations/Changes made from today's visit: - No med changes  Plan: -[facility] will call patient 6 months for general adherence review -Pharmacist follow up televisit scheduled for 1 year -PCP 54-month f/u June 2023    Subjective: [name] [name] is an 76 y.o. year old female who is a primary patient of Ria Bush, MD.  The CCM team was consulted for assistance with disease management and care coordination needs.    Engaged with patient by telephone for follow up visit in response to provider referral for pharmacy case management and/or care coordination services.   Consent to Services:  The patient was given information about Chronic Care Management services, agreed to services, and gave verbal consent prior to initiation of services.  Please see initial visit note for detailed documentation.   Patient Care Team: Ria Bush, MD as PCP - General (Family Medicine) Minna Merritts, MD as PCP - Cardiology (Cardiology) Charlton Haws, [facility] as Pharmacist (Pharmacist)  Recent office visits: 09/01/21-Dr Prudencio Pair: Patient presented for AWV.Discussed screenings, vaccines,update dexa scan, labs ordered. Start diflucan $RemoveBefore'150mg'INnqhwzDeagfB$  take 1 tablet once a week for 3 weeks. Taper off trazodone to 25 mg nightly. Referral for lung cancer screening program.  08/16/21-Dr G- Telemedicine-Patient presented for a sore throat. Patient to drive through for flu and covid-19 test. 07/26/21-PCP-Telemedicine-Patient presented for cough and runny  nose.start nystatin cream for rash. 05/28/21 Danise Mina - Trial off gabapentin (see how you do, ok to restart if needed). Try lower dose trazodone ($RemoveBeforeD'50mg'xGNPQfxpHXNEwG$  sent to pharmacy). Restart advair inhaler 2 puffs twice daily. This should help breathing. We will refer you to physical therapy. Update me in 2-3 weeks with how you're doing. We may add second mood medicine in addition to sertraline.   Recent consult visits: 09/28/21-Cardiology-Patient presented for follow up heart failure. EKG, no medication changes. 08/30/21-Urology-Patient presented for follow up UTI. Will start empiric cefuroxime and send for culture for further evaluation. Follow up 1 week. 07/28/21-Urology-Patient presented for recurrent UTI. UA ordered. Start bactrim DS 800-160 mg take 1 tablet 2 times daily for 5 days.   12/03/20 - Urology - Urge incontinence is most completely resolved.  We also have her on topical estrogen cream with her history of recurrent UTIs.  Continue Myrbetriq 50 mg daily with coupon. RTC 1 year.  11/05/20 - Urology - OAB - Started 6 months UTI prophylaxis August 2021 nitrofurantoin 50 mg daily. She stopped this last week unclear reason. Macrobid 100 mg twice daily x5 days for suspected UTI, call with UA and culture results. Start topical estrogen cream for history of recurrent UTIs. Trial of Myrbetriq 50 mg daily for OAB.   Hospital visits: None in previous 6 months  Objective:  Lab Results  Component Value Date   CREATININE 0.98 09/14/2021   BUN 23 09/14/2021   GFR 61.96 09/01/2021   GFRNONAA >60 09/14/2021   GFRAA >60 06/26/2020   NA 138 09/14/2021   K 4.0 09/14/2021   CALCIUM 9.1 09/14/2021   CO2 28 09/14/2021   GLUCOSE 107 (H) 09/14/2021    Lab Results  Component Value  Date/Time   HGBA1C 5.1 07/31/2018 02:17 AM   HGBA1C  09/30/2008 10:35 PM    5.6 (NOTE)   The ADA recommends the following therapeutic goal for glycemic   control related to Hgb A1C measurement:   Goal of Therapy:   < 7.0% Hgb A1C    Reference: American Diabetes Association: Clinical Practice   Recommendations 2008, Diabetes Care,  2008, 31:(Suppl 1).   GFR 61.96 09/01/2021 10:11 AM   GFR 49.72 (L) 08/05/2020 11:08 AM   MICROALBUR 2.3 (H) 08/05/2020 11:08 AM   MICROALBUR 1.8 01/25/2016 08:23 AM    Lab Results  Component Value Date   CHOL 165 09/01/2021   HDL 64.50 09/01/2021   LDLCALC 80 09/01/2021   LDLDIRECT 84.0 09/11/2017   TRIG 104.0 09/01/2021   CHOLHDL 3 09/01/2021    Hepatic Function Latest Ref Rng & Units 09/14/2021 09/01/2021 08/05/2020  Total Protein 6.5 - 8.1 g/dL 7.6 7.8 7.4  Albumin 3.5 - 5.0 g/dL 3.9 4.2 4.4  AST 15 - 41 U/L $Remo'21 19 25  'BUHQF$ ALT 0 - 44 U/L $Remo'14 13 20  'gLCMw$ Alk Phosphatase 38 - 126 U/L 82 80 89  Total Bilirubin 0.3 - 1.2 mg/dL 0.7 0.4 0.5  Bilirubin, Direct 0.0 - 0.2 mg/dL - - -    Lab Results  Component Value Date/Time   TSH 2.40 06/02/2021 03:13 PM   TSH 5.19 (H) 08/05/2020 11:08 AM   FREET4 0.88 06/02/2021 03:13 PM   FREET4 0.99 08/06/2020 10:29 AM    CBC Latest Ref Rng & Units 09/14/2021 09/01/2021 04/02/2021  WBC 4.0 - 10.5 K/uL 8.8 7.9 7.0  Hemoglobin 12.0 - 15.0 g/dL 13.3 13.7 13.3  Hematocrit 36.0 - 46.0 % 41.3 41.8 42.7  Platelets 150 - 400 K/uL 154 183.0 172    Lab Results  Component Value Date/Time   VD25OH 51.25 09/01/2021 10:11 AM   VD25OH 61.68 08/05/2020 11:08 AM    Clinical ASCVD: Yes  The 10-year ASCVD risk score (Arnett DK, et al., 2019) is: 13.7%   Values used to calculate the score:     Age: 76 years     Sex: Female     Is Non-Hispanic African American: No     Diabetic: No     Tobacco smoker: No     Systolic Blood Pressure: 468 mmHg     Is BP treated: Yes     HDL Cholesterol: 64.5 mg/dL     Total Cholesterol: 165 mg/dL    Depression screen [facility] 2/9 06/02/2021 08/12/2020 12/31/2019  Decreased Interest 2 0 0  Down, Depressed, Hopeless 3 0 0  PHQ - 2 Score 5 0 0  Altered sleeping 3 2 -  Tired, decreased energy 3 3 -  Change in appetite 0 0 -  Feeling bad  or failure about yourself  3 0 -  Trouble concentrating 0 1 -  Moving slowly or fidgety/restless 3 0 -  Suicidal thoughts 2 0 -  PHQ-9 Score 19 6 -  Difficult doing work/chores - - -  Some recent data might be hidden    Social History   Tobacco Use  Smoking Status Former   Packs/day: 2.00   Years: 46.00   Pack years: 92.00   Types: Cigarettes   Quit date: 11/08/2012   Years since quitting: 9.0  Smokeless Tobacco Never   BP Readings from Last 3 Encounters:  11/16/21 102/70  09/28/21 100/70  09/14/21 113/63   Pulse Readings from Last 3 Encounters:  11/16/21 85  09/28/21  91  09/14/21 71   Wt Readings from Last 3 Encounters:  11/16/21 185 lb (83.9 kg)  09/28/21 180 lb 6 oz (81.8 kg)  09/01/21 183 lb 5 oz (83.2 kg)   BMI Readings from Last 3 Encounters:  11/16/21 41.48 kg/m  09/28/21 40.44 kg/m  09/01/21 36.10 kg/m    Assessment/Interventions: Review of patient past medical history, allergies, medications, health status, including review of consultants reports, laboratory and other test data, was performed as part of comprehensive evaluation and provision of chronic care management services.   SDOH:  (Social Determinants of Health) assessments and interventions performed: No - last assessed Nov 2022  SDOH Screenings   Alcohol Screen: Low Risk    Last Alcohol Screening Score (AUDIT): 0  Depression (PHQ2-9): Medium Risk   PHQ-2 Score: 19  Financial Resource Strain: Low Risk    Difficulty of Paying Living Expenses: Not hard at all  Food Insecurity: No Food Insecurity   Worried About Charity fundraiser in the Last Year: Never true   Ran Out of Food in the Last Year: Never true  Housing: Low Risk    Last Housing Risk Score: 0  Physical Activity: Sufficiently Active   Days of Exercise per Week: 7 days   Minutes of Exercise per Session: 30 min  Social Connections: Socially Isolated   Frequency of Communication with Friends and Family: More than three times a week    Frequency of Social Gatherings with Friends and Family: Twice a week   Attends Religious Services: Never   Marine scientist or Organizations: No   Attends Music therapist: Never   Marital Status: Divorced  Stress: No Stress Concern Present   Feeling of Stress : Not at all  Tobacco Use: Medium Risk   Smoking Tobacco Use: Former   Smokeless Tobacco Use: Never   Passive Exposure: Not on Pensions consultant Needs: No Transportation Needs   Lack of Transportation (Medical): No   Lack of Transportation (Non-Medical): No       CCM Care Plan  Allergies  Allergen Reactions   Doxycycline Nausea Only and Other (See Comments)    GI upset    Oxycodone Other (See Comments)    Overly sedating, hypotension, decreased oxgyen    Medications Reviewed Today     Reviewed by Charlton Haws, [facility] (Pharmacist) on 12/01/21 at [place] List Status: <None>   Medication Order Taking? Sig Documenting Provider Last Dose Status Informant  acetaminophen (TYLENOL) 500 MG tablet [id number] Yes Take 1 tablet (500 mg total) by mouth every 6 (six) hours as needed for headache. Ria Bush, MD Taking Active Family Member  albuterol (VENTOLIN HFA) [phone] Base) MCG/ACT inhaler [id number] Yes INHALE 2 PUFFS INTO THE LUNGS EVERY 6 (SIX) HOURS AS NEEDED FOR WHEEZING OR SHORTNESS OF BREATH. Ria Bush, MD Taking Active Family Member  alendronate (FOSAMAX) 70 MG tablet [id number] Yes TAKE 1 TABLET EVERY WEEK WITH A FULL GLASS OF WATER ON AN EMPTY STOMACH. Ria Bush, MD Taking Active   aspirin (ASPIRIN 81) 81 MG EC tablet [id number] Yes Take 1 tablet (81 mg total) by mouth daily. Swallow whole. Ria Bush, MD Taking Active Family Member  atorvastatin (LIPITOR) 40 MG tablet [id number] Yes Take 1 tablet (40 mg total) by mouth daily. Ria Bush, MD Taking Active Family Member  Cholecalciferol (VITAMIN D) 50 MCG (2000 UT) tablet [id number] Yes Take 2,000 Units by  mouth daily. [provider] Taking Active Family Member  clopidogrel (  PLAVIX) 75 MG tablet [id number] Yes TAKE 1 TABLET (75 MG TOTAL) BY MOUTH DAILY. Ria Bush, MD Taking Active   Cranberry-Vitamin C (AZO CRANBERRY URINARY TRACT PO) [id number] Yes Take 1 tablet by mouth in the morning and at bedtime. [provider] Taking Active Family Member           Med Note Malena Catholic Dec 01, 2021  4:39 PM)    estradiol (ESTRACE) 0.1 MG/GM vaginal cream [id number] Yes Estrogen Cream Instruction Discard applicator Apply pea sized amount to tip of finger to urethra before bed. Wash hands well after application. Use Monday, Wednesday and Friday Billey Co, MD Taking Active   ezetimibe (ZETIA) 10 MG tablet [id number] Yes Take 1 tablet (10 mg total) by mouth daily. Minna Merritts, MD Taking Active   fluconazole (DIFLUCAN) 150 MG tablet [id number] Yes Take 1 tablet (150 mg total) by mouth once a week. Ria Bush, MD Taking Active   fluticasone-salmeterol (ADVAIR [facility]) 242-35 MCG/ACT inhaler [id number] Yes Inhale 2 puffs into the lungs 2 (two) times daily. Ria Bush, MD Taking Active Self  furosemide (LASIX) 20 MG tablet [id number] Yes Take 1 tablet (20 mg total) by mouth daily as needed for fluid. Weight every morning before given Minna Merritts, MD Taking Active Family Member  gabapentin (NEURONTIN) 100 MG capsule [id number] Yes TAKE 1 CAPSULE AT BEDTIME Ria Bush, MD Taking Active   levothyroxine (SYNTHROID) 50 MCG tablet [id number] Yes TAKE 1 TABLET EVERY DAY BEFORE Sander Radon, MD Taking Active   Melatonin 5 MG CHEW [id number] Yes Chew 10 mg by mouth daily. Ria Bush, MD Taking Active   mirabegron ER [facility]) 50 MG TB24 tablet [id number] Yes Take 1 tablet (50 mg total) by mouth daily. Billey Co, MD Taking Active Family Member  Multiple Vitamins-Minerals (MULTIVITAMIN PO) [id number] Yes Take 1 tablet by mouth daily.  Gummie [provider] Taking Active Family Member           Med Note Burnadette Peter, Beverely Low Sep 28, 2021  2:03 PM)    nystatin cream (MYCOSTATIN) [id number] Yes Apply 1 application topically 2 (two) times daily. Ria Bush, MD Taking Active   OXYGEN [id number] Yes Inhale 2 L/min into the lungs See admin instructions. Inhale 2 L/min of oxygen at bedtime and during the day as needed for shortness of breath [provider] Taking Active Family Member  Potassium Chloride ER 20 MEQ TBCR [id number] Yes Take 1 tablet by mouth 2 (two) times daily as needed (with lasix use). Ria Bush, MD Taking Active   sertraline (ZOLOFT) 100 MG tablet [id number] Yes TAKE 1 TABLET EVERY DAY Ria Bush, MD Taking Active   traMADol (ULTRAM) 50 MG tablet [id number] Yes Take 1 tablet (50 mg total) by mouth 2 (two) times daily as needed for moderate pain. Ria Bush, MD Taking Active   vitamin C (ASCORBIC ACID) 500 MG tablet [id number] Yes Take 500 mg by mouth daily. [provider] Taking Active Family Member            Patient Active Problem List   Diagnosis Date Noted   Contusion of coccyx 11/16/2021   Mixed incontinence 10/23/2021   Groin rash 07/27/2021   History of recurrent UTIs 02/18/2020   Acute midline back pain 12/21/2019   Neuropathy 05/06/2019   CKD (chronic kidney disease) stage 3, GFR 30-59 ml/min (HCC) 01/24/2019   Tinnitus of both ears 11/24/2018   Vision loss of left eye  07/31/2018   Thrombocytopenia ([place]) 07/31/2018   Tremor 07/03/2018   Debility 05/07/2018   Chronic diastolic heart failure ([place]) 01/30/2018   Memory loss 01/30/2018   Peripheral edema 01/30/2018   Intertrigo 01/30/2018   Chronic back pain 11/21/2017   Chronic respiratory failure with hypoxia ([place]) 11/14/2017   Shortness of breath 11/14/2017   Closed compression fracture of fourth lumbar vertebra ([place]) 10/02/2017   Compression fracture of body of thoracic vertebra ([place])  09/30/2017   Hypothyroidism (acquired) 06/28/2017   Vitamin D deficiency 06/25/2017   Concussion 08/03/2015   Advanced care planning/counseling discussion 01/29/2015   Encounter for general adult medical examination with abnormal findings 01/29/2015   Carotid stenosis 04/03/2014   Carotid artery aneurysm ([place]) 04/03/2014   Recurrent falls 01/02/2014   Hemiparesis affecting right side as late effect of stroke ([place]) 01/02/2014   Insomnia 01/10/2013   Medicare annual wellness visit, subsequent 11/26/2012   Hypertension    History of lacunar cerebrovascular accident (CVA)    Peripheral vascular disease ([place])    COPD (chronic obstructive pulmonary disease) ([place])    Hyperlipidemia    Osteoarthritis    MDD (major depressive disorder), recurrent episode, moderate ([place])    Ex-smoker    Osteoporosis 11/25/2010    Immunization History  Administered Date(s) Administered   Fluad Quad(high Dose 65+) 07/08/2019, 08/12/2020, 06/02/2021   Influenza,inj,Quad PF,6+ Mos 07/04/2013, 08/03/2015, 06/26/2017, 06/27/2018   PFIZER(Purple Top)SARS-COV-2 Vaccination 11/22/2019, 12/17/2019   Pneumococcal Conjugate-13 01/02/2014   Pneumococcal Polysaccharide-23 11/26/2012   Td 09/26/2004   Tdap 08/06/2015    Conditions to be addressed/monitored:  Hypertension, Hyperlipidemia, Heart Failure, Coronary Artery Disease, COPD, and Osteoporosis  Care Plan : [place]  Updates made by Charlton Haws, [place] since 12/01/2021 12:00 AM     Problem: Hypertension, Hyperlipidemia, Heart Failure, Coronary Artery Disease, COPD, and Osteoporosis   Priority: High     Long-Range Goal: Disease Management   Start Date: 12/15/2020  This Visit's Progress: On track  Recent Progress: On track  Priority: High  Note:   Current Barriers:  Unable to independently monitor therapeutic efficacy  Pharmacist Clinical Goal(s):  Patient will achieve adherence to monitoring guidelines and medication adherence to  achieve therapeutic efficacy through collaboration with PharmD and provider.   Interventions: 1:1 collaboration with Ria Bush, MD regarding development and update of comprehensive plan of care as evidenced by provider attestation and co-signature Inter-disciplinary care team collaboration (see longitudinal plan of care) Comprehensive medication review performed; medication list updated in electronic medical record  Hypertension / Heart Failure (BP goal <130/80) -Controlled - BP is at goal; pt takes lasix about once a week -Last ejection fraction: 60-65% (Date: 11/2019) -HF type: Diastolicl NYHA Class: III (marked limitation of activity) -Current treatment: Furosemide 20 mg daily PRN - Appropriate, Effective, Safe, Accessible -Medications previously tried: benazepril  -Educated on BP goals and benefits of medications for prevention of heart attack, stroke and kidney damage; -Counseled to monitor BP at home periodically -Recommended to continue current medication  Hyperlipidemia: (LDL goal < 70) -Not ideally controlled - LDL worsened 63 > 80 (08/2021); Pt confirms adherence, no side effects reported -Hx CVA, PVD, carotid artery aneurysm -Current treatment: Atorvastatin 40 mg daily HS - Appropriate, Query Effective Ezetimibe 10 mg daily HS -Appropriate, Query Effective Clopidogrel 75 mg daily - Appropriate, Effective, Safe, Accessible -Medications previously tried: none -Educated on Cholesterol goals; benefits of statin for ASCVD risk reduction -Recommended to continue current medication  COPD (Goal: control symptoms and prevent exacerbations) -  Controlled - per pt report; she is not using albuterol very much; she endorses compliance with Advair BID (previously was using once daily) -Gold Grade: Gold 2 (FEV1 50-79%) -Pulmonary function testing: 12/2012 - FEV1 69% predicted, FEV1/FVC 0.64. Moderate airway obstruction. -Exacerbations requiring treatment in last 6 months: 0 -Current  treatment  Oxygen Advair HFA 115-21 mcg 2 puff BID -Appropriate, Effective, Safe, Accessible Albuterol HFA prn -Appropriate, Effective, Safe, Accessible -Medications previously tried: n/a  -Patient reports consistent use of maintenance inhaler -Frequency of rescue inhaler use: infrequent -Counseled on Differences between maintenance and rescue inhalers -Recommended to continue current medication  Osteoporosis (Goal prevent fractures) -Controlled - Pt had PT for balance and increased activity after moving in with her daughter -Last DEXA Scan: 09/22/21 - decrease in R hip from previous  T-Score femoral neck: -3.8  T-Score total hip: -3.4  T-Score lumbar spine: n/a  T-Score forearm radius: -4.8 -Patient is a candidate for pharmacologic treatment due to T-Score < -2.5 in femoral neck and T-Score < -2.5 in total hip  -Current treatment  Fosamax 70 mg - 1 tablet weekly -Appropriate, Effective, Safe, Accessible Vitamin D3 2000 IU - 1 tablet daily  -Appropriate, Effective, Safe, Accessible -Medications previously tried:  Prolia cost prohibitive -Recommended to continue current medication  Patient Goals/Self-Care Activities Patient will:  - take medications as prescribed as evidenced by patient report and record review focus on medication adherence by routine     Compliance/Adherence/Medication fill history: Care Gaps: None  Star-Rating Drugs: Atorvastatin - PDC 100%  Medication Assistance: None required.  Patient affirms current coverage meets needs.  Patient's preferred pharmacy is:  [facility] [address] ([address]), [place] [address] [place] ([place]) [place], [place] [address] Phone: [phone] Fax: [phone]  [place], [place] [address] Phone: [phone] Fax: [phone]   Uses pill box? Yes Pt endorses 100% compliance - daughter, [name], fills pillbox  Uses  [facility] primarily, [facility] for acute meds. All meds are in morning except trazodone and Zetia at night.  We discussed: Current pharmacy is preferred with insurance plan and patient is satisfied with pharmacy services Patient decided to: Continue current medication management strategy  Care Plan and Follow Up Patient Decision:  Patient agrees to Care Plan and Follow-up.  Follow Up Plan: Telephone follow up appointment with care management team member scheduled for: 1 year  Charlene Brooke, PharmD, [facility] Clinical Pharmacist [facility] [phone]

## 2021-12-01 NOTE — Patient Instructions (Signed)
Visit Information ? ?Phone number for Pharmacist: [phone] ? ? Goals Addressed   ?None ?  ? ? ?Care Plan : Velma  ?Updates made by Charlton Haws, [place] since 12/01/2021 12:00 AM  ?  ? ?Problem: Hypertension, Hyperlipidemia, Heart Failure, Coronary Artery Disease, COPD, and Osteoporosis   ?Priority: High  ?  ? ?Long-Range Goal: Disease Management   ?Start Date: 12/15/2020  ?This Visit's Progress: On track  ?Recent Progress: On track  ?Priority: High  ?Note:   ?Current Barriers:  ?Unable to independently monitor therapeutic efficacy ? ?Pharmacist Clinical Goal(s):  ?Patient will achieve adherence to monitoring guidelines and medication adherence to achieve therapeutic efficacy through collaboration with PharmD and provider.  ? ?Interventions: ?1:1 collaboration with Ria Bush, MD regarding development and update of comprehensive plan of care as evidenced by provider attestation and co-signature ?Inter-disciplinary care team collaboration (see longitudinal plan of care) ?Comprehensive medication review performed; medication list updated in electronic medical record ? ?Hypertension / Heart Failure (BP goal <130/80) ?-Controlled - BP is at goal; pt takes lasix about once a week ?-Last ejection fraction: 60-65% (Date: 11/2019) ?-HF type: Diastolicl NYHA Class: III (marked limitation of activity) ?-Current treatment: ?Furosemide 20 mg daily PRN - Appropriate, Effective, Safe, Accessible ?-Medications previously tried: benazepril  ?-Educated on BP goals and benefits of medications for prevention of heart attack, stroke and kidney damage; ?-Counseled to monitor BP at home periodically ?-Recommended to continue current medication ? ?Hyperlipidemia: (LDL goal < 70) ?-Not ideally controlled - LDL worsened 63 > 80 (08/2021); Pt confirms adherence, no side effects reported ?-Hx CVA, PVD, carotid artery aneurysm ?-Current treatment: ?Atorvastatin 40 mg daily HS - Appropriate, Query  Effective ?Ezetimibe 10 mg daily HS -Appropriate, Query Effective ?Clopidogrel 75 mg daily - Appropriate, Effective, Safe, Accessible ?-Medications previously tried: none ?-Educated on Cholesterol goals; benefits of statin for ASCVD risk reduction ?-Recommended to continue current medication ? ?COPD (Goal: control symptoms and prevent exacerbations) ?-Controlled - per pt report; she is not using albuterol very much; she endorses compliance with Advair BID (previously was using once daily) ?-Gold Grade: Gold 2 (FEV1 50-79%) ?-Pulmonary function testing: 12/2012 - FEV1 69% predicted, FEV1/FVC 0.64. Moderate airway obstruction. ?-Exacerbations requiring treatment in last 6 months: 0 ?-Current treatment  ?Oxygen ?Advair HFA 115-21 mcg 2 puff BID -Appropriate, Effective, Safe, Accessible ?Albuterol HFA prn -Appropriate, Effective, Safe, Accessible ?-Medications previously tried: n/a  ?-Patient reports consistent use of maintenance inhaler ?-Frequency of rescue inhaler use: infrequent ?-Counseled on Differences between maintenance and rescue inhalers ?-Recommended to continue current medication ? ?Osteoporosis (Goal prevent fractures) ?-Controlled - Pt had PT for balance and increased activity after moving in with her daughter ?-Last DEXA Scan: 09/22/21 - decrease in R hip from previous ? T-Score femoral neck: -3.8 ? T-Score total hip: -3.4 ? T-Score lumbar spine: n/a ? T-Score forearm radius: -4.8 ?-Patient is a candidate for pharmacologic treatment due to T-Score < -2.5 in femoral neck and T-Score < -2.5 in total hip  ?-Current treatment  ?Fosamax 70 mg - 1 tablet weekly -Appropriate, Effective, Safe, Accessible ?Vitamin D3 2000 IU - 1 tablet daily  -Appropriate, Effective, Safe, Accessible ?-Medications previously tried:  Prolia cost prohibitive ?-Recommended to continue current medication ? ?Patient Goals/Self-Care Activities ?Patient will:  ?- take medications as prescribed as evidenced by patient report and record  review ?focus on medication adherence by routine ? ?  ?  ? ?Patient verbalizes understanding of instructions and care plan provided today and agrees to view  in [place]. Active MyChart status confirmed with patient.   ?Telephone follow up appointment with pharmacy team member scheduled for:1 year ? ?Charlene Brooke, PharmD, BCACP ?Clinical Pharmacist ?[facility] ?[phone] ?  ?

## 2021-12-03 ENCOUNTER — Other Ambulatory Visit: Payer: Self-pay | Admitting: Family Medicine

## 2021-12-08 ENCOUNTER — Ambulatory Visit: Payer: Medicare HMO | Admitting: Urology

## 2021-12-09 ENCOUNTER — Emergency Department (HOSPITAL_COMMUNITY): Payer: Medicare HMO

## 2021-12-09 ENCOUNTER — Ambulatory Visit: Payer: Self-pay | Admitting: Urology

## 2021-12-09 ENCOUNTER — Encounter (HOSPITAL_COMMUNITY): Payer: Self-pay

## 2021-12-09 ENCOUNTER — Other Ambulatory Visit: Payer: Self-pay

## 2021-12-09 ENCOUNTER — Inpatient Hospital Stay (HOSPITAL_COMMUNITY)
Admission: EM | Admit: 2021-12-09 | Discharge: 2021-12-25 | DRG: 480 | Disposition: E | Payer: Medicare HMO | Attending: Internal Medicine | Admitting: Internal Medicine

## 2021-12-09 DIAGNOSIS — F331 Major depressive disorder, recurrent, moderate: Secondary | ICD-10-CM | POA: Diagnosis not present

## 2021-12-09 DIAGNOSIS — E039 Hypothyroidism, unspecified: Secondary | ICD-10-CM | POA: Diagnosis present

## 2021-12-09 DIAGNOSIS — I5032 Chronic diastolic (congestive) heart failure: Secondary | ICD-10-CM | POA: Diagnosis not present

## 2021-12-09 DIAGNOSIS — L7632 Postprocedural hematoma of skin and subcutaneous tissue following other procedure: Secondary | ICD-10-CM | POA: Diagnosis not present

## 2021-12-09 DIAGNOSIS — Z043 Encounter for examination and observation following other accident: Secondary | ICD-10-CM | POA: Diagnosis not present

## 2021-12-09 DIAGNOSIS — R109 Unspecified abdominal pain: Secondary | ICD-10-CM | POA: Diagnosis not present

## 2021-12-09 DIAGNOSIS — J439 Emphysema, unspecified: Secondary | ICD-10-CM

## 2021-12-09 DIAGNOSIS — I97631 Postprocedural hematoma of a circulatory system organ or structure following cardiac bypass: Secondary | ICD-10-CM | POA: Diagnosis not present

## 2021-12-09 DIAGNOSIS — N1831 Chronic kidney disease, stage 3a: Secondary | ICD-10-CM | POA: Diagnosis not present

## 2021-12-09 DIAGNOSIS — S8991XA Unspecified injury of right lower leg, initial encounter: Secondary | ICD-10-CM | POA: Diagnosis not present

## 2021-12-09 DIAGNOSIS — E8809 Other disorders of plasma-protein metabolism, not elsewhere classified: Secondary | ICD-10-CM | POA: Diagnosis present

## 2021-12-09 DIAGNOSIS — R001 Bradycardia, unspecified: Secondary | ICD-10-CM | POA: Diagnosis not present

## 2021-12-09 DIAGNOSIS — I517 Cardiomegaly: Secondary | ICD-10-CM | POA: Diagnosis not present

## 2021-12-09 DIAGNOSIS — R918 Other nonspecific abnormal finding of lung field: Secondary | ICD-10-CM | POA: Diagnosis not present

## 2021-12-09 DIAGNOSIS — S72141D Displaced intertrochanteric fracture of right femur, subsequent encounter for closed fracture with routine healing: Secondary | ICD-10-CM | POA: Diagnosis not present

## 2021-12-09 DIAGNOSIS — I700 Atherosclerosis of aorta: Secondary | ICD-10-CM | POA: Diagnosis not present

## 2021-12-09 DIAGNOSIS — Z7983 Long term (current) use of bisphosphonates: Secondary | ICD-10-CM

## 2021-12-09 DIAGNOSIS — I25110 Atherosclerotic heart disease of native coronary artery with unstable angina pectoris: Secondary | ICD-10-CM | POA: Diagnosis not present

## 2021-12-09 DIAGNOSIS — I2584 Coronary atherosclerosis due to calcified coronary lesion: Secondary | ICD-10-CM | POA: Diagnosis present

## 2021-12-09 DIAGNOSIS — Z79899 Other long term (current) drug therapy: Secondary | ICD-10-CM

## 2021-12-09 DIAGNOSIS — W010XXA Fall on same level from slipping, tripping and stumbling without subsequent striking against object, initial encounter: Secondary | ICD-10-CM | POA: Diagnosis present

## 2021-12-09 DIAGNOSIS — K5900 Constipation, unspecified: Secondary | ICD-10-CM | POA: Diagnosis not present

## 2021-12-09 DIAGNOSIS — I6523 Occlusion and stenosis of bilateral carotid arteries: Secondary | ICD-10-CM | POA: Diagnosis present

## 2021-12-09 DIAGNOSIS — I739 Peripheral vascular disease, unspecified: Secondary | ICD-10-CM | POA: Diagnosis not present

## 2021-12-09 DIAGNOSIS — I97121 Postprocedural cardiac arrest following other surgery: Secondary | ICD-10-CM | POA: Diagnosis not present

## 2021-12-09 DIAGNOSIS — D62 Acute posthemorrhagic anemia: Secondary | ICD-10-CM | POA: Diagnosis not present

## 2021-12-09 DIAGNOSIS — R1310 Dysphagia, unspecified: Secondary | ICD-10-CM | POA: Diagnosis not present

## 2021-12-09 DIAGNOSIS — Z833 Family history of diabetes mellitus: Secondary | ICD-10-CM

## 2021-12-09 DIAGNOSIS — I110 Hypertensive heart disease with heart failure: Secondary | ICD-10-CM | POA: Diagnosis not present

## 2021-12-09 DIAGNOSIS — L304 Erythema intertrigo: Secondary | ICD-10-CM | POA: Diagnosis not present

## 2021-12-09 DIAGNOSIS — Z6841 Body Mass Index (BMI) 40.0 and over, adult: Secondary | ICD-10-CM | POA: Diagnosis not present

## 2021-12-09 DIAGNOSIS — S42021A Displaced fracture of shaft of right clavicle, initial encounter for closed fracture: Secondary | ICD-10-CM

## 2021-12-09 DIAGNOSIS — D631 Anemia in chronic kidney disease: Secondary | ICD-10-CM | POA: Diagnosis present

## 2021-12-09 DIAGNOSIS — M7989 Other specified soft tissue disorders: Secondary | ICD-10-CM | POA: Diagnosis not present

## 2021-12-09 DIAGNOSIS — Z881 Allergy status to other antibiotic agents status: Secondary | ICD-10-CM

## 2021-12-09 DIAGNOSIS — Y93K1 Activity, walking an animal: Secondary | ICD-10-CM | POA: Diagnosis not present

## 2021-12-09 DIAGNOSIS — Z66 Do not resuscitate: Secondary | ICD-10-CM | POA: Diagnosis not present

## 2021-12-09 DIAGNOSIS — Y838 Other surgical procedures as the cause of abnormal reaction of the patient, or of later complication, without mention of misadventure at the time of the procedure: Secondary | ICD-10-CM | POA: Diagnosis not present

## 2021-12-09 DIAGNOSIS — W19XXXA Unspecified fall, initial encounter: Secondary | ICD-10-CM | POA: Diagnosis not present

## 2021-12-09 DIAGNOSIS — S0990XA Unspecified injury of head, initial encounter: Secondary | ICD-10-CM | POA: Diagnosis not present

## 2021-12-09 DIAGNOSIS — M810 Age-related osteoporosis without current pathological fracture: Secondary | ICD-10-CM | POA: Diagnosis present

## 2021-12-09 DIAGNOSIS — Z7902 Long term (current) use of antithrombotics/antiplatelets: Secondary | ICD-10-CM

## 2021-12-09 DIAGNOSIS — I10 Essential (primary) hypertension: Secondary | ICD-10-CM | POA: Diagnosis present

## 2021-12-09 DIAGNOSIS — G629 Polyneuropathy, unspecified: Secondary | ICD-10-CM

## 2021-12-09 DIAGNOSIS — I69351 Hemiplegia and hemiparesis following cerebral infarction affecting right dominant side: Secondary | ICD-10-CM | POA: Diagnosis not present

## 2021-12-09 DIAGNOSIS — Z7189 Other specified counseling: Secondary | ICD-10-CM | POA: Diagnosis not present

## 2021-12-09 DIAGNOSIS — I130 Hypertensive heart and chronic kidney disease with heart failure and stage 1 through stage 4 chronic kidney disease, or unspecified chronic kidney disease: Secondary | ICD-10-CM | POA: Diagnosis not present

## 2021-12-09 DIAGNOSIS — Z87891 Personal history of nicotine dependence: Secondary | ICD-10-CM

## 2021-12-09 DIAGNOSIS — D696 Thrombocytopenia, unspecified: Secondary | ICD-10-CM | POA: Diagnosis not present

## 2021-12-09 DIAGNOSIS — M79671 Pain in right foot: Secondary | ICD-10-CM | POA: Diagnosis not present

## 2021-12-09 DIAGNOSIS — R0902 Hypoxemia: Secondary | ICD-10-CM | POA: Diagnosis not present

## 2021-12-09 DIAGNOSIS — I2510 Atherosclerotic heart disease of native coronary artery without angina pectoris: Secondary | ICD-10-CM | POA: Diagnosis present

## 2021-12-09 DIAGNOSIS — R531 Weakness: Secondary | ICD-10-CM | POA: Diagnosis not present

## 2021-12-09 DIAGNOSIS — E785 Hyperlipidemia, unspecified: Secondary | ICD-10-CM | POA: Diagnosis present

## 2021-12-09 DIAGNOSIS — J449 Chronic obstructive pulmonary disease, unspecified: Secondary | ICD-10-CM | POA: Diagnosis not present

## 2021-12-09 DIAGNOSIS — G4700 Insomnia, unspecified: Secondary | ICD-10-CM | POA: Diagnosis not present

## 2021-12-09 DIAGNOSIS — I509 Heart failure, unspecified: Secondary | ICD-10-CM | POA: Diagnosis not present

## 2021-12-09 DIAGNOSIS — D638 Anemia in other chronic diseases classified elsewhere: Secondary | ICD-10-CM | POA: Diagnosis not present

## 2021-12-09 DIAGNOSIS — I959 Hypotension, unspecified: Secondary | ICD-10-CM | POA: Diagnosis not present

## 2021-12-09 DIAGNOSIS — S72141A Displaced intertrochanteric fracture of right femur, initial encounter for closed fracture: Secondary | ICD-10-CM | POA: Diagnosis not present

## 2021-12-09 DIAGNOSIS — Y92009 Unspecified place in unspecified non-institutional (private) residence as the place of occurrence of the external cause: Secondary | ICD-10-CM

## 2021-12-09 DIAGNOSIS — I214 Non-ST elevation (NSTEMI) myocardial infarction: Secondary | ICD-10-CM | POA: Diagnosis not present

## 2021-12-09 DIAGNOSIS — Z9842 Cataract extraction status, left eye: Secondary | ICD-10-CM

## 2021-12-09 DIAGNOSIS — Z885 Allergy status to narcotic agent status: Secondary | ICD-10-CM

## 2021-12-09 DIAGNOSIS — J9811 Atelectasis: Secondary | ICD-10-CM | POA: Diagnosis not present

## 2021-12-09 DIAGNOSIS — Z9049 Acquired absence of other specified parts of digestive tract: Secondary | ICD-10-CM

## 2021-12-09 DIAGNOSIS — Z808 Family history of malignant neoplasm of other organs or systems: Secondary | ICD-10-CM

## 2021-12-09 DIAGNOSIS — J432 Centrilobular emphysema: Secondary | ICD-10-CM | POA: Diagnosis not present

## 2021-12-09 DIAGNOSIS — Z9841 Cataract extraction status, right eye: Secondary | ICD-10-CM

## 2021-12-09 DIAGNOSIS — S42011A Anterior displaced fracture of sternal end of right clavicle, initial encounter for closed fracture: Secondary | ICD-10-CM | POA: Diagnosis present

## 2021-12-09 DIAGNOSIS — N281 Cyst of kidney, acquired: Secondary | ICD-10-CM | POA: Diagnosis not present

## 2021-12-09 DIAGNOSIS — M25571 Pain in right ankle and joints of right foot: Secondary | ICD-10-CM | POA: Diagnosis not present

## 2021-12-09 DIAGNOSIS — S42001A Fracture of unspecified part of right clavicle, initial encounter for closed fracture: Secondary | ICD-10-CM

## 2021-12-09 DIAGNOSIS — S99921A Unspecified injury of right foot, initial encounter: Secondary | ICD-10-CM | POA: Diagnosis not present

## 2021-12-09 DIAGNOSIS — I493 Ventricular premature depolarization: Secondary | ICD-10-CM | POA: Diagnosis not present

## 2021-12-09 DIAGNOSIS — I2589 Other forms of chronic ischemic heart disease: Secondary | ICD-10-CM | POA: Diagnosis not present

## 2021-12-09 DIAGNOSIS — Z515 Encounter for palliative care: Secondary | ICD-10-CM | POA: Diagnosis not present

## 2021-12-09 DIAGNOSIS — G8911 Acute pain due to trauma: Secondary | ICD-10-CM | POA: Diagnosis not present

## 2021-12-09 DIAGNOSIS — Z8249 Family history of ischemic heart disease and other diseases of the circulatory system: Secondary | ICD-10-CM

## 2021-12-09 DIAGNOSIS — R0600 Dyspnea, unspecified: Secondary | ICD-10-CM | POA: Diagnosis not present

## 2021-12-09 DIAGNOSIS — F419 Anxiety disorder, unspecified: Secondary | ICD-10-CM | POA: Diagnosis present

## 2021-12-09 DIAGNOSIS — M79661 Pain in right lower leg: Secondary | ICD-10-CM | POA: Diagnosis not present

## 2021-12-09 DIAGNOSIS — G319 Degenerative disease of nervous system, unspecified: Secondary | ICD-10-CM | POA: Diagnosis not present

## 2021-12-09 LAB — CBC WITH DIFFERENTIAL/PLATELET
Abs Immature Granulocytes: 0.12 10*3/uL — ABNORMAL HIGH (ref 0.00–0.07)
Basophils Absolute: 0.1 10*3/uL (ref 0.0–0.1)
Basophils Relative: 1 %
Eosinophils Absolute: 0.3 10*3/uL (ref 0.0–0.5)
Eosinophils Relative: 2 %
HCT: 43.2 % (ref 36.0–46.0)
Hemoglobin: 12.4 g/dL (ref 12.0–15.0)
Immature Granulocytes: 1 %
Lymphocytes Relative: 24 %
Lymphs Abs: 2.6 10*3/uL (ref 0.7–4.0)
MCH: 30.3 pg (ref 26.0–34.0)
MCHC: 28.7 g/dL — ABNORMAL LOW (ref 30.0–36.0)
MCV: 105.6 fL — ABNORMAL HIGH (ref 80.0–100.0)
Monocytes Absolute: 0.9 10*3/uL (ref 0.1–1.0)
Monocytes Relative: 8 %
Neutro Abs: 7 10*3/uL (ref 1.7–7.7)
Neutrophils Relative %: 64 %
Platelets: 186 10*3/uL (ref 150–400)
RBC: 4.09 MIL/uL (ref 3.87–5.11)
RDW: 13.1 % (ref 11.5–15.5)
WBC: 10.9 10*3/uL — ABNORMAL HIGH (ref 4.0–10.5)
nRBC: 0 % (ref 0.0–0.2)

## 2021-12-09 LAB — COMPREHENSIVE METABOLIC PANEL
ALT: 13 U/L (ref 0–44)
AST: 30 U/L (ref 15–41)
Albumin: 3.9 g/dL (ref 3.5–5.0)
Alkaline Phosphatase: 120 U/L (ref 38–126)
Anion gap: 11 (ref 5–15)
BUN: 25 mg/dL — ABNORMAL HIGH (ref 8–23)
CO2: 24 mmol/L (ref 22–32)
Calcium: 8.7 mg/dL — ABNORMAL LOW (ref 8.9–10.3)
Chloride: 103 mmol/L (ref 98–111)
Creatinine, Ser: 1.03 mg/dL — ABNORMAL HIGH (ref 0.44–1.00)
GFR, Estimated: 57 mL/min — ABNORMAL LOW (ref >60)
Glucose, Bld: 123 mg/dL — ABNORMAL HIGH (ref 70–99)
Potassium: 4.4 mmol/L (ref 3.5–5.1)
Sodium: 138 mmol/L (ref 135–145)
Total Bilirubin: 0.8 mg/dL (ref 0.3–1.2)
Total Protein: 7.2 g/dL (ref 6.5–8.1)

## 2021-12-09 MED ORDER — SERTRALINE HCL 100 MG PO TABS
100.0000 mg | ORAL_TABLET | Freq: Every day | ORAL | Status: DC
  Administered 2021-12-11 – 2021-12-20 (×10): 100 mg via ORAL
  Filled 2021-12-09 (×10): qty 1

## 2021-12-09 MED ORDER — MIRABEGRON ER 50 MG PO TB24
50.0000 mg | ORAL_TABLET | Freq: Every day | ORAL | Status: DC
  Administered 2021-12-11 – 2021-12-20 (×10): 50 mg via ORAL
  Filled 2021-12-09 (×11): qty 1

## 2021-12-09 MED ORDER — FENTANYL CITRATE PF 50 MCG/ML IJ SOSY
50.0000 ug | PREFILLED_SYRINGE | Freq: Once | INTRAMUSCULAR | Status: AC
  Administered 2021-12-09: 50 ug via INTRAVENOUS
  Filled 2021-12-09: qty 1

## 2021-12-09 MED ORDER — NYSTATIN 100000 UNIT/GM EX POWD
Freq: Two times a day (BID) | CUTANEOUS | Status: DC
  Filled 2021-12-09: qty 15

## 2021-12-09 MED ORDER — ACETAMINOPHEN 325 MG PO TABS: 650.0000 mg | ORAL_TABLET | Freq: Four times a day (QID) | ORAL | Status: DC | PRN

## 2021-12-09 MED ORDER — ACETAMINOPHEN 500 MG PO TABS: 1000.0000 mg | ORAL_TABLET | Freq: Four times a day (QID) | ORAL | Status: DC

## 2021-12-09 MED ORDER — HYDROCODONE-ACETAMINOPHEN 5-325 MG PO TABS
2.0000 | ORAL_TABLET | Freq: Four times a day (QID) | ORAL | Status: DC | PRN
  Administered 2021-12-09 – 2021-12-10 (×2): 2 via ORAL
  Filled 2021-12-09 (×2): qty 2

## 2021-12-09 MED ORDER — MELATONIN 5 MG PO TABS
10.0000 mg | ORAL_TABLET | Freq: Every evening | ORAL | Status: DC | PRN
  Administered 2021-12-11 – 2021-12-18 (×6): 10 mg via ORAL
  Filled 2021-12-09 (×6): qty 2

## 2021-12-09 MED ORDER — ONDANSETRON HCL 4 MG/2ML IJ SOLN: 4.0000 mg | Freq: Four times a day (QID) | INTRAMUSCULAR | Status: DC | PRN

## 2021-12-09 MED ORDER — CHLORHEXIDINE GLUCONATE 4 % EX LIQD
60.0000 mL | Freq: Once | CUTANEOUS | Status: AC
  Administered 2021-12-10: 4 via TOPICAL
  Filled 2021-12-09: qty 60

## 2021-12-09 MED ORDER — LEVOTHYROXINE SODIUM 50 MCG PO TABS
50.0000 ug | ORAL_TABLET | Freq: Every day | ORAL | Status: DC
  Administered 2021-12-10 – 2021-12-20 (×11): 50 ug via ORAL
  Filled 2021-12-09 (×11): qty 1

## 2021-12-09 MED ORDER — MOMETASONE FURO-FORMOTEROL FUM 200-5 MCG/ACT IN AERO
2.0000 | INHALATION_SPRAY | Freq: Two times a day (BID) | RESPIRATORY_TRACT | Status: DC
  Administered 2021-12-13 – 2021-12-20 (×14): 2 via RESPIRATORY_TRACT
  Filled 2021-12-09 (×4): qty 8.8

## 2021-12-09 MED ORDER — ATORVASTATIN CALCIUM 40 MG PO TABS
40.0000 mg | ORAL_TABLET | Freq: Every day | ORAL | Status: DC
  Administered 2021-12-11 – 2021-12-20 (×10): 40 mg via ORAL
  Filled 2021-12-09 (×10): qty 1

## 2021-12-09 MED ORDER — TRAMADOL HCL 50 MG PO TABS
50.0000 mg | ORAL_TABLET | Freq: Four times a day (QID) | ORAL | Status: DC | PRN
  Administered 2021-12-10 – 2021-12-11 (×3): 50 mg via ORAL
  Filled 2021-12-09 (×3): qty 1

## 2021-12-09 MED ORDER — GABAPENTIN 100 MG PO CAPS: 100.0000 mg | ORAL_CAPSULE | Freq: Every day | ORAL | Status: DC

## 2021-12-09 MED ORDER — EZETIMIBE 10 MG PO TABS
10.0000 mg | ORAL_TABLET | Freq: Every evening | ORAL | Status: DC
  Administered 2021-12-09 – 2021-12-19 (×10): 10 mg via ORAL
  Filled 2021-12-09 (×10): qty 1

## 2021-12-09 NOTE — ED Notes (Signed)
ED TO INPATIENT HANDOFF REPORT  ED Nurse Name and Phone #: Delorise Shiner [phone]  S Name/Age/Gender [name] [name] 76 y.o. female Room/Bed: 015C/015C  Code Status   Code Status: Full Code  Home/SNF/Other Home Patient oriented to: self, place, time, and situation Is this baseline? Yes   Triage Complete: Triage complete  Chief Complaint Closed intertrochanteric fracture of hip, right, initial encounter [facility]) [S72.141A]  Triage Note Pt BIB [facility] from home d/t mechanical fall caused by stumbling back d/t her dogs. Fell backwards & hit the back of her head against a bookshelf does have hematoma there. Rotation to Right leg is noted (per EMS). A/Ox4, is on plavix, uses walker at baseline. VSS.   Allergies Allergies  Allergen Reactions   Doxycycline Nausea Only and Other (See Comments)    GI upset    Oxycodone Other (See Comments)    Overly sedating, hypotension, decreased oxgyen    Level of Care/Admitting Diagnosis ED Disposition     ED Disposition  Admit   Condition  --   Comment  Hospital Area: [facility] [[id number]]  Level of Care: Med-Surg [[id number]]  May admit patient to [facility] or [facility] if equivalent level of care is available:: No  Covid Evaluation: Asymptomatic - no recent exposure (last 10 days) testing not required  Diagnosis: Closed intertrochanteric fracture of hip, right, initial encounter [facility]) [[id number]]  Admitting Physician: Imogene Burn ERIC [[id number]]  Attending Physician: Imogene Burn, ERIC [[id number]]  Estimated length of stay: 3 - 4 days  Certification:: I certify this patient will need inpatient services for at least 2 midnights          B Medical/Surgery History Past Medical History:  Diagnosis Date   (HFpEF) heart failure with preserved ejection fraction (HCC)    a. 11/2019 Echo: EF 60-65%, no rwma. Mild LVH. Mild AI, mild to mod AS.   Aneurysm [facility])    She had 2.6 cm dilation of the infrarenal abdominal aorta on 08/01/15 CTA with 5 year [place]  recommended   Anxiety    Anxiety and depression    Aortic stenosis    mild-moderate AS 12/23/19 echo   Carotid stenosis    a. 06/2020 Carotid/Cerebral angio: LICA 70, L Vert 50, RCCA 50-60d, RICA 60-70p, 3.3x2.3 RICA aneurysm, RMCA M1 50-70.   CHF (congestive heart failure) (HCC)    Compression fracture of body of thoracic vertebra (HCC)    T 10   Concussion 08/03/2015   COPD (chronic obstructive pulmonary disease) (HCC) 12/2012   spirometry: Pre: FVC 84%, FEV1 69%, ratio 0.64 consistent with moderate obstruction.   Depression    Fall 08/03/2015   d/c home health 08/2015   Fracture of cervical vertebra, C5 (HCC) 08/06/2015   History of chicken pox    History of stress test    a. 01/2019 MV: No isch/infarct. EF 71%.   Hyperlipidemia    Hypertension    Hypothyroidism    Lower back pain    h/o HNP s/p surgery   Neuropathy    B/L feet   On supplemental oxygen by nasal cannula    at HS and PRN during the day   Osteoarthritis    h/o ruptured disc s/p ESI   Osteoporosis 11/2010   DEXA -2.7 spine, thoracic compression fracture   Peripheral vascular disease (HCC)    Smoker    quit 10/2012   Stroke (HCC) 2010   x3 with residual R hemiparesis, s/p R MCA balloon angioplasty (2010)   Wears dentures  upper   Past Surgical History:  Procedure Laterality Date   APPENDECTOMY  1960   CATARACT EXTRACTION     bilateral   CESAREAN SECTION     CHOLECYSTECTOMY  1970   COLONOSCOPY  2004   diverticulosis, no polyps [facility])   COLONOSCOPY  05/2016   decreased sphincter tone, diverticulosis, no f/u recommended ([facility])   DEXA  11/2010   T -2.7 spine, -1.9 hip   HIP SURGERY Left 2006   fractured - screws placed   IR ANGIO INTRA EXTRACRAN SEL COM CAROTID INNOMINATE BILAT MOD SED  08/01/2018   IR ANGIO INTRA EXTRACRAN SEL COM CAROTID INNOMINATE BILAT MOD SED  12/10/2018   IR ANGIO INTRA EXTRACRAN SEL COM CAROTID INNOMINATE BILAT MOD SED  06/26/2020   IR ANGIO INTRA EXTRACRAN SEL COM CAROTID  INNOMINATE BILAT MOD SED  04/02/2021   IR ANGIO VERTEBRAL SEL SUBCLAVIAN INNOMINATE BILAT MOD SED  04/02/2021   IR ANGIO VERTEBRAL SEL SUBCLAVIAN INNOMINATE UNI L MOD SED  06/26/2020   IR ANGIO VERTEBRAL SEL SUBCLAVIAN INNOMINATE UNI R MOD SED  08/01/2018   IR ANGIO VERTEBRAL SEL SUBCLAVIAN INNOMINATE UNI R MOD SED  12/10/2018   IR ANGIO VERTEBRAL SEL VERTEBRAL UNI L MOD SED  12/10/2018   IR RADIOLOGIST EVAL & MGMT  01/09/2018   KYPHOPLASTY  10/02/2017   Procedure: LUMBAR FOUR KYPHOPLASTY;  Surgeon: Coletta Memos, MD;  Location: [facility] OR;  Service: Neurosurgery;;   KYPHOPLASTY N/A 01/29/2020   Procedure: Thoracic Ten Kyphoplasty;  Surgeon: Coletta Memos, MD;  Location: [facility] OR;  Service: Neurosurgery;  Laterality: N/A;   RADIOLOGY WITH ANESTHESIA N/A 11/11/2015   Procedure: RADIOLOGY WITH ANESTHESIA;  Surgeon: Julieanne Cotton, MD;  Location: [facility] OR;  Service: Radiology;  Laterality: N/A;   RADIOLOGY WITH ANESTHESIA N/A 12/10/2018   Procedure: STENTING;  Surgeon: Julieanne Cotton, MD;  Location: [facility] OR;  Service: Radiology;  Laterality: N/A;     A IV Location/Drains/Wounds Patient Lines/Drains/Airways Status     Active Line/Drains/Airways     Name Placement date Placement time Site Days   Peripheral IV 12/09/21 20 G Anterior;Distal;Left Forearm 12/09/21  1828  Forearm  less than 1   Incision (Closed) 10/02/17 Back 10/02/17  2029  -- 1529   Incision (Closed) 01/29/20 Back 01/29/20  1727  -- 680   Pressure Injury 11/14/17 Stage I -  Intact skin with non-blanchable redness of a localized area usually over a bony prominence. 11/14/17  1617  -- 1486            Intake/Output Last 24 hours No intake or output data in the 24 hours ending 12/09/21 2047  Labs/Imaging Results for orders placed or performed during the hospital encounter of 12/09/21 (from the past 48 hour(s))  CBC with Differential     Status: Abnormal   Collection Time: 12/09/21  6:30 PM  Result Value Ref Range   WBC 10.9 (H) 4.0 - 10.5  K/uL   RBC 4.09 3.87 - 5.11 MIL/uL   Hemoglobin 12.4 12.0 - 15.0 g/dL   HCT 25.9 56.3 - 87.5 %   MCV 105.6 (H) 80.0 - 100.0 fL   MCH 30.3 26.0 - 34.0 pg   MCHC 28.7 (L) 30.0 - 36.0 g/dL   RDW 64.3 32.9 - 51.8 %   Platelets 186 150 - 400 K/uL   nRBC 0.0 0.0 - 0.2 %   Neutrophils Relative % 64 %   Neutro Abs 7.0 1.7 - 7.7 K/uL   Lymphocytes Relative 24 %  Lymphs Abs 2.6 0.7 - 4.0 K/uL   Monocytes Relative 8 %   Monocytes Absolute 0.9 0.1 - 1.0 K/uL   Eosinophils Relative 2 %   Eosinophils Absolute 0.3 0.0 - 0.5 K/uL   Basophils Relative 1 %   Basophils Absolute 0.1 0.0 - 0.1 K/uL   Immature Granulocytes 1 %   Abs Immature Granulocytes 0.12 (H) 0.00 - 0.07 K/uL    Comment: Performed at [facility], [address], [place], [place] [address]  Comprehensive metabolic panel     Status: Abnormal   Collection Time: 12/09/21  6:30 PM  Result Value Ref Range   Sodium 138 135 - 145 mmol/L   Potassium 4.4 3.5 - 5.1 mmol/L    Comment: HEMOLYSIS AT THIS LEVEL MAY AFFECT RESULT   Chloride 103 98 - 111 mmol/L   CO2 24 22 - 32 mmol/L   Glucose, Bld 123 (H) 70 - 99 mg/dL    Comment: Glucose reference range applies only to samples taken after fasting for at least 8 hours.   BUN 25 (H) 8 - 23 mg/dL   Creatinine, Ser 5.62 (H) 0.44 - 1.00 mg/dL   Calcium 8.7 (L) 8.9 - 10.3 mg/dL   Total Protein 7.2 6.5 - 8.1 g/dL   Albumin 3.9 3.5 - 5.0 g/dL   AST 30 15 - 41 U/L   ALT 13 0 - 44 U/L   Alkaline Phosphatase 120 38 - 126 U/L   Total Bilirubin 0.8 0.3 - 1.2 mg/dL   GFR, Estimated 57 (L) >60 mL/min    Comment: (NOTE) Calculated using the CKD-EPI Creatinine Equation (2021)    Anion gap 11 5 - 15    Comment: Performed at [facility], [address], [place], [place] [address]  Type and screen [facility]     Status: None (Preliminary result)   Collection Time: 12/09/21  7:55 PM  Result Value Ref Range   ABO/RH(D) PENDING    Antibody Screen PENDING    Sample  Expiration      12/12/2021,2359 Performed at [facility], [address] [place], [place] [address]    CT Head Wo Contrast  Result Date: 12/09/2021 CLINICAL DATA:  Status post trauma. EXAM: CT HEAD WITHOUT CONTRAST TECHNIQUE: Contiguous axial images were obtained from the base of the skull through the vertex without intravenous contrast. RADIATION DOSE REDUCTION: This exam was performed according to the departmental dose-optimization program which includes automated exposure control, adjustment of the mA and/or kV according to patient size and/or use of iterative reconstruction technique. COMPARISON:  December 24, 2019 FINDINGS: Brain: There is moderate to marked severity cerebral atrophy with widening of the extra-axial spaces and ventricular dilatation. There are areas of decreased attenuation within the white matter tracts of the supratentorial brain, consistent with microvascular disease changes. Vascular: No hyperdense vessel or unexpected calcification. Skull: Normal. Negative for fracture or focal lesion. Sinuses/Orbits: No acute finding. Other: None. IMPRESSION: 1. No acute intracranial abnormality. 2. Generalized cerebral atrophy. Electronically Signed   By: Aram Candela M.D.   On: 12/09/2021 19:37   CT Cervical Spine Wo Contrast  Result Date: 12/09/2021 CLINICAL DATA:  Fall. EXAM: CT CERVICAL SPINE WITHOUT CONTRAST TECHNIQUE: Multidetector CT imaging of the cervical spine was performed without intravenous contrast. Multiplanar CT image reconstructions were also generated. RADIATION DOSE REDUCTION: This exam was performed according to the departmental dose-optimization program which includes automated exposure control, adjustment of the mA and/or kV according to  patient size and/or use of iterative reconstruction technique. COMPARISON:  Right shoulder x-ray 12/09/2021. FINDINGS: Alignment: Normal. Skull base and vertebrae: No acute fracture. No primary bone lesion or focal pathologic  process. Bones are osteopenic. Soft tissues and spinal canal: No prevertebral fluid or swelling. No visible canal hematoma. Disc levels: Mild degenerative changes are seen at C5-C6 and C6-C7. No significant central canal or neural foraminal stenosis at any level. Upper chest: Negative. Other: There is soft tissue swelling and hematoma surrounding the visualized medial right clavicle. IMPRESSION: 1. No acute fracture or traumatic subluxation of the cervical spine. 2. Soft tissue swelling there is hematoma surrounding the medial right clavicle. Electronically Signed   By: Darliss Cheney M.D.   On: 12/09/2021 19:13   DG Pelvis Portable  Result Date: 12/09/2021 CLINICAL DATA:  Status post fall. EXAM: PORTABLE PELVIS 1-2 VIEWS COMPARISON:  None. FINDINGS: An acute, comminuted fracture deformity is seen extending through the inter trochanteric region of the proximal right femur. There is no evidence of dislocation. Radiopaque surgical screws are seen within the proximal left femur. No pelvic bone lesions are seen. IMPRESSION: Acute intertrochanteric fracture of the proximal right femur. Electronically Signed   By: Aram Candela M.D.   On: 12/09/2021 19:12   DG Chest Portable 1 View  Result Date: 12/09/2021 CLINICAL DATA:  Right shoulder pain.  Fall. EXAM: PORTABLE CHEST 1 VIEW COMPARISON:  Chest x-ray 09/14/2021 FINDINGS: The aorta is tortuous with atherosclerotic calcifications, unchanged. Heart size is within normal limits. The lungs are clear. There is no pleural effusion or pneumothorax. The bones are diffusely osteopenic. There is questionable asymmetric inferior displacement of the proximal right clavicle. No acute fractures are seen. Vertebroplasty changes again noted in the lower thoracic region. IMPRESSION: 1. Findings concerning for dislocation at the right sternoclavicular joint. 2. Lungs are clear. Electronically Signed   By: Darliss Cheney M.D.   On: 12/09/2021 18:59   DG Shoulder Right  Portable  Result Date: 12/09/2021 CLINICAL DATA:  Pain. EXAM: RIGHT SHOULDER - 1 VIEW COMPARISON:  None. FINDINGS: The bones are osteopenic. There is a displaced fracture of the medial right clavicle. There is 1 shaft with inferior displacement of the distal fracture fragment. There is no shoulder dislocation. IMPRESSION: 1. Displaced medial right clavicular fracture. Electronically Signed   By: Darliss Cheney M.D.   On: 12/09/2021 19:00    Pending Labs Unresulted Labs (From admission, onward)    None       Vitals/Pain Today's Vitals   12/09/21 1822 12/09/21 1830 12/09/21 1845 12/09/21 1954  BP:  (!) 146/84 (!) 106/93 (!) 136/54  Pulse:  88 80 79  Resp:  17 18 (!) 22  SpO2:  90% 97% 95%  PainSc: 10-Worst pain ever  10-Worst pain ever     Isolation Precautions No active isolations  Medications Medications  fentaNYL (SUBLIMAZE) injection 50 mcg (has no administration in time range)  fentaNYL (SUBLIMAZE) injection 50 mcg (50 mcg Intravenous Given 12/09/21 1849)    Mobility non-ambulatory High fall risk     R Recommendations: See Admitting Provider Note  Report given to:   Additional Notes:

## 2021-12-09 NOTE — Assessment & Plan Note (Signed)
Management per ortho. Right arm sling. ?

## 2021-12-09 NOTE — Assessment & Plan Note (Signed)
Stable

## 2021-12-09 NOTE — Assessment & Plan Note (Signed)
Stable. On lasix. Euvolemic. ?

## 2021-12-09 NOTE — Subjective & Objective (Signed)
CC: fall at home ?HPI: ?76 year old female with a history of morbid obesity, hypertension, peripheral vascular disease, CKD stage III presents the ER today after a fall at home.  Patient states that she was walking without her walker.  She knows she supposed use a walker.  She got tripped up on her dogs.  She fell onto her right side.  She had pain in her right thigh and her right shoulder.  Unable to move her right leg.  Brought to the ER. ? ?On arrival heart rate 88 blood pressure 146/84 satting 90% on room air. ? ?Labs showed a BUN of 25 creatinine 1.0 ? ?White count 10.9, hemoglobin 12.4, platelets 186 ? ?CT cervical spine negative for acute fracture ? ?CT head negative for acute intracranial abnormality. ? ?Right shoulder x-ray demonstrates displaced fracture of the clavicle ? ?Pelvic x-rays showed acute comminuted fracture through the intertrochanteric region of the right proximal femur. ? ?EDP is contacted Dr. Doran Durand with [facility].  They want the patient to be admitted to [facility] for surgery tomorrow. ? ?[facility] hospitalist contacted for admission. ? ? ?

## 2021-12-09 NOTE — H&P (View-Only) (Signed)
Reason for Consult:right hip and shoulder pain ?Referring Physician: Dr.Lockwood ? ?[name] [name] is an 76 y.o. female.  ?HPI: 76 y/o female with complicated PMH fell earlier today landing on her right side.  She broke her left hip and had surgery by Dr. Alphonzo Cruise back in 2006.  She c/o right hip and right shoulder pain.  Both are worse with motion and better with rest.  Daughter at bedside who provides history as well.  She takes plavix due to her h/o stroke. ? ?Past Medical History:  ?Diagnosis Date  ? (HFpEF) heart failure with preserved ejection fraction ([place])   ? a. 11/2019 Echo: EF 60-65%, no rwma. Mild LVH. Mild AI, mild to mod AS.  ? Aneurysm ([place])   ? She had 2.6 cm dilation of the infrarenal abdominal aorta on 08/01/15 CTA with 5 year [place] recommended  ? Anxiety   ? Anxiety and depression   ? Aortic stenosis   ? mild-moderate AS 12/23/19 echo  ? Carotid stenosis   ? a. 06/2020 Carotid/Cerebral angio: LICA 70, L Vert 50, RCCA 50-60d, RICA 60-70p, 3.3x2.3 RICA aneurysm, RMCA M1 50-70.  ? CHF (congestive heart failure) ([place])   ? Compression fracture of body of thoracic vertebra (HCC)   ? T 10  ? Concussion 08/03/2015  ? COPD (chronic obstructive pulmonary disease) ([place]) 12/2012  ? spirometry: Pre: FVC 84%, FEV1 69%, ratio 0.64 consistent with moderate obstruction.  ? Depression   ? Fall 08/03/2015  ? d/c home health 08/2015  ? Fracture of cervical vertebra, C5 (HCC) 08/06/2015  ? History of chicken pox   ? History of stress test   ? a. 01/2019 MV: No isch/infarct. EF 71%.  ? Hyperlipidemia   ? Hypertension   ? Hypothyroidism   ? Lower back pain   ? h/o HNP s/p surgery  ? Neuropathy   ? B/L feet  ? On supplemental oxygen by nasal cannula   ? at HS and PRN during the day  ? Osteoarthritis   ? h/o ruptured disc s/p ESI  ? Osteoporosis 11/2010  ? DEXA -2.7 spine, thoracic compression fracture  ? Peripheral vascular disease ([place])   ? Smoker   ? quit 10/2012  ? Stroke [facility]) 2010  ? x3 with residual R hemiparesis, s/p R  MCA balloon angioplasty (2010)  ? Wears dentures   ? upper  ? ? ?Past Surgical History:  ?Procedure Laterality Date  ? APPENDECTOMY  1960  ? CATARACT EXTRACTION    ? bilateral  ? CESAREAN SECTION    ? CHOLECYSTECTOMY  1970  ? COLONOSCOPY  2004  ? diverticulosis, no polyps Sharlett Iles)  ? COLONOSCOPY  05/2016  ? decreased sphincter tone, diverticulosis, no f/u recommended ([place])  ? DEXA  11/2010  ? T -2.7 spine, -1.9 hip  ? HIP SURGERY Left 2006  ? fractured - screws placed  ? IR ANGIO INTRA EXTRACRAN SEL COM CAROTID INNOMINATE BILAT MOD SED  08/01/2018  ? IR ANGIO INTRA EXTRACRAN SEL COM CAROTID INNOMINATE BILAT MOD SED  12/10/2018  ? IR ANGIO INTRA EXTRACRAN SEL COM CAROTID INNOMINATE BILAT MOD SED  06/26/2020  ? IR ANGIO INTRA EXTRACRAN SEL COM CAROTID INNOMINATE BILAT MOD SED  04/02/2021  ? IR ANGIO VERTEBRAL SEL SUBCLAVIAN INNOMINATE BILAT MOD SED  04/02/2021  ? IR ANGIO VERTEBRAL SEL SUBCLAVIAN INNOMINATE UNI L MOD SED  06/26/2020  ? IR ANGIO VERTEBRAL SEL SUBCLAVIAN INNOMINATE UNI R MOD SED  08/01/2018  ? IR ANGIO VERTEBRAL SEL SUBCLAVIAN INNOMINATE UNI  R MOD SED  12/10/2018  ? IR ANGIO VERTEBRAL SEL VERTEBRAL UNI L MOD SED  12/10/2018  ? IR RADIOLOGIST EVAL & MGMT  01/09/2018  ? KYPHOPLASTY  10/02/2017  ? Procedure: LUMBAR FOUR KYPHOPLASTY;  Surgeon: Ashok Pall, MD;  Location: [place];  Service: Neurosurgery;;  ? KYPHOPLASTY N/A 01/29/2020  ? Procedure: Thoracic Ten Kyphoplasty;  Surgeon: Ashok Pall, MD;  Location: [place];  Service: Neurosurgery;  Laterality: N/A;  ? RADIOLOGY WITH ANESTHESIA N/A 11/11/2015  ? Procedure: RADIOLOGY WITH ANESTHESIA;  Surgeon: Luanne Bras, MD;  Location: [place];  Service: Radiology;  Laterality: N/A;  ? RADIOLOGY WITH ANESTHESIA N/A 12/10/2018  ? Procedure: STENTING;  Surgeon: Luanne Bras, MD;  Location: [place];  Service: Radiology;  Laterality: N/A;  ? ? ?Family History  ?Problem Relation Age of Onset  ? CAD Mother   ?     MI  ? Cancer Mother   ?     cervical  ? CAD Maternal Aunt    ?     MI  ? CAD Sister   ?     MI  ? Sudden death Father 14  ?     died in his sleep, chain smoker  ? Cirrhosis Father   ? Hypertension Daughter   ? Diabetes Maternal Aunt   ? Cancer Maternal Grandmother   ?     cervical  ? ? ?Social History:  reports that she quit smoking about 9 years ago. Her smoking use included cigarettes. She has a 92.00 pack-year smoking history. She has never used smokeless tobacco. She reports that she does not drink alcohol and does not use drugs. ? ?Allergies:  ?Allergies  ?Allergen Reactions  ? Doxycycline Nausea Only and Other (See Comments)  ?  GI upset ?  ? Oxycontin [Oxycodone] Other (See Comments)  ?  Overly-sedating, causes hypotension, decreased oxgyen  ? Percocet [Oxycodone-Acetaminophen] Other (See Comments)  ?  Overly-sedating, causes hypotension, decreased oxgyen  ? ? ?Medications: I have reviewed the patient's current medications. ? ?Results for orders placed or performed during the hospital encounter of 12/19/2021 (from the past 48 hour(s))  ?CBC with Differential     Status: Abnormal  ? Collection Time: 12/14/2021  6:30 PM  ?Result Value Ref Range  ? WBC 10.9 (H) 4.0 - 10.5 K/uL  ? RBC 4.09 3.87 - 5.11 MIL/uL  ? Hemoglobin 12.4 12.0 - 15.0 g/dL  ? HCT 43.2 36.0 - 46.0 %  ? MCV 105.6 (H) 80.0 - 100.0 fL  ? MCH 30.3 26.0 - 34.0 pg  ? MCHC 28.7 (L) 30.0 - 36.0 g/dL  ? RDW 13.1 11.5 - 15.5 %  ? Platelets 186 150 - 400 K/uL  ? nRBC 0.0 0.0 - 0.2 %  ? Neutrophils Relative % 64 %  ? Neutro Abs 7.0 1.7 - 7.7 K/uL  ? Lymphocytes Relative 24 %  ? Lymphs Abs 2.6 0.7 - 4.0 K/uL  ? Monocytes Relative 8 %  ? Monocytes Absolute 0.9 0.1 - 1.0 K/uL  ? Eosinophils Relative 2 %  ? Eosinophils Absolute 0.3 0.0 - 0.5 K/uL  ? Basophils Relative 1 %  ? Basophils Absolute 0.1 0.0 - 0.1 K/uL  ? Immature Granulocytes 1 %  ? Abs Immature Granulocytes 0.12 (H) 0.00 - 0.07 K/uL  ?  Comment: Performed at [facility], [place] [address], [place], [place] [address]  ?Comprehensive metabolic panel      Status: Abnormal  ? Collection Time: 11/29/2021  6:30 PM  ?  Result Value Ref Range  ? Sodium 138 135 - 145 mmol/L  ? Potassium 4.4 3.5 - 5.1 mmol/L  ?  Comment: HEMOLYSIS AT THIS LEVEL MAY AFFECT RESULT  ? Chloride 103 98 - 111 mmol/L  ? CO2 24 22 - 32 mmol/L  ? Glucose, Bld 123 (H) 70 - 99 mg/dL  ?  Comment: Glucose reference range applies only to samples taken after fasting for at least 8 hours.  ? BUN 25 (H) 8 - 23 mg/dL  ? Creatinine, Ser 1.03 (H) 0.44 - 1.00 mg/dL  ? Calcium 8.7 (L) 8.9 - 10.3 mg/dL  ? Total Protein 7.2 6.5 - 8.1 g/dL  ? Albumin 3.9 3.5 - 5.0 g/dL  ? AST 30 15 - 41 U/L  ? ALT 13 0 - 44 U/L  ? Alkaline Phosphatase 120 38 - 126 U/L  ? Total Bilirubin 0.8 0.3 - 1.2 mg/dL  ? GFR, Estimated 57 (L) >60 mL/min  ?  Comment: (NOTE) ?Calculated using the CKD-EPI Creatinine Equation (2021) ?  ? Anion gap 11 5 - 15  ?  Comment: Performed at [facility], [place] [address] [place], [place] [address]  ?Type and screen [place]     Status: None  ? Collection Time: 11/29/2021  7:55 PM  ?Result Value Ref Range  ? ABO/RH(D) B POS   ? Antibody Screen NEG   ? Sample Expiration    ?  11/24/2021,2359 ?Performed at [facility], [place] [address] [place], [place] [address] ?  ? ? ?CT Head Wo Contrast ? ?Result Date: 12/20/2021 ?CLINICAL DATA:  Status post trauma. EXAM: CT HEAD WITHOUT CONTRAST TECHNIQUE: Contiguous axial images were obtained from the base of the skull through the vertex without intravenous contrast. RADIATION DOSE REDUCTION: This exam was performed according to the departmental dose-optimization program which includes automated exposure control, adjustment of the mA and/or kV according to patient size and/or use of iterative reconstruction technique. COMPARISON:  December 24, 2019 FINDINGS: Brain: There is moderate to marked severity cerebral atrophy with widening of the extra-axial spaces and ventricular dilatation. There are areas of decreased attenuation within the white matter  tracts of the supratentorial brain, consistent with microvascular disease changes. Vascular: No hyperdense vessel or unexpected calcification. Skull: Normal. Negative for fracture or focal lesion. Sinuses/Or

## 2021-12-09 NOTE — Assessment & Plan Note (Signed)
On fosamax at home. ?

## 2021-12-09 NOTE — Assessment & Plan Note (Addendum)
Admit to med/surg bed. Orthopedics has been consulted by EDP(Hewitt with [facility]). NPO after MN. EDP spoke with ortho. Relayed that ortho wants pt to be admitted to [facility]. Scheduled tylenol and ultram for pain. Pt takes ultram at home. dtr patty states that oxycodone causes profound drop in pt's blood pressure in the past. Defer DVT prophylaxis to orthopedic service. ?

## 2021-12-09 NOTE — Progress Notes (Signed)
Orthopedic Tech Progress Note ?Patient Details:  ?[name] [name] ?Apr 13, 1946 ?[id number] ? ?Ortho Devices ?Type of Ortho Device: Arm sling ?Ortho Device/Splint Location: RUE ?Ortho Device/Splint Interventions: Application ?  ?Post Interventions ?Patient Tolerated: Well ? ?[name] [name] ?12/07/2021, 9:14 PM ? ?

## 2021-12-09 NOTE — Assessment & Plan Note (Signed)
Nystatin powder to groin. Needs better personal hygiene. ?

## 2021-12-09 NOTE — Assessment & Plan Note (Signed)
Continue ASA

## 2021-12-09 NOTE — ED Triage Notes (Addendum)
Pt BIB [facility] from home d/t mechanical fall caused by stumbling back d/t her dogs. Fell backwards & hit the back of her head against a bookshelf does have hematoma there. Rotation to Right leg is noted (per EMS). A/Ox4, is on plavix, uses walker at baseline. VSS. ?

## 2021-12-09 NOTE — Assessment & Plan Note (Signed)
Continue synthroid.

## 2021-12-09 NOTE — ED Notes (Signed)
Patient transported to CT 

## 2021-12-09 NOTE — Consult Note (Signed)
Reason for Consult:right hip and shoulder pain Referring Physician: Dr.Lockwood  [name] [name] is an 76 y.o. female.  HPI: 76 y/o female with complicated PMH fell earlier today landing on her right side.  She broke her left hip and had surgery by Dr. Chaney Malling back in 2006.  She c/o right hip and right shoulder pain.  Both are worse with motion and better with rest.  Daughter at bedside who provides history as well.  She takes plavix due to her h/o stroke.  Past Medical History:  Diagnosis Date   (HFpEF) heart failure with preserved ejection fraction (HCC)    a. 11/2019 Echo: EF 60-65%, no rwma. Mild LVH. Mild AI, mild to mod AS.   Aneurysm [facility])    She had 2.6 cm dilation of the infrarenal abdominal aorta on 08/01/15 CTA with 5 year [place] recommended   Anxiety    Anxiety and depression    Aortic stenosis    mild-moderate AS 12/23/19 echo   Carotid stenosis    a. 06/2020 Carotid/Cerebral angio: LICA 70, L Vert 50, RCCA 50-60d, RICA 60-70p, 3.3x2.3 RICA aneurysm, RMCA M1 50-70.   CHF (congestive heart failure) (HCC)    Compression fracture of body of thoracic vertebra (HCC)    T 10   Concussion 08/03/2015   COPD (chronic obstructive pulmonary disease) (HCC) 12/2012   spirometry: Pre: FVC 84%, FEV1 69%, ratio 0.64 consistent with moderate obstruction.   Depression    Fall 08/03/2015   d/c home health 08/2015   Fracture of cervical vertebra, C5 (HCC) 08/06/2015   History of chicken pox    History of stress test    a. 01/2019 MV: No isch/infarct. EF 71%.   Hyperlipidemia    Hypertension    Hypothyroidism    Lower back pain    h/o HNP s/p surgery   Neuropathy    B/L feet   On supplemental oxygen by nasal cannula    at HS and PRN during the day   Osteoarthritis    h/o ruptured disc s/p ESI   Osteoporosis 11/2010   DEXA -2.7 spine, thoracic compression fracture   Peripheral vascular disease (HCC)    Smoker    quit 10/2012   Stroke [facility]) 2010   x3 with residual R hemiparesis, s/p R  MCA balloon angioplasty (2010)   Wears dentures    upper    Past Surgical History:  Procedure Laterality Date   APPENDECTOMY  1960   CATARACT EXTRACTION     bilateral   CESAREAN SECTION     CHOLECYSTECTOMY  1970   COLONOSCOPY  2004   diverticulosis, no polyps Jarold Motto)   COLONOSCOPY  05/2016   decreased sphincter tone, diverticulosis, no f/u recommended ([place])   DEXA  11/2010   T -2.7 spine, -1.9 hip   HIP SURGERY Left 2006   fractured - screws placed   IR ANGIO INTRA EXTRACRAN SEL COM CAROTID INNOMINATE BILAT MOD SED  08/01/2018   IR ANGIO INTRA EXTRACRAN SEL COM CAROTID INNOMINATE BILAT MOD SED  12/10/2018   IR ANGIO INTRA EXTRACRAN SEL COM CAROTID INNOMINATE BILAT MOD SED  06/26/2020   IR ANGIO INTRA EXTRACRAN SEL COM CAROTID INNOMINATE BILAT MOD SED  04/02/2021   IR ANGIO VERTEBRAL SEL SUBCLAVIAN INNOMINATE BILAT MOD SED  04/02/2021   IR ANGIO VERTEBRAL SEL SUBCLAVIAN INNOMINATE UNI L MOD SED  06/26/2020   IR ANGIO VERTEBRAL SEL SUBCLAVIAN INNOMINATE UNI R MOD SED  08/01/2018   IR ANGIO VERTEBRAL SEL SUBCLAVIAN INNOMINATE UNI  R MOD SED  12/10/2018   IR ANGIO VERTEBRAL SEL VERTEBRAL UNI L MOD SED  12/10/2018   IR RADIOLOGIST EVAL & MGMT  01/09/2018   KYPHOPLASTY  10/02/2017   Procedure: LUMBAR FOUR KYPHOPLASTY;  Surgeon: Coletta Memos, MD;  Location: [facility] OR;  Service: Neurosurgery;;   KYPHOPLASTY N/A 01/29/2020   Procedure: Thoracic Ten Kyphoplasty;  Surgeon: Coletta Memos, MD;  Location: [facility] OR;  Service: Neurosurgery;  Laterality: N/A;   RADIOLOGY WITH ANESTHESIA N/A 11/11/2015   Procedure: RADIOLOGY WITH ANESTHESIA;  Surgeon: Julieanne Cotton, MD;  Location: [facility] OR;  Service: Radiology;  Laterality: N/A;   RADIOLOGY WITH ANESTHESIA N/A 12/10/2018   Procedure: STENTING;  Surgeon: Julieanne Cotton, MD;  Location: [facility] OR;  Service: Radiology;  Laterality: N/A;    Family History  Problem Relation Age of Onset   CAD Mother        MI   Cancer Mother        cervical   CAD Maternal Aunt         MI   CAD Sister        MI   Sudden death Father 43       died in his sleep, chain smoker   Cirrhosis Father    Hypertension Daughter    Diabetes Maternal Aunt    Cancer Maternal Grandmother        cervical    Social History:  reports that she quit smoking about 9 years ago. Her smoking use included cigarettes. She has a 92.00 pack-year smoking history. She has never used smokeless tobacco. She reports that she does not drink alcohol and does not use drugs.  Allergies:  Allergies  Allergen Reactions   Doxycycline Nausea Only and Other (See Comments)    GI upset    Oxycontin [Oxycodone] Other (See Comments)    Overly-sedating, causes hypotension, decreased oxgyen   Percocet [Oxycodone-Acetaminophen] Other (See Comments)    Overly-sedating, causes hypotension, decreased oxgyen    Medications: I have reviewed the patient's current medications.  Results for orders placed or performed during the hospital encounter of 12/09/21 (from the past 48 hour(s))  CBC with Differential     Status: Abnormal   Collection Time: 12/09/21  6:30 PM  Result Value Ref Range   WBC 10.9 (H) 4.0 - 10.5 K/uL   RBC 4.09 3.87 - 5.11 MIL/uL   Hemoglobin 12.4 12.0 - 15.0 g/dL   HCT 19.1 47.8 - 29.5 %   MCV 105.6 (H) 80.0 - 100.0 fL   MCH 30.3 26.0 - 34.0 pg   MCHC 28.7 (L) 30.0 - 36.0 g/dL   RDW 62.1 30.8 - 65.7 %   Platelets 186 150 - 400 K/uL   nRBC 0.0 0.0 - 0.2 %   Neutrophils Relative % 64 %   Neutro Abs 7.0 1.7 - 7.7 K/uL   Lymphocytes Relative 24 %   Lymphs Abs 2.6 0.7 - 4.0 K/uL   Monocytes Relative 8 %   Monocytes Absolute 0.9 0.1 - 1.0 K/uL   Eosinophils Relative 2 %   Eosinophils Absolute 0.3 0.0 - 0.5 K/uL   Basophils Relative 1 %   Basophils Absolute 0.1 0.0 - 0.1 K/uL   Immature Granulocytes 1 %   Abs Immature Granulocytes 0.12 (H) 0.00 - 0.07 K/uL    Comment: Performed at [facility], [address], [place], [place] [address]  Comprehensive metabolic panel      Status: Abnormal   Collection Time: 12/09/21  6:30 PM  Result Value Ref Range   Sodium 138 135 - 145 mmol/L   Potassium 4.4 3.5 - 5.1 mmol/L    Comment: HEMOLYSIS AT THIS LEVEL MAY AFFECT RESULT   Chloride 103 98 - 111 mmol/L   CO2 24 22 - 32 mmol/L   Glucose, Bld 123 (H) 70 - 99 mg/dL    Comment: Glucose reference range applies only to samples taken after fasting for at least 8 hours.   BUN 25 (H) 8 - 23 mg/dL   Creatinine, Ser 5.78 (H) 0.44 - 1.00 mg/dL   Calcium 8.7 (L) 8.9 - 10.3 mg/dL   Total Protein 7.2 6.5 - 8.1 g/dL   Albumin 3.9 3.5 - 5.0 g/dL   AST 30 15 - 41 U/L   ALT 13 0 - 44 U/L   Alkaline Phosphatase 120 38 - 126 U/L   Total Bilirubin 0.8 0.3 - 1.2 mg/dL   GFR, Estimated 57 (L) >60 mL/min    Comment: (NOTE) Calculated using the CKD-EPI Creatinine Equation (2021)    Anion gap 11 5 - 15    Comment: Performed at [facility], [address], [place], [place] [address]  Type and screen [facility]     Status: None   Collection Time: 12/09/21  7:55 PM  Result Value Ref Range   ABO/RH(D) B POS    Antibody Screen NEG    Sample Expiration      12/12/2021,2359 Performed at [facility], [address] [place], [place] [address]     CT Head Wo Contrast  Result Date: 12/09/2021 CLINICAL DATA:  Status post trauma. EXAM: CT HEAD WITHOUT CONTRAST TECHNIQUE: Contiguous axial images were obtained from the base of the skull through the vertex without intravenous contrast. RADIATION DOSE REDUCTION: This exam was performed according to the departmental dose-optimization program which includes automated exposure control, adjustment of the mA and/or kV according to patient size and/or use of iterative reconstruction technique. COMPARISON:  December 24, 2019 FINDINGS: Brain: There is moderate to marked severity cerebral atrophy with widening of the extra-axial spaces and ventricular dilatation. There are areas of decreased attenuation within the white matter  tracts of the supratentorial brain, consistent with microvascular disease changes. Vascular: No hyperdense vessel or unexpected calcification. Skull: Normal. Negative for fracture or focal lesion. Sinuses/Orbits: No acute finding. Other: None. IMPRESSION: 1. No acute intracranial abnormality. 2. Generalized cerebral atrophy. Electronically Signed   By: Aram Candela M.D.   On: 12/09/2021 19:37   CT Cervical Spine Wo Contrast  Result Date: 12/09/2021 CLINICAL DATA:  Fall. EXAM: CT CERVICAL SPINE WITHOUT CONTRAST TECHNIQUE: Multidetector CT imaging of the cervical spine was performed without intravenous contrast. Multiplanar CT image reconstructions were also generated. RADIATION DOSE REDUCTION: This exam was performed according to the departmental dose-optimization program which includes automated exposure control, adjustment of the mA and/or kV according to patient size and/or use of iterative reconstruction technique. COMPARISON:  Right shoulder x-ray 12/09/2021. FINDINGS: Alignment: Normal. Skull base and vertebrae: No acute fracture. No primary bone lesion or focal pathologic process. Bones are osteopenic. Soft tissues and spinal canal: No prevertebral fluid or swelling. No visible canal hematoma. Disc levels: Mild degenerative changes are seen at C5-C6 and C6-C7. No significant central canal or neural foraminal stenosis at any level. Upper chest: Negative. Other: There is soft tissue swelling and hematoma surrounding the visualized medial right clavicle. IMPRESSION: 1. No acute fracture or traumatic subluxation of the cervical spine. 2. Soft tissue swelling there  is hematoma surrounding the medial right clavicle. Electronically Signed   By: Darliss Cheney M.D.   On: 12/09/2021 19:13   DG Pelvis Portable  Result Date: 12/09/2021 CLINICAL DATA:  Status post fall. EXAM: PORTABLE PELVIS 1-2 VIEWS COMPARISON:  None. FINDINGS: An acute, comminuted fracture deformity is seen extending through the inter  trochanteric region of the proximal right femur. There is no evidence of dislocation. Radiopaque surgical screws are seen within the proximal left femur. No pelvic bone lesions are seen. IMPRESSION: Acute intertrochanteric fracture of the proximal right femur. Electronically Signed   By: Aram Candela M.D.   On: 12/09/2021 19:12   DG Chest Portable 1 View  Result Date: 12/09/2021 CLINICAL DATA:  Right shoulder pain.  Fall. EXAM: PORTABLE CHEST 1 VIEW COMPARISON:  Chest x-ray 09/14/2021 FINDINGS: The aorta is tortuous with atherosclerotic calcifications, unchanged. Heart size is within normal limits. The lungs are clear. There is no pleural effusion or pneumothorax. The bones are diffusely osteopenic. There is questionable asymmetric inferior displacement of the proximal right clavicle. No acute fractures are seen. Vertebroplasty changes again noted in the lower thoracic region. IMPRESSION: 1. Findings concerning for dislocation at the right sternoclavicular joint. 2. Lungs are clear. Electronically Signed   By: Darliss Cheney M.D.   On: 12/09/2021 18:59   DG Shoulder Right Portable  Result Date: 12/09/2021 CLINICAL DATA:  Pain. EXAM: RIGHT SHOULDER - 1 VIEW COMPARISON:  None. FINDINGS: The bones are osteopenic. There is a displaced fracture of the medial right clavicle. There is 1 shaft with inferior displacement of the distal fracture fragment. There is no shoulder dislocation. IMPRESSION: 1. Displaced medial right clavicular fracture. Electronically Signed   By: Darliss Cheney M.D.   On: 12/09/2021 19:00    ROS:  no recent f/c/n/v/wt loss.  No recent falls.  10 system review o/w negative PE:  Blood pressure 133/81, pulse 88, temperature 97.8 F (36.6 C), temperature source Oral, resp. rate 17, SpO2 97 %. Wn wd woman in nad.  A and Ox 4.  NOrmal mood anda ffect.  EOMI.  Resp unlabored.  R LE shortened and ER.  Brisk cap refill at the toes.  Wiggles toes.  Feels LT in R foot.  No lymphaenopathy.  TTP  at the R clavicle.  Intact motor and sensory function at the radial, median and ulnar n dist.  Assessment/Plan: R hip IT fracture - to OR tomorrow with Dr. Linna Caprice for open treatment of the right hip fracture with intramedullary nailing.  NPO after midnight.  Hold blood thinners.  R clavicle fracture - sling for comfort.    Toni Arthurs 12/09/2021, 9:40 PM

## 2021-12-09 NOTE — Assessment & Plan Note (Signed)
-  Continue zoloft  

## 2021-12-09 NOTE — H&P (Addendum)
History and Physical    [name] [name] IOX:[id number] DOB: 1946-04-29 DOA: 12/09/2021  DOS: the patient was seen and examined on 12/09/2021  PCP: [name], MD   Patient coming from: Home  I have personally briefly reviewed patient's old medical records in [facility]  CC: fall at home HPI: 76 year old female with a history of morbid obesity, hypertension, peripheral vascular disease, CKD stage III presents the ER today after a fall at home.  Patient states that she was walking without her walker.  She knows she supposed use a walker.  She got tripped up on her dogs.  She fell onto her right side.  She had pain in her right thigh and her right shoulder.  Unable to move her right leg.  Brought to the ER.  On arrival heart rate 88 blood pressure 146/84 satting 90% on room air.  Labs showed a BUN of 25 creatinine 1.0  White count 10.9, hemoglobin 12.4, platelets 186  CT cervical spine negative for acute fracture  CT head negative for acute intracranial abnormality.  Right shoulder x-ray demonstrates displaced fracture of the clavicle  Pelvic x-rays showed acute comminuted fracture through the intertrochanteric region of the right proximal femur.  EDP is contacted Dr. Victorino Dike with [facility].  They want the patient to be admitted to [facility] for surgery tomorrow.  [facility] contacted for admission.     ED Course: xray shows right clavicle and right intertroch fracture of right femur  Review of Systems:  Review of Systems  Constitutional: Negative.   HENT: Negative.    Eyes: Negative.   Respiratory: Negative.    Cardiovascular: Negative.   Gastrointestinal: Negative.   Genitourinary: Negative.   Musculoskeletal:  Positive for falls.       Right shoulder and right thigh pain  Skin: Negative.   Neurological: Negative.   Endo/Heme/Allergies: Negative.   Psychiatric/Behavioral: Negative.     Past Medical History:  Diagnosis Date   (HFpEF) heart  failure with preserved ejection fraction (HCC)    a. 11/2019 Echo: EF 60-65%, no rwma. Mild LVH. Mild AI, mild to mod AS.   Aneurysm [facility])    She had 2.6 cm dilation of the infrarenal abdominal aorta on 08/01/15 CTA with 5 year [place] recommended   Anxiety    Anxiety and depression    Aortic stenosis    mild-moderate AS 12/23/19 echo   Carotid stenosis    a. 06/2020 Carotid/Cerebral angio: LICA 70, L Vert 50, RCCA 50-60d, RICA 60-70p, 3.3x2.3 RICA aneurysm, RMCA M1 50-70.   CHF (congestive heart failure) (HCC)    Compression fracture of body of thoracic vertebra (HCC)    T 10   Concussion 08/03/2015   COPD (chronic obstructive pulmonary disease) (HCC) 12/2012   spirometry: Pre: FVC 84%, FEV1 69%, ratio 0.64 consistent with moderate obstruction.   Depression    Fall 08/03/2015   d/c home health 08/2015   Fracture of cervical vertebra, C5 (HCC) 08/06/2015   History of chicken pox    History of stress test    a. 01/2019 MV: No isch/infarct. EF 71%.   Hyperlipidemia    Hypertension    Hypothyroidism    Lower back pain    h/o HNP s/p surgery   Neuropathy    B/L feet   On supplemental oxygen by nasal cannula    at HS and PRN during the day   Osteoarthritis    h/o ruptured disc s/p ESI   Osteoporosis 11/2010   DEXA -  2.7 spine, thoracic compression fracture   Peripheral vascular disease (HCC)    Smoker    quit 10/2012   Stroke [facility]) 2010   x3 with residual R hemiparesis, s/p R MCA balloon angioplasty (2010)   Wears dentures    upper    Past Surgical History:  Procedure Laterality Date   APPENDECTOMY  1960   CATARACT EXTRACTION     bilateral   CESAREAN SECTION     CHOLECYSTECTOMY  1970   COLONOSCOPY  2004   diverticulosis, no polyps [facility])   COLONOSCOPY  05/2016   decreased sphincter tone, diverticulosis, no f/u recommended ([place])   DEXA  11/2010   T -2.7 spine, -1.9 hip   HIP SURGERY Left 2006   fractured - screws placed   IR ANGIO INTRA EXTRACRAN SEL COM CAROTID  INNOMINATE BILAT MOD SED  08/01/2018   IR ANGIO INTRA EXTRACRAN SEL COM CAROTID INNOMINATE BILAT MOD SED  12/10/2018   IR ANGIO INTRA EXTRACRAN SEL COM CAROTID INNOMINATE BILAT MOD SED  06/26/2020   IR ANGIO INTRA EXTRACRAN SEL COM CAROTID INNOMINATE BILAT MOD SED  04/02/2021   IR ANGIO VERTEBRAL SEL SUBCLAVIAN INNOMINATE BILAT MOD SED  04/02/2021   IR ANGIO VERTEBRAL SEL SUBCLAVIAN INNOMINATE UNI L MOD SED  06/26/2020   IR ANGIO VERTEBRAL SEL SUBCLAVIAN INNOMINATE UNI R MOD SED  08/01/2018   IR ANGIO VERTEBRAL SEL SUBCLAVIAN INNOMINATE UNI R MOD SED  12/10/2018   IR ANGIO VERTEBRAL SEL VERTEBRAL UNI L MOD SED  12/10/2018   IR RADIOLOGIST EVAL & MGMT  01/09/2018   KYPHOPLASTY  10/02/2017   Procedure: LUMBAR FOUR KYPHOPLASTY;  Surgeon: Coletta Memos, MD;  Location: [facility] OR;  Service: Neurosurgery;;   KYPHOPLASTY N/A 01/29/2020   Procedure: Thoracic Ten Kyphoplasty;  Surgeon: Coletta Memos, MD;  Location: [facility] OR;  Service: Neurosurgery;  Laterality: N/A;   RADIOLOGY WITH ANESTHESIA N/A 11/11/2015   Procedure: RADIOLOGY WITH ANESTHESIA;  Surgeon: Julieanne Cotton, MD;  Location: [facility] OR;  Service: Radiology;  Laterality: N/A;   RADIOLOGY WITH ANESTHESIA N/A 12/10/2018   Procedure: STENTING;  Surgeon: Julieanne Cotton, MD;  Location: [facility] OR;  Service: Radiology;  Laterality: N/A;     reports that she quit smoking about 9 years ago. Her smoking use included cigarettes. She has a 92.00 pack-year smoking history. She has never used smokeless tobacco. She reports that she does not drink alcohol and does not use drugs.  Allergies  Allergen Reactions   Doxycycline Nausea Only and Other (See Comments)    GI upset    Oxycodone Other (See Comments)    Overly sedating, hypotension, decreased oxgyen    Family History  Problem Relation Age of Onset   CAD Mother        MI   Cancer Mother        cervical   CAD Maternal Aunt        MI   CAD Sister        MI   Sudden death Father 46       died in his sleep, chain  smoker   Cirrhosis Father    Hypertension Daughter    Diabetes Maternal Aunt    Cancer Maternal Grandmother        cervical    Prior to Admission medications   Medication Sig Start Date End Date Taking? Authorizing Provider  acetaminophen (TYLENOL) 500 MG tablet Take 1 tablet (500 mg total) by mouth every 6 (six) hours as needed for headache. 01/01/19  [name], MD  albuterol (VENTOLIN HFA) 108 (90 Base) MCG/ACT inhaler INHALE 2 PUFFS INTO THE LUNGS EVERY 6 (SIX) HOURS AS NEEDED FOR WHEEZING OR SHORTNESS OF BREATH. 02/03/20   [name], MD  alendronate (FOSAMAX) 70 MG tablet TAKE 1 TABLET EVERY WEEK WITH A FULL GLASS OF WATER ON AN EMPTY STOMACH. 10/13/21   [name], MD  aspirin (ASPIRIN 81) 81 MG EC tablet Take 1 tablet (81 mg total) by mouth daily. Swallow whole. 12/15/18   [name], MD  atorvastatin (LIPITOR) 40 MG tablet TAKE 1 TABLET EVERY DAY 12/03/21   [name], MD  Cholecalciferol (VITAMIN D) 50 MCG (2000 UT) tablet Take 2,000 Units by mouth daily.    [provider]  clopidogrel (PLAVIX) 75 MG tablet TAKE 1 TABLET (75 MG TOTAL) BY MOUTH DAILY. 06/21/21   [name], MD  Cranberry-Vitamin C (AZO CRANBERRY URINARY TRACT PO) Take 1 tablet by mouth in the morning and at bedtime.    [provider]  estradiol (ESTRACE) 0.1 MG/GM vaginal cream Estrogen Cream Instruction Discard applicator Apply pea sized amount to tip of finger to urethra before bed. Wash hands well after application. Use Monday, Wednesday and Friday 11/06/20   Sondra Come, MD  ezetimibe (ZETIA) 10 MG tablet Take 1 tablet (10 mg total) by mouth daily. 09/28/21   Antonieta Iba, MD  fluconazole (DIFLUCAN) 150 MG tablet Take 1 tablet (150 mg total) by mouth once a week. 10/29/21   [name], MD  fluticasone-salmeterol (ADVAIR HFA) [phone] MCG/ACT inhaler Inhale 2 puffs into the lungs 2 (two) times daily. 06/02/21   [name], MD  furosemide (LASIX)  20 MG tablet Take 1 tablet (20 mg total) by mouth daily as needed for fluid. Weight every morning before given 03/15/21   Antonieta Iba, MD  gabapentin (NEURONTIN) 100 MG capsule TAKE 1 CAPSULE AT BEDTIME 10/08/21   [name], MD  levothyroxine (SYNTHROID) 50 MCG tablet TAKE 1 TABLET EVERY DAY BEFORE BREAKFAST 06/21/21   [name], MD  Melatonin 5 MG CHEW Chew 10 mg by mouth daily. 06/02/21   [name], MD  mirabegron ER (MYRBETRIQ) 50 MG TB24 tablet Take 1 tablet (50 mg total) by mouth daily. 12/03/20   Sondra Come, MD  Multiple Vitamins-Minerals (MULTIVITAMIN PO) Take 1 tablet by mouth daily. Gummie    [provider]  nystatin cream (MYCOSTATIN) Apply 1 application topically 2 (two) times daily. 10/29/21   [name], MD  OXYGEN Inhale 2 L/min into the lungs See admin instructions. Inhale 2 L/min of oxygen at bedtime and during the day as needed for shortness of breath    [provider]  Potassium Chloride ER 20 MEQ TBCR Take 1 tablet by mouth 2 (two) times daily as needed (with lasix use). 06/21/21   [name], MD  sertraline (ZOLOFT) 100 MG tablet TAKE 1 TABLET EVERY DAY 09/28/21   [name], MD  traMADol (ULTRAM) 50 MG tablet Take 1 tablet (50 mg total) by mouth 2 (two) times daily as needed for moderate pain. 06/02/21   [name], MD  vitamin C (ASCORBIC ACID) 500 MG tablet Take 500 mg by mouth daily.    [provider]    Physical Exam: Vitals:   12/09/21 1830 12/09/21 1845 12/09/21 1954  BP: (!) 146/84 (!) 106/93 (!) 136/54  Pulse: 88 80 79  Resp: 17 18 (!) 22  SpO2: 90% 97% 95%    Physical Exam Vitals and nursing note  reviewed.  Constitutional:      General: She is not in acute distress.    Appearance: She is obese. She is not toxic-appearing or diaphoretic.     Comments: Chronically ill-appearing 76 year old female  HENT:     Head: Normocephalic and atraumatic.     Nose: Nose normal. No  rhinorrhea.  Cardiovascular:     Rate and Rhythm: Normal rate and regular rhythm.  Pulmonary:     Effort: Pulmonary effort is normal. No respiratory distress.     Breath sounds: No wheezing or rales.  Abdominal:     General: Abdomen is protuberant. Bowel sounds are normal. There is no distension.     Tenderness: There is no abdominal tenderness. There is no guarding.  Musculoskeletal:     Comments: Right lower extremity externally rotated and shortened.  Proximal thigh with swelling.  Skin:    General: Skin is warm and dry.     Capillary Refill: Capillary refill takes less than 2 seconds.     Comments: Bilateral groin intertrigo.  Neurological:     General: No focal deficit present.     Mental Status: She is alert and oriented to person, place, and time.     Labs on Admission: I have personally reviewed following labs and imaging studies  CBC: Recent Labs  Lab 12/09/21 1830  WBC 10.9*  NEUTROABS 7.0  HGB 12.4  HCT 43.2  MCV 105.6*  PLT 186   Basic Metabolic Panel: Recent Labs  Lab 12/09/21 1830  NA 138  K 4.4  CL 103  CO2 24  GLUCOSE 123*  BUN 25*  CREATININE 1.03*  CALCIUM 8.7*   GFR: CrCl cannot be calculated (Unknown ideal weight.). Liver Function Tests: Recent Labs  Lab 12/09/21 1830  AST 30  ALT 13  ALKPHOS 120  BILITOT 0.8  PROT 7.2  ALBUMIN 3.9   No results for input(s): LIPASE, AMYLASE in the last 168 hours. No results for input(s): AMMONIA in the last 168 hours. Coagulation Profile: No results for input(s): INR, PROTIME in the last 168 hours. Cardiac Enzymes: No results for input(s): CKTOTAL, CKMB, CKMBINDEX, TROPONINI, TROPONINIHS in the last 168 hours. BNP (last 3 results) No results for input(s): PROBNP in the last 8760 hours. HbA1C: No results for input(s): HGBA1C in the last 72 hours. CBG: No results for input(s): GLUCAP in the last 168 hours. Lipid Profile: No results for input(s): CHOL, HDL, LDLCALC, TRIG, CHOLHDL, LDLDIRECT in  the last 72 hours. Thyroid Function Tests: No results for input(s): TSH, T4TOTAL, FREET4, T3FREE, THYROIDAB in the last 72 hours. Anemia Panel: No results for input(s): VITAMINB12, FOLATE, FERRITIN, TIBC, IRON, RETICCTPCT in the last 72 hours. Urine analysis:    Component Value Date/Time   COLORURINE YELLOW (A) 02/18/2020 1223   APPEARANCEUR Cloudy (A) 09/16/2021 1345   LABSPEC 1.024 02/18/2020 1223   PHURINE 6.0 02/18/2020 1223   GLUCOSEU Negative 09/16/2021 1345   HGBUR NEGATIVE 02/18/2020 1223   BILIRUBINUR Negative 09/16/2021 1345   KETONESUR NEGATIVE 02/18/2020 1223   PROTEINUR 2+ (A) 09/16/2021 1345   PROTEINUR NEGATIVE 02/18/2020 1223   UROBILINOGEN 0.2 06/10/2019 1606   UROBILINOGEN 0.2 08/02/2015 1200   NITRITE Negative 09/16/2021 1345   NITRITE NEGATIVE 02/18/2020 1223   LEUKOCYTESUR 1+ (A) 09/16/2021 1345   LEUKOCYTESUR MODERATE (A) 02/18/2020 1223    Radiological Exams on Admission: I have personally reviewed images CT Head Wo Contrast  Result Date: 12/09/2021 CLINICAL DATA:  Status post trauma. EXAM: CT HEAD WITHOUT CONTRAST TECHNIQUE:  Contiguous axial images were obtained from the base of the skull through the vertex without intravenous contrast. RADIATION DOSE REDUCTION: This exam was performed according to the departmental dose-optimization program which includes automated exposure control, adjustment of the mA and/or kV according to patient size and/or use of iterative reconstruction technique. COMPARISON:  December 24, 2019 FINDINGS: Brain: There is moderate to marked severity cerebral atrophy with widening of the extra-axial spaces and ventricular dilatation. There are areas of decreased attenuation within the white matter tracts of the supratentorial brain, consistent with microvascular disease changes. Vascular: No hyperdense vessel or unexpected calcification. Skull: Normal. Negative for fracture or focal lesion. Sinuses/Orbits: No acute finding. Other: None.  IMPRESSION: 1. No acute intracranial abnormality. 2. Generalized cerebral atrophy. Electronically Signed   By: Aram Candela M.D.   On: 2021-12-14 19:37   CT Cervical Spine Wo Contrast  Result Date: Dec 14, 2021 CLINICAL DATA:  Fall. EXAM: CT CERVICAL SPINE WITHOUT CONTRAST TECHNIQUE: Multidetector CT imaging of the cervical spine was performed without intravenous contrast. Multiplanar CT image reconstructions were also generated. RADIATION DOSE REDUCTION: This exam was performed according to the departmental dose-optimization program which includes automated exposure control, adjustment of the mA and/or kV according to patient size and/or use of iterative reconstruction technique. COMPARISON:  Right shoulder x-ray 14-Dec-2021. FINDINGS: Alignment: Normal. Skull base and vertebrae: No acute fracture. No primary bone lesion or focal pathologic process. Bones are osteopenic. Soft tissues and spinal canal: No prevertebral fluid or swelling. No visible canal hematoma. Disc levels: Mild degenerative changes are seen at C5-C6 and C6-C7. No significant central canal or neural foraminal stenosis at any level. Upper chest: Negative. Other: There is soft tissue swelling and hematoma surrounding the visualized medial right clavicle. IMPRESSION: 1. No acute fracture or traumatic subluxation of the cervical spine. 2. Soft tissue swelling there is hematoma surrounding the medial right clavicle. Electronically Signed   By: Darliss Cheney M.D.   On: 12/14/2021 19:13   DG Pelvis Portable  Result Date: 12/14/2021 CLINICAL DATA:  Status post fall. EXAM: PORTABLE PELVIS 1-2 VIEWS COMPARISON:  None. FINDINGS: An acute, comminuted fracture deformity is seen extending through the inter trochanteric region of the proximal right femur. There is no evidence of dislocation. Radiopaque surgical screws are seen within the proximal left femur. No pelvic bone lesions are seen. IMPRESSION: Acute intertrochanteric fracture of the proximal  right femur. Electronically Signed   By: Aram Candela M.D.   On: December 14, 2021 19:12   DG Chest Portable 1 View  Result Date: 14-Dec-2021 CLINICAL DATA:  Right shoulder pain.  Fall. EXAM: PORTABLE CHEST 1 VIEW COMPARISON:  Chest x-ray 09/14/2021 FINDINGS: The aorta is tortuous with atherosclerotic calcifications, unchanged. Heart size is within normal limits. The lungs are clear. There is no pleural effusion or pneumothorax. The bones are diffusely osteopenic. There is questionable asymmetric inferior displacement of the proximal right clavicle. No acute fractures are seen. Vertebroplasty changes again noted in the lower thoracic region. IMPRESSION: 1. Findings concerning for dislocation at the right sternoclavicular joint. 2. Lungs are clear. Electronically Signed   By: Darliss Cheney M.D.   On: Dec 14, 2021 18:59   DG Shoulder Right Portable  Result Date: 12-14-21 CLINICAL DATA:  Pain. EXAM: RIGHT SHOULDER - 1 VIEW COMPARISON:  None. FINDINGS: The bones are osteopenic. There is a displaced fracture of the medial right clavicle. There is 1 shaft with inferior displacement of the distal fracture fragment. There is no shoulder dislocation. IMPRESSION: 1. Displaced medial right clavicular fracture. Electronically Signed  By: Darliss Cheney M.D.   On: Dec 17, 2021 19:00    EKG: I have personally reviewed EKG: no EKG  Assessment/Plan Principal Problem:   Closed intertrochanteric fracture of hip, right, initial encounter [facility]) Active Problems:   Right clavicle fracture   Hypertension   Peripheral vascular disease (HCC)   COPD (chronic obstructive pulmonary disease) (HCC)   MDD (major depressive disorder), recurrent episode, moderate (HCC)   Osteoporosis   Hypothyroidism (acquired)   Chronic diastolic heart failure (HCC)   Intertrigo   Stage 3a chronic kidney disease (CKD) (HCC)   Neuropathy    Assessment and Plan: * Closed intertrochanteric fracture of hip, right, initial encounter  (HCC) Admit to med/surg bed. Orthopedics has been consulted by EDP(Hewitt with [facility]). NPO after MN. EDP spoke with ortho. Relayed that ortho wants pt to be admitted to [facility]. Scheduled tylenol and ultram for pain. Pt takes ultram at home. dtr patty states that oxycodone causes profound drop in pt's blood pressure in the past. Defer DVT prophylaxis to orthopedic service.  Right clavicle fracture Management per ortho. Right arm sling.  Neuropathy Stable.  Stage 3a chronic kidney disease (CKD) (HCC) Stable.  Intertrigo Nystatin powder to groin. Needs better personal hygiene.  Chronic diastolic heart failure (HCC) Stable. On lasix. Euvolemic.  Hypothyroidism (acquired) Continue synthroid.  Osteoporosis On fosamax at home.  MDD (major depressive disorder), recurrent episode, moderate (HCC) Continue zoloft  COPD (chronic obstructive pulmonary disease) (HCC) Stable.  Peripheral vascular disease (HCC) Continue ASA.  Hypertension Stable. Continue home meds.   DVT prophylaxis: SCDs Code Status: Full Code Family Communication: discussed with pt and dtr patty  Disposition Plan: return home vs SNF. Pt lives at home with dtr [name]  Consults called: EDP has consulted Dr. Garey Ham with [facility]  Admission status: Inpatient, Med-Surg   Carollee Herter, DO [facility] 12/17/2021, 8:55 PM

## 2021-12-09 NOTE — Assessment & Plan Note (Addendum)
Stable.  Continue home meds. ?

## 2021-12-09 NOTE — ED Provider Notes (Signed)
?[facility] ?Provider Note ? ? ?CSN: [id number] ?Arrival date & time: 12/01/2021  1814 ? ?  ? ?History ?Chief Complaint  ?Patient presents with  ? Fall  ? ? ?[name] [name] is a 76 y.o. female w/ morbid obesity, hypertension, peripheral vascular disease, COPD, MDD< osteoporosis, hypothyroidism, chronic diastolic heart failure, neuropathy, CKD stage III presenting for R hip and shoulder pain s/p fall. Pt tripped over her dogs and fell onto her back and right side. She did hit her head. She remembers the entire event. EMS reports right leg is shortened. Pt can wiggle her toes and feel her feet. Cannot lift up her R arm far.  ? ? ?Fall ? ? ?  ? ?Home Medications ?Prior to Admission medications   ?Medication Sig Start Date End Date Taking? Authorizing Provider  ?acetaminophen (TYLENOL) 500 MG tablet Take 1 tablet (500 mg total) by mouth every 6 (six) hours as needed for headache. 01/01/19   Ria Bush, MD  ?albuterol (VENTOLIN HFA) 108 (90 Base) MCG/ACT inhaler INHALE 2 PUFFS INTO THE LUNGS EVERY 6 (SIX) HOURS AS NEEDED FOR WHEEZING OR SHORTNESS OF BREATH. 02/03/20   Ria Bush, MD  ?alendronate (FOSAMAX) 70 MG tablet TAKE 1 TABLET EVERY WEEK WITH A FULL GLASS OF WATER ON AN EMPTY STOMACH. 10/13/21   Ria Bush, MD  ?aspirin (ASPIRIN 81) 81 MG EC tablet Take 1 tablet (81 mg total) by mouth daily. Swallow whole. 12/15/18   Ria Bush, MD  ?atorvastatin (LIPITOR) 40 MG tablet TAKE 1 TABLET EVERY DAY 12/03/21   Ria Bush, MD  ?Cholecalciferol (VITAMIN D) 50 MCG (2000 UT) tablet Take 2,000 Units by mouth daily.    [provider]  ?clopidogrel (PLAVIX) 75 MG tablet TAKE 1 TABLET (75 MG TOTAL) BY MOUTH DAILY. 06/21/21   Ria Bush, MD  ?Cranberry-Vitamin C (AZO CRANBERRY URINARY TRACT PO) Take 1 tablet by mouth in the morning and at bedtime.    [provider]  ?estradiol (ESTRACE) 0.1 MG/GM vaginal cream Estrogen Cream Instruction  Discard applicator Apply pea sized amount to tip of finger to urethra before bed. Wash hands well after application. Use Monday, Wednesday and Friday 11/06/20   Billey Co, MD  ?ezetimibe (ZETIA) 10 MG tablet Take 1 tablet (10 mg total) by mouth daily. 09/28/21   Minna Merritts, MD  ?fluconazole (DIFLUCAN) 150 MG tablet Take 1 tablet (150 mg total) by mouth once a week. 10/29/21   Ria Bush, MD  ?fluticasone-salmeterol (ADVAIR HFA) [phone] MCG/ACT inhaler Inhale 2 puffs into the lungs 2 (two) times daily. 06/02/21   Ria Bush, MD  ?furosemide (LASIX) 20 MG tablet Take 1 tablet (20 mg total) by mouth daily as needed for fluid. Weight every morning before given 03/15/21   Minna Merritts, MD  ?gabapentin (NEURONTIN) 100 MG capsule TAKE 1 CAPSULE AT BEDTIME 10/08/21   Ria Bush, MD  ?levothyroxine (SYNTHROID) 50 MCG tablet TAKE 1 TABLET EVERY DAY BEFORE BREAKFAST 06/21/21   Ria Bush, MD  ?Melatonin 5 MG CHEW Chew 10 mg by mouth daily. 06/02/21   Ria Bush, MD  ?mirabegron ER (MYRBETRIQ) 50 MG TB24 tablet Take 1 tablet (50 mg total) by mouth daily. 12/03/20   Billey Co, MD  ?Multiple Vitamins-Minerals (MULTIVITAMIN PO) Take 1 tablet by mouth daily. Gummie    [provider]  ?nystatin cream (MYCOSTATIN) Apply 1 application topically 2 (two) times daily. 10/29/21   Ria Bush, MD  ?OXYGEN Inhale 2 L/min into  the lungs See admin instructions. Inhale 2 L/min of oxygen at bedtime and during the day as needed for shortness of breath    [provider]  ?Potassium Chloride ER 20 MEQ TBCR Take 1 tablet by mouth 2 (two) times daily as needed (with lasix use). 06/21/21   Ria Bush, MD  ?sertraline (ZOLOFT) 100 MG tablet TAKE 1 TABLET EVERY DAY 09/28/21   Ria Bush, MD  ?traMADol (ULTRAM) 50 MG tablet Take 1 tablet (50 mg total) by mouth 2 (two) times daily as needed for moderate pain. 06/02/21   Ria Bush, MD  ?vitamin C (ASCORBIC ACID) 500 MG  tablet Take 500 mg by mouth daily.    [provider]  ?   ? ?Allergies    ?Doxycycline and Oxycodone   ? ?Review of Systems   ?Review of Systems  ?Musculoskeletal:  Positive for arthralgias, gait problem, joint swelling and myalgias. Negative for neck pain and neck stiffness.  ? ?Physical Exam ?Updated Vital Signs ?BP 121/61 (BP Location: Left Arm)   Pulse 85   Temp 98.4 ?F (36.9 ?C) (Oral)   Resp 15   Ht 4\' 9"  (1.448 m)   Wt 85.3 kg   SpO2 93%   BMI 40.69 kg/m?  ?Physical Exam ?Vitals and nursing note reviewed.  ?Constitutional:   ?   Appearance: Normal appearance.  ?HENT:  ?   Head: Normocephalic and atraumatic.  ?Eyes:  ?   Extraocular Movements: Extraocular movements intact.  ?   Pupils: Pupils are equal, round, and reactive to light.  ?Neck:  ?   Comments: C-collar placed ?Cardiovascular:  ?   Rate and Rhythm: Normal rate and regular rhythm.  ?Abdominal:  ?   Tenderness: There is no abdominal tenderness. There is no guarding or rebound.  ?Musculoskeletal:     ?   General: Swelling, tenderness, deformity and signs of injury present.  ?   Cervical back: Normal range of motion. No rigidity or tenderness.  ?   Comments: R Hip ttp, RLE shortened and external rotated.  ?Deformity to R clavicle and inability to raise RUE  ?No T or L spinal tenderness  ?Skin: ?   General: Skin is warm and dry.  ?Neurological:  ?   General: No focal deficit present.  ?   Mental Status: She is alert and oriented to person, place, and time.  ?Psychiatric:     ?   Mood and Affect: Mood normal.     ?   Behavior: Behavior normal.  ? ? ?ED Results / Procedures / Treatments   ?Labs ?(all labs ordered are listed, but only abnormal results are displayed) ?Labs Reviewed  ?CBC WITH DIFFERENTIAL/PLATELET - Abnormal; Notable for the following components:  ?    Result Value  ? WBC 10.9 (*)   ? MCV 105.6 (*)   ? MCHC 28.7 (*)   ? Abs Immature Granulocytes 0.12 (*)   ? All other components within normal limits  ?COMPREHENSIVE METABOLIC  PANEL - Abnormal; Notable for the following components:  ? Glucose, Bld 123 (*)   ? BUN 25 (*)   ? Creatinine, Ser 1.03 (*)   ? Calcium 8.7 (*)   ? GFR, Estimated 57 (*)   ? All other components within normal limits  ?CBC WITH DIFFERENTIAL/PLATELET - Abnormal; Notable for the following components:  ? WBC 10.7 (*)   ? RBC 3.28 (*)   ? Hemoglobin 9.8 (*)   ? HCT 31.9 (*)   ? Neutro Abs 8.8 (*)   ?  All other components within normal limits  ?COMPREHENSIVE METABOLIC PANEL - Abnormal; Notable for the following components:  ? Glucose, Bld 157 (*)   ? Calcium 8.3 (*)   ? Total Protein 6.4 (*)   ? Albumin 3.4 (*)   ? GFR, Estimated 59 (*)   ? All other components within normal limits  ?MRSA NEXT GEN BY PCR, NASAL  ?MAGNESIUM  ?TYPE AND SCREEN  ? ? ?EKG ?None ? ?Radiology ?CT Head Wo Contrast ? ?Result Date: 12/15/2021 ?CLINICAL DATA:  Status post trauma. EXAM: CT HEAD WITHOUT CONTRAST TECHNIQUE: Contiguous axial images were obtained from the base of the skull through the vertex without intravenous contrast. RADIATION DOSE REDUCTION: This exam was performed according to the departmental dose-optimization program which includes automated exposure control, adjustment of the mA and/or kV according to patient size and/or use of iterative reconstruction technique. COMPARISON:  December 24, 2019 FINDINGS: Brain: There is moderate to marked severity cerebral atrophy with widening of the extra-axial spaces and ventricular dilatation. There are areas of decreased attenuation within the white matter tracts of the supratentorial brain, consistent with microvascular disease changes. Vascular: No hyperdense vessel or unexpected calcification. Skull: Normal. Negative for fracture or focal lesion. Sinuses/Orbits: No acute finding. Other: None. IMPRESSION: 1. No acute intracranial abnormality. 2. Generalized cerebral atrophy. Electronically Signed   By: Virgina Norfolk M.D.   On: 12/17/2021 19:37  ? ?CT Cervical Spine Wo Contrast ? ?Result  Date: 12/19/2021 ?CLINICAL DATA:  Fall. EXAM: CT CERVICAL SPINE WITHOUT CONTRAST TECHNIQUE: Multidetector CT imaging of the cervical spine was performed without intravenous contrast. Multiplanar CT image recon

## 2021-12-10 ENCOUNTER — Encounter (HOSPITAL_COMMUNITY): Admission: EM | Disposition: E | Payer: Self-pay | Source: Home / Self Care | Attending: Internal Medicine

## 2021-12-10 ENCOUNTER — Inpatient Hospital Stay (HOSPITAL_COMMUNITY): Payer: Medicare HMO | Admitting: Anesthesiology

## 2021-12-10 ENCOUNTER — Inpatient Hospital Stay (HOSPITAL_COMMUNITY): Payer: Medicare HMO

## 2021-12-10 ENCOUNTER — Ambulatory Visit: Payer: Medicare HMO | Admitting: Family Medicine

## 2021-12-10 ENCOUNTER — Other Ambulatory Visit: Payer: Self-pay

## 2021-12-10 ENCOUNTER — Encounter (HOSPITAL_COMMUNITY): Payer: Self-pay | Admitting: Internal Medicine

## 2021-12-10 DIAGNOSIS — S72141A Displaced intertrochanteric fracture of right femur, initial encounter for closed fracture: Secondary | ICD-10-CM

## 2021-12-10 DIAGNOSIS — N1831 Chronic kidney disease, stage 3a: Secondary | ICD-10-CM

## 2021-12-10 DIAGNOSIS — I5032 Chronic diastolic (congestive) heart failure: Secondary | ICD-10-CM

## 2021-12-10 DIAGNOSIS — I130 Hypertensive heart and chronic kidney disease with heart failure and stage 1 through stage 4 chronic kidney disease, or unspecified chronic kidney disease: Secondary | ICD-10-CM

## 2021-12-10 DIAGNOSIS — J449 Chronic obstructive pulmonary disease, unspecified: Secondary | ICD-10-CM

## 2021-12-10 HISTORY — PX: INTRAMEDULLARY (IM) NAIL INTERTROCHANTERIC: SHX5875

## 2021-12-10 LAB — COMPREHENSIVE METABOLIC PANEL
ALT: 15 U/L (ref 0–44)
AST: 20 U/L (ref 15–41)
Albumin: 3.4 g/dL — ABNORMAL LOW (ref 3.5–5.0)
Alkaline Phosphatase: 99 U/L (ref 38–126)
Anion gap: 6 (ref 5–15)
BUN: 22 mg/dL (ref 8–23)
CO2: 28 mmol/L (ref 22–32)
Calcium: 8.3 mg/dL — ABNORMAL LOW (ref 8.9–10.3)
Chloride: 104 mmol/L (ref 98–111)
Creatinine, Ser: 1 mg/dL (ref 0.44–1.00)
GFR, Estimated: 59 mL/min — ABNORMAL LOW (ref >60)
Glucose, Bld: 157 mg/dL — ABNORMAL HIGH (ref 70–99)
Potassium: 4.8 mmol/L (ref 3.5–5.1)
Sodium: 138 mmol/L (ref 135–145)
Total Bilirubin: 0.5 mg/dL (ref 0.3–1.2)
Total Protein: 6.4 g/dL — ABNORMAL LOW (ref 6.5–8.1)

## 2021-12-10 LAB — CBC WITH DIFFERENTIAL/PLATELET
Abs Immature Granulocytes: 0.06 10*3/uL (ref 0.00–0.07)
Basophils Absolute: 0.1 10*3/uL (ref 0.0–0.1)
Basophils Relative: 1 %
Eosinophils Absolute: 0 10*3/uL (ref 0.0–0.5)
Eosinophils Relative: 0 %
HCT: 31.9 % — ABNORMAL LOW (ref 36.0–46.0)
Hemoglobin: 9.8 g/dL — ABNORMAL LOW (ref 12.0–15.0)
Immature Granulocytes: 1 %
Lymphocytes Relative: 10 %
Lymphs Abs: 1.1 10*3/uL (ref 0.7–4.0)
MCH: 29.9 pg (ref 26.0–34.0)
MCHC: 30.7 g/dL (ref 30.0–36.0)
MCV: 97.3 fL (ref 80.0–100.0)
Monocytes Absolute: 0.8 10*3/uL (ref 0.1–1.0)
Monocytes Relative: 7 %
Neutro Abs: 8.8 10*3/uL — ABNORMAL HIGH (ref 1.7–7.7)
Neutrophils Relative %: 81 %
Platelets: 172 10*3/uL (ref 150–400)
RBC: 3.28 MIL/uL — ABNORMAL LOW (ref 3.87–5.11)
RDW: 13.3 % (ref 11.5–15.5)
WBC: 10.7 10*3/uL — ABNORMAL HIGH (ref 4.0–10.5)
nRBC: 0 % (ref 0.0–0.2)

## 2021-12-10 LAB — MRSA NEXT GEN BY PCR, NASAL: MRSA by PCR Next Gen: NOT DETECTED

## 2021-12-10 LAB — MAGNESIUM: Magnesium: 1.9 mg/dL (ref 1.7–2.4)

## 2021-12-10 SURGERY — FIXATION, FRACTURE, INTERTROCHANTERIC, WITH INTRAMEDULLARY ROD
Anesthesia: General | Laterality: Right

## 2021-12-10 MED ORDER — ONDANSETRON HCL 4 MG/2ML IJ SOLN
INTRAMUSCULAR | Status: DC | PRN
  Administered 2021-12-10: 4 mg via INTRAVENOUS

## 2021-12-10 MED ORDER — LIDOCAINE 2% (20 MG/ML) 5 ML SYRINGE
INTRAMUSCULAR | Status: AC
  Filled 2021-12-10: qty 5

## 2021-12-10 MED ORDER — ONDANSETRON HCL 4 MG PO TABS: 4.0000 mg | ORAL_TABLET | Freq: Four times a day (QID) | ORAL | Status: DC | PRN

## 2021-12-10 MED ORDER — CLONIDINE HCL (ANALGESIA) 100 MCG/ML EP SOLN
EPIDURAL | Status: DC | PRN
  Administered 2021-12-10: 50 ug

## 2021-12-10 MED ORDER — TRANEXAMIC ACID-NACL 1000-0.7 MG/100ML-% IV SOLN
1000.0000 mg | Freq: Once | INTRAVENOUS | Status: AC
  Administered 2021-12-11: 1000 mg via INTRAVENOUS
  Filled 2021-12-10: qty 100

## 2021-12-10 MED ORDER — DEXAMETHASONE SODIUM PHOSPHATE 10 MG/ML IJ SOLN
INTRAMUSCULAR | Status: DC | PRN
Start: 2021-12-10 — End: 2021-12-10
  Administered 2021-12-10: 10 mg via INTRAVENOUS

## 2021-12-10 MED ORDER — PHENOL 1.4 % MT LIQD: 1.0000 | OROMUCOSAL | Status: DC | PRN

## 2021-12-10 MED ORDER — METHOCARBAMOL 1000 MG/10ML IJ SOLN
500.0000 mg | Freq: Four times a day (QID) | INTRAVENOUS | Status: DC | PRN
  Filled 2021-12-10: qty 5

## 2021-12-10 MED ORDER — ASPIRIN 81 MG PO CHEW
81.0000 mg | CHEWABLE_TABLET | Freq: Two times a day (BID) | ORAL | Status: DC
  Administered 2021-12-11 – 2021-12-12 (×3): 81 mg via ORAL
  Filled 2021-12-10 (×3): qty 1

## 2021-12-10 MED ORDER — CHLORHEXIDINE GLUCONATE 0.12 % MT SOLN: 15.0000 mL | Freq: Once | OROMUCOSAL | Status: AC

## 2021-12-10 MED ORDER — PROPOFOL 10 MG/ML IV BOLUS
INTRAVENOUS | Status: AC
  Filled 2021-12-10: qty 20

## 2021-12-10 MED ORDER — ROCURONIUM BROMIDE 10 MG/ML (PF) SYRINGE
PREFILLED_SYRINGE | INTRAVENOUS | Status: DC | PRN
  Administered 2021-12-10: 20 mg via INTRAVENOUS
  Administered 2021-12-10: 50 mg via INTRAVENOUS

## 2021-12-10 MED ORDER — ACETAMINOPHEN 500 MG PO TABS
1000.0000 mg | ORAL_TABLET | Freq: Four times a day (QID) | ORAL | Status: DC | PRN
  Administered 2021-12-14 – 2021-12-15 (×3): 1000 mg via ORAL
  Filled 2021-12-10 (×3): qty 2

## 2021-12-10 MED ORDER — ALBUMIN HUMAN 5 % IV SOLN
INTRAVENOUS | Status: AC
  Filled 2021-12-10: qty 250

## 2021-12-10 MED ORDER — FENTANYL CITRATE (PF) 100 MCG/2ML IJ SOLN
INTRAMUSCULAR | Status: AC
  Filled 2021-12-10: qty 2

## 2021-12-10 MED ORDER — CEFAZOLIN SODIUM-DEXTROSE 2-4 GM/100ML-% IV SOLN
2.0000 g | Freq: Four times a day (QID) | INTRAVENOUS | Status: AC
  Administered 2021-12-10 – 2021-12-11 (×2): 2 g via INTRAVENOUS
  Filled 2021-12-10 (×2): qty 100

## 2021-12-10 MED ORDER — LIDOCAINE 2% (20 MG/ML) 5 ML SYRINGE
INTRAMUSCULAR | Status: DC | PRN
  Administered 2021-12-10: 60 mg via INTRAVENOUS

## 2021-12-10 MED ORDER — FENTANYL CITRATE (PF) 250 MCG/5ML IJ SOLN
INTRAMUSCULAR | Status: DC | PRN
  Administered 2021-12-10: 100 ug via INTRAVENOUS
  Administered 2021-12-10: 50 ug via INTRAVENOUS

## 2021-12-10 MED ORDER — CHLORHEXIDINE GLUCONATE 0.12 % MT SOLN
OROMUCOSAL | Status: AC
  Administered 2021-12-10: 15 mL via OROMUCOSAL
  Filled 2021-12-10: qty 15

## 2021-12-10 MED ORDER — PROPOFOL 10 MG/ML IV BOLUS
INTRAVENOUS | Status: DC | PRN
  Administered 2021-12-10: 100 mg via INTRAVENOUS

## 2021-12-10 MED ORDER — BISACODYL 10 MG RE SUPP: 10.0000 mg | Freq: Every day | RECTAL | Status: DC | PRN

## 2021-12-10 MED ORDER — METOCLOPRAMIDE HCL 5 MG/ML IJ SOLN: 5.0000 mg | Freq: Three times a day (TID) | INTRAMUSCULAR | Status: DC | PRN

## 2021-12-10 MED ORDER — ADULT MULTIVITAMIN W/MINERALS CH
1.0000 | ORAL_TABLET | Freq: Every day | ORAL | Status: DC
  Administered 2021-12-11 – 2021-12-20 (×10): 1 via ORAL
  Filled 2021-12-10 (×10): qty 1

## 2021-12-10 MED ORDER — DOCUSATE SODIUM 100 MG PO CAPS
100.0000 mg | ORAL_CAPSULE | Freq: Two times a day (BID) | ORAL | Status: DC
  Administered 2021-12-10 – 2021-12-18 (×14): 100 mg via ORAL
  Filled 2021-12-10 (×18): qty 1

## 2021-12-10 MED ORDER — ONDANSETRON HCL 4 MG/2ML IJ SOLN: 4.0000 mg | Freq: Once | INTRAMUSCULAR | Status: DC | PRN

## 2021-12-10 MED ORDER — DEXMEDETOMIDINE (PRECEDEX) IN NS 20 MCG/5ML (4 MCG/ML) IV SYRINGE
PREFILLED_SYRINGE | INTRAVENOUS | Status: AC
  Filled 2021-12-10: qty 5

## 2021-12-10 MED ORDER — FLUCONAZOLE 150 MG PO TABS
150.0000 mg | ORAL_TABLET | Freq: Every day | ORAL | Status: AC
Start: 2021-12-11 — End: 2021-12-12
  Administered 2021-12-11 – 2021-12-12 (×2): 150 mg via ORAL
  Filled 2021-12-10 (×2): qty 1

## 2021-12-10 MED ORDER — METHOCARBAMOL 500 MG PO TABS
500.0000 mg | ORAL_TABLET | Freq: Four times a day (QID) | ORAL | Status: DC | PRN
  Administered 2021-12-11 – 2021-12-15 (×8): 500 mg via ORAL
  Filled 2021-12-10 (×8): qty 1

## 2021-12-10 MED ORDER — LACTATED RINGERS IV SOLN: INTRAVENOUS | Status: DC

## 2021-12-10 MED ORDER — ACETAMINOPHEN 10 MG/ML IV SOLN
INTRAVENOUS | Status: AC
  Filled 2021-12-10: qty 100

## 2021-12-10 MED ORDER — FENTANYL CITRATE PF 50 MCG/ML IJ SOSY
12.5000 ug | PREFILLED_SYRINGE | INTRAMUSCULAR | Status: DC | PRN
  Administered 2021-12-10 (×2): 12.5 ug via INTRAVENOUS
  Administered 2021-12-10: 25 ug via INTRAVENOUS
  Filled 2021-12-10 (×4): qty 1

## 2021-12-10 MED ORDER — FENTANYL CITRATE (PF) 100 MCG/2ML IJ SOLN
25.0000 ug | INTRAMUSCULAR | Status: DC | PRN
  Administered 2021-12-10: 25 ug via INTRAVENOUS

## 2021-12-10 MED ORDER — ONDANSETRON HCL 4 MG/2ML IJ SOLN
INTRAMUSCULAR | Status: AC
  Filled 2021-12-10: qty 2

## 2021-12-10 MED ORDER — 0.9 % SODIUM CHLORIDE (POUR BTL) OPTIME
TOPICAL | Status: DC | PRN
  Administered 2021-12-10: 1000 mL

## 2021-12-10 MED ORDER — FENTANYL CITRATE (PF) 250 MCG/5ML IJ SOLN
INTRAMUSCULAR | Status: AC
  Filled 2021-12-10: qty 5

## 2021-12-10 MED ORDER — EPHEDRINE SULFATE-NACL 50-0.9 MG/10ML-% IV SOSY
PREFILLED_SYRINGE | INTRAVENOUS | Status: DC | PRN
  Administered 2021-12-10: 10 mg via INTRAVENOUS
  Administered 2021-12-10: 5 mg via INTRAVENOUS
  Administered 2021-12-10: 10 mg via INTRAVENOUS

## 2021-12-10 MED ORDER — ENSURE ENLIVE PO LIQD
237.0000 mL | Freq: Two times a day (BID) | ORAL | Status: DC
  Administered 2021-12-11 – 2021-12-19 (×15): 237 mL via ORAL
  Filled 2021-12-10: qty 474

## 2021-12-10 MED ORDER — SUGAMMADEX SODIUM 200 MG/2ML IV SOLN
INTRAVENOUS | Status: DC | PRN
  Administered 2021-12-10: 200 mg via INTRAVENOUS

## 2021-12-10 MED ORDER — MENTHOL 3 MG MT LOZG
1.0000 | LOZENGE | OROMUCOSAL | Status: DC | PRN
  Filled 2021-12-10: qty 9

## 2021-12-10 MED ORDER — ROCURONIUM BROMIDE 10 MG/ML (PF) SYRINGE
PREFILLED_SYRINGE | INTRAVENOUS | Status: AC
  Filled 2021-12-10: qty 10

## 2021-12-10 MED ORDER — ALBUMIN HUMAN 5 % IV SOLN
12.5000 g | Freq: Once | INTRAVENOUS | Status: AC
  Administered 2021-12-10: 12.5 g via INTRAVENOUS

## 2021-12-10 MED ORDER — SENNA 8.6 MG PO TABS
1.0000 | ORAL_TABLET | Freq: Two times a day (BID) | ORAL | Status: DC
  Administered 2021-12-10 – 2021-12-11 (×3): 8.6 mg via ORAL
  Filled 2021-12-10 (×3): qty 1

## 2021-12-10 MED ORDER — CEFAZOLIN SODIUM-DEXTROSE 2-4 GM/100ML-% IV SOLN
INTRAVENOUS | Status: AC
  Filled 2021-12-10: qty 100

## 2021-12-10 MED ORDER — POVIDONE-IODINE 10 % EX SWAB
2.0000 "application " | Freq: Once | CUTANEOUS | Status: AC
  Administered 2021-12-10: 2 via TOPICAL

## 2021-12-10 MED ORDER — ROPIVACAINE HCL 5 MG/ML IJ SOLN
INTRAMUSCULAR | Status: DC | PRN
  Administered 2021-12-10: 25 mL via PERINEURAL

## 2021-12-10 MED ORDER — KETOROLAC TROMETHAMINE 30 MG/ML IJ SOLN
30.0000 mg | Freq: Three times a day (TID) | INTRAMUSCULAR | Status: AC | PRN
  Administered 2021-12-10 – 2021-12-12 (×3): 30 mg via INTRAVENOUS
  Filled 2021-12-10 (×3): qty 1

## 2021-12-10 MED ORDER — EPHEDRINE 5 MG/ML INJ
INTRAVENOUS | Status: AC
  Filled 2021-12-10: qty 10

## 2021-12-10 MED ORDER — PHENYLEPHRINE 40 MCG/ML (10ML) SYRINGE FOR IV PUSH (FOR BLOOD PRESSURE SUPPORT)
PREFILLED_SYRINGE | INTRAVENOUS | Status: DC | PRN
  Administered 2021-12-10: 200 ug via INTRAVENOUS
  Administered 2021-12-10: 80 ug via INTRAVENOUS
  Administered 2021-12-10: 120 ug via INTRAVENOUS

## 2021-12-10 MED ORDER — ACETAMINOPHEN 10 MG/ML IV SOLN
INTRAVENOUS | Status: DC | PRN
  Administered 2021-12-10: 1000 mg via INTRAVENOUS

## 2021-12-10 MED ORDER — DEXAMETHASONE SODIUM PHOSPHATE 10 MG/ML IJ SOLN
INTRAMUSCULAR | Status: AC
  Filled 2021-12-10: qty 1

## 2021-12-10 MED ORDER — CEFAZOLIN SODIUM-DEXTROSE 2-4 GM/100ML-% IV SOLN
2.0000 g | INTRAVENOUS | Status: AC
  Administered 2021-12-10: 2 g via INTRAVENOUS

## 2021-12-10 MED ORDER — POLYETHYLENE GLYCOL 3350 17 G PO PACK: 17.0000 g | PACK | Freq: Every day | ORAL | Status: DC | PRN

## 2021-12-10 MED ORDER — PHENYLEPHRINE HCL-NACL 20-0.9 MG/250ML-% IV SOLN
INTRAVENOUS | Status: DC | PRN
  Administered 2021-12-10: 40 ug/min via INTRAVENOUS

## 2021-12-10 MED ORDER — TRANEXAMIC ACID-NACL 1000-0.7 MG/100ML-% IV SOLN
1000.0000 mg | INTRAVENOUS | Status: AC
  Administered 2021-12-10: 1000 mg via INTRAVENOUS
  Filled 2021-12-10: qty 100

## 2021-12-10 MED ORDER — ONDANSETRON HCL 4 MG/2ML IJ SOLN
4.0000 mg | Freq: Four times a day (QID) | INTRAMUSCULAR | Status: DC | PRN
Start: 2021-12-10 — End: 2021-12-21
  Administered 2021-12-15: 4 mg via INTRAVENOUS
  Filled 2021-12-10: qty 2

## 2021-12-10 MED ORDER — METOCLOPRAMIDE HCL 5 MG PO TABS
5.0000 mg | ORAL_TABLET | Freq: Three times a day (TID) | ORAL | Status: DC | PRN
  Filled 2021-12-10: qty 2

## 2021-12-10 MED ORDER — PHENYLEPHRINE 40 MCG/ML (10ML) SYRINGE FOR IV PUSH (FOR BLOOD PRESSURE SUPPORT)
PREFILLED_SYRINGE | INTRAVENOUS | Status: AC
  Filled 2021-12-10: qty 10

## 2021-12-10 MED ORDER — ORAL CARE MOUTH RINSE: 15.0000 mL | Freq: Once | OROMUCOSAL | Status: AC

## 2021-12-10 SURGICAL SUPPLY — 60 items
ADH SKN CLS APL DERMABOND .7 (GAUZE/BANDAGES/DRESSINGS) ×2
APL PRP STRL LF DISP 70% ISPRP (MISCELLANEOUS) ×1
BAG COUNTER SPONGE SURGICOUNT (BAG) ×2 IMPLANT
BAG SPNG CNTER NS LX DISP (BAG) ×1
BIT DRILL CALIBRTD FREE HND4.3 (BIT) IMPLANT
CHLORAPREP W/TINT 26 (MISCELLANEOUS) ×2 IMPLANT
COVER PERINEAL POST (MISCELLANEOUS) ×2 IMPLANT
COVER SURGICAL LIGHT HANDLE (MISCELLANEOUS) ×2 IMPLANT
DERMABOND ADVANCED (GAUZE/BANDAGES/DRESSINGS) ×2
DERMABOND ADVANCED .7 DNX12 (GAUZE/BANDAGES/DRESSINGS) ×2 IMPLANT
DRAPE C-ARM 42X72 X-RAY (DRAPES) ×2 IMPLANT
DRAPE C-ARMOR (DRAPES) ×2 IMPLANT
DRAPE HALF SHEET 40X57 (DRAPES) ×2 IMPLANT
DRAPE IMP U-DRAPE 54X76 (DRAPES) ×4 IMPLANT
DRAPE STERI IOBAN 125X83 (DRAPES) ×2 IMPLANT
DRAPE U-SHAPE 47X51 STRL (DRAPES) ×4 IMPLANT
DRESSING MEPILEX FLEX 4X4 (GAUZE/BANDAGES/DRESSINGS) IMPLANT
DRILL CALIBRATED FREE HAND 4.3 (BIT) ×2
DRSG MEPILEX BORDER 4X4 (GAUZE/BANDAGES/DRESSINGS) ×2 IMPLANT
DRSG MEPILEX BORDER 4X8 (GAUZE/BANDAGES/DRESSINGS) ×1 IMPLANT
DRSG MEPILEX FLEX 4X4 (GAUZE/BANDAGES/DRESSINGS) ×4
DRSG PAD ABDOMINAL 8X10 ST (GAUZE/BANDAGES/DRESSINGS) IMPLANT
DRSG TEGADERM 4X4.5 CHG (GAUZE/BANDAGES/DRESSINGS) ×4 IMPLANT
ELECT REM PT RETURN 9FT ADLT (ELECTROSURGICAL) ×2
ELECTRODE REM PT RTRN 9FT ADLT (ELECTROSURGICAL) ×1 IMPLANT
FACESHIELD WRAPAROUND (MASK) ×2 IMPLANT
FACESHIELD WRAPAROUND OR TEAM (MASK) ×1 IMPLANT
GLOVE SURG ENC MOIS LTX SZ8.5 (GLOVE) ×4 IMPLANT
GLOVE SURG ENC TEXT LTX SZ7 (GLOVE) ×2 IMPLANT
GLOVE SURG UNDER POLY LF SZ7.5 (GLOVE) ×2 IMPLANT
GLOVE SURG UNDER POLY LF SZ8.5 (GLOVE) ×2 IMPLANT
GOWN STRL REUS W/ TWL LRG LVL3 (GOWN DISPOSABLE) ×2 IMPLANT
GOWN STRL REUS W/ TWL XL LVL3 (GOWN DISPOSABLE) ×1 IMPLANT
GOWN STRL REUS W/TWL 2XL LVL3 (GOWN DISPOSABLE) ×2 IMPLANT
GOWN STRL REUS W/TWL LRG LVL3 (GOWN DISPOSABLE) ×4
GOWN STRL REUS W/TWL XL LVL3 (GOWN DISPOSABLE) ×2
GUIDEPIN VERSANAIL DSP 3.2X444 (ORTHOPEDIC DISPOSABLE SUPPLIES) ×1 IMPLANT
GUIDEWIRE 3.0X100MM BALL TIP (WIRE) ×2 IMPLANT
HFN LAG SCREW 10.5MM X 85MM (Screw) ×1 IMPLANT
HOOD PEEL AWAY FLYTE STAYCOOL (MISCELLANEOUS) ×1 IMPLANT
KIT TURNOVER KIT B (KITS) ×2 IMPLANT
MANIFOLD NEPTUNE II (INSTRUMENTS) ×2 IMPLANT
MARKER SKIN DUAL TIP RULER LAB (MISCELLANEOUS) ×2 IMPLANT
NAIL IM AFFIXUS HIP 9X360 125D (Nail) ×1 IMPLANT
NS IRRIG 1000ML POUR BTL (IV SOLUTION) ×2 IMPLANT
PACK GENERAL/GYN (CUSTOM PROCEDURE TRAY) ×2 IMPLANT
PACK UNIVERSAL I (CUSTOM PROCEDURE TRAY) ×2 IMPLANT
PAD ARMBOARD 7.5X6 YLW CONV (MISCELLANEOUS) ×4 IMPLANT
PADDING CAST ABS 4INX4YD NS (CAST SUPPLIES)
PADDING CAST ABS COTTON 4X4 ST (CAST SUPPLIES) IMPLANT
SCREW BONE CORTICAL 5.0X40 (Screw) ×1 IMPLANT
STAPLER VISISTAT 35W (STAPLE) ×1 IMPLANT
SUT MNCRL AB 3-0 PS2 27 (SUTURE) ×2 IMPLANT
SUT MON AB 2-0 CT1 27 (SUTURE) ×2 IMPLANT
SUT MON AB 2-0 CT1 36 (SUTURE) ×3 IMPLANT
SUT VIC AB 1 CT1 27 (SUTURE) ×2
SUT VIC AB 1 CT1 27XBRD ANBCTR (SUTURE) ×1 IMPLANT
SUT VIC AB 2-0 CT1 (SUTURE) ×1 IMPLANT
TOWEL GREEN STERILE (TOWEL DISPOSABLE) ×2 IMPLANT
TOWEL GREEN STERILE FF (TOWEL DISPOSABLE) ×2 IMPLANT

## 2021-12-10 NOTE — Progress Notes (Signed)
?PROGRESS NOTE ? ?[name] [name] ZOX:[id number] DOB: 10/18/1945 DOA: 11/27/2021 ?PCP: [name], MD ? ? LOS: 1 day  ? ?Brief Narrative / Interim history: ?76 year old female with obesity, HTN, PVD, CKD 3A, comes into the hospital after having a fall at home.  She was walking without a walker although she is supposed to use 1, tripped up on her dogs and fell onto her right side/backside.  She experienced pain in the right hip as well as right shoulder, was brought to the ER and found to have right clavicle fracture as well as right proximal femur fracture.  Ortho consulted.  ? ?Subjective / 24h Interval events: ?Doing well this morning, awaiting surgery.  Pain was poorly controlled overnight ? ?Assesement and Plan: ?Principal Problem: ?  Closed intertrochanteric fracture of hip, right, initial encounter (HCC) ?Active Problems: ?  Right clavicle fracture ?  Hypertension ?  Peripheral vascular disease (HCC) ?  COPD (chronic obstructive pulmonary disease) (HCC) ?  MDD (major depressive disorder), recurrent episode, moderate (HCC) ?  Osteoporosis ?  Hypothyroidism (acquired) ?  Chronic diastolic heart failure (HCC) ?  Intertrigo ?  Stage 3a chronic kidney disease (CKD) (HCC) ?  Neuropathy ? ? ?Assessment and Plan: ?Principal problem ?Closed intertrochanteric fracture of hip, right -orthopedic surgery consulted, appreciate input.  Sling for the right clavicle fracture, will be taken to the OR today for repair of the right femur fracture. ? ?Active problems ?Right clavicle fracture -Management per ortho. Right arm sling. ? ?Stage 3a chronic kidney disease (CKD) (HCC) -Baseline creatinine 0.9-1.2, currently at baseline  ? ?Intertrigo -continue nystatin powder ? ?Chronic diastolic heart failure (HCC) -Stable. On lasix. Euvolemic. ? ?Hypothyroidism-Continue synthroid. ? ?Osteoporosis -On fosamax at home. ? ?Depression-Continue zoloft ? ?Hyperlipidemia-continue statin, Zetia ? ?COPD (chronic obstructive pulmonary  disease) (HCC) -stable, no wheezing, appears at baseline. ? ?Peripheral vascular disease (HCC)- Hold aspirin until postop ? ?Hypertension -Stable. Continue home meds. ? ?Scheduled Meds: ? atorvastatin  40 mg Oral Daily  ? ezetimibe  10 mg Oral QPM  ? levothyroxine  50 mcg Oral Q0600  ? mirabegron ER  50 mg Oral Daily  ? mometasone-formoterol  2 puff Inhalation BID  ? nystatin   Topical BID  ? sertraline  100 mg Oral Daily  ? ?Continuous Infusions: ?PRN Meds:.acetaminophen, fentaNYL (SUBLIMAZE) injection, HYDROcodone-acetaminophen, melatonin, ondansetron (ZOFRAN) IV, traMADol ? ?Diet Orders (From admission, onward)  ? ?  Start     Ordered  ? 02-Jan-2022 0430  Diet NPO time specified  Diet effective ____       ? 12/03/2021 2146  ? ?  ?  ? ?  ? ?DVT prophylaxis: SCDs Start: 11/29/2021 2146 ? ? ?Lab Results  ?Component Value Date  ? PLT 172 01-02-22  ? ? ?  Code Status: Full Code ? ?Family Communication: no family at bedside  ? ?Status is: Inpatient ? ?Remains inpatient appropriate because: surgery today ? ?Level of care: Med-Surg ? ?Consultants:  ?Orthopedic surgery  ? ?Procedures:  ?none ? ?Microbiology  ?none ? ?Antimicrobials: ?none  ? ? ?Objective: ?Vitals:  ? 02-Jan-2022 0906 02-Jan-2022 0921 2022/01/02 0948 01-02-2022 1006  ?BP: (!) 123/94 123/70 140/81 121/61  ?Pulse: 82 80 86 85  ?Resp: 20 18 15 15   ?Temp: 98 ?F (36.7 ?C)  98 ?F (36.7 ?C) 98.4 ?F (36.9 ?C)  ?TempSrc:    Oral  ?SpO2: 100% 100% 100% 93%  ?Weight:      ?Height:      ? ? ?Intake/Output Summary (Last 24  hours) at 12/14/2021 1036 ?Last data filed at 12/04/2021 [id number] ?Gross per 24 hour  ?Intake --  ?Output 200 ml  ?Net -200 ml  ? ?Wt Readings from Last 3 Encounters:  ?12/24/2021 85.3 kg  ?11/16/21 83.9 kg  ?09/28/21 81.8 kg  ? ? ?Examination: ? ?Constitutional: NAD ?Eyes: no scleral icterus ?ENMT: Mucous membranes are moist.  ?Neck: normal, supple ?Respiratory: clear to auscultation bilaterally, no wheezing, no crackles.  ?Cardiovascular: Regular rate and rhythm, no  murmurs / rubs / gallops. No LE edema.  ?Abdomen: non distended, no tenderness. Bowel sounds positive.  ?Musculoskeletal: no clubbing / cyanosis.  ?Skin: no rashes ?Neurologic: nonfocal  ? ? ?Data Reviewed: I have independently reviewed following labs and imaging studies ? ?CBC ?Recent Labs  ?Lab 12/06/2021 ?1830 12/23/2021 ?0308  ?WBC 10.9* 10.7*  ?HGB 12.4 9.8*  ?HCT 43.2 31.9*  ?PLT 186 172  ?MCV 105.6* 97.3  ?MCH 30.3 29.9  ?MCHC 28.7* 30.7  ?RDW 13.1 13.3  ?LYMPHSABS 2.6 1.1  ?MONOABS 0.9 0.8  ?EOSABS 0.3 0.0  ?BASOSABS 0.1 0.1  ? ? ?Recent Labs  ?Lab 12/14/2021 ?1830 12/11/2021 ?0308  ?NA 138 138  ?K 4.4 4.8  ?CL 103 104  ?CO2 24 28  ?GLUCOSE 123* 157*  ?BUN 25* 22  ?CREATININE 1.03* 1.00  ?CALCIUM 8.7* 8.3*  ?AST 30 20  ?ALT 13 15  ?ALKPHOS 120 99  ?BILITOT 0.8 0.5  ?ALBUMIN 3.9 3.4*  ?MG  --  1.9  ? ? ?------------------------------------------------------------------------------------------------------------------ ?No results for input(s): CHOL, HDL, LDLCALC, TRIG, CHOLHDL, LDLDIRECT in the last 72 hours. ? ?Lab Results  ?Component Value Date  ? HGBA1C 5.1 07/31/2018  ? ?------------------------------------------------------------------------------------------------------------------ ?No results for input(s): TSH, T4TOTAL, T3FREE, THYROIDAB in the last 72 hours. ? ?Invalid input(s): FREET3 ? ?Cardiac Enzymes ?No results for input(s): CKMB, TROPONINI, MYOGLOBIN in the last 168 hours. ? ?Invalid input(s): CK ?------------------------------------------------------------------------------------------------------------------ ?   ?Component Value Date/Time  ? BNP 70.2 09/14/2021 1811  ? ? ?CBG: ?No results for input(s): GLUCAP in the last 168 hours. ? ?Recent Results (from the past 240 hour(s))  ?MRSA Next Gen by PCR, Nasal     Status: None  ? Collection Time: 12/16/2021 12:29 AM  ? Specimen: Nasal Mucosa; Nasal Swab  ?Result Value Ref Range Status  ? MRSA by PCR Next Gen NOT DETECTED NOT DETECTED Final  ?  Comment:  (NOTE) ?The GeneXpert MRSA Assay (FDA approved for NASAL specimens only), ?is one component of a comprehensive MRSA colonization surveillance ?program. It is not intended to diagnose MRSA infection nor to guide ?or monitor treatment for MRSA infections. ?Test performance is not FDA approved in patients less than 2 years ?old. ?Performed at [facility], [place] [address] [place], [place] ?[address] ?  ?  ? ?Radiology Studies: ?CT Head Wo Contrast ? ?Result Date: 12/23/2021 ?CLINICAL DATA:  Status post trauma. EXAM: CT HEAD WITHOUT CONTRAST TECHNIQUE: Contiguous axial images were obtained from the base of the skull through the vertex without intravenous contrast. RADIATION DOSE REDUCTION: This exam was performed according to the departmental dose-optimization program which includes automated exposure control, adjustment of the mA and/or kV according to patient size and/or use of iterative reconstruction technique. COMPARISON:  December 24, 2019 FINDINGS: Brain: There is moderate to marked severity cerebral atrophy with widening of the extra-axial spaces and ventricular dilatation. There are areas of decreased attenuation within the white matter tracts of the supratentorial brain, consistent with microvascular disease changes. Vascular: No hyperdense vessel or unexpected calcification.  Skull: Normal. Negative for fracture or focal lesion. Sinuses/Orbits: No acute finding. Other: None. IMPRESSION: 1. No acute intracranial abnormality. 2. Generalized cerebral atrophy. Electronically Signed   By: Virgina Norfolk M.D.   On: 12/06/2021 19:37  ? ?CT Cervical Spine Wo Contrast ? ?Result Date: 12/24/2021 ?CLINICAL DATA:  Fall. EXAM: CT CERVICAL SPINE WITHOUT CONTRAST TECHNIQUE: Multidetector CT imaging of the cervical spine was performed without intravenous contrast. Multiplanar CT image reconstructions were also generated. RADIATION DOSE REDUCTION: This exam was performed according to the departmental dose-optimization  program which includes automated exposure control, adjustment of the mA and/or kV according to patient size and/or use of iterative reconstruction technique. COMPARISON:  Right shoulder x-ray 11/27/2021. FINDINGS: Deatra Canter

## 2021-12-10 NOTE — Anesthesia Pain Management Evaluation Note (Addendum)
?Anesthesia Pain Consult Note ? ?Patient: [name] [name], 76 y.o., female ? ?Consult Requested by: Caren Griffins, MD ? ?Reason for Consult: Right hip fracture ? ?Level of Consciousness: alert ? ?Pain: moderate  ? ?Last Vitals:  ?Vitals:  ? 12/12/2021 0515 11/24/2021 0700  ?BP: 102/69 126/73  ?Pulse: 81 80  ?Resp: 16   ?Temp: 36.8 ?C   ?SpO2: 95% 95%  ? ? ?Plan: Peripheral nerve block for pain control ? ?Risks of wet tap, epidural hematoma and spinal cord injury explained to:  ? ?Consent:Risks of procedure as well as the alternatives and risks of each were explained to the (patient/caregiver).  Consent for procedure obtained. ? ?Advance Directive:Patient named surrogate decision maker / provided an advance care plan. ? ?Allergies  ?Allergen Reactions  ?? Doxycycline Nausea Only and Other (See Comments)  ?  GI upset ?  ?? Oxycontin [Oxycodone] Other (See Comments)  ?  Overly-sedating, causes hypotension, decreased oxgyen  ?? Percocet [Oxycodone-Acetaminophen] Other (See Comments)  ?  Overly-sedating, causes hypotension, decreased oxgyen  ? ? ?Physical exam: ?PULM normal  ?CARDIO Heart sounds are normal.  Regular rate and rhythm without murmur, gallop or rub.  ?OTHER   ? ?I have reviewed the patient's medications listed below. ?? atorvastatin  40 mg Oral Daily  ?? ezetimibe  10 mg Oral QPM  ?? levothyroxine  50 mcg Oral Q0600  ?? mirabegron ER  50 mg Oral Daily  ?? mometasone-formoterol  2 puff Inhalation BID  ?? nystatin   Topical BID  ?? sertraline  100 mg Oral Daily  ? ? ?acetaminophen, fentaNYL (SUBLIMAZE) injection, HYDROcodone-acetaminophen, melatonin, ondansetron (ZOFRAN) IV, traMADol ? ?Past Medical History:  ?Diagnosis Date  ?? (HFpEF) heart failure with preserved ejection fraction ([place])   ? a. 11/2019 Echo: EF 60-65%, no rwma. Mild LVH. Mild AI, mild to mod AS.  ?? Aneurysm ([place])   ? She had 2.6 cm dilation of the infrarenal abdominal aorta on 08/01/15 CTA with 5 year [place] recommended  ?? Anxiety   ?? Anxiety  and depression   ?? Aortic stenosis   ? mild-moderate AS 12/23/19 echo  ?? Carotid stenosis   ? a. 06/2020 Carotid/Cerebral angio: LICA 70, L Vert 50, RCCA 50-60d, RICA 60-70p, 3.3x2.3 RICA aneurysm, RMCA M1 50-70.  ?? CHF (congestive heart failure) ([place])   ?? Compression fracture of body of thoracic vertebra (HCC)   ? T 10  ?? Concussion 08/03/2015  ?? COPD (chronic obstructive pulmonary disease) ([place]) 12/2012  ? spirometry: Pre: FVC 84%, FEV1 69%, ratio 0.64 consistent with moderate obstruction.  ?? Depression   ?? Fall 08/03/2015  ? d/c home health 08/2015  ?? Fracture of cervical vertebra, C5 (HCC) 08/06/2015  ?? History of chicken pox   ?? History of stress test   ? a. 01/2019 MV: No isch/infarct. EF 71%.  ?? Hyperlipidemia   ?? Hypertension   ?? Hypothyroidism   ?? Lower back pain   ? h/o HNP s/p surgery  ?? Neuropathy   ? B/L feet  ?? On supplemental oxygen by nasal cannula   ? at HS and PRN during the day  ?? Osteoarthritis   ? h/o ruptured disc s/p ESI  ?? Osteoporosis 11/2010  ? DEXA -2.7 spine, thoracic compression fracture  ?? Peripheral vascular disease ([place])   ?? Smoker   ? quit 10/2012  ?? Stroke [facility]) 2010  ? x3 with residual R hemiparesis, s/p R MCA balloon angioplasty (2010)  ?? Wears dentures   ? upper  ? ?  Past Surgical History:  ?Procedure Laterality Date  ?? APPENDECTOMY  1960  ?? CATARACT EXTRACTION    ? bilateral  ?? CESAREAN SECTION    ?? CHOLECYSTECTOMY  1970  ?? COLONOSCOPY  2004  ? diverticulosis, no polyps Sharlett Iles)  ?? COLONOSCOPY  05/2016  ? decreased sphincter tone, diverticulosis, no f/u recommended ([place])  ?? DEXA  11/2010  ? T -2.7 spine, -1.9 hip  ?? HIP SURGERY Left 2006  ? fractured - screws placed  ?? IR ANGIO INTRA EXTRACRAN SEL COM CAROTID INNOMINATE BILAT MOD SED  08/01/2018  ?? IR ANGIO INTRA EXTRACRAN SEL COM CAROTID INNOMINATE BILAT MOD SED  12/10/2018  ?? IR ANGIO INTRA EXTRACRAN SEL COM CAROTID INNOMINATE BILAT MOD SED  06/26/2020  ?? IR ANGIO INTRA EXTRACRAN SEL COM CAROTID  INNOMINATE BILAT MOD SED  04/02/2021  ?? IR ANGIO VERTEBRAL SEL SUBCLAVIAN INNOMINATE BILAT MOD SED  04/02/2021  ?? IR ANGIO VERTEBRAL SEL SUBCLAVIAN INNOMINATE UNI L MOD SED  06/26/2020  ?? IR ANGIO VERTEBRAL SEL SUBCLAVIAN INNOMINATE UNI R MOD SED  08/01/2018  ?? IR ANGIO VERTEBRAL SEL SUBCLAVIAN INNOMINATE UNI R MOD SED  12/10/2018  ?? IR ANGIO VERTEBRAL SEL VERTEBRAL UNI L MOD SED  12/10/2018  ?? IR RADIOLOGIST EVAL & MGMT  01/09/2018  ?? KYPHOPLASTY  10/02/2017  ? Procedure: LUMBAR FOUR KYPHOPLASTY;  Surgeon: Ashok Pall, MD;  Location: [place];  Service: Neurosurgery;;  ?? KYPHOPLASTY N/A 01/29/2020  ? Procedure: Thoracic Ten Kyphoplasty;  Surgeon: Ashok Pall, MD;  Location: [place] [facility];  Service: Neurosurgery;  Laterality: N/A;  ?? RADIOLOGY WITH ANESTHESIA N/A 11/11/2015  ? Procedure: RADIOLOGY WITH ANESTHESIA;  Surgeon: Luanne Bras, MD;  Location: [place];  Service: Radiology;  Laterality: N/A;  ?? RADIOLOGY WITH ANESTHESIA N/A 12/10/2018  ? Procedure: STENTING;  Surgeon: Luanne Bras, MD;  Location: [facility];  Service: Radiology;  Laterality: N/A;  ? ? reports that she quit smoking about 9 years ago. Her smoking use included cigarettes. She has a 92.00 pack-year smoking history. She has never used smokeless tobacco. She reports that she does not drink alcohol and does not use drugs.  ? ? ?Suzette Battiest E ?12/18/2021 ? ? ? ? ?

## 2021-12-10 NOTE — Progress Notes (Signed)
Initial Nutrition Assessment ? ?DOCUMENTATION CODES:  ? ?Not applicable ? ?INTERVENTION:  ? ?- Recommend Regular diet order once diet advanced ? ?- Once diet advanced, Ensure Enlive po BID, each supplement provides 350 kcal and 20 grams of protein ? ?- MVI with minerals daily ? ?NUTRITION DIAGNOSIS:  ? ?Increased nutrient needs related to hip fracture, chronic illness (COPD, CHF) as evidenced by estimated needs. ? ?GOAL:  ? ?Patient will meet greater than or equal to 90% of their needs ? ?MONITOR:  ? ?PO intake, Diet advancement, Labs, Weight trends ? ?REASON FOR ASSESSMENT:  ? ?Consult ?Hip fracture protocol ? ?ASSESSMENT:  ? ?76 year old female who presented to the ED on 3/16 after a mechanical fall. PMH of HTN, HLD, PVD, CKD stage III, CHF, COPD, anxiety, depression. Pt admitted with closed intertrochanteric fracture of the R hip and R clavicle fracture. ? ?Noted plan for OR today for open treatment of the R hip fx with IM nailing. Pt is currently NPO for procedure. ? ?Attempted to speak with pt at bedside. Pt in with provider during first RD visit. RD attempted a second visit and pt was in CT. Unable to obtain diet and weight history at this time. ? ?Reviewed weight history in chart. Weight appears fairly stable over the last 10 months with fluctuations between 79-85 kg. No real trends noted. ? ?RD to order oral nutrition supplements to aid pt in meeting increased nutrient needs related to fractures. Will also order daily MVI with minerals. Recommend Regular diet order once diet is advanced. ? ?Medications reviewed. ? ?Labs reviewed: WBC 10.7, hemoglobin 9.8 ? ?Diet Order:   ?Diet Order   ? ?       ?  Diet NPO time specified  Diet effective ____       ?  ? ?  ?  ? ?  ? ? ?EDUCATION NEEDS:  ? ?No education needs have been identified at this time ? ?Skin:  Skin Assessment: Reviewed RN Assessment ? ?Last BM:  11/24/2021 ? ?Height:  ? ?Ht Readings from Last 1 Encounters:  ?12/07/2021 4\' 9"  (1.448 m)  ? ? ?Weight:  ? ?Wt  Readings from Last 1 Encounters:  ?12/13/2021 85.3 kg  ? ? ?BMI:  Body mass index is 40.69 kg/m?. ? ?Estimated Nutritional Needs:  ? ?Kcal:  1700-1900 ? ?Protein:  90-105 grams ? ?Fluid:  1.7-1.9 L ? ? ? ?Gustavus Bryant, MS, RD, LDN ?Inpatient Clinical Dietitian ?Please see AMi[facility] for contact information. ? ?

## 2021-12-10 NOTE — Plan of Care (Signed)

## 2021-12-10 NOTE — Transfer of Care (Signed)
Immediate Anesthesia Transfer of Care Note ? ?Patient: [name] [name] ? ?Procedure(s) Performed: ORIF INTRAMEDULLARY (IM) NAIL INTERTROCHANTRIC (Right) ? ?Patient Location: PACU ? ?Anesthesia Type:General and Regional ? ?Level of Consciousness: awake and alert  ? ?Airway & Oxygen Therapy: Patient Spontanous Breathing and Patient connected to nasal cannula oxygen ? ?Post-op Assessment: Report given to RN and Post -op Vital signs reviewed and stable ? ?Post vital signs: Reviewed and stable ? ?Last Vitals:  ?Vitals Value Taken Time  ?BP 88/47 12/02/2021 2025  ?Temp 36.4 ?C 12/16/2021 2025  ?Pulse 99 11/27/2021 2028  ?Resp 18 11/24/2021 2028  ?SpO2 99 % 12/20/2021 2028  ?Vitals shown include unvalidated device data. ? ?Last Pain:  ?Vitals:  ? 11/29/2021 1629  ?TempSrc:   ?PainSc: 8   ?   ? ?Patients Stated Pain Goal: 2 (11/25/2021 1629) ? ?Complications: No notable events documented. ?

## 2021-12-10 NOTE — TOC CAGE-AID Note (Signed)
Transition of Care (TOC) - CAGE-AID Screening ? ? ?Patient Details  ?Name: [name] [name] ?MRN: [id number] ?Date of Birth: November 14, 1945 ? ?Transition of Care (TOC) CM/SW Contact:    ?Raphaella Larkin C Tarpley-Carter, LCSWA ?Phone Number: ?12-Dec-2021, 3:02 PM ? ? ?Clinical Narrative: ?Pt participated in Cage-Aid.  Pt stated she does not use substance or ETOH.  Pt was not offered resources, due to no usage of substance or ETOH.   ? ?Insurance underwriter, MSW, LCSW-A ?Pronouns:  She/Her/Hers ?[facility] ?Clinical Social Worker ?Direct Number:  [phone] ?[email or web address] ? ? ? ?CAGE-AID Screening: ?  ? ?Have You Ever Felt You Ought to Cut Down on Your Drinking or Drug Use?: No ?Have People Annoyed You By Critizing Your Drinking Or Drug Use?: No ?Have You Felt Bad Or Guilty About Your Drinking Or Drug Use?: No ?  ?  ? ?Substance Abuse Education Offered: No ? ?  ? ? ? ? ? ? ?

## 2021-12-10 NOTE — Anesthesia Preprocedure Evaluation (Addendum)
Anesthesia Evaluation  ?Patient identified by MRN, date of birth, ID band ?Patient awake ? ? ? ?Reviewed: ?Allergy & Precautions, NPO status , Patient's Chart, lab work & pertinent test results ? ?Airway ?Mallampati: II ? ?TM Distance: >3 FB ?Neck ROM: Full ? ? ? Dental ? ?(+) Upper Dentures, Missing, Poor Dentition ?  ?Pulmonary ?COPD,  COPD inhaler and oxygen dependent, former smoker,  ?  ?Pulmonary exam normal ?breath sounds clear to auscultation ? ? ? ? ? ? Cardiovascular ?hypertension, + Peripheral Vascular Disease and +CHF  ?Normal cardiovascular exam+ Valvular Problems/Murmurs AS  ?Rhythm:Regular Rate:Normal ? ?Echo 12/23/19: ?1. Left ventricular ejection fraction, by estimation, is 60 to 65%. The left ventricle has normal function. The left ventricle has no regional wall motion abnormalities. There is mild left ventricular hypertrophy. Left ventricular diastolic parameters are indeterminate.  ??2. Right ventricular systolic function is mildly reduced. The right ventricular size is normal. Tricuspid regurgitation signal is inadequate for assessing PA pressure.  ??3. The mitral valve is normal in structure. No evidence of mitral valve regurgitation.  ??4. The aortic valve is abnormal. Moderately calcified. Aortic valve regurgitation is mild. Mild to moderate aortic valve stenosis. Vmax 2.3 m/s, MG 14 mmHg, AVA 1.2 cm^2, DI 0.43  ?  ?Neuro/Psych ?PSYCHIATRIC DISORDERS Anxiety Depression CVA, Residual Symptoms   ? GI/Hepatic ?negative GI ROS, Neg liver ROS,   ?Endo/Other  ?Hypothyroidism Morbid obesity ? Renal/GU ?Renal InsufficiencyRenal disease  ? ?  ?Musculoskeletal ? ?(+) Arthritis , Right hip intertroch fracture s/p femoral nerve block earlier today  ? Abdominal ?  ?Peds ? Hematology ? ?(+) Blood dyscrasia (PLavix), anemia ,   ?Anesthesia Other Findings ?Day of surgery medications reviewed with the patient. ? Reproductive/Obstetrics ? ?  ? ? ? ? ? ? ? ? ? ? ? ? ? ?  ?   ? ? ? ? ? ? ? ?Anesthesia Physical ?Anesthesia Plan ? ?ASA: 3 ? ?Anesthesia Plan: General  ? ?Post-op Pain Management: Ofirmev IV (intra-op)*  ? ?Induction: Intravenous ? ?PONV Risk Score and Plan: 3 and Dexamethasone and Ondansetron ? ?Airway Management Planned: Oral ETT ? ?Additional Equipment:  ? ?Intra-op Plan:  ? ?Post-operative Plan: Extubation in OR ? ?Informed Consent: I have reviewed the patients History and Physical, chart, labs and discussed the procedure including the risks, benefits and alternatives for the proposed anesthesia with the patient or authorized representative who has indicated his/her understanding and acceptance.  ? ? ? ?Dental advisory given ? ?Plan Discussed with: CRNA ? ?Anesthesia Plan Comments:   ? ? ? ? ? ?Anesthesia Quick Evaluation ? ?

## 2021-12-10 NOTE — Discharge Instructions (Signed)
? ?Dr. Arlys John Swinteck ?Adult Hip & Knee Specialist ?[facility] ?[address] ?[place], [place] [address] ?[phone] ? ? ?POSTOPERATIVE DIRECTIONS ? ? ? ?Hip Rehabilitation, Guidelines Following Surgery  ? ?WEIGHT BEARING ?Weight bearing as tolerated with assist device (walker, cane, etc) as directed, use it as long as suggested by your surgeon or therapist, typically at least 4-6 weeks. ?NWB RUE. Remove sling daily for axillary care and hygiene   ? ?HOME CARE INSTRUCTIONS  ?Remove items at home which could result in a fall. This includes throw rugs or furniture in walking pathways.  ?Continue medications as instructed at time of discharge. ?You may have some home medications which will be placed on hold until you complete the course of blood thinner medication. ?4 days after discharge, you may start showering. No tub baths or soaking your incisions. ?Do not put on socks or shoes without following the instructions of your caregivers.   ?Sit on chairs with arms. Use the chair arms to help push yourself up when arising.  ?Arrange for the use of a toilet seat elevator so you are not sitting low.  ?Walk with walker as instructed.  ?You may resume a sexual relationship in one month or when given the OK by your caregiver.  ?Use walker as long as suggested by your caregivers.  ?Avoid periods of inactivity such as sitting longer than an hour when not asleep. This helps prevent blood clots.  ?You may return to work once you are cleared by Designer, industrial/product.  ?Do not drive a car for 6 weeks or until released by your surgeon.  ?Do not drive while taking narcotics.  ?Wear elastic stockings for two weeks following surgery during the day but you may remove then at night.  ?Make sure you keep all of your appointments after your operation with all of your doctors and caregivers. You should call the office at the above phone number and make an appointment for approximately two weeks after the date of your  surgery. ?Please pick up a stool softener and laxative for home use as long as you are requiring pain medications. ?ICE to the affected hip every three hours for 30 minutes at a time and then as needed for pain and swelling. Continue to use ice on the hip for pain and swelling from surgery. You may notice swelling that will progress down to the foot and ankle.  This is normal after surgery.  Elevate the leg when you are not up walking on it.   ?It is important for you to complete the blood thinner medication as prescribed by your doctor. ?Continue to use the breathing machine which will help keep your temperature down.  It is common for your temperature to cycle up and down following surgery, especially at night when you are not up moving around and exerting yourself.  The breathing machine keeps your lungs expanded and your temperature down. ? ?RANGE OF MOTION AND STRENGTHENING EXERCISES  ?These exercises are designed to help you keep full movement of your hip joint. Follow your caregiver's or physical therapist's instructions. Perform all exercises about fifteen times, three times per day or as directed. Exercise both hips, even if you have had only one joint replacement. These exercises can be done on a training (exercise) mat, on the floor, on a table or on a bed. Use whatever works the best and is most comfortable for you. Use music or television while you are exercising so that the exercises are a  pleasant break in your day. This will make your life better with the exercises acting as a break in routine you can look forward to.  ?Lying on your back, slowly slide your foot toward your buttocks, raising your knee up off the floor. Then slowly slide your foot back down until your leg is straight again.  ?Lying on your back spread your legs as far apart as you can without causing discomfort.  ?Lying on your side, raise your upper leg and foot straight up from the floor as far as is comfortable. Slowly lower the  leg and repeat.  ?Lying on your back, tighten up the muscle in the front of your thigh (quadriceps muscles). You can do this by keeping your leg straight and trying to raise your heel off the floor. This helps strengthen the largest muscle supporting your knee.  ?Lying on your back, tighten up the muscles of your buttocks both with the legs straight and with the knee bent at a comfortable angle while keeping your heel on the floor.  ? ?SKILLED REHAB INSTRUCTIONS: ?If the patient is transferred to a skilled rehab facility following release from the hospital, a list of the current medications will be sent to the facility for the patient to continue.  When discharged from the skilled rehab facility, please have the facility set up the patient's Home Health Physical Therapy prior to being released. Also, the skilled facility will be responsible for providing the patient with their medications at time of release from the facility to include their pain medication and their blood thinner medication. If the patient is still at the rehab facility at time of the two week follow up appointment, the skilled rehab facility will also need to assist the patient in arranging follow up appointment in our office and any transportation needs. ? ?MAKE SURE YOU:  ?Understand these instructions.  ?Will watch your condition.  ?Will get help right away if you are not doing well or get worse. ? ?Pick up stool softner and laxative for home use following surgery while on pain medications. ?Daily dry dressing changes as needed. ?In 4 days, you may remove your dressings and begin taking showers - no tub baths or soaking the incisions. ?Continue to use ice for pain and swelling after surgery. ?Do not use any lotions or creams on the incision until instructed by your surgeon.  ?

## 2021-12-10 NOTE — Op Note (Signed)
OPERATIVE REPORT ? ?SURGEON: Rod Can, MD  ? ?ASSISTANT: Larene Pickett, PA-C. ? ?PREOPERATIVE DIAGNOSIS: Right intertrochanteric femur fracture.  ? ?POSTOPERATIVE DIAGNOSIS: Right intertrochanteric femur fracture.  ? ?PROCEDURE: Intramedullary fixation, Right femur.  ? ?IMPLANTS: Biomet Affixus Hip Fracture Nail, 9 by 360 mm, 125 degrees. ?10.5 x 85 mm Hip Fracture Nail Lag Screw. ?5 x 40 mm distal interlocking screw ?1. ? ?ANESTHESIA:  General ? ?ESTIMATED BLOOD LOSS:-250 mL   ? ?ANTIBIOTICS: 2 g Ancef. ? ?DRAINS: None. ? ?COMPLICATIONS: None. ?  ?CONDITION: PACU - hemodynamically stable..  ? ?BRIEF CLINICAL NOTE: [name] [name] is a 76 y.o. female who presented with an intertrochanteric femur fracture. The patient was admitted to the hospitalist service and underwent perioperative risk stratification and medical optimization. The risks, benefits, and alternatives to the procedure were explained, and the patient elected to proceed. ? ?PROCEDURE IN DETAIL: Surgical site was marked by myself. The patient was taken to the operating room and anesthesia was induced on the bed. The patient was then transferred to the [facility] [place] table and the nonoperative lower extremity was scissored underneath the operative side. The fracture was reduced with traction, internal rotation, and adduction. The hip was prepped and draped in the normal sterile surgical fashion. Timeout was called verifying side and site of surgery. Preop antibiotics were given with 60 minutes of beginning the procedure. ? ?Fluoroscopy was used to define the patient's anatomy. A 4 cm incision was made just proximal to the tip of the greater trochanter. The awl was used to obtain the standard starting point for a trochanteric entry nail under fluoroscopic control. The guidepin was placed. The entry reamer was used to open the proximal femur. ? ?I placed the guidewire to the level of the physeal scar of the knee. I measured the length of the guidewire. A size 9  by 360 mm nail was selected and assembled to the jig on the back table. The nail was placed without any difficulty. Through a separate stab incision, the cannula was placed down to the bone in preparation for the cephalomedullary device. A guidepin was placed into the femoral head using AP and lateral fluoroscopy views. The pin was measured, and then reaming was performed to the appropriate depth. The lag screw was inserted to the appropriate depth. The fracture was compressed through the jig. The setscrew was tightened and then loosened one quarter turn. Using perfect circle technique, a distal interlocking screw was placed. The jig was removed. Final AP and lateral fluoroscopy views were obtained to confirm fracture reduction and hardware placement. Tip apex distance was appropriate. There was no chondral penetration. ? ?The wounds were copiously irrigated with saline. The wound was closed in layers with #1 Vicryl for the fascia, 2-0 Monocryl for the deep dermal layer, and 3-0 Monocryl subcuticular stitch. Glue was applied to the skin. Once the glue was fully hardened, sterile dressing was applied. The patient was then awakened from anesthesia and taken to the PACU in stable condition. Sponge needle and instrument counts were correct at the end of the case ?2. There were no known complications. ? ?Please note that a surgical assistant was a medical necessity for this procedure to perform it in a safe and expeditious manner. Assistant was necessary to provide appropriate retraction of vital neurovascular structures, to prevent femoral fracture, and to allow for anatomic placement of the prosthesis. ? ?POSTOPERATIVE PLAN:  ?We will readmit the patient to the hospitalist. Weightbearing status will be weightbearing as tolerated RLE with a  walker, non weight bearing RUE. We will resume plavix and start ASA 81 mg PO BID for DVT prophylaxis. PO Fluconazle for 48 hours and topical nystatin powder for pannicular candida.  The patient will mobilize out of bed with physical therapy and undergo disposition planning. ?

## 2021-12-10 NOTE — Anesthesia Procedure Notes (Addendum)
Anesthesia Regional Block: Femoral nerve block  ? ?Pre-Anesthetic Checklist: , timeout performed,  Correct Patient, Correct Site, Correct Laterality,  Correct Procedure, Correct Position, site marked,  Risks and benefits discussed,  Surgical consent,  Pre-op evaluation,  At surgeon's request and post-op pain management ? ?Laterality: Right ? ?Prep: chloraprep     ?  ?Needles:  ?Injection technique: Single-shot ? ?Needle Type: Echogenic Stimulator Needle   ? ? ?Needle Length: 9cm  ?Needle Gauge: 21  ? ? ? ?Additional Needles: ? ? ?Procedures:, nerve stimulator,,, ultrasound used (permanent image in chart),,    ? ?Nerve Stimulator or Paresthesia:  ?Response: quadriceps, 0.5 mA ? ?Additional Responses:  ? ?Narrative:  ?Start time: 12/01/2021 9:35 AM ?End time: 11/29/2021 9:42 AM ?Injection made incrementally with aspirations every 5 mL. ? ?Performed by: Personally  ?Anesthesiologist: Marcene Duos, MD ? ? ? ? ?

## 2021-12-10 NOTE — Progress Notes (Signed)
Pt's daughter at bedside refused CT ordered by MD, she's concerned that it will cause more pain to her mother and also added that the block did not work. MD notified. ? ?62 RN talked to Pt and daughter again, they've reconsidered and now agreeable to do CT.  ? ?CT called and updated. ?

## 2021-12-10 NOTE — Interval H&P Note (Signed)
History and Physical Interval Note: ? ?11/29/2021 ?6:19 PM ? ?[name] [name]  has presented today for surgery, with the diagnosis of RIGHT HIP [place].  The various methods of treatment have been discussed with the patient and family. After consideration of risks, benefits and other options for treatment, the patient has consented to  Procedure(s): ?INTRAMEDULLARY (IM) NAIL INTERTROCHANTRIC (Right) as a surgical intervention.  The patient's history has been reviewed, patient examined, no change in status, stable for surgery.  I have reviewed the patient's chart and labs.  Questions were answered to the patient's satisfaction.   ? ?The risks, benefits, and alternatives were discussed with the patient. There are risks associated with the surgery including, but not limited to, problems with anesthesia (death), infection, differences in leg length/angulation/rotation, fracture of bones, loosening or failure of implants, malunion, nonunion, hematoma (blood accumulation) which may require surgical drainage, blood clots, pulmonary embolism, nerve injury (foot drop), and blood vessel injury. The patient understands these risks and elects to proceed. ? ? ? ?[place] Sharesa Kemp ? ? ?

## 2021-12-10 NOTE — Anesthesia Procedure Notes (Signed)
Procedure Name: Intubation ?Date/Time: 12/06/2021 6:42 PM ?Performed by: Reece Agar, CRNA ?Pre-anesthesia Checklist: Patient identified, Emergency Drugs available, Suction available and Patient being monitored ?Patient Re-evaluated:Patient Re-evaluated prior to induction ?Oxygen Delivery Method: Circle System Utilized ?Preoxygenation: Pre-oxygenation with 100% oxygen ?Induction Type: IV induction ?Ventilation: Mask ventilation without difficulty ?Laryngoscope Size: Mac and 3 ?Grade View: Grade I ?Tube type: Oral ?Tube size: 7.0 mm ?Number of attempts: 1 ?Airway Equipment and Method: Stylet ?Placement Confirmation: ETT inserted through vocal cords under direct vision, positive ETCO2 and breath sounds checked- equal and bilateral ?Secured at: 21 cm ?Tube secured with: Tape ?Dental Injury: Teeth and Oropharynx as per pre-operative assessment  ? ? ? ? ?

## 2021-12-11 DIAGNOSIS — I214 Non-ST elevation (NSTEMI) myocardial infarction: Secondary | ICD-10-CM | POA: Diagnosis not present

## 2021-12-11 DIAGNOSIS — S72141A Displaced intertrochanteric fracture of right femur, initial encounter for closed fracture: Secondary | ICD-10-CM | POA: Diagnosis not present

## 2021-12-11 DIAGNOSIS — I5032 Chronic diastolic (congestive) heart failure: Secondary | ICD-10-CM | POA: Diagnosis not present

## 2021-12-11 DIAGNOSIS — J439 Emphysema, unspecified: Secondary | ICD-10-CM | POA: Diagnosis not present

## 2021-12-11 LAB — COMPREHENSIVE METABOLIC PANEL
ALT: 14 U/L (ref 0–44)
AST: 25 U/L (ref 15–41)
Albumin: 3 g/dL — ABNORMAL LOW (ref 3.5–5.0)
Alkaline Phosphatase: 74 U/L (ref 38–126)
Anion gap: 7 (ref 5–15)
BUN: 16 mg/dL (ref 8–23)
CO2: 25 mmol/L (ref 22–32)
Calcium: 7.7 mg/dL — ABNORMAL LOW (ref 8.9–10.3)
Chloride: 106 mmol/L (ref 98–111)
Creatinine, Ser: 1.03 mg/dL — ABNORMAL HIGH (ref 0.44–1.00)
GFR, Estimated: 57 mL/min — ABNORMAL LOW (ref >60)
Glucose, Bld: 187 mg/dL — ABNORMAL HIGH (ref 70–99)
Potassium: 4.6 mmol/L (ref 3.5–5.1)
Sodium: 138 mmol/L (ref 135–145)
Total Bilirubin: 0.3 mg/dL (ref 0.3–1.2)
Total Protein: 5.4 g/dL — ABNORMAL LOW (ref 6.5–8.1)

## 2021-12-11 LAB — HEMOGLOBIN AND HEMATOCRIT, BLOOD
HCT: 25.2 % — ABNORMAL LOW (ref 36.0–46.0)
Hemoglobin: 7.9 g/dL — ABNORMAL LOW (ref 12.0–15.0)

## 2021-12-11 LAB — CBC
HCT: 21.5 % — ABNORMAL LOW (ref 36.0–46.0)
Hemoglobin: 6.9 g/dL — CL (ref 12.0–15.0)
MCH: 31.4 pg (ref 26.0–34.0)
MCHC: 32.1 g/dL (ref 30.0–36.0)
MCV: 97.7 fL (ref 80.0–100.0)
Platelets: 109 10*3/uL — ABNORMAL LOW (ref 150–400)
RBC: 2.2 MIL/uL — ABNORMAL LOW (ref 3.87–5.11)
RDW: 13.5 % (ref 11.5–15.5)
WBC: 10.9 10*3/uL — ABNORMAL HIGH (ref 4.0–10.5)
nRBC: 0 % (ref 0.0–0.2)

## 2021-12-11 LAB — TROPONIN I (HIGH SENSITIVITY)
Troponin I (High Sensitivity): 3568 ng/L (ref <18)
Troponin I (High Sensitivity): 7330 ng/L (ref <18)
Troponin I (High Sensitivity): 8873 ng/L (ref <18)

## 2021-12-11 LAB — PREPARE RBC (CROSSMATCH)

## 2021-12-11 MED ORDER — METOPROLOL TARTRATE 12.5 MG HALF TABLET
12.5000 mg | ORAL_TABLET | Freq: Two times a day (BID) | ORAL | Status: DC
  Administered 2021-12-11 – 2021-12-19 (×11): 12.5 mg via ORAL
  Filled 2021-12-11 (×12): qty 1

## 2021-12-11 MED ORDER — HEPARIN (PORCINE) 25000 UT/250ML-% IV SOLN
1000.0000 [IU]/h | INTRAVENOUS | Status: DC
  Administered 2021-12-11: 750 [IU]/h via INTRAVENOUS
  Filled 2021-12-11: qty 250

## 2021-12-11 MED ORDER — HYDROMORPHONE HCL 2 MG PO TABS
2.0000 mg | ORAL_TABLET | ORAL | Status: DC | PRN
  Administered 2021-12-11: 2 mg via ORAL
  Filled 2021-12-11: qty 1

## 2021-12-11 MED ORDER — NYSTATIN 100000 UNIT/GM EX CREA
TOPICAL_CREAM | Freq: Two times a day (BID) | CUTANEOUS | Status: DC
  Administered 2021-12-17 – 2021-12-18 (×2): 1 via TOPICAL
  Filled 2021-12-11 (×3): qty 30

## 2021-12-11 MED ORDER — ASPIRIN 81 MG PO CHEW
162.0000 mg | CHEWABLE_TABLET | Freq: Once | ORAL | Status: AC
  Administered 2021-12-11: 162 mg via ORAL
  Filled 2021-12-11: qty 2

## 2021-12-11 MED ORDER — SODIUM CHLORIDE 0.9% IV SOLUTION: Freq: Once | INTRAVENOUS | Status: AC

## 2021-12-11 MED ORDER — LIDOCAINE 5 % EX PTCH
1.0000 | MEDICATED_PATCH | CUTANEOUS | Status: DC
Start: 2021-12-11 — End: 2021-12-20
  Administered 2021-12-11 – 2021-12-20 (×8): 1 via TRANSDERMAL
  Filled 2021-12-11 (×9): qty 1

## 2021-12-11 MED ORDER — HYDROMORPHONE HCL 1 MG/ML IJ SOLN
0.5000 mg | INTRAMUSCULAR | Status: DC | PRN
  Administered 2021-12-11 (×2): 0.5 mg via INTRAVENOUS
  Administered 2021-12-11 – 2021-12-12 (×2): 1 mg via INTRAVENOUS
  Filled 2021-12-11 (×4): qty 1

## 2021-12-11 MED ORDER — CALCIUM CARBONATE ANTACID 500 MG PO CHEW
1.0000 | CHEWABLE_TABLET | Freq: Three times a day (TID) | ORAL | Status: DC | PRN
  Administered 2021-12-11 (×2): 200 mg via ORAL
  Filled 2021-12-11 (×2): qty 1

## 2021-12-11 NOTE — Consult Note (Signed)
?Cardiology Consultation:  ? ?Patient ID: [name] [name] ?MRN: [id number]; DOB: 1946-08-19 ? ?Admit date: 11/26/2021 ?Date of Consult: 12/11/2021 ? ?PCP:  [name], MD ?  ?[facility] ?Cardiologist:  Julien Nordmann, MD      ? ? ?Patient Profile:  ? ?[name] [name] is a 76 y.o. female with a hx of CVA, COPD 40 pack year smoker, carotid artery dx, severe R subclavian stenosis and severe right and moderate left vertebral artery origin stenoses, moderate AS, chronic dyspnea multifactorial (COPD, AS, HFpeF) who is being seen 12/11/2021 for the evaluation of NSTEMI at the request of Dr. Elvera Lennox. ? ?History of Present Illness:  ? ?See hospitalist consult note for details prior to consult. Briefly, [name] [name] is s/p mechanical fall from home. She underwent closed ORIF for R intertrochanteric fracture yesterday. She developed chest pressure overnight post-op. She notes intermittent chest pressure. Denied prior chest pain. EKG showed new ST depressions laterally. She had troponin 3,568. She is HDS. She is a patient of Dr. Mariah Milling. Last echo 11/2019 showed normal LV function with mild to moderate AS. ? ? ?Past Medical History:  ?Diagnosis Date  ? (HFpEF) heart failure with preserved ejection fraction (HCC)   ? a. 11/2019 Echo: EF 60-65%, no rwma. Mild LVH. Mild AI, mild to mod AS.  ? Aneurysm (HCC)   ? She had 2.6 cm dilation of the infrarenal abdominal aorta on 08/01/15 CTA with 5 year [place] recommended  ? Anxiety   ? Anxiety and depression   ? Aortic stenosis   ? mild-moderate AS 12/23/19 echo  ? Carotid stenosis   ? a. 06/2020 Carotid/Cerebral angio: LICA 70, L Vert 50, RCCA 50-60d, RICA 60-70p, 3.3x2.3 RICA aneurysm, RMCA M1 50-70.  ? CHF (congestive heart failure) (HCC)   ? Compression fracture of body of thoracic vertebra (HCC)   ? T 10  ? Concussion 08/03/2015  ? COPD (chronic obstructive pulmonary disease) (HCC) 12/2012  ? spirometry: Pre: FVC 84%, FEV1 69%, ratio 0.64 consistent with moderate  obstruction.  ? Depression   ? Fall 08/03/2015  ? d/c home health 08/2015  ? Fracture of cervical vertebra, C5 (HCC) 08/06/2015  ? History of chicken pox   ? History of stress test   ? a. 01/2019 MV: No isch/infarct. EF 71%.  ? Hyperlipidemia   ? Hypertension   ? Hypothyroidism   ? Lower back pain   ? h/o HNP s/p surgery  ? Neuropathy   ? B/L feet  ? On supplemental oxygen by nasal cannula   ? at HS and PRN during the day  ? Osteoarthritis   ? h/o ruptured disc s/p ESI  ? Osteoporosis 11/2010  ? DEXA -2.7 spine, thoracic compression fracture  ? Peripheral vascular disease (HCC)   ? Smoker   ? quit 10/2012  ? Stroke [facility]) 2010  ? x3 with residual R hemiparesis, s/p R MCA balloon angioplasty (2010)  ? Wears dentures   ? upper  ? ? ?Past Surgical History:  ?Procedure Laterality Date  ? APPENDECTOMY  1960  ? CATARACT EXTRACTION    ? bilateral  ? CESAREAN SECTION    ? CHOLECYSTECTOMY  1970  ? COLONOSCOPY  2004  ? diverticulosis, no polyps Jarold Motto)  ? COLONOSCOPY  05/2016  ? decreased sphincter tone, diverticulosis, no f/u recommended ([place])  ? DEXA  11/2010  ? T -2.7 spine, -1.9 hip  ? HIP SURGERY Left 2006  ? fractured - screws placed  ? IR ANGIO INTRA EXTRACRAN SEL COM  CAROTID INNOMINATE BILAT MOD SED  08/01/2018  ? IR ANGIO INTRA EXTRACRAN SEL COM CAROTID INNOMINATE BILAT MOD SED  12/10/2018  ? IR ANGIO INTRA EXTRACRAN SEL COM CAROTID INNOMINATE BILAT MOD SED  06/26/2020  ? IR ANGIO INTRA EXTRACRAN SEL COM CAROTID INNOMINATE BILAT MOD SED  04/02/2021  ? IR ANGIO VERTEBRAL SEL SUBCLAVIAN INNOMINATE BILAT MOD SED  04/02/2021  ? IR ANGIO VERTEBRAL SEL SUBCLAVIAN INNOMINATE UNI L MOD SED  06/26/2020  ? IR ANGIO VERTEBRAL SEL SUBCLAVIAN INNOMINATE UNI R MOD SED  08/01/2018  ? IR ANGIO VERTEBRAL SEL SUBCLAVIAN INNOMINATE UNI R MOD SED  12/10/2018  ? IR ANGIO VERTEBRAL SEL VERTEBRAL UNI L MOD SED  12/10/2018  ? IR RADIOLOGIST EVAL & MGMT  01/09/2018  ? KYPHOPLASTY  10/02/2017  ? Procedure: LUMBAR FOUR KYPHOPLASTY;  Surgeon: Ashok Pall,  MD;  Location: [place];  Service: Neurosurgery;;  ? KYPHOPLASTY N/A 01/29/2020  ? Procedure: Thoracic Ten Kyphoplasty;  Surgeon: Ashok Pall, MD;  Location: [place];  Service: Neurosurgery;  Laterality: N/A;  ? RADIOLOGY WITH ANESTHESIA N/A 11/11/2015  ? Procedure: RADIOLOGY WITH ANESTHESIA;  Surgeon: Luanne Bras, MD;  Location: [place];  Service: Radiology;  Laterality: N/A;  ? RADIOLOGY WITH ANESTHESIA N/A 12/10/2018  ? Procedure: STENTING;  Surgeon: Luanne Bras, MD;  Location: [place];  Service: Radiology;  Laterality: N/A;  ?  ? ?Home Medications:  ?Prior to Admission medications   ?Medication Sig Start Date End Date Taking? Authorizing Provider  ?acetaminophen (TYLENOL) 500 MG tablet Take 1 tablet (500 mg total) by mouth every 6 (six) hours as needed for headache. ?Patient taking differently: Take 500 mg by mouth every 6 (six) hours as needed for headache or mild pain. 01/01/19  Yes Ria Bush, MD  ?albuterol (VENTOLIN HFA) 108 (90 Base) MCG/ACT inhaler INHALE 2 PUFFS INTO THE LUNGS EVERY 6 (SIX) HOURS AS NEEDED FOR WHEEZING OR SHORTNESS OF BREATH. 02/03/20  Yes Ria Bush, MD  ?alendronate (FOSAMAX) 70 MG tablet TAKE 1 TABLET EVERY WEEK WITH A FULL GLASS OF WATER ON AN EMPTY STOMACH. ?Patient taking differently: Take 70 mg by mouth every Monday. TAKE WITH A FULL GLASS OF WATER ON AN EMPTY STOMACH 10/13/21  Yes Ria Bush, MD  ?aspirin (ASPIRIN 81) 81 MG EC tablet Take 1 tablet (81 mg total) by mouth daily. Swallow whole. 12/15/18  Yes Ria Bush, MD  ?atorvastatin (LIPITOR) 40 MG tablet TAKE 1 TABLET EVERY DAY ?Patient taking differently: Take 40 mg by mouth daily. 12/03/21  Yes Ria Bush, MD  ?Cholecalciferol (VITAMIN D3) 25 MCG (1000 UT) CAPS Take 1,000 Units by mouth daily with breakfast.   Yes [provider]  ?clopidogrel (PLAVIX) 75 MG tablet TAKE 1 TABLET (75 MG TOTAL) BY MOUTH DAILY. 06/21/21  Yes Ria Bush, MD  ?estradiol (ESTRACE) 0.1 MG/GM vaginal  cream Estrogen Cream Instruction Discard applicator Apply pea sized amount to tip of finger to urethra before bed. Wash hands well after application. Use Monday, Wednesday and Friday ?Patient taking differently: See admin instructions. Place a pea-sized amount of cream onto the tip of a finger and apply to the urethra before bed on Mon/Wed/Fri. Wash hands well after application. 11/06/20  Yes Billey Co, MD  ?ezetimibe (ZETIA) 10 MG tablet Take 1 tablet (10 mg total) by mouth daily. ?Patient taking differently: Take 10 mg by mouth at bedtime. 09/28/21  Yes Minna Merritts, MD  ?furosemide (LASIX) 20 MG tablet Take 1 tablet (20 mg total) by mouth daily as  needed for fluid. Weight every morning before given ?Patient taking differently: Take 20 mg by mouth See admin instructions. Take 20 mg by mouth every Sunday and an additional 20 mg once a day for fluid or edema/weigh every morning 03/15/21  Yes Gollan, Kathlene November, MD  ?levothyroxine (SYNTHROID) 50 MCG tablet TAKE 1 TABLET EVERY DAY BEFORE BREAKFAST ?Patient taking differently: Take 50 mcg by mouth daily before breakfast. 06/21/21  Yes Ria Bush, MD  ?Melatonin 5 MG CHEW Chew 10 mg by mouth daily. ?Patient taking differently: Chew 10 mg by mouth at bedtime. 06/02/21  Yes Ria Bush, MD  ?mirabegron ER (MYRBETRIQ) 50 MG TB24 tablet Take 1 tablet (50 mg total) by mouth daily. 12/03/20  Yes Billey Co, MD  ?nystatin cream (MYCOSTATIN) Apply 1 application topically 2 (two) times daily. ?Patient taking differently: Apply 1 application. topically See admin instructions. Apply to affected/rashy areas of the groin in the morning and at bedtime 10/29/21  Yes Ria Bush, MD  ?OXYGEN Inhale 2 L/min into the lungs See admin instructions. Inhale 2 L/min of oxygen at bedtime and during the day as needed for shortness of breath   Yes [provider]  ?Potassium Chloride ER 20 MEQ TBCR Take 1 tablet by mouth 2 (two) times daily as needed (with  lasix use). ?Patient taking differently: Take 20 mEq by mouth See admin instructions. Take 20 mEq by mouth every Sunday and an additional 20 mEq once daily only on days when Lasix is taken 06/21/21  Yes Danise Mina, Greggory Brandy

## 2021-12-11 NOTE — Progress Notes (Signed)
PT Cancellation Note ? ?Patient Details ?Name: [name] [name] ?MRN: [id number] ?DOB: 12-Oct-1945 ? ? ?Cancelled Treatment:    Reason Eval/Treat Not Completed: Pain limiting ability to participate. Pain remains to high to attempt any mobility. Will try again tomorrow. ? ? ?Shary Decamp [facility] ?12/11/2021, 1:10 PM ?[facility] PT ?Acute Rehabilitation Services ?Pager [phone] ?Office [phone] ? ?

## 2021-12-11 NOTE — Progress Notes (Signed)
[name] [name]  ?MRN: [id number] ?DOB/Age: 29-Jul-1946 76 y.o. ?Physician: Lynnea Maizes, M.D. ?1 Day Post-Op Procedure(s) (LRB): ?ORIF INTRAMEDULLARY (IM) NAIL INTERTROCHANTRIC (Right) ? ?Subjective: ?Patient lying in bed, family at bedside, complaining of severe hip and leg pain and associated muscular spasms. ?Vital Signs ?Temp:  [97.5 ?F (36.4 ?C)-99.9 ?F (37.7 ?C)] 98.8 ?F (37.1 ?C) (03/18 0175) ?Pulse Rate:  [68-113] 113 (03/18 0951) ?Resp:  [14-34] 18 (03/18 0951) ?BP: (86-133)/(30-78) 120/71 (03/18 0951) ?SpO2:  [93 %-100 %] 96 % (03/18 0951) ? ?Lab Results ?Recent Labs  ?  2021-12-16 ?0308 12/11/21 ?0336  ?WBC 10.7* 10.9*  ?HGB 9.8* 6.9*  ?HCT 31.9* 21.5*  ?PLT 172 109*  ? ?BMET ?Recent Labs  ?  12/16/2021 ?0308 12/11/21 ?0336  ?NA 138 138  ?K 4.8 4.6  ?CL 104 106  ?CO2 28 25  ?GLUCOSE 157* 187*  ?BUN 22 16  ?CREATININE 1.00 1.03*  ?CALCIUM 8.3* 7.7*  ? ?INR  ?Date Value Ref Range Status  ?04/02/2021 1.0 0.8 - 1.2 Final  ?  Comment:  ?  (NOTE) ?INR goal varies based on device and disease states. ?Performed at [facility], [address] [place], [place] ?[address] ?  ? ? ? ?Exam ? ?Right hip and thigh dressings are clean and dry.  Large abdominal panniculus overlying region of candidiasis in intertriginous folds, abundant powder placed for skin care.  Neurovascular intact distally in the right lower extremity although she does complain significant spasms. ? ?Sling to right upper extremity has been displaced and removed at this time.  Patient may position the right arm for comfort while in bed.  Recommend reapplication of the sling when she is attempting transfers and ambulation. ? ?Assessment: ? ?1.  Status post intramedullary nailing right intertrochanteric hip fracture ? ?2.  Right clavicle fracture, conservative management ? ?Plan ?Discussed with patient and family members treatment plan.  She is cleared for weightbearing as tolerated to the right lower extremity.  Clavicle fracture will require  nonweightbearing to the right upper extremity. ? ?Poorly controlled pain.  Intolerant to codeine derivatives.  Recommend trial of Dilaudid 2 mg p.o. every 4 hours.  We will also encourage around-the-clock use of her previously prescribed Robaxin. ?[name] [name] M [name] [name] ?12/11/2021, 9:59 AM ? ? Contact # [phone] ?  ?

## 2021-12-11 NOTE — Plan of Care (Signed)

## 2021-12-11 NOTE — Progress Notes (Signed)
PT Cancellation Note ? ?Patient Details ?Name: [name] [name] ?MRN: [id number] ?DOB: 02-06-1946 ? ? ?Cancelled Treatment:    Reason Eval/Treat Not Completed: Pain limiting ability to participate. Pt/family report a very rough morning with pain. Will try again later.  ? ? ?Shary Decamp [facility] ?12/11/2021, 11:32 AM ?[facility] PT ?Acute Rehabilitation Services ?Pager [phone] ?Office [phone] ? ?

## 2021-12-11 NOTE — Progress Notes (Addendum)
ANTICOAGULATION CONSULT NOTE - Initial Consult ? ?Pharmacy Consult for heparin ?Indication: chest pain/ACS ? ?Allergies  ?Allergen Reactions  ? Doxycycline Nausea Only and Other (See Comments)  ?  GI upset ?  ? Oxycontin [Oxycodone] Other (See Comments)  ?  Overly-sedating, causes hypotension, decreased oxgyen  ? Percocet [Oxycodone-Acetaminophen] Other (See Comments)  ?  Overly-sedating, causes hypotension, decreased oxgyen  ? ? ?Patient Measurements: ?Height: 4\' 9"  (144.8 cm) ?Weight: 85.3 kg (188 lb 0.8 oz) ?IBW/kg (Calculated) : 38.6 ?Heparin Dosing Weight: 59.4 kg  ? ?Vital Signs: ?Temp: 99.8 ?F (37.7 ?C) (03/18 1640) ?Temp Source: Oral (03/18 1640) ?BP: 120/63 (03/18 1640) ?Pulse Rate: 103 (03/18 1337) ? ?Labs: ?Recent Labs  ?  12/24/2021 ?1830 12/14/2021 ?0308 12/11/21 ?[id number] 12/11/21 ?1533  ?HGB 12.4 9.8* 6.9* 7.9*  ?HCT 43.2 31.9* 21.5* 25.2*  ?PLT 186 172 109*  --   ?CREATININE 1.03* 1.00 1.03*  --   ?TROPONINIHS  --   --   --  3,568*  ? ? ?Estimated Creatinine Clearance: 42.7 mL/min (A) (by C-G formula based on SCr of 1.03 mg/dL (H)). ? ? ?Medical History: ?Past Medical History:  ?Diagnosis Date  ? (HFpEF) heart failure with preserved ejection fraction ([place])   ? a. 11/2019 Echo: EF 60-65%, no rwma. Mild LVH. Mild AI, mild to mod AS.  ? Aneurysm ([place])   ? She had 2.6 cm dilation of the infrarenal abdominal aorta on 08/01/15 CTA with 5 year [place] recommended  ? Anxiety   ? Anxiety and depression   ? Aortic stenosis   ? mild-moderate AS 12/23/19 echo  ? Carotid stenosis   ? a. 06/2020 Carotid/Cerebral angio: LICA 70, L Vert 50, RCCA 50-60d, RICA 60-70p, 3.3x2.3 RICA aneurysm, RMCA M1 50-70.  ? CHF (congestive heart failure) ([place])   ? Compression fracture of body of thoracic vertebra (HCC)   ? T 10  ? Concussion 08/03/2015  ? COPD (chronic obstructive pulmonary disease) ([place]) 12/2012  ? spirometry: Pre: FVC 84%, FEV1 69%, ratio 0.64 consistent with moderate obstruction.  ? Depression   ? Fall 08/03/2015  ? d/c home health  08/2015  ? Fracture of cervical vertebra, C5 (HCC) 08/06/2015  ? History of chicken pox   ? History of stress test   ? a. 01/2019 MV: No isch/infarct. EF 71%.  ? Hyperlipidemia   ? Hypertension   ? Hypothyroidism   ? Lower back pain   ? h/o HNP s/p surgery  ? Neuropathy   ? B/L feet  ? On supplemental oxygen by nasal cannula   ? at HS and PRN during the day  ? Osteoarthritis   ? h/o ruptured disc s/p ESI  ? Osteoporosis 11/2010  ? DEXA -2.7 spine, thoracic compression fracture  ? Peripheral vascular disease ([place])   ? Smoker   ? quit 10/2012  ? Stroke [facility]) 2010  ? x3 with residual R hemiparesis, s/p R MCA balloon angioplasty (2010)  ? Wears dentures   ? upper  ? ? ?Medications:  ?Scheduled:  ? aspirin  162 mg Oral Once  ? aspirin  81 mg Oral BID WC  ? atorvastatin  40 mg Oral Daily  ? docusate sodium  100 mg Oral BID  ? ezetimibe  10 mg Oral QPM  ? feeding supplement  237 mL Oral BID BM  ? fluconazole  150 mg Oral Daily  ? levothyroxine  50 mcg Oral Q0600  ? lidocaine  1 patch Transdermal Q24H  ? metoprolol tartrate  12.5 mg Oral  BID  ? mirabegron ER  50 mg Oral Daily  ? mometasone-formoterol  2 puff Inhalation BID  ? multivitamin with minerals  1 tablet Oral Daily  ? nystatin cream   Topical BID  ? senna  1 tablet Oral BID  ? sertraline  100 mg Oral Daily  ? ? ?Assessment: ?22 yof who presented after mechanical fall from home - underwent closed ORIF for R intertrochanteric fx on 3/17. Now having CP. No AC PTA.  ? ?Hgb 6.9>7.9, plt 109. Trop [id number]. No s/sx of bleeding.  ? ?Goal of Therapy:  ?Heparin level 0.3-0.7 units/ml ?Monitor platelets by anticoagulation protocol: Yes ?  ?Plan:  ?Will hold on bolus with low Hgb and recent surgery ?Start heparin infusion at 750 units/hr ?Order heparin level in 8 hours ?Monitor daily HL, CBC, and for s/sx of bleeding  ? ?Antonietta Jewel, PharmD, BCCCP ?Clinical Pharmacist  ?Phone: [phone] ?12/11/2021 5:58 PM ? ?Please check AMION for all [place] phone numbers ?After 10:00 PM, call  [place] [phone] ? ? ? ?

## 2021-12-11 NOTE — Progress Notes (Addendum)
Date and time results received: 12/11/21 15628 ?(use smartphrase ".now" to insert current time) ? ?Test: Troponin 1 (High sensitivity) ?Critical Value: 3,568 ? ?Name of Provider Notified: Dr Elvera Lennox via amion at [phone] ? ?Orders Received? Or Actions Taken?: no orders given, the plan is to consult cardiology ?

## 2021-12-11 NOTE — Progress Notes (Signed)
OT Cancellation Note ? ?Patient Details ?Name: [name] [name] ?MRN: [id number] ?DOB: 06-28-1946 ? ? ?Cancelled Treatment:    Reason Eval/Treat Not Completed: Medical issues which prohibited therapy. Patient needing blood transfusion and in too much pain to participate in mobility today. Will check back 3/19 ? ?Marlyce Huge OT ?OT pager: [phone] ? ? ?Carmelia Roller ?12/11/2021, 1:17 PM ?

## 2021-12-11 NOTE — Progress Notes (Signed)
?PROGRESS NOTE ? ?[name] [name] [id number] DOB: [birth year] DOA: 11/24/2021 ?PCP: Ria Bush, MD ? ? LOS: 2 days  ? ?Brief Narrative / Interim history: ?77 year old female with obesity, HTN, PVD, CKD 3A, comes into the hospital after having a fall at home.  She was walking without a walker although she is supposed to use 1, tripped up on her dogs and fell onto her right side/backside.  She experienced pain in the right hip as well as right shoulder, was brought to the ER and found to have right clavicle fracture as well as right proximal femur fracture.  Ortho consulted.  ? ?Subjective / 24h Interval events: ?Complains of chest pain, feels like a pulled muscle, on and off.  Complains of severe hip pain ? ?Assesement and Plan: ?Principal Problem: ?  Closed intertrochanteric fracture of hip, right, initial encounter ([place]) ?Active Problems: ?  Right clavicle fracture ?  Hypertension ?  Peripheral vascular disease ([place]) ?  COPD (chronic obstructive pulmonary disease) ([place]) ?  MDD (major depressive disorder), recurrent episode, moderate ([place]) ?  Osteoporosis ?  Hypothyroidism (acquired) ?  Chronic diastolic heart failure ([place]) ?  Intertrigo ?  Stage 3a chronic kidney disease (CKD) ([place]) ?  Neuropathy ? ? ?Assessment and Plan: ?Principal problem ?Closed intertrochanteric fracture of hip, right -orthopedic surgery consulted, appreciate input.  She was taken to the OR on 3/17 by Dr. Lyla Glassing and she is status post intramedullary fixation of the right femur.  Weightbearing as tolerated, PT eval pending.  Pain management is an issue as she is intolerant of a lot of narcotics, however she is in severe pain this morning.  Discussed with the daughters at bedside, will try IV Dilaudid ? ?Active problems ?Right clavicle fracture -Management per ortho. Right arm sling.  Nonweightbearing on the right arm ? ?Chest pain-had a little chest pain last night as well as this morning.  Cycle cardiac enzymes, obtain EKG ? ?Stage  3a chronic kidney disease (CKD) (H[facility]) -Baseline creatinine 0.9-1.2, remains at baseline this morning ? ?Anemia of chronic disease, acute blood loss anemia-postoperatively hemoglobin is 6.9 this morning.  Transfuse unit of packed red blood cells. ? ?Thrombocytopenia-likely consumptive, monitor ? ?Intertrigo -continue nystatin, changed to cream today ? ?Chronic diastolic heart failure (H[facility]) -Stable. On lasix. Euvolemic. ? ?Hypothyroidism-Continue synthroid. ? ?Osteoporosis -On fosamax at home. ? ?Depression-Continue zoloft ? ?Hyperlipidemia-continue statin, Zetia ? ?COPD (chronic obstructive pulmonary disease) (H[facility]) -stable, no wheezing, appears at baseline. ? ?Peripheral vascular disease ([place])- Hold aspirin until postop ? ?Hypertension -Stable. Continue home meds. ? ?Scheduled Meds: ? aspirin  81 mg Oral BID WC  ? atorvastatin  40 mg Oral Daily  ? docusate sodium  100 mg Oral BID  ? ezetimibe  10 mg Oral QPM  ? feeding supplement  237 mL Oral BID BM  ? fluconazole  150 mg Oral Daily  ? levothyroxine  50 mcg Oral Q0600  ? lidocaine  1 patch Transdermal Q24H  ? mirabegron ER  50 mg Oral Daily  ? mometasone-formoterol  2 puff Inhalation BID  ? multivitamin with minerals  1 tablet Oral Daily  ? nystatin cream   Topical BID  ? senna  1 tablet Oral BID  ? sertraline  100 mg Oral Daily  ? ?Continuous Infusions: ? lactated ringers 75 mL/hr at 12/09/2021 2346  ? methocarbamol (ROBAXIN) IV    ? ?PRN Meds:.acetaminophen, bisacodyl, calcium carbonate, HYDROmorphone, ketorolac, melatonin, menthol-cetylpyridinium **OR** phenol, methocarbamol **OR** methocarbamol (ROBAXIN) IV, metoCLOPramide **OR** metoCLOPramide (REGLAN) injection, ondansetron **OR**  ondansetron (ZOFRAN) IV, polyethylene glycol, traMADol ? ?Diet Orders (From admission, onward)  ? ?  Start     Ordered  ? 12/19/2021 2229  Diet regular Room service appropriate? Yes; Fluid consistency: Thin  Diet effective now       ?Question Answer Comment  ?Room service appropriate? Yes    ?Fluid consistency: Thin   ?  ? 12/18/2021 2229  ? ?  ?  ? ?  ? ?DVT prophylaxis: SCDs Start: 12/04/2021 2228 ? ? ?Lab Results  ?Component Value Date  ? PLT 109 (L) 12/11/2021  ? ? ?  Code Status: Full Code ? ?Family Communication: Daughter was present at bedside ? ?Status is: Inpatient ? ?Remains inpatient appropriate because: surgery today ? ?Level of care: Med-Surg ? ?Consultants:  ?Orthopedic surgery  ? ?Procedures:  ?none ? ?Microbiology  ?none ? ?Antimicrobials: ?none  ? ? ?Objective: ?Vitals:  ? 12/11/21 0600 12/11/21 0800 12/11/21 0951 12/11/21 1044  ?BP: 117/65 126/78 120/71 138/69  ?Pulse: 88  (!) 113 (!) 107  ?Resp:   18 18  ?Temp:  98.8 ?F (37.1 ?C) 98.8 ?F (37.1 ?C) 98.3 ?F (36.8 ?C)  ?TempSrc:  Oral Oral Oral  ?SpO2: 97% 96% 96% 96%  ?Weight:      ?Height:      ? ? ?Intake/Output Summary (Last 24 hours) at 12/11/2021 1131 ?Last data filed at 12/11/2021 0400 ?Gross per 24 hour  ?Intake 1823.64 ml  ?Output 250 ml  ?Net 1573.64 ml  ? ? ?Wt Readings from Last 3 Encounters:  ?12/01/2021 85.3 kg  ?11/16/21 83.9 kg  ?09/28/21 81.8 kg  ? ? ?Examination: ?Constitutional: NAD ?Eyes: lids and conjunctivae normal, no scleral icterus ?ENMT: mmm ?Neck: normal, supple ?Respiratory: clear to auscultation bilaterally, no wheezing, no crackles.  ?Cardiovascular: Regular rate and rhythm, no murmurs / rubs / gallops. No LE edema. ?Abdomen: soft, no distention, no tenderness. Bowel sounds positive.  ?Skin: no rashes ?Neurologic: no focal deficits, equal strength ? ?Data Reviewed: I have independently reviewed following labs and imaging studies ? ?CBC ?Recent Labs  ?Lab 12/20/2021 ?1830 12/02/2021 ?0308 12/11/21 ?[id number]  ?WBC 10.9* 10.7* 10.9*  ?HGB 12.4 9.8* 6.9*  ?HCT 43.2 31.9* 21.5*  ?PLT 186 172 109*  ?MCV 105.6* 97.3 97.7  ?MCH 30.3 29.9 31.4  ?MCHC 28.7* 30.7 32.1  ?RDW 13.1 13.3 13.5  ?LYMPHSABS 2.6 1.1  --   ?MONOABS 0.9 0.8  --   ?EOSABS 0.3 0.0  --   ?BASOSABS 0.1 0.1  --   ? ? ? ?Recent Labs  ?Lab 11/25/2021 ?1830  11/26/2021 ?0308 12/11/21 ?[id number]  ?NA 138 138 138  ?K 4.4 4.8 4.6  ?CL 103 104 106  ?CO2 24 28 25   ?GLUCOSE 123* 157* 187*  ?BUN 25* 22 16  ?CREATININE 1.03* 1.00 1.03*  ?CALCIUM 8.7* 8.3* 7.7*  ?AST 30 20 25   ?ALT 13 15 14   ?ALKPHOS 120 99 74  ?BILITOT 0.8 0.5 0.3  ?ALBUMIN 3.9 3.4* 3.0*  ?MG  --  1.9  --   ? ? ? ?------------------------------------------------------------------------------------------------------------------ ?No results for input(s): CHOL, HDL, LDLCALC, TRIG, CHOLHDL, LDLDIRECT in the last 72 hours. ? ?Lab Results  ?Component Value Date  ? HGBA1C 5.1 07/31/2018  ? ?------------------------------------------------------------------------------------------------------------------ ?No results for input(s): TSH, T4TOTAL, T3FREE, THYROIDAB in the last 72 hours. ? ?Invalid input(s): FREET3 ? ?Cardiac Enzymes ?No results for input(s): CKMB, TROPONINI, MYOGLOBIN in the last 168 hours. ? ?Invalid input(s): CK ?------------------------------------------------------------------------------------------------------------------ ?   ?Component Value Date/Time  ? BNP 70.2  09/14/2021 1811  ? ? ?CBG: ?No results for input(s): GLUCAP in the last 168 hours. ? ?Recent Results (from the past 240 hour(s))  ?MRSA Next Gen by PCR, Nasal     Status: None  ? Collection Time: 12/17/2021 12:29 AM  ? Specimen: Nasal Mucosa; Nasal Swab  ?Result Value Ref Range Status  ? MRSA by PCR Next Gen NOT DETECTED NOT DETECTED Final  ?  Comment: (NOTE) ?The GeneXpert MRSA Assay (FDA approved for NASAL specimens only), ?is one component of a comprehensive MRSA colonization surveillance ?program. It is not intended to diagnose MRSA infection nor to guide ?or monitor treatment for MRSA infections. ?Test performance is not FDA approved in patients less than 2 years ?old. ?Performed at [facility], [place] [address] [place], [place] ?[address] ?  ?  ? ?Radiology Studies: ?CT SHOULDER RIGHT WO CONTRAST ? ?Result Date: 12/12/2021 ?CLINICAL DATA:   Shoulder trauma, fracture of humerus or scapula eval medial 3rd clavicle fx EXAM: CT OF THE UPPER RIGHT EXTREMITY WITHOUT CONTRAST TECHNIQUE: Multidetector CT imaging of the upper right extremity was perform

## 2021-12-12 ENCOUNTER — Inpatient Hospital Stay (HOSPITAL_COMMUNITY): Admission: EM | Disposition: E | Payer: Self-pay | Source: Home / Self Care | Attending: Internal Medicine

## 2021-12-12 ENCOUNTER — Inpatient Hospital Stay (HOSPITAL_COMMUNITY): Payer: Medicare HMO

## 2021-12-12 ENCOUNTER — Other Ambulatory Visit (HOSPITAL_COMMUNITY): Payer: Medicare HMO

## 2021-12-12 DIAGNOSIS — I2510 Atherosclerotic heart disease of native coronary artery without angina pectoris: Secondary | ICD-10-CM | POA: Diagnosis not present

## 2021-12-12 DIAGNOSIS — I214 Non-ST elevation (NSTEMI) myocardial infarction: Secondary | ICD-10-CM | POA: Diagnosis not present

## 2021-12-12 DIAGNOSIS — I5032 Chronic diastolic (congestive) heart failure: Secondary | ICD-10-CM | POA: Diagnosis not present

## 2021-12-12 DIAGNOSIS — J439 Emphysema, unspecified: Secondary | ICD-10-CM | POA: Diagnosis not present

## 2021-12-12 DIAGNOSIS — S72141A Displaced intertrochanteric fracture of right femur, initial encounter for closed fracture: Secondary | ICD-10-CM | POA: Diagnosis not present

## 2021-12-12 HISTORY — PX: LEFT HEART CATH AND CORONARY ANGIOGRAPHY: CATH118249

## 2021-12-12 LAB — BASIC METABOLIC PANEL
Anion gap: 6 (ref 5–15)
BUN: 14 mg/dL (ref 8–23)
CO2: 29 mmol/L (ref 22–32)
Calcium: 8.4 mg/dL — ABNORMAL LOW (ref 8.9–10.3)
Chloride: 103 mmol/L (ref 98–111)
Creatinine, Ser: 0.78 mg/dL (ref 0.44–1.00)
GFR, Estimated: >60 mL/min (ref >60)
Glucose, Bld: 132 mg/dL — ABNORMAL HIGH (ref 70–99)
Potassium: 4.7 mmol/L (ref 3.5–5.1)
Sodium: 138 mmol/L (ref 135–145)

## 2021-12-12 LAB — HEPARIN LEVEL (UNFRACTIONATED)
Heparin Unfractionated: 0.19 IU/mL — ABNORMAL LOW (ref 0.30–0.70)
Heparin Unfractionated: 0.43 IU/mL (ref 0.30–0.70)

## 2021-12-12 LAB — TYPE AND SCREEN
ABO/RH(D): B POS
Antibody Screen: NEGATIVE
Unit division: 0

## 2021-12-12 LAB — BPAM RBC
Blood Product Expiration Date: 202304142359
ISSUE DATE / TIME: 202303181013
Unit Type and Rh: 7300

## 2021-12-12 LAB — CBC
HCT: 27.9 % — ABNORMAL LOW (ref 36.0–46.0)
Hemoglobin: 8.7 g/dL — ABNORMAL LOW (ref 12.0–15.0)
MCH: 29.6 pg (ref 26.0–34.0)
MCHC: 31.2 g/dL (ref 30.0–36.0)
MCV: 94.9 fL (ref 80.0–100.0)
Platelets: 132 10*3/uL — ABNORMAL LOW (ref 150–400)
RBC: 2.94 MIL/uL — ABNORMAL LOW (ref 3.87–5.11)
RDW: 16.4 % — ABNORMAL HIGH (ref 11.5–15.5)
WBC: 13 10*3/uL — ABNORMAL HIGH (ref 4.0–10.5)
nRBC: 0.2 % (ref 0.0–0.2)

## 2021-12-12 LAB — GLUCOSE, CAPILLARY: Glucose-Capillary: 128 mg/dL — ABNORMAL HIGH (ref 70–99)

## 2021-12-12 LAB — TROPONIN I (HIGH SENSITIVITY): Troponin I (High Sensitivity): 13253 ng/L (ref <18)

## 2021-12-12 LAB — LIPASE, BLOOD: Lipase: 31 U/L (ref 11–51)

## 2021-12-12 SURGERY — LEFT HEART CATH AND CORONARY ANGIOGRAPHY
Anesthesia: LOCAL

## 2021-12-12 MED ORDER — FUROSEMIDE 10 MG/ML IJ SOLN
20.0000 mg | Freq: Every day | INTRAMUSCULAR | Status: DC
  Administered 2021-12-12 – 2021-12-13 (×2): 20 mg via INTRAVENOUS
  Filled 2021-12-12 (×2): qty 2

## 2021-12-12 MED ORDER — SODIUM CHLORIDE 0.9 % IV BOLUS
250.0000 mL | Freq: Once | INTRAVENOUS | Status: AC
  Administered 2021-12-13: 250 mL via INTRAVENOUS

## 2021-12-12 MED ORDER — HEPARIN (PORCINE) IN NACL 1000-0.9 UT/500ML-% IV SOLN
INTRAVENOUS | Status: AC
  Filled 2021-12-12: qty 500

## 2021-12-12 MED ORDER — SODIUM CHLORIDE 0.9% FLUSH: 3.0000 mL | INTRAVENOUS | Status: DC | PRN

## 2021-12-12 MED ORDER — GLYCERIN (LAXATIVE) 2 G RE SUPP
1.0000 | Freq: Every day | RECTAL | Status: DC | PRN
  Filled 2021-12-12: qty 1

## 2021-12-12 MED ORDER — LIDOCAINE HCL (PF) 1 % IJ SOLN
INTRAMUSCULAR | Status: AC
  Filled 2021-12-12: qty 30

## 2021-12-12 MED ORDER — SODIUM CHLORIDE 0.9 % IV SOLN
250.0000 mL | INTRAVENOUS | Status: DC | PRN
  Administered 2021-12-13: 250 mL via INTRAVENOUS

## 2021-12-12 MED ORDER — SENNOSIDES-DOCUSATE SODIUM 8.6-50 MG PO TABS
2.0000 | ORAL_TABLET | Freq: Two times a day (BID) | ORAL | Status: DC
  Administered 2021-12-12 – 2021-12-18 (×11): 2 via ORAL
  Filled 2021-12-12 (×15): qty 2

## 2021-12-12 MED ORDER — CHLORHEXIDINE GLUCONATE CLOTH 2 % EX PADS
6.0000 | MEDICATED_PAD | Freq: Every day | CUTANEOUS | Status: DC
  Administered 2021-12-12 – 2021-12-20 (×6): 6 via TOPICAL

## 2021-12-12 MED ORDER — HYDRALAZINE HCL 20 MG/ML IJ SOLN: 10.0000 mg | INTRAMUSCULAR | Status: AC | PRN

## 2021-12-12 MED ORDER — ASPIRIN 81 MG PO CHEW
81.0000 mg | CHEWABLE_TABLET | Freq: Every day | ORAL | Status: DC
  Filled 2021-12-12: qty 1

## 2021-12-12 MED ORDER — POLYETHYLENE GLYCOL 3350 17 G PO PACK
17.0000 g | PACK | Freq: Every day | ORAL | Status: DC
  Administered 2021-12-12 – 2021-12-17 (×5): 17 g via ORAL
  Filled 2021-12-12 (×6): qty 1

## 2021-12-12 MED ORDER — IPRATROPIUM-ALBUTEROL 0.5-2.5 (3) MG/3ML IN SOLN
3.0000 mL | Freq: Four times a day (QID) | RESPIRATORY_TRACT | Status: DC | PRN
  Administered 2021-12-12 – 2021-12-19 (×5): 3 mL via RESPIRATORY_TRACT
  Filled 2021-12-12 (×4): qty 3
  Filled 2021-12-12: qty 9

## 2021-12-12 MED ORDER — SODIUM CHLORIDE 0.9% FLUSH
3.0000 mL | Freq: Two times a day (BID) | INTRAVENOUS | Status: DC
  Administered 2021-12-13 (×3): 3 mL via INTRAVENOUS

## 2021-12-12 MED ORDER — HEPARIN (PORCINE) IN NACL 1000-0.9 UT/500ML-% IV SOLN
INTRAVENOUS | Status: DC | PRN
  Administered 2021-12-12 (×2): 500 mL

## 2021-12-12 MED ORDER — LIDOCAINE HCL (PF) 1 % IJ SOLN
INTRAMUSCULAR | Status: DC | PRN
  Administered 2021-12-12: 15 mL

## 2021-12-12 MED ORDER — SODIUM CHLORIDE 0.9 % WEIGHT BASED INFUSION: 1.0000 mL/kg/h | INTRAVENOUS | Status: AC

## 2021-12-12 MED ORDER — HEPARIN SODIUM (PORCINE) 1000 UNIT/ML IJ SOLN
INTRAMUSCULAR | Status: AC
  Filled 2021-12-12: qty 10

## 2021-12-12 MED ORDER — VERAPAMIL HCL 2.5 MG/ML IV SOLN
INTRAVENOUS | Status: AC
  Filled 2021-12-12: qty 2

## 2021-12-12 MED ORDER — CLOPIDOGREL BISULFATE 75 MG PO TABS
75.0000 mg | ORAL_TABLET | Freq: Every day | ORAL | Status: DC
  Administered 2021-12-12: 75 mg via ORAL
  Filled 2021-12-12 (×2): qty 1

## 2021-12-12 MED ORDER — NITROGLYCERIN 0.4 MG SL SUBL: 0.4000 mg | SUBLINGUAL_TABLET | SUBLINGUAL | Status: DC | PRN

## 2021-12-12 MED ORDER — HYDROMORPHONE HCL 1 MG/ML IJ SOLN
0.5000 mg | INTRAMUSCULAR | Status: DC | PRN
  Administered 2021-12-12 (×2): 0.5 mg via INTRAVENOUS
  Administered 2021-12-13: 0.1 mg via INTRAVENOUS
  Administered 2021-12-13: 0.5 mg via INTRAVENOUS
  Administered 2021-12-13 – 2021-12-14 (×5): 0.1 mg via INTRAVENOUS
  Filled 2021-12-12 (×8): qty 0.5

## 2021-12-12 MED ORDER — HEPARIN (PORCINE) 25000 UT/250ML-% IV SOLN
1000.0000 [IU]/h | INTRAVENOUS | Status: DC
  Administered 2021-12-13: 1000 [IU]/h via INTRAVENOUS
  Filled 2021-12-12 (×2): qty 250

## 2021-12-12 SURGICAL SUPPLY — 12 items
CATH INFINITI 5 FR JL3.5 (CATHETERS) ×1 IMPLANT
CATH INFINITI 5FR JL4 (CATHETERS) ×1 IMPLANT
CATH INFINITI JR4 5F (CATHETERS) ×1 IMPLANT
ELECT DEFIB PAD ADLT CADENCE (PAD) ×1 IMPLANT
KIT ENCORE 26 ADVANTAGE (KITS) ×1 IMPLANT
KIT HEART LEFT (KITS) ×2 IMPLANT
PACK CARDIAC CATHETERIZATION (CUSTOM PROCEDURE TRAY) ×2 IMPLANT
SHEATH PINNACLE 6F 10CM (SHEATH) ×1 IMPLANT
SHEATH PROBE COVER 6X72 (BAG) ×1 IMPLANT
TRANSDUCER W/STOPCOCK (MISCELLANEOUS) ×2 IMPLANT
TUBING CIL FLEX 10 FLL-RA (TUBING) ×3 IMPLANT
WIRE EMERALD 3MM-J .035X150CM (WIRE) ×1 IMPLANT

## 2021-12-12 NOTE — Interval H&P Note (Signed)
History and Physical Interval Note: ? ?12/22/2021 ?2:18 PM ? ?[name] [name]  has presented today for surgery, with the diagnosis of STEMI.  The various methods of treatment have been discussed with the patient and family. After consideration of risks, benefits and other options for treatment, the patient has consented to  Procedure(s): ?Coronary/Graft Acute MI Revascularization (N/A) ?LEFT HEART CATH AND CORONARY ANGIOGRAPHY (N/A) as a surgical intervention.  The patient's history has been reviewed, patient examined, no change in status, stable for surgery.  I have reviewed the patient's chart and labs.  Questions were answered to the patient's satisfaction.   ?Cath Lab Visit (complete for each Cath Lab visit) ? ?Clinical Evaluation Leading to the Procedure:  ? ?ACS: Yes.   ? ?Non-ACS:   ? ?Anginal Classification: CCS IV ? ?Anti-ischemic medical therapy: Minimal Therapy (1 class of medications) ? ?Non-Invasive Test Results: No non-invasive testing performed ? ?Prior CABG: No previous CABG ? ? ? ? ? ? ? ?[name] ?12/14/2021 ?2:18 PM ? ? ? ?

## 2021-12-12 NOTE — H&P (View-Only) (Signed)
? ?Progress Note ? ?Patient Name: [name] [name] ?Date of Encounter: 12/14/2021 ? ?[facility] Cardiologist: Ida Rogue, MD  ? ?Subjective  ? ?[name] [name] is drowsy this AM. Had some RLQ discomfort. No persistent chest pressure. Family by the bedside ? ?Troponin [phone] ? ?Inpatient Medications  ?  ?Scheduled Meds: ? aspirin  81 mg Oral BID WC  ? atorvastatin  40 mg Oral Daily  ? docusate sodium  100 mg Oral BID  ? ezetimibe  10 mg Oral QPM  ? feeding supplement  237 mL Oral BID BM  ? fluconazole  150 mg Oral Daily  ? levothyroxine  50 mcg Oral Q0600  ? lidocaine  1 patch Transdermal Q24H  ? metoprolol tartrate  12.5 mg Oral BID  ? mirabegron ER  50 mg Oral Daily  ? mometasone-formoterol  2 puff Inhalation BID  ? multivitamin with minerals  1 tablet Oral Daily  ? nystatin cream   Topical BID  ? polyethylene glycol  17 g Oral Daily  ? senna-docusate  2 tablet Oral BID  ? sertraline  100 mg Oral Daily  ? ?Continuous Infusions: ? heparin 1,000 Units/hr (12/03/2021 0452)  ? methocarbamol (ROBAXIN) IV    ? ?PRN Meds: ?acetaminophen, bisacodyl, calcium carbonate, Glycerin (Adult), HYDROmorphone (DILAUDID) injection, ketorolac, melatonin, menthol-cetylpyridinium **OR** phenol, methocarbamol **OR** methocarbamol (ROBAXIN) IV, metoCLOPramide **OR** metoCLOPramide (REGLAN) injection, ondansetron **OR** ondansetron (ZOFRAN) IV, traMADol  ? ?Vital Signs  ?  ?Vitals:  ? 12/11/21 2034 12/11/21 2355 12/20/2021 0600 11/30/2021 0816  ?BP: (!) 106/55 100/68 108/73 (!) 108/55  ?Pulse: 97 87 94 99  ?Resp: 19 14 14 16   ?Temp: 98.1 ?F (36.7 ?C) 98.3 ?F (36.8 ?C) 98.5 ?F (36.9 ?C) 98.5 ?F (36.9 ?C)  ?TempSrc: Oral Axillary Axillary Oral  ?SpO2: 98% 93% 100% 96%  ?Weight:      ?Height:      ? ? ?Intake/Output Summary (Last 24 hours) at 12/09/2021 0952 ?Last data filed at 12/11/2021 2200 ?Gross per 24 hour  ?Intake 380 ml  ?Output 700 ml  ?Net -320 ml  ? ?Last 3 Weights 12/07/2021 11/16/2021 09/28/2021  ?Weight (lbs) 188 lb 0.8 oz  185 lb 180 lb 6 oz  ?Weight (kg) 85.3 kg 83.915 kg 81.818 kg  ?   ? ?Telemetry  ?  ?NSR, minimal ventricular ectopy - Personally Reviewed ? ?ECG  ?  ?No new - Personally Reviewed ? ?Physical Exam  ? ?Vitals:  ? 12/13/2021 0600 12/09/2021 0816  ?BP: 108/73 (!) 108/55  ?Pulse: 94 99  ?Resp: 14 16  ?Temp: 98.5 ?F (36.9 ?C) 98.5 ?F (36.9 ?C)  ?SpO2: 100% 96%  ? ? ?GEN: drowsy   ?Neck: No sig JVD ?Cardiac: RRR, no murmurs, rubs, or gallops.  ?Respiratory: nl wob, light crackles ?GI: Soft, nontender, non-distended  ?MS: No edema; No deformity. ?Neuro:  Nonfocal  ?Psych: Normal affect  ? ?Labs  ?  ?High Sensitivity Troponin:   ?Recent Labs  ?Lab 12/11/21 ?1533 12/11/21 ?1839 12/11/21 ?2039  ?TROPONINIHS [id number]* [id number]*  ?   ?Chemistry ?Recent Labs  ?Lab 11/26/2021 ?1830 12/17/2021 ?0308 12/11/21 ?[id number] 12/20/2021 ?0202  ?NA 138 138 138 138  ?K 4.4 4.8 4.6 4.7  ?CL 103 104 106 103  ?CO2 24 28 25 29   ?GLUCOSE 123* 157* 187* 132*  ?BUN 25* 22 16 14   ?CREATININE 1.03* 1.00 1.03* 0.78  ?CALCIUM 8.7* 8.3* 7.7* 8.4*  ?MG  --  1.9  --   --   ?PROT 7.2 6.4* 5.4*  --   ?  ALBUMIN 3.9 3.4* 3.0*  --   ?AST 30 20 25   --   ?ALT 13 15 14   --   ?ALKPHOS 120 99 74  --   ?BILITOT 0.8 0.5 0.3  --   ?GFRNONAA 57* 59* 57* >60  ?ANIONGAP 11 6 7 6   ?  ?Lipids No results for input(s): CHOL, TRIG, HDL, LABVLDL, LDLCALC, CHOLHDL in the last 168 hours.  ?Hematology ?Recent Labs  ?Lab 12/18/2021 ?0308 12/11/21 ?0336 12/11/21 ?1533 12/19/2021 ?0202  ?WBC 10.7* 10.9*  --  13.0*  ?RBC 3.28* 2.20*  --  2.94*  ?HGB 9.8* 6.9* 7.9* 8.7*  ?HCT 31.9* 21.5* 25.2* 27.9*  ?MCV 97.3 97.7  --  94.9  ?MCH 29.9 31.4  --  29.6  ?MCHC 30.7 32.1  --  31.2  ?RDW 13.3 13.5  --  16.4*  ?PLT 172 109*  --  132*  ? ?Thyroid No results for input(s): TSH, FREET4 in the last 168 hours.  ?BNPNo results for input(s): BNP, PROBNP in the last 168 hours.  ?DDimer No results for input(s): DDIMER in the last 168 hours.  ? ?Radiology  ?  ?CT SHOULDER RIGHT WO CONTRAST ? ?Result Date:  12/01/2021 ?CLINICAL DATA:  Shoulder trauma, fracture of humerus or scapula eval medial 3rd clavicle fx EXAM: CT OF THE UPPER RIGHT EXTREMITY WITHOUT CONTRAST TECHNIQUE: Multidetector CT imaging of the upper right extremity was performed according to the standard protocol. RADIATION DOSE REDUCTION: This exam was performed according to the departmental dose-optimization program which includes automated exposure control, adjustment of the mA and/or kV according to patient size and/or use of iterative reconstruction technique. COMPARISON:  Radiograph 12/15/2021 FINDINGS: Bones/Joint/Cartilage There is an inferior displaced, comminuted acute medial clavicle fracture. Fracture extends in close proximity to the articular surface of the right [place] joint, which is asymmetrically widened in comparison to the left with a joint effusion. There is a nondisplaced right posterolateral fourth rib fracture. Unchanged sclerosis of the inferior aspect of the humeral head consistent with chronic infarct. Curvilinear subchondral sclerosis along the medial humeral head, possibly subchondral insufficiency fracture or osteochondral abnormality (series 7, image 30), which could be further evaluated with MRI if clinically indicated. Ligaments Suboptimally assessed by CT. Muscles and Tendons No acute myotendinous abnormality on noncontrast CT. No significant muscle atrophy. Soft tissues There is extensive soft tissue swelling along the right lower neck/upper chest related to the clavicle fracture. No large hematoma. IMPRESSION: Displaced and comminuted acute medial clavicle fracture. Fracture extends in close proximity to the articular surface of the right sternoclavicular joint, which is asymmetrically widened in comparison to the left with a joint effusion. Nondisplaced right posterolateral fourth rib fracture. Chronic findings involving the right humeral head as described above. Electronically Signed   By: Maurine Simmering M.D.   On: 11/26/2021  15:19  ? ?Pelvis Portable ? ?Result Date: 12/06/2021 ?CLINICAL DATA:  Post right femur surgery EXAM: PORTABLE PELVIS 1-2 VIEWS COMPARISON:  Right hip radiographs dated 12/24/2021 at 1021 hours FINDINGS: Status post ORIF of the right hip. Intertrochanteric hip fracture fragments are mildly displaced but improved. Prior cancellous screw fixation of the left hip. Visualized bony pelvis appears intact. Prior vertebral augmentation at L5. IMPRESSION: Status post ORIF of the right hip. Electronically Signed   By: Julian Hy M.D.   On: 12/19/2021 21:29  ? ?DG Knee Right Port ? ?Result Date: 12/20/2021 ?CLINICAL DATA:  Trauma, fall EXAM: PORTABLE RIGHT KNEE - 1-2 VIEW COMPARISON:  None. FINDINGS: No recent fracture or dislocation  is seen in the right knee. There is no significant effusion. Minimal bony spurs seen. Osteopenia is seen in bony structures. Arterial calcifications are seen in the soft tissues. IMPRESSION: No fracture or dislocation is seen in the right knee. Electronically Signed   By: Elmer Picker M.D.   On: 12/06/2021 10:47  ? ?DG C-Arm 1-60 Min-No Report ? ?Result Date: 12/22/2021 ?Fluoroscopy was utilized by the requesting physician.  No radiographic interpretation.  ? ?DG HIP PORT UNILAT WITH PELVIS 1V RIGHT ? ?Result Date: 12/01/2021 ?CLINICAL DATA:  Trauma, fall EXAM: DG HIP (WITH OR WITHOUT PELVIS) 1V PORT RIGHT COMPARISON:  None. FINDINGS: Severely comminuted fracture is seen in the intertrochanteric portion of proximal right femur extending to the proximal shaft. There is displacement and overriding of fracture fragments. There is anterior angulation at the fracture site. IMPRESSION: Comminuted, displaced fracture is seen in the intertrochanteric portion of right femur extending to the proximal shaft. Electronically Signed   By: Elmer Picker M.D.   On: 12/02/2021 10:46  ? ?DG FEMUR, MIN 2 VIEWS RIGHT ? ?Result Date: 12/20/2021 ?CLINICAL DATA:  ORIF intramedullary nail right  intertrochanteric femur EXAM: RIGHT FEMUR 2 VIEWS COMPARISON:  Right hip radiographs 12/17/2021 FINDINGS: Images were performed intraoperatively without the presence of a radiologist. The patient is undergoing right femoral int

## 2021-12-12 NOTE — Evaluation (Signed)
Clinical/Bedside Swallow Evaluation ?Patient Details  ?Name: [name] [name] ?MRN: [id number] ?Date of Birth: 09/14/1946 ? ?Today's Date: 11/29/2021 ?Time: SLP Start Time (ACUTE ONLY): 1050 SLP Stop Time (ACUTE ONLY): 1111 ?SLP Time Calculation (min) (ACUTE ONLY): 21 min ? ?Past Medical History:  ?Past Medical History:  ?Diagnosis Date  ? (HFpEF) heart failure with preserved ejection fraction (HCC)   ? a. 11/2019 Echo: EF 60-65%, no rwma. Mild LVH. Mild AI, mild to mod AS.  ? Aneurysm (HCC)   ? She had 2.6 cm dilation of the infrarenal abdominal aorta on 08/01/15 CTA with 5 year [place] recommended  ? Anxiety   ? Anxiety and depression   ? Aortic stenosis   ? mild-moderate AS 12/23/19 echo  ? Carotid stenosis   ? a. 06/2020 Carotid/Cerebral angio: LICA 70, L Vert 50, RCCA 50-60d, RICA 60-70p, 3.3x2.3 RICA aneurysm, RMCA M1 50-70.  ? CHF (congestive heart failure) (HCC)   ? Compression fracture of body of thoracic vertebra (HCC)   ? T 10  ? Concussion 08/03/2015  ? COPD (chronic obstructive pulmonary disease) (HCC) 12/2012  ? spirometry: Pre: FVC 84%, FEV1 69%, ratio 0.64 consistent with moderate obstruction.  ? Depression   ? Fall 08/03/2015  ? d/c home health 08/2015  ? Fracture of cervical vertebra, C5 (HCC) 08/06/2015  ? History of chicken pox   ? History of stress test   ? a. 01/2019 MV: No isch/infarct. EF 71%.  ? Hyperlipidemia   ? Hypertension   ? Hypothyroidism   ? Lower back pain   ? h/o HNP s/p surgery  ? Neuropathy   ? B/L feet  ? On supplemental oxygen by nasal cannula   ? at HS and PRN during the day  ? Osteoarthritis   ? h/o ruptured disc s/p ESI  ? Osteoporosis 11/2010  ? DEXA -2.7 spine, thoracic compression fracture  ? Peripheral vascular disease (HCC)   ? Smoker   ? quit 10/2012  ? Stroke [facility]) 2010  ? x3 with residual R hemiparesis, s/p R MCA balloon angioplasty (2010)  ? Wears dentures   ? upper  ? ?Past Surgical History:  ?Past Surgical History:  ?Procedure Laterality Date  ? APPENDECTOMY  1960  ? CATARACT  EXTRACTION    ? bilateral  ? CESAREAN SECTION    ? CHOLECYSTECTOMY  1970  ? COLONOSCOPY  2004  ? diverticulosis, no polyps Jarold Motto)  ? COLONOSCOPY  05/2016  ? decreased sphincter tone, diverticulosis, no f/u recommended ([place])  ? DEXA  11/2010  ? T -2.7 spine, -1.9 hip  ? HIP SURGERY Left 2006  ? fractured - screws placed  ? IR ANGIO INTRA EXTRACRAN SEL COM CAROTID INNOMINATE BILAT MOD SED  08/01/2018  ? IR ANGIO INTRA EXTRACRAN SEL COM CAROTID INNOMINATE BILAT MOD SED  12/10/2018  ? IR ANGIO INTRA EXTRACRAN SEL COM CAROTID INNOMINATE BILAT MOD SED  06/26/2020  ? IR ANGIO INTRA EXTRACRAN SEL COM CAROTID INNOMINATE BILAT MOD SED  04/02/2021  ? IR ANGIO VERTEBRAL SEL SUBCLAVIAN INNOMINATE BILAT MOD SED  04/02/2021  ? IR ANGIO VERTEBRAL SEL SUBCLAVIAN INNOMINATE UNI L MOD SED  06/26/2020  ? IR ANGIO VERTEBRAL SEL SUBCLAVIAN INNOMINATE UNI R MOD SED  08/01/2018  ? IR ANGIO VERTEBRAL SEL SUBCLAVIAN INNOMINATE UNI R MOD SED  12/10/2018  ? IR ANGIO VERTEBRAL SEL VERTEBRAL UNI L MOD SED  12/10/2018  ? IR RADIOLOGIST EVAL & MGMT  01/09/2018  ? KYPHOPLASTY  10/02/2017  ? Procedure: LUMBAR FOUR KYPHOPLASTY;  Surgeon: Coletta Memos, MD;  Location: [facility] OR;  Service: Neurosurgery;;  ? KYPHOPLASTY N/A 01/29/2020  ? Procedure: Thoracic Ten Kyphoplasty;  Surgeon: Coletta Memos, MD;  Location: [facility] OR;  Service: Neurosurgery;  Laterality: N/A;  ? RADIOLOGY WITH ANESTHESIA N/A 11/11/2015  ? Procedure: RADIOLOGY WITH ANESTHESIA;  Surgeon: Julieanne Cotton, MD;  Location: [facility] OR;  Service: Radiology;  Laterality: N/A;  ? RADIOLOGY WITH ANESTHESIA N/A 12/10/2018  ? Procedure: STENTING;  Surgeon: Julieanne Cotton, MD;  Location: [facility] OR;  Service: Radiology;  Laterality: N/A;  ? ?HPI:  ?76 year old female admitted after falling and sustaining right clavicle fracture as well as right proximal femur fracture. S/p intramedullary fixation of the right femur 3/18.  PMHx obesity, HTN, PVD, CKD. Night of 3/18 rapid response event due to desaturating and choking  on POs, weak cough.  SLP swallow evaluation ordered.  ?  ?Assessment / Plan / Recommendation  ?Clinical Impression ? Pt presents with the appearance of a normal swallow with no further problems as identified last night.  She is congested this am, but there is no indication of dysphagia.  Oral mechanism exam is normal.  There is thorough mastication of solids, the appearance of a brisk swallow response, and no s/s of aspiration when consuming successive sips of thin liquid.  Recommend continuing a regular diet, thin liquids. Give meds whole in puree for now.  D/W daughter and Charity fundraiser.  No SLP f/u is warranted  - please consult again if further needs arise. ?SLP Visit Diagnosis: Dysphagia, unspecified (R13.10) ?   ?Aspiration Risk ? No limitations  ?  ?Diet Recommendation   Regular solids, thin liquids ? ?Medication Administration: Whole meds with puree  ?  ?Other  Recommendations Oral Care Recommendations: Oral care BID   ? ?Recommendations for follow up therapy are one component of a multi-disciplinary discharge planning process, led by the attending physician.  Recommendations may be updated based on patient status, additional functional criteria and insurance authorization. ? ?Follow up Recommendations No SLP follow up  ? ? ?  ? ? ?Swallow Study   ?General Date of Onset: 2021-12-23 ?HPI: 76 year old female admitted after falling and sustaining right clavicle fracture as well as right proximal femur fracture. S/p intramedullary fixation of the right femur 3/18.  PMHx obesity, HTN, PVD, CKD. Night of 3/18 rapid response event due to desaturating and choking on POs, weak cough.  SLP swallow evaluation ordered. ?Type of Study: Bedside Swallow Evaluation ?Previous Swallow Assessment: no ?Diet Prior to this Study: Regular;Thin liquids ?Temperature Spikes Noted: No ?Respiratory Status: Nasal cannula ?History of Recent Intubation: Yes ?Length of Intubations (days):  (for procedure) ?Behavior/Cognition: Alert ?Oral Cavity  Assessment: Within Functional Limits ?Oral Care Completed by SLP: No ?Oral Cavity - Dentition: Adequate natural dentition ?Vision: Functional for self-feeding ?Self-Feeding Abilities: Needs assist ?Patient Positioning: Upright in bed ?Baseline Vocal Quality: Normal ?Volitional Cough: Strong ?Volitional Swallow: Able to elicit  ?  ?Oral/Motor/Sensory Function Overall Oral Motor/Sensory Function: Within functional limits   ?Ice Chips Ice chips: Within functional limits   ?Thin Liquid Thin Liquid: Within functional limits  ?  ?Nectar Thick Nectar Thick Liquid: Not tested   ?Honey Thick Honey Thick Liquid: Not tested   ?Puree Puree: Within functional limits   ?Solid ? ? ?  Solid: Within functional limits  ? ?  ? ?Blenda Mounts Laurice ?12/02/2021,11:21 AM ?Marchelle Folks L. Catalia Massett, MA CCC/SLP ?Acute Rehabilitation Services ?Office number [phone] ?Pager [phone] ? ? ? ?

## 2021-12-12 NOTE — Progress Notes (Signed)
? ?Progress Note ? ?Patient Name: [name] [name] ?Date of Encounter: 12/13/2021 ? ?[facility] Cardiologist: Ida Rogue, MD  ? ?Subjective  ? ?[name] [name] is drowsy this AM. Had some RLQ discomfort. No persistent chest pressure. Family by the bedside ? ?Troponin [phone] ? ?Inpatient Medications  ?  ?Scheduled Meds: ? aspirin  81 mg Oral BID WC  ? atorvastatin  40 mg Oral Daily  ? docusate sodium  100 mg Oral BID  ? ezetimibe  10 mg Oral QPM  ? feeding supplement  237 mL Oral BID BM  ? fluconazole  150 mg Oral Daily  ? levothyroxine  50 mcg Oral Q0600  ? lidocaine  1 patch Transdermal Q24H  ? metoprolol tartrate  12.5 mg Oral BID  ? mirabegron ER  50 mg Oral Daily  ? mometasone-formoterol  2 puff Inhalation BID  ? multivitamin with minerals  1 tablet Oral Daily  ? nystatin cream   Topical BID  ? polyethylene glycol  17 g Oral Daily  ? senna-docusate  2 tablet Oral BID  ? sertraline  100 mg Oral Daily  ? ?Continuous Infusions: ? heparin 1,000 Units/hr (12/01/2021 0452)  ? methocarbamol (ROBAXIN) IV    ? ?PRN Meds: ?acetaminophen, bisacodyl, calcium carbonate, Glycerin (Adult), HYDROmorphone (DILAUDID) injection, ketorolac, melatonin, menthol-cetylpyridinium **OR** phenol, methocarbamol **OR** methocarbamol (ROBAXIN) IV, metoCLOPramide **OR** metoCLOPramide (REGLAN) injection, ondansetron **OR** ondansetron (ZOFRAN) IV, traMADol  ? ?Vital Signs  ?  ?Vitals:  ? 12/11/21 2034 12/11/21 2355 12/01/2021 0600 12/23/2021 0816  ?BP: (!) 106/55 100/68 108/73 (!) 108/55  ?Pulse: 97 87 94 99  ?Resp: 19 14 14 16   ?Temp: 98.1 ?F (36.7 ?C) 98.3 ?F (36.8 ?C) 98.5 ?F (36.9 ?C) 98.5 ?F (36.9 ?C)  ?TempSrc: Oral Axillary Axillary Oral  ?SpO2: 98% 93% 100% 96%  ?Weight:      ?Height:      ? ? ?Intake/Output Summary (Last 24 hours) at 12/18/2021 0952 ?Last data filed at 12/11/2021 2200 ?Gross per 24 hour  ?Intake 380 ml  ?Output 700 ml  ?Net -320 ml  ? ?Last 3 Weights 12/07/2021 11/16/2021 09/28/2021  ?Weight (lbs) 188 lb 0.8 oz  185 lb 180 lb 6 oz  ?Weight (kg) 85.3 kg 83.915 kg 81.818 kg  ?   ? ?Telemetry  ?  ?NSR, minimal ventricular ectopy - Personally Reviewed ? ?ECG  ?  ?No new - Personally Reviewed ? ?Physical Exam  ? ?Vitals:  ? 12/07/2021 0600 12/03/2021 0816  ?BP: 108/73 (!) 108/55  ?Pulse: 94 99  ?Resp: 14 16  ?Temp: 98.5 ?F (36.9 ?C) 98.5 ?F (36.9 ?C)  ?SpO2: 100% 96%  ? ? ?GEN: drowsy   ?Neck: No sig JVD ?Cardiac: RRR, no murmurs, rubs, or gallops.  ?Respiratory: nl wob, light crackles ?GI: Soft, nontender, non-distended  ?MS: No edema; No deformity. ?Neuro:  Nonfocal  ?Psych: Normal affect  ? ?Labs  ?  ?High Sensitivity Troponin:   ?Recent Labs  ?Lab 12/11/21 ?1533 12/11/21 ?1839 12/11/21 ?2039  ?TROPONINIHS [id number]* [id number]*  ?   ?Chemistry ?Recent Labs  ?Lab 12/06/2021 ?1830 11/28/2021 ?0308 12/11/21 ?[id number] 12/06/2021 ?0202  ?NA 138 138 138 138  ?K 4.4 4.8 4.6 4.7  ?CL 103 104 106 103  ?CO2 24 28 25 29   ?GLUCOSE 123* 157* 187* 132*  ?BUN 25* 22 16 14   ?CREATININE 1.03* 1.00 1.03* 0.78  ?CALCIUM 8.7* 8.3* 7.7* 8.4*  ?MG  --  1.9  --   --   ?PROT 7.2 6.4* 5.4*  --   ?  ALBUMIN 3.9 3.4* 3.0*  --   ?AST 30 20 25   --   ?ALT 13 15 14   --   ?ALKPHOS 120 99 74  --   ?BILITOT 0.8 0.5 0.3  --   ?GFRNONAA 57* 59* 57* >60  ?ANIONGAP 11 6 7 6   ?  ?Lipids No results for input(s): CHOL, TRIG, HDL, LABVLDL, LDLCALC, CHOLHDL in the last 168 hours.  ?Hematology ?Recent Labs  ?Lab 12/18/2021 ?0308 12/11/21 ?0336 12/11/21 ?1533 12/13/2021 ?0202  ?WBC 10.7* 10.9*  --  13.0*  ?RBC 3.28* 2.20*  --  2.94*  ?HGB 9.8* 6.9* 7.9* 8.7*  ?HCT 31.9* 21.5* 25.2* 27.9*  ?MCV 97.3 97.7  --  94.9  ?MCH 29.9 31.4  --  29.6  ?MCHC 30.7 32.1  --  31.2  ?RDW 13.3 13.5  --  16.4*  ?PLT 172 109*  --  132*  ? ?Thyroid No results for input(s): TSH, FREET4 in the last 168 hours.  ?BNPNo results for input(s): BNP, PROBNP in the last 168 hours.  ?DDimer No results for input(s): DDIMER in the last 168 hours.  ? ?Radiology  ?  ?CT SHOULDER RIGHT WO CONTRAST ? ?Result Date:  11/27/2021 ?CLINICAL DATA:  Shoulder trauma, fracture of humerus or scapula eval medial 3rd clavicle fx EXAM: CT OF THE UPPER RIGHT EXTREMITY WITHOUT CONTRAST TECHNIQUE: Multidetector CT imaging of the upper right extremity was performed according to the standard protocol. RADIATION DOSE REDUCTION: This exam was performed according to the departmental dose-optimization program which includes automated exposure control, adjustment of the mA and/or kV according to patient size and/or use of iterative reconstruction technique. COMPARISON:  Radiograph 12/04/2021 FINDINGS: Bones/Joint/Cartilage There is an inferior displaced, comminuted acute medial clavicle fracture. Fracture extends in close proximity to the articular surface of the right [place] joint, which is asymmetrically widened in comparison to the left with a joint effusion. There is a nondisplaced right posterolateral fourth rib fracture. Unchanged sclerosis of the inferior aspect of the humeral head consistent with chronic infarct. Curvilinear subchondral sclerosis along the medial humeral head, possibly subchondral insufficiency fracture or osteochondral abnormality (series 7, image 30), which could be further evaluated with MRI if clinically indicated. Ligaments Suboptimally assessed by CT. Muscles and Tendons No acute myotendinous abnormality on noncontrast CT. No significant muscle atrophy. Soft tissues There is extensive soft tissue swelling along the right lower neck/upper chest related to the clavicle fracture. No large hematoma. IMPRESSION: Displaced and comminuted acute medial clavicle fracture. Fracture extends in close proximity to the articular surface of the right sternoclavicular joint, which is asymmetrically widened in comparison to the left with a joint effusion. Nondisplaced right posterolateral fourth rib fracture. Chronic findings involving the right humeral head as described above. Electronically Signed   By: Maurine Simmering M.D.   On: 12/16/2021  15:19  ? ?Pelvis Portable ? ?Result Date: 12/17/2021 ?CLINICAL DATA:  Post right femur surgery EXAM: PORTABLE PELVIS 1-2 VIEWS COMPARISON:  Right hip radiographs dated 12/24/2021 at 1021 hours FINDINGS: Status post ORIF of the right hip. Intertrochanteric hip fracture fragments are mildly displaced but improved. Prior cancellous screw fixation of the left hip. Visualized bony pelvis appears intact. Prior vertebral augmentation at L5. IMPRESSION: Status post ORIF of the right hip. Electronically Signed   By: Julian Hy M.D.   On: 12/22/2021 21:29  ? ?DG Knee Right Port ? ?Result Date: 12/09/2021 ?CLINICAL DATA:  Trauma, fall EXAM: PORTABLE RIGHT KNEE - 1-2 VIEW COMPARISON:  None. FINDINGS: No recent fracture or dislocation  is seen in the right knee. There is no significant effusion. Minimal bony spurs seen. Osteopenia is seen in bony structures. Arterial calcifications are seen in the soft tissues. IMPRESSION: No fracture or dislocation is seen in the right knee. Electronically Signed   By: Elmer Picker M.D.   On: 12/09/2021 10:47  ? ?DG C-Arm 1-60 Min-No Report ? ?Result Date: 12/18/2021 ?Fluoroscopy was utilized by the requesting physician.  No radiographic interpretation.  ? ?DG HIP PORT UNILAT WITH PELVIS 1V RIGHT ? ?Result Date: 12/11/2021 ?CLINICAL DATA:  Trauma, fall EXAM: DG HIP (WITH OR WITHOUT PELVIS) 1V PORT RIGHT COMPARISON:  None. FINDINGS: Severely comminuted fracture is seen in the intertrochanteric portion of proximal right femur extending to the proximal shaft. There is displacement and overriding of fracture fragments. There is anterior angulation at the fracture site. IMPRESSION: Comminuted, displaced fracture is seen in the intertrochanteric portion of right femur extending to the proximal shaft. Electronically Signed   By: Elmer Picker M.D.   On: 11/29/2021 10:46  ? ?DG FEMUR, MIN 2 VIEWS RIGHT ? ?Result Date: 12/02/2021 ?CLINICAL DATA:  ORIF intramedullary nail right  intertrochanteric femur EXAM: RIGHT FEMUR 2 VIEWS COMPARISON:  Right hip radiographs 12/08/2021 FINDINGS: Images were performed intraoperatively without the presence of a radiologist. The patient is undergoing right femoral int

## 2021-12-12 NOTE — Progress Notes (Signed)
?PROGRESS NOTE ? ?[name] [name] [id number] DOB: Mar 21, 1946 DOA: 12/05/2021 ?PCP: Ria Bush, MD ? ? LOS: 3 days  ? ?Brief Narrative / Interim history: ?76 year old female with obesity, HTN, PVD, CKD 3A, comes into the hospital after having a fall at home.  She was walking without a walker although she is supposed to use 1, tripped up on her dogs and fell onto her right side/backside.  She experienced pain in the right hip as well as right shoulder, was brought to the ER and found to have right clavicle fracture as well as right proximal femur fracture.  Ortho consulted.  ? ?Subjective / 24h Interval events: ?Remains with hip pain.  Also complains of right upper quadrant pain ? ?Assesement and Plan: ?Principal Problem: ?  Closed intertrochanteric fracture of hip, right, initial encounter ([place]) ?Active Problems: ?  Right clavicle fracture ?  Hypertension ?  Peripheral vascular disease ([place]) ?  COPD (chronic obstructive pulmonary disease) ([place]) ?  MDD (major depressive disorder), recurrent episode, moderate ([place]) ?  Osteoporosis ?  Hypothyroidism (acquired) ?  Chronic diastolic heart failure ([place]) ?  Intertrigo ?  Stage 3a chronic kidney disease (CKD) ([place]) ?  Neuropathy ? ? ?Assessment and Plan: ?Principal problem ?Closed intertrochanteric fracture of hip, right -orthopedic surgery consulted, appreciate input.  She was taken to the OR on 3/17 by Dr. Lyla Glassing and she is status post intramedullary fixation of the right femur.  Weightbearing as tolerated, PT eval pending.  Pain management is an issue as she is intolerant of a lot of narcotics.  P.o. Dilaudid did not help, switch to IV yesterday.  Decrease the dose today as she is a bit drowsy ? ?Active problems ?Non-STEMI-patient experienced chest pain yesterday 3/18, EKG showed depressed ST segments in V4, 5, 6.  Troponin was significantly elevated and cardiology was consulted.  She is on heparin drip currently, cardiology plans for left heart cath  tomorrow ? ?Right clavicle fracture -Management per ortho. Right arm sling.  Nonweightbearing on the right arm ? ?Stage 3a chronic kidney disease (CKD) (HCC) -Baseline creatinine 0.9-1.2, remains at baseline ? ?Anemia of chronic disease, acute blood loss anemia-hemoglobin dropped to 6.9 on 3/18, received a unit of packed red blood cells, improved appropriately and hemoglobin is 8.7 this morning ? ?Thrombocytopenia-likely consumptive, monitor, improving today to 132 ? ?Intertrigo -continue nystatin cream ? ?Chronic diastolic heart failure (HCC) -Stable.  Will be given IV Lasix per cardiology today ? ?Hypothyroidism-Continue synthroid. ? ?Osteoporosis -On fosamax at home. ? ?Depression-Continue zoloft ? ?Hyperlipidemia-continue statin, Zetia ? ?COPD (chronic obstructive pulmonary disease) (HCC) -stable, no wheezing, appears at baseline. ? ?Peripheral vascular disease (HCC)-continue aspirin, Plavix ? ?Hypertension -Stable. Continue home meds. ? ?Scheduled Meds: ? aspirin  81 mg Oral BID WC  ? atorvastatin  40 mg Oral Daily  ? clopidogrel  75 mg Oral Daily  ? docusate sodium  100 mg Oral BID  ? ezetimibe  10 mg Oral QPM  ? feeding supplement  237 mL Oral BID BM  ? fluconazole  150 mg Oral Daily  ? furosemide  20 mg Intravenous Daily  ? levothyroxine  50 mcg Oral Q0600  ? lidocaine  1 patch Transdermal Q24H  ? metoprolol tartrate  12.5 mg Oral BID  ? mirabegron ER  50 mg Oral Daily  ? mometasone-formoterol  2 puff Inhalation BID  ? multivitamin with minerals  1 tablet Oral Daily  ? nystatin cream   Topical BID  ? polyethylene glycol  17 g Oral Daily  ?  senna-docusate  2 tablet Oral BID  ? sertraline  100 mg Oral Daily  ? ?Continuous Infusions: ? heparin 1,000 Units/hr (12/18/2021 0452)  ? methocarbamol (ROBAXIN) IV    ? ?PRN Meds:.acetaminophen, bisacodyl, calcium carbonate, Glycerin (Adult), HYDROmorphone (DILAUDID) injection, ketorolac, melatonin, menthol-cetylpyridinium **OR** phenol, methocarbamol **OR** methocarbamol  (ROBAXIN) IV, metoCLOPramide **OR** metoCLOPramide (REGLAN) injection, ondansetron **OR** ondansetron (ZOFRAN) IV, traMADol ? ?Diet Orders (From admission, onward)  ? ?  Start     Ordered  ? 12/16/2021 2229  Diet regular Room service appropriate? Yes; Fluid consistency: Thin  Diet effective now       ?Question Answer Comment  ?Room service appropriate? Yes   ?Fluid consistency: Thin   ?  ? 12/18/2021 2229  ? ?  ?  ? ?  ? ?DVT prophylaxis: SCDs Start: 11/25/2021 2228 ? ? ?Lab Results  ?Component Value Date  ? PLT 132 (L) 11/26/2021  ? ? ?  Code Status: Full Code ? ?Family Communication: Daughter was present at bedside ? ?Status is: Inpatient ? ?Remains inpatient appropriate because: surgery today ? ?Level of care: Med-Surg ? ?Consultants:  ?Orthopedic surgery  ? ?Procedures:  ?none ? ?Microbiology  ?none ? ?Antimicrobials: ?none  ? ? ?Objective: ?Vitals:  ? 12/11/21 2034 12/11/21 2355 12/18/2021 0600 12/08/2021 0816  ?BP: (!) 106/55 100/68 108/73 (!) 108/55  ?Pulse: 97 87 94 99  ?Resp: 19 14 14 16   ?Temp: 98.1 ?F (36.7 ?C) 98.3 ?F (36.8 ?C) 98.5 ?F (36.9 ?C) 98.5 ?F (36.9 ?C)  ?TempSrc: Oral Axillary Axillary Oral  ?SpO2: 98% 93% 100% 96%  ?Weight:      ?Height:      ? ? ?Intake/Output Summary (Last 24 hours) at 12/24/2021 1029 ?Last data filed at 12/11/2021 2200 ?Gross per 24 hour  ?Intake 380 ml  ?Output 700 ml  ?Net -320 ml  ? ? ?Wt Readings from Last 3 Encounters:  ?12/02/2021 85.3 kg  ?11/16/21 83.9 kg  ?09/28/21 81.8 kg  ? ? ?Examination: ?Constitutional: NAD ?Eyes: lids and conjunctivae normal, no scleral icterus ?ENMT: mmm ?Neck: normal, supple ?Respiratory: Diminished effort, decreased breath sounds at the bases.  No wheezing ?Cardiovascular: Regular rate and rhythm, no murmurs / rubs / gallops. No LE edema. ?Abdomen: soft, mild tenderness to right upper quadrant to deep palpation.  No guarding or rebound ?Skin: no rashes ?Neurologic: no focal deficits, equal strength ? ?Data Reviewed: I have independently reviewed  following labs and imaging studies ? ?CBC ?Recent Labs  ?Lab 12/05/2021 ?1830 12/16/2021 ?0308 12/11/21 ?[id number] 12/11/21 ?1533 12/23/2021 ?0202  ?WBC 10.9* 10.7* 10.9*  --  13.0*  ?HGB 12.4 9.8* 6.9* 7.9* 8.7*  ?HCT 43.2 31.9* 21.5* 25.2* 27.9*  ?PLT 186 172 109*  --  132*  ?MCV 105.6* 97.3 97.7  --  94.9  ?MCH 30.3 29.9 31.4  --  29.6  ?MCHC 28.7* 30.7 32.1  --  31.2  ?RDW 13.1 13.3 13.5  --  16.4*  ?LYMPHSABS 2.6 1.1  --   --   --   ?MONOABS 0.9 0.8  --   --   --   ?EOSABS 0.3 0.0  --   --   --   ?BASOSABS 0.1 0.1  --   --   --   ? ? ? ?Recent Labs  ?Lab 12/16/2021 ?1830 12/11/2021 ?0308 12/11/21 ?[id number] 12/22/2021 ?0202  ?NA 138 138 138 138  ?K 4.4 4.8 4.6 4.7  ?CL 103 104 106 103  ?CO2 24 28 25 29   ?GLUCOSE 123* 157* 187*  132*  ?BUN 25* 22 16 14   ?CREATININE 1.03* 1.00 1.03* 0.78  ?CALCIUM 8.7* 8.3* 7.7* 8.4*  ?AST 30 20 25   --   ?ALT 13 15 14   --   ?ALKPHOS 120 99 74  --   ?BILITOT 0.8 0.5 0.3  --   ?ALBUMIN 3.9 3.4* 3.0*  --   ?MG  --  1.9  --   --   ? ? ? ?------------------------------------------------------------------------------------------------------------------ ?No results for input(s): CHOL, HDL, LDLCALC, TRIG, CHOLHDL, LDLDIRECT in the last 72 hours. ? ?Lab Results  ?Component Value Date  ? HGBA1C 5.1 07/31/2018  ? ?------------------------------------------------------------------------------------------------------------------ ?No results for input(s): TSH, T4TOTAL, T3FREE, THYROIDAB in the last 72 hours. ? ?Invalid input(s): FREET3 ? ?Cardiac Enzymes ?No results for input(s): CKMB, TROPONINI, MYOGLOBIN in the last 168 hours. ? ?Invalid input(s): CK ?------------------------------------------------------------------------------------------------------------------ ?   ?Component Value Date/Time  ? BNP 70.2 09/14/2021 1811  ? ? ?CBG: ?No results for input(s): GLUCAP in the last 168 hours. ? ?Recent Results (from the past 240 hour(s))  ?MRSA Next Gen by PCR, Nasal     Status: None  ? Collection Time: 12/01/2021 12:29  AM  ? Specimen: Nasal Mucosa; Nasal Swab  ?Result Value Ref Range Status  ? MRSA by PCR Next Gen NOT DETECTED NOT DETECTED Final  ?  Comment: (NOTE) ?The GeneXpert MRSA Assay (FDA approved for NASAL specimens only

## 2021-12-12 NOTE — Progress Notes (Signed)
Echo attempted two times. On second attempt, nurse stated patient was going to cath lab. Will attempt again as time permits. ?

## 2021-12-12 NOTE — Progress Notes (Signed)
OT Cancellation Note ? ?Patient Details ?Name: [name] [name] ?MRN: [id number] ?DOB: April 06, 1946 ? ? ?Cancelled Treatment:    Reason Eval/Treat Not Completed: Patient not medically ready: Noted Troponin trending up from 8,873 on 3/18 to 13,253 today with LT heart cath scheduled for tomorrow. OT will hold and await clearance from cardiology.  ? ?Theodoro Clock ?11/25/2021, 12:35 PM ?

## 2021-12-12 NOTE — Progress Notes (Signed)
Patient with hip fracture and NSTEMI had 95% ost=>pRCA calcified stenosis. ?Her SBP is soft in the 80's. She denies any chest pain or dyspnea. No apparent distress. Expressed to patient the current status and plan for tomorrow (review cath and decide on high risk intervention given her calcified vessel). ?

## 2021-12-12 NOTE — Progress Notes (Signed)
Overnight event ? ?Notified by RN that patient and family want comfort care measures. ? ?Chart reviewed, patient is a 76 year old female with a past medical history of hypertension, PVD, CKD stage IIIa, COPD, chronic diastolic CHF.  She was admitted to the hospital on 12/14/2021 after a fall at home.  She was found to have right clavicle fracture as well as right proximal femur fracture.  She was taken to the OR on 3/17 for intramedullary fixation of the right femur.  Patient experienced chest pain on 3/18 and found to have elevated troponin concerning for NSTEMI.  She was started on heparin drip.  She underwent left heart cath today which revealed single-vessel obstructive CAD (critical 95% proximal stenosis in the RCA in an angulated segment.  This is followed by a 90% stenosis in the mid vessel.  The vessel is severely calcified.).  Cardiology planning on repeat cardiac catheterization tomorrow to consider PCI of the RCA (high risk intervention given her calcified vessel).  She was given a dose of IV Lasix today by cardiology.  Also patient's hemoglobin dropped to 6.9 on 3/18 and she received a unit of PRBCs after which hemoglobin improved to 8.7 this morning.  Patient's blood pressure has continued to be low with systolic currently in the 90s but remainder of vital signs stable. ? ?I went to bedside and had a goals of care discussion with the patient and her daughters [place] and [place].  Based on our conversation, it does not seem that family is ready to transition her to comfort care at this time.  They want to wait until morning to speak to cardiology team before making such decisions. I have placed palliative care consult. ?

## 2021-12-12 NOTE — Significant Event (Signed)
Rapid Response Event Note  ? ?Reason for Call :  ?Intermittent desaturations, weak cough. Chokes when given anything PO. ? ?Initial Focused Assessment:  ?Pt lying in bed with eyes closed. She will awaken easily, answer questions, and follow commands. She is very sleepy but did just receive pain medication. Her cough is very weak. Lungs clear t/o, decreased in the bases. Skin warm and dry. ? ?T-98.3, HR-99, BP-100/68, RR-14, SpO2-93% on 3L [place]. ? ?Interventions:  ?NPO-use mouth swabs if needed ? ?Plan of Care:  ?Pt intermittently going apneic. This correlates with her desaturations. Her cough is very weak-she is not able to cough up any secretions. Keep pt NPO. Pt may benefit from swallow eval in AM. Please call RRT if further assistance needed.  ? ?Event Summary:  ? ?MD Notified: Bruna Potter, NP notified PTA RRT ?Call [phone] ?Arrival [id number] ?End Time:0208 ? ?Terrilyn Saver, RN ?

## 2021-12-12 NOTE — Progress Notes (Signed)
ANTICOAGULATION CONSULT NOTE ? ?Pharmacy Consult for Heparin ?Indication: NSTEMI - single vessel CAD ? ?Allergies  ?Allergen Reactions  ? Doxycycline Nausea Only and Other (See Comments)  ?  GI upset ?  ? Oxycontin [Oxycodone] Other (See Comments)  ?  Overly-sedating, causes hypotension, decreased oxgyen  ? Percocet [Oxycodone-Acetaminophen] Other (See Comments)  ?  Overly-sedating, causes hypotension, decreased oxgyen  ? ? ?Patient Measurements: ?Height: 4\' 9"  (144.8 cm) ?Weight: 85.3 kg (188 lb 0.8 oz) ?IBW/kg (Calculated) : 38.6 ?Heparin Dosing Weight: 59 kg ? ?Vital Signs: ?Temp: 98.5 ?F (36.9 ?C) (03/19 [id number]) ?Temp Source: Oral (03/19 [id number]) ?BP: 107/55 (03/19 1458) ?Pulse Rate: 88 (03/19 1458) ? ?Labs: ?Recent Labs  ?  12/06/2021 ?0308 12/11/21 ?0336 12/11/21 ?0336 12/11/21 ?1533 12/11/21 ?1839 12/11/21 ?2039 12/17/2021 ?0202 11/28/2021 ?1016  ?HGB 9.8* 6.9*  --  7.9*  --   --  8.7*  --   ?HCT 31.9* 21.5*  --  25.2*  --   --  27.9*  --   ?PLT 172 109*  --   --   --   --  132*  --   ?HEPARINUNFRC  --   --   --   --   --   --  0.19* 0.43  ?CREATININE 1.00 1.03*  --   --   --   --  0.78  --   ?TROPONINIHS  --   --    < > 3,568* 7,[id number]*  --  13,253*  ? < > = values in this interval not displayed.  ? ? ? ?Estimated Creatinine Clearance: 55 mL/min (by C-G formula based on SCr of 0.78 mg/dL). ? ?Assessment: ?26 yof who presented after mechanical fall from home - underwent closed ORIF for R intertrochanteric fx on 3/17. Now having CP. No AC PTA.  ? ?Heparin drip begun 3/18 pm for NSTEMI.  No bolus due to recent surgery, low Hgb and transfused 3/18, Hgb improved.  ? ?Cath reveals single vessel obstructive CAD with complex RCA lesion. Cards considering atherectomy vs shockwave therapy. Pharmacy consulted to resume IV heparin 8h post sheath removal - sheath out ~15:07. No bleeding noted.  ? ?Goal of Therapy:  ?Heparin level 0.3-0.7 units/ml ?Monitor platelets by anticoagulation protocol: Yes ?  ?Plan:  ?Resume heparin  drip at 1000 units/hr at 23:30 ?8h heparin level ?Daily heparin level and CBC while on heparin ?F/U Cards plan ? ?Thank you for involving pharmacy in this patient's care. ? ?Renold Genta, PharmD, BCPS ?Clinical Pharmacist ?Clinical phone for 12/09/2021 until 10p is [phone] ?12/02/2021 3:29 PM ? ?**Pharmacist phone directory can be found on [email or web address] listed under [place]** ? ? ? ?

## 2021-12-12 NOTE — Progress Notes (Signed)
RT called for new CPAP order due to pt desat when asleep. Pt does not have documented OSA. CPAP started per MD.Auto titrate with 4L O2 bleed in. Pt placed on FFM due to mouth breathing. Pt tolerating well at this time ?

## 2021-12-12 NOTE — Progress Notes (Signed)
Overnight progress note ? ?Notified by RN that patient and her daughter are requesting for her CODE STATUS to be changed from full code to partial code: No intubation, no chest compressions. ? ?I discussed CODE STATUS with the patient and her daughter [name] [name] who requested patient's code status to be changed to partial code: ?NO chest compressions ?NO intubation/ mechanical ventilation ?Okay to use NIPPV/ Bipap ?Okay to administer ACLS medications ?Okay to perform defibrillation or cardioversion ?

## 2021-12-12 NOTE — Anesthesia Postprocedure Evaluation (Signed)
Anesthesia Post Note ? ?Patient: CHAZ RONNING ? ?Procedure(s) Performed: ORIF INTRAMEDULLARY (IM) NAIL INTERTROCHANTRIC (Right) ? ?  ? ?Patient location during evaluation: PACU ?Anesthesia Type: General and Regional ?Level of consciousness: awake and alert ?Pain management: pain level controlled ?Vital Signs Assessment: post-procedure vital signs reviewed and stable ?Respiratory status: spontaneous breathing, nonlabored ventilation, respiratory function stable and patient connected to nasal cannula oxygen ?Cardiovascular status: blood pressure returned to baseline and stable ?Postop Assessment: no apparent nausea or vomiting ?Anesthetic complications: no ? ? ?No notable events documented. ? ?Last Vitals:  ?Vitals:  ? 12/20/2021 0600 12/05/2021 0816  ?BP: 108/73 (!) 108/55  ?Pulse: 94 99  ?Resp: 14 16  ?Temp: 36.9 ?C 36.9 ?C  ?SpO2: 100% 96%  ?  ?Last Pain:  ?Vitals:  ? 11/25/2021 0816  ?TempSrc: Oral  ?PainSc:   ? ? ?  ?  ?  ?  ?  ?  ? ?Zharia Conrow S ? ? ? ? ?

## 2021-12-12 NOTE — Progress Notes (Signed)
ANTICOAGULATION CONSULT NOTE - Follow Up Consult ? ?Pharmacy Consult for heparn ?Indication: chest pain/ACS ? ?Allergies  ?Allergen Reactions  ? Doxycycline Nausea Only and Other (See Comments)  ?  GI upset ?  ? Oxycontin [Oxycodone] Other (See Comments)  ?  Overly-sedating, causes hypotension, decreased oxgyen  ? Percocet [Oxycodone-Acetaminophen] Other (See Comments)  ?  Overly-sedating, causes hypotension, decreased oxgyen  ? ? ?Patient Measurements: ?Height: 4\' 9"  (144.8 cm) ?Weight: 85.3 kg (188 lb 0.8 oz) ?IBW/kg (Calculated) : 38.6 ?Heparin Dosing Weight: 59 kg ? ?Vital Signs: ?Temp: 98.3 ?F (36.8 ?C) (03/18 2355) ?Temp Source: Axillary (03/18 2355) ?BP: 100/68 (03/18 2355) ?Pulse Rate: 87 (03/18 2355) ? ?Labs: ?Recent Labs  ?  12/18/2021 ?0308 12/11/21 ?0336 12/11/21 ?1533 12/11/21 ?1839 12/11/21 ?2039 12/09/2021 ?0202  ?HGB 9.8* 6.9* 7.9*  --   --  8.7*  ?HCT 31.9* 21.5* 25.2*  --   --  27.9*  ?PLT 172 109*  --   --   --  132*  ?HEPARINUNFRC  --   --   --   --   --  0.19*  ?CREATININE 1.00 1.03*  --   --   --  0.78  ?TROPONINIHS  --   --  03/21/230,263* 7,858*  --   ? ? ?Estimated Creatinine Clearance: 55 mL/min (by C-G formula based on SCr of 0.78 mg/dL). ? ? ?Assessment: ?44 yof who presented after mechanical fall from home - underwent closed ORIF for R intertrochanteric fx on 3/17. Now having CP. No AC PTA.  ?  ?Hgb 6.9 >> 8.7, Hct 27.9, plt 132. Trop 4/17. No s/sx of bleeding.  ? ?Per RN heparin infusion running normally, no s/sx bleeding. ? ?Goal of Therapy:  ?Heparin level 0.3-0.7 units/ml ?Monitor platelets by anticoagulation protocol: Yes ?  ?Plan:  ?No bolus due to recent surgery, recovering HGB ?Increase heparin infusion to 1000 units/hr ?Check anti-Xa level in 8 hours ?Continue to monitor H&H and platelets ? ?[id number], PharmD Candidate [phone] ?11/25/2021,3:58 AM ? ? ?

## 2021-12-12 NOTE — Progress Notes (Signed)
PT Cancellation Note ? ?Patient Details ?Name: [name] [name] ?MRN: [id number] ?DOB: 10/13/1945 ? ? ?Cancelled Treatment:    Reason Eval/Treat Not Completed: Patient at procedure or test/unavailable. Pt at left heart cath procedure. Noted Troponin trending up from 8,873 on 3/18 to 13,253 today. Will plan to follow-up once appropriate. ? ? ?Raymond Gurney, PT, DPT ?Acute Rehabilitation Services  ?Pager: [phone] ?Office: [phone] ? ? ?Henrene Dodge Pettis ?12/04/2021, 2:41 PM ? ? ?

## 2021-12-12 NOTE — Progress Notes (Signed)
? ?Subjective: ?2 Days Post-Op Procedure(s) (LRB): ?ORIF INTRAMEDULLARY (IM) NAIL INTERTROCHANTRIC (Right) ?Patient reports pain as moderate.   ?Patient seen in rounds for Dr. Lyla Glassing. ?Patient is resting in bed with family and RN at the bedside. She reports she is having significant pain in the right leg. There was a rapid response event last night due to intermittent desaturation and weak cough. RN reports she is having a swallow eval done this morning. Patient told the RN that she was having abdominal pain after abdominal exam by hospitalist earlier.  ?We will start therapy today.  ? ?Objective: ?Vital signs in last 24 hours: ?Temp:  [98.1 ?F (36.7 ?C)-99.8 ?F (37.7 ?C)] 98.5 ?F (36.9 ?C) (03/19 [id number]) ?Pulse Rate:  [67-120] 99 (03/19 0816) ?Resp:  [14-19] 16 (03/19 0816) ?BP: (100-138)/(55-73) 108/55 (03/19 0816) ?SpO2:  [93 %-100 %] 96 % (03/19 0816) ? ?Intake/Output from previous day: ? ?Intake/Output Summary (Last 24 hours) at 12/16/2021 0821 ?Last data filed at 12/11/2021 2200 ?Gross per 24 hour  ?Intake 380 ml  ?Output 700 ml  ?Net -320 ml  ?  ? ?Intake/Output this shift: ?No intake/output data recorded. ? ?Labs: ?Recent Labs  ?  12/06/2021 ?1830 12/19/2021 ?0308 12/11/21 ?[id number] 12/11/21 ?1533 12/08/2021 ?0202  ?HGB 12.4 9.8* 6.9* 7.9* 8.7*  ? ?Recent Labs  ?  12/11/21 ?0336 12/11/21 ?1533 12/06/2021 ?0202  ?WBC 10.9*  --  13.0*  ?RBC 2.20*  --  2.94*  ?HCT 21.5* 25.2* 27.9*  ?PLT 109*  --  132*  ? ?Recent Labs  ?  12/11/21 ?0336 12/15/2021 ?0202  ?NA 138 138  ?K 4.6 4.7  ?CL 106 103  ?CO2 25 29  ?BUN 16 14  ?CREATININE 1.03* 0.78  ?GLUCOSE 187* 132*  ?CALCIUM 7.7* 8.4*  ? ?No results for input(s): LABPT, INR in the last 72 hours. ? ?Exam: ?General - Patient is Alert and Oriented ?Extremity - Neurologically intact ?Sensation intact distally ?Intact pulses distally ?Dorsiflexion/Plantar flexion intact ? ?RUE: Sling off in bed currently.  ? ?Dressing - dressing C/D/I ?Motor Function - intact, moving foot and toes well on  exam.  ? ?Past Medical History:  ?Diagnosis Date  ? (HFpEF) heart failure with preserved ejection fraction ([place])   ? a. 11/2019 Echo: EF 60-65%, no rwma. Mild LVH. Mild AI, mild to mod AS.  ? Aneurysm ([place])   ? She had 2.6 cm dilation of the infrarenal abdominal aorta on 08/01/15 CTA with 5 year [place] recommended  ? Anxiety   ? Anxiety and depression   ? Aortic stenosis   ? mild-moderate AS 12/23/19 echo  ? Carotid stenosis   ? a. 06/2020 Carotid/Cerebral angio: LICA 70, L Vert 50, RCCA 50-60d, RICA 60-70p, 3.3x2.3 RICA aneurysm, RMCA M1 50-70.  ? CHF (congestive heart failure) ([place])   ? Compression fracture of body of thoracic vertebra (H[facility])   ? T 10  ? Concussion 08/03/2015  ? COPD (chronic obstructive pulmonary disease) ([place]) 12/2012  ? spirometry: Pre: FVC 84%, FEV1 69%, ratio 0.64 consistent with moderate obstruction.  ? Depression   ? Fall 08/03/2015  ? d/c home health 08/2015  ? Fracture of cervical vertebra, C5 (HCC) 08/06/2015  ? History of chicken pox   ? History of stress test   ? a. 01/2019 MV: No isch/infarct. EF 71%.  ? Hyperlipidemia   ? Hypertension   ? Hypothyroidism   ? Lower back pain   ? h/o HNP s/p surgery  ? Neuropathy   ? B/L feet  ?  On supplemental oxygen by nasal cannula   ? at HS and PRN during the day  ? Osteoarthritis   ? h/o ruptured disc s/p ESI  ? Osteoporosis 11/2010  ? DEXA -2.7 spine, thoracic compression fracture  ? Peripheral vascular disease ([place])   ? Smoker   ? quit 10/2012  ? Stroke [facility]) 2010  ? x3 with residual R hemiparesis, s/p R MCA balloon angioplasty (2010)  ? Wears dentures   ? upper  ? ? ?Assessment/Plan: ?2 Days Post-Op Procedure(s) (LRB): ?ORIF INTRAMEDULLARY (IM) NAIL INTERTROCHANTRIC (Right) ?Principal Problem: ?  Closed intertrochanteric fracture of hip, right, initial encounter ([place]) ?Active Problems: ?  Hypertension ?  Peripheral vascular disease ([place]) ?  COPD (chronic obstructive pulmonary disease) ([place]) ?  MDD (major depressive disorder), recurrent episode, moderate  ([place]) ?  Osteoporosis ?  Hypothyroidism (acquired) ?  Chronic diastolic heart failure ([place]) ?  Intertrigo ?  Stage 3a chronic kidney disease (CKD) ([place]) ?  Neuropathy ?  Right clavicle fracture ? ?Estimated body mass index is 40.69 kg/m? as calculated from the following: ?  Height as of this encounter: 4\' 9"  (1.448 m). ?  Weight as of this encounter: 85.3 kg. ?Advance diet ?Up with therapy ? ?Hgb stable at 8.7 this AM. ? ?DVT Prophylaxis - Aspirin ?Weight bearing as tolerated RLE ?NWB RUE ? ?Plan to continue working on optimizing pain management. Dr. Onnie Graham recommended PO dilaudid yesterday, but unfortunately she did not tolerate that. She has been receiving intermittent IV dilaudid instead with benefit, but we discussed that this is not a practical way to continue pain management outside of the hospital setting.  ? ?Hopefully as pain improves, can transition away from IV meds.  ? ?Up with PT as able.  ? ?Recommended RN reach out to hospitalist regarding abdominal pain complaints.  ? ?Griffith Citron, PA-C ?Orthopedic Surgery ?[phone] [id number] ?12/03/2021, 8:21 AM  ?

## 2021-12-12 NOTE — Progress Notes (Signed)
ANTICOAGULATION CONSULT NOTE - Follow Up Consult ? ?Pharmacy Consult for Heparin ?Indication: chest pain/ACS/ NSTEMI ? ?Allergies  ?Allergen Reactions  ? Doxycycline Nausea Only and Other (See Comments)  ?  GI upset ?  ? Oxycontin [Oxycodone] Other (See Comments)  ?  Overly-sedating, causes hypotension, decreased oxgyen  ? Percocet [Oxycodone-Acetaminophen] Other (See Comments)  ?  Overly-sedating, causes hypotension, decreased oxgyen  ? ? ?Patient Measurements: ?Height: 4\' 9"  (144.8 cm) ?Weight: 85.3 kg (188 lb 0.8 oz) ?IBW/kg (Calculated) : 38.6 ?Heparin Dosing Weight: 59 kg ? ?Vital Signs: ?Temp: 98.5 ?F (36.9 ?C) (03/19 [id number]) ?Temp Source: Oral (03/19 [id number]) ?BP: 98/48 (03/19 1154) ?Pulse Rate: 98 (03/19 1154) ? ?Labs: ?Recent Labs  ?  12/03/2021 ?0308 12/11/21 ?0336 12/11/21 ?0336 12/11/21 ?1533 12/11/21 ?1839 12/11/21 ?2039 12/24/2021 ?0202 12/11/2021 ?1016  ?HGB 9.8* 6.9*  --  7.9*  --   --  8.7*  --   ?HCT 31.9* 21.5*  --  25.2*  --   --  27.9*  --   ?PLT 172 109*  --   --   --   --  132*  --   ?HEPARINUNFRC  --   --   --   --   --   --  0.19* 0.43  ?CREATININE 1.00 1.03*  --   --   --   --  0.78  --   ?TROPONINIHS  --   --    < > 3,568* 7,[id number]*  --  13,253*  ? < > = values in this interval not displayed.  ? ? ?Estimated Creatinine Clearance: 55 mL/min (by C-G formula based on SCr of 0.78 mg/dL). ? ?Assessment: ?67 yof who presented after mechanical fall from home - underwent closed ORIF for R intertrochanteric fx on 3/17. Now having CP. No AC PTA.  ? ?Heparin drip begun 3/18 pm for NSTEMI.  No bolus due to recent surgery, low Hgb and transfused 3/18, Hgb improved.  ? ?Initial heparin level low (0.19) on 750 units/hr and infusion rate increased. Now therapeutic (0.43) on 1000 units/hr. No bleeding reported. ? ?Goal of Therapy:  ?Heparin level 0.3-0.7 units/ml ?Monitor platelets by anticoagulation protocol: Yes ?  ?Plan:  ?Continue heparin drip at 1000 units/hr. ?Will recheck heparin level tonight. ?Daily  heparin level and CBC while on heparin. ?Plan cardiac cath on 3/20. ? ?Arty Baumgartner, RPh ?12/16/2021,12:44 PM ? ? ?

## 2021-12-12 NOTE — Progress Notes (Signed)
Patient and family in room (daughter) have asked for pt's code to be changed to partial code: No intubation : No chest compressions  ? ?[facility] MD paged and messaged ?Cardiology paged for Low BP ? ? ?

## 2021-12-13 ENCOUNTER — Inpatient Hospital Stay (HOSPITAL_COMMUNITY): Payer: Medicare HMO

## 2021-12-13 ENCOUNTER — Encounter (HOSPITAL_COMMUNITY): Payer: Self-pay | Admitting: Cardiology

## 2021-12-13 ENCOUNTER — Encounter (HOSPITAL_COMMUNITY): Admission: EM | Disposition: E | Payer: Self-pay | Source: Home / Self Care | Attending: Internal Medicine

## 2021-12-13 DIAGNOSIS — I214 Non-ST elevation (NSTEMI) myocardial infarction: Secondary | ICD-10-CM | POA: Diagnosis not present

## 2021-12-13 DIAGNOSIS — Z515 Encounter for palliative care: Secondary | ICD-10-CM | POA: Diagnosis not present

## 2021-12-13 DIAGNOSIS — S72141A Displaced intertrochanteric fracture of right femur, initial encounter for closed fracture: Secondary | ICD-10-CM | POA: Diagnosis not present

## 2021-12-13 DIAGNOSIS — D62 Acute posthemorrhagic anemia: Secondary | ICD-10-CM | POA: Diagnosis not present

## 2021-12-13 DIAGNOSIS — E785 Hyperlipidemia, unspecified: Secondary | ICD-10-CM

## 2021-12-13 DIAGNOSIS — I25110 Atherosclerotic heart disease of native coronary artery with unstable angina pectoris: Secondary | ICD-10-CM | POA: Diagnosis not present

## 2021-12-13 DIAGNOSIS — I10 Essential (primary) hypertension: Secondary | ICD-10-CM

## 2021-12-13 DIAGNOSIS — Z7189 Other specified counseling: Secondary | ICD-10-CM | POA: Diagnosis not present

## 2021-12-13 DIAGNOSIS — I739 Peripheral vascular disease, unspecified: Secondary | ICD-10-CM

## 2021-12-13 DIAGNOSIS — Z66 Do not resuscitate: Secondary | ICD-10-CM | POA: Diagnosis not present

## 2021-12-13 LAB — CBC
HCT: 19.4 % — ABNORMAL LOW (ref 36.0–46.0)
HCT: 27.7 % — ABNORMAL LOW (ref 36.0–46.0)
Hemoglobin: 6.3 g/dL — CL (ref 12.0–15.0)
Hemoglobin: 9.2 g/dL — ABNORMAL LOW (ref 12.0–15.0)
MCH: 30.6 pg (ref 26.0–34.0)
MCH: 28.9 pg (ref 26.0–34.0)
MCHC: 32.5 g/dL (ref 30.0–36.0)
MCHC: 33.2 g/dL (ref 30.0–36.0)
MCV: 94.2 fL (ref 80.0–100.0)
MCV: 87.1 fL (ref 80.0–100.0)
Platelets: 90 10*3/uL — ABNORMAL LOW (ref 150–400)
Platelets: 98 10*3/uL — ABNORMAL LOW (ref 150–400)
RBC: 2.06 MIL/uL — ABNORMAL LOW (ref 3.87–5.11)
RBC: 3.18 MIL/uL — ABNORMAL LOW (ref 3.87–5.11)
RDW: 15.6 % — ABNORMAL HIGH (ref 11.5–15.5)
RDW: 17.8 % — ABNORMAL HIGH (ref 11.5–15.5)
WBC: 9.1 10*3/uL (ref 4.0–10.5)
WBC: 8.9 10*3/uL (ref 4.0–10.5)
nRBC: 0.2 % (ref 0.0–0.2)

## 2021-12-13 LAB — PREPARE RBC (CROSSMATCH)

## 2021-12-13 LAB — COMPREHENSIVE METABOLIC PANEL
ALT: 11 U/L (ref 0–44)
AST: 78 U/L — ABNORMAL HIGH (ref 15–41)
Albumin: 2.5 g/dL — ABNORMAL LOW (ref 3.5–5.0)
Alkaline Phosphatase: 69 U/L (ref 38–126)
Anion gap: 4 — ABNORMAL LOW (ref 5–15)
BUN: 19 mg/dL (ref 8–23)
CO2: 27 mmol/L (ref 22–32)
Calcium: 7.7 mg/dL — ABNORMAL LOW (ref 8.9–10.3)
Chloride: 101 mmol/L (ref 98–111)
Creatinine, Ser: 0.84 mg/dL (ref 0.44–1.00)
GFR, Estimated: >60 mL/min (ref >60)
Glucose, Bld: 133 mg/dL — ABNORMAL HIGH (ref 70–99)
Potassium: 4.7 mmol/L (ref 3.5–5.1)
Sodium: 132 mmol/L — ABNORMAL LOW (ref 135–145)
Total Bilirubin: 0.5 mg/dL (ref 0.3–1.2)
Total Protein: 4.7 g/dL — ABNORMAL LOW (ref 6.5–8.1)

## 2021-12-13 LAB — HEPARIN LEVEL (UNFRACTIONATED): Heparin Unfractionated: 0.25 IU/mL — ABNORMAL LOW (ref 0.30–0.70)

## 2021-12-13 LAB — POCT ACTIVATED CLOTTING TIME: Activated Clotting Time: 155 seconds

## 2021-12-13 LAB — SAVE SMEAR(SSMR), FOR PROVIDER SLIDE REVIEW

## 2021-12-13 SURGERY — LEFT HEART CATH AND CORONARY ANGIOGRAPHY
Anesthesia: LOCAL

## 2021-12-13 MED ORDER — ASPIRIN 81 MG PO CHEW: 81.0000 mg | CHEWABLE_TABLET | ORAL | Status: DC

## 2021-12-13 MED ORDER — SODIUM CHLORIDE 0.9% IV SOLUTION: Freq: Once | INTRAVENOUS | Status: DC

## 2021-12-13 MED ORDER — SODIUM CHLORIDE 0.9% FLUSH: 3.0000 mL | INTRAVENOUS | Status: DC | PRN

## 2021-12-13 MED ORDER — SODIUM CHLORIDE 0.9 % WEIGHT BASED INFUSION: 1.0000 mL/kg/h | INTRAVENOUS | Status: DC

## 2021-12-13 MED ORDER — SODIUM CHLORIDE 0.9 % WEIGHT BASED INFUSION
3.0000 mL/kg/h | INTRAVENOUS | Status: AC
  Administered 2021-12-13: 3 mL/kg/h via INTRAVENOUS

## 2021-12-13 MED ORDER — SODIUM CHLORIDE 0.9% FLUSH
3.0000 mL | Freq: Two times a day (BID) | INTRAVENOUS | Status: DC
  Administered 2021-12-13 (×2): 3 mL via INTRAVENOUS

## 2021-12-13 MED ORDER — SODIUM CHLORIDE 0.9 % IV SOLN: 250.0000 mL | INTRAVENOUS | Status: DC | PRN

## 2021-12-13 MED FILL — Heparin Sodium (Porcine) Inj 1000 Unit/ML: INTRAMUSCULAR | Qty: 10 | Status: AC

## 2021-12-13 MED FILL — Nitroglycerin IV Soln 100 MCG/ML in D5W: INTRA_ARTERIAL | Qty: 10 | Status: AC

## 2021-12-13 MED FILL — Verapamil HCl IV Soln 2.5 MG/ML: INTRAVENOUS | Qty: 2 | Status: AC

## 2021-12-13 MED FILL — Heparin Sod (Porcine)-NaCl IV Soln 1000 Unit/500ML-0.9%: INTRAVENOUS | Qty: 1000 | Status: AC

## 2021-12-13 NOTE — Evaluation (Signed)
Physical Therapy Evaluation ?Patient Details ?Name: [name] [name] ?MRN: [id number] ?DOB: [birth year] ?Today's Date: 12/16/2021 ? ?History of Present Illness ? Patient is a 76 y/o female who presents on 12/08/2021 s/p fall and found to have right clavicle fracture as well as right proximal femur fracture now s/p IM nail right femur 3/17. Found to have NSTEMI on 12/11/21. Cath Lab on 3/19 found to have single-vessel disease with heavily calcified more than 95% stenosis on her RCA, not able to be intervened upon. Cardiology following for potential further intervention vs medical management alone. PMH includes CVA, COPD, CHF, PVD, OA, osteoporosis.  ? ? Patient presents with pain, swelling in BLEs (RLE>LLE), stiffness, decreased activity tolerance and impaired mobility s/p above. Pt lives at home with family and was using RW (when she wanted too) PTA. Reports hx of falls. Session focused on AROM/AAROM of LEs/UEs and positioning as well as there ex. Pt with drop in BP after getting narcotics but able to tolerate adjusting bed into semi chair position ~ 40 degrees HOB with stable BP. Will attempt egress and sitting up next session as pt tolerates after pain meds. Recommend PRAFO for RLE due to foot drop. Will follow acutely to maximize independence and mobility prior to d/c. ? ?   ? ?Recommendations for follow up therapy are one component of a multi-disciplinary discharge planning process, led by the attending physician.  Recommendations may be updated based on patient status, additional functional criteria and insurance authorization. ? ?Follow Up Recommendations Skilled nursing-short term rehab (<3 hours/day) ? ?  ?Assistance Recommended at Discharge Frequent or constant Supervision/Assistance  ?Patient can return home with the following ? Two people to help with walking and/or transfers;Two people to help with bathing/dressing/bathroom;Help with stairs or ramp for entrance;Assist for transportation;Assistance with  cooking/housework ? ?  ?Equipment Recommendations None recommended by PT  ?Recommendations for Other Services ?    ?  ?Functional Status Assessment Patient has had a recent decline in their functional status and demonstrates the ability to make significant improvements in function in a reasonable and predictable amount of time.  ? ?  ?Precautions / Restrictions Precautions ?Precautions: Fall;Other (comment) ?Precaution Comments: watch BP esp after narcotics ?Required Braces or Orthoses: Sling ?Restrictions ?Weight Bearing Restrictions: Yes ?RUE Weight Bearing: Non weight bearing ?RLE Weight Bearing: Weight bearing as tolerated  ? ?  ? ?Mobility ? Bed Mobility ?Overal bed mobility: Needs Assistance ?  ?  ?  ?  ?  ?  ?General bed mobility comments: Total A for repositioning. Changing bed to chair position with semi recliend at 40* HOB. not able to tolerate much movement but BP stable. ?  ? ?Transfers ?  ?  ?  ?  ?  ?  ?  ?  ?  ?General transfer comment: Defer ?  ? ?Ambulation/Gait ?  ?  ?  ?  ?  ?  ?  ?  ? ?Stairs ?  ?  ?  ?  ?  ? ?Wheelchair Mobility ?  ? ?Modified Rankin (Stroke Patients Only) ?  ? ?  ? ?Balance   ?  ?  ?  ?  ?  ?  ?  ?  ?  ?  ?  ?  ?  ?  ?  ?  ?  ?  ?   ? ? ? ?Pertinent Vitals/Pain Pain Assessment ?Pain Assessment: 0-10 ?Pain Score: 6  ?Pain Location: RLE ?Pain Descriptors / Indicators: Discomfort, Grimacing ?Pain Intervention(s): Monitored during session, Limited activity  within patient's tolerance, Premedicated before session, Repositioned  ? ? ?Home Living Family/patient expects to be discharged to:: Private residence ?Living Arrangements: Children (daughter and family) ?Available Help at Discharge: Family;Available PRN/intermittently ("most of the time") ?Type of Home: House ?Home Access: Stairs to enter ?Entrance Stairs-Rails: Right;Left;Can reach both ?Entrance Stairs-Number of Steps: 5 ?  ?Home Layout: One level ?Home Equipment: Rollator (4 wheels);Grab bars - tub/shower;Grab bars -  toilet;Shower seat;Toilet riser ?   ?  ?Prior Function Prior Level of Function : Needs assist ?  ?  ?  ?  ?  ?  ?Mobility Comments: Uses a rollator for mobility ?ADLs Comments: Daughter assists with bathing. ?  ? ? ?Hand Dominance  ? Dominant Hand: Right ? ?  ?Extremity/Trunk Assessment  ? Upper Extremity Assessment ?Upper Extremity Assessment: Defer to OT evaluation ?RUE Deficits / Details: clavicle fx. Sling for comfort. Assume no shoulder ROM. WFL for hand, wrist, and elbow. Educating daughter and on resting positions. ?  ? ?Lower Extremity Assessment ?Lower Extremity Assessment: Generalized weakness ?RLE Deficits / Details: No activation of right ant tib, foot drop. Limited AROM at knee and hips due to pain/swelling, Sustained clonus present? ?RLE Sensation: WNL ?LLE Deficits / Details: Limited AROM throughout knee/hip (more than RLE however), swelling present. ?LLE Sensation: WNL ?  ? ?   ?Communication  ? Communication: No difficulties  ?Cognition Arousal/Alertness: Awake/alert ?Behavior During Therapy: Flat affect ?Overall Cognitive Status: Impaired/Different from baseline ?Area of Impairment: Problem solving ?  ?  ?  ?  ?  ?  ?  ?  ?  ?  ?  ?  ?  ?  ?Problem Solving: Slow processing ?General Comments: Following commands and agreeable to ROM and positioning changes. Requiring increased time. ?  ?  ? ?  ?General Comments General comments (skin integrity, edema, etc.): daughter present. BP soft but stable. ? ?  ?Exercises General Exercises - Lower Extremity ?Ankle Circles/Pumps: AROM, PROM, Both, 10 reps, Supine ?Quad Sets: AROM, Both, 10 reps, Supine ?Gluteal Sets: AROM, Both, 10 reps, Supine ?Heel Slides: AAROM, Both, 5 reps, Supine ?Hip ABduction/ADduction: AAROM, Both, 5 reps, Supine  ? ?Assessment/Plan  ?  ?PT Assessment Patient needs continued PT services  ?PT Problem List Decreased range of motion;Decreased strength;Decreased mobility;Decreased skin integrity;Pain;Decreased balance;Decreased activity  tolerance ? ?   ?  ?PT Treatment Interventions Patient/family education;Therapeutic activities;Manual techniques;Modalities;Balance training;Therapeutic exercise;Gait training;Functional mobility training   ? ?PT Goals (Current goals can be found in the Care Plan section)  ?Acute Rehab PT Goals ?Patient Stated Goal: decrease pain ?PT Goal Formulation: With patient ?Time For Goal Achievement: 12/27/21 ?Potential to Achieve Goals: Fair ? ?  ?Frequency Min 2X/week ?  ? ? ?Co-evaluation PT/OT/SLP Co-Evaluation/Treatment: Yes ?Reason for Co-Treatment: For patient/therapist safety;To address functional/ADL transfers ?PT goals addressed during session: Mobility/safety with mobility ?  ?  ? ? ?  ?AM-PAC PT "6 Clicks" Mobility  ?Outcome Measure Help needed turning from your back to your side while in a flat bed without using bedrails?: Total ?Help needed moving from lying on your back to sitting on the side of a flat bed without using bedrails?: Total ?Help needed moving to and from a bed to a chair (including a wheelchair)?: Total ?Help needed standing up from a chair using your arms (e.g., wheelchair or bedside chair)?: Total ?Help needed to walk in hospital room?: Total ?Help needed climbing 3-5 steps with a railing? : Total ?6 Click Score: 6 ? ?  ?End of Session   ?  Activity Tolerance: Patient tolerated treatment well ?Patient left: in bed;with call bell/phone within reach;with bed alarm set;with family/visitor present ?Nurse Communication: Mobility status;Other (comment) (rec PRAFO) ?PT Visit Diagnosis: Pain;Muscle weakness (generalized) (M62.81) ?Pain - Right/Left: Right ?Pain - part of body: Leg ?  ? ?Time: 1610-9604 ?PT Time Calculation (min) (ACUTE ONLY): 30 min ? ? ?Charges:   PT Evaluation ?$PT Eval Moderate Complexity: 1 Mod ?  ?  ?   ? ? ?Vale Haven, PT, DPT ?Acute Rehabilitation Services ?Pager [phone] ?Office [phone] ? ? ? ? ?Blake Divine A Renold Kozar ?12/14/2021, 4:15 PM ? ?

## 2021-12-13 NOTE — TOC Initial Note (Addendum)
Transition of Care (TOC) - Initial/Assessment Note  ? ? ?Patient Details  ?Name: [name] [name] ?MRN: [id number] ?Date of Birth: Aug 01, 1946 ? ?Transition of Care (TOC) CM/SW Contact:    ?Milas Gain, LCSWA ?Phone Number: ?11/28/2021, 4:46 PM ? ?Clinical Narrative:                 ? ?CSW received consult for possible SNF placement at time of discharge. CSW spoke with patient and given permission to speak with her 2 daughters and son at bedside regarding PT recommendation of SNF placement at time of discharge. Patient reports she comes from home with daughter [name] [name].Patient expressed understanding of PT recommendation and is agreeable to SNF placement at time of discharge.. Patient gave CSW permission to fax out initial referral near the [place] and [place] area for short term rehab.CSW discussed insurance authorization process with patient and will provide medicare compare ratings list with accepted SNF bed offers when available. Patient reports she has received both Covid Vaccines.Patients passr under review.No further questions reported at this time. CSW to continue to follow and assist with discharge planning needs.  ? ?Expected Discharge Plan: [place] ?Barriers to Discharge: Continued Medical Work up ? ? ?Patient Goals and CMS Choice ?Patient states their goals for this hospitalization and ongoing recovery are:: SNF ?CMS Medicare.gov Compare Post Acute Care list provided to:: Patient ?Choice offered to / list presented to : Patient ? ?Expected Discharge Plan and Services ?Expected Discharge Plan: [place] ?In-house Referral: Clinical Social Work ?  ?  ?Living arrangements for the past 2 months: [place] ?                ?  ?  ?  ?  ?  ?  ?  ?  ?  ?  ? ?Prior Living Arrangements/Services ?Living arrangements for the past 2 months: [place] ?Lives with:: Self, Adult Children (Lives with Daughter [name] [name]) ?Patient language and need for interpreter  reviewed:: Yes ?Do you feel safe going back to the place where you live?: No   SNF  ?Need for Family Participation in Patient Care: Yes (Comment) ?Care giver support system in place?: Yes (comment) ?  ?Criminal Activity/Legal Involvement Pertinent to Current Situation/Hospitalization: No - Comment as needed ? ?Activities of Daily Living ?Home Assistive Devices/Equipment: Gilford Rile (specify type), Oxygen ?ADL Screening (condition at time of admission) ?Patient's cognitive ability adequate to safely complete daily activities?: Yes ?Is the patient deaf or have difficulty hearing?: No ?Does the patient have difficulty seeing, even when wearing glasses/contacts?: No ?Does the patient have difficulty concentrating, remembering, or making decisions?: No ?Patient able to express need for assistance with ADLs?: Yes ?Does the patient have difficulty dressing or bathing?: No ?Independently performs ADLs?: No ?Communication: Independent ?Dressing (OT): Needs assistance ?Is this a change from baseline?: Change from baseline, expected to last <3days ?Grooming: Needs assistance ?Is this a change from baseline?: Change from baseline, expected to last <3 days ?Feeding: Needs assistance ?Is this a change from baseline?: Change from baseline, expected to last <3 days ?Bathing: Needs assistance ?Is this a change from baseline?: Change from baseline, expected to last <3 days ?Toileting: Needs assistance ?Is this a change from baseline?: Change from baseline, expected to last <3 days ?In/Out Bed: Dependent ?Is this a change from baseline?: Pre-admission baseline ?Walks in Home: Needs assistance ?Is this a change from baseline?: Change from baseline, expected to last <3 days ?Does the patient have difficulty walking or climbing stairs?: Yes ?  Weakness of Legs: Right ?Weakness of Arms/Hands: Right ? ?Permission Sought/Granted ?Permission sought to share information with : Case Manager, Family Supports, Forensic psychologist ?Permission granted to share information with : Yes, Verbal Permission Granted ? Share Information with NAME: [name] [name] ? Permission granted to share info w AGENCY: SNF ? Permission granted to share info w Relationship: daughter ? Permission granted to share info w Contact Information: [name] [name] [phone] ? ?Emotional Assessment ?Appearance:: Appears stated age ?Attitude/Demeanor/Rapport: Gracious ?Affect (typically observed): Calm ?Orientation: : Oriented to Self, Oriented to Place, Oriented to  Time, Oriented to Situation ?Alcohol / Substance Use: Not Applicable ?Psych Involvement: No (comment) ? ?Admission diagnosis:  Closed intertrochanteric fracture of hip, right, initial encounter ([place]) [S72.141A] ?Patient Active Problem List  ? Diagnosis Date Noted  ? NSTEMI (non-ST elevated myocardial infarction) ([place]) 12/03/2021  ? Closed intertrochanteric fracture of hip, right, initial encounter ([place]) 12/02/2021  ? Right clavicle fracture 12/22/2021  ? Mixed incontinence 10/23/2021  ? Groin rash 07/27/2021  ? History of recurrent UTIs 02/18/2020  ? Neuropathy 05/06/2019  ? Stage 3a chronic kidney disease (CKD) ([place]) 01/24/2019  ? Tinnitus of both ears 11/24/2018  ? Vision loss of left eye 07/31/2018  ? Thrombocytopenia ([place]) 07/31/2018  ? Tremor 07/03/2018  ? Debility 05/07/2018  ? Chronic diastolic heart failure ([facility]) 01/30/2018  ? Memory loss 01/30/2018  ? Peripheral edema 01/30/2018  ? Intertrigo 01/30/2018  ? Chronic back pain 11/21/2017  ? Chronic respiratory failure with hypoxia (H[facility]) - 2 L/min at night 11/14/2017  ? Closed compression fracture of fourth lumbar vertebra ([place]) 10/02/2017  ? Compression fracture of body of thoracic vertebra ([place]) 09/30/2017  ? Hypothyroidism (acquired) 06/28/2017  ? Vitamin D deficiency 06/25/2017  ? Advanced care planning/counseling discussion 01/29/2015  ? Encounter for general adult medical examination with abnormal findings 01/29/2015  ? Carotid stenosis 04/03/2014  ?  Carotid artery aneurysm ([place]) 04/03/2014  ? Recurrent falls 01/02/2014  ? Hemiparesis affecting right side as late effect of stroke ([place]) 01/02/2014  ? Insomnia 01/10/2013  ? Medicare annual wellness visit, subsequent 11/26/2012  ? Hypertension   ? History of lacunar cerebrovascular accident (CVA)   ? Peripheral vascular disease ([place])   ? COPD (chronic obstructive pulmonary disease) ([place])   ? Hyperlipidemia   ? Osteoarthritis   ? MDD (major depressive disorder), recurrent episode, moderate ([place])   ? Ex-smoker   ? Osteoporosis 11/25/2010  ? ?PCP:  Ria Bush, MD ?Pharmacy:   ?[facility] [place] [facility] [facility] [place] [address] ?[place] ([place]) [place] [address] ?Phone: [phone] Fax: [phone] ? ? ? ? ?Social Determinants of Health (SDOH) Interventions ?  ? ?Readmission Risk Interventions ?Readmission Risk Prevention Plan 12/26/2019  ?Transportation Screening Complete  ?PCP or Specialist Appt within 5-7 Days Complete  ?Home Care Screening Complete  ?Medication Review (RN CM) Complete  ?Some recent data might be hidden  ? ? ? ?

## 2021-12-13 NOTE — Progress Notes (Addendum)
? ?Progress Note ? ?Patient Name: [name] [name] ?Date of Encounter: 12/02/2021 ? ?[facility] Cardiologist: Ida Rogue, MD  ? ?Subjective  ? ?Mrs Saldierna is denies chest pain or shortness of breath this AM. She is having significant pain in her RLE and says it is primarily in the thigh. Family by the bedside ? ?I had a long conversation with the 2 daughters and the patient about her goals of care.  She is clearly DNR.  Is not currently interested in invasive procedures.  Was upset to hear about her hemoglobin level dropping, had not had any more chest pain, mostly noting leg pain.  No signs of bleeding. ? ?Inpatient Medications  ?  ?Scheduled Meds: ? sodium chloride   Intravenous Once  ? sodium chloride   Intravenous Once  ? [START ON 12/14/2021] aspirin  81 mg Oral Pre-Cath  ? atorvastatin  40 mg Oral Daily  ? Chlorhexidine Gluconate Cloth  6 each Topical Daily  ? docusate sodium  100 mg Oral BID  ? ezetimibe  10 mg Oral QPM  ? feeding supplement  237 mL Oral BID BM  ? furosemide  20 mg Intravenous Daily  ? levothyroxine  50 mcg Oral Q0600  ? lidocaine  1 patch Transdermal Q24H  ? metoprolol tartrate  12.5 mg Oral BID  ? mirabegron ER  50 mg Oral Daily  ? mometasone-formoterol  2 puff Inhalation BID  ? multivitamin with minerals  1 tablet Oral Daily  ? nystatin cream   Topical BID  ? polyethylene glycol  17 g Oral Daily  ? senna-docusate  2 tablet Oral BID  ? sertraline  100 mg Oral Daily  ? sodium chloride flush  3 mL Intravenous Q12H  ? sodium chloride flush  3 mL Intravenous Q12H  ? ?Continuous Infusions: ? sodium chloride    ? sodium chloride 30 mL/hr at 12/19/2021 1200  ? [START ON 12/14/2021] sodium chloride    ? Followed by  ? [START ON 12/14/2021] sodium chloride    ? methocarbamol (ROBAXIN) IV    ? ?PRN Meds: ?sodium chloride, sodium chloride, acetaminophen, bisacodyl, calcium carbonate, Glycerin (Adult), HYDROmorphone (DILAUDID) injection, ipratropium-albuterol, ketorolac, melatonin,  menthol-cetylpyridinium **OR** phenol, methocarbamol **OR** methocarbamol (ROBAXIN) IV, metoCLOPramide **OR** metoCLOPramide (REGLAN) injection, nitroGLYCERIN, ondansetron **OR** ondansetron (ZOFRAN) IV, sodium chloride flush, sodium chloride flush, traMADol  ? ?Vital Signs  ?  ?Vitals:  ? 11/24/2021 0300 11/27/2021 0400 12/11/2021 0700 12/12/2021 1157  ?BP: (!) 87/41 (!) 89/47  (!) 83/37  ?Pulse: 79 78  95  ?Resp: (!) 34 (!) 25  18  ?Temp:   98.4 ?F (36.9 ?C) 99 ?F (37.2 ?C)  ?TempSrc:   Oral Oral  ?SpO2: 100% 100%  100%  ?Weight:      ?Height:      ? ? ?Intake/Output Summary (Last 24 hours) at 12/04/2021 1207 ?Last data filed at 12/20/2021 1202 ?Gross per 24 hour  ?Intake 754.52 ml  ?Output 3245 ml  ?Net -2490.48 ml  ? ? ?Last 3 Weights 12/08/2021 11/16/2021 09/28/2021  ?Weight (lbs) 188 lb 0.8 oz 185 lb 180 lb 6 oz  ?Weight (kg) 85.3 kg 83.915 kg 81.818 kg  ?   ? ?Telemetry  ?  ?NSR, rare PVCs- Personally Reviewed ? ?ECG  ?  ?No new - Personally Reviewed ? ?Physical Exam  ? ?Vitals:  ? 12/01/2021 0700 12/15/2021 1157  ?BP:  (!) 83/37  ?Pulse:  95  ?Resp:  18  ?Temp: 98.4 ?F (36.9 ?C) 99 ?F (37.2 ?C)  ?  SpO2:  100%  ? ? ?Constitutional: In no acute distress.  Resting in bed.  Appears to be just uncomfortable.  Reverse Trendelenburg ?HENT: Head: Normocephalic and atraumatic. Eyes: EOM are normal.  ?Neck: Normal range of motion.  ?Cardiovascular: Normal rate, regular rhythm, intact distal pulses. No gallop and no friction rub. 1-2/6 SEM at RUSB.  No lower extremity edema  -> just mild puffy swelling. ?Pulmonary: Non labored breathing on 4L [place], no wheezing or rales  ?Abdominal: Soft. Decreased bowel sounds. Non distended and non tender ?Musculoskeletal:  R foot plantar flexed, pain limiting dorsiflexion, R thigh non edematous swelling, compartments soft.  ?Neurological: Alert and oriented to person, place, and time. Non focal  ?Skin: Skin is warm and dry.  ? ? ?Labs  ?  ?High Sensitivity Troponin:   ?Recent Labs  ?Lab 12/11/21 ?1533  12/11/21 ?1839 12/11/21 ?2039 12/11/2021 ?1016  ?TROPONINIHS [id number]* [id number]*  ? ?   ?Chemistry ?Recent Labs  ?Lab 12/12/2021 ?0308 12/11/21 ?[id number] 12/09/2021 ?0202 12/17/2021 ?[id number]  ?NA 138 138 138 132*  ?K 4.8 4.6 4.7 4.7  ?CL 104 106 103 101  ?CO2 28 25 29 27   ?GLUCOSE 157* 187* 132* 133*  ?BUN 22 16 14 19   ?CREATININE 1.00 1.03* 0.78 0.84  ?CALCIUM 8.3* 7.7* 8.4* 7.7*  ?MG 1.9  --   --   --   ?PROT 6.4* 5.4*  --  4.7*  ?ALBUMIN 3.4* 3.0*  --  2.5*  ?AST 20 25  --  78*  ?ALT 15 14  --  11  ?ALKPHOS 99 74  --  69  ?BILITOT 0.5 0.3  --  0.5  ?GFRNONAA 59* 57* >60 >60  ?ANIONGAP 6 7 6  4*  ? ?  ?Lipids No results for input(s): CHOL, TRIG, HDL, LABVLDL, LDLCALC, CHOLHDL in the last 168 hours.  ?Hematology ?Recent Labs  ?Lab 12/11/21 ?0336 12/11/21 ?1533 12/20/2021 ?0202 12/13/21 ?0741  ?WBC 10.9*  --  13.0* 9.1  ?RBC 2.20*  --  2.94* 2.06*  ?HGB 6.9* 7.9* 8.7* 6.3*  ?HCT 21.5* 25.2* 27.9* 19.4*  ?MCV 97.7  --  94.9 94.2  ?MCH 31.4  --  29.6 30.6  ?MCHC 32.1  --  31.2 32.5  ?RDW 13.5  --  16.4* 15.6*  ?PLT 109*  --  132* 90*  ? ? ?Thyroid No results for input(s): TSH, FREET4 in the last 168 hours.  ?BNPNo results for input(s): BNP, PROBNP in the last 168 hours.  ?DDimer No results for input(s): DDIMER in the last 168 hours.  ? ?Radiology  ?  ?CARDIAC CATHETERIZATION ? ?Result Date: 12/13/2021 ?  Mid LM to Dist LM lesion is 30% stenosed.   Mid LAD to Dist LAD lesion is 25% stenosed.   Prox LAD to Mid LAD lesion is 25% stenosed.   Ost Cx to Mid Cx lesion is 30% stenosed.   Ost RCA to Prox RCA lesion is 95% stenosed.   Prox RCA to Mid RCA lesion is 40% stenosed.   Mid RCA lesion is 95% stenosed.   The left ventricular systolic function is normal.   LV end diastolic pressure is mildly elevated.   The left ventricular ejection fraction is 55-65% by visual estimate.   There is mild aortic valve stenosis. Single vessel obstructive CAD. There is a critical 95% proximal stenosis in the RCA in an angulated segment. This is  followed by a 90% stenosis in the mid vessel. The vessel is severely calcified. There are left to right  collaterals. Good LV function. EF estimated at 55-60% Mildly elevated LVEDP 18 mm Hg Mild aortic stenosis with gradient of approximately 10 mm Hg Plan: currently patient is pain free. Will need to consider PCI of the RCA but this is a very complex lesion. Will review with interventional colleagues. Atherectomy versus Shockwave therapy will be considered. I am concerned that atherectomy will carry a higher risk of perforation given angulation in the proximal vessel.  ? ?DG CHEST PORT 1 VIEW ? ?Result Date: 12/18/2021 ?CLINICAL DATA:  Dyspnea. EXAM: PORTABLE CHEST 1 VIEW COMPARISON:  12/14/2021 FINDINGS: 0828 hours. Low volume film. The cardio pericardial silhouette is enlarged. Slight increase in left basilar atelectasis. Interstitial markings are diffusely coarsened with chronic features. No overt pulmonary edema or focal airspace consolidation. Telemetry leads overlie the chest. As noted previously, there is apparent malalignment of the right sternoclavicular joint. IMPRESSION: Low volume film with chronic interstitial coarsening. Apparent malalignment at the right sternoclavicular joint with right clavicle head fracture better demonstrated on shoulder CT of 12/22/2021. Electronically Signed   By: [name] M.D.   On: 12/07/2021 10:24  ? ?DG Abd Portable 1V ? ?Result Date: 12/09/2021 ?CLINICAL DATA:  Abdominal pain.  Dyspnea. EXAM: PORTABLE ABDOMEN - 1 VIEW COMPARISON:  None. FINDINGS: Lung bases are normal. No free air, portal venous gas, or pneumatosis. Air-filled loops of bowel are identified, primarily colon. Gas extends to the level the rectum. No convincing evidence of obstruction. Mild fecal loading in the ascending colon. IMPRESSION: 1. Mild fecal loading in the ascending colon. No bowel obstruction identified on this study. Electronically Signed   By: Dorise Bullion III M.D.   On: 12/11/2021 10:27    ? ?Cardiac Studies  ? ?[facility] 11/28/2021  ?Single vessel obstructive CAD. There is a critical 95% proximal stenosis in the RCA in an angulated segment. This is followed by a 90% stenosis in the mid vessel. The vessel is severe

## 2021-12-13 NOTE — Progress Notes (Signed)
RT Note:  Per discussion with RN and physician note regarding goals of care, CPAP held at this time. Patient is resting comfortably. Spo2 100%. RT will monitor as needed. ?

## 2021-12-13 NOTE — Consult Note (Addendum)
?Palliative Medicine Inpatient Consult Note ? ?Consulting Provider: Shela Leff, MD ? ?Reason for consult:   ?[facility] Consult  ?Reason for Consult? Goals of care discussion  ? ?HPI:  ?Per intake H&P --> 76 year old female with a past medical history of hypertension, PVD, CKD stage IIIa, COPD, chronic diastolic CHF.  She was admitted to the hospital on 12/24/2021 after a fall at home.  She was found to have right clavicle fracture as well as right proximal femur fracture.  She was taken to the OR on 3/17 for intramedullary fixation of the right femur.  Patient experienced chest pain on 3/18 and found to have elevated troponin concerning for NSTEMI.  She was started on heparin drip.  She underwent left heart cath today which revealed single-vessel obstructive CAD (critical 95% proximal stenosis in the RCA in an angulated segment.  This is followed by a 90% stenosis in the mid vessel.  The vessel is severely calcified.).  Cardiology planning on repeat cardiac catheterization tomorrow to consider PCI of the RCA (high risk intervention given her calcified vessel).  ? ?Palliative care has been asked to get involved to further address goals of care as family is ready to transition to comfort focused care. ? ?Clinical Assessment/Goals of Care: ? ?*Please note that this is a verbal dictation therefore any spelling or grammatical errors are due to the "[place] [employer]" system interpretation. ? ?I have reviewed medical records including EPIC notes, labs and imaging, received report from bedside RN, assessed the patient who is lying in bed in no acute distress.  ?  ?I met with [name] [name], here daughter, [name] [name] and her granddaughter, [name] [name] to further discuss diagnosis prognosis, GOC, EOL wishes, disposition and options. ?  ?I introduced Palliative Medicine as specialized medical care for people living with serious illness. It focuses on providing relief from the symptoms and  stress of a serious illness. The goal is to improve quality of life for both the patient and the family. ? ?Medical History Review and Understanding: ? ?Reviewed with [name] [name] her recent fall leading to a femur fracture requiring surgical fixation.  Discussed her coronary artery disease and its significance causing an NSTEMI.  Reviewed that she has severe stenosis of her vessels - no further surgical intervention will be pursued.  Patient will have medical management. ? ?Social History: ? ?[name] [name] is from [place], [place].  She is divorced.  She had 5 children 2 of whom are deceased.  She now has 2 living daughters and 1 living son. 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  She gets joy out of playing computer games and card games.  She is a woman of faith and shares that she has been saved and practices within the [facility] denomination. ? ?Functional and Nutritional State: ? ?[name] [name] requires assistance with basic activities of daily living.  She lives with her granddaughter [name] [name].  She uses a walker for mobility. ? ?Prior to hospitalization [name] [name] had a good appetite. ? ?Advance Directives: ? ?A detailed discussion was had today regarding advanced directives.  Yes - these are visible on Vynca. ? ?Code Status: ? ?Concepts specific to code status, artifical feeding and hydration, continued IV antibiotics and rehospitalization was had. Patient is an established DNAR/DNI.  ? ?The difference between a aggressive medical intervention path  and a palliative comfort care path for this patient at this time was had.  ? ?We talked about transition to comfort measures in house and  what that would entail inclusive of medications to control pain, dyspnea, agitation, nausea, itching, and hiccups.   ? ?We discussed stopping all uneccessary measures such as cardiac monitoring, blood draws, needle sticks, and frequent vital signs.  ? ?Discussion: ? ?[name] [name] and I reviewed that she is at a difficult spot in  terms of her medical care.  We shared that there are two roads which can be traveled one which is quite aggressive requiring consistent care in the intensive care unit and the other one which is less aggressive and geared towards comfort.  We reviewed the importance of hearing additional information from the cardiology team.  We discussed the differences between comfort care in the hospital, and inpatient hospice, and in the home.  At this point in time it does not appear that her family would be able to accommodate her going home with hospice. ? ?Patient and her family share that she is ready to meet God when it's her time. They express that she is "tired".  ? ?Patient would appreciate some grace in terms of speaking with her family regarding what the next steps will be. ? ?Discussed the importance of continued conversation with family and their  medical providers regarding overall plan of care and treatment options, ensuring decisions are within the context of the patients values and GOCs. ? ?Decision Maker: ?[name] [name] (Daughter): [phone] ? ?[name] [name] (Granddaughter) [phone] ? ?SUMMARY OF RECOMMENDATIONS   ?DNAR/DNI ? ?Appreciate Cardiology providing patients family an update ? ?Differences between comfort care and aggressive medical treatment reviewed ? ?Topic of Hospice gently broached ? ?Ongoing conversations in the days ahead ? ?PT/OT for generalized Weakness ? ?Palliative Care will continue to follow along ? ?Code Status/Advance Care Planning: ?DNAR/DNI ? ?Palliative Prophylaxis:  ?Aspiration, Bowel Regimen, Delirium Protocol, Frequent Pain Assessment, Oral Care, Palliative Wound Care, and Turn Reposition ? ?Additional Recommendations (Limitations, Scope, Preferences): ?Treat what is treatable ? ?Psycho-social/Spiritual:  ?Desire for further Chaplaincy support: Yes ?Additional Recommendations: Education on chronic disease processes ?  ?Prognosis: Poor overall, would presently meeting  hospice criteria.  ? ?Discharge Planning: Discharge plan uncertain.  ? ?Vitals:  ? 12/20/2021 0300 12/05/2021 0400  ?BP: (!) 87/41 (!) 89/47  ?Pulse: 79 78  ?Resp: (!) 34 (!) 25  ?Temp:    ?SpO2: 100% 100%  ? ? ?Intake/Output Summary (Last 24 hours) at 12/19/2021 0649 ?Last data filed at 12/15/2021 0500 ?Gross per 24 hour  ?Intake 608.53 ml  ?Output 2525 ml  ?Net -1916.47 ml  ? ?Last Weight  Most recent update: 12/11/2021 10:03 PM  ? ? Weight  ?85.3 kg (188 lb 0.8 oz)  ?      ? ?  ? ?Gen:  Frail elderly F in NAD ?HEENT: moist mucous membranes ?CV: Regular rate and rhythm ?PULM: On 3LPM [place] ?ABD: soft/nontender ?EXT: No edema ?Neuro: Alert and oriented x3 ? ?PPS: 30-40% ? ? ?This conversation/these recommendations were discussed with patient primary care team, Dr. Cruzita Lederer ? ?MDM [facility] ?______________________________________________________ ?Addendum: ? ?I met at bedside this afternoon with patient and her granddaughter, [name] [name]. We reviewed that Donalyn had met with cardiology and plan to receive 2 units of blood this afternoon. ? ?[name] [name] shares that as a family they will see how she does and make decisions from there. They do not want [name] [name] to go to a rehabilitation. They do not want any additional invasive procedure from a cardiac perspective. ? ?[name] [name] ?[facility] Team ?Team Cell Phone: [phone] ?Please utilize secure chat with  additional questions, if there is no response within 30 minutes please call the above phone number ? ?Palliative Medicine Team providers are available by phone from 7am to 7pm daily and can be reached through the team cell phone.  ?Should this patient require assistance outside of these hours, please call the patient's attending physician. ? ? ?

## 2021-12-13 NOTE — Progress Notes (Signed)
?PROGRESS NOTE ? ?[name] [name] [id number] DOB: 1945/11/28 DOA: 12/02/2021 ?PCP: Ria Bush, MD ? ? LOS: 4 days  ? ?Brief Narrative / Interim history: ?76 year old female with obesity, HTN, PVD, CKD 3A, comes into the hospital after having a fall at home.  She was walking without a walker although she is supposed to use 1, tripped up on her dogs and fell onto her right side/backside.  She experienced pain in the right hip as well as right shoulder, was brought to the ER and found to have right clavicle fracture as well as right proximal femur fracture.  Ortho consulted  ? ?Subjective / 24h Interval events: ?No further chest pains overnight.  Feels weak this morning.  Still bothered by her hip ? ?Assesement and Plan: ?Principal Problem: ?  Closed intertrochanteric fracture of hip, right, initial encounter ([place]) ?Active Problems: ?  Right clavicle fracture ?  Hypertension ?  Peripheral vascular disease ([place]) ?  COPD (chronic obstructive pulmonary disease) ([place]) ?  MDD (major depressive disorder), recurrent episode, moderate ([place]) ?  Osteoporosis ?  Hypothyroidism (acquired) ?  Chronic diastolic heart failure ([place]) ?  Intertrigo ?  Stage 3a chronic kidney disease (CKD) ([place]) ?  Neuropathy ?  NSTEMI (non-ST elevated myocardial infarction) ([place]) ? ? ?Assessment and Plan: ?Principal problem ?Non-STEMI-patient experienced chest pain the day after surgery, on 3/18, EKG showed depressed ST segments in V4, 5, 6.  Troponin was significantly elevated and cardiology was consulted.  She was taken emergently to the Cath Lab on 3/19, found to have single-vessel disease with heavily calcified more than 95% stenosis on her RCA.  This could not be intervened upon, and cardiology continues to follow for potential further intervention versus medical management alone.  She was on heparin drip over the weekend but this is being hold due to anemia ? ?Active problems ?Closed intertrochanteric fracture of hip, right -orthopedic  surgery consulted, appreciate input.  She was taken to the OR on 3/17 by Dr. Lyla Glassing and she is status post intramedullary fixation of the right femur.  Weightbearing as tolerated, PT eval pending.  Pain management is an issue as she is intolerant of a lot of narcotics, but seems to be doing well with low-dose IV Dilaudid ? ?Goals of care-palliative care consulted, appreciate input.  DNR/DNI, ongoing discussion based on patient's progress ? ?Anemia of chronic disease, acute blood loss anemia-hemoglobin dropped to 6.9 on 3/18, received a unit of packed red blood cells, improved on 3/19 but this morning dipped again to 6.3.  Transfuse 2 additional units of packed red blood cells.  This is likely postoperatively, yesterday bedside RN reported some blood loss at a site where an IV was removed requiring for her to keep pressure.  No other clinical signs of bleeding ? ?Thrombocytopenia-likely consumptive, acute illness, continue to monitor. ? ?Right clavicle fracture -Management per ortho. Right arm sling.  Nonweightbearing on the right arm ? ?Stage 3a chronic kidney disease (CKD) (HCC) -Baseline creatinine 0.9-1.2, remains at baseline ? ?Intertrigo -continue nystatin cream ? ?Chronic diastolic heart failure (HCC) -appears stable, she is on furosemide IV daily ? ?Hypothyroidism-Continue synthroid. ? ?Osteoporosis -On fosamax at home. ? ?Depression-Continue zoloft ? ?Hyperlipidemia-continue statin, Zetia ? ?COPD (chronic obstructive pulmonary disease) (HCC) -stable, no wheezing, appears at baseline. ? ?Peripheral vascular disease (HCC)-continue aspirin, Plavix ? ?Hypertension -blood pressure on the soft side.  On Lasix alone ? ?Scheduled Meds: ? sodium chloride   Intravenous Once  ? sodium chloride   Intravenous Once  ? [  START ON 12/14/2021] aspirin  81 mg Oral Pre-Cath  ? atorvastatin  40 mg Oral Daily  ? Chlorhexidine Gluconate Cloth  6 each Topical Daily  ? docusate sodium  100 mg Oral BID  ? ezetimibe  10 mg Oral QPM   ? feeding supplement  237 mL Oral BID BM  ? furosemide  20 mg Intravenous Daily  ? levothyroxine  50 mcg Oral Q0600  ? lidocaine  1 patch Transdermal Q24H  ? metoprolol tartrate  12.5 mg Oral BID  ? mirabegron ER  50 mg Oral Daily  ? mometasone-formoterol  2 puff Inhalation BID  ? multivitamin with minerals  1 tablet Oral Daily  ? nystatin cream   Topical BID  ? polyethylene glycol  17 g Oral Daily  ? senna-docusate  2 tablet Oral BID  ? sertraline  100 mg Oral Daily  ? sodium chloride flush  3 mL Intravenous Q12H  ? sodium chloride flush  3 mL Intravenous Q12H  ? ?Continuous Infusions: ? sodium chloride    ? sodium chloride 30 mL/hr at 11/24/2021 0500  ? [START ON 12/14/2021] sodium chloride    ? Followed by  ? [START ON 12/14/2021] sodium chloride    ? methocarbamol (ROBAXIN) IV    ? ?PRN Meds:.sodium chloride, sodium chloride, acetaminophen, bisacodyl, calcium carbonate, Glycerin (Adult), HYDROmorphone (DILAUDID) injection, ipratropium-albuterol, ketorolac, melatonin, menthol-cetylpyridinium **OR** phenol, methocarbamol **OR** methocarbamol (ROBAXIN) IV, metoCLOPramide **OR** metoCLOPramide (REGLAN) injection, nitroGLYCERIN, ondansetron **OR** ondansetron (ZOFRAN) IV, sodium chloride flush, sodium chloride flush, traMADol ? ?Diet Orders (From admission, onward)  ? ?  Start     Ordered  ? 12/14/21 0000  Diet NPO time specified Except for: Sips with Meds  Diet effective midnight       ?Comments: NPO for solid foods after midnight, may have clear liquids until 5am, then NPO (this would be for inpatients and outpatients)  ?Question:  Except for  Answer:  Ferrel Logan with Meds  ? 11/30/2021 1016  ? 11/29/2021 1005  DIET DYS 3 Room service appropriate? Yes; Fluid consistency: Thin  Diet effective now       ?Question Answer Comment  ?Room service appropriate? Yes   ?Fluid consistency: Thin   ?  ? 12/06/2021 1004  ? ?  ?  ? ?  ? ?DVT prophylaxis: SCDs Start: 12/07/2021 2228 ? ? ?Lab Results  ?Component Value Date  ? PLT 90 (L) 12/03/2021   ? ? ?  Code Status: DNR ? ?Family Communication: Daughter was present at bedside ? ?Status is: Inpatient ? ?Remains inpatient appropriate because: Severity of illness ? ?Level of care: ICU ? ?Consultants:  ?Orthopedic surgery  ? ?Procedures:  ?none ? ?Microbiology  ?none ? ?Antimicrobials: ?none  ? ? ?Objective: ?Vitals:  ? 12/23/2021 0200 11/27/2021 0300 12/09/2021 0400 12/12/2021 0700  ?BP: (!) 82/42 (!) 87/41 (!) 89/47   ?Pulse: 81 79 78   ?Resp: (!) 37 (!) 34 (!) 25   ?Temp:    98.4 ?F (36.9 ?C)  ?TempSrc:    Oral  ?SpO2: 100% 100% 100%   ?Weight:      ?Height:      ? ? ?Intake/Output Summary (Last 24 hours) at 12/05/2021 1116 ?Last data filed at 11/26/2021 0500 ?Gross per 24 hour  ?Intake 499.42 ml  ?Output 2525 ml  ?Net -2025.58 ml  ? ? ?Wt Readings from Last 3 Encounters:  ?12/14/2021 85.3 kg  ?11/16/21 83.9 kg  ?09/28/21 81.8 kg  ? ? ?Examination: ?Constitutional: NAD ?Eyes: lids and conjunctivae normal,  no scleral icterus ?ENMT: mmm ?Neck: normal, supple ?Respiratory: Diminished at the bases, no wheezing heard ?Cardiovascular: Regular rate and rhythm, no murmurs / rubs / gallops. Trace edema. ?Abdomen: soft, no distention, no tenderness. Bowel sounds positive.  ?Skin: no rashes ?Neurologic: no focal deficits, equal strength ? ?Data Reviewed: I have independently reviewed following labs and imaging studies ?CBC ?Recent Labs  ?Lab 12/17/2021 ?1830 12/16/2021 ?0308 12/11/21 ?[id number] 12/11/21 ?1533 12/02/2021 ?0202 12/04/2021 ?0741  ?WBC 10.9* 10.7* 10.9*  --  13.0* 9.1  ?HGB 12.4 9.8* 6.9* 7.9* 8.7* 6.3*  ?HCT 43.2 31.9* 21.5* 25.2* 27.9* 19.4*  ?PLT 186 172 109*  --  132* 90*  ?MCV 105.6* 97.3 97.7  --  94.9 94.2  ?MCH 30.3 29.9 31.4  --  29.6 30.6  ?MCHC 28.7* 30.7 32.1  --  31.2 32.5  ?RDW 13.1 13.3 13.5  --  16.4* 15.6*  ?LYMPHSABS 2.6 1.1  --   --   --   --   ?MONOABS 0.9 0.8  --   --   --   --   ?EOSABS 0.3 0.0  --   --   --   --   ?BASOSABS 0.1 0.1  --   --   --   --   ? ? ? ?Recent Labs  ?Lab 12/03/2021 ?1830 12/04/2021 ?0308  12/11/21 ?[id number] 12/04/2021 ?0202 12/20/2021 ?[id number]  ?NA 138 138 138 138 132*  ?K 4.4 4.8 4.6 4.7 4.7  ?CL 103 104 106 103 101  ?CO2 24 28 25 29 27   ?GLUCOSE 123* 157* 187* 132* 133*  ?BUN 25* 22 16 14 19   ?C

## 2021-12-13 NOTE — Progress Notes (Signed)
Pt and pt's family wish to hold off on cpap at this time.  RT will cont to monitor. ?

## 2021-12-13 NOTE — [place] FL2 (Signed)
?[place] MEDICAID FL2 LEVEL OF CARE SCREENING TOOL  ?  ? ?IDENTIFICATION  ?Patient Name: ?[name] [name] Birthdate: [birth year] Sex: female Admission Date (Current Location): ?12/18/2021  ?[place] and [place] Number: ? [place] ?  Facility and Address:  ?[facility] [place] [address] [place], [place] [address] ?     Provider Number: ?[id number]  ?Attending Physician Name and Address:  ?Caren Griffins, MD ? Relative Name and Phone Number:  ?[name] [phone] ?   ?Current Level of Care: ?Hospital Recommended Level of Care: ?[place] Prior Approval Number: ?  ? ?Date Approved/Denied: ?  PASRR Number: ?Passr under review ? ?Discharge Plan: ?SNF ?  ? ?Current Diagnoses: ?Patient Active Problem List  ? Diagnosis Date Noted  ? NSTEMI (non-ST elevated myocardial infarction) ([place]) 12/19/2021  ? Closed intertrochanteric fracture of hip, right, initial encounter ([place]) 12/24/2021  ? Right clavicle fracture 12/02/2021  ? Mixed incontinence 10/23/2021  ? Groin rash 07/27/2021  ? History of recurrent UTIs 02/18/2020  ? Neuropathy 05/06/2019  ? Stage 3a chronic kidney disease (CKD) ([place]) 01/24/2019  ? Tinnitus of both ears 11/24/2018  ? Vision loss of left eye 07/31/2018  ? Thrombocytopenia ([place]) 07/31/2018  ? Tremor 07/03/2018  ? Debility 05/07/2018  ? Chronic diastolic heart failure ([place]) 01/30/2018  ? Memory loss 01/30/2018  ? Peripheral edema 01/30/2018  ? Intertrigo 01/30/2018  ? Chronic back pain 11/21/2017  ? Chronic respiratory failure with hypoxia (HCC) - 2 L/min at night 11/14/2017  ? Closed compression fracture of fourth lumbar vertebra ([place]) 10/02/2017  ? Compression fracture of body of thoracic vertebra ([place]) 09/30/2017  ? Hypothyroidism (acquired) 06/28/2017  ? Vitamin D deficiency 06/25/2017  ? Advanced care planning/counseling discussion 01/29/2015  ? Encounter for general adult medical examination with abnormal findings 01/29/2015  ? Carotid stenosis 04/03/2014  ?  Carotid artery aneurysm ([place]) 04/03/2014  ? Recurrent falls 01/02/2014  ? Hemiparesis affecting right side as late effect of stroke ([place]) 01/02/2014  ? Insomnia 01/10/2013  ? Medicare annual wellness visit, subsequent 11/26/2012  ? Hypertension   ? History of lacunar cerebrovascular accident (CVA)   ? Peripheral vascular disease ([place])   ? COPD (chronic obstructive pulmonary disease) ([place])   ? Hyperlipidemia   ? Osteoarthritis   ? MDD (major depressive disorder), recurrent episode, moderate ([place])   ? Ex-smoker   ? Osteoporosis 11/25/2010  ? ? ?Orientation RESPIRATION BLADDER Height & Weight   ?  ?Self, Time, Situation, Place ? O2 (Nasal Cannula 4 liters) Incontinent, External catheter (External Urinary Catheter) Weight: 188 lb 0.8 oz (85.3 kg) ?Height:  4\' 9"  (144.8 cm)  ?BEHAVIORAL SYMPTOMS/MOOD NEUROLOGICAL BOWEL NUTRITION STATUS  ?    Incontinent Diet (Please see discharge summary)  ?AMBULATORY STATUS COMMUNICATION OF NEEDS Skin   ?Extensive Assist Verbally Other (Comment) (appropriate for ethnicity,dry,flaky,ecchymosis,arm,back,upper right,erythema abdomen,lower,Incision closed,hip,Right,foam lift dressing,new drainage,old drainage,PRN,dressing in place) ?  ?  ?  ?    ?     ?     ? ? ?Personal Care Assistance Level of Assistance  ?Bathing, Feeding, Dressing Bathing Assistance: Maximum assistance ?Feeding assistance: Independent (able to feed self) ?Dressing Assistance: Maximum assistance ?   ? ?Functional Limitations Info  ?Sight, Hearing, Speech   ?Hearing Info: Adequate ?Speech Info: Adequate  ? ? ?SPECIAL CARE FACTORS FREQUENCY  ?PT (By licensed PT), OT (By licensed OT)   ?  ?PT Frequency: 5x min weekly ?OT Frequency: 5x min weekly ?  ?  ?  ?   ? ? ?  Contractures Contractures Info: Not present  ? ? ?Additional Factors Info  ?Code Status, Allergies, Psychotropic Code Status Info: DNR ?Allergies Info: Doxycycline,Oxycontin (oxycodone),Percocet (oxycodone-acetaminophen) ?Psychotropic Info: sertraline (ZOLOFT)  tablet 100 mg daily ?  ?  ?   ? ?Current Medications (12/08/2021):  This is the current hospital active medication list ?Current Facility-Administered Medications  ?Medication Dose Route Frequency Provider Last Rate Last Admin  ? 0.9 %  sodium chloride infusion (Manually program via Guardrails IV Fluids)   Intravenous Once Rick Duff, MD      ? 0.9 %  sodium chloride infusion (Manually program via Guardrails IV Fluids)   Intravenous Once Caren Griffins, MD      ? 0.9 %  sodium chloride infusion  250 mL Intravenous PRN Ledora Bottcher, PA      ? 0.9 %  sodium chloride infusion  250 mL Intravenous PRN Martinique, Peter M, MD 30 mL/hr at 12/19/2021 1200 Infusion Verify at 11/27/2021 1200  ? [START ON 12/14/2021] 0.9% sodium chloride infusion  3 mL/kg/hr Intravenous Continuous Duke, Tami Lin, PA      ? Followed by  ? Derrill Memo ON 12/14/2021] 0.9% sodium chloride infusion  1 mL/kg/hr Intravenous Continuous Duke, Tami Lin, PA      ? acetaminophen (TYLENOL) tablet 1,000 mg  1,000 mg Oral Q6H PRN Martinique, Peter M, MD      ? atorvastatin (LIPITOR) tablet 40 mg  40 mg Oral Daily Martinique, Peter M, MD   40 mg at 12/24/2021 1116  ? bisacodyl (DULCOLAX) suppository 10 mg  10 mg Rectal Daily PRN Martinique, Peter M, MD      ? calcium carbonate (TUMS - dosed in mg elemental calcium) chewable tablet 200 mg of elemental calcium  1 tablet Oral Q8H PRN Martinique, Peter M, MD   200 mg of elemental calcium at 12/11/21 2034  ? Chlorhexidine Gluconate Cloth 2 % PADS 6 each  6 each Topical Daily Martinique, Peter M, MD   6 each at 12/16/2021 1600  ? docusate sodium (COLACE) capsule 100 mg  100 mg Oral BID Martinique, Peter M, MD   100 mg at 11/28/2021 1121  ? ezetimibe (ZETIA) tablet 10 mg  10 mg Oral QPM Martinique, Peter M, MD   10 mg at 11/28/2021 1748  ? feeding supplement (ENSURE ENLIVE / ENSURE PLUS) liquid 237 mL  237 mL Oral BID BM Martinique, Peter M, MD   237 mL at 12/15/2021 1143  ? Glycerin (Adult) 2 g suppository 1 suppository  1 suppository Rectal  Daily PRN Martinique, Peter M, MD      ? HYDROmorphone (DILAUDID) injection 0.5 mg  0.5 mg Intravenous Q2H PRN Martinique, Peter M, MD   0.1 mg at 12/02/2021 1710  ? ipratropium-albuterol (DUONEB) 0.5-2.5 (3) MG/3ML nebulizer solution 3 mL  3 mL Nebulization Q6H PRN Martinique, Peter M, MD   3 mL at 12/19/2021 1229  ? levothyroxine (SYNTHROID) tablet 50 mcg  50 mcg Oral Q0600 Martinique, Peter M, MD   50 mcg at 12/01/2021 [id number]  ? lidocaine (LIDODERM) 5 % 1 patch  1 patch Transdermal Q24H Martinique, Peter M, MD   1 patch at 11/30/2021 1026  ? melatonin tablet 10 mg  10 mg Oral QHS PRN Martinique, Peter M, MD   10 mg at 12/11/21 2036  ? menthol-cetylpyridinium (CEPACOL) lozenge 3 mg  1 lozenge Oral PRN Martinique, Peter M, MD      ? Or  ? phenol (CHLORASEPTIC) mouth spray 1 spray  1 spray Mouth/Throat  PRN Martinique, Peter M, MD      ? methocarbamol (ROBAXIN) tablet 500 mg  500 mg Oral Q6H PRN Martinique, Peter M, MD   500 mg at 12/17/2021 [id number]  ? Or  ? methocarbamol (ROBAXIN) 500 mg in dextrose 5 % 50 mL IVPB  500 mg Intravenous Q6H PRN Martinique, Peter M, MD      ? metoCLOPramide (REGLAN) tablet 5-10 mg  5-10 mg Oral Q8H PRN Martinique, Peter M, MD      ? Or  ? metoCLOPramide (REGLAN) injection 5-10 mg  5-10 mg Intravenous Q8H PRN Martinique, Peter M, MD      ? metoprolol tartrate (LOPRESSOR) tablet 12.5 mg  12.5 mg Oral BID Martinique, Peter M, MD   12.5 mg at 12/11/2021 1121  ? mirabegron ER (MYRBETRIQ) tablet 50 mg  50 mg Oral Daily Martinique, Peter M, MD   50 mg at 11/24/2021 1113  ? mometasone-formoterol (DULERA) 200-5 MCG/ACT inhaler 2 puff  2 puff Inhalation BID Martinique, Peter M, MD      ? multivitamin with minerals tablet 1 tablet  1 tablet Oral Daily Martinique, Peter M, MD   1 tablet at 11/30/2021 1115  ? nitroGLYCERIN (NITROSTAT) SL tablet 0.4 mg  0.4 mg Sublingual Q5 min PRN Martinique, Peter M, MD      ? nystatin cream (MYCOSTATIN)   Topical BID Martinique, Peter M, MD   Given at 12/16/2021 1142  ? ondansetron (ZOFRAN) tablet 4 mg  4 mg Oral Q6H PRN Martinique, Peter M, MD      ? Or  ?  ondansetron [facility]) injection 4 mg  4 mg Intravenous Q6H PRN Martinique, Peter M, MD      ? polyethylene glycol (MIRALAX / GLYCOLAX) packet 17 g  17 g Oral Daily Martinique, Peter M, MD   17 g at 12/10/2021 1125  ? senna-do

## 2021-12-13 NOTE — Progress Notes (Incomplete)
STUDENT FOLLOW-UP NOTES - DISREGARD ? ? ? ? ? ? ? ? ? ? ? ? ?CC: fall at home >> now CP ? ?HPI: 57 YOF with PMH of aneurysm, stroke, HLD, HTN, PVD originally came to ED following mechanical fall at home with slight head trauma. Underwent ORIF for R intertrochanteric fx on 3/17 but then developed chest pain and diagnosed with NSTEMI. LHC revealed single vessel obstructive CAD with complex RCA lesion, cardiology considering atherectomy vs shockwave therapy vs palliative care -- not pursuing aggressive management at this time ? ?Allergies  ?Allergen Reactions  ? Doxycycline Nausea Only and Other (See Comments)  ?  GI upset ?  ? Oxycontin [Oxycodone] Other (See Comments)  ?  Overly-sedating, causes hypotension, decreased oxgyen  ? Percocet [Oxycodone-Acetaminophen] Other (See Comments)  ?  Overly-sedating, causes hypotension, decreased oxgyen  ?  ? ?Past Medical History:  ?Diagnosis Date  ? (HFpEF) heart failure with preserved ejection fraction ([place])   ? a. 11/2019 Echo: EF 60-65%, no rwma. Mild LVH. Mild AI, mild to mod AS.  ? Aneurysm ([place])   ? She had 2.6 cm dilation of the infrarenal abdominal aorta on 08/01/15 CTA with 5 year [place] recommended  ? Anxiety   ? Anxiety and depression   ? Aortic stenosis   ? mild-moderate AS 12/23/19 echo  ? Carotid stenosis   ? a. 06/2020 Carotid/Cerebral angio: LICA 70, L Vert 50, RCCA 50-60d, RICA 60-70p, 3.3x2.3 RICA aneurysm, RMCA M1 50-70.  ? CHF (congestive heart failure) ([place])   ? Compression fracture of body of thoracic vertebra (HCC)   ? T 10  ? Concussion 08/03/2015  ? COPD (chronic obstructive pulmonary disease) ([place]) 12/2012  ? spirometry: Pre: FVC 84%, FEV1 69%, ratio 0.64 consistent with moderate obstruction.  ? Depression   ? Fall 08/03/2015  ? d/c home health 08/2015  ? Fracture of cervical vertebra, C5 (HCC) 08/06/2015  ? History of chicken pox   ? History of stress test   ? a. 01/2019 MV: No isch/infarct. EF 71%.  ? Hyperlipidemia   ? Hypertension   ? Hypothyroidism   ? Lower  back pain   ? h/o HNP s/p surgery  ? Neuropathy   ? B/L feet  ? On supplemental oxygen by nasal cannula   ? at HS and PRN during the day  ? Osteoarthritis   ? h/o ruptured disc s/p ESI  ? Osteoporosis 11/2010  ? DEXA -2.7 spine, thoracic compression fracture  ? Peripheral vascular disease ([place])   ? Smoker   ? quit 10/2012  ? Stroke [facility] [place]) 2010  ? x3 with residual R hemiparesis, s/p R MCA balloon angioplasty (2010)  ? Wears dentures   ? upper  ?  ? ? ?Family History  ?Problem Relation Age of Onset  ? CAD Mother   ?     MI  ? Cancer Mother   ?     cervical  ? CAD Maternal Aunt   ?     MI  ? CAD Sister   ?     MI  ? Sudden death Father 42  ?     died in his sleep, chain smoker  ? Cirrhosis Father   ? Hypertension Daughter   ? Diabetes Maternal Aunt   ? Cancer Maternal Grandmother   ?     cervical  ?  ?Social History  ? ?Socioeconomic History  ? Marital status: Divorced  ?  Spouse name: Not on file  ? Number of children:  Not on file  ? Years of education: Not on file  ? Highest education level: Not on file  ?Occupational History  ? Not on file  ?Tobacco Use  ? Smoking status: Former  ?  Packs/day: 2.00  ?  Years: 46.00  ?  Pack years: 92.00  ?  Types: Cigarettes  ?  Quit date: 11/08/2012  ?  Years since quitting: 9.1  ? Smokeless tobacco: Never  ?Vaping Use  ? Vaping Use: Never used  ?Substance and Sexual Activity  ? Alcohol use: No  ?  Alcohol/week: 0.0 standard drinks  ? Drug use: No  ? Sexual activity: Not Currently  ?Other Topics Concern  ? Not on file  ?Social History Narrative  ? Lives with granddaughter and great grandchildren  ? Occupation: retired, was CNA  ? Edu: 8th grade  ? Activity: no regular activity:  ? Diet: some water, fruits/vegetables daily  ? ?Social Determinants of Health  ? ?Financial Resource Strain: Low Risk   ? Difficulty of Paying Living Expenses: Not hard at all  ?Food Insecurity: No Food Insecurity  ? Worried About Charity fundraiser in the Last Year: Never true  ? Ran Out of Food in the Last  Year: Never true  ?Transportation Needs: No Transportation Needs  ? Lack of Transportation (Medical): No  ? Lack of Transportation (Non-Medical): No  ?Physical Activity: Sufficiently Active  ? Days of Exercise per Week: 7 days  ? Minutes of Exercise per Session: 30 min  ?Stress: No Stress Concern Present  ? Feeling of Stress : Not at all  ?Social Connections: Socially Isolated  ? Frequency of Communication with Friends and Family: More than three times a week  ? Frequency of Social Gatherings with Friends and Family: Twice a week  ? Attends Religious Services: Never  ? Active Member of Clubs or Organizations: No  ? Attends Archivist Meetings: Never  ? Marital Status: Divorced  ?  ? ?Today's Vitals  ? 12/17/2021 0100 12/23/2021 0200 12/17/2021 0300 12/22/2021 0400  ?BP: (!) 87/53 (!) 82/42 (!) 87/41 (!) 89/47  ?Pulse: 77 81 79 78  ?Resp: (!) 36 (!) 37 (!) 34 (!) 25  ?Temp:      ?TempSrc:      ?SpO2: 100% 100% 100% 100%  ?Weight:      ?Height:      ?PainSc:    Asleep  ? ?Body mass index is 40.69 kg/m?.  ? ?Labs:  ?Imaging: CT no hemorrhage ? ?A/P ?24H ?No acute changes, BPs 100-140s, HR 80s, Pain 2; CBC is unremarkable (Hgb 9.4), CMP is unremarkable, renal function is normal ?ECHO 60-65% EF; doppler noted hematoma on RLE, f/u CT of RLE ?CT of ab showed no retroperitoneal bleeding ?-plan to restart ASA vs Plavix tomorrow pending decision regarding hematoma ?-f/u adding BB+nitrate as pressures tolerate ? -imdur 15 + lopressor 12.5 ?-f/u peripheral smear ?-f/u CXR ?-to floor today, cardiology signing off ? ? ?GDMT changes ?-increase to atorva80/switch to crestor 40 ?-titrate BB as able, switch to lopressor at d/c ?-consider ACE/ARB when appropriate ? ? ?NSTEMI vs [place]and ischemia ?hsTn 13.2k, EF 60-65%, SCr 0.63, pressures soft d/t drop in Hgb, HR in 80s ?GDMT: Lopressor, Atorva40, Zetia 10, DAPT (ASA+plav),  ?-increase atorva to 80?  ?-titrate BB as able; switch to toprol at d/c ? -put hold parameter for BB (SBP <  95) ?-consider ACE/ARB when appropriate (h/o CKD3a; K 4.8) ?-hold heparin, plavix, ASA (Hgb 8.7 >> 9.4, Plts 100 >> 111) ?-  f/u peripheral smear ?  ?PVD/HLD ?LDL 80, Hgb 8.7 >> 9.4, Plts 111 ?GDMT: ASA, Plavix, Atorva 40, Zetia ?-increase atorvastatin to 80mg ? Switch to crestor? ?-ASA over plavix for post-surgical ppx? ? ?HTN ?Pressures labile, likely due to blood loss ?GDMT: none ?-titrate BP when appropriate ?-consider ACE/ARB when appropriate ? ?Hypothyroidism ?GDMT: Levothyroxine 50 ? ? ?Anticoag: as above ? ?Endocrine: A1c 5.1, Glu < 180 ? ?GI/Nutrition: LBM 3/21 ? ?Neuro: RASS -1 >> 0, Pain 7 >> 2 with hydromorphone ? ?Renal: myrbetriq, UOP 1.4L/24h ? ?Pulm: 3 >> 4L [place] ? ?Heme/Onc:  ? ?PTA Med Issues: estradiol cream, Vit d3, vit c, alendronate, proair, melatonin, nystatin cream, advair ? ?Additional Notes:   ?

## 2021-12-13 NOTE — Evaluation (Addendum)
Occupational Therapy Evaluation ?Patient Details ?Name: [name] [name] ?MRN: [id number] ?DOB: 1946-07-10 ?Today's Date: 12/07/2021 ? ? ?History of Present Illness Patient is a 76 y/o female who presents on 11/27/2021 s/p fall and found to have right clavicle fracture as well as right proximal femur fracture now s/p IM nail right femur 3/17. Found to have NSTEMI on 12/11/21. Cath Lab on 3/19 found to have single-vessel disease with heavily calcified more than 95% stenosis on her RCA, not able to be intervened upon. Cardiology following for potential further intervention vs medical management alone. PMH includes CVA, COPD, CHF, PVD, OA, osteoporosis.  ? ?Clinical Impression ?  ?PTA, pt was living with her daughter and was performing BADLs and using a rollator; daughter assisting with bathing. Pt currently requiring Total A for bathing, dressing, toileting, and bed mobility due to limited strength, ROM, and activity tolerance due to pain. Pt agreeable to ROM exercises at BLE and BUEs (maintaining no ROM of R shoulder). Educating daughter on edema management and positioning of RUE; verbalized understanding. Positioning bed into semi-chair position with HOB elevated to 40*. Pt would benefit from further acute OT to facilitate safe dc. Recommend dc to SNF for further OT to optimize safety, independence with ADLs, and return to PLOF.  ?   ? ?Recommendations for follow up therapy are one component of a multi-disciplinary discharge planning process, led by the attending physician.  Recommendations may be updated based on patient status, additional functional criteria and insurance authorization.  ? ?Follow Up Recommendations ? Skilled nursing-short term rehab (<3 hours/day)  ?  ?Assistance Recommended at Discharge Frequent or constant Supervision/Assistance  ?Patient can return home with the following Two people to help with walking and/or transfers;Two people to help with bathing/dressing/bathroom (Total A) ? ?  ?Functional  Status Assessment ? Patient has had a recent decline in their functional status and demonstrates the ability to make significant improvements in function in a reasonable and predictable amount of time.  ?Equipment Recommendations ? Other (comment) (Defer to next venue)  ?  ?Recommendations for Other Services PT consult ? ? ?  ?Precautions / Restrictions Restrictions ?Weight Bearing Restrictions: Yes ?RUE Weight Bearing: Non weight bearing ?RLE Weight Bearing: Weight bearing as tolerated  ? ?  ? ?Mobility Bed Mobility ?Overal bed mobility: Needs Assistance ?  ?  ?  ?  ?  ?  ?General bed mobility comments: Total A for repositioning. Changing bed to chair position with semi recliend at 40* HOB ?  ? ?Transfers ?  ?  ?  ?  ?  ?  ?  ?  ?  ?General transfer comment: Defer ?  ? ?  ?Balance   ?  ?  ?  ?  ?  ?  ?  ?  ?  ?  ?  ?  ?  ?  ?  ?  ?  ?  ?   ? ?ADL either performed or assessed with clinical judgement  ? ?ADL Overall ADL's : Needs assistance/impaired ?  ?  ?  ?  ?  ?  ?  ?  ?  ?  ?  ?  ?  ?  ?  ?  ?  ?  ?  ?General ADL Comments: Max-Total A for ADLs due to limited tolerance for movement which causes pain  ? ? ? ?Vision   ?   ?   ?Perception   ?  ?Praxis   ?  ? ?Pertinent Vitals/Pain Pain Assessment ?Pain Assessment: 0-10 ?  Pain Score: 6  ?Pain Location: RLE ?Pain Descriptors / Indicators: Discomfort, Grimacing ?Pain Intervention(s): Limited activity within patient's tolerance, Monitored during session, Repositioned, Premedicated before session  ? ? ? ?Hand Dominance Right ?  ?Extremity/Trunk Assessment Upper Extremity Assessment ?Upper Extremity Assessment: RUE deficits/detail ?RUE Deficits / Details: clavicle fx. Sling for comfort. Assume no shoulder ROM. WFL for hand, wrist, and elbow. Educating daughter and on resting positions. ?  ?Lower Extremity Assessment ?Lower Extremity Assessment: Defer to PT evaluation ?  ?  ?  ?Communication Communication ?Communication: No difficulties (soft spoken) ?  ?Cognition  Arousal/Alertness: Awake/alert ?Behavior During Therapy: Flat affect ?Overall Cognitive Status: Impaired/Different from baseline ?Area of Impairment: Problem solving ?  ?  ?  ?  ?  ?  ?  ?  ?  ?  ?  ?  ?  ?  ?Problem Solving: Slow processing ?General Comments: Following commands and agreeable to ROM and positioning changes. Requiring increased time. ?  ?  ?General Comments  Daughter present. BP soft but consistent ? ?  ?Exercises Exercises: General Upper Extremity ?General Exercises - Upper Extremity ?Shoulder Flexion: AAROM, Left, 5 reps, Supine ?Elbow Flexion: AAROM, Both, 10 reps, Supine ?Elbow Extension: AAROM, Both, 10 reps, Supine ?Wrist Flexion: AAROM, Both, 10 reps, Supine ?Wrist Extension: AAROM, Both, 10 reps, Supine ?Digit Composite Flexion: AAROM, Both, 10 reps, Supine ?Composite Extension: AAROM, Both, 10 reps, Supine ?  ?Shoulder Instructions    ? ? ?Home Living Family/patient expects to be discharged to:: Private residence ?Living Arrangements: Children (daughter and family) ?Available Help at Discharge: Family;Available PRN/intermittently ("Most of the time") ?Type of Home: House ?Home Access: Stairs to enter ?Entrance Stairs-Number of Steps: 5 ?Entrance Stairs-Rails: Right;Left;Can reach both ?Home Layout: One level (step down livingroomin with ramp) ?  ?  ?Bathroom Shower/Tub: Walk-in shower ?  ?Bathroom Toilet: Standard (toilet risser) ?  ?  ?Home Equipment: Rollator (4 wheels);Grab bars - tub/shower;Grab bars - toilet;Shower seat;Toilet riser ?  ?  ?  ? ?  ?Prior Functioning/Environment Prior Level of Function : Needs assist ?  ?  ?  ?  ?  ?  ?Mobility Comments: Uses a rollator for mobility ?ADLs Comments: Daughter assists with bathing. ?  ? ?  ?  ?OT Problem List: Decreased strength;Decreased range of motion;Decreased activity tolerance;Decreased knowledge of use of DME or AE;Decreased knowledge of precautions ?  ?   ?OT Treatment/Interventions: Self-care/ADL training;Therapeutic exercise;Energy  conservation;DME and/or AE instruction;Therapeutic activities;Patient/family education  ?  ?OT Goals(Current goals can be found in the care plan section) Acute Rehab OT Goals ?Patient Stated Goal: Get better ?OT Goal Formulation: With patient ?Time For Goal Achievement: 12/27/21 ?Potential to Achieve Goals: Good  ?OT Frequency: Min 2X/week ?  ? ?Co-evaluation   ?  ?  ?  ?  ? ?  ?AM-PAC OT "6 Clicks" Daily Activity     ?Outcome Measure Help from another person eating meals?: A Little ?Help from another person taking care of personal grooming?: A Little ?Help from another person toileting, which includes using toliet, bedpan, or urinal?: Total ?Help from another person bathing (including washing, rinsing, drying)?: Total ?Help from another person to put on and taking off regular upper body clothing?: A Lot ?Help from another person to put on and taking off regular lower body clothing?: Total ?6 Click Score: 11 ?  ?End of Session Equipment Utilized During Treatment: Oxygen ?Nurse Communication: Mobility status ? ?Activity Tolerance: Patient limited by pain;Other (comment) (BP) ?Patient left: in bed;with call bell/phone  within reach;with family/visitor present ? ?OT Visit Diagnosis: Unsteadiness on feet (R26.81);Other abnormalities of gait and mobility (R26.89);Muscle weakness (generalized) (M62.81)  ?              ?Time: 4403-4742 ?OT Time Calculation (min): 30 min ?Charges:  OT General Charges ?$OT Visit: 1 Visit ?OT Evaluation ?$OT Eval Moderate Complexity: 1 Mod ? ?Deserae Jennings MSOT, OTR/L ?Acute Rehab ?Pager: [phone] ?Office: [phone] ? ?Mirl Hillery M Ludie Pavlik ?12/22/2021, 3:58 PM ?

## 2021-12-14 ENCOUNTER — Inpatient Hospital Stay (HOSPITAL_COMMUNITY): Payer: Medicare HMO

## 2021-12-14 DIAGNOSIS — S72141A Displaced intertrochanteric fracture of right femur, initial encounter for closed fracture: Secondary | ICD-10-CM | POA: Diagnosis not present

## 2021-12-14 DIAGNOSIS — M7989 Other specified soft tissue disorders: Secondary | ICD-10-CM | POA: Diagnosis not present

## 2021-12-14 DIAGNOSIS — Z7189 Other specified counseling: Secondary | ICD-10-CM | POA: Diagnosis not present

## 2021-12-14 DIAGNOSIS — I5032 Chronic diastolic (congestive) heart failure: Secondary | ICD-10-CM | POA: Diagnosis not present

## 2021-12-14 DIAGNOSIS — M79661 Pain in right lower leg: Secondary | ICD-10-CM | POA: Diagnosis not present

## 2021-12-14 DIAGNOSIS — N1831 Chronic kidney disease, stage 3a: Secondary | ICD-10-CM | POA: Diagnosis not present

## 2021-12-14 DIAGNOSIS — I10 Essential (primary) hypertension: Secondary | ICD-10-CM | POA: Diagnosis not present

## 2021-12-14 DIAGNOSIS — I2589 Other forms of chronic ischemic heart disease: Secondary | ICD-10-CM

## 2021-12-14 DIAGNOSIS — I214 Non-ST elevation (NSTEMI) myocardial infarction: Secondary | ICD-10-CM | POA: Diagnosis not present

## 2021-12-14 DIAGNOSIS — D62 Acute posthemorrhagic anemia: Secondary | ICD-10-CM | POA: Diagnosis not present

## 2021-12-14 DIAGNOSIS — Z515 Encounter for palliative care: Secondary | ICD-10-CM | POA: Diagnosis not present

## 2021-12-14 DIAGNOSIS — I739 Peripheral vascular disease, unspecified: Secondary | ICD-10-CM | POA: Diagnosis not present

## 2021-12-14 LAB — BPAM RBC
Blood Product Expiration Date: 202304142359
Blood Product Expiration Date: 202304142359
ISSUE DATE / TIME: 202303201210
ISSUE DATE / TIME: 202303201215
Unit Type and Rh: 7300
Unit Type and Rh: 7300

## 2021-12-14 LAB — TYPE AND SCREEN
ABO/RH(D): B POS
Antibody Screen: NEGATIVE
Unit division: 0
Unit division: 0

## 2021-12-14 LAB — COMPREHENSIVE METABOLIC PANEL
ALT: 13 U/L (ref 0–44)
AST: 62 U/L — ABNORMAL HIGH (ref 15–41)
Albumin: 2.7 g/dL — ABNORMAL LOW (ref 3.5–5.0)
Alkaline Phosphatase: 68 U/L (ref 38–126)
Anion gap: 6 (ref 5–15)
BUN: 15 mg/dL (ref 8–23)
CO2: 30 mmol/L (ref 22–32)
Calcium: 7.8 mg/dL — ABNORMAL LOW (ref 8.9–10.3)
Chloride: 101 mmol/L (ref 98–111)
Creatinine, Ser: 0.71 mg/dL (ref 0.44–1.00)
GFR, Estimated: >60 mL/min (ref >60)
Glucose, Bld: 123 mg/dL — ABNORMAL HIGH (ref 70–99)
Potassium: 4.3 mmol/L (ref 3.5–5.1)
Sodium: 137 mmol/L (ref 135–145)
Total Bilirubin: 1 mg/dL (ref 0.3–1.2)
Total Protein: 5.2 g/dL — ABNORMAL LOW (ref 6.5–8.1)

## 2021-12-14 LAB — ECHOCARDIOGRAM COMPLETE
AR max vel: 1.49 cm2
AV Area VTI: 1.44 cm2
AV Area mean vel: 1.35 cm2
AV Mean grad: 14 mmHg
AV Peak grad: 25.2 mmHg
Ao pk vel: 2.51 m/s
Area-P 1/2: 6.96 cm2
Calc EF: 64.8 %
Height: 57 in
S' Lateral: 2.3 cm
Single Plane A2C EF: 69.7 %
Single Plane A4C EF: 58.4 %
Weight: 3008.84 oz

## 2021-12-14 LAB — CBC
HCT: 26.3 % — ABNORMAL LOW (ref 36.0–46.0)
Hemoglobin: 8.7 g/dL — ABNORMAL LOW (ref 12.0–15.0)
MCH: 29.1 pg (ref 26.0–34.0)
MCHC: 33.1 g/dL (ref 30.0–36.0)
MCV: 88 fL (ref 80.0–100.0)
Platelets: 100 10*3/uL — ABNORMAL LOW (ref 150–400)
RBC: 2.99 MIL/uL — ABNORMAL LOW (ref 3.87–5.11)
RDW: 17.9 % — ABNORMAL HIGH (ref 11.5–15.5)
WBC: 8 10*3/uL (ref 4.0–10.5)
nRBC: 0.2 % (ref 0.0–0.2)

## 2021-12-14 MED ORDER — MORPHINE SULFATE 15 MG PO TABS: 15.0000 mg | ORAL_TABLET | ORAL | Status: DC | PRN

## 2021-12-14 MED ORDER — ISOSORBIDE MONONITRATE ER 30 MG PO TB24
15.0000 mg | ORAL_TABLET | Freq: Every day | ORAL | Status: DC
  Administered 2021-12-14 – 2021-12-19 (×6): 15 mg via ORAL
  Filled 2021-12-14 (×6): qty 1

## 2021-12-14 MED ORDER — MORPHINE SULFATE 15 MG PO TABS
7.5000 mg | ORAL_TABLET | ORAL | Status: DC | PRN
  Administered 2021-12-14: 7.5 mg via ORAL
  Filled 2021-12-14: qty 1

## 2021-12-14 MED ORDER — MORPHINE SULFATE 15 MG PO TABS: 7.5000 mg | ORAL_TABLET | ORAL | Status: DC | PRN

## 2021-12-14 MED ORDER — BISACODYL 10 MG RE SUPP
10.0000 mg | Freq: Once | RECTAL | Status: AC
  Administered 2021-12-14: 10 mg via RECTAL
  Filled 2021-12-14: qty 1

## 2021-12-14 MED ORDER — HYDROMORPHONE HCL 1 MG/ML IJ SOLN: 0.2000 mg | INTRAMUSCULAR | Status: DC | PRN

## 2021-12-14 MED ORDER — HYDROMORPHONE HCL 2 MG PO TABS
2.0000 mg | ORAL_TABLET | ORAL | Status: DC | PRN
  Administered 2021-12-14 – 2021-12-15 (×4): 2 mg via ORAL
  Filled 2021-12-14 (×4): qty 1

## 2021-12-14 MED ORDER — HYDROMORPHONE HCL 1 MG/ML IJ SOLN
0.2000 mg | Freq: Once | INTRAMUSCULAR | Status: AC
  Administered 2021-12-14: 0.2 mg via INTRAVENOUS
  Filled 2021-12-14: qty 0.5

## 2021-12-14 NOTE — TOC Progression Note (Signed)
Transition of Care (TOC) - Progression Note  ? ? ?Patient Details  ?Name: [name] [name] ?MRN: [id number] ?Date of Birth: 11-08-1945 ? ?Transition of Care (TOC) CM/SW Contact  ?Carley Hammed, LCSWA ?Phone Number: ?12/14/2021, 2:36 PM ? ?Clinical Narrative:    ?CSW provided bed offers with medicare ratings to pt and daughters in the room. All questions were answered, TOC will follow up for bed choice and further needs. ? ? ?Expected Discharge Plan: Skilled Nursing Facility ?Barriers to Discharge: Continued Medical Work up ? ?Expected Discharge Plan and Services ?Expected Discharge Plan: Skilled Nursing Facility ?In-house Referral: Clinical Social Work ?  ?  ?Living arrangements for the past 2 months: Single Family Home ?                ?  ?  ?  ?  ?  ?  ?  ?  ?  ?  ? ? ?Social Determinants of Health (SDOH) Interventions ?  ? ?Readmission Risk Interventions ?Readmission Risk Prevention Plan 12/26/2019  ?Transportation Screening Complete  ?PCP or Specialist Appt within 5-7 Days Complete  ?Home Care Screening Complete  ?Medication Review (RN CM) Complete  ?Some recent data might be hidden  ? ? ?

## 2021-12-14 NOTE — Progress Notes (Signed)
?PROGRESS NOTE ? ?[name] [name] [id number] DOB: December 28, 1945 DOA: 12/14/2021 ?PCP: Ria Bush, MD ? ? LOS: 5 days  ? ?Brief Narrative / Interim history: ?76 year old female with obesity, HTN, PVD, CKD 3A, comes into the hospital after having a fall at home.  She was walking without a walker although she is supposed to use 1, tripped up on her dogs and fell onto her right side/backside.  She experienced pain in the right hip as well as right shoulder, was brought to the ER and found to have right clavicle fracture as well as right proximal femur fracture.  Ortho consulted.  Hospital course complicated by postoperative NSTEMI as well as persistent acute blood loss anemia.  She underwent urgent cath which showed heavily calcified RCA lesion, was not intervened upon but transferred to heart afterwards ? ?Subjective / 24h Interval events: ?Complains of right hip pain.  Denies any shortness of breath.  No chest pain overnight ? ?Assesement and Plan: ?Principal Problem: ?  Closed intertrochanteric fracture of hip, right, initial encounter ([place]) ?Active Problems: ?  Right clavicle fracture ?  Hypertension ?  Peripheral vascular disease ([place]) ?  COPD (chronic obstructive pulmonary disease) ([place]) ?  MDD (major depressive disorder), recurrent episode, moderate ([place]) ?  Osteoporosis ?  Hypothyroidism (acquired) ?  Chronic diastolic heart failure ([place]) ?  Intertrigo ?  Stage 3a chronic kidney disease (CKD) ([place]) ?  Neuropathy ?  NSTEMI (non-ST elevated myocardial infarction) ([place]) ? ? ?Assessment and Plan: ?Principal problem ?Non-STEMI-patient experienced chest pain the day after surgery, on 3/18, EKG showed depressed ST segments in V4, 5, 6.  Troponin was significantly elevated and cardiology was consulted.  She was taken emergently to the Cath Lab on 3/19, found to have single-vessel disease with heavily calcified more than 95% stenosis on her RCA.  This could not be intervened upon, and cardiology continues to  follow for potential further intervention versus medical management alone.  She was on heparin drip over the weekend but this is being hold due to anemia.  No further chest pains ? ?Active problems ?Closed intertrochanteric fracture of hip, right -orthopedic surgery consulted, appreciate input.  She was taken to the OR on 3/17 by Dr. Lyla Glassing and she is status post intramedullary fixation of the right femur.  Weightbearing as tolerated, PT eval as tolerated given acute issues.  Pain management is an issue as she is intolerant of a lot of narcotics, but seems to be doing well with low-dose IV Dilaudid.  Transition to p.o. morphine for longer acting ? ?Goals of care-palliative care consulted, appreciate input.  DNR/DNI, ongoing discussion based on patient's progress ? ?Anemia of chronic disease, acute blood loss anemia-hemoglobin dropped to 6.9 on 3/18, received a unit of packed red blood cells, improved on 3/19 but dropped again to 6.3 on 3/20.  She was transfused 2 additional units of packed red blood cells for total of 3 units this hospitalization so far.  Obtain a CT scan of the abdomen and pelvis to rule out retroperitoneal bleed given persistent anemia as well as hip and back pain. ? ?Right lower extremity swelling-likely related to recent surgery/immobility, but rule out DVT with a Doppler ultrasound ? ?Thrombocytopenia-likely consumptive, acute illness, continue to monitor.  Overall better ? ?Right clavicle fracture -Management per ortho. Right arm sling.  Nonweightbearing on the right arm ? ?Stage 3a chronic kidney disease (CKD) (HCC) -Baseline creatinine 0.9-1.2, remains at baseline this morning ? ?Intertrigo -continue nystatin cream ? ?Chronic diastolic heart failure (HCC) -  appears stable, received IV furosemide for a few days but now held due to soft blood pressures.  She was started on metoprolol by cardiology ? ?Hypothyroidism-Continue synthroid. ? ?Osteoporosis -On fosamax at  home. ? ?Depression-Continue zoloft ? ?Hyperlipidemia-continue statin, Zetia ? ?COPD (chronic obstructive pulmonary disease) (HCC) -stable, no wheezing, appears at baseline. ? ?Peripheral vascular disease (HCC)-continue aspirin, Plavix ? ?Hypertension -blood pressure on the soft side.  Continue low-dose metoprolol, now off Lasix ? ?Scheduled Meds: ? sodium chloride   Intravenous Once  ? sodium chloride   Intravenous Once  ? atorvastatin  40 mg Oral Daily  ? bisacodyl  10 mg Rectal Once  ? Chlorhexidine Gluconate Cloth  6 each Topical Daily  ? docusate sodium  100 mg Oral BID  ? ezetimibe  10 mg Oral QPM  ? feeding supplement  237 mL Oral BID BM  ? levothyroxine  50 mcg Oral Q0600  ? lidocaine  1 patch Transdermal Q24H  ? metoprolol tartrate  12.5 mg Oral BID  ? mirabegron ER  50 mg Oral Daily  ? mometasone-formoterol  2 puff Inhalation BID  ? multivitamin with minerals  1 tablet Oral Daily  ? nystatin cream   Topical BID  ? polyethylene glycol  17 g Oral Daily  ? senna-docusate  2 tablet Oral BID  ? sertraline  100 mg Oral Daily  ? ?Continuous Infusions: ? sodium chloride 30 mL/hr at 12/12/2021 1200  ? sodium chloride 1 mL/kg/hr (12/14/21 0147)  ? methocarbamol (ROBAXIN) IV    ? ?PRN Meds:.sodium chloride, acetaminophen, bisacodyl, calcium carbonate, Glycerin (Adult), ipratropium-albuterol, melatonin, menthol-cetylpyridinium **OR** phenol, methocarbamol **OR** methocarbamol (ROBAXIN) IV, metoCLOPramide **OR** metoCLOPramide (REGLAN) injection, morphine, nitroGLYCERIN, ondansetron **OR** ondansetron (ZOFRAN) IV ? ?Diet Orders (From admission, onward)  ? ?  Start     Ordered  ? 12/14/21 0000  Diet NPO time specified Except for: Sips with Meds  Diet effective midnight       ?Comments: NPO for solid foods after midnight, may have clear liquids until 5am, then NPO (this would be for inpatients and outpatients)  ?Question:  Except for  Answer:  Ferrel Logan with Meds  ? 12/20/2021 1016  ? ?  ?  ? ?  ? ?DVT prophylaxis: SCDs Start:  11/26/2021 2228 ? ? ?Lab Results  ?Component Value Date  ? PLT 100 (L) 12/14/2021  ? ? ?  Code Status: DNR ? ?Family Communication: Daughter was present at bedside ? ?Status is: Inpatient ? ?Remains inpatient appropriate because: Severity of illness ? ?Level of care: ICU ? ?Consultants:  ?Orthopedic surgery  ? ?Procedures:  ?none ? ?Microbiology  ?none ? ?Antimicrobials: ?none  ? ? ?Objective: ?Vitals:  ? 12/14/21 0530 12/14/21 0600 12/14/21 0630 12/14/21 0749  ?BP: 114/68 (!) 119/56 120/71   ?Pulse: 85 84 98   ?Resp: (!) 23 (!) 22 17   ?Temp:    98.3 ?F (36.8 ?C)  ?TempSrc:    Oral  ?SpO2: 96% 96% 95%   ?Weight:      ?Height:      ? ? ?Intake/Output Summary (Last 24 hours) at 12/14/2021 0932 ?Last data filed at 12/14/2021 [id number] ?Gross per 24 hour  ?Intake 1932.1 ml  ?Output 1420 ml  ?Net 512.1 ml  ? ? ?Wt Readings from Last 3 Encounters:  ?12/06/2021 85.3 kg  ?11/16/21 83.9 kg  ?09/28/21 81.8 kg  ? ? ?Examination: ?Constitutional: NAD ?Eyes: lids and conjunctivae normal, no scleral icterus ?ENMT: mmm ?Neck: normal, supple ?Respiratory: Diminished at the bases, shallow respirations,  no wheezing heard ?Cardiovascular: Regular rate and rhythm, no murmurs / rubs / gallops. No LE edema. ?Abdomen: soft, no distention, no tenderness. Bowel sounds positive.  ?Skin: no rashes ?Neurologic: no focal deficits, equal strength.  Generalized weakness present ? ?Data Reviewed: I have independently reviewed following labs and imaging studies ?CBC ?Recent Labs  ?Lab 11/25/2021 ?1830 12/05/2021 ?0308 12/11/21 ?[id number] 12/11/21 ?1533 12/20/2021 ?0202 11/26/2021 ?[id number] 12/14/2021 ?2149 12/14/21 ?0136  ?WBC 10.9* 10.7* 10.9*  --  13.0* 9.1 8.9 8.0  ?HGB 12.4 9.8* 6.9* 7.9* 8.7* 6.3* 9.2* 8.7*  ?HCT 43.2 31.9* 21.5* 25.2* 27.9* 19.4* 27.7* 26.3*  ?PLT 186 172 109*  --  132* 90* 98* 100*  ?MCV 105.6* 97.3 97.7  --  94.9 94.2 87.1 88.0  ?MCH 30.3 29.9 31.4  --  29.6 30.6 28.9 29.1  ?MCHC 28.7* 30.7 32.1  --  31.2 32.5 33.2 33.1  ?RDW 13.1 13.3 13.5  --  16.4*  15.6* 17.8* 17.9*  ?LYMPHSABS 2.6 1.1  --   --   --   --   --   --   ?MONOABS 0.9 0.8  --   --   --   --   --   --   ?EOSABS 0.3 0.0  --   --   --   --   --   --   ?BASOSABS 0.1 0.1  --   --   --   --   --   --   ? ? ? ?Recent Labs

## 2021-12-14 NOTE — Progress Notes (Signed)
Family has been feeding pt grits and biscuits, MD notified, SLP ordered, educated family on importance of waiting for speech to assess pt, family states they have been feeding pt even with npo order in place, pt is not showing any signs of aspiration. ?

## 2021-12-14 NOTE — Progress Notes (Signed)
Orthopedic Tech Progress Note ?Patient Details:  ?[name] [name] ?[birth year] ?[id number] ? ?Patient ID: [name] [name], female   DOB: 09-30-1945, 76 y.o.   MRN: [id number] ?I called in order to hanger ?Trinna Post ?12/14/2021, 5:19 AM ? ?

## 2021-12-14 NOTE — Progress Notes (Signed)
? ?Palliative Medicine Inpatient Follow Up Note ? ? ?HPI: ?76 year old female with a past medical history of hypertension, PVD, CKD stage IIIa, COPD, chronic diastolic CHF.  She was admitted to the hospital on 11/28/2021 after a fall at home.  She was found to have right clavicle fracture as well as right proximal femur fracture.  She was taken to the OR on 3/17 for intramedullary fixation of the right femur.  Patient experienced chest pain on 3/18 and found to have elevated troponin concerning for NSTEMI.  She was started on heparin drip.  She underwent left heart cath today which revealed single-vessel obstructive CAD (critical 95% proximal stenosis in the RCA in an angulated segment.  This is followed by a 90% stenosis in the mid vessel.  The vessel is severely calcified.).  Cardiology planning on repeat cardiac catheterization tomorrow to consider PCI of the RCA (high risk intervention given her calcified vessel).  ?  ?Palliative care has been asked to get involved to further address goals of care. ? ?Today's Discussion (12/14/2021): ? ?*Please note that this is a verbal dictation therefore any spelling or grammatical errors are due to the "[place]" system interpretation. ? ?Chart reviewed inclusive of vital signs, progress notes, laboratory results, and diagnostic images.  ? ?Patient night RN, [name] [name]. She shares that [facility] had a great night other than needing pain meds x2 for her hip. Hgb responded well to transfusion.  ? ?Per review of TOC notes, patients family is now amenable to skilled nursing. ? ?I met with [name] [name], and [name] [name] this morning. We reviewed the frustration they are experiencing hearing different information from each provider. Discussed how overwhelming this is. ? ?[name] [name] shares she feels a little better today though is still having pain in her R leg. Upon assessment RUE was taught, warm, and tender to light palpation. As compared to left it is far more swollen. I expressed that  I would alert Dr. Cruzita Lederer as family expresses wanting answers. ? ?Additional concerns are related to when ECHO will be completed. We reviewed that it has been orders though not yet completed and we can follow up on the time it will be done. ? ?[name] [name] shares that she does not feel [name] [name] getting IV pain medications and working with PT/OT is a true reflection of what will be done at skilled nursing, We discussing trying orals 30 minutes prior to mobility to better emulate what will be done at rehab. ? ?Reviewed the importance of being open and honest. Family wants everyone to be "straight forward" with them so they know which direction to travel as it relates to [name] [name] care. ? ?Presently, [name] and her daughter/granddaughter would like to continue medications and identify the root of many of her problems. We did discuss that her disease burden is significant which is why topics such as hospice were mentioned. We reviewed that my role Is to advocate for the patient to better aid in goals which are important to her.  ? ?Patient and family are amenable to OP Palliative support on discharge.  ? ?Questions and concerns addressed  ? ?Palliative Support Provided ? ?Objective Assessment: ?Vital Signs ?Vitals:  ? 12/14/21 0600 12/14/21 0630  ?BP: (!) 119/56 120/71  ?Pulse: 84 98  ?Resp: (!) 22 17  ?Temp:    ?SpO2: 96% 95%  ? ? ?Intake/Output Summary (Last 24 hours) at 12/14/2021 0700 ?Last data filed at 12/14/2021 6789 ?Gross per 24 hour  ?Intake 1932.1 ml  ?Output 1420 ml  ?Net  512.1 ml  ? ?Last Weight  Most recent update: 12/08/2021 10:03 PM  ? ? Weight  ?85.3 kg (188 lb 0.8 oz)  ?      ? ?  ? ?Gen:  Frail elderly F in NAD ?HEENT: moist mucous membranes ?CV: Regular rate and rhythm ?PULM: On 4LPM [place] ?ABD: soft/nontender ?EXT: RUE taught, tender, warm ?Neuro: Alert and oriented x3 ? ?SUMMARY OF RECOMMENDATIONS   ?DNAR/DNI ?  ?Differences between comfort care and aggressive medical treatment reviewed --> Patient and family  interested in treating what is treatable without aggressive invasive interventions. Would still like medical management - not ready for hospice.  ? ?Family want answer to anemia and why RUE is so swollen --> Primary team informed ?  ?PT/OT for generalized Weakness --> Recommended SNF  ? ?TOC involved for further conversations on dispo when the time comes ? ?Plan for OP Palliative support on discharge ?  ?Palliative Care will continue to follow along ?  ?MDM - High ?______________________________________________________________________________________ ?Tacey Ruiz ?[place] Team ?Team Cell Phone: [phone] ?Please utilize secure chat with additional questions, if there is no response within 30 minutes please call the above phone number ? ?Palliative Medicine Team providers are available by phone from 7am to 7pm daily and can be reached through the team cell phone.  ?Should this patient require assistance outside of these hours, please call the patient's attending physician. ? ? ? ? ?

## 2021-12-14 NOTE — Progress Notes (Addendum)
? ?Progress Note ? ?Patient Name: [name] [name] ?Date of Encounter: 12/14/2021 ? ?[facility] Cardiologist: Ida Rogue, MD  ? ?Subjective  ? ?[name] [name] denies chest pain.  She did have an episode of worsening shortness of breath overnight.  She states that she has not been able to take a deep breath.  She feels like pain from her shoulder is preventing her from taking a deep breath.  Her oxygen level was increased to 4 L nasal cannula and her shortness of breath resolved.  She also continues to have some right leg pain requiring some IV pain medications. ? ?Inpatient Medications  ?  ?Scheduled Meds: ? sodium chloride   Intravenous Once  ? sodium chloride   Intravenous Once  ? atorvastatin  40 mg Oral Daily  ? Chlorhexidine Gluconate Cloth  6 each Topical Daily  ? docusate sodium  100 mg Oral BID  ? ezetimibe  10 mg Oral QPM  ? feeding supplement  237 mL Oral BID BM  ? isosorbide mononitrate  15 mg Oral Daily  ? levothyroxine  50 mcg Oral Q0600  ? lidocaine  1 patch Transdermal Q24H  ? metoprolol tartrate  12.5 mg Oral BID  ? mirabegron ER  50 mg Oral Daily  ? mometasone-formoterol  2 puff Inhalation BID  ? multivitamin with minerals  1 tablet Oral Daily  ? nystatin cream   Topical BID  ? polyethylene glycol  17 g Oral Daily  ? senna-docusate  2 tablet Oral BID  ? sertraline  100 mg Oral Daily  ? ?Continuous Infusions: ? sodium chloride 30 mL/hr at 11/30/2021 1200  ? sodium chloride 1 mL/kg/hr (12/14/21 0147)  ? methocarbamol (ROBAXIN) IV    ? ?PRN Meds: ?sodium chloride, acetaminophen, bisacodyl, calcium carbonate, Glycerin (Adult), HYDROmorphone, ipratropium-albuterol, melatonin, menthol-cetylpyridinium **OR** phenol, methocarbamol **OR** methocarbamol (ROBAXIN) IV, metoCLOPramide **OR** metoCLOPramide (REGLAN) injection, nitroGLYCERIN, ondansetron **OR** ondansetron (ZOFRAN) IV  ? ?Vital Signs  ?  ?Vitals:  ? 12/14/21 1000 12/14/21 1100 12/14/21 1200 12/14/21 1300  ?BP: (!) 150/63 (!) 138/93 (!) 140/93  (!) 103/59  ?Pulse: 91 76 81 79  ?Resp: (!) 21 17 13 15   ?Temp:      ?TempSrc:      ?SpO2: 95% 96% 96% 97%  ?Weight:      ?Height:      ? ? ?Intake/Output Summary (Last 24 hours) at 12/14/2021 1553 ?Last data filed at 12/14/2021 1000 ?Gross per 24 hour  ?Intake 1150 ml  ?Output 1500 ml  ?Net -350 ml  ? ? ?Last 3 Weights 11/27/2021 11/16/2021 09/28/2021  ?Weight (lbs) 188 lb 0.8 oz 185 lb 180 lb 6 oz  ?Weight (kg) 85.3 kg 83.915 kg 81.818 kg  ?   ? ?Telemetry  ?  ?NSR, rare PVCs- Personally Reviewed ? ?ECG  ?  ?No new - Personally Reviewed ? ?Physical Exam  ? ?Vitals:  ? 12/14/21 1200 12/14/21 1300  ?BP: (!) 140/93 (!) 103/59  ?Pulse: 81 79  ?Resp: 13 15  ?Temp:    ?SpO2: 96% 97%  ? ? ?Physical Exam  ?Constitutional: No acute distress. Appears mildly uncomfortable -> mostly notes leg pain-and dyspnea ?HENT:  ?Head: Normocephalic and atraumatic.  ?Eyes: EOM are normal.  ?Neck: Normal range of motion.  ?Cardiovascular: Normal rate, regular rhythm, intact distal pulses. No gallop and no friction rub.  1/6 SEM at RUSB.  Mild non pitting edema in the feet.  ?Pulmonary: Non labored breathing on 4L [place], no wheezing or rales  ?Abdominal: Soft.  Normal bowel sounds. Non distended and non tender ?Musculoskeletal: RLE motion prohibited by pain.   ?   General: No tenderness. Mild non pitting edema of bilateral feet.   ?Neurological: Alert and oriented to person, place, and time. Non focal  ?Skin: Skin is warm and dry.  ? ? ? ?Labs  ?  ?High Sensitivity Troponin:   ?Recent Labs  ?Lab 12/11/21 ?1533 12/11/21 ?1839 12/11/21 ?2039 11/29/2021 ?1016  ?TROPONINIHS [id number]* [id number]*  ? ?   ?Chemistry ?Recent Labs  ?Lab 12/17/2021 ?0308 12/11/21 ?[id number] 12/11/2021 ?0202 11/30/2021 ?[id number] 12/14/21 ?0136  ?NA 138 138 138 132* 137  ?K 4.8 4.6 4.7 4.7 4.3  ?CL 104 106 103 101 101  ?CO2 28 25 29 27 30   ?GLUCOSE 157* 187* 132* 133* 123*  ?BUN 22 16 14 19 15   ?CREATININE 1.00 1.03* 0.78 0.84 0.71  ?CALCIUM 8.3* 7.7* 8.4* 7.7* 7.8*  ?MG 1.9  --   --    --   --   ?PROT 6.4* 5.4*  --  4.7* 5.2*  ?ALBUMIN 3.4* 3.0*  --  2.5* 2.7*  ?AST 20 25  --  78* 62*  ?ALT 15 14  --  11 13  ?ALKPHOS 99 74  --  69 68  ?BILITOT 0.5 0.3  --  0.5 1.0  ?GFRNONAA 59* 57* >60 >60 >60  ?ANIONGAP 6 7 6  4* 6  ? ?  ?Lipids No results for input(s): CHOL, TRIG, HDL, LABVLDL, LDLCALC, CHOLHDL in the last 168 hours.  ?Hematology ?Recent Labs  ?Lab 12/09/2021 ?0741 12/02/2021 ?2149 12/14/21 ?0136  ?WBC 9.1 8.9 8.0  ?RBC 2.06* 3.18* 2.99*  ?HGB 6.3* 9.2* 8.7*  ?HCT 19.4* 27.7* 26.3*  ?MCV 94.2 87.1 88.0  ?MCH 30.6 28.9 29.1  ?MCHC 32.5 33.2 33.1  ?RDW 15.6* 17.8* 17.9*  ?PLT 90* 98* 100*  ? ? ?Thyroid No results for input(s): TSH, FREET4 in the last 168 hours.  ?BNPNo results for input(s): BNP, PROBNP in the last 168 hours.  ?DDimer No results for input(s): DDIMER in the last 168 hours.  ? ?Radiology  ?  ?CT ABDOMEN PELVIS WO CONTRAST ? ?Result Date: 12/14/2021 ?CLINICAL DATA:  Abdominal pain EXAM: CT ABDOMEN AND PELVIS WITHOUT CONTRAST TECHNIQUE: Multidetector CT imaging of the abdomen and pelvis was performed following the standard protocol without IV contrast. RADIATION DOSE REDUCTION: This exam was performed according to the departmental dose-optimization program which includes automated exposure control, adjustment of the mA and/or kV according to patient size and/or use of iterative reconstruction technique. COMPARISON:  CT abdomen and pelvis dated August 01, 2015 FINDINGS: Lower chest: Trace right pleural effusion bibasilar atelectasis. Coronary artery calcifications and aortic valvular calcifications. Hepatobiliary: No focal liver abnormality is seen. Status post cholecystectomy. No biliary dilatation. Pancreas: Unremarkable. No pancreatic ductal dilatation or surrounding inflammatory changes. Spleen: Normal in size without focal abnormality. Adrenals/Urinary Tract: Bilateral adrenal glands are unremarkable. No hydronephrosis. Renal vascular calcifications with no definite nephrolithiasis.  Simple cyst of the lower pole of the right kidney. Bladder is unremarkable. Stomach/Bowel: Stomach is within normal limits. Severe sigmoid diverticulosis. No evidence of bowel wall thickening, distention, or inflammatory changes. Vascular/Lymphatic: Aortic atherosclerosis. No enlarged abdominal or pelvic lymph nodes. Reproductive: Uterus and bilateral adnexa are unremarkable. Other: Heterogeneous lesion of the right groin with areas of hyperdensity measuring 4.5 x 3.3 cm on series 8, image 76. Musculoskeletal: Intratrochanteric fracture of the right femur with postsurgical changes status post intramedullary nail placement. IMPRESSION: 1. Heterogeneous lesion of the right  groin, favored to be due to hematoma. However, given rounded appearance, recommend ultrasound for more definitive characterization since an enlarged lymph node could have a similar appearance. 2. Intratrochanteric fracture of the right femur status post intramedullary nail placement. 3. Aortic Atherosclerosis (ICD10-I70.0). Electronically Signed   By: Yetta Glassman M.D.   On: 12/14/2021 13:21  ? ?ECHOCARDIOGRAM COMPLETE ? ?Result Date: 12/14/2021 ?   ECHOCARDIOGRAM REPORT   Patient Name:   [name] [name] Date of Exam: 12/14/2021 Medical Rec #:  [id number]         Height:       57.0 in Accession #:    [id number]        Weight:       188.1 lb Date of Birth:  Feb 28, 1946        BSA:          1.752 m? Patient Age:    48 years          BP:           120/71 mmHg Patient Gender: F                 HR:           85 bpm. Exam Location:  Inpatient Procedure: 2D Echo, Cardiac Doppler, Color Doppler and Strain Analysis Indications:    Ischemic heart disease  History:        Patient has prior history of Echocardiogram examinations, most                 recent 12/23/2019. CHF, COPD and Stroke, Signs/Symptoms:Murmur;                 Risk Factors:Dyslipidemia, Hypertension and Diabetes. 12/02/2021                 cath.  Sonographer:    Luisa Hart RDCS Referring  Phys: [place] Comments: Suboptimal parasternal window, suboptimal apical window and suboptimal subcostal window. IMPRESSIONS  1. Left ventricular ejection fraction, by estimation,

## 2021-12-14 NOTE — Progress Notes (Signed)
Lower extremity venous RT study completed.   Please see CV Proc for preliminary results.   Joon Pohle, RDMS, RVT  

## 2021-12-14 NOTE — Evaluation (Signed)
Clinical/Bedside Swallow Evaluation ?Patient Details  ?Name: [name] [name] ?MRN: [id number] ?Date of Birth: [birth year] ? ?Today's Date: 12/14/2021 ?Time: SLP Start Time (ACUTE ONLY): [id number] SLP Stop Time (ACUTE ONLY): 1012 ?SLP Time Calculation (min) (ACUTE ONLY): 22 min ? ?Past Medical History:  ?Past Medical History:  ?Diagnosis Date  ? (HFpEF) heart failure with preserved ejection fraction ([place])   ? a. 11/2019 Echo: EF 60-65%, no rwma. Mild LVH. Mild AI, mild to mod AS.  ? Aneurysm ([place])   ? She had 2.6 cm dilation of the infrarenal abdominal aorta on 08/01/15 CTA with 5 year [place] recommended  ? Anxiety   ? Anxiety and depression   ? Aortic stenosis   ? mild-moderate AS 12/23/19 echo  ? Carotid stenosis   ? a. 06/2020 Carotid/Cerebral angio: LICA 70, L Vert 50, RCCA 50-60d, RICA 60-70p, 3.3x2.3 RICA aneurysm, RMCA M1 50-70.  ? CHF (congestive heart failure) ([place])   ? Compression fracture of body of thoracic vertebra (HCC)   ? T 10  ? Concussion 08/03/2015  ? COPD (chronic obstructive pulmonary disease) ([place]) 12/2012  ? spirometry: Pre: FVC 84%, FEV1 69%, ratio 0.64 consistent with moderate obstruction.  ? Depression   ? Fall 08/03/2015  ? d/c home health 08/2015  ? Fracture of cervical vertebra, C5 (HCC) 08/06/2015  ? History of chicken pox   ? History of stress test   ? a. 01/2019 MV: No isch/infarct. EF 71%.  ? Hyperlipidemia   ? Hypertension   ? Hypothyroidism   ? Lower back pain   ? h/o HNP s/p surgery  ? Neuropathy   ? B/L feet  ? On supplemental oxygen by nasal cannula   ? at HS and PRN during the day  ? Osteoarthritis   ? h/o ruptured disc s/p ESI  ? Osteoporosis 11/2010  ? DEXA -2.7 spine, thoracic compression fracture  ? Peripheral vascular disease ([place])   ? Smoker   ? quit 10/2012  ? Stroke [facility]) 2010  ? x3 with residual R hemiparesis, s/p R MCA balloon angioplasty (2010)  ? Wears dentures   ? upper  ? ?Past Surgical History:  ?Past Surgical History:  ?Procedure Laterality Date  ? APPENDECTOMY  1960  ? CATARACT  EXTRACTION    ? bilateral  ? CESAREAN SECTION    ? CHOLECYSTECTOMY  1970  ? COLONOSCOPY  2004  ? diverticulosis, no polyps Sharlett Iles)  ? COLONOSCOPY  05/2016  ? decreased sphincter tone, diverticulosis, no f/u recommended ([place])  ? DEXA  11/2010  ? T -2.7 spine, -1.9 hip  ? HIP SURGERY Left 2006  ? fractured - screws placed  ? INTRAMEDULLARY (IM) NAIL INTERTROCHANTERIC Right 12/06/2021  ? Procedure: ORIF INTRAMEDULLARY (IM) NAIL INTERTROCHANTRIC;  Surgeon: Rod Can, MD;  Location: [facility];  Service: Orthopedics;  Laterality: Right;  ? IR ANGIO INTRA EXTRACRAN SEL COM CAROTID INNOMINATE BILAT MOD SED  08/01/2018  ? IR ANGIO INTRA EXTRACRAN SEL COM CAROTID INNOMINATE BILAT MOD SED  12/10/2018  ? IR ANGIO INTRA EXTRACRAN SEL COM CAROTID INNOMINATE BILAT MOD SED  06/26/2020  ? IR ANGIO INTRA EXTRACRAN SEL COM CAROTID INNOMINATE BILAT MOD SED  04/02/2021  ? IR ANGIO VERTEBRAL SEL SUBCLAVIAN INNOMINATE BILAT MOD SED  04/02/2021  ? IR ANGIO VERTEBRAL SEL SUBCLAVIAN INNOMINATE UNI L MOD SED  06/26/2020  ? IR ANGIO VERTEBRAL SEL SUBCLAVIAN INNOMINATE UNI R MOD SED  08/01/2018  ? IR ANGIO VERTEBRAL SEL SUBCLAVIAN INNOMINATE UNI R MOD SED  12/10/2018  ? IR  ANGIO VERTEBRAL SEL VERTEBRAL UNI L MOD SED  12/10/2018  ? IR RADIOLOGIST EVAL & MGMT  01/09/2018  ? KYPHOPLASTY  10/02/2017  ? Procedure: LUMBAR FOUR KYPHOPLASTY;  Surgeon: Ashok Pall, MD;  Location: [facility];  Service: Neurosurgery;;  ? KYPHOPLASTY N/A 01/29/2020  ? Procedure: Thoracic Ten Kyphoplasty;  Surgeon: Ashok Pall, MD;  Location: [facility];  Service: Neurosurgery;  Laterality: N/A;  ? LEFT HEART CATH AND CORONARY ANGIOGRAPHY N/A 12/15/2021  ? Procedure: LEFT HEART CATH AND CORONARY ANGIOGRAPHY;  Surgeon: Martinique, Peter M, MD;  Location: [facility];  Service: Cardiovascular;  Laterality: N/A;  ? RADIOLOGY WITH ANESTHESIA N/A 11/11/2015  ? Procedure: RADIOLOGY WITH ANESTHESIA;  Surgeon: Luanne Bras, MD;  Location: [facility];  Service: Radiology;  Laterality: N/A;  ?  RADIOLOGY WITH ANESTHESIA N/A 12/10/2018  ? Procedure: STENTING;  Surgeon: Luanne Bras, MD;  Location: [facility];  Service: Radiology;  Laterality: N/A;  ? ?HPI:  ?76 year old female admitted after falling and sustaining right clavicle fracture as well as right proximal femur fracture. S/p intramedullary fixation of the right femur 3/18.  Experienced NSTEMI on 12/11/21. Cath Lab on 3/19 found to have single-vessel disease with heavily calcified more than 95% stenosis on her RCA, not able to be intervened upon. Cardiology following for potential further intervention vs medical management alone. Transferred to cardiovascular ICU. Palliative care following. PMHx obesity, HTN, PVD, CKD. Swallowing evaluated on 3/19 with recs for regular solids/thin liquids. New orders received 3/21 due to recurring intermittent choking episodes on solid food.  ?  ?Assessment / Plan / Recommendation  ?Clinical Impression ? [name] [name] was seen for a repeat clinical swallow assessment. Pt now in cardiovascular ICU. Her daughters were at bedside and report intermittent choking incidents with solid foods, leading to pt's face turning red followed by explosive coughing and food needing to be removed from her mouth. Difficulty occurs more frequently with foods like grits or spaghetti. Assessment today revealed similar function as prior - adequate mastication, palpable swallow response, no s/s of aspiration when tested with crackers with peanut butter, peaches, and water.   ? ?Given recurring bouts of choking, recommend proceeding with MBS to further evaluate oropharyngeal physiology and to screen the cervical esophagus.  Pt/family agree with plan. [name] [name] has a CT scheduled this afternoon, before which she will receive morphine for pain management.  Agreed to plan MBS for next date.  In the meantime, resume a regular diet, thin liquids - this will allow more options and her daughters can  select items that may be easier for pt to  swallow. D/W RN. SLP will follow. ? ?SLP Visit Diagnosis: Dysphagia, unspecified (R13.10) ?   ?Aspiration Risk ?   unknown ?  ?Diet Recommendation   Regular solids, thin liquids ? ?Medication Administration: Crushed with puree  ?  ?Other  Recommendations Oral Care Recommendations: Oral care BID   ? ?Recommendations for follow up therapy are one component of a multi-disciplinary discharge planning process, led by the attending physician.  Recommendations may be updated based on patient status, additional functional criteria and insurance authorization. ? ?Follow up Recommendations  (tba)  ? ? ?  ?Assistance Recommended at Discharge Frequent or constant Supervision/Assistance  ?Functional Status Assessment Patient has had a recent decline in their functional status and demonstrates the ability to make significant improvements in function in a reasonable and predictable amount of time.  ?Frequency and Duration    ?  ?  ?   ? ?Prognosis Prognosis for  Safe Diet Advancement: Good  ? ?  ? ?Swallow Study   ?General Date of Onset: 12/08/2021 ?HPI: 76 year old female admitted after falling and sustaining right clavicle fracture as well as right proximal femur fracture. S/p intramedullary fixation of the right femur 3/18.  Experienced NSTEMI on 12/11/21. Cath Lab on 3/19 found to have single-vessel disease with heavily calcified more than 95% stenosis on her RCA, not able to be intervened upon. Cardiology following for potential further intervention vs medical management alone. Transferred to cardiovascular ICU. Palliative care following. PMHx obesity, HTN, PVD, CKD. Swallowing evaluated on 3/19 with recs for regular solids/thin liquids. New orders received 3/21 due to recurring intermittent choking episodes on solid food. ?Type of Study: Bedside Swallow Evaluation ?Previous Swallow Assessment: see HPI ?Diet Prior to this Study: NPO ?Temperature Spikes Noted: No ?Behavior/Cognition: Alert ?Oral Cavity Assessment: Within  Functional Limits ?Oral Care Completed by SLP: No ?Oral Cavity - Dentition: Adequate natural dentition ?Vision: Functional for self-feeding ?Self-Feeding Abilities: Needs assist ?Patient Positioning: Upright in bed ?Base

## 2021-12-15 ENCOUNTER — Inpatient Hospital Stay (HOSPITAL_COMMUNITY): Payer: Medicare HMO

## 2021-12-15 DIAGNOSIS — I97631 Postprocedural hematoma of a circulatory system organ or structure following cardiac bypass: Secondary | ICD-10-CM

## 2021-12-15 DIAGNOSIS — J439 Emphysema, unspecified: Secondary | ICD-10-CM | POA: Diagnosis not present

## 2021-12-15 DIAGNOSIS — I5032 Chronic diastolic (congestive) heart failure: Secondary | ICD-10-CM | POA: Diagnosis not present

## 2021-12-15 DIAGNOSIS — N1831 Chronic kidney disease, stage 3a: Secondary | ICD-10-CM | POA: Diagnosis not present

## 2021-12-15 DIAGNOSIS — S72141A Displaced intertrochanteric fracture of right femur, initial encounter for closed fracture: Secondary | ICD-10-CM | POA: Diagnosis not present

## 2021-12-15 DIAGNOSIS — F331 Major depressive disorder, recurrent, moderate: Secondary | ICD-10-CM | POA: Diagnosis not present

## 2021-12-15 DIAGNOSIS — I10 Essential (primary) hypertension: Secondary | ICD-10-CM | POA: Diagnosis not present

## 2021-12-15 DIAGNOSIS — I214 Non-ST elevation (NSTEMI) myocardial infarction: Secondary | ICD-10-CM | POA: Diagnosis not present

## 2021-12-15 LAB — COMPREHENSIVE METABOLIC PANEL
ALT: 15 U/L (ref 0–44)
AST: 46 U/L — ABNORMAL HIGH (ref 15–41)
Albumin: 2.7 g/dL — ABNORMAL LOW (ref 3.5–5.0)
Alkaline Phosphatase: 67 U/L (ref 38–126)
Anion gap: 8 (ref 5–15)
BUN: 15 mg/dL (ref 8–23)
CO2: 27 mmol/L (ref 22–32)
Calcium: 8.4 mg/dL — ABNORMAL LOW (ref 8.9–10.3)
Chloride: 102 mmol/L (ref 98–111)
Creatinine, Ser: 0.63 mg/dL (ref 0.44–1.00)
GFR, Estimated: >60 mL/min (ref >60)
Glucose, Bld: 119 mg/dL — ABNORMAL HIGH (ref 70–99)
Potassium: 4.8 mmol/L (ref 3.5–5.1)
Sodium: 137 mmol/L (ref 135–145)
Total Bilirubin: 1 mg/dL (ref 0.3–1.2)
Total Protein: 5.7 g/dL — ABNORMAL LOW (ref 6.5–8.1)

## 2021-12-15 LAB — CBC
HCT: 29 % — ABNORMAL LOW (ref 36.0–46.0)
Hemoglobin: 9.4 g/dL — ABNORMAL LOW (ref 12.0–15.0)
MCH: 29.1 pg (ref 26.0–34.0)
MCHC: 32.4 g/dL (ref 30.0–36.0)
MCV: 89.8 fL (ref 80.0–100.0)
Platelets: 111 10*3/uL — ABNORMAL LOW (ref 150–400)
RBC: 3.23 MIL/uL — ABNORMAL LOW (ref 3.87–5.11)
RDW: 17.2 % — ABNORMAL HIGH (ref 11.5–15.5)
WBC: 7.8 10*3/uL (ref 4.0–10.5)
nRBC: 0 % (ref 0.0–0.2)

## 2021-12-15 LAB — PATHOLOGIST SMEAR REVIEW

## 2021-12-15 MED ORDER — HYDROMORPHONE HCL 2 MG PO TABS
2.0000 mg | ORAL_TABLET | ORAL | Status: DC | PRN
  Administered 2021-12-15 – 2021-12-19 (×17): 2 mg via ORAL
  Filled 2021-12-15 (×17): qty 1

## 2021-12-15 MED ORDER — FENTANYL CITRATE PF 50 MCG/ML IJ SOSY
12.5000 ug | PREFILLED_SYRINGE | INTRAMUSCULAR | Status: DC | PRN
  Administered 2021-12-15: 12.5 ug via INTRAVENOUS
  Administered 2021-12-16 – 2021-12-18 (×2): 25 ug via INTRAVENOUS
  Filled 2021-12-15 (×3): qty 1

## 2021-12-15 NOTE — TOC Progression Note (Addendum)
Transition of Care (TOC) - Progression Note  ? ? ?Patient Details  ?Name: [name] [name] ?MRN: [id number] ?Date of Birth: 03-Jun-1946 ? ?Transition of Care (TOC) CM/SW Contact  ?Lawerance Sabal, RN ?Phone Number: ?12/15/2021, 1:34 PM ? ?Clinical Narrative:   Spoke with Granderson,Tonya (Daughter) [phone]. ?She requested that referral be sent out to [facility] in [place] as the patient has been there before and they were satisfied with her care (sent in HUB). She states that they were planning to tour some of the other SNFs Friday, and I encouraged her to do so earlier as to avoid bed delays.  ?Discussed palliative services and she is agreeable to referral to be made to [facility], this has been done and they will follow.  ? ? ? ?Expected Discharge Plan: Skilled Nursing Facility ?Barriers to Discharge: Continued Medical Work up ? ?Expected Discharge Plan and Services ?Expected Discharge Plan: Skilled Nursing Facility ?In-house Referral: Clinical Social Work ?  ?  ?Living arrangements for the past 2 months: Single Family Home ?                ?  ?  ?  ?  ?  ?  ?  ?  ?  ?  ? ? ?Social Determinants of Health (SDOH) Interventions ?  ? ?Readmission Risk Interventions ? ?  12/26/2019  ?  1:04 PM  ?Readmission Risk Prevention Plan  ?Transportation Screening Complete  ?PCP or Specialist Appt within 5-7 Days Complete  ?Home Care Screening Complete  ?Medication Review (RN CM) Complete  ? ? ?

## 2021-12-15 NOTE — Progress Notes (Signed)
?                                                   ?Palliative Care Progress Note, Assessment & Plan  ? ?Patient Name: [name] [name]       Date: 12/15/2021 ?DOB: 1946/08/19  Age: 76 y.o. MRN#: [id number] ?Attending Physician: Kathlen Mody, MD ?Primary Care Physician: [name], MD ?Admit Date: 12/15/2021 ? ?Reason for Consultation/Follow-up: Establishing goals of care ? ?Subjective: ?Pt is lying in bed in NAD. She has [place] in place without respiratory distress. She acknolwedges my presence and is able to make her needs known. Both daughters are at bedside.  ? ?HPI: ?Pt is a 76 year old female with a PMH of HTN, PVD, CKD (3a), COPD, chronic diastolic CHF admitted to the hospital on 12/02/2021 after a fall at home.   ? ?She was found to have right clavicle fracture as well as right proximal femur fracture.  She was taken to the OR on 3/17 for intramedullary fixation of the right femur.   ? ?Patient experienced chest pain on 3/18 and found to have elevated troponin concerning for NSTEMI.  She was started on heparin drip.  She underwent left heart cath today which revealed single-vessel obstructive CAD (critical 95% proximal stenosis in the RCA in an angulated segment.  This is followed by a 90% stenosis in the mid vessel. Cardiology planning on repeat cardiac catheterization tomorrow to consider PCI of the RCA (high risk intervention given her calcified vessel).  ? ?Summary of counseling/coordination of care: ?After reviewing the patient's chart, assessed the patient at bedside.  Both nurse and 2 daughters were at bedside during my interactions.  I introduced myself and reiterated I was following up from my colleague Marcelino Duster. ? ?Reviewed pain management.  Discussed use of Dilaudid p.o. as well as Robaxin and Tylenol for synergistic effect for pain management.  Discussed  limited options due to patient's allergies and risk for bleeding/hematoma. Patient confirmed understanding by stating she just wants to "take the edge off" and knows that she will never "completely be out of pain". ? ?Daughters at bedside asked about results of echocardiogram and chest x-ray.  I conveyed that echocardiogram reflected 60 to 65% ejection fraction and that chest x-ray appeared to be stable from prior radiography. ? ?Daughters also shared concern that patient's oxygenation drops to 80s while sleeping at night.  Discussed COPD is a chronic and irreversible disease that impacts the patient's ability to exhale and blow off CO2.  Reviewed that use of incentive spirometry as well as flutter valve is useful in helping current lung function.  Reviewed that patient is now back to baseline supplemental oxygen. ? ?Discussed that pain management is an Psychologist, educational and a science.  I assured patient we would attempt to get her pain score down from an 8 or 9 to a 4 or a 5.  Also shared my goal was not to "know her under" but to walk the line of pain management versus sedation.  Patient said she understood and was hopeful to keep this balance as well.  Discussed importance of using p.o. medications to better manage the patient's pain to be able to continue this at discharge.  However, I also reviewed that the need for a IV pain medication would provide coverage for breakthrough pain.  Patient described feeling increased pain after  working with PT.  He uses an example of when an IV pain medication could better manage patient's pain.  Patient and daughters at bedside were in agreement. ? ?Questions and concerns were addressed.  Palliative medicine team will continue to follow the patient throughout her hospitalization. ? ?Code Status: ?DNR ? ?Prognosis: ?Unable to determine ? ?Discharge Planning: ?Skilled Nursing Facility for rehab with Palliative care service follow-up ? ?Recommendations/Plan: ?Dilaudid 2mg  PO Q3H ?Encouraged  nursing to give Robaxin and Tylenol more frequently for more consistent pain management ?Fentanyl 12.5-25mcg IV PRN Q2H for breakthrough pain ? ?Care plan was discussed with patient, patient's two daughters, nursing ? ?Physical Exam ?Vitals and nursing note reviewed.  ?Constitutional:   ?   General: She is not in acute distress. ?   Appearance: She is obese. She is not toxic-appearing.  ?HENT:  ?   Head: Normocephalic and atraumatic.  ?   Mouth/Throat:  ?   Pharynx: Oropharynx is clear.  ?Eyes:  ?   Pupils: Pupils are equal, round, and reactive to light.  ?Cardiovascular:  ?   Rate and Rhythm: Normal rate.  ?   Pulses: Normal pulses.  ?Pulmonary:  ?   Effort: Pulmonary effort is normal.  ?Abdominal:  ?   Palpations: Abdomen is soft.  ?Musculoskeletal:  ?   Comments: Generalized weakness, WBAT to right LE  ?Skin: ?   General: Skin is warm and dry.  ?Neurological:  ?   Mental Status: She is alert and oriented to person, place, and time.  ?Psychiatric:     ?   Mood and Affect: Mood normal.     ?   Behavior: Behavior normal.     ?   Thought Content: Thought content normal.     ?   Judgment: Judgment normal.  ?         ? ?Palliative Assessment/Data: 60% ? ? ? ?Total Time 45 minutes  ?Greater than 50%  of this time was spent counseling and coordinating care related to the above assessment and plan. ? ?Thank you for allowing the Palliative Medicine Team to assist in the care of this patient. ? ?Dalbert Mayotte. Josephanthony Tindel, DNP, FNP-BC ?Palliative Medicine Team ?Team Phone # [phone] ?  ?

## 2021-12-15 NOTE — Progress Notes (Signed)
Physical Therapy Treatment ?Patient Details ?Name: [name] [name] ?MRN: [id number] ?DOB: Jan 28, 1946 ?Today's Date: 12/15/2021 ? ? ?History of Present Illness Patient is a 76 y/o female who presents on 12/11/2021 s/p fall and found to have right clavicle fracture as well as right proximal femur fracture now s/p IM nail right femur 3/17. Found to have NSTEMI on 12/11/21. Cath Lab on 3/19 found to have single-vessel disease with heavily calcified more than 95% stenosis on her RCA, not able to be intervened upon. Cardiology following for potential further intervention vs medical management alone. PMH includes CVA, COPD, CHF, PVD, OA, osteoporosis. ? ?  ?PT Comments  ? ? Pt progressing towards her physical therapy goals; premedicated prior to session. Pt requiring two person maximal assist for bed mobility. BP supine 105/60, sitting 128/83. Once sitting edge of bed, pt with nausea and increased pain. Returned to supine to perform peri care due to bowel movement. Can likely progress to transferring to chair next session.  ?   ?Recommendations for follow up therapy are one component of a multi-disciplinary discharge planning process, led by the attending physician.  Recommendations may be updated based on patient status, additional functional criteria and insurance authorization. ? ?Follow Up Recommendations ? Skilled nursing-short term rehab (<3 hours/day) ?  ?  ?Assistance Recommended at Discharge Frequent or constant Supervision/Assistance  ?Patient can return home with the following Two people to help with walking and/or transfers;Two people to help with bathing/dressing/bathroom;Help with stairs or ramp for entrance;Assist for transportation;Assistance with cooking/housework ?  ?Equipment Recommendations ? None recommended by PT  ?  ?Recommendations for Other Services   ? ? ?  ?Precautions / Restrictions Precautions ?Precautions: Fall;Other (comment) ?Precaution Comments: watch BP esp after narcotics ?Required Braces or  Orthoses: Sling ?Restrictions ?Weight Bearing Restrictions: Yes ?RUE Weight Bearing: Weight bearing as tolerated ?RLE Weight Bearing: Non weight bearing  ?  ? ?Mobility ? Bed Mobility ?Overal bed mobility: Needs Assistance ?Bed Mobility: Supine to Sit, Sit to Supine, Rolling ?Rolling: Max assist ?  ?Supine to sit: Max assist, +2 for physical assistance ?Sit to supine: Max assist, +2 for physical assistance ?  ?General bed mobility comments: Cues for initiation and sequencing, requiring assist for RLE and trunk to upright. ?  ? ?Transfers ?  ?  ?  ?  ?  ?  ?  ?  ?  ?General transfer comment: deferred due to nausea/pt having BM ?  ? ?Ambulation/Gait ?  ?  ?  ?  ?  ?  ?  ?  ? ? ?Stairs ?  ?  ?  ?  ?  ? ? ?Wheelchair Mobility ?  ? ?Modified Rankin (Stroke Patients Only) ?  ? ? ?  ?Balance Overall balance assessment: Needs assistance ?Sitting-balance support: Feet supported, Single extremity supported ?Sitting balance-Leahy Scale: Poor ?Sitting balance - Comments: requiring supervision-min guard assist, reliant on LUE support ?  ?  ?  ?  ?  ?  ?  ?  ?  ?  ?  ?  ?  ?  ?  ?  ? ?  ?Cognition Arousal/Alertness: Awake/alert ?Behavior During Therapy: Flat affect ?Overall Cognitive Status: Impaired/Different from baseline ?Area of Impairment: Problem solving ?  ?  ?  ?  ?  ?  ?  ?  ?  ?  ?  ?  ?  ?  ?Problem Solving: Slow processing ?  ?  ?  ? ?  ?Exercises General Exercises - Upper Extremity ?Elbow Flexion: AROM, Right,  10 reps, Supine ?General Exercises - Lower Extremity ?Ankle Circles/Pumps: Both, 10 reps, Other reps (comment) ?Quad Sets: Right, 10 reps, Supine ? ?  ?General Comments   ?  ?  ? ?Pertinent Vitals/Pain Pain Assessment ?Pain Assessment: Faces ?Faces Pain Scale: Hurts whole lot ?Pain Location: "all over" ?Pain Descriptors / Indicators: Discomfort, Grimacing ?Pain Intervention(s): Limited activity within patient's tolerance, Monitored during session, Premedicated before session  ? ? ?Home Living   ?  ?  ?  ?  ?  ?   ?  ?  ?  ?   ?  ?Prior Function    ?  ?  ?   ? ?PT Goals (current goals can now be found in the care plan section) Acute Rehab PT Goals ?Patient Stated Goal: decrease pain ?Potential to Achieve Goals: Fair ?Progress towards PT goals: Progressing toward goals ? ?  ?Frequency ? ? ? Min 3X/week ? ? ? ?  ?PT Plan Frequency needs to be updated  ? ? ?Co-evaluation   ?  ?  ?  ?  ? ?  ?AM-PAC PT "6 Clicks" Mobility   ?Outcome Measure ? Help needed turning from your back to your side while in a flat bed without using bedrails?: A Lot ?Help needed moving from lying on your back to sitting on the side of a flat bed without using bedrails?: Total ?Help needed moving to and from a bed to a chair (including a wheelchair)?: Total ?Help needed standing up from a chair using your arms (e.g., wheelchair or bedside chair)?: Total ?Help needed to walk in hospital room?: Total ?Help needed climbing 3-5 steps with a railing? : Total ?6 Click Score: 7 ? ?  ?End of Session   ?Activity Tolerance: Patient limited by pain ?Patient left: in bed;with call bell/phone within reach;with bed alarm set;with family/visitor present ?Nurse Communication: Mobility status ?PT Visit Diagnosis: Pain;Muscle weakness (generalized) (M62.81) ?Pain - Right/Left: Right ?Pain - part of body: Leg ?  ? ? ?Time: [id number] ?PT Time Calculation (min) (ACUTE ONLY): 33 min ? ?Charges:  $Therapeutic Activity: 23-37 mins          ?          ? ?[name] [name], PT, DPT ?Acute Rehabilitation Services ?Pager [phone] ?Office [phone] ? ? ? ?[name] [name] ?12/15/2021, 4:33 PM ? ?

## 2021-12-15 NOTE — Progress Notes (Signed)
? ?Progress Note ? ?Patient Name: [name] [name] ?Date of Encounter: 12/15/2021 ? ?[facility] Cardiologist: Ida Rogue, MD  ? ?Subjective  ? ?[name] [name] denies chest pain. She did have some shortness of breath overnight but has been stable on 4L [place]. Patient had some trouble swallowing her food per her daughters and they were concerned she might be aspirating.  ?Inpatient Medications  ?  ?Scheduled Meds: ? sodium chloride   Intravenous Once  ? sodium chloride   Intravenous Once  ? atorvastatin  40 mg Oral Daily  ? Chlorhexidine Gluconate Cloth  6 each Topical Daily  ? docusate sodium  100 mg Oral BID  ? ezetimibe  10 mg Oral QPM  ? feeding supplement  237 mL Oral BID BM  ? isosorbide mononitrate  15 mg Oral Daily  ? levothyroxine  50 mcg Oral Q0600  ? lidocaine  1 patch Transdermal Q24H  ? metoprolol tartrate  12.5 mg Oral BID  ? mirabegron ER  50 mg Oral Daily  ? mometasone-formoterol  2 puff Inhalation BID  ? multivitamin with minerals  1 tablet Oral Daily  ? nystatin cream   Topical BID  ? polyethylene glycol  17 g Oral Daily  ? senna-docusate  2 tablet Oral BID  ? sertraline  100 mg Oral Daily  ? ?Continuous Infusions: ? sodium chloride 30 mL/hr at 12/02/2021 1200  ? sodium chloride 1 mL/kg/hr (12/14/21 0147)  ? methocarbamol (ROBAXIN) IV    ? ?PRN Meds: ?sodium chloride, acetaminophen, bisacodyl, calcium carbonate, fentaNYL (SUBLIMAZE) injection, Glycerin (Adult), HYDROmorphone, ipratropium-albuterol, melatonin, menthol-cetylpyridinium **OR** phenol, methocarbamol **OR** methocarbamol (ROBAXIN) IV, metoCLOPramide **OR** metoCLOPramide (REGLAN) injection, nitroGLYCERIN, ondansetron **OR** ondansetron (ZOFRAN) IV  ? ?Vital Signs  ?  ?Vitals:  ? 12/15/21 1400 12/15/21 1500 12/15/21 1600 12/15/21 1639  ?BP: 95/81 (!) 91/51 (!) 94/57   ?Pulse: 79 79 81   ?Resp: 16 17 19    ?Temp:    98.8 ?F (37.1 ?C)  ?TempSrc:    Oral  ?SpO2: 93% 94% 91%   ?Weight:      ?Height:      ? ? ?Intake/Output Summary (Last 24  hours) at 12/15/2021 1647 ?Last data filed at 12/15/2021 1600 ?Gross per 24 hour  ?Intake 750 ml  ?Output 2300 ml  ?Net -1550 ml  ? ? ? ?  12/18/2021  ?  9:00 PM 11/16/2021  ? 11:55 AM 09/28/2021  ?  2:02 PM  ?Last 3 Weights  ?Weight (lbs) 188 lb 0.8 oz 185 lb 180 lb 6 oz  ?Weight (kg) 85.3 kg 83.915 kg 81.818 kg  ?   ? ?Telemetry  ?  ?NSR, rare PVCs- Personally Reviewed ? ?ECG  ?  ?No new - Personally Reviewed ? ?Physical Exam  ? ?Vitals:  ? 12/15/21 1600 12/15/21 1639  ?BP: (!) 94/57   ?Pulse: 81   ?Resp: 19   ?Temp:  98.8 ?F (37.1 ?C)  ?SpO2: 91%   ? ?Constitutional: No acute distress.  ?HENT:  ?Head: Normocephalic and atraumatic.  ?Neck: Normal range of motion.  ?Cardiovascular: Normal rate, regular rhythm, intact distal pulses. No gallop and no friction rub.  ?No murmur heard. Mild non pitting edema of bilateral feet.   ?Pulmonary: Non labored breathing on4 L , no wheezing or rales  ?Abdominal: Soft. Normal bowel sounds. Non distended and non tender ?Musculoskeletal: Normal range of motion.     ?   General: No tenderness  ?Neurological: Alert and oriented to person, place, and time. Non focal  ?  Skin: Skin is warm and dry.  ? ?Labs  ?  ?High Sensitivity Troponin:   ?Recent Labs  ?Lab 12/11/21 ?1533 12/11/21 ?1839 12/11/21 ?2039 12/17/2021 ?1016  ?TROPONINIHS [id number]* [id number]*  ? ?   ?Chemistry ?Recent Labs  ?Lab 12/24/2021 ?0308 12/11/21 ?[id number] 11/26/2021 ?0242 12/14/21 ?0136 12/15/21 ?[id number]  ?NA 138   < > 132* 137 137  ?K 4.8   < > 4.7 4.3 4.8  ?CL 104   < > 101 101 102  ?CO2 28   < > 27 30 27   ?GLUCOSE 157*   < > 133* 123* 119*  ?BUN 22   < > 19 15 15   ?CREATININE 1.00   < > 0.84 0.71 0.63  ?CALCIUM 8.3*   < > 7.7* 7.8* 8.4*  ?MG 1.9  --   --   --   --   ?PROT 6.4*   < > 4.7* 5.2* 5.7*  ?ALBUMIN 3.4*   < > 2.5* 2.7* 2.7*  ?AST 20   < > 78* 62* 46*  ?ALT 15   < > 11 13 15   ?ALKPHOS 99   < > 69 68 67  ?BILITOT 0.5   < > 0.5 1.0 1.0  ?GFRNONAA 59*   < > >60 >60 >60  ?ANIONGAP 6   < > 4* 6 8  ? < > = values in this  interval not displayed.  ? ?  ?Lipids No results for input(s): CHOL, TRIG, HDL, LABVLDL, LDLCALC, CHOLHDL in the last 168 hours.  ?Hematology ?Recent Labs  ?Lab 12/17/2021 ?2149 12/14/21 ?0136 12/15/21 ?0434  ?WBC 8.9 8.0 7.8  ?RBC 3.18* 2.99* 3.23*  ?HGB 9.2* 8.7* 9.4*  ?HCT 27.7* 26.3* 29.0*  ?MCV 87.1 88.0 89.8  ?MCH 28.9 29.1 29.1  ?MCHC 33.2 33.1 32.4  ?RDW 17.8* 17.9* 17.2*  ?PLT 98* 100* 111*  ? ? ?Thyroid No results for input(s): TSH, FREET4 in the last 168 hours.  ?BNPNo results for input(s): BNP, PROBNP in the last 168 hours.  ?DDimer No results for input(s): DDIMER in the last 168 hours.  ? ?Radiology  ?  ?CT ABDOMEN PELVIS WO CONTRAST ? ?Result Date: 12/14/2021 ?CLINICAL DATA:  Abdominal pain EXAM: CT ABDOMEN AND PELVIS WITHOUT CONTRAST TECHNIQUE: Multidetector CT imaging of the abdomen and pelvis was performed following the standard protocol without IV contrast. RADIATION DOSE REDUCTION: This exam was performed according to the departmental dose-optimization program which includes automated exposure control, adjustment of the mA and/or kV according to patient size and/or use of iterative reconstruction technique. COMPARISON:  CT abdomen and pelvis dated August 01, 2015 FINDINGS: Lower chest: Trace right pleural effusion bibasilar atelectasis. Coronary artery calcifications and aortic valvular calcifications. Hepatobiliary: No focal liver abnormality is seen. Status post cholecystectomy. No biliary dilatation. Pancreas: Unremarkable. No pancreatic ductal dilatation or surrounding inflammatory changes. Spleen: Normal in size without focal abnormality. Adrenals/Urinary Tract: Bilateral adrenal glands are unremarkable. No hydronephrosis. Renal vascular calcifications with no definite nephrolithiasis. Simple cyst of the lower pole of the right kidney. Bladder is unremarkable. Stomach/Bowel: Stomach is within normal limits. Severe sigmoid diverticulosis. No evidence of bowel wall thickening, distention, or  inflammatory changes. Vascular/Lymphatic: Aortic atherosclerosis. No enlarged abdominal or pelvic lymph nodes. Reproductive: Uterus and bilateral adnexa are unremarkable. Other: Heterogeneous lesion of the right groin with areas of hyperdensity measuring 4.5 x 3.3 cm on series 8, image 76. Musculoskeletal: Intratrochanteric fracture of the right femur with postsurgical changes status post intramedullary nail placement. IMPRESSION: 1. Heterogeneous lesion of  the right groin, favored to be due to hematoma. However, given rounded appearance, recommend ultrasound for more definitive characterization since an enlarged lymph node could have a similar appearance. 2. Intratrochanteric fracture of the right femur status post intramedullary nail placement. 3. Aortic Atherosclerosis (ICD10-I70.0). Electronically Signed   By: Yetta Glassman M.D.   On: 12/14/2021 13:21  ? ?DG CHEST PORT 1 VIEW ? ?Result Date: 12/15/2021 ?CLINICAL DATA:  Increased oxygen demand. EXAM: PORTABLE CHEST 1 VIEW COMPARISON:  Prior chest radiograph 12/10/2021. FINDINGS: Cardiomegaly. Aortic atherosclerosis. Persistent mild ill-defined opacity within the left greater than right lung bases. Possible trace left pleural effusion. No evidence of pneumothorax. Known right clavicular fracture and acute nondisplaced fracture of the posterolateral right fourth rib, better appreciated on the recent prior CT of the right shoulder performed 12/07/2021. Prior T9 vertebral augmentation. IMPRESSION: Mild ill-defined opacity within the left greater than right lung bases, not significantly changed and favored to reflect atelectasis. Known acute fractures of the right clavicle and posterolateral right fourth rib, better appreciated on the recent prior right shoulder CT of 12/15/2021. Cardiomegaly. Aortic Atherosclerosis (ICD10-I70.0). Electronically Signed   By: Kellie Simmering D.O.   On: 12/15/2021 10:49  ? ?DG Swallowing Func-Speech Pathology ? ?Result Date:  12/15/2021 ?Table formatting from the original result was not included. Images from the original result were not included. Objective Swallowing Evaluation: Type of Study: MBS-Modified Barium Swallow Study Completed by Raliegh Ip

## 2021-12-15 NOTE — Progress Notes (Signed)
?PROGRESS NOTE ? ? ? ?[name] [name]  [id number] DOB: 09/25/1946 DOA: 12/03/2021 ?PCP: Ria Bush, MD  ? ? ?Chief Complaint  ?Patient presents with  ? Fall  ? ? ?Brief Narrative:  ?76 year old lady with prior history of hypertension stage III CKD peripheral vascular disease comes in after a mechanical fall.  She was found to have right clavicle fracture as well as a right proximal femur fracture.  Orthopedics consulted and she underwent intramedullary fixation of the right femur,.  Hospital course complicated by postoperative NSTEMI as well as persistent acute blood loss anemia secondary to hematoma formation. ?Patient underwent urgent cath which showed heavily calcified RCA lesion was not intervened, recommended outpatient follow-up with cardiology on discharge. ? ?Assessment & Plan: ?  ?Principal Problem: ?  Closed intertrochanteric fracture of hip, right, initial encounter ([place]) ?Active Problems: ?  Right clavicle fracture ?  Hypertension ?  Peripheral vascular disease ([place]) ?  COPD (chronic obstructive pulmonary disease) ([place]) ?  MDD (major depressive disorder), recurrent episode, moderate ([place]) ?  Osteoporosis ?  Hypothyroidism (acquired) ?  Chronic diastolic heart failure ([place]) ?  Intertrigo ?  Stage 3a chronic kidney disease (CKD) ([place]) ?  Neuropathy ?  NSTEMI (non-ST elevated myocardial infarction) ([place]) ? ? ?NSTEMI ?Patient reported chest pain postsurgery. ?EKG showed depressed ST segments in the lateral leads. ?Troponins were elevated.  She underwent cardiac cath was found to have a severe single-vessel disease with heavily calcified more than 95% stenosis on her RCA.  This could not be intervened upon.  Cardiology recommended follow-up as an outpatient. She is not a candidate for any invasive treatment.  ?Currently not on aspirin, Plavix or heparin due to acute blood loss anemia. ?Patient currently denies any chest pain or shortness of breath. ? ? ?Closed intertrochanteric fracture of the  right hip ?Orthopedics consulted and she was taken to the OR on 3/17 by Dr. Lyla Glassing.  She is s/p intramedullary fixation of the right femur. ?Weightbearing as tolerated. ?Therapy evaluations recommending SNF. ?Pain control with oral medications at this time. ? ? ?Acute anemia of blood loss s/p 3 units of PRBC transfusion. ?Repeat hemoglobin stable around 9.  CT of the abdomen and pelvis does not show retroperitoneal bleed.Heterogeneous lesion of the right groin, favored to be due to hematoma. However, given rounded appearance, recommend ultrasound ?for more definitive characterization since an enlarged lymph node could have a similar appearance. US of the right groin ordered for further evaluation.  ? ?Right lower extremity swelling-likely related to recent surgery/immobility, but ruled out DVT with a Doppler ultrasound. ? ? ?Chronic diastolic heart failure ?She appears to be compensated. ?Continue with strict intake and output and daily weights. ? ?Hyperlipidemia ?Continue with statin and Zetia. ? ? ? ?History of COPD ?No wheezing heard on exam.  On nasal cannula oxygen. ? ? ? ?Peripheral vascular disease ?Continue with aspirin and Plavix. ? ? ? ?Essential hypertension ?Blood pressure parameters are normal low. ? ? ? ?Stage IIIa CKD ?Creatinine appears to be at baseline, continue to monitor. ? ? ? ?Right clavicular fracture ?Continue with the sling on the right arm ?Nonweightbearing recommended. ? ? ? ? ?DVT prophylaxis:  ?Code Status: DNR ?Family Communication: family at bedside.  ?Disposition:  ? ?Status is: Inpatient ?Remains inpatient appropriate because: watch hemoglobin for another 24 hours.  ?  ?Consultants:  ?Cardiology ?Orthopedics.  ? ?Procedures: status post intramedullary fixation of the right femur ? ?Antimicrobials:  ?Antibiotics Given (last 72 hours)   ? ? None  ? ?  ? ? ? ? ?  Subjective: ?Some groin discomfort. No chest pain or sob.  ? ?Objective: ?Vitals:  ? 12/15/21 1058 12/15/21 1100 12/15/21  1200 12/15/21 1300  ?BP:  (!) 106/53 (!) 87/60 (!) 108/51  ?Pulse:  85 83 81  ?Resp:  16 18 19   ?Temp: 97.6 ?F (36.4 ?C)     ?TempSrc: Oral     ?SpO2:  96% 93% 93%  ?Weight:      ?Height:      ? ? ?Intake/Output Summary (Last 24 hours) at 12/15/2021 1356 ?Last data filed at 12/15/2021 1300 ?Gross per 24 hour  ?Intake 870 ml  ?Output 2000 ml  ?Net -1130 ml  ? ?Filed Weights  ? 11/27/2021 2100  ?Weight: 85.3 kg  ? ? ?Examination: ? ?General exam: Appears calm and comfortable  ?Respiratory system: Clear to auscultation. Respiratory effort normal. On [place] oxygen.  ?Cardiovascular system: S1 & S2 heard, RRR. No JVD, murmurs, rubs, gallops or clicks. No pedal edema. ?Gastrointestinal system: Abdomen is nondistended, soft and nontender. Normal bowel sounds heard. ?Central nervous system: Alert and oriented. No focal neurological deficits. ?Extremities: Symmetric 5 x 5 power. ?Skin: No rashes, lesions or ulcers ?Psychiatry: Mood & affect appropriate.  ? ? ? ?Data Reviewed: I have personally reviewed following labs and imaging studies ? ?CBC: ?Recent Labs  ?Lab 12/17/2021 ?1830 12/08/2021 ?0308 12/11/21 ?[id number] 12/01/2021 ?0202 11/26/2021 ?[id number] 12/24/2021 ?2149 12/14/21 ?0136 12/15/21 ?0434  ?WBC 10.9* 10.7*   < > 13.0* 9.1 8.9 8.0 7.8  ?NEUTROABS 7.0 8.8*  --   --   --   --   --   --   ?HGB 12.4 9.8*   < > 8.7* 6.3* 9.2* 8.7* 9.4*  ?HCT 43.2 31.9*   < > 27.9* 19.4* 27.7* 26.3* 29.0*  ?MCV 105.6* 97.3   < > 94.9 94.2 87.1 88.0 89.8  ?PLT 186 172   < > 132* 90* 98* 100* 111*  ? < > = values in this interval not displayed.  ? ? ?Basic Metabolic Panel: ?Recent Labs  ?Lab 11/27/2021 ?0308 12/11/21 ?[id number] 12/18/2021 ?0202 11/24/2021 ?[id number] 12/14/21 ?0136 12/15/21 ?[id number]  ?NA 138 138 138 132* 137 137  ?K 4.8 4.6 4.7 4.7 4.3 4.8  ?CL 104 106 103 101 101 102  ?CO2 28 25 29 27 30 27   ?GLUCOSE 157* 187* 132* 133* 123* 119*  ?BUN 22 16 14 19 15 15   ?CREATININE 1.00 1.03* 0.78 0.84 0.71 0.63  ?CALCIUM 8.3* 7.7* 8.4* 7.7* 7.8* 8.4*  ?MG 1.9  --   --   --   --   --    ? ? ?GFR: ?Estimated Creatinine Clearance: 55 mL/min (by C-G formula based on SCr of 0.63 mg/dL). ? ?Liver Function Tests: ?Recent Labs  ?Lab 11/25/2021 ?0308 12/11/21 ?[id number] 11/29/2021 ?0242 12/14/21 ?0136 12/15/21 ?[id number]  ?AST 20 25 78* 62* 46*  ?ALT 15 14 11 13 15   ?ALKPHOS 99 74 69 68 67  ?BILITOT 0.5 0.3 0.5 1.0 1.0  ?PROT 6.4* 5.4* 4.7* 5.2* 5.7*  ?ALBUMIN 3.4* 3.0* 2.5* 2.7* 2.7*  ? ? ?CBG: ?Recent Labs  ?Lab 12/11/2021 ?1552  ?GLUCAP 128*  ? ? ? ?Recent Results (from the past 240 hour(s))  ?MRSA Next Gen by PCR, Nasal     Status: None  ? Collection Time: 12/19/2021 12:29 AM  ? Specimen: Nasal Mucosa; Nasal Swab  ?Result Value Ref Range Status  ? MRSA by PCR Next Gen NOT DETECTED NOT DETECTED Final  ?  Comment: (NOTE) ?The GeneXpert MRSA Assay (FDA  approved for NASAL specimens only), ?is one component of a comprehensive MRSA colonization surveillance ?program. It is not intended to diagnose MRSA infection nor to guide ?or monitor treatment for MRSA infections. ?Test performance is not FDA approved in patients less than 2 years ?old. ?Performed at [facility], [place] [address], [place], [place] ?[address] ?  ?  ? ? ? ? ? ?Radiology Studies: ?CT ABDOMEN PELVIS WO CONTRAST ? ?Result Date: 12/14/2021 ?CLINICAL DATA:  Abdominal pain EXAM: CT ABDOMEN AND PELVIS WITHOUT CONTRAST TECHNIQUE: Multidetector CT imaging of the abdomen and pelvis was performed following the standard protocol without IV contrast. RADIATION DOSE REDUCTION: This exam was performed according to the departmental dose-optimization program which includes automated exposure control, adjustment of the mA and/or kV according to patient size and/or use of iterative reconstruction technique. COMPARISON:  CT abdomen and pelvis dated August 01, 2015 FINDINGS: Lower chest: Trace right pleural effusion bibasilar atelectasis. Coronary artery calcifications and aortic valvular calcifications. Hepatobiliary: No focal liver abnormality is seen. Status post  cholecystectomy. No biliary dilatation. Pancreas: Unremarkable. No pancreatic ductal dilatation or surrounding inflammatory changes. Spleen: Normal in size without focal abnormality. Adrenals/Urinary Tract: Bil

## 2021-12-15 NOTE — Progress Notes (Signed)
Modified Barium Swallow Progress Note ? ?Patient Details  ?Name: [name] [name] ?MRN: [id number] ?Date of Birth: 1946/05/01 ? ?Today's Date: 12/15/2021 ? ?Modified Barium Swallow completed.  Full report located under Chart Review in the Imaging Section. ? ?Brief recommendations include the following: ? ?Clinical Impression ? Patient was seen for MBS due to complaints of choking on solids. MBS revealed that there are no instances of aspiration, or oropharyngeal dysphagia. Mastication was slightly prolonged with regular texture graham cracker, but pt displayed adequate lateralization, propulsion, BOT tongue retraction and oral clearance across consistencies and textures. Initiation of the swallow sequence occurred at the level of the pyriform sinuses with thin liquids, indicating a mild timing issue when initiating pharyngeal swallow with thin liquids. Pt initiated pharyngeal swallow sequence at the level of the valleculae for puree and regular textures, which is WNL. Epiglottic inversion and laryngeal vestibule closure present. Flash penetration noted with thin liquids, however pt independently and spontaneously resolved penetrates. SLP recommends GI consult for suspected esophageal dysmotility. Pt may resume regular diet with thin liquids at this time. ?  ?Swallow Evaluation Recommendations ? ? Recommended Consults: Consider GI evaluation;Consider esophageal assessment ? ? SLP Diet Recommendations: Regular solids;Thin liquid ? ? Liquid Administration via: Straw;Cup ? ? Medication Administration: Whole meds with liquid ? ? Supervision: Staff to assist with self feeding ? ? Compensations: Minimize environmental distractions;Slow rate;Small sips/bites ? ? Postural Changes: Remain semi-upright after after feeds/meals (Comment);Seated upright at 90 degrees ? ? Oral Care Recommendations: Oral care BID ? ?   ? ? ? ?Vaughan Sine ?12/15/2021,10:16 AM ?

## 2021-12-15 NOTE — Progress Notes (Signed)
Placed patient on CPAP vai FFM, auto settings (max 18, min 6) cm H20 with 5L 02 bleed in.  ?

## 2021-12-16 ENCOUNTER — Inpatient Hospital Stay (HOSPITAL_COMMUNITY): Payer: Medicare HMO

## 2021-12-16 DIAGNOSIS — F331 Major depressive disorder, recurrent, moderate: Secondary | ICD-10-CM | POA: Diagnosis not present

## 2021-12-16 DIAGNOSIS — G629 Polyneuropathy, unspecified: Secondary | ICD-10-CM

## 2021-12-16 DIAGNOSIS — M810 Age-related osteoporosis without current pathological fracture: Secondary | ICD-10-CM

## 2021-12-16 DIAGNOSIS — J439 Emphysema, unspecified: Secondary | ICD-10-CM | POA: Diagnosis not present

## 2021-12-16 DIAGNOSIS — I5032 Chronic diastolic (congestive) heart failure: Secondary | ICD-10-CM | POA: Diagnosis not present

## 2021-12-16 DIAGNOSIS — S72141A Displaced intertrochanteric fracture of right femur, initial encounter for closed fracture: Secondary | ICD-10-CM | POA: Diagnosis not present

## 2021-12-16 LAB — CBC
HCT: 28.7 % — ABNORMAL LOW (ref 36.0–46.0)
Hemoglobin: 9.1 g/dL — ABNORMAL LOW (ref 12.0–15.0)
MCH: 28.7 pg (ref 26.0–34.0)
MCHC: 31.7 g/dL (ref 30.0–36.0)
MCV: 90.5 fL (ref 80.0–100.0)
Platelets: 129 10*3/uL — ABNORMAL LOW (ref 150–400)
RBC: 3.17 MIL/uL — ABNORMAL LOW (ref 3.87–5.11)
RDW: 16.7 % — ABNORMAL HIGH (ref 11.5–15.5)
WBC: 7.3 10*3/uL (ref 4.0–10.5)
nRBC: 0 % (ref 0.0–0.2)

## 2021-12-16 LAB — URIC ACID: Uric Acid, Serum: 3.8 mg/dL (ref 2.5–7.1)

## 2021-12-16 MED ORDER — ACETAMINOPHEN 325 MG PO TABS
650.0000 mg | ORAL_TABLET | Freq: Four times a day (QID) | ORAL | Status: DC
  Administered 2021-12-16 – 2021-12-20 (×17): 650 mg via ORAL
  Filled 2021-12-16 (×16): qty 2

## 2021-12-16 MED ORDER — ACETAMINOPHEN 325 MG PO TABS
650.0000 mg | ORAL_TABLET | ORAL | Status: AC
  Administered 2021-12-16: 650 mg via ORAL
  Filled 2021-12-16: qty 2

## 2021-12-16 MED ORDER — METHOCARBAMOL 500 MG PO TABS
500.0000 mg | ORAL_TABLET | Freq: Four times a day (QID) | ORAL | Status: DC
  Administered 2021-12-16 – 2021-12-20 (×17): 500 mg via ORAL
  Filled 2021-12-16 (×18): qty 1

## 2021-12-16 MED ORDER — METHOCARBAMOL 1000 MG/10ML IJ SOLN
500.0000 mg | Freq: Four times a day (QID) | INTRAMUSCULAR | Status: DC | PRN
  Filled 2021-12-16: qty 5

## 2021-12-16 MED ORDER — METHOCARBAMOL 500 MG PO TABS: 500.0000 mg | ORAL_TABLET | Freq: Four times a day (QID) | ORAL | Status: DC

## 2021-12-16 NOTE — Plan of Care (Signed)

## 2021-12-16 NOTE — TOC Progression Note (Addendum)
Transition of Care (TOC) - Progression Note  ? ? ?Patient Details  ?Name: [name] [name] ?MRN: [id number] ?Date of Birth: [birth year] ? ?Transition of Care (TOC) CM/SW Contact  ?Joanne Chars, LCSW ?Phone Number: ?12/16/2021, 10:41 AM ? ?Clinical Narrative:   ?CSW contacted [facility] and she does offer a bed. ? ?CSW met with pt and [name] and presented bed offers, discussed potential DC as soon as tomorrow.  Daughter reports that she is planning to visit [facility] tomorrow and will make choice at that time.  Daughter does not believe pt ready for DC tomorrow and will speak with MD.     ? ?CSW spoke with [facility] and they do not manage this pt for SNF auth.  ? ? ?Expected Discharge Plan: [place] ?Barriers to Discharge: Continued Medical Work up ? ?Expected Discharge Plan and Services ?Expected Discharge Plan: [place] ?In-house Referral: Clinical Social Work ?  ?  ?Living arrangements for the past 2 months: [place] ?                ?  ?  ?  ?  ?  ?  ?  ?  ?  ?  ? ? ?Social Determinants of Health (SDOH) Interventions ?  ? ?Readmission Risk Interventions ? ?  12/26/2019  ?  1:04 PM  ?Readmission Risk Prevention Plan  ?Transportation Screening Complete  ?PCP or Specialist Appt within 5-7 Days Complete  ?Home Care Screening Complete  ?Medication Review (RN CM) Complete  ? ? ?

## 2021-12-16 NOTE — Care Management Important Message (Signed)
Important Message ? ?Patient Details  ?Name: [name] [name] ?MRN: [id number] ?Date of Birth: 1946/08/24 ? ? ?Medicare Important Message Given:  Yes ? ? ? ? ?[name]  [name] ?12/16/2021, 11:59 AM ?

## 2021-12-16 NOTE — Progress Notes (Signed)
Pt slept fairly well for latter part of shift. Pt noted with difficulty with CPAP mask, frequently awakening throughout night requesting that her daughter assist. Pt noted with O2 sats 87%-88% once asleep while on CPAP. However when awakens, O2 sats immediately improve and run in low 90s. Pt switched to O2 mask per daughter's request. Pt noted to be sustaining O2 sats 92%-98% through the rest of the night. Medicated x2 with pain med for reports of right hip pain-partial effects noted.  ?

## 2021-12-16 NOTE — Progress Notes (Signed)
Patient daughter requested that O2 up to 4liter with face mask. Stated that patient could tolerate the c-pap. Deairra Halleck BrownLPN ?

## 2021-12-16 NOTE — Progress Notes (Incomplete)
Progress note

## 2021-12-16 NOTE — Progress Notes (Signed)
Pt states she is not able to wear CPAP. ?

## 2021-12-16 NOTE — Progress Notes (Addendum)
[facility] Hospital Liaison Note  Notified by TOC manager of patient/family request for [facility] palliative services at SNF after discharge.   [facility] hospital liaison will follow patient for discharge disposition.   Please call with any hospice or outpatient palliative care related questions.   Thank you for the opportunity to participate in this patient's care.   Shanita Wicker, LCSW [facility] Hospital Liaison [phone]  

## 2021-12-16 NOTE — Addendum Note (Signed)
Addendum  created 12/16/21 1006 by Marcene Duos, MD  ? Clinical Note Signed, Intraprocedure Blocks edited, Intraprocedure Event edited, Intraprocedure Staff edited  ?  ?

## 2021-12-16 NOTE — Progress Notes (Signed)
Occupational Therapy Treatment ?Patient Details ?Name: [name] [name] ?MRN: [id number] ?DOB: 09-12-1946 ?Today's Date: 12/16/2021 ? ? ?History of present illness Patient is a 76 y/o female who presents on 12/28/2021 s/p fall and found to have right clavicle fracture as well as right proximal femur fracture now s/p IM nail right femur 3/17. Found to have NSTEMI on 12/11/21. Cath Lab on 3/19 found to have single-vessel disease with heavily calcified more than 95% stenosis on her RCA, not able to be intervened upon. Cardiology following for potential further intervention vs medical management alone. PMH includes CVA, COPD, CHF, PVD, OA, osteoporosis. ?  ?OT comments ? Pt had daughter present in room and reporting they do not want the patient to complete any EOB activity due to RLE pain and wanting to have an xray completed (nursing made aware). Pt completed face washing with poor quality and trunk mobility for rolling side to side in bed and following one step commands. Pt was issued enlarged tubing to increase in ability to hold onto utensils and toothbrush. Pt currently with functional limitations due to the deficits listed below (see OT Problem List).  Pt will benefit from skilled OT to increase their safety and independence with ADL and functional mobility for ADL to facilitate discharge to venue listed below.  ?  ? ?Recommendations for follow up therapy are one component of a multi-disciplinary discharge planning process, led by the attending physician.  Recommendations may be updated based on patient status, additional functional criteria and insurance authorization. ?   ?Follow Up Recommendations ? Skilled nursing-short term rehab (<3 hours/day)  ?  ?Assistance Recommended at Discharge Frequent or constant Supervision/Assistance  ?Patient can return home with the following ? Two people to help with walking and/or transfers;Two people to help with bathing/dressing/bathroom ?  ?Equipment Recommendations ?    ?   ?Recommendations for Other Services   ? ?  ?Precautions / Restrictions Precautions ?Precautions: Fall;Other (comment) ?Precaution Comments: watch BP esp after narcotics ?Required Braces or Orthoses: Sling ?Restrictions ?Weight Bearing Restrictions: Yes ?RUE Weight Bearing: Weight bearing as tolerated ?RLE Weight Bearing: Non weight bearing  ? ? ?  ? ?Mobility Bed Mobility ?Overal bed mobility: Needs Assistance ?Bed Mobility: Rolling ?Rolling: Max assist ?  ?  ?  ?  ?  ?  ? ?Transfers ?  ?  ?  ?  ?  ?  ?  ?  ?  ?  ?  ?  ?Balance   ?  ?  ?  ?  ?  ?  ?  ?  ?  ?  ?  ?  ?  ?  ?  ?  ?  ?  ?   ? ?ADL either performed or assessed with clinical judgement  ? ?ADL Overall ADL's : Needs assistance/impaired ?Eating/Feeding: Maximal assistance;Cueing for safety;Cueing for sequencing ?  ?Grooming: Wash/dry hands;Wash/dry face;Moderate assistance;Sitting;Bed level ?  ?Upper Body Bathing: Maximal assistance;Cueing for safety;Cueing for sequencing;Bed level ?  ?Lower Body Bathing: Total assistance ?  ?Upper Body Dressing : Maximal assistance;Bed level ?  ?  ?  ?  ?  ?  ?  ?  ?  ?  ?General ADL Comments: Pt's daughter reporting they do not want patient to be moved to EOB as due to R ankle pain ?  ? ?Extremity/Trunk Assessment Upper Extremity Assessment ?Upper Extremity Assessment: LUE deficits/detail;RUE deficits/detail ?RUE Deficits / Details: clavicle fx. Sling for comfort. Assume no shoulder ROM. WFL for hand, wrist, and elbow. Educating daughter and  on resting positions. ?LUE Deficits / Details: Pt noted WNL of AROM but decrease in control of functional movmenets ?LUE Coordination: decreased fine motor;decreased gross motor ?  ?Lower Extremity Assessment ?Lower Extremity Assessment: Defer to PT evaluation ?  ?  ?  ? ?Vision   ?  ?  ?Perception   ?  ?Praxis   ?  ? ?Cognition Arousal/Alertness: Awake/alert ?Behavior During Therapy: Flat affect ?Overall Cognitive Status: Impaired/Different from baseline ?Area of Impairment: Problem  solving ?  ?  ?  ?  ?  ?  ?  ?  ?  ?  ?  ?  ?  ?  ?Problem Solving: Slow processing ?  ?  ?  ?   ?Exercises Exercises: General Upper Extremity ?General Exercises - Upper Extremity ?Shoulder Flexion: AROM, AAROM, Left, Strengthening, 20 reps, Supine ?Shoulder ABduction: AROM, AAROM, Strengthening, 20 reps, Supine ?Elbow Flexion: AROM, Right, 10 reps, Supine ?Wrist Flexion: AAROM, Both, 10 reps, Supine ?Wrist Extension: AAROM, Both, 10 reps, Supine ?Digit Composite Flexion: AAROM, Both, 10 reps, Supine ?Composite Extension: AAROM, Both, 10 reps, Supine ? ?  ?Shoulder Instructions   ? ? ?  ?General Comments    ? ? ?Pertinent Vitals/ Pain       Pain Assessment ?Pain Assessment: Faces ?Faces Pain Scale: Hurts whole lot ?Pain Location: R shoulder, hip and foot ?Pain Descriptors / Indicators: Discomfort, Grimacing ?Pain Intervention(s): Limited activity within patient's tolerance ? ?Home Living   ?  ?  ?  ?  ?  ?  ?  ?  ?  ?  ?  ?  ?  ?  ?  ?  ?  ?  ? ?  ?Prior Functioning/Environment    ?  ?  ?  ?   ? ?Frequency ? Min 2X/week  ? ? ? ? ?  ?Progress Toward Goals ? ?OT Goals(current goals can now be found in the care plan section) ? Progress towards OT goals: Progressing toward goals ? ?Acute Rehab OT Goals ?Patient Stated Goal: none reproted from patient ?OT Goal Formulation: With patient ?Time For Goal Achievement: 12/27/21 ?Potential to Achieve Goals: Good ?ADL Goals ?Pt Will Perform Grooming: with set-up;with supervision;sitting;bed level ?Pt Will Perform Upper Body Bathing: with mod assist;sitting ?Pt/caregiver will Perform Home Exercise Program: Right Upper extremity;Increased strength;With Supervision;With written HEP provided ?Additional ADL Goal #1: Pt will tolerate sitting upright chair position in bed for 15 minutes in preparation for ADLs ?Additional ADL Goal #2: Pt will tolerate sitting at EOB for 5 minutes with Mod A in preparation for ADLs  ?Plan Discharge plan remains appropriate   ? ?Co-evaluation ? ? ?   ?   ?  ?  ?  ? ?  ?AM-PAC OT "6 Clicks" Daily Activity     ?Outcome Measure ? ? Help from another person eating meals?: A Lot ?Help from another person taking care of personal grooming?: A Lot ?Help from another person toileting, which includes using toliet, bedpan, or urinal?: Total ?Help from another person bathing (including washing, rinsing, drying)?: Total ?Help from another person to put on and taking off regular upper body clothing?: A Lot ?Help from another person to put on and taking off regular lower body clothing?: Total ?6 Click Score: 9 ? ?  ?End of Session Equipment Utilized During Treatment: Oxygen ? ?OT Visit Diagnosis: Unsteadiness on feet (R26.81);Other abnormalities of gait and mobility (R26.89);Muscle weakness (generalized) (M62.81) ?  ?Activity Tolerance Patient limited by pain;Other (comment) ?  ?Patient Left in bed;with  call bell/phone within reach;with nursing/sitter in room;with family/visitor present;with bed alarm set ?  ?Nurse Communication Mobility status ?  ? ?   ? ?Time: 0930-1008 ?OT Time Calculation (min): 38 min ? ?Charges: OT General Charges ?$OT Visit: 1 Visit ?OT Treatments ?$Self Care/Home Management : 38-52 mins ? ?Alphia Moh OTR/L  ?Acute Rehab Services  ?[phone] office number ?[phone] pager number ? ? ?Alphia Moh ?12/16/2021, 12:08 PM ?

## 2021-12-16 NOTE — Progress Notes (Signed)
?PROGRESS NOTE ? ? ? ?MEHA GOON  [id number] DOB: 1946/03/19 DOA: 11/27/2021 ?PCP: Ria Bush, MD  ? ? ?Chief Complaint  ?Patient presents with  ? Fall  ? ? ?Brief Narrative:  ?76 year old lady with prior history of hypertension stage III CKD peripheral vascular disease comes in after a mechanical fall.  She was found to have right clavicle fracture as well as a right proximal femur fracture.  Orthopedics consulted and she underwent intramedullary fixation of the right femur,.  Hospital course complicated by postoperative NSTEMI as well as persistent acute blood loss anemia secondary to hematoma formation. ?Patient underwent urgent cath which showed heavily calcified RCA lesion was not intervened, recommended outpatient follow-up with cardiology on discharge. ? ?Assessment & Plan: ?  ?Principal Problem: ?  Closed intertrochanteric fracture of hip, right, initial encounter ([place]) ?Active Problems: ?  Right clavicle fracture ?  Hypertension ?  Peripheral vascular disease ([place]) ?  COPD (chronic obstructive pulmonary disease) ([place]) ?  MDD (major depressive disorder), recurrent episode, moderate ([place]) ?  Osteoporosis ?  Hypothyroidism (acquired) ?  Chronic diastolic heart failure ([place]) ?  Intertrigo ?  Stage 3a chronic kidney disease (CKD) ([place]) ?  Neuropathy ?  NSTEMI (non-ST elevated myocardial infarction) ([place]) ? ? ?NSTEMI ?Patient reported chest pain postsurgery. ?EKG showed depressed ST segments in the lateral leads. ?Troponins were elevated.  She underwent cardiac cath was found to have a severe single-vessel disease with heavily calcified more than 95% stenosis on her RCA.  This could not be intervened upon.  Cardiology recommended follow-up as an outpatient. She is not a candidate for any invasive treatment.  ?Currently not on aspirin, Plavix or heparin due to acute blood loss anemia. ?Patient  reports intermittent chest pressure, resolved.  ?EKG reviewed , just PVC'S. Currently on metoprolol 12.5  mg BID, and imdur 15 mg daily.  ? ? ?Closed intertrochanteric fracture of the right hip ?Orthopedics consulted and she was taken to the OR on 3/17 by Dr. Lyla Glassing.  She is s/p intramedullary fixation of the right femur. ?Weightbearing as tolerated. ?Therapy evaluations recommending SNF. ?Pain control with oral medications at this time. ? ? ?Acute anemia of blood loss s/p 3 units of PRBC transfusion. ?Repeat hemoglobin stable around 9.  CT of the abdomen and pelvis does not show retroperitoneal bleed.Heterogeneous lesion of the right groin, favored to be due to hematoma. However, given rounded appearance, recommend ultrasound ?for more definitive characterization since an enlarged lymph node could have a similar appearance. US of the right groin ordered for further evaluation.  ? ?Right lower extremity swelling-likely related to recent surgery/immobility, but ruled out DVT with a Doppler ultrasound. ? Hematoma appears to be stable. Hemoglobin stable around 9.1. ? ? ? ?Chronic diastolic heart failure ?She appears to be compensated. ?Continue with strict intake and output and daily weights. ? ?Hyperlipidemia ?Continue with statin and Zetia. ? ?History of COPD ?No wheezing on exam.  ?She is on 2 lit of [place] oxygen.  ?Will hold off CPAP tonight and watch her.  ?Check nocturnal oxygen levels.  ?Continue with dulera and [place] oxygen as needed.  ? ? ? ?Peripheral vascular disease ?Holding aspirin and plavix , continue with statin.  ? ? ? ?Essential hypertension ?Blood pressure parameters are borderline low.  ? ? ? ?Stage IIIa CKD ?Creatinine appears to be at baseline, continue to monitor. ? ? ? ?Right clavicular fracture ?Continue with the sling on the right arm ?Nonweightbearing recommended. ? ? ?Right foot pain , unable to  bear weight,  ?- x rays  are negative. Will get Uric acid.  ? ? ?Hypothyroidism:  ?Continue with synthroid.  ? ? ?DVT prophylaxis:  ?Code Status: DNR ?Family Communication: family at bedside.  ?Disposition:   ? ?Status is: Inpatient ?Remains inpatient appropriate because: right foot pain and further work up for gout.  ?  ?Consultants:  ?Cardiology ?Orthopedics.  ? ?Procedures: status post intramedullary fixation of the right femur ? ?Antimicrobials:  ?Antibiotics Given (last 72 hours)   ? ? None  ? ?  ? ? ? ? ?Subjective: ?No groin discomfort.  ?Right foot pain.  ?Right ankle pain.  ?Intermittent chest pain resolves spontaneously.  ? ?Objective: ?Vitals:  ? 12/15/21 2127 12/16/21 0015 12/16/21 0536 12/16/21 [id number]  ?BP:  96/65 107/60   ?Pulse: 86 87 74   ?Resp: 11 17 17    ?Temp:  97.9 ?F (36.6 ?C) 97.6 ?F (36.4 ?C) 98 ?F (36.7 ?C)  ?TempSrc:  Axillary Oral Oral  ?SpO2: 94% 97% 97%   ?Weight:      ?Height:      ? ? ?Intake/Output Summary (Last 24 hours) at 12/16/2021 1400 ?Last data filed at 12/16/2021 1130 ?Gross per 24 hour  ?Intake 720 ml  ?Output 2050 ml  ?Net -1330 ml  ? ? ?Filed Weights  ? 12/05/2021 2100  ?Weight: 85.3 kg  ? ? ?Examination: ? ?General exam: ill appearing lady on [place] oxygen , not in distress.  ?Respiratory system: Clear to auscultation. Respiratory effort normal. ?Cardiovascular system: S1 & S2 heard, RRR. No JVD, No pedal edema. ?Gastrointestinal system: Abdomen is nondistended, soft and nontender. Normal bowel sounds heard. ?Central nervous system: Alert and oriented. No focal neurological deficits. ?Extremities: Symmetric 5 x 5 power. ?Skin: No rashes, lesions or ulcers ?Psychiatry: Mood & affect appropriate.  ? ? ? ? ?Data Reviewed: I have personally reviewed following labs and imaging studies ? ?CBC: ?Recent Labs  ?Lab 12/16/2021 ?1830 12/03/2021 ?0308 12/11/21 ?[id number] 12/11/2021 ?[id number] 11/26/2021 ?2149 12/14/21 ?0136 12/15/21 ?0434 12/16/21 ?0304  ?WBC 10.9* 10.7*   < > 9.1 8.9 8.0 7.8 7.3  ?NEUTROABS 7.0 8.8*  --   --   --   --   --   --   ?HGB 12.4 9.8*   < > 6.3* 9.2* 8.7* 9.4* 9.1*  ?HCT 43.2 31.9*   < > 19.4* 27.7* 26.3* 29.0* 28.7*  ?MCV 105.6* 97.3   < > 94.2 87.1 88.0 89.8 90.5  ?PLT 186 172   < > 90*  98* 100* 111* 129*  ? < > = values in this interval not displayed.  ? ? ? ?Basic Metabolic Panel: ?Recent Labs  ?Lab 12/09/2021 ?0308 12/11/21 ?[id number] 12/02/2021 ?0202 12/18/2021 ?[id number] 12/14/21 ?0136 12/15/21 ?[id number]  ?NA 138 138 138 132* 137 137  ?K 4.8 4.6 4.7 4.7 4.3 4.8  ?CL 104 106 103 101 101 102  ?CO2 28 25 29 27 30 27   ?GLUCOSE 157* 187* 132* 133* 123* 119*  ?BUN 22 16 14 19 15 15   ?CREATININE 1.00 1.03* 0.78 0.84 0.71 0.63  ?CALCIUM 8.3* 7.7* 8.4* 7.7* 7.8* 8.4*  ?MG 1.9  --   --   --   --   --   ? ? ? ?GFR: ?Estimated Creatinine Clearance: 55 mL/min (by C-G formula based on SCr of 0.63 mg/dL). ? ?Liver Function Tests: ?Recent Labs  ?Lab 12/08/2021 ?0308 12/11/21 ?[id number] 12/03/2021 ?0242 12/14/21 ?0136 12/15/21 ?[id number]  ?AST 20 25 78* 62* 46*  ?ALT 15 14 11 13 15   ?  ALKPHOS 99 74 69 68 67  ?BILITOT 0.5 0.3 0.5 1.0 1.0  ?PROT 6.4* 5.4* 4.7* 5.2* 5.7*  ?ALBUMIN 3.4* 3.0* 2.5* 2.7* 2.7*  ? ? ? ?CBG: ?Recent Labs  ?Lab 11/30/2021 ?1552  ?GLUCAP 128*  ? ? ? ? ?Recent Results (from the past 240 hour(s))  ?MRSA Next Gen by PCR, Nasal     Status: None  ? Collection Time: 11/30/2021 12:29 AM  ? Specimen: Nasal Mucosa; Nasal Swab  ?Result Value Ref Range Status  ? MRSA by PCR Next Gen NOT DETECTED NOT DETECTED Final  ?  Comment: (NOTE) ?The GeneXpert MRSA Assay (FDA approved for NASAL specimens only), ?is one component of a comprehensive MRSA colonization surveillance ?program. It is not intended to diagnose MRSA infection nor to guide ?or monitor treatment for MRSA infections. ?Test performance is not FDA approved in patients less than 2 years ?old. ?Performed at [facility], [place] [address], [place], [place] ?[address] ?  ? ?  ? ? ? ? ? ?Radiology Studies: ?DG CHEST PORT 1 VIEW ? ?Result Date: 12/15/2021 ?CLINICAL DATA:  Increased oxygen demand. EXAM: PORTABLE CHEST 1 VIEW COMPARISON:  Prior chest radiograph 12/03/2021. FINDINGS: Cardiomegaly. Aortic atherosclerosis. Persistent mild ill-defined opacity within the left greater than  right lung bases. Possible trace left pleural effusion. No evidence of pneumothorax. Known right clavicular fracture and acute nondisplaced fracture of the posterolateral right fourth rib, better appr

## 2021-12-17 DIAGNOSIS — Z515 Encounter for palliative care: Secondary | ICD-10-CM | POA: Diagnosis not present

## 2021-12-17 DIAGNOSIS — F331 Major depressive disorder, recurrent, moderate: Secondary | ICD-10-CM | POA: Diagnosis not present

## 2021-12-17 DIAGNOSIS — S72141A Displaced intertrochanteric fracture of right femur, initial encounter for closed fracture: Secondary | ICD-10-CM | POA: Diagnosis not present

## 2021-12-17 DIAGNOSIS — I5032 Chronic diastolic (congestive) heart failure: Secondary | ICD-10-CM | POA: Diagnosis not present

## 2021-12-17 DIAGNOSIS — Z7189 Other specified counseling: Secondary | ICD-10-CM | POA: Diagnosis not present

## 2021-12-17 DIAGNOSIS — J439 Emphysema, unspecified: Secondary | ICD-10-CM | POA: Diagnosis not present

## 2021-12-17 LAB — BASIC METABOLIC PANEL
Anion gap: 7 (ref 5–15)
BUN: 26 mg/dL — ABNORMAL HIGH (ref 8–23)
CO2: 32 mmol/L (ref 22–32)
Calcium: 8.8 mg/dL — ABNORMAL LOW (ref 8.9–10.3)
Chloride: 97 mmol/L — ABNORMAL LOW (ref 98–111)
Creatinine, Ser: 0.82 mg/dL (ref 0.44–1.00)
GFR, Estimated: >60 mL/min (ref >60)
Glucose, Bld: 122 mg/dL — ABNORMAL HIGH (ref 70–99)
Potassium: 4.9 mmol/L (ref 3.5–5.1)
Sodium: 136 mmol/L (ref 135–145)

## 2021-12-17 LAB — CBC
HCT: 29.2 % — ABNORMAL LOW (ref 36.0–46.0)
Hemoglobin: 9.2 g/dL — ABNORMAL LOW (ref 12.0–15.0)
MCH: 29 pg (ref 26.0–34.0)
MCHC: 31.5 g/dL (ref 30.0–36.0)
MCV: 92.1 fL (ref 80.0–100.0)
Platelets: 161 10*3/uL (ref 150–400)
RBC: 3.17 MIL/uL — ABNORMAL LOW (ref 3.87–5.11)
RDW: 16.6 % — ABNORMAL HIGH (ref 11.5–15.5)
WBC: 7.3 10*3/uL (ref 4.0–10.5)
nRBC: 0 % (ref 0.0–0.2)

## 2021-12-17 MED ORDER — POLYETHYLENE GLYCOL 3350 17 G PO PACK
17.0000 g | PACK | Freq: Two times a day (BID) | ORAL | Status: DC
  Administered 2021-12-17 – 2021-12-18 (×2): 17 g via ORAL
  Filled 2021-12-17 (×2): qty 1

## 2021-12-17 NOTE — Progress Notes (Signed)
Physical Therapy Treatment ?Patient Details ?Name: [name] [name] ?MRN: [id number] ?DOB: 08/03/1946 ?Today's Date: 12/17/2021 ? ? ?History of Present Illness Patient is a 76 y/o female who presents on 11/27/2021 s/p fall and found to have right clavicle fracture as well as right proximal femur fracture now s/p IM nail right femur 3/17. Found to have NSTEMI on 12/11/21. Cath Lab on 3/19 found to have single-vessel disease with heavily calcified more than 95% stenosis on her RCA, not able to be intervened upon. Cardiology following for potential further intervention vs medical management alone. PMH includes CVA, COPD, CHF, PVD, OA, osteoporosis. ? ?  ?PT Comments  ? ? The pt was agreeable to session and able to progress to tolerating sitting EOB for ~10 min. She did progress from minG to minA needed to maintain static sitting due to poor core endurance, and is progressively less able to correct posterior LOB. The pt did complete a series of LE exercises in supine and sitting EOB, but requires minA to complete ROM with RLE at this time and shows no active eversion in R ankle. She was able to complete small movements with R toes, but unable to complete ankle circle or maintain neutral positioning against gravity. Will continue to benefit from skilled PT to progress functional activity tolerance, ROM in RLE, core endurance, and strength for OOB transfers. Continue to recommend SNF at this time.  ?   ?Recommendations for follow up therapy are one component of a multi-disciplinary discharge planning process, led by the attending physician.  Recommendations may be updated based on patient status, additional functional criteria and insurance authorization. ? ?Follow Up Recommendations ? Skilled nursing-short term rehab (<3 hours/day) ?  ?  ?Assistance Recommended at Discharge Frequent or constant Supervision/Assistance  ?Patient can return home with the following Two people to help with walking and/or transfers;Two people to  help with bathing/dressing/bathroom;Help with stairs or ramp for entrance;Assist for transportation;Assistance with cooking/housework ?  ?Equipment Recommendations ? None recommended by PT  ?  ?Recommendations for Other Services   ? ? ?  ?Precautions / Restrictions Precautions ?Precautions: Fall;Other (comment) ?Precaution Comments: watch BP esp after narcotics ?Required Braces or Orthoses: Sling ?Restrictions ?Weight Bearing Restrictions: Yes ?RUE Weight Bearing: Weight bearing as tolerated ?RLE Weight Bearing: Non weight bearing ?Other Position/Activity Restrictions: PRAFO for R ankle  ?  ? ?Mobility ? Bed Mobility ?Overal bed mobility: Needs Assistance ?Bed Mobility: Supine to Sit, Sit to Supine ?  ?  ?Supine to sit: Total assist, +2 for physical assistance, HOB elevated ?Sit to supine: Total assist, +2 for physical assistance ?  ?General bed mobility comments: minA to LLE totalA to RLE, totalA of 2 for trunk. pt assisting with RLE and trunk but unable to complete > 25% of movement. totalA of 2 to return to supine using helicopter transfer. BP stable through session ?  ? ?Transfers ?  ?  ?  ?  ?  ?  ?  ?  ?  ?General transfer comment: pt declined due to pain and fatigue. would require totalA of 2 or lift ?  ? ? ? ?  ?Balance Overall balance assessment: Needs assistance ?Sitting-balance support: Feet supported, Single extremity supported ?Sitting balance-Leahy Scale: Poor ?Sitting balance - Comments: able to maintain for short bouts without UE support, posterior lean with fatigue and needing verbal/tactile cues to correct ?Postural control: Posterior lean ?  ?  ?  ?  ?  ?  ?  ?  ?  ?  ?  ?  ?  ?  ?  ? ?  ?  Cognition Arousal/Alertness: Awake/alert ?Behavior During Therapy: Flat affect ?Overall Cognitive Status: Impaired/Different from baseline ?Area of Impairment: Problem solving ?  ?  ?  ?  ?  ?  ?  ?  ?  ?  ?  ?  ?  ?  ?Problem Solving: Slow processing ?General Comments: following commands, needing cues for  sequencing ?  ?  ? ?  ?Exercises General Exercises - Lower Extremity ?Ankle Circles/Pumps: AROM, Left, AAROM, Right, 10 reps, Supine, Limitations ?Ankle Circles/Pumps Limitations: no active eversion or DF noted in R ankle, unable to achieve neutral ?Quad Sets: AROM, Left, AAROM, Right, 10 reps, Supine ?Long Arc Quad: AROM, Left, AAROM, Right, 10 reps, Seated ?Heel Slides: AROM, Left, AAROM, Right, 10 reps, Supine ? ?  ?General Comments General comments (skin integrity, edema, etc.): VSS through session on 2L. pt cued for breathing as she holds breath for movement ?  ?  ? ?Pertinent Vitals/Pain Pain Assessment ?Pain Assessment: Faces ?Faces Pain Scale: Hurts even more ?Pain Location: R shoulder, R hip/thigh ?Pain Descriptors / Indicators: Discomfort, Grimacing ?Pain Intervention(s): Limited activity within patient's tolerance, Monitored during session, Repositioned  ? ? ? ?PT Goals (current goals can now be found in the care plan section) Acute Rehab PT Goals ?Patient Stated Goal: decrease pain ?PT Goal Formulation: With patient ?Time For Goal Achievement: 12/27/21 ?Potential to Achieve Goals: Fair ?Progress towards PT goals: Progressing toward goals (slowly) ? ?  ?Frequency ? ? ? Min 3X/week ? ? ? ?  ?PT Plan Current plan remains appropriate  ? ? ?Co-evaluation PT/OT/SLP Co-Evaluation/Treatment: Yes ?Reason for Co-Treatment: Complexity of the patient's impairments (multi-system involvement);For patient/therapist safety;To address functional/ADL transfers ?PT goals addressed during session: Mobility/safety with mobility;Balance;Strengthening/ROM ?  ?  ? ?  ?AM-PAC PT "6 Clicks" Mobility   ?Outcome Measure ? Help needed turning from your back to your side while in a flat bed without using bedrails?: A Lot ?Help needed moving from lying on your back to sitting on the side of a flat bed without using bedrails?: Total ?Help needed moving to and from a bed to a chair (including a wheelchair)?: Total ?Help needed standing  up from a chair using your arms (e.g., wheelchair or bedside chair)?: Total ?Help needed to walk in hospital room?: Total ?Help needed climbing 3-5 steps with a railing? : Total ?6 Click Score: 7 ? ?  ?End of Session Equipment Utilized During Treatment: Oxygen ?Activity Tolerance: Patient limited by pain;Patient limited by fatigue ?Patient left: in bed;with call bell/phone within reach;with bed alarm set;with family/visitor present ?Nurse Communication: Mobility status ?PT Visit Diagnosis: Pain;Muscle weakness (generalized) (M62.81) ?Pain - Right/Left: Right ?Pain - part of body: Leg ?  ? ? ?Time: 1540-0867 ?PT Time Calculation (min) (ACUTE ONLY): 33 min ? ?Charges:  $Therapeutic Exercise: 8-22 mins          ?          ? ?Vickki Muff, PT, DPT  ? ?Acute Rehabilitation Department ?Pager #: [phone] - [phone] ? ? ?Ronnie Derby ?12/17/2021, 1:41 PM ? ?

## 2021-12-17 NOTE — Progress Notes (Signed)
Occupational Therapy Treatment ?Patient Details ?Name: [name] [name] ?MRN: [id number] ?DOB: 13-Mar-1946 ?Today's Date: 12/17/2021 ? ? ?History of present illness Patient is a 76 y/o female who presents on 12/06/2021 s/p fall and found to have right clavicle fracture as well as right proximal femur fracture now s/p IM nail right femur 3/17. Found to have NSTEMI on 12/11/21. Cath Lab on 3/19 found to have single-vessel disease with heavily calcified more than 95% stenosis on her RCA, not able to be intervened upon. Cardiology following for potential further intervention vs medical management alone. PMH includes CVA, COPD, CHF, PVD, OA, osteoporosis. ?  ?OT comments ? Patient continues to make incremental progress towards goals in skilled OT session. Patient's session encompassed co-treat with PT to address deficits. Patient able to transition to EOB with max-total A of 2, with minimal initiation noted in RLE. Patient requiring decreased stimuli, increased time, and multimodal cues to follow simple commands but able to complete. Patient able to maintain sitting balance at close supervision level bit fatiguing after a few minutes. Discharge remains appropriate, OT will continue to follow.     ? ?Recommendations for follow up therapy are one component of a multi-disciplinary discharge planning process, led by the attending physician.  Recommendations may be updated based on patient status, additional functional criteria and insurance authorization. ?   ?Follow Up Recommendations ? Skilled nursing-short term rehab (<3 hours/day)  ?  ?Assistance Recommended at Discharge Frequent or constant Supervision/Assistance  ?Patient can return home with the following ? Two people to help with walking and/or transfers;Two people to help with bathing/dressing/bathroom ?  ?Equipment Recommendations ? Other (comment) (Defer to next venue)  ?  ?Recommendations for Other Services   ? ?  ?Precautions / Restrictions Precautions ?Precautions:  Fall;Other (comment) ?Precaution Comments: watch BP esp after narcotics ?Required Braces or Orthoses: Sling ?Restrictions ?Weight Bearing Restrictions: Yes ?RUE Weight Bearing: Weight bearing as tolerated ?RLE Weight Bearing: Non weight bearing ?Other Position/Activity Restrictions: PRAFO for R ankle  ? ? ?  ? ?Mobility Bed Mobility ?Overal bed mobility: Needs Assistance ?Bed Mobility: Supine to Sit, Sit to Supine ?Rolling: Max assist ?  ?Supine to sit: Total assist, +2 for physical assistance, HOB elevated ?Sit to supine: Total assist, +2 for physical assistance ?  ?General bed mobility comments: minA to LLE totalA to RLE, totalA of 2 for trunk. pt assisting with RLE and trunk but unable to complete > 25% of movement. totalA of 2 to return to supine using helicopter transfer. BP stable through session ?  ? ?Transfers ?  ?  ?  ?  ?  ?  ?  ?  ?  ?General transfer comment: pt declined due to pain and fatigue. would require totalA of 2 or lift ?  ?  ?Balance Overall balance assessment: Needs assistance ?Sitting-balance support: Feet supported, Single extremity supported ?Sitting balance-Leahy Scale: Poor ?Sitting balance - Comments: able to maintain for short bouts without UE support, posterior lean with fatigue and needing verbal/tactile cues to correct ?Postural control: Posterior lean ?  ?  ?  ?  ?  ?  ?  ?  ?  ?  ?  ?  ?  ?  ?   ? ?ADL either performed or assessed with clinical judgement  ? ?ADL Overall ADL's : Needs assistance/impaired ?  ?  ?Grooming: Wash/dry hands;Wash/dry face;Moderate assistance;Sitting ?Grooming Details (indicate cue type and reason): Sitting EOB ?  ?  ?  ?  ?  ?  ?Lower Body  Dressing: Total assistance ?  ?  ?  ?  ?  ?  ?  ?Functional mobility during ADLs: +2 for physical assistance;Total assistance;+2 for safety/equipment ?General ADL Comments: Patient progressing to EOB with PT/OT, unable to stand due to fatigue ?  ? ?Extremity/Trunk Assessment   ?  ?  ?  ?  ?  ? ?Vision   ?  ?   ?Perception   ?  ?Praxis   ?  ? ?Cognition Arousal/Alertness: Awake/alert ?Behavior During Therapy: Flat affect ?Overall Cognitive Status: Impaired/Different from baseline ?Area of Impairment: Problem solving ?  ?  ?  ?  ?  ?  ?  ?  ?  ?  ?  ?  ?  ?  ?Problem Solving: Slow processing ?General Comments: following commands, needing cues for sequencing ?  ?  ?   ?Exercises   ? ?  ?Shoulder Instructions   ? ? ?  ?General Comments VSS through session on 2L. Pt cues for breathing as she holds breath for movement  ? ? ?Pertinent Vitals/ Pain       Pain Assessment ?Pain Assessment: Faces ?Faces Pain Scale: Hurts even more ?Pain Location: R shoulder, R hip/thigh ?Pain Descriptors / Indicators: Discomfort, Grimacing ?Pain Intervention(s): Limited activity within patient's tolerance, Monitored during session, Repositioned ? ?Home Living   ?  ?  ?  ?  ?  ?  ?  ?  ?  ?  ?  ?  ?  ?  ?  ?  ?  ?  ? ?  ?Prior Functioning/Environment    ?  ?  ?  ?   ? ?Frequency ? Min 2X/week  ? ? ? ? ?  ?Progress Toward Goals ? ?OT Goals(current goals can now be found in the care plan section) ? Progress towards OT goals: Progressing toward goals ? ?Acute Rehab OT Goals ?Patient Stated Goal: to get better ?OT Goal Formulation: With patient ?Time For Goal Achievement: 12/27/21 ?Potential to Achieve Goals: Good  ?Plan Discharge plan remains appropriate   ? ?Co-evaluation ? ? ? PT/OT/SLP Co-Evaluation/Treatment: Yes ?Reason for Co-Treatment: Complexity of the patient's impairments (multi-system involvement);For patient/therapist safety;Necessary to address cognition/behavior during functional activity ?PT goals addressed during session: Mobility/safety with mobility;Balance;Strengthening/ROM ?OT goals addressed during session: ADL's and self-care ?  ? ?  ?AM-PAC OT "6 Clicks" Daily Activity     ?Outcome Measure ? ? Help from another person eating meals?: A Lot ?Help from another person taking care of personal grooming?: A Lot ?Help from another person  toileting, which includes using toliet, bedpan, or urinal?: Total ?Help from another person bathing (including washing, rinsing, drying)?: Total ?Help from another person to put on and taking off regular upper body clothing?: A Lot ?Help from another person to put on and taking off regular lower body clothing?: Total ?6 Click Score: 9 ? ?  ?End of Session   ? ?OT Visit Diagnosis: Unsteadiness on feet (R26.81);Other abnormalities of gait and mobility (R26.89);Muscle weakness (generalized) (M62.81) ?  ?Activity Tolerance Patient limited by pain ?  ?Patient Left in bed;with call bell/phone within reach;with nursing/sitter in room;with family/visitor present;with bed alarm set ?  ?Nurse Communication Mobility status ?  ? ?   ? ?Time: 4098-1191 ?OT Time Calculation (min): 33 min ? ?Charges: OT General Charges ?$OT Visit: 1 Visit ?OT Treatments ?$Self Care/Home Management : 8-22 mins ? ?Pollyann Glen E. Morine Kohlman, OTR/L ?Acute Rehabilitation Services ?[phone] ?[phone]  ? ?Pollyann Glen Labron Bloodgood ?12/17/2021, 3:27 PM ?

## 2021-12-17 NOTE — Progress Notes (Signed)
?PROGRESS NOTE ? ? ? ?NALANY LAUB  [id number] DOB: 06-Mar-1946 DOA: 12/20/2021 ?PCP: Ria Bush, MD  ? ? ?Chief Complaint  ?Patient presents with  ? Fall  ? ? ?Brief Narrative:  ?76 year old lady with prior history of hypertension stage III CKD peripheral vascular disease comes in after a mechanical fall.  She was found to have right clavicle fracture as well as a right proximal femur fracture.  Orthopedics consulted and she underwent intramedullary fixation of the right femur,.  Hospital course complicated by postoperative NSTEMI as well as persistent acute blood loss anemia secondary to hematoma formation. ?Patient underwent urgent cath which showed heavily calcified RCA lesion was not intervened, recommended outpatient follow-up with cardiology on discharge. ?Medical management at this time from cardiac standpoint. Unable to put her on aspirin or plavix due to right groin hematoma leading to blood loss anemia. Over the last 48 hours pt's hemoglobin has been stable, and she is working with PT. Recommend outpatient follow up with cardiology sooner to see if she can be started on plavix.  ?Pt is medically stable for discharge to SNF,whenever bed is available.  ? ?Assessment & Plan: ?  ?Principal Problem: ?  Closed intertrochanteric fracture of hip, right, initial encounter ([place]) ?Active Problems: ?  Right clavicle fracture ?  Hypertension ?  Peripheral vascular disease ([place]) ?  COPD (chronic obstructive pulmonary disease) ([place]) ?  MDD (major depressive disorder), recurrent episode, moderate ([place]) ?  Osteoporosis ?  Hypothyroidism (acquired) ?  Chronic diastolic heart failure ([place]) ?  Intertrigo ?  Stage 3a chronic kidney disease (CKD) ([place]) ?  Neuropathy ?  NSTEMI (non-ST elevated myocardial infarction) ([place]) ? ? ?NSTEMI ?Patient reported chest pain postsurgery. ?EKG showed depressed ST segments in the lateral leads. ?Troponins were elevated.  She underwent cardiac cath was found to have a severe  single-vessel disease with heavily calcified more than 95% stenosis on her RCA.  This could not be intervened upon.  Cardiology recommended follow-up as an outpatient. She is not a candidate for any invasive treatment.  ?Currently not on aspirin, Plavix or heparin due to acute blood loss anemia. ?EKG reviewed , just PVC'S. Currently on metoprolol 12.5 mg BID, and imdur 15 mg daily.  ?Currently chest pain free all day.  ? ? ?Closed intertrochanteric fracture of the right hip ?Orthopedics consulted and she was taken to the OR on 3/17 by Dr. Lyla Glassing.  She is s/p intramedullary fixation of the right femur. ?Weightbearing as tolerated. ?Therapy evaluations recommending SNF. ?Pain control with oral medications at this time. ?Recommend outpatient follow up with orthopedics.  ? ? ?Acute anemia of blood loss s/p 3 units of PRBC transfusion. ?Repeat hemoglobin stable around 9.  CT of the abdomen and pelvis does not show retroperitoneal bleed.Heterogeneous lesion of the right groin, favored to be due to hematoma. However, given rounded appearance, recommend ultrasound ?for more definitive characterization since an enlarged lymph node could have a similar appearance. US of the right groin ordered for further evaluation.  ? ?Right lower extremity swelling-likely related to recent surgery/immobility, but ruled out DVT with a Doppler ultrasound. ? Hematoma appears to be stable. Hemoglobin stable around 9.1. and 9.2.  ? ? ? ?Chronic diastolic heart failure ?She appears to be compensated. ?Continue with strict intake and output and daily weights. ? ?Hyperlipidemia ?Continue with statin and Zetia. ? ?History of COPD ?No wheezing on exam.  ?She is on 2 lit of [place] oxygen.  ?Will hold off CPAP tonight and watch her.  ?  Check nocturnal oxygen levels.  ?Continue with dulera and [place] oxygen as needed.  ? ? ? ?Peripheral vascular disease ?Holding aspirin and plavix , continue with statin.  ? ? ? ?Essential hypertension ?Blood pressure  parameters are optimal.  ? ? ? ?Stage IIIa CKD ?Creatinine appears to be at baseline, continue to monitor. ? ? ? ?Right clavicular fracture ?Continue with the sling on the right arm ?Nonweightbearing recommended. ?Recommend outpatient follow up with orthopedics.  ? ? ?Right foot pain , unable to bear weight,  ?- x rays  are negative. Much improved today.  ?Uric acid levels wnl.  ? ? ?Hypothyroidism:  ?Continue with synthroid.  ? ?Thrombocytopenia:  ?Resolved.  ? ? ?DVT prophylaxis: scd's ?Code Status: DNR ?Family Communication: family at bedside.  ?Disposition:  ? ?Status is: Inpatient ?Remains inpatient appropriate because: right foot pain and further work up for gout.  ?  ?Consultants:  ?Cardiology ?Orthopedics.  ? ?Procedures: status post intramedullary fixation of the right femur ? ?Antimicrobials:  ?Antibiotics Given (last 72 hours)   ? ? None  ? ?  ? ? ? ? ?Subjective: ?Right foot pain is better. No bowel movement today.  ?No chest pain today.  ? ?Objective: ?Vitals:  ? 12/16/21 2029 12/16/21 2156 12/17/21 0531 12/17/21 0800  ?BP: (!) 94/56  (!) 130/57 (!) 126/59  ?Pulse: 81   72  ?Resp: 15  16 14   ?Temp:  98.8 ?F (37.1 ?C)  98.3 ?F (36.8 ?C)  ?TempSrc:  Axillary  Oral  ?SpO2: 94%  95% 95%  ?Weight:      ?Height:      ? ? ?Intake/Output Summary (Last 24 hours) at 12/17/2021 1620 ?Last data filed at 12/17/2021 0604 ?Gross per 24 hour  ?Intake --  ?Output 1300 ml  ?Net -1300 ml  ? ? ?Filed Weights  ? 12/12/2021 2100  ?Weight: 85.3 kg  ? ? ?Examination: ? ?General exam: ill appearing lady on 2 lit of [place] xygen.  ?Respiratory system: diminished air entry at bases. No wheezing heard.  ?Cardiovascular system: S1 & S2 heard, RRR. No JVD,No pedal edema. ?Gastrointestinal system: Abdomen is nondistended, soft and nontender.. Normal bowel sounds heard. ?Central nervous system: Alert and oriented. No focal neurological deficits. ?Extremities: Symmetric 5 x 5 power. ?Skin: No rashes, lesions or ulcers ?Psychiatry: Mood &  affect appropriate.  ? ? ? ? ?Data Reviewed: I have personally reviewed following labs and imaging studies ? ?CBC: ?Recent Labs  ?Lab 12/15/2021 ?2149 12/14/21 ?0136 12/15/21 ?[id number] 12/16/21 ?0304 12/17/21 ?0409  ?WBC 8.9 8.0 7.8 7.3 7.3  ?HGB 9.2* 8.7* 9.4* 9.1* 9.2*  ?HCT 27.7* 26.3* 29.0* 28.7* 29.2*  ?MCV 87.1 88.0 89.8 90.5 92.1  ?PLT 98* 100* 111* 129* 161  ? ? ? ?Basic Metabolic Panel: ?Recent Labs  ?Lab 12/17/2021 ?0202 11/27/2021 ?[id number] 12/14/21 ?0136 12/15/21 ?[id number] 12/17/21 ?0409  ?NA 138 132* 137 137 136  ?K 4.7 4.7 4.3 4.8 4.9  ?CL 103 101 101 102 97*  ?CO2 29 27 30 27  32  ?GLUCOSE 132* 133* 123* 119* 122*  ?BUN 14 19 15 15  26*  ?CREATININE 0.78 0.84 0.71 0.63 0.82  ?CALCIUM 8.4* 7.7* 7.8* 8.4* 8.8*  ? ? ? ?GFR: ?Estimated Creatinine Clearance: 53.6 mL/min (by C-G formula based on SCr of 0.82 mg/dL). ? ?Liver Function Tests: ?Recent Labs  ?Lab 12/11/21 ?0336 12/13/21 ?0242 12/14/21 ?0136 12/15/21 ?[id number]  ?AST 25 78* 62* 46*  ?ALT 14 11 13 15   ?ALKPHOS 74 69 68 67  ?BILITOT 0.3  0.5 1.0 1.0  ?PROT 5.4* 4.7* 5.2* 5.7*  ?ALBUMIN 3.0* 2.5* 2.7* 2.7*  ? ? ? ?CBG: ?Recent Labs  ?Lab 12/11/2021 ?1552  ?GLUCAP 128*  ? ? ? ? ?Recent Results (from the past 240 hour(s))  ?MRSA Next Gen by PCR, Nasal     Status: None  ? Collection Time: 12/22/2021 12:29 AM  ? Specimen: Nasal Mucosa; Nasal Swab  ?Result Value Ref Range Status  ? MRSA by PCR Next Gen NOT DETECTED NOT DETECTED Final  ?  Comment: (NOTE) ?The GeneXpert MRSA Assay (FDA approved for NASAL specimens only), ?is one component of a comprehensive MRSA colonization surveillance ?program. It is not intended to diagnose MRSA infection nor to guide ?or monitor treatment for MRSA infections. ?Test performance is not FDA approved in patients less than 2 years ?old. ?Performed at [facility], [place] [address] [place], [place] ?[address] ?  ? ?  ? ? ? ? ? ?Radiology Studies: ?DG Foot Complete Right ? ?Result Date: 12/16/2021 ?CLINICAL DATA:  Trauma, pain EXAM: RIGHT FOOT  COMPLETE - 3+ VIEW COMPARISON:  None. FINDINGS: No displaced fracture or dislocation is seen. Osteopenia is seen in bony structures. IMPRESSION: No displaced fracture or dislocation is seen. Electronically Signed   By: Mila Merry

## 2021-12-17 NOTE — TOC Progression Note (Signed)
Transition of Care (TOC) - Progression Note  ? ? ?Patient Details  ?Name: [name] [name] ?MRN: [id number] ?Date of Birth: 09-01-1946 ? ?Transition of Care (TOC) CM/SW Contact  ?Lorri Frederick, LCSW ?Phone Number: ?12/17/2021, 4:09 PM ? ?Clinical Narrative:   CSW informed that daughter is requesting referral be sent to [facility] which was done.  CSW spoke with Selena Batten who will review. ? ?CSW spoke with pt daughter [name] who reports she has not made a bed choice yet, states she has visited [facility] and is planning to visit [facility].  Reports MD told her pt would not DC until Tuesday or Wednesday of next week.   ? ?TOC will continue to follow.   ? ? ? ?Expected Discharge Plan: Skilled Nursing Facility ?Barriers to Discharge: Continued Medical Work up ? ?Expected Discharge Plan and Services ?Expected Discharge Plan: Skilled Nursing Facility ?In-house Referral: Clinical Social Work ?  ?  ?Living arrangements for the past 2 months: Single Family Home ?                ?  ?  ?  ?  ?  ?  ?  ?  ?  ?  ? ? ?Social Determinants of Health (SDOH) Interventions ?  ? ?Readmission Risk Interventions ? ?  12/26/2019  ?  1:04 PM  ?Readmission Risk Prevention Plan  ?Transportation Screening Complete  ?PCP or Specialist Appt within 5-7 Days Complete  ?Home Care Screening Complete  ?Medication Review (RN CM) Complete  ? ? ?

## 2021-12-17 NOTE — Progress Notes (Signed)
Nutrition Follow-up ? ?DOCUMENTATION CODES:  ? ?Not applicable ? ?INTERVENTION:  ?-continue Ensure Enlive po BID, each supplement provides 350 kcal and 20 grams of protein. ?-continue MVI with minerals daily ? ?NUTRITION DIAGNOSIS:  ? ?Increased nutrient needs related to hip fracture, chronic illness (COPD, CHF) as evidenced by estimated needs. ? ?ongoing ? ?GOAL:  ? ?Patient will meet greater than or equal to 90% of their needs ? ?progressing ? ?MONITOR:  ? ?PO intake, Diet advancement, Labs, Weight trends ? ?REASON FOR ASSESSMENT:  ? ?Consult ?Hip fracture protocol ? ?ASSESSMENT:  ? ?76 year old female who presented to the ED on 3/16 after a mechanical fall. PMH of HTN, HLD, PVD, CKD stage III, CHF, COPD, anxiety, depression. Pt admitted with closed intertrochanteric fracture of the R hip and R clavicle fracture. ? ?3/17 s/p IM fixation R femur ? ?Pt unavailable at time of RD visit. Per MD, Pt is medically stable for discharge to SNF,whenever bed is available.  ?Pt doing well with ONS per RN. ?PO Intake: 100% x 4 recorded meals  ? ?UOP: 2143ml x24 hours ?I/O: -4757ml since admit ? ?Medications: ? docusate sodium  100 mg Oral BID  ? feeding supplement  237 mL Oral BID BM  ? multivitamin with minerals  1 tablet Oral Daily  ? polyethylene glycol  17 g Oral Daily  ? senna-docusate  2 tablet Oral BID  ? ?Labs: ?Recent Labs  ?Lab 12/14/21 ?0136 12/15/21 ?[id number] 12/17/21 ?0409  ?NA 137 137 136  ?K 4.3 4.8 4.9  ?CL 101 102 97*  ?CO2 30 27 32  ?BUN 15 15 26*  ?CREATININE 0.71 0.63 0.82  ?CALCIUM 7.8* 8.4* 8.8*  ?GLUCOSE 123* 119* 122*  ? ?Diet Order:   ?Diet Order   ? ?       ?  Diet regular Room service appropriate? Yes with Assist; Fluid consistency: Thin  Diet effective now       ?  ? ?  ?  ? ?  ? ? ?EDUCATION NEEDS:  ? ?No education needs have been identified at this time ? ?Skin:  Skin Assessment: Skin Integrity Issues: ?Skin Integrity Issues:: Incisions ?Incisions: R hip ? ?Last BM:  3/22 ? ?Height:  ? ?Ht Readings  from Last 1 Encounters:  ?12/12/2021 4\' 9"  (1.448 m)  ? ? ?Weight:  ? ?Wt Readings from Last 1 Encounters:  ?12/07/2021 85.3 kg  ? ? ?BMI:  Body mass index is 40.69 kg/m?. ? ?Estimated Nutritional Needs:  ? ?Kcal:  1700-1900 ? ?Protein:  90-105 grams ? ?Fluid:  1.7-1.9 L ? ? ? ? ?Theone Stanley., MS, RD, LDN (she/her/hers) ?RD pager number and weekend/on-call pager number located in [place]. ? ?

## 2021-12-17 NOTE — Progress Notes (Signed)
? ?Palliative Medicine Inpatient Follow Up Note ? ? ?HPI: ?76 year old female with a past medical history of hypertension, PVD, CKD stage IIIa, COPD, chronic diastolic CHF.  She was admitted to the hospital on 12/12/2021 after a fall at home.  She was found to have right clavicle fracture as well as right proximal femur fracture.  She was taken to the OR on 3/17 for intramedullary fixation of the right femur.  Patient experienced chest pain on 3/18 and found to have elevated troponin concerning for NSTEMI.  She was started on heparin drip.  She underwent left heart cath today which revealed single-vessel obstructive CAD (critical 95% proximal stenosis in the RCA in an angulated segment.  This is followed by a 90% stenosis in the mid vessel.  The vessel is severely calcified.).  Cardiology planning on repeat cardiac catheterization tomorrow to consider PCI of the RCA (high risk intervention given her calcified vessel).  ?  ?Palliative care has been asked to get involved to further address goals of care. ? ?Today's Discussion (12/17/2021): ? ?*Please note that this is a verbal dictation therefore any spelling or grammatical errors are due to the "[place] One" system interpretation. ? ?Chart reviewed inclusive of vital signs, progress notes, laboratory results, and diagnostic images.  ? ?I met with Kaytlan and her daughter, Precious Bard this morning. [name] was feeding Rayvn breakfast. She appeared to eat about half of it. We reviewed that Andrea's pain control has been better. I shared the importance of trying oral medication prior to IVP formulations as these cannot be provided at skilled nursing facilities.  ? ?K[place] shares that her pain is under better control overall. ? ?Upon assessment her right thigh swelling has decreased significantly. We reviewed that this can greatly contribute to neuropathic pain which over time should improve as she blood reabsorbs. Sheral expresses that her R foot pain has decreased.  Reviewed per imaging no new fracture identified. ? ?Patient shares she feels relief in her abdomen as she had a well sized bowel movement yesterday.  ? ?We talked about rehabilitation from hospitalization. Patient understands this will be a tough road but she shares willingness to put in the work to improve her situation.  ? ?OP Palliative support on discharge.  ? ?Questions and concerns addressed  ? ?Palliative Support Provided ? ?Objective Assessment: ?Vital Signs ?Vitals:  ? 12/16/21 2156 12/17/21 0531  ?BP:  (!) 130/57  ?Pulse:    ?Resp:  16  ?Temp: 98.8 ?F (37.1 ?C)   ?SpO2:  95%  ? ? ?Intake/Output Summary (Last 24 hours) at 12/17/2021 0931 ?Last data filed at 12/17/2021 0604 ?Gross per 24 hour  ?Intake --  ?Output 2150 ml  ?Net -2150 ml  ? ? ?Last Weight  Most recent update: 12/12/2021 10:03 PM  ? ? Weight  ?85.3 kg (188 lb 0.8 oz)  ?      ? ?  ? ?Gen:  Frail elderly F in NAD ?HEENT: moist mucous membranes ?CV: Regular rate and rhythm ?PULM: On 2LPM [place] ?ABD: soft/nontender ?EXT: R thigh warm, less taught, non tender ?Neuro: Alert and oriented x3 ? ?SUMMARY OF RECOMMENDATIONS   ?DNAR/DNI ?  ?Differences between comfort care and aggressive medical treatment reviewed --> Patient and family interested in treating what is treatable without aggressive invasive interventions. Would still like medical management - not ready for hospice.  ? ?PT/OT for --> Recommended SNF  ? ?TOC involved trying to coordinate SNF transfer --> Family will look at facilities today ? ?Plan for  OP Palliative support on discharge ?  ?Palliative Care will continue to follow along ? ?Symptoms: ?R Hip Pain: ?- Dilaudid $RemoveBef'2mg'CsrGhSiSaD$  PO Q3H ?- Robaxin and Tylenol Q6H ATC ?- Fentanyl 12.5-25mcg IV PRN Q2H for breakthrough pain ? ? Generalized Weakness: ?- PT/OT ?- SNF recommended ? ?Constipation: ?- Colave $RemoveB'100mg'FNEDfdVH$  PO BID ?- Senna 2 Tabs PO BID ?- Bisacodyl $RemoveBefo'10mg'OwSOrDTUSRW$  PR Qday PRN ? ?Insomnia: ?- Melatonin $RemoveBefo'10mg'ZgbltAiEpHR$  PO QHS PRN ?  ?MDM -  High ?______________________________________________________________________________________ ?Tacey Ruiz ?[facility] Team ?Team Cell Phone: [phone] ?Please utilize secure chat with additional questions, if there is no response within 30 minutes please call the above phone number ? ?Palliative Medicine Team providers are available by phone from 7am to 7pm daily and can be reached through the team cell phone.  ?Should this patient require assistance outside of these hours, please call the patient's attending physician. ? ? ? ? ?

## 2021-12-17 NOTE — Consult Note (Signed)
? ?  [facility] CM Inpatient Consult ? ? ?12/17/2021 ? ?Ariel S Alverson ?Sep 25, 1946 ?[id number] ? ?[facility] Customer service manager [THN]  Occupational hygienist [ACO] Patient: [employer]  ? ?Primary Care Provider:  [name], MD, [place] [facility] is an embedded provider with a Chronic Care Management team and program, and is listed for the transition of care follow up and appointments. ? ?Patient was screened for Embedded practice service needs for chronic care management and patient has been active with Embedded Pharmacist for CCM. Patient is currently being recommended for a skilled nursing facility level of care for post hospital.  ? ?Plan: Continue to follow for post hospital needs. ? ?Please contact for further questions, ? ?Charlesetta Shanks, RN BSN CCM ?[employer] [employer] Liaison ? [phone] business mobile phone ?Toll free office [phone]  ?Fax number: [phone] ?[email or web address] ?[email or web address] ? ? ? ? ? ?

## 2021-12-17 NOTE — Progress Notes (Signed)
Patient complained of sudden sharp mid sternal chest pain radiating to left arm. When arriving to room, patient said the pain was subsiding, EKG completed and showed NSR with some PVCs. Dr. Karleen Hampshire notified. Vitals stable. Patient currently resting comfortably and denies chest pain. Will continue to monitor. ?

## 2021-12-18 DIAGNOSIS — S72141A Displaced intertrochanteric fracture of right femur, initial encounter for closed fracture: Secondary | ICD-10-CM | POA: Diagnosis not present

## 2021-12-18 DIAGNOSIS — Z7189 Other specified counseling: Secondary | ICD-10-CM | POA: Diagnosis not present

## 2021-12-18 DIAGNOSIS — Z66 Do not resuscitate: Secondary | ICD-10-CM | POA: Diagnosis not present

## 2021-12-18 DIAGNOSIS — Z515 Encounter for palliative care: Secondary | ICD-10-CM | POA: Diagnosis not present

## 2021-12-18 DIAGNOSIS — J439 Emphysema, unspecified: Secondary | ICD-10-CM | POA: Diagnosis not present

## 2021-12-18 DIAGNOSIS — I10 Essential (primary) hypertension: Secondary | ICD-10-CM | POA: Diagnosis not present

## 2021-12-18 MED ORDER — POLYETHYLENE GLYCOL 3350 17 G PO PACK
17.0000 g | PACK | Freq: Every day | ORAL | Status: DC
  Filled 2021-12-18 (×2): qty 1

## 2021-12-18 NOTE — Progress Notes (Signed)
?PROGRESS NOTE ? ? ? ?[name] [name]  [id number] DOB: 11-22-1945 DOA: 11/27/2021 ?PCP: Ria Bush, MD  ? ? ?Chief Complaint  ?Patient presents with  ? Fall  ? ? ?Brief Narrative:  ?76 year old lady with prior history of hypertension stage III CKD peripheral vascular disease comes in after a mechanical fall.  She was found to have right clavicle fracture as well as a right proximal femur fracture.  Orthopedics consulted and she underwent intramedullary fixation of the right femur,.  Hospital course complicated by postoperative NSTEMI as well as persistent acute blood loss anemia secondary to hematoma formation. ?Patient underwent urgent cath which showed heavily calcified RCA lesion was not intervened, recommended outpatient follow-up with cardiology on discharge. ?Medical management at this time from cardiac standpoint. Unable to put her on aspirin or plavix due to right groin hematoma leading to blood loss anemia. Over the last 48 hours pt's hemoglobin has been stable, and she is working with PT. Recommend outpatient follow up with cardiology sooner to see if she can be started on plavix.  ?Pt is medically stable for discharge to SNF,whenever bed is available.  ? ?Assessment & Plan: ?  ?Principal Problem: ?  Closed intertrochanteric fracture of hip, right, initial encounter ([place]) ?Active Problems: ?  Right clavicle fracture ?  Hypertension ?  Peripheral vascular disease ([place]) ?  COPD (chronic obstructive pulmonary disease) ([place]) ?  MDD (major depressive disorder), recurrent episode, moderate ([place]) ?  Osteoporosis ?  Hypothyroidism (acquired) ?  Chronic diastolic heart failure ([place]) ?  Intertrigo ?  Stage 3a chronic kidney disease (CKD) ([place]) ?  Neuropathy ?  NSTEMI (non-ST elevated myocardial infarction) ([place]) ? ? ?NSTEMI ?Patient reported chest pain postsurgery. ?EKG showed depressed ST segments in the lateral leads. ?Troponins were elevated.  She underwent cardiac cath was found to have a severe  single-vessel disease with heavily calcified more than 95% stenosis on her RCA.  This could not be intervened upon.  Cardiology recommended follow-up as an outpatient. She is not a candidate for any invasive treatment.  ?Currently not on aspirin, Plavix or heparin due to acute blood loss anemia. ?EKG reviewed , just PVC'S. Currently on metoprolol 12.5 mg BID, and imdur 15 mg daily.  ?No chest pain today.  ? ? ?Closed intertrochanteric fracture of the right hip ?Orthopedics consulted and she was taken to the OR on 3/17 by Dr. Lyla Glassing.  She is s/p intramedullary fixation of the right femur. ?Weightbearing as tolerated. ?Therapy evaluations recommending SNF. ?Pain control with oral medications at this time. ?Recommend outpatient follow up with orthopedics.  ?Daughter concerned that pt cannot get out of bed to get to the bathroom and use the bed side commode at the facility if she is discharged sooner. She wants the patient to be able to do those things before she can be discharged to SNF.  ? ? ?Acute anemia of blood loss s/p 3 units of PRBC transfusion. ?Repeat hemoglobin stable around 9.  CT of the abdomen and pelvis does not show retroperitoneal bleed.Heterogeneous lesion of the right groin, favored to be due to hematoma. However, given rounded appearance, recommend ultrasound ?for more definitive characterization since an enlarged lymph node could have a similar appearance. US of the right groin ordered for further evaluation.  ? ?Right lower extremity swelling-likely related to recent surgery/immobility, but ruled out DVT with a Doppler ultrasound. ? Hematoma appears to be stable. Hemoglobin stable around 9.1. and 9.2.  ?No new labs today.  ? ? ? ?Chronic diastolic  heart failure ?She appears to be compensated. ?Continue with strict intake and output and daily weights. ? ?Hyperlipidemia ?Continue with statin and Zetia. ? ?History of COPD ?No wheezing on exam.  ?She is on 2 lit of [place] oxygen.  ?Will hold off CPAP  tonight and watch her.  ?Check nocturnal oxygen levels.  ?Continue with dulera and [place] oxygen as needed.  ? ? ? ?Peripheral vascular disease ?Holding aspirin and plavix , continue with statin.  ? ? ? ?Essential hypertension ?Blood pressure parameters are well controlled.  ? ? ? ?Stage IIIa CKD ?Creatinine appears to be at baseline, continue to monitor. ? ? ? ?Right clavicular fracture ?Continue with the sling on the right arm ?Nonweightbearing recommended. ?Recommend outpatient follow up with orthopedics.  ? ? ?Right foot pain , unable to bear weight,  ?- x rays  are negative. Much improved today.  ?Uric acid levels wnl.  ? ? ?Hypothyroidism:  ?Continue with synthroid.  ? ?Thrombocytopenia:  ?Resolved.  ? ?Constipation:  ?Resolved.  ?Currently on senna colace and miralax.  ? ? ?DVT prophylaxis: scd's ?Code Status: DNR ?Family Communication: family at bedside.  ?Disposition:  ? ?Status is: Inpatient ?Remains inpatient appropriate because: right foot pain and further work up for gout.  ?  ?Consultants:  ?Cardiology ?Orthopedics.  ? ?Procedures: status post intramedullary fixation of the right femur ? ?Antimicrobials:  ?Antibiotics Given (last 72 hours)   ? ? None  ? ?  ? ? ? ? ?Subjective: ?Pain is better, had a bowel movement. No nausea, vomiting.  ? ? ?Objective: ?Vitals:  ? 12/17/21 2014 12/18/21 0411 12/18/21 0836 12/18/21 1551  ?BP: (!) 104/56 (!) 120/56  (!) 99/51  ?Pulse: 82 75 78 92  ?Resp: 20 15 14 20   ?Temp: 98.2 ?F (36.8 ?C)   98.1 ?F (36.7 ?C)  ?TempSrc: Oral   Oral  ?SpO2: 97% 96% 97% 93%  ?Weight:      ?Height:      ? ? ?Intake/Output Summary (Last 24 hours) at 12/18/2021 1656 ?Last data filed at 12/18/2021 0701 ?Gross per 24 hour  ?Intake --  ?Output 1200 ml  ?Net -1200 ml  ? ? ?Filed Weights  ? 12/19/2021 2100  ?Weight: 85.3 kg  ? ? ?Examination: ? ?General exam: ill appearing lady not in distress.  ?Respiratory system: Clear to auscultation. Respiratory effort normal. ?Cardiovascular system: S1 & S2 heard,  RRR. No JVD, No pedal edema. ?Gastrointestinal system: Abdomen is nondistended, soft and nontender.  Normal bowel sounds heard. ?Central nervous system: Alert and oriented. No focal neurological deficits. ?Extremities: no new complaints.  ?Skin: No rashes, lesions or ulcers ?Psychiatry:  Mood & affect appropriate.  ? ? ? ? ? ?Data Reviewed: I have personally reviewed following labs and imaging studies ? ?CBC: ?Recent Labs  ?Lab 12/06/2021 ?2149 12/14/21 ?0136 12/15/21 ?[id number] 12/16/21 ?0304 12/17/21 ?0409  ?WBC 8.9 8.0 7.8 7.3 7.3  ?HGB 9.2* 8.7* 9.4* 9.1* 9.2*  ?HCT 27.7* 26.3* 29.0* 28.7* 29.2*  ?MCV 87.1 88.0 89.8 90.5 92.1  ?PLT 98* 100* 111* 129* 161  ? ? ? ?Basic Metabolic Panel: ?Recent Labs  ?Lab 12/01/2021 ?0202 12/02/2021 ?[id number] 12/14/21 ?0136 12/15/21 ?[id number] 12/17/21 ?0409  ?NA 138 132* 137 137 136  ?K 4.7 4.7 4.3 4.8 4.9  ?CL 103 101 101 102 97*  ?CO2 29 27 30 27  32  ?GLUCOSE 132* 133* 123* 119* 122*  ?BUN 14 19 15 15  26*  ?CREATININE 0.78 0.84 0.71 0.63 0.82  ?CALCIUM 8.4* 7.7* 7.8* 8.4*  8.8*  ? ? ? ?GFR: ?Estimated Creatinine Clearance: 53.6 mL/min (by C-G formula based on SCr of 0.82 mg/dL). ? ?Liver Function Tests: ?Recent Labs  ?Lab 12/16/2021 ?0242 12/14/21 ?0136 12/15/21 ?[id number]  ?AST 78* 62* 46*  ?ALT 11 13 15   ?ALKPHOS 69 68 67  ?BILITOT 0.5 1.0 1.0  ?PROT 4.7* 5.2* 5.7*  ?ALBUMIN 2.5* 2.7* 2.7*  ? ? ? ?CBG: ?Recent Labs  ?Lab 12/04/2021 ?1552  ?GLUCAP 128*  ? ? ? ? ?Recent Results (from the past 240 hour(s))  ?MRSA Next Gen by PCR, Nasal     Status: None  ? Collection Time: 12/05/2021 12:29 AM  ? Specimen: Nasal Mucosa; Nasal Swab  ?Result Value Ref Range Status  ? MRSA by PCR Next Gen NOT DETECTED NOT DETECTED Final  ?  Comment: (NOTE) ?The GeneXpert MRSA Assay (FDA approved for NASAL specimens only), ?is one component of a comprehensive MRSA colonization surveillance ?program. It is not intended to diagnose MRSA infection nor to guide ?or monitor treatment for MRSA infections. ?Test performance is not FDA  approved in patients less than 2 years ?old. ?Performed at [facility], [place] [address] [place], [place] ?[address] ?  ? ?  ? ? ? ? ? ?Radiology Studies: ?No results found. ? ? ? ? ? ?Scheduled Meds: ? s

## 2021-12-18 NOTE — Plan of Care (Signed)

## 2021-12-18 NOTE — Progress Notes (Signed)
Refused cpap.

## 2021-12-18 NOTE — Progress Notes (Addendum)
? ?Palliative Medicine Inpatient Follow Up Note ? ?HPI: ?76 year old female with a past medical history of hypertension, PVD, CKD stage IIIa, COPD, chronic diastolic CHF.  She was admitted to the hospital on 12/05/2021 after a fall at home.  She was found to have right clavicle fracture as well as right proximal femur fracture.  She was taken to the OR on 3/17 for intramedullary fixation of the right femur.  Patient experienced chest pain on 3/18 and found to have elevated troponin concerning for NSTEMI.  She was started on heparin drip.  She underwent left heart cath today which revealed single-vessel obstructive CAD (critical 95% proximal stenosis in the RCA in an angulated segment.  This is followed by a 90% stenosis in the mid vessel.  The vessel is severely calcified.).  Cardiology planning on repeat cardiac catheterization tomorrow to consider PCI of the RCA (high risk intervention given her calcified vessel).  ?  ?Palliative care has been asked to get involved to further address goals of care. ? ?Today's Discussion (12/18/2021): ? ?*Please note that this is a verbal dictation therefore any spelling or grammatical errors are due to the "[place] One" system interpretation. ? ?Chart reviewed inclusive of vital signs, progress notes, laboratory results, and diagnostic images.  ? ?I met with [name] [name] and her granddaughter, [name] [name] at bedside. Patient just received dilaudid for pain so was a bit sleepy. Pain overall is gradually improving. The R foot boot has helped aid in this effort.  ? ?We reviewed that [name] [name] has had a BM yesterday and today. ? ? She has previously been able to work with PT and sustain sitting on the edge of the bed.  ? ?Discussed patients e/o chest pain yesterday which resolved on it's own. We discussed that this certainly can continue in the setting of her CAD. ? ?Plan for skilled nursing upon discharge. [name] [name] is hopeful for placement at [place] as a friend overseas their PT/OT. Reviewed  we will hopefully have better clarity of this early this week. ? ?Questions and concerns addressed  ? ?Palliative Support Provided ? ?Objective Assessment: ?Vital Signs ?Vitals:  ? 12/18/21 0411 12/18/21 0836  ?BP: (!) 120/56   ?Pulse: 75 78  ?Resp: 15 14  ?Temp:    ?SpO2: 96% 97%  ? ? ?Intake/Output Summary (Last 24 hours) at 12/18/2021 1059 ?Last data filed at 12/18/2021 0701 ?Gross per 24 hour  ?Intake --  ?Output 2200 ml  ?Net -2200 ml  ? ? ?Last Weight  Most recent update: 12/01/2021 10:03 PM  ? ? Weight  ?85.3 kg (188 lb 0.8 oz)  ?      ? ?  ? ?Gen:  Frail elderly F in NAD ?HEENT: moist mucous membranes ?CV: Regular rate and rhythm ?PULM: On 2LPM [place] ?ABD: soft/nontender ?EXT: R thigh warm, less taught, non tender ?Neuro: Alert and oriented x3 ? ?SUMMARY OF RECOMMENDATIONS   ?DNAR/DNI ?  ?Differences between comfort care and aggressive medical treatment reviewed --> Patient and family interested in treating what is treatable without aggressive invasive interventions. Would still like medical management - not ready for hospice.  ? ?PT/OT for --> Recommended SNF  ? ?TOC involved trying to coordinate SNF transfer --> Family will look at facilities today ? ?Plan for OP Palliative support on discharge ?  ?Palliative Care will continue to follow along ? ?Symptoms: ?R Hip Pain: ?- Dilaudid 79m PO Q3H ?- Robaxin and Tylenol Q6H ATC ?- Fentanyl 12.5-25mcg IV PRN Q2H for breakthrough pain ? ? Generalized  Weakness: ?- PT/OT ?- SNF recommended ? ?Constipation: ?- Colave 185m PO BID ?- Senna 2 Tabs PO BID ?- Bisacodyl 131mPR Qday PRN ? ?Insomnia: ?- Melatonin 1070mO QHS PRN ?  ?MDM - High ?______________________________________________________________________________________ ?MicTacey [name] ?Team Cell Phone: [phone] utilize secure chat with additional questions, if there is no response within 30 minutes please call the above phone number ? ?Palliative Medicine Team  providers are available by phone from 7am to 7pm daily and can be reached through the team cell phone.  ?Should this patient require assistance outside of these hours, please call the patient's attending physician. ? ? ? ? ?

## 2021-12-19 ENCOUNTER — Inpatient Hospital Stay (HOSPITAL_COMMUNITY): Payer: Medicare HMO

## 2021-12-19 DIAGNOSIS — J439 Emphysema, unspecified: Secondary | ICD-10-CM | POA: Diagnosis not present

## 2021-12-19 DIAGNOSIS — Z515 Encounter for palliative care: Secondary | ICD-10-CM | POA: Diagnosis not present

## 2021-12-19 DIAGNOSIS — F331 Major depressive disorder, recurrent, moderate: Secondary | ICD-10-CM | POA: Diagnosis not present

## 2021-12-19 DIAGNOSIS — S72141A Displaced intertrochanteric fracture of right femur, initial encounter for closed fracture: Secondary | ICD-10-CM | POA: Diagnosis not present

## 2021-12-19 DIAGNOSIS — I5032 Chronic diastolic (congestive) heart failure: Secondary | ICD-10-CM | POA: Diagnosis not present

## 2021-12-19 LAB — CBC WITH DIFFERENTIAL/PLATELET
Abs Immature Granulocytes: 0.21 10*3/uL — ABNORMAL HIGH (ref 0.00–0.07)
Basophils Absolute: 0.1 10*3/uL (ref 0.0–0.1)
Basophils Relative: 1 %
Eosinophils Absolute: 0.2 10*3/uL (ref 0.0–0.5)
Eosinophils Relative: 2 %
HCT: 31.3 % — ABNORMAL LOW (ref 36.0–46.0)
Hemoglobin: 9.8 g/dL — ABNORMAL LOW (ref 12.0–15.0)
Immature Granulocytes: 2 %
Lymphocytes Relative: 15 %
Lymphs Abs: 1.3 10*3/uL (ref 0.7–4.0)
MCH: 28.9 pg (ref 26.0–34.0)
MCHC: 31.3 g/dL (ref 30.0–36.0)
MCV: 92.3 fL (ref 80.0–100.0)
Monocytes Absolute: 1.1 10*3/uL — ABNORMAL HIGH (ref 0.1–1.0)
Monocytes Relative: 12 %
Neutro Abs: 6.3 10*3/uL (ref 1.7–7.7)
Neutrophils Relative %: 68 %
Platelets: 203 10*3/uL (ref 150–400)
RBC: 3.39 MIL/uL — ABNORMAL LOW (ref 3.87–5.11)
RDW: 16.9 % — ABNORMAL HIGH (ref 11.5–15.5)
WBC: 9.1 10*3/uL (ref 4.0–10.5)
nRBC: 0 % (ref 0.0–0.2)

## 2021-12-19 LAB — BASIC METABOLIC PANEL
Anion gap: 5 (ref 5–15)
BUN: 30 mg/dL — ABNORMAL HIGH (ref 8–23)
CO2: 29 mmol/L (ref 22–32)
Calcium: 8.6 mg/dL — ABNORMAL LOW (ref 8.9–10.3)
Chloride: 100 mmol/L (ref 98–111)
Creatinine, Ser: 0.81 mg/dL (ref 0.44–1.00)
GFR, Estimated: >60 mL/min (ref >60)
Glucose, Bld: 154 mg/dL — ABNORMAL HIGH (ref 70–99)
Potassium: 4.4 mmol/L (ref 3.5–5.1)
Sodium: 134 mmol/L — ABNORMAL LOW (ref 135–145)

## 2021-12-19 MED ORDER — LACTATED RINGERS IV BOLUS
500.0000 mL | Freq: Once | INTRAVENOUS | Status: AC
  Administered 2021-12-19: 500 mL via INTRAVENOUS

## 2021-12-19 MED ORDER — KETOROLAC TROMETHAMINE 15 MG/ML IJ SOLN
15.0000 mg | Freq: Three times a day (TID) | INTRAMUSCULAR | Status: AC | PRN
Start: 2021-12-19 — End: 2021-12-20
  Administered 2021-12-19 – 2021-12-20 (×3): 15 mg via INTRAVENOUS
  Filled 2021-12-19 (×3): qty 1

## 2021-12-19 MED ORDER — HYDROMORPHONE HCL 2 MG PO TABS
1.0000 mg | ORAL_TABLET | ORAL | Status: DC | PRN
  Administered 2021-12-20 (×2): 1 mg via ORAL
  Filled 2021-12-19 (×3): qty 1

## 2021-12-19 MED ORDER — IOHEXOL 350 MG/ML SOLN
80.0000 mL | Freq: Once | INTRAVENOUS | Status: AC | PRN
  Administered 2021-12-19: 80 mL via INTRAVENOUS

## 2021-12-19 NOTE — Progress Notes (Signed)
? ?Palliative Medicine Inpatient Follow Up Note ? ?HPI: ?76 year old female with a past medical history of hypertension, PVD, CKD stage IIIa, COPD, chronic diastolic CHF.  She was admitted to the hospital on 12/14/2021 after a fall at home.  She was found to have right clavicle fracture as well as right proximal femur fracture.  She was taken to the OR on 3/17 for intramedullary fixation of the right femur.  Patient experienced chest pain on 3/18 and found to have elevated troponin concerning for NSTEMI.  She was started on heparin drip.  She underwent left heart cath today which revealed single-vessel obstructive CAD (critical 95% proximal stenosis in the RCA in an angulated segment.  This is followed by a 90% stenosis in the mid vessel.  The vessel is severely calcified.).  Cardiology planning on repeat cardiac catheterization tomorrow to consider PCI of the RCA (high risk intervention given her calcified vessel).  ?  ?Palliative care has been asked to get involved to further address goals of care. ? ?Today's Discussion (12/19/2021): ? ?*Please note that this is a verbal dictation therefore any spelling or grammatical errors are due to the "[employer]" system interpretation. ? ?Chart reviewed inclusive of vital signs, progress notes, laboratory results, and diagnostic images.  ? ?Patient received 8mg  of PO dilaudid in the last 24 hours.  Has not required any IV fentanyl in > 24hrs. Appetites has been fair. Has had good bowel movements. ? ?CT Chest completed today for ongoing supplemental O2 needs.  ? ?While at bedside became a bit concerned about patients hypotension. I was able to alert Dr. Karleen Hampshire.  ? ?I spoke to patient and her daughter(s) regarding the concerns and the delicate balance of giving medication to treat symptoms while not bottoming out her blood pressure. I shared my concerns in light of patients pain and encroaching rehab about her inability to perhaps tolerate a vigorous regiment.Charnelle  shares right now she does not believe she could tolerate rehab as even small slight movements cause pain.  ? ?We reviewed how all of the above concerns are only amplified with the other medical issues that Yanixa is contending with such as her occlusive disease. I shared the family may want to have an open and honest conversation about matter such as how far to go - if Truby would wish to be in the ICU again?  ? ?For the time being the primary medical team has ordered a bolus though they are trying to also not volume overload Iyanna as her PO hydration has been abundant. ? ?Questions and concerns addressed  ? ?Palliative Support Provided and will continue in the oncoming days ? ?Objective Assessment: ?Vital Signs ?Vitals:  ? 12/19/21 0836 12/19/21 1126  ?BP: (!) 113/55 (!) 108/53  ?Pulse: 87 82  ?Resp:    ?Temp:    ?SpO2:  100%  ? ? ?Intake/Output Summary (Last 24 hours) at 12/19/2021 1452 ?Last data filed at 12/19/2021 1004 ?Gross per 24 hour  ?Intake --  ?Output 701 ml  ?Net -701 ml  ? ? ?Last Weight  Most recent update: 12/02/2021 10:03 PM  ? ? Weight  ?85.3 kg (188 lb 0.8 oz)  ?      ? ?  ? ?Gen:  Frail elderly F in NAD ?HEENT: moist mucous membranes ?CV: Regular rate and rhythm ?PULM: On 2LPM [place] ?ABD: soft/nontender ?EXT: R thigh warm, less taught, non tender ?Neuro: Alert and oriented x3 ? ?SUMMARY OF RECOMMENDATIONS   ?DNAR/DNI ?  ?Patient clinical  complexity is significant in the setting of her pain burden, soft pressures, and co-morbidities.  ? ?TOC for SNF placement ? ?Plan for OP Palliative support on discharge ?  ?Palliative Care will continue to follow along ? ?Symptoms: ?R Hip Pain: ?- Dilaudid 2mg  PO Q3H ?- Robaxin and Tylenol Q6H ATC ?- Fentanyl 12.5-25mcg IV PRN Q2H for breakthrough pain ? ? Generalized Weakness: ?- PT/OT ?- SNF  ?- Family interest in [place] ? ?Constipation: ?- Colave 100mg  PO BID ?- Senna 2 Tabs PO BID ?- Bisacodyl 10mg  PR Qday PRN ? ?Insomnia: ?- Melatonin 10mg  PO QHS PRN ?  ?MDM  - High ?______________________________________________________________________________________ ?Tacey Ruiz ?[place] Team ?Team Cell Phone: [phone] ?Please utilize secure chat with additional questions, if there is no response within 30 minutes please call the above phone number ? ?Palliative Medicine Team providers are available by phone from 7am to 7pm daily and can be reached through the team cell phone.  ?Should this patient require assistance outside of these hours, please call the patient's attending physician. ? ? ? ? ?

## 2021-12-19 NOTE — Progress Notes (Signed)
?PROGRESS NOTE ? ? ? ?[name] [name]  [id number] DOB: July 24, 1946 DOA: 12/04/2021 ?PCP: Ria Bush, MD  ? ? ?Chief Complaint  ?Patient presents with  ? Fall  ? ? ?Brief Narrative:  ?76 year old lady with prior history of hypertension stage III CKD peripheral vascular disease comes in after a mechanical fall.  She was found to have right clavicle fracture as well as a right proximal femur fracture.  Orthopedics consulted and she underwent intramedullary fixation of the right femur,.  Hospital course complicated by postoperative NSTEMI as well as persistent acute blood loss anemia secondary to hematoma formation. ?Patient underwent urgent cath which showed heavily calcified RCA lesion was not intervened, recommended outpatient follow-up with cardiology on discharge. ?Medical management at this time from cardiac standpoint. Unable to put her on aspirin or plavix due to right groin hematoma leading to blood loss anemia. Over the last 48 hours pt's hemoglobin has been stable, and she is working with PT. Recommend outpatient follow up with cardiology sooner to see if she can be started on plavix.  ? ?Daughter is concerned that patient's oxygen requirement increases to 4 lit/min overnight when she sleeps. During the day she is requiring upto 2 lit /min. Overnight she required 6 mg of oral dilaudid to keep her pain at bay.  ? ? ?Assessment & Plan: ?  ?Principal Problem: ?  Closed intertrochanteric fracture of hip, right, initial encounter ([place]) ?Active Problems: ?  Right clavicle fracture ?  Hypertension ?  Peripheral vascular disease ([place]) ?  COPD (chronic obstructive pulmonary disease) ([place]) ?  MDD (major depressive disorder), recurrent episode, moderate ([place]) ?  Osteoporosis ?  Hypothyroidism (acquired) ?  Chronic diastolic heart failure ([place]) ?  Intertrigo ?  Stage 3a chronic kidney disease (CKD) ([place]) ?  Neuropathy ?  NSTEMI (non-ST elevated myocardial infarction) ([place]) ? ? ?NSTEMI ?Patient reported chest  pain postsurgery. ?EKG showed depressed ST segments in the lateral leads. ?Troponins were elevated.  She underwent cardiac cath was found to have a severe single-vessel disease with heavily calcified more than 95% stenosis on her RCA.  This could not be intervened upon.  Cardiology recommended follow-up as an outpatient. She is not a candidate for any invasive treatment.  ?Currently not on aspirin, Plavix or heparin due to acute blood loss anemia. ?EKG reviewed , just PVC'S. She was  on metoprolol 12.5 mg BID, and imdur 15 mg daily which have been discontinued due to hypotension. ?No chest pain today.  ? ? ?Closed intertrochanteric fracture of the right hip ?Orthopedics consulted and she was taken to the OR on 3/17 by Dr. Lyla Glassing.  She is s/p intramedullary fixation of the right femur. ?Weightbearing as tolerated. ?Therapy evaluations recommending SNF. ?Pain control with oral medications at this time. ?Recommend outpatient follow up with orthopedics.  ?Daughter concerned that pt cannot get out of bed to get to the bathroom and use the bed side commode at the facility if she is discharged sooner. She wants the patient to be able to do those things before she can be discharged to SNF.  ? ? ?Acute anemia of blood loss s/p 3 units of PRBC transfusion. ?Repeat hemoglobin stable around 9.  CT of the abdomen and pelvis does not show retroperitoneal bleed.Heterogeneous lesion of the right groin, favored to be due to hematoma. However, given rounded appearance, recommend ultrasound ?for more definitive characterization since an enlarged lymph node could have a similar appearance. US of the right groin ordered for further evaluation.  ? ?Right  lower extremity swelling-likely related to recent surgery/immobility, but ruled out DVT with a Doppler ultrasound. ? Hematoma appears to be stable. Hemoglobin stable around 9.1. and 9.2 to 9.8.  ? ? ? ? ?Chronic diastolic heart failure ?She appears to be  slightly volume overloaded at  this time. Unable to diurese her due to low BP parameters.  ?Continue with strict intake and output and daily weights. ? ?Hyperlipidemia ?Continue with statin and Zetia. ? ?History of COPD ?No wheezing on exam. But diminished air entry on exam today.  ?She is on 2 lit of [place] oxygen.  ?Overnight she is requiring upto 4 lit of [place] oxygen possibly due to dilaudid use and ?   ?We will check CT chest with contrast to evaluate for persistent hypoxia during the night.  ? ? ? ?Peripheral vascular disease ?Holding aspirin and plavix , continue with statin.  ? ? ? ?Essential hypertension ?Pt has been hypotensive today, we will g ahead and stop the imdur and metoprolol.  ?Order a small fluid bolus to see if her BP parameters improve.  ? ? ? ?Stage IIIa CKD ?Creatinine appears to be at baseline, continue to monitor. ? ? ? ?Right clavicular fracture ?Continue with the sling on the right arm ?Nonweightbearing recommended. ?Recommend outpatient follow up with orthopedics.  ? ? ?Right foot pain , unable to bear weight,  ?- x rays  are negative. Much improved today.  ?Uric acid levels wnl.  ? ? ?Hypothyroidism:  ?Continue with synthroid.  ? ?Thrombocytopenia:  ?Resolved.  ? ?Constipation:  ?Resolved.  ?Currently on senna colace and miralax.  ? ?Pt very deconditioned, poor progression, critical stenosis of RCA , not a candidate for intervention at this time, borderline low BP's, unable to anticoagulate due to hematoma, has poor prognosis.  ?Would recommend goals of care discussions with palliative care as she has not made much progress.  ? ? ?DVT prophylaxis: scd's ?Code Status: DNR ?Family Communication: family at bedside.  ?Disposition:  ? ?Status is: Inpatient ?Remains inpatient appropriate because: right foot pain and further work up for gout.  ?  ?Consultants:  ?Cardiology ?Orthopedics.  ? ?Procedures: status post intramedullary fixation of the right femur ? ?Antimicrobials:  ?Antibiotics Given (last 72 hours)   ? ? None  ? ?   ? ? ? ? ?Subjective: ?Pain is better, had a bowel movement. No nausea, vomiting.  ? ? ?Objective: ?Vitals:  ? 12/19/21 0439 12/19/21 0748 12/19/21 0836 12/19/21 1126  ?BP: (!) 122/53 (!) 99/50 (!) 113/55 (!) 108/53  ?Pulse: 84 89 87 82  ?Resp:  15    ?Temp: 98.7 ?F (37.1 ?C) 98.3 ?F (36.8 ?C)    ?TempSrc: Oral Axillary    ?SpO2: 97% 94%  100%  ?Weight:      ?Height:      ? ? ?Intake/Output Summary (Last 24 hours) at 12/19/2021 1305 ?Last data filed at 12/19/2021 1004 ?Gross per 24 hour  ?Intake --  ?Output 701 ml  ?Net -701 ml  ? ? ?Filed Weights  ? 12/14/2021 2100  ?Weight: 85.3 kg  ? ? ?Examination: ? ?General exam: ill appearing lady, hypoxic, lethargic. On 2 to 3 lit of [place] oxygen.  ?Respiratory system: Diminished air entry at bases. On oxygen.  ?Cardiovascular system: S1 & S2 heard, RRR.  No pedal edema. ?Gastrointestinal system: Abdomen is nondistended, soft and non tender . Normal bowel sounds heard. ?Central nervous system: lethargic, but able to answer all questions appropriately.  ?Extremities: Symmetric 5 x 5 power. ?Skin:  No rashes, groin hematoma improving.  ?Psychiatry: flat affect.  ? ? ? ? ? ? ?Data Reviewed: I have personally reviewed following labs and imaging studies ? ?CBC: ?Recent Labs  ?Lab 12/09/2021 ?2149 12/14/21 ?0136 12/15/21 ?[id number] 12/16/21 ?0304 12/17/21 ?0409  ?WBC 8.9 8.0 7.8 7.3 7.3  ?HGB 9.2* 8.7* 9.4* 9.1* 9.2*  ?HCT 27.7* 26.3* 29.0* 28.7* 29.2*  ?MCV 87.1 88.0 89.8 90.5 92.1  ?PLT 98* 100* 111* 129* 161  ? ? ? ?Basic Metabolic Panel: ?Recent Labs  ?Lab 11/30/2021 ?0242 12/14/21 ?0136 12/15/21 ?0434 12/17/21 ?0409  ?NA 132* 137 137 136  ?K 4.7 4.3 4.8 4.9  ?CL 101 101 102 97*  ?CO2 27 30 27  32  ?GLUCOSE 133* 123* 119* 122*  ?BUN 19 15 15  26*  ?CREATININE 0.84 0.71 0.63 0.82  ?CALCIUM 7.7* 7.8* 8.4* 8.8*  ? ? ? ?GFR: ?Estimated Creatinine Clearance: 53.6 mL/min (by C-G formula based on SCr of 0.82 mg/dL). ? ?Liver Function Tests: ?Recent Labs  ?Lab 12/23/2021 ?0242 12/14/21 ?0136  12/15/21 ?[id number]  ?AST 78* 62* 46*  ?ALT 11 13 15   ?ALKPHOS 69 68 67  ?BILITOT 0.5 1.0 1.0  ?PROT 4.7* 5.2* 5.7*  ?ALBUMIN 2.5* 2.7* 2.7*  ? ? ? ?CBG: ?Recent Labs  ?Lab 11/29/2021 ?1552  ?GLUCAP 128*  ? ? ? ? ?Recent Resu

## 2021-12-20 DIAGNOSIS — J439 Emphysema, unspecified: Secondary | ICD-10-CM | POA: Diagnosis not present

## 2021-12-20 DIAGNOSIS — I5032 Chronic diastolic (congestive) heart failure: Secondary | ICD-10-CM | POA: Diagnosis not present

## 2021-12-20 DIAGNOSIS — F331 Major depressive disorder, recurrent, moderate: Secondary | ICD-10-CM | POA: Diagnosis not present

## 2021-12-20 DIAGNOSIS — S72141A Displaced intertrochanteric fracture of right femur, initial encounter for closed fracture: Secondary | ICD-10-CM | POA: Diagnosis not present

## 2021-12-20 MED ORDER — HYDROMORPHONE 1 MG/ML IV SOLN: INTRAVENOUS | Status: DC

## 2021-12-20 MED ORDER — SODIUM CHLORIDE 0.9% FLUSH: 9.0000 mL | INTRAVENOUS | Status: DC | PRN

## 2021-12-20 MED ORDER — BIOTENE DRY MOUTH MT LIQD: 15.0000 mL | OROMUCOSAL | Status: DC | PRN

## 2021-12-20 MED ORDER — HYDROMORPHONE HCL 2 MG PO TABS
1.0000 mg | ORAL_TABLET | ORAL | Status: DC | PRN
  Administered 2021-12-20: 1 mg via ORAL
  Filled 2021-12-20 (×2): qty 1

## 2021-12-20 MED ORDER — GLYCOPYRROLATE 0.2 MG/ML IJ SOLN
0.2000 mg | INTRAMUSCULAR | Status: DC | PRN
  Filled 2021-12-20: qty 1

## 2021-12-20 MED ORDER — HYDROMORPHONE 1 MG/ML IV SOLN
INTRAVENOUS | Status: DC
  Filled 2021-12-20: qty 30

## 2021-12-20 MED ORDER — HYDROMORPHONE HCL 1 MG/ML IJ SOLN
2.0000 mg | Freq: Once | INTRAMUSCULAR | Status: AC
  Administered 2021-12-20: 2 mg via INTRAVENOUS
  Filled 2021-12-20: qty 2

## 2021-12-20 MED ORDER — DIPHENHYDRAMINE HCL 12.5 MG/5ML PO ELIX: 12.5000 mg | ORAL_SOLUTION | Freq: Four times a day (QID) | ORAL | Status: DC | PRN

## 2021-12-20 MED ORDER — DIPHENHYDRAMINE HCL 50 MG/ML IJ SOLN: 12.5000 mg | Freq: Four times a day (QID) | INTRAMUSCULAR | Status: DC | PRN

## 2021-12-20 MED ORDER — NALOXONE HCL 0.4 MG/ML IJ SOLN: 0.4000 mg | INTRAMUSCULAR | Status: DC | PRN

## 2021-12-20 MED ORDER — KETOROLAC TROMETHAMINE 15 MG/ML IJ SOLN
15.0000 mg | Freq: Three times a day (TID) | INTRAMUSCULAR | Status: AC
  Administered 2021-12-20: 15 mg via INTRAVENOUS
  Filled 2021-12-20: qty 1

## 2021-12-20 MED ORDER — GLYCOPYRROLATE 0.2 MG/ML IJ SOLN
0.2000 mg | INTRAMUSCULAR | Status: DC | PRN
  Administered 2021-12-21: 0.2 mg via INTRAVENOUS
  Filled 2021-12-20 (×3): qty 1

## 2021-12-20 MED ORDER — GABAPENTIN 100 MG PO CAPS
100.0000 mg | ORAL_CAPSULE | Freq: Two times a day (BID) | ORAL | Status: DC
  Administered 2021-12-20 (×2): 100 mg via ORAL
  Filled 2021-12-20 (×2): qty 1

## 2021-12-20 MED ORDER — POLYVINYL ALCOHOL 1.4 % OP SOLN
1.0000 [drp] | Freq: Four times a day (QID) | OPHTHALMIC | Status: DC | PRN
  Filled 2021-12-20: qty 15

## 2021-12-20 MED ORDER — MIDODRINE HCL 5 MG PO TABS
2.5000 mg | ORAL_TABLET | Freq: Two times a day (BID) | ORAL | Status: DC
Start: 2021-12-20 — End: 2021-12-21
  Administered 2021-12-20 (×2): 2.5 mg via ORAL
  Filled 2021-12-20 (×2): qty 1

## 2021-12-20 MED ORDER — GLYCOPYRROLATE 1 MG PO TABS
1.0000 mg | ORAL_TABLET | ORAL | Status: DC | PRN
  Filled 2021-12-20: qty 1

## 2021-12-20 MED ORDER — LORAZEPAM 2 MG/ML IJ SOLN: 0.5000 mg | INTRAMUSCULAR | Status: DC | PRN

## 2021-12-20 NOTE — Progress Notes (Signed)
? ?  Palliative Medicine Inpatient Follow Up Note ? ?I was called to bedside this evening by patient's granddaughter, [name]. ? ?Patient this afternoon shares that she is horrific pain. We reviewed the various roads we can take in terms of do we continue the more aggressive path of care or do we shift gears to comfort care.  ? ?[name] is very clear about her desires to be comfortable. ? ?We talked about transition to comfort measures in house and what that would entail inclusive of medications to control pain, dyspnea, agitation, nausea, itching, and hiccups.  We discussed stopping all uneccessary measures such as cardiac monitoring, blood draws, needle sticks, and frequent vital signs. ? ?I shared that it is my job at this point to advocate for what [name] [name]'s wishes are. She presently wishes to be comfortable and is prepared for end of life.  ? ?Patients granddaughter [name] was privi to this conversation and also called patients daughter, Precious Bard. They are all presently willing to respect and honor Amaurie's decision.  ? ?SUMMARY OF RECOMMENDATIONS   ?DNAR/DNI ? ?Comfort Focused Care ? ?Low Dose Dilaudid PCA ordered for symptom control ? ?Unrestricted Visitation ? ?Ongoing Palliative support ? ?Time Spent: 60 ? ?MDM - High ? ?_____________________________________________________________________________________ ?Tacey Ruiz ?[place] Team ?Team Cell Phone: [phone] ?Please utilize secure chat with additional questions, if there is no response within 30 minutes please call the above phone number ? ?Palliative Medicine Team providers are available by phone from 7am to 7pm daily and can be reached through the team cell phone.  ?Should this patient require assistance outside of these hours, please call the patient's attending physician. ? ? ? ? ?

## 2021-12-20 NOTE — Progress Notes (Signed)
?PROGRESS NOTE ? ? ? ?[name] [name]  [id number] DOB: 28-Jul-1946 DOA: 12/18/2021 ?PCP: Ria Bush, MD  ? ? ?Chief Complaint  ?Patient presents with  ? Fall  ? ? ?Brief Narrative:  ?76 year old lady with prior history of hypertension stage III CKD peripheral vascular disease comes in after a mechanical fall.  She was found to have right clavicle fracture as well as a right proximal femur fracture.  Orthopedics consulted and she underwent intramedullary fixation of the right femur,.  Hospital course complicated by postoperative NSTEMI as well as persistent acute blood loss anemia secondary to hematoma formation. ?Patient underwent urgent cath which showed heavily calcified RCA lesion was not intervened, recommended outpatient follow-up with cardiology on discharge. ?Medical management at this time from cardiac standpoint. Unable to put her on aspirin or plavix due to right groin hematoma leading to blood loss anemia. Over the last 48 hours pt's hemoglobin has been stable, and she is working with PT. Recommend outpatient follow up with cardiology sooner to see if she can be started on plavix.  ? ?Daughter is concerned that patient's oxygen requirement increases to 4 lit/min overnight when she sleeps. During the day she is requiring upto 2 lit /min. Overnight she required 6 mg of oral dilaudid to keep her pain at bay.  ? ?After further discussions with palliative care, the patient wanted to shift to comfort care.  ? ? ?Assessment & Plan: ?  ?Principal Problem: ?  Closed intertrochanteric fracture of hip, right, initial encounter ([place]) ?Active Problems: ?  Right clavicle fracture ?  Hypertension ?  Peripheral vascular disease ([place]) ?  COPD (chronic obstructive pulmonary disease) ([place]) ?  MDD (major depressive disorder), recurrent episode, moderate ([place]) ?  Osteoporosis ?  Hypothyroidism (acquired) ?  Chronic diastolic heart failure ([place]) ?  Intertrigo ?  Stage 3a chronic kidney disease (CKD) ([place]) ?   Neuropathy ?  NSTEMI (non-ST elevated myocardial infarction) ([place]) ? ? ?NSTEMI ?Patient reported chest pain postsurgery. ?EKG showed depressed ST segments in the lateral leads. ?Troponins were elevated.  She underwent cardiac cath was found to have a severe single-vessel disease with heavily calcified more than 95% stenosis on her RCA.  This could not be intervened upon.  Cardiology recommended follow-up as an outpatient. She is not a candidate for any invasive treatment.  ?Currently not on aspirin, Plavix or heparin due to acute blood loss anemia. ?EKG reviewed , just PVC'S. She was  on metoprolol 12.5 mg BID, and imdur 15 mg daily which have been discontinued due to hypotension. ?No chest pain today.  ? ? ?Closed intertrochanteric fracture of the right hip ?Orthopedics consulted and she was taken to the OR on 3/17 by Dr. Lyla Glassing.  She is s/p intramedullary fixation of the right femur. ?Weightbearing as tolerated. ?Recommend outpatient follow up with orthopedics.  ? ? ? ?Acute anemia of blood loss s/p 3 units of PRBC transfusion. ?Repeat hemoglobin stable around 9.  CT of the abdomen and pelvis does not show retroperitoneal bleed.Heterogeneous lesion of the right groin, favored to be due to hematoma. However, given rounded appearance, recommend ultrasound ?for more definitive characterization since an enlarged lymph node could have a similar appearance. US of the right groin ordered for further evaluation.  ? ?Right lower extremity swelling-likely related to recent surgery/immobility, but ruled out DVT with a Doppler ultrasound. ? Hematoma appears to be stable. Hemoglobin stable around 9.1. and 9.2 to 9.8.  ? ? ? ? ?Chronic diastolic heart failure ?She appears to be  slightly volume overloaded at this time. Unable to diurese her due to low BP parameters.  ? ? ?Hyperlipidemia ?Continue with statin and Zetia. ? ?History of COPD ?No wheezing on exam. But diminished air entry on exam today.  ?She is on 2 lit of [place]  oxygen.  ?Overnight she is requiring upto 4 lit of [place] oxygen possibly due to dilaudid use and ?   ?CT chest is negative for acute findings ? ? ? ?Peripheral vascular disease ?Holding aspirin and plavix due to hematoma.  ? ? ? ?Essential hypertension ?Borderline. Low.  ? ? ? ?Stage IIIa CKD ?Creatinine appears to be at baseline, continue to monitor. ? ? ? ?Right clavicular fracture ?Continue with the sling on the right arm ?Nonweightbearing recommended. ? ? ? ?Right foot pain , unable to bear weight,  ?- x rays  are negative. Much improved today.  ?Uric acid levels wnl.  ? ? ?Hypothyroidism:  ?Continue with synthroid.  ? ?Thrombocytopenia:  ?Resolved.  ? ?Constipation:  ?Resolved.  ?Currently on senna colace and miralax.  ? ?Pt very deconditioned, poor progression, critical stenosis of RCA , not a candidate for intervention at this time, borderline low BP's, unable to anticoagulate due to hematoma, has poor prognosis.  ?Would recommend goals of care discussions with palliative care as she has not made much progress.  ? ? ?DVT prophylaxis: scd's ?Code Status: DNR ?Family Communication: family at bedside.  ?Disposition:  ? ?Status is: Inpatient ?Remains inpatient appropriate because: right foot pain and further work up for gout.  ?  ?Consultants:  ?Cardiology ?Orthopedics.  ? ?Procedures: status post intramedullary fixation of the right femur ? ?Antimicrobials:  ?Antibiotics Given (last 72 hours)   ? ? None  ? ?  ? ? ? ? ?Subjective: ?Reports not doing good. ? ?Objective: ?Vitals:  ? 12/20/21 0947 12/20/21 1050 12/20/21 1057 12/20/21 1620  ?BP:  (!) 103/29 (!) 106/43 93/81  ?Pulse: 64 (!) 42 73   ?Resp: 18 19 20  (!) 21  ?Temp:      ?TempSrc:      ?SpO2: 96% 95% 93% 97%  ?Weight:      ?Height:      ? ? ?Intake/Output Summary (Last 24 hours) at 12/20/2021 1857 ?Last data filed at 12/19/2021 2205 ?Gross per 24 hour  ?Intake --  ?Output 450 ml  ?Net -450 ml  ? ? ?Filed Weights  ? 11/28/2021 2100  ?Weight: 85.3 kg   ? ? ?Examination: ? ?General exam: ill appearing lady, hypoxic, lethargic. On 2 to 3 lit of [place] oxygen.  ?Respiratory system: Diminished air entry at bases. On oxygen.  ?Cardiovascular system: S1 & S2 heard, RRR.  No pedal edema. ?Gastrointestinal system: Abdomen is nondistended, soft and non tender . Normal bowel sounds heard. ?Central nervous system: lethargic, but able to answer all questions appropriately.  ?Extremities: Symmetric 5 x 5 power. ?Skin: No rashes, groin hematoma improving.  ?Psychiatry: flat affect.  ? ? ? ? ? ? ?Data Reviewed: I have personally reviewed following labs and imaging studies ? ?CBC: ?Recent Labs  ?Lab 12/14/21 ?0136 12/15/21 ?0434 12/16/21 ?0304 12/17/21 ?0409 12/19/21 ?1340  ?WBC 8.0 7.8 7.3 7.3 9.1  ?NEUTROABS  --   --   --   --  6.3  ?HGB 8.7* 9.4* 9.1* 9.2* 9.8*  ?HCT 26.3* 29.0* 28.7* 29.2* 31.3*  ?MCV 88.0 89.8 90.5 92.1 92.3  ?PLT 100* 111* 129* 161 203  ? ? ? ?Basic Metabolic Panel: ?Recent Labs  ?Lab 12/14/21 ?0136 12/15/21 ?  [id number] 12/17/21 ?0409 12/19/21 ?1340  ?NA 137 137 136 134*  ?K 4.3 4.8 4.9 4.4  ?CL 101 102 97* 100  ?CO2 30 27 32 29  ?GLUCOSE 123* 119* 122* 154*  ?BUN 15 15 26* 30*  ?CREATININE 0.71 0.63 0.82 0.81  ?CALCIUM 7.8* 8.4* 8.8* 8.6*  ? ? ? ?GFR: ?Estimated Creatinine Clearance: 54.3 mL/min (by C-G formula based on SCr of 0.81 mg/dL). ? ?Liver Function Tests: ?Recent Labs  ?Lab 12/14/21 ?0136 12/15/21 ?[id number]  ?AST 62* 46*  ?ALT 13 15  ?ALKPHOS 68 67  ?BILITOT 1.0 1.0  ?PROT 5.2* 5.7*  ?ALBUMIN 2.7* 2.7*  ? ? ? ?CBG: ?No results for input(s): GLUCAP in the last 168 hours. ? ? ? ?No results found for this or any previous visit (from the past 240 hour(s)). ? ?  ? ? ? ? ? ?Radiology Studies: ?CT CHEST W CONTRAST ? ?Result Date: 12/19/2021 ?CLINICAL DATA:  Dyspnea, chronic of unclear etiology. Hypoxia requiring supplemental oxygen. EXAM: CT CHEST WITH CONTRAST TECHNIQUE: Multidetector CT imaging of the chest was performed during intravenous contrast administration.  RADIATION DOSE REDUCTION: This exam was performed according to the departmental dose-optimization program which includes automated exposure control, adjustment of the mA and/or kV according to patient size and/or use of iter

## 2021-12-20 NOTE — Progress Notes (Addendum)
? ?Palliative Medicine Inpatient Follow Up Note ? ?HPI: ?76 year old female with a past medical history of hypertension, PVD, CKD stage IIIa, COPD, chronic diastolic CHF.  She was admitted to the hospital on 12/10/2021 after a fall at home.  She was found to have right clavicle fracture as well as right proximal femur fracture.  She was taken to the OR on 3/17 for intramedullary fixation of the right femur.  Patient experienced chest pain on 3/18 and found to have elevated troponin concerning for NSTEMI.  She was started on heparin drip.  She underwent left heart cath today which revealed single-vessel obstructive CAD (critical 95% proximal stenosis in the RCA in an angulated segment.  This is followed by a 90% stenosis in the mid vessel.  The vessel is severely calcified.).  Cardiology planning on repeat cardiac catheterization tomorrow to consider PCI of the RCA (high risk intervention given her calcified vessel).  ?  ?Palliative care has been asked to get involved to further address goals of care. ? ?Today's Discussion (12/20/2021): ? ?*Please note that this is a verbal dictation therefore any spelling or grammatical errors are due to the "[place]" system interpretation. ? ?Chart reviewed inclusive of vital signs, progress notes, laboratory results, and diagnostic images.  ? ?Patients dilaudid decreased last night to 1mg  Q3H PRN in the setting of soft pressures. ? ?This morning patients daughter, [name] [name] endorses that she slept poorly overnight. She shares concern over patients O2 sat and requests a mask as patient is a mouth breather.  ? ?Questions and concerns addressed  ? ?Palliative Support Provided and will continue in the oncoming days ? ?Patients family has selected for patient to "[facility]" ? ?Objective Assessment: ?Vital Signs ?Vitals:  ? 12/20/21 1050 12/20/21 1057  ?BP: (!) 103/29 (!) 106/43  ?Pulse: (!) 42 73  ?Resp: 19 20  ?Temp:    ?SpO2: 95% 93%  ? ? ?Intake/Output Summary (Last 24  hours) at 12/20/2021 1058 ?Last data filed at 12/19/2021 2205 ?Gross per 24 hour  ?Intake 500 ml  ?Output 450 ml  ?Net 50 ml  ? ? ?Last Weight  Most recent update: 12/08/2021 10:03 PM  ? ? Weight  ?85.3 kg (188 lb 0.8 oz)  ?      ? ?  ? ?Gen:  [place] elderly F in NAD ?HEENT: moist mucous membranes ?CV: Regular rate and rhythm ?PULM: On 2LPM [place] ?ABD: soft/nontender ?EXT: R thigh warm, non-tender ?Neuro: Alert and oriented x3 - Sleepy ? ?SUMMARY OF RECOMMENDATIONS   ?DNAR/DNI ?  ?Patient clinical complexity is significant in the setting of her pain burden, soft pressures, and co-morbidities.  ? ?TOC for SNF placement "[facility]" ? ?Plan for OP Palliative support on discharge ?  ?Palliative Care will continue to follow along ? ?Symptoms: ?R Hip Pain: ?- Dilaudid 1mg  PO Q3H ?- Robaxin and Tylenol Q6H ATC ? ? Generalized Weakness: ?- PT/OT ?- SNF  ?- Family interest in [place] ? ?Constipation: ?- Colave 100mg  PO BID ?- Senna 2 Tabs PO BID ?- Bisacodyl 10mg  PR Qday PRN ? ?Insomnia: ?- Melatonin 10mg  PO QHS PRN ?  ?MDM - High ?______________________________________________________________________________________ ?[name] [name] ?[place] Team ?Team Cell Phone: [phone] ?Please utilize secure chat with additional questions, if there is no response within 30 minutes please call the above phone number ? ?Palliative Medicine Team providers are available by phone from 7am to 7pm daily and can be reached through the team cell phone.  ?Should this patient require  assistance outside of these hours, please call the patient's attending physician. ? ? ? ? ?

## 2021-12-20 NOTE — TOC Progression Note (Addendum)
Transition of Care (TOC) - Progression Note  ? ? ?Patient Details  ?Name: [name] ?MRN: [id number] ?Date of Birth: 1946/04/10 ? ?Transition of Care (TOC) CM/SW Contact  ?Lorri Frederick, LCSW ?Phone Number: ?12/20/2021, 9:56 AM ? ?Clinical Narrative:    ?[facility] does not offer bed. ? ?CSW spoke with daughter Alexia Freestone and pt, they want to accept offer to [facility].  CSW reached out to [facility] to request they initiate insurance auth.  ? ?1315: CSW confirmed with [facility] that they have initiated auth request.  CSW also spoke with daughter [name] and confirmed choice of [facility] with her.   ? ?Expected Discharge Plan: Skilled Nursing Facility ?Barriers to Discharge: Continued Medical Work up ? ?Expected Discharge Plan and Services ?Expected Discharge Plan: Skilled Nursing Facility ?In-house Referral: Clinical Social Work ?  ?  ?Living arrangements for the past 2 months: Single Family Home ?                ?  ?  ?  ?  ?  ?  ?  ?  ?  ?  ? ? ?Social Determinants of Health (SDOH) Interventions ?  ? ?Readmission Risk Interventions ? ?  12/26/2019  ?  1:04 PM  ?Readmission Risk Prevention Plan  ?Transportation Screening Complete  ?PCP or Specialist Appt within 5-7 Days Complete  ?Home Care Screening Complete  ?Medication Review (RN CM) Complete  ? ? ?

## 2021-12-20 NOTE — Progress Notes (Addendum)
Occupational Therapy Treatment ?Patient Details ?Name: [name] [name] ?MRN: [id number] ?DOB: 05/29/1946 ?Today's Date: 12/20/2021 ? ? ?History of present illness Patient is a 76 y/o female who presents on 11/29/2021 s/p fall and found to have right clavicle fracture as well as right proximal femur fracture now s/p IM nail right femur 3/17. Found to have NSTEMI on 12/11/21. Cath Lab on 3/19 found to have single-vessel disease with heavily calcified more than 95% stenosis on her RCA, not able to be intervened upon. Cardiology following for potential further intervention vs medical management alone. PMH includes CVA, COPD, CHF, PVD, OA, osteoporosis. ?  ?OT comments ? Pt. Seen for skilled treatment session with PT/OT.  Pt. Agreeable to participation.  Completed bed mobility and seated eob with focus on sitting balance and increasing activity tolerance.  Pt. Remains 2 person assist for all bed mobility and sitting balance.  Education and demo provided for UE ROM as part of UE HEP.  (Refer to PT notes for LB HEP details).  Continue to progress bed mobility and activity tolerance next session as pt. Able.    ? ?Recommendations for follow up therapy are one component of a multi-disciplinary discharge planning process, led by the attending physician.  Recommendations may be updated based on patient status, additional functional criteria and insurance authorization. ?   ?Follow Up Recommendations ? Skilled nursing-short term rehab (<3 hours/day)  ?  ?Assistance Recommended at Discharge Frequent or constant Supervision/Assistance  ?Patient can return home with the following ? Two people to help with walking and/or transfers;Two people to help with bathing/dressing/bathroom ?  ?Equipment Recommendations ? Other (comment)  ?  ?Recommendations for Other Services   ? ?  ?Precautions / Restrictions Precautions ?Precautions: Fall;Other (comment) ?Precaution Comments: watch BP esp after narcotics ?Required Braces or Orthoses:  Sling ?Restrictions ?Weight Bearing Restrictions: Yes ?RUE Weight Bearing: Weight bearing as tolerated ?RLE Weight Bearing: Non weight bearing ?Other Position/Activity Restrictions: PRAFO for R ankle  ? ? ?  ? ?Mobility Bed Mobility ?Overal bed mobility: Needs Assistance ?  ?  ?  ?Supine to sit: Total assist, +2 for physical assistance, HOB elevated ?Sit to supine: Total assist, +2 for physical assistance ?  ?General bed mobility comments: minA to LLE, totalA to RLE, totalA of 2 for trunk. pt assisting with LUE on rail and with RLE. total A of 2 to return to supine using helicopter transfer. ?  ? ?Transfers ?  ?  ?  ?  ?  ?  ?  ?  ?  ?  ?  ?  ?Balance Overall balance assessment: Needs assistance ?Sitting-balance support: Feet supported, Single extremity supported ?Sitting balance-Leahy Scale: Poor ?Sitting balance - Comments: able to maintain intermittently without UE support, posterior lean with fatigue and needing verbal/tactile cues to correct, able to tolerate ~5 mins static sitting ?Postural control: Posterior lean ?  ?  ?  ?  ?  ?  ?  ?  ?  ?  ?  ?  ?  ?  ?   ? ?ADL either performed or assessed with clinical judgement  ? ?ADL   ?  ?  ?  ?  ?  ?  ?  ?  ?  ?  ?  ?  ?  ?  ?  ?  ?  ?  ?  ?  ?  ? ?Extremity/Trunk Assessment   ?  ?  ?  ?  ?  ? ?Vision   ?  ?  ?Perception   ?  ?  Praxis   ?  ? ?Cognition Arousal/Alertness: Awake/alert ?Behavior During Therapy: Flat affect ?Overall Cognitive Status: Impaired/Different from baseline ?Area of Impairment: Problem solving ?  ?  ?  ?  ?  ?  ?  ?  ?  ?  ?  ?  ?  ?  ?Problem Solving: Slow processing ?General Comments: following commands, needing cues for sequencing ?  ?  ?   ?Exercises Other Exercises ?Other Exercises: educated on gentle rom of digits, wrist, and elbow slow and controlled focus on RUE ? ?  ?Shoulder Instructions   ? ? ?  ?General Comments    ? ? ?Pertinent Vitals/ Pain       Pain Assessment ?Faces Pain Scale: Hurts whole lot ?Pain Location: R shoulder, R  hip/thigh ?Pain Descriptors / Indicators: Discomfort, Grimacing ?Pain Intervention(s): Limited activity within patient's tolerance, Monitored during session, Premedicated before session, Repositioned ? ?Home Living   ?  ?  ?  ?  ?  ?  ?  ?  ?  ?  ?  ?  ?  ?  ?  ?  ?  ?  ? ?  ?Prior Functioning/Environment    ?  ?  ?  ?   ? ?Frequency ? Min 2X/week  ? ? ? ? ?  ?Progress Toward Goals ? ?OT Goals(current goals can now be found in the care plan section) ? Progress towards OT goals: Progressing toward goals ? ?   ?Plan Discharge plan remains appropriate   ? ?Co-evaluation ? ? ? PT/OT/SLP Co-Evaluation/Treatment: Yes ?Reason for Co-Treatment: Complexity of the patient's impairments (multi-system involvement);For patient/therapist safety ?PT goals addressed during session: Mobility/safety with mobility;Balance;Strengthening/ROM ?OT goals addressed during session: ADL's and self-care;Strengthening/ROM ?  ? ?  ?AM-PAC OT "6 Clicks" Daily Activity     ?Outcome Measure ? ? Help from another person eating meals?: A Lot ?Help from another person taking care of personal grooming?: A Lot ?Help from another person toileting, which includes using toliet, bedpan, or urinal?: Total ?Help from another person bathing (including washing, rinsing, drying)?: Total ?Help from another person to put on and taking off regular upper body clothing?: A Lot ?Help from another person to put on and taking off regular lower body clothing?: Total ?6 Click Score: 9 ? ?  ?End of Session Equipment Utilized During Treatment: Oxygen ? ?OT Visit Diagnosis: Unsteadiness on feet (R26.81);Other abnormalities of gait and mobility (R26.89);Muscle weakness (generalized) (M62.81) ?  ?Activity Tolerance Patient tolerated treatment well ?  ?Patient Left in bed;with call bell/phone within reach;with family/visitor present ?  ?Nurse Communication   ?  ? ?   ? ?Time: 4536-4680 ?OT Time Calculation (min): 23 min ? ?Charges: OT General Charges ?$OT Visit: 1 Visit ?OT  Treatments ?$Therapeutic Activity: 8-22 mins ? ?Boneta Lucks, COTA/L ?Acute Rehabilitation ?[phone]  ? ?Salvadore Oxford ?12/20/2021, 12:59 PM ?

## 2021-12-20 NOTE — Progress Notes (Signed)
Physical Therapy Treatment ?Patient Details ?Name: [name] [name] ?MRN: [id number] ?DOB: 03-13-1946 ?Today's Date: 12/20/2021 ? ? ?History of Present Illness Patient is a 76 y/o female who presents on 12/18/2021 s/p fall and found to have right clavicle fracture as well as right proximal femur fracture now s/p IM nail right femur 3/17. Found to have NSTEMI on 12/11/21. Cath Lab on 3/19 found to have single-vessel disease with heavily calcified more than 95% stenosis on her RCA, not able to be intervened upon. Cardiology following for potential further intervention vs medical management alone. PMH includes CVA, COPD, CHF, PVD, OA, osteoporosis. ? ?  ?PT Comments  ? ? Pt seen for skilled session with PT/OT. Pt received supine and agreeable to session. Pt continues to require max-total assist +2 for bed mobility and sitting balance EOB, with tendency for L lateral lean to unweight R hip and fatiguing after ~10 mins.  Pt with fair tolerance for LE therex for increased strength and ROM, with increased focus on R ankle/foot as pt continues to lack active DF and eversion and inability to bring foot to neutral. Positioned foot in boot in neutral. Current plan remains appropriate to address deficits and maximize functional independence and decrease caregiver burden. Pt continues to benefit from skilled PT to progress functional activity tolerance, ROM in RLE, core endurance, and strength for OOB transfers. ?  ?Recommendations for follow up therapy are one component of a multi-disciplinary discharge planning process, led by the attending physician.  Recommendations may be updated based on patient status, additional functional criteria and insurance authorization. ? ?Follow Up Recommendations ? Skilled nursing-short term rehab (<3 hours/day) ?  ?  ?Assistance Recommended at Discharge Frequent or constant Supervision/Assistance  ?Patient can return home with the following Two people to help with walking and/or transfers;Two people  to help with bathing/dressing/bathroom;Help with stairs or ramp for entrance;Assist for transportation;Assistance with cooking/housework ?  ?Equipment Recommendations ? None recommended by PT  ?  ?Recommendations for Other Services   ? ? ?  ?Precautions / Restrictions Precautions ?Precautions: Fall;Other (comment) ?Precaution Comments: watch BP esp after narcotics ?Required Braces or Orthoses: Sling ?Restrictions ?Weight Bearing Restrictions: Yes ?RUE Weight Bearing: Weight bearing as tolerated ?RLE Weight Bearing: Non weight bearing ?Other Position/Activity Restrictions: PRAFO for R ankle  ?  ? ?Mobility ? Bed Mobility ?Overal bed mobility: Needs Assistance ?Bed Mobility: Supine to Sit, Sit to Supine ?  ?  ?Supine to sit: Total assist, +2 for physical assistance, HOB elevated ?Sit to supine: Total assist, +2 for physical assistance ?  ?General bed mobility comments: minA to LLE, totalA to RLE, totalA of 2 for trunk. pt assisting with LUE on rail and with RLE. total A of 2 to return to supine using helicopter transfer. ?  ? ?Transfers ?  ?  ?  ?  ?  ?  ?  ?  ?  ?General transfer comment: pt declined due to pain and fatigue. would require totalA of 2 or lift ?  ? ?Ambulation/Gait ?  ?  ?  ?  ?  ?  ?  ?  ? ? ?Stairs ?  ?  ?  ?  ?  ? ? ?Wheelchair Mobility ?  ? ?Modified Rankin (Stroke Patients Only) ?  ? ? ?  ?Balance Overall balance assessment: Needs assistance ?Sitting-balance support: Feet supported, Single extremity supported ?Sitting balance-Leahy Scale: Poor ?Sitting balance - Comments: able to maintain intermittently without UE support, posterior lean with fatigue and needing verbal/tactile cues to correct,  able to tolerate ~5 mins static sitting ?Postural control: Posterior lean ?  ?  ?  ?  ?  ?  ?  ?  ?  ?  ?  ?  ?  ?  ?  ? ?  ?Cognition Arousal/Alertness: Awake/alert ?Behavior During Therapy: Flat affect ?Overall Cognitive Status: Impaired/Different from baseline ?Area of Impairment: Problem solving ?  ?  ?   ?  ?  ?  ?  ?  ?  ?  ?  ?  ?  ?  ?Problem Solving: Slow processing ?General Comments: following commands, needing cues for sequencing ?  ?  ? ?  ?Exercises General Exercises - Lower Extremity ?Ankle Circles/Pumps: AROM, Left, AAROM, Right, 10 reps, Supine, Limitations ?Ankle Circles/Pumps Limitations: no active eversion, VERY slight DF noted in R ankle, unable to achieve neutral ?Quad Sets: AROM, Left, AAROM, 10 reps, Supine ?Heel Slides: AROM, Left, AAROM, 10 reps, Supine ? ?  ?General Comments   ?  ?  ? ?Pertinent Vitals/Pain Pain Assessment ?Pain Assessment: Faces ?Faces Pain Scale: Hurts whole lot ?Pain Location: R shoulder, R hip/thigh ?Pain Descriptors / Indicators: Discomfort, Grimacing ?Pain Intervention(s): Limited activity within patient's tolerance, Monitored during session, Repositioned  ? ? ?Home Living   ?  ?  ?  ?  ?  ?  ?  ?  ?  ?   ?  ?Prior Function    ?  ?  ?   ? ?PT Goals (current goals can now be found in the care plan section) Acute Rehab PT Goals ?Patient Stated Goal: decrease pain ?PT Goal Formulation: With patient ?Time For Goal Achievement: 12/27/21 ? ?  ?Frequency ? ? ? Min 3X/week ? ? ? ?  ?PT Plan Current plan remains appropriate  ? ? ?Co-evaluation PT/OT/SLP Co-Evaluation/Treatment: Yes ?Reason for Co-Treatment: Complexity of the patient's impairments (multi-system involvement);For patient/therapist safety ?PT goals addressed during session: Mobility/safety with mobility;Balance;Strengthening/ROM ?OT goals addressed during session: ADL's and self-care;Strengthening/ROM ?  ? ?  ?AM-PAC PT "6 Clicks" Mobility   ?Outcome Measure ? Help needed turning from your back to your side while in a flat bed without using bedrails?: A Lot ?Help needed moving from lying on your back to sitting on the side of a flat bed without using bedrails?: Total ?Help needed moving to and from a bed to a chair (including a wheelchair)?: Total ?Help needed standing up from a chair using your arms (e.g., wheelchair  or bedside chair)?: Total ?Help needed to walk in hospital room?: Total ?Help needed climbing 3-5 steps with a railing? : Total ?6 Click Score: 7 ? ?  ?End of Session Equipment Utilized During Treatment: Oxygen ?Activity Tolerance: Patient limited by pain;Patient limited by fatigue ?Patient left: in bed;with call bell/phone within reach;with bed alarm set;with family/visitor present ?Nurse Communication: Mobility status ?PT Visit Diagnosis: Pain;Muscle weakness (generalized) (M62.81) ?Pain - Right/Left: Right ?Pain - part of body: Leg ?  ? ? ?Time: 1016-1040 ?PT Time Calculation (min) (ACUTE ONLY): 24 min ? ?Charges:  $Therapeutic Exercise: 8-22 mins          ?          ? ?Lenora Boys. PTA ?Acute Rehabilitation Services ?Office: [phone] ? ? ? ?Marlana Salvage Grier Czerwinski ?12/20/2021, 1:24 PM ? ?

## 2021-12-21 ENCOUNTER — Telehealth: Payer: Self-pay

## 2021-12-21 DIAGNOSIS — F331 Major depressive disorder, recurrent, moderate: Secondary | ICD-10-CM | POA: Diagnosis not present

## 2021-12-21 DIAGNOSIS — I5032 Chronic diastolic (congestive) heart failure: Secondary | ICD-10-CM | POA: Diagnosis not present

## 2021-12-21 DIAGNOSIS — J439 Emphysema, unspecified: Secondary | ICD-10-CM | POA: Diagnosis not present

## 2021-12-21 DIAGNOSIS — Z515 Encounter for palliative care: Secondary | ICD-10-CM | POA: Diagnosis not present

## 2021-12-21 DIAGNOSIS — S72141A Displaced intertrochanteric fracture of right femur, initial encounter for closed fracture: Secondary | ICD-10-CM | POA: Diagnosis not present

## 2021-12-21 MED ORDER — GLYCOPYRROLATE 0.2 MG/ML IJ SOLN
0.2000 mg | INTRAMUSCULAR | Status: DC | PRN
  Administered 2021-12-21 (×2): 0.2 mg via INTRAVENOUS
  Filled 2021-12-21 (×4): qty 1

## 2021-12-21 MED ORDER — GLYCOPYRROLATE 0.2 MG/ML IJ SOLN
0.2000 mg | INTRAMUSCULAR | Status: DC | PRN
  Administered 2021-12-21: 0.2 mg via INTRAVENOUS
  Filled 2021-12-21: qty 1

## 2021-12-21 MED ORDER — SODIUM CHLORIDE 0.9 % IV SOLN
1.0000 mg/h | INTRAVENOUS | Status: DC
  Administered 2021-12-21: 1 mg/h via INTRAVENOUS
  Filled 2021-12-21: qty 2.5

## 2021-12-21 MED ORDER — GLYCOPYRROLATE 0.2 MG/ML IJ SOLN
0.6000 mg | INTRAMUSCULAR | Status: DC
Start: 2021-12-21 — End: 2021-12-21
  Administered 2021-12-21 (×2): 0.6 mg via INTRAVENOUS
  Filled 2021-12-21 (×6): qty 3

## 2021-12-21 MED ORDER — HYDROMORPHONE BOLUS VIA INFUSION
1.0000 mg | INTRAVENOUS | Status: DC | PRN
Start: 2021-12-21 — End: 2021-12-21

## 2021-12-21 MED ORDER — GLYCOPYRROLATE 1 MG PO TABS: 1.0000 mg | ORAL_TABLET | ORAL | Status: DC | PRN

## 2021-12-21 MED ORDER — SCOPOLAMINE 1 MG/3DAYS TD PT72
1.0000 | MEDICATED_PATCH | TRANSDERMAL | Status: DC
  Filled 2021-12-21: qty 1

## 2021-12-21 MED ORDER — HYDROMORPHONE 1 MG/ML IV SOLN: INTRAVENOUS | Status: AC

## 2021-12-21 MED ORDER — GLYCOPYRROLATE 0.2 MG/ML IJ SOLN: 0.2000 mg | INTRAMUSCULAR | Status: DC | PRN

## 2021-12-22 NOTE — Telephone Encounter (Signed)
Pt's daughter came in and signed paperwork for her mother. Faxed it to [phone] ?

## 2021-12-24 ENCOUNTER — Telehealth: Payer: Self-pay | Admitting: Family Medicine

## 2021-12-24 DIAGNOSIS — M810 Age-related osteoporosis without current pathological fracture: Secondary | ICD-10-CM

## 2021-12-24 DIAGNOSIS — I10 Essential (primary) hypertension: Secondary | ICD-10-CM

## 2021-12-24 DIAGNOSIS — E782 Mixed hyperlipidemia: Secondary | ICD-10-CM

## 2021-12-24 DIAGNOSIS — I5032 Chronic diastolic (congestive) heart failure: Secondary | ICD-10-CM

## 2021-12-24 NOTE — Telephone Encounter (Signed)
Spoke with daughter [name].  ?Difficult time at end, palliative care involved in hospital.  Pt passed away 27-Dec-2022 in hospital.  ?Spoke with daughter to express my condolences.  ?

## 2021-12-25 NOTE — Telephone Encounter (Signed)
Per Minette Brine, pt's daughter, Milana Obey (on dpr), dropped off FMLA ppw to be completed to care for pt. ? ?Placed form in Dr. Synthia Innocent box.  ?

## 2021-12-25 NOTE — Telephone Encounter (Addendum)
Filled and in Lisa's box.  ?Daughter still needs to sign her section. ?

## 2021-12-25 NOTE — Consult Note (Signed)
? ?  [facility] CM Inpatient Consult ? ? ?12/16/2021 ? ?[name] [name] ?05/12/1946 ?[id number] ? 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 ?Primary Care Provider:  [name], MD, [facility], an Embedded provider ? ?Follow up: [facility] and post hospital needs ? ?Reviewed for updates and found that patient is for comfort measures. ? ?Plan:  Continue to follow and sign off when appropriate. ? ?For questions, ? ?Charlesetta Shanks, RN BSN CCM ?[facility] [employer] Liaison ? [phone] business mobile phone ?Toll free office [phone]  ?Fax number: [phone] ?[email or web address] ?[email or web address] ? ? ?

## 2021-12-25 NOTE — Progress Notes (Signed)
This chaplain responded PMT consult for EOL spiritual care. This chaplain understands the Pt. desires are to be comfortable. Family at the bedside share the Pt. has found her comfort and peace. ? ?A space of storytelling was opened by the chaplain for the daughter, grand-daughters, and son in law to celebrate the Pt. life. The family accepted the chaplain recognition of the importance of sharing the memories in their own personal grief care. ? ?The family accepted the chaplain's prayer and availability for F/U spiritual care. ? ?Chaplain Sallyanne Kuster ?[phone] ?

## 2021-12-25 NOTE — Progress Notes (Signed)
?PROGRESS NOTE ? ? ? ?[name] [name]  [id number] DOB: [birth year] DOA: 11/27/2021 ?PCP: Ria Bush, MD  ? ? ?Chief Complaint  ?Patient presents with  ? Fall  ? ? ?Brief Narrative:  ?76 year old lady with prior history of hypertension stage III CKD peripheral vascular disease comes in after a mechanical fall.  She was found to have right clavicle fracture as well as a right proximal femur fracture.  Orthopedics consulted and she underwent intramedullary fixation of the right femur,.  Hospital course complicated by postoperative NSTEMI as well as persistent acute blood loss anemia secondary to hematoma formation. ?Patient underwent urgent cath which showed heavily calcified RCA lesion was not intervened, recommended outpatient follow-up with cardiology on discharge. ?Medical management at this time from cardiac standpoint. Unable to put her on aspirin or plavix due to right groin hematoma leading to blood loss anemia. Patient continues to decline clinically, not able to participate in therapy due to severe pain in the hip , groin and shoulder, not progressing, hypotensive, requiring increased pain meds which are in turn leading to hypoxia.  ? ?After further discussions with palliative care, the patient wanted to shift to comfort care.  ? ? ?Assessment & Plan: ?  ?Principal Problem: ?  Closed intertrochanteric fracture of hip, right, initial encounter ([place]) ?Active Problems: ?  Right clavicle fracture ?  Hypertension ?  Peripheral vascular disease ([place]) ?  COPD (chronic obstructive pulmonary disease) ([place]) ?  MDD (major depressive disorder), recurrent episode, moderate ([place]) ?  Osteoporosis ?  Hypothyroidism (acquired) ?  Chronic diastolic heart failure ([place]) ?  Intertrigo ?  Stage 3a chronic kidney disease (CKD) ([place]) ?  Neuropathy ?  NSTEMI (non-ST elevated myocardial infarction) ([place]) ? ? ?NSTEMI ?Patient reported chest pain postsurgery. ?EKG showed depressed ST segments in the lateral leads. ?Troponins  were elevated.  She underwent cardiac cath was found to have a severe single-vessel disease with heavily calcified more than 95% stenosis on her RCA.  This could not be intervened upon.  Cardiology recommended follow-up as an outpatient. She is not a candidate for any invasive treatment.  ?Currently not on aspirin, Plavix or heparin due to acute blood loss anemia. ?EKG reviewed , just PVC'S. She was  on metoprolol 12.5 mg BID, and imdur 15 mg daily which have been discontinued due to hypotension. ? ? ? ?Closed intertrochanteric fracture of the right hip ?Orthopedics consulted and she was taken to the OR on 3/17 by Dr. Lyla Glassing.  She is s/p intramedullary fixation of the right femur. ?Pain control with Dilaudid PCA.  ? ? ?Acute anemia of blood loss s/p 3 units of PRBC transfusion. ?Repeat hemoglobin stable around 9.  CT of the abdomen and pelvis does not show retroperitoneal bleed.Heterogeneous lesion of the right groin, favored to be due to hematoma. However, given rounded appearance, recommend ultrasound For more definitive characterization since an enlarged lymph node could have a similar appearance. US of the right groin ordered for further evaluation.  ? ?Right lower extremity swelling-likely related to recent surgery/immobility, but ruled out DVT with a Doppler ultrasound. ? Hematoma appears to be stable. Hemoglobin stable around 9.1. and 9.2 to 9.8.  ? ? ? ? ?Chronic diastolic heart failure ?She appears to be  slightly volume overloaded at this time. Unable to diurese her due to low BP parameters.  ? ? ?Hyperlipidemia ? ? ?History of COPD ?CT chest unremarkable.  ?Requiring increased oxygen during the night due to sedating pain meds.  ? ? ?Peripheral vascular disease ?  Holding aspirin and plavix due to hematoma.  ? ? ? ?Essential hypertension ? ? ? ? ?Stage IIIa CKD ?Creatinine appears to be at baseline, . ? ? ? ?Right clavicular fracture ?Continue with the sling on the right arm ?Nonweightbearing  recommended. ? ? ? ?Right foot pain , unable to bear weight,  ?- x rays  are negative. Much improved today.  ?Uric acid levels wnl.  ? ? ?Hypothyroidism:  ?Continue with synthroid.  ? ?Thrombocytopenia:  ?Resolved.  ? ?Constipation:  ?Resolved.  ? ? ?Pt very deconditioned, poor progression, critical stenosis of RCA , not a candidate for intervention at this time, borderline low BP's, unable to anticoagulate due to hematoma, has poor prognosis.  ?Would recommend goals of care discussions with palliative care as she has not made much progress.  ?After meeting with palliative, patient and family decided to shift to comfort measures. ? ? ?DVT prophylaxis: scd's ?Code Status: DNR ?Family Communication: family at bedside.  ?Disposition:  ? ?Status is: Inpatient ?Remains inpatient appropriate because: comfort measures.  ?  ?Consultants:  ?Cardiology ?Orthopedics.  ? ?Procedures: status post intramedullary fixation of the right femur ? ?Antimicrobials:  ?Antibiotics Given (last 72 hours)   ? ? None  ? ?  ? ? ? ? ?Subjective: ?Increased secretions as per RN. She appears comfortable.  ? ?Objective: ?Vitals:  ? 12/20/21 2011 2022/01/08 0013 01/08/22 0423 01-08-22 0809  ?BP: (!) 106/55     ?Pulse: 77     ?Resp: 14 14 12 12   ?Temp: 97.7 ?F (36.5 ?C)     ?TempSrc: Oral     ?SpO2: 98% 98%  95%  ?Weight:      ?Height:      ? ? ?Intake/Output Summary (Last 24 hours) at 08-Jan-2022 1255 ?Last data filed at 01/08/22 0000 ?Gross per 24 hour  ?Intake 2.5 ml  ?Output --  ?Net 2.5 ml  ? ? ?Filed Weights  ? 11/28/2021 2100  ?Weight: 85.3 kg  ? ? ?Examination: ? ?General exam: Ill appearing lady, appears comfortable.  ?Respiratory system: diminished at bases.  ?Cardiovascular system: S1 & S2 heard, RRR.  ?Gastrointestinal system: Abdomen is nondistended, soft  ?Central nervous system: somnolent.  ?Extremities: no edema.  ?Skin: No rashes ?Psychiatry: unable to assess, sedated.  ? ? ? ? ? ?Data Reviewed: I have personally reviewed following labs  and imaging studies ? ?CBC: ?Recent Labs  ?Lab 12/15/21 ?[id number] 12/16/21 ?0304 12/17/21 ?0409 12/19/21 ?1340  ?WBC 7.8 7.3 7.3 9.1  ?NEUTROABS  --   --   --  6.3  ?HGB 9.4* 9.1* 9.2* 9.8*  ?HCT 29.0* 28.7* 29.2* 31.3*  ?MCV 89.8 90.5 92.1 92.3  ?PLT 111* 129* 161 203  ? ? ? ?Basic Metabolic Panel: ?Recent Labs  ?Lab 12/15/21 ?0434 12/17/21 ?0409 12/19/21 ?1340  ?NA 137 136 134*  ?K 4.8 4.9 4.4  ?CL 102 97* 100  ?CO2 27 32 29  ?GLUCOSE 119* 122* 154*  ?BUN 15 26* 30*  ?CREATININE 0.63 0.82 0.81  ?CALCIUM 8.4* 8.8* 8.6*  ? ? ? ?GFR: ?Estimated Creatinine Clearance: 54.3 mL/min (by C-G formula based on SCr of 0.81 mg/dL). ? ?Liver Function Tests: ?Recent Labs  ?Lab 12/15/21 ?[id number]  ?AST 46*  ?ALT 15  ?ALKPHOS 67  ?BILITOT 1.0  ?PROT 5.7*  ?ALBUMIN 2.7*  ? ? ? ?CBG: ?No results for input(s): GLUCAP in the last 168 hours. ? ? ? ?No results found for this or any previous visit (from the past 240 hour(s)). ? ?  ? ? ? ? ? ?  Radiology Studies: ?CT CHEST W CONTRAST ? ?Result Date: 12/19/2021 ?CLINICAL DATA:  Dyspnea, chronic of unclear etiology. Hypoxia requiring supplemental oxygen. EXAM: CT CHEST WITH CONTRAST TECHNIQUE: Multidetector CT imaging of the chest was performed during intravenous contrast administration. RADIATION DOSE REDUCTION: This exam was performed according to the departmental dose-optimization program which includes automated exposure control, adjustment of the mA and/or kV according to patient size and/or use of iterative reconstruction technique. CONTRAST:  48mL OMNIPAQUE IOHEXOL 350 MG/ML SOLN COMPARISON:  09/14/2021.  Chest radiograph, 12/15/2021. FINDINGS: Cardiovascular: Heart normal in size and configuration. Three-vessel coronary artery calcifications. No pericardial effusion. Aortic atherosclerosis. No dissection. Great vessels are normal in caliber. Atherosclerotic changes at the origin of the arch branch vessels. Mediastinum/Nodes: No neck base, mediastinal or hilar masses or enlarged lymph nodes.  Trachea and esophagus are unremarkable. Lungs/Pleura: Moderate centrilobular emphysema. Linear opacities at the bases of the lower lobes consistent with atelectasis, greater on the left. No evidence of pneumonia or

## 2021-12-25 NOTE — Progress Notes (Signed)
Palliative: ? ?HPI: 76 year old female with a past medical history of hypertension, PVD, CKD stage IIIa, COPD, chronic diastolic CHF.  She was admitted to the hospital on 11/24/2021 after a fall at home.  She was found to have right clavicle fracture as well as right proximal femur fracture.  She was taken to the OR on 3/17 for intramedullary fixation of the right femur.  Patient experienced chest pain on 3/18 and found to have elevated troponin concerning for NSTEMI.  She was started on heparin drip.  She underwent left heart cath today which revealed single-vessel obstructive CAD (critical 95% proximal stenosis in the RCA in an angulated segment.  This is followed by a 90% stenosis in the mid vessel.  The vessel is severely calcified.).  Cardiology planning on repeat cardiac catheterization tomorrow to consider PCI of the RCA (high risk intervention given her calcified vessel). Palliative care has been asked to get involved to further address goals of care. ? ?I met today at [name] [name]' bedside with her daughters and grandchildren. She is unresponsive. She appears to have more labored breathing with use of accessory muscles. Also with copious secretions. She is on PCA but unable to press button for bolus. Discussed with family and RN that I will transition to infusion where RN can bolus to provide her relief if needed. Also adjusted Robinul to try and better manage secretions. Discussed with family at bedside that as long as she appears comfortable (explained signs of discomfort) that the secretions are not bothering her but only difficult for [place] to hear. They express understanding. They have no other concerns. Emotional support provided. Discussed plan and management with bedside RN.  ? ?Exam: Unresponsive. Breathing labored, irregular. Abd soft. Extremities cool.  ? ?Plan: ?- Full comfort care. Anticipate hospital death.  ?- Transition from PCA to dilaudid infusion. Increase basal rate to ensure more comfort.   ?- Prognosis poor and likely hours to a day.  ? ?25 min ? ?Vinie Sill, NP ?Palliative Medicine Team ?Pager [phone] (Please see [email or web address] for schedule) ?Team Phone [phone]  ? ? ?Greater than 50%  of this time was spent counseling and coordinating care related to the above assessment and plan   ?

## 2021-12-25 NOTE — Telephone Encounter (Signed)
Spoke with pt's daughter, [name], notifying her Dr. Reece Agar has completed and signed section of the form.  However, she needs to sign and date the end of Section II, then let front desk scan entire form into pt's chart and return the original to her.  [name] verbalizes understanding and expresses her thanks.  ? ?[Placed form at front office.] ?

## 2021-12-25 DEATH — deceased

## 2022-01-11 ENCOUNTER — Telehealth: Payer: Self-pay | Admitting: Family Medicine

## 2022-01-11 NOTE — Telephone Encounter (Signed)
Can we call daughter or life insurance to find out what exactly is needed? Thanks.  ?

## 2022-01-11 NOTE — Telephone Encounter (Signed)
Deceased Pt daughter called stating that the deceased pt life insurance needs the name and address and phone number and the condition for seeing doctors for the last 5 years. Please advise. ?

## 2022-01-12 ENCOUNTER — Telehealth: Payer: Self-pay | Admitting: Family Medicine

## 2022-01-12 NOTE — Telephone Encounter (Signed)
[name] [name] dropped off letter from [employer] co.  Placed letter in Dr. Timoteo Expose box.  ?

## 2022-01-12 NOTE — Telephone Encounter (Signed)
This is just form that she needs to fill out to send to insurance company so they may formally request medical history.  ?

## 2022-01-12 NOTE — Telephone Encounter (Signed)
Spoke to patient's daughter and was advised that she has a Physicist, medical from [employer] showing specifically what they need. [name] stated that she will drop the letter off this afternoon on her way to work so that Dr. Sharen Hones will know what is needed. ?

## 2022-01-12 NOTE — Telephone Encounter (Signed)
Spoke with [facility] relaying Dr. Synthia Innocent message.  She verbalizes understanding but states pt's [place] closed pt's MyChart after she passed so Precious Bard could not get to the info she needed.  She expresses her thanks for Dr. Synthia Innocent help and will pick up form tomorrow. ? ?[Placed form at front office.] ?

## 2022-01-24 NOTE — Death Summary Note (Signed)
? ?DEATH SUMMARY  ? ?Patient Details  ?Name: [name] [name] ?MRN: [id number] ?DOB: [birth year] ?[id number], Garlon Hatchet, MD ?Admission/Discharge Information  ? ?Admit Date:  December 15, 2021  ?Date of Death: Date of Death: 2021/12/27  ?Time of Death: Time of Death: Jan 08, 1621  ?Length of Stay: 12  ? ? ? ?Hospital Diagnoses: ?Principal Problem: ?  Closed intertrochanteric fracture of hip, right, initial encounter ([place]) ?Active Problems: ?  Right clavicle fracture ?  Hypertension ?  Peripheral vascular disease ([place]) ?  COPD (chronic obstructive pulmonary disease) ([place]) ?  MDD (major depressive disorder), recurrent episode, moderate ([place]) ?  Osteoporosis ?  Hypothyroidism (acquired) ?  Chronic diastolic heart failure ([place]) ?  Intertrigo ?  Stage 3a chronic kidney disease (CKD) ([place]) ?  Neuropathy ?  NSTEMI (non-ST elevated myocardial infarction) ([place]) ? ? ? ? ?Assessment and Plan: ? ?  ?NSTEMI ?Patient reported chest pain postsurgery. ?EKG showed depressed ST segments in the lateral leads. ?Troponins were elevated.  She underwent cardiac cath was found to have a severe single-vessel disease with heavily calcified more than 95% stenosis on her RCA.  This could not be intervened upon.  Cardiology recommended follow-up as an outpatient. She is not a candidate for any invasive treatment.  ?Currently not on aspirin, Plavix or heparin due to acute blood loss anemia. ?EKG reviewed , just PVC'S. She was  on metoprolol 12.5 mg BID, and imdur 15 mg daily which have been discontinued due to hypotension. ?  ?  ?  ?Closed intertrochanteric fracture of the right hip ?Orthopedics consulted and she was taken to the OR on 3/17 by Dr. Lyla Glassing.  She is s/p intramedullary fixation of the right femur. ?Pain control with Dilaudid PCA.  ?  ?  ?Acute anemia of blood loss s/p 3 units of PRBC transfusion. ?Repeat hemoglobin stable around 9.  CT of the abdomen and pelvis does not show retroperitoneal bleed.Heterogeneous lesion of the right groin, favored to  be due to hematoma. However, given rounded appearance, recommend ultrasound For more definitive characterization since an enlarged lymph node could have a similar appearance. US of the right groin ordered for further evaluation.  ?  ?Right lower extremity swelling-likely related to recent surgery/immobility, but ruled out DVT with a Doppler ultrasound. ? Hematoma appears to be stable. Hemoglobin stable around 9.1. and 9.2 to 9.8.  ?  ?  ?  ?  ?Chronic diastolic heart failure ? ?  ?  ?Hyperlipidemia ?  ?  ?History of COPD ?CT chest unremarkable.  ?Requiring increased oxygen during the night due to sedating pain meds.  ?  ?  ?Peripheral vascular disease ?Holding aspirin and plavix due to hematoma.  ?  ?  ?  ?Essential hypertension ?  ?  ?  ?  ?Stage IIIa CKD ?Creatinine appears to be at baseline, . ?  ?  ?  ?Right clavicular fracture ?Continue with the sling on the right arm ? ?  ?  ?  ?Right foot pain , unable to bear weight,  ?- x rays  are negative. ?Uric acid levels wnl.  ? ?  ?Hypothyroidism:  ?Continue with synthroid.  ?  ?Thrombocytopenia:  ?Resolved.  ?  ?Constipation:  ?Resolved.  ?  ?  ?Pt very deconditioned, poor progression, critical stenosis of RCA , not a candidate for intervention at this time, borderline low BP's, unable to anticoagulate due to hematoma, has poor prognosis.  ?Would recommend goals of care discussions with palliative care as she has not made much progress.  ?  After meeting with palliative, patient and family decided to shift to comfort measures. ?Later on patient passed peacefully.  ?  ?  ? ? ?Procedures: cardiac cath ? ?Consultations: palliative ?Cardiology ?Orthopedics.  ?The results of significant diagnostics from this hospitalization (including imaging, microbiology, ancillary and laboratory) are listed below for reference.  ? ?Significant Diagnostic Studies: ?CT ABDOMEN PELVIS WO CONTRAST ? ?Result Date: 12/14/2021 ?CLINICAL DATA:  Abdominal pain EXAM: CT ABDOMEN AND PELVIS WITHOUT  CONTRAST TECHNIQUE: Multidetector CT imaging of the abdomen and pelvis was performed following the standard protocol without IV contrast. RADIATION DOSE REDUCTION: This exam was performed according to the departmental dose-optimization program which includes automated exposure control, adjustment of the mA and/or kV according to patient size and/or use of iterative reconstruction technique. COMPARISON:  CT abdomen and pelvis dated August 01, 2015 FINDINGS: Lower chest: Trace right pleural effusion bibasilar atelectasis. Coronary artery calcifications and aortic valvular calcifications. Hepatobiliary: No focal liver abnormality is seen. Status post cholecystectomy. No biliary dilatation. Pancreas: Unremarkable. No pancreatic ductal dilatation or surrounding inflammatory changes. Spleen: Normal in size without focal abnormality. Adrenals/Urinary Tract: Bilateral adrenal glands are unremarkable. No hydronephrosis. Renal vascular calcifications with no definite nephrolithiasis. Simple cyst of the lower pole of the right kidney. Bladder is unremarkable. Stomach/Bowel: Stomach is within normal limits. Severe sigmoid diverticulosis. No evidence of bowel wall thickening, distention, or inflammatory changes. Vascular/Lymphatic: Aortic atherosclerosis. No enlarged abdominal or pelvic lymph nodes. Reproductive: Uterus and bilateral adnexa are unremarkable. Other: Heterogeneous lesion of the right groin with areas of hyperdensity measuring 4.5 x 3.3 cm on series 8, image 76. Musculoskeletal: Intratrochanteric fracture of the right femur with postsurgical changes status post intramedullary nail placement. IMPRESSION: 1. Heterogeneous lesion of the right groin, favored to be due to hematoma. However, given rounded appearance, recommend ultrasound for more definitive characterization since an enlarged lymph node could have a similar appearance. 2. Intratrochanteric fracture of the right femur status post intramedullary nail  placement. 3. Aortic Atherosclerosis (ICD10-I70.0). Electronically Signed   By: Yetta Glassman M.D.   On: 12/14/2021 13:21  ? ?CT Head Wo Contrast ? ?Result Date: 12/01/2021 ?CLINICAL DATA:  Status post trauma. EXAM: CT HEAD WITHOUT CONTRAST TECHNIQUE: Contiguous axial images were obtained from the base of the skull through the vertex without intravenous contrast. RADIATION DOSE REDUCTION: This exam was performed according to the departmental dose-optimization program which includes automated exposure control, adjustment of the mA and/or kV according to patient size and/or use of iterative reconstruction technique. COMPARISON:  December 24, 2019 FINDINGS: Brain: There is moderate to marked severity cerebral atrophy with widening of the extra-axial spaces and ventricular dilatation. There are areas of decreased attenuation within the white matter tracts of the supratentorial brain, consistent with microvascular disease changes. Vascular: No hyperdense vessel or unexpected calcification. Skull: Normal. Negative for fracture or focal lesion. Sinuses/Orbits: No acute finding. Other: None. IMPRESSION: 1. No acute intracranial abnormality. 2. Generalized cerebral atrophy. Electronically Signed   By: Virgina Norfolk M.D.   On: 12/07/2021 19:37  ? ?CT CHEST W CONTRAST ? ?Result Date: 12/19/2021 ?CLINICAL DATA:  Dyspnea, chronic of unclear etiology. Hypoxia requiring supplemental oxygen. EXAM: CT CHEST WITH CONTRAST TECHNIQUE: Multidetector CT imaging of the chest was performed during intravenous contrast administration. RADIATION DOSE REDUCTION: This exam was performed according to the departmental dose-optimization program which includes automated exposure control, adjustment of the mA and/or kV according to patient size and/or use of iterative reconstruction technique. CONTRAST:  50mL OMNIPAQUE IOHEXOL 350 MG/ML SOLN COMPARISON:  09/14/2021.  Chest radiograph, 12/15/2021. FINDINGS: Cardiovascular: Heart normal in size and  configuration. Three-vessel coronary artery calcifications. No pericardial effusion. Aortic atherosclerosis. No dissection. Great vessels are normal in caliber. Atherosclerotic changes at the origin of the a

## 2022-03-02 ENCOUNTER — Ambulatory Visit: Payer: Medicare HMO | Admitting: Family Medicine

## 2022-03-28 ENCOUNTER — Telehealth: Payer: Self-pay | Admitting: Family Medicine

## 2022-03-28 NOTE — Telephone Encounter (Signed)
Received via certified mail [facility] record retrieval, request for medical records  Sent paperwork to [facility] HIM email to process

## 2022-04-11 NOTE — Telephone Encounter (Signed)
Error

## 2022-05-24 NOTE — Telephone Encounter (Signed)
Na

## 2022-12-02 ENCOUNTER — Telehealth: Payer: Medicare HMO
